# Patient Record
Sex: Male | Born: 1960
Health system: Southern US, Community
[De-identification: ages and names within clinical notes are randomized; demographics above are authoritative.]

## PROBLEM LIST (undated history)

## (undated) DIAGNOSIS — M069 Rheumatoid arthritis, unspecified: Secondary | ICD-10-CM

## (undated) DIAGNOSIS — I519 Heart disease, unspecified: Secondary | ICD-10-CM

## (undated) DIAGNOSIS — K219 Gastro-esophageal reflux disease without esophagitis: Secondary | ICD-10-CM

## (undated) DIAGNOSIS — I739 Peripheral vascular disease, unspecified: Secondary | ICD-10-CM

## (undated) DIAGNOSIS — Z9989 Dependence on other enabling machines and devices: Secondary | ICD-10-CM

## (undated) DIAGNOSIS — I951 Orthostatic hypotension: Secondary | ICD-10-CM

## (undated) DIAGNOSIS — I610 Nontraumatic intracerebral hemorrhage in hemisphere, subcortical: Secondary | ICD-10-CM

## (undated) DIAGNOSIS — R159 Full incontinence of feces: Secondary | ICD-10-CM

## (undated) DIAGNOSIS — N400 Enlarged prostate without lower urinary tract symptoms: Secondary | ICD-10-CM

## (undated) DIAGNOSIS — Z931 Gastrostomy status: Secondary | ICD-10-CM

## (undated) DIAGNOSIS — E46 Unspecified protein-calorie malnutrition: Secondary | ICD-10-CM

## (undated) DIAGNOSIS — J9600 Acute respiratory failure, unspecified whether with hypoxia or hypercapnia: Secondary | ICD-10-CM

## (undated) DIAGNOSIS — E785 Hyperlipidemia, unspecified: Secondary | ICD-10-CM

## (undated) DIAGNOSIS — I10 Essential (primary) hypertension: Secondary | ICD-10-CM

## (undated) DIAGNOSIS — W19XXXA Unspecified fall, initial encounter: Secondary | ICD-10-CM

## (undated) DIAGNOSIS — I82409 Acute embolism and thrombosis of unspecified deep veins of unspecified lower extremity: Secondary | ICD-10-CM

## (undated) DIAGNOSIS — G4733 Obstructive sleep apnea (adult) (pediatric): Secondary | ICD-10-CM

## (undated) DIAGNOSIS — R600 Localized edema: Secondary | ICD-10-CM

## (undated) DIAGNOSIS — Z9119 Patient's noncompliance with other medical treatment and regimen: Secondary | ICD-10-CM

## (undated) DIAGNOSIS — E119 Type 2 diabetes mellitus without complications: Secondary | ICD-10-CM

## (undated) DIAGNOSIS — G459 Transient cerebral ischemic attack, unspecified: Secondary | ICD-10-CM

## (undated) DIAGNOSIS — I779 Disorder of arteries and arterioles, unspecified: Secondary | ICD-10-CM

## (undated) DIAGNOSIS — R296 Repeated falls: Secondary | ICD-10-CM

## (undated) DIAGNOSIS — D529 Folate deficiency anemia, unspecified: Secondary | ICD-10-CM

## (undated) DIAGNOSIS — J156 Pneumonia due to other aerobic Gram-negative bacteria: Secondary | ICD-10-CM

## (undated) DIAGNOSIS — I639 Cerebral infarction, unspecified: Secondary | ICD-10-CM

## (undated) DIAGNOSIS — Z87442 Personal history of urinary calculi: Secondary | ICD-10-CM

## (undated) DIAGNOSIS — Z91199 Patient's noncompliance with other medical treatment and regimen due to unspecified reason: Secondary | ICD-10-CM

## (undated) DIAGNOSIS — G919 Hydrocephalus, unspecified: Secondary | ICD-10-CM

## (undated) DIAGNOSIS — I2510 Atherosclerotic heart disease of native coronary artery without angina pectoris: Secondary | ICD-10-CM

## (undated) DIAGNOSIS — Q211 Atrial septal defect: Secondary | ICD-10-CM

## (undated) DIAGNOSIS — M1990 Unspecified osteoarthritis, unspecified site: Secondary | ICD-10-CM

## (undated) DIAGNOSIS — R32 Unspecified urinary incontinence: Secondary | ICD-10-CM

## (undated) DIAGNOSIS — Q2112 Patent foramen ovale: Secondary | ICD-10-CM

## (undated) HISTORY — DX: Localized edema: R60.0

## (undated) HISTORY — DX: Hydrocephalus, unspecified: G91.9

## (undated) HISTORY — DX: Benign prostatic hyperplasia without lower urinary tract symptoms: N40.0

## (undated) HISTORY — DX: Essential (primary) hypertension: I10

## (undated) HISTORY — DX: Folate deficiency anemia, unspecified: D52.9

## (undated) HISTORY — DX: Unspecified protein-calorie malnutrition: E46

## (undated) HISTORY — DX: Gastro-esophageal reflux disease without esophagitis: K21.9

## (undated) HISTORY — DX: Patient's noncompliance with other medical treatment and regimen due to unspecified reason: Z91.199

## (undated) HISTORY — DX: Atherosclerotic heart disease of native coronary artery without angina pectoris: I25.10

## (undated) HISTORY — DX: Orthostatic hypotension: I95.1

## (undated) HISTORY — DX: Patent foramen ovale: Q21.12

## (undated) HISTORY — DX: Gastrostomy status: Z93.1

## (undated) HISTORY — DX: Cerebral infarction, unspecified: I63.9

## (undated) HISTORY — DX: Peripheral vascular disease, unspecified: I73.9

## (undated) HISTORY — DX: Patient's noncompliance with other medical treatment and regimen: Z91.19

## (undated) HISTORY — DX: Disorder of arteries and arterioles, unspecified: I77.9

## (undated) HISTORY — DX: Pneumonia due to other gram-negative bacteria: J15.6

## (undated) HISTORY — DX: Atrial septal defect: Q21.1

## (undated) HISTORY — DX: Hyperlipidemia, unspecified: E78.5

## (undated) HISTORY — PX: OTHER SURGICAL HISTORY: SHX169

## (undated) HISTORY — DX: Nontraumatic intracerebral hemorrhage in hemisphere, subcortical: I61.0

## (undated) HISTORY — DX: Heart disease, unspecified: I51.9

## (undated) HISTORY — DX: Transient cerebral ischemic attack, unspecified: G45.9

## (undated) HISTORY — DX: Acute respiratory failure, unspecified whether with hypoxia or hypercapnia: J96.00

---

## 1998-08-08 ENCOUNTER — Encounter: Admission: RE | Admit: 1998-08-08 | Discharge: 1998-08-08 | Payer: Self-pay | Admitting: *Deleted

## 1999-05-09 ENCOUNTER — Ambulatory Visit: Admission: RE | Admit: 1999-05-09 | Discharge: 1999-05-09 | Payer: Self-pay | Admitting: Family Medicine

## 1999-05-10 ENCOUNTER — Ambulatory Visit (HOSPITAL_COMMUNITY): Admission: RE | Admit: 1999-05-10 | Discharge: 1999-05-10 | Payer: Self-pay | Admitting: Family Medicine

## 1999-08-11 HISTORY — PX: KNEE ARTHROSCOPY: SUR90

## 1999-09-10 ENCOUNTER — Emergency Department (HOSPITAL_COMMUNITY): Admission: EM | Admit: 1999-09-10 | Discharge: 1999-09-10 | Payer: Self-pay

## 1999-09-10 ENCOUNTER — Encounter: Payer: Self-pay | Admitting: Emergency Medicine

## 1999-09-26 ENCOUNTER — Ambulatory Visit (HOSPITAL_COMMUNITY): Admission: RE | Admit: 1999-09-26 | Discharge: 1999-09-26 | Payer: Self-pay | Admitting: Diagnostic Radiology

## 1999-09-26 ENCOUNTER — Encounter: Payer: Self-pay | Admitting: Family Medicine

## 2000-01-24 ENCOUNTER — Emergency Department (HOSPITAL_COMMUNITY): Admission: EM | Admit: 2000-01-24 | Discharge: 2000-01-25 | Payer: Self-pay | Admitting: Emergency Medicine

## 2000-01-24 ENCOUNTER — Encounter: Payer: Self-pay | Admitting: Emergency Medicine

## 2000-01-25 ENCOUNTER — Encounter: Payer: Self-pay | Admitting: Emergency Medicine

## 2000-02-22 ENCOUNTER — Encounter: Payer: Self-pay | Admitting: Family Medicine

## 2000-02-22 ENCOUNTER — Ambulatory Visit (HOSPITAL_COMMUNITY): Admission: RE | Admit: 2000-02-22 | Discharge: 2000-02-22 | Payer: Self-pay | Admitting: Family Medicine

## 2001-03-17 ENCOUNTER — Encounter: Payer: Self-pay | Admitting: *Deleted

## 2001-03-17 ENCOUNTER — Ambulatory Visit (HOSPITAL_COMMUNITY): Admission: RE | Admit: 2001-03-17 | Discharge: 2001-03-17 | Payer: Self-pay | Admitting: *Deleted

## 2004-01-25 ENCOUNTER — Ambulatory Visit (HOSPITAL_COMMUNITY): Admission: RE | Admit: 2004-01-25 | Discharge: 2004-01-25 | Payer: Self-pay | Admitting: Orthopaedic Surgery

## 2004-01-25 ENCOUNTER — Ambulatory Visit (HOSPITAL_BASED_OUTPATIENT_CLINIC_OR_DEPARTMENT_OTHER): Admission: RE | Admit: 2004-01-25 | Discharge: 2004-01-25 | Payer: Self-pay | Admitting: Orthopaedic Surgery

## 2004-01-26 ENCOUNTER — Emergency Department (HOSPITAL_COMMUNITY): Admission: EM | Admit: 2004-01-26 | Discharge: 2004-01-26 | Payer: Self-pay | Admitting: *Deleted

## 2004-06-13 ENCOUNTER — Observation Stay (HOSPITAL_COMMUNITY): Admission: AD | Admit: 2004-06-13 | Discharge: 2004-06-15 | Payer: Self-pay | Admitting: *Deleted

## 2006-10-01 ENCOUNTER — Emergency Department (HOSPITAL_COMMUNITY): Admission: EM | Admit: 2006-10-01 | Discharge: 2006-10-01 | Payer: Self-pay | Admitting: Emergency Medicine

## 2007-09-02 ENCOUNTER — Emergency Department (HOSPITAL_COMMUNITY): Admission: EM | Admit: 2007-09-02 | Discharge: 2007-09-02 | Payer: Self-pay | Admitting: Emergency Medicine

## 2008-08-06 ENCOUNTER — Emergency Department (HOSPITAL_COMMUNITY): Admission: EM | Admit: 2008-08-06 | Discharge: 2008-08-06 | Payer: Self-pay | Admitting: Emergency Medicine

## 2010-01-11 ENCOUNTER — Encounter: Payer: Self-pay | Admitting: Cardiovascular Disease

## 2010-01-16 ENCOUNTER — Encounter: Payer: Self-pay | Admitting: Cardiovascular Disease

## 2010-01-20 ENCOUNTER — Encounter: Payer: Self-pay | Admitting: Cardiovascular Disease

## 2010-01-23 DIAGNOSIS — R0602 Shortness of breath: Secondary | ICD-10-CM | POA: Insufficient documentation

## 2010-01-23 DIAGNOSIS — E785 Hyperlipidemia, unspecified: Secondary | ICD-10-CM | POA: Insufficient documentation

## 2010-01-23 DIAGNOSIS — R079 Chest pain, unspecified: Secondary | ICD-10-CM | POA: Insufficient documentation

## 2010-01-23 DIAGNOSIS — K219 Gastro-esophageal reflux disease without esophagitis: Secondary | ICD-10-CM | POA: Insufficient documentation

## 2010-01-23 DIAGNOSIS — H538 Other visual disturbances: Secondary | ICD-10-CM | POA: Insufficient documentation

## 2010-01-23 DIAGNOSIS — R6884 Jaw pain: Secondary | ICD-10-CM | POA: Insufficient documentation

## 2010-01-23 DIAGNOSIS — M2550 Pain in unspecified joint: Secondary | ICD-10-CM | POA: Insufficient documentation

## 2010-01-26 ENCOUNTER — Ambulatory Visit: Payer: Self-pay | Admitting: Cardiovascular Disease

## 2010-01-26 DIAGNOSIS — F17200 Nicotine dependence, unspecified, uncomplicated: Secondary | ICD-10-CM | POA: Insufficient documentation

## 2010-01-26 DIAGNOSIS — I2510 Atherosclerotic heart disease of native coronary artery without angina pectoris: Secondary | ICD-10-CM | POA: Insufficient documentation

## 2010-02-07 ENCOUNTER — Telehealth (INDEPENDENT_AMBULATORY_CARE_PROVIDER_SITE_OTHER): Payer: Self-pay | Admitting: *Deleted

## 2010-02-07 DIAGNOSIS — I639 Cerebral infarction, unspecified: Secondary | ICD-10-CM

## 2010-02-07 HISTORY — DX: Cerebral infarction, unspecified: I63.9

## 2010-02-08 ENCOUNTER — Ambulatory Visit: Payer: Self-pay | Admitting: Cardiovascular Disease

## 2010-02-08 ENCOUNTER — Ambulatory Visit: Payer: Self-pay | Admitting: Cardiology

## 2010-02-08 ENCOUNTER — Ambulatory Visit: Payer: Self-pay

## 2010-02-08 ENCOUNTER — Encounter (HOSPITAL_COMMUNITY): Admission: RE | Admit: 2010-02-08 | Discharge: 2010-04-11 | Payer: Self-pay | Admitting: Cardiovascular Disease

## 2010-02-08 ENCOUNTER — Encounter: Payer: Self-pay | Admitting: Cardiovascular Disease

## 2010-02-08 ENCOUNTER — Ambulatory Visit (HOSPITAL_COMMUNITY): Admission: RE | Admit: 2010-02-08 | Discharge: 2010-02-08 | Payer: Self-pay | Admitting: Cardiovascular Disease

## 2010-02-08 DIAGNOSIS — Z8673 Personal history of transient ischemic attack (TIA), and cerebral infarction without residual deficits: Secondary | ICD-10-CM | POA: Insufficient documentation

## 2010-02-09 ENCOUNTER — Encounter: Admission: RE | Admit: 2010-02-09 | Discharge: 2010-02-09 | Payer: Self-pay | Admitting: Family Medicine

## 2010-02-28 ENCOUNTER — Encounter: Admission: RE | Admit: 2010-02-28 | Discharge: 2010-04-19 | Payer: Self-pay | Admitting: Family Medicine

## 2010-03-13 ENCOUNTER — Ambulatory Visit: Payer: Self-pay | Admitting: Cardiovascular Disease

## 2010-03-13 DIAGNOSIS — Q211 Atrial septal defect: Secondary | ICD-10-CM | POA: Insufficient documentation

## 2010-08-25 ENCOUNTER — Encounter: Payer: Self-pay | Admitting: Cardiovascular Disease

## 2010-09-13 ENCOUNTER — Ambulatory Visit: Payer: Self-pay | Admitting: Cardiovascular Disease

## 2010-09-21 ENCOUNTER — Inpatient Hospital Stay (HOSPITAL_BASED_OUTPATIENT_CLINIC_OR_DEPARTMENT_OTHER): Admission: RE | Admit: 2010-09-21 | Discharge: 2010-09-21 | Payer: Self-pay | Admitting: Cardiovascular Disease

## 2010-09-21 ENCOUNTER — Ambulatory Visit: Payer: Self-pay | Admitting: Cardiovascular Disease

## 2010-10-06 ENCOUNTER — Telehealth: Payer: Self-pay | Admitting: Cardiovascular Disease

## 2010-10-09 ENCOUNTER — Ambulatory Visit: Payer: Self-pay | Admitting: Cardiovascular Disease

## 2010-10-10 LAB — CONVERTED CEMR LAB
Basophils Relative: 0.4 % (ref 0.0–3.0)
CO2: 29 meq/L (ref 19–32)
Chloride: 97 meq/L (ref 96–112)
Eosinophils Absolute: 0.3 10*3/uL (ref 0.0–0.7)
Hemoglobin: 14.6 g/dL (ref 13.0–17.0)
Lymphocytes Relative: 20 % (ref 12.0–46.0)
MCHC: 34.4 g/dL (ref 30.0–36.0)
MCV: 89.9 fL (ref 78.0–100.0)
Monocytes Absolute: 0.5 10*3/uL (ref 0.1–1.0)
Neutro Abs: 6.6 10*3/uL (ref 1.4–7.7)
RBC: 4.73 M/uL (ref 4.22–5.81)
Sodium: 142 meq/L (ref 135–145)

## 2010-10-13 ENCOUNTER — Ambulatory Visit (HOSPITAL_COMMUNITY): Admission: RE | Admit: 2010-10-13 | Discharge: 2010-10-14 | Payer: Self-pay | Admitting: Cardiovascular Disease

## 2010-10-13 ENCOUNTER — Ambulatory Visit: Payer: Self-pay | Admitting: Cardiovascular Disease

## 2010-10-17 ENCOUNTER — Ambulatory Visit: Payer: Self-pay | Admitting: Cardiovascular Disease

## 2010-10-17 DIAGNOSIS — E876 Hypokalemia: Secondary | ICD-10-CM | POA: Insufficient documentation

## 2010-10-17 LAB — CONVERTED CEMR LAB
CO2: 30 meq/L (ref 19–32)
Calcium: 9.4 mg/dL (ref 8.4–10.5)
Chloride: 104 meq/L (ref 96–112)
Glucose, Bld: 101 mg/dL — ABNORMAL HIGH (ref 70–99)
Sodium: 142 meq/L (ref 135–145)

## 2010-10-26 ENCOUNTER — Ambulatory Visit: Payer: Self-pay | Admitting: Cardiovascular Disease

## 2010-12-10 HISTORY — PX: CORONARY ANGIOPLASTY WITH STENT PLACEMENT: SHX49

## 2011-01-07 LAB — CONVERTED CEMR LAB
Basophils Relative: 0.4 % (ref 0.0–3.0)
Calcium: 9.6 mg/dL (ref 8.4–10.5)
Chloride: 104 meq/L (ref 96–112)
Creatinine, Ser: 1.1 mg/dL (ref 0.4–1.5)
Eosinophils Relative: 1.6 % (ref 0.0–5.0)
INR: 1.1 — ABNORMAL HIGH (ref 0.8–1.0)
Lymphocytes Relative: 21.8 % (ref 12.0–46.0)
Neutrophils Relative %: 71.5 % (ref 43.0–77.0)
RBC: 4.7 M/uL (ref 4.22–5.81)
WBC: 10.6 10*3/uL — ABNORMAL HIGH (ref 4.5–10.5)

## 2011-01-09 NOTE — Letter (Signed)
Summary: Cardiac Catheterization Instructions- [facility]  [employer], Main Office  [address]   [place], [place] [address]   Phone: [phone]  Fax: [phone]     09/13/2010 MRN: [id number]  Tony Davis [address] [place], [place]  [address]  Dear [name],   You are scheduled for a Cardiac Catheterization on 10/13/11_with Dr. Clifton James.  Please arrive to the 1st floor of the Heart and Vascular Center at [facility] at 6:30am on the day of your procedure. Please do not arrive before 6:30 a.m. Call the Heart and Vascular Center at [phone] if you are unable to make your appointmnet. The Code to get into the parking garage under the building is 0200. Take the elevators to the 1st floor. You must have someone to drive you home. Someone must be with you for the first 24 hours after you arrive home. Please wear clothes that are easy to get on and off and wear slip-on shoes. Do not eat or drink after midnight except water with your medications that morning. Bring all your medications and current insurance cards with you.    __X_ Make sure you take your aspirin.  __X_ You may take ALL of your medications with water that morning. ________________________________________________________________________________________________________________________________  __X_ Eilene Ghazi instructions:  PLEASE TAKE 3 PILLS OF PREDNISONE AT NOON THE DAY BEFORE YOUR PROCEDURE AND TAKE 3 MORE THE EVENING BEFORE YOUR PROCEDURE. ALSO TAKE 3 MORE PILLS THE MORNING OF THE PROCEDURE.  The usual length of stay after your procedure is 2 to 3 hours. This can vary.  If you have any questions, please call the office at the number listed above.   Whitney Maeola Sarah RN

## 2011-01-09 NOTE — Assessment & Plan Note (Signed)
Summary: eph/wpa   Visit Type:  Follow-up Primary Provider:  Noel Journey  CC:  no complaints.  History of Present Illness: 50 yo male with history of CAD, HTN, hyperlipidemia, impaired fasting glucose and obesity who is here today for follow up. I saw him in February 2011 for complaints of chest pain.   I arranged a stress test, an echo and carotid dopplers at the first visit. He missed his follow up appt. The carotid dopplers were normal. He came in for his stress test and complained of neurological changes. He was seen by Dr. Riley Kill and the stress test was cancelled. He was sent over to Dr. Stones office and had MRI/MRA of the head and neck. MRA showed no occlusive disease. MRI showed non-specific white matter changes but no clear infarct, possibly related to hypertension. His echo shows septal and inferobasal hypokinesis with mild concentric hypertrophy, EF 45-50%. There was a possible small PFO.   He was seen here last month and was still having chest pains. I arranged a left heart cath in the outpatient lab. He was found to have a stenosis in the mid Circumflex. He told us about dark stools the day before the cath. The intervention was delayed pending a GI workup. He has had no further issues with dark stools at follow up 2 weeks ago. He was brought back in 10/13/10 for planned PCI of the Circumflex. A Promus drug eluting stent was placed without any complications.  He is doing great. No chest pain-completely resolved. No SOB, dizziness. His right arm feels ok at the cath site.   Current Medications (verified): 1)  Losartan Potassium 100 Mg Tabs (Losartan Potassium) .Marland Kitchen.. 1 Tab Once Daily 2)  Flomax 0.4 Mg Caps (Tamsulosin Hcl) .Marland Kitchen.. 1 Cap Once Daily 3)  Nitrostat 0.4 Mg Subl (Nitroglycerin) .Marland Kitchen.. 1 Tablet Under Tongue At Onset of Chest Pain; You May Repeat Every 5 Minutes For Up To 3 Doses. 4)  Amlodipine Besylate 5 Mg Tabs (Amlodipine Besylate) .... Take One Tablet By Mouth Daily 5)  Aspirin  Ec 325 Mg Tbec (Aspirin) .... Take One Tablet By Mouth Daily 6)  Simvastatin 20 Mg Tabs (Simvastatin) .... Take One Tablet By Mouth Daily At Bedtime 7)  Plavix 75 Mg Tabs (Clopidogrel Bisulfate) .... Take One Tablet By Mouth Daily 8)  Potassium Chloride Crys Cr 20 Meq Cr-Tabs (Potassium Chloride Crys Cr) .... Take One Tablet By Mouth Two Times A Day. 9)  Metoprolol Tartrate 25 Mg Tabs (Metoprolol Tartrate) .... Take One Tablet By Mouth Twice A Day  Allergies: 1)  ! Iodine  Past History:  Past Medical History: CAD with  cath 10/11 with mild LAD, 80% mid Circ, PLA 70%.  No s/p Promus DES Circumflex artery 10/13/10.  GERD (ICD-530.81) HYPERLIPIDEMIA (ICD-272.4) HYPERTENSION (ICD-401.9) Borderline DM Tobacco abuse CVA/TIA 3/11        Social History: Reviewed history from 09/13/2010 and no changes required. Tobacco Use - Yes. 1/2 ppd for 30 years. 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  Review of Systems  The patient denies fatigue, malaise, fever, weight gain/loss, vision loss, decreased hearing, hoarseness, chest pain, palpitations, shortness of breath, prolonged cough, wheezing, sleep apnea, coughing up blood, abdominal pain, blood in stool, nausea, vomiting, diarrhea, heartburn, incontinence, blood in urine, muscle weakness, joint pain, leg swelling, rash, skin lesions, headache, fainting, dizziness, depression, anxiety, enlarged lymph nodes, easy bruising  or bleeding, and environmental allergies.    Vital Signs:  Patient profile:   50 year old male Height:      71 inches Weight:      237 pounds Pulse rate:   84 / minute Pulse rhythm:   regular BP sitting:   132 / 100  (left arm)  Vitals Entered By: Jacquelin Hawking, CMA (October 26, 2010 1:47 PM)  Physical Exam  General:  General: Well developed, well nourished, NAD Musculoskeletal: Muscle strength 5/5 all ext Psychiatric: Mood and  affect normal Neck: No JVD, no carotid bruits, no thyromegaly, no lymphadenopathy. Lungs:Clear bilaterally, no wheezes, rhonci, crackles CV: RRR no murmurs, gallops rubs Abdomen: soft, NT, ND, BS present Extremities: No edema, pulses 2+.    Cardiac Cath  Procedure date:  09/21/2010  Findings:      1. Left main coronary artery had no evidence of disease. 2. The left anterior descending was large vessel that coursed around     the anterior wall and did not wrap around the apex.  There appeared     to be mild plaque disease in this vessel.  There was a moderate-     sized diagonal branch with mild plaque disease. 3. Circumflex artery gave off an early marginal branch that     represented an intermediate branch.  There was no disease in this     vessel.  The next marginal branch was relatively small in caliber     and had no disease.  The second obtuse marginal branch was moderate-     sized and had 30% plaque.  After the takeoff of the second marginal     branch, there was 80% hazy stenosis in the mid circumflex vessel.     Beyond this, there was third obtuse marginal branch with plaque     disease. 4. The right coronary artery is a large dominant vessel that wraps     around the apex of the left ventricle.  There is mild 25% stenosis     in the proximal midportion of the vessel.  The posterior descending     artery is a large-caliber vessel with mild plaque disease.  The     posterolateral branch has a proximal 70% stenosis and the mid 40%     stenosis.  This is unchanged from the catheterization in 2005. 5. Left ventricular angiogram was performed in the RAO projection,     which showed normal left ventricular systolic function with     ejection fraction of 60%.  PCI 10/13/10 with 3.5 x 15 mm Promus DES Circumflex. Post-dilatation with 3.75 mm [place] balloon.   Impression & Recommendations:  Problem # 1:  CAD, NATIVE VESSEL (ICD-414.01) Stable post drug eluting stent in  Circumflex artery. Continue ASA/Plavix for at least one year. Continue beta blocker and statin.   His updated medication list for this problem includes:    Nitrostat 0.4 Mg Subl (Nitroglycerin) .Marland Kitchen... 1 tablet under tongue at onset of chest pain; you may repeat every 5 minutes for up to 3 doses.    Amlodipine Besylate 5 Mg Tabs (Amlodipine besylate) .Marland Kitchen... Take one tablet by mouth daily    Aspirin Ec 325 Mg Tbec (Aspirin) .Marland Kitchen... Take one tablet by mouth daily    Plavix 75 Mg Tabs (Clopidogrel bisulfate) .Marland Kitchen... Take one tablet by mouth daily    Metoprolol Tartrate 25 Mg Tabs (Metoprolol tartrate) .Marland Kitchen... Take one tablet by mouth twice a day  Patient Instructions: 1)  Your physician  recommends that you schedule a follow-up appointment in: 6 months 2)  Your physician recommends that you continue on your current medications as directed. Please refer to the Current Medication list given to you today.

## 2011-01-09 NOTE — Progress Notes (Signed)
Summary: Family Medicine Med List  Family Medicine Med List   Imported By: Roderic Ovens 04/03/2010 13:58:22  _____________________________________________________________________  External Attachment:    Type:   Image     Comment:   External Document

## 2011-01-09 NOTE — Assessment & Plan Note (Signed)
Summary: possible TIA symptom   Visit Type:  DOD ADD-ON Primary Provider:  Noel Journey  CC:  pt had nuc study today and was added on to Dr. Louretta Shorten Schedule for stroke changes...visual changes and dizziness.  History of Present Illness: The patient was scheduled by Dr. Clifton James to have a nuclear and echo today.  On "Sunday, the patient was  on the way to [place], and he noticed as his wife was driving, he lost vision in one field.  He could not see in the field.  He could not read the car tags.  Things were blank.  This lasted about twenty to thirty minutes.  He got to [employer], and felt disoriented.  He could not see them until they came in from the left side.  Then he felt disoriented after a while.  Now he feels better.  BP was elevated and he was placed on medication with Dr. Stone.  His chest pain is improved, and his BP has been down quite a bit.     Current Medications (verified): 1)  Losartan Potassium 100 Mg Tabs (Losartan Potassium) .... 1 Tab Once Daily 2)  Flomax 0.4 Mg Caps (Tamsulosin Hcl) .... 1 Cap Once Daily 3)  Nitrostat 0.4 Mg Subl (Nitroglycerin) .... 1 Tablet Under Tongue At Onset of Chest Pain; You May Repeat Every 5 Minutes For Up To 3 Doses. 4)  Amlodipine Besylate 10 Mg Tabs (Amlodipine Besylate) .... 1 Tab Once Daily 5)  Aspirin 81 Mg Tbec (Aspirin) .... Take One Tablet By Mouth Daily  Allergies: 1)  ! Iodine  Vital Signs:  Patient profile:   50 year old male Height:      71 inches Weight:      236 pounds BMI:     33" .03 Pulse rate:   100 / minute Pulse rhythm:   regular BP sitting:   110 / 90  (left arm) Cuff size:   large  Vitals Entered By: Danielle Rankin, CMA (February 08, 2010 4:18 PM)  Physical Exam  General:  Well developed, well nourished, in no acute distress. Head:  normocephalic and atraumatic Eyes:  PERRLA/EOM intact; conjunctiva and lids normal. Nose:  no deformity, discharge, inflammation, or lesions Lungs:  Clear bilaterally to auscultation  and percussion. Heart:  PMI non displaced.  Normal S1 and S2.  Pos S4.  No def murmur. Abdomen:  Bowel sounds positive; abdomen soft and non-tender without masses, organomegaly, or hernias noted. No hepatosplenomegaly. Msk:  Back normal, normal gait. Muscle strength and tone normal. Pulses:  pulses normal in all 4 extremities Extremities:  No clubbing or cyanosis. Neurologic:  No def defect with field testing.  CN intact. Weakness in hand 3/5.  Mod drift demonstrated to son and wife (L hand).  Slight reduce sensation L arm and leg.  Clumsiness with Finger to nose with left hand.   Skin:  Intact without lesions or rashes.   EKG  Procedure date:  02/08/2010  Findings:      NSR. WNL.    Impression & Recommendations:  Problem # 1:  CVA (ICD-434.91) suspect R brain stroke.  Had left field cut, resolved, and then left weakness, drift on exam;  spoke with Dr. Ace Gins, and they will arrange an OP MRI tomorrow and decide on dosing of meds.  Carotids done today--prelim--with critical disease, and echo pending.  She will arrange MRI, follow up office visit, adjust ASA appropriately based on findings.    Defer cardiac workup with followup with Dr.  Mcalhany next week.  Instructed not to drive until above issues resolved and addressed.  May need neuro eval. His updated medication list for this problem includes:    Aspirin 81 Mg Tbec (Aspirin) .Marland Kitchen... Take one tablet by mouth daily  Problem # 2:  HYPERTENSION (ICD-401.9) BP seems quite low   --?? watershed CVA----therefore dose decreased to amlodipine 5mg  daily until he sees Dr. Larina Bras back.   His updated medication list for this problem includes:    Losartan Potassium 100 Mg Tabs (Losartan potassium) .Marland Kitchen... 1 tab once daily    Amlodipine Besylate 5 Mg Tabs (Amlodipine besylate) .Marland Kitchen... Take one tablet by mouth daily    Aspirin 81 Mg Tbec (Aspirin) .Marland Kitchen... Take one tablet by mouth daily  Problem # 3:  CHEST PAIN (ICD-786.50) Needs workup.  Will defer  until MRI done, and have him follow up with Dr. Clifton James.  Dr. Larina Bras called. His updated medication list for this problem includes:    Nitrostat 0.4 Mg Subl (Nitroglycerin) .Marland Kitchen... 1 tablet under tongue at onset of chest pain; you may repeat every 5 minutes for up to 3 doses.    Amlodipine Besylate 5 Mg Tabs (Amlodipine besylate) .Marland Kitchen... Take one tablet by mouth daily    Aspirin 81 Mg Tbec (Aspirin) .Marland Kitchen... Take one tablet by mouth daily  Problem # 4:  HYPERLIPIDEMIA (ICD-272.4) Prob should be on statin.  See 2005 cath study.  Problem # 5:  TOBACCO ABUSE (ICD-305.1) should stop.  Patient Instructions: 1)  Dr Evon Slack office will call your home tomorrow morning at 8:00 AM to arrange an office visit and further testing.   2)  Do Not Drive. 3)  Your physician recommends that you schedule a follow-up appointment in: 1 WEEK with Dr Clifton James 4)  Your physician has recommended you make the following change in your medication: DECREASE Amlodipine to 5mg  once a day Prescriptions: AMLODIPINE BESYLATE 5 MG TABS (AMLODIPINE BESYLATE) Take one tablet by mouth daily  #30 x 2   Entered by:   Julieta Gutting, RN, BSN   Authorized by:   Ronaldo Miyamoto, MD, [facility]   Signed by:   Julieta Gutting, RN, BSN on 02/08/2010   Method used:   Electronically to        [id number] (ret[facility])       [address]       [place], [place]  [address]       Ph: [phone]       Fax: [phone]   RxID:   [id number]

## 2011-01-09 NOTE — Letter (Signed)
Summary: Family Medicine at [facility] Note  Family Medicine at [facility] Note   Imported By: Roderic Ovens 04/03/2010 14:01:28  _____________________________________________________________________  External Attachment:    Type:   Image     Comment:   External Document

## 2011-01-09 NOTE — Miscellaneous (Signed)
Summary: Orders Update  Clinical Lists Changes  Orders: Added new Test order of Carotid Duplex (Carotid Duplex) - Signed 

## 2011-01-09 NOTE — Assessment & Plan Note (Signed)
Summary: rov/abnormal echo/pla   Visit Type:  Follow-up Primary Provider:  Noel Journey  CC:  sluggish/fatigue..pt c/o back pain between scalpulas says this is everyday...denis any sob or edema.  History of Present Illness: 50 yo male with history of non-obstrucitve CAD by cath 2005, HTN, hyperlipidemia, impaired fasting glucose and obesity who is here today for follow up. I saw him in February 2011 for complaints of chest pain.   It was described as a heavy pressure in the center of his chest with exertion, associated with SOB. Localized mostly to the center of his chest without radiation. Lasted for several minutes. I arranged a stress test, an echo and carotid dopplers at the first visit. He missed his follow up appt. The carotid dopplers were normal. He came in for his stress test and complained of neurological changes. He was seen by Dr. Riley Kill and the stress test was cancelled. He was sent over to Dr. Stones office and had MRI/MRA of the head and neck. MRA showed no occlusive disease. MRI showed non-specific white matter changes but no clear infarct, possibly related to hypertension. His echo shows septal and inferobasal hypokinesis with mild concentric hypertrophy, EF 45-50%. There is a possible small PFO.   He tells me today that he has had continued episodes of upper left back pain.The pain in his back lasts for 4-6 hours and is worsened by movement and is painful to touch.  He has occasional "heaviness" in his chest but this is not exertional. No exertional dyspnea. He is currently receiving PT post TIA/CVA and is recovering his strength. His BP has been well controlled. No new neurological changes.   Current Medications (verified): 1)  Losartan Potassium 100 Mg Tabs (Losartan Potassium) .Marland Kitchen.. 1 Tab Once Daily 2)  Flomax 0.4 Mg Caps (Tamsulosin Hcl) .Marland Kitchen.. 1 Cap Once Daily 3)  Nitrostat 0.4 Mg Subl (Nitroglycerin) .Marland Kitchen.. 1 Tablet Under Tongue At Onset of Chest Pain; You May Repeat Every 5  Minutes For Up To 3 Doses. 4)  Amlodipine Besylate 5 Mg Tabs (Amlodipine Besylate) .... Take One Tablet By Mouth Daily 5)  Aspirin Ec 325 Mg Tbec (Aspirin) .... Take One Tablet By Mouth Daily 6)  Simvastatin 40 Mg Tabs (Simvastatin) .Marland Kitchen.. 1 Tab Once Daily  Allergies: 1)  ! Iodine  Past History:  Past Medical History: CAD with cath 6/05 showing mild non-obstrucitve disease.  GERD (ICD-530.81) HYPERLIPIDEMIA (ICD-272.4) HYPERTENSION (ICD-401.9) Borderline DM Tobacco abuse CVA/TIA 3/11        Social History: Reviewed history from 01/26/2010 and no changes required. Tobacco Use - Yes. 1ppd for 30 years. 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  Review of Systems       The patient complains of fatigue, muscle weakness, joint pain, and chest pain.  The patient denies malaise, fever, weight gain/loss, vision loss, decreased hearing, hoarseness, palpitations, shortness of breath, prolonged cough, wheezing, sleep apnea, coughing up blood, abdominal pain, blood in stool, nausea, vomiting, diarrhea, heartburn, incontinence, blood in urine, leg swelling, rash, skin lesions, headache, fainting, dizziness, depression, anxiety, enlarged lymph nodes, easy bruising or bleeding, and environmental allergies.    Vital Signs:  Patient profile:   50 year old male Height:      71 inches Weight:      236 pounds BMI:     33.03 Pulse rate:   86 / minute Pulse rhythm:   regular  BP sitting:   130 / 100  (left arm) Cuff size:   large  Vitals Entered By: Danielle Rankin, CMA (March 13, 2010 10:08 AM)  Physical Exam  General:  General: Well developed, well nourished, NAD HEENT: OP clear, mucus membranes moist Neuro: No focal deficits Musculoskeletal: Muscle strength 5/5 all ext Psychiatric: Mood and affect normal Neck: No JVD, no carotid bruits, no thyromegaly, no lymphadenopathy. Lungs:Clear bilaterally, no  wheezes, rhonci, crackles CV: RRR no murmurs, gallops rubs Abdomen: soft, NT, ND, BS present Extremities: No edema, pulses 2+.    Echocardiogram  Procedure date:  02/08/2010  Findings:      Left ventricle: Septal and inferobasal hypokinesis The cavity size       was normal. There was mild concentric hypertrophy. Systolic       function was mildly reduced. The estimated ejection fraction was       in the range of 45% to 50%. Wall motion was normal; there were no       regional wall motion abnormalities. Features are consistent with a       pseudonormal left ventricular filling pattern, with concomitant       abnormal relaxation and increased filling pressure (grade 2       diastolic dysfunction).     - Mitral valve: Mild regurgitation.     - Left atrium: The atrium was mildly dilated.     - Atrial septum: There was a patent foramen ovale.  EKG  Procedure date:  03/13/2010  Findings:      NSR, rate 86 bpm. Normal EKG.  MRI Brain  Procedure date:  02/09/2010  Findings:       1.  Remote small ischemic infarct involving the right caudate head   with hemorrhagic products.   2.  Non specific subcortical white matter changes supratentorially.   These could represent areas of ischemic gliosis related to  small   vessel disease due to hypertension or diabetes versus a   demyelinating process or vasculitis. Clinical correlation is   suggested.   3.  Moderate inflammatory mucosal thickening in the paranasal   sinuses.   4.  Incidental note made of physiologic mineralization of the   globus paladi.  MRI EXAM  Procedure date:  02/09/2010  Findings:       1.  No occlusions, dissections or aneurysms noted on the study.   2.  Mild caliber irregularity of the right middle cerebral artery   may represent intracranial arteriosclerosis.  Vasculitis may have a   similar appearance.   3.  Neither of the posterior inferior cerebellar arteries is   identified and  Impression &  Recommendations:  Problem # 1:  CAD, NATIVE VESSEL (ICD-414.01) His chest pain sounds mostly atypical and is not related to exertion. He does have wall motion abnormalities on his echo with slight decrease in overall LV function. I will delay further ischemic evaluation until he has completed his physical therapy. May consider cath to get definitive diagnosis.   His updated medication list for this problem includes:    Nitrostat 0.4 Mg Subl (Nitroglycerin) .Marland Kitchen... 1 tablet under tongue at onset of chest pain; you may repeat every 5 minutes for up to 3 doses.    Amlodipine Besylate 5 Mg Tabs (Amlodipine besylate) .Marland Kitchen... Take one tablet by mouth daily    Aspirin Ec 325 Mg Tbec (Aspirin) .Marland Kitchen... Take one tablet by mouth daily  Problem # 2:  HYPERTENSION (ICD-401.9) Seems to be better controlled. Continue  current therapy.   His updated medication list for this problem includes:    Losartan Potassium 100 Mg Tabs (Losartan potassium) .Marland Kitchen... 1 tab once daily    Amlodipine Besylate 5 Mg Tabs (Amlodipine besylate) .Marland Kitchen... Take one tablet by mouth daily    Aspirin Ec 325 Mg Tbec (Aspirin) .Marland Kitchen... Take one tablet by mouth daily  Problem # 3:  PATENT FORAMEN OVALE (ICD-745.5) It is unclear if this is related in any way to his neurological event. It is suspected that his neurological event was related to his hypertension, however, there is the possibiliity that the PFO could be related. Will discuss with Dr. Excell Seltzer in regards to possible closure. I will discuss further with the patient at the next visit.   Patient Instructions: 1)  Your physician recommends that you schedule a follow-up appointment in: 3 months 2)  Your physician recommends that you continue on your current medications as directed. Please refer to the Current Medication list given to you today.

## 2011-01-09 NOTE — Letter (Signed)
Summary: [facility] - Cardiac Rehab Program  [facility] - Cardiac Rehab [facility]   Imported By: Marylou Mccoy 11/06/2010 11:26:40  _____________________________________________________________________  External Attachment:    Type:   Image     Comment:   External Document

## 2011-01-09 NOTE — Letter (Signed)
Summary: Cardiac Catheterization Instructions- Main Lab  [employer], Main Office  [address]   [place], [place] [address]   Phone: [phone]  Fax: [phone]     10/09/2010 MRN: [id number]  Tony Davis [address] [place], [place]  [address]  Dear Mr. Sahlin,   You are scheduled for Cardiac Catheterization on 10/13/10  with Dr. Clifton James.  Please arrive at the [facility] at 5:30      a.m. on the day of your procedure.  1. DIET     _X__ Nothing to eat or drink after midnight except your medications with a sip of water.   2 MAKE SURE YOU TAKE YOUR ASPIRIN.      ___X YOU MAY TAKE ALL of your remaining medications with a small amount of water.      _X__ START NEW medications:          ____ Pre-med instructions:       Take 60mg  of Prednisone at noon the day before, 60mg  the evening before, and 60mg  the morning of the procedure.  3 Plan for one night stay - bring personal belongings (i.e. toothpaste, toothbrush, etc.)  4 Bring a current list of your medications and current insurance cards.  5 Must have a responsible person to drive you home.   6 Someone must be with yu for the first 24 hours after you arrive home.  7 Please wear clothes that are easy to get on and off and wear slip-on shoes.  *Special note: Every effort is made to have your procedure done on time.  Occasionally there are emergencies that present themselves at the hospital that may cause delays.  Please be patient if a delay does occur.  If you have any questions after you get home, please call the office at the number listed above.  Whitney Maeola Sarah RN

## 2011-01-09 NOTE — Assessment & Plan Note (Signed)
Summary: np6/htn/chest pain   Visit Type:  new pt visit Primary Provider:  Noel Journey  CC:  chest pain...sob.Marland Kitchen  History of Present Illness: 50 yo male with history of non-obstrucitve CAD by cath 2005, HTN, hyperlipidemia, impaired fasting glucose and obesity who is referred today for further evaluation of chest pain. He had been previously followed in our office but has not been seen in the last five years. He was recently seen as a new pt by Dr. Larina Bras and was referred to [place] today. He tells me that he has had no chest pain until one month ago. It is described as a heavy pressure in the center of his chest with exertion, associated with SOB. Localized mostly to the center of his chest without radiation. Lasts for several minutes. Sometimes he has neck pain. He also reports being easily fatigued. He is limited from exercise mostly because of his knee pain. He tells me that several months ago, he became confused and presented to the ED where he was found to have high BP. He has since been started on anti-hypertensive therapy. He has a long history of medical non-compliance and continues to smoke 1ppd.   Current Medications (verified): 1)  Losartan Potassium 100 Mg Tabs (Losartan Potassium) .Marland Kitchen.. 1 Tab Once Daily 2)  Flomax 0.4 Mg Caps (Tamsulosin Hcl) .Marland Kitchen.. 1 Cap Once Daily 3)  Nitrostat 0.4 Mg Subl (Nitroglycerin) .Marland Kitchen.. 1 Tablet Under Tongue At Onset of Chest Pain; You May Repeat Every 5 Minutes For Up To 3 Doses. 4)  Amlodipine Besylate 10 Mg Tabs (Amlodipine Besylate) .Marland Kitchen.. 1 Tab Once Daily  Allergies: 1)  ! Iodine  Past History:  Past Medical History: CAD with cath 6/05 showing mild non-obstrucitve disease.  GERD (ICD-530.81) HYPERLIPIDEMIA (ICD-272.4) HYPERTENSION (ICD-401.9) Borderline DM       Past Surgical History: Knee Arthroscopy, 2 on left and 3 on right knee  Family History: Father alive, CAD, CABG and CVA Mother: alive, healthy Sister: alive, CAD early 36s  Social  History: Tobacco Use - Yes. 1ppd for 30 years. 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  Review of Systems       The patient complains of chest pain, shortness of breath, and joint pain.  The patient denies fatigue, malaise, fever, weight gain/loss, vision loss, decreased hearing, hoarseness, palpitations, prolonged cough, wheezing, sleep apnea, coughing up blood, abdominal pain, blood in stool, nausea, vomiting, diarrhea, heartburn, incontinence, blood in urine, muscle weakness, leg swelling, rash, skin lesions, headache, fainting, dizziness, depression, anxiety, enlarged lymph nodes, easy bruising or bleeding, and environmental allergies.    Vital Signs:  Patient profile:   50 year old male Height:      71 inches Weight:      240 pounds BMI:     33.59 Pulse rate:   87 / minute Pulse rhythm:   regular BP sitting:   114 / 84  (left arm) Cuff size:   large  Vitals Entered By: Danielle Rankin, CMA (January 26, 2010 11:11 AM)  Physical Exam  General:  General: Well developed, well nourished, NAD HEENT: OP clear, mucus membranes moist SKIN: warm, dry Neuro: No focal deficits Musculoskeletal: Muscle strength 5/5 all ext Psychiatric: Mood and affect normal Neck: No JVD, no carotid bruits, no thyromegaly, no lymphadenopathy. Lungs:Clear bilaterally, no wheezes, rhonci, crackles CV: RRR no murmurs, gallops rubs Abdomen: soft, NT, ND, BS present Extremities:  No edema, pulses 2+.    Cardiac Cath  Procedure date:  06/14/2004  Findings:      HEMODYNAMIC DATA:  Left ventricular pressure 136/18.  Aortic pressure  136/94.  There is no aortic valve gradient.   VENTRICULOGRAPHIC DATA:  Left Ventriculogram:  Wall motion appears normal.  Ejection fraction is estimated at greater than or equal to 55%.  It could  not be precisely quantitated due to ventricular ectopy.  There was no  significant mitral  regurgitation.   Abdominal Aortogram:  The abdominal aortogram reveals normal abdominal  aorta, renal arteries and iliac arteries.   ARTERIOGRAPHIC DATA:  Coronary Arteriography (Right Dominant)  Left Main:  The left main is normal.   Left Anterior Descending Artery:  The left anterior descending artery has  mild ectatic changes.  It gives rise to normal size diagonal branch.  The  diagonal branch has a 30% stenosis proximally and a 40% stenosis in the mid  branch.   Left Circumflex:  The left circumflex had mild diffuse irregularities.  The  circumflex gives rise to a normal size ramus intermedius and three normal  size obtuse marginal branches.  There were diffuse luminal irregularities in  the distal circumflex and in the second obtuse marginal branch.   Right Coronary Artery:  The right coronary artery has mild diffuse ectasia.  There is a 20% stenosis in the proximal vessel and diffuse 25% in the  midvessel.  The distal right coronary artery gives rise to a large posterior  descending artery.  There is a tubular 50% stenosis in the distal portion of  the posterior descending artery.  In the AV groove portion of the distal  right coronary artery there is a 70% followed by a 30% stenosis.  Beyond  this the vessel gives rise to a very small posterolateral branch.   IMPRESSION:  1. Normal left ventricular systolic function.  2. Mild-to-moderate coronary artery disease, which does not appear to     obstructive or hemodynamically significant in nature.   EKG  Procedure date:  01/26/2010  Findings:      NSR, rate 87 bpm. Normal EKG  Impression & Recommendations:  Problem # 1:  CAD, NATIVE VESSEL (ICD-414.01) He had mild non-obstructive disease by cath 2005 but has continued to smoke daily and has not been taking his medications. His blood pressure has been uncontrolled until recently when Dr. Larina Bras began to see him and got him back on medications. It is well controlled today.  His chest pain has typical and atypical features but given his history and risk factors, I think it is reasonable to arrange an exercise stress myoview to assess for ischemia. I will also arrange an echo to assess his LV size and function.  He has SL NTG to take if needed. He is to call EMS if he has recurrent chest pain that persists or does not respond to NTG.   His updated medication list for this problem includes:    Nitrostat 0.4 Mg Subl (Nitroglycerin) .Marland Kitchen... 1 tablet under tongue at onset of chest pain; you may repeat every 5 minutes for up to 3 doses.    Amlodipine Besylate 10 Mg Tabs (Amlodipine besylate) .Marland Kitchen... 1 tab once daily  Problem # 2:  CHEST PAIN (ICD-786.50) As above.   His updated medication list for this problem includes:    Nitrostat 0.4 Mg Subl (Nitroglycerin) .Marland Kitchen... 1 tablet under tongue at onset of chest pain; you may repeat every 5 minutes for up to  3 doses.    Amlodipine Besylate 10 Mg Tabs (Amlodipine besylate) .Marland Kitchen... 1 tab once daily  Orders: Nuclear Stress Test (Nuc Stress Test) Echocardiogram (Echo) EKG w/ Interpretation (93000)  Problem # 3:  HYPERTENSION (ICD-401.9) Controlled on current therapy.   His updated medication list for this problem includes:    Losartan Potassium 100 Mg Tabs (Losartan potassium) .Marland Kitchen... 1 tab once daily    Amlodipine Besylate 10 Mg Tabs (Amlodipine besylate) .Marland Kitchen... 1 tab once daily  Problem # 4:  SHORTNESS OF BREATH (ICD-786.05) Obtain echo as above.   His updated medication list for this problem includes:    Losartan Potassium 100 Mg Tabs (Losartan potassium) .Marland Kitchen... 1 tab once daily    Amlodipine Besylate 10 Mg Tabs (Amlodipine besylate) .Marland Kitchen... 1 tab once daily  Patient Instructions: 1)  Your physician recommends that you schedule a follow-up appointment in: 3-4 weeks 2)  Your physician has requested that you have an echocardiogram.  Echocardiography is a painless test that uses sound waves to create images of your heart. It  provides your doctor with information about the size and shape of your heart and how well your heart's chambers and valves are working.  This procedure takes approximately one hour. There are no restrictions for this procedure. 3)  Your physician has requested that you have an exercise stress myoview.  For further information please visit [email or web address]  Please follow instruction sheet, as given.

## 2011-01-09 NOTE — Assessment & Plan Note (Signed)
Summary: Cardiology Nuclear Study  Nuclear Med Background Indications for Stress Test: Evaluation for Ischemia   History: Echo, GXT, Heart Catheterization  History Comments: 06/09/04 GXT (-) Poor Exercise Tolerance 06/09/04 Heart Cath N/O CAD NL LVF 06/09/04 ECHO 45%  Symptoms: Chest Pressure, Chest Pressure with Exertion, DOE, Fatigue with Exertion    Nuclear Pre-Procedure Cardiac Risk Factors: Family History - CAD, Hypertension, Lipids, Smoker Caffeine/Decaff Intake: None NPO After: 7:30 AM IV 0.9% NS with Angio Cath: 22g     IV Site: (R) Hand IV Started by: Irean Hong RN Chest Size (in) 50     Height (in): 71 Weight (lb): 236 BMI: 33.03 Tech Comments: Stress portion cancelled per Dr. Ermalene Postin.  Dr. Riley Kill to evaluate patient today.  Patient came in today with c/o three days ago while riding in car with wife having trouble reading a complete street sign with left eye, also had (B) leg weakness and (L) arm weakness.  He also said he became disoriented.  [employer], CMA-N  Nuclear Med Study Reading MD:  Charlton Haws, MD     Referring MD:  C.McAlhany Resting Radionuclide:  Technetium 51m Tetrofosmin     Resting Radionuclide Dose:  11.0 mCi    Stress Protocol          Nuclear Technologist:  Burna Mortimer Deal RT-N  Rest Procedure  Myocardial perfusion imaging was performed at rest 45 minutes following the intravenous administration of Myoview Technetium 32m Tetrofosmin.  QPS Raw Data Images:  Soft tissue (diaphragm) underlies heart. Rest Images:  Normal homogeneous uptake in all areas of the myocardium. Transient Ischemic Dilatation:  .32  (Normal <1.22)    Overall Impression  Exercise Capacity: No stress performed. Overall Impression Comments: Normal perfusion at rest.  No stress performed.  No gating of images.

## 2011-01-09 NOTE — Progress Notes (Signed)
Summary: question about appt on 10/09/10/**LM/nm  Phone Note Call from Patient Call back at Home Phone [phone]   Caller: Patient Summary of Call: question about appt on 10/09/10 Initial call taken by: Judie Grieve,  October 06, 2010 2:33 PM  Follow-up for Phone Call        [facility]. Ollen Gross, RN, BSN  October 06, 2010 2:56 PM 10/06/10--1630pm--pt calling with ? about keeping appoint on monday 10/09/10--pt states he is scheduled for stent placement on fri--10/13/10 at 7:30 with dr Clifton James and does not want to have to pay co-payment if appoint not necessary--advised--pt does need to keep appoint on 10/09/10 at 2:15pm as PCI was scheduled at hospital D/C(09/21/10) and may need new labs drawn or instuctions on how to get to short stay center--pt agrees--nt Follow-up by: Ledon Snare, RN,  October 06, 2010 4:50 PM     Appended Document: question about appt on 10/09/10/**LM/nm I would like to see him. cdm

## 2011-01-09 NOTE — Assessment & Plan Note (Signed)
Summary: f1y per rem/lg   Visit Type:  1 yr f/u Primary Provider:  Noel Journey  CC:  pt states he has not been taking is medicines regulary like he should. pt also states that he has been @ [facility] w/son who is a pt there w/DVT's and states son went home and was recently shot w/9MM.....  History of Present Illness: 50 yo male with history of non-obstrucitve CAD by cath 2005, HTN, hyperlipidemia, impaired fasting glucose and obesity who is here today for follow up. I saw him in February 2011 for complaints of chest pain.   It was described as a heavy pressure in the center of his chest with exertion, associated with SOB. Localized mostly to the center of his chest without radiation. Lasted for several minutes. I arranged a stress test, an echo and carotid dopplers at the first visit. He missed his follow up appt. The carotid dopplers were normal. He came in for his stress test and complained of neurological changes. He was seen by Dr. Riley Kill and the stress test was cancelled. He was sent over to Dr. Stones office and had MRI/MRA of the head and neck. MRA showed no occlusive disease. MRI showed non-specific white matter changes but no clear infarct, possibly related to hypertension. His echo shows septal and inferobasal hypokinesis with mild concentric hypertrophy, EF 45-50%. There is a possible small PFO.   He finished PT and is still having weakness in his legs. He describes overall weakness since his stroke.  He has not been taking his medications because he has been over at the hospital with his son who was shot with a 9mm. His son now has a DVT and is back in the hospital. He has been having more frequent chest pains. Still described as a squeezing type pain, lasts for several minutes. He gets sweaty when he has this pain. No radiation of the pain. He has a different kind pain after meals which feels like heartburn. The chest pain has been severe and makes him double over in pain. It has become more  frequent and is occurring almost daily.    Current Medications (verified): 1)  Losartan Potassium 100 Mg Tabs (Losartan Potassium) .Marland Kitchen.. 1 Tab Once Daily 2)  Flomax 0.4 Mg Caps (Tamsulosin Hcl) .Marland Kitchen.. 1 Cap Once Daily 3)  Nitrostat 0.4 Mg Subl (Nitroglycerin) .Marland Kitchen.. 1 Tablet Under Tongue At Onset of Chest Pain; You May Repeat Every 5 Minutes For Up To 3 Doses. 4)  Amlodipine Besylate 5 Mg Tabs (Amlodipine Besylate) .... Take One Tablet By Mouth Daily 5)  Aspirin Ec 325 Mg Tbec (Aspirin) .... Take One Tablet By Mouth Daily 6)  Simvastatin 40 Mg Tabs (Simvastatin) .Marland Kitchen.. 1 Tab Once Daily  Allergies: 1)  ! Iodine  Past History:  Past Medical History: Reviewed history from 03/13/2010 and no changes required. CAD with cath 6/05 showing mild non-obstrucitve disease.  GERD (ICD-530.81) HYPERLIPIDEMIA (ICD-272.4) HYPERTENSION (ICD-401.9) Borderline DM Tobacco abuse CVA/TIA 3/11        Past Surgical History: Reviewed history from 01/26/2010 and no changes required. Knee Arthroscopy, 2 on left and 3 on right knee  Family History: Reviewed history from 01/26/2010 and no changes required. Father alive, CAD, CABG and CVA Mother: alive, healthy Sister: alive, CAD early 74s  Social History: Reviewed history from 01/26/2010 and no changes required. Tobacco Use - Yes. 1/2 ppd for 30 years. 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  Review of Systems       The patient complains of fatigue and chest pain.  The patient denies malaise, fever, weight gain/loss, vision loss, decreased hearing, hoarseness, palpitations, shortness of breath, prolonged cough, wheezing, sleep apnea, coughing up blood, abdominal pain, blood in stool, nausea, vomiting, diarrhea, heartburn, incontinence, blood in urine, muscle weakness, joint pain, leg swelling, rash, skin lesions, headache, fainting, dizziness, depression, anxiety,  enlarged lymph nodes, easy bruising or bleeding, and environmental allergies.    Vital Signs:  Patient profile:   50 year old male Height:      71 inches Weight:      236.12 pounds BMI:     33.05 Pulse rate:   96 / minute Pulse rhythm:   regular BP sitting:   142 / 100  (left arm) Cuff size:   large  Vitals Entered By: Danielle Rankin, CMA (September 13, 2010 11:39 AM)  Physical Exam  General:  General: Well developed, well nourished, NAD HEENT: OP clear, mucus membranes moist SKIN: warm, dry Neuro: No focal deficits Musculoskeletal: Muscle strength 5/5 all ext Psychiatric: Mood and affect normal Neck: No JVD, no carotid bruits, no thyromegaly, no lymphadenopathy. Lungs:Clear bilaterally, no wheezes, rhonci, crackles CV: RRR no murmurs, gallops rubs Abdomen: soft, NT, ND, BS present Extremities: No edema, pulses 2+.    EKG  Procedure date:  09/13/2010  Findings:      NSR, rate 96 bpm. Normal EKG  Impression & Recommendations:  Problem # 1:  CAD, NATIVE VESSEL (ICD-414.01)  He has known moderate CAD by cath 2005. His chest pain has worsened over the last few months. We discussed a stress test vs cath. He wishes to proceed with cardiac cath for definitive diagnosis. He has a dye allergy. Will pre medicate with Prednisone day before and of procedure. We will arrange for October 13th, 2011 at [facility] in the outpatient cath lab.  Risks and benefits reviewed.   His updated medication list for this problem includes:    Nitrostat 0.4 Mg Subl (Nitroglycerin) .Marland Kitchen... 1 tablet under tongue at onset of chest pain; you may repeat every 5 minutes for up to 3 doses.    Amlodipine Besylate 5 Mg Tabs (Amlodipine besylate) .Marland Kitchen... Take one tablet by mouth daily    Aspirin Ec 325 Mg Tbec (Aspirin) .Marland Kitchen... Take one tablet by mouth daily  His updated medication list for this problem includes:    Nitrostat 0.4 Mg Subl (Nitroglycerin) .Marland Kitchen... 1 tablet under tongue at onset of chest pain; you may  repeat every 5 minutes for up to 3 doses.    Amlodipine Besylate 5 Mg Tabs (Amlodipine besylate) .Marland Kitchen... Take one tablet by mouth daily    Aspirin Ec 325 Mg Tbec (Aspirin) .Marland Kitchen... Take one tablet by mouth daily  Problem # 2:  CHEST PAIN (ICD-786.50) Assessment: Deteriorated See above.   His updated medication list for this problem includes:    Nitrostat 0.4 Mg Subl (Nitroglycerin) .Marland Kitchen... 1 tablet under tongue at onset of chest pain; you may repeat every 5 minutes for up to 3 doses.    Amlodipine Besylate 5 Mg Tabs (Amlodipine besylate) .Marland Kitchen... Take one tablet by mouth daily    Aspirin Ec 325 Mg Tbec (Aspirin) .Marland Kitchen... Take one tablet by mouth daily  Orders: EKG w/ Interpretation (93000) Cardiac Catheterization (Cardiac Cath)  His updated medication list for this problem includes:    Nitrostat 0.4 Mg Subl (Nitroglycerin) .Marland Kitchen... 1 tablet under tongue  at onset of chest pain; you may repeat every 5 minutes for up to 3 doses.    Amlodipine Besylate 5 Mg Tabs (Amlodipine besylate) .Marland Kitchen... Take one tablet by mouth daily    Aspirin Ec 325 Mg Tbec (Aspirin) .Marland Kitchen... Take one tablet by mouth daily  Problem # 3:  TOBACCO ABUSE (ICD-305.1) Smoking cessation encouraged.   Problem # 4:  HYPERTENSION (ICD-401.9)  BP elevated but pt has not taken his meds. Encouraged to resume meds.   His updated medication list for this problem includes:    Losartan Potassium 100 Mg Tabs (Losartan potassium) .Marland Kitchen... 1 tab once daily    Amlodipine Besylate 5 Mg Tabs (Amlodipine besylate) .Marland Kitchen... Take one tablet by mouth daily    Aspirin Ec 325 Mg Tbec (Aspirin) .Marland Kitchen... Take one tablet by mouth daily  His updated medication list for this problem includes:    Losartan Potassium 100 Mg Tabs (Losartan potassium) .Marland Kitchen... 1 tab once daily    Amlodipine Besylate 5 Mg Tabs (Amlodipine besylate) .Marland Kitchen... Take one tablet by mouth daily    Aspirin Ec 325 Mg Tbec (Aspirin) .Marland Kitchen... Take one tablet by mouth daily  Other Orders: TLB-BMP (Basic  Metabolic Panel-BMET) (80048-METABOL) TLB-CBC Platelet - w/Differential (85025-CBCD) TLB-PT (Protime) (85610-PTP)  Patient Instructions: 1)  Your physician recommends that you schedule a follow-up appointment in: 3 weeks. 2)  Your physician recommends that you have lab work today. 3)  Your physician recommends that you continue on your current medications as directed. Please refer to the Current Medication list given to you today. 4)  Your physician has requested that you have a cardiac catheterization scheduled for 10/13 @ 7:30am.  Cardiac catheterization is used to diagnose and/or treat various heart conditions. Doctors may recommend this procedure for a number of different reasons. The most common reason is to evaluate chest pain. Chest pain can be a symptom of coronary artery disease (CAD), and cardiac catheterization can show whether plaque is narrowing or blocking your heart's arteries. This procedure is also used to evaluate the valves, as well as measure the blood flow and oxygen levels in different parts of your heart.  For further information please visit [email or web address]  Please follow instruction sheet, as given. 5)  Your physician has recommended you make the following change in your medication: PLEASE TAKE PREDNISONE BEFORE YOUR PROCEDURE. INSTRUCTIONS ARE ON YOUR CARDIAC CATH LETTER. Prescriptions: PREDNISONE 20 MG TABS (PREDNISONE) Take 3 pills at noon the day before & 3 pills the evening before your procedure by mouth.Ttake 3 pills the morning of your procedure.  #10 x 0   Entered by:   Whitney Maeola Sarah RN   Authorized by:   Verne Carrow, MD   Signed by:   Ellender Hose RN on 09/13/2010   Method used:   Electronically to        [facility] [address] [id number] (retail)       [address]       [place], [place]  [address]       Ph: [phone]       Fax: [phone]   RxID:   [id number] NITROSTAT 0.4 MG SUBL (NITROGLYCERIN) 1 tablet under  tongue at onset of chest pain; you may repeat every 5 minutes for up to 3 doses.  #25 x 6   Entered by:   Danielle Rankin, CMA   Authorized by:   Verne Carrow, MD   Signed by:   Okey Regal  Fiato, CMA on 09/13/2010   Method used:   Electronically to        [facility]  [address] [facility] [address] [facility] (retail)       [address]       [place], [place]  [address]       Ph: [phone]       Fax: [phone]   RxID:   [id number]

## 2011-01-09 NOTE — Letter (Signed)
Summary: Family Medicine at [facility] Note  Family Medicine at [facility] Note   Imported By: Roderic Ovens 04/03/2010 14:02:01  _____________________________________________________________________  External Attachment:    Type:   Image     Comment:   External Document

## 2011-01-09 NOTE — Progress Notes (Signed)
Summary: Nuclear Pre-Procedure  Phone Note Outgoing Call   Call placed by: Milana Na, EMT-P,  February 07, 2010 1:55 PM Summary of Call: Reviewed information on [employer] Information Sheet (see scanned document for further details).  Spoke with patient.     Nuclear Med Background Indications for Stress Test: Evaluation for Ischemia   History: Echo, GXT, Heart Catheterization  History Comments: 06/09/04 GXT (-) Poor Exercise Tolerance 06/09/04 Heart Cath N/O CAD NL LVF 06/09/04 ECHO 45%  Symptoms: Chest Pressure with Exertion, DOE, Fatigue with Exertion    Nuclear Pre-Procedure Cardiac Risk Factors: Family History - CAD, Hypertension, Lipids, Smoker Height (in): 71  Nuclear Med Study Referring MD:  C.McAlhany

## 2011-01-09 NOTE — Letter (Signed)
Summary: Generic Letter  Architectural technologist, Main Office  [address]   [place], [place] [address]   Phone: [phone]  Fax: [phone]        August 25, 2010 MRN: [id number]    Tony Davis [address] [place], [place]  [address]    Dear [name],  We have been trying to reach you to schedule your follow up appointment with Dr. Clifton James and to make a change to your medications. If you could please call us at [phone], we would greatly appreciate it.  Sincerely,  Ellender Hose RN  This letter has been electronically signed by your physician.

## 2011-01-09 NOTE — Assessment & Plan Note (Signed)
Summary: rov/ post cath/wa   Visit Type:  rov Primary Provider:  Noel Journey  CC:  fatigued today....  History of Present Illness: 50 yo male with history of non-obstrucitve CAD by cath 2005, HTN, hyperlipidemia, impaired fasting glucose and obesity who is here today for follow up. I saw him in February 2011 for complaints of chest pain.   It was described as a heavy pressure in the center of his chest with exertion, associated with SOB. Localized mostly to the center of his chest without radiation. Lasted for several minutes. I arranged a stress test, an echo and carotid dopplers at the first visit. He missed his follow up appt. The carotid dopplers were normal. He came in for his stress test and complained of neurological changes. He was seen by Dr. Riley Kill and the stress test was cancelled. He was sent over to Dr. Stones office and had MRI/MRA of the head and neck. MRA showed no occlusive disease. MRI showed non-specific white matter changes but no clear infarct, possibly related to hypertension. His echo shows septal and inferobasal hypokinesis with mild concentric hypertrophy, EF 45-50%. There is a possible small PFO.   I saw him several weeks ago for assessment. He finished PT and is still having weakness in his legs. He described overall weakness since his stroke as well as chest pain. He has been having more frequent chest pains. Still described as a squeezing type pain, lasts for several minutes. He gets sweaty when he has this pain. No radiation of the pain. I arranged a left heart cath in the outpatient lab. He was found to have a stenosis in the mid Circumflex. He told us about dark stools the day before the cath. The intervention was delayed pending a GI workup. He has had no further issues with dark stools. He has been feeling well but still having overall decreased energy and chest pains. Tentative plans for PCI of Circumflex on 10/13/10 at [facility].   Current Medications  (verified): 1)  Losartan Potassium 100 Mg Tabs (Losartan Potassium) .Marland Kitchen.. 1 Tab Once Daily 2)  Flomax 0.4 Mg Caps (Tamsulosin Hcl) .Marland Kitchen.. 1 Cap Once Daily 3)  Nitrostat 0.4 Mg Subl (Nitroglycerin) .Marland Kitchen.. 1 Tablet Under Tongue At Onset of Chest Pain; You May Repeat Every 5 Minutes For Up To 3 Doses. 4)  Amlodipine Besylate 5 Mg Tabs (Amlodipine Besylate) .... Take One Tablet By Mouth Daily 5)  Aspirin Ec 325 Mg Tbec (Aspirin) .... Take One Tablet By Mouth Daily 6)  Simvastatin 40 Mg Tabs (Simvastatin) .Marland Kitchen.. 1 Tab Once Daily  Allergies: 1)  ! Iodine  Past History:  Past Medical History: CAD with cath 6/05 showing mild non-obstrucitve disease, cath 10/11 with mild LAD, 80% mid Circ, PLA 70%.  GERD (ICD-530.81) HYPERLIPIDEMIA (ICD-272.4) HYPERTENSION (ICD-401.9) Borderline DM Tobacco abuse CVA/TIA 3/11        Past Surgical History: Reviewed history from 01/26/2010 and no changes required. Knee Arthroscopy, 2 on left and 3 on right knee  Family History: Reviewed history from 01/26/2010 and no changes required. Father alive, CAD, CABG and CVA Mother: alive, healthy Sister: alive, CAD early 61s  Social History: Reviewed history from 09/13/2010 and no changes required. Tobacco Use - Yes. 1/2 ppd for 30 years. 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  Review of Systems       The patient complains of  fatigue and chest pain.  The patient denies malaise, fever, weight gain/loss, vision loss, decreased hearing, hoarseness, palpitations, shortness of breath, prolonged cough, wheezing, sleep apnea, coughing up blood, abdominal pain, blood in stool, nausea, vomiting, diarrhea, heartburn, incontinence, blood in urine, muscle weakness, joint pain, leg swelling, rash, skin lesions, headache, fainting, dizziness, depression, anxiety, enlarged lymph nodes, easy bruising or bleeding, and environmental allergies.     Vital Signs:  Patient profile:   50 year old male Height:      71 inches Weight:      238.8 pounds BMI:     33.43 Pulse rate:   100 / minute Pulse rhythm:   regular BP sitting:   136 / 98  (left arm) Cuff size:   large  Vitals Entered By: Danielle Rankin, CMA (October 09, 2010 2:16 PM)  Physical Exam  General:  General: Well developed, well nourished, NAD HEENT: OP clear, mucus membranes moist SKIN: warm, dry Neuro: No focal deficits Musculoskeletal: Muscle strength 5/5 all ext Psychiatric: Mood and affect normal Neck: No JVD, no carotid bruits, no thyromegaly, no lymphadenopathy. Lungs:Clear bilaterally, no wheezes, rhonci, crackles CV: RRR no murmurs, gallops rubs Abdomen: soft, NT, ND, BS present Extremities: No edema, pulses 2+.    Cardiac Cath  Procedure date:  09/21/2010  Findings:      1. Left main coronary artery had no evidence of disease. 2. The left anterior descending was large vessel that coursed around     the anterior wall and did not wrap around the apex.  There appeared     to be mild plaque disease in this vessel.  There was a moderate-     sized diagonal branch with mild plaque disease. 3. Circumflex artery gave off an early marginal branch that     represented an intermediate branch.  There was no disease in this     vessel.  The next marginal branch was relatively small in caliber     and had no disease.  The second obtuse marginal branch was moderate-     sized and had 30% plaque.  After the takeoff of the second marginal     branch, there was 80% hazy stenosis in the mid circumflex vessel.     Beyond this, there was third obtuse marginal branch with plaque     disease. 4. The right coronary artery is a large dominant vessel that wraps     around the apex of the left ventricle.  There is mild 25% stenosis     in the proximal midportion of the vessel.  The posterior descending     artery is a large-caliber vessel with mild plaque disease.  The      posterolateral branch has a proximal 70% stenosis and the mid 40%     stenosis.  This is unchanged from the catheterization in 2005. 5. Left ventricular angiogram was performed in the RAO projection,     which showed normal left ventricular systolic function with     ejection fraction of 60%.  Impression & Recommendations:  Problem # 1:  CAD, NATIVE VESSEL (ICD-414.01)  Will plan PCI of mid Circ on 10/13/10 at [facility]. Plan to use DES. Will start Plavix today. Continue ASA. Will start beta blocker in hospital. Continue statin. He is having no muscle aches on Zocor 40 mg. Consider changing to Crestor. Check labs today including BMET, CBC and coags. We will need to pretreat for dye allergy with prednisone.   His updated medication  list for this problem includes:    Nitrostat 0.4 Mg Subl (Nitroglycerin) .Marland Kitchen... 1 tablet under tongue at onset of chest pain; you may repeat every 5 minutes for up to 3 doses.    Amlodipine Besylate 5 Mg Tabs (Amlodipine besylate) .Marland Kitchen... Take one tablet by mouth daily    Aspirin Ec 325 Mg Tbec (Aspirin) .Marland Kitchen... Take one tablet by mouth daily    Plavix 75 Mg Tabs (Clopidogrel bisulfate) .Marland Kitchen... Take one tablet by mouth daily  Orders: Cardiac Catheterization (Cardiac Cath)  Other Orders: TLB-BMP (Basic Metabolic Panel-BMET) (80048-METABOL) TLB-CBC Platelet - w/Differential (85025-CBCD) TLB-PT (Protime) (85610-PTP)  Patient Instructions: 1)  Your physician recommends that you schedule a follow-up appointment in: 3-4 weeks. 2)  Your physician has recommended you make the following change in your medication: START taking 60mg  of Prednisone at noon the day before, 60mg  the evening before, and then 60mg  the morning of your procedure. Please START 75mg  of Plavix. 3)  Your physician has requested that you have a cardiac catheterization on 11/4 @ 7:30am. Be there at 5:30am. Cardiac catheterization is used to diagnose and/or treat various heart conditions. Doctors may  recommend this procedure for a number of different reasons. The most common reason is to evaluate chest pain. Chest pain can be a symptom of coronary artery disease (CAD), and cardiac catheterization can show whether plaque is narrowing or blocking your heart's arteries. This procedure is also used to evaluate the valves, as well as measure the blood flow and oxygen levels in different parts of your heart.  For further information please visit [email or web address]  Please follow instruction sheet, as given. Prescriptions: PLAVIX 75 MG TABS (CLOPIDOGREL BISULFATE) Take one tablet by mouth daily  #30 x 8   Entered by:   Whitney Maeola Sarah RN   Authorized by:   Verne Carrow, MD   Signed by:   Ellender Hose RN on 10/09/2010   Method used:   [facility] to        [facility] [address] [id number]       [address]       [place], [place]  [address]       Ph: [phone]       Fax: [phone]   RxID:   [id number] PREDNISONE 20 MG TABS (PREDNISONE) take three tabs at noon the day before, three tabs the evening before, and three tabs the morning  of the procedure.  #10 x 0   Entered by:   Whitney Maeola Sarah RN   Authorized by:   Verne Carrow, MD   Signed by:   Ellender Hose RN on 10/09/2010   Method used:   [facility] to        [facility] [address] [id number]       [address]       [place], [place]  [address]       Ph: [phone]       Fax: [phone]   RxID:   [id number]

## 2011-02-08 ENCOUNTER — Telehealth: Payer: Self-pay | Admitting: Cardiovascular Disease

## 2011-02-15 NOTE — Progress Notes (Signed)
Summary: pt needs refill asap he is out  Phone Note Refill Request Message from:  Patient  Refills Requested: Medication #1:  LOSARTAN POTASSIUM 100 MG TABS 1 tab once daily  Medication #2:  FLOMAX 0.4 MG CAPS 1 cap once daily [facility] [address]   Initial call taken by: Omer Jack,  February 08, 2011 1:17 PM  Follow-up for Phone Call        tried calling pt, LMOM, I filled Losartan and was gonna let him know that he needs to get Flomax filled by Dr Larina Bras, his PCP. Follow-up by: Hardin Negus, RMA,  February 08, 2011 4:18 PM    Prescriptions: LOSARTAN POTASSIUM 100 MG TABS (LOSARTAN POTASSIUM) 1 tab once daily  #30 x 10   Entered by:   Caralee Ates CMA   Authorized by:   Verne Carrow, MD   Signed by:   Caralee Ates CMA on 02/09/2011   Method used:   [facility]        [facility]  [address] [id number]       [address]       [place], [place]  [address]       Ph: [phone]       Fax: [phone]   RxID:   [id number] FLOMAX 0.4 MG CAPS (TAMSULOSIN HCL) 1 cap once daily  #30 x 0   Entered by:   Caralee Ates CMA   Authorized by:   Verne Carrow, MD   Signed by:   Caralee Ates CMA on 02/09/2011   Method used:   [facility]        [facility]  [address] [id number]       [address]       [place], [place]  [address]       Ph: [phone]       Fax: [phone]   RxID:   [id number] LOSARTAN POTASSIUM 100 MG TABS (LOSARTAN POTASSIUM) 1 tab once daily  #30 x 10   Entered by:   Hardin Negus, RMA   Authorized by:   Verne Carrow, MD   Signed by:   Hardin Negus, RMA on 02/08/2011   Method used:   [facility]ronically to        [facility]  [address] [address] [id number]       [address]       [place], [place]  [address]       Ph: [phone]       Fax: [phone]   RxID:   [id number]

## 2011-02-20 LAB — BASIC METABOLIC PANEL
CO2: 27 mEq/L (ref 19–32)
Chloride: 108 mEq/L (ref 96–112)
Creatinine, Ser: 1.01 mg/dL (ref 0.4–1.5)
GFR calc Af Amer: >60 mL/min (ref >60)
Potassium: 2.9 mEq/L — ABNORMAL LOW (ref 3.5–5.1)
Sodium: 143 mEq/L (ref 135–145)

## 2011-02-20 LAB — CBC
HCT: 39.8 % (ref 39.0–52.0)
Hemoglobin: 13.2 g/dL (ref 13.0–17.0)
MCH: 29.5 pg (ref 26.0–34.0)
MCV: 89 fL (ref 78.0–100.0)
RBC: 4.47 MIL/uL (ref 4.22–5.81)

## 2011-04-13 ENCOUNTER — Other Ambulatory Visit: Payer: Self-pay | Admitting: Cardiology

## 2011-04-27 NOTE — Discharge Summary (Signed)
NAME:  Tony, Davis                         ACCOUNT NO.:  [id number]   MEDICAL RECORD NO.:  [id number]                   PATIENT TYPE:  INP   LOCATION:  3710                                 FACILITY:  [facility]   PHYSICIAN:  Cecil Cranker, M.D.             DATE OF BIRTH:  02-19-1961   DATE OF ADMISSION:  06/13/2004  DATE OF DISCHARGE:  06/15/2004                                 DISCHARGE SUMMARY   PROCEDURE:  1. Cardiac catheterization.  2. Coronary arteriogram.  3. Left ventriculogram.  4. PA and lateral chest x-ray.  5. VQ scan.   HOSPITAL COURSE:  [name] is a 50 year old male with no known history of  coronary artery disease.  He had some exertional dyspnea and chest pain and  was seen in the office on June 09, 2004.  A 2D echocardiogram was performed  which showed some left ventricular dysfunction and an EF of 40%.  It was  felt that cardiac catheterization was indicated but he needed dye  prophylaxis, so he was admitted on June 13, 2004, for premedication and  catheterization on June 14, 2004.   The cardiac catheterization showed noncritical coronary artery disease  between 20% and 70% with the only 70% stenosis being a small branch of the  RCA.  He had 20-40% lesions in the RCA, OM, and diagonal.  There was a 50%  stenosis in the very distal PLA.  His EF was normal and estimated at greater  than 55%.  His abdominal aortogram was normal.  It was felt that his  shortness of breath was not cardiac in origin and it was felt that he needed  further respiratory evaluation.   As part of his respiratory evaluation, he had a D-dimer and ABG performed.  ABG on room air showed pH 7.413 with a pCO2 37, pO2 109, and bicarb 24.1.  The two view chest x-ray was also performed that showed low lung volumes but  no active disease and no evidence of mass or adenopathy.  The D-dimer was  also performed and was within normal limits at 0.32.  However, after his  cath, it was reported din the  chart that he had O2 saturation of 75% on room  air which was increased to 98% once he was put on 2 liters of O2.   Mr. Mullenbach was evaluated by Dr. Eden Emms on June 15, 2004, and it was felt a VQ  scan was indicated to rule out PE.  This was performed and was normal.  Mr.  Yarbro has stated that he had some shortness of breath with ambulation on  June 15, 2004, but his oxygen saturation decreased to 94% with ambulation and  was 100% on room air after activity.  Dr. Eden Emms was consulted and felt that  no further inpatient evaluation was needed but he could be evaluated as an  outpatient by pulmonary.  An appointment was, therefore, made  with Dr. Sherene Sires  on June 23, 2004.  Mr. Malinowski was considered stable for discharge on June 15, 2004, with outpatient follow up arranged.   CONDITION ON DISCHARGE:  Stable.   DISCHARGE DIAGNOSIS:  1. Shortness of breath and chest pain with exertion, no critical coronary     artery disease by cath and normal VQ scan.  2. Reported left ventricular dysfunction with an ejection fraction of 40% by     echocardiogram , ejection fraction greater than 5% at cath.  3. Hypertension.  4. Hyperlipidemia.  5. History of tobacco use.  6. Arthralgias.  7. Allergy to contrast dye.  8. Strong family history of premature coronary artery disease.  9. Poor exercise tolerance on office treadmill on June 09, 2004.  10.      Gastroesophageal reflux disease.   DISCHARGE INSTRUCTIONS:  His activity level is to include no strenuous  activity and no lifting over 10 pounds per week.  He is to call the office  for problems with the cath site.  He is not to use tobacco.  He is to stick  to a low fat diet.  He is to follow up with Dr. Gabriel Rung on August 3.  He is to  follow up with Dr. Sherene Sires on July 15.   DISCHARGE MEDICATIONS:  1. Zoloft 50 mg daily,  2. Cevitan 600/12.5 mg b.i.d.  3. Metoprolol as at home.  4. Lipitor 20 mg p.o. daily.  5. Aspirin 81 mg p.o. daily.      Theodore Demark, P.A. [facility]                  E. Graceann Congress, M.D.    RB/MEDQ  D:  06/15/2004  T:  06/16/2004  Job:  [id number]   cc:   Saul Fordyce, M.D.   Cecil Cranker, M.D.   Charlaine Dalton. Sherene Sires, M.D. [facility]

## 2011-04-27 NOTE — Cardiovascular Report (Signed)
NAME:  Tony Davis, [facility] NO.:  [id number]   MEDICAL RECORD NO.:  [id number]                   PATIENT TYPE:  INP   LOCATION:  3710                                 FACILITY:  [facility]   PHYSICIAN:  Carole Binning, M.D. [facility]         DATE OF BIRTH:  December 17, 1960   DATE OF PROCEDURE:  06/14/2004  DATE OF DISCHARGE:                              CARDIAC CATHETERIZATION   PROCEDURES PERFORMED:  1. Left heart catheterization with,  2. Coronary angiography,  3. Left ventriculography; and,  4. Abdominal aortography.   CARDIOLOGIST:  Carole Binning, M.D.   INDICATIONS:  Mr. Mairena is a 50 year old male with symptoms of progressive  exertional dyspnea and chest pain.  He was seen in the office and underwent  an exercise treadmill test.  He was only able to exercise for three minutes.  There were no diagnostic EKG changes.  An echocardiogram suggested left  ventricular dysfunction with an ejection fraction of 40%.  He was therefore  referred for cardiac catheterization.  He does have a history of severe DYE  allergy and he was treated with steroids for prophylaxis.   PROCEDURAL NOTE:  A 6 French sheath was placed in the right femoral artery.  Coronary angiography was performed with standard Judkins 6 French catheters.  Left ventriculography and abdominal aortography were performed with an  angled pigtail catheter.   CONTRAST MATERIAL:  Contrast was Omnipaque.   COMPLICATIONS:  There were no complications.   RESULTS:   HEMODYNAMIC DATA:  Left ventricular pressure 136/18.  Aortic pressure  136/94.  There is no aortic valve gradient.   VENTRICULOGRAPHIC DATA:  Left Ventriculogram:  Wall motion appears normal.  Ejection fraction is estimated at greater than or equal to 55%.  It could  not be precisely quantitated due to ventricular ectopy.  There was no  significant mitral regurgitation.   Abdominal Aortogram:  The abdominal aortogram reveals normal  abdominal  aorta, renal arteries and iliac arteries.   ARTERIOGRAPHIC DATA:  Coronary Arteriography (Right Dominant)  Left Main:  The left main is normal.   Left Anterior Descending Artery:  The left anterior descending artery has  mild ectatic changes.  It gives rise to normal size diagonal branch.  The  diagonal branch has a 30% stenosis proximally and a 40% stenosis in the mid  branch.   Left Circumflex:  The left circumflex had mild diffuse irregularities.  The  circumflex gives rise to a normal size ramus intermedius and three normal  size obtuse marginal branches.  There were diffuse luminal irregularities in  the distal circumflex and in the second obtuse marginal branch.   Right Coronary Artery:  The right coronary artery has mild diffuse ectasia.  There is a 20% stenosis in the proximal vessel and diffuse 25% in the  midvessel.  The distal right coronary artery gives rise to a large posterior  descending artery.  There is a tubular 50% stenosis  in the distal portion of  the posterior descending artery.  In the AV groove portion of the distal  right coronary artery there is a 70% followed by a 30% stenosis.  Beyond  this the vessel gives rise to a very small posterolateral branch.   IMPRESSION:  1. Normal left ventricular systolic function.  2. Mild-to-moderate coronary artery disease, which does not appear to     obstructive or hemodynamically significant in nature.   RECOMMENDATIONS:  Medical therapy with aggressive control of the patient's  blood pressure and lipids.                                               Carole Binning, M.D. [facility]    MWP/MEDQ  D:  06/14/2004  T:  06/15/2004  Job:  [id number]   cc:   Saul Fordyce, M.D.   Cecil Cranker, M.D.   [facility]atheterization [facility]

## 2011-04-27 NOTE — Op Note (Signed)
NAME:  Tony Davis, Tony Davis                         ACCOUNT NO.:  [id number]   MEDICAL RECORD NO.:  [id number]                   PATIENT TYPE:  AMB   LOCATION:  DSC                                  FACILITY:  [facility]   PHYSICIAN:  Lubertha Basque. Jerl Santos, M.D.             DATE OF BIRTH:  11-13-1961   DATE OF PROCEDURE:  01/25/2004  DATE OF DISCHARGE:                                 OPERATIVE REPORT   PREOPERATIVE DIAGNOSIS:  Right knee torn medial meniscus.   POSTOPERATIVE DIAGNOSIS:  Right knee torn medial meniscus.   PROCEDURE:  Right knee partial medial meniscectomy.   ANESTHESIA:  General.   SURGEON:  Lubertha Basque. Jerl Santos, M.D.   ASSISTANT:  Lindwood Qua, P.A.   INDICATIONS FOR PROCEDURE:  The patient is a 50 year old man who is about  one year out from a knee injury and arthroscopy.  He underwent a partial  medial meniscectomy at that point, but unfortunately has persisted with pain  and disability.  He has undergone a repeat MRI scan, which shows a far  posterior horn medial meniscus tear, which appears different than his  initial tear.  He is offered a repeat arthroscopy.  An informed operative  consent was obtained, after a discussion of the possible complications of,  reaction to anesthesia and infection.   DESCRIPTION OF PROCEDURE:  The patient is taken to the operating suite where  general anesthetic was applied without difficulty.  He was positioned supine  and prepped and draped in a normal sterile fashion.  After the  administration of preoperative IV antibiotics, an arthroscopy of the right  knee was performed through two old inferior portals.  The suprapatellar  pouch was benign, while the patellofemoral joint exhibited some grade 3  change across the broad area of the intratrochlear groove and a small area  of the superomedial aspect of the patella.  A brief chondroplasty was done,  but no major flaps of cartilage were encountered.  In the medial  compartment, he did  have a degenerative tear of the posterior horn of the  medial meniscus near the attachment point.  This was addressed with a 5%  partial medial  meniscectomy.  The rest of the structure appeared benign.  He had one dime-sized area of grade 3 change on the medial femoral condyle,  which appeared to have smoothed off and keeled over.  The ACL appeared  intact, and the lateral compartment was completely benign.  The knee was  irrigated at the end of the case, followed by the placement of Marcaine with  epinephrine and morphine.  Adaptic was placed over the portals, followed by  a dry gauze and a loose Ace wrap.  The estimated blood loss and the  intraoperative fluids can be obtained from the anesthesia records.   DISPOSITION:  The patient was extubated in the operating room and taken to  the recovery room in stable condition.  The  plan is for him to go home the  same day, and to follow up in the office in less than one week.  I will  contact him by phone tonight.                                               Lubertha Basque Jerl Santos, M.D.   PGD/MEDQ  D:  01/25/2004  T:  01/25/2004  Job:  [id number]

## 2011-06-06 LAB — HM DIABETES EYE EXAM: HM Diabetic Eye Exam: NORMAL

## 2011-06-12 ENCOUNTER — Telehealth: Payer: Self-pay | Admitting: Cardiovascular Disease

## 2011-06-12 MED ORDER — AMLODIPINE BESYLATE 5 MG PO TABS: 5.0000 mg | ORAL_TABLET | Freq: Every day | ORAL | Status: DC

## 2011-06-12 MED ORDER — CLOPIDOGREL BISULFATE 75 MG PO TABS: 75.0000 mg | ORAL_TABLET | Freq: Every day | ORAL | Status: DC

## 2011-06-12 MED ORDER — SIMVASTATIN 40 MG PO TABS: ORAL_TABLET | ORAL | Status: DC

## 2011-06-12 MED ORDER — LOSARTAN POTASSIUM 100 MG PO TABS: 100.0000 mg | ORAL_TABLET | Freq: Every day | ORAL | Status: DC

## 2011-06-12 NOTE — Telephone Encounter (Signed)
Pt has been told to get flomax from Primary MD

## 2011-06-12 NOTE — Telephone Encounter (Signed)
Per pt call pt needs refills: Lorsartan 100mg , Plavix 75 mg, Tamsulosinac (pt spelled this medication out?) 04. Mg, amilodipine 5mg , simvastatin 40 mg.   [facility] [address] [phone]

## 2011-07-06 ENCOUNTER — Encounter: Payer: Self-pay | Admitting: Cardiovascular Disease

## 2011-07-10 ENCOUNTER — Encounter: Payer: Self-pay | Admitting: Cardiovascular Disease

## 2011-07-12 ENCOUNTER — Ambulatory Visit: Payer: Self-pay | Admitting: Cardiovascular Disease

## 2011-07-17 ENCOUNTER — Encounter: Payer: Self-pay | Admitting: Cardiovascular Disease

## 2011-07-17 ENCOUNTER — Ambulatory Visit (INDEPENDENT_AMBULATORY_CARE_PROVIDER_SITE_OTHER): Payer: BC Managed Care – PPO | Admitting: Cardiovascular Disease

## 2011-07-17 VITALS — BP 138/89 | HR 90 | Ht 71.0 in | Wt 236.0 lb

## 2011-07-17 DIAGNOSIS — H538 Other visual disturbances: Secondary | ICD-10-CM

## 2011-07-17 DIAGNOSIS — I2510 Atherosclerotic heart disease of native coronary artery without angina pectoris: Secondary | ICD-10-CM

## 2011-07-17 DIAGNOSIS — Q211 Atrial septal defect: Secondary | ICD-10-CM

## 2011-07-17 NOTE — Assessment & Plan Note (Signed)
Stable. No changes. I do not think his dizziness is related to his heart. Will arrange echo to assess LV function with bubble study to assess for PFO. We will also arrange appt in Neurology and in primary care.

## 2011-07-17 NOTE — Progress Notes (Signed)
History of Present Illness:49 yo AAM with history of CAD, HTN, hyperlipidemia, impaired fasting glucose and obesity who is here today for follow up. I saw him in February 2011 for complaints of chest pain.   I arranged a stress test, an echo and carotid dopplers at the first visit. He missed his follow up appt. The carotid dopplers were normal. He came in for his stress test and complained of neurological changes. He was seen by Dr. Riley Kill and the stress test was cancelled. He was sent over to Dr. Stones office and had MRI/MRA of the head and neck. MRA showed no occlusive disease. MRI showed non-specific white matter changes but no clear infarct, possibly related to hypertension. His echo shows septal and inferobasal hypokinesis with mild concentric hypertrophy, EF 45-50%. There was a possible small PFO but this did not seem to be significant.    He was seen here in October 2011 and was still having chest pains. I arranged a left heart cath in the outpatient lab. He was found to have a stenosis in the mid Circumflex. He told us about dark stools the day before the cath. The intervention was delayed pending a GI workup. He has had no further issues with dark stools at follow up 2 weeks ago. He was brought back in 10/13/10 for planned PCI of the Circumflex. A Promus drug eluting stent was placed without any complications.   He is here today for follow up. He tells me that he has similar symptoms in his left eye like he did back when he had the possible stroke and neurological issues. His left eye becomes blurry every day. This lasts for an hour or several hours. No headaches. No nausea. He has had some dizziness. He has not followed up with a Neurologist in the last six months. He is not sure if he ever saw Neurology. He has had one episode of chest tightness 2 weeks ago. This only lasted for a few minutes. He says his BP was high when checked two weeks ago in his dentist office. He has been taking all meds. He  had been followed by Dr. Noel Journey but she closed her office and he now has no primary care doctor.   Past Medical History  Diagnosis Date  . CAD (coronary artery disease)     cath 10/11 with mild LAD, 80% mid circ, PLA 70%. no s/p promus DES circumflex artery 10/13/10  . GERD (gastroesophageal reflux disease)   . HLD (hyperlipidemia)   . HTN (hypertension)   . DM (diabetes mellitus)     boarderline  . Tobacco abuse   . CVA (cerebral vascular accident) 3/11  . TIA (transient ischemic attack) 3/11    Past Surgical History  Procedure Date  . Knee arthroscopy     2 on left and 3 on rt    Current Outpatient Prescriptions  Medication Sig Dispense Refill  . amLODipine (NORVASC) 5 MG tablet Take 1 tablet (5 mg total) by mouth daily.  30 tablet  5  . aspirin 325 MG EC tablet Take 325 mg by mouth daily.        . clopidogrel (PLAVIX) 75 MG tablet Take 1 tablet (75 mg total) by mouth daily.  30 tablet  12  . losartan (COZAAR) 100 MG tablet Take 100 mg by mouth daily.        . nitroGLYCERIN (NITROSTAT) 0.4 MG SL tablet Place 0.4 mg under the tongue every 5 (five) minutes as needed.        Marland Kitchen  potassium chloride SA (K-DUR,KLOR-CON) 20 MEQ tablet Take 20 mEq by mouth daily.       . simvastatin (ZOCOR) 40 MG tablet 1/2 tab  Po qd  30 tablet  11    Allergies  Allergen Reactions  . Iodine     REACTION: anaphylaxis    History   Social History  . Marital Status: Married    Spouse Name: N/A    Number of Children: N/A  . Years of Education: N/A   Occupational History  . Not on file.   Social History Main Topics  . Smoking status: Current Everyday Smoker  . Smokeless tobacco: Not on file   Comment: 1/2 ppd x 30 years   . Alcohol Use: No  . Drug Use: No  . Sexually Active: Not on file   Other Topics Concern  . Not on file   Social History Narrative   Married, 5 kids; lives in [place]. 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     No family history on  file.  Review of Systems:  As stated in the HPI and otherwise negative.   BP 138/89  Pulse 90  Ht 5\' 11"  (1.803 m)  Wt 236 lb (107.049 kg)  BMI 32.92 kg/m2  Physical Examination: General: Well developed, well nourished, NAD HEENT: OP clear, mucus membranes moist SKIN: warm, dry. No rashes. Neuro: No focal deficits Musculoskeletal: Muscle strength 5/5 all ext Psychiatric: Mood and affect normal Neck: No JVD, no carotid bruits, no thyromegaly, no lymphadenopathy. Lungs:Clear bilaterally, no wheezes, rhonci, crackles Cardiovascular: Regular rate and rhythm. No murmurs, gallops or rubs. Abdomen:Soft. Bowel sounds present. Non-tender.  Extremities: No lower extremity edema. Pulses are 2 + in the bilateral DP/PT.  EKG:NSR, rate 90 bpm.

## 2011-07-17 NOTE — Patient Instructions (Signed)
Your physician recommends that you schedule a follow-up appointment in: 6 months  Your doctor has recommended you have an echocardiogram with a bubble study to check for a patent foramen ovale.  You have referred to Neurology and to Primary Care to see Dr. Yetta Barre.

## 2011-07-18 ENCOUNTER — Encounter: Payer: Self-pay | Admitting: *Deleted

## 2011-07-19 ENCOUNTER — Other Ambulatory Visit (HOSPITAL_COMMUNITY): Payer: Self-pay

## 2011-07-19 ENCOUNTER — Ambulatory Visit (HOSPITAL_COMMUNITY): Payer: BC Managed Care – PPO | Attending: Cardiovascular Disease

## 2011-07-19 ENCOUNTER — Encounter (HOSPITAL_COMMUNITY): Payer: Self-pay

## 2011-07-19 DIAGNOSIS — E119 Type 2 diabetes mellitus without complications: Secondary | ICD-10-CM | POA: Insufficient documentation

## 2011-07-19 DIAGNOSIS — I2510 Atherosclerotic heart disease of native coronary artery without angina pectoris: Secondary | ICD-10-CM | POA: Insufficient documentation

## 2011-07-19 DIAGNOSIS — R079 Chest pain, unspecified: Secondary | ICD-10-CM | POA: Insufficient documentation

## 2011-07-19 DIAGNOSIS — I079 Rheumatic tricuspid valve disease, unspecified: Secondary | ICD-10-CM | POA: Insufficient documentation

## 2011-07-19 DIAGNOSIS — F17200 Nicotine dependence, unspecified, uncomplicated: Secondary | ICD-10-CM | POA: Insufficient documentation

## 2011-07-19 DIAGNOSIS — Q2111 Secundum atrial septal defect: Secondary | ICD-10-CM | POA: Insufficient documentation

## 2011-07-19 DIAGNOSIS — E669 Obesity, unspecified: Secondary | ICD-10-CM | POA: Insufficient documentation

## 2011-07-19 DIAGNOSIS — Z8673 Personal history of transient ischemic attack (TIA), and cerebral infarction without residual deficits: Secondary | ICD-10-CM | POA: Insufficient documentation

## 2011-07-19 DIAGNOSIS — Q211 Atrial septal defect: Secondary | ICD-10-CM

## 2011-07-19 DIAGNOSIS — G459 Transient cerebral ischemic attack, unspecified: Secondary | ICD-10-CM

## 2011-07-19 DIAGNOSIS — I10 Essential (primary) hypertension: Secondary | ICD-10-CM | POA: Insufficient documentation

## 2011-07-19 DIAGNOSIS — E785 Hyperlipidemia, unspecified: Secondary | ICD-10-CM | POA: Insufficient documentation

## 2011-07-19 NOTE — Progress Notes (Signed)
  IV 20G angiocath (R) hand for Echo Bubble Study.Macsen Nuttall,RN.

## 2011-07-25 ENCOUNTER — Telehealth: Payer: Self-pay | Admitting: Cardiovascular Disease

## 2011-07-25 NOTE — Telephone Encounter (Signed)
Whitney, I called and spoke to him about echo results. He has a PFO (atrial septal defect). I have spoken to pt and Calton Dach and would like to refer to [facility] for possible closure of PFO. Pt is agreeable. Can we arrange this and let pt know? Thanks, chris (pt wants to be called on cell phone)

## 2011-08-03 ENCOUNTER — Emergency Department (HOSPITAL_COMMUNITY): Payer: BC Managed Care – PPO

## 2011-08-03 ENCOUNTER — Emergency Department (HOSPITAL_COMMUNITY)
Admission: EM | Admit: 2011-08-03 | Discharge: 2011-08-04 | Disposition: A | Discharge status: Home / Self Care | Payer: BC Managed Care – PPO | Attending: Emergency Medicine | Admitting: Emergency Medicine

## 2011-08-03 DIAGNOSIS — R109 Unspecified abdominal pain: Secondary | ICD-10-CM | POA: Insufficient documentation

## 2011-08-03 DIAGNOSIS — R319 Hematuria, unspecified: Secondary | ICD-10-CM | POA: Insufficient documentation

## 2011-08-03 DIAGNOSIS — Z8673 Personal history of transient ischemic attack (TIA), and cerebral infarction without residual deficits: Secondary | ICD-10-CM | POA: Insufficient documentation

## 2011-08-03 DIAGNOSIS — I10 Essential (primary) hypertension: Secondary | ICD-10-CM | POA: Insufficient documentation

## 2011-08-03 LAB — URINALYSIS, ROUTINE W REFLEX MICROSCOPIC
Ketones, ur: NEGATIVE mg/dL
Nitrite: NEGATIVE
Protein, ur: 30 mg/dL — AB

## 2011-08-03 LAB — URINE MICROSCOPIC-ADD ON

## 2011-08-03 LAB — CBC
MCH: 30 pg (ref 26.0–34.0)
MCV: 86.3 fL (ref 78.0–100.0)
Platelets: 331 10*3/uL (ref 150–400)
RBC: 4.46 MIL/uL (ref 4.22–5.81)
RDW: 12.7 % (ref 11.5–15.5)
WBC: 11.2 10*3/uL — ABNORMAL HIGH (ref 4.0–10.5)

## 2011-08-04 LAB — COMPREHENSIVE METABOLIC PANEL WITH GFR
ALT: 18 U/L (ref 0–53)
AST: 18 U/L (ref 0–37)
Albumin: 3.9 g/dL (ref 3.5–5.2)
Alkaline Phosphatase: 112 U/L (ref 39–117)
BUN: 21 mg/dL (ref 6–23)
CO2: 29 meq/L (ref 19–32)
Calcium: 10.2 mg/dL (ref 8.4–10.5)
Chloride: 103 meq/L (ref 96–112)
Creatinine, Ser: 1.14 mg/dL (ref 0.50–1.35)
GFR calc Af Amer: >60 mL/min
GFR calc non Af Amer: >60 mL/min
Glucose, Bld: 130 mg/dL — ABNORMAL HIGH (ref 70–99)
Potassium: 3.2 meq/L — ABNORMAL LOW (ref 3.5–5.1)
Sodium: 140 meq/L (ref 135–145)
Total Bilirubin: 0.2 mg/dL — ABNORMAL LOW (ref 0.3–1.2)
Total Protein: 6.9 g/dL (ref 6.0–8.3)

## 2011-08-06 ENCOUNTER — Encounter: Payer: Self-pay | Admitting: Internal Medicine

## 2011-08-06 ENCOUNTER — Ambulatory Visit (INDEPENDENT_AMBULATORY_CARE_PROVIDER_SITE_OTHER): Payer: BC Managed Care – PPO | Admitting: Neurology

## 2011-08-06 ENCOUNTER — Other Ambulatory Visit (INDEPENDENT_AMBULATORY_CARE_PROVIDER_SITE_OTHER): Payer: BC Managed Care – PPO

## 2011-08-06 ENCOUNTER — Encounter: Payer: Self-pay | Admitting: Neurology

## 2011-08-06 ENCOUNTER — Telehealth: Payer: Self-pay

## 2011-08-06 ENCOUNTER — Ambulatory Visit (INDEPENDENT_AMBULATORY_CARE_PROVIDER_SITE_OTHER): Payer: BC Managed Care – PPO | Admitting: Internal Medicine

## 2011-08-06 VITALS — BP 140/100 | HR 92 | Ht 70.0 in | Wt 231.0 lb

## 2011-08-06 VITALS — BP 120/80 | HR 87 | Temp 98.2°F | Resp 16 | Ht 71.0 in | Wt 230.8 lb

## 2011-08-06 DIAGNOSIS — I639 Cerebral infarction, unspecified: Secondary | ICD-10-CM

## 2011-08-06 DIAGNOSIS — I6350 Cerebral infarction due to unspecified occlusion or stenosis of unspecified cerebral artery: Secondary | ICD-10-CM

## 2011-08-06 DIAGNOSIS — E876 Hypokalemia: Secondary | ICD-10-CM

## 2011-08-06 DIAGNOSIS — N410 Acute prostatitis: Secondary | ICD-10-CM

## 2011-08-06 DIAGNOSIS — R7303 Prediabetes: Secondary | ICD-10-CM | POA: Insufficient documentation

## 2011-08-06 DIAGNOSIS — IMO0001 Reserved for inherently not codable concepts without codable children: Secondary | ICD-10-CM

## 2011-08-06 LAB — BASIC METABOLIC PANEL
BUN: 14 mg/dL (ref 6–23)
CO2: 30 mEq/L (ref 19–32)
Chloride: 105 mEq/L (ref 96–112)
Glucose, Bld: 104 mg/dL — ABNORMAL HIGH (ref 70–99)
Potassium: 3.7 mEq/L (ref 3.5–5.1)
Sodium: 143 mEq/L (ref 135–145)

## 2011-08-06 LAB — MAGNESIUM: Magnesium: 1.9 mg/dL (ref 1.5–2.5)

## 2011-08-06 MED ORDER — CIPROFLOXACIN HCL 500 MG PO TABS: 500.0000 mg | ORAL_TABLET | Freq: Two times a day (BID) | ORAL | Status: AC

## 2011-08-06 MED ORDER — CIPROFLOXACIN HCL 500 MG PO TABS: 500.0000 mg | ORAL_TABLET | Freq: Two times a day (BID) | ORAL | Status: DC

## 2011-08-06 NOTE — Patient Instructions (Addendum)
We are working on your referral to Dr. Leonides Cave.  We will call you as soon as we hear from his office.  Their address [address]  [place].  Phone number is [phone]. We will call you once the doctor has reviewed your most recent MRI.

## 2011-08-06 NOTE — Telephone Encounter (Signed)
No meds needed yet, he has early diabetes and needs to do lifestyle modifications (diet, exercise, weight loss)

## 2011-08-06 NOTE — Telephone Encounter (Signed)
Patient called [facility] requesting lab results. Also requesting to know is any rx are needed

## 2011-08-06 NOTE — Patient Instructions (Signed)
Prostatitis Prostatitis is an inflammation (the body's way of reacting to injury and/or infection) of the prostate gland. The prostate gland is a male organ. The gland is about the size and shape of a walnut. The prostate is located just below the bladder. It produces semen, which is a fluid that helps nourish and transport sperm. Prostatitis is the most common urinary tract problem in men younger than age 48. There are 4 categories of prostatitis:  I - Acute bacterial prostatitis.   II - Chronic bacterial prostatitis.   III - Chronic prostatitis and chronic pelvic pain syndrome (CPPS).   Inflammatory.   Non inflammatory.   IV - Asymptomatic inflammatory prostatitis.  Acute and chronic bacterial prostatitis are problems with bacterial infections of the prostate. "Acute" infection is usually a one-time problem. "Chronic" bacterial prostatitis is a condition with recurrent infection. It is usually caused by the same germ (bacteria). CPPS has symptoms similar to prostate infection. However, no infection is actually found. This condition can cause problems of ongoing pain. Currently, it cannot be cured. Treatments are available and aimed at symptom control.  Asymptomatic inflammatory prostatitis has no symptoms. It is a condition where infection-fighting cells are found by chance in the urine. The diagnosis is made most often during an exam for other conditions. Other conditions could be infertility or a high level of PSA (prostate-specific antigen) in the blood. SYMPTOMS Symptoms can vary depending upon the type of prostatitis that exists. There can also be overlap in symptoms. This can make diagnosis difficult. Symptoms: For Acute bacterial prostatitis  Painful urination.  Fever and/or chills.   Muscle and/or joint pains.   Low back pain.   Low abdominal pain.   Inability to empty bladder completely.   Sudden urges to urinate.  Frequent urination during the day.   Difficulty  starting urine stream.   Need to urinate several times at night (nocturia).   Weak urine stream.   Urethral (tube that carries urine from the bladder out of the body) discharge and dribbling after urination.   For Chronic bacterial prostatitis  Rectal pain.   Pain in the testicles, penis, or tip of the penis.   Pain in the space between the anus and scrotum (perineum).   Low back pain.   Low abdominal pain.   Problems with sexual function.   Painful ejaculation.   Bloody semen.   Inability to empty bladder completely.   Painful urination.   Sudden urges to urinate.   Frequent urination during the day.   Difficulty starting urine stream.   Need to urinate several times at night (nocturia).   Weak urine stream.   Dribbling after urination.   Urethral discharge.   For Chronic prostatitis and chronic pelvic pain syndrome (CPPS) Symptoms are the same as those for chronic bacterial prostatitis. Problems with sexual function are often the reason for seeking care. This important problem should be discussed with your caregiver. For Asymptomatic inflammatory prostatitis As noted above, there are no symptoms with this condition. DIAGNOSIS  Your caregiver may perform a rectal exam. This exam is to determine if the prostate is swollen and tender.   Sometimes blood work is performed. This is done to see if your white blood cell count is elevated. The Prostate Specific Antigen (PSA) is also measured. PSA is a blood test that can help detect early prostate cancer.   A urinalysis is done to find out what type of infection is present if this is a suspected cause. An additional  urinalysis may be done after a digital rectal exam. This is to see if white blood cells are pushed out of the prostate and into the urine. A low-grade infection of the prostate may not be found on the first urinalysis.  In more difficult cases, your caregiver may advise other tests. Tests could  include:  Urodynamics -- Tests the function of the bladder and the organs involved in triggering and controlling normal urination.   Urine flow rate.   Cystoscopy -- In this procedure, a thin, telescope-like tube with a light and tiny camera attached (cystoscope) is inserted into the bladder through the urethra. This allows the caregiver to see the inside of the urethra and bladder.   Electromyography -- This procedure tests how the muscles and nerves of the bladder work. It is focused on the muscles that control the anus and pelvic floor. These are the muscles between the anus and scrotum.  In people who show no signs of infection, certain uncommon infections might be causing constant or recurrent symptoms. These uncommon infections are difficult to detect. More work in medicine may help find solutions to these problems. TREATMENT Antibiotics are used to treat infections caused by germs. If the infection is not treated and becomes long lasting (chronic), it may become a lower grade infection with minor, continual problems. Without treatment, the prostate may develop a boil or furuncle (abscess). This may require surgical treatment. For those with chronic prostatitis and CPPS, it is important to work closely with your primary caregiver and urologist. For some, the medicines that are used to treat a non-cancerous, enlarged prostate (benign prostatic hypertrophy) may be helpful. Referrals to specialists other than urologists may be necessary. In rare cases when all treatments have been inadequate for pain control, an operation to remove the prostate may be recommended. This is very rare and before this is considered thorough discussion with your urologist is highly recommended.  In cases of secondary to chronic non-bacterial prostatitis, a good relationship with your urologist or primary caregiver is essential because it is often a recurrent prolonged condition that requires a good understanding of the  causes and a commitment to therapy aimed at controlling your symptoms. HOME CARE INSTRUCTIONS  Hot sitz baths for 20 minutes, 4 times per day, may help relieve pain.   Non-prescription pain killers may be used as your caregiver recommends if you have no allergies to them. Some illnesses or conditions prevent use of non-prescription drugs. If unsure, check with your caregiver. Take all medications as directed. Take the antibiotics for the prescribed length of time, even if you are feeling better.  SEEK MEDICAL CARE IF:  You have any worsening of the symptoms that originally brought you to your caregiver.   You have an oral temperature above 100.5.   You experience any side effects from medications prescribed.  SEEK IMMEDIATE MEDICAL CARE IF: You have an oral temperature above 100.5Diabetes, Type 2 Diabetes is a lasting (chronic) disease. In type 2 diabetes, the pancreas does not make enough insulin (a hormone), and the body does not respond normally to the insulin that is made. This type of diabetes was also previously called adult onset diabetes. About 90% of all those who have diabetes have type 2. It usually occurs after the age of 77 but can occur at any age. CAUSES Unlike type 1 diabetes, which happens because insulin is no longer being made, type 2 diabetes happens because the body is making less insulin and has trouble using the insulin  properly. SYMPTOMS Drinking more than usual.  Urinating more than usual.  Blurred vision.  Dry, itchy skin.  Frequent infection like yeast infections in women.  More tired than usual (fatigue).  TREATMENT Healthy eating.  Exercise.  Medication, if needed.  Monitoring blood glucose (sugar).  Seeing your caregiver regularly.  HOME CARE INSTRUCTIONS Check your blood glucose (sugar) at least once daily. More frequent monitoring may be necessary, depending on your medications and on how well your diabetes is controlled. Your caregiver will advise you.   Take your medicine as directed by your caregiver.  Do not smoke.  Make wise food choices. Ask your caregiver for information. Weight loss can improve your diabetes.  Learn about low blood glucose (hypoglycemia) and how to treat it.  Get your eyes checked regularly.  Have a yearly physical exam. Have your blood pressure checked. Get your blood and urine tested.  Wear a pendant or bracelet saying that you have diabetes.  Check your feet every night for sores. Let your caregiver know if you have sores that are not healing.  SEEK MEDICAL CARE IF: You are having problems keeping your blood glucose at target range.  You feel you might be having problems with your medicines.  You have symptoms of an illness that is not improving after 24 hours.  You have a sore or wound that is not healing.  You notice a change in vision or a new problem with your vision.  You develop a fever of more than 100.5.  Document Released: 11/26/2005 Document Re-Released: 12/18/2009  [facility] Patient Information 2011 [place], [place]., not controlled by medicine.   You have pain not relieved with medications.   You develop nausea, vomiting, lightheadedness, or have a fainting episode.   You are unable to urinate.   You pass bloody urine or clots.  Document Released: 11/23/2000 Document Re-Released: 02/20/2010 [facility] Patient Information 2011 [place], [place].

## 2011-08-06 NOTE — Assessment & Plan Note (Signed)
I will check his A1C today and will consider starting a new med for him

## 2011-08-06 NOTE — Progress Notes (Signed)
Dear Dr. Clifton James,  Thank you for having me see [name] in consultation for his problems with transient loss. Mr. Draheim is a 50 year old right hand dominant man with a history of  ischemic stroke, coronary artery disease, hypertension and hyperlipidemia. Over the last several months he has had periods where he loses the vision in his left eye. He is certain that it is the left eye as he has covered both his left and right eye during a spell.  This can happen at any time during the day and can last hours. He describes it as a blurriness. He denies any precipitants such as bright light, changes in position, changes in directions of gaze or reading. There is no associated headache or eye pain. There is difficulty with language in both its understanding and its production during an event.  He describes that these events begin and end very suddenly.  Apparently he has had a normal eye exam since these problems began.  However he is unsure as to where this eye exam was done or whether it was done by an ophthalmologist or optometrist. He also says that he has had a recent MRI of his brain. However the only MRI I am able to find in our system is one done in March 2011. It was an MRI of his brain with an MRA of his head.  The results of the MRI are reviewed below  Medical history: 1. Hypertension 2. Coronary artery disease status post stenting 3. Hyperlipidemia 4. Ischemic stroke -The patient apparently suffered an ischemic stroke in 2011. He apparently had visual field loss as well as weakness on his left side. He's had problems with memory since that point in time. He's also had problems with significant fatigue and lack of motivation.   Family history:  Noncontributory.   Social history:  He uses tobacco but does not use alcohol. He is currently not working. Has not been able to work since his stroke.  He used to work for the school system.  Medications: Amlodipine, aspirin 325 mg, clopidogrel  75 mg, losartan, nitroglycerin, potassium chloride, simvastatin, ciprofloxacin  Exam: Filed Vitals:   08/06/11 0836  BP: 140/100  Pulse: 92   Gen.: Well-nourished well-appearing African American male in no acute distress. Head and neck: No carotid bruits no thyromegaly no scleral icterus or pale conjunctiva. No lymph nodes. Chest: Clear to auscultation bilaterally.   cardiovascular regular rate and rhythm peripheral radial pulses equal. Extremities: Mild pedal edema. Skin: No rashes. Neurological: Alert and oriented x3, moderate psychomotor slowing, seems abulic, pupils equally round and reactive to light, and no afferent pupillary defect. Funduscopy exam was limited by pupillary constriction. Visual acuity 20/30 OD and 20/70 OS uncorrected. Visual fields seem to be normal to confrontation in his right eye, but seem to be impaired in all 4 quadrants in the left.  Smooth pursuits normal. Mild ptosis bilaterally. Muscles of facial expression symmetric. Facial sensation to temperature decreased on the left. Shoulder shrug decreased on the left.  Significant leftward tongue deviation, ? Decreased palatal elevation on the left.  Motor: Normal bulk and tone. Rapid fine finger movements slower on the right than the left. Segmental motor exam reveals diffuse left sided weakness with some give away. The right side appears to be normal strength.  Reflexes: 1+ throughout both toes downgoing. Sensory testing reveals graphesthesia on the left as well as inconsistent neglect of the left to simultaneous sensory stimulation.there also seems to be decreased in sensation particularly to  temperature on the left hemibody.  Coordination: Reveals psychomotor slowing on finger to nose testing. There is no dysmetria.  Gait: Appears better than his motor examination would suggest. I do not find the same signs of hemiparesis.    I reviewed the MRI of his brain from March of 2011. There is an obvious infarction of the  right caudate head. The MRI revealed mild atherosclerotic disease except in the right M1 segment.  Impression: Mr. Mah is a 50 year old gentleman with a history of ischemic stroke who is now having paroxysmal episodes of ltransient visual changes in his left eye lasting hours accompanied by problems with language. They've been going on for several months. His examination reveals left hemibody weakness with a possible functional component.  This is apparently a residual from his old stroke and likely referable to the corticospinal tracks subserving that side. He does have a caudate infarction which can cause some slight weakness. However, what is confusing is that his history of a left sided visual field cut(during is original stroke) is not consistent with a caudate infarction.  I am also not convinced that what he is reporting as his visual loss during his current spells is not monocular visual loss, but rather a left field cut like what seemed to occur during his original stroke.  His visual examination reveals decreased acuity in his left eye uncorrected by pinhole.  We need to rule out a primary ophthalmologic problem, so I am going to get him to see an ophthalmologist for a dilated eye exam if he has not already seen one.  We need to also get recent vascular imaging, particularly of his neck in order to rule out a fixed stenotic lesion causing his TVL.  He alludes to having "lots of scans" so we will try to see if he has had a more recent vascular study -- we may need to ask his wife to get that information.  It would also be helpful to see a repeat MRI of his brain in particularly to assess his left sided weakness, which appears to be worse than his old caudate infarction would suggest. If recent scans are not available then I will request an MRI of his brain and MRA of his head and neck.  I think it is much less likely that this event is a cardioembolic phenomenon, particularly given its stereotyped  nature.  Seizure is also in the differential as well as migraine, but I would not get an EEG at this time.  Finally, it is possible that these events are "reactivation" events of his old deficit perhaps brought on by metabolic changes perhaps from something like low glucose levels.  It thus makes sense to have the patient check his glucose levels during one of these events.  Finally, with respect to his abulia and memory loss.  This can definitely be from his caudate infarction.  I am going to have him see Dr. Eula Flax, neuropsychology, to get a baseline on his abulia and determine if depression is also playing a role here.  Thank you for the consultation and feel free to call with any questions.  Lupita Raider Modesto Charon, MD [facility] Neurology, [place]

## 2011-08-06 NOTE — Assessment & Plan Note (Signed)
I will recheck his K+ today and will also see if low Mg+ is implicated

## 2011-08-06 NOTE — Assessment & Plan Note (Signed)
He was seen in the ER 3 days ago and he was told that the blood in his urine was due to a kidney stone, however today it appears to me that the stone is insignificant but that he has several s/s of acute bacterial prostatitis so I will start cipro and check a PSA and urine culture

## 2011-08-06 NOTE — Progress Notes (Signed)
Subjective:    Patient ID: Tony Levin., male    DOB: 1961-06-23, 50 y.o.   MRN: [id number]  Hematuria This is a new problem. The current episode started in the past 7 days. The problem is unchanged. He describes the hematuria as gross hematuria. The hematuria occurs throughout his entire urinary stream. He reports no clotting in his urine stream. His pain is at a severity of 0/10. He is experiencing no pain. He describes his urine color as light pink. Irritative symptoms include frequency, nocturia and urgency. Obstructive symptoms do not include dribbling, incomplete emptying, an intermittent stream, a slower stream, straining or a weak stream. Pertinent negatives include no abdominal pain, chills, dysuria, facial swelling, fever, flank pain, genital pain, hesitancy, inability to urinate, nausea or vomiting. He is not sexually active. There is no history of BPH, GU trauma, hypertension, kidney stones, prostatitis, recent infection, sickle cell disease, STDs or tobacco use.      Review of Systems  Constitutional: Negative for fever, chills, diaphoresis, activity change, appetite change, fatigue and unexpected weight change.  HENT: Negative for sore throat, facial swelling, trouble swallowing, neck pain and voice change.   Eyes: Negative for photophobia, pain, discharge, redness, itching and visual disturbance.  Respiratory: Negative for apnea, cough, choking, chest tightness, shortness of breath, wheezing and stridor.   Cardiovascular: Negative for chest pain, palpitations and leg swelling.  Gastrointestinal: Negative for nausea, vomiting, abdominal pain, diarrhea, constipation, blood in stool, abdominal distention, anal bleeding and rectal pain.  Genitourinary: Positive for urgency, frequency, hematuria and nocturia. Negative for dysuria, hesitancy, flank pain, decreased urine volume, discharge, penile swelling, scrotal swelling, difficulty urinating, genital sores, penile pain, testicular  pain and incomplete emptying.  Musculoskeletal: Negative for myalgias, back pain, joint swelling, arthralgias and gait problem.  Neurological: Negative for dizziness, tremors, seizures, syncope, facial asymmetry, speech difficulty, weakness, light-headedness, numbness and headaches.  Hematological: Negative for adenopathy. Does not bruise/bleed easily.  Psychiatric/Behavioral: Negative.        Objective:   Physical Exam  Vitals reviewed. Constitutional: He is oriented to person, place, and time. He appears well-developed and well-nourished. No distress.  HENT:  Head: Normocephalic and atraumatic.  Mouth/Throat: No oropharyngeal exudate.  Eyes: Conjunctivae are normal. Right eye exhibits no discharge. Left eye exhibits no discharge. No scleral icterus.  Neck: Normal range of motion. Neck supple. No JVD present. No tracheal deviation present. No thyromegaly present.  Cardiovascular: Normal rate, regular rhythm, normal heart sounds and intact distal pulses.  Exam reveals no gallop and no friction rub.   No murmur heard. Pulmonary/Chest: Effort normal and breath sounds normal. No stridor. No respiratory distress. He has no wheezes. He has no rales. He exhibits no tenderness.  Abdominal: Soft. Bowel sounds are normal. He exhibits no distension and no mass. There is no tenderness. There is no rebound and no guarding. Hernia confirmed negative in the right inguinal area and confirmed negative in the left inguinal area.  Genitourinary: Rectum normal and penis normal. Rectal exam shows no external hemorrhoid, no internal hemorrhoid, no fissure, no mass, no tenderness and anal tone normal. Guaiac negative stool. Prostate is enlarged (2+ hypertrophy with bogginess and ttp) and tender. Right testis shows no mass, no swelling and no tenderness. Right testis is descended. Cremasteric reflex is not absent on the right side. Left testis shows no mass, no swelling and no tenderness. Left testis is descended.  Cremasteric reflex is not absent on the left side. Uncircumcised. No phimosis, paraphimosis, hypospadias, penile erythema  or penile tenderness. No discharge found.  Musculoskeletal: Normal range of motion. He exhibits no edema and no tenderness.  Lymphadenopathy:    He has no cervical adenopathy.       Right: No inguinal adenopathy present.       Left: No inguinal adenopathy present.  Neurological: He is alert and oriented to person, place, and time. He has normal reflexes. He displays normal reflexes. No cranial nerve deficit. He exhibits normal muscle tone. Coordination normal.  Skin: Skin is warm and dry. No rash noted. He is not diaphoretic. No erythema. No pallor.  Psychiatric: He has a normal mood and affect. His behavior is normal. Judgment and thought content normal.      Lab Results  Component Value Date   WBC 11.2* 08/03/2011   HGB 13.4 08/03/2011   HCT 38.5* 08/03/2011   PLT 331 08/03/2011   ALT 18 08/03/2011   AST 18 08/03/2011   NA 140 08/03/2011   K 3.2* 08/03/2011   CL 103 08/03/2011   CREATININE 1.14 08/03/2011   BUN 21 08/03/2011   CO2 29 08/03/2011   INR 1.1 ratio* 10/09/2010      Assessment & Plan:

## 2011-08-07 ENCOUNTER — Telehealth: Payer: Self-pay

## 2011-08-07 DIAGNOSIS — G453 Amaurosis fugax: Secondary | ICD-10-CM

## 2011-08-07 NOTE — Telephone Encounter (Signed)
Thanks Matt!

## 2011-08-08 ENCOUNTER — Telehealth: Payer: Self-pay | Admitting: *Deleted

## 2011-08-08 LAB — CULTURE, URINE COMPREHENSIVE

## 2011-08-08 NOTE — Telephone Encounter (Signed)
Patient notified and appt set up

## 2011-08-08 NOTE — Telephone Encounter (Signed)
Pt is requesting lab work results from Monday.

## 2011-08-08 NOTE — Telephone Encounter (Signed)
Low potassium, high WBC count, urine looks infected

## 2011-08-08 NOTE — Telephone Encounter (Signed)
Patient notified and has appt set for follow up in two weeks

## 2011-08-08 NOTE — Telephone Encounter (Signed)
Patient notified of lab results per MD. Patient would like to know when he should follow up again?

## 2011-08-08 NOTE — Telephone Encounter (Signed)
In 2 weeks.

## 2011-08-10 ENCOUNTER — Other Ambulatory Visit: Payer: Self-pay

## 2011-08-10 DIAGNOSIS — I639 Cerebral infarction, unspecified: Secondary | ICD-10-CM

## 2011-08-10 DIAGNOSIS — G453 Amaurosis fugax: Secondary | ICD-10-CM

## 2011-08-10 NOTE — Telephone Encounter (Signed)
Addended by: Lelon Huh on: 08/10/2011 12:41 PM   Modules accepted: Orders

## 2011-08-10 NOTE — Telephone Encounter (Addendum)
Hi [name],  Can you book MRI brain and MRA head and neck for Tony Davis since he has not had one.  Thanks,  matt

## 2011-08-10 NOTE — Telephone Encounter (Signed)
Addended by: Milas Gain on: 08/10/2011 11:28 AM   Modules accepted: Orders

## 2011-08-10 NOTE — Telephone Encounter (Signed)
Pt made aware of appt.  See other documentation

## 2011-08-10 NOTE — Telephone Encounter (Signed)
Per Sunny Schlein at Radiology his MRA neck needs to be with and without so cancelled the without and scheduled w/ and w/o.  Scheduled for Friday, Sept 7th at 9:00am [facility].  Arrive by 8:45am  Pt notified of appt.

## 2011-08-10 NOTE — Progress Notes (Signed)
Addended by: Lelon Huh on: 08/10/2011 11:42 AM   Modules accepted: Orders

## 2011-08-15 DIAGNOSIS — R413 Other amnesia: Secondary | ICD-10-CM

## 2011-08-15 DIAGNOSIS — I6350 Cerebral infarction due to unspecified occlusion or stenosis of unspecified cerebral artery: Secondary | ICD-10-CM

## 2011-08-15 DIAGNOSIS — F329 Major depressive disorder, single episode, unspecified: Secondary | ICD-10-CM

## 2011-08-16 ENCOUNTER — Encounter: Payer: Self-pay | Admitting: Internal Medicine

## 2011-08-16 ENCOUNTER — Ambulatory Visit (INDEPENDENT_AMBULATORY_CARE_PROVIDER_SITE_OTHER): Payer: BC Managed Care – PPO | Admitting: Internal Medicine

## 2011-08-16 VITALS — BP 132/82 | HR 80 | Temp 98.4°F | Resp 16 | Wt 236.0 lb

## 2011-08-16 DIAGNOSIS — I10 Essential (primary) hypertension: Secondary | ICD-10-CM

## 2011-08-16 DIAGNOSIS — G4733 Obstructive sleep apnea (adult) (pediatric): Secondary | ICD-10-CM | POA: Insufficient documentation

## 2011-08-16 DIAGNOSIS — IMO0001 Reserved for inherently not codable concepts without codable children: Secondary | ICD-10-CM

## 2011-08-16 DIAGNOSIS — I6350 Cerebral infarction due to unspecified occlusion or stenosis of unspecified cerebral artery: Secondary | ICD-10-CM

## 2011-08-16 DIAGNOSIS — Z9989 Dependence on other enabling machines and devices: Secondary | ICD-10-CM

## 2011-08-16 DIAGNOSIS — N410 Acute prostatitis: Secondary | ICD-10-CM

## 2011-08-16 MED ORDER — SILODOSIN 8 MG PO CAPS: 8.0000 mg | ORAL_CAPSULE | Freq: Every day | ORAL | Status: DC

## 2011-08-16 NOTE — Patient Instructions (Signed)
Prostatitis Prostatitis is an inflammation (the body's way of reacting to injury and/or infection) of the prostate gland. The prostate gland is a male organ. The gland is about the size and shape of a walnut. The prostate is located just below the bladder. It produces semen, which is a fluid that helps nourish and transport sperm. Prostatitis is the most common urinary tract problem in men younger than age 48. There are 4 categories of prostatitis:  I - Acute bacterial prostatitis.   II - Chronic bacterial prostatitis.   III - Chronic prostatitis and chronic pelvic pain syndrome (CPPS).   Inflammatory.   Non inflammatory.   IV - Asymptomatic inflammatory prostatitis.  Acute and chronic bacterial prostatitis are problems with bacterial infections of the prostate. "Acute" infection is usually a one-time problem. "Chronic" bacterial prostatitis is a condition with recurrent infection. It is usually caused by the same germ (bacteria). CPPS has symptoms similar to prostate infection. However, no infection is actually found. This condition can cause problems of ongoing pain. Currently, it cannot be cured. Treatments are available and aimed at symptom control.  Asymptomatic inflammatory prostatitis has no symptoms. It is a condition where infection-fighting cells are found by chance in the urine. The diagnosis is made most often during an exam for other conditions. Other conditions could be infertility or a high level of PSA (prostate-specific antigen) in the blood. SYMPTOMS Symptoms can vary depending upon the type of prostatitis that exists. There can also be overlap in symptoms. This can make diagnosis difficult. Symptoms: For Acute bacterial prostatitis  Painful urination.  Fever and/or chills.   Muscle and/or joint pains.   Low back pain.   Low abdominal pain.   Inability to empty bladder completely.   Sudden urges to urinate.  Frequent urination during the day.   Difficulty  starting urine stream.   Need to urinate several times at night (nocturia).   Weak urine stream.   Urethral (tube that carries urine from the bladder out of the body) discharge and dribbling after urination.   For Chronic bacterial prostatitis  Rectal pain.   Pain in the testicles, penis, or tip of the penis.   Pain in the space between the anus and scrotum (perineum).   Low back pain.   Low abdominal pain.   Problems with sexual function.   Painful ejaculation.   Bloody semen.   Inability to empty bladder completely.   Painful urination.   Sudden urges to urinate.   Frequent urination during the day.   Difficulty starting urine stream.   Need to urinate several times at night (nocturia).   Weak urine stream.   Dribbling after urination.   Urethral discharge.   For Chronic prostatitis and chronic pelvic pain syndrome (CPPS) Symptoms are the same as those for chronic bacterial prostatitis. Problems with sexual function are often the reason for seeking care. This important problem should be discussed with your caregiver. For Asymptomatic inflammatory prostatitis As noted above, there are no symptoms with this condition. DIAGNOSIS  Your caregiver may perform a rectal exam. This exam is to determine if the prostate is swollen and tender.   Sometimes blood work is performed. This is done to see if your white blood cell count is elevated. The Prostate Specific Antigen (PSA) is also measured. PSA is a blood test that can help detect early prostate cancer.   A urinalysis is done to find out what type of infection is present if this is a suspected cause. An additional   urinalysis may be done after a digital rectal exam. This is to see if white blood cells are pushed out of the prostate and into the urine. A low-grade infection of the prostate may not be found on the first urinalysis.  In more difficult cases, your caregiver may advise other tests. Tests could  include:  Urodynamics -- Tests the function of the bladder and the organs involved in triggering and controlling normal urination.   Urine flow rate.   Cystoscopy -- In this procedure, a thin, telescope-like tube with a light and tiny camera attached (cystoscope) is inserted into the bladder through the urethra. This allows the caregiver to see the inside of the urethra and bladder.   Electromyography -- This procedure tests how the muscles and nerves of the bladder work. It is focused on the muscles that control the anus and pelvic floor. These are the muscles between the anus and scrotum.  In people who show no signs of infection, certain uncommon infections might be causing constant or recurrent symptoms. These uncommon infections are difficult to detect. More work in medicine may help find solutions to these problems. TREATMENT Antibiotics are used to treat infections caused by germs. If the infection is not treated and becomes long lasting (chronic), it may become a lower grade infection with minor, continual problems. Without treatment, the prostate may develop a boil or furuncle (abscess). This may require surgical treatment. For those with chronic prostatitis and CPPS, it is important to work closely with your primary caregiver and urologist. For some, the medicines that are used to treat a non-cancerous, enlarged prostate (benign prostatic hypertrophy) may be helpful. Referrals to specialists other than urologists may be necessary. In rare cases when all treatments have been inadequate for pain control, an operation to remove the prostate may be recommended. This is very rare and before this is considered thorough discussion with your urologist is highly recommended.  In cases of secondary to chronic non-bacterial prostatitis, a good relationship with your urologist or primary caregiver is essential because it is often a recurrent prolonged condition that requires a good understanding of the  causes and a commitment to therapy aimed at controlling your symptoms. HOME CARE INSTRUCTIONS  Hot sitz baths for 20 minutes, 4 times per day, may help relieve pain.   Non-prescription pain killers may be used as your caregiver recommends if you have no allergies to them. Some illnesses or conditions prevent use of non-prescription drugs. If unsure, check with your caregiver. Take all medications as directed. Take the antibiotics for the prescribed length of time, even if you are feeling better.  SEEK MEDICAL CARE IF:  You have any worsening of the symptoms that originally brought you to your caregiver.   You have an oral temperature above 100.5.   You experience any side effects from medications prescribed.  SEEK IMMEDIATE MEDICAL CARE IF:  You have an oral temperature above 100.5, not controlled by medicine.   You have pain not relieved with medications.   You develop nausea, vomiting, lightheadedness, or have a fainting episode.   You are unable to urinate.   You pass bloody urine or clots.  Document Released: 11/23/2000 Document Re-Released: 02/20/2010 [employer] Patient Information 2011 [employer]. 

## 2011-08-16 NOTE — Progress Notes (Signed)
  Subjective:    Patient ID: Tony Davis., male    DOB: [birth year], 50 y.o.   MRN: [id number]  HPI He returns complaining of persistent urinary frequency and nocturia (every 2 hours). He has trouble sleeping and he admits that he is not using his CPAP machine - he has DFA and FA's with snoring and apnea. He also has severe daytime fatigue.   Review of Systems  Constitutional: Positive for fatigue. Negative for fever, chills, diaphoresis, activity change, appetite change and unexpected weight change.  HENT: Negative.   Eyes: Negative.   Respiratory: Positive for apnea. Negative for cough, choking, chest tightness, shortness of breath, wheezing and stridor.   Cardiovascular: Negative for chest pain, palpitations and leg swelling.  Gastrointestinal: Negative for nausea, vomiting, abdominal pain, diarrhea, constipation, blood in stool and anal bleeding.  Genitourinary: Positive for frequency and difficulty urinating. Negative for dysuria, urgency, hematuria, flank pain, decreased urine volume, discharge, penile swelling, scrotal swelling, enuresis, penile pain and testicular pain.  Musculoskeletal: Negative for myalgias, back pain, joint swelling, arthralgias and gait problem.  Skin: Negative for color change, pallor, rash and wound.  Neurological: Negative.   Hematological: Negative.   Psychiatric/Behavioral: Negative.        Objective:   Physical Exam  Vitals reviewed. Constitutional: He is oriented to person, place, and time. He appears well-developed and well-nourished. No distress.  HENT:  Mouth/Throat: Oropharynx is clear and moist. No oropharyngeal exudate.  Eyes: Conjunctivae are normal. Right eye exhibits no discharge. Left eye exhibits no discharge. No scleral icterus.  Neck: Normal range of motion. Neck supple. No JVD present. No tracheal deviation present. No thyromegaly present.  Cardiovascular: Normal rate, regular rhythm, normal heart sounds and intact distal pulses.   Exam reveals no gallop and no friction rub.   No murmur heard. Pulmonary/Chest: Effort normal and breath sounds normal. No stridor. No respiratory distress. He has no wheezes. He has no rales. He exhibits no tenderness.  Abdominal: Soft. Bowel sounds are normal. He exhibits no distension and no mass. There is no tenderness. There is no rebound and no guarding.  Musculoskeletal: Normal range of motion. He exhibits no edema.  Lymphadenopathy:    He has no cervical adenopathy.  Neurological: He is oriented to person, place, and time. He displays normal reflexes. He exhibits normal muscle tone. Coordination normal.  Skin: Skin is warm and dry. No rash noted. He is not diaphoretic. No erythema. No pallor.  Psychiatric: He has a normal mood and affect. His behavior is normal. Judgment and thought content normal.      Lab Results  Component Value Date   WBC 11.2* 08/03/2011   HGB 13.4 08/03/2011   HCT 38.5* 08/03/2011   PLT 331 08/03/2011   ALT 18 08/03/2011   AST 18 08/03/2011   NA 143 08/06/2011   K 3.7 08/06/2011   CL 105 08/06/2011   CREATININE 1.0 08/06/2011   BUN 14 08/06/2011   CO2 30 08/06/2011   PSA 0.38 08/06/2011   INR 1.1 ratio* 10/09/2010   HGBA1C 6.1 08/06/2011      Assessment & Plan:

## 2011-08-17 ENCOUNTER — Ambulatory Visit (HOSPITAL_COMMUNITY): Admission: RE | Admit: 2011-08-17 | Payer: BC Managed Care – PPO | Source: Ambulatory Visit

## 2011-08-17 ENCOUNTER — Other Ambulatory Visit: Payer: Self-pay | Admitting: Neurology

## 2011-08-17 ENCOUNTER — Ambulatory Visit (HOSPITAL_COMMUNITY)
Admission: RE | Admit: 2011-08-17 | Discharge: 2011-08-17 | Disposition: A | Discharge status: Home / Self Care | Payer: BC Managed Care – PPO | Source: Ambulatory Visit | Attending: Neurology | Admitting: Neurology

## 2011-08-17 DIAGNOSIS — J3489 Other specified disorders of nose and nasal sinuses: Secondary | ICD-10-CM | POA: Insufficient documentation

## 2011-08-17 DIAGNOSIS — H53129 Transient visual loss, unspecified eye: Secondary | ICD-10-CM | POA: Insufficient documentation

## 2011-08-17 DIAGNOSIS — R4789 Other speech disturbances: Secondary | ICD-10-CM | POA: Insufficient documentation

## 2011-08-17 DIAGNOSIS — G453 Amaurosis fugax: Secondary | ICD-10-CM

## 2011-08-17 MED ORDER — GADOBENATE DIMEGLUMINE 529 MG/ML IV SOLN
20.0000 mL | Freq: Once | INTRAVENOUS | Status: AC
  Administered 2011-08-17: 20 mL via INTRAVENOUS

## 2011-08-18 ENCOUNTER — Encounter: Payer: Self-pay | Admitting: Neurology

## 2011-08-19 ENCOUNTER — Encounter: Payer: Self-pay | Admitting: Internal Medicine

## 2011-08-19 NOTE — Assessment & Plan Note (Signed)
Dr. Modesto Charon is evaluating this

## 2011-08-19 NOTE — Assessment & Plan Note (Signed)
The infection is being treated but he still has s/s of prostatism so I will offer him rapaflo for symptom relief

## 2011-08-19 NOTE — Assessment & Plan Note (Signed)
He will manage this will lifestyle modifications

## 2011-08-19 NOTE — Assessment & Plan Note (Signed)
He will see sleep med for an updated OSA/CPAP evaluation

## 2011-08-19 NOTE — Assessment & Plan Note (Signed)
His BP is well controlled 

## 2011-08-31 ENCOUNTER — Institutional Professional Consult (permissible substitution): Payer: BC Managed Care – PPO | Admitting: Cardiovascular Disease

## 2011-09-03 ENCOUNTER — Institutional Professional Consult (permissible substitution): Payer: BC Managed Care – PPO | Admitting: Pulmonary Disease

## 2011-09-19 ENCOUNTER — Institutional Professional Consult (permissible substitution): Payer: BC Managed Care – PPO | Admitting: Pulmonary Disease

## 2011-10-16 ENCOUNTER — Institutional Professional Consult (permissible substitution): Payer: BC Managed Care – PPO | Admitting: Pulmonary Disease

## 2011-12-11 DIAGNOSIS — I82409 Acute embolism and thrombosis of unspecified deep veins of unspecified lower extremity: Secondary | ICD-10-CM

## 2011-12-11 HISTORY — DX: Acute embolism and thrombosis of unspecified deep veins of unspecified lower extremity: I82.409

## 2012-03-13 ENCOUNTER — Encounter: Payer: Self-pay | Admitting: Cardiovascular Disease

## 2012-03-16 ENCOUNTER — Telehealth: Payer: Self-pay | Admitting: Cardiology

## 2012-03-16 NOTE — Telephone Encounter (Signed)
Returned a call from Tony Davis. He reports being on vacation in [place], [place] last week and went to the hospital Tuesday (03/11/12) for left sided weakness. They diagnosed him with a stroke as well as LE DVT. He states he requested they discharge him from the hospital so he could return to [place]. According to him they did not want to release him but he told them he would notify his doctors in [place] if they released him. They released him yesterday (03/15/12). He was calling for advice on what he should do now. I informed him that as it is a Sunday we can not see him in the office and he will need to go to the hospital for evaluation. He wanted to know who the neurologist was who saw him last I and gave him Dr. Nash Dimmer name. He stated he would like to call his office and see if they could just see him in the office tomorrow. If he can not get in touch with someone at that office he agreed to go to the hospital.  Berton Mount PA-C 03/16/2012 5:56 PM

## 2012-03-17 ENCOUNTER — Emergency Department (HOSPITAL_COMMUNITY)
Admission: EM | Admit: 2012-03-17 | Discharge: 2012-03-17 | Disposition: A | Discharge status: Home / Self Care | Payer: BC Managed Care – PPO | Attending: Emergency Medicine | Admitting: Emergency Medicine

## 2012-03-17 ENCOUNTER — Other Ambulatory Visit: Payer: Self-pay

## 2012-03-17 ENCOUNTER — Encounter (HOSPITAL_COMMUNITY): Payer: Self-pay | Admitting: Emergency Medicine

## 2012-03-17 ENCOUNTER — Telehealth: Payer: Self-pay | Admitting: Neurology

## 2012-03-17 ENCOUNTER — Emergency Department (HOSPITAL_COMMUNITY): Payer: BC Managed Care – PPO

## 2012-03-17 DIAGNOSIS — Z86718 Personal history of other venous thrombosis and embolism: Secondary | ICD-10-CM | POA: Insufficient documentation

## 2012-03-17 DIAGNOSIS — R51 Headache: Secondary | ICD-10-CM | POA: Insufficient documentation

## 2012-03-17 DIAGNOSIS — Z7982 Long term (current) use of aspirin: Secondary | ICD-10-CM | POA: Insufficient documentation

## 2012-03-17 DIAGNOSIS — R209 Unspecified disturbances of skin sensation: Secondary | ICD-10-CM | POA: Insufficient documentation

## 2012-03-17 DIAGNOSIS — R531 Weakness: Secondary | ICD-10-CM

## 2012-03-17 DIAGNOSIS — Z79899 Other long term (current) drug therapy: Secondary | ICD-10-CM | POA: Insufficient documentation

## 2012-03-17 DIAGNOSIS — I82409 Acute embolism and thrombosis of unspecified deep veins of unspecified lower extremity: Secondary | ICD-10-CM | POA: Insufficient documentation

## 2012-03-17 DIAGNOSIS — H538 Other visual disturbances: Secondary | ICD-10-CM | POA: Insufficient documentation

## 2012-03-17 DIAGNOSIS — R29898 Other symptoms and signs involving the musculoskeletal system: Secondary | ICD-10-CM | POA: Insufficient documentation

## 2012-03-17 DIAGNOSIS — R269 Unspecified abnormalities of gait and mobility: Secondary | ICD-10-CM | POA: Insufficient documentation

## 2012-03-17 DIAGNOSIS — Z7901 Long term (current) use of anticoagulants: Secondary | ICD-10-CM | POA: Insufficient documentation

## 2012-03-17 DIAGNOSIS — I10 Essential (primary) hypertension: Secondary | ICD-10-CM | POA: Insufficient documentation

## 2012-03-17 DIAGNOSIS — E785 Hyperlipidemia, unspecified: Secondary | ICD-10-CM | POA: Insufficient documentation

## 2012-03-17 DIAGNOSIS — M542 Cervicalgia: Secondary | ICD-10-CM | POA: Insufficient documentation

## 2012-03-17 DIAGNOSIS — K219 Gastro-esophageal reflux disease without esophagitis: Secondary | ICD-10-CM | POA: Insufficient documentation

## 2012-03-17 DIAGNOSIS — E119 Type 2 diabetes mellitus without complications: Secondary | ICD-10-CM | POA: Insufficient documentation

## 2012-03-17 DIAGNOSIS — Z8673 Personal history of transient ischemic attack (TIA), and cerebral infarction without residual deficits: Secondary | ICD-10-CM | POA: Insufficient documentation

## 2012-03-17 DIAGNOSIS — I2510 Atherosclerotic heart disease of native coronary artery without angina pectoris: Secondary | ICD-10-CM | POA: Insufficient documentation

## 2012-03-17 HISTORY — DX: Unspecified osteoarthritis, unspecified site: M19.90

## 2012-03-17 LAB — URINE MICROSCOPIC-ADD ON

## 2012-03-17 LAB — CBC
MCH: 30.2 pg (ref 26.0–34.0)
Platelets: 310 10*3/uL (ref 150–400)
RBC: 4.6 MIL/uL (ref 4.22–5.81)
WBC: 6.4 10*3/uL (ref 4.0–10.5)

## 2012-03-17 LAB — BASIC METABOLIC PANEL
Chloride: 101 mEq/L (ref 96–112)
GFR calc non Af Amer: >90 mL/min (ref >90)
Glucose, Bld: 131 mg/dL — ABNORMAL HIGH (ref 70–99)
Potassium: 3.8 mEq/L (ref 3.5–5.1)
Sodium: 138 mEq/L (ref 135–145)

## 2012-03-17 LAB — DIFFERENTIAL
Eosinophils Absolute: 0.2 10*3/uL (ref 0.0–0.7)
Lymphocytes Relative: 36 % (ref 12–46)
Lymphs Abs: 2.3 10*3/uL (ref 0.7–4.0)
Neutro Abs: 3.8 10*3/uL (ref 1.7–7.7)
Neutrophils Relative %: 58 % (ref 43–77)

## 2012-03-17 LAB — URINALYSIS, ROUTINE W REFLEX MICROSCOPIC
Nitrite: NEGATIVE
Specific Gravity, Urine: 1.018 (ref 1.005–1.030)
Urobilinogen, UA: 1 mg/dL (ref 0.0–1.0)

## 2012-03-17 LAB — PROTIME-INR
INR: 1.01 (ref 0.00–1.49)
Prothrombin Time: 13.5 seconds (ref 11.6–15.2)

## 2012-03-17 LAB — POCT I-STAT TROPONIN I: Troponin i, poc: 0.01 ng/mL (ref 0.00–0.08)

## 2012-03-17 NOTE — Telephone Encounter (Signed)
Received call yesterday, Sunday, from patient regarding status in previous note by J. Hope, PA-C.  Patient stated that he had discharge paperwork from [facility].  I advised him to take the paperwork with him and to go directly to the hospital.

## 2012-03-17 NOTE — Telephone Encounter (Signed)
Pt suffered stroke this week in [place], [place] while on vacation. He was admitted to hospital, but left to come back to [facility] so his wife could return to work. [facility] hospital advised him to get in touch with his neurologist and that he should probably go back to hospital, although patient has not been admitted in town. Pt states that it was a "mild stroke" and has been giving himself injections in the stomach. He has all paperwork from hospital admission as well as a disc of his imaging.   Should patient come into office or go directly to hospital? Should we set up a fu appt for pt right away? Please advise.

## 2012-03-17 NOTE — Telephone Encounter (Signed)
If he was advised by another hospital to go to hospital then he should do that.  Jan - could you call and let him know.

## 2012-03-17 NOTE — ED Notes (Signed)
Pt st's he could not complete the MRI due to feeling like he could not breath.  Pt st's tech said they were able to get enough images.  At this time pt c/o weakness in left arm and left leg.

## 2012-03-17 NOTE — ED Notes (Signed)
Pt alert and oriented x's 3, skin warm and dry color appropriate.

## 2012-03-17 NOTE — ED Provider Notes (Signed)
History     CSN: [id number]  Arrival date & time 03/17/12  1206   First MD Initiated Contact with Patient 03/17/12 1241      Chief Complaint  Patient presents with  . Numbness    (Consider location/radiation/quality/duration/timing/severity/associated sxs/prior treatment) Patient is a 51 y.o. male presenting with extremity weakness. The history is provided by the patient. No language interpreter was used.  Extremity Weakness This is a new problem. The current episode started in the past 7 days. The problem occurs constantly. The problem has been gradually improving. Associated symptoms include headaches, numbness and weakness. Pertinent negatives include no abdominal pain, chest pain, chills, coughing, fever, nausea, neck pain, visual change or vomiting. The symptoms are aggravated by walking.   52 year old male coming in today with left-sided weakness. Patient was on vacation in [place] last week and suffered a TIA versus a small right MCA stroke. States that he was treated in the [facility] hospital in [place] until Saturday. They also found a LLE dvt. He then was taken off plavix from a prior stroke and went home on levonox and then called his neurologist here Dr. Modesto Charon.  Has come in today to be admitted after speaking with his neurologist Dr. Modesto Charon.   He also has a left lower extremity DVT. Patient suffered a headache for 2 days and left-sided neck pain prior to the stroke last Tuesday with blurred vision. He also had bilateral lower extremity numbness prior to the stroke. Patient had a stroke one year ago but all symptoms had resolved prior to last Tuesday. Records from [place] showed that he has a has had a TIA versus small right MCA stroke. Patient also has a stent in a "hole" in his heart and sees Dr. Clifton James [facility].  A&O x 3 MAE with LLE weakness and a LUE drift. Remains hypertensive 173/99.   No droop.  Blurred vision, h/a and numbness has resolved as well.  States that the  symptoms are much resolved from last Tuesday.  Pt is a smoker.    Past Medical History  Diagnosis Date  . CAD (coronary artery disease)     cath 10/11 with mild LAD, 80% mid circ, PLA 70%. no s/p promus DES circumflex artery 10/13/10  . GERD (gastroesophageal reflux disease)   . HLD (hyperlipidemia)   . HTN (hypertension)   . DM (diabetes mellitus)     boarderline  . Tobacco abuse   . CVA (cerebral vascular accident) 3/11  . TIA (transient ischemic attack) 3/11  . Hematuria 8/12    Past Surgical History  Procedure Date  . Knee arthroscopy     2 on left and 3 on rt    Family History  Problem Relation Age of Onset  . Cancer Neg Hx   . Kidney disease Neg Hx   . Diabetes Neg Hx     History  Substance Use Topics  . Smoking status: Current Everyday Smoker -- 1.0 packs/day for 26 years    Types: Cigarettes  . Smokeless tobacco: Never Used   Comment: 1/2 ppd x 30 years   . Alcohol Use: No      Review of Systems  Constitutional: Negative for fever and chills.  HENT: Negative for ear pain, rhinorrhea, neck pain and neck stiffness.   Eyes: Negative.   Respiratory: Negative.  Negative for cough.   Cardiovascular: Negative.  Negative for chest pain.  Gastrointestinal: Negative.  Negative for nausea, vomiting and abdominal pain.  Genitourinary: Negative for difficulty urinating.  Musculoskeletal: Positive for gait problem and extremity weakness.  Skin: Negative.   Neurological: Positive for weakness, numbness and headaches. Negative for dizziness, seizures and speech difficulty.  Psychiatric/Behavioral: Negative.   All other systems reviewed and are negative.    Allergies  Iodine  Home Medications   Current Outpatient Rx  Name Route Sig Dispense Refill  . AMLODIPINE BESYLATE 5 MG PO TABS Oral Take 5 mg by mouth daily.    . ASPIRIN 325 MG PO TBEC Oral Take 325 mg by mouth daily.      . ATORVASTATIN CALCIUM 80 MG PO TABS Oral Take 80 mg by mouth at bedtime.    Marland Kitchen  ENOXAPARIN SODIUM 100 MG/ML  SOLN Subcutaneous Inject 100 mg into the skin every 12 (twelve) hours. For 7 days. Starting 03/15/12 Ending 03/22/12    . LOSARTAN POTASSIUM 100 MG PO TABS Oral Take 100 mg by mouth daily.      Marland Kitchen NITROGLYCERIN 0.4 MG SL SUBL Sublingual Place 0.4 mg under the tongue every 5 (five) minutes as needed.      Marland Kitchen POTASSIUM CHLORIDE CRYS ER 20 MEQ PO TBCR Oral Take 20 mEq by mouth daily.     . WARFARIN SODIUM 5 MG PO TABS Oral Take 5 mg by mouth daily.      BP 173/99  Pulse 84  Temp(Src) 98.3 F (36.8 C) (Oral)  Resp 20  SpO2 97%  Physical Exam  Nursing note and vitals reviewed. Constitutional: He is oriented to person, place, and time. He appears well-developed and well-nourished.  HENT:  Head: Normocephalic.  Eyes: Conjunctivae and EOM are normal. Pupils are equal, round, and reactive to light.  Neck: Normal range of motion. Neck supple.  Cardiovascular: Normal rate.   Pulmonary/Chest: Effort normal.  Abdominal: Soft.  Musculoskeletal: Normal range of motion.  Neurological: He is alert and oriented to person, place, and time. A cranial nerve deficit is present. No sensory deficit. He exhibits abnormal muscle tone. Gait abnormal. GCS eye subscore is 4. GCS verbal subscore is 5. GCS motor subscore is 6.       RUE < RLE weakness.  RLE calf pain.  RUE drift.  Skin: Skin is warm and dry.  Psychiatric: He has a normal mood and affect.    ED Course  Procedures (including critical care time)   Labs Reviewed  CBC  DIFFERENTIAL  BASIC METABOLIC PANEL  URINALYSIS, ROUTINE W REFLEX MICROSCOPIC  PROTIME-INR   No results found.   No diagnosis found.    MDM   Date: 03/17/2012  Rate: 82  Rhythm: normal sinus rhythm  QRS Axis: normal  Intervals: normal  ST/T Wave abnormalities: normal  Conduction Disutrbances:none  Narrative Interpretation:   Old EKG Reviewed: unchanged    1600  spoke with neurology and they came and looked at the CT from [facility]. They state that there is no acute stroke on the CD. They also did an examination and the patient was inconsistent with assessment findings. They suggest to repeat MRI since he states his symptoms are worse and if no acute infarct then dc home with follow up.   1700 Report given to [facility] in CDU. We will move patient to CDU awaiting MRI of the brain. Patient is to start taking his Coumadin today. Patient will followup with Dr. Yetta Barre who will follow him for his DVT and INR levels. He will also followup with Dr. Modesto Charon his neurologist tomorrow if MRI is negative.  Patient is using his LUE  for the phone while in the room talking to him explaining the plan.     Labs Reviewed  BASIC METABOLIC PANEL - Abnormal; Notable for the following:    Glucose, Bld 131 (*)    All other components within normal limits  URINALYSIS, ROUTINE W REFLEX MICROSCOPIC - Abnormal; Notable for the following:    Hgb urine dipstick SMALL (*)    Leukocytes, UA SMALL (*)    All other components within normal limits  URINE MICROSCOPIC-ADD ON - Abnormal; Notable for the following:    Casts GRANULAR CAST (*)    All other components within normal limits  CBC  DIFFERENTIAL  PROTIME-INR  POCT I-STAT TROPONIN I       Jethro Bastos, NP 03/17/12 1812

## 2012-03-17 NOTE — Discharge Instructions (Signed)
Mr Sada the MRI today did not show an acute stroke. Start taking her Coumadin as instructed today. Followup with Dr. Yetta Barre in 2 days to have her INR rechecked. Followup with Dr. Modesto Charon her neurologist tomorrow. We repeated the MRI today in the ER and did check your lab values. Your INR today was 1.01.

## 2012-03-17 NOTE — ED Notes (Signed)
Pt had a stroke last week and was hospitalized in [facility], was released Saturday,  Numbness is still going on and his neurologist (neurologist - Modesto Charon with [facility], cardiologist McClahaney (sp?) with [facility]) told him to come back to seen again. Sts he also has a clot in his leg and a hole in his heart. Sts they didn't want him to leave from the hospital as early as he did but the docs allowed him to because he agreed to followup with his docs.

## 2012-03-17 NOTE — ED Provider Notes (Signed)
History     CSN: [id number]  Arrival date & time 03/17/12  1206   First MD Initiated Contact with Patient 03/17/12 1241      Chief Complaint  Patient presents with  . Numbness    (Consider location/radiation/quality/duration/timing/severity/associated sxs/prior treatment) HPI  Past Medical History  Diagnosis Date  . CAD (coronary artery disease)     cath 10/11 with mild LAD, 80% mid circ, PLA 70%. no s/p promus DES circumflex artery 10/13/10  . GERD (gastroesophageal reflux disease)   . HLD (hyperlipidemia)   . HTN (hypertension)   . Tobacco abuse   . CVA (cerebral vascular accident) 3/11  . TIA (transient ischemic attack) 3/11  . Hematuria 8/12  . Sleep apnea     states he does not use CPAP  . Arthritis     Past Surgical History  Procedure Date  . Knee arthroscopy     2 on left and 3 on rt  . Coronary angioplasty with stent placement     Family History  Problem Relation Age of Onset  . Cancer Neg Hx   . Kidney disease Neg Hx   . Diabetes Neg Hx     History  Substance Use Topics  . Smoking status: Current Everyday Smoker -- 1.0 packs/day for 26 years    Types: Cigarettes  . Smokeless tobacco: Never Used   Comment: 1/2 ppd x 30 years  Has already quit smoking  . Alcohol Use: No      Review of Systems  Allergies  Iodine  Home Medications   Current Outpatient Rx  Name Route Sig Dispense Refill  . AMLODIPINE BESYLATE 5 MG PO TABS Oral Take 5 mg by mouth daily.    . ASPIRIN 325 MG PO TBEC Oral Take 325 mg by mouth daily.      . ATORVASTATIN CALCIUM 80 MG PO TABS Oral Take 80 mg by mouth at bedtime.    Marland Kitchen ENOXAPARIN SODIUM 100 MG/ML [place] SOLN Subcutaneous Inject 100 mg into the skin every 12 (twelve) hours. For 7 days. Starting 03/15/12 Ending 03/22/12    . LOSARTAN POTASSIUM 100 MG PO TABS Oral Take 100 mg by mouth daily.      Marland Kitchen NITROGLYCERIN 0.4 MG SL SUBL Sublingual Place 0.4 mg under the tongue every 5 (five) minutes as needed.      Marland Kitchen POTASSIUM CHLORIDE  CRYS ER 20 MEQ PO TBCR Oral Take 20 mEq by mouth daily.     . WARFARIN SODIUM 5 MG PO TABS Oral Take 5 mg by mouth daily.      BP 147/95  Pulse 81  Temp(Src) 98.2 F (36.8 C) (Oral)  Resp 20  SpO2 98%  Physical Exam  ED Course  Procedures (including critical care time)  Labs Reviewed  BASIC METABOLIC PANEL - Abnormal; Notable for the following:    Glucose, Bld 131 (*)    All other components within normal limits  URINALYSIS, ROUTINE W REFLEX MICROSCOPIC - Abnormal; Notable for the following:    Hgb urine dipstick SMALL (*)    Leukocytes, UA SMALL (*)    All other components within normal limits  URINE MICROSCOPIC-ADD ON - Abnormal; Notable for the following:    Casts GRANULAR CAST (*)    All other components within normal limits  CBC  DIFFERENTIAL  PROTIME-INR  POCT I-STAT TROPONIN I   Mr [employer] W/o Cm  03/17/2012  *RADIOLOGY REPORT*  Clinical Data: Numbness.  MRI HEAD WITHOUT CONTRAST  Technique:  Multiplanar, multiecho  pulse sequences of the brain and surrounding structures were obtained according to standard protocol without intravenous contrast.  Comparison: MRI brain 08/17/2011 at [facility].  Findings: The diffusion weighted images demonstrate no evidence for acute or subacute infarction.  A remote infarct of the right internal capsule is stable.  The study is mildly degraded by patient motion.  Remote lacunar infarcts of the caudate head bilaterally are stable.  Moderate periventricular and scattered subcortical T2 and FLAIR hyperintensity are stable bilaterally as well.  Flow is present in the major intracranial arteries.  The globes and orbits are intact.  Mild mucosal thickening is present in the anterior ethmoid air cells and frontal sinuses.  The remaining paranasal sinuses are clear.  The mastoid air cells are clear.  IMPRESSION:  1.  No acute intracranial abnormality or significant interval change. 2.  Remote infarcts of the basal ganglia bilaterally. 3.  Age  advanced atrophy and white matter disease.  This likely reflects the sequelae of chronic microvascular ischemia.  Original Report Authenticated By: Jamesetta Orleans. MATTERN, M.D.   Results for orders placed during the hospital encounter of 03/17/12  CBC      Component Value Range   WBC 6.4  4.0 - 10.5 (K/uL)   RBC 4.60  4.22 - 5.81 (MIL/uL)   Hemoglobin 13.9  13.0 - 17.0 (g/dL)   HCT 62.9  52.8 - 41.3 (%)   MCV 86.5  78.0 - 100.0 (fL)   MCH 30.2  26.0 - 34.0 (pg)   MCHC 34.9  30.0 - 36.0 (g/dL)   RDW 24.4  01.0 - 27.2 (%)   Platelets 310  150 - 400 (K/uL)  DIFFERENTIAL      Component Value Range   Neutrophils Relative 58  43 - 77 (%)   Neutro Abs 3.8  1.7 - 7.7 (K/uL)   Lymphocytes Relative 36  12 - 46 (%)   Lymphs Abs 2.3  0.7 - 4.0 (K/uL)   Monocytes Relative 3  3 - 12 (%)   Monocytes Absolute 0.2  0.1 - 1.0 (K/uL)   Eosinophils Relative 3  0 - 5 (%)   Eosinophils Absolute 0.2  0.0 - 0.7 (K/uL)   Basophils Relative 0  0 - 1 (%)   Basophils Absolute 0.0  0.0 - 0.1 (K/uL)  BASIC METABOLIC PANEL      Component Value Range   Sodium 138  135 - 145 (mEq/L)   Potassium 3.8  3.5 - 5.1 (mEq/L)   Chloride 101  96 - 112 (mEq/L)   CO2 24  19 - 32 (mEq/L)   Glucose, Bld 131 (*) 70 - 99 (mg/dL)   BUN 18  6 - 23 (mg/dL)   Creatinine, Ser 5.36  0.50 - 1.35 (mg/dL)   Calcium 9.4  8.4 - 64.4 (mg/dL)   GFR calc non Af Amer >90  >90 (mL/min)   GFR calc Af Amer >90  >90 (mL/min)  URINALYSIS, ROUTINE W REFLEX MICROSCOPIC      Component Value Range   Color, Urine YELLOW  YELLOW    APPearance CLEAR  CLEAR    Specific Gravity, Urine 1.018  1.005 - 1.030    pH 6.0  5.0 - 8.0    Glucose, UA NEGATIVE  NEGATIVE (mg/dL)   Hgb urine dipstick SMALL (*) NEGATIVE    Bilirubin Urine NEGATIVE  NEGATIVE    Ketones, ur NEGATIVE  NEGATIVE (mg/dL)   Protein, ur NEGATIVE  NEGATIVE (mg/dL)   Urobilinogen, UA 1.0  0.0 -  1.0 (mg/dL)   Nitrite NEGATIVE  NEGATIVE    Leukocytes, UA SMALL (*) NEGATIVE     PROTIME-INR      Component Value Range   Prothrombin Time 13.5  11.6 - 15.2 (seconds)   INR 1.01  0.00 - 1.49   POCT I-STAT TROPONIN I      Component Value Range   Troponin i, poc 0.01  0.00 - 0.08 (ng/mL)   Comment 3           URINE MICROSCOPIC-ADD ON      Component Value Range   Squamous Epithelial / LPF RARE  RARE    WBC, UA 0-2  <3 (WBC/hpf)   RBC / HPF 0-2  <3 (RBC/hpf)   Casts GRANULAR CAST (*) NEGATIVE    Mr [employer] W/o Cm  03/17/2012  *RADIOLOGY REPORT*  Clinical Data: Numbness.  MRI HEAD WITHOUT CONTRAST  Technique:  Multiplanar, multiecho pulse sequences of the brain and surrounding structures were obtained according to standard protocol without intravenous contrast.  Comparison: MRI brain 08/17/2011 at [facility]  Findings: The diffusion weighted images demonstrate no evidence for acute or subacute infarction.  A remote infarct of the right internal capsule is stable.  The study is mildly degraded by patient motion.  Remote lacunar infarcts of the caudate head bilaterally are stable.  Moderate periventricular and scattered subcortical T2 and FLAIR hyperintensity are stable bilaterally as well.  Flow is present in the major intracranial arteries.  The globes and orbits are intact.  Mild mucosal thickening is present in the anterior ethmoid air cells and frontal sinuses.  The remaining paranasal sinuses are clear.  The mastoid air cells are clear.  IMPRESSION:  1.  No acute intracranial abnormality or significant interval change. 2.  Remote infarcts of the basal ganglia bilaterally. 3.  Age advanced atrophy and white matter disease.  This likely reflects the sequelae of chronic microvascular ischemia.  Original Report Authenticated By: Jamesetta Orleans. MATTERN, M.D.    DVT Left sided weakness      MDM  Taken over care of the patient from A. Okey Dupre, NP, please see her note for HPI, ROS and PE.  Patient continues to have multiple complaints including left sided  weakness, right LE DVT and "a hole in my heart"  MRI does not show an acute event but notes the old basal ganglia CVA.  The patient is currently on lovenox and was supposed to start coumadin for the DVT but he reports that he does not know when to start it.  I have instructed the patient to start this tonight and to follow up with either his PCP or cardiologist for INR checks.  The patient is concerned that the clot may break off and cause damage.  I have educated the patient as best I can.  He denies chest pain or shortness of breath so I do not suspect a PE, patient informed on when to return to the ER.  He has a neurologist and will follow up with him for the left sided weakness.        Izola Price [place], [place] 03/17/12 [phone]

## 2012-03-17 NOTE — ED Notes (Signed)
Pt notified that we needed to obtain a urine sample

## 2012-03-17 NOTE — Telephone Encounter (Signed)
Called and spoke with the patient. Information given as directed by Dr. Modesto Charon. Advised patient should go to the hospital as advised by the treating facility in [place]. I told him he should take all paper work and imaging discs and proceed to [facility] ER and to let them know what happened. The patient states he can't drive as the hospital advised but he can get a ride. I told him that I would let Dr. Modesto Charon know that he was going. The patient agreed with this plan. **Dr. Modesto Charon.....FYI.

## 2012-03-18 ENCOUNTER — Telehealth: Payer: Self-pay | Admitting: Cardiovascular Disease

## 2012-03-18 NOTE — Telephone Encounter (Signed)
New Problem:    Patient called in wanting to be scheduled to see Dr. Clifton James within the next two to three days per his discharge instructions.  Please call back.

## 2012-03-18 NOTE — Telephone Encounter (Signed)
Spoke with pt. He was seen in ED on March 17, 2012. Appt made for him to see Dr. Clifton James on March 19, 2012 at 2:00

## 2012-03-18 NOTE — Telephone Encounter (Signed)
Tony Davis, can we check on him and make sure he is ok? Thanks, Brantley Persons

## 2012-03-18 NOTE — ED Provider Notes (Signed)
Medical screening examination/treatment/procedure(s) were performed by non-physician practitioner and as supervising physician I was immediately available for consultation/collaboration.   Leigh-Ann Josemanuel Eakins, MD 03/18/12 0722 

## 2012-03-18 NOTE — Telephone Encounter (Signed)
Spoke with pt. He will come in to see Dr. Clifton James tomorrow at 2:00

## 2012-03-18 NOTE — ED Provider Notes (Signed)
Medical screening examination/treatment/procedure(s) were conducted as a shared visit with non-physician practitioner(s) and myself.  I personally evaluated the patient during the encounter  LLE weakness x several days. Recent w/u for same OSH. Pt called his neurologist who referred him to ED for further w/u and evaluation. +LLE and LUE weakness 4+/5, ?effort.  Neurology has evaluated the pt in the ED and they do not feel that his symptoms are c/w acute stroke. MRI head pending. Patient signed out to CDU/Dr. Rubin Payor supervising  Forbes Cellar, MD 03/18/12 [phone]

## 2012-03-19 ENCOUNTER — Inpatient Hospital Stay (HOSPITAL_COMMUNITY)
Admission: AD | Admit: 2012-03-19 | Discharge: 2012-03-24 | DRG: 130 | Disposition: A | Discharge status: Home / Self Care | Payer: BC Managed Care – PPO | Source: Ambulatory Visit | Attending: Cardiovascular Disease | Admitting: Cardiovascular Disease

## 2012-03-19 ENCOUNTER — Encounter: Payer: Self-pay | Admitting: Cardiovascular Disease

## 2012-03-19 ENCOUNTER — Ambulatory Visit: Payer: BC Managed Care – PPO | Admitting: Internal Medicine

## 2012-03-19 ENCOUNTER — Ambulatory Visit (INDEPENDENT_AMBULATORY_CARE_PROVIDER_SITE_OTHER): Payer: BC Managed Care – PPO | Admitting: Cardiovascular Disease

## 2012-03-19 ENCOUNTER — Encounter (HOSPITAL_COMMUNITY): Payer: Self-pay | Admitting: General Practice

## 2012-03-19 VITALS — BP 112/65 | HR 92 | Ht 71.0 in | Wt 233.0 lb

## 2012-03-19 DIAGNOSIS — Z9119 Patient's noncompliance with other medical treatment and regimen: Secondary | ICD-10-CM

## 2012-03-19 DIAGNOSIS — I82409 Acute embolism and thrombosis of unspecified deep veins of unspecified lower extremity: Secondary | ICD-10-CM

## 2012-03-19 DIAGNOSIS — F17200 Nicotine dependence, unspecified, uncomplicated: Secondary | ICD-10-CM

## 2012-03-19 DIAGNOSIS — Z7982 Long term (current) use of aspirin: Secondary | ICD-10-CM

## 2012-03-19 DIAGNOSIS — M129 Arthropathy, unspecified: Secondary | ICD-10-CM | POA: Diagnosis present

## 2012-03-19 DIAGNOSIS — Z888 Allergy status to other drugs, medicaments and biological substances status: Secondary | ICD-10-CM

## 2012-03-19 DIAGNOSIS — E669 Obesity, unspecified: Secondary | ICD-10-CM | POA: Diagnosis present

## 2012-03-19 DIAGNOSIS — E876 Hypokalemia: Secondary | ICD-10-CM

## 2012-03-19 DIAGNOSIS — Z7901 Long term (current) use of anticoagulants: Secondary | ICD-10-CM

## 2012-03-19 DIAGNOSIS — R29898 Other symptoms and signs involving the musculoskeletal system: Secondary | ICD-10-CM | POA: Diagnosis present

## 2012-03-19 DIAGNOSIS — Z91199 Patient's noncompliance with other medical treatment and regimen due to unspecified reason: Secondary | ICD-10-CM

## 2012-03-19 DIAGNOSIS — Z720 Tobacco use: Secondary | ICD-10-CM

## 2012-03-19 DIAGNOSIS — R531 Weakness: Secondary | ICD-10-CM

## 2012-03-19 DIAGNOSIS — I2510 Atherosclerotic heart disease of native coronary artery without angina pectoris: Secondary | ICD-10-CM

## 2012-03-19 DIAGNOSIS — I10 Essential (primary) hypertension: Secondary | ICD-10-CM | POA: Diagnosis present

## 2012-03-19 DIAGNOSIS — Z6832 Body mass index (BMI) 32.0-32.9, adult: Secondary | ICD-10-CM

## 2012-03-19 DIAGNOSIS — Z8673 Personal history of transient ischemic attack (TIA), and cerebral infarction without residual deficits: Secondary | ICD-10-CM

## 2012-03-19 DIAGNOSIS — Q211 Atrial septal defect: Secondary | ICD-10-CM

## 2012-03-19 DIAGNOSIS — Z79899 Other long term (current) drug therapy: Secondary | ICD-10-CM

## 2012-03-19 DIAGNOSIS — Z0289 Encounter for other administrative examinations: Secondary | ICD-10-CM

## 2012-03-19 DIAGNOSIS — E785 Hyperlipidemia, unspecified: Secondary | ICD-10-CM | POA: Insufficient documentation

## 2012-03-19 DIAGNOSIS — Q2111 Secundum atrial septal defect: Secondary | ICD-10-CM

## 2012-03-19 DIAGNOSIS — Z9861 Coronary angioplasty status: Secondary | ICD-10-CM

## 2012-03-19 LAB — COMPREHENSIVE METABOLIC PANEL
ALT: 96 U/L — ABNORMAL HIGH (ref 0–53)
Alkaline Phosphatase: 103 U/L (ref 39–117)
CO2: 25 mEq/L (ref 19–32)
GFR calc Af Amer: 84 mL/min — ABNORMAL LOW (ref >90)
GFR calc non Af Amer: 73 mL/min — ABNORMAL LOW (ref >90)
Glucose, Bld: 104 mg/dL — ABNORMAL HIGH (ref 70–99)
Potassium: 3.5 mEq/L (ref 3.5–5.1)
Sodium: 145 mEq/L (ref 135–145)

## 2012-03-19 LAB — CBC
Platelets: 336 10*3/uL (ref 150–400)
RBC: 4.75 MIL/uL (ref 4.22–5.81)
WBC: 8.4 10*3/uL (ref 4.0–10.5)

## 2012-03-19 LAB — PROTIME-INR
INR: 1.04 (ref 0.00–1.49)
Prothrombin Time: 13.8 seconds (ref 11.6–15.2)

## 2012-03-19 LAB — DIFFERENTIAL
Lymphocytes Relative: 30 % (ref 12–46)
Lymphs Abs: 2.5 10*3/uL (ref 0.7–4.0)
Neutrophils Relative %: 62 % (ref 43–77)

## 2012-03-19 MED ORDER — SODIUM CHLORIDE 0.9 % IV SOLN
250.0000 mL | INTRAVENOUS | Status: DC | PRN
  Administered 2012-03-20: 250 mL via INTRAVENOUS

## 2012-03-19 MED ORDER — WARFARIN SODIUM 10 MG PO TABS
10.0000 mg | ORAL_TABLET | Freq: Once | ORAL | Status: AC
  Administered 2012-03-19: 10 mg via ORAL
  Filled 2012-03-19 (×2): qty 1

## 2012-03-19 MED ORDER — AMLODIPINE BESYLATE 5 MG PO TABS
5.0000 mg | ORAL_TABLET | Freq: Every day | ORAL | Status: DC
  Administered 2012-03-20: 5 mg via ORAL
  Filled 2012-03-19 (×3): qty 1

## 2012-03-19 MED ORDER — ACETAMINOPHEN 325 MG PO TABS
650.0000 mg | ORAL_TABLET | ORAL | Status: DC | PRN
  Administered 2012-03-20 – 2012-03-22 (×3): 650 mg via ORAL
  Filled 2012-03-19 (×3): qty 2

## 2012-03-19 MED ORDER — SODIUM CHLORIDE 0.9 % IJ SOLN: 3.0000 mL | Freq: Two times a day (BID) | INTRAMUSCULAR | Status: DC

## 2012-03-19 MED ORDER — WARFARIN - PHARMACIST DOSING INPATIENT
Freq: Every day | Status: DC
  Administered 2012-03-19: 18:00:00

## 2012-03-19 MED ORDER — SODIUM CHLORIDE 0.9 % IJ SOLN: 3.0000 mL | INTRAMUSCULAR | Status: DC | PRN

## 2012-03-19 MED ORDER — COUMADIN BOOK
Freq: Once | Status: AC
  Administered 2012-03-19: 17:00:00
  Filled 2012-03-19: qty 1

## 2012-03-19 MED ORDER — ONDANSETRON HCL 4 MG/2ML IJ SOLN: 4.0000 mg | Freq: Four times a day (QID) | INTRAMUSCULAR | Status: DC | PRN

## 2012-03-19 MED ORDER — WARFARIN VIDEO
Freq: Once | Status: AC
  Administered 2012-03-19: 17:00:00

## 2012-03-19 MED ORDER — ZOLPIDEM TARTRATE 5 MG PO TABS: 10.0000 mg | ORAL_TABLET | Freq: Every evening | ORAL | Status: DC | PRN

## 2012-03-19 MED ORDER — LOSARTAN POTASSIUM 50 MG PO TABS
100.0000 mg | ORAL_TABLET | Freq: Every day | ORAL | Status: DC
  Administered 2012-03-20 – 2012-03-24 (×5): 100 mg via ORAL
  Filled 2012-03-19 (×6): qty 2

## 2012-03-19 MED ORDER — ATORVASTATIN CALCIUM 80 MG PO TABS
80.0000 mg | ORAL_TABLET | Freq: Every day | ORAL | Status: DC
  Administered 2012-03-19 – 2012-03-23 (×5): 80 mg via ORAL
  Filled 2012-03-19 (×7): qty 1

## 2012-03-19 MED ORDER — HEPARIN (PORCINE) IN NACL 100-0.45 UNIT/ML-% IJ SOLN
1600.0000 [IU]/h | INTRAMUSCULAR | Status: DC
  Administered 2012-03-19: 1600 [IU]/h via INTRAVENOUS
  Filled 2012-03-19 (×2): qty 250

## 2012-03-19 MED ORDER — NITROGLYCERIN 0.4 MG SL SUBL: 0.4000 mg | SUBLINGUAL_TABLET | SUBLINGUAL | Status: DC | PRN

## 2012-03-19 MED ORDER — HEPARIN BOLUS VIA INFUSION
4000.0000 [IU] | Freq: Once | INTRAVENOUS | Status: AC
  Administered 2012-03-19: 4000 [IU] via INTRAVENOUS
  Filled 2012-03-19: qty 4000

## 2012-03-19 MED ORDER — ASPIRIN EC 81 MG PO TBEC
81.0000 mg | DELAYED_RELEASE_TABLET | Freq: Every day | ORAL | Status: DC
  Administered 2012-03-20 – 2012-03-24 (×5): 81 mg via ORAL
  Filled 2012-03-19 (×5): qty 1

## 2012-03-19 NOTE — Assessment & Plan Note (Signed)
Will admit to [facility] and start Heparin gtt. Continue coumadin. He may need IVC filter as he has DVT and a PFO so his CVA risk is high. He refuses to take Lovenox injections at home so admission is required.

## 2012-03-19 NOTE — Assessment & Plan Note (Signed)
Will ask Dr. Excell Seltzer to evaluate for possible closure given CVA/TIA and DVT.

## 2012-03-19 NOTE — H&P (Signed)
History of Present Illness: 51 yo AAM with history of CAD, HTN, hyperlipidemia, impaired fasting glucose and obesity who is here today for cardiac follow up. I saw him in February 2011 for complaints of chest pain. I arranged a stress test, an echo and carotid dopplers at the first visit. He missed his follow up appt. The carotid dopplers were normal. He came in for his stress test and complained of neurological changes. He was seen by Dr. Riley Kill and the stress test was cancelled. He was sent over to Dr. Stones office and had MRI/MRA of the head and neck. MRA showed no occlusive disease. MRI showed non-specific white matter changes but no clear infarct, possibly related to hypertension. His echo shows septal and inferobasal hypokinesis with mild concentric hypertrophy, EF 45-50%. He was seen here in October 2011 and was still having chest pains. I arranged a left heart cath in the outpatient lab. He was found to have a stenosis in the mid Circumflex. He told us about dark stools the day before the cath. The intervention was delayed pending a GI workup. He has had no further issues with dark stools at follow up 2 weeks ago. He was brought back in 10/13/10 for planned PCI of the Circumflex. A Promus drug eluting stent was placed without any complications. Repeat echo August 2012 with PFO with positive bubble study. I arranged for him to see Dr. Excell Seltzer for PFO closure but he skipped his appointment.  He is added on today for evaluation of left arm and leg weakness. He was in [place] last week and had acute onset of left arm and leg weakness. He was admitted. CT head without acute stroke. Venous dopplers with chronic DVT right femoral vein. Carotids with <50% RICA stenosis, LICA ok. Echo with PFO. Echo 03/14/12 in [place] with normal LV size and function with PFO with positive bubble study. He was started on coumadin and Lovenox and left AMA from the hospital there. He was seen in [facility] ED on 03/17/12 and  discharged home. He continues to have left sided weakness. He has not been using his Lovenox as prescribed and just started coumadin yesterday.  No SOB, chest pain.    Primary Care Physician: [name]    Past Medical History   Diagnosis  Date   .  CAD (coronary artery disease)      cath 10/11 with mild LAD, 80% mid circ, PLA 70%. no s/p promus DES circumflex artery 10/13/10   .  GERD (gastroesophageal reflux disease)    .  HLD (hyperlipidemia)    .  HTN (hypertension)    .  Tobacco abuse    .  CVA (cerebral vascular accident)  3/11   .  TIA (transient ischemic attack)  3/11   .  Hematuria  8/12   .  Sleep apnea      states he does not use CPAP   .  Arthritis    .  PFO (patent foramen ovale)     Past Surgical History   Procedure  Date   .  Knee arthroscopy      2 on left and 3 on rt   .  Coronary angioplasty with stent placement     Current Outpatient Prescriptions   Medication  Sig  Dispense  Refill   .  amLODipine (NORVASC) 5 MG tablet  Take 5 mg by mouth daily.     Marland Kitchen  atorvastatin (LIPITOR) 80 MG tablet  Take 80 mg by  mouth at bedtime.     .  enoxaparin (LOVENOX) 100 MG/ML injection  Inject 100 mg into the skin every 12 (twelve) hours. For 7 days. Starting 03/15/12 Ending 03/22/12     .  losartan (COZAAR) 100 MG tablet  Take 100 mg by mouth daily.     .  nitroGLYCERIN (NITROSTAT) 0.4 MG SL tablet  Place 0.4 mg under the tongue every 5 (five) minutes as needed.     .  potassium chloride SA (K-DUR,KLOR-CON) 20 MEQ tablet  Take 20 mEq by mouth daily.     Marland Kitchen  warfarin (COUMADIN) 5 MG tablet  Take 5 mg by mouth daily.      Allergies   Allergen  Reactions   .  Iodine      REACTION: anaphylaxis    History    Social History   .  Marital Status:  Married     Spouse Name:  N/A     Number of Children:  N/A   .  Years of Education:  N/A    Occupational History   .  Not on file.    Social History Main Topics   .  Smoking status:  Current Everyday Smoker -- 1.0 packs/day  for 26 years     Types:  Cigarettes   .  Smokeless tobacco:  Never Used     Comment: 1/2 ppd x 30 years Has already quit smoking    .  Alcohol Use:  No   .  Drug Use:  No   .  Sexually Active:  Not Currently    Other Topics  Concern   .  Not on file    Social History Narrative    Married, 5 kids; lives in [place]. 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    Family History   Problem  Relation  Age of Onset   .  Cancer  Neg Hx    .  Kidney disease  Neg Hx    .  Diabetes  Neg Hx     Review of Systems: As stated in the HPI and otherwise negative.  BP 112/65  Pulse 92  Ht 5\' 11"  (1.803 m)  Wt 233 lb (105.688 kg)  BMI 32.50 kg/m2  Physical Examination:  General: Well developed, well nourished, NAD  HEENT: OP clear, mucus membranes moist  SKIN: warm, dry. No rashes.  Neuro: Left sided weakness  Musculoskeletal: Muscle strength 5/5 all ext  Psychiatric: Mood and affect normal  Neck: No JVD, no carotid bruits, no thyromegaly, no lymphadenopathy.  Lungs:Clear bilaterally, no wheezes, rhonci, crackles  Cardiovascular: Regular rate and rhythm. No murmurs, gallops or rubs.  Abdomen:Soft. Bowel sounds present. Non-tender.  Extremities: No lower extremity edema. Pulses are 2 + in the bilateral DP/PT.   Assessment and Plan:  PATENT FORAMEN OVALE Verne Carrow, MD 03/19/2012 2:53 PM Signed  Will ask Dr. Excell Seltzer to evaluate for possible closure given CVA/TIA and DVT. He will need long term anti-coagulation  CAD, NATIVE VESSEL - ,, MD 03/19/2012 2:53 PM Signed  Stable.   TOBACCO ABUSE Verne Carrow, MD 03/19/2012 2:54 PM Signed  Pt quit smoking.   DVT (deep venous thrombosis) Verne Carrow, MD 03/19/2012 2:55 PM Signed  Will admit to [facility] and start Heparin gtt. Continue coumadin. He may need IVC filter as he has DVT and a PFO so his CVA risk is high. He refuses to take Lovenox injections at home so admission is required.

## 2012-03-19 NOTE — Assessment & Plan Note (Signed)
Pt quit smoking  

## 2012-03-19 NOTE — Assessment & Plan Note (Signed)
Stable

## 2012-03-19 NOTE — Progress Notes (Signed)
ANTICOAGULATION CONSULT NOTE - Initial Consult  Pharmacy Consult for heparin,coumadin Indication: DVT  Allergies  Allergen Reactions  . Iodine     REACTION: anaphylaxis    Patient Measurements:   Total Body Weight 109 kg  Vital Signs: BP: 112/65 mmHg (04/10 1423) Pulse Rate: 92  (04/10 1423)  Labs:  Basename 03/17/12 1247  HGB 13.9  HCT 39.8  PLT 310  APTT --  LABPROT 13.5  INR 1.01  HEPARINUNFRC --  CREATININE 0.92  CKTOTAL --  CKMB --  TROPONINI --   The CrCl is unknown because both a height and weight (above a minimum accepted value) are required for this calculation.  Medical History: Past Medical History  Diagnosis Date  . CAD (coronary artery disease)     cath 10/11 with mild LAD, 80% mid circ, PLA 70%. no s/p promus DES circumflex artery 10/13/10  . GERD (gastroesophageal reflux disease)   . HLD (hyperlipidemia)   . HTN (hypertension)   . Tobacco abuse   . CVA (cerebral vascular accident) 3/11  . TIA (transient ischemic attack) 3/11  . Hematuria 8/12  . Sleep apnea     states he does not use CPAP  . Arthritis   . PFO (patent foramen ovale)     Medications:  Prescriptions prior to admission  Medication Sig Dispense Refill  . amLODipine (NORVASC) 5 MG tablet Take 5 mg by mouth daily.      Marland Kitchen atorvastatin (LIPITOR) 80 MG tablet Take 80 mg by mouth at bedtime.      . enoxaparin (LOVENOX) 100 MG/ML injection Inject 100 mg into the skin every 12 (twelve) hours. For 7 days. Starting 03/15/12 Ending 03/22/12      . losartan (COZAAR) 100 MG tablet Take 100 mg by mouth daily.        . nitroGLYCERIN (NITROSTAT) 0.4 MG SL tablet Place 0.4 mg under the tongue every 5 (five) minutes as needed.        . potassium chloride SA (K-DUR,KLOR-CON) 20 MEQ tablet Take 20 mEq by mouth daily.       Marland Kitchen warfarin (COUMADIN) 5 MG tablet Take 5 mg by mouth daily.        Assessment: 51 yo man found to have DVT r femoral vein.  He has not been using his lovenox as prescribed and  just started coumadin yesterday. Goal of Therapy:  INR 2-3, heparin level 0.3-0.7 units/ml   Plan:  Heparin bolus 4000 units and drip at 1600 units/hr. Check heparin level and CBC 6 hours after bolus and daily while on heparin. Coumadin 10 mg po today. Check daily protime/INR.  Tony Davis 03/19/2012,4:08 PM

## 2012-03-19 NOTE — Progress Notes (Signed)
History of Present Illness: 51 yo AAM with history of CAD, HTN, hyperlipidemia, impaired fasting glucose and obesity who is here today for cardiac follow up. I saw him in February 2011 for complaints of chest pain. I arranged a stress test, an echo and carotid dopplers at the first visit. He missed his follow up appt. The carotid dopplers were normal. He came in for his stress test and complained of neurological changes. He was seen by Dr. Riley Kill and the stress test was cancelled. He was sent over to Dr. Stones office and had MRI/MRA of the head and neck. MRA showed no occlusive disease. MRI showed non-specific white matter changes but no clear infarct, possibly related to hypertension. His echo shows septal and inferobasal hypokinesis with mild concentric hypertrophy, EF 45-50%.  He was seen here in October 2011 and was still having chest pains. I arranged a left heart cath in the outpatient lab. He was found to have a stenosis in the mid Circumflex. He told us about dark stools the day before the cath. The intervention was delayed pending a GI workup. He has had no further issues with dark stools at follow up 2 weeks ago. He was brought back in 10/13/10 for planned PCI of the Circumflex. A Promus drug eluting stent was placed without any complications. Repeat echo August 2012 with PFO with positive bubble study. I arranged for him to see Dr. Excell Seltzer for PFO closure but he skipped his appointment.  He is added on today for evaluation of left arm and leg weakness. He was in [place] last week and had acute onset of left arm and leg weakness. He was admitted. CT head without acute stroke. Venous dopplers with chronic DVT right femoral vein. Carotids with <50% RICA stenosis, LICA ok. Echo with PFO. Echo 03/14/12 in [place] with normal LV size and function with PFO with positive bubble study. He was started on coumadin and Lovenox and left AMA from the hospital there. He was seen in [facility] ED on 03/17/12 and  discharged home. He continues to have left sided weakness. He has not been using his Lovenox as prescribed and just started coumadin yesterday.   No SOB, chest pain.    Primary Care Physician: [name]  Past Medical History  Diagnosis Date  . CAD (coronary artery disease)     cath 10/11 with mild LAD, 80% mid circ, PLA 70%. no s/p promus DES circumflex artery 10/13/10  . GERD (gastroesophageal reflux disease)   . HLD (hyperlipidemia)   . HTN (hypertension)   . Tobacco abuse   . CVA (cerebral vascular accident) 3/11  . TIA (transient ischemic attack) 3/11  . Hematuria 8/12  . Sleep apnea     states he does not use CPAP  . Arthritis   . PFO (patent foramen ovale)     Past Surgical History  Procedure Date  . Knee arthroscopy     2 on left and 3 on rt  . Coronary angioplasty with stent placement     Current Outpatient Prescriptions  Medication Sig Dispense Refill  . amLODipine (NORVASC) 5 MG tablet Take 5 mg by mouth daily.      Marland Kitchen atorvastatin (LIPITOR) 80 MG tablet Take 80 mg by mouth at bedtime.      . enoxaparin (LOVENOX) 100 MG/ML injection Inject 100 mg into the skin every 12 (twelve) hours. For 7 days. Starting 03/15/12 Ending 03/22/12      . losartan (COZAAR) 100 MG tablet Take 100 mg  by mouth daily.        . nitroGLYCERIN (NITROSTAT) 0.4 MG SL tablet Place 0.4 mg under the tongue every 5 (five) minutes as needed.        . potassium chloride SA (K-DUR,KLOR-CON) 20 MEQ tablet Take 20 mEq by mouth daily.       Marland Kitchen warfarin (COUMADIN) 5 MG tablet Take 5 mg by mouth daily.        Allergies  Allergen Reactions  . Iodine     REACTION: anaphylaxis    History   Social History  . Marital Status: Married    Spouse Name: N/A    Number of Children: N/A  . Years of Education: N/A   Occupational History  . Not on file.   Social History Main Topics  . Smoking status: Current Everyday Smoker -- 1.0 packs/day for 26 years    Types: Cigarettes  . Smokeless tobacco: Never  Used   Comment: 1/2 ppd x 30 years  Has already quit smoking  . Alcohol Use: No  . Drug Use: No  . Sexually Active: Not Currently   Other Topics Concern  . Not on file   Social History Narrative   Married, 5 kids; lives in [place]. 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     Family History  Problem Relation Age of Onset  . Cancer Neg Hx   . Kidney disease Neg Hx   . Diabetes Neg Hx     Review of Systems:  As stated in the HPI and otherwise negative.   BP 112/65  Pulse 92  Ht 5\' 11"  (1.803 m)  Wt 233 lb (105.688 kg)  BMI 32.50 kg/m2  Physical Examination: General: Well developed, well nourished, NAD HEENT: OP clear, mucus membranes moist SKIN: warm, dry. No rashes. Neuro: Left sided weakness Musculoskeletal: Muscle strength 5/5 all ext Psychiatric: Mood and affect normal Neck: No JVD, no carotid bruits, no thyromegaly, no lymphadenopathy. Lungs:Clear bilaterally, no wheezes, rhonci, crackles Cardiovascular: Regular rate and rhythm. No murmurs, gallops or rubs. Abdomen:Soft. Bowel sounds present. Non-tender.  Extremities: No lower extremity edema. Pulses are 2 + in the bilateral DP/PT.

## 2012-03-20 ENCOUNTER — Ambulatory Visit: Payer: BC Managed Care – PPO | Admitting: Internal Medicine

## 2012-03-20 DIAGNOSIS — I2510 Atherosclerotic heart disease of native coronary artery without angina pectoris: Secondary | ICD-10-CM

## 2012-03-20 LAB — BASIC METABOLIC PANEL
BUN: 13 mg/dL (ref 6–23)
Calcium: 9.2 mg/dL (ref 8.4–10.5)
GFR calc Af Amer: >90 mL/min (ref >90)
GFR calc non Af Amer: >90 mL/min (ref >90)
Potassium: 3.5 mEq/L (ref 3.5–5.1)
Sodium: 140 mEq/L (ref 135–145)

## 2012-03-20 LAB — CBC
Hemoglobin: 14.2 g/dL (ref 13.0–17.0)
MCHC: 34.4 g/dL (ref 30.0–36.0)
Platelets: 339 10*3/uL (ref 150–400)

## 2012-03-20 LAB — PROTIME-INR
INR: 1.11 (ref 0.00–1.49)
Prothrombin Time: 14.5 seconds (ref 11.6–15.2)

## 2012-03-20 MED ORDER — WARFARIN SODIUM 10 MG PO TABS
10.0000 mg | ORAL_TABLET | Freq: Once | ORAL | Status: AC
  Administered 2012-03-20: 10 mg via ORAL
  Filled 2012-03-20: qty 1

## 2012-03-20 MED ORDER — HEPARIN (PORCINE) IN NACL 100-0.45 UNIT/ML-% IJ SOLN
1250.0000 [IU]/h | INTRAMUSCULAR | Status: DC
  Administered 2012-03-20: 1350 [IU]/h via INTRAVENOUS
  Administered 2012-03-20: 1250 [IU]/h via INTRAVENOUS
  Administered 2012-03-20: 1350 [IU]/h via INTRAVENOUS
  Administered 2012-03-21 – 2012-03-23 (×4): 1250 [IU]/h via INTRAVENOUS
  Filled 2012-03-20 (×7): qty 250

## 2012-03-20 NOTE — Plan of Care (Deleted)
Problem: Phase II Progression Outcomes Goal: Therapeutic drug levels for anticoagulation Outcome: Progressing INR 1.11 today

## 2012-03-20 NOTE — Plan of Care (Signed)
Problem: Phase II Progression Outcomes Goal: Therapeutic drug levels for anticoagulation Outcome: Progressing INR 1.11 today; on heparin gtts and coumadin

## 2012-03-20 NOTE — Progress Notes (Signed)
ANTICOAGULATION CONSULT NOTE - Follow Up Consult  Pharmacy Consult for Heparin and Coumadin Indication: RLE DVT, hx TIA/CVA, PFO  Allergies  Allergen Reactions  . Iodine     REACTION: anaphylaxis    Patient Measurements: Height: 5\' 11"  (180.3 cm) Weight: 236 lb (107.049 kg) IBW/kg (Calculated) : 75.3  Heparin Dosing Weight: 98 kg  Vital Signs: Temp: 98.6 F (37 C) (04/11 0500) Temp src: Oral (04/11 0500) BP: 141/81 mmHg (04/11 1002) Pulse Rate: 73  (04/11 0500)  Labs:  Basename 03/20/12 0858 03/19/12 2233 03/19/12 1605 03/17/12 1247  HGB 14.2 -- 14.3 --  HCT 41.3 -- 41.7 39.8  PLT 339 -- 336 310  APTT -- -- 35 --  LABPROT 14.5 -- 13.8 13.5  INR 1.11 -- 1.04 1.01  HEPARINUNFRC 0.74* 1.12* -- --  CREATININE 0.88 -- 1.16 0.92  CKTOTAL -- -- -- --  CKMB -- -- -- --  TROPONINI -- -- -- --   Estimated Creatinine Clearance: 127.8 ml/min (by C-G formula based on Cr of 0.88).  Assessment:   Heparin/Coumadin overlap day #2.   Heparin level is just above goal on 1350 units/hr.   INR 1.11 after Coumadin 5 mg on 4/9 prior to admission, then 10 mg 4/10 pm in the hospital. Discussed Coumadin therapy, monitoring, precautions and potential drug-drug and drug-food interactions. Pt verbalized understanding.  He did relate occasional significant nosebleeds, which he feels were related to high BP, if he hadn't taken his BP meds. No recent nosebleeds. Potential PFO closure in the future.  Goal of Therapy:  INR 2-3 Heparin level 0.3-0.7   Plan:    Will decrease heparin drip to 1250 units/hr.   Coumadin 10 mg PO again today.   Next heparin level, CBC and PT/INR in am.  Dennie Fetters, RPh Pager: [phone] 03/20/2012,11:07 AM

## 2012-03-20 NOTE — Progress Notes (Signed)
    SUBJECTIVE: No complaints this am. No chest pain or SOB. Still with weakness left arm and leg.   BP 120/79  Pulse 73  Temp(Src) 98.6 F (37 C) (Oral)  Resp 18  Wt 236 lb (107.049 kg)  SpO2 100%  Intake/Output Summary (Last 24 hours) at 03/20/12 1610 Last data filed at 03/20/12 0201  Gross per 24 hour  Intake    360 ml  Output    700 ml  Net   -340 ml    PHYSICAL EXAM General: Well developed, well nourished, in no acute distress. Alert and oriented x 3.  Psych:  Good affect, responds appropriately Neck: No JVD. No masses noted.  Lungs: Clear bilaterally with no wheezes or rhonci noted.  Heart: RRR with no murmurs noted. Abdomen: Bowel sounds are present. Soft, non-tender.  Extremities: No lower extremity edema.   LABS: Basic Metabolic Panel:  Basename 03/19/12 1605 03/17/12 1247  NA 145 138  K 3.5 3.8  CL 107 101  CO2 25 24  GLUCOSE 104* 131*  BUN 16 18  CREATININE 1.16 0.92  CALCIUM 10.2 9.4  MG 1.8 --  PHOS -- --   CBC:  Basename 03/19/12 1605 03/17/12 1247  WBC 8.4 6.4  NEUTROABS 5.2 3.8  HGB 14.3 13.9  HCT 41.7 39.8  MCV 87.8 86.5  PLT 336 310    Current Meds:    . amLODipine  5 mg Oral Daily  . aspirin EC  81 mg Oral Daily  . atorvastatin  80 mg Oral q1800  . coumadin book   Does not apply Once  . heparin  4,000 Units Intravenous Once  . losartan  100 mg Oral Daily  . sodium chloride  3 mL Intravenous Q12H  . warfarin  10 mg Oral ONCE-1800  . warfarin   Does not apply Once  . Warfarin - Pharmacist Dosing Inpatient   Does not apply q1800     ASSESSMENT AND PLAN: 51 yo AAM with history of CAD, TIA/CVA, PFO with recent admission to a hospital in [place] with neurological changes c/w CVA and found to have a right lower extremity DVT. He left that hospital aganst medical advice and came to my clinic yesterday. He was not compliant with his Lovenox injections nor his coumadin therapy. He refused to continue to use Lovenox because of abdominal  wall pain. He was admitted for IV heparin as a bridge while coumadin is becoming therapeutic. Of note, pt had been referred in August of 2012 for possible PFO closure but he cancelled that appointment and did not reschedule.   1. DVT:  On heparin drip as a bridge while coumadin becoming therapeutic. He will need to be an inpatient until his INR is greater than 2.0.  2. PFO: He may need closure but for now will anti-coagulate and will review case with Dr. Excell Seltzer to plan potential closure at a later time.   3. TIA/CVA: I have reviewed the CT head and MRI head from the outside hospital and there is evidence of old stroke but no acute CVA. No evidence of hemorrhage. He is followed as an outpatient by Dr. Modesto Charon with [facility]. He continues to have weakness on the left side but no changes. He will need lifelong coumadin.   Tony Davis  4/11/20136:42 AM

## 2012-03-20 NOTE — Progress Notes (Signed)
ANTICOAGULATION CONSULT NOTE - Initial Consult  Pharmacy Consult for Heparin Indication: DVT  Allergies  Allergen Reactions  . Iodine     REACTION: anaphylaxis    Patient Measurements:   Total Body Weight 109 kg  Vital Signs: Temp: 98.5 F (36.9 C) (04/10 2100) Temp src: Oral (04/10 1618) BP: 126/80 mmHg (04/10 2100) Pulse Rate: 80  (04/10 2100)  Labs:  Basename 03/19/12 2233 03/19/12 1605 03/17/12 1247  HGB -- 14.3 13.9  HCT -- 41.7 39.8  PLT -- 336 310  APTT -- 35 --  LABPROT -- 13.8 13.5  INR -- 1.04 1.01  HEPARINUNFRC 1.12* -- --  CREATININE -- 1.16 0.92  CKTOTAL -- -- --  CKMB -- -- --  TROPONINI -- -- --   The CrCl is unknown because both a height and weight (above a minimum accepted value) are required for this calculation.  Assessment: 51 yo male with DVT/PFO for anticoagulation  Goal of Therapy:  INR 2-3,  Heparin level 0.3-0.7 units/ml   Plan:  Hold heparin x 1 hour, decrease 1350 units/hr Follow-up am labs.  Eddie Candle 03/20/2012,12:03 AM

## 2012-03-21 ENCOUNTER — Encounter (HOSPITAL_COMMUNITY): Payer: Self-pay | Admitting: *Deleted

## 2012-03-21 ENCOUNTER — Encounter (HOSPITAL_COMMUNITY)
Admission: AD | Disposition: A | Discharge status: Home / Self Care | Payer: Self-pay | Source: Ambulatory Visit | Attending: Cardiovascular Disease

## 2012-03-21 DIAGNOSIS — Q2111 Secundum atrial septal defect: Secondary | ICD-10-CM

## 2012-03-21 DIAGNOSIS — Q211 Atrial septal defect: Secondary | ICD-10-CM

## 2012-03-21 HISTORY — PX: TEE WITHOUT CARDIOVERSION: SHX5443

## 2012-03-21 LAB — BASIC METABOLIC PANEL
BUN: 19 mg/dL (ref 6–23)
CO2: 24 mEq/L (ref 19–32)
Calcium: 9.4 mg/dL (ref 8.4–10.5)
Creatinine, Ser: 0.98 mg/dL (ref 0.50–1.35)
GFR calc non Af Amer: >90 mL/min (ref >90)
Glucose, Bld: 108 mg/dL — ABNORMAL HIGH (ref 70–99)

## 2012-03-21 LAB — CBC
MCH: 29.5 pg (ref 26.0–34.0)
MCHC: 33.8 g/dL (ref 30.0–36.0)
MCV: 87.3 fL (ref 78.0–100.0)
Platelets: 357 10*3/uL (ref 150–400)
RBC: 4.58 MIL/uL (ref 4.22–5.81)
RDW: 12.5 % (ref 11.5–15.5)

## 2012-03-21 LAB — PROTIME-INR: Prothrombin Time: 16.2 seconds — ABNORMAL HIGH (ref 11.6–15.2)

## 2012-03-21 SURGERY — ECHOCARDIOGRAM, TRANSESOPHAGEAL
Anesthesia: Moderate Sedation

## 2012-03-21 MED ORDER — SODIUM CHLORIDE 0.45 % IV SOLN: INTRAVENOUS | Status: DC

## 2012-03-21 MED ORDER — LIDOCAINE VISCOUS 2 % MT SOLN
OROMUCOSAL | Status: AC
  Filled 2012-03-21: qty 15

## 2012-03-21 MED ORDER — AMLODIPINE BESYLATE 10 MG PO TABS
10.0000 mg | ORAL_TABLET | Freq: Every day | ORAL | Status: DC
  Administered 2012-03-21 – 2012-03-24 (×4): 10 mg via ORAL
  Filled 2012-03-21 (×4): qty 1

## 2012-03-21 MED ORDER — FENTANYL CITRATE 0.05 MG/ML IJ SOLN
INTRAMUSCULAR | Status: DC | PRN
  Administered 2012-03-21 (×2): 25 ug via INTRAVENOUS
  Administered 2012-03-21: 12.5 ug via INTRAVENOUS
  Administered 2012-03-21: 25 ug via INTRAVENOUS

## 2012-03-21 MED ORDER — FENTANYL CITRATE 0.05 MG/ML IJ SOLN
INTRAMUSCULAR | Status: AC
  Filled 2012-03-21: qty 2

## 2012-03-21 MED ORDER — SODIUM CHLORIDE 0.9 % IJ SOLN: 3.0000 mL | INTRAMUSCULAR | Status: DC | PRN

## 2012-03-21 MED ORDER — BENZOCAINE 20 % MT SOLN: 1.0000 "application " | OROMUCOSAL | Status: DC | PRN

## 2012-03-21 MED ORDER — SODIUM CHLORIDE 0.9 % IJ SOLN: 3.0000 mL | Freq: Two times a day (BID) | INTRAMUSCULAR | Status: DC

## 2012-03-21 MED ORDER — MIDAZOLAM HCL 10 MG/2ML IJ SOLN
INTRAMUSCULAR | Status: DC | PRN
  Administered 2012-03-21 (×2): 2 mg via INTRAVENOUS
  Administered 2012-03-21: 1 mg via INTRAVENOUS
  Administered 2012-03-21: 2 mg via INTRAVENOUS

## 2012-03-21 MED ORDER — MIDAZOLAM HCL 10 MG/2ML IJ SOLN: 10.0000 mg | Freq: Once | INTRAMUSCULAR | Status: DC

## 2012-03-21 MED ORDER — POTASSIUM CHLORIDE CRYS ER 20 MEQ PO TBCR
40.0000 meq | EXTENDED_RELEASE_TABLET | Freq: Once | ORAL | Status: AC
  Administered 2012-03-21: 40 meq via ORAL
  Filled 2012-03-21: qty 2

## 2012-03-21 MED ORDER — LIDOCAINE VISCOUS 2 % MT SOLN
OROMUCOSAL | Status: DC | PRN
  Administered 2012-03-21: 20 mL via OROMUCOSAL

## 2012-03-21 MED ORDER — FENTANYL CITRATE 0.05 MG/ML IJ SOLN: 250.0000 ug | Freq: Once | INTRAMUSCULAR | Status: DC

## 2012-03-21 MED ORDER — SODIUM CHLORIDE 0.9 % IV SOLN: 250.0000 mL | INTRAVENOUS | Status: DC | PRN

## 2012-03-21 MED ORDER — BENZOCAINE 20 % MT SOLN
1.0000 "application " | OROMUCOSAL | Status: DC | PRN
  Filled 2012-03-21: qty 57

## 2012-03-21 MED ORDER — MIDAZOLAM HCL 10 MG/2ML IJ SOLN
INTRAMUSCULAR | Status: AC
  Filled 2012-03-21: qty 2

## 2012-03-21 MED ORDER — WARFARIN SODIUM 10 MG PO TABS
10.0000 mg | ORAL_TABLET | Freq: Once | ORAL | Status: AC
  Administered 2012-03-21: 10 mg via ORAL
  Filled 2012-03-21: qty 1

## 2012-03-21 NOTE — Progress Notes (Addendum)
ANTICOAGULATION CONSULT NOTE - Follow Up Consult  Pharmacy Consult for coumadin + heparin Indication: RLE DVT, hx TIA/CVA, PFO  Vital Signs: Temp: 98.1 F (36.7 C) (04/12 0500) BP: 142/91 mmHg (04/12 0500) Pulse Rate: 77  (04/12 0500)  Labs:  Basename 03/21/12 0600 03/20/12 0858 03/19/12 2233 03/19/12 1605  HGB 13.5 14.2 -- --  HCT 40.0 41.3 -- 41.7  PLT 357 339 -- 336  APTT -- -- -- 35  LABPROT 16.2* 14.5 -- 13.8  INR 1.27 1.11 -- 1.04  HEPARINUNFRC 0.50 0.74* 1.12* --  CREATININE 0.98 0.88 -- 1.16  CKTOTAL -- -- -- --  CKMB -- -- -- --  TROPONINI -- -- -- --   Estimated Creatinine Clearance: 114.9 ml/min (by C-G formula based on Cr of 0.98).  Medications:  Infusions:    . sodium chloride    . heparin 1,250 Units/hr (03/21/12 0205)   Assessment: 50 yom with history of DVT and new DVT on heparin and coumadin for anticoagulation. Today is day #3 of overlap therapy. Heparin level is therapeutic at 0.5, INR is subtherapeutic at 1.28. INR is starting to trend up a bit but may need to increase dose tomorrow. No bleeding noted, CBC is very stable.   Goal of Therapy:  INR 2-3 Heparin level 0.3-0.7 units/ml   Plan:  1. Continue heparin gtt at 1250units/hr 2. Coumadin 10mg  PO x 1 tonight 3. F/u AM INR and Heparin level  Breasia Karges, Drake Leach 03/21/2012,9:47 AM

## 2012-03-21 NOTE — Brief Op Note (Signed)
03/19/2012 - 03/21/2012  12:57 PM  PATIENT:  Tony Davis.  50 y.o. male  Full report of TEE in [facility]

## 2012-03-21 NOTE — Progress Notes (Signed)
    SUBJECTIVE: No chest pain. No SOB. Mild right upper leg pain.   BP 142/91  Pulse 77  Temp(Src) 98.1 F (36.7 C) (Oral)  Resp 20  Ht 5\' 11"  (1.803 m)  Wt 236 lb 8 oz (107.276 kg)  BMI 32.99 kg/m2  SpO2 99%  Intake/Output Summary (Last 24 hours) at 03/21/12 0454 Last data filed at 03/20/12 1805  Gross per 24 hour  Intake   1528 ml  Output   1075 ml  Net    453 ml    PHYSICAL EXAM General: Well developed, well nourished, in no acute distress. Alert and oriented x 3.  Psych:  Good affect, responds appropriately Neck: No JVD. No masses noted.  Lungs: Clear bilaterally with no wheezes or rhonci noted.  Heart: RRR with no murmurs noted. Abdomen: Bowel sounds are present. Soft, non-tender.  Extremities: No lower extremity edema.   LABS: Basic Metabolic Panel:  Basename 03/20/12 0858 03/19/12 1605  NA 140 145  K 3.5 3.5  CL 103 107  CO2 26 25  GLUCOSE 107* 104*  BUN 13 16  CREATININE 0.88 1.16  CALCIUM 9.2 10.2  MG -- 1.8  PHOS -- --   CBC:  Basename 03/21/12 0600 03/20/12 0858 03/19/12 1605  WBC 8.5 9.5 --  NEUTROABS -- -- 5.2  HGB 13.5 14.2 --  HCT 40.0 41.3 --  MCV 87.3 86.6 --  PLT 357 339 --    Current Meds:    . amLODipine  5 mg Oral Daily  . aspirin EC  81 mg Oral Daily  . atorvastatin  80 mg Oral q1800  . losartan  100 mg Oral Daily  . sodium chloride  3 mL Intravenous Q12H  . warfarin  10 mg Oral ONCE-1800  . Warfarin - Pharmacist Dosing Inpatient   Does not apply q1800    ASSESSMENT AND PLAN: 51 yo AAM with history of CAD, TIA/CVA, PFO with recent admission to a hospital in [place] with neurological changes c/w CVA and found to have a right lower extremity DVT. He left that hospital against medical advice and came to my clinic on 03/19/12. He was not compliant with his Lovenox injections nor his coumadin therapy. He refused to continue to use Lovenox because of abdominal wall pain. He was admitted for IV heparin as a bridge while coumadin is  becoming therapeutic. Of note, pt had been referred in August of 2012 for possible PFO closure but he cancelled that appointment and did not reschedule.   1. DVT: On heparin drip as a bridge while coumadin becoming therapeutic. He will need to be an inpatient until his INR is greater than 2.0.   2. PFO: He may need closure but for now will anti-coagulate and will review case with Dr. Excell Seltzer to plan potential closure at a later time. NPO this am. Plan TEE to further define anatomy of atrial septum.   3. TIA/CVA: I have reviewed the CT head and MRI head from the outside hospital and there is evidence of old stroke but no acute CVA. No evidence of hemorrhage. He is followed as an outpatient by Dr. Modesto Charon with [facility]. He continues to have weakness on the left side but no changes. He will need lifelong coumadin.   4. Hypokalemia: Replace this am  5. HTN: BP elevated. Will increase Norvasc to 10 mg po Qdaily.      Tony Davis  4/12/20137:14 AM

## 2012-03-21 NOTE — Progress Notes (Signed)
  Echocardiogram Echocardiogram Transesophageal has been performed.  Tony Davis [facility] 03/21/2012, 12:53 PM

## 2012-03-22 DIAGNOSIS — Q211 Atrial septal defect: Secondary | ICD-10-CM

## 2012-03-22 DIAGNOSIS — I82409 Acute embolism and thrombosis of unspecified deep veins of unspecified lower extremity: Principal | ICD-10-CM

## 2012-03-22 LAB — CBC
HCT: 40.3 % (ref 39.0–52.0)
Hemoglobin: 14 g/dL (ref 13.0–17.0)
RBC: 4.64 MIL/uL (ref 4.22–5.81)
RDW: 12.5 % (ref 11.5–15.5)
WBC: 10.4 10*3/uL (ref 4.0–10.5)

## 2012-03-22 LAB — PROTIME-INR: INR: 1.42 (ref 0.00–1.49)

## 2012-03-22 LAB — BASIC METABOLIC PANEL
Chloride: 107 mEq/L (ref 96–112)
Creatinine, Ser: 0.99 mg/dL (ref 0.50–1.35)
GFR calc Af Amer: >90 mL/min (ref >90)

## 2012-03-22 LAB — HEPARIN LEVEL (UNFRACTIONATED): Heparin Unfractionated: 0.6 IU/mL (ref 0.30–0.70)

## 2012-03-22 MED ORDER — WARFARIN SODIUM 7.5 MG PO TABS
15.0000 mg | ORAL_TABLET | Freq: Once | ORAL | Status: AC
  Administered 2012-03-22: 15 mg via ORAL
  Filled 2012-03-22 (×2): qty 2

## 2012-03-22 NOTE — Progress Notes (Signed)
Patient ID: Tony Levin., male   DOB: May 04, 1961, 51 y.o.   MRN: [id number]    SUBJECTIVE: No chest pain. No SOB. Mild right upper leg pain.   BP 140/84  Pulse 80  Temp(Src) 97.1 F (36.2 C) (Oral)  Resp 18  Ht 5\' 11"  (1.803 m)  Wt 232 lb 11.2 oz (105.552 kg)  BMI 32.46 kg/m2  SpO2 97%  Intake/Output Summary (Last 24 hours) at 03/22/12 1120 Last data filed at 03/22/12 0900  Gross per 24 hour  Intake    720 ml  Output   2500 ml  Net  -1780 ml    PHYSICAL EXAM General: Well developed, well nourished, in no acute distress. Alert and oriented x 3.  Psych:  Good affect, responds appropriately Neck: No JVD. No masses noted.  Lungs: Clear bilaterally with no wheezes or rhonci noted.  Heart: RRR with no murmurs noted. Abdomen: Bowel sounds are present. Soft, non-tender.  Extremities: No lower extremity edema.   LABS: Basic Metabolic Panel:  Basename 03/22/12 0600 03/21/12 0600 03/19/12 1605  NA 142 142 --  K 3.9 3.5 --  CL 107 105 --  CO2 24 24 --  GLUCOSE 104* 108* --  BUN 18 19 --  CREATININE 0.99 0.98 --  CALCIUM 9.5 9.4 --  MG -- -- 1.8  PHOS -- -- --   CBC:  Basename 03/22/12 0600 03/21/12 0600 03/19/12 1605  WBC 10.4 8.5 --  NEUTROABS -- -- 5.2  HGB 14.0 13.5 --  HCT 40.3 40.0 --  MCV 86.9 87.3 --  PLT 363 357 --    Current Meds:    . amLODipine  10 mg Oral Daily  . aspirin EC  81 mg Oral Daily  . atorvastatin  80 mg Oral q1800  . losartan  100 mg Oral Daily  . warfarin  10 mg Oral ONCE-1800  . Warfarin - Pharmacist Dosing Inpatient   Does not apply q1800  . DISCONTD: fentaNYL  250 mcg Intravenous Once  . DISCONTD: fentaNYL  250 mcg Intravenous Once  . DISCONTD: midazolam  10 mg Intravenous Once  . DISCONTD: midazolam  10 mg Intravenous Once  . DISCONTD: sodium chloride  3 mL Intravenous Q12H  . DISCONTD: sodium chloride  3 mL Intravenous Q12H  . DISCONTD: sodium chloride  3 mL Intravenous Q12H    ASSESSMENT AND PLAN: 51 yo AAM with  history of CAD, TIA/CVA, PFO with recent admission to a hospital in [place] with neurological changes c/w CVA and found to have a right lower extremity DVT. He left that hospital against medical advice and came to my clinic on 03/19/12. He was not compliant with his Lovenox injections nor his coumadin therapy. He refused to continue to use Lovenox because of abdominal wall pain. He was admitted for IV heparin as a bridge while coumadin is becoming therapeutic. Of note, pt had been referred in August of 2012 for possible PFO closure but he cancelled that appointment and did not reschedule.   1. DVT: On heparin drip as a bridge while coumadin becoming therapeutic. He will need to be an inpatient until his INR is greater than 2.0. Only 1.4 today  2. PFO: From Dr Tenny Craw' note small fenestration does not appear to be anything that needs closure  3. TIA/CVA: I have reviewed the CT head and MRI head from the outside hospital and there is evidence of old stroke but no acute CVA. No evidence of hemorrhage. He is followed as an outpatient  by Dr. Modesto Charon with [facility]. He continues to have weakness on the left side but no changes. He will need lifelong coumadin.   4. Hypokalemia: Replace this am  5. HTN: BP elevated. Will increase Norvasc to 10 mg po Qdaily.    15 mg of coumadin today  Charlton Haws  4/13/201311:20 AM

## 2012-03-22 NOTE — Progress Notes (Signed)
ANTICOAGULATION CONSULT NOTE - Follow Up Consult  Pharmacy Consult for Heparin and Coumadin Indication: RLE DVT, hx TIA/CVA  Allergies  Allergen Reactions  . Iodine     REACTION: anaphylaxis    Patient Measurements: Height: 5\' 11"  (180.3 cm) Weight: 232 lb 11.2 oz (105.552 kg) IBW/kg (Calculated) : 75.3  Heparin Dosing Weight: 98 kg  Vital Signs: Temp: 97.1 F (36.2 C) (04/13 0545) BP: 140/84 mmHg (04/13 0545) Pulse Rate: 80  (04/13 0545)  Labs:  Basename 03/22/12 0600 03/21/12 0600 03/20/12 0858 03/19/12 1605  HGB 14.0 13.5 -- --  HCT 40.3 40.0 41.3 --  PLT 363 357 339 --  APTT -- -- -- 35  LABPROT 17.6* 16.2* 14.5 --  INR 1.42 1.27 1.11 --  HEPARINUNFRC 0.60 0.50 0.74* --  CREATININE 0.99 0.98 0.88 --  CKTOTAL -- -- -- --  CKMB -- -- -- --  TROPONINI -- -- -- --   Estimated Creatinine Clearance: 112.8 ml/min (by C-G formula based on Cr of 0.99).  Assessment:   Heparin/Coumadin overlap day #4.   Heparin level is therapeutic on 1250 units/hr.   INR 1.42. Slow to increase.  Has had Coumadin 10 mg daily x 3 since here, 5 mg at home prior to that.  Goal of Therapy:  INR 2-3 Heparin level 0.3-0.7   Plan:    Will continue heparin drip to 1250 units/hr.   Coumadin 15 mg x 1 today,as noted by Dr. Eden Emms.   Next heparin level, CBC and PT/INR in am.  Dennie Fetters, RPh Pager: [phone] 03/22/2012,2:20 PM

## 2012-03-22 NOTE — Progress Notes (Signed)
Upset that his breakfast meal did not have any meat on it.  States "I am 240 lbs and 6 foot tall, I need food. i can't eat this and  I won't eat it. "  Spoke with arguello and he said give him a regular diet.

## 2012-03-23 LAB — CBC
HCT: 40.2 % (ref 39.0–52.0)
Platelets: 371 10*3/uL (ref 150–400)
RDW: 12.6 % (ref 11.5–15.5)
WBC: 9.5 10*3/uL (ref 4.0–10.5)

## 2012-03-23 LAB — PROTIME-INR: INR: 2 — ABNORMAL HIGH (ref 0.00–1.49)

## 2012-03-23 LAB — HEPARIN LEVEL (UNFRACTIONATED): Heparin Unfractionated: 0.56 IU/mL (ref 0.30–0.70)

## 2012-03-23 MED ORDER — WARFARIN SODIUM 7.5 MG PO TABS
7.5000 mg | ORAL_TABLET | Freq: Once | ORAL | Status: AC
  Administered 2012-03-23: 7.5 mg via ORAL
  Filled 2012-03-23: qty 1

## 2012-03-23 NOTE — Progress Notes (Signed)
ANTICOAGULATION CONSULT NOTE - Follow Up Consult  Pharmacy Consult for Heparin and Coumadin Indication: RLE DVT, hx TIA/CVA  Allergies  Allergen Reactions  . Iodine     REACTION: anaphylaxis    Patient Measurements: Height: 5\' 11"  (180.3 cm) Weight: 232 lb 11.2 oz (105.552 kg) IBW/kg (Calculated) : 75.3  Heparin Dosing Weight: 98 kg  Vital Signs: Temp: 98.1 F (36.7 C) (04/14 0615) BP: 119/77 mmHg (04/14 0615) Pulse Rate: 88  (04/14 0615)  Labs:  Basename 03/23/12 0700 03/22/12 0600 03/21/12 0600  HGB 13.7 14.0 --  HCT 40.2 40.3 40.0  PLT 371 363 357  APTT -- -- --  LABPROT 23.0* 17.6* 16.2*  INR 2.00* 1.42 1.27  HEPARINUNFRC 0.56 0.60 0.50  CREATININE -- 0.99 0.98  CKTOTAL -- -- --  CKMB -- -- --  TROPONINI -- -- --   Estimated Creatinine Clearance: 112.8 ml/min (by C-G formula based on Cr of 0.99).  Assessment:   Heparin/Coumadin overlap day #5.   Heparin level remains therapeutic on 1250 units/hr.   INR up to 2.0 after Coumadin 15 mg dose 4/13.   Agree with Coumadin 7.5 mg dose today, already ordered by Dr. Eden Emms.  Goal of Therapy:  INR 2-3 Heparin level 0.3-0.7   Plan:    Will continue heparin drip to 1250 units/hr.   Coumadin 7.5 mg x 1 today as per Dr. Eden Emms.   Next heparin level, CBC and PT/INR in am.   Should be able to stop heparin in am.  Dennie Fetters, RPh Pager: [phone] 03/23/2012,12:45 PM

## 2012-03-23 NOTE — Progress Notes (Signed)
Placed pt on nasal cpap for rest, autotitrate settings 5-20cm h2o, pt tolerating well at this time.

## 2012-03-23 NOTE — Progress Notes (Signed)
   Patient ID: Tony Levin., male   DOB: 1961-09-10, 51 y.o.   MRN: [id number]    SUBJECTIVE: No chest pain. No SOB. Mild right upper leg pain.   BP 119/77  Pulse 88  Temp(Src) 98.1 F (36.7 C) (Oral)  Resp 18  Ht 5\' 11"  (1.803 m)  Wt 232 lb 11.2 oz (105.552 kg)  BMI 32.46 kg/m2  SpO2 95%  Intake/Output Summary (Last 24 hours) at 03/23/12 1111 Last data filed at 03/23/12 0900  Gross per 24 hour  Intake 1122.5 ml  Output   1025 ml  Net   97.5 ml    PHYSICAL EXAM General: Well developed, well nourished, in no acute distress. Alert and oriented x 3.  Psych:  Good affect, responds appropriately Neck: No JVD. No masses noted.  Lungs: Clear bilaterally with no wheezes or rhonci noted.  Heart: RRR with no murmurs noted. Abdomen: Bowel sounds are present. Soft, non-tender.  Extremities: No lower extremity edema.   LABS: Basic Metabolic Panel:  Basename 03/22/12 0600 03/21/12 0600  NA 142 142  K 3.9 3.5  CL 107 105  CO2 24 24  GLUCOSE 104* 108*  BUN 18 19  CREATININE 0.99 0.98  CALCIUM 9.5 9.4  MG -- --  PHOS -- --   CBC:  Basename 03/23/12 0700 03/22/12 0600  WBC 9.5 10.4  NEUTROABS -- --  HGB 13.7 14.0  HCT 40.2 40.3  MCV 86.3 86.9  PLT 371 363    Current Meds:    . amLODipine  10 mg Oral Daily  . aspirin EC  81 mg Oral Daily  . atorvastatin  80 mg Oral q1800  . losartan  100 mg Oral Daily  . warfarin  15 mg Oral ONCE-1800  . Warfarin - Pharmacist Dosing Inpatient   Does not apply q1800    ASSESSMENT AND PLAN: 51 yo AAM with history of CAD, TIA/CVA, PFO with recent admission to a hospital in [place] with neurological changes c/w CVA and found to have a right lower extremity DVT. He left that hospital against medical advice and came to my clinic on 03/19/12. He was not compliant with his Lovenox injections nor his coumadin therapy. He refused to continue to use Lovenox because of abdominal wall pain. He was admitted for IV heparin as a bridge while  coumadin is becoming therapeutic. Of note, pt had been referred in August of 2012 for possible PFO closure but he cancelled that appointment and did not reschedule.   1. DVT: INR 2.0 today.  Continue overlap  7.5 coumadin today.  D/C in am  2. PFO: From Dr Tenny Craw' note small fenestration does not appear to be anything that needs closure  3. TIA/CVA: I have reviewed the CT head and MRI head from the outside hospital and there is evidence of old stroke but no acute CVA. No evidence of hemorrhage. He is followed as an outpatient by Dr. Modesto Charon with [facility]. He continues to have weakness on the left side but no changes. He will need lifelong coumadin.   4. Hypokalemia: Replace this am  5. HTN: BP elevated. Will increase Norvasc to 10 mg po Qdaily.   7.5 mg of coumadin today  Charlton Haws  4/14/201311:11 AM

## 2012-03-24 ENCOUNTER — Encounter (HOSPITAL_COMMUNITY): Payer: Self-pay | Admitting: Internal Medicine

## 2012-03-24 DIAGNOSIS — R531 Weakness: Secondary | ICD-10-CM

## 2012-03-24 DIAGNOSIS — E876 Hypokalemia: Secondary | ICD-10-CM

## 2012-03-24 DIAGNOSIS — Z720 Tobacco use: Secondary | ICD-10-CM

## 2012-03-24 LAB — CBC
Hemoglobin: 14.1 g/dL (ref 13.0–17.0)
MCHC: 34 g/dL (ref 30.0–36.0)
Platelets: 373 10*3/uL (ref 150–400)
RDW: 12.5 % (ref 11.5–15.5)

## 2012-03-24 LAB — PROTIME-INR
INR: 2.26 — ABNORMAL HIGH (ref 0.00–1.49)
Prothrombin Time: 25.3 seconds — ABNORMAL HIGH (ref 11.6–15.2)

## 2012-03-24 MED ORDER — WARFARIN SODIUM 5 MG PO TABS: 7.5000 mg | ORAL_TABLET | Freq: Every day | ORAL | Status: DC

## 2012-03-24 MED ORDER — ASPIRIN 81 MG PO TBEC: 81.0000 mg | DELAYED_RELEASE_TABLET | Freq: Every day | ORAL | Status: AC

## 2012-03-24 MED ORDER — NITROGLYCERIN 0.4 MG SL SUBL: 0.4000 mg | SUBLINGUAL_TABLET | SUBLINGUAL | Status: DC | PRN

## 2012-03-24 MED ORDER — AMLODIPINE BESYLATE 10 MG PO TABS: 10.0000 mg | ORAL_TABLET | Freq: Every day | ORAL | Status: DC

## 2012-03-24 MED ORDER — AMLODIPINE BESYLATE 5 MG PO TABS: 10.0000 mg | ORAL_TABLET | Freq: Every day | ORAL | Status: DC

## 2012-03-24 MED ORDER — WARFARIN SODIUM 5 MG PO TABS: 7.5000 mg | ORAL_TABLET | Freq: Once | ORAL | Status: DC

## 2012-03-24 MED ORDER — WARFARIN SODIUM 7.5 MG PO TABS
7.5000 mg | ORAL_TABLET | Freq: Once | ORAL | Status: DC
  Filled 2012-03-24: qty 1

## 2012-03-24 MED ORDER — WARFARIN SODIUM 7.5 MG PO TABS: 7.5000 mg | ORAL_TABLET | Freq: Every day | ORAL | Status: DC

## 2012-03-24 NOTE — Discharge Summary (Signed)
See full note this am. cdm 

## 2012-03-24 NOTE — Progress Notes (Signed)
SUBJECTIVE: No complaints this am. No chest pain or SOB. Left sided weakness is better.   BP 135/81  Pulse 92  Temp(Src) 98 F (36.7 C) (Oral)  Resp 16  Ht 5\' 11"  (1.803 m)  Wt 232 lb 11.2 oz (105.552 kg)  BMI 32.46 kg/m2  SpO2 100%  Intake/Output Summary (Last 24 hours) at 03/24/12 0865 Last data filed at 03/24/12 0645  Gross per 24 hour  Intake 776.87 ml  Output    425 ml  Net 351.87 ml    PHYSICAL EXAM General: Well developed, well nourished, in no acute distress. Alert and oriented x 3.  Psych:  Good affect, responds appropriately Neck: No JVD. No masses noted.  Lungs: Clear bilaterally with no wheezes or rhonci noted.  Heart: RRR with no murmurs noted. Abdomen: Bowel sounds are present. Soft, non-tender.  Extremities: No lower extremity edema.   LABS: Basic Metabolic Panel:  Basename 03/22/12 0600  NA 142  K 3.9  CL 107  CO2 24  GLUCOSE 104*  BUN 18  CREATININE 0.99  CALCIUM 9.5  MG --  PHOS --   CBC:  Basename 03/23/12 0700 03/22/12 0600  WBC 9.5 10.4  NEUTROABS -- --  HGB 13.7 14.0  HCT 40.2 40.3  MCV 86.3 86.9  PLT 371 363    Current Meds:    . amLODipine  10 mg Oral Daily  . aspirin EC  81 mg Oral Daily  . atorvastatin  80 mg Oral q1800  . losartan  100 mg Oral Daily  . warfarin  7.5 mg Oral ONCE-1800  . Warfarin - Pharmacist Dosing Inpatient   Does not apply q1800    ASSESSMENT AND PLAN: 51 yo AAM with history of CAD, TIA/CVA, PFO with recent admission to a hospital in [place] with neurological changes c/w CVA and found to have a right lower extremity DVT. He left that hospital against medical advice and came to my clinic on 03/19/12. He was not compliant with his Lovenox injections nor his coumadin therapy. He refused to continue to use Lovenox because of abdominal wall pain. He was admitted for IV heparin as a bridge while coumadin is becoming therapeutic. Of note, pt had been referred in August of 2012 for possible PFO closure but  he cancelled that appointment and did not reschedule. TEE Friday with small fenestration but no large PFO.   1. DVT: INR 2.26 today. Will d/c home today on coumadin with follow up planned in the coumadin clinic.   2. PFO: From Dr Tenny Craw' note small fenestration does not appear to be anything that needs closure. I have discussed with Dr. Excell Seltzer and we will not plan PFO closure. He will need long term anticoagulation.   3. TIA/CVA: I have reviewed the CT head and MRI head from the outside hospital and there is evidence of old stroke but no acute CVA. No evidence of hemorrhage. He is followed as an outpatient by Dr. Modesto Charon with [facility]. He continues to have weakness on the left side but no changes. He will need lifelong coumadin. Follow up as an outpatient with Dr. Modesto Charon.   4. Hypokalemia: Resolved.   5. HTN: BP controlled.    6. CAD: Stable.   7. Dispo: d/c home today. Follow up coumadin clinic on Friday of this week. Follow up with Neurology, Dr. Modesto Charon 1-2 weeks. Follow up with me or Tereso Newcomer, PA-C in 2-3 weeks.  Med changes: Coumadin 7.5 mg po Qdaily and Norvasc increased from 5 mg to 10 mg daily.       Tony Davis  4/15/20137:12 AM

## 2012-03-24 NOTE — Progress Notes (Signed)
D/c orders received; IV removed with gauze on; pt remains in stable condition; pt meds, instructions, and f/u appointments reviewed and given to pt; pt instructed to keep an eye on his R leg swelling and pain; pt also instructed on precautions of using NSAIDs and bleeding risk factors while on coumadin; pt d/c to home

## 2012-03-24 NOTE — Discharge Summary (Signed)
Discharge Summary   Patient ID: Tony Lucena.,  MRN: [id number], DOB/AGE: 1961/04/18 51 y.o.  Admit date: 03/19/2012 Discharge date: 03/24/2012  Discharge Diagnoses Principal Problem:  *DVT (deep venous thrombosis) Active Problems:  HYPERLIPIDEMIA  HYPERTENSION  CAD, NATIVE VESSEL  PATENT FORAMEN OVALE  Hypokalemia  Left-sided weakness  Tobacco abuse   Allergies Allergies  Allergen Reactions  . Iodine     REACTION: anaphylaxis    Diagnostic Studies/Procedures  TRANSESOPHAGEAL ECHOCARDIOGRAM- 03/21/12  Left ventricle: LVEF is normal. ------------------------------------------------------------ Aortic valve: AV is normal NO AI. ------------------------------------------------------------ Aorta: Normal thoracic aorta. ------------------------------------------------------------ Mitral valve: Mitral valve is normal . No signif MR. ------------------------------------------------------------ Left atrium: No evidence of thrombus in the atrial cavity or appendage. ------------------------------------------------------------ Atrial septum: WIth color doppler there is suspicion that the interatrial septum may have small fenestrations. LA pressure is higher than RA so flow across is not seen by color doppler. With injection of agitated saline there are a few large bubbles that appear in LA again supporting above. ------------------------------------------------------------ Pulmonic valve: Pulmonic valve is normal. No PI. ------------------------------------------------------------ Tricuspid valve: Tricuspid valve is normal Trace TR. ------------------------------------------------------------ Post procedure conclusions Ascending Aorta:  - Normal thoracic aorta.  History of Present Illness  Tony Davis is a 51 yo AAM with history of CAD, TIA/CVA, PFO with recent admission to a hospital in [place] with neurological changes c/w CVA and found to have a right lower  extremity DVT. He left that hospital against medical advice and followed up with Dr. Clifton James on 03/19/12. He was not compliant with his Lovenox injections nor his coumadin therapy. He refused to continue to use Lovenox because of abdominal wall pain. He was subsequently directly-admitted on 03/19/12 from the office for IV heparin as a bridge while coumadin became therapeutic. The pt had been referred in August of 2012 for possible PFO closure but he cancelled that appointment and did not reschedule.   Hospital Course   He was started on Coumadin and Heparin bridge. The length of his admission largely depended on reaching a therapeutic INR. The patient did have a history of GIB in the past, however there was no evidence of active bleeding this admission. He tolerated this well overall.  The patient's PFO was discussed with Dr. Excell Seltzer to plan for potential closure. To further evaluate a TEE was ordered. This revealed evidence for fenestration over PFO. The full details are described above. This was felt to not require interventional closure, however the patient will require long-term anticoagulation.   Additionally, Dr. Clifton James reviewed the CT head and MRI head from the outside hospital on [place]. There was evidence of an old stroke, but no acute CVA or hemorrhage. This was confirmed prior to initiation of anticoagulation. He continued to have left-sided weakness on admission, however this improved throughout his stay. The recommendation was made to pursue lifelong Coumadin anticoagulation.   CAD remained stable, low-dose daily ASA was started and will be continued. There were no acute events noted on EKG. The patient did not complain of ischemic symptoms including chest pain, shortness of breath or diaphoresis.   He was noted to be mildly hypokalemic midway through the admission which was successfully repleted. Additionally, his BP was found to be elevated and Norvasc was up-titrated from 5 to 10mg  daily.  The patient tolerated this well with appropriate BP reduction.   Today, he is stable and ready for discharge. INR has steadily and appropriately risen to a therapeutic range. He will be discharged on the Coumadin dose listed  below. He will continue ASA and the up-titrated Norvasc dose. He will follow-up at the [facility] later this week. He will follow-up with Tereso Newcomer in 2-3 weeks. He will follow-up with Dr. Modesto Charon at [facility] for L-sided weakness. This information has been clearly outlined in the discharge AVS.    Discharge Vitals:  Blood pressure 130/87, pulse 84, temperature 98 F (36.7 C), temperature source Oral, resp. rate 16, height 5\' 11"  (1.803 m), weight 105.552 kg (232 lb 11.2 oz), SpO2 100.00%.   Labs: Recent Labs  Basename 03/24/12 0630 03/23/12 0700   WBC 11.1* 9.5   HGB 14.1 13.7   HCT 41.5 40.2   MCV 87.2 86.3   PLT 373 371    Lab 03/22/12 0600 03/21/12 0600 03/20/12 0858 03/19/12 1605  NA 142 142 140 --  K 3.9 3.5 3.5 --  CL 107 105 103 --  CO2 24 24 26  --  BUN 18 19 13  --  CREATININE 0.99 0.98 0.88 --  CALCIUM 9.5 9.4 9.2 --  PROT -- -- -- 7.2  BILITOT -- -- -- 0.3  ALKPHOS -- -- -- 103  ALT -- -- -- 96*  AST -- -- -- 76*  AMYLASE -- -- -- --  LIPASE -- -- -- --  GLUCOSE 104* 108* 107* --   Disposition:  Discharge Orders    Future Appointments: Provider: Department: Dept Phone: Center:   03/28/2012 9:30 AM [facility] [phone] None   03/28/2012 3:00 PM Milas Gain, MD [facility] [place] [phone] None   04/08/2012 11:30 AM Beatrice Lecher, PA [facility] [phone] [facility]     Follow-up Information    Follow up with [facility] on 03/28/2012. (At 9:30 AM. )    Contact information:   [address] [place] [address]       Follow up with Tereso Newcomer, PA on 04/08/2012. (At 11:30 AM. )    Contact information:   [address] [place] [address] [phone]       Follow up with Denton Meek, MD on 03/28/2012. (At 3:00 PM.)    Contact information:   [address] [facility] [place] [address] [phone]          Discharge Medications:  Medication List  As of 03/24/2012  9:41 AM   START taking these medications         * amLODipine 10 MG tablet   Commonly known as: NORVASC   Take 1 tablet (10 mg total) by mouth daily.      aspirin 81 MG EC tablet   Take 1 tablet (81 mg total) by mouth daily.     * Notice: This list has 1 medication(s) that are the same as other medications prescribed for you. Read the directions carefully, and ask your doctor or other care provider to review them with you.       CHANGE how you take these medications         * amLODipine 5 MG tablet   Commonly known as: NORVASC   Take 2 tablets (10 mg total) by mouth daily.   What changed: dose      warfarin 7.5 MG tablet   Commonly known as: COUMADIN   Take 1 tablet (7.5 mg total) by mouth daily.   What changed: - medication strength - dose     * Notice: This list has 1 medication(s) that are  the same as other medications prescribed for you. Read the directions carefully, and ask your doctor or other care provider to review them with you.       CONTINUE taking these medications         atorvastatin 80 MG tablet   Commonly known as: LIPITOR      losartan 100 MG tablet   Commonly known as: COZAAR      nitroGLYCERIN 0.4 MG SL tablet   Commonly known as: NITROSTAT         STOP taking these medications         enoxaparin 100 MG/ML injection      potassium chloride SA 20 MEQ tablet          Where to get your medications    These are the prescriptions that you need to pick up. We sent them to a specific pharmacy, so you will need to go there to get them.   [id number] [place], [place] - [address]    [address] [place] [address]    Phone: [phone]        amLODipine 10 MG tablet   warfarin 7.5 MG tablet         Information on where to get these meds is not yet available. Ask your nurse or doctor.         amLODipine 5 MG tablet   aspirin 81 MG EC tablet           Outstanding Labs/Studies: PT/INR on 04/19 at Coumadin clinic  Duration of Discharge Encounter: Greater than 30 minutes including physician time.  Signed, R. Hurman Horn, PA-C 03/24/2012, 9:41 AM

## 2012-03-24 NOTE — Plan of Care (Signed)
Problem: Consults Goal: Diagnosis - Venous Thromboembolism (VTE) Choose a selection  Outcome: Completed/Met Date Met:  03/24/12 DVT (Deep Vein Thrombosis)

## 2012-03-24 NOTE — Discharge Instructions (Signed)
PLEASE REMEMBER TO BRING ALL OF YOUR MEDICATIONS TO EACH OF YOUR FOLLOW-UP OFFICE VISITS.  PLEASE ATTEND ALL FOLLOW-UP APPOINTMENTS.   PLEASE TAKE ALL NEW MEDICATIONS AS PRESCRIBED.   PLEASE USE PRIOR NORVASC PRESCRIPTION AT NEW DOSE UNTIL EMPTY, THEN FILL NEW PRESCRIPTION.

## 2012-03-24 NOTE — Progress Notes (Signed)
ANTICOAGULATION CONSULT NOTE - Follow Up Consult  Pharmacy Consult for Heparin/Coumadin Indication: RLE DVT, hx TIA/CVA  Allergies  Allergen Reactions  . Iodine     REACTION: anaphylaxis    Patient Measurements: Height: 5\' 11"  (180.3 cm) Weight: 232 lb 11.2 oz (105.552 kg) IBW/kg (Calculated) : 75.3  Heparin Dosing Weight: 98 kg  Vital Signs: Temp: 98 F (36.7 C) (04/15 0600) BP: 130/87 mmHg (04/15 0600) Pulse Rate: 84  (04/15 0600)  Labs:  Basename 03/24/12 0630 03/23/12 0700 03/22/12 0600  HGB 14.1 13.7 --  HCT 41.5 40.2 40.3  PLT 373 371 363  APTT -- -- --  LABPROT 25.3* 23.0* 17.6*  INR 2.26* 2.00* 1.42  HEPARINUNFRC 0.67 0.56 0.60  CREATININE -- -- 0.99  CKTOTAL -- -- --  CKMB -- -- --  TROPONINI -- -- --   Estimated Creatinine Clearance: 112.8 ml/min (by C-G formula based on Cr of 0.99).  Assessment: 51 y.o. M on heparin/warfarin for new RLE DVT and hx TIA/CVA. The patient received 5 days of overlap with heparin/warfarin and has a therapeutic INR this a.m. (INR 2.26 << 2). Noted plans per Dr. Clifton James for the patient to be discharged today with a dose of 7.5 mg daily and to be followed up at the Coumadin clinic on Friday, 4/19. The patient has received warfarin education this admission.   Goal of Therapy:  INR 2-3   Plan:  1. D/c heparin drip 2. Warfarin 7.5 mg x 1 dose at 1800 today (if still here) 3. Agree with plans to continue with 7.5 mg daily with INR check on Friday, 4/19.  4. Will continue to monitor for any signs/symptoms of bleeding and will follow up with PT/INR in the a.m (if still here)  Georgina Pillion, PharmD, BCPS Clinical Pharmacist Pager: [phone] 03/24/2012 9:44 AM

## 2012-03-25 NOTE — Op Note (Signed)
Full report in [facility] 

## 2012-03-28 ENCOUNTER — Encounter: Payer: Self-pay | Admitting: Neurology

## 2012-03-28 ENCOUNTER — Ambulatory Visit (INDEPENDENT_AMBULATORY_CARE_PROVIDER_SITE_OTHER): Payer: BC Managed Care – PPO | Admitting: Neurology

## 2012-03-28 ENCOUNTER — Ambulatory Visit (INDEPENDENT_AMBULATORY_CARE_PROVIDER_SITE_OTHER): Payer: BC Managed Care – PPO

## 2012-03-28 VITALS — BP 110/68 | HR 88 | Wt 236.0 lb

## 2012-03-28 DIAGNOSIS — I6350 Cerebral infarction due to unspecified occlusion or stenosis of unspecified cerebral artery: Secondary | ICD-10-CM

## 2012-03-28 DIAGNOSIS — Z7901 Long term (current) use of anticoagulants: Secondary | ICD-10-CM | POA: Insufficient documentation

## 2012-03-28 DIAGNOSIS — I6930 Unspecified sequelae of cerebral infarction: Secondary | ICD-10-CM

## 2012-03-28 DIAGNOSIS — I82409 Acute embolism and thrombosis of unspecified deep veins of unspecified lower extremity: Secondary | ICD-10-CM

## 2012-03-28 DIAGNOSIS — Z8673 Personal history of transient ischemic attack (TIA), and cerebral infarction without residual deficits: Secondary | ICD-10-CM

## 2012-03-28 NOTE — Patient Instructions (Signed)

## 2012-03-28 NOTE — Progress Notes (Signed)
Dear Dr. Yetta Barre,  I saw  Tony Davis. back in [place] for his problem with new onset left sided weakness.  As you may recall, he is a 51 y.o. year old male with a history of a right caudate infarction who has a history of left sided weakness. He also has a known PFO that was apparently supposed to be closed in the past but he was lost to followup. When I saw him first in August 2012 he was having spells of TVL of his left eye.  An MRA at that time did not reveal any large vessel or small vessel stenosis.  I wanted the patient to have an eye exam from an ophthalmologist, and he says he had an eye exam, but cannot recall whether it was an ophthalmologist or optometrist.  He said all they wanted to do "was give him glasses".  He has complained of difficulty seeing out of his left eye since his original stroke in 2011.  His TVL stopped just after I saw him.  He does not necessarily endorse worsen vision in his left eye.  In any case, he was doing well until 2 weeks ago. He then had the sudden onset of left sided weakness along with facial weakness, similar to his previous stroke.  He was seen in [place], where they did an MRI brain(along with other unknown investigations) and said he had a "small stroke".  He apparently was told he had a DVT at the same time and was started on warfarin and bridged with Lovenox. An echo was done there which revealed his PFO.   It is unclear whether he had changes on his MRI.  He was released from hospital because his wife had to work but was told to go to the hospital in [place].  He called my office and our staff directed him to [facility].  They saw him in the ED but did not admit him.  He then saw Dr. Clifton James in cardiology who  admitted him urgently as he had stopped his lovenox because he was having problems at the injection site.  He then was started on heparin in hospital until his coumadin became therapeutic. TEE confirmed his PFO.  I could  not find documentation of an U/S confirming his DVT.  His last INR was 2.9.  He continues to have left sided weakness.  He says it is getting better.  He also complains of sensory change on that side.  He is walking with a cane.    Medical history, social history, and family history were reviewed and have not changed since the last clinic visit.  Current Outpatient Prescriptions on File Prior to Visit  Medication Sig Dispense Refill  . amLODipine (NORVASC) 10 MG tablet Take 1 tablet (10 mg total) by mouth daily.  30 tablet  3  . aspirin EC 81 MG EC tablet Take 1 tablet (81 mg total) by mouth daily.      Marland Kitchen atorvastatin (LIPITOR) 80 MG tablet Take 80 mg by mouth at bedtime.      Marland Kitchen losartan (COZAAR) 100 MG tablet Take 100 mg by mouth daily.        . nitroGLYCERIN (NITROSTAT) 0.4 MG SL tablet Place 1 tablet (0.4 mg total) under the tongue every 5 (five) minutes x 3 doses as needed for chest pain.  25 tablet  3  . warfarin (COUMADIN) 5 MG tablet Take 1.5 tablets (7.5 mg total) by mouth daily.  45 tablet  1  . DISCONTD: nitroGLYCERIN (NITROSTAT) 0.4 MG SL tablet Place 0.4 mg under the tongue every 5 (five) minutes as needed.        Marland Kitchen DISCONTD: amLODipine (NORVASC) 5 MG tablet Take 2 tablets (10 mg total) by mouth daily.        Allergies  Allergen Reactions  . Iodine     REACTION: anaphylaxis    ROS:  13 systems were reviewed and are notable for decreased vision in his left eye.  All other review of systems are unremarkable.  Exam: . Filed Vitals:   03/28/12 1502  BP: 110/68  Pulse: 88  Weight: 236 lb (107.049 kg)    In general, fatigued appearing man.  Mental status:   The patient is oriented to person, place and time. Recent and remote memory are intact. Attention span and concentration are normal. Language including repetition, naming, following commands are intact. Fund of knowledge of current and historical events, as well as vocabulary are normal.   Fundoscopy with dilated  OS, I cannot appreciate a vascular abnormality. Cranial Nerves: Pupils are equally round and reactive to light(no APD). Visual fields are normal OD, in left eye they are decreased in his nasal field.  VA 20/25 OD, 20/30 OS(corrected). Extraocular movements are intact without nystagmus. Facial sensation and muscles of mastication are intact. Muscles of facial expression are symmetric. Hearing intact to bilateral finger rub. Tongue protrusion protrudes to left, uvula, palate midline.  Shoulder shrug delayed on left  Motor:  No increased tone in UE.  + left pronator drift, 4/5 globally on the left.  Reflexes:  1+ thoughout, toes down.  Coordination:  Normal finger to nose  Sensory testing:  decreased vibration, light touch on his left hemibody.  Gait:  Hemiparetic gait.  Romberg negative.  MRI brain was reviewed from recent admission.  Unfortunately it was not a complete study.  However, no new infarction could be seen on the DWI sequences.  Impression/Recommendations:  1.  Ischemic stroke - It is unusual that we would not see a new area of infarction on DWI.  However, it was in excess of 7 days after his new left sided weakness began.  Unfortunately, we don't have a FLAIR given his claustrophobia.  I would like to see the imaging done in [place] to see if an MRI was done that documented a new ischemic stroke.  Also, I would like to see confirmation of the DVT by U/S.  I am going to see what records dr. Clifton James has as the patient reports he brought him records from [place].  The patient is going to call me in a week to confirm that I got these records.  Given his DVT and the PFO is it important to continue him on coumadin of course. 2.  Visual problems - Going back to my previous notes it looks like the patient's vision is unchanged.  I had wanted to refer him to ophthalmology but his is reluctant to go because of cost.    Lupita Raider. Modesto Charon, MD [facility], [place]

## 2012-04-04 ENCOUNTER — Ambulatory Visit (INDEPENDENT_AMBULATORY_CARE_PROVIDER_SITE_OTHER): Payer: BC Managed Care – PPO | Admitting: *Deleted

## 2012-04-04 DIAGNOSIS — Z7901 Long term (current) use of anticoagulants: Secondary | ICD-10-CM

## 2012-04-04 DIAGNOSIS — I82409 Acute embolism and thrombosis of unspecified deep veins of unspecified lower extremity: Secondary | ICD-10-CM

## 2012-04-04 DIAGNOSIS — I6350 Cerebral infarction due to unspecified occlusion or stenosis of unspecified cerebral artery: Secondary | ICD-10-CM

## 2012-04-08 ENCOUNTER — Ambulatory Visit (INDEPENDENT_AMBULATORY_CARE_PROVIDER_SITE_OTHER): Payer: BC Managed Care – PPO | Admitting: Physician Assistant

## 2012-04-08 ENCOUNTER — Encounter: Payer: Self-pay | Admitting: Physician Assistant

## 2012-04-08 VITALS — BP 126/90 | HR 83 | Ht 71.0 in | Wt 235.0 lb

## 2012-04-08 DIAGNOSIS — I10 Essential (primary) hypertension: Secondary | ICD-10-CM

## 2012-04-08 DIAGNOSIS — I6350 Cerebral infarction due to unspecified occlusion or stenosis of unspecified cerebral artery: Secondary | ICD-10-CM

## 2012-04-08 DIAGNOSIS — I2510 Atherosclerotic heart disease of native coronary artery without angina pectoris: Secondary | ICD-10-CM

## 2012-04-08 DIAGNOSIS — Q211 Atrial septal defect: Secondary | ICD-10-CM

## 2012-04-08 DIAGNOSIS — I82409 Acute embolism and thrombosis of unspecified deep veins of unspecified lower extremity: Secondary | ICD-10-CM

## 2012-04-08 DIAGNOSIS — Q2111 Secundum atrial septal defect: Secondary | ICD-10-CM

## 2012-04-08 NOTE — Patient Instructions (Signed)
PLEASE MAKE NURSE VISIT APPT FOR BLOOD PRESSURE CHECK 04/11/12 SAME DAY PT COMING IN FOR CVRR APPT, PT TO BRING BLOOD PRESSURE CUFF FROM HOME  BMET, LFT TODAY 401.1  DR. MCALHANY 6-8 WEEKS

## 2012-04-08 NOTE — Progress Notes (Signed)
[address] [place], [place]  [address] Phone: [phone] Fax:  [phone]  Date:  04/08/2012   Name:  Tony Davis.       DOB:  [birth year] MRN:  [id number]  PCP:  [name], MD, MD  Primary Cardiologist:  Dr. Verne Carrow  Primary Electrophysiologist:  None    History of Present Illness: Tony Davis. is a 51 y.o. male who returns for post hospital follow up.  He has a history of CAD, HTN, hyperlipidemia, impaired fasting glucose and obesity.  Seen by Dr. Clifton James in February 2011 for complaints of chest pain.  He arranged a stress test, an echo and carotid dopplers at the first visit. He missed his follow up appt. The carotid dopplers were normal.  He came in for his stress test and complained of neurological changes. He was seen by Dr. Riley Kill and the stress test was cancelled.  He was sent over to Dr. Stones office and had MRI/MRA of the head and neck. MRA showed no occlusive disease. MRI showed non-specific white matter changes but no clear infarct, possibly related to hypertension. His echo showed septal and inferobasal hypokinesis with mild concentric hypertrophy, EF 45-50%. He was seen here in October 2011 and was still having chest pains.  Dr. Clifton James arranged a left heart cath in the outpatient lab. He was found to have a stenosis in the mid Circumflex.  Had a recent h/o dark stools the day before the cath. The intervention was delayed pending a GI workup. He has had no further issues with dark stools at follow up and was brought back in 10/13/10 for planned PCI of the Circumflex.  A Promus drug eluting stent was placed without any complications. Repeat echo August 2012 with PFO with positive bubble study. I arranged for him to see Dr. Excell Seltzer for PFO closure but he skipped his appointment.   Added on to Dr. Gibson Ramp schedule for evaluation of left arm and leg weakness 4/10.  He was in [place] the week before and had acute onset  of left arm and leg weakness.  He was admitted.  CT head without acute stroke.  Venous dopplers with chronic DVT right femoral vein.  Carotids with <50% RICA stenosis, LICA ok. Echo with PFO. Echo 03/14/12 in [place] with normal LV size and function with PFO with positive bubble study. He was started on coumadin and Lovenox and left AMA from the hospital there. He was seen in [facility] ED on 03/17/12 and discharged home. He continued to have left sided weakness. He had not been using his Lovenox as prescribed and just started coumadin day prior to his visit.  He was admitted for Heparin and coumadin.  TEE was done 03/21/12:  EF normal, atrial septum with suspicion for interatrial septum fenestrations without flow across and few large bubbles noted in LA.  This was not felt to require closure.   Doing well since discharge.  Notes fatigue and continued left-sided weakness.  Otherwise, denies chest pain, shortness of breath, syncope, orthopnea, PND or edema.  He has followed up with Dr. Modesto Charon of neurology who wants to review his MRI and LE u/s from the outside hospital.     Past Medical History  Diagnosis Date  . CAD (coronary artery disease)     cath 10/11 with mild LAD, 80% mid circ, PLA 70%. no s/p promus DES circumflex artery 10/13/10  . GERD (gastroesophageal reflux disease)   . HLD (hyperlipidemia)   .  HTN (hypertension)   . Tobacco abuse   . CVA (cerebral vascular accident) 3/11  . TIA (transient ischemic attack) 3/11  . Hematuria 8/12  . Sleep apnea     states he does not use CPAP  . Arthritis   . PFO (patent foramen ovale)     echo 8/12: EF 55-65%, grade 2 diast dysfnx; +PFO on bubble study;  TEE 4/13: EF normal, atrial septum with suspicion for interatrial septum fenestrations without flow across and few large bubbles noted in LA.  This was not felt to require closure     Current Outpatient Prescriptions  Medication Sig Dispense Refill  . amLODipine (NORVASC) 10 MG tablet Take 1 tablet (10 mg  total) by mouth daily.  30 tablet  3  . aspirin EC 81 MG EC tablet Take 1 tablet (81 mg total) by mouth daily.      Marland Kitchen atorvastatin (LIPITOR) 80 MG tablet Take 80 mg by mouth at bedtime.      Marland Kitchen losartan (COZAAR) 100 MG tablet Take 100 mg by mouth daily.        . nitroGLYCERIN (NITROSTAT) 0.4 MG SL tablet Place 1 tablet (0.4 mg total) under the tongue every 5 (five) minutes x 3 doses as needed for chest pain.  25 tablet  3  . warfarin (COUMADIN) 5 MG tablet Take 1.5 tablets (7.5 mg total) by mouth daily.  45 tablet  1    Allergies: Allergies  Allergen Reactions  . Iodine     REACTION: anaphylaxis    History  Substance Use Topics  . Smoking status: Former Smoker -- 1.0 packs/day for 26 years    Types: Cigarettes    Quit date: 03/10/2012  . Smokeless tobacco: Never Used   Comment: 1/2 ppd x 35 years  Has already quit smoking  . Alcohol Use: No     ROS:  Please see the history of present illness.   He has had some gum bleeding.  Otherwise denies melena, hematochezia, hematuria, hemoptysis.  All other systems reviewed and negative.   PHYSICAL EXAM: VS:  BP 126/90  Pulse 83  Ht 5\' 11"  (1.803 m)  Wt 235 lb (106.595 kg)  BMI 32.78 kg/m2 Well nourished, well developed, in no acute distress HEENT: normal Neck: no JVD Cardiac:  normal S1, S2; RRR; no murmur Lungs:  clear to auscultation bilaterally, no wheezing, rhonchi or rales Abd: soft, nontender, no hepatomegaly Ext: no edema Skin: warm and dry Neuro:  CNs 2-12 intact, no focal abnormalities noted  EKG:  Sinus rhythm, heart rate 83, normal axis, no ischemic changes  ASSESSMENT AND PLAN:  1.  DVT He will remain on Coumadin for the foreseeable future.  This is managed in the Coumadin clinic.  He seems to be tolerating well.  2.  PFO As noted, transesophageal echocardiogram in the hospital did not demonstrate any findings that would necessitate PFO closure.  3.  CAD No angina.  Continue aspirin, statin.  Follow up with Dr.  Verne Carrow 6-8 weeks.  4.  Hypertension He has had some fluctuating blood pressures at home.  I have advised him to take his Norvasc in the morning and Cozaar in the afternoon.  I have advised him to bring his blood pressure cuff in to his next Coumadin clinic visit to have this checked with our Mercury cuff.  He will keep his blood pressure and apprise of those results.  Check a basic metabolic panel today.  5.  Elevated LFTs Lab Results  Component Value Date   ALT 96* 03/19/2012   AST 76* 03/19/2012   ALKPHOS 103 03/19/2012   BILITOT 0.3 03/19/2012    Repeat those today.  6.  CVA Continue follow up with neurology.  Luna Glasgow, PA-C  11:53 AM 04/08/2012

## 2012-04-09 LAB — BASIC METABOLIC PANEL
BUN: 15 mg/dL (ref 6–23)
CO2: 28 mEq/L (ref 19–32)
Chloride: 105 mEq/L (ref 96–112)
Creatinine, Ser: 1 mg/dL (ref 0.4–1.5)
Glucose, Bld: 86 mg/dL (ref 70–99)

## 2012-04-09 LAB — HEPATIC FUNCTION PANEL
ALT: 32 U/L (ref 0–53)
Total Protein: 7.4 g/dL (ref 6.0–8.3)

## 2012-04-10 ENCOUNTER — Telehealth: Payer: Self-pay | Admitting: *Deleted

## 2012-04-10 DIAGNOSIS — I10 Essential (primary) hypertension: Secondary | ICD-10-CM

## 2012-04-10 NOTE — Telephone Encounter (Signed)
pt notified of lab results and will have repeat bmet 5/17 and to increase dietary K+

## 2012-04-10 NOTE — Telephone Encounter (Signed)
Message copied by Tarri Fuller on Thu Apr 10, 2012 11:31 AM ------      Message from: [place], [place] T      Created: Wed Apr 09, 2012  5:17 PM       LFTs ok      K+ low      Increase dietary K+      Repeat BMET in 10-14 days      Tereso Newcomer, PA-C  5:16 PM 04/09/2012

## 2012-04-11 ENCOUNTER — Encounter: Payer: Self-pay | Admitting: Cardiovascular Disease

## 2012-04-11 ENCOUNTER — Ambulatory Visit (INDEPENDENT_AMBULATORY_CARE_PROVIDER_SITE_OTHER): Payer: BC Managed Care – PPO | Admitting: *Deleted

## 2012-04-11 DIAGNOSIS — Z7901 Long term (current) use of anticoagulants: Secondary | ICD-10-CM

## 2012-04-11 DIAGNOSIS — I82409 Acute embolism and thrombosis of unspecified deep veins of unspecified lower extremity: Secondary | ICD-10-CM

## 2012-04-11 DIAGNOSIS — I6350 Cerebral infarction due to unspecified occlusion or stenosis of unspecified cerebral artery: Secondary | ICD-10-CM

## 2012-04-14 ENCOUNTER — Encounter: Payer: Self-pay | Admitting: Cardiovascular Disease

## 2012-04-18 ENCOUNTER — Ambulatory Visit (INDEPENDENT_AMBULATORY_CARE_PROVIDER_SITE_OTHER): Payer: BC Managed Care – PPO | Admitting: *Deleted

## 2012-04-18 DIAGNOSIS — I6350 Cerebral infarction due to unspecified occlusion or stenosis of unspecified cerebral artery: Secondary | ICD-10-CM

## 2012-04-18 DIAGNOSIS — Z7901 Long term (current) use of anticoagulants: Secondary | ICD-10-CM

## 2012-04-18 DIAGNOSIS — I82409 Acute embolism and thrombosis of unspecified deep veins of unspecified lower extremity: Secondary | ICD-10-CM

## 2012-04-25 ENCOUNTER — Other Ambulatory Visit: Payer: BC Managed Care – PPO

## 2012-05-02 ENCOUNTER — Ambulatory Visit (INDEPENDENT_AMBULATORY_CARE_PROVIDER_SITE_OTHER): Payer: BC Managed Care – PPO | Admitting: Pharmacist

## 2012-05-02 DIAGNOSIS — I82409 Acute embolism and thrombosis of unspecified deep veins of unspecified lower extremity: Secondary | ICD-10-CM

## 2012-05-02 DIAGNOSIS — I6350 Cerebral infarction due to unspecified occlusion or stenosis of unspecified cerebral artery: Secondary | ICD-10-CM

## 2012-05-02 DIAGNOSIS — Z7901 Long term (current) use of anticoagulants: Secondary | ICD-10-CM

## 2012-05-06 ENCOUNTER — Other Ambulatory Visit: Payer: Self-pay | Admitting: *Deleted

## 2012-05-06 MED ORDER — ATORVASTATIN CALCIUM 80 MG PO TABS: 80.0000 mg | ORAL_TABLET | Freq: Every day | ORAL | Status: DC

## 2012-05-20 ENCOUNTER — Ambulatory Visit (INDEPENDENT_AMBULATORY_CARE_PROVIDER_SITE_OTHER): Payer: BC Managed Care – PPO | Admitting: Cardiovascular Disease

## 2012-05-20 ENCOUNTER — Ambulatory Visit (INDEPENDENT_AMBULATORY_CARE_PROVIDER_SITE_OTHER): Payer: BC Managed Care – PPO | Admitting: *Deleted

## 2012-05-20 ENCOUNTER — Telehealth: Payer: Self-pay | Admitting: Cardiovascular Disease

## 2012-05-20 ENCOUNTER — Encounter: Payer: Self-pay | Admitting: Cardiovascular Disease

## 2012-05-20 VITALS — BP 180/90 | HR 76 | Ht 71.0 in | Wt 244.8 lb

## 2012-05-20 DIAGNOSIS — I6350 Cerebral infarction due to unspecified occlusion or stenosis of unspecified cerebral artery: Secondary | ICD-10-CM

## 2012-05-20 DIAGNOSIS — I2510 Atherosclerotic heart disease of native coronary artery without angina pectoris: Secondary | ICD-10-CM

## 2012-05-20 DIAGNOSIS — Q211 Atrial septal defect: Secondary | ICD-10-CM

## 2012-05-20 DIAGNOSIS — Z7901 Long term (current) use of anticoagulants: Secondary | ICD-10-CM

## 2012-05-20 DIAGNOSIS — I82409 Acute embolism and thrombosis of unspecified deep veins of unspecified lower extremity: Secondary | ICD-10-CM

## 2012-05-20 LAB — BASIC METABOLIC PANEL
CO2: 26 mEq/L (ref 19–32)
Chloride: 109 mEq/L (ref 96–112)
Creatinine, Ser: 1.1 mg/dL (ref 0.4–1.5)

## 2012-05-20 LAB — LIPID PANEL
LDL Cholesterol: 118 mg/dL — ABNORMAL HIGH (ref 0–99)
Total CHOL/HDL Ratio: 5
Triglycerides: 138 mg/dL (ref 0.0–149.0)

## 2012-05-20 LAB — HEPATIC FUNCTION PANEL
Albumin: 4.3 g/dL (ref 3.5–5.2)
Alkaline Phosphatase: 102 U/L (ref 39–117)

## 2012-05-20 LAB — POCT INR: INR: 1.7

## 2012-05-20 MED ORDER — CARVEDILOL 6.25 MG PO TABS: 6.2500 mg | ORAL_TABLET | Freq: Two times a day (BID) | ORAL | Status: DC

## 2012-05-20 MED ORDER — PANTOPRAZOLE SODIUM 40 MG PO TBEC: 40.0000 mg | DELAYED_RELEASE_TABLET | Freq: Every day | ORAL | Status: DC

## 2012-05-20 NOTE — Assessment & Plan Note (Signed)
Will start Protonix 40 mg po QDaily.

## 2012-05-20 NOTE — Progress Notes (Signed)
History of Present Illness: 51 yo AAM with history of CAD, HTN, hyperlipidemia, impaired fasting glucose and obesity who is here today for cardiac follow up. I saw him in February 2011 for complaints of chest pain. I arranged a stress test, an echo and carotid dopplers at the first visit. He missed his follow up appt. The carotid dopplers were normal. He came in for his stress test and complained of neurological changes. He was seen by Dr. Riley Kill and the stress test was cancelled. He was sent over to Dr. Stones office and had MRI/MRA of the head and neck. MRA showed no occlusive disease. MRI showed non-specific white matter changes but no clear infarct, possibly related to hypertension. His echo shows septal and inferobasal hypokinesis with mild concentric hypertrophy, EF 45-50%. He was seen here in October 2011 and was still having chest pains. I arranged a left heart cath in the outpatient lab. He was found to have a stenosis in the mid Circumflex. He was brought back in 10/13/10 for planned PCI of the Circumflex. A Promus drug eluting stent was placed without any complications. Repeat echo August 2012 with PFO with positive bubble study. I arranged for him to see Dr. Excell Seltzer for PFO closure but he skipped his appointment. He was in [place] April 2013 and had acute onset of left arm and leg weakness. He was admitted there. CT head without acute stroke. Venous dopplers with chronic DVT right femoral vein. Carotids with <50% RICA stenosis, LICA ok. Echo with PFO. Echo 03/14/12 in [place] with normal LV size and function with PFO with positive bubble study. He was started on coumadin and Lovenox and left AMA from the hospital there. He was seen in [facility] ED on 03/17/12 and discharged home. I saw him in our office 03/19/12. At that time, he continued to have left sided weakness. He had not been compliant with plans for Lovenox and coumadin. He was admitted for Heparin and coumadin. TEE was done 03/21/12: EF normal,  atrial septum with suspicion for interatrial septum fenestrations without flow across and few large bubbles noted in LA. This was not felt to require closure. He has been maintained on coumadin and has been followed in our coumadin clinic. Last visit in our office was with Tereso Newcomer, PA-C on 04/08/12. He has also been seen in f/u by Dr. Modesto Charon with Neurology.   He is here today for follow up. He tells me that he has been feeling weak. He has occasional dizziness. His left sided weakness has improved. He has had no chest pains. His breathing has been ok.    Primary Care Physician: [name]   Past Medical History  Diagnosis Date  . CAD (coronary artery disease)     cath 10/11 with mild LAD, 80% mid circ, PLA 70%. no s/p promus DES circumflex artery 10/13/10  . GERD (gastroesophageal reflux disease)   . HLD (hyperlipidemia)   . HTN (hypertension)   . Tobacco abuse   . CVA (cerebral vascular accident) 3/11  . TIA (transient ischemic attack) 3/11  . Hematuria 8/12  . Sleep apnea     states he does not use CPAP  . Arthritis   . PFO (patent foramen ovale)     echo 8/12: EF 55-65%, grade 2 diast dysfnx; +PFO on bubble study;  TEE 4/13: EF normal, atrial septum with suspicion for interatrial septum fenestrations without flow across and few large bubbles noted in LA.  This was not felt to require closure  Past Surgical History  Procedure Date  . Knee arthroscopy     2 on left and 3 on rt  . Coronary angioplasty with stent placement   . Tee without cardioversion 03/21/2012    Procedure: TRANSESOPHAGEAL ECHOCARDIOGRAM (TEE);  Surgeon: Pricilla Riffle, MD;  [facility] ENDOSCOPY;  Service: Cardiovascular;  Laterality: N/A;    Current Outpatient Prescriptions  Medication Sig Dispense Refill  . amLODipine (NORVASC) 10 MG tablet Take 1 tablet (10 mg total) by mouth daily.  30 tablet  3  . aspirin EC 81 MG EC tablet Take 1 tablet (81 mg total) by mouth daily.      Marland Kitchen atorvastatin (LIPITOR)  80 MG tablet Take 1 tablet (80 mg total) by mouth at bedtime.  90 tablet  2  . losartan (COZAAR) 100 MG tablet Take 100 mg by mouth daily.        . nitroGLYCERIN (NITROSTAT) 0.4 MG SL tablet Place 1 tablet (0.4 mg total) under the tongue every 5 (five) minutes x 3 doses as needed for chest pain.  25 tablet  3  . warfarin (COUMADIN) 5 MG tablet Take 1.5 tablets (7.5 mg total) by mouth daily.  45 tablet  1    Allergies  Allergen Reactions  . Iodine     REACTION: anaphylaxis    History   Social History  . Marital Status: Married    Spouse Name: N/A    Number of Children: N/A  . Years of Education: N/A   Occupational History  . Not on file.   Social History Main Topics  . Smoking status: Former Smoker -- 1.0 packs/day for 26 years    Types: Cigarettes    Quit date: 03/10/2012  . Smokeless tobacco: Never Used   Comment: 1/2 ppd x 35 years  Has already quit smoking  . Alcohol Use: No  . Drug Use: No  . Sexually Active: Not Currently   Other Topics Concern  . Not on file   Social History Narrative   Married, 5 kids; lives in [place]. 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     Family History  Problem Relation Age of Onset  . Cancer Neg Hx   . Kidney disease Neg Hx   . Diabetes Neg Hx     Review of Systems:  As stated in the HPI and otherwise negative.   BP 180/90  Pulse 76  Ht 5\' 11"  (1.803 m)  Wt 244 lb 12.8 oz (111.041 kg)  BMI 34.14 kg/m2  Physical Examination: General: Well developed, well nourished, NAD HEENT: OP clear, mucus membranes moist SKIN: warm, dry. No rashes. Neuro: No focal deficits Musculoskeletal: Muscle strength 5/5 all ext Psychiatric: Mood and affect normal Neck: No JVD, no carotid bruits, no thyromegaly, no lymphadenopathy. Lungs:Clear bilaterally, no wheezes, rhonci, crackles Cardiovascular: Regular rate and rhythm. No murmurs, gallops or rubs. Abdomen:Soft. Bowel sounds present. Non-tender.  Extremities: No lower  extremity edema. Pulses are 2 + in the bilateral DP/PT.

## 2012-05-20 NOTE — Telephone Encounter (Signed)
RECEIVED A CALL FROM PAT.  DR. Clifton James ADMITTED PT TO [facility] 03/19/12.   PT WAS SCHEDULED TO SEE DR. Yetta Barre PCP @ [facility] ON 03/19/12. PT RECEIVED NO SHOW CHARGE DUE TO BEING IN THE HOSPITAL. PAT WAS REQUESTING [place] TO CALL SOMEONE IN BILLING AND TAKE CARE OF NO SHOW CHARGE FOR HIM.  I SPOKE WITH STACEY [phone] AT [facility] PROVIDER BILLING AND SHE IS PUTTING IN A REQUEST TO HAVE THIS FEE WAVED FOR HIM.   I LET PAT KNOW HER CONTACT #,NAME AND EXTENSION IF THE PATIENT NEEDED IT FOR A REFERENCE.

## 2012-05-20 NOTE — Assessment & Plan Note (Signed)
No evidence of significant PFO on TEE. He is on coumadin for small fenestrations in atrial septum and neurological changes. Recommend long term anti-coagulation.

## 2012-05-20 NOTE — Assessment & Plan Note (Signed)
Will continue Cozaar, Norvasc and add Coreg.

## 2012-05-20 NOTE — Patient Instructions (Addendum)
Your physician wants you to follow-up in: 6 months.  You will receive a reminder letter in the mail two months in advance. If you don't receive a letter, please call our office to schedule the follow-up appointment.  Your physician has recommended you make the following change in your medication: Start Coreg 6.25 mg by mouth twice daily.  Start Protonix 40 mg by mouth daily

## 2012-05-20 NOTE — Assessment & Plan Note (Signed)
Will continue coumadin. This is also used for his neurological changes with possible very small PFO.

## 2012-05-20 NOTE — Assessment & Plan Note (Signed)
Stable. Will continue ASA, statin. Will add beta blocker. Check lipids and LFTs today.

## 2012-05-21 ENCOUNTER — Other Ambulatory Visit (HOSPITAL_COMMUNITY): Payer: Self-pay | Admitting: Physician Assistant

## 2012-05-28 ENCOUNTER — Other Ambulatory Visit: Payer: Self-pay | Admitting: *Deleted

## 2012-05-28 DIAGNOSIS — E876 Hypokalemia: Secondary | ICD-10-CM

## 2012-05-28 MED ORDER — POTASSIUM CHLORIDE CRYS ER 20 MEQ PO TBCR: 20.0000 meq | EXTENDED_RELEASE_TABLET | Freq: Every day | ORAL | Status: DC

## 2012-06-10 ENCOUNTER — Ambulatory Visit (INDEPENDENT_AMBULATORY_CARE_PROVIDER_SITE_OTHER): Payer: BC Managed Care – PPO | Admitting: *Deleted

## 2012-06-10 ENCOUNTER — Other Ambulatory Visit: Payer: Self-pay | Admitting: *Deleted

## 2012-06-10 ENCOUNTER — Other Ambulatory Visit (INDEPENDENT_AMBULATORY_CARE_PROVIDER_SITE_OTHER): Payer: BC Managed Care – PPO

## 2012-06-10 DIAGNOSIS — E876 Hypokalemia: Secondary | ICD-10-CM

## 2012-06-10 DIAGNOSIS — Z7901 Long term (current) use of anticoagulants: Secondary | ICD-10-CM

## 2012-06-10 DIAGNOSIS — I6350 Cerebral infarction due to unspecified occlusion or stenosis of unspecified cerebral artery: Secondary | ICD-10-CM

## 2012-06-10 DIAGNOSIS — I82409 Acute embolism and thrombosis of unspecified deep veins of unspecified lower extremity: Secondary | ICD-10-CM

## 2012-06-10 LAB — BASIC METABOLIC PANEL
BUN: 22 mg/dL (ref 6–23)
Chloride: 106 mEq/L (ref 96–112)
GFR: 103.53 mL/min (ref >60.00)
Potassium: 2.9 mEq/L — ABNORMAL LOW (ref 3.5–5.1)
Sodium: 143 mEq/L (ref 135–145)

## 2012-06-13 ENCOUNTER — Other Ambulatory Visit (INDEPENDENT_AMBULATORY_CARE_PROVIDER_SITE_OTHER): Payer: BC Managed Care – PPO

## 2012-06-13 ENCOUNTER — Other Ambulatory Visit: Payer: Self-pay | Admitting: *Deleted

## 2012-06-13 DIAGNOSIS — E876 Hypokalemia: Secondary | ICD-10-CM

## 2012-06-13 LAB — BASIC METABOLIC PANEL
CO2: 29 mEq/L (ref 19–32)
Calcium: 9.2 mg/dL (ref 8.4–10.5)
Chloride: 107 mEq/L (ref 96–112)
Glucose, Bld: 118 mg/dL — ABNORMAL HIGH (ref 70–99)
Potassium: 3.4 mEq/L — ABNORMAL LOW (ref 3.5–5.1)
Sodium: 143 mEq/L (ref 135–145)

## 2012-06-13 MED ORDER — POTASSIUM CHLORIDE CRYS ER 20 MEQ PO TBCR: 40.0000 meq | EXTENDED_RELEASE_TABLET | Freq: Two times a day (BID) | ORAL | Status: DC

## 2012-06-20 ENCOUNTER — Ambulatory Visit (INDEPENDENT_AMBULATORY_CARE_PROVIDER_SITE_OTHER): Payer: BC Managed Care – PPO | Admitting: *Deleted

## 2012-06-20 ENCOUNTER — Other Ambulatory Visit (INDEPENDENT_AMBULATORY_CARE_PROVIDER_SITE_OTHER): Payer: BC Managed Care – PPO

## 2012-06-20 DIAGNOSIS — E876 Hypokalemia: Secondary | ICD-10-CM

## 2012-06-20 DIAGNOSIS — I6350 Cerebral infarction due to unspecified occlusion or stenosis of unspecified cerebral artery: Secondary | ICD-10-CM

## 2012-06-20 DIAGNOSIS — I82409 Acute embolism and thrombosis of unspecified deep veins of unspecified lower extremity: Secondary | ICD-10-CM

## 2012-06-20 DIAGNOSIS — Z7901 Long term (current) use of anticoagulants: Secondary | ICD-10-CM

## 2012-06-20 LAB — BASIC METABOLIC PANEL
BUN: 18 mg/dL (ref 6–23)
CO2: 27 mEq/L (ref 19–32)
Chloride: 107 mEq/L (ref 96–112)
GFR: 111.26 mL/min (ref >60.00)
Glucose, Bld: 124 mg/dL — ABNORMAL HIGH (ref 70–99)
Potassium: 3.7 mEq/L (ref 3.5–5.1)
Sodium: 141 mEq/L (ref 135–145)

## 2012-06-21 ENCOUNTER — Telehealth: Payer: Self-pay | Admitting: Physician Assistant

## 2012-06-21 NOTE — Telephone Encounter (Signed)
[name]. has a hx of   At 3 am today, legs started cramping (R>L) in calves.  Pain radiating down to ankle. INR yesterday therapeutic. No injuries.  No exertional leg pain. Notes LE edema x 1-2 weeks.  Both legs swollen. No fever, chills. No rubor, tumor.  No open lesions. No back problems. No chest pain, dyspnea.   K+ on labs yesterday 3.7.  I have recommended he go to urgent care today for further evaluation. He agrees. Tereso Newcomer, PA-C  9:28 AM 06/21/2012

## 2012-07-04 ENCOUNTER — Ambulatory Visit (INDEPENDENT_AMBULATORY_CARE_PROVIDER_SITE_OTHER): Payer: BC Managed Care – PPO | Admitting: Pharmacist

## 2012-07-04 ENCOUNTER — Other Ambulatory Visit: Payer: Self-pay | Admitting: Cardiovascular Disease

## 2012-07-04 DIAGNOSIS — I82409 Acute embolism and thrombosis of unspecified deep veins of unspecified lower extremity: Secondary | ICD-10-CM

## 2012-07-04 DIAGNOSIS — I6350 Cerebral infarction due to unspecified occlusion or stenosis of unspecified cerebral artery: Secondary | ICD-10-CM

## 2012-07-04 DIAGNOSIS — Z7901 Long term (current) use of anticoagulants: Secondary | ICD-10-CM

## 2012-07-04 NOTE — Progress Notes (Signed)
Note regarding pt having "bad" leg cramps sent to [facility] and Dr Clifton James for pt to be advised.

## 2012-07-09 ENCOUNTER — Telehealth: Payer: Self-pay | Admitting: *Deleted

## 2012-07-09 DIAGNOSIS — R600 Localized edema: Secondary | ICD-10-CM

## 2012-07-09 NOTE — Telephone Encounter (Signed)
During coumadin clinic visit today 7/26 the pt reports "bad" leg cramps that started 2 weeks ago. He would like to be advised. He is aware that Dennie Bible and Dr Clifton James are out of the office today   Above copied from staff message. Per phone note dated June 21, 2012 pt was advised by Tereso Newcomer ,PA to go to urgent care to have leg cramps evaluated. I called pt to check on him. He reports he continues to have leg cramps at times and swelling that started 3-4 weeks ago in legs. He did not go to urgent care to be evaluated.  I instructed him he should go to urgent care as previously instructed and that I would send message to Dr. Clifton James for any further recommendations.

## 2012-07-13 NOTE — Telephone Encounter (Signed)
Tony Davis, I sent you a note in response to the coumadin clinic message to me on [name]. Can we f/u with him by phone and see if he had evaluation by Urgent care or primary care. If not, can we work him in to see Scott/chris/Lori/Michelle?   We should also set up ABI to make sure it is not PAD.

## 2012-07-15 NOTE — Telephone Encounter (Signed)
Due to swelling in legs Dr. Clifton James would like to change study to venous dopplers. It can be determined at office visit on July 16, 2012 with Jacolyn Reedy if he needs ABI's.  Appt for venous dopplers scheduled for July 16, 2012 at 10:00.  I called pt to review this information and left message to call back.

## 2012-07-15 NOTE — Telephone Encounter (Signed)
Spoke with pt who reports legs continue to cramp at times. He reports swelling in both lower legs that is worse at the end of the day.  He has not seen Urgent Care. Appt made for him to see Jacolyn Reedy, PA on July 16, 2012 at 11:30. ABI's to be arranged at this appt.

## 2012-07-15 NOTE — Telephone Encounter (Signed)
Left message to call back  

## 2012-07-15 NOTE — Telephone Encounter (Signed)
Pt notified and he will be here for doppler study and office visit tomorrow.

## 2012-07-16 ENCOUNTER — Ambulatory Visit (INDEPENDENT_AMBULATORY_CARE_PROVIDER_SITE_OTHER): Payer: BC Managed Care – PPO | Admitting: Physician Assistant

## 2012-07-16 ENCOUNTER — Encounter (INDEPENDENT_AMBULATORY_CARE_PROVIDER_SITE_OTHER): Payer: BC Managed Care – PPO

## 2012-07-16 ENCOUNTER — Encounter: Payer: Self-pay | Admitting: Physician Assistant

## 2012-07-16 VITALS — BP 138/90 | HR 83 | Ht 71.0 in | Wt 248.4 lb

## 2012-07-16 DIAGNOSIS — I10 Essential (primary) hypertension: Secondary | ICD-10-CM

## 2012-07-16 DIAGNOSIS — R609 Edema, unspecified: Secondary | ICD-10-CM

## 2012-07-16 DIAGNOSIS — I2510 Atherosclerotic heart disease of native coronary artery without angina pectoris: Secondary | ICD-10-CM

## 2012-07-16 DIAGNOSIS — I87009 Postthrombotic syndrome without complications of unspecified extremity: Secondary | ICD-10-CM

## 2012-07-16 DIAGNOSIS — R600 Localized edema: Secondary | ICD-10-CM

## 2012-07-16 DIAGNOSIS — Q211 Atrial septal defect: Secondary | ICD-10-CM

## 2012-07-16 LAB — BASIC METABOLIC PANEL
CO2: 28 mEq/L (ref 19–32)
Chloride: 104 mEq/L (ref 96–112)
Glucose, Bld: 86 mg/dL (ref 70–99)
Potassium: 3.4 mEq/L — ABNORMAL LOW (ref 3.5–5.1)
Sodium: 142 mEq/L (ref 135–145)

## 2012-07-16 MED ORDER — AMLODIPINE BESYLATE 5 MG PO TABS: 5.0000 mg | ORAL_TABLET | Freq: Every day | ORAL | Status: DC

## 2012-07-16 MED ORDER — CARVEDILOL 12.5 MG PO TABS: 12.5000 mg | ORAL_TABLET | Freq: Two times a day (BID) | ORAL | Status: DC

## 2012-07-16 NOTE — Progress Notes (Signed)
HPI:  This is a 51 year old African American male patient of Dr. Sanjuana Kava,  Who has been complaining about significant leg cramps and leg swelling and has had hypokalemia. His potassium was replaced and last potassium on 7/12 was 3.7. He had venous Dopplers performed today and preliminary shows no evidence of lower extremity deeper superficial venous thrombus or incompetence bilaterally there is no residual thrombus noted in the right proximal SFV. There was an avascular anechoic structure noted in the left popliteal area measuring 2.0 cm x 1.1 cm. This was felt to possibly be a Baker cyst.  The patient has a history of coronary artery disease status post PCI of the circumflex with a prominence drug-eluting stent in 10/13/10. Echo on 03/14/12 in [place] showed normal LV size and function with PFO with positive bubble study. He was started on Coumadin and Lovenox and left AMA from the hospital there. He had a TEE done in 03/21/12 ejection fraction was normal atrium septum was suspicious for anterior atrial septum administration is without flow across and few large bubbles noted in the left atrium. This was not felt to require closure. He has been maintained on Coumadin.  The patient complains today of significant leg cramping and leg swelling. He denies excessive sodium intake but frequents [employer] and [employer]. He says his leg cramps waken him at 3 AM and he can get back to sleep. He says the swelling does not improve with keeping his legs elevated.  Allergies:  -- Iodine    --  REACTION: anaphylaxis  Current Outpatient Prescriptions on File Prior to Visit: aspirin EC 81 MG EC tablet, Take 1 tablet (81 mg total) by mouth daily., Disp: , Rfl:  atorvastatin (LIPITOR) 80 MG tablet, Take 1 tablet (80 mg total) by mouth at bedtime., Disp: 90 tablet, Rfl: 2 losartan (COZAAR) 100 MG tablet, Take 100 mg by mouth daily.  , Disp: , Rfl:  nitroGLYCERIN (NITROSTAT) 0.4 MG SL tablet, Place 1 tablet (0.4 mg  total) under the tongue every 5 (five) minutes x 3 doses as needed for chest pain., Disp: 25 tablet, Rfl: 3 pantoprazole (PROTONIX) 40 MG tablet, Take 1 tablet (40 mg total) by mouth daily., Disp: 30 tablet, Rfl: 11 potassium chloride SA (K-DUR,KLOR-CON) 20 MEQ tablet, Take 2 tablets (40 mEq total) by mouth 2 (two) times daily., Disp: 120 tablet, Rfl: 6 warfarin (COUMADIN) 7.5 MG tablet, TAKE 1 TABLET BY MOUTH DAILY, Disp: 30 tablet, Rfl: 1 DISCONTD: amLODipine (NORVASC) 10 MG tablet, Take 1 tablet (10 mg total) by mouth daily., Disp: 30 tablet, Rfl: 3 DISCONTD: carvedilol (COREG) 6.25 MG tablet, Take 1 tablet (6.25 mg total) by mouth 2 (two) times daily., Disp: 60 tablet, Rfl: 11 DISCONTD: losartan (COZAAR) 100 MG tablet, TAKE 1 TABLET BY MOUTH DAILY, Disp: 30 tablet, Rfl: 4    Past Medical History:   CAD (coronary artery disease)                                  Comment:cath 10/11 with mild LAD, 80% mid circ, PLA               70%. no s/p promus DES circumflex artery               10/13/10   GERD (gastroesophageal reflux disease)                       HLD (hyperlipidemia)  HTN (hypertension)                                           Tobacco abuse                                                CVA (cerebral vascular accident)                3/11         TIA (transient ischemic attack)                 3/11         Hematuria                                       8/12         Sleep apnea                                                    Comment:states he does not use CPAP   Arthritis                                                    PFO (patent foramen ovale)                                     Comment:echo 8/12: EF 55-65%, grade 2 diast dysfnx;               +PFO on bubble study;  TEE 4/13: EF normal,               atrial septum with suspicion for interatrial               septum fenestrations without flow across and               few large  bubbles noted in LA.  This was not               felt to require closure   Past Surgical History:   KNEE ARTHROSCOPY                                               Comment:2 on left and 3 on rt   CORONARY ANGIOPLASTY WITH STENT PLACEMENT                    TEE WITHOUT CARDIOVERSION                       03/21/2012      Comment:Procedure: TRANSESOPHAGEAL ECHOCARDIOGRAM               (  TEE);  Surgeon: Pricilla Riffle, MD;  Location:               [facility] ENDOSCOPY;  Service: Cardiovascular;                Laterality: N/A;  Review of patient's family history indicates:   Cancer                         Neg Hx                   Kidney disease                 Neg Hx                   Diabetes                       Neg Hx                   Social History   Marital Status: Married             Spouse Name:                      Years of Education:                 Number of children:             Occupational History   None on file  Social History Main Topics   Smoking Status: Former Smoker                   Packs/Day: 1     Years: 26        Types: Cigarettes     Quit date: 03/10/2012   Smokeless Status: Never Used                       Comment: 1/2 ppd x 35 years  Has already quit smoking   Alcohol Use: No             Drug Use: No             Sexual Activity: Not Currently      Other Topics            Concern   None on file  Social History Narrative   Married, 5 kids; lives in [place]. 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     ROS:see history of present illness   PHYSICAL EXAM: Well-nournished, in no acute distress. Neck: No JVD, HJR, Bruit, or thyroid enlargement  Lungs: No tachypnea, clear without wheezing, rales, or rhonchi  Cardiovascular: RRR, PMI not displaced, positive S4 , no bruit, thrill, or heave.  Abdomen: BS normal. Soft without organomegaly, masses, lesions or tenderness.  Extremities: +1 edema bilaterally,without cyanosis, clubbing. excellent distal  pulses bilateral  SKin: Warm, no lesions or rashes   Musculoskeletal: No deformities  Neuro: no focal signs  BP 138/90  Pulse 83  Ht 5\' 11"  (1.803 m)  Wt 248 lb 6.4 oz (112.674 kg)  BMI 34.64 kg/m2  SpO2 90%

## 2012-07-16 NOTE — Assessment & Plan Note (Signed)
Patient presents today with about a month history of bilateral lower extremity edema and significant leg cramps. Venous Dopplers do not show evidence of clot. He has excellent arterial pulses. There was an avascular anechoic  structure in the left popliteal area that he will follow-up with orthopedist.I will decrease his amlodipine to see if this is causing some of the edema I will increase his Coreg to 12.5 mg b.i.d. For better blood pressure control. He has been instructed on a 2 g sodium diet. Will also check blood work today to make sure his potassium is stable.

## 2012-07-16 NOTE — Assessment & Plan Note (Signed)
Is no evidence of significant PFO on TEE. He is on Coumadin for small fenestrations in the atrial septum and neurological changes. Long-term anticoagulation is recommended.

## 2012-07-16 NOTE — Patient Instructions (Addendum)
Your physician recommends that you schedule a follow-up appointment in: 1-2 weeks with Dr Clifton James Your physician recommends that you have lab work drawn today (BMP) Your physician has recommended you make the following change in your medication: INCREASE Carvedilol to 12.5 mg twice daily  And DECREASE Amlodipine to 5 mg daily        2 Gram Low Sodium Diet A 2 gram sodium diet restricts the amount of sodium in the diet to no more than 2 g or 2000 mg daily. Limiting the amount of sodium is often used to help lower blood pressure. It is important if you have heart, liver, or kidney problems. Many foods contain sodium for flavor and sometimes as a preservative. When the amount of sodium in a diet needs to be low, it is important to know what to look for when choosing foods and drinks. The following includes some information and guidelines to help make it easier for you to adapt to a low sodium diet. QUICK TIPS  Do not add salt to food.   Avoid convenience items and fast food.   Choose unsalted snack foods.   Buy lower sodium products, often labeled as "lower sodium" or "no salt added."   Check food labels to learn how much sodium is in 1 serving.   When eating at a restaurant, ask that your food be prepared with less salt or none, if possible.  READING FOOD LABELS FOR SODIUM INFORMATION The nutrition facts label is a good place to find how much sodium is in foods. Look for products with no more than 500 to 600 mg of sodium per meal and no more than 150 mg per serving. Remember that 2 g = 2000 mg. The food label may also list foods as:  Sodium-free: Less than 5 mg in a serving.   Very low sodium: 35 mg or less in a serving.   Low-sodium: 140 mg or less in a serving.   Light in sodium: 50% less sodium in a serving. For example, if a food that usually has 300 mg of sodium is changed to become light in sodium, it will have 150 mg of sodium.   Reduced sodium: 25% less sodium in a  serving. For example, if a food that usually has 400 mg of sodium is changed to reduced sodium, it will have 300 mg of sodium.  CHOOSING FOODS Grains  Avoid: Salted crackers and snack items. Some cereals, including instant hot cereals. Bread stuffing and biscuit mixes. Seasoned rice or pasta mixes.   Choose: Unsalted snack items. Low-sodium cereals, oats, puffed wheat and rice, shredded wheat. English muffins and bread. Pasta.  Meats  Avoid: Salted, canned, smoked, spiced, pickled meats, including fish and poultry. Bacon, ham, sausage, cold cuts, hot dogs, anchovies.   Choose: Low-sodium canned tuna and salmon. Fresh or frozen meat, poultry, and fish.  Dairy  Avoid: Processed cheese and spreads. Cottage cheese. Buttermilk and condensed milk. Regular cheese.   Choose: Milk. Low-sodium cottage cheese. Yogurt. Sour cream. Low-sodium cheese.  Fruits and Vegetables  Avoid: Regular canned vegetables. Regular canned tomato sauce and paste. Frozen vegetables in sauces. Olives. Rosita Fire. Relishes. Sauerkraut.   Choose: Low-sodium canned vegetables. Low-sodium tomato sauce and paste. Frozen or fresh vegetables. Fresh and frozen fruit.  Condiments  Avoid: Canned and packaged gravies. Worcestershire sauce. Tartar sauce. Barbecue sauce. Soy sauce. Steak sauce. Ketchup. Onion, garlic, and table salt. Meat flavorings and tenderizers.   Choose: Fresh and dried herbs and spices. Low-sodium varieties of  mustard and ketchup. Lemon juice. Tabasco sauce. Horseradish.  SAMPLE 2 GRAM SODIUM MEAL PLAN Breakfast / Sodium (mg)  1 cup low-fat milk / 143 mg   2 slices whole-wheat toast / 270 mg   1 tbs heart-healthy margarine / 153 mg   1 hard-boiled egg / 139 mg   1 small orange / 0 mg  Lunch / Sodium (mg)  1 cup raw carrots / 76 mg    cup hummus / 298 mg   1 cup low-fat milk / 143 mg    cup red grapes / 2 mg   1 whole-wheat pita bread / 356 mg  Dinner / Sodium (mg)  1 cup whole-wheat pasta  / 2 mg   1 cup low-sodium tomato sauce / 73 mg   3 oz lean ground beef / 57 mg   1 small side salad (1 cup raw spinach leaves,  cup cucumber,  cup yellow bell pepper) with 1 tsp olive oil and 1 tsp red wine vinegar / 25 mg  Snack / Sodium (mg)  1 container low-fat vanilla yogurt / 107 mg   3 graham cracker squares / 127 mg  Nutrient Analysis  Calories: 2033   Protein: 77 g   Carbohydrate: 282 g   Fat: 72 g   Sodium: 1971 mg  Document Released: 11/26/2005 Document Revised: 11/15/2011 Document Reviewed: 02/27/2010 [facility] Patient Information 2012 [place], [place].

## 2012-07-16 NOTE — Assessment & Plan Note (Signed)
Blood pressure on the high side. Will increase Coreg.

## 2012-07-17 ENCOUNTER — Telehealth: Payer: Self-pay | Admitting: Cardiovascular Disease

## 2012-07-17 NOTE — Telephone Encounter (Signed)
Patient returning nurse call, he can be reached at [phone]

## 2012-07-17 NOTE — Telephone Encounter (Signed)
Follow-up:    Patient returned your call.  Please call back. 

## 2012-07-17 NOTE — Telephone Encounter (Signed)
Spoke with pt and gave him instructions from Dr. Clifton James to take additional 40 meq KCl today and tomorrow and then return to regular dose.  He verbalizes understanding.

## 2012-07-22 ENCOUNTER — Other Ambulatory Visit (HOSPITAL_COMMUNITY): Payer: Self-pay | Admitting: Physician Assistant

## 2012-07-25 ENCOUNTER — Ambulatory Visit (INDEPENDENT_AMBULATORY_CARE_PROVIDER_SITE_OTHER): Payer: BC Managed Care – PPO | Admitting: *Deleted

## 2012-07-25 DIAGNOSIS — I82409 Acute embolism and thrombosis of unspecified deep veins of unspecified lower extremity: Secondary | ICD-10-CM

## 2012-07-25 DIAGNOSIS — Z7901 Long term (current) use of anticoagulants: Secondary | ICD-10-CM

## 2012-07-25 DIAGNOSIS — I6350 Cerebral infarction due to unspecified occlusion or stenosis of unspecified cerebral artery: Secondary | ICD-10-CM

## 2012-07-25 MED ORDER — WARFARIN SODIUM 7.5 MG PO TABS: ORAL_TABLET | ORAL | Status: DC

## 2012-08-06 ENCOUNTER — Ambulatory Visit (INDEPENDENT_AMBULATORY_CARE_PROVIDER_SITE_OTHER): Payer: BC Managed Care – PPO | Admitting: Cardiovascular Disease

## 2012-08-06 ENCOUNTER — Encounter: Payer: Self-pay | Admitting: Cardiovascular Disease

## 2012-08-06 VITALS — BP 110/69 | HR 87 | Ht 71.0 in | Wt 252.0 lb

## 2012-08-06 DIAGNOSIS — I2510 Atherosclerotic heart disease of native coronary artery without angina pectoris: Secondary | ICD-10-CM

## 2012-08-06 NOTE — Progress Notes (Signed)
History of Present Illness: 51 yo AAM with history of CAD, HTN, hyperlipidemia, impaired fasting glucose and obesity who is here today for cardiac follow up. I saw him in February 2011 for complaints of chest pain. I arranged a stress test, an echo and carotid dopplers at the first visit. He missed his follow up appt. The carotid dopplers were normal. He came in for his stress test and complained of neurological changes. He was seen by Dr. Riley Kill and the stress test was cancelled. He was sent over to Dr. Stones office and had MRI/MRA of the head and neck. MRA showed no occlusive disease. MRI showed non-specific white matter changes but no clear infarct, possibly related to hypertension. His echo shows septal and inferobasal hypokinesis with mild concentric hypertrophy, EF 45-50%. He was seen here in October 2011 and was still having chest pains. I arranged a left heart cath in the outpatient lab. He was found to have a stenosis in the mid Circumflex. He was brought back in 10/13/10 for planned PCI of the Circumflex. A Promus drug eluting stent was placed without any complications. Repeat echo August 2012 with PFO with positive bubble study. I arranged for him to see Dr. Excell Seltzer for PFO closure but he skipped his appointment. He was in [place] April 2013 and had acute onset of left arm and leg weakness. He was admitted there. CT head without acute stroke. Venous dopplers with chronic DVT right femoral vein. Carotids with <50% RICA stenosis, LICA ok. Echo with PFO. Echo 03/14/12 in [place] with normal LV size and function with PFO with positive bubble study. He was started on coumadin and Lovenox and left AMA from the hospital there. He was seen in [facility] ED on 03/17/12 and discharged home. I saw him in our office 03/19/12. At that time, he continued to have left sided weakness. He had not been compliant with plans for Lovenox and coumadin. He was admitted for Heparin and coumadin. TEE was done 03/21/12: EF normal,  atrial septum with suspicion for interatrial septum fenestrations without flow across and few large bubbles noted in LA. This was not felt to require closure. He has been maintained on coumadin and has been followed in our coumadin clinic. He was seen by Herma Carson, PA-C on 07/16/12. His Coreg was increased but he has not started the higher dose yet. Recent venous dopplers LE with no DVT. Norvasc dose cut in half but he has not done this. This was because of mild lower ext edema.   He is here today for follow up. He tells me that he has been feeling well. No chest pain or SOB.  He has occasional dizziness. His left sided weakness has improved. Tolerating coumadin with no bleeding. Still c/o mild swelling in ankles. He also has sleep apnea but refuses to wear CPAP. He has constant daytime drowsiness.   Primary Care Physician: [name]  Last lipid profile:  Lipid Panel     Component Value Date/Time   CHOL 181 05/20/2012 0922   TRIG 138.0 05/20/2012 0922   HDL 35.90* 05/20/2012 0922   CHOLHDL 5 05/20/2012 0922   VLDL 27.6 05/20/2012 0922   LDLCALC 118* 05/20/2012 0922       Past Medical History  Diagnosis Date  . CAD (coronary artery disease)     cath 10/11 with mild LAD, 80% mid circ, PLA 70%. no s/p promus DES circumflex artery 10/13/10  . GERD (gastroesophageal reflux disease)   . HLD (hyperlipidemia)   .  HTN (hypertension)   . Tobacco abuse   . CVA (cerebral vascular accident) 3/11  . TIA (transient ischemic attack) 3/11  . Hematuria 8/12  . Sleep apnea     states he does not use CPAP  . Arthritis   . PFO (patent foramen ovale)     echo 8/12: EF 55-65%, grade 2 diast dysfnx; +PFO on bubble study;  TEE 4/13: EF normal, atrial septum with suspicion for interatrial septum fenestrations without flow across and few large bubbles noted in LA.  This was not felt to require closure     Past Surgical History  Procedure Date  . Knee arthroscopy     2 on left and 3 on rt  . Coronary  angioplasty with stent placement   . Tee without cardioversion 03/21/2012    Procedure: TRANSESOPHAGEAL ECHOCARDIOGRAM (TEE);  Surgeon: Pricilla Riffle, MD;  Location: [facility] ENDOSCOPY;  Service: Cardiovascular;  Laterality: N/A;    Current Outpatient Prescriptions  Medication Sig Dispense Refill  . amLODipine (NORVASC) 10 MG tablet TAKE 1 TABLET BY MOUTH DAILY  30 tablet  11  . aspirin EC 81 MG EC tablet Take 1 tablet (81 mg total) by mouth daily.      Marland Kitchen atorvastatin (LIPITOR) 80 MG tablet Take 1 tablet (80 mg total) by mouth at bedtime.  90 tablet  2  . carvedilol (COREG) 12.5 MG tablet Take 1 tablet (12.5 mg total) by mouth 2 (two) times daily.  60 tablet  6  . losartan (COZAAR) 100 MG tablet Take 100 mg by mouth daily.        . nitroGLYCERIN (NITROSTAT) 0.4 MG SL tablet Place 1 tablet (0.4 mg total) under the tongue every 5 (five) minutes x 3 doses as needed for chest pain.  25 tablet  3  . pantoprazole (PROTONIX) 40 MG tablet Take 1 tablet (40 mg total) by mouth daily.  30 tablet  11  . potassium chloride SA (K-DUR,KLOR-CON) 20 MEQ tablet Take 2 tablets (40 mEq total) by mouth 2 (two) times daily.  120 tablet  6  . warfarin (COUMADIN) 7.5 MG tablet Take as directed by coumadin clinic  40 tablet  3  . DISCONTD: amLODipine (NORVASC) 5 MG tablet Take 1 tablet (5 mg total) by mouth daily.  30 tablet  3    Allergies  Allergen Reactions  . Iodine     REACTION: anaphylaxis    History   Social History  . Marital Status: Married    Spouse Name: N/A    Number of Children: N/A  . Years of Education: N/A   Occupational History  . Not on file.   Social History Main Topics  . Smoking status: Former Smoker -- 1.0 packs/day for 26 years    Types: Cigarettes    Quit date: 03/10/2012  . Smokeless tobacco: Never Used   Comment: 1/2 ppd x 35 years  Has already quit smoking  . Alcohol Use: No  . Drug Use: No  . Sexually Active: Not Currently   Other Topics Concern  . Not on file   Social  History Narrative   Married, 5 kids; lives in [place]. 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     Family History  Problem Relation Age of Onset  . Cancer Neg Hx   . Kidney disease Neg Hx   . Diabetes Neg Hx     Review of Systems:  As stated in the HPI and otherwise negative.  BP 110/69  Pulse 87  Ht 5\' 11"  (1.803 m)  Wt 252 lb (114.306 kg)  BMI 35.15 kg/m2  Physical Examination: General: Well developed, well nourished, NAD HEENT: OP clear, mucus membranes moist SKIN: warm, dry. No rashes. Neuro: No focal deficits Musculoskeletal: Muscle strength 5/5 all ext Psychiatric: Mood and affect normal Neck: No JVD, no carotid bruits, no thyromegaly, no lymphadenopathy. Lungs:Clear bilaterally, no wheezes, rhonci, crackles Cardiovascular: Regular rate and rhythm. No murmurs, gallops or rubs. Abdomen:Soft. Bowel sounds present. Non-tender.  Extremities: No lower extremity edema. Pulses are 2 + in the bilateral DP/PT  Assessment and Plan:   1. CAD, NATIVE VESSEL: Stable. Will continue ASA, statin, beta blocker, ARB.   2. DVT: Will continue coumadin. This is also used for his neurological changes with possible very small PFO.   3. GERD: Improved. cont Protonix 40 mg po QDaily.   4. HYPERTENSION : BP controlled. Will continue Cozaar, Norvasc and Coreg. Norvasc is being reduced because of LE edema but he has not done this yet as asked. Can d/c if his LE continues.   5. PATENT FORAMEN OVALE: No evidence of significant PFO on TEE. He is on coumadin for small fenestrations in atrial septum and neurological changes. Recommend long term anti-coagulation.   6. OSA with daytime somnolence: He has no desire to wear CPAP. He has not been compliant yet cannot understand why he is tired during the day. I have asked him to f/u with Dr. Yetta Barre for further discussion about CPAP masks and possibly referral to Pulmonary for him to have fitting for the right mask.

## 2012-08-06 NOTE — Patient Instructions (Addendum)
Your physician wants you to follow-up in:  6 months. You will receive a reminder letter in the mail two months in advance. If you don't receive a letter, please call our office to schedule the follow-up appointment.   

## 2012-08-22 ENCOUNTER — Ambulatory Visit (INDEPENDENT_AMBULATORY_CARE_PROVIDER_SITE_OTHER): Payer: BC Managed Care – PPO | Admitting: *Deleted

## 2012-08-22 DIAGNOSIS — I82409 Acute embolism and thrombosis of unspecified deep veins of unspecified lower extremity: Secondary | ICD-10-CM

## 2012-08-22 DIAGNOSIS — Z7901 Long term (current) use of anticoagulants: Secondary | ICD-10-CM

## 2012-08-22 DIAGNOSIS — I6350 Cerebral infarction due to unspecified occlusion or stenosis of unspecified cerebral artery: Secondary | ICD-10-CM

## 2012-08-27 ENCOUNTER — Encounter (HOSPITAL_COMMUNITY): Payer: Self-pay | Admitting: Emergency Medicine

## 2012-08-27 DIAGNOSIS — Z8673 Personal history of transient ischemic attack (TIA), and cerebral infarction without residual deficits: Secondary | ICD-10-CM | POA: Insufficient documentation

## 2012-08-27 DIAGNOSIS — I10 Essential (primary) hypertension: Secondary | ICD-10-CM | POA: Insufficient documentation

## 2012-08-27 DIAGNOSIS — Z87891 Personal history of nicotine dependence: Secondary | ICD-10-CM | POA: Insufficient documentation

## 2012-08-27 DIAGNOSIS — I2510 Atherosclerotic heart disease of native coronary artery without angina pectoris: Secondary | ICD-10-CM | POA: Insufficient documentation

## 2012-08-27 DIAGNOSIS — M7989 Other specified soft tissue disorders: Secondary | ICD-10-CM | POA: Insufficient documentation

## 2012-08-27 DIAGNOSIS — Z7982 Long term (current) use of aspirin: Secondary | ICD-10-CM | POA: Insufficient documentation

## 2012-08-27 DIAGNOSIS — Z7901 Long term (current) use of anticoagulants: Secondary | ICD-10-CM | POA: Insufficient documentation

## 2012-08-27 DIAGNOSIS — Z79899 Other long term (current) drug therapy: Secondary | ICD-10-CM | POA: Insufficient documentation

## 2012-08-27 DIAGNOSIS — Z9109 Other allergy status, other than to drugs and biological substances: Secondary | ICD-10-CM | POA: Insufficient documentation

## 2012-08-27 DIAGNOSIS — Z86718 Personal history of other venous thrombosis and embolism: Secondary | ICD-10-CM | POA: Insufficient documentation

## 2012-08-27 DIAGNOSIS — E876 Hypokalemia: Secondary | ICD-10-CM | POA: Insufficient documentation

## 2012-08-27 DIAGNOSIS — E785 Hyperlipidemia, unspecified: Secondary | ICD-10-CM | POA: Insufficient documentation

## 2012-08-27 NOTE — ED Notes (Signed)
Pt reports having bil ankle/leg swelling since yesterday--reports feeling "tight"; denies CP, SOB; denies injury + DP

## 2012-08-28 ENCOUNTER — Emergency Department (HOSPITAL_COMMUNITY)
Admission: EM | Admit: 2012-08-28 | Discharge: 2012-08-28 | Disposition: A | Discharge status: Home / Self Care | Payer: BC Managed Care – PPO | Attending: Emergency Medicine | Admitting: Emergency Medicine

## 2012-08-28 ENCOUNTER — Emergency Department (HOSPITAL_COMMUNITY): Payer: BC Managed Care – PPO

## 2012-08-28 DIAGNOSIS — R609 Edema, unspecified: Secondary | ICD-10-CM

## 2012-08-28 HISTORY — DX: Acute embolism and thrombosis of unspecified deep veins of unspecified lower extremity: I82.409

## 2012-08-28 LAB — CBC
HCT: 37.2 % — ABNORMAL LOW (ref 39.0–52.0)
MCV: 87.9 fL (ref 78.0–100.0)
RBC: 4.23 MIL/uL (ref 4.22–5.81)
WBC: 7.7 10*3/uL (ref 4.0–10.5)

## 2012-08-28 LAB — POCT I-STAT, CHEM 8
BUN: 16 mg/dL (ref 6–23)
Calcium, Ion: 1.22 mmol/L (ref 1.12–1.23)
Chloride: 106 mEq/L (ref 96–112)
Glucose, Bld: 138 mg/dL — ABNORMAL HIGH (ref 70–99)

## 2012-08-28 MED ORDER — POTASSIUM CHLORIDE CRYS ER 20 MEQ PO TBCR
20.0000 meq | EXTENDED_RELEASE_TABLET | Freq: Once | ORAL | Status: AC
  Administered 2012-08-28: 20 meq via ORAL
  Filled 2012-08-28: qty 1

## 2012-08-28 MED ORDER — OXYCODONE-ACETAMINOPHEN 5-325 MG PO TABS: 1.0000 | ORAL_TABLET | Freq: Four times a day (QID) | ORAL | Status: DC | PRN

## 2012-08-28 NOTE — ED Provider Notes (Signed)
History     CSN: [id number]  Arrival date & time 08/27/12  2326   First MD Initiated Contact with Patient 08/28/12 0031      Chief Complaint  Patient presents with  . Leg Swelling    (Consider location/radiation/quality/duration/timing/severity/associated sxs/prior treatment) HPI Hx per PT, ankle swelling x 24 hours, getting worse today. Feels tight and uncomfortable.  No rash or increased warmth. Swelling is same in both legs. No trauma. Has h/o DVT on coumadin. No associated CP, SOB or DOE.  HAs h/o similar swelling in the past that resolved after a few days - he did not seek treatment at that time. Mod in severity. No known agrevating or alleviating factors.    Past Medical History  Diagnosis Date  . GERD (gastroesophageal reflux disease)   . HLD (hyperlipidemia)   . HTN (hypertension)   . Tobacco abuse   . CVA (cerebral vascular accident) 3/11  . TIA (transient ischemic attack) 3/11  . Hematuria 8/12  . Sleep apnea     states he does not use CPAP  . Arthritis   . PFO (patent foramen ovale)     echo 8/12: EF 55-65%, grade 2 diast dysfnx; +PFO on bubble study;  TEE 4/13: EF normal, atrial septum with suspicion for interatrial septum fenestrations without flow across and few large bubbles noted in LA.  This was not felt to require closure   . CAD (coronary artery disease)     cath 10/11 with mild LAD, 80% mid circ, PLA 70%. no s/p promus DES circumflex artery 10/13/10  . DVT (deep venous thrombosis)     Past Surgical History  Procedure Date  . Knee arthroscopy     2 on left and 3 on rt  . Coronary angioplasty with stent placement   . Tee without cardioversion 03/21/2012    Procedure: TRANSESOPHAGEAL ECHOCARDIOGRAM (TEE);  Surgeon: Pricilla Riffle, MD;  Location: [facility] ENDOSCOPY;  Service: Cardiovascular;  Laterality: N/A;    Family History  Problem Relation Age of Onset  . Cancer Neg Hx   . Kidney disease Neg Hx   . Diabetes Neg Hx     History  Substance Use Topics    . Smoking status: Former Smoker -- 1.0 packs/day for 26 years    Types: Cigarettes    Quit date: 03/10/2012  . Smokeless tobacco: Never Used   Comment: 1/2 ppd x 35 years  Has already quit smoking  . Alcohol Use: No      Review of Systems  Constitutional: Negative for fever and chills.  HENT: Negative for neck pain and neck stiffness.   Eyes: Negative for pain.  Respiratory: Negative for shortness of breath.   Cardiovascular: Negative for chest pain.  Gastrointestinal: Negative for vomiting and abdominal pain.  Genitourinary: Negative for dysuria.  Musculoskeletal: Negative for back pain.  Skin: Negative for rash and wound.  Neurological: Negative for headaches.  All other systems reviewed and are negative.    Allergies  Iodine  Home Medications   Current Outpatient Rx  Name Route Sig Dispense Refill  . AMLODIPINE BESYLATE 10 MG PO TABS Oral Take 10 mg by mouth daily.    . ASPIRIN 81 MG PO TBEC Oral Take 1 tablet (81 mg total) by mouth daily.    . ATORVASTATIN CALCIUM 80 MG PO TABS Oral Take 1 tablet (80 mg total) by mouth at bedtime. 90 tablet 2  . CARVEDILOL 12.5 MG PO TABS Oral Take 1 tablet (12.5 mg total) by mouth  2 (two) times daily. 60 tablet 6  . LOSARTAN POTASSIUM 100 MG PO TABS Oral Take 100 mg by mouth daily.      Marland Kitchen PANTOPRAZOLE SODIUM 40 MG PO TBEC Oral Take 1 tablet (40 mg total) by mouth daily. 30 tablet 11  . POTASSIUM CHLORIDE CRYS ER 20 MEQ PO TBCR Oral Take 2 tablets (40 mEq total) by mouth 2 (two) times daily. 120 tablet 6  . WARFARIN SODIUM 7.5 MG PO TABS Oral Take 7.5-11.25 mg by mouth daily. 7.5 mg on all days except Tuesday. Patient takes 11.25 mg on Tuesdays.    Marland Kitchen NITROGLYCERIN 0.4 MG SL SUBL Sublingual Place 1 tablet (0.4 mg total) under the tongue every 5 (five) minutes x 3 doses as needed for chest pain. 25 tablet 3    BP 116/63  Pulse 81  Temp 97.7 F (36.5 C)  Resp 20  SpO2 99%  Physical Exam  Constitutional: He is oriented to person,  place, and time. He appears well-developed and well-nourished.  HENT:  Head: Normocephalic and atraumatic.  Eyes: Conjunctivae normal and EOM are normal. Pupils are equal, round, and reactive to light.  Neck: Trachea normal. Neck supple. No JVD present.  Cardiovascular: Normal rate, regular rhythm, S1 normal, S2 normal and normal pulses.     No systolic murmur is present   No diastolic murmur is present  Pulses:      Radial pulses are 2+ on the right side, and 2+ on the left side.  Pulmonary/Chest: Effort normal and breath sounds normal. He has no wheezes. He has no rhonchi. He has no rales. He exhibits no tenderness.  Abdominal: Soft. Normal appearance and bowel sounds are normal. There is no tenderness. There is no CVA tenderness and negative Murphy's sign.  Musculoskeletal:       BLE's Symetric 2 plus pitting edema to mid calves. Calves nontender, no cords or erythema, negative Homans sign  Neurological: He is alert and oriented to person, place, and time. He has normal strength. No cranial nerve deficit or sensory deficit. GCS eye subscore is 4. GCS verbal subscore is 5. GCS motor subscore is 6.  Skin: Skin is warm and dry. No rash noted. He is not diaphoretic.  Psychiatric: His speech is normal.       Cooperative and appropriate    ED Course  Procedures (including critical care time)  Pulse ox 99% RA is adequate  Results for orders placed during the hospital encounter of 08/28/12  CBC      Component Value Range   WBC 7.7  4.0 - 10.5 K/uL   RBC 4.23  4.22 - 5.81 MIL/uL   Hemoglobin 12.5 (*) 13.0 - 17.0 g/dL   HCT 16.1 (*) 09.6 - 04.5 %   MCV 87.9  78.0 - 100.0 fL   MCH 29.6  26.0 - 34.0 pg   MCHC 33.6  30.0 - 36.0 g/dL   RDW 40.9  81.1 - 91.4 %   Platelets 284  150 - 400 K/uL  POCT I-STAT, CHEM 8      Component Value Range   Sodium 144  135 - 145 mEq/L   Potassium 3.4 (*) 3.5 - 5.1 mEq/L   Chloride 106  96 - 112 mEq/L   BUN 16  6 - 23 mg/dL   Creatinine, Ser 7.82  0.50  - 1.35 mg/dL   Glucose, Bld 956 (*) 70 - 99 mg/dL   Calcium, Ion 2.13  0.86 - 1.23 mmol/L   TCO2 24  0 - 100 mmol/L   Hemoglobin 12.2 (*) 13.0 - 17.0 g/dL   HCT 56.2 (*) 13.0 - 86.5 %   Dg Chest 2 View  08/28/2012  *RADIOLOGY REPORT*  Clinical Data: Lower extremity swelling.  CHEST - 2 VIEW  Comparison: Single view of the chest 07/29/2008.  Findings: Lungs are clear.  Heart size normal.  No pneumothorax or pleural effusion.  IMPRESSION: No acute disease.   Original Report Authenticated By: Bernadene Bell. Maricela Curet, M.D.     Peripheral Edema without ANY SOB, DOE, CP or cardiac symptoms otherwise. CXR reviewed as above - no pulm edema on imaging or by exam. Labs reviewed as above crt WNL.  Potassium provided for mild hypokalemia.    Old records reviewed had ECHO 8/12 with EF 55-65%.  PT also treated for hypokalemia.  PT requesting percocet for discomfort. RX provided with plan close follow up in clinic for recheck and further evaluation.  Reliable historian agrees to strict return precautions.  MDM   VS, nursing notes and old records reviewed.   CXR and labs reviewed as above.        Sunnie Nielsen, MD 08/28/12 [phone]

## 2012-08-29 ENCOUNTER — Telehealth: Payer: Self-pay | Admitting: Cardiovascular Disease

## 2012-08-29 NOTE — Telephone Encounter (Signed)
Spoke with Dr.Jordan and instructions given for pt to stop Norvasc. I spoke with pt and gave him this information. I told him to go to Urgent Care or ED if he had continued problems over weekend.

## 2012-08-29 NOTE — Telephone Encounter (Signed)
New problem:  C/O both feet are swollen.  Recently discharge from hospital.

## 2012-08-29 NOTE — Telephone Encounter (Signed)
Spoke with pt who reports increase in swelling in legs/feet for last 2 weeks. States was seen in ED for this on Sept 18 and sent home on Sept 19. No medication changes made at that time per pt report. He states no change in swelling and that feet hurt so bad he can barely walk. No chest pain or shortness of breath. I read all cardiac medications on his list to him and he states he is taking them all.  States he was told in ED to contact cardiologist for problems with swelling.  Will review with Dr. [place]

## 2012-09-01 NOTE — Telephone Encounter (Signed)
Pat, Can we follow up with him this week to see how he is doing? Thanks, chris

## 2012-09-02 NOTE — Telephone Encounter (Signed)
Spoke with pt who reports swelling has greatly improved after stopping norvasc. He will monitor blood pressure and call us if it goes up. He is aware to keep feet elevated when sitting and to limit salt intake.  He will also call Dr. Yetta Barre to re-establish primary care follow up.

## 2012-09-02 NOTE — Telephone Encounter (Signed)
Left message to call back  

## 2012-09-19 ENCOUNTER — Ambulatory Visit (INDEPENDENT_AMBULATORY_CARE_PROVIDER_SITE_OTHER): Payer: BC Managed Care – PPO | Admitting: *Deleted

## 2012-09-19 DIAGNOSIS — I6350 Cerebral infarction due to unspecified occlusion or stenosis of unspecified cerebral artery: Secondary | ICD-10-CM

## 2012-09-19 DIAGNOSIS — I82409 Acute embolism and thrombosis of unspecified deep veins of unspecified lower extremity: Secondary | ICD-10-CM

## 2012-09-19 DIAGNOSIS — Z7901 Long term (current) use of anticoagulants: Secondary | ICD-10-CM

## 2012-09-19 LAB — POCT INR: INR: 3.2

## 2012-09-19 MED ORDER — WARFARIN SODIUM 7.5 MG PO TABS: ORAL_TABLET | ORAL | Status: DC

## 2012-10-01 ENCOUNTER — Ambulatory Visit (INDEPENDENT_AMBULATORY_CARE_PROVIDER_SITE_OTHER): Payer: BC Managed Care – PPO | Admitting: Pharmacist

## 2012-10-01 DIAGNOSIS — Z7901 Long term (current) use of anticoagulants: Secondary | ICD-10-CM

## 2012-10-01 DIAGNOSIS — I82409 Acute embolism and thrombosis of unspecified deep veins of unspecified lower extremity: Secondary | ICD-10-CM

## 2012-10-01 DIAGNOSIS — I6350 Cerebral infarction due to unspecified occlusion or stenosis of unspecified cerebral artery: Secondary | ICD-10-CM

## 2012-10-13 ENCOUNTER — Ambulatory Visit (INDEPENDENT_AMBULATORY_CARE_PROVIDER_SITE_OTHER): Payer: BC Managed Care – PPO | Admitting: Internal Medicine

## 2012-10-13 ENCOUNTER — Other Ambulatory Visit (INDEPENDENT_AMBULATORY_CARE_PROVIDER_SITE_OTHER): Payer: BC Managed Care – PPO

## 2012-10-13 ENCOUNTER — Ambulatory Visit (INDEPENDENT_AMBULATORY_CARE_PROVIDER_SITE_OTHER)
Admission: RE | Admit: 2012-10-13 | Discharge: 2012-10-13 | Disposition: A | Discharge status: Home / Self Care | Payer: BC Managed Care – PPO | Source: Ambulatory Visit | Attending: Internal Medicine | Admitting: Internal Medicine

## 2012-10-13 ENCOUNTER — Encounter: Payer: Self-pay | Admitting: Internal Medicine

## 2012-10-13 VITALS — BP 138/82 | HR 97 | Temp 97.6°F | Resp 14 | Ht 71.0 in | Wt 256.0 lb

## 2012-10-13 DIAGNOSIS — IMO0001 Reserved for inherently not codable concepts without codable children: Secondary | ICD-10-CM

## 2012-10-13 DIAGNOSIS — D649 Anemia, unspecified: Secondary | ICD-10-CM | POA: Insufficient documentation

## 2012-10-13 DIAGNOSIS — E876 Hypokalemia: Secondary | ICD-10-CM

## 2012-10-13 DIAGNOSIS — Q211 Atrial septal defect: Secondary | ICD-10-CM

## 2012-10-13 DIAGNOSIS — M25569 Pain in unspecified knee: Secondary | ICD-10-CM

## 2012-10-13 DIAGNOSIS — I10 Essential (primary) hypertension: Secondary | ICD-10-CM

## 2012-10-13 DIAGNOSIS — D529 Folate deficiency anemia, unspecified: Secondary | ICD-10-CM | POA: Insufficient documentation

## 2012-10-13 DIAGNOSIS — M1710 Unilateral primary osteoarthritis, unspecified knee: Secondary | ICD-10-CM | POA: Insufficient documentation

## 2012-10-13 DIAGNOSIS — Z23 Encounter for immunization: Secondary | ICD-10-CM

## 2012-10-13 LAB — CBC WITH DIFFERENTIAL/PLATELET
Basophils Relative: 0.5 % (ref 0.0–3.0)
Eosinophils Absolute: 0.3 10*3/uL (ref 0.0–0.7)
Eosinophils Relative: 2.9 % (ref 0.0–5.0)
Hemoglobin: 13.8 g/dL (ref 13.0–17.0)
Lymphocytes Relative: 22.2 % (ref 12.0–46.0)
MCHC: 32.5 g/dL (ref 30.0–36.0)
Neutro Abs: 6.8 10*3/uL (ref 1.4–7.7)
RBC: 4.8 Mil/uL (ref 4.22–5.81)

## 2012-10-13 LAB — IBC PANEL: Saturation Ratios: 18.6 % — ABNORMAL LOW (ref 20.0–50.0)

## 2012-10-13 LAB — HEMOGLOBIN A1C: Hgb A1c MFr Bld: 6.3 % (ref 4.6–6.5)

## 2012-10-13 LAB — VITAMIN B12: Vitamin B-12: 293 pg/mL (ref 211–911)

## 2012-10-13 LAB — FERRITIN: Ferritin: 61.2 ng/mL (ref 22.0–322.0)

## 2012-10-13 LAB — FOLATE: Folate: 5.7 ng/mL — ABNORMAL LOW (ref >5.9)

## 2012-10-13 MED ORDER — OXYCODONE-ACETAMINOPHEN 10-325 MG PO TABS: 1.0000 | ORAL_TABLET | Freq: Three times a day (TID) | ORAL | Status: DC | PRN

## 2012-10-13 MED ORDER — FOLIC ACID 1 MG PO TABS: 1.0000 mg | ORAL_TABLET | Freq: Every day | ORAL | Status: DC

## 2012-10-13 NOTE — Assessment & Plan Note (Signed)
I will check his a1c and will treat if needed 

## 2012-10-13 NOTE — Patient Instructions (Signed)

## 2012-10-13 NOTE — Assessment & Plan Note (Signed)
He will continue percocet for pain, I will check a plain film, he thinks he needs surgery so I have referred him to ortho

## 2012-10-13 NOTE — Assessment & Plan Note (Signed)
Recheck the K+ and Mg++ levels today

## 2012-10-13 NOTE — Assessment & Plan Note (Signed)
I will recheck his CBC and will look at his vitamin levels as well 

## 2012-10-13 NOTE — Assessment & Plan Note (Signed)
Start folate replacement 

## 2012-10-13 NOTE — Progress Notes (Signed)
Subjective:    Patient ID: Tony Davis., male    DOB: 1961/04/03, 51 y.o.   MRN: [id number]  Arthritis Presents for follow-up visit. The disease course has been worsening. The condition has lasted for 2 weeks. He complains of pain. He reports no stiffness, joint swelling or joint warmth. The symptoms have been worsening. Affected locations include the left knee. His pain is at a severity of 6/10. Associated symptoms include pain at night and pain while resting. Pertinent negatives include no diarrhea, dry eyes, dry mouth, dysuria, fatigue, fever, rash, uveitis or weight loss. His past medical history is significant for osteoarthritis. His pertinent risk factors include overuse. Past treatments include an opioid. The treatment provided significant relief. Factors aggravating his arthritis include climbing stairs, sitting and activity. Compliance with prior treatments has been good.      Review of Systems  Constitutional: Negative.  Negative for fever, weight loss and fatigue.  HENT: Negative.   Eyes: Negative.   Respiratory: Positive for apnea. Negative for cough, shortness of breath, wheezing and stridor.   Cardiovascular: Negative for chest pain, palpitations and leg swelling.  Gastrointestinal: Negative for nausea, vomiting, abdominal pain, diarrhea, constipation and blood in stool.  Genitourinary: Negative.  Negative for dysuria.  Musculoskeletal: Positive for arthralgias (both knees) and arthritis. Negative for myalgias, back pain, joint swelling, gait problem and stiffness.  Skin: Negative for color change, pallor, rash and wound.  Neurological: Negative.   Hematological: Negative for adenopathy. Does not bruise/bleed easily.  Psychiatric/Behavioral: Negative.        Objective:   Physical Exam  Vitals reviewed. Constitutional: He is oriented to person, place, and time. He appears well-developed and well-nourished. No distress.  HENT:  Head: Normocephalic and atraumatic.    Mouth/Throat: Oropharynx is clear and moist. No oropharyngeal exudate.  Eyes: Conjunctivae normal are normal. Right eye exhibits no discharge. Left eye exhibits no discharge. No scleral icterus.  Neck: Normal range of motion. Neck supple. No JVD present. No tracheal deviation present. No thyromegaly present.  Cardiovascular: Normal rate, regular rhythm, normal heart sounds and intact distal pulses.  Exam reveals no gallop and no friction rub.   No murmur heard. Pulmonary/Chest: Effort normal and breath sounds normal. No stridor. No respiratory distress. He has no wheezes. He has no rales. He exhibits no tenderness.  Abdominal: Soft. Bowel sounds are normal. He exhibits no distension. There is no tenderness. There is no rebound and no guarding.  Musculoskeletal: Normal range of motion. He exhibits no edema and no tenderness.       Right knee: Normal.       Left knee: Normal. He exhibits normal range of motion, no swelling, no effusion, no ecchymosis, no deformity, no laceration, no erythema, normal alignment, no LCL laxity, normal patellar mobility, no bony tenderness, normal meniscus and no MCL laxity. no tenderness found. No medial joint line and no lateral joint line tenderness noted.  Lymphadenopathy:    He has no cervical adenopathy.  Neurological: He is oriented to person, place, and time.  Skin: Skin is warm and dry. No rash noted. He is not diaphoretic. No erythema. No pallor.  Psychiatric: He has a normal mood and affect. His behavior is normal. Judgment and thought content normal.     Lab Results  Component Value Date   WBC 7.7 08/28/2012   HGB 12.2* 08/28/2012   HCT 36.0* 08/28/2012   PLT 284 08/28/2012   GLUCOSE 138* 08/28/2012   CHOL 181 05/20/2012   TRIG 138.0  05/20/2012   HDL 35.90* 05/20/2012   LDLCALC 118* 05/20/2012   ALT 21 05/20/2012   AST 20 05/20/2012   NA 144 08/28/2012   K 3.4* 08/28/2012   CL 106 08/28/2012   CREATININE 1.10 08/28/2012   BUN 16 08/28/2012   CO2 28  07/16/2012   PSA 0.38 08/06/2011   INR 2.6 10/01/2012   HGBA1C 6.1 08/06/2011       Assessment & Plan:

## 2012-10-13 NOTE — Assessment & Plan Note (Signed)
His BP is well controlled, I will check his lytes and renal function 

## 2012-10-22 ENCOUNTER — Telehealth: Payer: Self-pay

## 2012-10-22 ENCOUNTER — Ambulatory Visit (INDEPENDENT_AMBULATORY_CARE_PROVIDER_SITE_OTHER): Payer: BC Managed Care – PPO | Admitting: *Deleted

## 2012-10-22 DIAGNOSIS — I82409 Acute embolism and thrombosis of unspecified deep veins of unspecified lower extremity: Secondary | ICD-10-CM

## 2012-10-22 DIAGNOSIS — Z7901 Long term (current) use of anticoagulants: Secondary | ICD-10-CM

## 2012-10-22 DIAGNOSIS — I6350 Cerebral infarction due to unspecified occlusion or stenosis of unspecified cerebral artery: Secondary | ICD-10-CM

## 2012-10-22 NOTE — Telephone Encounter (Signed)
Received call from [facility] cardiology stating pt having continued knee pain and would like to check the status of ortho referral. Per Epic notes, referral info sent 10/13/12 and again 10/14/12, [facility] verified receipt and advised that they would contact patient. Patient still has not heard anything from their office so therefore I called to inquire. Spoke with scheduler who stated that MD has to review records.  She also stated that due to pt having multiple surgeries on same area, that they would need office notes from all providers. I called pt back to inform he of conversation with [facility] ortho.  Patient states that due to lack of communication and waiting ten days to get this information that he would like referral sent to a different ortho group. I advised pt that I would speak with our referral coordinators to see what's needed.

## 2012-10-22 NOTE — Telephone Encounter (Signed)
[name] notified and pt has been scheduled at another location.

## 2012-11-27 ENCOUNTER — Other Ambulatory Visit: Payer: Self-pay | Admitting: Cardiovascular Disease

## 2012-12-05 ENCOUNTER — Telehealth: Payer: Self-pay | Admitting: Internal Medicine

## 2012-12-05 NOTE — Telephone Encounter (Signed)
Dr. Yetta Barre ordered this for acute knee pain. If he is still having knee pain he needs to be reevaluated before we give him more percocet. Rene Kocher

## 2012-12-05 NOTE — Telephone Encounter (Signed)
The patient called hoping to get a hard copy of an rx for oxycodone.  His call back number is [phone]

## 2012-12-08 NOTE — Telephone Encounter (Signed)
He needs to be seen

## 2012-12-08 NOTE — Telephone Encounter (Signed)
Pt made an appt on Friday at 3:15

## 2012-12-08 NOTE — Telephone Encounter (Signed)
Please advise 

## 2012-12-12 ENCOUNTER — Other Ambulatory Visit (INDEPENDENT_AMBULATORY_CARE_PROVIDER_SITE_OTHER): Payer: BC Managed Care – PPO

## 2012-12-12 ENCOUNTER — Ambulatory Visit (INDEPENDENT_AMBULATORY_CARE_PROVIDER_SITE_OTHER): Payer: BC Managed Care – PPO | Admitting: Internal Medicine

## 2012-12-12 ENCOUNTER — Encounter: Payer: Self-pay | Admitting: Internal Medicine

## 2012-12-12 VITALS — BP 130/88 | HR 90 | Temp 98.4°F | Resp 16 | Wt 261.0 lb

## 2012-12-12 DIAGNOSIS — E876 Hypokalemia: Secondary | ICD-10-CM

## 2012-12-12 DIAGNOSIS — IMO0002 Reserved for concepts with insufficient information to code with codable children: Secondary | ICD-10-CM

## 2012-12-12 DIAGNOSIS — M25569 Pain in unspecified knee: Secondary | ICD-10-CM

## 2012-12-12 DIAGNOSIS — M1712 Unilateral primary osteoarthritis, left knee: Secondary | ICD-10-CM

## 2012-12-12 DIAGNOSIS — M1710 Unilateral primary osteoarthritis, unspecified knee: Secondary | ICD-10-CM

## 2012-12-12 DIAGNOSIS — I10 Essential (primary) hypertension: Secondary | ICD-10-CM

## 2012-12-12 DIAGNOSIS — IMO0001 Reserved for inherently not codable concepts without codable children: Secondary | ICD-10-CM

## 2012-12-12 LAB — BASIC METABOLIC PANEL
BUN: 18 mg/dL (ref 6–23)
CO2: 33 mEq/L — ABNORMAL HIGH (ref 19–32)
Chloride: 102 mEq/L (ref 96–112)
Creatinine, Ser: 1.1 mg/dL (ref 0.4–1.5)
Glucose, Bld: 112 mg/dL — ABNORMAL HIGH (ref 70–99)

## 2012-12-12 LAB — HM DIABETES FOOT EXAM: HM Diabetic Foot Exam: NORMAL

## 2012-12-12 MED ORDER — OXYCODONE-ACETAMINOPHEN 10-325 MG PO TABS: 1.0000 | ORAL_TABLET | Freq: Three times a day (TID) | ORAL | Status: DC | PRN

## 2012-12-12 NOTE — Progress Notes (Signed)
Subjective:    Patient ID: [name]., male    DOB: 06-Jun-1961, 52 y.o.   MRN: [id number]  Hypertension This is a chronic problem. The current episode started more than 1 year ago. The problem has been gradually improving since onset. The problem is controlled. Pertinent negatives include no anxiety, blurred vision, chest pain, headaches, malaise/fatigue, neck pain, orthopnea, palpitations, peripheral edema, PND, shortness of breath or sweats. Past treatments include beta blockers and angiotensin blockers. Compliance problems include exercise and diet.  Hypertensive end-organ damage includes CVA. Identifiable causes of hypertension include sleep apnea.      Review of Systems  Constitutional: Negative for fever, chills, malaise/fatigue, diaphoresis, activity change, appetite change, fatigue and unexpected weight change.  HENT: Negative.  Negative for neck pain.   Eyes: Negative.  Negative for blurred vision.  Respiratory: Negative for cough, choking, chest tightness, shortness of breath, wheezing and stridor.   Cardiovascular: Negative for chest pain, palpitations, orthopnea, leg swelling and PND.  Gastrointestinal: Negative for nausea, vomiting, abdominal pain, diarrhea, constipation and blood in stool.  Genitourinary: Negative.   Musculoskeletal: Positive for arthralgias (knees). Negative for myalgias, back pain, joint swelling and gait problem.  Skin: Negative.   Neurological: Negative.  Negative for headaches.  Hematological: Negative for adenopathy. Does not bruise/bleed easily.  Psychiatric/Behavioral: Negative.        Objective:   Physical Exam  Vitals reviewed. Constitutional: He is oriented to person, place, and time. He appears well-developed and well-nourished. No distress.  HENT:  Head: Normocephalic and atraumatic.  Mouth/Throat: Oropharynx is clear and moist. No oropharyngeal exudate.  Eyes: Conjunctivae normal are normal. Right eye exhibits no discharge. Left  eye exhibits no discharge. No scleral icterus.  Neck: Normal range of motion. Neck supple. No JVD present. No tracheal deviation present. No thyromegaly present.  Cardiovascular: Normal rate, regular rhythm, normal heart sounds and intact distal pulses.  Exam reveals no gallop and no friction rub.   No murmur heard. Pulmonary/Chest: Effort normal and breath sounds normal. No stridor. No respiratory distress. He has no wheezes. He has no rales. He exhibits no tenderness.  Abdominal: Soft. Bowel sounds are normal. He exhibits no distension and no mass. There is no tenderness. There is no rebound and no guarding.  Musculoskeletal: Normal range of motion. He exhibits no edema and no tenderness.       Right knee: Normal. He exhibits normal range of motion, no swelling, no effusion, no ecchymosis, no deformity, no laceration, no erythema, normal alignment, no LCL laxity, normal patellar mobility and no bony tenderness. no tenderness found.       Left knee: He exhibits deformity (mild crepitance). He exhibits normal range of motion, no swelling, no effusion, no ecchymosis, no laceration, normal alignment, no LCL laxity, normal patellar mobility, no bony tenderness, normal meniscus and no MCL laxity. no tenderness found.  Lymphadenopathy:    He has no cervical adenopathy.  Neurological: He is oriented to person, place, and time.  Skin: Skin is warm and dry. No rash noted. He is not diaphoretic. No erythema. No pallor.  Psychiatric: He has a normal mood and affect. His behavior is normal. Judgment and thought content normal.      Lab Results  Component Value Date   WBC 10.0 10/13/2012   HGB 13.8 10/13/2012   HCT 42.6 10/13/2012   PLT 359.0 10/13/2012   GLUCOSE 138* 08/28/2012   CHOL 181 05/20/2012   TRIG 138.0 05/20/2012   HDL 35.90* 05/20/2012   LDLCALC  118* 05/20/2012   ALT 21 05/20/2012   AST 20 05/20/2012   NA 144 08/28/2012   K 3.4* 08/28/2012   CL 106 08/28/2012   CREATININE 1.10 08/28/2012   BUN 16  08/28/2012   CO2 28 07/16/2012   PSA 0.38 08/06/2011   INR 1.8 10/22/2012   HGBA1C 6.3 10/13/2012   Dg Knee Complete 4 Views Left  10/13/2012  *RADIOLOGY REPORT*  Clinical Data:  left knee pain  LEFT KNEE - COMPLETE 4+ VIEW  Comparison: None.  Findings: Four views of the left knee submitted.  No acute fracture or subluxation.  No radiopaque foreign body.  IMPRESSION: No acute fracture or subluxation.   Original Report Authenticated By: Natasha Mead, M.D.      Assessment & Plan:

## 2012-12-12 NOTE — Patient Instructions (Signed)

## 2012-12-15 NOTE — Assessment & Plan Note (Signed)
I will recheck his K+ level and will also check his Mg++ level He will continue taking the K+ supplement

## 2012-12-15 NOTE — Assessment & Plan Note (Signed)
He will continue percocet as needed for pain 

## 2012-12-15 NOTE — Assessment & Plan Note (Signed)
His BP is well controlled 

## 2012-12-26 ENCOUNTER — Ambulatory Visit (INDEPENDENT_AMBULATORY_CARE_PROVIDER_SITE_OTHER): Payer: BC Managed Care – PPO | Admitting: *Deleted

## 2012-12-26 DIAGNOSIS — I82409 Acute embolism and thrombosis of unspecified deep veins of unspecified lower extremity: Secondary | ICD-10-CM

## 2012-12-26 DIAGNOSIS — Z7901 Long term (current) use of anticoagulants: Secondary | ICD-10-CM

## 2012-12-26 DIAGNOSIS — I6350 Cerebral infarction due to unspecified occlusion or stenosis of unspecified cerebral artery: Secondary | ICD-10-CM

## 2012-12-26 MED ORDER — WARFARIN SODIUM 7.5 MG PO TABS: ORAL_TABLET | ORAL | Status: DC

## 2013-01-07 ENCOUNTER — Telehealth: Payer: Self-pay

## 2013-01-07 DIAGNOSIS — M1712 Unilateral primary osteoarthritis, left knee: Secondary | ICD-10-CM

## 2013-01-07 DIAGNOSIS — M25569 Pain in unspecified knee: Secondary | ICD-10-CM

## 2013-01-07 NOTE — Telephone Encounter (Signed)
Patient called [facility] requesting medication refill on oxycodone 10-325

## 2013-01-07 NOTE — Telephone Encounter (Signed)
ok 

## 2013-01-08 MED ORDER — OXYCODONE-ACETAMINOPHEN 10-325 MG PO TABS: 1.0000 | ORAL_TABLET | Freq: Three times a day (TID) | ORAL | Status: DC | PRN

## 2013-01-08 NOTE — Telephone Encounter (Signed)
RX printed and pt notified to pick up. 

## 2013-01-09 ENCOUNTER — Telehealth: Payer: Self-pay | Admitting: *Deleted

## 2013-01-09 NOTE — Telephone Encounter (Signed)
Pt dropped off paperwork in office for Dr. Clifton James to complete. Paperwork is Health visitor Depression". I spoke with pt and told him we could not complete this paperwork as Dr. Clifton James did not treat him for this.  He will pick up uncompleted paperwork when here for appt with Dr. Clifton James on Feb. 14, 2014.

## 2013-01-23 ENCOUNTER — Ambulatory Visit: Payer: BC Managed Care – PPO | Admitting: Cardiovascular Disease

## 2013-01-30 ENCOUNTER — Ambulatory Visit (INDEPENDENT_AMBULATORY_CARE_PROVIDER_SITE_OTHER): Payer: BC Managed Care – PPO | Admitting: *Deleted

## 2013-01-30 DIAGNOSIS — I6350 Cerebral infarction due to unspecified occlusion or stenosis of unspecified cerebral artery: Secondary | ICD-10-CM

## 2013-01-30 DIAGNOSIS — I82409 Acute embolism and thrombosis of unspecified deep veins of unspecified lower extremity: Secondary | ICD-10-CM

## 2013-01-30 DIAGNOSIS — Z7901 Long term (current) use of anticoagulants: Secondary | ICD-10-CM

## 2013-02-07 ENCOUNTER — Encounter (HOSPITAL_COMMUNITY): Payer: Self-pay | Admitting: *Deleted

## 2013-02-07 ENCOUNTER — Emergency Department (HOSPITAL_COMMUNITY)
Admission: EM | Admit: 2013-02-07 | Discharge: 2013-02-08 | Disposition: A | Discharge status: Home / Self Care | Payer: BC Managed Care – PPO | Attending: Emergency Medicine | Admitting: Emergency Medicine

## 2013-02-07 DIAGNOSIS — Z79899 Other long term (current) drug therapy: Secondary | ICD-10-CM | POA: Insufficient documentation

## 2013-02-07 DIAGNOSIS — R112 Nausea with vomiting, unspecified: Secondary | ICD-10-CM | POA: Insufficient documentation

## 2013-02-07 DIAGNOSIS — Z7901 Long term (current) use of anticoagulants: Secondary | ICD-10-CM | POA: Insufficient documentation

## 2013-02-07 DIAGNOSIS — Z7982 Long term (current) use of aspirin: Secondary | ICD-10-CM | POA: Insufficient documentation

## 2013-02-07 DIAGNOSIS — R197 Diarrhea, unspecified: Secondary | ICD-10-CM | POA: Insufficient documentation

## 2013-02-07 DIAGNOSIS — R5381 Other malaise: Secondary | ICD-10-CM | POA: Insufficient documentation

## 2013-02-07 DIAGNOSIS — K2970 Gastritis, unspecified, without bleeding: Secondary | ICD-10-CM | POA: Insufficient documentation

## 2013-02-07 DIAGNOSIS — R5383 Other fatigue: Secondary | ICD-10-CM | POA: Insufficient documentation

## 2013-02-07 DIAGNOSIS — IMO0001 Reserved for inherently not codable concepts without codable children: Secondary | ICD-10-CM | POA: Insufficient documentation

## 2013-02-07 DIAGNOSIS — I10 Essential (primary) hypertension: Secondary | ICD-10-CM | POA: Insufficient documentation

## 2013-02-07 DIAGNOSIS — E785 Hyperlipidemia, unspecified: Secondary | ICD-10-CM | POA: Insufficient documentation

## 2013-02-07 DIAGNOSIS — R109 Unspecified abdominal pain: Secondary | ICD-10-CM | POA: Insufficient documentation

## 2013-02-07 DIAGNOSIS — K219 Gastro-esophageal reflux disease without esophagitis: Secondary | ICD-10-CM | POA: Insufficient documentation

## 2013-02-07 DIAGNOSIS — R509 Fever, unspecified: Secondary | ICD-10-CM | POA: Insufficient documentation

## 2013-02-07 DIAGNOSIS — Z87798 Personal history of other (corrected) congenital malformations: Secondary | ICD-10-CM | POA: Insufficient documentation

## 2013-02-07 DIAGNOSIS — Z87891 Personal history of nicotine dependence: Secondary | ICD-10-CM | POA: Insufficient documentation

## 2013-02-07 DIAGNOSIS — Z86718 Personal history of other venous thrombosis and embolism: Secondary | ICD-10-CM | POA: Insufficient documentation

## 2013-02-07 DIAGNOSIS — Z87448 Personal history of other diseases of urinary system: Secondary | ICD-10-CM | POA: Insufficient documentation

## 2013-02-07 DIAGNOSIS — Z8669 Personal history of other diseases of the nervous system and sense organs: Secondary | ICD-10-CM | POA: Insufficient documentation

## 2013-02-07 DIAGNOSIS — I2510 Atherosclerotic heart disease of native coronary artery without angina pectoris: Secondary | ICD-10-CM | POA: Insufficient documentation

## 2013-02-07 DIAGNOSIS — Z8673 Personal history of transient ischemic attack (TIA), and cerebral infarction without residual deficits: Secondary | ICD-10-CM | POA: Insufficient documentation

## 2013-02-07 DIAGNOSIS — M129 Arthropathy, unspecified: Secondary | ICD-10-CM | POA: Insufficient documentation

## 2013-02-07 LAB — CBC WITH DIFFERENTIAL/PLATELET
Basophils Absolute: 0 10*3/uL (ref 0.0–0.1)
Basophils Relative: 0 % (ref 0–1)
Eosinophils Absolute: 0.2 10*3/uL (ref 0.0–0.7)
HCT: 43.7 % (ref 39.0–52.0)
MCHC: 34.6 g/dL (ref 30.0–36.0)
Monocytes Absolute: 0.2 10*3/uL (ref 0.1–1.0)
Neutro Abs: 12.3 10*3/uL — ABNORMAL HIGH (ref 1.7–7.7)
Neutrophils Relative %: 91 % — ABNORMAL HIGH (ref 43–77)
RDW: 12.8 % (ref 11.5–15.5)

## 2013-02-07 LAB — COMPREHENSIVE METABOLIC PANEL
ALT: 25 U/L (ref 0–53)
Alkaline Phosphatase: 122 U/L — ABNORMAL HIGH (ref 39–117)
CO2: 29 mEq/L (ref 19–32)
GFR calc Af Amer: >90 mL/min (ref >90)
GFR calc non Af Amer: 85 mL/min — ABNORMAL LOW (ref >90)
Glucose, Bld: 128 mg/dL — ABNORMAL HIGH (ref 70–99)
Potassium: 3.7 mEq/L (ref 3.5–5.1)
Sodium: 140 mEq/L (ref 135–145)

## 2013-02-07 MED ORDER — SODIUM CHLORIDE 0.9 % IV BOLUS (SEPSIS)
1000.0000 mL | Freq: Once | INTRAVENOUS | Status: AC
  Administered 2013-02-07: 1000 mL via INTRAVENOUS

## 2013-02-07 MED ORDER — MORPHINE SULFATE 4 MG/ML IJ SOLN
4.0000 mg | Freq: Once | INTRAMUSCULAR | Status: AC
  Administered 2013-02-07: 4 mg via INTRAVENOUS
  Filled 2013-02-07: qty 1

## 2013-02-07 MED ORDER — ONDANSETRON 4 MG PO TBDP
8.0000 mg | ORAL_TABLET | Freq: Once | ORAL | Status: AC
  Administered 2013-02-07: 8 mg via ORAL
  Filled 2013-02-07: qty 2

## 2013-02-07 MED ORDER — ONDANSETRON 8 MG PO TBDP: 8.0000 mg | ORAL_TABLET | Freq: Three times a day (TID) | ORAL | Status: DC | PRN

## 2013-02-07 MED ORDER — ACETAMINOPHEN 325 MG PO TABS
650.0000 mg | ORAL_TABLET | Freq: Once | ORAL | Status: DC
  Filled 2013-02-07: qty 2

## 2013-02-07 NOTE — ED Notes (Signed)
Pt to ED c/o vomiting and diarrhea since 10 this morning.  Pt c/o of L peri-umbilical pain as well.

## 2013-02-07 NOTE — ED Notes (Signed)
Gave PO ginger ale/will check back with patient

## 2013-02-07 NOTE — ED Notes (Signed)
Pt stated that he took all of his daily meds today except for his warfarin because he was afraid that would come up too like the rest of them.

## 2013-02-07 NOTE — ED Provider Notes (Signed)
History     CSN: [id number]  Arrival date & time 02/07/13  4098   First MD Initiated Contact with Patient 02/07/13 2111      Chief Complaint  Patient presents with  . Diarrhea  . Emesis    (Consider location/radiation/quality/duration/timing/severity/associated sxs/prior treatment) Patient is a 52 y.o. male presenting with diarrhea and vomiting. The history is provided by the patient.  Diarrhea Associated symptoms: abdominal pain, fever, myalgias and vomiting   Associated symptoms: no headaches   Emesis Associated symptoms: abdominal pain, diarrhea and myalgias   Associated symptoms: no headaches    patient presents with nausea vomiting and diarrhea that began today. He's also had some crampy abdominal pain with it. The vomiting diarrhea began first and then was followed by the abdominal pain. He's had decreased appetite. He's had a fever and chills. He feels weak all over. No dysuria. He has had a decreased appetite. He states he has not been able to take his ideations. No recent alcohol intake.  Past Medical History  Diagnosis Date  . GERD (gastroesophageal reflux disease)   . HLD (hyperlipidemia)   . HTN (hypertension)   . Tobacco abuse   . CVA (cerebral vascular accident) 3/11  . TIA (transient ischemic attack) 3/11  . Hematuria 8/12  . Sleep apnea     states he does not use CPAP  . Arthritis   . PFO (patent foramen ovale)     echo 8/12: EF 55-65%, grade 2 diast dysfnx; +PFO on bubble study;  TEE 4/13: EF normal, atrial septum with suspicion for interatrial septum fenestrations without flow across and few large bubbles noted in LA.  This was not felt to require closure   . CAD (coronary artery disease)     cath 10/11 with mild LAD, 80% mid circ, PLA 70%. no s/p promus DES circumflex artery 10/13/10  . DVT (deep venous thrombosis)     Past Surgical History  Procedure Laterality Date  . Knee arthroscopy      2 on left and 3 on rt  . Coronary angioplasty with stent  placement    . Tee without cardioversion  03/21/2012    Procedure: TRANSESOPHAGEAL ECHOCARDIOGRAM (TEE);  Surgeon: Pricilla Riffle, MD;  Location: [facility] ENDOSCOPY;  Service: Cardiovascular;  Laterality: N/A;    Family History  Problem Relation Age of Onset  . Cancer Neg Hx   . Kidney disease Neg Hx   . Diabetes Neg Hx     History  Substance Use Topics  . Smoking status: Former Smoker -- 1.00 packs/day for 26 years    Types: Cigarettes    Quit date: 03/10/2012  . Smokeless tobacco: Never Used     Comment: 1/2 ppd x 35 years  Has already quit smoking  . Alcohol Use: No      Review of Systems  Constitutional: Positive for fever and fatigue. Negative for activity change and appetite change.  HENT: Negative for neck stiffness.   Eyes: Negative for pain.  Respiratory: Negative for chest tightness and shortness of breath.   Cardiovascular: Negative for chest pain and leg swelling.  Gastrointestinal: Positive for vomiting, abdominal pain and diarrhea. Negative for nausea.  Genitourinary: Negative for flank pain.  Musculoskeletal: Positive for myalgias. Negative for back pain.  Skin: Negative for rash.  Neurological: Negative for weakness, numbness and headaches.  Psychiatric/Behavioral: Negative for behavioral problems.    Allergies  Iodine  Home Medications   Current Outpatient Rx  Name  Route  Sig  Dispense  Refill  . aspirin EC 81 MG EC tablet   Oral   Take 1 tablet (81 mg total) by mouth daily.         Marland Kitchen atorvastatin (LIPITOR) 80 MG tablet   Oral   Take 1 tablet (80 mg total) by mouth at bedtime.   90 tablet   2   . carvedilol (COREG) 12.5 MG tablet   Oral   Take 1 tablet (12.5 mg total) by mouth 2 (two) times daily.   60 tablet   6   . folic acid (FOLVITE) 1 MG tablet   Oral   Take 1 tablet (1 mg total) by mouth daily.   90 tablet   3   . losartan (COZAAR) 100 MG tablet      TAKE 1 TABLET BY MOUTH DAILY   30 tablet   2   . nitroGLYCERIN (NITROSTAT) 0.4  MG SL tablet   Sublingual   Place 1 tablet (0.4 mg total) under the tongue every 5 (five) minutes x 3 doses as needed for chest pain.   25 tablet   3   . pantoprazole (PROTONIX) 40 MG tablet   Oral   Take 1 tablet (40 mg total) by mouth daily.   30 tablet   11   . potassium chloride SA (K-DUR,KLOR-CON) 20 MEQ tablet   Oral   Take 2 tablets (40 mEq total) by mouth 2 (two) times daily.   120 tablet   6   . warfarin (COUMADIN) 7.5 MG tablet   Oral   Take 7.5 mg by mouth daily.         . ondansetron (ZOFRAN-ODT) 8 MG disintegrating tablet   Oral   Take 1 tablet (8 mg total) by mouth every 8 (eight) hours as needed for nausea.   20 tablet   0   . warfarin (COUMADIN) 7.5 MG tablet      Take as directed by coumadin clinic .   40 tablet   3     Take as directed by coumadin clinic     BP 133/81  Pulse 100  Temp(Src) 98.4 F (36.9 C) (Oral)  Resp 24  SpO2 94%  Physical Exam  Nursing note and vitals reviewed. Constitutional: He is oriented to person, place, and time. He appears well-developed and well-nourished.  HENT:  Head: Normocephalic and atraumatic.  Eyes: EOM are normal. Pupils are equal, round, and reactive to light.  Neck: Normal range of motion. Neck supple.  Cardiovascular: Normal rate, regular rhythm and normal heart sounds.   No murmur heard. Pulmonary/Chest: Effort normal and breath sounds normal.  Abdominal: Soft. Bowel sounds are normal. He exhibits no distension and no mass. There is tenderness. There is no rebound and no guarding.  Minimal diffuse abdominal pain. No rebound or guarding.  Musculoskeletal: Normal range of motion. He exhibits no edema.  Neurological: He is alert and oriented to person, place, and time. No cranial nerve deficit.  Skin: Skin is warm and dry.  Psychiatric: He has a normal mood and affect.    ED Course  Procedures (including critical care time)  Labs Reviewed  COMPREHENSIVE METABOLIC PANEL - Abnormal; Notable for  the following:    Glucose, Bld 128 (*)    Alkaline Phosphatase 122 (*)    GFR calc non Af Amer 85 (*)    All other components within normal limits  CBC WITH DIFFERENTIAL - Abnormal; Notable for the following:    WBC 13.5 (*)    Neutrophils  Relative 91 (*)    Neutro Abs 12.3 (*)    Lymphocytes Relative 6 (*)    Monocytes Relative 2 (*)    All other components within normal limits  GLUCOSE, CAPILLARY - Abnormal; Notable for the following:    Glucose-Capillary 117 (*)    All other components within normal limits   No results found.   1. Nausea vomiting and diarrhea       MDM  Patient presents with nausea diarrhea abdominal pain. Initial lab work is reassuring with a mildly elevated white count of 13.5. Patient was initially doing better, however that he began to have increased upper abdominal pain. CT scan will be done at this time.        Juliet Rude. Rubin Payor, MD 02/07/13 2348

## 2013-02-07 NOTE — ED Notes (Signed)
Pt unable to tolerate PO, thus cannot take tylenol at this time until zofran begins to work.

## 2013-02-08 ENCOUNTER — Emergency Department (HOSPITAL_COMMUNITY): Payer: BC Managed Care – PPO

## 2013-02-08 ENCOUNTER — Encounter (HOSPITAL_COMMUNITY): Payer: Self-pay | Admitting: Radiology

## 2013-02-08 LAB — PROTIME-INR: INR: 1.5 — ABNORMAL HIGH (ref 0.00–1.49)

## 2013-02-08 MED ORDER — SODIUM CHLORIDE 0.9 % IV BOLUS (SEPSIS)
1000.0000 mL | Freq: Once | INTRAVENOUS | Status: AC
  Administered 2013-02-08: 1000 mL via INTRAVENOUS

## 2013-02-08 MED ORDER — MORPHINE SULFATE 4 MG/ML IJ SOLN
4.0000 mg | Freq: Once | INTRAMUSCULAR | Status: DC
Start: 2013-02-08 — End: 2013-02-08
  Filled 2013-02-08: qty 1

## 2013-02-08 MED ORDER — TRAMADOL HCL 50 MG PO TABS: 50.0000 mg | ORAL_TABLET | Freq: Four times a day (QID) | ORAL | Status: DC | PRN

## 2013-02-08 MED ORDER — MORPHINE SULFATE 4 MG/ML IJ SOLN
4.0000 mg | Freq: Once | INTRAMUSCULAR | Status: AC
  Administered 2013-02-08: 4 mg via INTRAVENOUS

## 2013-02-08 MED ORDER — GI COCKTAIL ~~LOC~~
30.0000 mL | Freq: Once | ORAL | Status: AC
  Administered 2013-02-08: 30 mL via ORAL
  Filled 2013-02-08: qty 30

## 2013-02-08 MED ORDER — IOHEXOL 300 MG/ML  SOLN
50.0000 mL | INTRAMUSCULAR | Status: AC
  Administered 2013-02-08: 50 mL via ORAL

## 2013-02-08 MED ORDER — PANTOPRAZOLE SODIUM 40 MG IV SOLR
40.0000 mg | Freq: Once | INTRAVENOUS | Status: AC
  Administered 2013-02-08: 40 mg via INTRAVENOUS
  Filled 2013-02-08: qty 40

## 2013-02-09 ENCOUNTER — Telehealth: Payer: Self-pay | Admitting: Internal Medicine

## 2013-02-09 NOTE — Telephone Encounter (Signed)
[facility] Triage Call Report Triage Record Num: [id number] Operator: Ether Griffins Patient Name: Tony Davis Call Date & Time: 02/07/2013 5:11:16PM Patient Phone: [phone] PCP: [name] Patient Gender: Male PCP Fax : Patient DOB: [birth year] Practice Name: [facility] Reason for Call: Caller: [name]/Patient; PCP: [name] (Adults only); CB#: [phone]; Calling about vomiting and diarrhea,legs cramping,mouth very dry,very weak. Not able to keep anything down, and unable to take daily meds. Onset 02/07/13 @ 1200. Guideline: Nausea or Vomiting. Disposition: See ED Immediately. Reason for Disposition: Signs of dehydration. Advised to go to ED. Protocol(s) Used: Nausea or Vomiting Recommended Outcome per Protocol: See ED Immediately Reason for Outcome: Signs of dehydration Care Advice: ~ Another adult should drive.

## 2013-02-13 ENCOUNTER — Ambulatory Visit: Payer: BC Managed Care – PPO | Admitting: Cardiovascular Disease

## 2013-02-17 ENCOUNTER — Encounter: Payer: Self-pay | Admitting: Cardiovascular Disease

## 2013-02-23 ENCOUNTER — Telehealth: Payer: Self-pay | Admitting: Internal Medicine

## 2013-02-23 NOTE — Telephone Encounter (Signed)
Pt req refill for Oxycodone 10-325mg . Please call pt if this is ok, pt is out of this med.

## 2013-02-24 NOTE — Telephone Encounter (Signed)
He needs to be seen

## 2013-02-26 NOTE — Telephone Encounter (Signed)
Appt on March 28.

## 2013-03-06 ENCOUNTER — Ambulatory Visit (INDEPENDENT_AMBULATORY_CARE_PROVIDER_SITE_OTHER): Payer: BC Managed Care – PPO

## 2013-03-06 ENCOUNTER — Encounter: Payer: Self-pay | Admitting: Internal Medicine

## 2013-03-06 ENCOUNTER — Ambulatory Visit (INDEPENDENT_AMBULATORY_CARE_PROVIDER_SITE_OTHER): Payer: BC Managed Care – PPO | Admitting: Internal Medicine

## 2013-03-06 ENCOUNTER — Other Ambulatory Visit (INDEPENDENT_AMBULATORY_CARE_PROVIDER_SITE_OTHER): Payer: BC Managed Care – PPO

## 2013-03-06 VITALS — BP 130/84 | HR 86 | Temp 97.8°F | Resp 16 | Wt 266.5 lb

## 2013-03-06 DIAGNOSIS — D529 Folate deficiency anemia, unspecified: Secondary | ICD-10-CM

## 2013-03-06 DIAGNOSIS — M1712 Unilateral primary osteoarthritis, left knee: Secondary | ICD-10-CM

## 2013-03-06 DIAGNOSIS — E876 Hypokalemia: Secondary | ICD-10-CM

## 2013-03-06 DIAGNOSIS — I10 Essential (primary) hypertension: Secondary | ICD-10-CM

## 2013-03-06 DIAGNOSIS — Z9989 Dependence on other enabling machines and devices: Secondary | ICD-10-CM

## 2013-03-06 DIAGNOSIS — IMO0002 Reserved for concepts with insufficient information to code with codable children: Secondary | ICD-10-CM

## 2013-03-06 DIAGNOSIS — I6350 Cerebral infarction due to unspecified occlusion or stenosis of unspecified cerebral artery: Secondary | ICD-10-CM

## 2013-03-06 DIAGNOSIS — IMO0001 Reserved for inherently not codable concepts without codable children: Secondary | ICD-10-CM

## 2013-03-06 DIAGNOSIS — I82409 Acute embolism and thrombosis of unspecified deep veins of unspecified lower extremity: Secondary | ICD-10-CM

## 2013-03-06 DIAGNOSIS — E785 Hyperlipidemia, unspecified: Secondary | ICD-10-CM

## 2013-03-06 DIAGNOSIS — M1710 Unilateral primary osteoarthritis, unspecified knee: Secondary | ICD-10-CM

## 2013-03-06 DIAGNOSIS — Z7901 Long term (current) use of anticoagulants: Secondary | ICD-10-CM

## 2013-03-06 DIAGNOSIS — G4733 Obstructive sleep apnea (adult) (pediatric): Secondary | ICD-10-CM

## 2013-03-06 LAB — CBC WITH DIFFERENTIAL/PLATELET
Basophils Relative: 0.7 % (ref 0.0–3.0)
Eosinophils Relative: 3.4 % (ref 0.0–5.0)
HCT: 41.6 % (ref 39.0–52.0)
Lymphs Abs: 2.4 10*3/uL (ref 0.7–4.0)
MCV: 86.3 fl (ref 78.0–100.0)
Monocytes Absolute: 0.5 10*3/uL (ref 0.1–1.0)
RBC: 4.82 Mil/uL (ref 4.22–5.81)
WBC: 8.4 10*3/uL (ref 4.5–10.5)

## 2013-03-06 LAB — COMPREHENSIVE METABOLIC PANEL
Albumin: 4.3 g/dL (ref 3.5–5.2)
Alkaline Phosphatase: 104 U/L (ref 39–117)
BUN: 16 mg/dL (ref 6–23)
CO2: 30 mEq/L (ref 19–32)
GFR: 97.51 mL/min (ref >60.00)
Glucose, Bld: 105 mg/dL — ABNORMAL HIGH (ref 70–99)
Potassium: 3.7 mEq/L (ref 3.5–5.1)
Total Bilirubin: 0.5 mg/dL (ref 0.3–1.2)

## 2013-03-06 LAB — FOLATE: Folate: 5.7 ng/mL — ABNORMAL LOW (ref >5.9)

## 2013-03-06 LAB — LDL CHOLESTEROL, DIRECT: Direct LDL: 154.3 mg/dL

## 2013-03-06 LAB — VITAMIN B12: Vitamin B-12: 267 pg/mL (ref 211–911)

## 2013-03-06 MED ORDER — HYDROCODONE-ACETAMINOPHEN 5-325 MG PO TABS: 1.0000 | ORAL_TABLET | Freq: Four times a day (QID) | ORAL | Status: DC | PRN

## 2013-03-06 NOTE — Assessment & Plan Note (Signed)
Will recheck his K+ level today

## 2013-03-06 NOTE — Progress Notes (Signed)
Subjective:    Patient ID: Tony Davis., male    DOB: 06/23/1961, 52 y.o.   MRN: [id number]  Hypertension This is a chronic problem. The current episode started more than 1 year ago. The problem has been gradually improving since onset. The problem is controlled. Pertinent negatives include no anxiety, blurred vision, chest pain, headaches, malaise/fatigue, neck pain, orthopnea, palpitations, peripheral edema, PND, shortness of breath or sweats. There are no associated agents to hypertension. Risk factors for coronary artery disease include obesity and male gender. Compliance problems include exercise and diet.  Hypertensive end-organ damage includes CVA. Identifiable causes of hypertension include sleep apnea.      Review of Systems  Constitutional: Positive for fatigue and unexpected weight change (weight gain). Negative for fever, chills, malaise/fatigue, diaphoresis, activity change and appetite change.  HENT: Negative.  Negative for neck pain.   Eyes: Negative.  Negative for blurred vision.  Respiratory: Positive for apnea. Negative for cough, choking, chest tightness, shortness of breath, wheezing and stridor.   Cardiovascular: Negative.  Negative for chest pain, palpitations, orthopnea, leg swelling and PND.  Gastrointestinal: Negative.  Negative for nausea, vomiting, abdominal pain, diarrhea, constipation and blood in stool.  Endocrine: Negative.  Negative for polydipsia, polyphagia and polyuria.  Genitourinary: Negative.   Musculoskeletal: Positive for arthralgias (knees) and gait problem (he feels off balance since his CVA). Negative for myalgias, back pain and joint swelling.  Skin: Negative for color change, pallor, rash and wound.  Allergic/Immunologic: Negative.   Neurological: Positive for weakness (he feels weak all over). Negative for dizziness, tremors, seizures, syncope, facial asymmetry, speech difficulty, light-headedness, numbness and headaches.  Hematological:  Negative.  Negative for adenopathy. Does not bruise/bleed easily.  Psychiatric/Behavioral: Negative.        Objective:   Physical Exam  Vitals reviewed. Constitutional: He is oriented to person, place, and time. He appears well-developed and well-nourished. No distress.  HENT:  Head: Normocephalic and atraumatic.  Mouth/Throat: Oropharynx is clear and moist.  Eyes: Conjunctivae are normal. Right eye exhibits no discharge. Left eye exhibits no discharge. No scleral icterus.  Neck: Normal range of motion. Neck supple. No JVD present. No tracheal deviation present. No thyromegaly present.  Cardiovascular: Normal rate, regular rhythm, normal heart sounds and intact distal pulses.  Exam reveals no gallop and no friction rub.   No murmur heard. Pulmonary/Chest: Effort normal and breath sounds normal. No stridor. No respiratory distress. He has no wheezes. He has no rales. He exhibits no tenderness.  Abdominal: Soft. Bowel sounds are normal. He exhibits no distension and no mass. There is no tenderness. There is no rebound and no guarding.  Musculoskeletal: Normal range of motion. He exhibits no edema and no tenderness.       Right knee: He exhibits normal range of motion, no swelling, no effusion, no ecchymosis and no deformity. No tenderness found.       Left knee: Normal. He exhibits normal range of motion, no swelling, no effusion, no ecchymosis and no deformity. No tenderness found.  Lymphadenopathy:    He has no cervical adenopathy.  Neurological: He is alert and oriented to person, place, and time. He displays no atrophy, no tremor and normal reflexes. No cranial nerve deficit or sensory deficit. He exhibits normal muscle tone. He displays no seizure activity. Coordination and gait abnormal. He displays no Babinski's sign on the right side. He displays no Babinski's sign on the left side.  Skin: Skin is warm and dry. No rash noted. He  is not diaphoretic. No erythema. No pallor.  Psychiatric:  He has a normal mood and affect. His behavior is normal. Judgment and thought content normal.      Lab Results  Component Value Date   WBC 13.5* 02/07/2013   HGB 15.1 02/07/2013   HCT 43.7 02/07/2013   PLT 342 02/07/2013   GLUCOSE 128* 02/07/2013   CHOL 181 05/20/2012   TRIG 138.0 05/20/2012   HDL 35.90* 05/20/2012   LDLCALC 118* 05/20/2012   ALT 25 02/07/2013   AST 28 02/07/2013   NA 140 02/07/2013   K 3.7 02/07/2013   CL 101 02/07/2013   CREATININE 1.02 02/07/2013   BUN 18 02/07/2013   CO2 29 02/07/2013   PSA 0.38 08/06/2011   INR 2.9 03/06/2013   HGBA1C 6.3 10/13/2012      Assessment & Plan:

## 2013-03-06 NOTE — Assessment & Plan Note (Signed)
Signed CSC Will check his UDS (says he has not taken percocet or vicodin for several weeks)

## 2013-03-06 NOTE — Assessment & Plan Note (Signed)
He is not wearing CPAP ----> sleep med follow up

## 2013-03-06 NOTE — Assessment & Plan Note (Signed)
I will recheck his A1C today and will advise further 

## 2013-03-06 NOTE — Assessment & Plan Note (Signed)
I will recheck his FLP today and will advise further if needed 

## 2013-03-06 NOTE — Assessment & Plan Note (Signed)
His BP is well controlled Today I will check his lytes and renal function 

## 2013-03-06 NOTE — Assessment & Plan Note (Signed)
He needs a neurology follow up

## 2013-03-06 NOTE — Assessment & Plan Note (Signed)
Will recheck his CBC and folate level today

## 2013-03-06 NOTE — Patient Instructions (Signed)

## 2013-03-08 ENCOUNTER — Encounter: Payer: Self-pay | Admitting: Internal Medicine

## 2013-03-09 ENCOUNTER — Other Ambulatory Visit (HOSPITAL_COMMUNITY): Payer: Self-pay | Admitting: Physician Assistant

## 2013-03-09 ENCOUNTER — Other Ambulatory Visit: Payer: Self-pay | Admitting: Cardiovascular Disease

## 2013-03-10 ENCOUNTER — Other Ambulatory Visit: Payer: Self-pay | Admitting: *Deleted

## 2013-03-17 ENCOUNTER — Encounter: Payer: Self-pay | Admitting: Cardiovascular Disease

## 2013-03-17 ENCOUNTER — Ambulatory Visit (INDEPENDENT_AMBULATORY_CARE_PROVIDER_SITE_OTHER): Payer: BC Managed Care – PPO | Admitting: Cardiovascular Disease

## 2013-03-17 VITALS — BP 134/82 | HR 86 | Ht 70.0 in | Wt 263.0 lb

## 2013-03-17 DIAGNOSIS — I82409 Acute embolism and thrombosis of unspecified deep veins of unspecified lower extremity: Secondary | ICD-10-CM

## 2013-03-17 DIAGNOSIS — Q211 Atrial septal defect: Secondary | ICD-10-CM

## 2013-03-17 DIAGNOSIS — I2510 Atherosclerotic heart disease of native coronary artery without angina pectoris: Secondary | ICD-10-CM

## 2013-03-17 DIAGNOSIS — Q2111 Secundum atrial septal defect: Secondary | ICD-10-CM

## 2013-03-17 DIAGNOSIS — G4730 Sleep apnea, unspecified: Secondary | ICD-10-CM

## 2013-03-17 NOTE — Progress Notes (Signed)
History of Present Illness: 52 yo AAM with history of CAD, HTN, hyperlipidemia, impaired fasting glucose and obesity who is here today for cardiac follow up. I saw him in February 2011 for complaints of chest pain. I arranged a stress test, an echo and carotid dopplers at the first visit. He missed his follow up appt. The carotid dopplers were normal. He came in for his stress test and complained of neurological changes. He was seen by Dr. Riley Kill and the stress test was cancelled. He was sent over to Dr. Stones office and had MRI/MRA of the head and neck. MRA showed no occlusive disease. MRI showed non-specific white matter changes but no clear infarct, possibly related to hypertension. His echo shows septal and inferobasal hypokinesis with mild concentric hypertrophy, EF 45-50%. He was seen here in October 2011 and was still having chest pains. I arranged a left heart cath in the outpatient lab. He was found to have a stenosis in the mid Circumflex. He was brought back in 10/13/10 for planned PCI of the Circumflex. A Promus drug eluting stent was placed without any complications. Repeat echo August 2012 with PFO with positive bubble study. I arranged for him to see Dr. Excell Seltzer for PFO closure but he skipped his appointment. He was in [place] April 2013 and had acute onset of left arm and leg weakness. He was admitted there. CT head without acute stroke. Venous dopplers with chronic DVT right femoral vein. Carotids with <50% RICA stenosis, LICA ok. Echo with PFO. Echo 03/14/12 in [place] with normal LV size and function with PFO with positive bubble study. He was started on coumadin and Lovenox and left AMA from the hospital there. He was seen in [facility] ED on 03/17/12 and discharged home. I saw him in our office 03/19/12. At that time, he continued to have left sided weakness. He had not been compliant with plans for Lovenox and coumadin. He was admitted for Heparin and coumadin. TEE was done 03/21/12: EF normal,  atrial septum with suspicion for interatrial septum fenestrations without flow across and few large bubbles noted in LA. This was not felt to require closure. He has been maintained on coumadin and has been followed in our coumadin clinic. He was seen by Herma Carson, PA-C on 07/16/12. His Coreg was increased but he has not started the higher dose yet. Venous dopplers 2013 with no DVT. Norvasc reduced 8/13 because of lower extremity edema. He was seen in the ED at [facility] 02/07/13 with nausea and vomiting but discharged next am. He has been been diagnosed with OSA and has been started on CPAP. He has f/u with Dr. Maple Hudson tomorrow. He has not been wearing his CPAP.   He is here today for follow up. He tells me that he has been feeling well. No chest pain or SOB. He has stopped smoking. He has some lower extremity swelling but this resolves at night. His left sided weakness has improved. Tolerating coumadin with no bleeding.    Primary Care Physician: [name]  Last Lipid Profile:Lipid Panel     Component Value Date/Time   CHOL 213* 03/06/2013 0943   TRIG 176.0* 03/06/2013 0943   HDL 30.10* 03/06/2013 0943   CHOLHDL 7 03/06/2013 0943   VLDL 35.2 03/06/2013 0943   LDLCALC 118* 05/20/2012 1610     Past Medical History  Diagnosis Date  . GERD (gastroesophageal reflux disease)   . HLD (hyperlipidemia)   . HTN (hypertension)   . Tobacco abuse   .  CVA (cerebral vascular accident) 3/11  . TIA (transient ischemic attack) 3/11  . Hematuria 8/12  . Sleep apnea     states he does not use CPAP  . Arthritis   . PFO (patent foramen ovale)     echo 8/12: EF 55-65%, grade 2 diast dysfnx; +PFO on bubble study;  TEE 4/13: EF normal, atrial septum with suspicion for interatrial septum fenestrations without flow across and few large bubbles noted in LA.  This was not felt to require closure   . CAD (coronary artery disease)     cath 10/11 with mild LAD, 80% mid circ, PLA 70%. no s/p promus DES circumflex artery  10/13/10  . DVT (deep venous thrombosis)     Past Surgical History  Procedure Laterality Date  . Knee arthroscopy      2 on left and 3 on rt  . Coronary angioplasty with stent placement    . Tee without cardioversion  03/21/2012    Procedure: TRANSESOPHAGEAL ECHOCARDIOGRAM (TEE);  Surgeon: Pricilla Riffle, MD;  Location: [facility] ENDOSCOPY;  Service: Cardiovascular;  Laterality: N/A;    Current Outpatient Prescriptions  Medication Sig Dispense Refill  . amLODipine (NORVASC) 10 MG tablet TAKE 1 TABLET BY MOUTH DAILY  30 tablet  3  . aspirin EC 81 MG EC tablet Take 1 tablet (81 mg total) by mouth daily.      Marland Kitchen atorvastatin (LIPITOR) 80 MG tablet Take 1 tablet (80 mg total) by mouth at bedtime.  90 tablet  2  . carvedilol (COREG) 12.5 MG tablet Take 1 tablet (12.5 mg total) by mouth 2 (two) times daily.  60 tablet  6  . folic acid (FOLVITE) 1 MG tablet Take 1 tablet (1 mg total) by mouth daily.  90 tablet  3  . HYDROcodone-acetaminophen (NORCO/VICODIN) 5-325 MG per tablet Take 1 tablet by mouth every 6 (six) hours as needed for pain.  65 tablet  4  . losartan (COZAAR) 100 MG tablet TAKE 1 TABLET BY MOUTH DAILY  30 tablet  2  . nitroGLYCERIN (NITROSTAT) 0.4 MG SL tablet Place 1 tablet (0.4 mg total) under the tongue every 5 (five) minutes x 3 doses as needed for chest pain.  25 tablet  3  . ondansetron (ZOFRAN-ODT) 8 MG disintegrating tablet Take 1 tablet (8 mg total) by mouth every 8 (eight) hours as needed for nausea.  20 tablet  0  . pantoprazole (PROTONIX) 40 MG tablet Take 1 tablet (40 mg total) by mouth daily.  30 tablet  11  . potassium chloride SA (K-DUR,KLOR-CON) 20 MEQ tablet Take 2 tablets (40 mEq total) by mouth 2 (two) times daily.  120 tablet  6  . traMADol (ULTRAM) 50 MG tablet Take 1 tablet (50 mg total) by mouth every 6 (six) hours as needed for pain.  15 tablet  0  . warfarin (COUMADIN) 7.5 MG tablet Take as directed by coumadin clinic  40 tablet  3   No current facility-administered  medications for this visit.    Allergies  Allergen Reactions  . Iodine     REACTION: anaphylaxis    History   Social History  . Marital Status: Married    Spouse Name: N/A    Number of Children: N/A  . Years of Education: N/A   Occupational History  . Not on file.   Social History Main Topics  . Smoking status: Former Smoker -- 1.00 packs/day for 26 years    Types: Cigarettes    Quit  date: 03/10/2012  . Smokeless tobacco: Never Used     Comment: 1/2 ppd x 35 years  Has already quit smoking  . Alcohol Use: No  . Drug Use: No  . Sexually Active: Not Currently   Other Topics Concern  . Not on file   Social History Narrative   Married, 5 kids; lives in [place]. 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     Family History  Problem Relation Age of Onset  . Cancer Neg Hx   . Kidney disease Neg Hx   . Diabetes Neg Hx     Review of Systems:  As stated in the HPI and otherwise negative.   BP 134/82  Pulse 86  Ht 5\' 10"  (1.778 m)  Wt 263 lb (119.296 kg)  BMI 37.74 kg/m2  Physical Examination: General: Well developed, well nourished, NAD HEENT: OP clear, mucus membranes moist SKIN: warm, dry. No rashes. Neuro: No focal deficits Musculoskeletal: Muscle strength 5/5 all ext Psychiatric: Mood and affect normal Neck: No JVD, no carotid bruits, no thyromegaly, no lymphadenopathy. Lungs:Clear bilaterally, no wheezes, rhonci, crackles Cardiovascular: Regular rate and rhythm. No murmurs, gallops or rubs. Abdomen:Soft. Bowel sounds present. Non-tender.  Extremities: No lower extremity edema. Pulses are 2 + in the bilateral DP/PT.  Assessment and Plan:   1. CAD, NATIVE VESSEL: Stable. Will continue ASA, statin, beta blocker, ARB.   2. DVT: Will continue coumadin. This is also used for his neurological changes with possible very small PFO and prior CVA/TIA.   3. GERD: Improved. cont Protonix 40 mg po QDaily.   4. HYPERTENSION : BP controlled. Will  continue Cozaar, Norvasc and Coreg.   5. PATENT FORAMEN OVALE: No evidence of significant PFO on TEE. He is on coumadin for small fenestrations in atrial septum and neurological changes. Recommend long term anti-coagulation.   6. OSA with daytime somnolence: He has no desire to wear CPAP. He has not been compliant yet cannot understand why he is tired during the day. He is seeing Dr. Maple Hudson tomorrow. Hopefully he can find a mask that fits well.

## 2013-03-17 NOTE — Patient Instructions (Addendum)
Your physician wants you to follow-up in:  12 months.  You will receive a reminder letter in the mail two months in advance. If you don't receive a letter, please call our office to schedule the follow-up appointment.   

## 2013-03-18 ENCOUNTER — Ambulatory Visit (INDEPENDENT_AMBULATORY_CARE_PROVIDER_SITE_OTHER): Payer: BC Managed Care – PPO | Admitting: Internal Medicine

## 2013-03-18 ENCOUNTER — Encounter: Payer: Self-pay | Admitting: Internal Medicine

## 2013-03-18 VITALS — BP 124/78 | HR 88 | Ht 71.0 in | Wt 264.8 lb

## 2013-03-18 DIAGNOSIS — G4733 Obstructive sleep apnea (adult) (pediatric): Secondary | ICD-10-CM

## 2013-03-18 DIAGNOSIS — J310 Chronic rhinitis: Secondary | ICD-10-CM

## 2013-03-18 DIAGNOSIS — R0989 Other specified symptoms and signs involving the circulatory and respiratory systems: Secondary | ICD-10-CM

## 2013-03-18 DIAGNOSIS — Z87891 Personal history of nicotine dependence: Secondary | ICD-10-CM

## 2013-03-18 DIAGNOSIS — F17200 Nicotine dependence, unspecified, uncomplicated: Secondary | ICD-10-CM

## 2013-03-18 DIAGNOSIS — G4700 Insomnia, unspecified: Secondary | ICD-10-CM

## 2013-03-18 DIAGNOSIS — R0600 Dyspnea, unspecified: Secondary | ICD-10-CM

## 2013-03-18 DIAGNOSIS — Z720 Tobacco use: Secondary | ICD-10-CM

## 2013-03-18 NOTE — Patient Instructions (Addendum)
Order- schedule limited CT max fac    Dx chronic rhinitis  Order- schedule PFT     Dx hx tobacco user, dyspnea  Order-   Order CPAP titration sleep study  Dx OSA

## 2013-03-18 NOTE — Progress Notes (Signed)
03/18/13- 51 yoM former smsoker-Dr Shellia Carwin sleep study 2012 [facility]; has CPAP through [facility] but not using at this time; was told by hospital that he needed O2 at night as well and to let us know today. He falls asleep to easily when sitting quietly. Loud snoring and witnessed apneas. Chronically stuffy nose. No ENT surgery. NPSG 11/24/10- AHI 109.1/ hr, severe obstructive sleep apnea. CPAP was titrated to 14/AHI 0. Body weight was 237 pounds. He did not tolerate CPAP, feeling smothered, but continues to try to use it. He thinks he has gained 35 pounds since the study was done. Bedtime 8 to 9 PM with sleep latency 3-4 hours. Wakes 4 times for bathroom before finally up between 7 and 8 AM. Complicating medical problems include hypertension, coronary disease with cardiac stents, history of stroke. Tobacco abuse averaging one pack per day until quitting 03/10/2012. He was a school custodian, no longer working. Lives with his wife.  Prior to Admission medications   Medication Sig Start Date End Date Taking? Authorizing Provider  amLODipine (NORVASC) 10 MG tablet TAKE 1 TABLET BY MOUTH DAILY 03/09/13  Yes Kathleene Hazel, MD  atorvastatin (LIPITOR) 80 MG tablet Take 1 tablet (80 mg total) by mouth at bedtime. 05/06/12  Yes Kathleene Hazel, MD  carvedilol (COREG) 12.5 MG tablet Take 1 tablet (12.5 mg total) by mouth 2 (two) times daily. 07/16/12 07/16/13 Yes Kathleene Hazel, MD  folic acid (FOLVITE) 1 MG tablet Take 1 tablet (1 mg total) by mouth daily. 10/13/12  Yes Etta Grandchild, MD  HYDROcodone-acetaminophen (NORCO/VICODIN) 5-325 MG per tablet Take 1 tablet by mouth every 6 (six) hours as needed for pain. 03/06/13  Yes Etta Grandchild, MD  losartan (COZAAR) 100 MG tablet TAKE 1 TABLET BY MOUTH DAILY 03/09/13  Yes Kathleene Hazel, MD  nitroGLYCERIN (NITROSTAT) 0.4 MG SL tablet Place 1 tablet (0.4 mg total) under the tongue every 5 (five) minutes x 3 doses as  needed for chest pain. 03/24/12 03/24/13 Yes Roger A Arguello, PA-C  ondansetron (ZOFRAN-ODT) 8 MG disintegrating tablet Take 1 tablet (8 mg total) by mouth every 8 (eight) hours as needed for nausea. 02/07/13  Yes Juliet Rude. Pickering, MD  pantoprazole (PROTONIX) 40 MG tablet Take 1 tablet (40 mg total) by mouth daily. 05/20/12 05/20/13 Yes Kathleene Hazel, MD  potassium chloride SA (K-DUR,KLOR-CON) 20 MEQ tablet Take 2 tablets (40 mEq total) by mouth 2 (two) times daily. 06/13/12 06/13/13 Yes Kathleene Hazel, MD  warfarin (COUMADIN) 7.5 MG tablet Take as directed by coumadin clinic 03/10/13  Yes Kathleene Hazel, MD   Past Medical History  Diagnosis Date  . GERD (gastroesophageal reflux disease)   . HLD (hyperlipidemia)   . HTN (hypertension)   . Tobacco abuse   . CVA (cerebral vascular accident) 3/11  . TIA (transient ischemic attack) 3/11  . Hematuria 8/12  . Sleep apnea     states he does not use CPAP  . Arthritis   . PFO (patent foramen ovale)     echo 8/12: EF 55-65%, grade 2 diast dysfnx; +PFO on bubble study;  TEE 4/13: EF normal, atrial septum with suspicion for interatrial septum fenestrations without flow across and few large bubbles noted in LA.  This was not felt to require closure   . CAD (coronary artery disease)     cath 10/11 with mild LAD, 80% mid circ, PLA 70%. no s/p promus DES circumflex artery 10/13/10  . DVT (  deep venous thrombosis)   . Hemiparesis due to old cerebral infarction 03/20/2013   Past Surgical History  Procedure Laterality Date  . Knee arthroscopy      2 on left and 3 on rt  . Coronary angioplasty with stent placement    . Tee without cardioversion  03/21/2012    Procedure: TRANSESOPHAGEAL ECHOCARDIOGRAM (TEE);  Surgeon: Pricilla Riffle, MD;  Location: [facility] ENDOSCOPY;  Service: Cardiovascular;  Laterality: N/A;   Family History  Problem Relation Age of Onset  . Cancer Neg Hx   . Kidney disease Neg Hx   . Diabetes Neg Hx    History   Social  History  . Marital Status: Married    Spouse Name: N/A    Number of Children: N/A  . Years of Education: N/A   Occupational History  . Not on file.   Social History Main Topics  . Smoking status: Former Smoker -- 1.00 packs/day for 26 years    Types: Cigarettes    Quit date: 03/10/2012  . Smokeless tobacco: Never Used     Comment: 1/2 ppd x 35 years  Has already quit smoking  . Alcohol Use: No  . Drug Use: No  . Sexually Active: Not Currently   Other Topics Concern  . Not on file   Social History Narrative   Married, 5 kids; lives in [place]. 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    ROS-see HPI Constitutional:   No-   weight loss, night sweats, fevers, chills, fatigue, lassitude. HEENT:   No-  headaches, difficulty swallowing, tooth/dental problems, sore throat,       No-  sneezing, itching, ear ache, nasal congestion, post nasal drip,  CV:  No-   chest pain, orthopnea, PND, swelling in lower extremities, anasarca,                                  dizziness, palpitations Resp: No-   shortness of breath with exertion or at rest.              No-   productive cough,  No non-productive cough,  No- coughing up of blood.              No-   change in color of mucus.  No- wheezing.   Skin: No-   rash or lesions. GI:  No-   heartburn, indigestion, abdominal pain, nausea, vomiting, diarrhea,                 change in bowel habits, loss of appetite GU: No-   dysuria, change in color of urine, no urgency or frequency.  No- flank pain. MS:  No-   joint pain or swelling.  No- decreased range of motion.  No- back pain. Neuro-     nothing unusual Psych:  No- change in mood or affect. No depression or anxiety.  No memory loss.  OBJ- Physical Exam General- Alert, Oriented, Affect-appropriate, Distress- none acute. Overweight Skin- rash-none, lesions- none, excoriation- none Lymphadenopathy- none Head- atraumatic            Eyes- Gross vision intact, PERRLA, conjunctivae  and secretions clear            Ears- Hearing, canals-normal            Nose- Clear, no-Septal dev, mucus, polyps, erosion, perforation  Throat- Mallampati III-IV , mucosa clear , drainage- none, tonsils- atrophic. Own teeth. Neck- flexible , trachea midline, no stridor , thyroid nl, carotid no bruit Chest - symmetrical excursion , unlabored           Heart/CV- RRR , no murmur , no gallop  , no rub, nl s1 s2                           - JVD- none , edema+ trace, stasis changes- none, varices- none           Lung- clear to P&A, wheeze- none, cough- none , dullness-none, rub- none           Chest wall-  Abd- tender-no, distended-no, bowel sounds-present, HSM- no Br/ Gen/ Rectal- Not done, not indicated Extrem- cyanosis- none, clubbing, none, atrophy- none, strength- nl Neuro- grossly intact to observation

## 2013-03-19 ENCOUNTER — Ambulatory Visit (INDEPENDENT_AMBULATORY_CARE_PROVIDER_SITE_OTHER)
Admission: RE | Admit: 2013-03-19 | Discharge: 2013-03-19 | Disposition: A | Discharge status: Home / Self Care | Payer: BC Managed Care – PPO | Source: Ambulatory Visit | Attending: Internal Medicine | Admitting: Internal Medicine

## 2013-03-19 DIAGNOSIS — J310 Chronic rhinitis: Secondary | ICD-10-CM

## 2013-03-20 ENCOUNTER — Encounter: Payer: Self-pay | Admitting: Neurology

## 2013-03-20 ENCOUNTER — Ambulatory Visit (INDEPENDENT_AMBULATORY_CARE_PROVIDER_SITE_OTHER): Payer: BC Managed Care – PPO | Admitting: Neurology

## 2013-03-20 VITALS — BP 133/89 | HR 89 | Ht 71.25 in | Wt 260.0 lb

## 2013-03-20 DIAGNOSIS — I6350 Cerebral infarction due to unspecified occlusion or stenosis of unspecified cerebral artery: Secondary | ICD-10-CM

## 2013-03-20 DIAGNOSIS — I69959 Hemiplegia and hemiparesis following unspecified cerebrovascular disease affecting unspecified side: Secondary | ICD-10-CM

## 2013-03-20 DIAGNOSIS — I69359 Hemiplegia and hemiparesis following cerebral infarction affecting unspecified side: Secondary | ICD-10-CM | POA: Insufficient documentation

## 2013-03-20 NOTE — Progress Notes (Signed)
Reason for visit: Left hemiparesis  Tony Davis. is a 52 y.o. male  History of present illness:  [name] is a 52 year old right-handed black male with a history of coronary artery disease and hypertension. The patient sustained a stroke that occurred in 2011 associated with a left hemiparesis. The patient indicates that he has had problems with being able to read since that time. The patient has undergone some speech therapy, but he has indicated that he is still not effectively able to read. The patient indicates that since 2011, he has had episodes of transient left-sided weakness that affects the left arm, leg, and face. The patient has numbness on that side as well. The patient indicates that over time, these episodes have become more frequent. The patient may fall with the episodes of weakness that may last an entire day. The patient is having several events a week at this point. The patient denies any stiffening or jerking of the left side of the body. The patient is able to perform handwriting to some degree, but he is not able to effectively read. The patient has no other problems with his speech. The patient denies headache, and he denies syncope. The patient is sent to this office for an evaluation.  Past Medical History  Diagnosis Date  . GERD (gastroesophageal reflux disease)   . HLD (hyperlipidemia)   . HTN (hypertension)   . Tobacco abuse   . CVA (cerebral vascular accident) 3/11  . TIA (transient ischemic attack) 3/11  . Hematuria 8/12  . Sleep apnea     states he does not use CPAP  . Arthritis   . PFO (patent foramen ovale)     echo 8/12: EF 55-65%, grade 2 diast dysfnx; +PFO on bubble study;  TEE 4/13: EF normal, atrial septum with suspicion for interatrial septum fenestrations without flow across and few large bubbles noted in LA.  This was not felt to require closure   . CAD (coronary artery disease)     cath 10/11 with mild LAD, 80% mid circ, PLA 70%. no s/p  promus DES circumflex artery 10/13/10  . DVT (deep venous thrombosis)   . Hemiparesis due to old cerebral infarction 03/20/2013    Past Surgical History  Procedure Laterality Date  . Knee arthroscopy      2 on left and 3 on rt  . Coronary angioplasty with stent placement    . Tee without cardioversion  03/21/2012    Procedure: TRANSESOPHAGEAL ECHOCARDIOGRAM (TEE);  Surgeon: Pricilla Riffle, MD;  Location: [facility] ENDOSCOPY;  Service: Cardiovascular;  Laterality: N/A;    Family History  Problem Relation Age of Onset  . Cancer Neg Hx   . Kidney disease Neg Hx   . Diabetes Neg Hx     Social history:  reports that he quit smoking about a year ago. His smoking use included Cigarettes. He has a 26 pack-year smoking history. He has never used smokeless tobacco. He reports that he does not drink alcohol or use illicit drugs.  Medications:  Current Outpatient Prescriptions on File Prior to Visit  Medication Sig Dispense Refill  . amLODipine (NORVASC) 10 MG tablet TAKE 1 TABLET BY MOUTH DAILY  30 tablet  3  . aspirin EC 81 MG EC tablet Take 1 tablet (81 mg total) by mouth daily.      Marland Kitchen atorvastatin (LIPITOR) 80 MG tablet Take 1 tablet (80 mg total) by mouth at bedtime.  90 tablet  2  . carvedilol (  COREG) 12.5 MG tablet Take 1 tablet (12.5 mg total) by mouth 2 (two) times daily.  60 tablet  6  . folic acid (FOLVITE) 1 MG tablet Take 1 tablet (1 mg total) by mouth daily.  90 tablet  3  . HYDROcodone-acetaminophen (NORCO/VICODIN) 5-325 MG per tablet Take 1 tablet by mouth every 6 (six) hours as needed for pain.  65 tablet  4  . losartan (COZAAR) 100 MG tablet TAKE 1 TABLET BY MOUTH DAILY  30 tablet  2  . nitroGLYCERIN (NITROSTAT) 0.4 MG SL tablet Place 1 tablet (0.4 mg total) under the tongue every 5 (five) minutes x 3 doses as needed for chest pain.  25 tablet  3  . ondansetron (ZOFRAN-ODT) 8 MG disintegrating tablet Take 1 tablet (8 mg total) by mouth every 8 (eight) hours as needed for nausea.  20  tablet  0  . pantoprazole (PROTONIX) 40 MG tablet Take 1 tablet (40 mg total) by mouth daily.  30 tablet  11  . potassium chloride SA (K-DUR,KLOR-CON) 20 MEQ tablet Take 2 tablets (40 mEq total) by mouth 2 (two) times daily.  120 tablet  6  . warfarin (COUMADIN) 7.5 MG tablet Take as directed by coumadin clinic  40 tablet  3   No current facility-administered medications on file prior to visit.    Allergies:  Allergies  Allergen Reactions  . Iodine     REACTION: anaphylaxis    ROS:  Out of a complete 14 system review of symptoms, the patient complains only of the following symptoms, and all other reviewed systems are negative.  Weight gain  Fatigue Swelling in the legs Dizziness Difficulty swallowing Blurring of vision Dyspnea on exertion Incontinence of bladder Muscle cramps Memory loss, confusion Numbness, weakness Insomnia, decreased energy  Blood pressure 133/89, pulse 89, height 5' 11.25" (1.81 m), weight 260 lb (117.935 kg).  Physical Exam  General: The patient is alert and cooperative at the time of the examination.  Head: Pupils are equal, round, and reactive to light. Discs are flat bilaterally.  Neck: The neck is supple, no carotid bruits are noted.  Respiratory: The respiratory examination is clear.  Cardiovascular: The cardiovascular examination reveals a regular rate and rhythm, no obvious murmurs or rubs are noted.  Skin: Extremities are without significant edema.  Neurologic Exam  Mental status:  Cranial nerves: Facial symmetry is present. There is good sensation of the face to pinprick and soft touch bilaterally. The strength of the facial muscles and the muscles to head turning and shoulder shrug are normal bilaterally. Speech is well enunciated, no aphasia or dysarthria is noted. Extraocular movements are full. Visual fields are full. The patient demonstrates ineffective ability to read, the patient is able to write however.   Motor: The motor  testing reveals 5 over 5 strength of all 4 extremities, but there is some giveaway weakness of the left leg, no true weakness seen . Good symmetric motor tone is noted throughout.  Sensory: Sensory testing is intact to pinprick, soft touch, vibration sensation, and position sense  on the right extremities. On the left, the patient has decreased pinprick sensation, vibration sensation, and position sensation. With position sensation testing, the patient gives "opposites".No evidence of extinction is noted.  Coordination: Cerebellar testing reveals good finger-nose-finger and heel-to-shin bilaterally.  Gait and station: Gait is notable for a limping type gait on the left leg. Tandem gait is normal. Romberg is negative. No drift is seen.  Reflexes: Deep tendon reflexes are symmetric,  but are depressed bilaterally. Toes are downgoing bilaterally.   Assessment/Plan:   One. Episodic left hemiparesis, likely psychogenic  2. History of right brain stroke in 2011  3. Hypertension  4. Coronary artery disease  The patient has had a recent evaluation with MRI of the brain, and a carotid doppler studies that were unremarkable. In the past, MRA of the head and neck have been relatively unremarkable. The patient has less than 50% stenosis of the right internal carotid artery, the left internal carotid artery is unremarkable. The patient is on Coumadin, and he indicates that he is falling frequently, but he has not sustained any significant injury. The neurologic examination and clinical history is unusual. The patient has had a right brain stroke, and he is right-handed, and he indicates that he has developed a problem with alexia. The patient however, is able to speak normally, and he is able to perform handwriting. The patient has giveaway weakness on clinical examination, and he has "opposites" with position testing on the left arm and leg.  My clinical impression is that the reading deficits, and a  left-sided hemiparesis and sensory changes are likely to be psychogenic. The patient will be sent for EEG evaluation to exclude the possibility of epilepsy. Otherwise if this study is unremarkable, no further neurologic evaluation is needed. A right brain stroke would not result in his current clinical picture. Review of MRI studies done reveals a minimal small vessel disease, and a very chronic right caudate infarct. The patient will followup through this office if needed.  Marlan Palau MD 03/22/2013 5:30 PM  [facility] [address] [place], [place] [address]  Phone [phone] Fax [phone]

## 2013-03-23 ENCOUNTER — Ambulatory Visit (HOSPITAL_COMMUNITY)
Admission: RE | Admit: 2013-03-23 | Discharge: 2013-03-23 | Disposition: A | Discharge status: Home / Self Care | Payer: BC Managed Care – PPO | Source: Ambulatory Visit | Attending: Internal Medicine | Admitting: Internal Medicine

## 2013-03-23 DIAGNOSIS — R059 Cough, unspecified: Secondary | ICD-10-CM | POA: Insufficient documentation

## 2013-03-23 DIAGNOSIS — J988 Other specified respiratory disorders: Secondary | ICD-10-CM | POA: Insufficient documentation

## 2013-03-23 DIAGNOSIS — R0989 Other specified symptoms and signs involving the circulatory and respiratory systems: Secondary | ICD-10-CM | POA: Insufficient documentation

## 2013-03-23 DIAGNOSIS — R0600 Dyspnea, unspecified: Secondary | ICD-10-CM

## 2013-03-23 DIAGNOSIS — R062 Wheezing: Secondary | ICD-10-CM | POA: Insufficient documentation

## 2013-03-23 DIAGNOSIS — R0602 Shortness of breath: Secondary | ICD-10-CM | POA: Insufficient documentation

## 2013-03-23 DIAGNOSIS — R0609 Other forms of dyspnea: Secondary | ICD-10-CM | POA: Insufficient documentation

## 2013-03-23 DIAGNOSIS — R05 Cough: Secondary | ICD-10-CM | POA: Insufficient documentation

## 2013-03-23 LAB — PULMONARY FUNCTION TEST

## 2013-03-23 MED ORDER — ALBUTEROL SULFATE (5 MG/ML) 0.5% IN NEBU
2.5000 mg | INHALATION_SOLUTION | Freq: Once | RESPIRATORY_TRACT | Status: AC
  Administered 2013-03-23: 2.5 mg via RESPIRATORY_TRACT

## 2013-03-24 ENCOUNTER — Other Ambulatory Visit: Payer: BC Managed Care – PPO

## 2013-03-24 ENCOUNTER — Other Ambulatory Visit: Payer: Self-pay | Admitting: Internal Medicine

## 2013-03-24 DIAGNOSIS — J3489 Other specified disorders of nose and nasal sinuses: Secondary | ICD-10-CM

## 2013-03-24 NOTE — Progress Notes (Signed)
Result Notes    Notes Recorded by Sherre Lain, MA on 03/24/2013 at 11:16 AM Called spoke with patient, advised of CT results / recs as stated by CY. Pt okay with ENT referral. Orders only encounter created for referral. ------  Notes Recorded by Abigail Miyamoto, RN on 03/20/2013 at 11:13 AM lmtcb x 1 ------  Notes Recorded by Waymon Budge, MD on 03/19/2013 at 5:08 PM Limited CT scan of sinuses suggests narrowing in the nose like what he complains of. I suggest referral to [facility] ENT for nasal obstruction on CT.

## 2013-03-24 NOTE — Progress Notes (Signed)
Quick Note:  Called spoke with patient, advised of CT results / recs as stated by CY. Pt okay with ENT referral. Orders only encounter created for referral. ______

## 2013-03-25 DIAGNOSIS — J310 Chronic rhinitis: Secondary | ICD-10-CM | POA: Insufficient documentation

## 2013-03-25 DIAGNOSIS — Z87891 Personal history of nicotine dependence: Secondary | ICD-10-CM | POA: Insufficient documentation

## 2013-03-25 DIAGNOSIS — G4700 Insomnia, unspecified: Secondary | ICD-10-CM | POA: Insufficient documentation

## 2013-03-25 NOTE — Assessment & Plan Note (Signed)
At times this can interfere with his ability to wear his CPAP. Plan Limited CT of sinuses.

## 2013-03-25 NOTE — Assessment & Plan Note (Signed)
Unclear if chronic lung disease might contribute to his sleep fragmentation. Plan-overnight oximetry, schedule PFT

## 2013-03-25 NOTE — Assessment & Plan Note (Signed)
Plan-overnight oximetry. Limited CT of sinuses. We'll try to improve his nasal airway see if we can improve CPAP tolerance.we will probably want to try CPAP again with more explanation but he might be a candidate for an oral appliance.

## 2013-03-25 NOTE — Assessment & Plan Note (Signed)
Difficulty initiating and maintaining sleep. Not yet clear if this is secondary to his sleep apnea or an independent problem. Plan-emphasis first on sleep habits and environment as we get better control of his sleep apnea.

## 2013-04-14 ENCOUNTER — Ambulatory Visit (HOSPITAL_BASED_OUTPATIENT_CLINIC_OR_DEPARTMENT_OTHER): Payer: BC Managed Care – PPO | Attending: Internal Medicine | Admitting: Radiology

## 2013-04-14 VITALS — Ht 71.0 in | Wt 262.0 lb

## 2013-04-14 DIAGNOSIS — Z9989 Dependence on other enabling machines and devices: Secondary | ICD-10-CM

## 2013-04-14 DIAGNOSIS — G4733 Obstructive sleep apnea (adult) (pediatric): Secondary | ICD-10-CM

## 2013-04-14 DIAGNOSIS — R0600 Dyspnea, unspecified: Secondary | ICD-10-CM

## 2013-04-14 DIAGNOSIS — G4710 Hypersomnia, unspecified: Secondary | ICD-10-CM | POA: Insufficient documentation

## 2013-04-14 DIAGNOSIS — G4730 Sleep apnea, unspecified: Secondary | ICD-10-CM | POA: Insufficient documentation

## 2013-04-17 ENCOUNTER — Ambulatory Visit (INDEPENDENT_AMBULATORY_CARE_PROVIDER_SITE_OTHER): Payer: BC Managed Care – PPO

## 2013-04-17 DIAGNOSIS — Z7901 Long term (current) use of anticoagulants: Secondary | ICD-10-CM

## 2013-04-17 DIAGNOSIS — I6350 Cerebral infarction due to unspecified occlusion or stenosis of unspecified cerebral artery: Secondary | ICD-10-CM

## 2013-04-17 DIAGNOSIS — I82409 Acute embolism and thrombosis of unspecified deep veins of unspecified lower extremity: Secondary | ICD-10-CM

## 2013-04-18 DIAGNOSIS — G4710 Hypersomnia, unspecified: Secondary | ICD-10-CM

## 2013-04-18 DIAGNOSIS — G4730 Sleep apnea, unspecified: Secondary | ICD-10-CM

## 2013-04-18 NOTE — Procedures (Signed)
NAMEJAICOB, DIA               ACCOUNT NO.:  [id number]  MEDICAL RECORD NO.:  [id number]          PATIENT TYPE:  OUT  LOCATION:  SLEEP CENTER                 FACILITY:  [facility]  PHYSICIAN:  Clinton D. Maple Hudson, MD, FCCP, FACPDATE OF BIRTH:  1963/08/12  DATE OF STUDY:  04/14/2013                           NOCTURNAL POLYSOMNOGRAM  REFERRING PHYSICIAN:  Clinton D. Young, MD, FCCP, FACP  INDICATION FOR STUDY:  Hypersomnia with sleep apnea.  EPWORTH SLEEPINESS SCORE:  22/24.  BMI 36.5.  Weight 262 pounds, height 71 inches.  Neck 18 inches.  MEDICATIONS:  Home medications charted and reviewed.  A diagnostic sleep study had been done elsewhere.  CPAP titration is requested.  SLEEP ARCHITECTURE:  Total sleep time 370 minutes with sleep efficiency 92.5%.  Stage I was 3.4%, stage II 66.1%, stage III absent, REM 30.5% of total sleep time.  Sleep latency 1 minute.  REM latency 73 minutes. Awake after sleep onset 29 minutes.  Arousal index 7.9.  Bedtime medication:  None.  RESPIRATORY DATA:  CPAP titration protocol.  CPAP was titrated to 23 CWP, AHI 0 per hour.  AHI was 0 for all pressures at 20 and higher.  He wore a medium ResMed AirFit F10 full face mask with heated humidifier.  OXYGEN DATA:  Moderately loud snoring was suppressed at final CPAP pressures, with mean oxygen saturation 94.2% on room air.  CARDIAC DATA:  Sinus rhythm with frequent PACs.  MOVEMENT/PARASOMNIA:  Rare limb jerk with no effect on sleep. Bathroom x1.  IMPRESSION/RECOMMENDATION: 1. CPAP was titrated to 23 CWP, AHI 0 per hour.  Obstructive events     were completely prevented at pressure of 20 and higher but snoring     persisted until final pressure setting.  He wore a medium ResMed     AirFit F10 full     face mask with heated humidifier. 2. Prior diagnostic NPSG was performed elsewhere.     Clinton D. Maple Hudson, MD, [facility], FACP Diplomate, American Board of Sleep Medicine    CDY/MEDQ  D:  04/18/2013  13:43:56  T:  04/18/2013 19:29:00  Job:  [id number]

## 2013-04-22 ENCOUNTER — Emergency Department (HOSPITAL_COMMUNITY): Payer: BC Managed Care – PPO

## 2013-04-22 ENCOUNTER — Other Ambulatory Visit: Payer: Self-pay

## 2013-04-22 ENCOUNTER — Inpatient Hospital Stay (HOSPITAL_COMMUNITY)
Admission: EM | Admit: 2013-04-22 | Discharge: 2013-04-25 | DRG: 125 | Disposition: A | Discharge status: Home / Self Care | Payer: BC Managed Care – PPO | Attending: Cardiovascular Disease | Admitting: Cardiovascular Disease

## 2013-04-22 ENCOUNTER — Encounter (HOSPITAL_COMMUNITY): Payer: Self-pay | Admitting: General Practice

## 2013-04-22 DIAGNOSIS — Z8673 Personal history of transient ischemic attack (TIA), and cerebral infarction without residual deficits: Secondary | ICD-10-CM | POA: Diagnosis not present

## 2013-04-22 DIAGNOSIS — Q2111 Secundum atrial septal defect: Secondary | ICD-10-CM

## 2013-04-22 DIAGNOSIS — I10 Essential (primary) hypertension: Secondary | ICD-10-CM | POA: Diagnosis present

## 2013-04-22 DIAGNOSIS — E785 Hyperlipidemia, unspecified: Secondary | ICD-10-CM | POA: Diagnosis present

## 2013-04-22 DIAGNOSIS — R079 Chest pain, unspecified: Secondary | ICD-10-CM

## 2013-04-22 DIAGNOSIS — Q211 Atrial septal defect: Secondary | ICD-10-CM

## 2013-04-22 DIAGNOSIS — Z9861 Coronary angioplasty status: Secondary | ICD-10-CM | POA: Diagnosis not present

## 2013-04-22 DIAGNOSIS — G4733 Obstructive sleep apnea (adult) (pediatric): Secondary | ICD-10-CM | POA: Diagnosis present

## 2013-04-22 DIAGNOSIS — Z79899 Other long term (current) drug therapy: Secondary | ICD-10-CM | POA: Diagnosis not present

## 2013-04-22 DIAGNOSIS — Z7901 Long term (current) use of anticoagulants: Secondary | ICD-10-CM | POA: Diagnosis not present

## 2013-04-22 DIAGNOSIS — E669 Obesity, unspecified: Secondary | ICD-10-CM | POA: Diagnosis present

## 2013-04-22 DIAGNOSIS — I82409 Acute embolism and thrombosis of unspecified deep veins of unspecified lower extremity: Secondary | ICD-10-CM | POA: Diagnosis present

## 2013-04-22 DIAGNOSIS — Z86718 Personal history of other venous thrombosis and embolism: Secondary | ICD-10-CM | POA: Diagnosis not present

## 2013-04-22 DIAGNOSIS — I200 Unstable angina: Secondary | ICD-10-CM

## 2013-04-22 DIAGNOSIS — I2510 Atherosclerotic heart disease of native coronary artery without angina pectoris: Secondary | ICD-10-CM | POA: Diagnosis present

## 2013-04-22 DIAGNOSIS — K219 Gastro-esophageal reflux disease without esophagitis: Secondary | ICD-10-CM | POA: Diagnosis present

## 2013-04-22 DIAGNOSIS — Z87891 Personal history of nicotine dependence: Secondary | ICD-10-CM

## 2013-04-22 DIAGNOSIS — Z6837 Body mass index (BMI) 37.0-37.9, adult: Secondary | ICD-10-CM

## 2013-04-22 DIAGNOSIS — I209 Angina pectoris, unspecified: Secondary | ICD-10-CM | POA: Diagnosis present

## 2013-04-22 HISTORY — DX: Rheumatoid arthritis, unspecified: M06.9

## 2013-04-22 LAB — COMPREHENSIVE METABOLIC PANEL
ALT: 16 U/L (ref 0–53)
Albumin: 3.5 g/dL (ref 3.5–5.2)
Alkaline Phosphatase: 97 U/L (ref 39–117)
BUN: 21 mg/dL (ref 6–23)
BUN: 21 mg/dL (ref 6–23)
Calcium: 9.4 mg/dL (ref 8.4–10.5)
Chloride: 103 mEq/L (ref 96–112)
Creatinine, Ser: 1.03 mg/dL (ref 0.50–1.35)
GFR calc Af Amer: >90 mL/min (ref >90)
Glucose, Bld: 125 mg/dL — ABNORMAL HIGH (ref 70–99)
Glucose, Bld: 119 mg/dL — ABNORMAL HIGH (ref 70–99)
Potassium: 3.6 mEq/L (ref 3.5–5.1)
Sodium: 142 mEq/L (ref 135–145)
Total Bilirubin: 0.4 mg/dL (ref 0.3–1.2)
Total Protein: 6.8 g/dL (ref 6.0–8.3)

## 2013-04-22 LAB — CBC WITH DIFFERENTIAL/PLATELET
Basophils Absolute: 0 10*3/uL (ref 0.0–0.1)
Eosinophils Absolute: 0.3 10*3/uL (ref 0.0–0.7)
Eosinophils Relative: 4 % (ref 0–5)
HCT: 39.7 % (ref 39.0–52.0)
Hemoglobin: 13.3 g/dL (ref 13.0–17.0)
Hemoglobin: 13.1 g/dL (ref 13.0–17.0)
Lymphocytes Relative: 37 % (ref 12–46)
Lymphs Abs: 2.5 10*3/uL (ref 0.7–4.0)
MCH: 28.8 pg (ref 26.0–34.0)
MCHC: 33 g/dL (ref 30.0–36.0)
MCV: 87.2 fL (ref 78.0–100.0)
Monocytes Absolute: 0.5 10*3/uL (ref 0.1–1.0)
Monocytes Relative: 5 % (ref 3–12)
Monocytes Relative: 8 % (ref 3–12)
Neutro Abs: 3.4 10*3/uL (ref 1.7–7.7)
Neutrophils Relative %: 50 % (ref 43–77)
RBC: 4.62 MIL/uL (ref 4.22–5.81)
RBC: 4.53 MIL/uL (ref 4.22–5.81)
WBC: 6.9 10*3/uL (ref 4.0–10.5)

## 2013-04-22 LAB — PROTIME-INR
INR: 2.21 — ABNORMAL HIGH (ref 0.00–1.49)
INR: 2.37 — ABNORMAL HIGH (ref 0.00–1.49)
Prothrombin Time: 23.6 seconds — ABNORMAL HIGH (ref 11.6–15.2)

## 2013-04-22 LAB — APTT
aPTT: 52 seconds — ABNORMAL HIGH (ref 24–37)
aPTT: 57 seconds — ABNORMAL HIGH (ref 24–37)

## 2013-04-22 LAB — MAGNESIUM: Magnesium: 1.9 mg/dL (ref 1.5–2.5)

## 2013-04-22 LAB — TROPONIN I: Troponin I: <0.3 ng/mL (ref <0.30)

## 2013-04-22 LAB — HEMOGLOBIN A1C: Mean Plasma Glucose: 131 mg/dL — ABNORMAL HIGH (ref <117)

## 2013-04-22 MED ORDER — SODIUM CHLORIDE 0.9 % IV SOLN: INTRAVENOUS | Status: DC

## 2013-04-22 MED ORDER — CARVEDILOL 12.5 MG PO TABS
12.5000 mg | ORAL_TABLET | Freq: Two times a day (BID) | ORAL | Status: DC
  Administered 2013-04-22 – 2013-04-25 (×6): 12.5 mg via ORAL
  Filled 2013-04-22 (×10): qty 1

## 2013-04-22 MED ORDER — NITROGLYCERIN 0.4 MG SL SUBL: 0.4000 mg | SUBLINGUAL_TABLET | SUBLINGUAL | Status: DC | PRN

## 2013-04-22 MED ORDER — PANTOPRAZOLE SODIUM 40 MG PO TBEC
40.0000 mg | DELAYED_RELEASE_TABLET | Freq: Every day | ORAL | Status: DC
  Administered 2013-04-22 – 2013-04-25 (×4): 40 mg via ORAL
  Filled 2013-04-22 (×4): qty 1

## 2013-04-22 MED ORDER — ACETAMINOPHEN 325 MG PO TABS
650.0000 mg | ORAL_TABLET | ORAL | Status: DC | PRN
  Administered 2013-04-23 – 2013-04-25 (×4): 650 mg via ORAL
  Filled 2013-04-22 (×4): qty 2

## 2013-04-22 MED ORDER — ASPIRIN EC 81 MG PO TBEC
81.0000 mg | DELAYED_RELEASE_TABLET | Freq: Every day | ORAL | Status: DC
  Administered 2013-04-23 – 2013-04-25 (×2): 81 mg via ORAL
  Filled 2013-04-22 (×3): qty 1

## 2013-04-22 MED ORDER — ONDANSETRON HCL 4 MG/2ML IJ SOLN: 4.0000 mg | Freq: Four times a day (QID) | INTRAMUSCULAR | Status: DC | PRN

## 2013-04-22 MED ORDER — LOSARTAN POTASSIUM 50 MG PO TABS
100.0000 mg | ORAL_TABLET | Freq: Every day | ORAL | Status: DC
  Administered 2013-04-22 – 2013-04-25 (×4): 100 mg via ORAL
  Filled 2013-04-22 (×4): qty 2

## 2013-04-22 MED ORDER — NITROGLYCERIN IN D5W 200-5 MCG/ML-% IV SOLN
5.0000 ug/min | INTRAVENOUS | Status: DC
  Administered 2013-04-22 (×2): 5 ug/min via INTRAVENOUS
  Filled 2013-04-22: qty 250

## 2013-04-22 MED ORDER — POTASSIUM CHLORIDE CRYS ER 20 MEQ PO TBCR
40.0000 meq | EXTENDED_RELEASE_TABLET | Freq: Once | ORAL | Status: AC
  Administered 2013-04-22: 40 meq via ORAL

## 2013-04-22 MED ORDER — FOLIC ACID 1 MG PO TABS
1.0000 mg | ORAL_TABLET | Freq: Every day | ORAL | Status: DC
  Administered 2013-04-22 – 2013-04-25 (×4): 1 mg via ORAL
  Filled 2013-04-22 (×4): qty 1

## 2013-04-22 MED ORDER — POTASSIUM CHLORIDE CRYS ER 20 MEQ PO TBCR
40.0000 meq | EXTENDED_RELEASE_TABLET | Freq: Two times a day (BID) | ORAL | Status: DC
  Administered 2013-04-22 – 2013-04-25 (×7): 40 meq via ORAL
  Filled 2013-04-22 (×9): qty 2

## 2013-04-22 MED ORDER — ONDANSETRON HCL 4 MG/2ML IJ SOLN
4.0000 mg | Freq: Once | INTRAMUSCULAR | Status: AC
  Administered 2013-04-22: 4 mg via INTRAVENOUS
  Filled 2013-04-22: qty 2

## 2013-04-22 MED ORDER — MORPHINE SULFATE 4 MG/ML IJ SOLN
4.0000 mg | Freq: Once | INTRAMUSCULAR | Status: AC
  Administered 2013-04-22: 4 mg via INTRAVENOUS
  Filled 2013-04-22: qty 1

## 2013-04-22 MED ORDER — POTASSIUM CHLORIDE CRYS ER 20 MEQ PO TBCR
40.0000 meq | EXTENDED_RELEASE_TABLET | Freq: Once | ORAL | Status: AC
  Administered 2013-04-22: 40 meq via ORAL
  Filled 2013-04-22: qty 2

## 2013-04-22 MED ORDER — ASPIRIN 300 MG RE SUPP
300.0000 mg | RECTAL | Status: AC
  Filled 2013-04-22: qty 1

## 2013-04-22 MED ORDER — ATORVASTATIN CALCIUM 80 MG PO TABS
80.0000 mg | ORAL_TABLET | Freq: Every day | ORAL | Status: DC
  Administered 2013-04-22 – 2013-04-24 (×3): 80 mg via ORAL
  Filled 2013-04-22 (×4): qty 1

## 2013-04-22 MED ORDER — NITROGLYCERIN IN D5W 200-5 MCG/ML-% IV SOLN
2.0000 ug/min | INTRAVENOUS | Status: DC
  Administered 2013-04-22: 5 ug/min via INTRAVENOUS

## 2013-04-22 MED ORDER — ASPIRIN 81 MG PO CHEW
324.0000 mg | CHEWABLE_TABLET | ORAL | Status: AC
  Administered 2013-04-22: 324 mg via ORAL
  Filled 2013-04-22: qty 4

## 2013-04-22 NOTE — ED Notes (Signed)
Dr. Kelly with Cardiology at bedside. 

## 2013-04-22 NOTE — Progress Notes (Signed)
Patient asking for CPAP. He states he does use one at home, but he does not tolerate the mask well. He wishes to use the same type of apparatus that was used during his overnight sleep study, which he states was one week ago. He refuses to use one of the hospital supplied masks. RT has informed him that he may supply his own mask if he chooses to do so. Despite that he requests the use of CPAP tonight, he will remain off for now due to the refusal to use hospital supplies. RN aware.

## 2013-04-22 NOTE — ED Notes (Signed)
**   see DOWNTIME paper documentation ** for previous charting on pt arrival, initial tx and orders.

## 2013-04-22 NOTE — H&P (Addendum)
Tony Davis is an 52 y.o. male.   Chief Complaint: chest pain HPI: 52 yo man with PMH of dyslipidemia, PFO, prior CVA, DVT and CAD with prior stent 2 years ago seen in the last several days by Dr. Clifton James who comes in with CP starting at 23:00. He describes the pain as substernal, no radiation, pressure sensation. His last known EF 4/13 TTE 55%, moderate DD, TEE 4/13 with + bubble (and +bubble TTE) c/w small PFO. He has quit tobacco and he has OSA and has been resistant to usign his CPAP. He is tolerating his warfarin without signficant bleeding and INR 2.2 today. Chest pain nearly resolved with low dose IV nitroglycerin.       Past Medical History  Diagnosis Date  . GERD (gastroesophageal reflux disease)   . HLD (hyperlipidemia)   . HTN (hypertension)   . Tobacco abuse   . CVA (cerebral vascular accident) 3/11  . TIA (transient ischemic attack) 3/11  . Hematuria 8/12  . Sleep apnea     states he does not use CPAP  . Arthritis   . PFO (patent foramen ovale)     echo 8/12: EF 55-65%, grade 2 diast dysfnx; +PFO on bubble study;  TEE 4/13: EF normal, atrial septum with suspicion for interatrial septum fenestrations without flow across and few large bubbles noted in LA.  This was not felt to require closure   . CAD (coronary artery disease)     cath 10/11 with mild LAD, 80% mid circ, PLA 70%. no s/p promus DES circumflex artery 10/13/10  . DVT (deep venous thrombosis)   . Hemiparesis due to old cerebral infarction 03/20/2013    Past Surgical History  Procedure Laterality Date  . Knee arthroscopy      2 on left and 3 on rt  . Coronary angioplasty with stent placement    . Tee without cardioversion  03/21/2012    Procedure: TRANSESOPHAGEAL ECHOCARDIOGRAM (TEE);  Surgeon: Pricilla Riffle, MD;  Location: [facility] ENDOSCOPY;  Service: Cardiovascular;  Laterality: N/A;    Family History  Problem Relation Age of Onset  . Cancer Neg Hx   . Kidney disease Neg Hx   . Diabetes Neg Hx     Social History:  reports that he quit smoking about 13 months ago. His smoking use included Cigarettes. He has a 26 pack-year smoking history. He has never used smokeless tobacco. He reports that he does not drink alcohol or use illicit drugs.  Allergies:  Allergies  Allergen Reactions  . Iodine     REACTION: anaphylaxis     (Not in a hospital admission) Current Facility-Administered Medications  Medication Dose Route Frequency Provider Last Rate Last Dose  . nitroGLYCERIN 0.2 mg/mL in dextrose 5 % infusion  5 mcg/min Intravenous Titrated Olivia Mackie, MD 1.5 mL/hr at 04/22/13 0427 5 mcg/min at 04/22/13 4098   Current Outpatient Prescriptions  Medication Sig Dispense Refill  . amLODipine (NORVASC) 10 MG tablet TAKE 1 TABLET BY MOUTH DAILY  30 tablet  3  . atorvastatin (LIPITOR) 80 MG tablet Take 1 tablet (80 mg total) by mouth at bedtime.  90 tablet  2  . carvedilol (COREG) 12.5 MG tablet Take 1 tablet (12.5 mg total) by mouth 2 (two) times daily.  60 tablet  6  . folic acid (FOLVITE) 1 MG tablet Take 1 tablet (1 mg total) by mouth daily.  90 tablet  3  . losartan (COZAAR) 100 MG tablet TAKE 1 TABLET BY  MOUTH DAILY  30 tablet  2  . nitroGLYCERIN (NITROSTAT) 0.4 MG SL tablet Place 1 tablet (0.4 mg total) under the tongue every 5 (five) minutes x 3 doses as needed for chest pain.  25 tablet  3  . pantoprazole (PROTONIX) 40 MG tablet Take 1 tablet (40 mg total) by mouth daily.  30 tablet  11  . potassium chloride SA (K-DUR,KLOR-CON) 20 MEQ tablet Take 2 tablets (40 mEq total) by mouth 2 (two) times daily.  120 tablet  6  . warfarin (COUMADIN) 7.5 MG tablet Take 7.5 mg by mouth daily.        Results for orders placed during the hospital encounter of 04/22/13 (from the past 48 hour(s))  CBC WITH DIFFERENTIAL     Status: None   Collection Time    04/22/13  2:00 AM      Result Value Range   WBC 9.4  4.0 - 10.5 K/uL   RBC 4.62  4.22 - 5.81 MIL/uL   Hemoglobin 13.3  13.0 - 17.0 g/dL    HCT 16.1  09.6 - 04.5 %   MCV 87.2  78.0 - 100.0 fL   MCH 28.8  26.0 - 34.0 pg   MCHC 33.0  30.0 - 36.0 g/dL   RDW 40.9  81.1 - 91.4 %   Platelets 312  150 - 400 K/uL   Neutrophils Relative % 64  43 - 77 %   Neutro Abs 6.1  1.7 - 7.7 K/uL   Lymphocytes Relative 26  12 - 46 %   Lymphs Abs 2.5  0.7 - 4.0 K/uL   Monocytes Relative 5  3 - 12 %   Monocytes Absolute 0.5  0.1 - 1.0 K/uL   Eosinophils Relative 4  0 - 5 %   Eosinophils Absolute 0.3  0.0 - 0.7 K/uL   Basophils Relative 0  0 - 1 %   Basophils Absolute 0.0  0.0 - 0.1 K/uL  COMPREHENSIVE METABOLIC PANEL     Status: Abnormal   Collection Time    04/22/13  2:00 AM      Result Value Range   Sodium 140  135 - 145 mEq/L   Potassium 3.0 (*) 3.5 - 5.1 mEq/L   Chloride 101  96 - 112 mEq/L   CO2 27  19 - 32 mEq/L   Glucose, Bld 125 (*) 70 - 99 mg/dL   BUN 21  6 - 23 mg/dL   Creatinine, Ser 7.82  0.50 - 1.35 mg/dL   Calcium 9.4  8.4 - 95.6 mg/dL   Total Protein 6.8  6.0 - 8.3 g/dL   Albumin 3.2 (*) 3.5 - 5.2 g/dL   AST 16  0 - 37 U/L   ALT 17  0 - 53 U/L   Alkaline Phosphatase 96  39 - 117 U/L   Total Bilirubin 0.3  0.3 - 1.2 mg/dL   GFR calc non Af Amer 84 (*) >90 mL/min   GFR calc Af Amer >90  >90 mL/min   Comment:            The eGFR has been calculated     using the CKD EPI equation.     This calculation has not been     validated in all clinical     situations.     eGFR's persistently     <90 mL/min signify     possible Chronic Kidney Disease.  PROTIME-INR     Status: Abnormal   Collection Time  04/22/13  2:00 AM      Result Value Range   Prothrombin Time 23.6 (*) 11.6 - 15.2 seconds   INR 2.21 (*) 0.00 - 1.49  APTT     Status: Abnormal   Collection Time    04/22/13  2:00 AM      Result Value Range   aPTT 52 (*) 24 - 37 seconds   Comment:            IF BASELINE aPTT IS ELEVATED,     SUGGEST PATIENT RISK ASSESSMENT     BE USED TO DETERMINE APPROPRIATE     ANTICOAGULANT THERAPY.  TROPONIN I     Status: None    Collection Time    04/22/13  2:00 AM      Result Value Range   Troponin I <0.30  <0.30 ng/mL   Comment:            Due to the release kinetics of cTnI,     a negative result within the first hours     of the onset of symptoms does not rule out     myocardial infarction with certainty.     If myocardial infarction is still suspected,     repeat the test at appropriate intervals.   No results found.  Review of Systems  Constitutional: Positive for weight loss. Negative for fever, chills and diaphoresis.  HENT: Negative for hearing loss, neck pain and tinnitus.   Eyes: Negative for blurred vision, double vision and photophobia.  Respiratory: Negative for cough, hemoptysis and sputum production.   Cardiovascular: Positive for chest pain and leg swelling. Negative for palpitations and orthopnea.  Gastrointestinal: Negative for heartburn, nausea and vomiting.  Genitourinary: Negative for dysuria, urgency and frequency.  Musculoskeletal: Negative for myalgias and back pain.  Skin: Negative for itching and rash.  Neurological: Negative for tingling, tremors, sensory change, weakness and headaches.  Endo/Heme/Allergies: Negative for environmental allergies and polydipsia.  Psychiatric/Behavioral: Negative for depression, suicidal ideas and substance abuse.    Blood pressure 116/67, pulse 72, resp. rate 14, SpO2 91.00%. Physical Exam  Nursing note and vitals reviewed. Constitutional: He is oriented to person, place, and time. He appears well-developed and well-nourished. No distress.  HENT:  Head: Normocephalic and atraumatic.  Mouth/Throat: No oropharyngeal exudate.  Eyes: Conjunctivae and EOM are normal. Pupils are equal, round, and reactive to light. No scleral icterus.  Neck: Normal range of motion. Neck supple. No JVD present. No tracheal deviation present. No thyromegaly present.  Cardiovascular: Normal rate, regular rhythm, normal heart sounds and intact distal pulses.  Exam  reveals no gallop.   No murmur heard. GI: Soft. Bowel sounds are normal. He exhibits no distension. There is no tenderness. There is no rebound.  Musculoskeletal: Normal range of motion. He exhibits no edema and no tenderness.  Neurological: He is alert and oriented to person, place, and time. No cranial nerve deficit. Coordination normal.  Skin: Skin is warm and dry. No rash noted. He is not diaphoretic. No erythema.  Psychiatric: He has a normal mood and affect. His behavior is normal.   K 3.0, bun/cr 21/1, inr 2, troponin <0.3, plt 312, h/h 13/40 ECG HR 82, NSR  Problem List Chest Pain/Unstable angina Coronary artery disease with prior stent DVT/CVA on warfarin inr 2.21 PFO Prior tobacco use Dyslipidemia GERD Hypertension OSA  Assessment/Plan 52 yo man with CAD, prior tobacco use, hypertension, DVT/CVA on warfarin, INR 2.21 here with unstable angina. He is NPO. We discussed likely  clinical course of LHC, possibly today. Will not use heparin given therapeutic on warfarin with INR 2.21. Aspirin 324 mg.  - NPO - IV NTG gtt for pain - lipid panel, update other labs - beta-blocker, high dose statin (coreg, atorvastatin) - warfarin will not be written for so if continued will need to be written for tonight - no echo ordered given possible LV-gram - likely LHC today or tomorrow - on telemetry, trend troponins - losartan ordered; amlodipine held given IV NTG - 40 meq ordered in ER, 40 meq PO bid also ordered - PPI ordered  Jomaira Darr 04/22/2013, 7:31 AM

## 2013-04-22 NOTE — Progress Notes (Addendum)
52 yo male with hx CAD on coumadin for DVT in 2013 and hx TIA as well. He took coumadin last pm before coming to ER.  Currently pain-free on IV NTG, no heparin because of therapeutic INR.   Reviewed data. Pt currently pain-free and initial enzymes negative. Admission orders written by Dr Tresa Endo (fellow) and he was made NPO for possible cath. Coumadin on hold.   Will cancel NPO for now, allow INR to drift down and add heparin once INR < 2.0. Consider cath at that time.   Discussed plan with pt/family and they are in agreement.   Dr Clifton James aware of pt, agrees with plan.

## 2013-04-22 NOTE — ED Notes (Signed)
ntg gtt started, ntg paste REMOVED.

## 2013-04-23 ENCOUNTER — Telehealth: Payer: Self-pay | Admitting: Internal Medicine

## 2013-04-23 DIAGNOSIS — I200 Unstable angina: Secondary | ICD-10-CM

## 2013-04-23 LAB — CBC
HCT: 40.4 % (ref 39.0–52.0)
Hemoglobin: 13.2 g/dL (ref 13.0–17.0)
MCV: 88.8 fL (ref 78.0–100.0)
RBC: 4.55 MIL/uL (ref 4.22–5.81)
RDW: 13 % (ref 11.5–15.5)
WBC: 8.1 10*3/uL (ref 4.0–10.5)

## 2013-04-23 LAB — LIPID PANEL
HDL: 27 mg/dL — ABNORMAL LOW (ref >39)
LDL Cholesterol: 179 mg/dL — ABNORMAL HIGH (ref 0–99)
Total CHOL/HDL Ratio: 8.9 RATIO
Triglycerides: 170 mg/dL — ABNORMAL HIGH (ref <150)
VLDL: 34 mg/dL (ref 0–40)

## 2013-04-23 LAB — BASIC METABOLIC PANEL
BUN: 18 mg/dL (ref 6–23)
CO2: 29 mEq/L (ref 19–32)
Chloride: 104 mEq/L (ref 96–112)
Creatinine, Ser: 1.07 mg/dL (ref 0.50–1.35)
Glucose, Bld: 135 mg/dL — ABNORMAL HIGH (ref 70–99)

## 2013-04-23 MED ORDER — DIAZEPAM 5 MG PO TABS
5.0000 mg | ORAL_TABLET | ORAL | Status: AC
  Administered 2013-04-24: 5 mg via ORAL
  Filled 2013-04-23: qty 1

## 2013-04-23 MED ORDER — SODIUM CHLORIDE 0.9 % IJ SOLN: 3.0000 mL | Freq: Two times a day (BID) | INTRAMUSCULAR | Status: DC

## 2013-04-23 MED ORDER — PHYTONADIONE 5 MG PO TABS
2.5000 mg | ORAL_TABLET | Freq: Once | ORAL | Status: AC
  Administered 2013-04-23: 2.5 mg via ORAL
  Filled 2013-04-23: qty 1

## 2013-04-23 MED ORDER — SODIUM CHLORIDE 0.9 % IJ SOLN: 3.0000 mL | INTRAMUSCULAR | Status: DC | PRN

## 2013-04-23 MED ORDER — SODIUM CHLORIDE 0.9 % IV SOLN: 250.0000 mL | INTRAVENOUS | Status: DC | PRN

## 2013-04-23 MED ORDER — SODIUM CHLORIDE 0.9 % IV SOLN: INTRAVENOUS | Status: DC

## 2013-04-23 NOTE — Progress Notes (Signed)
Patient: Tony Davis / Admit Date: 04/22/2013 / Date of Encounter: 04/23/2013, 12:22 PM   Subjective  Currently pain free, but when he got up to wash up, he felt SOB which has resolved.  Coming in, his chest pain began at 11pm the evening of admission while watching TV, substernally with radiation to his back and some SOB. It was much worse when he got up to walk around. CP lasted about 3-4 hours in total, constant, "like a truck was driving over me." It was eventually relieved with a dose of morphine given at the same time as NTG gtt initiation. He did not try any SL NTG due to h/o headache. He remains on 54mcg/min. Symptoms are similar to prior angina. He also reports recent development of intermittent LEE, which prevents him from putting on his shoes/socks or walking. This is not present today.   Objective   Telemetry: NSR Physical Exam: Filed Vitals:   04/23/13 0800  BP: 146/88  Pulse: 87  Temp: 98.2  Resp: 18   General: Well developed, well nourished overweight AAM in no acute distress. Head: Normocephalic, atraumatic, sclera non-icteric, no xanthomas, nares are without discharge. Neck: Negative for carotid bruits. JVD not elevated. Lungs: Clear bilaterally to auscultation without wheezes, rales, or rhonchi. Breathing is unlabored. Heart: RRR S1 S2 without murmurs, rubs, or gallops.  Abdomen: Soft, non-tender, non-distended with normoactive bowel sounds. No hepatomegaly. No rebound/guarding. No obvious abdominal masses. Msk:  Strength and tone appear normal for age. Extremities: No clubbing or cyanosis. No edema.  Distal pedal pulses are 2+ and equal bilaterally. Neuro: Alert and oriented X 3. Moves all extremities spontaneously. Psych:  Responds to questions appropriately with a normal affect.    Intake/Output Summary (Last 24 hours) at 04/23/13 1222 Last data filed at 04/23/13 0645  Gross per 24 hour  Intake      0 ml  Output    800 ml  Net   -800 ml    Inpatient  Medications:  . aspirin EC  81 mg Oral Daily  . atorvastatin  80 mg Oral QHS  . carvedilol  12.5 mg Oral BID WC  . folic acid  1 mg Oral Daily  . losartan  100 mg Oral Daily  . pantoprazole  40 mg Oral Daily  . potassium chloride SA  40 mEq Oral BID    Labs:  Recent Labs  04/22/13 1005 04/23/13 0525  NA 142 140  K 3.6 4.0  CL 103 104  CO2 31 29  GLUCOSE 119* 135*  BUN 21 18  CREATININE 1.05 1.07  CALCIUM 9.1 9.4  MG 1.9  --     Recent Labs  04/22/13 0200 04/22/13 1005  AST 16 16  ALT 17 16  ALKPHOS 96 97  BILITOT 0.3 0.4  PROT 6.8 6.8  ALBUMIN 3.2* 3.5    Recent Labs  04/22/13 0200 04/22/13 1005 04/23/13 0525  WBC 9.4 6.9 8.1  NEUTROABS 6.1 3.4  --   HGB 13.3 13.1 13.2  HCT 40.3 39.7 40.4  MCV 87.2 87.6 88.8  PLT 312 309 307    Recent Labs  04/22/13 0200 04/22/13 1005 04/22/13 1748  TROPONINI <0.30 <0.30 <0.30   No components found with this basename: POCBNP,   Recent Labs  04/22/13 1005  HGBA1C 6.2*     Radiology/Studies:  Dg Chest 2 View  04/22/2013   *RADIOLOGY REPORT*  Clinical Data: Chest pain, shortness of breath, hypertension.  CHEST - 2 VIEW  Comparison:  None.  Findings: Heart size upper normal.  Mild aortic tortuosity without overt dilatation.  Mild central vascular prominence.  No confluent airspace opacity.  No pleural effusion or pneumothorax.  No acute osseous finding.  IMPRESSION: No focal consolidation or definite radiographic evidence of acute cardiopulmonary process.   Original Report Authenticated By: Jearld Lesch, M.D.     Assessment and Plan  1. Chest pain concerning for [place] with history of known CAD 2. Prior h/o DVT 2013, on Coumadin with therapeutic INR (pt does not know reason for DVT) 3. PFO 4. Prior CVA 5. Dyslipidemia 6. OSA, resistant to CPAP  Enzymes negative despite several hours of chest pain and pBNP WNL, but symptoms remind patient of prior angina. LFTs, CBC normal. INR remains elevated - last dose of  Coumadin was evening of 5/13, suspect we will see downtrend of INR by tomorrow with complete holding of Coumadin. Decision regarding cath following MD assessment. Unclear what to make of intermittent LEE - ? obtain LE dopplers to make sure he doesn't have recurrent DVT on coumadin vs 2D echo.   Signed, Ronie Spies PA-C  Patient seen, examined. Available data reviewed. Agree with findings, assessment, and plan as outlined by Ronie Spies, PA-C. Exam reveals obese AAM in NAD, pleasant. Lungs CTA and heart RRR without murmur, no edema. EKG reviewed and shows no acute changes. Sx's concerning for unstable angina and I agree cardiac cath is appropriate. Will give Vitamin K 2.5 mg PO x 1 and follow INR in the am. Risks, indication, and alternatives to cath and possible PCI reviewed with the patient who understands and agrees to proceed.  Tonny Bollman, M.D. 04/23/2013 3:23 PM

## 2013-04-23 NOTE — Progress Notes (Signed)
ANTICOAGULATION CONSULT NOTE - Initial Consult  Pharmacy Consult for heparin Indication: atrial fibrillation  Allergies  Allergen Reactions  . Iodine     REACTION: anaphylaxis    Patient Measurements: Height: 5\' 11"  (180.3 cm) Weight: 278 lb 12.8 oz (126.463 kg) IBW/kg (Calculated) : 75.3 Heparin Dosing Weight: 103.6kg  Vital Signs: Temp: 98.2 F (36.8 C) (05/15 1439) Temp src: Oral (05/15 1439) BP: 158/93 mmHg (05/15 1730) Pulse Rate: 82 (05/15 1730)  Labs:  Recent Labs  04/22/13 0200 04/22/13 1005 04/22/13 1748 04/23/13 0525  HGB 13.3 13.1  --  13.2  HCT 40.3 39.7  --  40.4  PLT 312 309  --  307  APTT 52* 57*  --   --   LABPROT 23.6* 24.8*  --  24.3*  INR 2.21* 2.37*  --  2.30*  CREATININE 1.03 1.05  --  1.07  TROPONINI <0.30 <0.30 <0.30  --     Estimated Creatinine Clearance: 113.2 ml/min (by C-G formula based on Cr of 1.07).   Medical History: Past Medical History  Diagnosis Date  . GERD (gastroesophageal reflux disease)   . HLD (hyperlipidemia)   . HTN (hypertension)   . Hematuria 8/12  . Sleep apnea     states he does not use CPAP  . PFO (patent foramen ovale)     echo 8/12: EF 55-65%, grade 2 diast dysfnx; +PFO on bubble study;  TEE 4/13: EF normal, atrial septum with suspicion for interatrial septum fenestrations without flow across and few large bubbles noted in LA.  This was not felt to require closure   . CAD (coronary artery disease)     cath 10/11 with mild LAD, 80% mid circ, PLA 70%. no s/p promus DES circumflex artery 10/13/10  . DVT (deep venous thrombosis) ~ 2012    "right" (04/22/2013)  . Hemiparesis due to old cerebral infarction 03/20/2013  . Family history of anesthesia complication     "sister; had hard time waking her up or somethins" (04/22/2013)  . Anginal pain     "that's why I'm here" (04/22/2013)  . Exertional shortness of breath     "sometimes" (04/22/2013)  . CVA (cerebral vascular accident) 02/2010    denies residual after  rehab "for about a year" (04/22/2013)  . TIA (transient ischemic attack)   . Arthritis     "knees" (04/22/2013)  . Rheumatoid arthritis     Medications:  Prescriptions prior to admission  Medication Sig Dispense Refill  . amLODipine (NORVASC) 10 MG tablet TAKE 1 TABLET BY MOUTH DAILY  30 tablet  3  . atorvastatin (LIPITOR) 80 MG tablet Take 1 tablet (80 mg total) by mouth at bedtime.  90 tablet  2  . carvedilol (COREG) 12.5 MG tablet Take 1 tablet (12.5 mg total) by mouth 2 (two) times daily.  60 tablet  6  . folic acid (FOLVITE) 1 MG tablet Take 1 tablet (1 mg total) by mouth daily.  90 tablet  3  . losartan (COZAAR) 100 MG tablet TAKE 1 TABLET BY MOUTH DAILY  30 tablet  2  . nitroGLYCERIN (NITROSTAT) 0.4 MG SL tablet Place 1 tablet (0.4 mg total) under the tongue every 5 (five) minutes x 3 doses as needed for chest pain.  25 tablet  3  . pantoprazole (PROTONIX) 40 MG tablet Take 1 tablet (40 mg total) by mouth daily.  30 tablet  11  . potassium chloride SA (K-DUR,KLOR-CON) 20 MEQ tablet Take 2 tablets (40 mEq total) by mouth 2 (  two) times daily.  120 tablet  6  . warfarin (COUMADIN) 7.5 MG tablet Take 7.5 mg by mouth daily.       Scheduled:  . aspirin EC  81 mg Oral Daily  . atorvastatin  80 mg Oral QHS  . carvedilol  12.5 mg Oral BID WC  . folic acid  1 mg Oral Daily  . losartan  100 mg Oral Daily  . pantoprazole  40 mg Oral Daily  . potassium chloride SA  40 mEq Oral BID    Assessment: 52 yo male here with CP on coumadin PTA for afib and on hold for cath. Patient to begin heparin when INR < 2.0. Patient also received vitamin K 2.5mg  today. Last INR was 2.3 this am.   Goal of Therapy:  INR 2-3 Heparin level 0.3-0.7 units/ml Monitor platelets by anticoagulation protocol: Yes   Plan:  -Start heparin when INR < 2.0 -Will follow INR in am  Harland German, Pharm D 04/23/2013 7:37 PM

## 2013-04-23 NOTE — ED Provider Notes (Addendum)
History     CSN: [id number]  Arrival date & time 04/22/13  0030   First MD Initiated Contact with Patient 04/22/13 [phone]      No chief complaint on file.   (Consider location/radiation/quality/duration/timing/severity/associated sxs/prior treatment) HPI 52 year old male presents to emergency room with complaint of chest pain.  He reports onset of pain this evening associated with shortness of breath and diaphoresis.  He has also had nausea.  Patient has past history of hypertension, hyperlipidemia, PFO, coronary disease, status post cath with stent placement, DVT, and CVA.  Patient is on chronic Coumadin.  He is followed by [facility] our cardiology.  Patient reports his nitroglycerin had expired, so he did not take any prior to arrival.  He did take a full-strength aspirin.  Pain started in his left chest and radiates down his left arm.  He has had no improvement in his pain after nitroglycerin paste.  Patient noted be hypertensive upon arrival, but this has improved.  Pain feels like his prior MI.  It does radiate into his back.  Past Medical History  Diagnosis Date  . GERD (gastroesophageal reflux disease)   . HLD (hyperlipidemia)   . HTN (hypertension)   . Hematuria 8/12  . Sleep apnea     states he does not use CPAP  . PFO (patent foramen ovale)     echo 8/12: EF 55-65%, grade 2 diast dysfnx; +PFO on bubble study;  TEE 4/13: EF normal, atrial septum with suspicion for interatrial septum fenestrations without flow across and few large bubbles noted in LA.  This was not felt to require closure   . CAD (coronary artery disease)     cath 10/11 with mild LAD, 80% mid circ, PLA 70%. no s/p promus DES circumflex artery 10/13/10  . DVT (deep venous thrombosis) ~ 2012    "right" (04/22/2013)  . Hemiparesis due to old cerebral infarction 03/20/2013  . Family history of anesthesia complication     "sister; had hard time waking her up or somethins" (04/22/2013)  . Anginal pain     "that's why I'm  here" (04/22/2013)  . Exertional shortness of breath     "sometimes" (04/22/2013)  . CVA (cerebral vascular accident) 02/2010    denies residual after rehab "for about a year" (04/22/2013)  . TIA (transient ischemic attack)   . Arthritis     "knees" (04/22/2013)  . Rheumatoid arthritis     Past Surgical History  Procedure Laterality Date  . Knee arthroscopy Bilateral 2000's    2 on left and 3 on rt  . Tee without cardioversion  03/21/2012    Procedure: TRANSESOPHAGEAL ECHOCARDIOGRAM (TEE);  Surgeon: Pricilla Riffle, MD;  Location: [facility] ENDOSCOPY;  Service: Cardiovascular;  Laterality: N/A;  . Coronary angioplasty with stent placement  2012    "1" (04/22/2013)    Family History  Problem Relation Age of Onset  . Cancer Neg Hx   . Kidney disease Neg Hx   . Diabetes Neg Hx     History  Substance Use Topics  . Smoking status: Former Smoker -- 1.00 packs/day for 35 years    Types: Cigarettes    Quit date: 03/10/2012  . Smokeless tobacco: Never Used  . Alcohol Use: No      Review of Systems  All other systems reviewed and are negative.    Allergies  Iodine  Home Medications  No current outpatient prescriptions on file.  BP 146/88  Pulse 87  Temp(Src) 98.2 F (36.8 C) (Oral)  Resp 18  Wt 278 lb 12.8 oz (126.463 kg)  BMI 38.9 kg/m2  SpO2 96%  Physical Exam  Nursing note and vitals reviewed. Constitutional: He is oriented to person, place, and time. He appears well-developed and well-nourished. He appears distressed (uncomfortable appearing).  HENT:  Head: Normocephalic and atraumatic.  Nose: Nose normal.  Mouth/Throat: Oropharynx is clear and moist.  Eyes: Conjunctivae and EOM are normal. Pupils are equal, round, and reactive to light.  Neck: Normal range of motion. Neck supple. No JVD present. No tracheal deviation present. No thyromegaly present.  Cardiovascular: Normal rate, regular rhythm, normal heart sounds and intact distal pulses.  Exam reveals no gallop and no  friction rub.   No murmur heard. Pulmonary/Chest: Effort normal and breath sounds normal. No stridor. No respiratory distress. He has no wheezes. He has no rales. He exhibits no tenderness.  Abdominal: Soft. Bowel sounds are normal. He exhibits no distension and no mass. There is no tenderness. There is no rebound and no guarding.  Musculoskeletal: Normal range of motion. He exhibits no edema and no tenderness.  Lymphadenopathy:    He has no cervical adenopathy.  Neurological: He is alert and oriented to person, place, and time. He exhibits normal muscle tone. Coordination normal.  Skin: Skin is warm and dry. No rash noted. No erythema. No pallor.  Psychiatric: He has a normal mood and affect. His behavior is normal. Judgment and thought content normal.    ED Course  Procedures (including critical care time)  Labs Reviewed  COMPREHENSIVE METABOLIC PANEL - Abnormal; Notable for the following:    Potassium 3.0 (*)    Glucose, Bld 125 (*)    Albumin 3.2 (*)    GFR calc non Af Amer 84 (*)    All other components within normal limits  PROTIME-INR - Abnormal; Notable for the following:    Prothrombin Time 23.6 (*)    INR 2.21 (*)    All other components within normal limits  APTT - Abnormal; Notable for the following:    aPTT 52 (*)    All other components within normal limits  PROTIME-INR - Abnormal; Notable for the following:    Prothrombin Time 24.8 (*)    INR 2.37 (*)    All other components within normal limits  APTT - Abnormal; Notable for the following:    aPTT 57 (*)    All other components within normal limits  COMPREHENSIVE METABOLIC PANEL - Abnormal; Notable for the following:    Glucose, Bld 119 (*)    GFR calc non Af Amer 82 (*)    All other components within normal limits  HEMOGLOBIN A1C - Abnormal; Notable for the following:    Hemoglobin A1C 6.2 (*)    Mean Plasma Glucose 131 (*)    All other components within normal limits  BASIC METABOLIC PANEL - Abnormal;  Notable for the following:    Glucose, Bld 135 (*)    GFR calc non Af Amer 80 (*)    All other components within normal limits  PROTIME-INR - Abnormal; Notable for the following:    Prothrombin Time 24.3 (*)    INR 2.30 (*)    All other components within normal limits  LIPID PANEL - Abnormal; Notable for the following:    Cholesterol 240 (*)    Triglycerides 170 (*)    HDL 27 (*)    LDL Cholesterol 179 (*)    All other components within normal limits  CBC WITH DIFFERENTIAL  TROPONIN  I  TROPONIN I  TROPONIN I  CBC WITH DIFFERENTIAL  TSH  MAGNESIUM  PRO B NATRIURETIC PEPTIDE  CBC   Dg Chest 2 View  04/22/2013   *RADIOLOGY REPORT*  Clinical Data: Chest pain, shortness of breath, hypertension.  CHEST - 2 VIEW  Comparison: None.  Findings: Heart size upper normal.  Mild aortic tortuosity without overt dilatation.  Mild central vascular prominence.  No confluent airspace opacity.  No pleural effusion or pneumothorax.  No acute osseous finding.  IMPRESSION: No focal consolidation or definite radiographic evidence of acute cardiopulmonary process.   Original Report Authenticated By: Jearld Lesch, M.D.    Date: 04/23/2013  Rate: 82  Rhythm: normal sinus rhythm  QRS Axis: normal  Intervals: normal  ST/T Wave abnormalities: ST depressions inferiorly  Conduction Disutrbances:none  Narrative Interpretation:   Old EKG Reviewed: unchanged    1. Chest pain       MDM  52 year old male with chest pain.  Concern for unstable angina.  Given persistence of pain.  Will start on nitroglycerin drip.  Discussed case with on-call cardiology fellow, Dr. Tresa Endo he'll be down to see the patient and plan for admission.       Olivia Mackie, MD 04/23/13 1610  Olivia Mackie, MD 04/23/13 [phone]

## 2013-04-23 NOTE — Progress Notes (Signed)
04/23/13 2200  BiPAP/CPAP/SIPAP  BiPAP/CPAP/SIPAP Pt Type (Pt refused CPAP, he stated he could not tolerate the mask)

## 2013-04-23 NOTE — Progress Notes (Signed)
Patient complained of 7 out of 10 chest pain.  4 L O2 [place] applied, Nitro Drip increased to 10 mcg/min and EKG obtained (showed NSR).  Spoke to Dr. Excell Seltzer, no new orders received.  Will continue to monitor

## 2013-04-23 NOTE — Telephone Encounter (Signed)
ATC the pt- He picked up and asked me to hold and I did for about 3 min and then hung up Will await for the pt to call us back

## 2013-04-24 ENCOUNTER — Encounter (HOSPITAL_COMMUNITY)
Admission: EM | Disposition: A | Discharge status: Home / Self Care | Payer: Self-pay | Source: Home / Self Care | Attending: Cardiovascular Disease

## 2013-04-24 DIAGNOSIS — I2510 Atherosclerotic heart disease of native coronary artery without angina pectoris: Secondary | ICD-10-CM

## 2013-04-24 HISTORY — PX: LEFT HEART CATHETERIZATION WITH CORONARY ANGIOGRAM: SHX5451

## 2013-04-24 LAB — BASIC METABOLIC PANEL
BUN: 14 mg/dL (ref 6–23)
CO2: 29 mEq/L (ref 19–32)
Calcium: 9 mg/dL (ref 8.4–10.5)
GFR calc non Af Amer: >90 mL/min (ref >90)
Glucose, Bld: 123 mg/dL — ABNORMAL HIGH (ref 70–99)

## 2013-04-24 SURGERY — LEFT HEART CATHETERIZATION WITH CORONARY ANGIOGRAM
Anesthesia: LOCAL

## 2013-04-24 MED ORDER — METHYLPREDNISOLONE SODIUM SUCC 125 MG IJ SOLR
INTRAMUSCULAR | Status: AC
  Filled 2013-04-24: qty 2

## 2013-04-24 MED ORDER — HEPARIN SODIUM (PORCINE) 1000 UNIT/ML IJ SOLN
INTRAMUSCULAR | Status: AC
  Filled 2013-04-24: qty 1

## 2013-04-24 MED ORDER — HEPARIN (PORCINE) IN NACL 2-0.9 UNIT/ML-% IJ SOLN
INTRAMUSCULAR | Status: AC
  Filled 2013-04-24: qty 1500

## 2013-04-24 MED ORDER — SODIUM CHLORIDE 0.9 % IV SOLN: INTRAVENOUS | Status: AC

## 2013-04-24 MED ORDER — FENTANYL CITRATE 0.05 MG/ML IJ SOLN
INTRAMUSCULAR | Status: AC
  Filled 2013-04-24: qty 2

## 2013-04-24 MED ORDER — WARFARIN SODIUM 10 MG PO TABS
10.0000 mg | ORAL_TABLET | Freq: Once | ORAL | Status: AC
  Administered 2013-04-24: 10 mg via ORAL
  Filled 2013-04-24: qty 1

## 2013-04-24 MED ORDER — DIPHENHYDRAMINE HCL 50 MG/ML IJ SOLN
INTRAMUSCULAR | Status: AC
  Filled 2013-04-24: qty 1

## 2013-04-24 MED ORDER — MIDAZOLAM HCL 2 MG/2ML IJ SOLN
INTRAMUSCULAR | Status: AC
  Filled 2013-04-24: qty 2

## 2013-04-24 MED ORDER — LIDOCAINE HCL (PF) 1 % IJ SOLN
INTRAMUSCULAR | Status: AC
  Filled 2013-04-24: qty 30

## 2013-04-24 MED ORDER — WARFARIN - PHARMACIST DOSING INPATIENT: Freq: Every day | Status: DC

## 2013-04-24 MED ORDER — ISOSORBIDE MONONITRATE ER 30 MG PO TB24
30.0000 mg | ORAL_TABLET | Freq: Every day | ORAL | Status: DC
  Administered 2013-04-24 – 2013-04-25 (×2): 30 mg via ORAL
  Filled 2013-04-24 (×2): qty 1

## 2013-04-24 MED ORDER — VERAPAMIL HCL 2.5 MG/ML IV SOLN
INTRAVENOUS | Status: AC
  Filled 2013-04-24: qty 2

## 2013-04-24 MED ORDER — METHYLPREDNISOLONE SODIUM SUCC 125 MG IJ SOLR
125.0000 mg | INTRAMUSCULAR | Status: AC
Start: 2013-04-24 — End: 2013-04-24
  Administered 2013-04-24: 125 mg via INTRAVENOUS

## 2013-04-24 MED ORDER — FAMOTIDINE IN NACL 20-0.9 MG/50ML-% IV SOLN
INTRAVENOUS | Status: AC
  Filled 2013-04-24: qty 50

## 2013-04-24 MED ORDER — FAMOTIDINE IN NACL 20-0.9 MG/50ML-% IV SOLN
20.0000 mg | INTRAVENOUS | Status: AC
  Administered 2013-04-24: 20 mg via INTRAVENOUS

## 2013-04-24 MED ORDER — DIPHENHYDRAMINE HCL 50 MG/ML IJ SOLN
25.0000 mg | INTRAMUSCULAR | Status: AC
  Administered 2013-04-24: 25 mg via INTRAVENOUS

## 2013-04-24 NOTE — Telephone Encounter (Signed)
Returning call can be reached at [phone].Tony Davis

## 2013-04-24 NOTE — Progress Notes (Signed)
Pt has refused CPAP machine for the night. Pt says our hospital masks feels like its choking him and he will be going home in the morning anyway so he will be OK without the machine for tonight. Rt advised Pt to call if he needs anything or changes his mind. RT will assist as needed.

## 2013-04-24 NOTE — Progress Notes (Signed)
Spoke with Alinda Money, [place]. He stated it was ok that CPAP be started

## 2013-04-24 NOTE — Progress Notes (Signed)
ANTICOAGULATION CONSULT NOTE - Initial Consult  Pharmacy Consult for coumadin Indication: atrial fibrillation  Allergies  Allergen Reactions  . Iodine     REACTION: anaphylaxis    Patient Measurements: Height: 5\' 11"  (180.3 cm) Weight: 272 lb 3.2 oz (123.469 kg) IBW/kg (Calculated) : 75.3 Heparin Dosing Weight: 103.6kg  Vital Signs: Temp: 98.5 F (36.9 C) (05/16 0500) Temp src: Oral (05/16 0500) BP: 160/93 mmHg (05/16 0500) Pulse Rate: 81 (05/16 0733)  Labs:  Recent Labs  04/22/13 0200 04/22/13 1005 04/22/13 1748 04/23/13 0525 04/24/13 0540  HGB 13.3 13.1  --  13.2  --   HCT 40.3 39.7  --  40.4  --   PLT 312 309  --  307  --   APTT 52* 57*  --   --   --   LABPROT 23.6* 24.8*  --  24.3* 18.5*  INR 2.21* 2.37*  --  2.30* 1.59*  CREATININE 1.03 1.05  --  1.07 0.98  TROPONINI <0.30 <0.30 <0.30  --   --     Estimated Creatinine Clearance: 122 ml/min (by C-G formula based on Cr of 0.98).   Medical History: Past Medical History  Diagnosis Date  . GERD (gastroesophageal reflux disease)   . HLD (hyperlipidemia)   . HTN (hypertension)   . Hematuria 8/12  . Sleep apnea     states he does not use CPAP  . PFO (patent foramen ovale)     echo 8/12: EF 55-65%, grade 2 diast dysfnx; +PFO on bubble study;  TEE 4/13: EF normal, atrial septum with suspicion for interatrial septum fenestrations without flow across and few large bubbles noted in LA.  This was not felt to require closure   . CAD (coronary artery disease)     cath 10/11 with mild LAD, 80% mid circ, PLA 70%. no s/p promus DES circumflex artery 10/13/10  . DVT (deep venous thrombosis) ~ 2012    "right" (04/22/2013)  . Hemiparesis due to old cerebral infarction 03/20/2013  . Family history of anesthesia complication     "sister; had hard time waking her up or somethins" (04/22/2013)  . Anginal pain     "that's why I'm here" (04/22/2013)  . Exertional shortness of breath     "sometimes" (04/22/2013)  . CVA  (cerebral vascular accident) 02/2010    denies residual after rehab "for about a year" (04/22/2013)  . TIA (transient ischemic attack)   . Arthritis     "knees" (04/22/2013)  . Rheumatoid arthritis     Medications:  Prescriptions prior to admission  Medication Sig Dispense Refill  . amLODipine (NORVASC) 10 MG tablet TAKE 1 TABLET BY MOUTH DAILY  30 tablet  3  . atorvastatin (LIPITOR) 80 MG tablet Take 1 tablet (80 mg total) by mouth at bedtime.  90 tablet  2  . carvedilol (COREG) 12.5 MG tablet Take 1 tablet (12.5 mg total) by mouth 2 (two) times daily.  60 tablet  6  . folic acid (FOLVITE) 1 MG tablet Take 1 tablet (1 mg total) by mouth daily.  90 tablet  3  . losartan (COZAAR) 100 MG tablet TAKE 1 TABLET BY MOUTH DAILY  30 tablet  2  . nitroGLYCERIN (NITROSTAT) 0.4 MG SL tablet Place 1 tablet (0.4 mg total) under the tongue every 5 (five) minutes x 3 doses as needed for chest pain.  25 tablet  3  . pantoprazole (PROTONIX) 40 MG tablet Take 1 tablet (40 mg total) by mouth daily.  30 tablet  11  . potassium chloride SA (K-DUR,KLOR-CON) 20 MEQ tablet Take 2 tablets (40 mEq total) by mouth 2 (two) times daily.  120 tablet  6  . warfarin (COUMADIN) 7.5 MG tablet Take 7.5 mg by mouth daily.       Scheduled:  . aspirin EC  81 mg Oral Daily  . atorvastatin  80 mg Oral QHS  . carvedilol  12.5 mg Oral BID WC  . folic acid  1 mg Oral Daily  . isosorbide mononitrate  30 mg Oral Daily  . losartan  100 mg Oral Daily  . pantoprazole  40 mg Oral Daily  . potassium chloride SA  40 mEq Oral BID    Assessment: 52 yo male here with CP on coumadin PTA for afib. S/p cath with single vessel dz. Plan for medrx. Resuming coumadin today. She did received a dose of vit k so the INR may be a little resistant.   Goal of Therapy:  INR 2-3 Monitor platelets by anticoagulation protocol: Yes   Plan:   Coumadin 10mg  PO x1 Daily INR

## 2013-04-24 NOTE — Progress Notes (Signed)
Spoke with Albin Felling RT and informed her of cpap orders

## 2013-04-24 NOTE — H&P (View-Only) (Signed)
Patient: Tony Davis / Admit Date: 04/22/2013 / Date of Encounter: 04/23/2013, 12:22 PM   Subjective  Currently pain free, but when he got up to wash up, he felt SOB which has resolved.  Coming in, his chest pain began at 11pm the evening of admission while watching TV, substernally with radiation to his back and some SOB. It was much worse when he got up to walk around. CP lasted about 3-4 hours in total, constant, "like a truck was driving over me." It was eventually relieved with a dose of morphine given at the same time as NTG gtt initiation. He did not try any SL NTG due to h/o headache. He remains on 5mcg/min. Symptoms are similar to prior angina. He also reports recent development of intermittent LEE, which prevents him from putting on his shoes/socks or walking. This is not present today.   Objective   Telemetry: NSR Physical Exam: Filed Vitals:   04/23/13 0800  BP: 146/88  Pulse: 87  Temp: 98.2  Resp: 18   General: Well developed, well nourished overweight AAM in no acute distress. Head: Normocephalic, atraumatic, sclera non-icteric, no xanthomas, nares are without discharge. Neck: Negative for carotid bruits. JVD not elevated. Lungs: Clear bilaterally to auscultation without wheezes, rales, or rhonchi. Breathing is unlabored. Heart: RRR S1 S2 without murmurs, rubs, or gallops.  Abdomen: Soft, non-tender, non-distended with normoactive bowel sounds. No hepatomegaly. No rebound/guarding. No obvious abdominal masses. Msk:  Strength and tone appear normal for age. Extremities: No clubbing or cyanosis. No edema.  Distal pedal pulses are 2+ and equal bilaterally. Neuro: Alert and oriented X 3. Moves all extremities spontaneously. Psych:  Responds to questions appropriately with a normal affect.    Intake/Output Summary (Last 24 hours) at 04/23/13 1222 Last data filed at 04/23/13 0645  Gross per 24 hour  Intake      0 ml  Output    800 ml  Net   -800 ml    Inpatient  Medications:  . aspirin EC  81 mg Oral Daily  . atorvastatin  80 mg Oral QHS  . carvedilol  12.5 mg Oral BID WC  . folic acid  1 mg Oral Daily  . losartan  100 mg Oral Daily  . pantoprazole  40 mg Oral Daily  . potassium chloride SA  40 mEq Oral BID    Labs:  Recent Labs  04/22/13 1005 04/23/13 0525  NA 142 140  K 3.6 4.0  CL 103 104  CO2 31 29  GLUCOSE 119* 135*  BUN 21 18  CREATININE 1.05 1.07  CALCIUM 9.1 9.4  MG 1.9  --     Recent Labs  04/22/13 0200 04/22/13 1005  AST 16 16  ALT 17 16  ALKPHOS 96 97  BILITOT 0.3 0.4  PROT 6.8 6.8  ALBUMIN 3.2* 3.5    Recent Labs  04/22/13 0200 04/22/13 1005 04/23/13 0525  WBC 9.4 6.9 8.1  NEUTROABS 6.1 3.4  --   HGB 13.3 13.1 13.2  HCT 40.3 39.7 40.4  MCV 87.2 87.6 88.8  PLT 312 309 307    Recent Labs  04/22/13 0200 04/22/13 1005 04/22/13 1748  TROPONINI <0.30 <0.30 <0.30   No components found with this basename: POCBNP,   Recent Labs  04/22/13 1005  HGBA1C 6.2*     Radiology/Studies:  Dg Chest 2 View  04/22/2013   *RADIOLOGY REPORT*  Clinical Data: Chest pain, shortness of breath, hypertension.  CHEST - 2 VIEW  Comparison:   None.  Findings: Heart size upper normal.  Mild aortic tortuosity without overt dilatation.  Mild central vascular prominence.  No confluent airspace opacity.  No pleural effusion or pneumothorax.  No acute osseous finding.  IMPRESSION: No focal consolidation or definite radiographic evidence of acute cardiopulmonary process.   Original Report Authenticated By: Andrew  DelGaizo, M.D.     Assessment and Plan  1. Chest pain concerning for [place] with history of known CAD 2. Prior h/o DVT 2013, on Coumadin with therapeutic INR (pt does not know reason for DVT) 3. PFO 4. Prior CVA 5. Dyslipidemia 6. OSA, resistant to CPAP  Enzymes negative despite several hours of chest pain and pBNP WNL, but symptoms remind patient of prior angina. LFTs, CBC normal. INR remains elevated - last dose of  Coumadin was evening of 5/13, suspect we will see downtrend of INR by tomorrow with complete holding of Coumadin. Decision regarding cath following MD assessment. Unclear what to make of intermittent LEE - ? obtain LE dopplers to make sure he doesn't have recurrent DVT on coumadin vs 2D echo.   Signed, Dayna Dunn PA-C  Patient seen, examined. Available data reviewed. Agree with findings, assessment, and plan as outlined by Dayna Dunn, PA-C. Exam reveals obese AAM in NAD, pleasant. Lungs CTA and heart RRR without murmur, no edema. EKG reviewed and shows no acute changes. Sx's concerning for unstable angina and I agree cardiac cath is appropriate. Will give Vitamin K 2.5 mg PO x 1 and follow INR in the am. Risks, indication, and alternatives to cath and possible PCI reviewed with the patient who understands and agrees to proceed.  Ballard Budney, M.D. 04/23/2013 3:23 PM   

## 2013-04-24 NOTE — Telephone Encounter (Signed)
Per CY-lets call the hospital and have his nurse help getting CPAP/BiPAP set up while he is there. Crissy his nurse states that RT told her they use BiPAP and need settings ans liter flow for O2. Spoke with CY about this and was told to give range of 10-25 with 2L/M O2 bleed through. Crissy took information and will send order to RT.   I then spoke with patient and informed him of the above. He is aware that RT should come by and set him up while in hospital. Nothing more needed at this time.

## 2013-04-24 NOTE — Telephone Encounter (Signed)
I spoke with the pt and he is currently in [facility]. He states they are trying to get him to use CPAP there but he cannot tolerate the mask that they have. He states that CY orderd a cpap titration study at last OV but he has never received the results of this. He states the mask that he used for that study worked much better for him and he wants this mask. Pt wants to know what the titration study results are and how can he get the mask that they used for that test? Please advise. Carron Curie, CMA

## 2013-04-24 NOTE — Telephone Encounter (Signed)
[email or web address].Royal Beirne, CMA  

## 2013-04-24 NOTE — Interval H&P Note (Signed)
History and Physical Interval Note:  04/24/2013 7:31 AM  Tony Davis  has presented today for cardiac cath  with the diagnosis of CAD/Chest pain  The various methods of treatment have been discussed with the patient and family. After consideration of risks, benefits and other options for treatment, the patient has consented to  Procedure(s): LEFT HEART CATHETERIZATION WITH CORONARY ANGIOGRAM (N/A) as a surgical intervention .  The patient's history has been reviewed, patient examined, no change in status, stable for surgery.  I have reviewed the patient's chart and labs.  Questions were answered to the patient's satisfaction.     MCALHANY,CHRISTOPHER

## 2013-04-24 NOTE — Progress Notes (Signed)
Dr. Sinclair Ship office called back with order of range of 10-25 and may bleed in 2l [place] on CPAP. He wanted me to clarify with cardiologist for orders. Call made to MD

## 2013-04-24 NOTE — CV Procedure (Signed)
Cardiac Catheterization Operative Report  Tony Davis [id number] 5/16/20147:35 AM [name], MD  Procedure Performed:  1. Left Heart Catheterization 2. Selective Coronary Angiography 3. Left ventricular angiogram  Operator: Verne Carrow, MD  Arterial access site:  Right radial artery.   Indication: 52 yo AAM with history of CAD s/p DES distal Circumflex November 2011 with moderate disease PLA at that time, HTN, hyperlipidemia, impaired fasting glucose, DVT, TIA admitted with chest pain concerning for unstable angina. He has been maintained on IV NTG with recurrent episodes of CP during the admission awaiting INR to fall below 1.7. INR is 1.5 this am. Cardiac markers are negative.                                       Procedure Details: The risks, benefits, complications, treatment options, and expected outcomes were discussed with the patient. The patient and/or family concurred with the proposed plan, giving informed consent. The patient was brought to the cath lab after IV hydration was begun and oral premedication was given. The patient was further sedated with Versed. The right wrist was assessed with an Allens test which was positive. The right wrist was prepped and draped in a sterile fashion. 1% lidocaine was used for local anesthesia. Using the modified Seldinger access technique, a 5 French sheath was placed in the right radial artery. 3 mg Verapamil was given through the sheath. 5500 units IV heparin was given. Standard diagnostic catheters were used to perform selective coronary angiography. A pigtail catheter was used to perform a left ventricular angiogram.   The sheath was removed from the right radial artery and a Terumo hemostasis band was applied at the arteriotomy site on the right wrist. There were no immediate complications. The patient was taken to the recovery area in stable condition.   Hemodynamic Findings: Central aortic pressure: 147/93 Left  ventricular pressure: 156/11/22  Angiographic Findings:  Left main: No obstructive disease noted.   Left Anterior Descending Artery: Large caliber vessel that does not reach the apex. There are minor luminal irregularities in the mid vessel. The diagonal branch is moderate in caliber and has no obstructive disease.    Circumflex Artery: Large caliber vessel with moderate caliber intermediate branch followed by two obtuse marginal branches. The intermediate branch has minor plaque disease. The two obtuse marginal branches have minor plaque disease. The stent in the distal AV groove Circumflex is patent with no restenosis.   Right Coronary Artery: Large caliber, dominant vessel that wraps around the apex. The proximal vessel has minor luminal irregularities. The mid vessel has diffuse 30% stenosis. The PDA is very large and reaches the apex. The PDA has minor plaque disease. The Posterolateral branch is a moderate caliber bifurcating vessel. The ostium of the posterolateral branch has 40% stenosis. The distal portion of this vessel become small in caliber then bifurcates and there is a 70% stenosis at the bifurcation involving both the superior and inferior sub-branches. This is unchanged in appearance since his last cath in 2011. This vessel is too small for PCI.   Left Ventricular Angiogram: LVEF=55-60%.   Impression: 1. Stable single vessel CAD 2. Patent stent distal Circumflex 3. Normal LV systolic function.   Recommendations: Will continue medical management. Will add Imdur 30 mg po QHS for chest pain that may be from small vessel disease. Resume coumadin tonight. He does not need to be  therapeutic before discharge. He is followed in our coumadin clinic. Anticipate discharge in the am. Follow up in coumadin clinic next week on Tuesday or Wednesday.        Complications:  None. The patient tolerated the procedure well.

## 2013-04-24 NOTE — Progress Notes (Signed)
I received a call from Dr. Sinclair Ship office in regards to pt needing CPAP to be started in the hospital from recent outpt sleep study. Orders in the process of being clarified with what we have available. Katie, RN from his office is to call me back with specifics

## 2013-04-25 ENCOUNTER — Encounter (HOSPITAL_COMMUNITY): Payer: Self-pay | Admitting: Physician Assistant

## 2013-04-25 DIAGNOSIS — R079 Chest pain, unspecified: Secondary | ICD-10-CM

## 2013-04-25 DIAGNOSIS — I2510 Atherosclerotic heart disease of native coronary artery without angina pectoris: Principal | ICD-10-CM

## 2013-04-25 MED ORDER — WARFARIN SODIUM 10 MG PO TABS: 10.0000 mg | ORAL_TABLET | Freq: Once | ORAL | Status: DC

## 2013-04-25 MED ORDER — NITROGLYCERIN 0.4 MG SL SUBL: 0.4000 mg | SUBLINGUAL_TABLET | SUBLINGUAL | Status: DC | PRN

## 2013-04-25 MED ORDER — WARFARIN SODIUM 10 MG PO TABS
10.0000 mg | ORAL_TABLET | Freq: Once | ORAL | Status: DC
  Filled 2013-04-25: qty 1

## 2013-04-25 MED ORDER — ISOSORBIDE MONONITRATE ER 30 MG PO TB24: 30.0000 mg | ORAL_TABLET | Freq: Every day | ORAL | Status: DC

## 2013-04-25 MED ORDER — WARFARIN SODIUM 10 MG PO TABS
10.0000 mg | ORAL_TABLET | Freq: Once | ORAL | Status: AC
  Administered 2013-04-25: 10 mg via ORAL
  Filled 2013-04-25: qty 1

## 2013-04-25 MED ORDER — ASPIRIN 81 MG PO TBEC: 81.0000 mg | DELAYED_RELEASE_TABLET | Freq: Every day | ORAL | Status: DC

## 2013-04-25 NOTE — Discharge Summary (Signed)
Discharge Summary   Patient ID: Tony Davis MRN: [id number], DOB/AGE: [birth year] 50 y.o. Admit date: 04/22/2013 D/C date:     04/25/2013  Primary Cardiologist: Clifton James  Primary Discharge Diagnoses:  1. Chest pain/angina - cardiac cath showing stable single vessel diesase, patent stent distal LCx, EF 55-60%, Imdur added for possible angina from small vessel disease 2. Prior h/o DVT 2013, on Coumadin with therapeutic INR 3. PFO  4. Prior CVA  5. Dyslipidemia  6. OSA, resistant to CPAP 7. HTN  Secondary Discharge Diagnoses:  1. GERD 2. Hematuria 07/2011 3. TIA 4. Arthritis  Hospital Course:Tony Davis is a 52 y/o M with history of dyslipidemia, PFO, prior CVA, DVT and CAD with prior stent 2 years ago who presented to [facility] with chest pain 04/22/13.  comes in with CP. His chest pain began at 11pm the evening of admission while watching TV, substernally with radiation to his back and some SOB. It was much worse when he got up to walk around. The CP lasted about 3-4 hours in total, constant, "like a truck was driving over me." It was eventually relieved with a dose of morphine given at the same time as NTG gtt initiation. Symptoms were similar to prior angina. Cardiac cath was recommended, thus Coumadin was held. His INR was slow to come down so he was given vitamin K. Yesterday cath demonstrated: Impression:  1. Stable single vessel CAD  2. Patent stent distal Circumflex  3. Normal LV systolic function.  Dr. Clifton James recommended medical therapy and also recommended to add Imdur 30mg  po QHS for CP that may be from small vessel disease (?PLA branch). Coumadin was resumed yesterday. He is doing well this morning. He will get today's dose of Coumadin before he leaves. Dr. Elease Hashimoto has seen and examined the patient today and feels he is stable for discharge. He feels the pt can return to work 5/19 with the usual radial cath restrictions. He recommended to continue ASA at low dose and observe for  bleeding. I have left a message on our office's scheduling voicemail requesting a follow-up appointment as well as Coumadin clinic appt for Tues/Wed of this upcoming week as requested by Dr. Clifton James, and our office will call the patient with these appointments.   Discharge Vitals: Blood pressure 147/84, pulse 100, temperature 98.1 F (36.7 C), temperature source Oral, resp. rate 20, height 5\' 11"  (1.803 m), weight 272 lb 3.2 oz (123.469 kg), SpO2 96.00%.  Labs: Lab Results  Component Value Date   WBC 8.1 04/23/2013   HGB 13.2 04/23/2013   HCT 40.4 04/23/2013   MCV 88.8 04/23/2013   PLT 307 04/23/2013    Recent Labs Lab 04/22/13 1005  04/24/13 0540  NA 142  < > 138  K 3.6  < > 3.6  CL 103  < > 101  CO2 31  < > 29  BUN 21  < > 14  CREATININE 1.05  < > 0.98  CALCIUM 9.1  < > 9.0  PROT 6.8  --   --   BILITOT 0.4  --   --   ALKPHOS 97  --   --   ALT 16  --   --   AST 16  --   --   GLUCOSE 119*  < > 123*  < > = values in this interval not displayed.  Recent Labs  04/22/13 1748  TROPONINI <0.30   Lab Results  Component Value Date   CHOL 240* 04/23/2013  HDL 27* 04/23/2013   LDLCALC 179* 04/23/2013   TRIG 170* 04/23/2013    Diagnostic Studies/Procedures   Cardiac catheterization this admission, please see full report and above for summary.  Dg Chest 2 View 04/22/2013   *RADIOLOGY REPORT*  Clinical Data: Chest pain, shortness of breath, hypertension.  CHEST - 2 VIEW  Comparison: None.  Findings: Heart size upper normal.  Mild aortic tortuosity without overt dilatation.  Mild central vascular prominence.  No confluent airspace opacity.  No pleural effusion or pneumothorax.  No acute osseous finding.  IMPRESSION: No focal consolidation or definite radiographic evidence of acute cardiopulmonary process.   Original Report Authenticated By: Jearld Lesch, M.D.    Discharge Medications     Medication List    TAKE these medications       amLODipine 10 MG tablet  Commonly known  as:  NORVASC  TAKE 1 TABLET BY MOUTH DAILY     aspirin 81 MG EC tablet  Take 1 tablet (81 mg total) by mouth daily.     atorvastatin 80 MG tablet  Commonly known as:  LIPITOR  Take 1 tablet (80 mg total) by mouth at bedtime.     carvedilol 12.5 MG tablet  Commonly known as:  COREG  Take 1 tablet (12.5 mg total) by mouth 2 (two) times daily.     folic acid 1 MG tablet  Commonly known as:  FOLVITE  Take 1 tablet (1 mg total) by mouth daily.     isosorbide mononitrate 30 MG 24 hr tablet  Commonly known as:  IMDUR  Take 1 tablet (30 mg total) by mouth at bedtime.     losartan 100 MG tablet  Commonly known as:  COZAAR  TAKE 1 TABLET BY MOUTH DAILY     nitroGLYCERIN 0.4 MG SL tablet  Commonly known as:  NITROSTAT  Place 1 tablet (0.4 mg total) under the tongue every 5 (five) minutes as needed for chest pain (up to 3 doses).     pantoprazole 40 MG tablet  Commonly known as:  PROTONIX  Take 1 tablet (40 mg total) by mouth daily.     potassium chloride SA 20 MEQ tablet  Commonly known as:  K-DUR,KLOR-CON  Take 2 tablets (40 mEq total) by mouth 2 (two) times daily.     warfarin 7.5 MG tablet  Commonly known as:  COUMADIN  Take 7.5 mg by mouth daily.        Disposition   The patient will be discharged in stable condition to home. Discharge Orders   Future Appointments Provider Department Dept Phone   05/07/2013 3:45 PM Waymon Budge, MD [facility] [phone]   05/28/2013 8:30 AM [facility] [phone]   Future Orders Complete By Expires     Diet - low sodium heart healthy  As directed     Discharge instructions  As directed     Comments:      If you see any signs of unusual bleeding including blood in your stool, black tarry stools, nosebleeds, blood in your urine, or any other concerning symptoms, call your doctor.    Increase activity slowly  As directed     Comments:      No driving for 2 days. No  lifting over 5 lbs for 1 week. No sexual activity for 1 week. You may return to work Monday 04/27/13 with the above restrictions. Keep procedure site clean & dry. If you notice increased pain, swelling,  bleeding or pus, call/return!  You may shower, but no soaking baths/hot tubs/pools for 1 week.      Follow-up Information   Follow up with [facility]. (Our office will call you for a follow-up appointment. They will also call you for Coumadin Clinic appointment for Tues or Wed of this upcoming week. Please call the office if you have not heard from [place] within 3 days.)    Contact information:   [address] [place] [address] [phone]        Duration of Discharge Encounter: Greater than 30 minutes including physician and PA time.  Signed, Ronie Spies PA-C 04/25/2013, 10:26 AM  Attending Note:   The patient was seen and examined.  Agree with assessment and plan as noted above.  Changes made to the above note as needed.  See note from 04/25/13. Pt ok for DC  Alvia Grove., MD, [facility] 04/26/2013, 9:00 AM

## 2013-04-25 NOTE — Progress Notes (Signed)
Spoke to [employer] PA she stated to give patient's dose of 10 mg coumadin prior to discharge and to also instruct patient to take his dose of Norvasc when he arrives home.

## 2013-04-25 NOTE — Progress Notes (Signed)
ANTICOAGULATION CONSULT NOTE - Initial Consult  Pharmacy Consult for coumadin Indication: atrial fibrillation  Allergies  Allergen Reactions  . Iodine     REACTION: anaphylaxis    Patient Measurements: Height: 5\' 11"  (180.3 cm) Weight: 272 lb 3.2 oz (123.469 kg) IBW/kg (Calculated) : 75.3 Heparin Dosing Weight: 103.6kg  Vital Signs: Temp: 98.1 F (36.7 C) (05/17 0500) Temp src: Oral (05/17 0000) BP: 147/84 mmHg (05/17 0500) Pulse Rate: 100 (05/17 0500)  Labs:  Recent Labs  04/22/13 1005 04/22/13 1748 04/23/13 0525 04/24/13 0540  HGB 13.1  --  13.2  --   HCT 39.7  --  40.4  --   PLT 309  --  307  --   APTT 57*  --   --   --   LABPROT 24.8*  --  24.3* 18.5*  INR 2.37*  --  2.30* 1.59*  CREATININE 1.05  --  1.07 0.98  TROPONINI <0.30 <0.30  --   --     Estimated Creatinine Clearance: 122 ml/min (by C-G formula based on Cr of 0.98).   Medical History: Past Medical History  Diagnosis Date  . GERD (gastroesophageal reflux disease)   . HLD (hyperlipidemia)   . HTN (hypertension)   . Hematuria 8/12  . Sleep apnea     states he does not use CPAP  . PFO (patent foramen ovale)     echo 8/12: EF 55-65%, grade 2 diast dysfnx; +PFO on bubble study;  TEE 4/13: EF normal, atrial septum with suspicion for interatrial septum fenestrations without flow across and few large bubbles noted in LA.  This was not felt to require closure   . CAD (coronary artery disease)     cath 10/11 with mild LAD, 80% mid circ, PLA 70%. no s/p promus DES circumflex artery 10/13/10  . DVT (deep venous thrombosis) ~ 2012    "right" (04/22/2013)  . Hemiparesis due to old cerebral infarction 03/20/2013  . Family history of anesthesia complication     "sister; had hard time waking her up or somethins" (04/22/2013)  . Anginal pain     "that's why I'm here" (04/22/2013)  . Exertional shortness of breath     "sometimes" (04/22/2013)  . CVA (cerebral vascular accident) 02/2010    denies residual after  rehab "for about a year" (04/22/2013)  . TIA (transient ischemic attack)   . Arthritis     "knees" (04/22/2013)  . Rheumatoid arthritis     Medications:  Prescriptions prior to admission  Medication Sig Dispense Refill  . amLODipine (NORVASC) 10 MG tablet TAKE 1 TABLET BY MOUTH DAILY  30 tablet  3  . atorvastatin (LIPITOR) 80 MG tablet Take 1 tablet (80 mg total) by mouth at bedtime.  90 tablet  2  . carvedilol (COREG) 12.5 MG tablet Take 1 tablet (12.5 mg total) by mouth 2 (two) times daily.  60 tablet  6  . folic acid (FOLVITE) 1 MG tablet Take 1 tablet (1 mg total) by mouth daily.  90 tablet  3  . losartan (COZAAR) 100 MG tablet TAKE 1 TABLET BY MOUTH DAILY  30 tablet  2  . nitroGLYCERIN (NITROSTAT) 0.4 MG SL tablet Place 1 tablet (0.4 mg total) under the tongue every 5 (five) minutes x 3 doses as needed for chest pain.  25 tablet  3  . pantoprazole (PROTONIX) 40 MG tablet Take 1 tablet (40 mg total) by mouth daily.  30 tablet  11  . potassium chloride SA (K-DUR,KLOR-CON) 20 MEQ  tablet Take 2 tablets (40 mEq total) by mouth 2 (two) times daily.  120 tablet  6  . warfarin (COUMADIN) 7.5 MG tablet Take 7.5 mg by mouth daily.       Scheduled:  . aspirin EC  81 mg Oral Daily  . atorvastatin  80 mg Oral QHS  . carvedilol  12.5 mg Oral BID WC  . folic acid  1 mg Oral Daily  . isosorbide mononitrate  30 mg Oral Daily  . losartan  100 mg Oral Daily  . pantoprazole  40 mg Oral Daily  . potassium chloride SA  40 mEq Oral BID  . Warfarin - Pharmacist Dosing Inpatient   Does not apply q1800    Assessment: 52 yo male here with CP on coumadin PTA for afib. S/p cath with single vessel dz. Plan for medrx.  She did received a dose of vit k so the INR may be a little resistant. INR subtherapeutic today.   Goal of Therapy:  INR 2-3 Monitor platelets by anticoagulation protocol: Yes   Plan:   Coumadin 10mg  PO x1 Daily INR

## 2013-04-25 NOTE — Progress Notes (Signed)
Patient: Tony Davis / Admit Date: 04/22/2013 / Date of Encounter: 04/25/2013, 9:30 AM   Subjective  Currently pain free, but when he got up to wash up, he felt SOB which has resolved.  Coming in, his chest pain began at 11pm the evening of admission while watching TV, substernally with radiation to his back and some SOB. It was much worse when he got up to walk around. CP lasted about 3-4 hours in total, constant, "like a truck was driving over me." It was eventually relieved with a dose of morphine given at the same time as NTG gtt initiation. He did not try any SL NTG due to h/o headache. He remains on 75mcg/min. Symptoms are similar to prior angina. He also reports recent development of intermittent LEE, which prevents him from putting on his shoes/socks or walking.   Cath 5/16 reveals mild irreg in LAD and LCx.  The distal RCA has a 70% stenosis in the distal PLB.  He is feeling better.   Coumadin was restarted last night.    Objective   Telemetry: NSR Physical Exam: Filed Vitals:   04/23/13 0800  BP: 146/88  Pulse: 87  Temp: 98.2  Resp: 18   General: Well developed, well nourished overweight AAM in no acute distress. Head: Normocephalic, atraumatic, sclera non-icteric, no xanthomas, nares are without discharge. Neck: Negative for carotid bruits. JVD not elevated. Lungs: Clear bilaterally to auscultation without wheezes, rales, or rhonchi. Breathing is unlabored. Heart: RRR S1 S2 without murmurs, rubs, or gallops.  Abdomen: Soft, non-tender, non-distended with normoactive bowel sounds. No hepatomegaly. No rebound/guarding. No obvious abdominal masses. Msk:  Strength and tone appear normal for age. Extremities: No clubbing or cyanosis. No edema.  Distal pedal pulses are 2+ and equal bilaterally. Neuro: Alert and oriented X 3. Moves all extremities spontaneously. Psych:  Responds to questions appropriately with a normal affect.    Intake/Output Summary (Last 24 hours) at  04/25/13 0930 Last data filed at 04/25/13 0500  Gross per 24 hour  Intake    720 ml  Output   1325 ml  Net   -605 ml    Inpatient Medications:  . aspirin EC  81 mg Oral Daily  . atorvastatin  80 mg Oral QHS  . carvedilol  12.5 mg Oral BID WC  . folic acid  1 mg Oral Daily  . isosorbide mononitrate  30 mg Oral Daily  . losartan  100 mg Oral Daily  . pantoprazole  40 mg Oral Daily  . potassium chloride SA  40 mEq Oral BID  . warfarin  10 mg Oral ONCE-1800  . Warfarin - Pharmacist Dosing Inpatient   Does not apply q1800    Labs:  Recent Labs  04/22/13 1005 04/23/13 0525 04/24/13 0540  NA 142 140 138  K 3.6 4.0 3.6  CL 103 104 101  CO2 31 29 29   GLUCOSE 119* 135* 123*  BUN 21 18 14   CREATININE 1.05 1.07 0.98  CALCIUM 9.1 9.4 9.0  MG 1.9  --   --     Recent Labs  04/22/13 1005  AST 16  ALT 16  ALKPHOS 97  BILITOT 0.4  PROT 6.8  ALBUMIN 3.5    Recent Labs  04/22/13 1005 04/23/13 0525  WBC 6.9 8.1  NEUTROABS 3.4  --   HGB 13.1 13.2  HCT 39.7 40.4  MCV 87.6 88.8  PLT 309 307    Recent Labs  04/22/13 1005 04/22/13 1748  TROPONINI <0.30 <  0.30   No components found with this basename: POCBNP,   Recent Labs  04/22/13 1005  HGBA1C 6.2*     Radiology/Studies:  Dg Chest 2 View  04/22/2013   *RADIOLOGY REPORT*  Clinical Data: Chest pain, shortness of breath, hypertension.  CHEST - 2 VIEW  Comparison: None.  Findings: Heart size upper normal.  Mild aortic tortuosity without overt dilatation.  Mild central vascular prominence.  No confluent airspace opacity.  No pleural effusion or pneumothorax.  No acute osseous finding.  IMPRESSION: No focal consolidation or definite radiographic evidence of acute cardiopulmonary process.   Original Report Authenticated By: Jearld Lesch, M.D.     Assessment and Plan  1. Chest pain concerning for [place] with history of known CAD-  2. Prior h/o DVT 2013, on Coumadin with therapeutic INR (pt does not know reason for  DVT) 3. PFO 4. Prior CVA 5. Dyslipidemia 6. OSA, resistant to CPAP  Cath revealed single vessel distal PLSA stenosis that we will treat medically - Imdur was added.  Coumadin was restarted last night.  He will go to the coumadin clinic Tuesday or Wednesday.   DC to home today.    Vesta Mixer, Montez Hageman., MD, [facility] 04/25/2013, 9:33 AM Office - [phone] Pager [phone]

## 2013-04-29 ENCOUNTER — Ambulatory Visit (INDEPENDENT_AMBULATORY_CARE_PROVIDER_SITE_OTHER): Payer: BC Managed Care – PPO | Admitting: *Deleted

## 2013-04-29 DIAGNOSIS — Z7901 Long term (current) use of anticoagulants: Secondary | ICD-10-CM

## 2013-04-29 DIAGNOSIS — I6350 Cerebral infarction due to unspecified occlusion or stenosis of unspecified cerebral artery: Secondary | ICD-10-CM

## 2013-04-29 DIAGNOSIS — I82409 Acute embolism and thrombosis of unspecified deep veins of unspecified lower extremity: Secondary | ICD-10-CM

## 2013-05-07 ENCOUNTER — Ambulatory Visit (INDEPENDENT_AMBULATORY_CARE_PROVIDER_SITE_OTHER): Payer: BC Managed Care – PPO | Admitting: Internal Medicine

## 2013-05-07 ENCOUNTER — Encounter: Payer: Self-pay | Admitting: Internal Medicine

## 2013-05-07 VITALS — BP 136/80 | HR 94 | Ht 71.0 in | Wt 264.4 lb

## 2013-05-07 DIAGNOSIS — Z87891 Personal history of nicotine dependence: Secondary | ICD-10-CM

## 2013-05-07 DIAGNOSIS — J310 Chronic rhinitis: Secondary | ICD-10-CM

## 2013-05-07 DIAGNOSIS — G4733 Obstructive sleep apnea (adult) (pediatric): Secondary | ICD-10-CM

## 2013-05-07 NOTE — Progress Notes (Signed)
03/18/13- 51 yoM former smsoker-Dr Shellia Carwin sleep study 2012 [facility] Neurology; has CPAP through [facility] but not using at this time; was told by hospital that he needed O2 at night as well and to let us know today. He falls asleep to easily when sitting quietly. Loud snoring and witnessed apneas. Chronically stuffy nose. No ENT surgery. NPSG 11/24/10- AHI 109.1/ hr, severe obstructive sleep apnea. CPAP was titrated to 14/AHI 0. Body weight was 237 pounds. He did not tolerate CPAP, feeling smothered, but continues to try to use it. He thinks he has gained 35 pounds since the study was done. Bedtime 8 to 9 PM with sleep latency 3-4 hours. Wakes 4 times for bathroom before finally up between 7 and 8 AM. Complicating medical problems include hypertension, coronary disease with cardiac stents, history of stroke. Tobacco abuse averaging one pack per day until quitting 03/10/2012. He was a school custodian, no longer working. Lives with his wife.  05/07/13-  51 yoM former smoker-Dr Shellia Carwin sleep study 2012 [facility] Neurology; has CPAP through [facility] but not using at this time; was told by hospital that he needed O2 at night as well. Hx CAD/ stents, CVA FOLLOWS FOR: would like to change DME from [facility]-will need to discuss wit PCC's. Hopsital stay for heart since last visit. Not wearing CPAP-needs mask. Applying disability. CPAP titration 04/14/13 to 23/ AHI 0. Wore Resmed AirFit F10 medium. 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 See comments  above. The patient may benefit from a formal CT paranasal sinuses  and/or ENT consultation.  ENT consult 04/01/13-Wolicki- large turbinates.  Original Report Authenticated By: Davonna Belling, M.D. CXR 04/22/13 IMPRESSION: No focal consolidation or definite radiographic  evidence of acute cardiopulmonary process.  Original Report  Authenticated By: Jearld Lesch, M.D. PFT --03/23/2013-normal spirometry flows with insignificant response to bronchodilator, slight restriction of total lung capacity, normal diffusion capacity.FVC 3.95/90%, FEV1 3.2 weight/93%, FEV1/FVC 83/103%, FEF 25-75% 3.42/98%. RV 87%, TLC 79%, DLCO 89%.  ROS-see HPI Constitutional:   No-   weight loss, night sweats, fevers, chills, fatigue, lassitude. HEENT:   No-  headaches, difficulty swallowing, tooth/dental problems, sore throat,       No-  sneezing, itching, ear ache, nasal congestion, post nasal drip,  CV:  No-   chest pain, orthopnea, PND, swelling in lower extremities, anasarca,                                  dizziness, palpitations Resp: +  shortness of breath with exertion or at rest.              No-   productive cough,  No non-productive cough,  No- coughing up of blood.              No-   change in color of mucus.  No- wheezing.   Skin: No-   rash or lesions. GI:  No-   heartburn, indigestion, abdominal pain, nausea, vomiting,  GU:  MS:  No-   joint pain or swelling.   Neuro-     nothing unusual Psych:  No- change in mood or affect. No depression or anxiety.  No memory loss.  OBJ- Physical Exam General- Alert, Oriented, Affect-appropriate, Distress- none acute. Overweight Skin- rash-none, lesions- none, excoriation- none Lymphadenopathy- none Head- atraumatic  Eyes- Gross vision intact, PERRLA, conjunctivae and secretions clear            Ears- Hearing, canals-normal            Nose- Clear, no-Septal dev, mucus, polyps, erosion, perforation             Throat- Mallampati III-IV , mucosa clear , drainage- none, tonsils- atrophic. Own teeth. Neck- flexible , trachea midline, no stridor , thyroid nl, carotid no bruit Chest - symmetrical excursion , unlabored           Heart/CV- RRR , no murmur , no gallop  , no rub, nl s1 s2                           - JVD- none , edema+1, stasis changes- none, varices- none            Lung- clear to P&A, wheeze- none, cough- none , dullness-none, rub- none           Chest wall-  Abd-  Br/ Gen/ Rectal- Not done, not indicated Extrem- cyanosis- none, clubbing, none, atrophy- none, strength- nl Neuro- grossly intact to observation

## 2013-05-07 NOTE — Patient Instructions (Addendum)
Order- [facility] patient requests DME change from [facility]     New CPAP 20, mask of choice ( he wants Resmed AirFit F-10, full face, medium), humidifier             Dx OSA

## 2013-05-08 ENCOUNTER — Ambulatory Visit (INDEPENDENT_AMBULATORY_CARE_PROVIDER_SITE_OTHER): Payer: BC Managed Care – PPO | Admitting: Cardiovascular Disease

## 2013-05-08 ENCOUNTER — Encounter: Payer: Self-pay | Admitting: Cardiovascular Disease

## 2013-05-08 ENCOUNTER — Ambulatory Visit (INDEPENDENT_AMBULATORY_CARE_PROVIDER_SITE_OTHER): Payer: BC Managed Care – PPO

## 2013-05-08 VITALS — BP 122/82 | HR 96 | Ht 71.0 in | Wt 267.2 lb

## 2013-05-08 DIAGNOSIS — Q211 Atrial septal defect: Secondary | ICD-10-CM

## 2013-05-08 DIAGNOSIS — Z7901 Long term (current) use of anticoagulants: Secondary | ICD-10-CM

## 2013-05-08 DIAGNOSIS — I2510 Atherosclerotic heart disease of native coronary artery without angina pectoris: Secondary | ICD-10-CM

## 2013-05-08 DIAGNOSIS — I82409 Acute embolism and thrombosis of unspecified deep veins of unspecified lower extremity: Secondary | ICD-10-CM

## 2013-05-08 DIAGNOSIS — I6350 Cerebral infarction due to unspecified occlusion or stenosis of unspecified cerebral artery: Secondary | ICD-10-CM

## 2013-05-08 DIAGNOSIS — I10 Essential (primary) hypertension: Secondary | ICD-10-CM

## 2013-05-08 NOTE — Progress Notes (Signed)
History of Present Illness: 52 yo AAM with history of CAD, HTN, hyperlipidemia, impaired fasting glucose and obesity who is here today for cardiac follow up. I saw him in February 2011 for complaints of chest pain and dizziness. I arranged a stress test, an echo and carotid dopplers at the first visit. The carotid dopplers were normal. He came in for his stress test and complained of neurological changes. He was seen by Dr. Riley Kill and the stress test was cancelled. He was sent over to Dr. Stones office and had MRI/MRA of the head and neck. MRA showed no occlusive disease. MRI showed non-specific white matter changes but no clear infarct, possibly related to hypertension. His echo showed septal and inferobasal hypokinesis with mild concentric hypertrophy, EF 45-50%. Left heart cath October 2011 with severe stenosis in the mid Circumflex. PCI Circumflex with placement of Promus DES 10/13/10. Repeat echo August 2012 with PFO with positive bubble study. I arranged for him to see Dr. Excell Seltzer for PFO closure but he skipped his appointment. He was in [place] April 2013 and had acute onset of left arm and leg weakness. He was admitted there. CT head without acute stroke. Venous dopplers with chronic DVT right femoral vein. Carotids with <50% RICA stenosis, LICA ok. Echo with PFO. Echo 03/14/12 in [place] with normal LV size and function with PFO with positive bubble study. He was started on coumadin and Lovenox and left AMA from the hospital there. [facility]17[facility]12 and discharged home. I saw him in our office 03/19/12. At that time, he continued to have left sided weakness. He had not been compliant with plans for Lovenox and coumadin. He was admitted for Heparin and coumadin. TEE was done 03/21/12: EF normal, atrial septum with suspicion for interatrial septum fenestrations without flow across and few large bubbles noted in LA. This was not felt to require closure. He has been maintained on coumadin and  has been followed in our coumadin clinic.  Venous dopplers 2013 with no DVT.  He has been been diagnosed with OSA and has been started on CPAP. He was admitted to [facility] 04/22/13 with chest pain. Cardiac markers were negative. Cardiac cath 04/24/13 with stable CAD. Patent stent distal Circumflex, mild disease LAD, moderate disease RCA.   He is here today for follow up. He tells me that he has been feeling well. No chest pain or SOB. He has stopped smoking. Tolerating coumadin with no bleeding. Excited to get a new CPAP mask.   Primary Care Physician: [name]  Past Medical History  Diagnosis Date  . GERD (gastroesophageal reflux disease)   . HLD (hyperlipidemia)   . HTN (hypertension)   . Hematuria 8/12  . Sleep apnea     states he does not use CPAP  . PFO (patent foramen ovale)     echo 8/12: EF 55-65%, grade 2 diast dysfnx; +PFO on bubble study;  TEE 4/13: EF normal, atrial septum with suspicion for interatrial septum fenestrations without flow across and few large bubbles noted in LA.  This was not felt to require closure   . CAD (coronary artery disease)     a. s/p promus DES circumflex artery 10/13/10. b. Cath 04/2013: stable disease but possible small vessel disease -Imdur added.  Marland Kitchen DVT (deep venous thrombosis)     "right" 2013  . CVA (cerebral vascular accident) 02/2010  . TIA (transient ischemic attack)   . Arthritis     "knees" (04/22/2013)  .  Rheumatoid arthritis(714.0)     Past Surgical History  Procedure Laterality Date  . Knee arthroscopy Bilateral 2000's    2 on left and 3 on rt  . Tee without cardioversion  03/21/2012    Procedure: TRANSESOPHAGEAL ECHOCARDIOGRAM (TEE);  Surgeon: Pricilla Riffle, MD;  Location: [facility] ENDOSCOPY;  Service: Cardiovascular;  Laterality: N/A;  . Coronary angioplasty with stent placement  2012    "1" (04/22/2013)    Current Outpatient Prescriptions  Medication Sig Dispense Refill  . amLODipine (NORVASC) 10 MG tablet TAKE 1 TABLET BY MOUTH DAILY   30 tablet  3  . aspirin EC 81 MG EC tablet Take 1 tablet (81 mg total) by mouth daily.      Marland Kitchen atorvastatin (LIPITOR) 80 MG tablet Take 1 tablet (80 mg total) by mouth at bedtime.  90 tablet  2  . carvedilol (COREG) 12.5 MG tablet Take 1 tablet (12.5 mg total) by mouth 2 (two) times daily.  60 tablet  6  . folic acid (FOLVITE) 1 MG tablet Take 1 tablet (1 mg total) by mouth daily.  90 tablet  3  . isosorbide mononitrate (IMDUR) 30 MG 24 hr tablet Take 1 tablet (30 mg total) by mouth at bedtime.  30 tablet  6  . losartan (COZAAR) 100 MG tablet TAKE 1 TABLET BY MOUTH DAILY  30 tablet  2  . nitroGLYCERIN (NITROSTAT) 0.4 MG SL tablet Place 1 tablet (0.4 mg total) under the tongue every 5 (five) minutes as needed for chest pain (up to 3 doses).  25 tablet  3  . pantoprazole (PROTONIX) 40 MG tablet Take 1 tablet (40 mg total) by mouth daily.  30 tablet  11  . potassium chloride SA (K-DUR,KLOR-CON) 20 MEQ tablet Take 2 tablets (40 mEq total) by mouth 2 (two) times daily.  120 tablet  6  . warfarin (COUMADIN) 7.5 MG tablet Take 7.5 mg by mouth daily.       No current facility-administered medications for this visit.    Allergies  Allergen Reactions  . Iodine     REACTION: anaphylaxis    History   Social History  . Marital Status: Married    Spouse Name: N/A    Number of Children: N/A  . Years of Education: N/A   Occupational History  . Not on file.   Social History Main Topics  . Smoking status: Former Smoker -- 1.00 packs/day for 35 years    Types: Cigarettes    Quit date: 03/10/2012  . Smokeless tobacco: Never Used  . Alcohol Use: No  . Drug Use: No  . Sexually Active: Not Currently   Other Topics Concern  . Not on file   Social History Narrative   Married, 5 kids; lives in [place]. 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     Family History  Problem Relation Age of Onset  . Cancer Neg Hx   . Kidney disease Neg Hx   . Diabetes Neg Hx     Review of  Systems:  As stated in the HPI and otherwise negative.   BP 122/82  Pulse 96  Ht 5\' 11"  (1.803 m)  Wt 267 lb 3.2 oz (121.201 kg)  BMI 37.28 kg/m2  SpO2 97%  Physical Examination: General: Well developed, well nourished, NAD HEENT: OP clear, mucus membranes moist SKIN: warm, dry. No rashes. Neuro: No focal deficits Musculoskeletal: Muscle strength 5/5 all ext Psychiatric: Mood and affect normal Neck: No JVD, no  carotid bruits, no thyromegaly, no lymphadenopathy. Lungs:Clear bilaterally, no wheezes, rhonci, crackles Cardiovascular: Regular rate and rhythm. No murmurs, gallops or rubs. Abdomen:Soft. Bowel sounds present. Non-tender.  Extremities: No lower extremity edema. Pulses are 2 + in the bilateral DP/PT.  EKG:  Cardiac cath 04/24/13: Left main: No obstructive disease noted.  Left Anterior Descending Artery: Large caliber vessel that does not reach the apex. There are minor luminal irregularities in the mid vessel. The diagonal branch is moderate in caliber and has no obstructive disease.  Circumflex Artery: Large caliber vessel with moderate caliber intermediate branch followed by two obtuse marginal branches. The intermediate branch has minor plaque disease. The two obtuse marginal branches have minor plaque disease. The stent in the distal AV groove Circumflex is patent with no restenosis.  Right Coronary Artery: Large caliber, dominant vessel that wraps around the apex. The proximal vessel has minor luminal irregularities. The mid vessel has diffuse 30% stenosis. The PDA is very large and reaches the apex. The PDA has minor plaque disease. The Posterolateral branch is a moderate caliber bifurcating vessel. The ostium of the posterolateral branch has 40% stenosis. The distal portion of this vessel become small in caliber then bifurcates and there is a 70% stenosis at the bifurcation involving both the superior and inferior sub-branches. This is unchanged in appearance since his last  cath in 2011. This vessel is too small for PCI.  Left Ventricular Angiogram: LVEF=55-60%.    Assessment and Plan:   1. CAD, NATIVE VESSEL: Stable. Will continue ASA, statin, beta blocker, ARB, Imdur.   2. DVT: Will continue coumadin. This is also used for his neurological changes with possible very small PFO and prior CVA/TIA.   3. GERD: Improved. cont Protonix 40 mg po QDaily.   4. HYPERTENSION : BP controlled. Will continue Cozaar, Norvasc and Coreg.   5. PATENT FORAMEN OVALE: No evidence of significant PFO on TEE. He is on coumadin for small fenestrations in atrial septum and neurological changes. Recommend long term anti-coagulation.   6. OSA with daytime somnolence: He has seen Dr. Maple Hudson and is being fitted for a CPAP mask.

## 2013-05-08 NOTE — Patient Instructions (Addendum)
Your physician wants you to follow-up in:  6 months. You will receive a reminder letter in the mail two months in advance. If you don't receive a letter, please call our office to schedule the follow-up appointment.   

## 2013-05-17 NOTE — Assessment & Plan Note (Signed)
Essentially normal PFT despite history of smoking

## 2013-05-17 NOTE — Assessment & Plan Note (Signed)
-   No acute intervention ?

## 2013-05-17 NOTE — Assessment & Plan Note (Signed)
Plan-he requests change DME company. His titrated pressure is too high for CPAP. We will try bilevel 23/23. May need to reduce the expiratory pressure.

## 2013-05-22 ENCOUNTER — Ambulatory Visit (INDEPENDENT_AMBULATORY_CARE_PROVIDER_SITE_OTHER): Payer: BC Managed Care – PPO

## 2013-05-22 DIAGNOSIS — I82409 Acute embolism and thrombosis of unspecified deep veins of unspecified lower extremity: Secondary | ICD-10-CM

## 2013-05-22 DIAGNOSIS — Z7901 Long term (current) use of anticoagulants: Secondary | ICD-10-CM

## 2013-05-22 DIAGNOSIS — I6350 Cerebral infarction due to unspecified occlusion or stenosis of unspecified cerebral artery: Secondary | ICD-10-CM

## 2013-05-22 LAB — POCT INR: INR: 1.4

## 2013-06-01 ENCOUNTER — Ambulatory Visit (INDEPENDENT_AMBULATORY_CARE_PROVIDER_SITE_OTHER): Payer: BC Managed Care – PPO | Admitting: *Deleted

## 2013-06-01 DIAGNOSIS — Z7901 Long term (current) use of anticoagulants: Secondary | ICD-10-CM

## 2013-06-01 DIAGNOSIS — I82409 Acute embolism and thrombosis of unspecified deep veins of unspecified lower extremity: Secondary | ICD-10-CM

## 2013-06-01 DIAGNOSIS — I6350 Cerebral infarction due to unspecified occlusion or stenosis of unspecified cerebral artery: Secondary | ICD-10-CM

## 2013-06-01 LAB — POCT INR: INR: 3.3

## 2013-06-06 ENCOUNTER — Other Ambulatory Visit: Payer: Self-pay | Admitting: Cardiovascular Disease

## 2013-06-11 ENCOUNTER — Ambulatory Visit (INDEPENDENT_AMBULATORY_CARE_PROVIDER_SITE_OTHER): Payer: BC Managed Care – PPO | Admitting: *Deleted

## 2013-06-11 DIAGNOSIS — I6350 Cerebral infarction due to unspecified occlusion or stenosis of unspecified cerebral artery: Secondary | ICD-10-CM

## 2013-06-11 DIAGNOSIS — Z7901 Long term (current) use of anticoagulants: Secondary | ICD-10-CM

## 2013-06-11 DIAGNOSIS — I82409 Acute embolism and thrombosis of unspecified deep veins of unspecified lower extremity: Secondary | ICD-10-CM

## 2013-06-11 LAB — POCT INR: INR: 2.2

## 2013-06-16 ENCOUNTER — Ambulatory Visit (INDEPENDENT_AMBULATORY_CARE_PROVIDER_SITE_OTHER): Payer: BC Managed Care – PPO | Admitting: Internal Medicine

## 2013-06-16 ENCOUNTER — Encounter: Payer: Self-pay | Admitting: Internal Medicine

## 2013-06-16 VITALS — BP 120/74 | HR 80 | Temp 98.5°F | Resp 16 | Ht 71.0 in | Wt 277.0 lb

## 2013-06-16 DIAGNOSIS — I10 Essential (primary) hypertension: Secondary | ICD-10-CM

## 2013-06-16 DIAGNOSIS — I69359 Hemiplegia and hemiparesis following cerebral infarction affecting unspecified side: Secondary | ICD-10-CM

## 2013-06-16 DIAGNOSIS — I69959 Hemiplegia and hemiparesis following unspecified cerebrovascular disease affecting unspecified side: Secondary | ICD-10-CM

## 2013-06-16 NOTE — Assessment & Plan Note (Signed)
I think the trace edema is caused by amlodipine so I have asked him to stop it Will recheck his BP in 2 months

## 2013-06-16 NOTE — Progress Notes (Signed)
Subjective:    Patient ID: Tony Davis, male    DOB: 03-Feb-1961, 52 y.o.   MRN: [id number]  Hypertension This is a chronic problem. The current episode started more than 1 year ago. The problem is unchanged. The problem is controlled. Associated symptoms include peripheral edema and shortness of breath. Pertinent negatives include no anxiety, blurred vision, chest pain, headaches, malaise/fatigue, neck pain, orthopnea, palpitations, PND or sweats. There are no associated agents to hypertension. Past treatments include beta blockers, calcium channel blockers and angiotensin blockers. The current treatment provides moderate improvement. Compliance problems include medication side effects, exercise and diet (foot edema).  Hypertensive end-organ damage includes CAD/MI and CVA. Identifiable causes of hypertension include sleep apnea.      Review of Systems  Constitutional: Positive for fatigue and unexpected weight change. Negative for fever, chills, malaise/fatigue, diaphoresis, activity change and appetite change.  HENT: Negative.  Negative for neck pain.   Eyes: Negative.  Negative for blurred vision.  Respiratory: Positive for apnea and shortness of breath. Negative for cough, choking, chest tightness, wheezing and stridor.   Cardiovascular: Positive for leg swelling (feet and ankles). Negative for chest pain, palpitations, orthopnea and PND.  Gastrointestinal: Negative.  Negative for abdominal pain.  Endocrine: Negative.   Genitourinary: Negative.   Musculoskeletal: Negative.   Skin: Negative.   Allergic/Immunologic: Negative.   Neurological: Positive for weakness and numbness. Negative for dizziness, tremors, seizures, syncope, facial asymmetry, speech difficulty, light-headedness and headaches.  Hematological: Negative.  Negative for adenopathy. Does not bruise/bleed easily.  Psychiatric/Behavioral: Negative.        Objective:   Physical Exam  Vitals reviewed. Constitutional:  He is oriented to person, place, and time. He appears well-developed and well-nourished. No distress.  HENT:  Head: Normocephalic and atraumatic.  Mouth/Throat: Oropharynx is clear and moist. No oropharyngeal exudate.  Eyes: Conjunctivae are normal. Right eye exhibits no discharge. Left eye exhibits no discharge. No scleral icterus.  Neck: Normal range of motion. Neck supple. No JVD present. No tracheal deviation present. No thyromegaly present.  Cardiovascular: Normal rate, regular rhythm, normal heart sounds and intact distal pulses.  Exam reveals no gallop and no friction rub.   No murmur heard. Pulmonary/Chest: Effort normal and breath sounds normal. No stridor. No respiratory distress. He has no wheezes. He has no rales. He exhibits no tenderness.  Abdominal: Soft. Bowel sounds are normal. He exhibits no distension and no mass. There is no tenderness. There is no rebound and no guarding.  Musculoskeletal: Normal range of motion. He exhibits edema (trace edema in both ankles and feet). He exhibits no tenderness.  Lymphadenopathy:    He has no cervical adenopathy.  Neurological: He is oriented to person, place, and time.  Skin: Skin is warm and dry. No rash noted. He is not diaphoretic. No erythema. No pallor.  Psychiatric: He has a normal mood and affect. His behavior is normal. Judgment and thought content normal.     Lab Results  Component Value Date   WBC 8.1 04/23/2013   HGB 13.2 04/23/2013   HCT 40.4 04/23/2013   PLT 307 04/23/2013   GLUCOSE 123* 04/24/2013   CHOL 240* 04/23/2013   TRIG 170* 04/23/2013   HDL 27* 04/23/2013   LDLDIRECT 154.3 03/06/2013   LDLCALC 179* 04/23/2013   ALT 16 04/22/2013   AST 16 04/22/2013   NA 138 04/24/2013   K 3.6 04/24/2013   CL 101 04/24/2013   CREATININE 0.98 04/24/2013   BUN 14 04/24/2013  CO2 29 04/24/2013   TSH 1.693 04/22/2013   PSA 0.38 08/06/2011   INR 2.2 06/11/2013   HGBA1C 6.2* 04/22/2013       Assessment & Plan:

## 2013-06-16 NOTE — Assessment & Plan Note (Signed)
He has no new neuro s/s Disability paperwork was completed today

## 2013-06-16 NOTE — Patient Instructions (Signed)

## 2013-06-25 ENCOUNTER — Ambulatory Visit (INDEPENDENT_AMBULATORY_CARE_PROVIDER_SITE_OTHER): Payer: BC Managed Care – PPO | Admitting: *Deleted

## 2013-06-25 DIAGNOSIS — I82409 Acute embolism and thrombosis of unspecified deep veins of unspecified lower extremity: Secondary | ICD-10-CM

## 2013-06-25 DIAGNOSIS — I6350 Cerebral infarction due to unspecified occlusion or stenosis of unspecified cerebral artery: Secondary | ICD-10-CM

## 2013-06-25 DIAGNOSIS — Z7901 Long term (current) use of anticoagulants: Secondary | ICD-10-CM

## 2013-06-25 LAB — POCT INR: INR: 2.4

## 2013-06-26 ENCOUNTER — Ambulatory Visit (INDEPENDENT_AMBULATORY_CARE_PROVIDER_SITE_OTHER): Payer: BC Managed Care – PPO | Admitting: Internal Medicine

## 2013-06-26 ENCOUNTER — Encounter: Payer: Self-pay | Admitting: Internal Medicine

## 2013-06-26 VITALS — BP 134/94 | HR 77 | Ht 71.0 in | Wt 277.6 lb

## 2013-06-26 DIAGNOSIS — G4733 Obstructive sleep apnea (adult) (pediatric): Secondary | ICD-10-CM

## 2013-06-26 DIAGNOSIS — J310 Chronic rhinitis: Secondary | ICD-10-CM

## 2013-06-26 MED ORDER — MOMETASONE FUROATE 50 MCG/ACT NA SUSP: NASAL | Status: DC

## 2013-06-26 NOTE — Progress Notes (Signed)
03/18/13- 51 yoM former smsoker-Dr Shellia Carwin sleep study 2012 [facility] Neurology; has CPAP through [facility] but not using at this time; was told by hospital that he needed O2 at night as well and to let us know today. He falls asleep to easily when sitting quietly. Loud snoring and witnessed apneas. Chronically stuffy nose. No ENT surgery. NPSG 11/24/10- AHI 109.1/ hr, severe obstructive sleep apnea. CPAP was titrated to 14/AHI 0. Body weight was 237 pounds. He did not tolerate CPAP, feeling smothered, but continues to try to use it. He thinks he has gained 35 pounds since the study was done. Bedtime 8 to 9 PM with sleep latency 3-4 hours. Wakes 4 times for bathroom before finally up between 7 and 8 AM. Complicating medical problems include hypertension, coronary disease with cardiac stents, history of stroke. Tobacco abuse averaging one pack per day until quitting 03/10/2012. He was a school custodian, no longer working. Lives with his wife.  05/07/13-  51 yoM former smoker-Dr Shellia Carwin sleep study 2012 [facility]rology; has CPAP through [facility] but not using at this time; was told by hospital that he needed O2 at night as well. Hx CAD/ stents, CVA FOLLOWS FOR: would like to change DME from [facility]-will need to discuss wit PCC's. Hopsital stay for heart since last visit. Not wearing CPAP-needs mask. Applying disability. CPAP titration 04/14/13 to 23/ AHI 0. Wore Resmed AirFit F10 medium. 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 See comments  above. The patient may benefit from a formal CT paranasal sinuses  and/or ENT consultation.  ENT consult 04/01/13-Wolicki- large turbinates.  Original Report Authenticated By: Davonna Belling, M.D. CXR 04/22/13 IMPRESSION: No focal consolidation or definite radiographic  evidence of acute cardiopulmonary process.  Original Report  Authenticated By: Jearld Lesch, M.D. PFT --03/23/2013-normal spirometry flows with insignificant response to bronchodilator, slight restriction of total lung capacity, normal diffusion capacity.FVC 3.95/90%, FEV1 3.2 weight/93%, FEV1/FVC 83/103%, FEF 25-75% 3.42/98%. RV 87%, TLC 79%, DLCO 89%.  06/26/13- 51 yoM former smoker-Dr Shellia Carwin sleep study 2012 [facility]rology; has CPAP through [facility] but not using at this time; was told by hospital that he needed O2 at night as well. Hx CAD/ stents, CVA 6 wk follow up.  Wearing cpap 20/ [facility] DME every night.  Breathing through mouth with mask on.  Wakes up with very dry mouth.using a fullface mask with humidifier. Download confirms good compliance and control. Complains of nasal congestion. History of known prominent turbinates.  ROS-see HPI Constitutional:   No-   weight loss, night sweats, fevers, chills, fatigue, lassitude. HEENT:   No-  headaches, difficulty swallowing, tooth/dental problems, sore throat,       No-  sneezing, itching, ear ache, +nasal congestion, post nasal drip,  CV:  No-   chest pain, orthopnea, PND, swelling in lower extremities, anasarca, dizziness, palpitations Resp: +  shortness of breath with exertion or at rest.              No-   productive cough,  No non-productive cough,  No- coughing up of blood.              No-   change in color of mucus.  No- wheezing.   Skin: No-   rash or lesions. GI:  No-   heartburn, indigestion, abdominal pain, nausea, vomiting,  GU:  MS:  No-   joint pain or swelling.  Neuro-     nothing unusual Psych:  No- change in mood or affect. No depression or anxiety.  No memory loss.  OBJ- Physical Exam General- Alert, Oriented, Affect-appropriate, Distress- none acute. Overweight Skin- rash-none, lesions- none, excoriation- none Lymphadenopathy- none Head- atraumatic            Eyes- Gross vision intact, PERRLA, conjunctivae and secretions clear            Ears- Hearing,  canals-normal            Nose- turbinate hypertrophy, no-Septal dev, mucus, polyps, erosion, perforation             Throat- Mallampati III-IV , mucosa clear , drainage- none, tonsils- atrophic. Own teeth. Neck- flexible , trachea midline, no stridor , thyroid nl, carotid no bruit Chest - symmetrical excursion , unlabored           Heart/CV- RRR , no murmur , no gallop  , no rub, nl s1 s2                           - JVD- none , edema-none, stasis changes- none, varices- none           Lung- clear to P&A, wheeze- none, cough- none , dullness-none, rub- none           Chest wall-  Abd-  Br/ Gen/ Rectal- Not done, not indicated Extrem- cyanosis- none, clubbing, none, atrophy- none, strength- nl Neuro- grossly intact to observation

## 2013-06-26 NOTE — Patient Instructions (Addendum)
Sample Nasonex nasal steroid spray   1-2 puffs each nostril once daily at bedtime. If it seems to help, you could try otc Nasacort, which is similar. Ok if you have to, to put 1 puff of Afrin in each nostril, but only once daily at bedtime.  Try otc Biotene otc for dry mouth  Order- DME [facility]  Reduce CPAP pressure to 18,             dx OSA

## 2013-06-29 ENCOUNTER — Other Ambulatory Visit: Payer: Self-pay | Admitting: Cardiovascular Disease

## 2013-06-29 ENCOUNTER — Other Ambulatory Visit (HOSPITAL_COMMUNITY): Payer: Self-pay | Admitting: Physician Assistant

## 2013-07-01 ENCOUNTER — Other Ambulatory Visit: Payer: Self-pay

## 2013-07-01 ENCOUNTER — Other Ambulatory Visit: Payer: Self-pay | Admitting: Cardiovascular Disease

## 2013-07-01 NOTE — Telephone Encounter (Signed)
ERROR - Refill already completed

## 2013-07-11 ENCOUNTER — Other Ambulatory Visit: Payer: Self-pay | Admitting: Cardiovascular Disease

## 2013-07-12 NOTE — Assessment & Plan Note (Signed)
Large turbinates. We will see how he does with lower airflow through his CPAP, a nasal steroid. He may need to talk with Dr.Wolicki about reduction of turbinates.

## 2013-07-12 NOTE — Assessment & Plan Note (Signed)
He is getting good enough control. Unfortunately poor nasal airway is interfering with his tolerance. He might be a candidate for reduction of turbinates by ENT. Plan-first we are going to try reducing pressure flow to 18, with report from his wife on whether he starts snoring. Nasonex. Biotene for dry mouth.

## 2013-07-16 ENCOUNTER — Ambulatory Visit (INDEPENDENT_AMBULATORY_CARE_PROVIDER_SITE_OTHER): Payer: BC Managed Care – PPO | Admitting: Internal Medicine

## 2013-07-16 ENCOUNTER — Other Ambulatory Visit: Payer: Self-pay | Admitting: Cardiovascular Disease

## 2013-07-16 ENCOUNTER — Encounter: Payer: Self-pay | Admitting: Internal Medicine

## 2013-07-16 VITALS — BP 146/78 | HR 77 | Temp 98.2°F | Resp 16 | Wt 283.0 lb

## 2013-07-16 DIAGNOSIS — M1710 Unilateral primary osteoarthritis, unspecified knee: Secondary | ICD-10-CM

## 2013-07-16 DIAGNOSIS — I10 Essential (primary) hypertension: Secondary | ICD-10-CM

## 2013-07-16 DIAGNOSIS — IMO0002 Reserved for concepts with insufficient information to code with codable children: Secondary | ICD-10-CM

## 2013-07-16 MED ORDER — HYDROCODONE-ACETAMINOPHEN 10-325 MG PO TABS: 1.0000 | ORAL_TABLET | Freq: Three times a day (TID) | ORAL | Status: DC | PRN

## 2013-07-16 MED ORDER — HYDROCHLOROTHIAZIDE 12.5 MG PO CAPS: 12.5000 mg | ORAL_CAPSULE | Freq: Every day | ORAL | Status: DC

## 2013-07-16 NOTE — Progress Notes (Signed)
Subjective:    Patient ID: Tony Davis, male    DOB: [birth year], 52 y.o.   MRN: [id number]  Hypertension This is a chronic problem. The current episode started more than 1 year ago. The problem has been gradually worsening since onset. The problem is uncontrolled. Associated symptoms include peripheral edema. Pertinent negatives include no anxiety, blurred vision, chest pain, headaches, malaise/fatigue, neck pain, orthopnea, palpitations, PND, shortness of breath or sweats. Past treatments include beta blockers and angiotensin blockers. The current treatment provides moderate improvement. Compliance problems include exercise and diet.  Hypertensive end-organ damage includes CVA. Identifiable causes of hypertension include sleep apnea.      Review of Systems  Constitutional: Negative.  Negative for fever, chills, malaise/fatigue, diaphoresis, activity change, appetite change, fatigue and unexpected weight change.  HENT: Negative.  Negative for neck pain.   Eyes: Negative.  Negative for blurred vision.  Respiratory: Negative.  Negative for shortness of breath.   Cardiovascular: Positive for leg swelling (swelling around both ankles). Negative for chest pain, palpitations, orthopnea and PND.  Gastrointestinal: Negative.  Negative for nausea, vomiting, abdominal pain, diarrhea, constipation and blood in stool.  Endocrine: Negative.   Genitourinary: Negative.   Musculoskeletal: Positive for arthralgias (both knees). Negative for myalgias, back pain, joint swelling and gait problem.  Skin: Negative.   Allergic/Immunologic: Negative.   Neurological: Negative.  Negative for dizziness, tremors, weakness, light-headedness and headaches.  Hematological: Negative.  Negative for adenopathy. Does not bruise/bleed easily.  Psychiatric/Behavioral: Negative.        Objective:   Physical Exam  Vitals reviewed. Constitutional: He is oriented to person, place, and time. He appears well-developed  and well-nourished. No distress.  HENT:  Head: Normocephalic and atraumatic.  Mouth/Throat: Oropharynx is clear and moist. No oropharyngeal exudate.  Eyes: Conjunctivae are normal. Right eye exhibits no discharge. Left eye exhibits no discharge. No scleral icterus.  Neck: Normal range of motion. Neck supple. No JVD present. No tracheal deviation present. No thyromegaly present.  Cardiovascular: Normal rate, regular rhythm, normal heart sounds and intact distal pulses.  Exam reveals no gallop and no friction rub.   No murmur heard. Pulmonary/Chest: Effort normal and breath sounds normal. No stridor. No respiratory distress. He has no wheezes. He has no rales. He exhibits no tenderness.  Abdominal: Soft. Bowel sounds are normal. He exhibits no distension and no mass. There is no tenderness. There is no rebound and no guarding.  Musculoskeletal: Normal range of motion. He exhibits edema (1+ pitting edema in BLE). He exhibits no tenderness.       Right knee: Normal. He exhibits no swelling and no deformity.       Left knee: Normal. He exhibits no swelling and no deformity.  Lymphadenopathy:    He has no cervical adenopathy.  Neurological: He is oriented to person, place, and time.  Skin: Skin is warm and dry. No rash noted. He is not diaphoretic. No erythema. No pallor.  Psychiatric: He has a normal mood and affect. His behavior is normal. Judgment and thought content normal.    Lab Results  Component Value Date   WBC 8.1 04/23/2013   HGB 13.2 04/23/2013   HCT 40.4 04/23/2013   PLT 307 04/23/2013   GLUCOSE 123* 04/24/2013   CHOL 240* 04/23/2013   TRIG 170* 04/23/2013   HDL 27* 04/23/2013   LDLDIRECT 154.3 03/06/2013   LDLCALC 179* 04/23/2013   ALT 16 04/22/2013   AST 16 04/22/2013   NA 138 04/24/2013   K  3.6 04/24/2013   CL 101 04/24/2013   CREATININE 0.98 04/24/2013   BUN 14 04/24/2013   CO2 29 04/24/2013   TSH 1.693 04/22/2013   PSA 0.38 08/06/2011   INR 2.4 06/25/2013   HGBA1C 6.2* 04/22/2013         Assessment & Plan:

## 2013-07-16 NOTE — Assessment & Plan Note (Signed)
His BP is not well controlled and he has edema so I have asked him to start HCTZ in addition to the other meds

## 2013-07-16 NOTE — Assessment & Plan Note (Signed)
Will control his pain with norco

## 2013-07-16 NOTE — Patient Instructions (Addendum)

## 2013-07-17 ENCOUNTER — Other Ambulatory Visit: Payer: Self-pay | Admitting: Cardiology

## 2013-07-17 MED ORDER — ATORVASTATIN CALCIUM 80 MG PO TABS: 80.0000 mg | ORAL_TABLET | Freq: Every day | ORAL | Status: DC

## 2013-07-23 ENCOUNTER — Ambulatory Visit (INDEPENDENT_AMBULATORY_CARE_PROVIDER_SITE_OTHER): Payer: BC Managed Care – PPO | Admitting: *Deleted

## 2013-07-23 DIAGNOSIS — I82409 Acute embolism and thrombosis of unspecified deep veins of unspecified lower extremity: Secondary | ICD-10-CM

## 2013-07-23 DIAGNOSIS — I6350 Cerebral infarction due to unspecified occlusion or stenosis of unspecified cerebral artery: Secondary | ICD-10-CM

## 2013-07-23 DIAGNOSIS — Z7901 Long term (current) use of anticoagulants: Secondary | ICD-10-CM

## 2013-07-23 LAB — POCT INR: INR: 1.9

## 2013-07-24 ENCOUNTER — Telehealth: Payer: Self-pay | Admitting: *Deleted

## 2013-07-24 NOTE — Telephone Encounter (Signed)
Spoke with pt and appointment made for him to see Tereso Newcomer, PA on July 27, 2013 at 9:10

## 2013-07-24 NOTE — Telephone Encounter (Signed)
Tony Bible, can we work him onto my schedule or Scott's schedule for next week? cdm

## 2013-07-24 NOTE — Telephone Encounter (Signed)
Received message from Tony Davis, [place] in coumadin clinic that pt would like me to call him to discuss weight gain, increased edema and possible knee surgery. Phone number is [phone]

## 2013-07-24 NOTE — Telephone Encounter (Signed)
Spoke with pt. He reports he saw Dr. Thurston Hole recently and it was determined he needs both knees replaced. Pt reports Dr. Thurston Hole does not feel he is a good surgical candidate and was going to contact Dr. Clifton James to discuss.  Pt also reports he is continuing to have swelling in both lower legs. Legs are painful and it is difficult to get his shoes on at times. Saw Dr. Meredith Pel recently and was started on HCTZ but pt reports he does not think this is helping. He states he has a recliner and tries to keep legs elevated.  He reports occasional shortness of breath with ambulation. Reports this is new in the last 2 weeks.  Weight on 8/11 was 276 lbs and on 8/14 was 280 lbs. Did not weigh today and does not know any other weights.

## 2013-07-27 ENCOUNTER — Encounter: Payer: Self-pay | Admitting: Physician Assistant

## 2013-07-27 ENCOUNTER — Ambulatory Visit (INDEPENDENT_AMBULATORY_CARE_PROVIDER_SITE_OTHER): Payer: BC Managed Care – PPO | Admitting: Physician Assistant

## 2013-07-27 ENCOUNTER — Other Ambulatory Visit: Payer: Self-pay | Admitting: Cardiovascular Disease

## 2013-07-27 VITALS — BP 138/92 | HR 68 | Ht 71.0 in | Wt 279.0 lb

## 2013-07-27 DIAGNOSIS — Q2112 Patent foramen ovale: Secondary | ICD-10-CM

## 2013-07-27 DIAGNOSIS — Q211 Atrial septal defect: Secondary | ICD-10-CM

## 2013-07-27 DIAGNOSIS — IMO0002 Reserved for concepts with insufficient information to code with codable children: Secondary | ICD-10-CM

## 2013-07-27 DIAGNOSIS — R609 Edema, unspecified: Secondary | ICD-10-CM

## 2013-07-27 DIAGNOSIS — I10 Essential (primary) hypertension: Secondary | ICD-10-CM

## 2013-07-27 DIAGNOSIS — M1710 Unilateral primary osteoarthritis, unspecified knee: Secondary | ICD-10-CM

## 2013-07-27 DIAGNOSIS — M179 Osteoarthritis of knee, unspecified: Secondary | ICD-10-CM

## 2013-07-27 DIAGNOSIS — E785 Hyperlipidemia, unspecified: Secondary | ICD-10-CM

## 2013-07-27 DIAGNOSIS — R0602 Shortness of breath: Secondary | ICD-10-CM

## 2013-07-27 DIAGNOSIS — I2510 Atherosclerotic heart disease of native coronary artery without angina pectoris: Secondary | ICD-10-CM

## 2013-07-27 LAB — BASIC METABOLIC PANEL
Calcium: 9.3 mg/dL (ref 8.4–10.5)
Chloride: 104 mEq/L (ref 96–112)
Creatinine, Ser: 1.2 mg/dL (ref 0.4–1.5)
Sodium: 140 mEq/L (ref 135–145)

## 2013-07-27 LAB — BRAIN NATRIURETIC PEPTIDE: Pro B Natriuretic peptide (BNP): 16 pg/mL (ref 0.0–100.0)

## 2013-07-27 MED ORDER — FUROSEMIDE 20 MG PO TABS: 20.0000 mg | ORAL_TABLET | Freq: Every day | ORAL | Status: DC

## 2013-07-27 NOTE — Progress Notes (Addendum)
[address] [name], [place]  [address] Phone: [phone] Fax:  [phone]  Date:  07/27/2013   ID:  Tony Davis, DOB December 25, 1960, MRN [id number]  PCP:  [name], MD  Cardiologist:  Dr. Verne Carrow     History of Present Illness: Tony Davis is a 52 y.o. male who returns for evaluation of LE edema.  He has a history of CAD, HTN, HL, PFO, LE DVT, glucose intolerance and obesity. Stress test and carotid Dopplers were arranged for chest pain and dizziness in 01/2010. Carotid Dopplers were normal. Patient complained of neurologic changes at the time of his stress test, and this was canceled. MRI/MRA of the head and neck showed no occlusive disease and nonspecific white matter changes but no clear infarct, possibly related to hypertension. Echo demonstrated septal and infero-basal HK, mild LVH, EF 45-50%. LHC  09/2010: Severe stenosis in the mid CFX which was treated with a Promus DES 10/13/2010. Echo 07/2011 with PFO with positive bubble study. He was to see Dr. Excell Seltzer but missed this appointment. He was seen in [place] 03/2012 with acute onset of left arm and leg weakness. Head CT was negative for acute CVA. Venous Dopplers demonstrated chronic DVT right femoral vein. Carotids with less than 50% RICA stenosis, LICA okay.  Echo demonstrated PFO. He had normal LV size and function. He was started on Coumadin and Lovenox and left the hospital there AMA. He was again seen in [facility] ED 03/2012 and discharged home. He had left-sided weakness during follow up 03/2012 and was admitted to the hospital for heparin and Coumadin. TEE 03/2012: EF normal, atrial septal with suspicion for intra-atrial septum fenestrations without flow across and few large bubbles noted in LA. This was not felt to require closure. He has been kept on Coumadin. Venous Dopplers in 2013 were negative for DVT. Admitted 04/2013 with chest pain.  LHC 04/24/13: Distal AV groove circumflex  stent patent, mid RCA 30%, ostial PL branch 40%, 70% at the bifurcation of the distal PL branch involving both superior and inferior sub-branches-unchanged since 2011 (too small for PCI), EF 55-60%. Medical therapy continued.  He has significant, bilateral knee DJD. He has been told that he will need bilateral knee replacements.  Over the last several weeks, he has noted increased LE edema. His PCP placed him on HCTZ. He has not noticed significant improvement with this. His legs are typically not swollen in the morning. It gets worse throughout the day. He walks with a cane due to his arthritis. He is unable to go up steps or go up hills. He does note some shortness of breath. He probably describes NYHA class IIb symptoms. He denies orthopnea or PND. He denies chest pain or syncope.  Labs (5/14):   K 3.6, creatinine 0.98, ALT 16, LDL 179, Hgb 13.2, TSH 1.69  Wt Readings from Last 3 Encounters:  07/27/13 279 lb (126.554 kg)  07/16/13 283 lb (128.368 kg)  06/26/13 277 lb 9.6 oz (125.919 kg)     Past Medical History  Diagnosis Date  . GERD (gastroesophageal reflux disease)   . HLD (hyperlipidemia)   . HTN (hypertension)   . Hematuria 8/12  . Sleep apnea     states he does not use CPAP  . PFO (patent foramen ovale)     echo 8/12: EF 55-65%, grade 2 diast dysfnx; +PFO on bubble study;  TEE 4/13: EF normal, atrial septum with suspicion for interatrial septum fenestrations  without flow across and few large bubbles noted in LA.  This was not felt to require closure   . CAD (coronary artery disease)     a. s/p promus DES circumflex artery 10/13/10. b. Cath 04/2013: stable disease but possible small vessel disease -Imdur added.  Marland Kitchen DVT (deep venous thrombosis)     "right" 2013  . CVA (cerebral vascular accident) 02/2010  . TIA (transient ischemic attack)   . Arthritis     "knees" (04/22/2013)  . Rheumatoid arthritis(714.0)     Current Outpatient Prescriptions  Medication Sig Dispense Refill  .  aspirin EC 81 MG EC tablet Take 1 tablet (81 mg total) by mouth daily.      Marland Kitchen atorvastatin (LIPITOR) 80 MG tablet Take 1 tablet (80 mg total) by mouth at bedtime.  90 tablet  1  . carvedilol (COREG) 12.5 MG tablet TAKE 1 TABLET TWICE A DAY  60 tablet  3  . folic acid (FOLVITE) 1 MG tablet Take 1 tablet (1 mg total) by mouth daily.  90 tablet  3  . hydrochlorothiazide (MICROZIDE) 12.5 MG capsule Take 1 capsule (12.5 mg total) by mouth daily.  90 capsule  1  . HYDROcodone-acetaminophen (NORCO) 10-325 MG per tablet Take 1 tablet by mouth every 8 (eight) hours as needed for pain.  90 tablet  3  . isosorbide mononitrate (IMDUR) 30 MG 24 hr tablet Take 1 tablet (30 mg total) by mouth at bedtime.  30 tablet  6  . KLOR-CON M20 20 MEQ tablet TAKE 2 TABLETS (40 MEQ TOTAL) BY MOUTH 2 (TWO) TIMES DAILY.  120 tablet  3  . losartan (COZAAR) 100 MG tablet TAKE 1 TABLET BY MOUTH DAILY  30 tablet  5  . mometasone (NASONEX) 50 MCG/ACT nasal spray 1-2 puffs in each nostril once daily at bedtime.  7.5 g  0  . nitroGLYCERIN (NITROSTAT) 0.4 MG SL tablet Place 1 tablet (0.4 mg total) under the tongue every 5 (five) minutes as needed for chest pain (up to 3 doses).  25 tablet  3  . pantoprazole (PROTONIX) 40 MG tablet TAKE 1 TABLET EVERY DAY  30 tablet  5  . warfarin (COUMADIN) 7.5 MG tablet TAKE AS DIRECTED BY COUMADIN CLINIC .  40 tablet  3   No current facility-administered medications for this visit.    Allergies:    Allergies  Allergen Reactions  . Iodine     REACTION: anaphylaxis  . Amlodipine     Foot edema    Social History:  The patient  reports that he quit smoking about 16 months ago. His smoking use included Cigarettes. He has a 35 pack-year smoking history. He has never used smokeless tobacco. He reports that he does not drink alcohol or use illicit drugs.   ROS:  Please see the history of present illness.      All other systems reviewed and negative.   PHYSICAL EXAM: VS:  BP 138/92  Pulse 68   Ht 5\' 11"  (1.803 m)  Wt 279 lb (126.554 kg)  BMI 38.93 kg/m2 Well nourished, well developed, in no acute distress HEENT: normal Neck: no JVD Cardiac:  normal S1, S2; RRR; no murmur Lungs:  clear to auscultation bilaterally, no wheezing, rhonchi or rales Abd: soft, nontender, no hepatomegaly Ext: trace bilateral LE edema Skin: warm and dry Neuro:  CNs 2-12 intact, no focal abnormalities noted  EKG:  NSR, HR 68, normal axis, no acute changes     ASSESSMENT AND PLAN:  1. Edema: This is dependent edema and likely exacerbated by his bilateral knee arthritis. He does not really have any significant signs of volume overload on exam.  He did have moderate diastolic dysfunction on echocardiogram 03/2012. Ejection fraction has been normal in the past and was normal on recent heart catheterization. TSH was recently normal as well as his hemoglobin. I have recommended discontinuing his HCTZ and trying Lasix 20 mg a day. He can increase this to 40 mg as needed for increased swelling. I have also recommended bilateral compression stockings (thigh-high). Check a basic metabolic panel and BNP today. Repeat a basic metabolic panel in one week. 2. Dyspnea: I suspect that this is related to increased work to overcome significant bilateral knee pain. Recent LHC with patent stent and stable anatomy.  Check a BNP today as noted. If this is significantly elevated, I will repeat an echocardiogram. 3. CAD: No angina. Continue aspirin and statin. 4. Hypertension: Fair control. Continue current therapy. 5. Hyperlipidemia: Continue statin. 6. DVT: He remains on Coumadin due to history of PFO and potential TIA in the past. 7. Knee DJD: He does not have any unstable cardiac conditions. He had a recent LHC with patent stent in stable anatomy. He does not require any further cardiac workup prior to noncardiac surgery. However, with his history of PFO, I am not certain what the recommendation would be for his anticoagulation  at the time of surgery. I will review this with Dr. Verne Carrow. 8. Disposition:  F/u with Dr. Verne Carrow as planned.   Luna Glasgow, PA-C  07/27/2013 10:11 AM    Addendum 07/29/2013 4:19 PM: Reviewed with Dr. Verne Carrow.   Given his significant neurologic events off of anticoagulation in the past, recommendation is to bridge with Lovenox should he need to come off of coumadin for any surgery. Our office can certainly help arrange Lovenox bridging should he plan to proceed with surgery.  Signed,  Tereso Newcomer, PA-C   07/29/2013 4:20 PM

## 2013-07-27 NOTE — Patient Instructions (Addendum)
STOP HCTZ  START LASIX 20 MG DAILY ; CAN TAKE EXTRA 20 MG IF NEEDED MAKE A TOTAL OF 40 MG FOR INCREASED SWELLING ; LET us KNOW IF YOU ARE TAKING THE EXTRA AMOUNT MORE THAN 2 TIMES WEEKLY [phone]  LAB TODAY BMET, BNP  REPEAT BMET IN 1 WEEK  YOU HAVE BEEN GIVEN AN ORDER FOR COMPRESSION STOCKINGS WEAR DAILY FROM THE TIME YOU WAKE UP UNTIL BEDTIME  PLEASE FOLLOW UP WITH DR. Clifton James 10/2013

## 2013-07-28 ENCOUNTER — Telehealth: Payer: Self-pay | Admitting: *Deleted

## 2013-07-28 NOTE — Telephone Encounter (Signed)
Message copied by Tarri Fuller on Tue Jul 28, 2013 10:28 AM ------      Message from: [place], [place] T      Created: Mon Jul 27, 2013  4:45 PM       Potassium and kidney function ok, BNP normal.      Continue with current treatment plan.      Tereso Newcomer, PA-C        07/27/2013 4:45 PM ------

## 2013-07-28 NOTE — Telephone Encounter (Signed)
lmom labs ok, no changes to be made 

## 2013-07-29 ENCOUNTER — Telehealth: Payer: Self-pay | Admitting: *Deleted

## 2013-07-29 NOTE — Telephone Encounter (Signed)
no answer. I was calling pt to advise per Bing Neighbors. PA and Dr. Clifton James that pt will need lovenox bridging before any surgery. We will attempt tcb tomorrow and advise pt and also fax ov note surgeon

## 2013-07-30 NOTE — Telephone Encounter (Signed)
called Tony Davis  about bridging coumadin .Tony Davis sts that he cannot tolerate lovenox.Bing Neighbors. Pa-c advised Tony Davis that he cannot just stop his coumadin he would need to be hospitalized and bridged with heparin before any procedure/surgery.Tony Davis verbalizes understanding.

## 2013-08-03 ENCOUNTER — Other Ambulatory Visit (INDEPENDENT_AMBULATORY_CARE_PROVIDER_SITE_OTHER): Payer: BC Managed Care – PPO

## 2013-08-03 ENCOUNTER — Encounter: Payer: Self-pay | Admitting: Cardiovascular Disease

## 2013-08-03 DIAGNOSIS — I10 Essential (primary) hypertension: Secondary | ICD-10-CM

## 2013-08-03 LAB — BASIC METABOLIC PANEL
Calcium: 9 mg/dL (ref 8.4–10.5)
GFR: 108.06 mL/min (ref >60.00)
Sodium: 138 mEq/L (ref 135–145)

## 2013-08-04 ENCOUNTER — Encounter: Payer: Self-pay | Admitting: *Deleted

## 2013-08-07 ENCOUNTER — Telehealth: Payer: Self-pay | Admitting: *Deleted

## 2013-08-07 NOTE — Telephone Encounter (Signed)
Message copied by Tarri Fuller on Fri Aug 07, 2013  2:24 PM ------      Message from: Tarri Fuller      Created: Tue Aug 04, 2013 10:21 AM       Called wife's cell # and voice mail not set up so could not lmom. I will try again later and also send out a results note today since we need to make med changes as well as repeat bmet ------

## 2013-08-07 NOTE — Telephone Encounter (Signed)
We have attempted to reach pt about his lab results and recommendations about K+

## 2013-08-11 NOTE — Telephone Encounter (Signed)
Result letter sent out since unable to reach the pt by phone

## 2013-08-12 ENCOUNTER — Other Ambulatory Visit: Payer: Self-pay | Admitting: Nurse Practitioner

## 2013-08-12 DIAGNOSIS — E876 Hypokalemia: Secondary | ICD-10-CM

## 2013-08-13 ENCOUNTER — Other Ambulatory Visit (INDEPENDENT_AMBULATORY_CARE_PROVIDER_SITE_OTHER): Payer: BC Managed Care – PPO

## 2013-08-13 ENCOUNTER — Ambulatory Visit (INDEPENDENT_AMBULATORY_CARE_PROVIDER_SITE_OTHER): Payer: BC Managed Care – PPO | Admitting: *Deleted

## 2013-08-13 ENCOUNTER — Encounter: Payer: Self-pay | Admitting: Internal Medicine

## 2013-08-13 ENCOUNTER — Ambulatory Visit (INDEPENDENT_AMBULATORY_CARE_PROVIDER_SITE_OTHER): Payer: BC Managed Care – PPO | Admitting: Internal Medicine

## 2013-08-13 ENCOUNTER — Telehealth: Payer: Self-pay | Admitting: Internal Medicine

## 2013-08-13 VITALS — BP 116/70 | HR 78 | Ht 71.0 in | Wt 280.6 lb

## 2013-08-13 DIAGNOSIS — I82409 Acute embolism and thrombosis of unspecified deep veins of unspecified lower extremity: Secondary | ICD-10-CM

## 2013-08-13 DIAGNOSIS — Z7901 Long term (current) use of anticoagulants: Secondary | ICD-10-CM

## 2013-08-13 DIAGNOSIS — I6350 Cerebral infarction due to unspecified occlusion or stenosis of unspecified cerebral artery: Secondary | ICD-10-CM

## 2013-08-13 DIAGNOSIS — E876 Hypokalemia: Secondary | ICD-10-CM

## 2013-08-13 DIAGNOSIS — M1710 Unilateral primary osteoarthritis, unspecified knee: Secondary | ICD-10-CM

## 2013-08-13 DIAGNOSIS — G4733 Obstructive sleep apnea (adult) (pediatric): Secondary | ICD-10-CM

## 2013-08-13 DIAGNOSIS — K219 Gastro-esophageal reflux disease without esophagitis: Secondary | ICD-10-CM

## 2013-08-13 LAB — BASIC METABOLIC PANEL
Calcium: 9.5 mg/dL (ref 8.4–10.5)
GFR: 109.38 mL/min (ref >60.00)
Sodium: 139 mEq/L (ref 135–145)

## 2013-08-13 LAB — POCT INR: INR: 1.9

## 2013-08-13 MED ORDER — PANTOPRAZOLE SODIUM 40 MG PO TBEC: 40.0000 mg | DELAYED_RELEASE_TABLET | Freq: Two times a day (BID) | ORAL | Status: DC

## 2013-08-13 NOTE — Patient Instructions (Addendum)
Try raising the head of your bed frame with a brick under each of the head legs. That will shift the weight of your abdomen off your lungs, and keep the stomach acid down and out of your throat.  Script sent to try taking your acid blocker twice daily, before meals  We can continue CPAP 20/ APS

## 2013-08-13 NOTE — Telephone Encounter (Signed)
Request referral to [facility], he needs both knees replaced and is wanting to hold off as long as he can

## 2013-08-13 NOTE — Progress Notes (Signed)
03/18/13- 51 yoM former smsoker-Dr Shellia Carwin sleep study 2012 [facility]; has CPAP through [facility] but not using at this time; was told by hospital that he needed O2 at night as well and to let us know today. He falls asleep to easily when sitting quietly. Loud snoring and witnessed apneas. Chronically stuffy nose. No ENT surgery. NPSG 11/24/10- AHI 109.1/ hr, severe obstructive sleep apnea. CPAP was titrated to 14/AHI 0. Body weight was 237 pounds. He did not tolerate CPAP, feeling smothered, but continues to try to use it. He thinks he has gained 35 pounds since the study was done. Bedtime 8 to 9 PM with sleep latency 3-4 hours. Wakes 4 times for bathroom before finally up between 7 and 8 AM. Complicating medical problems include hypertension, coronary disease with cardiac stents, history of stroke. Tobacco abuse averaging one pack per day until quitting 03/10/2012. He was a school custodian, no longer working. Lives with his wife.  05/07/13-  51 yoM former smoker-Dr Shellia Carwin sleep study 2012 [facility]rology; has CPAP through [facility] but not using at this time; was told by hospital that he needed O2 at night as well. Hx CAD/ stents, CVA FOLLOWS FOR: would like to change DME from [facility]-will need to discuss wit PCC's. Hopsital stay for heart since last visit. Not wearing CPAP-needs mask. Applying disability. CPAP titration 04/14/13 to 23/ AHI 0. Wore Resmed AirFit F10 medium. 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 See comments  above. The patient may benefit from a formal CT paranasal sinuses  and/or ENT consultation.  ENT consult 04/01/13-Wolicki- large turbinates.  Original Report Authenticated By: Davonna Belling, M.D. CXR 04/22/13 IMPRESSION: No focal consolidation or definite radiographic  evidence of acute cardiopulmonary process.  Original Report  Authenticated By: Jearld Lesch, M.D. PFT --03/23/2013-normal spirometry flows with insignificant response to bronchodilator, slight restriction of total lung capacity, normal diffusion capacity.FVC 3.95/90%, FEV1 3.2 weight/93%, FEV1/FVC 83/103%, FEF 25-75% 3.42/98%. RV 87%, TLC 79%, DLCO 89%.  06/26/13- 51 yoM former smoker-Dr Shellia Carwin sleep study 2012 [facility]rology; has CPAP through [facility] but not using at this time; was told by hospital that he needed O2 at night as well. Hx CAD/ stents, CVA 6 wk follow up.  Wearing cpap 20/ APS DME every night.  Breathing through mouth with mask on.  Wakes up with very dry mouth.using a fullface mask with humidifier. Download confirms good compliance and control. Complains of nasal congestion. History of known prominent turbinates.  08/13/13-  51 yoM former smoker--followed for OSA; complicated by  Hx CAD/ stents, CVA      PCP Dr Yetta Barre FOLLOWS FOR: Wears CPAP 20/ APS every night for about 6-8 hours; feels like his throat is burning at times. Has gained 43 lbs since sleep study. Declines flu shot Still wakes in the morning with bad sore throat and feeling that he has to clear his throat. No bleeding on warfarin.  ROS-see HPI Constitutional:   No-   weight loss, night sweats, fevers, chills, fatigue, lassitude. HEENT:   No-  headaches, difficulty swallowing, tooth/dental problems, +sore throat,       No-  sneezing, itching, ear ache, +nasal congestion, post nasal drip,  CV:  No-   chest pain, orthopnea, PND, swelling in lower extremities, anasarca, dizziness, palpitations Resp: +  shortness of breath with exertion or at rest.  No-   productive cough,  No non-productive cough,  No- coughing up of blood.              No-   change in color of mucus.  No- wheezing.   Skin: No-   rash or lesions. GI:  No-   heartburn, indigestion, abdominal pain, nausea, vomiting,  GU:  MS:  No-   joint pain or swelling.   Neuro-     nothing  unusual Psych:  No- change in mood or affect. No depression or anxiety.  No memory loss.  OBJ- Physical Exam General- Alert, Oriented, Affect-appropriate, Distress- none acute. Overweight Skin- rash-none, lesions- none, excoriation- none Lymphadenopathy- none Head- atraumatic            Eyes- Gross vision intact, PERRLA, conjunctivae and secretions clear            Ears- Hearing, canals-normal            Nose- turbinate hypertrophy, no-Septal dev, mucus, polyps, erosion, perforation             Throat- Mallampati III-IV , mucosa clear , drainage- none, tonsils- atrophic. Own teeth. Neck- flexible , trachea midline, no stridor , thyroid nl, carotid no bruit Chest - symmetrical excursion , unlabored           Heart/CV- RRR , no murmur , no gallop  , no rub, nl s1 s2                           - JVD- none , edema-none, stasis changes- none, varices- none           Lung- clear to P&A, wheeze- none, cough- none , dullness-none, rub- none           Chest wall-  Abd-  Br/ Gen/ Rectal- Not done, not indicated Extrem- cyanosis- none, clubbing, none, atrophy- none, strength- nl Neuro- grossly intact to observation

## 2013-08-14 ENCOUNTER — Telehealth: Payer: Self-pay | Admitting: *Deleted

## 2013-08-14 ENCOUNTER — Other Ambulatory Visit: Payer: Self-pay

## 2013-08-14 DIAGNOSIS — I10 Essential (primary) hypertension: Secondary | ICD-10-CM

## 2013-08-14 MED ORDER — CARVEDILOL 12.5 MG PO TABS: ORAL_TABLET | ORAL | Status: DC

## 2013-08-14 MED ORDER — POTASSIUM CHLORIDE CRYS ER 20 MEQ PO TBCR: 80.0000 meq | EXTENDED_RELEASE_TABLET | Freq: Two times a day (BID) | ORAL | Status: DC

## 2013-08-14 MED ORDER — PANTOPRAZOLE SODIUM 40 MG PO TBEC: 40.0000 mg | DELAYED_RELEASE_TABLET | Freq: Two times a day (BID) | ORAL | Status: DC

## 2013-08-14 NOTE — Telephone Encounter (Signed)
pt notified about lab results and to increase K+ to 80 meq bid; rx sent in with new sig. Bmet 08/21/13. Pt verbalized understanding to to Plan of Care

## 2013-08-18 ENCOUNTER — Other Ambulatory Visit (INDEPENDENT_AMBULATORY_CARE_PROVIDER_SITE_OTHER): Payer: BC Managed Care – PPO

## 2013-08-18 ENCOUNTER — Encounter: Payer: Self-pay | Admitting: Physician Assistant

## 2013-08-18 ENCOUNTER — Ambulatory Visit (INDEPENDENT_AMBULATORY_CARE_PROVIDER_SITE_OTHER): Payer: BC Managed Care – PPO

## 2013-08-18 ENCOUNTER — Telehealth: Payer: Self-pay | Admitting: *Deleted

## 2013-08-18 ENCOUNTER — Encounter (INDEPENDENT_AMBULATORY_CARE_PROVIDER_SITE_OTHER): Payer: BC Managed Care – PPO

## 2013-08-18 ENCOUNTER — Telehealth: Payer: Self-pay | Admitting: Cardiovascular Disease

## 2013-08-18 ENCOUNTER — Encounter: Payer: Self-pay | Admitting: Nurse Practitioner

## 2013-08-18 ENCOUNTER — Encounter: Payer: Self-pay | Admitting: Cardiovascular Disease

## 2013-08-18 VITALS — BP 120/98 | HR 70 | Ht 71.0 in | Wt 279.0 lb

## 2013-08-18 DIAGNOSIS — M79609 Pain in unspecified limb: Secondary | ICD-10-CM

## 2013-08-18 DIAGNOSIS — M79604 Pain in right leg: Secondary | ICD-10-CM

## 2013-08-18 DIAGNOSIS — I10 Essential (primary) hypertension: Secondary | ICD-10-CM

## 2013-08-18 DIAGNOSIS — M7989 Other specified soft tissue disorders: Secondary | ICD-10-CM

## 2013-08-18 DIAGNOSIS — E876 Hypokalemia: Secondary | ICD-10-CM

## 2013-08-18 LAB — BASIC METABOLIC PANEL
BUN: 15 mg/dL (ref 6–23)
CO2: 30 mEq/L (ref 19–32)
Calcium: 9.3 mg/dL (ref 8.4–10.5)
Creatinine, Ser: 1.1 mg/dL (ref 0.4–1.5)
GFR: 96.26 mL/min (ref >60.00)
Glucose, Bld: 102 mg/dL — ABNORMAL HIGH (ref 70–99)

## 2013-08-18 NOTE — Progress Notes (Signed)
Patient came to office complaining of severe throbbing pain rt lower leg for 2 days.Stated he was woke up yesterday 08/17/13 with throbbing pain rt calf radiating up back of rt thigh. Rt calf warm to touch.Elyse Jarvis RN spoke to [employer] he advised to have venous doppler of rt leg.Tereso Newcomer PA had previously ordered bmet to be done today.

## 2013-08-18 NOTE — Telephone Encounter (Signed)
s/w pt about right leg cramp during the night that he states woke him during the night. Pt states the left leg is just sore, but it's the right leg that keep cramping. Pt states he has appt today already for lab and RN visit.  I said good and that I was just replying to the [facility] CHART message that came to [place], [place].

## 2013-08-18 NOTE — Telephone Encounter (Signed)
New problem     C/o cramping  back of legs.  Please advise on potassium cholride.

## 2013-08-18 NOTE — Telephone Encounter (Signed)
Spoke with patient following ultrasound of right lower extremity.  Negative for DVT result reported by Susy Frizzle, [facility] technician and reported to Dr. Ladona Ridgel, DOD.  Dr. Ladona Ridgel advised that problem is probably phlebitis and for patient to elevate leg and use warm compresses periodically throughout the day and to follow up with PCP if not better in a day.  I verified that patient is afebrile (98.2 F) and that patient has not had recent s/s of infection and has no open wounds on the right lower extremity. Patient verbalized understanding of discharge instructions and I advised that we would call him with lab results.  Patient ambulatory to lobby for discharge in no acute distress.

## 2013-08-18 NOTE — Telephone Encounter (Signed)
Spoke with patient who c/o severe cramps in right lower extremity onset yesterday.  Patient states the cramps have woken him up the past 2 mornings and that the cramping does get better when he gets up and moves around but that the back of his leg is very sore.  Patient increased his K-LOR dosage to 80 mEq BID on 9/5 due to low blood potassium.  Patient states his Coumadin dosage was increased last week due to an INR of 1.9.  Patient states he has hx of DVT.  Patient denies recent travel. I advised patient that I will review with the patient's provider and call him back. I reviewed patient's complaint with Tereso Newcomer, PA-C as he was the last provider to see the patient.  He advised that we can recheck BMET and schedule patient for nurse visit to evaluate patient's leg or patient can see his PCP.  I advised patient of advice and he is en route to our office for nurse/lab visit.

## 2013-08-19 ENCOUNTER — Ambulatory Visit: Payer: BC Managed Care – PPO | Admitting: Internal Medicine

## 2013-08-19 ENCOUNTER — Telehealth: Payer: Self-pay | Admitting: *Deleted

## 2013-08-19 NOTE — Assessment & Plan Note (Signed)
Plan-explore contribution of GERD the morning sore throats by trying protonix twice daily

## 2013-08-19 NOTE — Telephone Encounter (Signed)
pt notified about lab results with verbal understanding. He asked if venous doppler results in yet, I said no but as soon as they were read I will le thim know of the results, pt said thank you

## 2013-08-19 NOTE — Assessment & Plan Note (Signed)
He couldn't continue CPAP 20. Despite humidifier, this high pressure flow may contribute to his morning sore throats.

## 2013-08-20 ENCOUNTER — Encounter: Payer: Self-pay | Admitting: Physical Medicine & Rehabilitation

## 2013-08-21 ENCOUNTER — Encounter
Payer: BC Managed Care – PPO | Attending: Physical Medicine & Rehabilitation | Admitting: Physical Medicine & Rehabilitation

## 2013-08-21 ENCOUNTER — Encounter: Payer: Self-pay | Admitting: Physical Medicine & Rehabilitation

## 2013-08-21 ENCOUNTER — Other Ambulatory Visit: Payer: BC Managed Care – PPO

## 2013-08-21 VITALS — BP 146/82 | HR 73 | Resp 14 | Ht 71.0 in | Wt 275.2 lb

## 2013-08-21 DIAGNOSIS — I6350 Cerebral infarction due to unspecified occlusion or stenosis of unspecified cerebral artery: Secondary | ICD-10-CM

## 2013-08-21 DIAGNOSIS — M1712 Unilateral primary osteoarthritis, left knee: Secondary | ICD-10-CM | POA: Insufficient documentation

## 2013-08-21 DIAGNOSIS — IMO0002 Reserved for concepts with insufficient information to code with codable children: Secondary | ICD-10-CM

## 2013-08-21 DIAGNOSIS — Z79899 Other long term (current) drug therapy: Secondary | ICD-10-CM | POA: Insufficient documentation

## 2013-08-21 DIAGNOSIS — I2510 Atherosclerotic heart disease of native coronary artery without angina pectoris: Secondary | ICD-10-CM

## 2013-08-21 DIAGNOSIS — Z8673 Personal history of transient ischemic attack (TIA), and cerebral infarction without residual deficits: Secondary | ICD-10-CM | POA: Insufficient documentation

## 2013-08-21 DIAGNOSIS — M1711 Unilateral primary osteoarthritis, right knee: Secondary | ICD-10-CM | POA: Insufficient documentation

## 2013-08-21 DIAGNOSIS — M1710 Unilateral primary osteoarthritis, unspecified knee: Secondary | ICD-10-CM | POA: Insufficient documentation

## 2013-08-21 MED ORDER — DICLOFENAC SODIUM 1 % TD GEL: 1.0000 "application " | Freq: Four times a day (QID) | TRANSDERMAL | Status: DC

## 2013-08-21 NOTE — Addendum Note (Signed)
Addended by: Sherre Poot on: 08/21/2013 10:46 AM   Modules accepted: Orders

## 2013-08-21 NOTE — Patient Instructions (Addendum)
CALL ME WITH ANY PROBLEMS OR QUESTIONS (#[phone]).  HAVE A GOOD DAY  CALL BACK ON Tuesday ABOUT YOUR URINE TEST
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
## 2013-08-24 ENCOUNTER — Encounter: Payer: Self-pay | Admitting: Physical Medicine & Rehabilitation

## 2013-08-25 ENCOUNTER — Telehealth: Payer: Self-pay

## 2013-08-25 NOTE — Telephone Encounter (Signed)
Patient is wanting to know results from UDS and requesting pain medication he said he was told that they would be back on Tuesday 08/25/13. Patient's UDS results are not back yet explained to patient that we were still waiting, and it may be this afternoon or tomorrow that we would let him know when we received them.

## 2013-08-26 ENCOUNTER — Telehealth: Payer: Self-pay

## 2013-08-26 NOTE — Telephone Encounter (Signed)
Patient called again about his urine drug screen.  Advised him that the results can take up to 1-2 weeks.  He is also concerned about voltaren gel prior authorization.  This was denied.  Will contact insurance to see what can be done.  Patient informed of all of this.

## 2013-08-27 ENCOUNTER — Ambulatory Visit (INDEPENDENT_AMBULATORY_CARE_PROVIDER_SITE_OTHER): Payer: BC Managed Care – PPO | Admitting: *Deleted

## 2013-08-27 DIAGNOSIS — I82409 Acute embolism and thrombosis of unspecified deep veins of unspecified lower extremity: Secondary | ICD-10-CM

## 2013-08-27 DIAGNOSIS — I6350 Cerebral infarction due to unspecified occlusion or stenosis of unspecified cerebral artery: Secondary | ICD-10-CM

## 2013-08-27 DIAGNOSIS — Z7901 Long term (current) use of anticoagulants: Secondary | ICD-10-CM

## 2013-09-01 ENCOUNTER — Telehealth: Payer: Self-pay

## 2013-09-01 NOTE — Telephone Encounter (Signed)
Can begin him on oxycodone 10mg  q8 prn.  I will rx in the morning. Please remind me. thanks

## 2013-09-01 NOTE — Telephone Encounter (Signed)
Patient is requesting his UDS results.  Results are consistent. Patient would like a prescription for his pain.

## 2013-09-01 NOTE — Telephone Encounter (Signed)
Patient called again regarding urine drug screen and pain medication.

## 2013-09-02 MED ORDER — OXYCODONE HCL 10 MG PO TABS: 10.0000 mg | ORAL_TABLET | Freq: Three times a day (TID) | ORAL | Status: DC

## 2013-09-02 NOTE — Telephone Encounter (Signed)
Just a reminder:: Patient has called this morning wanting to know if RX was ready. If you need me to print RX just let me know quantity thanks!

## 2013-09-02 NOTE — Telephone Encounter (Signed)
rx is completed

## 2013-09-03 ENCOUNTER — Telehealth: Payer: Self-pay

## 2013-09-03 NOTE — Telephone Encounter (Signed)
[facility] pharmacy called regarding patients oxycodone script.

## 2013-09-03 NOTE — Telephone Encounter (Signed)
Patient called regarding his oxycodone script.  He says he has had trouble getting it filled.  He was also upset that he wanted something stronger than oxycodone 10.  Advised patient he would need to discuss medication change at his next visit.  Pharmacy aware they can fill oxycodone.  [facility] told to discontinue hydrocodone.

## 2013-09-10 ENCOUNTER — Ambulatory Visit (INDEPENDENT_AMBULATORY_CARE_PROVIDER_SITE_OTHER): Payer: BC Managed Care – PPO | Admitting: *Deleted

## 2013-09-10 DIAGNOSIS — I82409 Acute embolism and thrombosis of unspecified deep veins of unspecified lower extremity: Secondary | ICD-10-CM

## 2013-09-10 DIAGNOSIS — I6350 Cerebral infarction due to unspecified occlusion or stenosis of unspecified cerebral artery: Secondary | ICD-10-CM

## 2013-09-10 DIAGNOSIS — Z7901 Long term (current) use of anticoagulants: Secondary | ICD-10-CM

## 2013-09-10 LAB — POCT INR: INR: 3.5

## 2013-09-16 ENCOUNTER — Encounter: Payer: Self-pay | Admitting: Physical Medicine & Rehabilitation

## 2013-09-16 ENCOUNTER — Encounter
Payer: BC Managed Care – PPO | Attending: Physical Medicine & Rehabilitation | Admitting: Physical Medicine & Rehabilitation

## 2013-09-16 VITALS — BP 159/85 | HR 80 | Resp 16 | Ht 71.0 in | Wt 278.0 lb

## 2013-09-16 DIAGNOSIS — IMO0002 Reserved for concepts with insufficient information to code with codable children: Secondary | ICD-10-CM

## 2013-09-16 DIAGNOSIS — M1710 Unilateral primary osteoarthritis, unspecified knee: Secondary | ICD-10-CM

## 2013-09-16 DIAGNOSIS — M1711 Unilateral primary osteoarthritis, right knee: Secondary | ICD-10-CM

## 2013-09-16 DIAGNOSIS — M1712 Unilateral primary osteoarthritis, left knee: Secondary | ICD-10-CM

## 2013-09-16 DIAGNOSIS — I200 Unstable angina: Secondary | ICD-10-CM

## 2013-09-16 DIAGNOSIS — IMO0001 Reserved for inherently not codable concepts without codable children: Secondary | ICD-10-CM

## 2013-09-16 MED ORDER — OXYCODONE HCL 10 MG PO TABS: 10.0000 mg | ORAL_TABLET | Freq: Three times a day (TID) | ORAL | Status: DC

## 2013-09-16 MED ORDER — OXYMORPHONE HCL ER 10 MG PO TB12: 10.0000 mg | ORAL_TABLET | Freq: Two times a day (BID) | ORAL | Status: DC

## 2013-09-16 NOTE — Progress Notes (Signed)
Subjective:    Patient ID: Tony Davis, male    DOB: 1961-08-07, 52 y.o.   MRN: [id number]  HPI  Tony Davis is back regarding his chronic knee pain. There was some delay in getting his oxycodone started. He was rx'ed 10mg  q8prn although he felt that 20mg  at a time worked better. He thinks that the voltaren gel is helpful when it's used with his oxy.  He is considering having knee replacements potentially in the new year and wants a second surgical opinion in this regard.   Pain Inventory Average Pain 9 Pain Right Now 7 My pain is constant  In the last 24 hours, has pain interfered with the following? General activity 10 Relation with others 8 Enjoyment of life 8 What TIME of day is your pain at its worst? constant Sleep (in general) Fair  Pain is worse with: walking, bending, sitting, inactivity, standing, unsure and some activites Pain improves with: medication Relief from Meds: 6  Mobility use a cane how many minutes can you walk? 5 ability to climb steps?  no do you drive?  no  Function not employed: date last employed .  Neuro/Psych trouble walking  Prior Studies Any changes since last visit?  no  Physicians involved in your care Any changes since last visit?  no   Family History  Problem Relation Age of Onset  . Cancer Neg Hx   . Kidney disease Neg Hx   . Diabetes Neg Hx    History   Social History  . Marital Status: Married    Spouse Name: N/A    Number of Children: N/A  . Years of Education: N/A   Social History Main Topics  . Smoking status: Former Smoker -- 1.00 packs/day for 35 years    Types: Cigarettes    Quit date: 03/10/2012  . Smokeless tobacco: Never Used  . Alcohol Use: No  . Drug Use: No  . Sexual Activity: Not Currently   Other Topics Concern  . None   Social History Narrative   Married, 5 kids; lives in [place]. 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    Past Surgical History  Procedure Laterality Date   . Knee arthroscopy Bilateral 2000's    2 on left and 3 on rt  . Tee without cardioversion  03/21/2012    Procedure: TRANSESOPHAGEAL ECHOCARDIOGRAM (TEE);  Surgeon: Pricilla Riffle, MD;  Location: [facility] ENDOSCOPY;  Service: Cardiovascular;  Laterality: N/A;  . Coronary angioplasty with stent placement  2012    "1" (04/22/2013)   Past Medical History  Diagnosis Date  . GERD (gastroesophageal reflux disease)   . HLD (hyperlipidemia)   . HTN (hypertension)   . Hematuria 8/12  . Sleep apnea     states he does not use CPAP  . PFO (patent foramen ovale)     echo 8/12: EF 55-65%, grade 2 diast dysfnx; +PFO on bubble study;  TEE 4/13: EF normal, atrial septum with suspicion for interatrial septum fenestrations without flow across and few large bubbles noted in LA.  This was not felt to require closure   . CAD (coronary artery disease)     a. s/p promus DES circumflex artery 10/13/10. b. Cath 04/2013: stable disease but possible small vessel disease -Imdur added.  Marland Kitchen DVT (deep venous thrombosis)     "right" 2013  . CVA (cerebral vascular accident) 02/2010  . TIA (transient ischemic attack)   . Arthritis     "knees" (  04/22/2013)  . Rheumatoid arthritis(714.0)    BP 159/85  Pulse 80  Resp 16  Ht 5\' 11"  (1.803 m)  Wt 278 lb (126.1 kg)  BMI 38.79 kg/m2  SpO2 94%     Review of Systems  Musculoskeletal: Positive for gait problem.  All other systems reviewed and are negative.       Objective:   Physical Exam  General: Alert and oriented x 3, No apparent distress  HEENT: Head is normocephalic, atraumatic, PERRLA, EOMI, sclera anicteric, oral mucosa pink and moist, dentition intact, ext ear canals clear,  Neck: Supple without JVD or lymphadenopathy  Heart: Reg rate and rhythm. No murmurs rubs or gallops  Chest: CTA bilaterally without wheezes, rales, or rhonchi; no distress  Abdomen: Soft, non-tender, non-distended, bowel sounds positive.  Extremities: No clubbing, cyanosis, or edema. Pulses  are 2+  Skin: Clean and intact without signs of breakdown  Neuro: Pt is cognitively appropriate with normal insight, memory, and awareness. Cranial nerves 2-12 are intact. Sensory exam is normal. Reflexes are 2+ in all 4's. Fine motor coordination is intact. No tremors. Motor function is grossly 5/5.  Musculoskeletal: Both legs remain very tender to touch. Effusions present around both knees.  He has difficulty extending either knee agst gravity. He's antalgic on either leg with gait although he is fairly stable on his feet with his cane.   Marland Kitchen  Psych: Pt's affect is appropriate. Pt is cooperative. He is quite pleasant    1. Bilateral knee pain d/t endstage OA. He is in needs of knee replacements ultimately -continue voltaren gel -added opana er 10mg  q12 for baseline pain control -may continue oxy ir 10mg  q8 prn for breakthrough pain -will adjust regimen going forward as needed. -discussed the reasoning behind the current medication changes.  2. CAD  3. Hx of CVA    I'll see him back in about a month. 15 minutes of face to face patient care time were spent during this visit. All questions were encouraged and answered.

## 2013-09-16 NOTE — Patient Instructions (Signed)
CALL ME WITH ANY PROBLEMS OR QUESTIONS (#[phone]).  HAVE A GOOD DAY  

## 2013-09-17 ENCOUNTER — Telehealth: Payer: Self-pay

## 2013-09-17 DIAGNOSIS — M1711 Unilateral primary osteoarthritis, right knee: Secondary | ICD-10-CM

## 2013-09-17 DIAGNOSIS — M1712 Unilateral primary osteoarthritis, left knee: Secondary | ICD-10-CM

## 2013-09-17 NOTE — Telephone Encounter (Signed)
I HAVE TOLD HIM I WOULD NOT INCREASE THE SHORT ACTING GIVEN THIS LONG TERM SITUATION.  HAVE HIM CHECK THE COST OF OXYCONTIN 20MG  12

## 2013-09-17 NOTE — Telephone Encounter (Signed)
I called to follow up Tony Davis about his medication issues.  In looking at his visit 09/16/13 he was 34 pills short of his oxycodone 10 ir that was filled 09/03/13.  His 08/21/13  UDS was negative for hydrocodone because he said that he had been our for 2-3 weeks, which would have made that consistent except that he filled a prescription from Dr Yetta Barre 08/14/13 # 90.  I questioned him about him being out or 2-3 weeks prior to the 08/21/13 UDS if he filed a new prescription 08/14/14.  He then said that he takes a lot of medicine and was getting rid of some of it and saying that he took some to a place in [place] that disposes of medication .  I questioned why he would get a prescription filled and then take it to be destroyed , then coming to the office saying he had been with out it for weeks and wanting something for pain.  I asked if they had given him anything documenting the destruction of the hydrocodone, which he did not receive anything.  I cautioned him about taking more pills per day than was prescribed because he was 34 pills short of his oxycodone ir  and he says he told Dr Riley Kill he had to take 2 pills at a time.  (yet he went without for a time because of the disposal issue of the hydrocodone). He was getting upset but I reminded him that these were controlled substances and they are regulated and if he is going to take them he must understand there will be questions about his consumption of these meds, and that he is putting himself at risk when he disposes of them without getting some sort of documentation that they were destroyed. I told him to ask his insurance company about the cost of the oxycontin.

## 2013-09-17 NOTE — Telephone Encounter (Signed)
Patient says the opana cost $50 and he cannot afford it.  He is taking 2 of his oxycodone and that helps he would just like to have this increased instead of taking the opana.  Please advised.

## 2013-09-18 ENCOUNTER — Other Ambulatory Visit (INDEPENDENT_AMBULATORY_CARE_PROVIDER_SITE_OTHER): Payer: BC Managed Care – PPO

## 2013-09-18 ENCOUNTER — Encounter: Payer: Self-pay | Admitting: Internal Medicine

## 2013-09-18 ENCOUNTER — Telehealth: Payer: Self-pay | Admitting: *Deleted

## 2013-09-18 ENCOUNTER — Ambulatory Visit (INDEPENDENT_AMBULATORY_CARE_PROVIDER_SITE_OTHER): Payer: BC Managed Care – PPO | Admitting: Internal Medicine

## 2013-09-18 VITALS — BP 134/92 | HR 75 | Temp 97.6°F | Resp 16 | Wt 277.0 lb

## 2013-09-18 DIAGNOSIS — R109 Unspecified abdominal pain: Secondary | ICD-10-CM

## 2013-09-18 DIAGNOSIS — R197 Diarrhea, unspecified: Secondary | ICD-10-CM

## 2013-09-18 LAB — CBC
Hemoglobin: 12.6 g/dL — ABNORMAL LOW (ref 13.0–17.0)
Platelets: 289 10*3/uL (ref 150.0–400.0)
RDW: 13.2 % (ref 11.5–14.6)
WBC: 7.3 10*3/uL (ref 4.5–10.5)

## 2013-09-18 LAB — LIPASE: Lipase: 28 U/L (ref 11.0–59.0)

## 2013-09-18 LAB — COMPREHENSIVE METABOLIC PANEL
ALT: 21 U/L (ref 0–53)
CO2: 28 mEq/L (ref 19–32)
Calcium: 9.3 mg/dL (ref 8.4–10.5)
Chloride: 107 mEq/L (ref 96–112)
Creatinine, Ser: 1.1 mg/dL (ref 0.4–1.5)
GFR: 92.16 mL/min (ref >60.00)
Sodium: 143 mEq/L (ref 135–145)
Total Protein: 7.5 g/dL (ref 6.0–8.3)

## 2013-09-18 LAB — AMYLASE: Amylase: 70 U/L (ref 27–131)

## 2013-09-18 MED ORDER — DIPHENOXYLATE-ATROPINE 2.5-0.025 MG PO TABS: 1.0000 | ORAL_TABLET | Freq: Four times a day (QID) | ORAL | Status: DC | PRN

## 2013-09-18 NOTE — Patient Instructions (Signed)
Diet for Diarrhea, Adult  Frequent, runny stools (diarrhea) may be caused or worsened by food or drink. Diarrhea may be relieved by changing your diet. Since diarrhea can last up to 7 days, it is easy for you to lose too much fluid from the body and become dehydrated. Fluids that are lost need to be replaced. Along with a modified diet, make sure you drink enough fluids to keep your urine clear or pale yellow.  DIET INSTRUCTIONS  · Ensure adequate fluid intake (hydration): have 1 cup (8 oz) of fluid for each diarrhea episode. Avoid fluids that contain simple sugars or sports drinks, fruit juices, whole milk products, and sodas. Your urine should be clear or pale yellow if you are drinking enough fluids. Hydrate with an oral rehydration solution that you can purchase at pharmacies, retail stores, and online. You can prepare an oral rehydration solution at home by mixing the following ingredients together:  ·   tsp table salt.  · ¾ tsp baking soda.  ·  tsp salt substitute containing potassium chloride.  · 1  tablespoons sugar.  · 1 L (34 oz) of water.  · Certain foods and beverages may increase the speed at which food moves through the gastrointestinal (GI) tract. These foods and beverages should be avoided and include:  · Caffeinated and alcoholic beverages.  · High-fiber foods, such as raw fruits and vegetables, nuts, seeds, and whole grain breads and cereals.  · Foods and beverages sweetened with sugar alcohols, such as xylitol, sorbitol, and mannitol.  · Some foods may be well tolerated and may help thicken stool including:  · Starchy foods, such as rice, toast, pasta, low-sugar cereal, oatmeal, grits, baked potatoes, crackers, and bagels.    · Bananas.    · Applesauce.  · Add probiotic-rich foods to help increase healthy bacteria in the GI tract, such as yogurt and fermented milk products.  RECOMMENDED FOODS AND BEVERAGES  Starches  Choose foods with less than 2 g of fiber per serving.  · Recommended:  White,  French, and pita breads, plain rolls, buns, bagels. Plain muffins, matzo. Soda, saltine, or graham crackers. Pretzels, melba toast, zwieback. Cooked cereals made with water: cornmeal, farina, cream cereals. Dry cereals: refined corn, wheat, rice. Potatoes prepared any way without skins, refined macaroni, spaghetti, noodles, refined rice.  · Avoid:  Bread, rolls, or crackers made with whole wheat, multi-grains, rye, bran seeds, nuts, or coconut. Corn tortillas or taco shells. Cereals containing whole grains, multi-grains, bran, coconut, nuts, raisins. Cooked or dry oatmeal. Coarse wheat cereals, granola. Cereals advertised as "high-fiber." Potato skins. Whole grain pasta, wild or brown rice. Popcorn. Sweet potatoes, yams. Sweet rolls, doughnuts, waffles, pancakes, sweet breads.  Vegetables  · Recommended: Strained tomato and vegetable juices. Most well-cooked and canned vegetables without seeds. Fresh: Tender lettuce, cucumber without the skin, cabbage, spinach, bean sprouts.  · Avoid: Fresh, cooked, or canned: Artichokes, baked beans, beet greens, broccoli, Brussels sprouts, corn, kale, legumes, peas, sweet potatoes. Cooked: Green or red cabbage, spinach. Avoid large servings of any vegetables because vegetables shrink when cooked, and they contain more fiber per serving than fresh vegetables.  Fruit  · Recommended: Cooked or canned: Apricots, applesauce, cantaloupe, cherries, fruit cocktail, grapefruit, grapes, kiwi, mandarin oranges, peaches, pears, plums, watermelon. Fresh: Apples without skin, ripe banana, grapes, cantaloupe, cherries, grapefruit, peaches, oranges, plums. Keep servings limited to ½ cup or 1 piece.  · Avoid: Fresh: Apples with skin, apricots, mangoes, pears, raspberries, strawberries. Prune juice, stewed or dried prunes. Dried   fruits, raisins, dates. Large servings of all fresh fruits.  Protein  · Recommended: Ground or well-cooked tender beef, ham, veal, lamb, pork, or poultry. Eggs. Fish,  oysters, shrimp, lobster, other seafoods. Liver, organ meats.  · Avoid: Tough, fibrous meats with gristle. Peanut butter, smooth or chunky. Cheese, nuts, seeds, legumes, dried peas, beans, lentils.  Dairy  · Recommended: Yogurt, lactose-free milk, kefir, drinkable yogurt, buttermilk, soy milk, or plain hard cheese.  · Avoid: Milk, chocolate milk, beverages made with milk, such as milkshakes.  Soups  · Recommended: Bouillon, broth, or soups made from allowed foods. Any strained soup.  · Avoid: Soups made from vegetables that are not allowed, cream or milk-based soups.  Desserts and Sweets  · Recommended: Sugar-free gelatin, sugar-free frozen ice pops made without sugar alcohol.  · Avoid: Plain cakes and cookies, pie made with fruit, pudding, custard, cream pie. Gelatin, fruit, ice, sherbet, frozen ice pops. Ice cream, ice milk without nuts. Plain hard candy, honey, jelly, molasses, syrup, sugar, chocolate syrup, gumdrops, marshmallows.  Fats and Oils  · Recommended: Limit fats to less than 8 tsp per day.  · Avoid: Seeds, nuts, olives, avocados. Margarine, butter, cream, mayonnaise, salad oils, plain salad dressings. Plain gravy, crisp bacon without rind.  Beverages  · Recommended: Water, decaffeinated teas, oral rehydration solutions, sugar-free beverages not sweetened with sugar alcohols.  · Avoid: Fruit juices, caffeinated beverages (coffee, tea, soda), alcohol, sports drinks, or lemon-lime soda.  Condiments  · Recommended: Ketchup, mustard, horseradish, vinegar, cocoa powder. Spices in moderation: allspice, basil, bay leaves, celery powder or leaves, cinnamon, cumin powder, curry powder, ginger, mace, marjoram, onion or garlic powder, oregano, paprika, parsley flakes, ground pepper, rosemary, sage, savory, tarragon, thyme, turmeric.  · Avoid: Coconut, honey.  Document Released: 02/16/2004 Document Revised: 08/20/2012 Document Reviewed: 04/11/2012  Exit[employer]® Patient Information ©2014 [employer].

## 2013-09-18 NOTE — Telephone Encounter (Signed)
[facility] Triage Call Report Triage Record Num: [id number] Operator: Claudie Leach Patient Name: Tony Davis Call Date & Time: 09/17/2013 9:56:49PM Patient Phone: [phone] PCP: [name] Patient Gender: Male PCP Fax : Patient DOB: Jan 17, 1961 Practice Name: [facility] Reason for Call: Caller: Jourdain/Patient; PCP: [name] (Adults only); CB#: [phone]; Call regarding diarrhea -. 2nd call for nurse; Started on 09/13/13. No vomiting or fever. States he has cramping before he has the bm. Ranks the cramping as a 10 on the pain scale. States it wakes him up at night. Triaged per diarrhea or other change in bowel habits guideline. To see in ED immediately due to severe pain/cramping in abdomen or rectum that interferes with ability to carry out normal activities or sleep. Care advice given. States he will go to [facility]. Protocol(s) Used: Diarrhea or Other Change in Bowel Habits Recommended Outcome per Protocol: See ED Immediately Reason for Outcome: Severe pain/cramping in abdomen or rectum that interferes with ability to carry out normal activities or sleep Care Advice: ~ Allow the patient to be in a position of comfort. ~ Another adult should drive. Call EMS 911 if signs and symptoms of shock develop (such as unable to stand due to faintness, dizziness, or lightheadedness; new onset of confusion; slow to respond or difficult to awaken; skin is pale, gray, cool, or moist to touch; severe weakness; loss of consciousness). ~ ~ IMMEDIATE ACTION Write down provider's name. List or place the following in a bag for transport with the patient: current prescription and/or nonprescription medications; alternative treatments, therapies and medications; and street drugs. ~ Take sips of clear liquids (such as water, clear fruit juices without pulp, soda, tea or coffee without dairy or non-dairy creamer, clear broth or bouillon, oral hydration solution, or plain gelatin, fruit  ices/popsicles, hard candy) as tolerated. Sucking on ice chips is another option. ~ 09/17/2013 10:18:06PM Page 1 of 1 [email or web address]

## 2013-09-18 NOTE — Progress Notes (Signed)
Subjective:    Patient ID: Tony Davis, male    DOB: 1961-01-20, 52 y.o.   MRN: [id number]  HPI  Pt presents to the clinic today with c/o abdominal pain and diarrhea. This started 5 days ago. He denies fever, chills, nausea or vomiting. He has not taken anything OTC. He reports 3-4 loose stools per day but denies blood in the stool. He has not started or stopped any medications. He has not taken antibiotics or travel in the last month. He has not had a change in his diet.  Review of Systems      Past Medical History  Diagnosis Date  . GERD (gastroesophageal reflux disease)   . HLD (hyperlipidemia)   . HTN (hypertension)   . Hematuria 8/12  . Sleep apnea     states he does not use CPAP  . PFO (patent foramen ovale)     echo 8/12: EF 55-65%, grade 2 diast dysfnx; +PFO on bubble study;  TEE 4/13: EF normal, atrial septum with suspicion for interatrial septum fenestrations without flow across and few large bubbles noted in LA.  This was not felt to require closure   . CAD (coronary artery disease)     a. s/p promus DES circumflex artery 10/13/10. b. Cath 04/2013: stable disease but possible small vessel disease -Imdur added.  Marland Kitchen DVT (deep venous thrombosis)     "right" 2013  . CVA (cerebral vascular accident) 02/2010  . TIA (transient ischemic attack)   . Arthritis     "knees" (04/22/2013)  . Rheumatoid arthritis(714.0)     Current Outpatient Prescriptions  Medication Sig Dispense Refill  . aspirin EC 81 MG EC tablet Take 1 tablet (81 mg total) by mouth daily.      Marland Kitchen atorvastatin (LIPITOR) 80 MG tablet Take 1 tablet (80 mg total) by mouth at bedtime.  90 tablet  1  . carvedilol (COREG) 12.5 MG tablet TAKE 1 TABLET TWICE A DAY  60 tablet  3  . diclofenac sodium (VOLTAREN) 1 % GEL Apply 1 application topically 4 (four) times daily.  3 Tube  4  . folic acid (FOLVITE) 1 MG tablet Take 1 tablet (1 mg total) by mouth daily.  90 tablet  3  . furosemide (LASIX) 20 MG tablet Take 1 tablet  (20 mg total) by mouth daily. can take extra 20 mg for extra swelling only as needed  45 tablet  11  . isosorbide mononitrate (IMDUR) 30 MG 24 hr tablet Take 1 tablet (30 mg total) by mouth at bedtime.  30 tablet  6  . losartan (COZAAR) 100 MG tablet TAKE 1 TABLET BY MOUTH DAILY  30 tablet  5  . mometasone (NASONEX) 50 MCG/ACT nasal spray 1-2 puffs in each nostril once daily at bedtime.  7.5 g  0  . nitroGLYCERIN (NITROSTAT) 0.4 MG SL tablet Place 1 tablet (0.4 mg total) under the tongue every 5 (five) minutes as needed for chest pain (up to 3 doses).  25 tablet  3  . Oxycodone HCl 10 MG TABS Take 1 tablet (10 mg total) by mouth 3 (three) times daily.  90 tablet  0  . oxymorphone (OPANA ER) 10 MG 12 hr tablet Take 1 tablet (10 mg total) by mouth every 12 (twelve) hours.  60 tablet  0  . pantoprazole (PROTONIX) 40 MG tablet Take 1 tablet (40 mg total) by mouth 2 (two) times daily before a meal.  60 tablet  5  . potassium chloride SA (KLOR-CON  M20) 20 MEQ tablet Take 4 tablets (80 mEq total) by mouth 2 (two) times daily.  224 tablet  11  . warfarin (COUMADIN) 7.5 MG tablet TAKE AS DIRECTED BY COUMADIN CLINIC .  40 tablet  3   No current facility-administered medications for this visit.    Allergies  Allergen Reactions  . Iodine     REACTION: anaphylaxis  . Amlodipine     Foot edema    Family History  Problem Relation Age of Onset  . Cancer Neg Hx   . Kidney disease Neg Hx   . Diabetes Neg Hx     History   Social History  . Marital Status: Married    Spouse Name: N/A    Number of Children: N/A  . Years of Education: N/A   Occupational History  . Not on file.   Social History Main Topics  . Smoking status: Former Smoker -- 1.00 packs/day for 35 years    Types: Cigarettes    Quit date: 03/10/2012  . Smokeless tobacco: Never Used  . Alcohol Use: No  . Drug Use: No  . Sexual Activity: Not Currently   Other Topics Concern  . Not on file   Social History Narrative    Married, 5 kids; lives in [place]. 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      Constitutional: Denies fever, malaise, fatigue, headache or abrupt weight changes.  Respiratory: Denies difficulty breathing, shortness of breath, cough or sputum production.   Cardiovascular: Denies chest pain, chest tightness, palpitations or swelling in the hands or feet.  Gastrointestinal: Pt reports diarrhea. Denies abdominal pain, bloating, constipation, or blood in the stool.   No other specific complaints in a complete review of systems (except as listed in HPI above).   Objective:   Physical Exam   BP 134/92  Pulse 75  Temp(Src) 97.6 F (36.4 C) (Oral)  Resp 16  Wt 277 lb (125.646 kg)  BMI 38.65 kg/m2  SpO2 97% Wt Readings from Last 3 Encounters:  09/18/13 277 lb (125.646 kg)  09/16/13 278 lb (126.1 kg)  08/21/13 275 lb 3.2 oz (124.83 kg)    General: Appears his stated age, obese but well developed, well nourished in NAD. Cardiovascular: Normal rate and rhythm. S1,S2 noted.  No murmur, rubs or gallops noted. No JVD or BLE edema. No carotid bruits noted. Pulmonary/Chest: Normal effort and positive vesicular breath sounds. No respiratory distress. No wheezes, rales or ronchi noted.  Abdomen: Soft and generally tender. Hypoactive bowel sounds, no bruits noted. No distention or masses noted. Liver, spleen and kidneys non palpable.   BMET    Component Value Date/Time   NA 140 08/18/2013 1248   K 4.1 08/18/2013 1248   CL 105 08/18/2013 1248   CO2 30 08/18/2013 1248   GLUCOSE 102* 08/18/2013 1248   BUN 15 08/18/2013 1248   CREATININE 1.1 08/18/2013 1248   CALCIUM 9.3 08/18/2013 1248   GFRNONAA >90 04/24/2013 0540   GFRAA >90 04/24/2013 0540    Lipid Panel     Component Value Date/Time   CHOL 240* 04/23/2013 0525   TRIG 170* 04/23/2013 0525   HDL 27* 04/23/2013 0525   CHOLHDL 8.9 04/23/2013 0525   VLDL 34 04/23/2013 0525   LDLCALC 179* 04/23/2013 0525    CBC    Component Value  Date/Time   WBC 8.1 04/23/2013 0525   RBC 4.55 04/23/2013 0525   HGB 13.2 04/23/2013 0525   HCT 40.4  04/23/2013 0525   PLT 307 04/23/2013 0525   MCV 88.8 04/23/2013 0525   MCH 29.0 04/23/2013 0525   MCHC 32.7 04/23/2013 0525   RDW 13.0 04/23/2013 0525   LYMPHSABS 2.5 04/22/2013 1005   MONOABS 0.5 04/22/2013 1005   EOSABS 0.4 04/22/2013 1005   BASOSABS 0.0 04/22/2013 1005    Hgb A1C Lab Results  Component Value Date   HGBA1C 6.2* 04/22/2013        Assessment & Plan:   Diarrhea of unknown origin:  Low likely hood for cdiff or parasite Will give RX for lomotil CBC, CMET, amylase and lipase  If worse, RTC for Saturday clinic or go to nearest ER- may need CT scan

## 2013-09-21 ENCOUNTER — Telehealth: Payer: Medicare Other | Admitting: *Deleted

## 2013-09-21 DIAGNOSIS — G894 Chronic pain syndrome: Secondary | ICD-10-CM

## 2013-09-21 DIAGNOSIS — Z79899 Other long term (current) drug therapy: Secondary | ICD-10-CM

## 2013-09-21 DIAGNOSIS — Z5181 Encounter for therapeutic drug level monitoring: Secondary | ICD-10-CM

## 2013-09-21 MED ORDER — OXYCODONE HCL ER 20 MG PO T12A: 20.0000 mg | EXTENDED_RELEASE_TABLET | Freq: Two times a day (BID) | ORAL | Status: DC

## 2013-09-21 NOTE — Telephone Encounter (Signed)
Done    See rx

## 2013-09-21 NOTE — Telephone Encounter (Signed)
Patient called and said he would be ok with oxycontin.

## 2013-09-22 ENCOUNTER — Telehealth: Payer: Self-pay | Admitting: Physical Medicine & Rehabilitation

## 2013-09-22 NOTE — Telephone Encounter (Signed)
Called to pick up rx for oxycontin.  Pharmacy notified to destroy old rx for opana 09/21/13.  UDS collected

## 2013-09-22 NOTE — Telephone Encounter (Signed)
Patient was in the office this morning to pick up oxy script and give urine drug screen.  He was unhappy we requested the urine. He wanted to talk to Dr Riley Kill regarding this.  Offered patient multiple appointments.  Advised him he could leave and come back to the office to give urine sample by 12 noon today.  Patient agreed.  He also requested a list of other clinics in the area.  This was given to him.  I asked him if that ment he was not coming back and he said "no" he would be back.

## 2013-09-22 NOTE — Telephone Encounter (Signed)
Patient was in the office this morning to get a script and we had to get a urine drug screen.  Patient refused to give sample right now, patient got aggravated, staff was taking him into hallway and he called Lowella Bandy a "crazy bitch" as they were taking him back.  There were 2 other patient's in the lobby as he was doing this, and I explained to him not to use that language in this office.

## 2013-09-24 ENCOUNTER — Inpatient Hospital Stay (HOSPITAL_COMMUNITY)
Admission: AD | Admit: 2013-09-24 | Discharge: 2013-09-27 | DRG: 603 | Disposition: A | Discharge status: Home / Self Care | Payer: Medicare Other | Source: Ambulatory Visit | Attending: Internal Medicine | Admitting: Internal Medicine

## 2013-09-24 ENCOUNTER — Encounter: Payer: Self-pay | Admitting: Internal Medicine

## 2013-09-24 ENCOUNTER — Ambulatory Visit (INDEPENDENT_AMBULATORY_CARE_PROVIDER_SITE_OTHER): Payer: BC Managed Care – PPO | Admitting: General Practice

## 2013-09-24 ENCOUNTER — Ambulatory Visit (INDEPENDENT_AMBULATORY_CARE_PROVIDER_SITE_OTHER): Payer: BC Managed Care – PPO | Admitting: Internal Medicine

## 2013-09-24 ENCOUNTER — Emergency Department (HOSPITAL_COMMUNITY)
Admission: EM | Admit: 2013-09-24 | Discharge: 2013-09-24 | Discharge status: Against Medical Advice | Payer: BC Managed Care – PPO

## 2013-09-24 VITALS — BP 156/100 | HR 81 | Temp 98.4°F | Wt 275.0 lb

## 2013-09-24 DIAGNOSIS — L738 Other specified follicular disorders: Secondary | ICD-10-CM | POA: Diagnosis present

## 2013-09-24 DIAGNOSIS — IMO0001 Reserved for inherently not codable concepts without codable children: Secondary | ICD-10-CM

## 2013-09-24 DIAGNOSIS — Z86718 Personal history of other venous thrombosis and embolism: Secondary | ICD-10-CM

## 2013-09-24 DIAGNOSIS — Q2111 Secundum atrial septal defect: Secondary | ICD-10-CM

## 2013-09-24 DIAGNOSIS — L0291 Cutaneous abscess, unspecified: Secondary | ICD-10-CM

## 2013-09-24 DIAGNOSIS — H519 Unspecified disorder of binocular movement: Secondary | ICD-10-CM | POA: Diagnosis present

## 2013-09-24 DIAGNOSIS — L0390 Cellulitis, unspecified: Secondary | ICD-10-CM | POA: Insufficient documentation

## 2013-09-24 DIAGNOSIS — I82409 Acute embolism and thrombosis of unspecified deep veins of unspecified lower extremity: Secondary | ICD-10-CM | POA: Diagnosis not present

## 2013-09-24 DIAGNOSIS — Z9861 Coronary angioplasty status: Secondary | ICD-10-CM

## 2013-09-24 DIAGNOSIS — E785 Hyperlipidemia, unspecified: Secondary | ICD-10-CM | POA: Diagnosis present

## 2013-09-24 DIAGNOSIS — L0201 Cutaneous abscess of face: Principal | ICD-10-CM | POA: Diagnosis present

## 2013-09-24 DIAGNOSIS — Z7901 Long term (current) use of anticoagulants: Secondary | ICD-10-CM

## 2013-09-24 DIAGNOSIS — M1710 Unilateral primary osteoarthritis, unspecified knee: Secondary | ICD-10-CM

## 2013-09-24 DIAGNOSIS — L03211 Cellulitis of face: Principal | ICD-10-CM | POA: Diagnosis present

## 2013-09-24 DIAGNOSIS — G8929 Other chronic pain: Secondary | ICD-10-CM | POA: Diagnosis present

## 2013-09-24 DIAGNOSIS — I6350 Cerebral infarction due to unspecified occlusion or stenosis of unspecified cerebral artery: Secondary | ICD-10-CM | POA: Diagnosis not present

## 2013-09-24 DIAGNOSIS — G4730 Sleep apnea, unspecified: Secondary | ICD-10-CM | POA: Diagnosis present

## 2013-09-24 DIAGNOSIS — Z8673 Personal history of transient ischemic attack (TIA), and cerebral infarction without residual deficits: Secondary | ICD-10-CM

## 2013-09-24 DIAGNOSIS — K219 Gastro-esophageal reflux disease without esophagitis: Secondary | ICD-10-CM | POA: Diagnosis present

## 2013-09-24 DIAGNOSIS — E669 Obesity, unspecified: Secondary | ICD-10-CM | POA: Diagnosis present

## 2013-09-24 DIAGNOSIS — E119 Type 2 diabetes mellitus without complications: Secondary | ICD-10-CM | POA: Diagnosis present

## 2013-09-24 DIAGNOSIS — Z87891 Personal history of nicotine dependence: Secondary | ICD-10-CM

## 2013-09-24 DIAGNOSIS — Z79899 Other long term (current) drug therapy: Secondary | ICD-10-CM

## 2013-09-24 DIAGNOSIS — Z6838 Body mass index (BMI) 38.0-38.9, adult: Secondary | ICD-10-CM

## 2013-09-24 DIAGNOSIS — Z7982 Long term (current) use of aspirin: Secondary | ICD-10-CM

## 2013-09-24 DIAGNOSIS — L678 Other hair color and hair shaft abnormalities: Secondary | ICD-10-CM | POA: Diagnosis present

## 2013-09-24 DIAGNOSIS — I2510 Atherosclerotic heart disease of native coronary artery without angina pectoris: Secondary | ICD-10-CM | POA: Diagnosis present

## 2013-09-24 DIAGNOSIS — IMO0002 Reserved for concepts with insufficient information to code with codable children: Secondary | ICD-10-CM | POA: Diagnosis not present

## 2013-09-24 DIAGNOSIS — R51 Headache: Secondary | ICD-10-CM | POA: Diagnosis present

## 2013-09-24 DIAGNOSIS — M069 Rheumatoid arthritis, unspecified: Secondary | ICD-10-CM | POA: Diagnosis present

## 2013-09-24 DIAGNOSIS — I10 Essential (primary) hypertension: Secondary | ICD-10-CM | POA: Diagnosis present

## 2013-09-24 DIAGNOSIS — Q211 Atrial septal defect: Secondary | ICD-10-CM | POA: Diagnosis not present

## 2013-09-24 HISTORY — DX: Type 2 diabetes mellitus without complications: E11.9

## 2013-09-24 HISTORY — DX: Obstructive sleep apnea (adult) (pediatric): Z99.89

## 2013-09-24 HISTORY — DX: Obstructive sleep apnea (adult) (pediatric): G47.33

## 2013-09-24 LAB — BASIC METABOLIC PANEL
CO2: 28 mEq/L (ref 19–32)
Calcium: 9.2 mg/dL (ref 8.4–10.5)
Chloride: 104 mEq/L (ref 96–112)
GFR calc non Af Amer: 69 mL/min — ABNORMAL LOW (ref >90)
Glucose, Bld: 133 mg/dL — ABNORMAL HIGH (ref 70–99)
Potassium: 3.9 mEq/L (ref 3.5–5.1)
Sodium: 141 mEq/L (ref 135–145)

## 2013-09-24 LAB — POCT INR: INR: 2.3

## 2013-09-24 LAB — GLUCOSE, CAPILLARY: Glucose-Capillary: 134 mg/dL — ABNORMAL HIGH (ref 70–99)

## 2013-09-24 MED ORDER — PANTOPRAZOLE SODIUM 40 MG PO TBEC
40.0000 mg | DELAYED_RELEASE_TABLET | Freq: Two times a day (BID) | ORAL | Status: DC
  Administered 2013-09-25 – 2013-09-26 (×4): 40 mg via ORAL
  Filled 2013-09-24 (×4): qty 1

## 2013-09-24 MED ORDER — WARFARIN SODIUM 7.5 MG PO TABS
7.5000 mg | ORAL_TABLET | ORAL | Status: AC
  Administered 2013-09-25: 7.5 mg via ORAL
  Filled 2013-09-24 (×2): qty 1

## 2013-09-24 MED ORDER — SODIUM CHLORIDE 0.9 % IJ SOLN
3.0000 mL | Freq: Two times a day (BID) | INTRAMUSCULAR | Status: DC
  Administered 2013-09-24 – 2013-09-26 (×4): 3 mL via INTRAVENOUS

## 2013-09-24 MED ORDER — ASPIRIN EC 81 MG PO TBEC: 81.0000 mg | DELAYED_RELEASE_TABLET | Freq: Every day | ORAL | Status: DC

## 2013-09-24 MED ORDER — COUMADIN BOOK
1.0000 | Freq: Once | Status: AC
  Administered 2013-09-24: 1
  Filled 2013-09-24: qty 1

## 2013-09-24 MED ORDER — WARFARIN SODIUM 7.5 MG PO TABS: 7.5000 mg | ORAL_TABLET | Freq: Every day | ORAL | Status: DC

## 2013-09-24 MED ORDER — CARVEDILOL 12.5 MG PO TABS
12.5000 mg | ORAL_TABLET | Freq: Two times a day (BID) | ORAL | Status: DC
  Administered 2013-09-25 – 2013-09-26 (×4): 12.5 mg via ORAL
  Filled 2013-09-24 (×9): qty 1

## 2013-09-24 MED ORDER — SODIUM CHLORIDE 0.9 % IJ SOLN
3.0000 mL | INTRAMUSCULAR | Status: DC | PRN
  Administered 2013-09-24: 3 mL via INTRAVENOUS

## 2013-09-24 MED ORDER — OXYCODONE HCL 5 MG PO TABS: 10.0000 mg | ORAL_TABLET | Freq: Three times a day (TID) | ORAL | Status: DC | PRN

## 2013-09-24 MED ORDER — OXYCODONE HCL ER 10 MG PO T12A
20.0000 mg | EXTENDED_RELEASE_TABLET | Freq: Two times a day (BID) | ORAL | Status: DC
  Administered 2013-09-24 – 2013-09-26 (×2): 20 mg via ORAL
  Filled 2013-09-24 (×5): qty 2

## 2013-09-24 MED ORDER — OXYCODONE HCL 10 MG PO TABS: 10.0000 mg | ORAL_TABLET | Freq: Three times a day (TID) | ORAL | Status: DC

## 2013-09-24 MED ORDER — POTASSIUM CHLORIDE CRYS ER 20 MEQ PO TBCR
80.0000 meq | EXTENDED_RELEASE_TABLET | Freq: Two times a day (BID) | ORAL | Status: DC
  Administered 2013-09-25 – 2013-09-26 (×4): 80 meq via ORAL
  Filled 2013-09-24 (×6): qty 4

## 2013-09-24 MED ORDER — LOSARTAN POTASSIUM 50 MG PO TABS
100.0000 mg | ORAL_TABLET | Freq: Every day | ORAL | Status: DC
  Administered 2013-09-25 – 2013-09-26 (×2): 100 mg via ORAL
  Filled 2013-09-24 (×3): qty 2

## 2013-09-24 MED ORDER — ATORVASTATIN CALCIUM 80 MG PO TABS
80.0000 mg | ORAL_TABLET | Freq: Every day | ORAL | Status: DC
  Administered 2013-09-24 – 2013-09-26 (×3): 80 mg via ORAL
  Filled 2013-09-24 (×4): qty 1

## 2013-09-24 MED ORDER — DEXTROSE 5 % IV SOLN
1.0000 g | Freq: Two times a day (BID) | INTRAVENOUS | Status: DC
  Administered 2013-09-24: 1 g via INTRAVENOUS
  Filled 2013-09-24 (×2): qty 1

## 2013-09-24 MED ORDER — NITROGLYCERIN 0.4 MG SL SUBL: 0.4000 mg | SUBLINGUAL_TABLET | SUBLINGUAL | Status: DC | PRN

## 2013-09-24 MED ORDER — FUROSEMIDE 20 MG PO TABS
20.0000 mg | ORAL_TABLET | Freq: Every day | ORAL | Status: DC
  Administered 2013-09-25 – 2013-09-26 (×2): 20 mg via ORAL
  Filled 2013-09-24 (×3): qty 1

## 2013-09-24 MED ORDER — WARFARIN VIDEO: 1.0000 | Freq: Once | Status: DC

## 2013-09-24 MED ORDER — WARFARIN - PHYSICIAN DOSING INPATIENT
Freq: Every day | Status: DC
  Administered 2013-09-26: 18:00:00

## 2013-09-24 MED ORDER — ASPIRIN EC 81 MG PO TBEC
81.0000 mg | DELAYED_RELEASE_TABLET | Freq: Every day | ORAL | Status: DC
  Administered 2013-09-25 – 2013-09-26 (×2): 81 mg via ORAL
  Filled 2013-09-24 (×3): qty 1

## 2013-09-24 MED ORDER — FUROSEMIDE 20 MG PO TABS: 20.0000 mg | ORAL_TABLET | Freq: Every day | ORAL | Status: DC

## 2013-09-24 MED ORDER — ISOSORBIDE MONONITRATE ER 30 MG PO TB24
30.0000 mg | ORAL_TABLET | Freq: Every day | ORAL | Status: DC
  Administered 2013-09-24 – 2013-09-26 (×3): 30 mg via ORAL
  Filled 2013-09-24 (×4): qty 1

## 2013-09-24 MED ORDER — SODIUM CHLORIDE 0.9 % IV SOLN: 250.0000 mL | INTRAVENOUS | Status: DC | PRN

## 2013-09-24 MED ORDER — WARFARIN 1.25 MG HALF TABLET: 11.2500 mg | ORAL_TABLET | ORAL | Status: DC

## 2013-09-24 MED ORDER — INSULIN ASPART 100 UNIT/ML ~~LOC~~ SOLN: 0.0000 [IU] | Freq: Three times a day (TID) | SUBCUTANEOUS | Status: DC

## 2013-09-24 NOTE — Assessment & Plan Note (Signed)
Facial cellulitis with pinpoint pustular rash on erythematous base--swollen and tender to the touch, which has developed over the past 2 days. No fever or chills. He does admit to pain "behind his eyes" and there is some pain on EOM--looking to the left. Given possibility of orbital cellulitis in a patient with a many multiple comorbidities including diabetes, will admit to [facility] per patient preference to receive IV antibiotics. Given his use of warfarin, and the need for coverage against pseudomonas, a 3rd generation cephalosporin is likely the most appropriate treatment.

## 2013-09-24 NOTE — Progress Notes (Signed)
Subjective:     Patient ID: Tony Davis, male   DOB: 04/23/1961, 52 y.o.   MRN: [id number]  HPI Tony Davis is a 52 year old man who presents with an acute rash developing over his cheeks--they are small pustules over an erythematous base. There is also swelling in his cheeks, and a "knot" lateral to his left eye. No rashes anywhere else. No change in detergents or soaps. No use of lotions or soaps. Some pain "behind his eyes," a minor HA.   No recent changes in medicines or foods. He wears a CPAP at night, but didn't wear it last night because the rash is tender to the touch. No recent time in hot tubs or swimming in lakes.   With regards to his blood pressure, he takes it at home and it is typically about 120s/80s. The highest it has been in the past month is what it is today.   Review of Systems General: No fever, chills, or night sweats.  Pulm: No SOB, no chest tightness or wheezing.  CV: No chest pain.  Neuro: No HA. No changes in vision. No dizziness.     Objective:   Physical Exam Filed Vitals:   09/24/13 1553  BP: 156/100  Pulse: 81  Temp: 98.4 F (36.9 C)   General: Man sitting in NAD.  CV: RRR HEENT: TMs clear bilaterally. Nasal mucosa and oropharnyx is appropriately red--no blackness of membranes. No frontal sinus tenderness to palpation.  Derm: Scattered pinpoint pustules on erythematous bases distributed over bilateral cheeks. Some swelling, tender to touch. Lateral to his left eye is a fluctuant mass that is tender to palpation.  Neuro: EOM limited bilaterally (patient reports this is not a change from baseline), but pain on looking to the left.    Current Outpatient Prescriptions on File Prior to Visit  Medication Sig Dispense Refill  . aspirin EC 81 MG EC tablet Take 1 tablet (81 mg total) by mouth daily.      Marland Kitchen atorvastatin (LIPITOR) 80 MG tablet Take 1 tablet (80 mg total) by mouth at bedtime.  90 tablet  1  . carvedilol (COREG) 12.5 MG tablet TAKE 1 TABLET  TWICE A DAY  60 tablet  3  . diclofenac sodium (VOLTAREN) 1 % GEL Apply 1 application topically 4 (four) times daily.  3 Tube  4  . diphenoxylate-atropine (LOMOTIL) 2.5-0.025 MG per tablet Take 1 tablet by mouth 4 (four) times daily as needed for diarrhea or loose stools.  30 tablet  0  . folic acid (FOLVITE) 1 MG tablet Take 1 tablet (1 mg total) by mouth daily.  90 tablet  3  . furosemide (LASIX) 20 MG tablet Take 1 tablet (20 mg total) by mouth daily. can take extra 20 mg for extra swelling only as needed  45 tablet  11  . isosorbide mononitrate (IMDUR) 30 MG 24 hr tablet Take 1 tablet (30 mg total) by mouth at bedtime.  30 tablet  6  . losartan (COZAAR) 100 MG tablet TAKE 1 TABLET BY MOUTH DAILY  30 tablet  5  . mometasone (NASONEX) 50 MCG/ACT nasal spray 1-2 puffs in each nostril once daily at bedtime.  7.5 g  0  . nitroGLYCERIN (NITROSTAT) 0.4 MG SL tablet Place 1 tablet (0.4 mg total) under the tongue every 5 (five) minutes as needed for chest pain (up to 3 doses).  25 tablet  3  . OxyCODONE (OXYCONTIN) 20 mg T12A 12 hr tablet Take 1 tablet (20  mg total) by mouth every 12 (twelve) hours.  60 tablet  0  . Oxycodone HCl 10 MG TABS Take 1 tablet (10 mg total) by mouth 3 (three) times daily.  90 tablet  0  . pantoprazole (PROTONIX) 40 MG tablet Take 1 tablet (40 mg total) by mouth 2 (two) times daily before a meal.  60 tablet  5  . potassium chloride SA (KLOR-CON M20) 20 MEQ tablet Take 4 tablets (80 mEq total) by mouth 2 (two) times daily.  224 tablet  11  . warfarin (COUMADIN) 7.5 MG tablet TAKE AS DIRECTED BY COUMADIN CLINIC .  40 tablet  3   No current facility-administered medications on file prior to visit.       Assessment:     Tony Davis is a 52 year old man with a complicated medical history including a DM, HTN, past CVA with hemiparesis, OA, hyperlipidemia, OSA, CAD, DVT,and a history of tobacco abuse who presents for evaluation of a painful rash on his face.     Plan:     See  plan by problem list.

## 2013-09-24 NOTE — H&P (Signed)
Tony Davis is an 52 y.o. male.   Chief Complaint: facial pain HPI: Tony Davis is a 52 year old man who presents with an acute rash developing over his cheeks--they are small pustules over an erythematous base. There is also swelling in his cheeks, and a "knot" lateral to his left eye. No rashes anywhere else. No change in detergents or soaps. No use of lotions or soaps. Some pain "behind his eyes," a minor HA.  No recent changes in medicines or foods. He wears a CPAP at night, but didn't wear it last night because the rash is tender to the touch. No recent time in hot tubs or swimming in lakes.  With regards to his blood pressure, he takes it at home and it is typically about 120s/80s. The highest it has been in the past month is what it is today.    Past Medical History  Diagnosis Date  . GERD (gastroesophageal reflux disease)   . HLD (hyperlipidemia)   . HTN (hypertension)   . Hematuria 8/12  . Sleep apnea     states he does not use CPAP  . PFO (patent foramen ovale)     echo 8/12: EF 55-65%, grade 2 diast dysfnx; +PFO on bubble study;  TEE 4/13: EF normal, atrial septum with suspicion for interatrial septum fenestrations without flow across and few large bubbles noted in LA.  This was not felt to require closure   . CAD (coronary artery disease)     a. s/p promus DES circumflex artery 10/13/10. b. Cath 04/2013: stable disease but possible small vessel disease -Imdur added.  Marland Kitchen DVT (deep venous thrombosis)     "right" 2013  . CVA (cerebral vascular accident) 02/2010  . TIA (transient ischemic attack)   . Arthritis     "knees" (04/22/2013)  . Rheumatoid arthritis(714.0)     Past Surgical History  Procedure Laterality Date  . Knee arthroscopy Bilateral 2000's    2 on left and 3 on rt  . Tee without cardioversion  03/21/2012    Procedure: TRANSESOPHAGEAL ECHOCARDIOGRAM (TEE);  Surgeon: Pricilla Riffle, MD;  Location: [facility] ENDOSCOPY;  Service: Cardiovascular;  Laterality: N/A;  . Coronary  angioplasty with stent placement  2012    "1" (04/22/2013)    Family History  Problem Relation Age of Onset  . Cancer Neg Hx   . Kidney disease Neg Hx   . Diabetes Neg Hx    Social History:  reports that he quit smoking about 18 months ago. His smoking use included Cigarettes. He has a 35 pack-year smoking history. He has never used smokeless tobacco. He reports that he does not drink alcohol or use illicit drugs.  Allergies:  Allergies  Allergen Reactions  . Iodine     REACTION: anaphylaxis  . Amlodipine     Foot edema   No current facility-administered medications on file prior to encounter.   Current Outpatient Prescriptions on File Prior to Encounter  Medication Sig Dispense Refill  . aspirin EC 81 MG EC tablet Take 1 tablet (81 mg total) by mouth daily.      Marland Kitchen atorvastatin (LIPITOR) 80 MG tablet Take 1 tablet (80 mg total) by mouth at bedtime.  90 tablet  1  . carvedilol (COREG) 12.5 MG tablet TAKE 1 TABLET TWICE A DAY  60 tablet  3  . diclofenac sodium (VOLTAREN) 1 % GEL Apply 1 application topically 4 (four) times daily.  3 Tube  4  . diphenoxylate-atropine (LOMOTIL) 2.5-0.025 MG per  tablet Take 1 tablet by mouth 4 (four) times daily as needed for diarrhea or loose stools.  30 tablet  0  . folic acid (FOLVITE) 1 MG tablet Take 1 tablet (1 mg total) by mouth daily.  90 tablet  3  . furosemide (LASIX) 20 MG tablet Take 1 tablet (20 mg total) by mouth daily. can take extra 20 mg for extra swelling only as needed  45 tablet  11  . isosorbide mononitrate (IMDUR) 30 MG 24 hr tablet Take 1 tablet (30 mg total) by mouth at bedtime.  30 tablet  6  . losartan (COZAAR) 100 MG tablet TAKE 1 TABLET BY MOUTH DAILY  30 tablet  5  . mometasone (NASONEX) 50 MCG/ACT nasal spray 1-2 puffs in each nostril once daily at bedtime.  7.5 g  0  . nitroGLYCERIN (NITROSTAT) 0.4 MG SL tablet Place 1 tablet (0.4 mg total) under the tongue every 5 (five) minutes as needed for chest pain (up to 3 doses).  25  tablet  3  . OxyCODONE (OXYCONTIN) 20 mg T12A 12 hr tablet Take 1 tablet (20 mg total) by mouth every 12 (twelve) hours.  60 tablet  0  . Oxycodone HCl 10 MG TABS Take 1 tablet (10 mg total) by mouth 3 (three) times daily.  90 tablet  0  . pantoprazole (PROTONIX) 40 MG tablet Take 1 tablet (40 mg total) by mouth 2 (two) times daily before a meal.  60 tablet  5  . potassium chloride SA (KLOR-CON M20) 20 MEQ tablet Take 4 tablets (80 mEq total) by mouth 2 (two) times daily.  224 tablet  11  . warfarin (COUMADIN) 7.5 MG tablet TAKE AS DIRECTED BY COUMADIN CLINIC .  40 tablet  3     Results for orders placed in visit on 09/24/13 (from the past 48 hour(s))  POCT INR     Status: None   Collection Time    09/24/13  1:45 PM      Result Value Range   INR 2.3     No results found.  Review of Systems  Constitutional: Negative for fever, chills, weight loss and diaphoresis.  HENT: Negative for congestion, ear pain, nosebleeds and sore throat.   Eyes: Positive for pain. Negative for blurred vision, double vision and discharge.  Respiratory: Negative.   Cardiovascular: Negative for chest pain, palpitations and leg swelling.  Gastrointestinal: Negative.   Genitourinary: Negative.   Musculoskeletal: Negative.   Skin: Negative for itching and rash.  Neurological: Positive for headaches.       Ophthalmoplegia- chronic  Endo/Heme/Allergies: Negative.   Psychiatric/Behavioral: Negative.     There were no vitals taken for this visit. Physical Exam  General: Man sitting in NAD.  CV: RRR  HEENT: TMs clear bilaterally. Nasal mucosa and oropharnyx is appropriately red--no blackness of membranes. No frontal sinus tenderness to palpation.  Derm: Scattered pinpoint pustules on erythematous bases distributed over bilateral cheeks. Some swelling, tender to touch. Lateral to his left eye is a fluctuant mass that is tender to palpation.  Neuro: EOM limited bilaterally (patient reports this is not a change  from baseline), but pain on looking to the left.   Assessment/Plan 1. ID - patient presents with facial cellulitis/folliculitis with retro-orbital pain, painful nodule left zygoma in a diabetic. Concern for facial with high risk for extension.  Plan IV antibiotic - cefepime 1 g q 12 First dose now.   :Lab: CBC  Imaging: CT sinus with contrast to r/o  extension.  2. DM - on no meds at home. Last A1C June '14 6.2%  Plan Sliding scale  Low carb diet  3. Chronic pain - secondary to multiple joint disease  Plan Continue home meds  4. HTN  - mildly elevated in the office  Plan Continue home meds  Lab: Bmet  Monitor BP  5. CVD - h/o PFO and CVA on full anticoagulation  Plan Continue Warfarin  Pharmacy to consult for management  Illene Regulus 09/24/2013, 6:05 PM

## 2013-09-25 ENCOUNTER — Encounter (HOSPITAL_COMMUNITY): Payer: Self-pay | Admitting: General Practice

## 2013-09-25 DIAGNOSIS — L0291 Cutaneous abscess, unspecified: Secondary | ICD-10-CM | POA: Diagnosis not present

## 2013-09-25 LAB — CBC WITH DIFFERENTIAL/PLATELET
Basophils Absolute: 0 K/uL (ref 0.0–0.1)
Basophils Relative: 0 % (ref 0–1)
Eosinophils Absolute: 0.2 K/uL (ref 0.0–0.7)
Eosinophils Relative: 3 % (ref 0–5)
HCT: 38.2 % — ABNORMAL LOW (ref 39.0–52.0)
Hemoglobin: 13.4 g/dL (ref 13.0–17.0)
Lymphocytes Relative: 20 % (ref 12–46)
Lymphs Abs: 1.6 K/uL (ref 0.7–4.0)
MCH: 30.2 pg (ref 26.0–34.0)
MCHC: 35.1 g/dL (ref 30.0–36.0)
MCV: 86.2 fL (ref 78.0–100.0)
Monocytes Absolute: 0.4 K/uL (ref 0.1–1.0)
Monocytes Relative: 5 % (ref 3–12)
Neutro Abs: 5.4 K/uL (ref 1.7–7.7)
Neutrophils Relative %: 71 % (ref 43–77)
Platelets: 355 K/uL (ref 150–400)
RBC: 4.43 MIL/uL (ref 4.22–5.81)
RDW: 12.7 % (ref 11.5–15.5)
WBC: 7.6 K/uL (ref 4.0–10.5)

## 2013-09-25 LAB — HEMOGLOBIN A1C: Mean Plasma Glucose: 151 mg/dL — ABNORMAL HIGH (ref <117)

## 2013-09-25 LAB — GLUCOSE, CAPILLARY
Glucose-Capillary: 130 mg/dL — ABNORMAL HIGH (ref 70–99)
Glucose-Capillary: 139 mg/dL — ABNORMAL HIGH (ref 70–99)
Glucose-Capillary: 114 mg/dL — ABNORMAL HIGH (ref 70–99)
Glucose-Capillary: 105 mg/dL — ABNORMAL HIGH (ref 70–99)

## 2013-09-25 LAB — PROTIME-INR: Prothrombin Time: 21.6 seconds — ABNORMAL HIGH (ref 11.6–15.2)

## 2013-09-25 MED ORDER — VANCOMYCIN HCL IN DEXTROSE 1-5 GM/200ML-% IV SOLN
1000.0000 mg | Freq: Three times a day (TID) | INTRAVENOUS | Status: DC
  Administered 2013-09-25 – 2013-09-26 (×5): 1000 mg via INTRAVENOUS
  Filled 2013-09-25 (×7): qty 200

## 2013-09-25 MED ORDER — VANCOMYCIN HCL IN DEXTROSE 1-5 GM/200ML-% IV SOLN
1000.0000 mg | Freq: Once | INTRAVENOUS | Status: AC
  Administered 2013-09-25: 1000 mg via INTRAVENOUS
  Filled 2013-09-25: qty 200

## 2013-09-25 NOTE — Progress Notes (Signed)
ANTIBIOTIC CONSULT NOTE - INITIAL  Pharmacy Consult for Vancomycin Indication: cellulitis   Allergies  Allergen Reactions  . Iodine     REACTION: anaphylaxis  . Amlodipine     Foot edema    Patient Measurements: Height: 5\' 11"  (180.3 cm) Weight: 276 lb 1.6 oz (125.238 kg) IBW/kg (Calculated) : 75.3 Adjusted Body Weight: 90 kg   Vital Signs: Temp: 97.8 F (36.6 C) (10/17 0500) Temp src: Oral (10/16 2048) BP: 118/77 mmHg (10/17 0500) Pulse Rate: 66 (10/17 0500) Intake/Output from previous day: 10/16 0701 - 10/17 0700 In: -  Out: 400 [Urine:400] Intake/Output from this shift:    Labs:  Recent Labs  09/24/13 2123  CREATININE 1.20   Estimated Creatinine Clearance: 100.4 ml/min (by C-G formula based on Cr of 1.2). No results found for this basename: VANCOTROUGH, VANCOPEAK, VANCORANDOM, GENTTROUGH, GENTPEAK, GENTRANDOM, TOBRATROUGH, TOBRAPEAK, TOBRARND, AMIKACINPEAK, AMIKACINTROU, AMIKACIN,  in the last 72 hours   Microbiology: No results found for this or any previous visit (from the past 720 hour(s)).  Medical History: Past Medical History  Diagnosis Date  . GERD (gastroesophageal reflux disease)   . HLD (hyperlipidemia)   . HTN (hypertension)   . Hematuria 8/12  . PFO (patent foramen ovale)     echo 8/12: EF 55-65%, grade 2 diast dysfnx; +PFO on bubble study;  TEE 4/13: EF normal, atrial septum with suspicion for interatrial septum fenestrations without flow across and few large bubbles noted in LA.  This was not felt to require closure   . CAD (coronary artery disease)     a. s/p promus DES circumflex artery 10/13/10. b. Cath 04/2013: stable disease but possible small vessel disease -Imdur added.  Marland Kitchen DVT (deep venous thrombosis) 2013    "right"   . TIA (transient ischemic attack)     "1-2; both after the one in 02/2010" (09/25/2013)  . OSA on CPAP   . Type II diabetes mellitus     June 2014 AIC 6.2%/notes 09/24/2013; pt denies this hx on 09/25/2013  .  Arthritis     "knees" (09/25/2013)  . Rheumatoid arthritis(714.0)   . CVA (cerebral vascular accident) 02/2010    "lost peripheral vision when I had the stroke; some memory issues since" (09/25/2013)    Medications:  Prescriptions prior to admission  Medication Sig Dispense Refill  . aspirin EC 81 MG EC tablet Take 1 tablet (81 mg total) by mouth daily.      Marland Kitchen atorvastatin (LIPITOR) 80 MG tablet Take 1 tablet (80 mg total) by mouth at bedtime.  90 tablet  1  . carvedilol (COREG) 12.5 MG tablet TAKE 1 TABLET TWICE A DAY  60 tablet  3  . diclofenac sodium (VOLTAREN) 1 % GEL Apply 1 application topically 3 (three) times daily. To knees      . folic acid (FOLVITE) 1 MG tablet Take 1 tablet (1 mg total) by mouth daily.  90 tablet  3  . furosemide (LASIX) 20 MG tablet Take 20 mg by mouth daily.      . isosorbide mononitrate (IMDUR) 30 MG 24 hr tablet Take 1 tablet (30 mg total) by mouth at bedtime.  30 tablet  6  . losartan (COZAAR) 100 MG tablet TAKE 1 TABLET BY MOUTH DAILY  30 tablet  5  . mometasone (NASONEX) 50 MCG/ACT nasal spray Place 2 sprays into the nose daily. To each nostril      . nitroGLYCERIN (NITROSTAT) 0.4 MG SL tablet Place 1 tablet (0.4 mg total) under  the tongue every 5 (five) minutes as needed for chest pain (up to 3 doses).  25 tablet  3  . oxyCODONE (OXY IR/ROXICODONE) 5 MG immediate release tablet Take 10 mg by mouth 3 (three) times daily as needed for pain (knee pain).      . OxyCODONE (OXYCONTIN) 20 mg T12A 12 hr tablet Take 1 tablet (20 mg total) by mouth every 12 (twelve) hours.  60 tablet  0  . pantoprazole (PROTONIX) 40 MG tablet Take 1 tablet (40 mg total) by mouth 2 (two) times daily before a meal.  60 tablet  5  . potassium chloride SA (KLOR-CON M20) 20 MEQ tablet Take 4 tablets (80 mEq total) by mouth 2 (two) times daily.  224 tablet  11  . warfarin (COUMADIN) 7.5 MG tablet Take 7.5-11.25 mg by mouth daily. Takes 1 tablet daily, except 1.5 tab on Thursday        Assessment: 52 yo male with facial cellulitis for empiric antibiotics  Goal of Therapy:  Vancomycin trough level 10-15 mcg/ml  Plan:  Vancomycin 2 g IV now, then 1000 mg IV q8h  Tony Davis, Tony Davis 09/25/2013,7:47 AM

## 2013-09-25 NOTE — Progress Notes (Signed)
Mr. Biller is allergic to contrast.  MD notified and CT scan notified.  MRI was ordered instead by on call MD.  Ronnell Freshwater, RN.

## 2013-09-25 NOTE — Care Management Note (Signed)
  Page 1 of 1   09/25/2013     9:43:25 AM   CARE MANAGEMENT NOTE 09/25/2013  Patient:  Tony Davis, Tony Davis   Account Number:  [id number]  Date Initiated:  09/25/2013  Documentation initiated by:  Ronny Flurry  Subjective/Objective Assessment:     Action/Plan:   Anticipated DC Date:     Anticipated DC Plan:           Choice offered to / List presented to:             Status of service:   Medicare Important Message given?   (If response is "NO", the following Medicare IM given date fields will be blank) Date Medicare IM given:   Date Additional Medicare IM given:    Discharge Disposition:    Per UR Regulation:    If discussed at Long Length of Stay Meetings, dates discussed:    Comments:  09-25-13 Consult for : haven't worked in over 2 yrs; can't afford the copays. Printed patient's medication list and reviewed with patient . Patient confirmed he does have BCBS and he gets all his medications at [facility] for $4 except for Voltaren 1 % gel which his co pay is $50 and Oxycontin which his co pay is $30.   Provided patient with medication assistance form for Voltaren cream ( patient currently using tablet in room , therefore encouraged him to go to the website listed on form , he said he will ask his wife to look at it ) . Website for Oxycontin assistance is only for unsured .  Also , gave patient precription discount card , he will check with his pharmacy to see if it will assist with any of his precriptions.  Patient states he has applied for social assistance however, his wife's income is to high . Patient states he has a different address than his wife but they are still together and not separated.  Ronny Flurry RN BSN [phone]

## 2013-09-25 NOTE — Progress Notes (Signed)
Subjective: Mr. Hattabaugh is admitted for parenteral treatment of facial cellulitis due to extent of infection and being immunocompromised as a diabetic. Overnight - CT sinus/facial cancelled due to allergy to contrast material.  He has received 1st dose of cefepime.  Mr. Gallo reports he had a rough night. He also reports that oxycontin causes nausea and vomiting on an empty stomach; at home he takes this with meals.   Objective: Lab: No results found for this basename: WBC, NEUTROABS, HGB, HCT, MCV, PLT,  in the last 72 hours  Recent Labs  09/24/13 2123  NA 141  K 3.9  CL 104  GLUCOSE 133*  BUN 17  CREATININE 1.20  CALCIUM 9.2      Imaging:  Scheduled Meds: . aspirin EC  81 mg Oral Daily  . atorvastatin  80 mg Oral QHS  . carvedilol  12.5 mg Oral BID WC  . furosemide  20 mg Oral Daily  . insulin aspart  0-20 Units Subcutaneous TID WC  . isosorbide mononitrate  30 mg Oral QHS  . losartan  100 mg Oral Daily  . OxyCODONE  20 mg Oral Q12H  . pantoprazole  40 mg Oral BID AC  . potassium chloride SA  80 mEq Oral BID  . sodium chloride  3 mL Intravenous Q12H  . [START ON 10/01/2013] warfarin  11.25 mg Oral Q Thu-1800  . warfarin  7.5 mg Oral Custom  . warfarin  1 each Does not apply Once  . Warfarin - Physician Dosing Inpatient   Does not apply q1800   Continuous Infusions:  PRN Meds:.sodium chloride, nitroGLYCERIN, oxyCODONE, sodium chloride   Physical Exam: Filed Vitals:   09/25/13 0500  BP: 118/77  Pulse: 66  Temp: 97.8 F (36.6 C)  Resp: 19  Gen'l - heavy set AA man in no distress HEENT - [place], C&S a bit muddy, PERRLA, mild proptosis Cor- RRR Pulm - normal respirations Derm - face with erythema, mild calore, small pustules across both cheeks Neuro - alert and oriented      Assessment/Plan: 1. ID/facial cellulits - CT sinus cancelled due to contrast allergy. At this point will defer imaging unless he becomes more symptomatic. Reviewed recommendations UpToDate  and will change coverage to vancomycin for better MRSA coverage - consult in to pharmacy.  2. DM - continue SS  3. Chronic pain - followed by Dr. Hermelinda Medicus. Continue oral meds, will not convert to IV meds.  4. HTN - good control  5. CVD - fully anticoagulated.     Illene Regulus [place] IM (o) [phone]; (c) [phone] Call-grp - Patsi Sears IM  Tele: [phone]  09/25/2013, 7:40 AM

## 2013-09-26 LAB — PROTIME-INR
INR: 2.15 — ABNORMAL HIGH (ref 0.00–1.49)
Prothrombin Time: 23.3 seconds — ABNORMAL HIGH (ref 11.6–15.2)

## 2013-09-26 MED ORDER — WARFARIN SODIUM 7.5 MG PO TABS
7.5000 mg | ORAL_TABLET | ORAL | Status: AC
  Administered 2013-09-26: 7.5 mg via ORAL
  Filled 2013-09-26: qty 1

## 2013-09-26 NOTE — Progress Notes (Signed)
Subjective: [name] states he is not diabetic and would like to have the ss and CBG monitoring stopped. His face is less painful and the lesion on the left face are improved. Pain is controlled.   Objective: Lab:  Recent Labs  09/25/13 0830  WBC 7.6  NEUTROABS 5.4  HGB 13.4  HCT 38.2*  MCV 86.2  PLT 355    Recent Labs  09/24/13 2123  NA 141  K 3.9  CL 104  GLUCOSE 133*  BUN 17  CREATININE 1.20  CALCIUM 9.2   A1C 6.9% Imaging:  Scheduled Meds: . aspirin EC  81 mg Oral Daily  . atorvastatin  80 mg Oral QHS  . carvedilol  12.5 mg Oral BID WC  . furosemide  20 mg Oral Daily  . insulin aspart  0-20 Units Subcutaneous TID WC  . isosorbide mononitrate  30 mg Oral QHS  . losartan  100 mg Oral Daily  . OxyCODONE  20 mg Oral Q12H  . pantoprazole  40 mg Oral BID AC  . potassium chloride SA  80 mEq Oral BID  . sodium chloride  3 mL Intravenous Q12H  . vancomycin  1,000 mg Intravenous Q8H  . [START ON 10/01/2013] warfarin  11.25 mg Oral Q Thu-1800  . warfarin  1 each Does not apply Once  . Warfarin - Physician Dosing Inpatient   Does not apply q1800   Continuous Infusions:  PRN Meds:.sodium chloride, nitroGLYCERIN, oxyCODONE, sodium chloride   Physical Exam: Filed Vitals:   09/26/13 0623  BP: 117/67  Pulse: 58  Temp: 97.4 F (36.3 C)  Resp: 18  overweight man HEENT- [place]/AT Cor- RRR Pulm - normal respirations Derm- folliculitis is improved left face, left zygoma nodule is smaller, right face with persistent folliculitis warmth and tenderness    Assessment/Plan: 1. ID/derm- 1/1 - cefepime, day #2 Vancomycin. Face with decreased redness, swelling and tenderness Plan Continue Vanc  Warm compresses to the face  2. DM-  Patient denies being diabetic but A1C is elevated. Plan D/c ss and cbg monitoring  Continue carb modified diet  3. Chronic pain Stable  4. HTN  Good control  5. CVD - fully anticoagulated.   Illene Regulus [place] IM (o) [phone]; (c)  [phone] Call-grp - Patsi Sears IM  Tele: [phone]  09/26/2013, 8:29 AM

## 2013-09-27 MED ORDER — CLINDAMYCIN HCL 300 MG PO CAPS: 600.0000 mg | ORAL_CAPSULE | Freq: Three times a day (TID) | ORAL | Status: DC

## 2013-09-27 NOTE — Progress Notes (Signed)
Pt talking with nurse about the low potassium and the frequency of urination. Gean Maidens Rn discussed with pt the symptoms of diabetes and the consequences of going untreated. I explained that it is his health and that no one can do it for him. He needs to take the time to learn about it and take care of himself.  Pt agreed to watch videos on diabetes and obtain as much information about it as possible. First segment of videos started.F Grethel Zenk RN

## 2013-09-27 NOTE — Discharge Summary (Signed)
NAMESHUBHAM, THACKSTON NO.:  [id number]  MEDICAL RECORD NO.:  [id number]  LOCATION:  6N13C                        FACILITY:  [facility]  PHYSICIAN:  Tony Gess. Norins, MD  DATE OF BIRTH:  29-Aug-1961  DATE OF ADMISSION:  09/24/2013 DATE OF DISCHARGE:  09/27/2013                              DISCHARGE SUMMARY   ADMITTING DIAGNOSES: 1. Facial cellulitis. 2. Diabetes. 3. Obesity. 4. Hypertension.  DISCHARGE DIAGNOSES: 1. Facial cellulitis. 2. Diabetes. 3. Obesity. 4. Hypertension.  CONSULTANTS:  None.  PROCEDURES:  None.  HISTORY OF PRESENT ILLNESS:  [name] is a 52 year old gentleman who has diet-controlled diabetes who presented to the office complaining of a rash over his cheeks with small pustules over an erythematous base. There was swelling in his cheek and a small nodule lateral to his left thigh in the infraorbital region.  He had no rash elsewhere.  He had no change in contact allergens, no use of lotions or soaps.  He did complain of some pain behind his eyes and headache.  He had no recent changes in medicines or foods.  He does wear CPAP at night, so has been unable to wear CPAP because of rash and inflammation making his face tender to touch.  He has not had any exposure to hot tub, swimming in lakes, or other environmental exposure.  Because of his compromised immunologic status of diabetic and with significant facial rash, he was admitted for IV antibiotics.  Please see the H and P for past medical history, family history, and social history.  HOSPITAL COURSE: 1. ID.  The patient was started initially on cefepime for 1 dose and     then was switched to vancomycin for better coverage.  On this     regimen, he did well with rapid resolution of erythema and     tenderness to his face.  He had no continued retro-orbital pain and     no headache.  On the day of discharge with the patient feeling     better with the rash looking markedly improved,  it was felt he     could be sent home and to complete a course of oral antibiotics     using clindamycin. 2. Diabetes.  Patient did have a hemoglobin A1c drawn this admission,     which came back at 6.9%.  The patient was given this information     and told this did make a definitive diagnosis of diabetes.  While     in hospital, he did take advantage of the opportunity to watch     several videos about diabetic education, diet, and control, this     was instructed for him. 3. Hypertension.  The patient's blood pressure remained stable.  DISCHARGE PHYSICAL EXAMINATION:  VITAL SIGNS:  Temperature was 98.2, blood pressure 153/90, pulse 87, respirations 20, oxygen saturations 92% on room air. GENERAL APPEARANCE:  This is overweight African-American gentleman lying in bed, in no acute distress. HEENT:  Conjunctiva and sclerae was clear.  Pupils equal, round, and reactive. CARDIOVASCULAR:  Patient's precordium was quiet.  He had a regular rate and rhythm.  PULMONARY:  Patient has no increased work of  breathing.  He has got no rales, wheezes, or rhonchi. ABDOMEN:  Obese, soft.  No guarding or rebound. DERM:  Patient has complete resolution of micro-pustules on the left face, he has almost complete resolution of micro-pustules on the right face.  There is no significant erythema.  There is no heat to touch. There is minimal to no tenderness to palpation.  FINAL AVAILABLE LABORATORY:  From September 25, 2013, with a white count of 7600, hemoglobin 13.4 g, platelet count 355,000, normal differential was noted.  Final chemistries were from September 24, 2013, and were normal except for a mean glucose of 151.  GFR was 80.  Hemoglobin A1c 6.9%.  INR was subtherapeutic at 2.15.  Final lab is pending and will be reviewed before discharge.  If in normal limits, he will be released to home.  DISCHARGE MEDICATIONS:  The patient will resume his home medications: 1. aspirin 81 mg daily,  2.  atorvastatin 80 mg daily,  3. carvedilol 12.5 mg daily,  4. Voltaren 1% gel applied to knees as needed 3 times a day, 5. folic acid 1 mg daily,  6. Lasix 20 mg daily,  7. Imdur 30 mg 1 tablet q.24 hours,  8. losartan 100 mg daily,  9. Nasonex nasal spray 2 sprays to each nostril daily,  10.sublingual nitroglycerin 0.4 mg to take q 5 minutes p.r.n., 11.oxycodone IR 5 mg, to take 2 tablets 10 mg 3 times daily prn 12.OxyContin 20 mg taking q.12,  13.Protonix 40 mg to be taken a.m. and p.m.,  14.potassium 20 mEq to take 4 tablets, 80 mEq, bid 15.Coumadin regimen of 7.5 mg daily except Thursday takes11.25mg .  He will follow up in the [facility] [place]. 16.clindamycin 600 mg tid x 7 days.  DISPOSITION:  The patient is discharged to home.  He will be seen in the office by Dr. Yetta Barre within 7 days.  The patient will be called with the appointment time.  The patient's condition at time of discharge dictation is stable and improved.     Tony Gess Norins, MD     MEN/MEDQ  D:  09/27/2013  T:  09/27/2013  Job:  [id number]  cc:   [name], MD

## 2013-09-27 NOTE — Progress Notes (Signed)
Subjective: Feeling much better. No significant facial pain  Objective: Lab:  Recent Labs  09/25/13 0830  WBC 7.6  NEUTROABS 5.4  HGB 13.4  HCT 38.2*  MCV 86.2  PLT 355    Recent Labs  09/24/13 2123  NA 141  K 3.9  CL 104  GLUCOSE 133*  BUN 17  CREATININE 1.20  CALCIUM 9.2    Imaging:  Scheduled Meds: . aspirin EC  81 mg Oral Daily  . atorvastatin  80 mg Oral QHS  . carvedilol  12.5 mg Oral BID WC  . furosemide  20 mg Oral Daily  . isosorbide mononitrate  30 mg Oral QHS  . losartan  100 mg Oral Daily  . OxyCODONE  20 mg Oral Q12H  . pantoprazole  40 mg Oral BID AC  . potassium chloride SA  80 mEq Oral BID  . sodium chloride  3 mL Intravenous Q12H  . vancomycin  1,000 mg Intravenous Q8H  . [START ON 10/01/2013] warfarin  11.25 mg Oral Q Thu-1800  . warfarin  1 each Does not apply Once  . Warfarin - Physician Dosing Inpatient   Does not apply q1800   Continuous Infusions:  PRN Meds:.sodium chloride, nitroGLYCERIN, oxyCODONE, sodium chloride   Physical Exam: Filed Vitals:   09/26/13 2210  BP: 153/90  Pulse: 87  Temp: 98.2 F (36.8 C)  Resp: 20   See d/c/ summary      Assessment/Plan: For discharge to home. Follow up in 5 days.  Dictated # [id number]   Illene Regulus [place] [facility] (o) [phone]; (c) [phone] Call-grp - Patsi Sears IM  Tele: [phone]  09/27/2013, 6:36 AM

## 2013-09-27 NOTE — Discharge Summary (Signed)
Tony Davis, SANDERS NO.:  [id number]  MEDICAL RECORD NO.:  [id number]  LOCATION:  [id number]                        FACILITY:  [facility]  PHYSICIAN:  Rosalyn Gess. Kharee Lesesne, MD  DATE OF BIRTH:  09-Dec-1961  DATE OF ADMISSION:  09/24/2013 DATE OF DISCHARGE:                              DISCHARGE SUMMARY   ADDENDUM:  DISCHARGE MEDICATIONS:  Add clindamycin 600 mg t.i.d. for 7 additional days.  Thank you for the assistance.     Rosalyn Gess Shanecia Hoganson, MD     MEN/MEDQ  D:  09/27/2013  T:  09/27/2013  Job:  [id number]

## 2013-09-27 NOTE — Progress Notes (Signed)
DC instructions gone over with patient, questions answered, verbalized understanding.  Verbalizes the importance of taking complete course of antibiotics as prescribed by MD.    Patient transported to front of hospital via wheelchair accompanied by RN to be taken home by wife.

## 2013-10-01 ENCOUNTER — Ambulatory Visit (INDEPENDENT_AMBULATORY_CARE_PROVIDER_SITE_OTHER): Payer: BC Managed Care – PPO | Admitting: Internal Medicine

## 2013-10-01 ENCOUNTER — Encounter: Payer: Self-pay | Admitting: Internal Medicine

## 2013-10-01 VITALS — BP 130/86 | HR 85 | Temp 97.9°F | Resp 16 | Ht 71.0 in | Wt 276.5 lb

## 2013-10-01 DIAGNOSIS — L0291 Cutaneous abscess, unspecified: Secondary | ICD-10-CM | POA: Diagnosis not present

## 2013-10-01 DIAGNOSIS — IMO0001 Reserved for inherently not codable concepts without codable children: Secondary | ICD-10-CM

## 2013-10-01 DIAGNOSIS — I10 Essential (primary) hypertension: Secondary | ICD-10-CM

## 2013-10-01 DIAGNOSIS — L0390 Cellulitis, unspecified: Secondary | ICD-10-CM

## 2013-10-01 NOTE — Assessment & Plan Note (Signed)
This has resolved.

## 2013-10-01 NOTE — Assessment & Plan Note (Signed)
His BP is well controlled 

## 2013-10-01 NOTE — Progress Notes (Signed)
Subjective:    Patient ID: Tony Davis, male    DOB: 17-May-1961, 52 y.o.   MRN: [id number]  Diabetes He presents for his follow-up diabetic visit. He has type 2 diabetes mellitus. His disease course has been stable. There are no hypoglycemic associated symptoms. Pertinent negatives for hypoglycemia include no pallor. Pertinent negatives for diabetes include no blurred vision, no chest pain, no fatigue, no foot paresthesias, no foot ulcerations, no polydipsia, no polyphagia, no polyuria, no visual change, no weakness and no weight loss. Symptoms are stable. Diabetic complications include a CVA. There are no known risk factors for coronary artery disease. Current diabetic treatment includes diet. He is compliant with treatment none of the time. He is following a generally healthy diet. When asked about meal planning, he reported none. He never participates in exercise. His home blood glucose trend is increasing steadily. An ACE inhibitor/angiotensin II receptor blocker is being taken. He does not see a podiatrist.Eye exam is current.      Review of Systems  Constitutional: Negative.  Negative for fever, chills, weight loss, diaphoresis, appetite change and fatigue.  HENT: Negative.  Negative for facial swelling, sinus pressure, trouble swallowing and voice change.   Eyes: Negative.  Negative for blurred vision, photophobia, redness and visual disturbance.  Respiratory: Negative.  Negative for cough, chest tightness, shortness of breath, wheezing and stridor.   Cardiovascular: Negative.  Negative for chest pain, palpitations and leg swelling.  Gastrointestinal: Negative.  Negative for nausea, vomiting, abdominal pain, diarrhea and constipation.  Endocrine: Negative.  Negative for polydipsia, polyphagia and polyuria.  Genitourinary: Negative.   Musculoskeletal: Negative.   Skin: Negative.  Negative for color change, pallor, rash and wound.  Allergic/Immunologic: Negative.   Neurological:  Negative.  Negative for weakness.  Hematological: Negative.  Negative for adenopathy. Does not bruise/bleed easily.  Psychiatric/Behavioral: Negative.        Objective:   Physical Exam  Vitals reviewed. Constitutional: He is oriented to person, place, and time. He appears well-developed and well-nourished.  Non-toxic appearance. He does not have a sickly appearance. He does not appear ill. No distress.  HENT:  Head: Normocephalic and atraumatic.  Mouth/Throat: Oropharynx is clear and moist. No oropharyngeal exudate.  His face is normal today, there is no swelling/exudate/warmth/tenderness/erythema/pustules.  Eyes: Conjunctivae are normal. Right eye exhibits no discharge. Left eye exhibits no discharge. No scleral icterus.  Neck: Normal range of motion. Neck supple. No JVD present. No tracheal deviation present. No thyromegaly present.  Cardiovascular: Normal rate, regular rhythm, normal heart sounds and intact distal pulses.  Exam reveals no gallop and no friction rub.   No murmur heard. Pulmonary/Chest: Effort normal and breath sounds normal. No stridor. No respiratory distress. He has no wheezes. He has no rales. He exhibits no tenderness.  Abdominal: Soft. Bowel sounds are normal. He exhibits no distension and no mass. There is no tenderness. There is no rebound and no guarding.  Musculoskeletal: Normal range of motion. He exhibits no edema and no tenderness.  Lymphadenopathy:    He has no cervical adenopathy.  Neurological: He is oriented to person, place, and time.  Skin: Skin is warm and dry. No rash noted. He is not diaphoretic. No erythema. No pallor.     Lab Results  Component Value Date   WBC 7.6 09/25/2013   HGB 13.4 09/25/2013   HCT 38.2* 09/25/2013   PLT 355 09/25/2013   GLUCOSE 133* 09/24/2013   CHOL 240* 04/23/2013   TRIG 170* 04/23/2013   HDL  27* 04/23/2013   LDLDIRECT 154.3 03/06/2013   LDLCALC 179* 04/23/2013   ALT 21 09/18/2013   AST 22 09/18/2013   NA 141  09/24/2013   K 3.9 09/24/2013   CL 104 09/24/2013   CREATININE 1.20 09/24/2013   BUN 17 09/24/2013   CO2 28 09/24/2013   TSH 1.693 04/22/2013   PSA 0.38 08/06/2011   INR 1.85* 09/27/2013   HGBA1C 6.9* 09/24/2013       Assessment & Plan:

## 2013-10-01 NOTE — Patient Instructions (Signed)
Type 2 Diabetes Mellitus, Adult Type 2 diabetes mellitus, often simply referred to as type 2 diabetes, is a long-lasting (chronic) disease. In type 2 diabetes, the pancreas does not make enough insulin (a hormone), the cells are less responsive to the insulin that is made (insulin resistance), or both. Normally, insulin moves sugars from food into the tissue cells. The tissue cells use the sugars for energy. The lack of insulin or the lack of normal response to insulin causes excess sugars to build up in the blood instead of going into the tissue cells. As a result, high blood sugar (hyperglycemia) develops. The effect of high sugar (glucose) levels can cause many complications. Type 2 diabetes was also previously called adult-onset diabetes but it can occur at any age.  RISK FACTORS  A person is predisposed to developing type 2 diabetes if someone in the family has the disease and also has one or more of the following primary risk factors:  Overweight.  An inactive lifestyle.  A history of consistently eating high-calorie foods. Maintaining a normal weight and regular physical activity can reduce the chance of developing type 2 diabetes. SYMPTOMS  A person with type 2 diabetes may not show symptoms initially. The symptoms of type 2 diabetes appear slowly. The symptoms include:  Increased thirst (polydipsia).  Increased urination (polyuria).  Increased urination during the night (nocturia).  Weight loss. This weight loss may be rapid.  Frequent, recurring infections.  Tiredness (fatigue).  Weakness.  Vision changes, such as blurred vision.  Fruity smell to your breath.  Abdominal pain.  Nausea or vomiting.  Cuts or bruises which are slow to heal.  Tingling or numbness in the hands or feet. DIAGNOSIS Type 2 diabetes is frequently not diagnosed until complications of diabetes are present. Type 2 diabetes is diagnosed when symptoms or complications are present and when blood  glucose levels are increased. Your blood glucose level may be checked by one or more of the following blood tests:  A fasting blood glucose test. You will not be allowed to eat for at least 8 hours before a blood sample is taken.  A random blood glucose test. Your blood glucose is checked at any time of the day regardless of when you ate.  A hemoglobin A1c blood glucose test. A hemoglobin A1c test provides information about blood glucose control over the previous 3 months.  An oral glucose tolerance test (OGTT). Your blood glucose is measured after you have not eaten (fasted) for 2 hours and then after you drink a glucose-containing beverage. TREATMENT   You may need to take insulin or diabetes medicine daily to keep blood glucose levels in the desired range.  You will need to match insulin dosing with exercise and healthy food choices. The treatment goal is to maintain the before meal blood sugar (preprandial glucose) level at 70 130 mg/dL. HOME CARE INSTRUCTIONS   Have your hemoglobin A1c level checked twice a year.  Perform daily blood glucose monitoring as directed by your caregiver.  Monitor urine ketones when you are ill and as directed by your caregiver.  Take your diabetes medicine or insulin as directed by your caregiver to maintain your blood glucose levels in the desired range.  Never run out of diabetes medicine or insulin. It is needed every day.  Adjust insulin based on your intake of carbohydrates. Carbohydrates can raise blood glucose levels but need to be included in your diet. Carbohydrates provide vitamins, minerals, and fiber which are an essential part of   a healthy diet. Carbohydrates are found in fruits, vegetables, whole grains, dairy products, legumes, and foods containing added sugars.    Eat healthy foods. Alternate 3 meals with 3 snacks.  Lose weight if overweight.  Carry a medical alert card or wear your medical alert jewelry.  Carry a 15 gram  carbohydrate snack with you at all times to treat low blood glucose (hypoglycemia). Some examples of 15 gram carbohydrate snacks include:  Glucose tablets, 3 or 4   Glucose gel, 15 gram tube  Raisins, 2 tablespoons (24 grams)  Jelly beans, 6  Animal crackers, 8  Regular pop, 4 ounces (120 mL)  Gummy treats, 9  Recognize hypoglycemia. Hypoglycemia occurs with blood glucose levels of 70 mg/dL and below. The risk for hypoglycemia increases when fasting or skipping meals, during or after intense exercise, and during sleep. Hypoglycemia symptoms can include:  Tremors or shakes.  Decreased ability to concentrate.  Sweating.  Increased heart rate.  Headache.  Dry mouth.  Hunger.  Irritability.  Anxiety.  Restless sleep.  Altered speech or coordination.  Confusion.  Treat hypoglycemia promptly. If you are alert and able to safely swallow, follow the 15:15 rule:  Take 15 20 grams of rapid-acting glucose or carbohydrate. Rapid-acting options include glucose gel, glucose tablets, or 4 ounces (120 mL) of fruit juice, regular soda, or low fat milk.  Check your blood glucose level 15 minutes after taking the glucose.  Take 15 20 grams more of glucose if the repeat blood glucose level is still 70 mg/dL or below.  Eat a meal or snack within 1 hour once blood glucose levels return to normal.    Be alert to polyuria and polydipsia which are early signs of hyperglycemia. An early awareness of hyperglycemia allows for prompt treatment. Treat hyperglycemia as directed by your caregiver.  Engage in at least 150 minutes of moderate-intensity physical activity a week, spread over at least 3 days of the week or as directed by your caregiver. In addition, you should engage in resistance exercise at least 2 times a week or as directed by your caregiver.  Adjust your medicine and food intake as needed if you start a new exercise or sport.  Follow your sick day plan at any time you  are unable to eat or drink as usual.  Avoid tobacco use.  Limit alcohol intake to no more than 1 drink per day for nonpregnant women and 2 drinks per day for men. You should drink alcohol only when you are also eating food. Talk with your caregiver whether alcohol is safe for you. Tell your caregiver if you drink alcohol several times a week.  Follow up with your caregiver regularly.  Schedule an eye exam soon after the diagnosis of type 2 diabetes and then annually.  Perform daily skin and foot care. Examine your skin and feet daily for cuts, bruises, redness, nail problems, bleeding, blisters, or sores. A foot exam by a caregiver should be done annually.  Brush your teeth and gums at least twice a day and floss at least once a day. Follow up with your dentist regularly.  Share your diabetes management plan with your workplace or school.  Stay up-to-date with immunizations.  Learn to manage stress.  Obtain ongoing diabetes education and support as needed.  Participate in, or seek rehabilitation as needed to maintain or improve independence and quality of life. Request a physical or occupational therapy referral if you are having foot or hand numbness or difficulties with grooming,   dressing, eating, or physical activity. SEEK MEDICAL CARE IF:   You are unable to eat food or drink fluids for more than 6 hours.  You have nausea and vomiting for more than 6 hours.  Your blood glucose level is over 240 mg/dL.  There is a change in mental status.  You develop an additional serious illness.  You have diarrhea for more than 6 hours.  You have been sick or have had a fever for a couple of days and are not getting better.  You have pain during any physical activity.  SEEK IMMEDIATE MEDICAL CARE IF:  You have difficulty breathing.  You have moderate to large ketone levels. MAKE SURE YOU:  Understand these instructions.  Will watch your condition.  Will get help right away if  you are not doing well or get worse. Document Released: 11/26/2005 Document Revised: 08/20/2012 Document Reviewed: 06/24/2012 [employer] Patient Information 2014 [employer].  

## 2013-10-01 NOTE — Assessment & Plan Note (Signed)
He agrees to improve his lifestyle modifications

## 2013-10-09 ENCOUNTER — Other Ambulatory Visit: Payer: Self-pay | Admitting: Cardiology

## 2013-10-14 ENCOUNTER — Other Ambulatory Visit: Payer: Self-pay | Admitting: Internal Medicine

## 2013-10-20 ENCOUNTER — Encounter: Payer: Self-pay | Admitting: Cardiovascular Disease

## 2013-10-20 ENCOUNTER — Ambulatory Visit (INDEPENDENT_AMBULATORY_CARE_PROVIDER_SITE_OTHER): Payer: BC Managed Care – PPO | Admitting: Cardiovascular Disease

## 2013-10-20 ENCOUNTER — Ambulatory Visit (INDEPENDENT_AMBULATORY_CARE_PROVIDER_SITE_OTHER): Payer: BC Managed Care – PPO | Admitting: Pharmacist

## 2013-10-20 VITALS — BP 150/86 | HR 80 | Ht 72.0 in | Wt 268.0 lb

## 2013-10-20 DIAGNOSIS — I10 Essential (primary) hypertension: Secondary | ICD-10-CM | POA: Diagnosis not present

## 2013-10-20 DIAGNOSIS — Z87891 Personal history of nicotine dependence: Secondary | ICD-10-CM

## 2013-10-20 DIAGNOSIS — Z7901 Long term (current) use of anticoagulants: Secondary | ICD-10-CM | POA: Diagnosis not present

## 2013-10-20 DIAGNOSIS — I2510 Atherosclerotic heart disease of native coronary artery without angina pectoris: Secondary | ICD-10-CM | POA: Diagnosis not present

## 2013-10-20 DIAGNOSIS — Q211 Atrial septal defect: Secondary | ICD-10-CM

## 2013-10-20 DIAGNOSIS — I82409 Acute embolism and thrombosis of unspecified deep veins of unspecified lower extremity: Secondary | ICD-10-CM

## 2013-10-20 DIAGNOSIS — F17201 Nicotine dependence, unspecified, in remission: Secondary | ICD-10-CM

## 2013-10-20 DIAGNOSIS — G4730 Sleep apnea, unspecified: Secondary | ICD-10-CM

## 2013-10-20 DIAGNOSIS — I6350 Cerebral infarction due to unspecified occlusion or stenosis of unspecified cerebral artery: Secondary | ICD-10-CM

## 2013-10-20 LAB — POCT INR: INR: 2.9

## 2013-10-20 NOTE — Patient Instructions (Signed)
Your physician wants you to follow-up in:  12 months.  You will receive a reminder letter in the mail two months in advance. If you don't receive a letter, please call our office to schedule the follow-up appointment.   

## 2013-10-20 NOTE — Progress Notes (Signed)
History of Present Illness: 52 yo AAM with history of CAD, HTN, hyperlipidemia, impaired fasting glucose and obesity who is here today for cardiac follow up. I saw him in February 2011 for complaints of chest pain and dizziness. I arranged a stress test, an echo and carotid dopplers at the first visit. The carotid dopplers were normal. He came in for his stress test and complained of neurological changes. He was seen by Dr. Riley Kill and the stress test was cancelled. He was sent over to Dr. Stones office and had MRI/MRA of the head and neck. MRA showed no occlusive disease. MRI showed non-specific white matter changes but no clear infarct, possibly related to hypertension. His echo showed septal and inferobasal hypokinesis with mild concentric hypertrophy, EF 45-50%. Left heart cath October 2011 with severe stenosis in the mid Circumflex. PCI Circumflex with placement of Promus DES 10/13/10. Repeat echo August 2012 with PFO with positive bubble study. I arranged for him to see Dr. Excell Seltzer for PFO closure but he skipped his appointment. He was in [place] April 2013 and had acute onset of left arm and leg weakness. He was admitted there. CT head without acute stroke. Venous dopplers with chronic DVT right femoral vein. Carotids with <50% RICA stenosis, LICA ok. Echo with PFO. Echo 03/14/12 in [place] with normal LV size and function with PFO with positive bubble study. He was started on coumadin and Lovenox and left AMA from the hospital there. He was seen in [facility] ED on 03/17/12 and discharged home. I saw him in our office 03/19/12. At that time, he continued to have left sided weakness. He had not been compliant with plans for Lovenox and coumadin. He was admitted for Heparin and coumadin. TEE was done 03/21/12: EF normal, atrial septum with suspicion for interatrial septum fenestrations without flow across and few large bubbles noted in LA. This was not felt to require closure. He has been maintained on coumadin and  has been followed in our coumadin clinic.  Venous dopplers 2013 with no DVT.  He has been been diagnosed with OSA and has been started on CPAP. He was admitted to [facility] 04/22/13 with chest pain. Cardiac markers were negative. Cardiac cath 04/24/13 with stable CAD. Patent stent distal Circumflex, mild disease LAD, moderate disease RCA.   He is here today for follow up. He tells me that he has been feeling well. No chest pain or SOB. He has stopped smoking in April 2013. Tolerating coumadin with no bleeding. He has bilateral knee pain and has talked about having knee replacement but does not wish to pursue at this time.   Primary Care Physician: [name]  Past Medical History  Diagnosis Date  . GERD (gastroesophageal reflux disease)   . HLD (hyperlipidemia)   . HTN (hypertension)   . Hematuria 8/12  . PFO (patent foramen ovale)     echo 8/12: EF 55-65%, grade 2 diast dysfnx; +PFO on bubble study;  TEE 4/13: EF normal, atrial septum with suspicion for interatrial septum fenestrations without flow across and few large bubbles noted in LA.  This was not felt to require closure   . CAD (coronary artery disease)     a. s/p promus DES circumflex artery 10/13/10. b. Cath 04/2013: stable disease but possible small vessel disease -Imdur added.  Marland Kitchen DVT (deep venous thrombosis) 2013    "right"   . TIA (transient ischemic attack)     "1-2; both after the one in 02/2010" (09/25/2013)  . OSA on  CPAP   . Type II diabetes mellitus     June 2014 AIC 6.2%/notes 09/24/2013; pt denies this hx on 09/25/2013  . Arthritis     "knees" (09/25/2013)  . Rheumatoid arthritis(714.0)   . CVA (cerebral vascular accident) 02/2010    "lost peripheral vision when I had the stroke; some memory issues since" (09/25/2013)    Past Surgical History  Procedure Laterality Date  . Knee arthroscopy Bilateral 2000's    2 on left and 3 on rt  . Tee without cardioversion  03/21/2012    Procedure: TRANSESOPHAGEAL ECHOCARDIOGRAM  (TEE);  Surgeon: Pricilla Riffle, MD;  Location: [facility] ENDOSCOPY;  Service: Cardiovascular;  Laterality: N/A;  . Coronary angioplasty with stent placement  2012    "1" (09/24/2013)    Current Outpatient Prescriptions  Medication Sig Dispense Refill  . aspirin EC 81 MG EC tablet Take 1 tablet (81 mg total) by mouth daily.      Marland Kitchen atorvastatin (LIPITOR) 80 MG tablet Take 1 tablet (80 mg total) by mouth at bedtime.  90 tablet  1  . carvedilol (COREG) 12.5 MG tablet TAKE 1 TABLET BY MOUTH TWICE DAILY  60 tablet  1  . clindamycin (CLEOCIN) 300 MG capsule Take 2 capsules (600 mg total) by mouth 3 (three) times daily.  42 capsule  0  . diclofenac sodium (VOLTAREN) 1 % GEL Apply 1 application topically 3 (three) times daily. To knees      . diphenoxylate-atropine (LOMOTIL) 2.5-0.025 MG per tablet       . folic acid (FOLVITE) 1 MG tablet TAKE 1 TABLET BY MOUTH DAILY  90 tablet  3  . furosemide (LASIX) 20 MG tablet Take 20 mg by mouth daily.      . isosorbide mononitrate (IMDUR) 30 MG 24 hr tablet Take 1 tablet (30 mg total) by mouth at bedtime.  30 tablet  6  . losartan (COZAAR) 100 MG tablet TAKE 1 TABLET BY MOUTH DAILY  30 tablet  5  . mometasone (NASONEX) 50 MCG/ACT nasal spray Place 2 sprays into the nose daily. To each nostril      . nitroGLYCERIN (NITROSTAT) 0.4 MG SL tablet Place 1 tablet (0.4 mg total) under the tongue every 5 (five) minutes as needed for chest pain (up to 3 doses).  25 tablet  3  . OxyCODONE (OXYCONTIN) 20 mg T12A 12 hr tablet Take 1 tablet (20 mg total) by mouth every 12 (twelve) hours.  60 tablet  0  . oxyCODONE-acetaminophen (PERCOCET) 10-325 MG per tablet Take 1 tablet by mouth every 8 (eight) hours as needed for pain.      . pantoprazole (PROTONIX) 40 MG tablet Take 1 tablet (40 mg total) by mouth 2 (two) times daily before a meal.  60 tablet  5  . potassium chloride SA (KLOR-CON M20) 20 MEQ tablet Take 4 tablets (80 mEq total) by mouth 2 (two) times daily.  224 tablet  11  .  warfarin (COUMADIN) 7.5 MG tablet Take 7.5-11.25 mg by mouth daily. Takes 1 tablet daily, except 1.5 tab on Thursday       No current facility-administered medications for this visit.    Allergies  Allergen Reactions  . Iodine     REACTION: anaphylaxis  . Amlodipine     Foot edema    History   Social History  . Marital Status: Married    Spouse Name: N/A    Number of Children: N/A  . Years of Education: N/A  Occupational History  . Not on file.   Social History Main Topics  . Smoking status: Former Smoker -- 1.00 packs/day for 35 years    Types: Cigarettes    Quit date: 03/10/2012  . Smokeless tobacco: Never Used  . Alcohol Use: No  . Drug Use: No  . Sexual Activity: Yes   Other Topics Concern  . Not on file   Social History Narrative   Married, 5 kids; lives in [place]. 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     Family History  Problem Relation Age of Onset  . Cancer Neg Hx   . Kidney disease Neg Hx   . Diabetes Neg Hx     Review of Systems:  As stated in the HPI and otherwise negative.   BP 150/86  Pulse 80  Ht 6' (1.829 m)  Wt 268 lb (121.564 kg)  BMI 36.34 kg/m2  SpO2 97%  Physical Examination: General: Well developed, well nourished, NAD HEENT: OP clear, mucus membranes moist SKIN: warm, dry. No rashes. Neuro: No focal deficits Musculoskeletal: Muscle strength 5/5 all ext Psychiatric: Mood and affect normal Neck: No JVD, no carotid bruits, no thyromegaly, no lymphadenopathy. Lungs:Clear bilaterally, no wheezes, rhonci, crackles Cardiovascular: Regular rate and rhythm. No murmurs, gallops or rubs. Abdomen:Soft. Bowel sounds present. Non-tender.  Extremities: No lower extremity edema. Pulses are 2 + in the bilateral DP/PT.  Cardiac cath 04/24/13: Left main: No obstructive disease noted.  Left Anterior Descending Artery: Large caliber vessel that does not reach the apex. There are minor luminal irregularities in the mid vessel.  The diagonal branch is moderate in caliber and has no obstructive disease.  Circumflex Artery: Large caliber vessel with moderate caliber intermediate branch followed by two obtuse marginal branches. The intermediate branch has minor plaque disease. The two obtuse marginal branches have minor plaque disease. The stent in the distal AV groove Circumflex is patent with no restenosis.  Right Coronary Artery: Large caliber, dominant vessel that wraps around the apex. The proximal vessel has minor luminal irregularities. The mid vessel has diffuse 30% stenosis. The PDA is very large and reaches the apex. The PDA has minor plaque disease. The Posterolateral branch is a moderate caliber bifurcating vessel. The ostium of the posterolateral branch has 40% stenosis. The distal portion of this vessel become small in caliber then bifurcates and there is a 70% stenosis at the bifurcation involving both the superior and inferior sub-branches. This is unchanged in appearance since his last cath in 2011. This vessel is too small for PCI.  Left Ventricular Angiogram: LVEF=55-60%.   Assessment and Plan:   1. CAD, NATIVE VESSEL: Stable. Will continue ASA, statin, beta blocker, ARB, Imdur.   2. DVT: Will continue coumadin. This is also used for his neurological changes with possible very small PFO and prior CVA/TIA.   3. GERD: Continue Protonix 40 mg po QDaily.   4. HYPERTENSION : BP elevated. Will continue Cozaar, Norvasc and Coreg.   5. PATENT FORAMEN OVALE: No evidence of significant PFO on TEE. He is on coumadin for small fenestrations in atrial septum and neurological changes. Recommend long term anti-coagulation.   6. OSA with daytime somnolence: He has seen Dr. Maple Hudson and is being fitted for a CPAP mask.   7. Tobacco abuse, in remission: He stopped smoking April 2013.

## 2013-10-22 ENCOUNTER — Ambulatory Visit: Payer: BC Managed Care – PPO | Admitting: Internal Medicine

## 2013-10-23 ENCOUNTER — Other Ambulatory Visit: Payer: Self-pay | Admitting: Physician Assistant

## 2013-10-25 ENCOUNTER — Emergency Department (HOSPITAL_COMMUNITY): Payer: BC Managed Care – PPO

## 2013-10-25 ENCOUNTER — Emergency Department (HOSPITAL_COMMUNITY)
Admission: EM | Admit: 2013-10-25 | Discharge: 2013-10-25 | Disposition: A | Discharge status: Home / Self Care | Payer: BC Managed Care – PPO | Attending: Emergency Medicine | Admitting: Emergency Medicine

## 2013-10-25 ENCOUNTER — Encounter (HOSPITAL_COMMUNITY): Payer: Self-pay | Admitting: Emergency Medicine

## 2013-10-25 DIAGNOSIS — Z7901 Long term (current) use of anticoagulants: Secondary | ICD-10-CM | POA: Insufficient documentation

## 2013-10-25 DIAGNOSIS — M069 Rheumatoid arthritis, unspecified: Secondary | ICD-10-CM | POA: Insufficient documentation

## 2013-10-25 DIAGNOSIS — I2510 Atherosclerotic heart disease of native coronary artery without angina pectoris: Secondary | ICD-10-CM | POA: Insufficient documentation

## 2013-10-25 DIAGNOSIS — Z792 Long term (current) use of antibiotics: Secondary | ICD-10-CM | POA: Insufficient documentation

## 2013-10-25 DIAGNOSIS — Z9981 Dependence on supplemental oxygen: Secondary | ICD-10-CM | POA: Insufficient documentation

## 2013-10-25 DIAGNOSIS — G4733 Obstructive sleep apnea (adult) (pediatric): Secondary | ICD-10-CM | POA: Insufficient documentation

## 2013-10-25 DIAGNOSIS — R109 Unspecified abdominal pain: Secondary | ICD-10-CM | POA: Insufficient documentation

## 2013-10-25 DIAGNOSIS — K219 Gastro-esophageal reflux disease without esophagitis: Secondary | ICD-10-CM | POA: Insufficient documentation

## 2013-10-25 DIAGNOSIS — Z8673 Personal history of transient ischemic attack (TIA), and cerebral infarction without residual deficits: Secondary | ICD-10-CM | POA: Insufficient documentation

## 2013-10-25 DIAGNOSIS — E119 Type 2 diabetes mellitus without complications: Secondary | ICD-10-CM | POA: Insufficient documentation

## 2013-10-25 DIAGNOSIS — Z87891 Personal history of nicotine dependence: Secondary | ICD-10-CM | POA: Insufficient documentation

## 2013-10-25 DIAGNOSIS — Z7982 Long term (current) use of aspirin: Secondary | ICD-10-CM | POA: Insufficient documentation

## 2013-10-25 DIAGNOSIS — Z79899 Other long term (current) drug therapy: Secondary | ICD-10-CM | POA: Insufficient documentation

## 2013-10-25 DIAGNOSIS — Z86718 Personal history of other venous thrombosis and embolism: Secondary | ICD-10-CM | POA: Insufficient documentation

## 2013-10-25 DIAGNOSIS — R197 Diarrhea, unspecified: Secondary | ICD-10-CM | POA: Insufficient documentation

## 2013-10-25 DIAGNOSIS — M1710 Unilateral primary osteoarthritis, unspecified knee: Secondary | ICD-10-CM | POA: Insufficient documentation

## 2013-10-25 DIAGNOSIS — I10 Essential (primary) hypertension: Secondary | ICD-10-CM | POA: Insufficient documentation

## 2013-10-25 DIAGNOSIS — IMO0002 Reserved for concepts with insufficient information to code with codable children: Secondary | ICD-10-CM | POA: Insufficient documentation

## 2013-10-25 DIAGNOSIS — E785 Hyperlipidemia, unspecified: Secondary | ICD-10-CM | POA: Insufficient documentation

## 2013-10-25 DIAGNOSIS — Z9861 Coronary angioplasty status: Secondary | ICD-10-CM | POA: Insufficient documentation

## 2013-10-25 DIAGNOSIS — Z8774 Personal history of (corrected) congenital malformations of heart and circulatory system: Secondary | ICD-10-CM | POA: Insufficient documentation

## 2013-10-25 DIAGNOSIS — R062 Wheezing: Secondary | ICD-10-CM | POA: Insufficient documentation

## 2013-10-25 DIAGNOSIS — Z791 Long term (current) use of non-steroidal anti-inflammatories (NSAID): Secondary | ICD-10-CM | POA: Insufficient documentation

## 2013-10-25 LAB — CBC WITH DIFFERENTIAL/PLATELET
Basophils Relative: 0 % (ref 0–1)
Eosinophils Absolute: 0.6 10*3/uL (ref 0.0–0.7)
Eosinophils Relative: 7 % — ABNORMAL HIGH (ref 0–5)
HCT: 43.9 % (ref 39.0–52.0)
Hemoglobin: 15.2 g/dL (ref 13.0–17.0)
MCH: 30.2 pg (ref 26.0–34.0)
MCHC: 34.6 g/dL (ref 30.0–36.0)
MCV: 87.1 fL (ref 78.0–100.0)
Monocytes Absolute: 0.9 10*3/uL (ref 0.1–1.0)
Monocytes Relative: 11 % (ref 3–12)
Neutro Abs: 4.3 10*3/uL (ref 1.7–7.7)
RBC: 5.04 MIL/uL (ref 4.22–5.81)
RDW: 13.2 % (ref 11.5–15.5)

## 2013-10-25 LAB — COMPREHENSIVE METABOLIC PANEL
Albumin: 4.2 g/dL (ref 3.5–5.2)
BUN: 14 mg/dL (ref 6–23)
Calcium: 9.4 mg/dL (ref 8.4–10.5)
Chloride: 103 mEq/L (ref 96–112)
Creatinine, Ser: 1.14 mg/dL (ref 0.50–1.35)
Total Bilirubin: 0.5 mg/dL (ref 0.3–1.2)
Total Protein: 8.3 g/dL (ref 6.0–8.3)

## 2013-10-25 LAB — PROTIME-INR
INR: 2.32 — ABNORMAL HIGH (ref 0.00–1.49)
Prothrombin Time: 24.7 seconds — ABNORMAL HIGH (ref 11.6–15.2)

## 2013-10-25 MED ORDER — METRONIDAZOLE 500 MG PO TABS: 500.0000 mg | ORAL_TABLET | Freq: Two times a day (BID) | ORAL | Status: DC

## 2013-10-25 MED ORDER — MORPHINE SULFATE 4 MG/ML IJ SOLN
4.0000 mg | Freq: Once | INTRAMUSCULAR | Status: AC
  Administered 2013-10-25: 4 mg via INTRAVENOUS
  Filled 2013-10-25: qty 1

## 2013-10-25 MED ORDER — SODIUM CHLORIDE 0.9 % IV BOLUS (SEPSIS)
1000.0000 mL | Freq: Once | INTRAVENOUS | Status: AC
  Administered 2013-10-25: 1000 mL via INTRAVENOUS

## 2013-10-25 MED ORDER — ONDANSETRON HCL 4 MG/2ML IJ SOLN
4.0000 mg | Freq: Once | INTRAMUSCULAR | Status: AC
  Administered 2013-10-25: 4 mg via INTRAVENOUS
  Filled 2013-10-25: qty 2

## 2013-10-25 MED ORDER — METRONIDAZOLE 500 MG PO TABS
500.0000 mg | ORAL_TABLET | Freq: Once | ORAL | Status: AC
  Administered 2013-10-25: 500 mg via ORAL
  Filled 2013-10-25: qty 1

## 2013-10-25 NOTE — ED Notes (Signed)
Pt c/o abdominal pain with nausea and diarrhea onset Wednesday.

## 2013-10-25 NOTE — ED Notes (Signed)
Pt comfortable with d/c and f/u instructions. Prescriptions x1 

## 2013-10-25 NOTE — ED Notes (Signed)
Patient asked for urine sample. States that he is unable at this time.

## 2013-10-25 NOTE — ED Provider Notes (Signed)
CSN: [id number]     Arrival date & time 10/25/13  1527 History   First MD Initiated Contact with Patient 10/25/13 1559     Chief Complaint  Patient presents with  . Abdominal Pain  . Diarrhea   (Consider location/radiation/quality/duration/timing/severity/associated sxs/prior Treatment) Patient is a 52 y.o. male presenting with diarrhea. The history is provided by the patient.  Diarrhea Quality:  Oily and mucous Severity:  Severe Onset quality:  Sudden Number of episodes:  5 days with >8episodes per day Duration:  5 days Timing:  Constant Progression:  Unchanged Relieved by:  Nothing Exacerbated by: eating, drinking or taking medication. Ineffective treatments:  None tried Associated symptoms: no chills, no fever, no URI and no vomiting   Associated symptoms comment:  Abdominal cramping before diarrhea comes Risk factors: recent antibiotic use   Risk factors: no sick contacts   Risk factors comment:  Clindamycin last month   Past Medical History  Diagnosis Date  . GERD (gastroesophageal reflux disease)   . HLD (hyperlipidemia)   . HTN (hypertension)   . Hematuria 8/12  . PFO (patent foramen ovale)     echo 8/12: EF 55-65%, grade 2 diast dysfnx; +PFO on bubble study;  TEE 4/13: EF normal, atrial septum with suspicion for interatrial septum fenestrations without flow across and few large bubbles noted in LA.  This was not felt to require closure   . CAD (coronary artery disease)     a. s/p promus DES circumflex artery 10/13/10. b. Cath 04/2013: stable disease but possible small vessel disease -Imdur added.  Marland Kitchen DVT (deep venous thrombosis) 2013    "right"   . TIA (transient ischemic attack)     "1-2; both after the one in 02/2010" (09/25/2013)  . OSA on CPAP   . Type II diabetes mellitus     June 2014 AIC 6.2%/notes 09/24/2013; pt denies this hx on 09/25/2013  . Arthritis     "knees" (09/25/2013)  . Rheumatoid arthritis(714.0)   . CVA (cerebral vascular accident) 02/2010   "lost peripheral vision when I had the stroke; some memory issues since" (09/25/2013)   Past Surgical History  Procedure Laterality Date  . Knee arthroscopy Bilateral 2000's    2 on left and 3 on rt  . Tee without cardioversion  03/21/2012    Procedure: TRANSESOPHAGEAL ECHOCARDIOGRAM (TEE);  Surgeon: Pricilla Riffle, MD;  Location: [facility] ENDOSCOPY;  Service: Cardiovascular;  Laterality: N/A;  . Coronary angioplasty with stent placement  2012    "1" (09/24/2013)   Family History  Problem Relation Age of Onset  . Cancer Neg Hx   . Kidney disease Neg Hx   . Diabetes Neg Hx    History  Substance Use Topics  . Smoking status: Former Smoker -- 1.00 packs/day for 35 years    Types: Cigarettes    Quit date: 03/10/2012  . Smokeless tobacco: Never Used  . Alcohol Use: No    Review of Systems  Constitutional: Negative for fever and chills.  Respiratory: Negative for cough and shortness of breath.   Gastrointestinal: Positive for diarrhea. Negative for vomiting.  Genitourinary: Negative for dysuria.  All other systems reviewed and are negative.    Allergies  Iodine and Amlodipine  Home Medications   Current Outpatient Rx  Name  Route  Sig  Dispense  Refill  . aspirin EC 81 MG EC tablet   Oral   Take 1 tablet (81 mg total) by mouth daily.         Marland Kitchen  atorvastatin (LIPITOR) 80 MG tablet   Oral   Take 1 tablet (80 mg total) by mouth at bedtime.   90 tablet   1   . carvedilol (COREG) 12.5 MG tablet      TAKE 1 TABLET BY MOUTH TWICE DAILY   60 tablet   1     CYCLE FILL MEDICATION. Authorization is required f ...   . clindamycin (CLEOCIN) 300 MG capsule   Oral   Take 2 capsules (600 mg total) by mouth 3 (three) times daily.   42 capsule   0   . diclofenac sodium (VOLTAREN) 1 % GEL   Topical   Apply 1 application topically 3 (three) times daily. To knees         . diphenoxylate-atropine (LOMOTIL) 2.5-0.025 MG per tablet               . folic acid (FOLVITE) 1 MG  tablet      TAKE 1 TABLET BY MOUTH DAILY   90 tablet   3   . furosemide (LASIX) 20 MG tablet   Oral   Take 20 mg by mouth daily.         . isosorbide mononitrate (IMDUR) 30 MG 24 hr tablet   Oral   Take 1 tablet (30 mg total) by mouth at bedtime.   30 tablet   6   . losartan (COZAAR) 100 MG tablet      TAKE 1 TABLET BY MOUTH DAILY   30 tablet   5   . mometasone (NASONEX) 50 MCG/ACT nasal spray   Nasal   Place 2 sprays into the nose daily. To each nostril         . nitroGLYCERIN (NITROSTAT) 0.4 MG SL tablet   Sublingual   Place 1 tablet (0.4 mg total) under the tongue every 5 (five) minutes as needed for chest pain (up to 3 doses).   25 tablet   3   . OxyCODONE (OXYCONTIN) 20 mg T12A 12 hr tablet   Oral   Take 1 tablet (20 mg total) by mouth every 12 (twelve) hours.   60 tablet   0   . oxyCODONE-acetaminophen (PERCOCET) 10-325 MG per tablet   Oral   Take 1 tablet by mouth every 8 (eight) hours as needed for pain.         . pantoprazole (PROTONIX) 40 MG tablet   Oral   Take 1 tablet (40 mg total) by mouth 2 (two) times daily before a meal.   60 tablet   5   . potassium chloride SA (KLOR-CON M20) 20 MEQ tablet   Oral   Take 4 tablets (80 mEq total) by mouth 2 (two) times daily.   224 tablet   11   . warfarin (COUMADIN) 7.5 MG tablet   Oral   Take 7.5-11.25 mg by mouth daily. Takes 1 tablet daily, except 1.5 tab on Thursday          BP 157/92  Pulse 90  Temp(Src) 98.7 F (37.1 C) (Oral)  Resp 20  Ht 6' (1.829 m)  Wt 264 lb 5 oz (119.891 kg)  BMI 35.84 kg/m2  SpO2 97% Physical Exam  Nursing note and vitals reviewed. Constitutional: He is oriented to person, place, and time. He appears well-developed and well-nourished. No distress.  HENT:  Head: Normocephalic and atraumatic.  Mouth/Throat: Oropharynx is clear and moist.  Eyes: Conjunctivae and EOM are normal. Pupils are equal, round, and reactive to light.  Neck: Normal  range of motion.  Neck supple.  Cardiovascular: Normal rate, regular rhythm and intact distal pulses.   No murmur heard. Pulmonary/Chest: Effort normal. No respiratory distress. He has wheezes. He has no rales.  Abdominal: Soft. He exhibits no distension. There is no tenderness. There is no rebound and no guarding.  Musculoskeletal: Normal range of motion. He exhibits no edema and no tenderness.  Neurological: He is alert and oriented to person, place, and time.  Skin: Skin is warm and dry. No rash noted. No erythema.  Psychiatric: He has a normal mood and affect. His behavior is normal.    ED Course  Procedures (including critical care time) Labs Review Labs Reviewed  CBC WITH DIFFERENTIAL - Abnormal; Notable for the following:    Eosinophils Relative 7 (*)    All other components within normal limits  COMPREHENSIVE METABOLIC PANEL - Abnormal; Notable for the following:    Alkaline Phosphatase 131 (*)    GFR calc non Af Amer 74 (*)    GFR calc Af Amer 86 (*)    All other components within normal limits  PROTIME-INR - Abnormal; Notable for the following:    Prothrombin Time 24.7 (*)    INR 2.32 (*)    All other components within normal limits  CLOSTRIDIUM DIFFICILE BY PCR  URINALYSIS, ROUTINE W REFLEX MICROSCOPIC  GI PATHOGEN PANEL BY PCR, STOOL   Imaging Review No results found.  EKG Interpretation   None       MDM   1. Diarrhea     Pt presents with persistent diarrhea for 5 days >8 episodes daily and worse when trying to eat or drink so has stopped eating and drinking.  abd cramps but no localized pain.  Has been on antibiotics (clindamycin) in the last month but no travel or bad food exposure.  Concern for possible c.diff vs other infectious diarrhea vs non-infectious cause.  No N/V or fever.  Pt takes narcotics chronically but takes them approriately and has not run out concerning for withdrawal.  Pt is wheezing on exam so will get CXR but no prior hx of pulm issues and no signs of  fluid overload on exam.  Pt denies CP or SOB  Stool samples pending.  CBC wnl.  CMP pending.  Pt given IVF.    7:15 PM Labs are wnl.  Pt was able to produce a stool sample and afterwards was having abd cramping.  Pt treated with flagyl for posssible c-diff.  Also given gi follow up.  Gwyneth Sprout, MD 10/25/13 [phone]

## 2013-10-26 ENCOUNTER — Telehealth (HOSPITAL_COMMUNITY): Payer: Self-pay

## 2013-10-26 ENCOUNTER — Telehealth: Payer: Self-pay | Admitting: Internal Medicine

## 2013-10-26 ENCOUNTER — Telehealth: Payer: Self-pay | Admitting: *Deleted

## 2013-10-26 LAB — GI PATHOGEN PANEL BY PCR, STOOL
C difficile toxin A/B: POSITIVE
E coli (ETEC) LT/ST: NEGATIVE
E coli (STEC): NEGATIVE
E coli 0157 by PCR: NEGATIVE
G lamblia by PCR: NEGATIVE
Norovirus GI/GII: NEGATIVE
Salmonella by PCR: NEGATIVE
Shigella by PCR: NEGATIVE

## 2013-10-26 NOTE — Telephone Encounter (Signed)
10/26/2013  Pt was at hospital 10/25/2013 and had a stool sample done.  Physicians at hospital told pt to contact PCP about results and getting a referral to GI as soon as possible.  Please advise.

## 2013-10-26 NOTE — Telephone Encounter (Signed)
Spoke with pt advised of MDs result note.  He states he was prescribed Flagyl.

## 2013-10-26 NOTE — Telephone Encounter (Signed)
His stool test was positive for C diff infection and he is being treated, right? Why does he need to see GI now?

## 2013-10-26 NOTE — Telephone Encounter (Signed)
[facility] Triage Call Report Triage Record Num: [id number] Operator: Frederico Hamman Patient Name: Voris Tigert Call Date & Time: 10/24/2013 9:30:33PM Patient Phone: [phone] PCP: [name] Patient Gender: Male PCP Fax : Patient DOB: [birth year] Practice Name: [facility] Reason for Call: Tatem states he had onset of diarrhea on 10/21/13. C/o abdominal cramping - rates pain an 8 on 1/10 pt scale. States it feels like" his stomach is boiling like a pot on the stove." Incontinent of stool at times. Dizzy at times on 10/23/13. Taking Lomotil without releif. Has had 3 to 4 stools today- brown to red colored stools. States sometines stool "looks like a gel." Takes Coumadin daily. Unable to sleep more than 1.5 hours per night since 11/12. States he has diarrhea after he takes his meds. Unable to eat. Per gastrointestinal bleeding has ED disposition due to bloody diarrhea which has not been evaluated. States he was seen in office 4 weeks ago and was given Lomotil. Advised to be seen in ED- [facility]. Care advice given. Protocol(s) Used: Diarrhea or Other Change in Bowel Habits Protocol(s) Used: Gastrointestinal Bleeding Recommended Outcome per Protocol: See ED Immediately Reason for Outcome: Rectal bleeding (blood in or on the stool, more than scant; black tarry stools) Bloody diarrhea AND has not been evaluated Care Advice: ~ Another adult should drive. Call EMS 911 if signs and symptoms of shock develop (such as unable to stand due to faintness, dizziness, or lightheadedness; new onset of confusion; slow to respond or difficult to awaken; skin is pale, gray, cool, or moist to touch; severe weakness; loss of consciousness). ~ ~ Take sips of water or suck on ice chips as tolerated. Do not eat solid food until seen by provider. ~ IMMEDIATE ACTION 10/24/2013 10:05:40PM Page 1 of 1 [id number]

## 2013-10-26 NOTE — ED Notes (Signed)
Lab called with positive c-diff results.

## 2013-10-27 NOTE — ED Notes (Signed)
Patient informed of positive results after id'd x 2 and informed to follow up with PCP if needed.

## 2013-10-28 ENCOUNTER — Encounter
Payer: BC Managed Care – PPO | Attending: Physical Medicine & Rehabilitation | Admitting: Physical Medicine & Rehabilitation

## 2013-10-28 ENCOUNTER — Encounter: Payer: Self-pay | Admitting: Physical Medicine & Rehabilitation

## 2013-10-28 VITALS — BP 167/106 | HR 81 | Resp 16 | Ht 71.0 in | Wt 266.0 lb

## 2013-10-28 DIAGNOSIS — M1711 Unilateral primary osteoarthritis, right knee: Secondary | ICD-10-CM

## 2013-10-28 DIAGNOSIS — M1712 Unilateral primary osteoarthritis, left knee: Secondary | ICD-10-CM

## 2013-10-28 DIAGNOSIS — IMO0002 Reserved for concepts with insufficient information to code with codable children: Secondary | ICD-10-CM | POA: Insufficient documentation

## 2013-10-28 DIAGNOSIS — M1710 Unilateral primary osteoarthritis, unspecified knee: Secondary | ICD-10-CM

## 2013-10-28 MED ORDER — OXYCODONE HCL 10 MG PO TABS: 10.0000 mg | ORAL_TABLET | Freq: Three times a day (TID) | ORAL | Status: DC | PRN

## 2013-10-28 MED ORDER — OXYCODONE HCL ER 40 MG PO T12A: 40.0000 mg | EXTENDED_RELEASE_TABLET | Freq: Two times a day (BID) | ORAL | Status: DC

## 2013-10-28 NOTE — Progress Notes (Signed)
Subjective:    Patient ID: Tony Davis, male    DOB: August 14, 1961, 52 y.o.   MRN: [id number]  HPI  Tony Davis is back regarding his chronic knee pain. He had some issue in regard to a cellulitis for which he was hospitalized. He also contracted C diff from all of the abx he received. He was in the ED Sunday where he was dxed. He was started on flagyl but is still having symptoms of diarrhea and abdominal cramping.  He feels that he's tolerated the oxycontin better but still prefers the higher dose of oxy IR as he can "feel when it works."      Pain Inventory Average Pain 7 Pain Right Now 8 My pain is n/a  In the last 24 hours, has pain interfered with the following? General activity n/a Relation with others n/a Enjoyment of life n/a What TIME of day is your pain at its worst? constant Sleep (in general) Fair  Pain is worse with: walking, bending, sitting, inactivity and standing Pain improves with: rest, heat/ice and medication Relief from Meds: 6  Mobility use a cane  Function not employed: date last employed .  Neuro/Psych trouble walking  Prior Studies Any changes since last visit?  no  Physicians involved in your care Any changes since last visit?  no   Family History  Problem Relation Age of Onset  . Cancer Neg Hx   . Kidney disease Neg Hx   . Diabetes Neg Hx    History   Social History  . Marital Status: Married    Spouse Name: N/A    Number of Children: N/A  . Years of Education: N/A   Social History Main Topics  . Smoking status: Former Smoker -- 1.00 packs/day for 35 years    Types: Cigarettes    Quit date: 03/10/2012  . Smokeless tobacco: Never Used  . Alcohol Use: No  . Drug Use: No  . Sexual Activity: Yes   Other Topics Concern  . None   Social History Narrative   Married, 5 kids; lives in [place]. 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    Past Surgical History  Procedure Laterality Date  . Knee arthroscopy  Bilateral 2000's    2 on left and 3 on rt  . Tee without cardioversion  03/21/2012    Procedure: TRANSESOPHAGEAL ECHOCARDIOGRAM (TEE);  Surgeon: Pricilla Riffle, MD;  Location: [name] ENDOSCOPY;  Service: Cardiovascular;  Laterality: N/A;  . Coronary angioplasty with stent placement  2012    "1" (09/24/2013)   Past Medical History  Diagnosis Date  . GERD (gastroesophageal reflux disease)   . HLD (hyperlipidemia)   . HTN (hypertension)   . Hematuria 8/12  . PFO (patent foramen ovale)     echo 8/12: EF 55-65%, grade 2 diast dysfnx; +PFO on bubble study;  TEE 4/13: EF normal, atrial septum with suspicion for interatrial septum fenestrations without flow across and few large bubbles noted in LA.  This was not felt to require closure   . CAD (coronary artery disease)     a. s/p promus DES circumflex artery 10/13/10. b. Cath 04/2013: stable disease but possible small vessel disease -Imdur added.  Marland Kitchen DVT (deep venous thrombosis) 2013    "right"   . TIA (transient ischemic attack)     "1-2; both after the one in 02/2010" (09/25/2013)  . OSA on CPAP   . Type II diabetes mellitus     June  2014 AIC 6.2%/notes 09/24/2013; pt denies this hx on 09/25/2013  . Arthritis     "knees" (09/25/2013)  . Rheumatoid arthritis(714.0)   . CVA (cerebral vascular accident) 02/2010    "lost peripheral vision when I had the stroke; some memory issues since" (09/25/2013)   BP 167/106  Pulse 81  Resp 16  Ht 5\' 11"  (1.803 m)  Wt 266 lb (120.657 kg)  BMI 37.12 kg/m2  SpO2 98%     Review of Systems  Musculoskeletal: Positive for gait problem.  All other systems reviewed and are negative.       Objective:   Physical Exam  General: Alert and oriented x 3, No apparent distress  HEENT: Head is normocephalic, atraumatic, PERRLA, EOMI, sclera anicteric, oral mucosa pink and moist, dentition intact, ext ear canals clear,  Neck: Supple without JVD or lymphadenopathy  Heart: Reg rate and rhythm. No murmurs rubs or  gallops  Chest: CTA bilaterally without wheezes, rales, or rhonchi; no distress  Abdomen: Soft, non-tender, non-distended, bowel sounds positive.  Extremities: No clubbing, cyanosis, or edema. Pulses are 2+  Skin: Clean and intact without signs of breakdown  Neuro: Pt is cognitively appropriate with normal insight, memory, and awareness. Cranial nerves 2-12 are intact. Sensory exam is normal. Reflexes are 2+ in all 4's. Fine motor coordination is intact. No tremors. Motor function is grossly 5/5.  Musculoskeletal: Both legs remain very tender to touch. Effusions present around both knees. He has difficulty extending either knee agst gravity. He's antalgic on either leg with gait although he is fairly stable on his feet with his cane. Marland Kitchen  Psych: Pt's affect is appropriate. Pt is cooperative. He is quite pleasant     1. Bilateral knee pain d/t endstage OA. He is in needs of knee replacements ultimately  -continue voltaren gel  -oxycontin CR--increase to 40mg  for better relief   -may continue oxy ir 10mg  q8 prn for breakthrough pain  -again reviewed the drug mechanisms, loa, etc 2. CAD  3. C diff---follow up with PCP/GI I'll see him back in about a month. 15 minutes of face to face patient care time were spent during this visit. All questions were encouraged and answered.

## 2013-10-28 NOTE — Patient Instructions (Signed)
CALL ME WITH ANY PROBLEMS OR QUESTIONS (#[phone]).  HAVE A GOOD DAY  

## 2013-11-02 ENCOUNTER — Other Ambulatory Visit: Payer: Self-pay | Admitting: Cardiovascular Disease

## 2013-11-17 ENCOUNTER — Ambulatory Visit (INDEPENDENT_AMBULATORY_CARE_PROVIDER_SITE_OTHER): Payer: BC Managed Care – PPO | Admitting: Pharmacist

## 2013-11-17 DIAGNOSIS — I82409 Acute embolism and thrombosis of unspecified deep veins of unspecified lower extremity: Secondary | ICD-10-CM

## 2013-11-17 DIAGNOSIS — Z7901 Long term (current) use of anticoagulants: Secondary | ICD-10-CM

## 2013-11-17 DIAGNOSIS — I6350 Cerebral infarction due to unspecified occlusion or stenosis of unspecified cerebral artery: Secondary | ICD-10-CM

## 2013-11-17 LAB — POCT INR: INR: 2.9

## 2013-11-24 ENCOUNTER — Other Ambulatory Visit: Payer: Self-pay | Admitting: *Deleted

## 2013-11-24 DIAGNOSIS — M1711 Unilateral primary osteoarthritis, right knee: Secondary | ICD-10-CM

## 2013-11-24 DIAGNOSIS — M1712 Unilateral primary osteoarthritis, left knee: Secondary | ICD-10-CM

## 2013-11-24 MED ORDER — OXYCODONE HCL 10 MG PO TABS: 10.0000 mg | ORAL_TABLET | Freq: Three times a day (TID) | ORAL | Status: DC | PRN

## 2013-11-24 MED ORDER — OXYCODONE HCL ER 40 MG PO T12A: 40.0000 mg | EXTENDED_RELEASE_TABLET | Freq: Two times a day (BID) | ORAL | Status: DC

## 2013-11-24 NOTE — Telephone Encounter (Signed)
RX printed early for controlled medication for the visit with RN on 11/27/13 (to be signed by MD) 

## 2013-11-27 ENCOUNTER — Encounter: Payer: BC Managed Care – PPO | Attending: Physical Medicine & Rehabilitation | Admitting: *Deleted

## 2013-11-27 ENCOUNTER — Encounter: Payer: Self-pay | Admitting: *Deleted

## 2013-11-27 VITALS — BP 142/92 | HR 87 | Resp 14

## 2013-11-27 DIAGNOSIS — M1712 Unilateral primary osteoarthritis, left knee: Secondary | ICD-10-CM

## 2013-11-27 DIAGNOSIS — M1710 Unilateral primary osteoarthritis, unspecified knee: Secondary | ICD-10-CM | POA: Diagnosis present

## 2013-11-27 DIAGNOSIS — IMO0002 Reserved for concepts with insufficient information to code with codable children: Secondary | ICD-10-CM | POA: Diagnosis not present

## 2013-11-27 DIAGNOSIS — M1711 Unilateral primary osteoarthritis, right knee: Secondary | ICD-10-CM

## 2013-11-27 NOTE — Progress Notes (Addendum)
Here for pill count and medication refills. Oxycontin 20 mg Fill date 10/28/13 # 60  Today NV# 0 appropriate 10/28/13 # 75  Today NV# 0 appropriate  Oxycodone 10 mg.  Follow up in one month with RN for med refill and pill count.

## 2013-11-27 NOTE — Patient Instructions (Signed)
Follow up one month with RN for pill count and med refill 

## 2013-12-04 ENCOUNTER — Other Ambulatory Visit: Payer: Self-pay | Admitting: Cardiovascular Disease

## 2013-12-10 DIAGNOSIS — J9600 Acute respiratory failure, unspecified whether with hypoxia or hypercapnia: Secondary | ICD-10-CM

## 2013-12-10 DIAGNOSIS — I610 Nontraumatic intracerebral hemorrhage in hemisphere, subcortical: Secondary | ICD-10-CM

## 2013-12-10 DIAGNOSIS — Z931 Gastrostomy status: Secondary | ICD-10-CM

## 2013-12-10 DIAGNOSIS — J1569 Pneumonia due to other gram-negative bacteria: Secondary | ICD-10-CM

## 2013-12-10 DIAGNOSIS — J156 Pneumonia due to other aerobic Gram-negative bacteria: Secondary | ICD-10-CM

## 2013-12-10 DIAGNOSIS — G919 Hydrocephalus, unspecified: Secondary | ICD-10-CM

## 2013-12-10 HISTORY — DX: Pneumonia due to other gram-negative bacteria: J15.6

## 2013-12-10 HISTORY — DX: Pneumonia due to other gram-negative bacteria: J15.69

## 2013-12-10 HISTORY — DX: Acute respiratory failure, unspecified whether with hypoxia or hypercapnia: J96.00

## 2013-12-10 HISTORY — DX: Gastrostomy status: Z93.1

## 2013-12-10 HISTORY — DX: Nontraumatic intracerebral hemorrhage in hemisphere, subcortical: I61.0

## 2013-12-10 HISTORY — DX: Hydrocephalus, unspecified: G91.9

## 2013-12-11 ENCOUNTER — Other Ambulatory Visit: Payer: Self-pay | Admitting: Cardiology

## 2013-12-18 ENCOUNTER — Encounter: Payer: Self-pay | Admitting: Internal Medicine

## 2013-12-18 ENCOUNTER — Ambulatory Visit (INDEPENDENT_AMBULATORY_CARE_PROVIDER_SITE_OTHER): Payer: Medicare Other | Admitting: Internal Medicine

## 2013-12-18 ENCOUNTER — Ambulatory Visit (INDEPENDENT_AMBULATORY_CARE_PROVIDER_SITE_OTHER)
Admission: RE | Admit: 2013-12-18 | Discharge: 2013-12-18 | Disposition: A | Discharge status: Home / Self Care | Payer: Medicare Other | Source: Ambulatory Visit | Attending: Internal Medicine | Admitting: Internal Medicine

## 2013-12-18 VITALS — BP 132/96 | HR 99 | Temp 99.7°F | Resp 16 | Ht 71.0 in | Wt 267.0 lb

## 2013-12-18 DIAGNOSIS — R059 Cough, unspecified: Secondary | ICD-10-CM

## 2013-12-18 DIAGNOSIS — R079 Chest pain, unspecified: Secondary | ICD-10-CM | POA: Diagnosis not present

## 2013-12-18 DIAGNOSIS — J45909 Unspecified asthma, uncomplicated: Secondary | ICD-10-CM | POA: Diagnosis not present

## 2013-12-18 DIAGNOSIS — R05 Cough: Secondary | ICD-10-CM

## 2013-12-18 MED ORDER — IPRATROPIUM-ALBUTEROL 0.5-2.5 (3) MG/3ML IN SOLN
3.0000 mL | Freq: Once | RESPIRATORY_TRACT | Status: AC
  Administered 2013-12-18: 3 mL via RESPIRATORY_TRACT

## 2013-12-18 MED ORDER — IPRATROPIUM-ALBUTEROL 20-100 MCG/ACT IN AERS: 1.0000 | INHALATION_SPRAY | Freq: Four times a day (QID) | RESPIRATORY_TRACT | Status: DC

## 2013-12-18 MED ORDER — HYDROCODONE-HOMATROPINE 5-1.5 MG/5ML PO SYRP: 5.0000 mL | ORAL_SOLUTION | Freq: Three times a day (TID) | ORAL | Status: DC | PRN

## 2013-12-18 MED ORDER — METHYLPREDNISOLONE ACETATE 80 MG/ML IJ SUSP
120.0000 mg | Freq: Once | INTRAMUSCULAR | Status: AC
  Administered 2013-12-18: 120 mg via INTRAMUSCULAR

## 2013-12-18 MED ORDER — MOXIFLOXACIN HCL 400 MG PO TABS: 400.0000 mg | ORAL_TABLET | Freq: Every day | ORAL | Status: DC

## 2013-12-18 NOTE — Assessment & Plan Note (Signed)
I will treat the allergic component with an injection of depo-medrol IM and combivent inh He will take avelox for the infection and a cough suppressant for symptom relief

## 2013-12-18 NOTE — Progress Notes (Signed)
Subjective:    Patient ID: Tony Davis, male    DOB: 01/07/1961, 53 y.o.   MRN: [id number]  Cough This is a new problem. Episode onset: 5 days ago. The problem has been gradually worsening. The problem occurs every few hours. The cough is productive of purulent sputum. Associated symptoms include chills, a fever, myalgias, a sore throat, shortness of breath, sweats and wheezing. Pertinent negatives include no chest pain, ear congestion, ear pain, headaches, heartburn, hemoptysis, nasal congestion, postnasal drip, rash, rhinorrhea or weight loss. The symptoms are aggravated by cold air. He has tried OTC cough suppressant for the symptoms. The treatment provided no relief. There is no history of asthma, bronchiectasis, bronchitis, COPD, emphysema, environmental allergies or pneumonia.      Review of Systems  Constitutional: Positive for fever and chills. Negative for weight loss, diaphoresis, activity change, appetite change, fatigue and unexpected weight change.  HENT: Positive for sore throat. Negative for congestion, ear pain, postnasal drip, rhinorrhea, sinus pressure, tinnitus, trouble swallowing and voice change.   Eyes: Negative.   Respiratory: Positive for cough, shortness of breath and wheezing. Negative for apnea, hemoptysis, choking, chest tightness and stridor.   Cardiovascular: Negative.  Negative for chest pain.  Gastrointestinal: Negative.  Negative for heartburn.  Endocrine: Negative.   Genitourinary: Negative.   Musculoskeletal: Positive for myalgias.  Skin: Negative for rash.  Allergic/Immunologic: Negative.  Negative for environmental allergies.  Neurological: Negative.  Negative for dizziness, tremors, seizures, speech difficulty, weakness, light-headedness and headaches.  Hematological: Negative.  Negative for adenopathy. Does not bruise/bleed easily.  Psychiatric/Behavioral: Negative.        Objective:   Physical Exam  Vitals reviewed. Constitutional: He is  oriented to person, place, and time. He appears well-developed and well-nourished.  Non-toxic appearance. He does not have a sickly appearance. He appears ill. No distress.  HENT:  Head: Normocephalic and atraumatic.  Mouth/Throat: Oropharynx is clear and moist. No oropharyngeal exudate.  Eyes: Conjunctivae are normal. Right eye exhibits no discharge. Left eye exhibits no discharge. No scleral icterus.  Neck: Normal range of motion. Neck supple. No JVD present. No tracheal deviation present. No thyromegaly present.  Cardiovascular: Normal rate, regular rhythm, normal heart sounds and intact distal pulses.  Exam reveals no gallop and no friction rub.   No murmur heard. Pulmonary/Chest: Effort normal. No accessory muscle usage or stridor. Not tachypneic. No respiratory distress. He has no decreased breath sounds. He has wheezes in the right middle field, the right lower field, the left middle field and the left lower field. He has rhonchi in the right middle field, the right lower field and the left lower field. He has no rales. He exhibits no tenderness.  He received a jet neb of albuterol/atrovent and after that I recheck his lungs and there are very few rhonchi but no wheezes and very good air movement.  Abdominal: Soft. Bowel sounds are normal. He exhibits no distension and no mass. There is no tenderness. There is no rebound and no guarding.  Musculoskeletal: Normal range of motion. He exhibits no edema and no tenderness.  Lymphadenopathy:    He has no cervical adenopathy.  Neurological: He is oriented to person, place, and time.  Skin: Skin is warm and dry. No rash noted. He is not diaphoretic. No erythema. No pallor.  Psychiatric: He has a normal mood and affect. His behavior is normal. Judgment and thought content normal.     Lab Results  Component Value Date   WBC 8.1  10/25/2013   HGB 15.2 10/25/2013   HCT 43.9 10/25/2013   PLT 288 10/25/2013   GLUCOSE 98 10/25/2013   CHOL 240*  04/23/2013   TRIG 170* 04/23/2013   HDL 27* 04/23/2013   LDLDIRECT 154.3 03/06/2013   LDLCALC 179* 04/23/2013   ALT 27 10/25/2013   AST 24 10/25/2013   NA 140 10/25/2013   K 3.5 10/25/2013   CL 103 10/25/2013   CREATININE 1.14 10/25/2013   BUN 14 10/25/2013   CO2 23 10/25/2013   TSH 1.693 04/22/2013   PSA 0.38 08/06/2011   INR 2.9 11/17/2013   HGBA1C 6.9* 09/24/2013       Assessment & Plan:

## 2013-12-18 NOTE — Assessment & Plan Note (Signed)
I will check his CXR to see if there is PNA

## 2013-12-18 NOTE — Patient Instructions (Signed)
Acute Bronchitis Bronchitis is inflammation of the airways that extend from the windpipe into the lungs (bronchi). The inflammation often causes mucus to develop. This leads to a cough, which is the most common symptom of bronchitis.  In acute bronchitis, the condition usually develops suddenly and goes away over time, usually in a couple weeks. Smoking, allergies, and asthma can make bronchitis worse. Repeated episodes of bronchitis may cause further lung problems.  CAUSES Acute bronchitis is most often caused by the same virus that causes a cold. The virus can spread from person to person (contagious).  SIGNS AND SYMPTOMS   Cough.   Fever.   Coughing up mucus.   Body aches.   Chest congestion.   Chills.   Shortness of breath.   Sore throat.  DIAGNOSIS  Acute bronchitis is usually diagnosed through a physical exam. Tests, such as chest X-rays, are sometimes done to rule out other conditions.  TREATMENT  Acute bronchitis usually goes away in a couple weeks. Often times, no medical treatment is necessary. Medicines are sometimes given for relief of fever or cough. Antibiotics are usually not needed but may be prescribed in certain situations. In some cases, an inhaler may be recommended to help reduce shortness of breath and control the cough. A cool mist vaporizer may also be used to help thin bronchial secretions and make it easier to clear the chest.  HOME CARE INSTRUCTIONS  Get plenty of rest.   Drink enough fluids to keep your urine clear or pale yellow (unless you have a medical condition that requires fluid restriction). Increasing fluids may help thin your secretions and will prevent dehydration.   Only take over-the-counter or prescription medicines as directed by your health care provider.   Avoid smoking and secondhand smoke. Exposure to cigarette smoke or irritating chemicals will make bronchitis worse. If you are a smoker, consider using nicotine gum or skin  patches to help control withdrawal symptoms. Quitting smoking will help your lungs heal faster.   Reduce the chances of another bout of acute bronchitis by washing your hands frequently, avoiding people with cold symptoms, and trying not to touch your hands to your mouth, nose, or eyes.   Follow up with your health care provider as directed.  SEEK MEDICAL CARE IF: Your symptoms do not improve after 1 week of treatment.  SEEK IMMEDIATE MEDICAL CARE IF:  You develop an increased fever or chills.   You have chest pain.   You have severe shortness of breath.  You have bloody sputum.   You develop dehydration.  You develop fainting.  You develop repeated vomiting.  You develop a severe headache. MAKE SURE YOU:   Understand these instructions.  Will watch your condition.  Will get help right away if you are not doing well or get worse. Document Released: 01/03/2005 Document Revised: 07/29/2013 Document Reviewed: 05/19/2013 [employer] Patient Information 2014 [employer].  

## 2013-12-21 ENCOUNTER — Telehealth: Payer: Self-pay | Admitting: *Deleted

## 2013-12-21 NOTE — Telephone Encounter (Signed)
[facility] Triage Call Report Triage Record Num: [id number] Operator: April Gaither Patient Name: Tony Davis Call Date & Time: 12/18/2013 5:55:12PM Patient Phone: [phone] PCP: [name] Patient Gender: Male PCP Fax : Patient DOB: 09-16-1961 Practice Name: [facility] Reason for Call: Caller: Colletta [place]; PCP: [name] (Adults only); CB#: [phone]; Call regarding Medication Issue; Medication(s): Avelox (medication interaction); Pharmacy calling to report increased bleeding risk for patient due to taking Avelox with Coumadin; Contacted Dr. Lorelei Pont who approves patient to continue to take but does recommend having his INR checked on Tuesday or Wednesday of next week; Pharmacist informed and will relay orders to patient. Protocol(s) Used: Office Note Recommended Outcome per Protocol: Information Noted and Sent to Office Reason for Outcome: Caller information to office Care Advice: ~ 01/

## 2013-12-21 NOTE — Telephone Encounter (Signed)
Spoke with pt.  He started Avelox on 1/9.  Appt made to check INR on 1/13.

## 2013-12-22 ENCOUNTER — Ambulatory Visit (INDEPENDENT_AMBULATORY_CARE_PROVIDER_SITE_OTHER): Payer: BC Managed Care – PPO

## 2013-12-22 DIAGNOSIS — Z5181 Encounter for therapeutic drug level monitoring: Secondary | ICD-10-CM | POA: Diagnosis not present

## 2013-12-22 DIAGNOSIS — I6350 Cerebral infarction due to unspecified occlusion or stenosis of unspecified cerebral artery: Secondary | ICD-10-CM

## 2013-12-22 DIAGNOSIS — I82409 Acute embolism and thrombosis of unspecified deep veins of unspecified lower extremity: Secondary | ICD-10-CM

## 2013-12-22 DIAGNOSIS — Z7901 Long term (current) use of anticoagulants: Secondary | ICD-10-CM | POA: Diagnosis not present

## 2013-12-22 LAB — POCT INR: INR: 3.5

## 2013-12-24 ENCOUNTER — Other Ambulatory Visit: Payer: Self-pay | Admitting: *Deleted

## 2013-12-24 DIAGNOSIS — M1711 Unilateral primary osteoarthritis, right knee: Secondary | ICD-10-CM

## 2013-12-24 DIAGNOSIS — M1712 Unilateral primary osteoarthritis, left knee: Secondary | ICD-10-CM

## 2013-12-24 MED ORDER — OXYCODONE HCL 10 MG PO TABS: 10.0000 mg | ORAL_TABLET | Freq: Three times a day (TID) | ORAL | Status: DC | PRN

## 2013-12-24 MED ORDER — OXYCODONE HCL ER 40 MG PO T12A: 40.0000 mg | EXTENDED_RELEASE_TABLET | Freq: Two times a day (BID) | ORAL | Status: DC

## 2013-12-24 NOTE — Telephone Encounter (Signed)
RX printed early for controlled medication for the visit with RN on 12/28/13 (to be signed by MD) 

## 2013-12-28 ENCOUNTER — Encounter: Payer: BC Managed Care – PPO | Attending: Physical Medicine & Rehabilitation | Admitting: *Deleted

## 2013-12-28 ENCOUNTER — Encounter: Payer: Self-pay | Admitting: *Deleted

## 2013-12-28 VITALS — BP 152/91 | HR 85 | Resp 14 | Wt 266.0 lb

## 2013-12-28 DIAGNOSIS — I2510 Atherosclerotic heart disease of native coronary artery without angina pectoris: Secondary | ICD-10-CM | POA: Diagnosis not present

## 2013-12-28 DIAGNOSIS — A0472 Enterocolitis due to Clostridium difficile, not specified as recurrent: Secondary | ICD-10-CM | POA: Insufficient documentation

## 2013-12-28 DIAGNOSIS — M179 Osteoarthritis of knee, unspecified: Secondary | ICD-10-CM

## 2013-12-28 DIAGNOSIS — M1711 Unilateral primary osteoarthritis, right knee: Secondary | ICD-10-CM

## 2013-12-28 DIAGNOSIS — Z79899 Other long term (current) drug therapy: Secondary | ICD-10-CM | POA: Diagnosis not present

## 2013-12-28 DIAGNOSIS — M1712 Unilateral primary osteoarthritis, left knee: Secondary | ICD-10-CM

## 2013-12-28 DIAGNOSIS — M1710 Unilateral primary osteoarthritis, unspecified knee: Secondary | ICD-10-CM | POA: Insufficient documentation

## 2013-12-28 NOTE — Progress Notes (Signed)
Here for pill count and medication refills  Oxycontin 40 mg # 60.  Fill date 11/27/13   Today NV#0  Oxycodone 10 mg #75 Fill date 11/27/13  Today nv# 0  Pill counts are appropriate though Mr Klemz is complaining about needing higher doses of the IR oxycodone because he can't "feel it".  He was talking about the cost and how the pharmacist told him he can get higher doses for the same cost.  I explained to him that the cost is not the issue. It doesn't matter that he can afford higher dose for same cost. He cannot just get higher doses because he wants to be able to feel it.  Dr Naaman Plummer has increased his oxycontin from 20 mg bid to 40 mg bid but he is not satisfied. Naaman Plummer has said he will not incresee the IR dose.   He also is wanting to get a "permanent handicap license plate" from the dmv because he has lost several of his handicap placards. I asked how if they stay in his car on the mirror like they are supposed to be displayed. He says that is how they are getting lost. That is not making much sense.  I discussed the hydrocodone cough syrup he got from Dr [name].  He is under contract with our clinic and is not supposed to be getting narcotics from another physician and even though it was for cough, we have signs posted around the office and in exam rooms that this is a narcotic and could result in discharge if ok is not given from his doctor in this office. .  I offerered to given him a copy of the posted signs but he declined.   I did alert Dr Naaman Plummer that he had received the hydrocodone from Dr [name] and he okd it but in future he must have permission if he is to continue getting his pain meds through this office. He said he understood.  Follow up with Dr Naaman Plummer next month.

## 2013-12-28 NOTE — Patient Instructions (Signed)
Follow up with Dr Swartz 

## 2014-01-13 ENCOUNTER — Other Ambulatory Visit: Payer: Self-pay | Admitting: Internal Medicine

## 2014-01-18 ENCOUNTER — Other Ambulatory Visit: Payer: Self-pay

## 2014-01-18 MED ORDER — ATORVASTATIN CALCIUM 80 MG PO TABS: 80.0000 mg | ORAL_TABLET | Freq: Every day | ORAL | Status: DC

## 2014-01-19 ENCOUNTER — Ambulatory Visit (INDEPENDENT_AMBULATORY_CARE_PROVIDER_SITE_OTHER): Payer: Medicare Other | Admitting: Pharmacist

## 2014-01-19 DIAGNOSIS — I82409 Acute embolism and thrombosis of unspecified deep veins of unspecified lower extremity: Secondary | ICD-10-CM | POA: Diagnosis not present

## 2014-01-19 DIAGNOSIS — Z7901 Long term (current) use of anticoagulants: Secondary | ICD-10-CM | POA: Diagnosis not present

## 2014-01-19 DIAGNOSIS — I6350 Cerebral infarction due to unspecified occlusion or stenosis of unspecified cerebral artery: Secondary | ICD-10-CM | POA: Diagnosis not present

## 2014-01-19 LAB — POCT INR: INR: 3.8

## 2014-01-28 ENCOUNTER — Telehealth: Payer: Self-pay | Admitting: Physical Medicine & Rehabilitation

## 2014-01-28 ENCOUNTER — Encounter
Payer: Medicare Other | Attending: Physical Medicine and Rehabilitation | Admitting: Physical Medicine and Rehabilitation

## 2014-01-28 ENCOUNTER — Encounter: Payer: Self-pay | Admitting: Internal Medicine

## 2014-01-28 ENCOUNTER — Telehealth: Payer: Self-pay

## 2014-01-28 ENCOUNTER — Ambulatory Visit (INDEPENDENT_AMBULATORY_CARE_PROVIDER_SITE_OTHER): Payer: Medicare Other | Admitting: Internal Medicine

## 2014-01-28 ENCOUNTER — Encounter: Payer: Self-pay | Admitting: Physical Medicine and Rehabilitation

## 2014-01-28 VITALS — BP 180/117 | HR 78 | Resp 14 | Ht 72.0 in | Wt 267.0 lb

## 2014-01-28 VITALS — BP 142/100 | HR 86 | Temp 98.1°F | Wt 268.0 lb

## 2014-01-28 DIAGNOSIS — I2510 Atherosclerotic heart disease of native coronary artery without angina pectoris: Secondary | ICD-10-CM | POA: Insufficient documentation

## 2014-01-28 DIAGNOSIS — Q211 Atrial septal defect: Secondary | ICD-10-CM | POA: Diagnosis not present

## 2014-01-28 DIAGNOSIS — M069 Rheumatoid arthritis, unspecified: Secondary | ICD-10-CM | POA: Diagnosis not present

## 2014-01-28 DIAGNOSIS — IMO0001 Reserved for inherently not codable concepts without codable children: Secondary | ICD-10-CM | POA: Diagnosis not present

## 2014-01-28 DIAGNOSIS — E1165 Type 2 diabetes mellitus with hyperglycemia: Secondary | ICD-10-CM

## 2014-01-28 DIAGNOSIS — I69959 Hemiplegia and hemiparesis following unspecified cerebrovascular disease affecting unspecified side: Secondary | ICD-10-CM

## 2014-01-28 DIAGNOSIS — Z86718 Personal history of other venous thrombosis and embolism: Secondary | ICD-10-CM | POA: Insufficient documentation

## 2014-01-28 DIAGNOSIS — Z87891 Personal history of nicotine dependence: Secondary | ICD-10-CM | POA: Insufficient documentation

## 2014-01-28 DIAGNOSIS — G4733 Obstructive sleep apnea (adult) (pediatric): Secondary | ICD-10-CM

## 2014-01-28 DIAGNOSIS — Z8673 Personal history of transient ischemic attack (TIA), and cerebral infarction without residual deficits: Secondary | ICD-10-CM | POA: Insufficient documentation

## 2014-01-28 DIAGNOSIS — Q2111 Secundum atrial septal defect: Secondary | ICD-10-CM | POA: Diagnosis not present

## 2014-01-28 DIAGNOSIS — M1710 Unilateral primary osteoarthritis, unspecified knee: Secondary | ICD-10-CM | POA: Insufficient documentation

## 2014-01-28 DIAGNOSIS — G8929 Other chronic pain: Secondary | ICD-10-CM | POA: Insufficient documentation

## 2014-01-28 DIAGNOSIS — I10 Essential (primary) hypertension: Secondary | ICD-10-CM

## 2014-01-28 DIAGNOSIS — E119 Type 2 diabetes mellitus without complications: Secondary | ICD-10-CM | POA: Insufficient documentation

## 2014-01-28 DIAGNOSIS — M25569 Pain in unspecified knee: Secondary | ICD-10-CM | POA: Insufficient documentation

## 2014-01-28 DIAGNOSIS — E785 Hyperlipidemia, unspecified: Secondary | ICD-10-CM | POA: Insufficient documentation

## 2014-01-28 DIAGNOSIS — M1712 Unilateral primary osteoarthritis, left knee: Secondary | ICD-10-CM

## 2014-01-28 DIAGNOSIS — K219 Gastro-esophageal reflux disease without esophagitis: Secondary | ICD-10-CM | POA: Diagnosis not present

## 2014-01-28 DIAGNOSIS — I6350 Cerebral infarction due to unspecified occlusion or stenosis of unspecified cerebral artery: Secondary | ICD-10-CM | POA: Diagnosis not present

## 2014-01-28 DIAGNOSIS — I69359 Hemiplegia and hemiparesis following cerebral infarction affecting unspecified side: Secondary | ICD-10-CM

## 2014-01-28 DIAGNOSIS — M1711 Unilateral primary osteoarthritis, right knee: Secondary | ICD-10-CM

## 2014-01-28 DIAGNOSIS — IMO0002 Reserved for concepts with insufficient information to code with codable children: Secondary | ICD-10-CM

## 2014-01-28 MED ORDER — OXYCODONE HCL 10 MG PO TABS: 10.0000 mg | ORAL_TABLET | Freq: Three times a day (TID) | ORAL | Status: DC | PRN

## 2014-01-28 MED ORDER — OXYCODONE HCL ER 40 MG PO T12A: 40.0000 mg | EXTENDED_RELEASE_TABLET | Freq: Two times a day (BID) | ORAL | Status: DC

## 2014-01-28 MED ORDER — CARVEDILOL 25 MG PO TABS: 25.0000 mg | ORAL_TABLET | Freq: Two times a day (BID) | ORAL | Status: DC

## 2014-01-28 MED ORDER — IRBESARTAN 300 MG PO TABS: 300.0000 mg | ORAL_TABLET | Freq: Every day | ORAL | Status: DC

## 2014-01-28 NOTE — Telephone Encounter (Signed)
Patient called back about medication prior authorization.  Spoke with pharmacy, or insurance, said that if you fax authorization request to number below, they will expedite it for him.    [phone]  Please call with any questions.

## 2014-01-28 NOTE — Assessment & Plan Note (Signed)
stable overall by history and exam, recent data reviewed with pt, and pt to continue medical treatment as before,  to f/u any worsening symptoms or concerns Lab Results  Component Value Date   HGBA1C 6.9* 09/24/2013

## 2014-01-28 NOTE — Telephone Encounter (Signed)
Yes, that would be true, but as the medications are in the same family, I am sure Dr Angelena Form would be ok with it, as this is done for other pt's as well

## 2014-01-28 NOTE — Progress Notes (Signed)
Subjective:    Patient ID: Tony Davis, male    DOB: 1961/08/01, 53 y.o.   MRN: [id number]  HPI Mr. Rodwell is back regarding his chronic knee pain. He see's [facility] for OA and was deemed non surgical due to multiple medical issues and chronic coumadin with stroke risk. He reports that his cardiologist feels that he may be stable for surgery in the near future and is thinking of have both knees done. His exercise regimen consists of walking 30 minutes 3-4 times a week and has HEP for quad strengthening.   We discussed his feelings that he is not getting the pain regimen he deserves. He would prefer Oxy IR 20 mg qid as opposed to oxycontin. IR will be cheaper and recommended by Insurance co. We discussed benefits of long acting v/s short acting, tolerance as well as appropriate patient care. Also discussed changes made in the last few months with titration upward. He perseverated on dosing and questioned protocols at other pain clinics.  He rates average pain as level 7/10 and gets 6/10 relief from pain medications.    Pain Inventory Average Pain 7 Pain Right Now 9 My pain is sharp and aching  In the last 24 hours, has pain interfered with the following? General activity 8 Relation with others 8 Enjoyment of life 8 What TIME of day is your pain at its worst? varies Sleep (in general) Fair  Pain is worse with: walking, bending, sitting, standing and some activites Pain improves with: rest, heat/ice and medication Relief from Meds: 6  Mobility walk with assistance use a cane  Function disabled: date disabled na  Neuro/Psych No problems in this area  Prior Studies Any changes since last visit?  no  Physicians involved in your care Any changes since last visit?  no   Family History  Problem Relation Age of Onset  . Cancer Neg Hx   . Kidney disease Neg Hx   . Diabetes Neg Hx    History   Social History  . Marital Status: Married    Spouse Name: N/A    Number  of Children: N/A  . Years of Education: N/A   Social History Main Topics  . Smoking status: Former Smoker -- 1.00 packs/day for 35 years    Types: Cigarettes    Quit date: 03/10/2012  . Smokeless tobacco: Never Used  . Alcohol Use: No  . Drug Use: No  . Sexual Activity: Yes   Other Topics Concern  . None   Social History Narrative   Married, 5 kids; lives in [place]. 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    Past Surgical History  Procedure Laterality Date  . Knee arthroscopy Bilateral 2000's    2 on left and 3 on rt  . Tee without cardioversion  03/21/2012    Procedure: TRANSESOPHAGEAL ECHOCARDIOGRAM (TEE);  Surgeon: Fay Records, MD;  Location: [facility] ENDOSCOPY;  Service: Cardiovascular;  Laterality: N/A;  . Coronary angioplasty with stent placement  2012    "1" (09/24/2013)   Past Medical History  Diagnosis Date  . GERD (gastroesophageal reflux disease)   . HLD (hyperlipidemia)   . HTN (hypertension)   . Hematuria 8/12  . PFO (patent foramen ovale)     echo 8/12: EF 55-65%, grade 2 diast dysfnx; +PFO on bubble study;  TEE 4/13: EF normal, atrial septum with suspicion for interatrial septum fenestrations without flow across and few large bubbles noted in LA.  This was not felt to require closure   . CAD (coronary artery disease)     a. s/p promus DES circumflex artery 10/13/10. b. Cath 04/2013: stable disease but possible small vessel disease -Imdur added.  Marland Kitchen DVT (deep venous thrombosis) 2013    "right"   . TIA (transient ischemic attack)     "1-2; both after the one in 02/2010" (09/25/2013)  . OSA on CPAP   . Type II diabetes mellitus     June 2014 AIC 6.2%/notes 09/24/2013; pt denies this hx on 09/25/2013  . Arthritis     "knees" (09/25/2013)  . Rheumatoid arthritis(714.0)   . CVA (cerebral vascular accident) 02/2010    "lost peripheral vision when I had the stroke; some memory issues since" (09/25/2013)   BP 180/117  Pulse 78  Resp 14  Ht 6' (1.829 m)   Wt 267 lb (121.11 kg)  BMI 36.20 kg/m2  SpO2 98%  Opioid Risk Score:   Fall Risk Score: Low Fall Risk (0-5 points) (pt educated on fall risk, declined brochure)    Review of Systems  All other systems reviewed and are negative.       Objective:   Physical Exam  Nursing note and vitals reviewed. Constitutional: He is oriented to person, place, and time. He appears well-developed and well-nourished.  HENT:  Head: Normocephalic and atraumatic.  Eyes: Conjunctivae are normal. Pupils are equal, round, and reactive to light.  Neck: Normal range of motion. Neck supple.  Cardiovascular: Normal rate and regular rhythm.   Pulmonary/Chest: Effort normal. No respiratory distress. He has rhonchi in the right upper field and the left upper field.  Abdominal: Soft. Bowel sounds are normal. He exhibits no distension.  Musculoskeletal:  Knee around both knees.  He is able to extend right knee better than left against gravity.  Left knee with crepitus with limited ROM. He gets out of chair with moderate difficulty and walks with antalgic gait bilaterally. Gait stable with use of cane.   Neurological: He is alert and oriented to person, place, and time.  Skin: Skin is warm and dry.          Assessment & Plan:  1. Endstage OA bilateral Knees:  He is not using voltaren gel anymore as he does not feel that it's effective. Uses ice and heat prn in addition to ROM exercises and mustard as home remedy for occasional cramps. Discussed trying opana again as he reported that morphine has helped with pain management during prior hospitalization but patient not interested in this regimen. Signed form for permanent handicap licence.  Refilled:  Oxycontin CR 40 mg # 60: Use one bid                Oxycodone IR 10 mg #  75: use one po every 8 hours as needed prn.  2. Malignant HTN:  Recheck blood pressure at end of visit 196/111. Patient reports mild HA but no other symptoms. He has not taken his BP meds  today and has been compliant with home regimen. Offered option of going to ED or MD office next door. Called and set patient up to see Dr. Jenny Reichmann this afternoon. He has a BP cuff at home and will recheck once home. Also advised to take meds, rest, plenty of fluids and follow up with MD this pm.   3. Pain management: Records were reviewed and patient informed that we have slowly been titrating meds as appropriate. He is welcome to get 2nd opinion  if he so desires. He will discuss this again with Dr. Naaman Plummer on next visit.

## 2014-01-28 NOTE — Progress Notes (Signed)
Subjective:    Patient ID: Tony Davis, male    DOB: [birth year], 53 y.o.   MRN: [id number]  HPI  Here to f/u, c/o worsening recent BP, specifically this am at home 193/111, better now here, has intermittent HA, sleeping in room as I walk in, not using his CPAP since episode of facial cellulitis oct 2014 as fears repeating infection.  Unable to lose wt due to knee pain.  Pt denies chest pain, increased sob or doe, wheezing, orthopnea, PND, increased LE swelling, palpitations, dizziness or syncope.  Pt denies new neurological symptoms such as new headache, or facial or extremity weakness or numbness   Pt denies polydipsia, polyuria,  + hx of prior CVA Past Medical History  Diagnosis Date  . GERD (gastroesophageal reflux disease)   . HLD (hyperlipidemia)   . HTN (hypertension)   . Hematuria 8/12  . PFO (patent foramen ovale)     echo 8/12: EF 55-65%, grade 2 diast dysfnx; +PFO on bubble study;  TEE 4/13: EF normal, atrial septum with suspicion for interatrial septum fenestrations without flow across and few large bubbles noted in LA.  This was not felt to require closure   . CAD (coronary artery disease)     a. s/p promus DES circumflex artery 10/13/10. b. Cath 04/2013: stable disease but possible small vessel disease -Imdur added.  Marland Kitchen DVT (deep venous thrombosis) 2013    "right"   . TIA (transient ischemic attack)     "1-2; both after the one in 02/2010" (09/25/2013)  . OSA on CPAP   . Type II diabetes mellitus     June 2014 AIC 6.2%/notes 09/24/2013; pt denies this hx on 09/25/2013  . Arthritis     "knees" (09/25/2013)  . Rheumatoid arthritis(714.0)   . CVA (cerebral vascular accident) 02/2010    "lost peripheral vision when I had the stroke; some memory issues since" (09/25/2013)   Past Surgical History  Procedure Laterality Date  . Knee arthroscopy Bilateral 2000's    2 on left and 3 on rt  . Tee without cardioversion  03/21/2012    Procedure: TRANSESOPHAGEAL ECHOCARDIOGRAM (TEE);   Surgeon: Tony Records, MD;  Location: [facility] ENDOSCOPY;  Service: Cardiovascular;  Laterality: N/A;  . Coronary angioplasty with stent placement  2012    "1" (09/24/2013)    reports that he quit smoking about 22 months ago. His smoking use included Cigarettes. He has a 35 pack-year smoking history. He has never used smokeless tobacco. He reports that he does not drink alcohol or use illicit drugs. family history is negative for Cancer, Kidney disease, and Diabetes. Allergies  Allergen Reactions  . Iodine     REACTION: anaphylaxis  . Amlodipine     Foot edema   Current Outpatient Prescriptions on File Prior to Visit  Medication Sig Dispense Refill  . aspirin EC 81 MG EC tablet Take 1 tablet (81 mg total) by mouth daily.      Marland Kitchen atorvastatin (LIPITOR) 80 MG tablet Take 1 tablet (80 mg total) by mouth at bedtime.  90 tablet  1  . folic acid (FOLVITE) 1 MG tablet Take 1 mg by mouth daily.      . furosemide (LASIX) 20 MG tablet Take 20 mg by mouth daily.      . isosorbide mononitrate (IMDUR) 30 MG 24 hr tablet TAKE 1 TABLET BY MOUTH AT BEDTIME  30 tablet  11  . mometasone (NASONEX) 50 MCG/ACT nasal spray Place 2 sprays into both nostrils  daily as needed (allergies). To each nostril      . nitroGLYCERIN (NITROSTAT) 0.4 MG SL tablet Place 1 tablet (0.4 mg total) under the tongue every 5 (five) minutes as needed for chest pain (up to 3 doses).  25 tablet  3  . OxyCODONE (OXYCONTIN) 40 mg T12A 12 hr tablet Take 1 tablet (40 mg total) by mouth every 12 (twelve) hours.  60 tablet  0  . Oxycodone HCl 10 MG TABS Take 1 tablet (10 mg total) by mouth every 8 (eight) hours as needed.  75 tablet  0  . pantoprazole (PROTONIX) 40 MG tablet Take 1 tablet (40 mg total) by mouth 2 (two) times daily before a meal.  60 tablet  5  . potassium chloride SA (KLOR-CON M20) 20 MEQ tablet Take 4 tablets (80 mEq total) by mouth 2 (two) times daily.  224 tablet  11  . warfarin (COUMADIN) 7.5 MG tablet TAKE AS DIRECTED BY  COUMADIN CLINIC  40 tablet  2   No current facility-administered medications on file prior to visit.    Review of Systems  Constitutional: Negative for unexpected weight change, or unusual diaphoresis  HENT: Negative for tinnitus.   Eyes: Negative for photophobia and visual disturbance.  Respiratory: Negative for choking and stridor.   Gastrointestinal: Negative for vomiting and blood in stool.  Genitourinary: Negative for hematuria and decreased urine volume.  Musculoskeletal: Negative for acute joint swelling Skin: Negative for color change and wound.  Neurological: Negative for tremors and numbness other than noted  Psychiatric/Behavioral: Negative for decreased concentration or  hyperactivity.       Objective:   Physical Exam BP 142/100  Pulse 86  Temp(Src) 98.1 F (36.7 C) (Oral)  Wt 268 lb (121.564 kg)  SpO2 95% VS noted, not ill appearing but fatigued Constitutional: Pt appears well-developed and well-nourished. [name] HENT: Head: NCAT.  Right Ear: External ear normal.  Left Ear: External ear normal.  Eyes: Conjunctivae and EOM are normal. Pupils are equal, round, and reactive to light.  Neck: Normal range of motion. Neck supple.  Cardiovascular: Normal rate and regular rhythm.   Pulmonary/Chest: Effort normal and breath sounds normal.  Abd:  Soft, NT, non-distended, + BS Neurological: Pt is alert. Not confused , motor grossly intact Skin: Skin is warm. No erythema.  Psychiatric: Pt behavior is normal. Thought content normal.     Assessment & Plan:

## 2014-01-28 NOTE — Telephone Encounter (Signed)
Pharmacist informed of MD instructions.

## 2014-01-28 NOTE — Patient Instructions (Signed)
Please re-start the CPAP as this can help with the blood pressure control OK to increase the coreg to 25 mg twice per day OK to change the losartan 100 mg TO the generic Avapro 300 mg per day Please continue all other medications as before Please have the pharmacy call with any other refills you may need.  Please return in 2 weeks to Dr [name], or sooner if needed

## 2014-01-28 NOTE — Telephone Encounter (Signed)
Pharmacy would like to know if Avapro 300 mg replacing losartan 100 mg by Dr. Angelena Form?

## 2014-01-28 NOTE — Assessment & Plan Note (Signed)
Overall stable, but at high risk for recurrence with uncontrolled HTN

## 2014-01-28 NOTE — Assessment & Plan Note (Signed)
Encouraged to re-start tx, as this will make BP control more difficult if not tx, with incr risk of CVA

## 2014-01-28 NOTE — Telephone Encounter (Signed)
Prior Authorization was faxed to [phone] which is [employer]'s determination fax number. Attempted to fax PA to [phone] which was provided by patient, but fax would not send.

## 2014-01-28 NOTE — Telephone Encounter (Signed)
Patient called and wanted to know if he could take more of his Oxycodone while he was waiting on his OxyContin PA to be approved. I informed patient that he needed to take the Oxycodone how it is prescribed, and if he does take more he will be out before the month is over and will not be prescribed more. Both medications are a 30 days supply.

## 2014-01-28 NOTE — Patient Instructions (Addendum)
Go by Dr. [name] office for blood pressure recheck--appointment with Dr. Jenny Reichmann at 2:15 pm.  196/111 today on recheck at end of visit.

## 2014-01-28 NOTE — Progress Notes (Signed)
Pre-visit discussion using our clinic review tool. No additional management support is needed unless otherwise documented below in the visit note.  

## 2014-01-28 NOTE — Assessment & Plan Note (Signed)
Uncontrolled, possibly exac by untreated OSA, to incr the coreg to 25 bid, and change losartan to avapro 300 which should be more effective; consider add hydralazine if not improved

## 2014-02-01 ENCOUNTER — Telehealth: Payer: Self-pay

## 2014-02-01 NOTE — Telephone Encounter (Signed)
Patient called regarding his medication cost.

## 2014-02-03 NOTE — Telephone Encounter (Signed)
Spoke with patient.  His oxycontin was approved but cost him $180.  He would like a tier exception.  The oxycodone was only $8.  Patient has [employer].  Please call for exception.

## 2014-02-05 NOTE — Telephone Encounter (Signed)
Medicaid call center is closed due to the weather.  Will try again Monday to get a tier exception for patient.  [phone] or [phone]

## 2014-02-09 ENCOUNTER — Ambulatory Visit (INDEPENDENT_AMBULATORY_CARE_PROVIDER_SITE_OTHER): Payer: Medicare Other | Admitting: Pharmacist

## 2014-02-09 DIAGNOSIS — Z7901 Long term (current) use of anticoagulants: Secondary | ICD-10-CM | POA: Diagnosis not present

## 2014-02-09 DIAGNOSIS — I6350 Cerebral infarction due to unspecified occlusion or stenosis of unspecified cerebral artery: Secondary | ICD-10-CM | POA: Diagnosis not present

## 2014-02-09 DIAGNOSIS — I82409 Acute embolism and thrombosis of unspecified deep veins of unspecified lower extremity: Secondary | ICD-10-CM | POA: Diagnosis not present

## 2014-02-09 LAB — POCT INR: INR: 3.5

## 2014-02-10 NOTE — Telephone Encounter (Signed)
Tried to contact [employer] to initiate tier exception.  Did not have correct number.  Spoke with patient and have the correct [employer] [phone].  His ID is [id number]. Form requested for tier exception.

## 2014-02-11 ENCOUNTER — Ambulatory Visit (INDEPENDENT_AMBULATORY_CARE_PROVIDER_SITE_OTHER): Payer: Medicare Other | Admitting: Internal Medicine

## 2014-02-11 ENCOUNTER — Other Ambulatory Visit (INDEPENDENT_AMBULATORY_CARE_PROVIDER_SITE_OTHER): Payer: Medicare Other

## 2014-02-11 ENCOUNTER — Other Ambulatory Visit: Payer: Self-pay | Admitting: Internal Medicine

## 2014-02-11 ENCOUNTER — Ambulatory Visit: Payer: BC Managed Care – PPO | Admitting: Internal Medicine

## 2014-02-11 ENCOUNTER — Encounter: Payer: Self-pay | Admitting: Internal Medicine

## 2014-02-11 VITALS — BP 140/84 | HR 80 | Temp 98.1°F | Resp 16 | Ht 71.0 in | Wt 266.2 lb

## 2014-02-11 DIAGNOSIS — I10 Essential (primary) hypertension: Secondary | ICD-10-CM

## 2014-02-11 DIAGNOSIS — I6350 Cerebral infarction due to unspecified occlusion or stenosis of unspecified cerebral artery: Secondary | ICD-10-CM | POA: Diagnosis not present

## 2014-02-11 DIAGNOSIS — N401 Enlarged prostate with lower urinary tract symptoms: Secondary | ICD-10-CM

## 2014-02-11 DIAGNOSIS — IMO0001 Reserved for inherently not codable concepts without codable children: Secondary | ICD-10-CM

## 2014-02-11 DIAGNOSIS — R351 Nocturia: Secondary | ICD-10-CM

## 2014-02-11 DIAGNOSIS — N138 Other obstructive and reflux uropathy: Secondary | ICD-10-CM | POA: Diagnosis not present

## 2014-02-11 DIAGNOSIS — E785 Hyperlipidemia, unspecified: Secondary | ICD-10-CM | POA: Diagnosis not present

## 2014-02-11 DIAGNOSIS — Z0000 Encounter for general adult medical examination without abnormal findings: Secondary | ICD-10-CM | POA: Diagnosis not present

## 2014-02-11 DIAGNOSIS — E1165 Type 2 diabetes mellitus with hyperglycemia: Principal | ICD-10-CM

## 2014-02-11 DIAGNOSIS — K219 Gastro-esophageal reflux disease without esophagitis: Secondary | ICD-10-CM

## 2014-02-11 DIAGNOSIS — N62 Hypertrophy of breast: Secondary | ICD-10-CM

## 2014-02-11 DIAGNOSIS — D529 Folate deficiency anemia, unspecified: Secondary | ICD-10-CM

## 2014-02-11 LAB — CBC WITH DIFFERENTIAL/PLATELET
BASOS ABS: 0 10*3/uL (ref 0.0–0.1)
Basophils Relative: 0.5 % (ref 0.0–3.0)
Eosinophils Absolute: 0.4 10*3/uL (ref 0.0–0.7)
Eosinophils Relative: 3.7 % (ref 0.0–5.0)
HEMATOCRIT: 42.3 % (ref 39.0–52.0)
HEMOGLOBIN: 14.1 g/dL (ref 13.0–17.0)
LYMPHS ABS: 2.2 10*3/uL (ref 0.7–4.0)
Lymphocytes Relative: 21.3 % (ref 12.0–46.0)
MCHC: 33.3 g/dL (ref 30.0–36.0)
MCV: 88.8 fl (ref 78.0–100.0)
MONO ABS: 0.7 10*3/uL (ref 0.1–1.0)
Monocytes Relative: 6.5 % (ref 3.0–12.0)
Neutro Abs: 6.9 10*3/uL (ref 1.4–7.7)
Neutrophils Relative %: 68 % (ref 43.0–77.0)
Platelets: 306 10*3/uL (ref 150.0–400.0)
RBC: 4.76 Mil/uL (ref 4.22–5.81)
RDW: 13.7 % (ref 11.5–14.6)
WBC: 10.2 10*3/uL (ref 4.5–10.5)

## 2014-02-11 LAB — URINALYSIS, ROUTINE W REFLEX MICROSCOPIC
Bilirubin Urine: NEGATIVE
Ketones, ur: NEGATIVE
Leukocytes, UA: NEGATIVE
Nitrite: NEGATIVE
PH: 6 (ref 5.0–8.0)
TOTAL PROTEIN, URINE-UPE24: 30 — AB
Urine Glucose: NEGATIVE
Urobilinogen, UA: 0.2 (ref 0.0–1.0)

## 2014-02-11 LAB — LIPID PANEL
Cholesterol: 121 mg/dL (ref 0–200)
HDL: 35.3 mg/dL — ABNORMAL LOW (ref >39.00)
LDL Cholesterol: 64 mg/dL (ref 0–99)
Total CHOL/HDL Ratio: 3
Triglycerides: 107 mg/dL (ref 0.0–149.0)
VLDL: 21.4 mg/dL (ref 0.0–40.0)

## 2014-02-11 LAB — COMPREHENSIVE METABOLIC PANEL
ALK PHOS: 127 U/L — AB (ref 39–117)
ALT: 24 U/L (ref 0–53)
AST: 17 U/L (ref 0–37)
Albumin: 4.1 g/dL (ref 3.5–5.2)
BUN: 20 mg/dL (ref 6–23)
CO2: 30 mEq/L (ref 19–32)
CREATININE: 1 mg/dL (ref 0.4–1.5)
Calcium: 9.6 mg/dL (ref 8.4–10.5)
Chloride: 102 mEq/L (ref 96–112)
GFR: 107.83 mL/min (ref >60.00)
Glucose, Bld: 88 mg/dL (ref 70–99)
Potassium: 3.9 mEq/L (ref 3.5–5.1)
SODIUM: 138 meq/L (ref 135–145)
TOTAL PROTEIN: 7.3 g/dL (ref 6.0–8.3)
Total Bilirubin: 0.9 mg/dL (ref 0.3–1.2)

## 2014-02-11 LAB — HEMOGLOBIN A1C: Hgb A1c MFr Bld: 6.8 % — ABNORMAL HIGH (ref 4.6–6.5)

## 2014-02-11 LAB — TSH: TSH: 0.99 u[IU]/mL (ref 0.35–5.50)

## 2014-02-11 LAB — PSA: PSA: 0.32 ng/mL (ref 0.10–4.00)

## 2014-02-11 NOTE — Progress Notes (Signed)
Pre visit review using our clinic review tool, if applicable. No additional management support is needed unless otherwise documented below in the visit note. 

## 2014-02-11 NOTE — Assessment & Plan Note (Signed)
I will recheck his FLP today 

## 2014-02-11 NOTE — Assessment & Plan Note (Signed)
His BP is well controlled 

## 2014-02-11 NOTE — Assessment & Plan Note (Signed)
Exam done  Vaccines were reviewed He was referred for a colonoscopy Labs ordered Pt ed material was given 

## 2014-02-11 NOTE — Assessment & Plan Note (Signed)
I will recheck his A1C and will treat if needed

## 2014-02-11 NOTE — Assessment & Plan Note (Signed)
The software blocked my order for a folate level I will recheck his CBC today

## 2014-02-11 NOTE — Assessment & Plan Note (Signed)
He was advised that this is a consequence of the opiate use He will consider a d/c of the opiates to resolve this

## 2014-02-11 NOTE — Assessment & Plan Note (Signed)
GI referral to see if he needs to have an EGD done

## 2014-02-11 NOTE — Progress Notes (Signed)
Subjective:    Patient ID: Tony Davis, male    DOB: 12-05-1961, 53 y.o.   MRN: [id number]  Gastrophageal Reflux He complains of choking (he chokes at night and regurgs food particles), heartburn and water brash. He reports no abdominal pain, no belching, no chest pain, no coughing, no dysphagia, no early satiety, no globus sensation, no hoarse voice, no nausea, no sore throat, no stridor, no tooth decay or no wheezing. This is a chronic problem. The current episode started more than 1 year ago. The problem occurs constantly. The problem has been gradually worsening. The heartburn duration is several minutes. The heartburn is located in the substernum. The heartburn is of moderate intensity. The heartburn wakes him from sleep. The heartburn limits his activity. The heartburn changes with position. Nothing aggravates the symptoms. Pertinent negatives include no anemia, fatigue, melena, muscle weakness, orthopnea or weight loss. Risk factors include obesity. He has tried a PPI for the symptoms. The treatment provided moderate relief.      Review of Systems  Constitutional: Negative.  Negative for fever, chills, weight loss, diaphoresis, activity change, appetite change, fatigue and unexpected weight change.  HENT: Negative.  Negative for hoarse voice and sore throat.   Eyes: Negative.   Respiratory: Positive for apnea and choking (he chokes at night and regurgs food particles). Negative for cough, chest tightness, shortness of breath, wheezing and stridor.   Cardiovascular: Negative.  Negative for chest pain, palpitations and leg swelling.       His breasts fell sore and enlarged  Gastrointestinal: Positive for heartburn. Negative for dysphagia, nausea, abdominal pain and melena.  Endocrine: Negative.   Genitourinary: Negative.  Negative for urgency, frequency, hematuria, difficulty urinating, penile pain and testicular pain.  Musculoskeletal: Negative.  Negative for muscle weakness.    Allergic/Immunologic: Negative.   Neurological: Negative.   Hematological: Negative.  Negative for adenopathy. Does not bruise/bleed easily.  Psychiatric/Behavioral: Negative.        Objective:   Physical Exam  Vitals reviewed. Constitutional: He is oriented to person, place, and time. He appears well-developed and well-nourished. No distress.  HENT:  Head: Normocephalic and atraumatic.  Mouth/Throat: Oropharynx is clear and moist. No oropharyngeal exudate.  Eyes: Conjunctivae are normal. Right eye exhibits no discharge. Left eye exhibits no discharge. No scleral icterus.  Neck: Normal range of motion. Neck supple. No JVD present. No tracheal deviation present. No thyromegaly present.  Cardiovascular: Normal rate, regular rhythm, normal heart sounds and intact distal pulses.  Exam reveals no gallop and no friction rub.   No murmur heard. Pulmonary/Chest: Effort normal. No accessory muscle usage or stridor. Not tachypneic. No respiratory distress. He has no decreased breath sounds. He has no wheezes. He has rhonchi in the right middle field, the right lower field, the left middle field and the left lower field. He has no rales. Chest wall is not dull to percussion. He exhibits deformity. He exhibits no mass, no tenderness, no bony tenderness, no laceration, no crepitus, no edema, no swelling and no retraction. Right breast exhibits no inverted nipple, no mass, no nipple discharge, no skin change and no tenderness. Left breast exhibits no inverted nipple, no mass, no nipple discharge, no skin change and no tenderness. Breasts are symmetrical.  He has very mild, symmetrical gynecomastia  Abdominal: Soft. Bowel sounds are normal. He exhibits no distension and no mass. There is no tenderness. There is no rebound and no guarding. Hernia confirmed negative in the right inguinal area and confirmed negative  in the left inguinal area.  Genitourinary: Rectum normal, testes normal and penis normal. Rectal  exam shows no external hemorrhoid, no internal hemorrhoid, no fissure, no mass, no tenderness and anal tone normal. Guaiac negative stool. Prostate is enlarged (1+ smooth symm BPH). Prostate is not tender. Right testis shows no mass, no swelling and no tenderness. Right testis is descended. Left testis shows no mass, no swelling and no tenderness. Left testis is descended. Uncircumcised. No phimosis, paraphimosis, hypospadias, penile erythema or penile tenderness. No discharge found.  Musculoskeletal: Normal range of motion. He exhibits no tenderness.  Lymphadenopathy:    He has no cervical adenopathy.       Right: No inguinal adenopathy present.       Left: No inguinal adenopathy present.  Neurological: He is alert and oriented to person, place, and time. He has normal reflexes. He displays normal reflexes. No cranial nerve deficit. He exhibits normal muscle tone. Coordination normal.  Skin: Skin is warm and dry. No rash noted. He is not diaphoretic. No erythema. No pallor.  Psychiatric: He has a normal mood and affect. His behavior is normal. Judgment and thought content normal.     Lab Results  Component Value Date   WBC 8.1 10/25/2013   HGB 15.2 10/25/2013   HCT 43.9 10/25/2013   PLT 288 10/25/2013   GLUCOSE 98 10/25/2013   CHOL 240* 04/23/2013   TRIG 170* 04/23/2013   HDL 27* 04/23/2013   LDLDIRECT 154.3 03/06/2013   LDLCALC 179* 04/23/2013   ALT 27 10/25/2013   AST 24 10/25/2013   NA 140 10/25/2013   K 3.5 10/25/2013   CL 103 10/25/2013   CREATININE 1.14 10/25/2013   BUN 14 10/25/2013   CO2 23 10/25/2013   TSH 1.693 04/22/2013   PSA 0.38 08/06/2011   INR 3.5 02/09/2014   HGBA1C 6.9* 09/24/2013       Assessment & Plan:

## 2014-02-11 NOTE — Assessment & Plan Note (Signed)
He has no s/s that need to be treated I will check his PSA to screen for cancer

## 2014-02-11 NOTE — Patient Instructions (Addendum)
Health Maintenance, Males A healthy lifestyle and preventative care can promote health and wellness.  Maintain regular health, dental, and eye exams.  Eat a healthy diet. Foods like vegetables, fruits, whole grains, low-fat dairy products, and lean protein foods contain the nutrients you need and are low in calories. Decrease your intake of foods high in solid fats, added sugars, and salt. Get information about a proper diet from your health care provider, if necessary.  Regular physical exercise is one of the most important things you can do for your health. Most adults should get at least 150 minutes of moderate-intensity exercise (any activity that increases your heart rate and causes you to sweat) each week. In addition, most adults need muscle-strengthening exercises on 2 or more days a week.   Maintain a healthy weight. The body mass index (BMI) is a screening tool to identify possible weight problems. It provides an estimate of body fat based on height and weight. Your health care provider can find your BMI and can help you achieve or maintain a healthy weight. For males 20 years and older:  A BMI below 18.5 is considered underweight.  A BMI of 18.5 to 24.9 is normal.  A BMI of 25 to 29.9 is considered overweight.  A BMI of 30 and above is considered obese.  Maintain normal blood lipids and cholesterol by exercising and minimizing your intake of saturated fat. Eat a balanced diet with plenty of fruits and vegetables. Blood tests for lipids and cholesterol should begin at age 20 and be repeated every 5 years. If your lipid or cholesterol levels are high, you are over 50, or you are at high risk for heart disease, you may need your cholesterol levels checked more frequently.Ongoing high lipid and cholesterol levels should be treated with medicines, if diet and exercise are not working.  If you smoke, find out from your health care provider how to quit. If you do not use tobacco, do not  start.  Lung cancer screening is recommended for adults aged 55 80 years who are at high risk for developing lung cancer because of a history of smoking. A yearly low-dose CT scan of the lungs is recommended for people who have at least a 30-pack-year history of smoking and are a current smoker or have quit within the past 15 years. A pack year of smoking is smoking an average of 1 pack of cigarettes a day for 1 year (for example, a 30-pack-year history of smoking could mean smoking 1 pack a day for 30 years or 2 packs a day for 15 years). Yearly screening should continue until the smoker has stopped smoking for at least 15 years. Yearly screening should be stopped for people who develop a health problem that would prevent them from having lung cancer treatment.  If you choose to drink alcohol, do not have more than 2 drinks per day. One drink is considered to be 12 oz (360 mL) of beer, 5 oz (150 mL) of wine, or 1.5 oz (45 mL) of liquor.  Avoid use of street drugs. Do not share needles with anyone. Ask for help if you need support or instructions about stopping the use of drugs.  High blood pressure causes heart disease and increases the risk of stroke. Blood pressure should be checked at least every 1 2 years. Ongoing high blood pressure should be treated with medicines if weight loss and exercise are not effective.  If you are 45 51 years old, ask your health   care provider if you should take aspirin to prevent heart disease.  Diabetes screening involves taking a blood sample to check your fasting blood sugar level. This should be done once every 3 years after age 45, if you are at a normal weight and without risk factors for diabetes. Testing should be considered at a younger age or be carried out more frequently if you are overweight and have at least 1 risk factor for diabetes.  Colorectal cancer can be detected and often prevented. Most routine colorectal cancer screening begins at the age of 50  and continues through age 75. However, your health care provider may recommend screening at an earlier age if you have risk factors for colon cancer. On a yearly basis, your health care provider may provide home test kits to check for hidden blood in the stool. A small camera at the end of a tube may be used to directly examine the colon (sigmoidoscopy or colonoscopy) to detect the earliest forms of colorectal cancer. Talk to your health care provider about this at age 50, when routine screening begins. A direct exam of the colon should be repeated every 5 10 years through age 75, unless early forms of pre-cancerous polyps or small growths are found.  People who are at an increased risk for hepatitis B should be screened for this virus. You are considered at high risk for hepatitis B if:  You were born in a country where hepatitis B occurs often. Talk with your health care provider about which countries are considered high-risk.  Your parents were born in a high-risk country and you have not received a shot to protect against hepatitis B (hepatitis B vaccine).  You have HIV or AIDS.  You use needles to inject street drugs.  You live with, or have sex with, someone who has hepatitis B.  You are a man who has sex with other men (MSM).  You get hemodialysis treatment.  You take certain medicines for conditions like cancer, organ transplantation, and autoimmune conditions.  Hepatitis C blood testing is recommended for all people born from 1945 through 1965 and any individual with known risk factors for hepatitis C.  Healthy men should no longer receive prostate-specific antigen (PSA) blood tests as part of routine cancer screening. Talk to your health care provider about prostate cancer screening.  Testicular cancer screening is not recommended for adolescents or adult males who have no symptoms. Screening includes self-exam, a health care provider exam, and other screening tests. Consult with  your health care provider about any symptoms you have or any concerns you have about testicular cancer.  Practice safe sex. Use condoms and avoid high-risk sexual practices to reduce the spread of sexually transmitted infections (STIs).  Use sunscreen. Apply sunscreen liberally and repeatedly throughout the day. You should seek shade when your shadow is shorter than you. Protect yourself by wearing long sleeves, pants, a wide-brimmed hat, and sunglasses year round, whenever you are outdoors.  Tell your health care provider of new moles or changes in moles, especially if there is a change in shape or color. Also tell your provider if a mole is larger than the size of a pencil eraser.  A one-time screening for abdominal aortic aneurysm (AAA) and surgical repair of large AAAs by ultrasound is recommended for men aged 65 75 years who are current or former smokers.  Stay current with your vaccines (immunizations). Document Released: 05/24/2008 Document Revised: 09/16/2013 Document Reviewed: 04/23/2011 [employer] Patient Information 2014 [employer]   Type 2 Diabetes Mellitus, Adult Type 2 diabetes mellitus, often simply referred to as type 2 diabetes, is a long-lasting (chronic) disease. In type 2 diabetes, the pancreas does not make enough insulin (a hormone), the cells are less responsive to the insulin that is made (insulin resistance), or both. Normally, insulin moves sugars from food into the tissue cells. The tissue cells use the sugars for energy. The lack of insulin or the lack of normal response to insulin causes excess sugars to build up in the blood instead of going into the tissue cells. As a result, high blood sugar (hyperglycemia) develops. The effect of high sugar (glucose) levels can cause many complications. Type 2 diabetes was also previously called adult-onset diabetes but it can occur at any age.  RISK FACTORS  A person is predisposed to developing type 2 diabetes if someone in  the family has the disease and also has one or more of the following primary risk factors:  Overweight.  An inactive lifestyle.  A history of consistently eating high-calorie foods. Maintaining a normal weight and regular physical activity can reduce the chance of developing type 2 diabetes. SYMPTOMS  A person with type 2 diabetes may not show symptoms initially. The symptoms of type 2 diabetes appear slowly. The symptoms include:  Increased thirst (polydipsia).  Increased urination (polyuria).  Increased urination during the night (nocturia).  Weight loss. This weight loss may be rapid.  Frequent, recurring infections.  Tiredness (fatigue).  Weakness.  Vision changes, such as blurred vision.  Fruity smell to your breath.  Abdominal pain.  Nausea or vomiting.  Cuts or bruises which are slow to heal.  Tingling or numbness in the hands or feet. DIAGNOSIS Type 2 diabetes is frequently not diagnosed until complications of diabetes are present. Type 2 diabetes is diagnosed when symptoms or complications are present and when blood glucose levels are increased. Your blood glucose level may be checked by one or more of the following blood tests:  A fasting blood glucose test. You will not be allowed to eat for at least 8 hours before a blood sample is taken.  A random blood glucose test. Your blood glucose is checked at any time of the day regardless of when you ate.  A hemoglobin A1c blood glucose test. A hemoglobin A1c test provides information about blood glucose control over the previous 3 months.  An oral glucose tolerance test (OGTT). Your blood glucose is measured after you have not eaten (fasted) for 2 hours and then after you drink a glucose-containing beverage. TREATMENT   You may need to take insulin or diabetes medicine daily to keep blood glucose levels in the desired range.  You will need to match insulin dosing with exercise and healthy food choices. The  treatment goal is to maintain the before meal blood sugar (preprandial glucose) level at 70 130 mg/dL. HOME CARE INSTRUCTIONS   Have your hemoglobin A1c level checked twice a year.  Perform daily blood glucose monitoring as directed by your caregiver.  Monitor urine ketones when you are ill and as directed by your caregiver.  Take your diabetes medicine or insulin as directed by your caregiver to maintain your blood glucose levels in the desired range.  Never run out of diabetes medicine or insulin. It is needed every day.  Adjust insulin based on your intake of carbohydrates. Carbohydrates can raise blood glucose levels but need to be included in your diet. Carbohydrates provide vitamins, minerals, and fiber which are an essential part   of a healthy diet. Carbohydrates are found in fruits, vegetables, whole grains, dairy products, legumes, and foods containing added sugars.    Eat healthy foods. Alternate 3 meals with 3 snacks.  Lose weight if overweight.  Carry a medical alert card or wear your medical alert jewelry.  Carry a 15 gram carbohydrate snack with you at all times to treat low blood glucose (hypoglycemia). Some examples of 15 gram carbohydrate snacks include:  Glucose tablets, 3 or 4   Glucose gel, 15 gram tube  Raisins, 2 tablespoons (24 grams)  Jelly beans, 6  Animal crackers, 8  Regular pop, 4 ounces (120 mL)  Gummy treats, 9  Recognize hypoglycemia. Hypoglycemia occurs with blood glucose levels of 70 mg/dL and below. The risk for hypoglycemia increases when fasting or skipping meals, during or after intense exercise, and during sleep. Hypoglycemia symptoms can include:  Tremors or shakes.  Decreased ability to concentrate.  Sweating.  Increased heart rate.  Headache.  Dry mouth.  Hunger.  Irritability.  Anxiety.  Restless sleep.  Altered speech or coordination.  Confusion.  Treat hypoglycemia promptly. If you are alert and able to  safely swallow, follow the 15:15 rule:  Take 15 20 grams of rapid-acting glucose or carbohydrate. Rapid-acting options include glucose gel, glucose tablets, or 4 ounces (120 mL) of fruit juice, regular soda, or low fat milk.  Check your blood glucose level 15 minutes after taking the glucose.  Take 15 20 grams more of glucose if the repeat blood glucose level is still 70 mg/dL or below.  Eat a meal or snack within 1 hour once blood glucose levels return to normal.    Be alert to polyuria and polydipsia which are early signs of hyperglycemia. An early awareness of hyperglycemia allows for prompt treatment. Treat hyperglycemia as directed by your caregiver.  Engage in at least 150 minutes of moderate-intensity physical activity a week, spread over at least 3 days of the week or as directed by your caregiver. In addition, you should engage in resistance exercise at least 2 times a week or as directed by your caregiver.  Adjust your medicine and food intake as needed if you start a new exercise or sport.  Follow your sick day plan at any time you are unable to eat or drink as usual.  Avoid tobacco use.  Limit alcohol intake to no more than 1 drink per day for nonpregnant women and 2 drinks per day for men. You should drink alcohol only when you are also eating food. Talk with your caregiver whether alcohol is safe for you. Tell your caregiver if you drink alcohol several times a week.  Follow up with your caregiver regularly.  Schedule an eye exam soon after the diagnosis of type 2 diabetes and then annually.  Perform daily skin and foot care. Examine your skin and feet daily for cuts, bruises, redness, nail problems, bleeding, blisters, or sores. A foot exam by a caregiver should be done annually.  Brush your teeth and gums at least twice a day and floss at least once a day. Follow up with your dentist regularly.  Share your diabetes management plan with your workplace or  school.  Stay up-to-date with immunizations.  Learn to manage stress.  Obtain ongoing diabetes education and support as needed.  Participate in, or seek rehabilitation as needed to maintain or improve independence and quality of life. Request a physical or occupational therapy referral if you are having foot or hand numbness or difficulties with   grooming, dressing, eating, or physical activity. SEEK MEDICAL CARE IF:   You are unable to eat food or drink fluids for more than 6 hours.  You have nausea and vomiting for more than 6 hours.  Your blood glucose level is over 240 mg/dL.  There is a change in mental status.  You develop an additional serious illness.  You have diarrhea for more than 6 hours.  You have been sick or have had a fever for a couple of days and are not getting better.  You have pain during any physical activity.  SEEK IMMEDIATE MEDICAL CARE IF:  You have difficulty breathing.  You have moderate to large ketone levels. MAKE SURE YOU:  Understand these instructions.  Will watch your condition.  Will get help right away if you are not doing well or get worse. Document Released: 11/26/2005 Document Revised: 08/20/2012 Document Reviewed: 06/24/2012 [facility] Patient Information 2014 [email or web address]

## 2014-02-22 ENCOUNTER — Emergency Department (HOSPITAL_COMMUNITY)
Admission: EM | Admit: 2014-02-22 | Discharge: 2014-02-22 | Disposition: A | Discharge status: Home / Self Care | Payer: Medicare Other | Attending: Emergency Medicine | Admitting: Emergency Medicine

## 2014-02-22 ENCOUNTER — Other Ambulatory Visit: Payer: Self-pay | Admitting: *Deleted

## 2014-02-22 ENCOUNTER — Emergency Department (HOSPITAL_COMMUNITY): Payer: Medicare Other

## 2014-02-22 ENCOUNTER — Encounter (HOSPITAL_COMMUNITY): Payer: Self-pay | Admitting: Emergency Medicine

## 2014-02-22 DIAGNOSIS — Z7982 Long term (current) use of aspirin: Secondary | ICD-10-CM | POA: Insufficient documentation

## 2014-02-22 DIAGNOSIS — Z87891 Personal history of nicotine dependence: Secondary | ICD-10-CM | POA: Insufficient documentation

## 2014-02-22 DIAGNOSIS — M1712 Unilateral primary osteoarthritis, left knee: Secondary | ICD-10-CM

## 2014-02-22 DIAGNOSIS — M1711 Unilateral primary osteoarthritis, right knee: Secondary | ICD-10-CM

## 2014-02-22 DIAGNOSIS — R5383 Other fatigue: Secondary | ICD-10-CM | POA: Diagnosis not present

## 2014-02-22 DIAGNOSIS — R509 Fever, unspecified: Secondary | ICD-10-CM | POA: Diagnosis not present

## 2014-02-22 DIAGNOSIS — Z9861 Coronary angioplasty status: Secondary | ICD-10-CM | POA: Insufficient documentation

## 2014-02-22 DIAGNOSIS — B9789 Other viral agents as the cause of diseases classified elsewhere: Secondary | ICD-10-CM | POA: Diagnosis not present

## 2014-02-22 DIAGNOSIS — H538 Other visual disturbances: Secondary | ICD-10-CM | POA: Insufficient documentation

## 2014-02-22 DIAGNOSIS — I2510 Atherosclerotic heart disease of native coronary artery without angina pectoris: Secondary | ICD-10-CM | POA: Insufficient documentation

## 2014-02-22 DIAGNOSIS — Z8774 Personal history of (corrected) congenital malformations of heart and circulatory system: Secondary | ICD-10-CM | POA: Insufficient documentation

## 2014-02-22 DIAGNOSIS — K219 Gastro-esophageal reflux disease without esophagitis: Secondary | ICD-10-CM | POA: Insufficient documentation

## 2014-02-22 DIAGNOSIS — Z79899 Other long term (current) drug therapy: Secondary | ICD-10-CM | POA: Insufficient documentation

## 2014-02-22 DIAGNOSIS — Z8673 Personal history of transient ischemic attack (TIA), and cerebral infarction without residual deficits: Secondary | ICD-10-CM | POA: Insufficient documentation

## 2014-02-22 DIAGNOSIS — R5381 Other malaise: Secondary | ICD-10-CM | POA: Diagnosis not present

## 2014-02-22 DIAGNOSIS — E7800 Pure hypercholesterolemia, unspecified: Secondary | ICD-10-CM | POA: Insufficient documentation

## 2014-02-22 DIAGNOSIS — I10 Essential (primary) hypertension: Secondary | ICD-10-CM | POA: Diagnosis not present

## 2014-02-22 DIAGNOSIS — B349 Viral infection, unspecified: Secondary | ICD-10-CM

## 2014-02-22 DIAGNOSIS — M069 Rheumatoid arthritis, unspecified: Secondary | ICD-10-CM | POA: Insufficient documentation

## 2014-02-22 DIAGNOSIS — Z9981 Dependence on supplemental oxygen: Secondary | ICD-10-CM | POA: Insufficient documentation

## 2014-02-22 DIAGNOSIS — Z7901 Long term (current) use of anticoagulants: Secondary | ICD-10-CM | POA: Insufficient documentation

## 2014-02-22 DIAGNOSIS — R51 Headache: Secondary | ICD-10-CM | POA: Insufficient documentation

## 2014-02-22 DIAGNOSIS — E119 Type 2 diabetes mellitus without complications: Secondary | ICD-10-CM | POA: Insufficient documentation

## 2014-02-22 DIAGNOSIS — G4733 Obstructive sleep apnea (adult) (pediatric): Secondary | ICD-10-CM | POA: Insufficient documentation

## 2014-02-22 LAB — CBC WITH DIFFERENTIAL/PLATELET
BASOS PCT: 0 % (ref 0–1)
Basophils Absolute: 0 10*3/uL (ref 0.0–0.1)
EOS PCT: 1 % (ref 0–5)
Eosinophils Absolute: 0.1 10*3/uL (ref 0.0–0.7)
HEMATOCRIT: 44 % (ref 39.0–52.0)
Hemoglobin: 14.9 g/dL (ref 13.0–17.0)
Lymphocytes Relative: 16 % (ref 12–46)
Lymphs Abs: 2.1 10*3/uL (ref 0.7–4.0)
MCH: 30.1 pg (ref 26.0–34.0)
MCHC: 33.9 g/dL (ref 30.0–36.0)
MCV: 88.9 fL (ref 78.0–100.0)
MONO ABS: 1 10*3/uL (ref 0.1–1.0)
Monocytes Relative: 7 % (ref 3–12)
Neutro Abs: 10 10*3/uL — ABNORMAL HIGH (ref 1.7–7.7)
Neutrophils Relative %: 76 % (ref 43–77)
Platelets: 280 10*3/uL (ref 150–400)
RBC: 4.95 MIL/uL (ref 4.22–5.81)
RDW: 13 % (ref 11.5–15.5)
WBC: 13.2 10*3/uL — ABNORMAL HIGH (ref 4.0–10.5)

## 2014-02-22 LAB — COMPREHENSIVE METABOLIC PANEL
ALBUMIN: 4.1 g/dL (ref 3.5–5.2)
ALT: 21 U/L (ref 0–53)
AST: 19 U/L (ref 0–37)
Alkaline Phosphatase: 132 U/L — ABNORMAL HIGH (ref 39–117)
BUN: 18 mg/dL (ref 6–23)
CALCIUM: 9.2 mg/dL (ref 8.4–10.5)
CO2: 25 meq/L (ref 19–32)
Chloride: 100 mEq/L (ref 96–112)
Creatinine, Ser: 1.12 mg/dL (ref 0.50–1.35)
GFR calc Af Amer: 87 mL/min — ABNORMAL LOW (ref >90)
GFR, EST NON AFRICAN AMERICAN: 75 mL/min — AB (ref >90)
Glucose, Bld: 124 mg/dL — ABNORMAL HIGH (ref 70–99)
Potassium: 3.6 mEq/L — ABNORMAL LOW (ref 3.7–5.3)
Sodium: 140 mEq/L (ref 137–147)
Total Bilirubin: 0.9 mg/dL (ref 0.3–1.2)
Total Protein: 7.6 g/dL (ref 6.0–8.3)

## 2014-02-22 LAB — URINALYSIS, ROUTINE W REFLEX MICROSCOPIC
Bilirubin Urine: NEGATIVE
Glucose, UA: NEGATIVE mg/dL
Ketones, ur: NEGATIVE mg/dL
Nitrite: NEGATIVE
Protein, ur: 30 mg/dL — AB
SPECIFIC GRAVITY, URINE: 1.021 (ref 1.005–1.030)
Urobilinogen, UA: 1 mg/dL (ref 0.0–1.0)
pH: 6.5 (ref 5.0–8.0)

## 2014-02-22 LAB — URINE MICROSCOPIC-ADD ON

## 2014-02-22 MED ORDER — DEXAMETHASONE SODIUM PHOSPHATE 10 MG/ML IJ SOLN
10.0000 mg | Freq: Once | INTRAMUSCULAR | Status: AC
  Administered 2014-02-22: 10 mg via INTRAVENOUS
  Filled 2014-02-22: qty 1

## 2014-02-22 MED ORDER — DIPHENHYDRAMINE HCL 50 MG/ML IJ SOLN
25.0000 mg | Freq: Once | INTRAMUSCULAR | Status: AC
  Administered 2014-02-22: 25 mg via INTRAVENOUS
  Filled 2014-02-22: qty 1

## 2014-02-22 MED ORDER — PROCHLORPERAZINE EDISYLATE 5 MG/ML IJ SOLN
10.0000 mg | Freq: Once | INTRAMUSCULAR | Status: AC
  Administered 2014-02-22: 10 mg via INTRAVENOUS
  Filled 2014-02-22: qty 2

## 2014-02-22 MED ORDER — SODIUM CHLORIDE 0.9 % IV BOLUS (SEPSIS)
1000.0000 mL | Freq: Once | INTRAVENOUS | Status: AC
  Administered 2014-02-22: 1000 mL via INTRAVENOUS

## 2014-02-22 MED ORDER — OXYCODONE HCL ER 40 MG PO T12A: 40.0000 mg | EXTENDED_RELEASE_TABLET | Freq: Two times a day (BID) | ORAL | Status: DC

## 2014-02-22 MED ORDER — OXYCODONE HCL 10 MG PO TABS: 10.0000 mg | ORAL_TABLET | Freq: Three times a day (TID) | ORAL | Status: DC | PRN

## 2014-02-22 NOTE — ED Notes (Signed)
Dr.Brtalik at bedside  

## 2014-02-22 NOTE — ED Provider Notes (Signed)
CSN: [id number]     Arrival date & time 02/22/14  1549 History   First MD Initiated Contact with Patient 02/22/14 1550     Chief Complaint  Patient presents with  . Fever     (Consider location/radiation/quality/duration/timing/severity/associated sxs/prior Treatment) Patient is a 53 y.o. male presenting with fever. The history is provided by the patient.  Fever Max temp prior to arrival:  102.2 Temp source:  Oral Severity:  Mild Onset quality:  Unable to specify Duration:  1 day Timing:  Constant Progression:  Unchanged Chronicity:  New Relieved by:  Acetaminophen Worsened by:  Nothing tried Ineffective treatments:  None tried Associated symptoms: headaches (states has hx of migraine and feels like migraine) and myalgias   Associated symptoms: no chest pain, no confusion, no congestion, no cough, no diarrhea, no nausea, no rash, no rhinorrhea, no sore throat and no vomiting   Risk factors: no hx of cancer, no immunosuppression and no recent surgery     Past Medical History  Diagnosis Date  . GERD (gastroesophageal reflux disease)   . HLD (hyperlipidemia)   . HTN (hypertension)   . Hematuria 8/12  . PFO (patent foramen ovale)     echo 8/12: EF 55-65%, grade 2 diast dysfnx; +PFO on bubble study;  TEE 4/13: EF normal, atrial septum with suspicion for interatrial septum fenestrations without flow across and few large bubbles noted in LA.  This was not felt to require closure   . CAD (coronary artery disease)     a. s/p promus DES circumflex artery 10/13/10. b. Cath 04/2013: stable disease but possible small vessel disease -Imdur added.  Marland Kitchen DVT (deep venous thrombosis) 2013    "right"   . TIA (transient ischemic attack)     "1-2; both after the one in 02/2010" (09/25/2013)  . OSA on CPAP   . Type II diabetes mellitus     June 2014 AIC 6.2%/notes 09/24/2013; pt denies this hx on 09/25/2013  . Arthritis     "knees" (09/25/2013)  . Rheumatoid arthritis(714.0)   . CVA (cerebral  vascular accident) 02/2010    "lost peripheral vision when I had the stroke; some memory issues since" (09/25/2013)   Past Surgical History  Procedure Laterality Date  . Knee arthroscopy Bilateral 2000's    2 on left and 3 on rt  . Tee without cardioversion  03/21/2012    Procedure: TRANSESOPHAGEAL ECHOCARDIOGRAM (TEE);  Surgeon: Fay Records, MD;  Location: [facility] ENDOSCOPY;  Service: Cardiovascular;  Laterality: N/A;  . Coronary angioplasty with stent placement  2012    "1" (09/24/2013)   Family History  Problem Relation Age of Onset  . Cancer Neg Hx   . Kidney disease Neg Hx   . Diabetes Neg Hx    History  Substance Use Topics  . Smoking status: Former Smoker -- 1.00 packs/day for 35 years    Types: Cigarettes    Quit date: 03/10/2012  . Smokeless tobacco: Never Used  . Alcohol Use: No    Review of Systems  Constitutional: Positive for fever. Negative for activity change and appetite change.  HENT: Negative for congestion, rhinorrhea and sore throat.   Eyes: Positive for photophobia (with HA). Negative for discharge and itching.  Respiratory: Negative for cough, shortness of breath and wheezing.   Cardiovascular: Negative for chest pain.  Gastrointestinal: Negative for nausea, vomiting, abdominal pain, diarrhea and constipation.  Genitourinary: Negative for hematuria, decreased urine volume and difficulty urinating.  Musculoskeletal: Positive for myalgias.  Skin: Negative  for rash and wound.  Neurological: Positive for headaches (states has hx of migraine and feels like migraine). Negative for syncope, weakness and numbness.  Psychiatric/Behavioral: Negative for confusion.  All other systems reviewed and are negative.      Allergies  Iodine and Amlodipine  Home Medications   Current Outpatient Rx  Name  Route  Sig  Dispense  Refill  . acetaminophen (TYLENOL) 160 MG/5ML liquid   Oral   Take 160 mg by mouth every 4 (four) hours as needed for fever.         Marland Kitchen  aspirin EC 81 MG EC tablet   Oral   Take 1 tablet (81 mg total) by mouth daily.         Marland Kitchen atorvastatin (LIPITOR) 80 MG tablet   Oral   Take 1 tablet (80 mg total) by mouth at bedtime.   90 tablet   1   . carvedilol (COREG) 25 MG tablet   Oral   Take 1 tablet (25 mg total) by mouth 2 (two) times daily with a meal.   180 tablet   3   . folic acid (FOLVITE) 1 MG tablet   Oral   Take 1 mg by mouth daily.         . furosemide (LASIX) 20 MG tablet   Oral   Take 20 mg by mouth daily.         . irbesartan (AVAPRO) 300 MG tablet   Oral   Take 1 tablet (300 mg total) by mouth daily.   90 tablet   3   . isosorbide mononitrate (IMDUR) 30 MG 24 hr tablet      TAKE 1 TABLET BY MOUTH AT BEDTIME   30 tablet   11     CYCLE FILL MEDICATION. Authorization is required f ...   . mometasone (NASONEX) 50 MCG/ACT nasal spray   Each Nare   Place 2 sprays into both nostrils daily as needed (allergies). To each nostril         . OxyCODONE (OXYCONTIN) 40 mg T12A 12 hr tablet   Oral   Take 1 tablet (40 mg total) by mouth every 12 (twelve) hours.   60 tablet   0     One month supply   . Oxycodone HCl 10 MG TABS   Oral   Take 1 tablet (10 mg total) by mouth every 8 (eight) hours as needed.   75 tablet   0     One month supply   . pantoprazole (PROTONIX) 40 MG tablet   Oral   Take 1 tablet (40 mg total) by mouth 2 (two) times daily before a meal.   60 tablet   5   . potassium chloride SA (KLOR-CON M20) 20 MEQ tablet   Oral   Take 4 tablets (80 mEq total) by mouth 2 (two) times daily.   224 tablet   11   . warfarin (COUMADIN) 7.5 MG tablet   Oral   Take 7.5 mg by mouth daily.         . nitroGLYCERIN (NITROSTAT) 0.4 MG SL tablet   Sublingual   Place 1 tablet (0.4 mg total) under the tongue every 5 (five) minutes as needed for chest pain (up to 3 doses).   25 tablet   3    BP 144/83  Pulse 87  Temp(Src) 99.3 F (37.4 C) (Oral)  Resp 19  Ht 5\' 11"  (1.803 m)   Wt 260 lb (117.935 kg)  BMI 36.28 kg/m2  SpO2 99% Physical Exam  Vitals reviewed. Constitutional: He is oriented to person, place, and time. He appears well-developed and well-nourished. No distress.  NAD  HENT:  Head: Normocephalic and atraumatic.  Mouth/Throat: Oropharynx is clear and moist. No oropharyngeal exudate.  Eyes: Conjunctivae and EOM are normal. Pupils are equal, round, and reactive to light. Right eye exhibits no discharge. Left eye exhibits no discharge. No scleral icterus.  Neck: Normal range of motion. Neck supple.  No meningismus  Cardiovascular: Normal rate, regular rhythm, normal heart sounds and intact distal pulses.  Exam reveals no gallop and no friction rub.   No murmur heard. Pulmonary/Chest: Effort normal and breath sounds normal. No respiratory distress. He has no wheezes. He has no rales.  Abdominal: Soft. He exhibits no distension and no mass. There is no tenderness.  Musculoskeletal: Normal range of motion.  Neurological: He is alert and oriented to person, place, and time. No cranial nerve deficit. He exhibits normal muscle tone. Coordination normal.  5/5 strength in all exts, normal sensation in all exts, 2+ DTRs in patella and brachioradilias b/l, F2N negative b/l, no ataxia  Skin: Skin is warm. No rash noted. He is not diaphoretic.    ED Course  Procedures (including critical care time) Labs Review Labs Reviewed  CBC WITH DIFFERENTIAL - Abnormal; Notable for the following:    WBC 13.2 (*)    Neutro Abs 10.0 (*)    All other components within normal limits  COMPREHENSIVE METABOLIC PANEL - Abnormal; Notable for the following:    Potassium 3.6 (*)    Glucose, Bld 124 (*)    Alkaline Phosphatase 132 (*)    GFR calc non Af Amer 75 (*)    GFR calc Af Amer 87 (*)    All other components within normal limits  URINALYSIS, ROUTINE W REFLEX MICROSCOPIC - Abnormal; Notable for the following:    Hgb urine dipstick MODERATE (*)    Protein, ur 30 (*)     Leukocytes, UA TRACE (*)    All other components within normal limits  URINE MICROSCOPIC-ADD ON - Abnormal; Notable for the following:    Casts HYALINE CASTS (*)    All other components within normal limits  URINE CULTURE   Imaging Review Dg Chest 2 View  02/22/2014   CLINICAL DATA:  Fever 102 degrees for 1 day, lethargy, weakness, hypertension, COPD, smoker, diabetes, coronary artery disease  EXAM: CHEST  2 VIEW  COMPARISON:  12/18/2013  FINDINGS: Enlargement of cardiac silhouette.  Mild elongation of thoracic aorta.  Mediastinal contours and pulmonary vascularity otherwise normal.  Lungs clear.  No pleural effusion or pneumothorax.  Bones unremarkable.  IMPRESSION: Enlargement of cardiac silhouette.  No acute abnormalities.   Electronically Signed   By: Lavonia Dana M.D.   On: 02/22/2014 17:27     EKG Interpretation None      MDM   MDM: Pt with cc: of fever. Pt went to PCP today and sent for fever. Has had 3 days of malaiase, also complaining of HA. States HA similar to migraines he has had in past. Not sudden onset, no neck involvement. States he had the feve rbefore the HA. Here Afebrile, but had Tylenol prior. Exam unremarkable, no meningismus, no neuro deficits. Well appearing. Given fluids, migraine cocktail, feels better. Mildly somnolent after Benadryl but returned to baseline. Wife at bedside states he looks his baseline. Labs normal. CXR normal. Likely viral syndrome. Recommend see PCP later this week for recheck  and return to ER if any change in sxs. Unlikely meningitis with hx of similar HAs, no meningismus, well appearance. Discharged. Care of case d/w my attending.  Final diagnoses:  Fever  Viral syndrome    Discharged  Sol Passer, MD 02/24/14 1447

## 2014-02-22 NOTE — Discharge Instructions (Signed)
If you develop any new or worsening symptoms, return to Emergency Department. See your regular doctor in 3 days.  Fever, Adult A fever is a higher than normal body temperature. In an adult, an oral temperature around 98.6 F (37 C) is considered normal. A temperature of 100.4 F (38 C) or higher is generally considered a fever. Mild or moderate fevers generally have no long-term effects and often do not require treatment. Extreme fever (greater than or equal to 106 F or 41.1 C) can cause seizures. The sweating that may occur with repeated or prolonged fever may cause dehydration. Elderly people can develop confusion during a fever. A measured temperature can vary with:  Age.  Time of day.  Method of measurement (mouth, underarm, rectal, or ear). The fever is confirmed by taking a temperature with a thermometer. Temperatures can be taken different ways. Some methods are accurate and some are not.  An oral temperature is used most commonly. Electronic thermometers are fast and accurate.  An ear temperature will only be accurate if the thermometer is positioned as recommended by the manufacturer.  A rectal temperature is accurate and done for those adults who have a condition where an oral temperature cannot be taken.  An underarm (axillary) temperature is not accurate and not recommended. Fever is a symptom, not a disease.  CAUSES   Infections commonly cause fever.  Some noninfectious causes for fever include:  Some arthritis conditions.  Some thyroid or adrenal gland conditions.  Some immune system conditions.  Some types of cancer.  A medicine reaction.  High doses of certain street drugs such as methamphetamine.  Dehydration.  Exposure to high outside or room temperatures.  Occasionally, the source of a fever cannot be determined. This is sometimes called a "fever of unknown origin" (FUO).  Some situations may lead to a temporary rise in body temperature that may go  away on its own. Examples are:  Childbirth.  Surgery.  Intense exercise. HOME CARE INSTRUCTIONS   Take appropriate medicines for fever. Follow dosing instructions carefully. If you use acetaminophen to reduce the fever, be careful to avoid taking other medicines that also contain acetaminophen. Do not take aspirin for a fever if you are younger than age 55. There is an association with Reye's syndrome. Reye's syndrome is a rare but potentially deadly disease.  If an infection is present and antibiotics have been prescribed, take them as directed. Finish them even if you start to feel better.  Rest as needed.  Maintain an adequate fluid intake. To prevent dehydration during an illness with prolonged or recurrent fever, you may need to drink extra fluid.Drink enough fluids to keep your urine clear or pale yellow.  Sponging or bathing with room temperature water may help reduce body temperature. Do not use ice water or alcohol sponge baths.  Dress comfortably, but do not over-bundle. SEEK MEDICAL CARE IF:   You are unable to keep fluids down.  You develop vomiting or diarrhea.  You are not feeling at least partly better after 3 days.  You develop new symptoms or problems. SEEK IMMEDIATE MEDICAL CARE IF:   You have shortness of breath or trouble breathing.  You develop excessive weakness.  You are dizzy or you faint.  You are extremely thirsty or you are making little or no urine.  You develop new pain that was not there before (such as in the head, neck, chest, back, or abdomen).  You have persistant vomiting and diarrhea for more than 1  to 2 days.  You develop a stiff neck or your eyes become sensitive to light.  You develop a skin rash.  You have a fever or persistent symptoms for more than 2 to 3 days.  You have a fever and your symptoms suddenly get worse. MAKE SURE YOU:   Understand these instructions.  Will watch your condition.  Will get help right away if  you are not doing well or get worse. Document Released: 05/22/2001 Document Revised: 02/18/2012 Document Reviewed: 09/27/2011 [facility] Patient Information 2014 [place], [place].

## 2014-02-22 NOTE — ED Notes (Signed)
Given urinal, prompted to void

## 2014-02-22 NOTE — ED Notes (Signed)
Notified Dr. Sunny Schlein that pt's urinalysis has resulted back. Pt and pt's wife updated

## 2014-02-22 NOTE — ED Notes (Signed)
Pt was at PCP for reflux, found pt febrile 102.2, body aches, h/a, chills, reports symptoms for 2 days.  Was given 1000mg  APAP PTA,   BP: 174/109, hx of HTN, has not taken BP med yet. BGP: 99 Spo2:99 HR: 110

## 2014-02-22 NOTE — ED Notes (Signed)
Offered pt urinal

## 2014-02-22 NOTE — Telephone Encounter (Signed)
RX printed for MD to sign for RN visit 02/26/14 

## 2014-02-22 NOTE — ED Notes (Signed)
Pt comfortable with discharge and follow up instructions. No prescriptions given.

## 2014-02-23 LAB — URINE CULTURE
COLONY COUNT: NO GROWTH
Culture: NO GROWTH
SPECIAL REQUESTS: NORMAL

## 2014-02-24 NOTE — ED Provider Notes (Signed)
I saw and evaluated the patient, reviewed the resident's note and I agree with the findings and plan.   EKG Interpretation None      Pt with fever of unknown origin who does not display meningeal sx and no hx of immunosuppression. Pt's labs without acute findings.  Pt HD stable and o/w well appearing.  Family present and findings discussed with them and pt.  Feel pt stable to d/c home and given strict return precautions.  Blanchie Dessert, MD 02/24/14 1500

## 2014-02-26 ENCOUNTER — Encounter: Payer: Medicare Other | Attending: Physical Medicine & Rehabilitation | Admitting: Physical Medicine & Rehabilitation

## 2014-02-26 ENCOUNTER — Encounter: Payer: Self-pay | Admitting: Physical Medicine & Rehabilitation

## 2014-02-26 ENCOUNTER — Ambulatory Visit (INDEPENDENT_AMBULATORY_CARE_PROVIDER_SITE_OTHER): Payer: Medicare Other | Admitting: *Deleted

## 2014-02-26 VITALS — BP 143/101 | HR 88 | Resp 14 | Ht 72.0 in | Wt 263.0 lb

## 2014-02-26 DIAGNOSIS — M1711 Unilateral primary osteoarthritis, right knee: Secondary | ICD-10-CM

## 2014-02-26 DIAGNOSIS — Z7901 Long term (current) use of anticoagulants: Secondary | ICD-10-CM | POA: Diagnosis not present

## 2014-02-26 DIAGNOSIS — IMO0002 Reserved for concepts with insufficient information to code with codable children: Secondary | ICD-10-CM | POA: Insufficient documentation

## 2014-02-26 DIAGNOSIS — M1710 Unilateral primary osteoarthritis, unspecified knee: Secondary | ICD-10-CM | POA: Diagnosis not present

## 2014-02-26 DIAGNOSIS — I6350 Cerebral infarction due to unspecified occlusion or stenosis of unspecified cerebral artery: Secondary | ICD-10-CM

## 2014-02-26 DIAGNOSIS — M1712 Unilateral primary osteoarthritis, left knee: Secondary | ICD-10-CM

## 2014-02-26 DIAGNOSIS — I82409 Acute embolism and thrombosis of unspecified deep veins of unspecified lower extremity: Secondary | ICD-10-CM

## 2014-02-26 LAB — POCT INR: INR: 2.4

## 2014-02-26 MED ORDER — OXYCODONE HCL ER 40 MG PO T12A: 40.0000 mg | EXTENDED_RELEASE_TABLET | Freq: Two times a day (BID) | ORAL | Status: DC

## 2014-02-26 MED ORDER — OXYCODONE HCL 15 MG PO TABS: 15.0000 mg | ORAL_TABLET | Freq: Three times a day (TID) | ORAL | Status: DC | PRN

## 2014-02-26 NOTE — Patient Instructions (Signed)
PLEASE CALL ME WITH ANY PROBLEMS OR QUESTIONS (#[phone]).      

## 2014-02-26 NOTE — Progress Notes (Signed)
Subjective:    Patient ID: Tony Davis, male    DOB: 1961-12-06, 53 y.o.   MRN: [id number]  HPI  Mr. Agyeman is back regarding his chronic knee pain. While he feels his pain is better he has issues, particularly with the cool, damp weather. He hopes that with the warming weather, that his knees will feel better.   He is on oxycontin 40 q12 and oxy ir10mg  a8 prn.   He has knee sleeves that he sometimes wears but they dont' really help.  Pain Inventory Average Pain 8 Pain Right Now 8 My pain is aching  In the last 24 hours, has pain interfered with the following? General activity 8 Relation with others 8 Enjoyment of life 8 What TIME of day is your pain at its worst? daytime,evening Sleep (in general) Fair  Pain is worse with: walking, sitting, inactivity and standing Pain improves with: medication Relief from Meds: 7  Mobility walk with assistance use a cane transfers alone  Function Do you have any goals in this area?  no  Neuro/Psych No problems in this area  Prior Studies Any changes since last visit?  no  Physicians involved in your care Any changes since last visit?  no   Family History  Problem Relation Age of Onset  . Cancer Neg Hx   . Kidney disease Neg Hx   . Diabetes Neg Hx    History   Social History  . Marital Status: Married    Spouse Name: N/A    Number of Children: N/A  . Years of Education: N/A   Social History Main Topics  . Smoking status: Former Smoker -- 1.00 packs/day for 35 years    Types: Cigarettes    Quit date: 03/10/2012  . Smokeless tobacco: Never Used  . Alcohol Use: No  . Drug Use: No  . Sexual Activity: Yes   Other Topics Concern  . None   Social History Narrative   Married, 5 kids; lives in [place]. 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    Past Surgical History  Procedure Laterality Date  . Knee arthroscopy Bilateral 2000's    2 on left and 3 on rt  . Tee without cardioversion  03/21/2012     Procedure: TRANSESOPHAGEAL ECHOCARDIOGRAM (TEE);  Surgeon: Fay Records, MD;  Location: [facility] ENDOSCOPY;  Service: Cardiovascular;  Laterality: N/A;  . Coronary angioplasty with stent placement  2012    "1" (09/24/2013)   Past Medical History  Diagnosis Date  . GERD (gastroesophageal reflux disease)   . HLD (hyperlipidemia)   . HTN (hypertension)   . Hematuria 8/12  . PFO (patent foramen ovale)     echo 8/12: EF 55-65%, grade 2 diast dysfnx; +PFO on bubble study;  TEE 4/13: EF normal, atrial septum with suspicion for interatrial septum fenestrations without flow across and few large bubbles noted in LA.  This was not felt to require closure   . CAD (coronary artery disease)     a. s/p promus DES circumflex artery 10/13/10. b. Cath 04/2013: stable disease but possible small vessel disease -Imdur added.  Marland Kitchen DVT (deep venous thrombosis) 2013    "right"   . TIA (transient ischemic attack)     "1-2; both after the one in 02/2010" (09/25/2013)  . OSA on CPAP   . Type II diabetes mellitus     June 2014 AIC 6.2%/notes 09/24/2013; pt denies this hx on 09/25/2013  . Arthritis     "  knees" (09/25/2013)  . Rheumatoid arthritis(714.0)   . CVA (cerebral vascular accident) 02/2010    "lost peripheral vision when I had the stroke; some memory issues since" (09/25/2013)   BP 143/101  Pulse 88  Resp 14  Ht 6' (1.829 m)  Wt 263 lb (119.296 kg)  BMI 35.66 kg/m2  SpO2 97%  Opioid Risk Score:   Fall Risk Score: Moderate Fall Risk (6-13 points) (pt educated and given brochure on fall risk previously)    Review of Systems  All other systems reviewed and are negative.       Objective:   Physical Exam  General: Alert and oriented x 3, No apparent distress  HEENT: Head is normocephalic, atraumatic, PERRLA, EOMI, sclera anicteric, oral mucosa pink and moist, dentition intact, ext ear canals clear,  Neck: Supple without JVD or lymphadenopathy  Heart: Reg rate and rhythm. No murmurs rubs or gallops   Chest: CTA bilaterally without wheezes, rales, or rhonchi; no distress  Abdomen: Soft, non-tender, non-distended, bowel sounds positive.  Extremities: No clubbing, cyanosis, or edema. Pulses are 2+  Skin: Clean and intact without signs of breakdown  Neuro: Pt is cognitively appropriate with normal insight, memory, and awareness. Cranial nerves 2-12 are intact. Sensory exam is normal. Reflexes are 2+ in all 4's. Fine motor coordination is intact. No tremors. Motor function is grossly 5/5.  Musculoskeletal: Both legs remain very tender to touch. Effusions present around both knees. He has difficulty extending either knee agst gravity. He's antalgic on either leg. He walks with a wide based, straight legged gait to avoid knee bending and pressure.   Psych: Pt's affect is appropriate. Pt is cooperative. He is quite pleasant      1. Bilateral knee pain d/t endstage OA. He is in needs of knee replacements ultimately  -continue voltaren gel  -oxycontin CR--continue 40mg  bid #60 -increase to oxy ir 15 mg q8 prn for breakthrough pain #90  -have ordered bilateral OA knee braces to help with unloading of his knees 2. CAD    I'll have my NP/RN see him back in about a month. 15 minutes of face to face patient care time were spent during this visit. All questions were encouraged and answered.

## 2014-03-08 ENCOUNTER — Ambulatory Visit (INDEPENDENT_AMBULATORY_CARE_PROVIDER_SITE_OTHER): Payer: Medicare Other | Admitting: Internal Medicine

## 2014-03-08 ENCOUNTER — Encounter: Payer: Self-pay | Admitting: Internal Medicine

## 2014-03-08 VITALS — BP 166/92 | HR 91 | Ht 71.0 in | Wt 265.8 lb

## 2014-03-08 DIAGNOSIS — I6350 Cerebral infarction due to unspecified occlusion or stenosis of unspecified cerebral artery: Secondary | ICD-10-CM

## 2014-03-08 DIAGNOSIS — G4733 Obstructive sleep apnea (adult) (pediatric): Secondary | ICD-10-CM

## 2014-03-08 NOTE — Patient Instructions (Signed)
Your CPAP helps keep your blood pressure down and reduces your chance of having a heart attack or stroke. We will have our [facility] see what help you need working with [facility] to get started back on your CPAP.

## 2014-03-08 NOTE — Progress Notes (Signed)
03/18/13- 8 yoM former smsoker-Tony Dondra Spry sleep study 2012 [facility] ([place]) Neurology; has CPAP through [facility] but not using at this time; was told by hospital that he needed O2 at night as well and to let us know today. He falls asleep to easily when sitting quietly. Loud snoring and witnessed apneas. Chronically stuffy nose. No ENT surgery. NPSG 11/24/10- AHI 109.1/ hr, severe obstructive sleep apnea. CPAP was titrated to 14/AHI 0. Body weight was 237 pounds. He did not tolerate CPAP, feeling smothered, but continues to try to use it. He thinks he has gained 35 pounds since the study was done. Bedtime 8 to 9 PM with sleep latency 3-4 hours. Wakes 4 times for bathroom before finally up between 7 and 8 AM. Complicating medical problems include hypertension, coronary disease with cardiac stents, history of stroke. Tobacco abuse averaging one pack per day until quitting 03/10/2012. He was a school custodian, no longer working. Lives with his wife.  05/07/13-  89 yoM former smoker-Tony Dondra Spry sleep study 2012 [facility] Neurology; has CPAP through [facility] but not using at this time; was told by hospital that he needed O2 at night as well. Hx CAD/ stents, CVA FOLLOWS FOR: would like to change DME from [facility]-will need to discuss wit PCC's. Hopsital stay for heart since last visit. Not wearing CPAP-needs mask. Applying disability. CPAP titration 04/14/13 to 23/ AHI 0. Wore Resmed AirFit F10 medium. 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 See comments  above. The patient may benefit from a formal CT paranasal sinuses  and/or ENT consultation.  ENT consult 04/01/13-Wolicki- large turbinates.  Original Report Authenticated By: Tony Davis, M.D. CXR 04/22/13 IMPRESSION: No focal consolidation or definite radiographic  evidence of acute cardiopulmonary process.  Original Report  Authenticated By: Tony Davis, M.D. PFT --03/23/2013-normal spirometry flows with insignificant response to bronchodilator, slight restriction of total lung capacity, normal diffusion capacity.FVC 3.95/90%, FEV1 3.2 weight/93%, FEV1/FVC 83/103%, FEF 25-75% 3.42/98%. RV 87%, TLC 79%, DLCO 89%.  06/26/13- 78 yoM former smoker-Tony Dondra Spry sleep study 2012 [facility] Neurology; has CPAP through [facility] but not using at this time; was told by hospital that he needed O2 at night as well. Hx CAD/ stents, CVA 6 wk follow up.  Wearing cpap 20/ [facility] DME every night.  Breathing through mouth with mask on.  Wakes up with very dry mouth.using a fullface mask with humidifier. Download confirms good compliance and control. Complains of nasal congestion. History of known prominent turbinates.  08/13/13-  32 yoM former smoker--followed for OSA; complicated by  Hx CAD/ stents, CVA, DM, GERD     PCP Tony Davis FOLLOWS FOR: Wears CPAP 20/ APS every night for about 6-8 hours; feels like his throat is burning at times. Has gained 43 lbs since sleep study. Declines flu shot Still wakes in the morning with bad sore throat and feeling that he has to clear his throat. No bleeding on warfarin.  03/08/14- 77 yoM former smoker--followed for OSA; complicated by  Hx CAD/ stents, CVA, DM, GERD     PCP Tony Davis FOLLOWS FOR: Pt not using CPAP 20/ APS as he should every night;  He says he "got tired of taking medicine" and "quit bothering with CPAP". He complains of headache today and we measure BP 166/92. Discussed.  ROS-see HPI Constitutional:   No-   weight loss, night sweats, fevers, chills, +fatigue, lassitude. HEENT:   +  headaches, difficulty swallowing, tooth/dental problems, +sore throat,       No-  sneezing, itching, ear ache, +nasal congestion, post nasal drip,  CV:  No-   chest pain, orthopnea, PND, swelling in lower extremities, anasarca, dizziness, palpitations Resp: +  shortness of breath with exertion or  at rest.              No-   productive cough,  No non-productive cough,  No- coughing up of blood.              No-   change in color of mucus.  No- wheezing.   Skin: No-   rash or lesions. GI:  No-   heartburn, indigestion, abdominal pain, nausea, vomiting,  GU:  MS:  No-   joint pain or swelling.   Neuro-     nothing unusual Psych:  No- change in mood or affect. No depression or anxiety.  No memory loss.  OBJ- Physical Exam General- Alert, Oriented, Affect-appropriate, Distress- none acute. Overweight, +yawning Skin- rash-none, lesions- none, excoriation- none Lymphadenopathy- none Head- atraumatic            Eyes- Gross vision intact, PERRLA, conjunctivae and secretions clear            Ears- Hearing, canals-normal            Nose- turbinate hypertrophy, no-Septal dev, mucus, polyps, erosion, perforation             Throat- Mallampati III-IV , mucosa clear , drainage- none, tonsils- atrophic. Own teeth. Neck- flexible , trachea midline, no stridor , thyroid nl, carotid no bruit Chest - symmetrical excursion , unlabored           Heart/CV- RRR , no murmur , no gallop  , no rub, nl s1 s2                           - JVD- none , edema-none, stasis changes- none, varices- none           Lung- clear to P&A, wheeze- none, cough- none , dullness-none, rub- none           Chest wall-  Abd-  Br/ Gen/ Rectal- Not done, not indicated Extrem- cyanosis- none, clubbing, none, atrophy- none, strength- nl Neuro- grossly intact to observation

## 2014-03-10 ENCOUNTER — Ambulatory Visit (INDEPENDENT_AMBULATORY_CARE_PROVIDER_SITE_OTHER): Payer: Medicare Other | Admitting: *Deleted

## 2014-03-10 DIAGNOSIS — I6350 Cerebral infarction due to unspecified occlusion or stenosis of unspecified cerebral artery: Secondary | ICD-10-CM

## 2014-03-10 DIAGNOSIS — I82409 Acute embolism and thrombosis of unspecified deep veins of unspecified lower extremity: Secondary | ICD-10-CM

## 2014-03-10 DIAGNOSIS — Z7901 Long term (current) use of anticoagulants: Secondary | ICD-10-CM

## 2014-03-10 LAB — POCT INR: INR: 2.9

## 2014-03-26 ENCOUNTER — Ambulatory Visit: Payer: Medicare Other | Admitting: Physical Medicine & Rehabilitation

## 2014-03-29 ENCOUNTER — Encounter: Payer: Medicare Other | Attending: Physical Medicine & Rehabilitation | Admitting: Registered Nurse

## 2014-03-29 ENCOUNTER — Encounter: Payer: Self-pay | Admitting: Registered Nurse

## 2014-03-29 VITALS — BP 163/110 | HR 97 | Resp 14 | Ht 72.0 in | Wt 272.0 lb

## 2014-03-29 DIAGNOSIS — Z79899 Other long term (current) drug therapy: Secondary | ICD-10-CM

## 2014-03-29 DIAGNOSIS — Z5181 Encounter for therapeutic drug level monitoring: Secondary | ICD-10-CM

## 2014-03-29 DIAGNOSIS — I6350 Cerebral infarction due to unspecified occlusion or stenosis of unspecified cerebral artery: Secondary | ICD-10-CM

## 2014-03-29 DIAGNOSIS — M1710 Unilateral primary osteoarthritis, unspecified knee: Secondary | ICD-10-CM

## 2014-03-29 DIAGNOSIS — IMO0002 Reserved for concepts with insufficient information to code with codable children: Secondary | ICD-10-CM | POA: Insufficient documentation

## 2014-03-29 DIAGNOSIS — M1712 Unilateral primary osteoarthritis, left knee: Secondary | ICD-10-CM

## 2014-03-29 DIAGNOSIS — M1711 Unilateral primary osteoarthritis, right knee: Secondary | ICD-10-CM

## 2014-03-29 MED ORDER — OXYCODONE HCL 15 MG PO TABS: 15.0000 mg | ORAL_TABLET | Freq: Three times a day (TID) | ORAL | Status: DC | PRN

## 2014-03-29 MED ORDER — OXYCODONE HCL ER 40 MG PO T12A: 40.0000 mg | EXTENDED_RELEASE_TABLET | Freq: Two times a day (BID) | ORAL | Status: DC

## 2014-03-29 NOTE — Progress Notes (Signed)
Subjective:    Patient ID: Tony Davis, male    DOB: March 29, 1961, 53 y.o.   MRN: [id number]  HPI: [name] is a 53 year old male who returns for follow up for chronic pain and medication refill. He says his pain is bilateral knees. He rates his pain 8. He says pain medications helps.  He was nauseated and vomited this morning he says he had apple juice on an empty stomach. He was instructed to eat a meal to help alleviate this problem. He verbalized understanding. He is hypertensive this morning, he hasn't taken his anti-hypertensive's medications educated on the importance of being compliant with his medications. He verbalized understanding.   Pain Inventory Average Pain 8 Pain Right Now 8 My pain is intermittent and aching  In the last 24 hours, has pain interfered with the following? General activity 8 Relation with others 8 Enjoyment of life 8 What TIME of day is your pain at its worst? daytime, evening Sleep (in general) Poor  Pain is worse with: walking, bending, inactivity and standing Pain improves with: medication Relief from Meds: 8  Mobility walk with assistance use a cane transfers alone  Function disabled: date disabled na  Neuro/Psych No problems in this area  Prior Studies Any changes since last visit?  no  Physicians involved in your care Any changes since last visit?  no   Family History  Problem Relation Age of Onset  . Cancer Neg Hx   . Kidney disease Neg Hx   . Diabetes Neg Hx    History   Social History  . Marital Status: Married    Spouse Name: N/A    Number of Children: N/A  . Years of Education: N/A   Social History Main Topics  . Smoking status: Former Smoker -- 1.00 packs/day for 35 years    Types: Cigarettes    Quit date: 03/10/2012  . Smokeless tobacco: Never Used  . Alcohol Use: No  . Drug Use: No  . Sexual Activity: Yes   Other Topics Concern  . None   Social History Narrative   Married, 5 kids; lives  in [place]. 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    Past Surgical History  Procedure Laterality Date  . Knee arthroscopy Bilateral 2000's    2 on left and 3 on rt  . Tee without cardioversion  03/21/2012    Procedure: TRANSESOPHAGEAL ECHOCARDIOGRAM (TEE);  Surgeon: Fay Records, MD;  Location: [facility] ENDOSCOPY;  Service: Cardiovascular;  Laterality: N/A;  . Coronary angioplasty with stent placement  2012    "1" (09/24/2013)   Past Medical History  Diagnosis Date  . GERD (gastroesophageal reflux disease)   . HLD (hyperlipidemia)   . HTN (hypertension)   . Hematuria 8/12  . PFO (patent foramen ovale)     echo 8/12: EF 55-65%, grade 2 diast dysfnx; +PFO on bubble study;  TEE 4/13: EF normal, atrial septum with suspicion for interatrial septum fenestrations without flow across and few large bubbles noted in LA.  This was not felt to require closure   . CAD (coronary artery disease)     a. s/p promus DES circumflex artery 10/13/10. b. Cath 04/2013: stable disease but possible small vessel disease -Imdur added.  Marland Kitchen DVT (deep venous thrombosis) 2013    "right"   . TIA (transient ischemic attack)     "1-2; both after the one in 02/2010" (09/25/2013)  . OSA on CPAP   .  Type II diabetes mellitus     June 2014 AIC 6.2%/notes 09/24/2013; pt denies this hx on 09/25/2013  . Arthritis     "knees" (09/25/2013)  . Rheumatoid arthritis(714.0)   . CVA (cerebral vascular accident) 02/2010    "lost peripheral vision when I had the stroke; some memory issues since" (09/25/2013)   BP 163/110  Pulse 97  Resp 14  Ht 6' (1.829 m)  Wt 272 lb (123.378 kg)  BMI 36.88 kg/m2  SpO2 97%  Opioid Risk Score:   Fall Risk Score: Moderate Fall Risk (6-13 points) (pt educated and given brochure on fall risk previously)   Review of Systems  Musculoskeletal: Positive for back pain.  All other systems reviewed and are negative.      Objective:   Physical Exam  Nursing note and vitals  reviewed. Constitutional: He is oriented to person, place, and time. He appears well-developed and well-nourished.  HENT:  Head: Normocephalic.  Neck: Normal range of motion. Neck supple.  Cardiovascular: Normal rate and regular rhythm.   Pulmonary/Chest: Effort normal and breath sounds normal.  Musculoskeletal:  Normal Muscle Bulk: Muscle Testing Reveals: Upper extremities: Right arm with Full ROM. Left arm decreased ROM: 45 degrees Bilateral Knees with tenderness noted: Pattela, Medial and Lateral Joint Line Narrow Gait/ Walks with Cane  Neurological: He is alert and oriented to person, place, and time.  Skin: Skin is warm and dry.  Psychiatric: He has a normal mood and affect.          Assessment & Plan:  1. Bilateral knee pain d/t endstage OA. Awaiting TKR. Dr. Earleen Newport. He says he's not ready to have the surgery at this moment. Refilled: OxyCODONE 40mg  one tablet every 12 hrs #60 and oxyCodone 15 mg every 8 hours as needed #90, continue voltaren gel. 2. HTN: Encourage Medication Compliance: PMD Following  20 minutes of face to face patient care time was spent during this visit. All questions were encouraged and answered.

## 2014-03-31 ENCOUNTER — Ambulatory Visit (INDEPENDENT_AMBULATORY_CARE_PROVIDER_SITE_OTHER): Payer: Medicare Other | Admitting: Pharmacist

## 2014-03-31 ENCOUNTER — Ambulatory Visit (INDEPENDENT_AMBULATORY_CARE_PROVIDER_SITE_OTHER): Payer: Medicare Other | Admitting: Cardiovascular Disease

## 2014-03-31 VITALS — BP 160/118

## 2014-03-31 DIAGNOSIS — I10 Essential (primary) hypertension: Secondary | ICD-10-CM

## 2014-03-31 DIAGNOSIS — I6350 Cerebral infarction due to unspecified occlusion or stenosis of unspecified cerebral artery: Secondary | ICD-10-CM | POA: Diagnosis not present

## 2014-03-31 DIAGNOSIS — I82409 Acute embolism and thrombosis of unspecified deep veins of unspecified lower extremity: Secondary | ICD-10-CM

## 2014-03-31 DIAGNOSIS — Z7901 Long term (current) use of anticoagulants: Secondary | ICD-10-CM

## 2014-03-31 LAB — POCT INR: INR: 5.2

## 2014-03-31 MED ORDER — CLONIDINE HCL 0.1 MG PO TABS: 0.1000 mg | ORAL_TABLET | Freq: Two times a day (BID) | ORAL | Status: DC

## 2014-03-31 MED ORDER — CLONIDINE HCL 0.1 MG PO TABS
0.1000 mg | ORAL_TABLET | Freq: Once | ORAL | Status: AC
  Administered 2014-03-31: 0.1 mg via ORAL

## 2014-03-31 NOTE — Patient Instructions (Addendum)
Your physician recommends that you schedule a follow-up appointment in:  7-10 days with NP or PA.  Your physician has recommended you make the following change in your medication:   Start Clonidine 0.1 mg by mouth twice daily-- AM and bedtime. Start taking Coreg every morning and at bedtime.   Take dose of this when you get home today.

## 2014-03-31 NOTE — Progress Notes (Signed)
1.) Reason for visit:  Pt here for Coumadin clinic appt.  2.) Name of MD requesting visit:  Dr. Angelena Form  3.) H&P:  History of CAD, hypertension, hyperlipidemia, PFO, TIA, DVT  4.) ROS related to problem:  Pt here for coumadin clinic appt and reported he had headache and felt blood pressure was elevated. Blood pressure checked by coumadin clinic and was 162/120.  Rechecked by me and was 160/118.  Pt reports he has had headache for a few days.  Has not taken anything for it.  He is taking all medications as listed on med list. He reports he has not taken medications this AM.  Usually takes Coreg at noon and 6 PM. Takes Avapro and Imdur at bedtime.    5.) Assessment and plan per MD: Reviewed with Dr. Burt Knack. Recommendations-                   Give clonidine 0.1 mg by mouth now.                   Repeat BP in 30 minutes.                    Start taking Coreg in AM and at bedtime.                   Start Clonidine 0.1 mg by mouth twice daily                   Schedule office visit with NP or PA for 7-10 days.   6.) Provider sign-of(MD statement): agree with documentation as outlined. Treatment was under my direction in this patient with longstanding poorly controlled HTN.      Blood pressure rechecked at 10:50--158/118. Clonidine 0.1 mg by mouth given.    Blood pressure rechecked at 11:25--152/100 in right arm. 162/94 in left.  Pt reports headache is gone.   Pt given copy of discharge instructions.

## 2014-04-02 ENCOUNTER — Encounter: Payer: Self-pay | Admitting: Cardiovascular Disease

## 2014-04-03 NOTE — Assessment & Plan Note (Addendum)
Fatalistic and he may be depressed, not wanting to take medicines or CPAP. Noncompliant. I reminded him of relation between sleep apnea and stroke, hypertension and his headache. Plan-contact DME to help him get started back with his CPAP.

## 2014-04-08 ENCOUNTER — Ambulatory Visit (INDEPENDENT_AMBULATORY_CARE_PROVIDER_SITE_OTHER): Payer: Medicare Other

## 2014-04-08 DIAGNOSIS — Z7901 Long term (current) use of anticoagulants: Secondary | ICD-10-CM

## 2014-04-08 DIAGNOSIS — I6350 Cerebral infarction due to unspecified occlusion or stenosis of unspecified cerebral artery: Secondary | ICD-10-CM | POA: Diagnosis not present

## 2014-04-08 DIAGNOSIS — I82409 Acute embolism and thrombosis of unspecified deep veins of unspecified lower extremity: Secondary | ICD-10-CM

## 2014-04-08 LAB — POCT INR: INR: 2.8

## 2014-04-12 ENCOUNTER — Ambulatory Visit (INDEPENDENT_AMBULATORY_CARE_PROVIDER_SITE_OTHER): Payer: Medicare Other | Admitting: Physician Assistant

## 2014-04-12 ENCOUNTER — Encounter: Payer: Self-pay | Admitting: Physician Assistant

## 2014-04-12 VITALS — BP 157/112 | HR 90 | Ht 72.0 in | Wt 271.8 lb

## 2014-04-12 DIAGNOSIS — I2510 Atherosclerotic heart disease of native coronary artery without angina pectoris: Secondary | ICD-10-CM | POA: Diagnosis not present

## 2014-04-12 DIAGNOSIS — I10 Essential (primary) hypertension: Secondary | ICD-10-CM | POA: Diagnosis not present

## 2014-04-12 MED ORDER — CLONIDINE HCL 0.2 MG PO TABS: 0.2000 mg | ORAL_TABLET | Freq: Two times a day (BID) | ORAL | Status: DC

## 2014-04-12 NOTE — Patient Instructions (Addendum)
Your physician has recommended you make the following change in your medication:  1. INCREASE  YOUR CLONIDINE 0.2 MG TWICE DAILY  Your physician recommends that you schedule a follow-up appointment in: [place]  .

## 2014-04-12 NOTE — Assessment & Plan Note (Addendum)
Patient's blood pressure remains significantly elevated. He has not taken his medications today. H brought his medications from the car and we will give him all of his medications including an extra clonidine. I will increase his clonidine to 0.2 mg twice a day. After we gave him his medications his blood pressure was 128/86. He will need to come back for blood pressure check in 7-10 days after he takes his medications. Followup with Dr. Angelena Form in 2 weeks.

## 2014-04-12 NOTE — Assessment & Plan Note (Signed)
Stable

## 2014-04-12 NOTE — Progress Notes (Signed)
HPI: This is a 53 year old male patient of Dr. Angelena Form who is here for blood pressure followup. He was in the Coumadin clinic last week and reported a headache and felt his blood pressure was high it was 162/120. He hasn't taken his medications that day. Dr. Burt Knack saw him and gave him clonidine 0.1 mg in the office and then start him on 0.1 mg twice a day and in addition to his other medicine.  Patient comes in today for repeat blood pressure check. He did not take any of his medications this morning before he came in. It is now 1:00 in the afternoon and he's had no medication and his blood pressure is 157/112. He claims he usually sleeps all morning because he can't sleep at night. He usually only takes his first medicines about 1:30 in the afternoon. He says his blood pressures have been running high at home as well. He denies excessive sodium and says he watches it closely.  Patient has history of CAD status post PCI and drug-eluting stent to the circumflex in 2011. Last cardiac catheterization on 04/24/13 with chills stable CAD. Had a patent stent in the distal circumflex, mild disease in the LAD and moderate disease in the RCA. He's also had a PFO on echo. TEE performed in 2013 EF was normal atrial septum was suspicious for intra-atrial septum fenestrations without flow across and few large bubbles noted in the LAD. This was not felt to require closure. He's been maintained on Coumadin and followed in our Coumadin clinic. He also has obstructive sleep apnea, hyperlipidemia, and obesity.  Allergies -- Iodine    --  REACTION: anaphylaxis  -- Amlodipine    --  Foot edema  Current Outpatient Prescriptions on File Prior to Visit: acetaminophen (TYLENOL) 160 MG/5ML liquid, Take 160 mg by mouth every 4 (four) hours as needed for fever., Disp: , Rfl:  aspirin EC 81 MG EC tablet, Take 1 tablet (81 mg total) by mouth daily., Disp: , Rfl:  atorvastatin (LIPITOR) 80 MG tablet, Take 1 tablet (80 mg total) by  mouth at bedtime., Disp: 90 tablet, Rfl: 1 carvedilol (COREG) 25 MG tablet, Take 1 tablet (25 mg total) by mouth 2 (two) times daily with a meal., Disp: 180 tablet, Rfl: 3 cloNIDine (CATAPRES) 0.1 MG tablet, Take 1 tablet (0.1 mg total) by mouth 2 (two) times daily., Disp: 60 tablet, Rfl: 6 folic acid (FOLVITE) 1 MG tablet, Take 1 mg by mouth daily., Disp: , Rfl:  furosemide (LASIX) 20 MG tablet, Take 20 mg by mouth daily., Disp: , Rfl:  irbesartan (AVAPRO) 300 MG tablet, Take 1 tablet (300 mg total) by mouth daily., Disp: 90 tablet, Rfl: 3 isosorbide mononitrate (IMDUR) 30 MG 24 hr tablet, TAKE 1 TABLET BY MOUTH AT BEDTIME, Disp: 30 tablet, Rfl: 11 mometasone (NASONEX) 50 MCG/ACT nasal spray, Place 2 sprays into both nostrils daily as needed (allergies). To each nostril, Disp: , Rfl:  nitroGLYCERIN (NITROSTAT) 0.4 MG SL tablet, Place 1 tablet (0.4 mg total) under the tongue every 5 (five) minutes as needed for chest pain (up to 3 doses)., Disp: 25 tablet, Rfl: 3 OxyCODONE (OXYCONTIN) 40 mg T12A 12 hr tablet, Take 1 tablet (40 mg total) by mouth every 12 (twelve) hours., Disp: 60 tablet, Rfl: 0 oxyCODONE (ROXICODONE) 15 MG immediate release tablet, Take 1 tablet (15 mg total) by mouth every 8 (eight) hours as needed for pain., Disp: 90 tablet, Rfl: 0 pantoprazole (PROTONIX) 40 MG tablet, Take 1 tablet (40 mg total)  by mouth 2 (two) times daily before a meal., Disp: 60 tablet, Rfl: 5 potassium chloride SA (KLOR-CON M20) 20 MEQ tablet, Take 4 tablets (80 mEq total) by mouth 2 (two) times daily., Disp: 224 tablet, Rfl: 11 warfarin (COUMADIN) 7.5 MG tablet, Take 7.5 mg by mouth daily., Disp: , Rfl:   No current facility-administered medications on file prior to visit.   Past Medical History:   GERD (gastroesophageal reflux disease)                       HLD (hyperlipidemia)                                         HTN (hypertension)                                           Hematuria                                        8/12         PFO (patent foramen ovale)                                     Comment:echo 8/12: EF 55-65%, grade 2 diast dysfnx;               +PFO on bubble study;  TEE 4/13: EF normal,               atrial septum with suspicion for interatrial               septum fenestrations without flow across and               few large bubbles noted in LA.  This was not               felt to require closure    CAD (coronary artery disease)                                  Comment:a. s/p promus DES circumflex artery 10/13/10.               b. Cath 04/2013: stable disease but possible               small vessel disease -Imdur added.   DVT (deep venous thrombosis)                    2013           Comment:"right"    TIA (transient ischemic attack)                                Comment:"1-2; both after the one in 02/2010"               (09/25/2013)   OSA on CPAP  Type II diabetes mellitus                                      Comment:June 2014 AIC 6.2%/notes 09/24/2013; pt denies               this hx on 09/25/2013   Arthritis                                                      Comment:"knees" (09/25/2013)   Rheumatoid arthritis(714.0)                                  CVA (cerebral vascular accident)                02/2010         Comment:"lost peripheral vision when I had the stroke;               some memory issues since" (09/25/2013)  Past Surgical History:   KNEE ARTHROSCOPY                                Bilateral 2000's         Comment:2 on left and 3 on rt   TEE WITHOUT CARDIOVERSION                        03/21/2012      Comment:Procedure: TRANSESOPHAGEAL ECHOCARDIOGRAM               (TEE);  Surgeon: Fay Records, MD;  Location:               [place];  Service: Cardiovascular;                Laterality: N/A;   CORONARY ANGIOPLASTY WITH STENT PLACEMENT        2012           Comment:"1" (09/24/2013)  Review of  patient's family history indicates:   Cancer                         Neg Hx                   Kidney disease                 Neg Hx                   Diabetes                       Neg Hx                   Social History   Marital Status: Married             Spouse Name:                      Years of Education:                 Number of children:  Occupational History   None on file  Social History Main Topics   Smoking Status: Former Smoker                   Packs/Day: 1.00  Years: 35        Types: Cigarettes     Quit date: 03/10/2012   Smokeless Status: Never Used                       Alcohol Use: No             Drug Use: No             Sexual Activity: Yes                Other Topics            Concern   None on file  Social History Narrative   Married, 5 kids; lives in [place]. 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     ROS:   PHYSICAL EXAM: Well-nournished, in no acute distress. Neck: No JVD, HJR, Bruit, or thyroid enlargement  Lungs: No tachypnea, clear without wheezing, rales, or rhonchi  Cardiovascular: RRR, PMI not displaced, heart sounds normal, no murmurs, gallops, bruit, thrill, or heave.  Abdomen: BS normal. Soft without organomegaly, masses, lesions or tenderness.  Extremities: without cyanosis, clubbing or edema. Good distal pulses bilateral  SKin: Warm, no lesions or rashes   Musculoskeletal: No deformities  Neuro: no focal signs  There were no vitals taken for this visit.   EKG:  Cardiac cath 04/24/13: Left main: No obstructive disease noted.   Left Anterior Descending Artery: Large caliber vessel that does not reach the apex. There are minor luminal irregularities in the mid vessel. The diagonal branch is moderate in caliber and has no obstructive disease.   Circumflex Artery: Large caliber vessel with moderate caliber intermediate branch followed by two obtuse marginal branches. The intermediate branch has minor  plaque disease. The two obtuse marginal branches have minor plaque disease. The stent in the distal AV groove Circumflex is patent with no restenosis.   Right Coronary Artery: Large caliber, dominant vessel that wraps around the apex. The proximal vessel has minor luminal irregularities. The mid vessel has diffuse 30% stenosis. The PDA is very large and reaches the apex. The PDA has minor plaque disease. The Posterolateral branch is a moderate caliber bifurcating vessel. The ostium of the posterolateral branch has 40% stenosis. The distal portion of this vessel become small in caliber then bifurcates and there is a 70% stenosis at the bifurcation involving both the superior and inferior sub-branches. This is unchanged in appearance since his last cath in 2011. This vessel is too small for PCI.   Left Ventricular Angiogram: LVEF=55-60%.

## 2014-04-14 ENCOUNTER — Encounter (HOSPITAL_COMMUNITY): Payer: Self-pay | Admitting: Emergency Medicine

## 2014-04-14 ENCOUNTER — Observation Stay (HOSPITAL_COMMUNITY)
Admission: EM | Admit: 2014-04-14 | Discharge: 2014-04-16 | Disposition: A | Discharge status: Home / Self Care | Payer: Medicare Other | Attending: Internal Medicine | Admitting: Internal Medicine

## 2014-04-14 DIAGNOSIS — E785 Hyperlipidemia, unspecified: Secondary | ICD-10-CM | POA: Diagnosis not present

## 2014-04-14 DIAGNOSIS — I82409 Acute embolism and thrombosis of unspecified deep veins of unspecified lower extremity: Secondary | ICD-10-CM | POA: Diagnosis present

## 2014-04-14 DIAGNOSIS — I6350 Cerebral infarction due to unspecified occlusion or stenosis of unspecified cerebral artery: Secondary | ICD-10-CM

## 2014-04-14 DIAGNOSIS — I2510 Atherosclerotic heart disease of native coronary artery without angina pectoris: Secondary | ICD-10-CM | POA: Diagnosis not present

## 2014-04-14 DIAGNOSIS — G4733 Obstructive sleep apnea (adult) (pediatric): Secondary | ICD-10-CM | POA: Diagnosis not present

## 2014-04-14 DIAGNOSIS — Z8673 Personal history of transient ischemic attack (TIA), and cerebral infarction without residual deficits: Secondary | ICD-10-CM | POA: Diagnosis not present

## 2014-04-14 DIAGNOSIS — I951 Orthostatic hypotension: Principal | ICD-10-CM | POA: Diagnosis present

## 2014-04-14 DIAGNOSIS — Z9861 Coronary angioplasty status: Secondary | ICD-10-CM | POA: Diagnosis not present

## 2014-04-14 DIAGNOSIS — Z86718 Personal history of other venous thrombosis and embolism: Secondary | ICD-10-CM | POA: Diagnosis not present

## 2014-04-14 DIAGNOSIS — Z7982 Long term (current) use of aspirin: Secondary | ICD-10-CM | POA: Insufficient documentation

## 2014-04-14 DIAGNOSIS — Z87891 Personal history of nicotine dependence: Secondary | ICD-10-CM | POA: Insufficient documentation

## 2014-04-14 DIAGNOSIS — R404 Transient alteration of awareness: Secondary | ICD-10-CM | POA: Diagnosis not present

## 2014-04-14 DIAGNOSIS — D689 Coagulation defect, unspecified: Secondary | ICD-10-CM | POA: Diagnosis not present

## 2014-04-14 DIAGNOSIS — I10 Essential (primary) hypertension: Secondary | ICD-10-CM | POA: Diagnosis not present

## 2014-04-14 DIAGNOSIS — R55 Syncope and collapse: Secondary | ICD-10-CM | POA: Diagnosis present

## 2014-04-14 DIAGNOSIS — M069 Rheumatoid arthritis, unspecified: Secondary | ICD-10-CM | POA: Insufficient documentation

## 2014-04-14 DIAGNOSIS — K219 Gastro-esophageal reflux disease without esophagitis: Secondary | ICD-10-CM | POA: Diagnosis present

## 2014-04-14 DIAGNOSIS — Z7901 Long term (current) use of anticoagulants: Secondary | ICD-10-CM

## 2014-04-14 DIAGNOSIS — T50905A Adverse effect of unspecified drugs, medicaments and biological substances, initial encounter: Secondary | ICD-10-CM | POA: Diagnosis present

## 2014-04-14 NOTE — ED Notes (Addendum)
Per EMS: pt had a syncopal episode this afternoon. Pt went to fire department and had BP checked and it was 118 palpated which is low for him. Pt states that he feels fine and wants to go home. Pt given 278ml bolus with EMS.

## 2014-04-15 ENCOUNTER — Emergency Department (HOSPITAL_COMMUNITY): Payer: Medicare Other

## 2014-04-15 DIAGNOSIS — T50905A Adverse effect of unspecified drugs, medicaments and biological substances, initial encounter: Secondary | ICD-10-CM | POA: Diagnosis present

## 2014-04-15 DIAGNOSIS — E785 Hyperlipidemia, unspecified: Secondary | ICD-10-CM | POA: Diagnosis not present

## 2014-04-15 DIAGNOSIS — I10 Essential (primary) hypertension: Secondary | ICD-10-CM | POA: Diagnosis not present

## 2014-04-15 DIAGNOSIS — I951 Orthostatic hypotension: Secondary | ICD-10-CM | POA: Diagnosis not present

## 2014-04-15 DIAGNOSIS — R55 Syncope and collapse: Secondary | ICD-10-CM

## 2014-04-15 DIAGNOSIS — G4733 Obstructive sleep apnea (adult) (pediatric): Secondary | ICD-10-CM

## 2014-04-15 DIAGNOSIS — T887XXA Unspecified adverse effect of drug or medicament, initial encounter: Secondary | ICD-10-CM

## 2014-04-15 DIAGNOSIS — Z7901 Long term (current) use of anticoagulants: Secondary | ICD-10-CM | POA: Diagnosis not present

## 2014-04-15 LAB — URINALYSIS, ROUTINE W REFLEX MICROSCOPIC
GLUCOSE, UA: NEGATIVE mg/dL
Ketones, ur: 15 mg/dL — AB
Nitrite: NEGATIVE
Protein, ur: 30 mg/dL — AB
Specific Gravity, Urine: 1.022 (ref 1.005–1.030)
Urobilinogen, UA: 1 mg/dL (ref 0.0–1.0)
pH: 5.5 (ref 5.0–8.0)

## 2014-04-15 LAB — CBC
HEMATOCRIT: 40.2 % (ref 39.0–52.0)
HEMOGLOBIN: 13.5 g/dL (ref 13.0–17.0)
MCH: 29.9 pg (ref 26.0–34.0)
MCHC: 33.6 g/dL (ref 30.0–36.0)
MCV: 89.1 fL (ref 78.0–100.0)
Platelets: 294 10*3/uL (ref 150–400)
RBC: 4.51 MIL/uL (ref 4.22–5.81)
RDW: 12.6 % (ref 11.5–15.5)
WBC: 11.2 10*3/uL — ABNORMAL HIGH (ref 4.0–10.5)

## 2014-04-15 LAB — URINE MICROSCOPIC-ADD ON

## 2014-04-15 LAB — COMPREHENSIVE METABOLIC PANEL
ALT: 23 U/L (ref 0–53)
AST: 24 U/L (ref 0–37)
Albumin: 3.7 g/dL (ref 3.5–5.2)
Alkaline Phosphatase: 139 U/L — ABNORMAL HIGH (ref 39–117)
BILIRUBIN TOTAL: 0.4 mg/dL (ref 0.3–1.2)
BUN: 15 mg/dL (ref 6–23)
CHLORIDE: 104 meq/L (ref 96–112)
CO2: 27 meq/L (ref 19–32)
CREATININE: 1.2 mg/dL (ref 0.50–1.35)
Calcium: 8.8 mg/dL (ref 8.4–10.5)
GFR calc non Af Amer: 69 mL/min — ABNORMAL LOW (ref >90)
GFR, EST AFRICAN AMERICAN: 80 mL/min — AB (ref >90)
GLUCOSE: 154 mg/dL — AB (ref 70–99)
Potassium: 3.6 mEq/L — ABNORMAL LOW (ref 3.7–5.3)
Sodium: 142 mEq/L (ref 137–147)
Total Protein: 7.3 g/dL (ref 6.0–8.3)

## 2014-04-15 LAB — PROTIME-INR
INR: 2.57 — AB (ref 0.00–1.49)
PROTHROMBIN TIME: 26.7 s — AB (ref 11.6–15.2)

## 2014-04-15 LAB — GLUCOSE, CAPILLARY: Glucose-Capillary: 162 mg/dL — ABNORMAL HIGH (ref 70–99)

## 2014-04-15 LAB — I-STAT CG4 LACTIC ACID, ED: Lactic Acid, Venous: 1.42 mmol/L (ref 0.5–2.2)

## 2014-04-15 LAB — TROPONIN I: Troponin I: <0.3 ng/mL (ref <0.30)

## 2014-04-15 MED ORDER — SODIUM CHLORIDE 0.9 % IV SOLN
INTRAVENOUS | Status: DC
  Administered 2014-04-15: 01:00:00 via INTRAVENOUS

## 2014-04-15 MED ORDER — ASPIRIN EC 81 MG PO TBEC
81.0000 mg | DELAYED_RELEASE_TABLET | Freq: Every day | ORAL | Status: DC
  Administered 2014-04-15 – 2014-04-16 (×2): 81 mg via ORAL
  Filled 2014-04-15 (×2): qty 1

## 2014-04-15 MED ORDER — NITROGLYCERIN 0.4 MG SL SUBL: 0.4000 mg | SUBLINGUAL_TABLET | SUBLINGUAL | Status: DC | PRN

## 2014-04-15 MED ORDER — WARFARIN SODIUM 7.5 MG PO TABS
7.5000 mg | ORAL_TABLET | Freq: Every day | ORAL | Status: DC
  Administered 2014-04-15: 7.5 mg via ORAL
  Filled 2014-04-15 (×2): qty 1

## 2014-04-15 MED ORDER — POTASSIUM CHLORIDE CRYS ER 20 MEQ PO TBCR
40.0000 meq | EXTENDED_RELEASE_TABLET | Freq: Once | ORAL | Status: AC
  Administered 2014-04-15: 40 meq via ORAL
  Filled 2014-04-15: qty 2

## 2014-04-15 MED ORDER — ONDANSETRON HCL 4 MG PO TABS: 4.0000 mg | ORAL_TABLET | Freq: Four times a day (QID) | ORAL | Status: DC | PRN

## 2014-04-15 MED ORDER — CARVEDILOL 25 MG PO TABS
25.0000 mg | ORAL_TABLET | Freq: Two times a day (BID) | ORAL | Status: DC
  Administered 2014-04-15 – 2014-04-16 (×2): 25 mg via ORAL
  Filled 2014-04-15 (×4): qty 1

## 2014-04-15 MED ORDER — SODIUM CHLORIDE 0.9 % IV BOLUS (SEPSIS): 1000.0000 mL | Freq: Once | INTRAVENOUS | Status: DC

## 2014-04-15 MED ORDER — CARVEDILOL 25 MG PO TABS
25.0000 mg | ORAL_TABLET | Freq: Two times a day (BID) | ORAL | Status: DC
  Filled 2014-04-15 (×2): qty 1

## 2014-04-15 MED ORDER — IRBESARTAN 300 MG PO TABS
300.0000 mg | ORAL_TABLET | Freq: Every day | ORAL | Status: DC
  Administered 2014-04-15 – 2014-04-16 (×2): 300 mg via ORAL
  Filled 2014-04-15 (×2): qty 1

## 2014-04-15 MED ORDER — SODIUM CHLORIDE 0.9 % IV SOLN
INTRAVENOUS | Status: AC
  Administered 2014-04-15: 03:00:00 via INTRAVENOUS

## 2014-04-15 MED ORDER — ONDANSETRON HCL 4 MG/2ML IJ SOLN: 4.0000 mg | Freq: Four times a day (QID) | INTRAMUSCULAR | Status: DC | PRN

## 2014-04-15 MED ORDER — FOLIC ACID 1 MG PO TABS
1.0000 mg | ORAL_TABLET | Freq: Every day | ORAL | Status: DC
  Administered 2014-04-15 – 2014-04-16 (×2): 1 mg via ORAL
  Filled 2014-04-15 (×2): qty 1

## 2014-04-15 MED ORDER — ATORVASTATIN CALCIUM 80 MG PO TABS
80.0000 mg | ORAL_TABLET | Freq: Every day | ORAL | Status: DC
  Administered 2014-04-15: 80 mg via ORAL
  Filled 2014-04-15 (×2): qty 1

## 2014-04-15 MED ORDER — WARFARIN - PHYSICIAN DOSING INPATIENT: Freq: Every day | Status: DC

## 2014-04-15 MED ORDER — PANTOPRAZOLE SODIUM 40 MG PO TBEC
40.0000 mg | DELAYED_RELEASE_TABLET | Freq: Two times a day (BID) | ORAL | Status: DC
  Administered 2014-04-15 – 2014-04-16 (×3): 40 mg via ORAL
  Filled 2014-04-15 (×3): qty 1

## 2014-04-15 MED ORDER — OXYCODONE HCL ER 20 MG PO T12A
40.0000 mg | EXTENDED_RELEASE_TABLET | Freq: Two times a day (BID) | ORAL | Status: DC
  Filled 2014-04-15: qty 4

## 2014-04-15 NOTE — Progress Notes (Signed)
Occupational Therapy Evaluation and Discharge Patient Details Name: Tony Davis MRN: [id number] DOB: Jan 19, 1961 Today's Date: 04/15/2014    History of Present Illness 53 yo male h/o cva, htn uncontrolled, has recently had his clonodine doubled due to uncontrolled bp has been having dizziness upon moving. Pt with syncopal episode on 04/14/14 while sitting in passenger side seat of car and taken to hospital by EMT.   Clinical Impression   PTA pt lived at home with family and needed assistance with LB ADLs, specifically donning/doffing shoes and socks. Pt declined OOB activities, however per PT note pt ambulating with Supervision and RW. Education and training completed with pt for LB dressing with use of AE, fall prevention strategies, and safety during ADLs. No further acute OT needs.     Follow Up Recommendations  No OT follow up;Supervision - Intermittent    Equipment Recommendations  None recommended by OT       Precautions / Restrictions Precautions Precautions: Fall Precaution Comments: syncope possibly from orthostatic hypotension Restrictions Weight Bearing Restrictions: No      Mobility Bed Mobility               General bed mobility comments: Not assessed- pt in recliner and declined OOB activity. Per PT note, pt at mod I level.  Transfers                 General transfer comment: Not assessed- pt in recliner and declined OOB activity. Per PT note, pt at mod I level.         ADL Overall ADL's : Modified independent                                       General ADL Comments: Pt demonstrated with return demonstration of reacher, sock aid, and long handled shoe horn for LB ADLs.         Perception Perception Perception Tested?: No   Praxis Praxis Praxis tested?: Within functional limits    Pertinent Vitals/Pain No c/o pain.     Hand Dominance Right   Extremity/Trunk Assessment Upper Extremity Assessment Upper Extremity  Assessment: LUE deficits/detail;Generalized weakness LUE Deficits / Details: impaired shoulder ROM  to 90 degrees (from past CVA- left sided weakness).   Lower Extremity Assessment Lower Extremity Assessment: Generalized weakness   Cervical / Trunk Assessment Cervical / Trunk Assessment: Normal   Communication Communication Communication: No difficulties   Cognition Arousal/Alertness: Awake/alert Behavior During Therapy: WFL for tasks assessed/performed Overall Cognitive Status: Within Functional Limits for tasks assessed                     General Comments    Pt reports no dizziness in sitting. Educated pt on safety during functional mobility and during ADLs and identifying signs and symptoms of syncope. Educated pt on fall prevention strategies and energy conservation techniques.            Home Living Family/patient expects to be discharged to:: Private residence Living Arrangements: Spouse/significant other;Children Available Help at Discharge: Family Type of Home: House Home Access: Stairs to enter Technical brewer of Steps: 2 Entrance Stairs-Rails: Right;Left Home Layout: One level     Bathroom Shower/Tub: Tub/shower unit Shower/tub characteristics: Curtain Biochemist, clinical: [place]: Environmental consultant - 2 wheels;Cane - single point;Bedside commode          Prior Functioning/Environment Level of Independence:  Needs assistance  Gait / Transfers Assistance Needed: RW and cane with ambulation ADL's / Homemaking Assistance Needed: Pt reports difficulty with LB ADLs due to knee OA                                  End of Session Equipment Utilized During Treatment: Other (comment) (reacher, sock aid, long handled shoe horn)  Activity Tolerance: Patient tolerated treatment well Patient left: in chair;with call bell/phone within reach   Time: 1527-1550 OT Time Calculation (min): 23 min Charges:  OT General Charges $OT Visit: 1  Procedure OT Evaluation $Initial OT Evaluation Tier I: 1 Procedure OT Treatments $Self Care/Home Management : 8-22 mins G-Codes: OT G-codes **NOT FOR INPATIENT CLASS** Functional Assessment Tool Used: clinical judgement Functional Limitation: Self care Self Care Current Status (T5573): At least 1 percent but less than 20 percent impaired, limited or restricted Self Care Goal Status (U2025): At least 1 percent but less than 20 percent impaired, limited or restricted Self Care Discharge Status [phone]): At least 1 percent but less than 20 percent impaired, limited or restricted   Juluis Rainier [phone] 04/15/2014, 4:23 PM

## 2014-04-15 NOTE — Progress Notes (Signed)
UR completed 

## 2014-04-15 NOTE — ED Notes (Signed)
Admitting MD at bedside.

## 2014-04-15 NOTE — H&P (Signed)
PCP:   [name], MD   Chief Complaint:  Passed out  HPI: 53 yo male h/o cva, htn uncontrolled, has recently 2 days ago had his clonodine doubled due to uncontrolled bp has been having dizziness upon moving all day today when it got so bad when trying to get into his house from the car with his wife that he passed out.  EMS was called and his bp was low on their arrival.  In ED his sbp initially was less than 100.  Pt says his bp is never this low.  No focal neuro deficits.  No recent fevers.  No n/v/d.  He is still feeling very weak.  No cp.  No sob.  No le edema or swelling.  He has received at least one liter of ivf, unknown amt of fluids given by EMS.  Pt feels better since arrival to ED.  Review of Systems:  Positive and negative as per HPI otherwise all other systems are negative  Past Medical History: Past Medical History  Diagnosis Date  . GERD (gastroesophageal reflux disease)   . HLD (hyperlipidemia)   . HTN (hypertension)   . Hematuria 8/12  . PFO (patent foramen ovale)     echo 8/12: EF 55-65%, grade 2 diast dysfnx; +PFO on bubble study;  TEE 4/13: EF normal, atrial septum with suspicion for interatrial septum fenestrations without flow across and few large bubbles noted in LA.  This was not felt to require closure   . CAD (coronary artery disease)     a. s/p promus DES circumflex artery 10/13/10. b. Cath 04/2013: stable disease but possible small vessel disease -Imdur added.  Marland Kitchen DVT (deep venous thrombosis) 2013    "right"   . TIA (transient ischemic attack)     "1-2; both after the one in 02/2010" (09/25/2013)  . OSA on CPAP   . Type II diabetes mellitus     June 2014 AIC 6.2%/notes 09/24/2013; pt denies this hx on 09/25/2013  . Arthritis     "knees" (09/25/2013)  . Rheumatoid arthritis(714.0)   . CVA (cerebral vascular accident) 02/2010    "lost peripheral vision when I had the stroke; some memory issues since" (09/25/2013)   Past Surgical History  Procedure  Laterality Date  . Knee arthroscopy Bilateral 2000's    2 on left and 3 on rt  . Tee without cardioversion  03/21/2012    Procedure: TRANSESOPHAGEAL ECHOCARDIOGRAM (TEE);  Surgeon: Fay Records, MD;  Location: [facility] END[facility]COPY;  Service: Cardiovascular;  Laterality: N/A;  . Coronary angioplasty with stent placement  2012    "1" (09/24/2013)    Medications: Prior to Admission medications   Medication Sig Start Date End Date Taking? Authorizing Provider  acetaminophen (TYLENOL) 160 MG/5ML liquid Take 160 mg by mouth every 4 (four) hours as needed for fever.   Yes Historical Provider, MD  aspirin EC 81 MG EC tablet Take 1 tablet (81 mg total) by mouth daily. 04/25/13  Yes Dayna N Dunn, PA-C  atorvastatin (LIPITOR) 80 MG tablet Take 1 tablet (80 mg total) by mouth at bedtime. 01/18/14  Yes Burnell Blanks, MD  carvedilol (COREG) 25 MG tablet Take 1 tablet (25 mg total) by mouth 2 (two) times daily with a meal. 01/28/14  Yes Biagio Borg, MD  cloNIDine (CATAPRES) 0.2 MG tablet Take 1 tablet (0.2 mg total) by mouth 2 (two) times daily. 04/12/14  Yes Imogene Burn, PA-C  folic acid (FOLVITE) 1 MG tablet Take 1  mg by mouth daily.   Yes Historical Provider, MD  furosemide (LASIX) 20 MG tablet Take 20 mg by mouth daily.   Yes Historical Provider, MD  irbesartan (AVAPRO) 300 MG tablet Take 1 tablet (300 mg total) by mouth daily. 01/28/14  Yes Biagio Borg, MD  isosorbide mononitrate (IMDUR) 30 MG 24 hr tablet Take 30 mg by mouth at bedtime.   Yes Historical Provider, MD  mometasone (NASONEX) 50 MCG/ACT nasal spray Place 2 sprays into both nostrils daily as needed (allergies). To each nostril   Yes Historical Provider, MD  OxyCODONE (OXYCONTIN) 40 mg T12A 12 hr tablet Take 1 tablet (40 mg total) by mouth every 12 (twelve) hours. 03/29/14  Yes Danella Sensing, NP  pantoprazole (PROTONIX) 40 MG tablet Take 1 tablet (40 mg total) by mouth 2 (two) times daily before a meal. 08/14/13  Yes Burnell Blanks, MD   potassium chloride SA (KLOR-CON M20) 20 MEQ tablet Take 4 tablets (80 mEq total) by mouth 2 (two) times daily. 08/14/13  Yes Burnell Blanks, MD  warfarin (COUMADIN) 7.5 MG tablet Take 7.5 mg by mouth daily.   Yes Historical Provider, MD  nitroGLYCERIN (NITROSTAT) 0.4 MG SL tablet Place 1 tablet (0.4 mg total) under the tongue every 5 (five) minutes as needed for chest pain (up to 3 doses). 04/25/13 05/24/14  Dayna N Dunn, PA-C    Allergies:   Allergies  Allergen Reactions  . Iodine     REACTION: anaphylaxis  . Amlodipine     Foot edema    Social History:  reports that he quit smoking about 2 years ago. His smoking use included Cigarettes. He has a 35 pack-year smoking history. He has never used smokeless tobacco. He reports that he does not drink alcohol or use illicit drugs.  Family History: Family History  Problem Relation Age of Onset  . Cancer Neg Hx   . Kidney disease Neg Hx   . Diabetes Neg Hx     Physical Exam: Filed Vitals:   04/15/14 0030 04/15/14 0045 04/15/14 0100 04/15/14 0201  BP: 103/54 111/59 113/59 127/75  Pulse: 84 84 87 88  Temp:      TempSrc:      Resp: 18 21 15 16   Height:      Weight:      SpO2: 95% 95% 93% 99%   General appearance: alert, cooperative and no distress Head: Normocephalic, without obvious abnormality, atraumatic Eyes: negative Nose: Nares normal. Septum midline. Mucosa normal. No drainage or sinus tenderness. Neck: no JVD and supple, symmetrical, trachea midline Lungs: clear to auscultation bilaterally Heart: regular rate and rhythm, S1, S2 normal, no murmur, click, rub or gallop Abdomen: soft, non-tender; bowel sounds normal; no masses,  no organomegaly Extremities: extremities normal, atraumatic, no cyanosis or edema Pulses: 2+ and symmetric Skin: Skin color, texture, turgor normal. No rashes or lesions Neurologic: Grossly normal    Labs on Admission:   Recent Labs  04/15/14 0020  NA 142  K 3.6*  CL 104  CO2 27   GLUCOSE 154*  BUN 15  CREATININE 1.20  CALCIUM 8.8    Recent Labs  04/15/14 0020  AST 24  ALT 23  ALKPHOS 139*  BILITOT 0.4  PROT 7.3  ALBUMIN 3.7    Recent Labs  04/15/14 0020  WBC 11.2*  HGB 13.5  HCT 40.2  MCV 89.1  PLT 294    Recent Labs  04/15/14 0020  TROPONINI <0.30    Radiological Exams on  Admission: Ct Head Wo Contrast  04/15/2014   CLINICAL DATA:  Syncope  EXAM: CT HEAD WITHOUT CONTRAST  TECHNIQUE: Contiguous axial images were obtained from the base of the skull through the vertex without intravenous contrast.  COMPARISON:  Prior MRI from 03/17/2012  FINDINGS: Age-related atrophy with mild chronic microvascular ischemic disease noted. Small remote lacunar infarcts present within the basal ganglia bilaterally.  There is no acute intracranial hemorrhage or infarct. No mass lesion or midline shift. Gray-white matter differentiation is well maintained. Ventricles are normal in size without evidence of hydrocephalus. CSF containing spaces are within normal limits. No extra-axial fluid collection.  The calvarium is intact.  Orbital soft tissues are within normal limits.  Few scattered polypoid opacity is noted within the maxillary sinuses bilaterally. Paranasal sinuses are otherwise clear. No mastoid effusion.  Scalp soft tissues are unremarkable.  IMPRESSION: 1. No acute intracranial abnormality. 2. Age-related atrophy with moderate chronic microvascular ischemic disease and small remote lacunar infarcts within the bilateral basal ganglia.   Electronically Signed   By: Jeannine Boga M.D.   On: 04/15/2014 01:32   Dg Chest Portable 1 View  04/15/2014   CLINICAL DATA:  Syncopal episode tonight  EXAM: PORTABLE CHEST - 1 VIEW  COMPARISON:  DG CHEST 2 VIEW dated 02/22/2014  FINDINGS: The heart size and mediastinal contours are within normal limits. Both lungs are clear. The visualized skeletal structures are unremarkable.  IMPRESSION: No active disease.   Electronically  Signed   By: Kathreen Devoid   On: 04/15/2014 00:59    Assessment/Plan  53 yo male with syncopal episode likely due to orthostatic hypotension from recent increase in bp medication  Principal Problem:   Syncope due to orthostatic hypotension-  Hold all bp meds for now.  Give ivf overnight.  No focal deficits, but still having dizziness but much improved.  If this persists, would consider mri, as pt does also have significant history for tia/cva.  ekg nsr no acute issues.  Active Problems:   Essential hypertension, benign   Long term (current) use of anticoagulants   Medication adverse effect  obs on med.  Full code.  Rachal A Shanon Brow 04/15/2014, 2:07 AM

## 2014-04-15 NOTE — Evaluation (Signed)
Physical Therapy Evaluation and Discharge Patient Details Name: Tony Davis MRN: [id number] DOB: August 08, 1961 Today's Date: 04/15/2014   History of Present Illness  53 yo male h/o cva, htn uncontrolled, has recently had his clonodine doubled due to uncontrolled bp has been having dizziness upon moving. Pt with syncopal episode on 04/14/14 while sitting in passenger side seat of car and taken to hospital by EMT.  Clinical Impression  Patient evaluated by Physical Therapy with no further acute PT needs identified. Orthostatic vitals taken and recorded below in note. Dizziness upon standing resolving in less than one minute, and no symptoms of dizziness while ambulating. All education has been completed and the patient has no further questions. See below for any follow-up Physial Therapy or equipment needs. PT is signing off. Thank you for this referral.     Follow Up Recommendations No PT follow up    Equipment Recommendations  None recommended by PT    Recommendations for Other Services       Precautions / Restrictions Precautions Precautions: Fall Precaution Comments: syncope possibly from orthostatic hypotension Restrictions Weight Bearing Restrictions: No      Mobility  Bed Mobility Overal bed mobility: Modified Independent             General bed mobility comments: Mod I with bed mobility, no cues or assist needed  Transfers Overall transfer level: Needs assistance Equipment used: Rolling walker (2 wheeled) Transfers: Sit to/from Stand Sit to Stand: Supervision         General transfer comment: Verbal cues for sit<>stand from lowest bed setting for hand placement. Used RW for support  Ambulation/Gait Ambulation/Gait assistance: Supervision Ambulation Distance (Feet): 100 Feet Assistive device: Rolling walker (2 wheeled) Gait Pattern/deviations: Step-through pattern;Decreased stride length Gait velocity: slowed   General Gait Details: Pt ambulates well, uses  a cane and rollator at home due to knee OA. Decreased stride length. Cues for posture and forward gaze. No symptoms reported of dizziness during ambulation.  Stairs            Wheelchair Mobility    Modified Rankin (Stroke Patients Only)       Balance Overall balance assessment: Modified Independent                                           Pertinent Vitals/Pain Denies pain - some bilateral knee "soreness" of chronic nature  Vitals:  Supine - BP 161/81 (HR 79) Seated- BP 163/95 (HR 80) Standing - BP 158/94 (HR 84)  Voiced symptoms of dizziness with each position change that resolved within 1 minute No nystagmus identified.    Home Living Family/patient expects to be discharged to:: Private residence Living Arrangements: Spouse/significant other;Children Available Help at Discharge: Family Type of Home: House Home Access: Stairs to enter Entrance Stairs-Rails: Psychiatric nurse of Steps: 2 Home Layout: One level Home Equipment: Environmental consultant - 2 wheels;Cane - single point      Prior Function Level of Independence: Needs assistance   Gait / Transfers Assistance Needed: RW and cane with ambulation  ADL's / Homemaking Assistance Needed: Occasionally needs assist to dress due to knee OA  Comments: uses RW or cane for ambulation     Hand Dominance   Dominant Hand: Right    Extremity/Trunk Assessment   Upper Extremity Assessment: Overall WFL for tasks assessed  Lower Extremity Assessment: Generalized weakness (Reports bilateral OA - plans to have TKA in future)         Communication   Communication: No difficulties  Cognition Arousal/Alertness: Awake/alert Behavior During Therapy: WFL for tasks assessed/performed Overall Cognitive Status: Within Functional Limits for tasks assessed                      General Comments General comments (skin integrity, edema, etc.): Vitals taken in  supine/sitting/standing. Mild symptoms of dizziness when changing position (see vitals below). Pt reports symptoms resolved within 1 minute of changing position. Pt educated on safety and identifying signs and symptoms of syncope before ambulating.    Exercises        Assessment/Plan    PT Assessment Patent does not need any further PT services  PT Diagnosis     PT Problem List    PT Treatment Interventions     PT Goals (Current goals can be found in the Care Plan section) Acute Rehab PT Goals Patient Stated Goal: Go home PT Goal Formulation: No goals set, d/c therapy    Frequency     Barriers to discharge        Co-evaluation               End of Session   Activity Tolerance: Patient tolerated treatment well Patient left: in chair;with call bell/phone within reach Nurse Communication: Mobility status         Time: 6222-9798 PT Time Calculation (min): 33 min   Charges:   PT Evaluation $Initial PT Evaluation Tier I: 1 Procedure PT Treatments $Therapeutic Activity: 8-22 mins $Self Care/Home Management: 8-22   PT G Codes:         Elayne Snare, [place] 04/15/2014, 2:02 PM

## 2014-04-15 NOTE — ED Notes (Signed)
Pt asked if he could obtain a urine sample. Pt stated that he couldn't at this time. Will follow up shortly.

## 2014-04-15 NOTE — Progress Notes (Signed)
  Echocardiogram 2D Echocardiogram has been performed.  [place] 04/15/2014, 5:48 PM

## 2014-04-15 NOTE — ED Notes (Signed)
Pt wife at bedside. Pt wife states that patient was complaining of being weak and tired all day. Pt states that he started a medication for his BP that his doctor gave home 4 days ago. (pt's home BP prior to medication was regularly 160's/120's Pt states that today was the first day he has felt weak. Pt wife states they had the air on in the house and pt broke out in a sweat an passed out. Pt's wife called EMS and fire took his BP which was low according to wife. Pt states that his BP here 428-76 systolic is very low for him. Pt continues to feel weak and will fall asleep easily in bed.

## 2014-04-15 NOTE — ED Provider Notes (Signed)
CSN: [id number]     Arrival date & time 04/14/14  2316 History   None    Chief Complaint  Patient presents with  . Loss of Consciousness     (Consider location/radiation/quality/duration/timing/severity/associated sxs/prior Treatment) HPI Hx per PT  - saw CAR 2 days ago, had his clonidine doubled from 0.1mg  BID to 0.2mg  BID.  He has h/o CVA, DVT, CAD.  Started feeling weak and poor yesterday, worse today and stayed in bed.  He got up this evening and had syncopal event, was told by EMS his BP was low and remains low in the ER in the 90s on arrival.    Past Medical History  Diagnosis Date  . GERD (gastroesophageal reflux disease)   . HLD (hyperlipidemia)   . HTN (hypertension)   . Hematuria 8/12  . PFO (patent foramen ovale)     echo 8/12: EF 55-65%, grade 2 diast dysfnx; +PFO on bubble study;  TEE 4/13: EF normal, atrial septum with suspicion for interatrial septum fenestrations without flow across and few large bubbles noted in LA.  This was not felt to require closure   . CAD (coronary artery disease)     a. s/p promus DES circumflex artery 10/13/10. b. Cath 04/2013: stable disease but possible small vessel disease -Imdur added.  Marland Kitchen DVT (deep venous thrombosis) 2013    "right"   . TIA (transient ischemic attack)     "1-2; both after the one in 02/2010" (09/25/2013)  . OSA on CPAP   . Type II diabetes mellitus     June 2014 AIC 6.2%/notes 09/24/2013; pt denies this hx on 09/25/2013  . Arthritis     "knees" (09/25/2013)  . Rheumatoid arthritis(714.0)   . CVA (cerebral vascular accident) 02/2010    "lost peripheral vision when I had the stroke; some memory issues since" (09/25/2013)   Past Surgical History  Procedure Laterality Date  . Knee arthroscopy Bilateral 2000's    2 on left and 3 on rt  . Tee without cardioversion  03/21/2012    Procedure: TRANSESOPHAGEAL ECHOCARDIOGRAM (TEE);  Surgeon: Fay Records, MD;  Location: [facility] ENDOSCOPY;  Service: Cardiovascular;  Laterality: N/A;   . Coronary angioplasty with stent placement  2012    "1" (09/24/2013)   Family History  Problem Relation Age of Onset  . Cancer Neg Hx   . Kidney disease Neg Hx   . Diabetes Neg Hx    History  Substance Use Topics  . Smoking status: Former Smoker -- 1.00 packs/day for 35 years    Types: Cigarettes    Quit date: 03/10/2012  . Smokeless tobacco: Never Used  . Alcohol Use: No    Review of Systems  Constitutional: Negative for fever and chills.  Eyes: Negative for visual disturbance.  Respiratory: Negative for shortness of breath.   Cardiovascular: Negative for chest pain.  Gastrointestinal: Negative for abdominal pain.  Genitourinary: Negative for dysuria.  Musculoskeletal: Negative for back pain, neck pain and neck stiffness.  Skin: Negative for rash.  Neurological: Positive for syncope and weakness.  All other systems reviewed and are negative.     Allergies  Iodine and Amlodipine  Home Medications   Prior to Admission medications   Medication Sig Start Date End Date Taking? Authorizing Provider  acetaminophen (TYLENOL) 160 MG/5ML liquid Take 160 mg by mouth every 4 (four) hours as needed for fever.    Historical Provider, MD  aspirin EC 81 MG EC tablet Take 1 tablet (81 mg total) by mouth  daily. 04/25/13   Dayna N Dunn, PA-C  atorvastatin (LIPITOR) 80 MG tablet Take 1 tablet (80 mg total) by mouth at bedtime. 01/18/14   Burnell Blanks, MD  carvedilol (COREG) 25 MG tablet Take 1 tablet (25 mg total) by mouth 2 (two) times daily with a meal. 01/28/14   Biagio Borg, MD  cloNIDine (CATAPRES) 0.2 MG tablet Take 1 tablet (0.2 mg total) by mouth 2 (two) times daily. 04/12/14   Imogene Burn, PA-C  folic acid (FOLVITE) 1 MG tablet Take 1 mg by mouth daily.    Historical Provider, MD  furosemide (LASIX) 20 MG tablet Take 20 mg by mouth daily.    Historical Provider, MD  irbesartan (AVAPRO) 300 MG tablet Take 1 tablet (300 mg total) by mouth daily. 01/28/14   Biagio Borg,  MD  isosorbide mononitrate (IMDUR) 30 MG 24 hr tablet TAKE 1 TABLET BY MOUTH AT BEDTIME 10/23/13   Burnell Blanks, MD  mometasone (NASONEX) 50 MCG/ACT nasal spray Place 2 sprays into both nostrils daily as needed (allergies). To each nostril    Historical Provider, MD  nitroGLYCERIN (NITROSTAT) 0.4 MG SL tablet Place 1 tablet (0.4 mg total) under the tongue every 5 (five) minutes as needed for chest pain (up to 3 doses). 04/25/13 05/24/14  Dayna N Dunn, PA-C  OxyCODONE (OXYCONTIN) 40 mg T12A 12 hr tablet Take 1 tablet (40 mg total) by mouth every 12 (twelve) hours. 03/29/14   Danella Sensing, NP  pantoprazole (PROTONIX) 40 MG tablet Take 1 tablet (40 mg total) by mouth 2 (two) times daily before a meal. 08/14/13   Burnell Blanks, MD  potassium chloride SA (KLOR-CON M20) 20 MEQ tablet Take 4 tablets (80 mEq total) by mouth 2 (two) times daily. 08/14/13   Burnell Blanks, MD  warfarin (COUMADIN) 7.5 MG tablet Take 7.5 mg by mouth daily.    Historical Provider, MD   BP 115/69  Pulse 93  Temp(Src) 97.1 F (36.2 C) (Oral)  Resp 16  Ht 6' (1.829 m)  Wt 270 lb (122.471 kg)  BMI 36.61 kg/m2  SpO2 94% Physical Exam  Constitutional: He is oriented to person, place, and time. He appears well-developed and well-nourished.  HENT:  Head: Normocephalic and atraumatic.  Eyes: EOM are normal. Pupils are equal, round, and reactive to light.  Neck: Neck supple.  Cardiovascular: Normal rate, regular rhythm and intact distal pulses.   Pulmonary/Chest: Effort normal and breath sounds normal. No respiratory distress.  Abdominal: Soft. He exhibits no distension and no mass. There is no tenderness.  Musculoskeletal: Normal range of motion. He exhibits no edema.  Neurological: He is alert and oriented to person, place, and time. He displays normal reflexes. No cranial nerve deficit. Coordination normal.  Skin: Skin is warm and dry.    ED Course  Procedures (including critical care time) Labs  Review Labs Reviewed  CBC - Abnormal; Notable for the following:    WBC 11.2 (*)    All other components within normal limits  URINALYSIS, ROUTINE W REFLEX MICROSCOPIC - Abnormal; Notable for the following:    Color, Urine AMBER (*)    APPearance CLOUDY (*)    Hgb urine dipstick LARGE (*)    Bilirubin Urine SMALL (*)    Ketones, ur 15 (*)    Protein, ur 30 (*)    Leukocytes, UA TRACE (*)    All other components within normal limits  COMPREHENSIVE METABOLIC PANEL - Abnormal; Notable for the following:  Potassium 3.6 (*)    Glucose, Bld 154 (*)    Alkaline Phosphatase 139 (*)    GFR calc non Af Amer 69 (*)    GFR calc Af Amer 80 (*)    All other components within normal limits  PROTIME-INR - Abnormal; Notable for the following:    Prothrombin Time 26.7 (*)    INR 2.57 (*)    All other components within normal limits  URINE MICROSCOPIC-ADD ON - Abnormal; Notable for the following:    Casts HYALINE CASTS (*)    Crystals CA OXALATE CRYSTALS (*)    All other components within normal limits  GLUCOSE, CAPILLARY - Abnormal; Notable for the following:    Glucose-Capillary 162 (*)    All other components within normal limits  TROPONIN I  PROTIME-INR  I-STAT CG4 LACTIC ACID, ED    Imaging Review Ct Head Wo Contrast  04/15/2014   CLINICAL DATA:  Syncope  EXAM: CT HEAD WITHOUT CONTRAST  TECHNIQUE: Contiguous axial images were obtained from the base of the skull through the vertex without intravenous contrast.  COMPARISON:  Prior MRI from 03/17/2012  FINDINGS: Age-related atrophy with mild chronic microvascular ischemic disease noted. Small remote lacunar infarcts present within the basal ganglia bilaterally.  There is no acute intracranial hemorrhage or infarct. No mass lesion or midline shift. Gray-white matter differentiation is well maintained. Ventricles are normal in size without evidence of hydrocephalus. CSF containing spaces are within normal limits. No extra-axial fluid collection.   The calvarium is intact.  Orbital soft tissues are within normal limits.  Few scattered polypoid opacity is noted within the maxillary sinuses bilaterally. Paranasal sinuses are otherwise clear. No mastoid effusion.  Scalp soft tissues are unremarkable.  IMPRESSION: 1. No acute intracranial abnormality. 2. Age-related atrophy with moderate chronic microvascular ischemic disease and small remote lacunar infarcts within the bilateral basal ganglia.   Electronically Signed   By: Jeannine Boga M.D.   On: 04/15/2014 01:32   Dg Chest Portable 1 View  04/15/2014   CLINICAL DATA:  Syncopal episode tonight  EXAM: PORTABLE CHEST - 1 VIEW  COMPARISON:  DG CHEST 2 VIEW dated 02/22/2014  FINDINGS: The heart size and mediastinal contours are within normal limits. Both lungs are clear. The visualized skeletal structures are unremarkable.  IMPRESSION: No active disease.   Electronically Signed   By: Kathreen Devoid   On: 04/15/2014 00:59     Date: 04/15/2014  Rate: 85  Rhythm: normal sinus rhythm  QRS Axis: normal  Intervals: normal  ST/T Wave abnormalities: nonspecific ST/T changes  Conduction Disutrbances:none  Narrative Interpretation:   Old EKG Reviewed: unchanged  IVFs  MED c/s and admit  MDM  Dx: Syncope  ECG, labs, imaging IVFs MED admit  Teressa Lower, MD 04/15/14 2316

## 2014-04-15 NOTE — Progress Notes (Signed)
*  PRELIMINARY RESULTS* Vascular Ultrasound Carotid Duplex (Doppler) has been completed.  Preliminary findings: Bilateral:  1-39% ICA stenosis.  Vertebral artery flow is antegrade.      [place] 04/15/2014, 7:30 PM

## 2014-04-15 NOTE — Progress Notes (Signed)
I have seen and assessed the patient and agree with Dr. Shanon Brow assessment and plan. Orthostatics which were checked were negative. Patient with clinical improvement. Will check MRI of the head, carotid Dopplers, 2-D echo. Antihypertensive medications have been on hold. Patient on IV fluids. Will saline lock IV fluids. Resume Coreg and Avapro for now. Follow vitals.

## 2014-04-16 DIAGNOSIS — Z7901 Long term (current) use of anticoagulants: Secondary | ICD-10-CM | POA: Diagnosis not present

## 2014-04-16 DIAGNOSIS — I2510 Atherosclerotic heart disease of native coronary artery without angina pectoris: Secondary | ICD-10-CM

## 2014-04-16 DIAGNOSIS — I951 Orthostatic hypotension: Secondary | ICD-10-CM | POA: Diagnosis not present

## 2014-04-16 DIAGNOSIS — I10 Essential (primary) hypertension: Secondary | ICD-10-CM | POA: Diagnosis not present

## 2014-04-16 LAB — CBC
HCT: 41 % (ref 39.0–52.0)
Hemoglobin: 13.6 g/dL (ref 13.0–17.0)
MCH: 29.6 pg (ref 26.0–34.0)
MCHC: 33.2 g/dL (ref 30.0–36.0)
MCV: 89.3 fL (ref 78.0–100.0)
PLATELETS: 302 10*3/uL (ref 150–400)
RBC: 4.59 MIL/uL (ref 4.22–5.81)
RDW: 12.7 % (ref 11.5–15.5)
WBC: 8.7 10*3/uL (ref 4.0–10.5)

## 2014-04-16 LAB — BASIC METABOLIC PANEL
BUN: 11 mg/dL (ref 6–23)
CO2: 25 mEq/L (ref 19–32)
CREATININE: 0.81 mg/dL (ref 0.50–1.35)
Calcium: 8.8 mg/dL (ref 8.4–10.5)
Chloride: 103 mEq/L (ref 96–112)
GFR calc non Af Amer: >90 mL/min (ref >90)
Glucose, Bld: 202 mg/dL — ABNORMAL HIGH (ref 70–99)
Potassium: 3.7 mEq/L (ref 3.7–5.3)
Sodium: 142 mEq/L (ref 137–147)

## 2014-04-16 LAB — PROTIME-INR
INR: 3.17 — ABNORMAL HIGH (ref 0.00–1.49)
Prothrombin Time: 31.4 seconds — ABNORMAL HIGH (ref 11.6–15.2)

## 2014-04-16 MED ORDER — WARFARIN SODIUM 7.5 MG PO TABS: 7.5000 mg | ORAL_TABLET | Freq: Every day | ORAL | Status: DC

## 2014-04-16 MED ORDER — FUROSEMIDE 20 MG PO TABS
20.0000 mg | ORAL_TABLET | Freq: Every day | ORAL | Status: DC
  Administered 2014-04-16: 20 mg via ORAL
  Filled 2014-04-16 (×2): qty 1

## 2014-04-16 MED ORDER — POTASSIUM CHLORIDE CRYS ER 20 MEQ PO TBCR
80.0000 meq | EXTENDED_RELEASE_TABLET | Freq: Two times a day (BID) | ORAL | Status: DC
  Administered 2014-04-16: 80 meq via ORAL
  Filled 2014-04-16: qty 4

## 2014-04-16 NOTE — Discharge Summary (Signed)
Physician Discharge Summary  Tony Davis OAC:[id number] DOB: Jul 09, 1961 DOA: 04/14/2014  PCP: [name], MD  Admit date: 04/14/2014 Discharge date: 04/16/2014  Time spent: 65 minutes  Recommendations for Outpatient Follow-up:  1. Followup with [name], MD one week post discharge. On followup patient needs a basic metabolic profile to followup on his electrolytes and renal function. 2. Patient is to followup with his cardiologist, Dr. Clifton James as scheduled for BP check and a Coumadin check next week. 04/22/2014.  Discharge Diagnoses:  Principal Problem:   Syncope due to orthostatic hypotension Active Problems:   Hyperlipidemia LDL goal < 100   Essential hypertension, benign   GERD   DVT (deep venous thrombosis)   Long term (current) use of anticoagulants   Medication adverse effect   Syncope   Discharge Condition: Stable and improved  Diet recommendation: Heart healthy  Filed Weights   04/14/14 2324  Weight: 122.471 kg (270 lb)    History of present illness:  53 yo male h/o cva, htn uncontrolled, has recently 2 days ago had his clonodine doubled due to uncontrolled bp has been having dizziness upon moving all day today when it got so bad when trying to get into his house from the car with his wife that he passed out. EMS was called and his bp was low on their arrival. In ED his sbp initially was less than 100. Pt says his bp is never this low. No focal neuro deficits. No recent fevers. No n/v/d. He is still feeling very weak. No cp. No sob. No le edema or swelling. He has received at least one liter of ivf, unknown amt of fluids given by EMS. Pt feels better since arrival to ED.   Hospital Course:  #1 syncope secondary to orthostatic hypotension Patient had presented with a syncopal episode which was preceded by dizziness. 1-2 days prior to admission patient had had his clonidine-secondary to uncontrolled blood pressure. Per patient and per admission note when EMS  arrived patient's systolic blood pressure was in the 90s. It was felt patient's syncopal episode was likely secondary to orthostatic hypotension would recently increased clonidine dose. Patient was placed on IV fluids his antihypertensive medications were initially held and patient was followed. Patient improved clinically. 2-D echo which was obtained at 8 EF of 55-60% with no wall motion abnormalities. Carotid Dopplers also obtained had no significant ICA stenosis. Patient was resumed back on his Coreg, Lasix, Avapro. Patient's clonidine was held. Patient's Imdur will be resumed on discharge. Patient be discharged home in stable and improved condition off his clonidine and patient has been instructed to check his blood pressure daily and record it and digital followup visit. Patient did not have any noted arrhythmias. Patient will be discharged in stable and improved condition.  #2 history of DVT Patient was maintained on Coumadin throughout the hospitalization. On day of discharge patient's INR was 3.17. Patient has been instructed to hold his Coumadin dose tonight and resume tomorrow. Patient will followup in Coumadin clinic as scheduled next week.  #3 hypertension Patient was noted to have a history of uncontrolled hypertension. Patient had presented with a syncopal episode after a recent increase in his clonidine. Patient stated that he takes his blood pressure medications and afternoons. Patient was hydrated with IV fluids patient's orthostatic hypotension had resolved and patient was resumed back on his home regimen of Coreg, Avapro, Lasix. Patient's Imdur to will be resumed on discharge. Patient's clonidine will not be resumed. Patient is to followup as  previously scheduled next week.  The rest of patient's chronic medical issues were stable throughout the hospitalization the patient was discharged in stable and improved condition.  Procedures:  Carotid Dopplers 04/15/2014  2-D echo  04/15/2014  Consultations:  None  Discharge Exam: Filed Vitals:   04/16/14 0503  BP: 155/90  Pulse: 87  Temp: 97.6 F (36.4 C)  Resp: 20    General: NAD Cardiovascular: RRR Respiratory: CTAB  Discharge Instructions You were cared for by a hospitalist during your hospital stay. If you have any questions about your discharge medications or the care you received while you were in the hospital after you are discharged, you can call the unit and asked to speak with the hospitalist on call if the hospitalist that took care of you is not available. Once you are discharged, your primary care physician will handle any further medical issues. Please note that NO REFILLS for any discharge medications will be authorized once you are discharged, as it is imperative that you return to your primary care physician (or establish a relationship with a primary care physician if you do not have one) for your aftercare needs so that they can reassess your need for medications and monitor your lab values.      Discharge Orders   Future Appointments Provider Department Dept Phone   04/22/2014 8:30 AM [facility] [place] [facility] [phone]   04/22/2014 9:00 AM [facility] Nurse [place] [facility] [phone]   Patient should bring all current BP medications.   04/28/2014 8:00 AM Danella Sensing, NP [facility] [phone]   05/13/2014 11:30 AM Burnell Blanks, MD [place] Office [phone]   07/08/2014 9:15 AM Deneise Lever, MD [facility] [phone]   Future Orders Complete By Expires   Diet - low sodium heart healthy  As directed    Discharge instructions  As directed    Increase activity slowly  As directed        Medication List    STOP taking these medications       cloNIDine 0.2 MG tablet  Commonly known as:  CATAPRES      TAKE these medications       acetaminophen 160  MG/5ML liquid  Commonly known as:  TYLENOL  Take 160 mg by mouth every 4 (four) hours as needed for fever.     aspirin 81 MG EC tablet  Take 1 tablet (81 mg total) by mouth daily.     atorvastatin 80 MG tablet  Commonly known as:  LIPITOR  Take 1 tablet (80 mg total) by mouth at bedtime.     carvedilol 25 MG tablet  Commonly known as:  COREG  Take 1 tablet (25 mg total) by mouth 2 (two) times daily with a meal.     folic acid 1 MG tablet  Commonly known as:  FOLVITE  Take 1 mg by mouth daily.     furosemide 20 MG tablet  Commonly known as:  LASIX  Take 20 mg by mouth daily.     irbesartan 300 MG tablet  Commonly known as:  AVAPRO  Take 1 tablet (300 mg total) by mouth daily.     isosorbide mononitrate 30 MG 24 hr tablet  Commonly known as:  IMDUR  Take 30 mg by mouth at bedtime.     mometasone 50 MCG/ACT nasal spray  Commonly known as:  NASONEX  Place 2 sprays into both nostrils  daily as needed (allergies). To each nostril     nitroGLYCERIN 0.4 MG SL tablet  Commonly known as:  NITROSTAT  Place 1 tablet (0.4 mg total) under the tongue every 5 (five) minutes as needed for chest pain (up to 3 doses).     OxyCODONE 40 mg T12a 12 hr tablet  Commonly known as:  OXYCONTIN  Take 1 tablet (40 mg total) by mouth every 12 (twelve) hours.     pantoprazole 40 MG tablet  Commonly known as:  PROTONIX  Take 1 tablet (40 mg total) by mouth 2 (two) times daily before a meal.     potassium chloride SA 20 MEQ tablet  Commonly known as:  KLOR-CON M20  Take 4 tablets (80 mEq total) by mouth 2 (two) times daily.     warfarin 7.5 MG tablet  Commonly known as:  COUMADIN  Take 1 tablet (7.5 mg total) by mouth daily.  Start taking on:  04/17/2014       Allergies  Allergen Reactions  . Iodine     REACTION: anaphylaxis  . Amlodipine     Foot edema   Follow-up Information   Follow up with [name], MD. Schedule an appointment as soon as possible for a visit in 1 week.    Specialty:  Internal Medicine   Contact information:   [address] [place] [address] [phone]       Follow up with Lauree Chandler, MD On 04/22/2014. (f/u as scheduled.)    Specialty:  Cardiology   Contact information:   [place] [place] [address] [phone]        The results of significant diagnostics from this hospitalization (including imaging, microbiology, ancillary and laboratory) are listed below for reference.    Significant Diagnostic Studies: Ct Head Wo Contrast  04/15/2014   CLINICAL DATA:  Syncope  EXAM: CT HEAD WITHOUT CONTRAST  TECHNIQUE: Contiguous axial images were obtained from the base of the skull through the vertex without intravenous contrast.  COMPARISON:  Prior MRI from 03/17/2012  FINDINGS: Age-related atrophy with mild chronic microvascular ischemic disease noted. Small remote lacunar infarcts present within the basal ganglia bilaterally.  There is no acute intracranial hemorrhage or infarct. No mass lesion or midline shift. Gray-white matter differentiation is well maintained. Ventricles are normal in size without evidence of hydrocephalus. CSF containing spaces are within normal limits. No extra-axial fluid collection.  The calvarium is intact.  Orbital soft tissues are within normal limits.  Few scattered polypoid opacity is noted within the maxillary sinuses bilaterally. Paranasal sinuses are otherwise clear. No mastoid effusion.  Scalp soft tissues are unremarkable.  IMPRESSION: 1. No acute intracranial abnormality. 2. Age-related atrophy with moderate chronic microvascular ischemic disease and small remote lacunar infarcts within the bilateral basal ganglia.   Electronically Signed   By: Jeannine Boga M.D.   On: 04/15/2014 01:32   Dg Chest Portable 1 View  04/15/2014   CLINICAL DATA:  Syncopal episode tonight  EXAM: PORTABLE CHEST - 1 VIEW  COMPARISON:  DG CHEST 2 VIEW dated 02/22/2014  FINDINGS: The  heart size and mediastinal contours are within normal limits. Both lungs are clear. The visualized skeletal structures are unremarkable.  IMPRESSION: No active disease.   Electronically Signed   By: Kathreen Devoid   On: 04/15/2014 00:59    Microbiology: No results found for this or any previous visit (from the past 240 hour(s)).   Labs: Basic Metabolic Panel:  Recent Labs Lab 04/15/14 0020 04/16/14 0904  NA 142 142  K 3.6* 3.7  CL 104 103  CO2 27 25  GLUCOSE 154* 202*  BUN 15 11  CREATININE 1.20 0.81  CALCIUM 8.8 8.8   Liver Function Tests:  Recent Labs Lab 04/15/14 0020  AST 24  ALT 23  ALKPHOS 139*  BILITOT 0.4  PROT 7.3  ALBUMIN 3.7   No results found for this basename: LIPASE, AMYLASE,  in the last 168 hours No results found for this basename: AMMONIA,  in the last 168 hours CBC:  Recent Labs Lab 04/15/14 0020 04/16/14 0904  WBC 11.2* 8.7  HGB 13.5 13.6  HCT 40.2 41.0  MCV 89.1 89.3  PLT 294 302   Cardiac Enzymes:  Recent Labs Lab 04/15/14 0020  TROPONINI <0.30   BNP: BNP (last 3 results)  Recent Labs  04/22/13 1005 07/27/13 1032  PROBNP 27.0 16.0   CBG:  Recent Labs Lab 04/15/14 0629  GLUCAP 162*       Signed:  Eugenie Filler MD  [facility] 04/16/2014, 1:51 PM

## 2014-04-16 NOTE — Progress Notes (Signed)
Late entry for missed G-code.   04-28-14 1101  PT G-Codes **NOT FOR INPATIENT CLASS**  Functional Assessment Tool Used Clinical judgement/assessment  Functional Limitation Mobility: Walking and moving around  Mobility: Walking and Moving Around Current Status [phone]) CI  Mobility: Walking and Moving Around Goal Status [phone]) CI  Mobility: Walking and Moving Around Discharge Status [phone]) CI    Camille Bal [place], [place]

## 2014-04-19 ENCOUNTER — Other Ambulatory Visit: Payer: Self-pay | Admitting: Cardiovascular Disease

## 2014-04-28 ENCOUNTER — Encounter: Payer: Medicare Other | Attending: Physical Medicine & Rehabilitation | Admitting: Registered Nurse

## 2014-04-28 ENCOUNTER — Telehealth: Payer: Self-pay

## 2014-04-28 ENCOUNTER — Encounter: Payer: Self-pay | Admitting: Registered Nurse

## 2014-04-28 VITALS — BP 156/96 | HR 88 | Resp 14 | Ht 72.0 in | Wt 277.0 lb

## 2014-04-28 DIAGNOSIS — Z79899 Other long term (current) drug therapy: Secondary | ICD-10-CM

## 2014-04-28 DIAGNOSIS — Z5181 Encounter for therapeutic drug level monitoring: Secondary | ICD-10-CM | POA: Diagnosis not present

## 2014-04-28 DIAGNOSIS — M1712 Unilateral primary osteoarthritis, left knee: Secondary | ICD-10-CM

## 2014-04-28 DIAGNOSIS — M1711 Unilateral primary osteoarthritis, right knee: Secondary | ICD-10-CM

## 2014-04-28 DIAGNOSIS — I2510 Atherosclerotic heart disease of native coronary artery without angina pectoris: Secondary | ICD-10-CM | POA: Diagnosis not present

## 2014-04-28 DIAGNOSIS — IMO0002 Reserved for concepts with insufficient information to code with codable children: Secondary | ICD-10-CM | POA: Diagnosis not present

## 2014-04-28 DIAGNOSIS — M1710 Unilateral primary osteoarthritis, unspecified knee: Secondary | ICD-10-CM | POA: Insufficient documentation

## 2014-04-28 MED ORDER — OXYCODONE HCL 15 MG PO TABS: 15.0000 mg | ORAL_TABLET | ORAL | Status: DC | PRN

## 2014-04-28 MED ORDER — OXYCODONE HCL ER 40 MG PO T12A: 40.0000 mg | EXTENDED_RELEASE_TABLET | Freq: Two times a day (BID) | ORAL | Status: DC

## 2014-04-28 NOTE — Telephone Encounter (Signed)
Patient called about his oxy 40 costing to much.  He would like a call back.

## 2014-04-28 NOTE — Progress Notes (Signed)
Subjective:    Patient ID: Tony Davis, male    DOB: 07-07-1961, 53 y.o.   MRN: [id number]  HPI: Tony Davis is a 53 year old male who returns for follow up for chronic pain and medication refill. He says his pain is in his knees left greater than right. He rates his pain 7. His current exercise regime is walking and going to [employer] twice a week he uses the treadmill and pool. He was asking for increase in pain medications, educated patient on alternative methods such as heat and ice and exercise in conjunction with pain medications. He verbalizes understanding and pain medications remained the same.  He was admitted at [facility] on 04/14/2014 for Syncope due to orthostatic hypotension.His clonidine was doubled due to uncontrolled bp, he has been having dizziness upon moving all day today (04/14/14) when it got so bad when trying to get into his house from the car with his wife that he passed out. EMS was called and his bp was low on their arrival. He was admitted 04/14/14 and discharged on 04/16/14. His Clonidine has been discontinued.  Pain Inventory Average Pain 8 Pain Right Now 7 My pain is sharp and aching  In the last 24 hours, has pain interfered with the following? General activity 8 Relation with others 8 Enjoyment of life 8 What TIME of day is your pain at its worst? daytime Sleep (in general) Poor  Pain is worse with: walking, bending and sitting Pain improves with: rest, heat/ice and medication Relief from Meds: 7  Mobility use a cane how many minutes can you walk? 5-10  Function not employed: date last employed .  Neuro/Psych trouble walking  Prior Studies x-rays CT/MRI  Physicians involved in your care Any changes since last visit?  no   Family History  Problem Relation Age of Onset  . Cancer Neg Hx   . Kidney disease Neg Hx   . Diabetes Neg Hx    History   Social History  . Marital Status: Married    Spouse Name: N/A    Number of  Children: N/A  . Years of Education: N/A   Social History Main Topics  . Smoking status: Former Smoker -- 1.00 packs/day for 35 years    Types: Cigarettes    Quit date: 03/10/2012  . Smokeless tobacco: Never Used  . Alcohol Use: No  . Drug Use: No  . Sexual Activity: Yes   Other Topics Concern  . None   Social History Narrative   Married, 5 kids; lives in [place]. 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    Past Surgical History  Procedure Laterality Date  . Knee arthroscopy Bilateral 2000's    2 on left and 3 on rt  . Tee without cardioversion  03/21/2012    Procedure: TRANSESOPHAGEAL ECHOCARDIOGRAM (TEE);  Surgeon: Fay Records, MD;  Location: [facility] ENDOSCOPY;  Service: Cardiovascular;  Laterality: N/A;  . Coronary angioplasty with stent placement  2012    "1" (09/24/2013)   Past Medical History  Diagnosis Date  . GERD (gastroesophageal reflux disease)   . HLD (hyperlipidemia)   . HTN (hypertension)   . Hematuria 8/12  . PFO (patent foramen ovale)     echo 8/12: EF 55-65%, grade 2 diast dysfnx; +PFO on bubble study;  TEE 4/13: EF normal, atrial septum with suspicion for interatrial septum fenestrations without flow across and few large bubbles noted in LA.  This was not felt to require closure   . CAD (coronary artery disease)     a. s/p promus DES circumflex artery 10/13/10. b. Cath 04/2013: stable disease but possible small vessel disease -Imdur added.  Marland Kitchen DVT (deep venous thrombosis) 2013    "right"   . TIA (transient ischemic attack)     "1-2; both after the one in 02/2010" (09/25/2013)  . OSA on CPAP   . Type II diabetes mellitus     June 2014 AIC 6.2%/notes 09/24/2013; pt denies this hx on 09/25/2013  . Arthritis     "knees" (09/25/2013)  . Rheumatoid arthritis(714.0)   . CVA (cerebral vascular accident) 02/2010    "lost peripheral vision when I had the stroke; some memory issues since" (09/25/2013)   BP 156/96  Pulse 88  Resp 14  Ht 6' (1.829 m)   Wt 277 lb (125.646 kg)  BMI 37.56 kg/m2  SpO2 97%  Opioid Risk Score:   Fall Risk Score: Moderate Fall Risk (6-13 points) (patient educated handout declined)   Review of Systems  Musculoskeletal: Positive for gait problem.  All other systems reviewed and are negative.      Objective:   Physical Exam  Nursing note and vitals reviewed. Constitutional: He is oriented to person, place, and time. He appears well-developed and well-nourished.  HENT:  Head: Normocephalic and atraumatic.  Neck: Normal range of motion. Neck supple.  Cardiovascular: Normal rate and regular rhythm.   Pulmonary/Chest: Effort normal and breath sounds normal.  Musculoskeletal:  Normal Muscle Bulk: Muscle Testing Reveals: Upper Extremities: Right Arm Full ROM 180 degree and Muscle Strength 5/5.  Left arm Decreased ROM 45 degress and Muscle Strength 4/5. Back without spinal or paraspinal tenderness Lower Extremities: Bilateral Patella's tenderness with palpation, no swelling noted. Flexion Produces pain at medial and lateral joint line and patellar tendon. Arises from chair with ease Walks with a straight cane Circumduction Gait.  Neurological: He is alert and oriented to person, place, and time.  Skin: Skin is warm and dry.  Psychiatric: He has a normal mood and affect.          Assessment & Plan:  1. Bilateral knee pain d/t endstage OA. Awaiting TKR. Dr. Earleen Newport. He says he's not ready to have the surgery at this moment. Refilled: OxyCODONE 40mg  one tablet every 12 hrs #60 and oxyCodone 15 mg every 8 hours as needed #90. 2. HTN: Better Control Today: PMD Following   20 minutes of face to face patient care time was spent during this visit all questions were encouraged and answered.  F/U in 1 month

## 2014-04-29 ENCOUNTER — Telehealth: Payer: Self-pay | Admitting: Cardiovascular Disease

## 2014-04-29 DIAGNOSIS — R6889 Other general symptoms and signs: Secondary | ICD-10-CM | POA: Diagnosis not present

## 2014-04-29 NOTE — Telephone Encounter (Signed)
Contacted patient he said that his OxyContin cost him $230this month. Informed patient that we tried for a tier exception last month and it did not work. Patient states "okay its fine I bought it anyhow"

## 2014-04-29 NOTE — Telephone Encounter (Signed)
Reviewed with Dr. Martinique and pt can be scheduled for office visit next week and should go to ED if problems prior to appt. I spoke with pt and gave him this information. I offered pt appointment with Richardson Dopp, PA on May 04, 2014 but pt cannot be here on Tuesday. He would like to keep scheduled appt with Dr. Angelena Form on May 13, 2014. He is aware to go to ED if problems.

## 2014-04-29 NOTE — Telephone Encounter (Signed)
New problem   Pt is not feel well because of low blood pressure per paramedic. Pt want to come in to be seen tomorrow. Please call pt.

## 2014-04-29 NOTE — Telephone Encounter (Signed)
Spoke with pt and his wife. 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  He reports he then passed out. EMS was called and pt reports he was told his blood pressure was low. Pt's wife was given paperwork from EMS with blood pressure readings of 118/78 and heart rate 92. Next reading was 102/60 and last reading was 126/65.  EMS wanted to transport pt to hospital but he refused. Pt reports he was told to contact our office to be seen today or tomorrow.  Pt's wife is requesting office visit tomorrow. Pt is feeling fine at this time.

## 2014-05-13 ENCOUNTER — Ambulatory Visit (INDEPENDENT_AMBULATORY_CARE_PROVIDER_SITE_OTHER): Payer: Medicare Other | Admitting: Cardiovascular Disease

## 2014-05-13 ENCOUNTER — Encounter: Payer: Self-pay | Admitting: Cardiovascular Disease

## 2014-05-13 ENCOUNTER — Ambulatory Visit (INDEPENDENT_AMBULATORY_CARE_PROVIDER_SITE_OTHER): Payer: Medicare Other | Admitting: Pharmacist Clinician (PhC)/ Clinical Pharmacy Specialist

## 2014-05-13 VITALS — BP 140/92 | HR 90 | Ht 72.0 in | Wt 278.0 lb

## 2014-05-13 DIAGNOSIS — I6350 Cerebral infarction due to unspecified occlusion or stenosis of unspecified cerebral artery: Secondary | ICD-10-CM

## 2014-05-13 DIAGNOSIS — F17201 Nicotine dependence, unspecified, in remission: Secondary | ICD-10-CM

## 2014-05-13 DIAGNOSIS — G4730 Sleep apnea, unspecified: Secondary | ICD-10-CM

## 2014-05-13 DIAGNOSIS — Q211 Atrial septal defect: Secondary | ICD-10-CM

## 2014-05-13 DIAGNOSIS — Z7901 Long term (current) use of anticoagulants: Secondary | ICD-10-CM

## 2014-05-13 DIAGNOSIS — I10 Essential (primary) hypertension: Secondary | ICD-10-CM

## 2014-05-13 DIAGNOSIS — Q2111 Secundum atrial septal defect: Secondary | ICD-10-CM | POA: Diagnosis not present

## 2014-05-13 DIAGNOSIS — I82409 Acute embolism and thrombosis of unspecified deep veins of unspecified lower extremity: Secondary | ICD-10-CM | POA: Diagnosis not present

## 2014-05-13 DIAGNOSIS — I2510 Atherosclerotic heart disease of native coronary artery without angina pectoris: Secondary | ICD-10-CM | POA: Diagnosis not present

## 2014-05-13 DIAGNOSIS — Q2112 Patent foramen ovale: Secondary | ICD-10-CM

## 2014-05-13 DIAGNOSIS — Z87891 Personal history of nicotine dependence: Secondary | ICD-10-CM

## 2014-05-13 LAB — POCT INR: INR: 4.3

## 2014-05-13 MED ORDER — LOSARTAN POTASSIUM 100 MG PO TABS: 100.0000 mg | ORAL_TABLET | Freq: Every day | ORAL | Status: DC

## 2014-05-13 MED ORDER — ISOSORBIDE MONONITRATE ER 30 MG PO TB24: 30.0000 mg | ORAL_TABLET | Freq: Two times a day (BID) | ORAL | Status: DC

## 2014-05-13 NOTE — Progress Notes (Signed)
History of Present Illness: 53 yo AAM with history of CAD, HTN, hyperlipidemia, impaired fasting glucose and obesity who is here today for cardiac follow up. I saw him in February 2011 for complaints of chest pain and dizziness. I arranged a stress test, an echo and carotid dopplers at the first visit. The carotid dopplers were normal. He came in for his stress test and complained of neurological changes. He was seen by Dr. Lia Foyer and the stress test was cancelled. He was sent over to Dr. Stones office and had MRI/MRA of the head and neck. MRA showed no occlusive disease. MRI showed non-specific white matter changes but no clear infarct, possibly related to hypertension. His echo showed septal and inferobasal hypokinesis with mild concentric hypertrophy, EF 45-50%. Left heart cath October 2011 with severe stenosis in the mid Circumflex. PCI Circumflex with placement of Promus DES 10/13/10. Repeat echo August 2012 with PFO with positive bubble study. I arranged for him to see Dr. Burt Knack for PFO closure but he skipped his appointment. He was in [place] April 2013 and had acute onset of left arm and leg weakness. He was admitted there. CT head without acute stroke. Venous dopplers with chronic DVT right femoral vein. Carotids with <78% RICA stenosis, LICA ok. Echo with PFO. Echo 03/14/12 in [place] with normal LV size and function with PFO with positive bubble study. He was started on coumadin and Lovenox and left AMA from the hospital there. I saw him in our office 03/19/12. At that time, he continued to have left sided weakness. He had not been compliant with plans for Lovenox and coumadin. He was admitted for Heparin and coumadin. TEE was done 03/21/12: EF normal, atrial septum with suspicion for interatrial septum fenestrations without flow across and few large bubbles noted in LA. This was not felt to require closure. He has been maintained on coumadin and has been followed in our coumadin clinic.  Venous  dopplers 2013 with no DVT.  He has been been diagnosed with OSA and has been started on CPAP. He was admitted to [facility] 04/22/13 with chest pain. Cardiac markers were negative. Cardiac cath 04/24/13 with stable CAD. Patent stent distal Circumflex, mild disease LAD, moderate disease RCA. He was admitted to [facility] 04/14/14 after syncopal event felt to be due to increased Clonidine dose/orthostatic hypotension. Clonidine was stopped. Norvasc stopped in past due to edema. Another syncopal event two weeks ago in [employer]. BP low via EMS-105/70.   He is here today for follow up. He tells me that he has been feeling well last two weeks but tired and fatigued. Not using CPAP due to cost. No chest pain or SOB. He has stopped smoking in April 2013. Tolerating coumadin with no bleeding. Has missed coumadin clinic appts.   Primary Care Physician: [name]  Past Medical History  Diagnosis Date  . GERD (gastroesophageal reflux disease)   . HLD (hyperlipidemia)   . HTN (hypertension)   . Hematuria 8/12  . PFO (patent foramen ovale)     echo 8/12: EF 55-65%, grade 2 diast dysfnx; +PFO on bubble study;  TEE 4/13: EF normal, atrial septum with suspicion for interatrial septum fenestrations without flow across and few large bubbles noted in LA.  This was not felt to require closure   . CAD (coronary artery disease)     a. s/p promus DES circumflex artery 10/13/10. b. Cath 04/2013: stable disease but possible small vessel disease -Imdur added.  Marland Kitchen DVT (deep venous thrombosis)  2013    "right"   . TIA (transient ischemic attack)     "1-2; both after the one in 02/2010" (09/25/2013)  . OSA on CPAP   . Type II diabetes mellitus     June 2014 AIC 6.2%/notes 09/24/2013; pt denies this hx on 09/25/2013  . Arthritis     "knees" (09/25/2013)  . Rheumatoid arthritis(714.0)   . CVA (cerebral vascular accident) 02/2010    "lost peripheral vision when I had the stroke; some memory issues since" (09/25/2013)    Past  Surgical History  Procedure Laterality Date  . Knee arthroscopy Bilateral 2000's    2 on left and 3 on rt  . Tee without cardioversion  03/21/2012    Procedure: TRANSESOPHAGEAL ECHOCARDIOGRAM (TEE);  Surgeon: Fay Records, MD;  Location: [facility] ENDOSCOPY;  Service: Cardiovascular;  Laterality: N/A;  . Coronary angioplasty with stent placement  2012    "1" (09/24/2013)    Current Outpatient Prescriptions  Medication Sig Dispense Refill  . acetaminophen (TYLENOL) 160 MG/5ML liquid Take 160 mg by mouth every 4 (four) hours as needed for fever.      Marland Kitchen aspirin EC 81 MG EC tablet Take 1 tablet (81 mg total) by mouth daily.      Marland Kitchen atorvastatin (LIPITOR) 80 MG tablet Take 1 tablet (80 mg total) by mouth at bedtime.  90 tablet  1  . carvedilol (COREG) 25 MG tablet Take 1 tablet (25 mg total) by mouth 2 (two) times daily with a meal.  086 tablet  3  . folic acid (FOLVITE) 1 MG tablet Take 1 mg by mouth daily.      . furosemide (LASIX) 20 MG tablet Take 20 mg by mouth daily.      . irbesartan (AVAPRO) 300 MG tablet Take 1 tablet (300 mg total) by mouth daily.  90 tablet  3  . isosorbide mononitrate (IMDUR) 30 MG 24 hr tablet Take 30 mg by mouth at bedtime.      . mometasone (NASONEX) 50 MCG/ACT nasal spray Place 2 sprays into both nostrils daily as needed (allergies). To each nostril      . nitroGLYCERIN (NITROSTAT) 0.4 MG SL tablet Place 1 tablet (0.4 mg total) under the tongue every 5 (five) minutes as needed for chest pain (up to 3 doses).  25 tablet  3  . OxyCODONE (OXYCONTIN) 40 mg T12A 12 hr tablet Take 1 tablet (40 mg total) by mouth every 12 (twelve) hours.  60 tablet  0  . oxyCODONE (ROXICODONE) 15 MG immediate release tablet Take 1 tablet (15 mg total) by mouth every 4 (four) hours as needed for pain.  90 tablet  0  . pantoprazole (PROTONIX) 40 MG tablet Take 1 tablet (40 mg total) by mouth 2 (two) times daily before a meal.  60 tablet  5  . potassium chloride SA (KLOR-CON M20) 20 MEQ tablet Take  4 tablets (80 mEq total) by mouth 2 (two) times daily.  224 tablet  11  . warfarin (COUMADIN) 7.5 MG tablet Take 1 tablet (7.5 mg total) by mouth daily.       No current facility-administered medications for this visit.    Allergies  Allergen Reactions  . Iodine     REACTION: anaphylaxis  . Amlodipine     Foot edema    History   Social History  . Marital Status: Married    Spouse Name: N/A    Number of Children: N/A  . Years of Education: N/A  Occupational History  . Not on file.   Social History Main Topics  . Smoking status: Former Smoker -- 1.00 packs/day for 35 years    Types: Cigarettes    Quit date: 03/10/2012  . Smokeless tobacco: Never Used  . Alcohol Use: No  . Drug Use: No  . Sexual Activity: Yes   Other Topics Concern  . Not on file   Social History Narrative   Married, 5 kids; lives in [place]. 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     Family History  Problem Relation Age of Onset  . Cancer Neg Hx   . Kidney disease Neg Hx   . Diabetes Neg Hx     Review of Systems:  As stated in the HPI and otherwise negative.   BP 140/92  Pulse 90  Ht 6' (1.829 m)  Wt 278 lb (126.1 kg)  BMI 37.70 kg/m2  Physical Examination: General: Well developed, well nourished, NAD HEENT: OP clear, mucus membranes moist SKIN: warm, dry. No rashes. Neuro: No focal deficits Musculoskeletal: Muscle strength 5/5 all ext Psychiatric: Mood and affect normal Neck: No JVD, no carotid bruits, no thyromegaly, no lymphadenopathy. Lungs:Clear bilaterally, no wheezes, rhonci, crackles Cardiovascular: Regular rate and rhythm. No murmurs, gallops or rubs. Abdomen:Soft. Bowel sounds present. Non-tender.  Extremities: No lower extremity edema. Pulses are 2 + in the bilateral DP/PT.  Cardiac cath 04/24/13: Left main: No obstructive disease noted.  Left Anterior Descending Artery: Large caliber vessel that does not reach the apex. There are minor luminal  irregularities in the mid vessel. The diagonal branch is moderate in caliber and has no obstructive disease.  Circumflex Artery: Large caliber vessel with moderate caliber intermediate branch followed by two obtuse marginal branches. The intermediate branch has minor plaque disease. The two obtuse marginal branches have minor plaque disease. The stent in the distal AV groove Circumflex is patent with no restenosis.  Right Coronary Artery: Large caliber, dominant vessel that wraps around the apex. The proximal vessel has minor luminal irregularities. The mid vessel has diffuse 30% stenosis. The PDA is very large and reaches the apex. The PDA has minor plaque disease. The Posterolateral branch is a moderate caliber bifurcating vessel. The ostium of the posterolateral branch has 40% stenosis. The distal portion of this vessel become small in caliber then bifurcates and there is a 70% stenosis at the bifurcation involving both the superior and inferior sub-branches. This is unchanged in appearance since his last cath in 2011. This vessel is too small for PCI.  Left Ventricular Angiogram: LVEF=55-60%.   Assessment and Plan:   1. CAD, NATIVE VESSEL: Stable. Will continue ASA, statin, beta blocker, ARB, Imdur.   2. DVT: Will continue coumadin. This is also used for his neurological changes with possible very small PFO and prior CVA/TIA.   3. GERD: Continue Protonix 40 mg po QDaily.   4. HYPERTENSION : BP elevated. Will continue ARB but change Avapro to Cozaar 100 mg dialy. Continue Coreg. Increase Imdur to 30 mg BID.   5. PATENT FORAMEN OVALE: No evidence of significant PFO on TEE. He is on coumadin for small fenestrations in atrial septum and neurological changes. Recommend long term anti-coagulation.   6. OSA with daytime somnolence: Not wearing CPAP. He cannot afford. He needs to try to work this out as he really benefits from use.   7. Tobacco abuse, in remission: He stopped smoking April 2013.

## 2014-05-13 NOTE — Patient Instructions (Signed)
Your physician wants you to follow-up in:  6 months. You will receive a reminder letter in the mail two months in advance. If you don't receive a letter, please call our office to schedule the follow-up appointment.  Your physician has recommended you make the following change in your medication:   Stop Avapro. Start Cozaar 100 mg by mouth daily. Increase Isosorbide mononitrate to 30 mg by mouth twice daily.

## 2014-05-27 ENCOUNTER — Encounter: Payer: Medicare Other | Attending: Physical Medicine & Rehabilitation | Admitting: Registered Nurse

## 2014-05-27 ENCOUNTER — Encounter: Payer: Self-pay | Admitting: Registered Nurse

## 2014-05-27 ENCOUNTER — Ambulatory Visit (INDEPENDENT_AMBULATORY_CARE_PROVIDER_SITE_OTHER): Payer: Medicare Other | Admitting: *Deleted

## 2014-05-27 VITALS — BP 168/101 | HR 85 | Resp 16 | Ht 72.0 in | Wt 277.0 lb

## 2014-05-27 DIAGNOSIS — I82409 Acute embolism and thrombosis of unspecified deep veins of unspecified lower extremity: Secondary | ICD-10-CM | POA: Diagnosis not present

## 2014-05-27 DIAGNOSIS — Z79899 Other long term (current) drug therapy: Secondary | ICD-10-CM | POA: Diagnosis not present

## 2014-05-27 DIAGNOSIS — Z5181 Encounter for therapeutic drug level monitoring: Secondary | ICD-10-CM

## 2014-05-27 DIAGNOSIS — M1710 Unilateral primary osteoarthritis, unspecified knee: Secondary | ICD-10-CM | POA: Insufficient documentation

## 2014-05-27 DIAGNOSIS — I2510 Atherosclerotic heart disease of native coronary artery without angina pectoris: Secondary | ICD-10-CM

## 2014-05-27 DIAGNOSIS — IMO0002 Reserved for concepts with insufficient information to code with codable children: Secondary | ICD-10-CM | POA: Diagnosis not present

## 2014-05-27 DIAGNOSIS — Z7901 Long term (current) use of anticoagulants: Secondary | ICD-10-CM | POA: Diagnosis not present

## 2014-05-27 DIAGNOSIS — I6350 Cerebral infarction due to unspecified occlusion or stenosis of unspecified cerebral artery: Secondary | ICD-10-CM

## 2014-05-27 DIAGNOSIS — M1711 Unilateral primary osteoarthritis, right knee: Secondary | ICD-10-CM

## 2014-05-27 DIAGNOSIS — M1712 Unilateral primary osteoarthritis, left knee: Secondary | ICD-10-CM

## 2014-05-27 LAB — POCT INR: INR: 4.8

## 2014-05-27 MED ORDER — OXYCODONE HCL ER 40 MG PO T12A: 40.0000 mg | EXTENDED_RELEASE_TABLET | Freq: Two times a day (BID) | ORAL | Status: DC

## 2014-05-27 MED ORDER — OXYCODONE HCL 15 MG PO TABS: 15.0000 mg | ORAL_TABLET | ORAL | Status: DC | PRN

## 2014-05-27 NOTE — Progress Notes (Signed)
Subjective:    Patient ID: Tony Davis, male    DOB: 1961-06-07, 53 y.o.   MRN: [id number]  HPI: Mr Tony Davis is a 53 year old male who returns for follow up for chronic pain and medication refill. He says his pain is located bilateral knees. He rates his pain 8. His current exercise regime is walking and pool therapy. He arrive to clinic hypertensive. Blood pressure was rechecked blood pressure was  150/88.  Tony Davis was asking for his medications to be  Changed to immediate release only he states"  Do to the cost of Oxycontin 40 mg. Then proceeded to say his church was going to help him pay for his medications. I was in contact with Dr. Naaman Davis this changed will not be done. Patient verbalized understanding. Also began to tell this writer what milligrams are available in oxycodone, I explained to him the importance of following his medication regime and following his contract.  Pain Inventory Average Pain 9 Pain Right Now 8 My pain is sharp and aching  In the last 24 hours, has pain interfered with the following? General activity 8 Relation with others 8 Enjoyment of life 8 What TIME of day is your pain at its worst? evening Sleep (in general) Fair  Pain is worse with: walking, bending, sitting and standing Pain improves with: rest, heat/ice and medication Relief from Meds: 7  Mobility use a cane ability to climb steps?  no do you drive?  no  Function not employed: date last employed .  Neuro/Psych trouble walking  Prior Studies Any changes since last visit?  no  Physicians involved in your care Any changes since last visit?  no   Family History  Problem Relation Age of Onset  . Cancer Neg Hx   . Kidney disease Neg Hx   . Diabetes Neg Hx    History   Social History  . Marital Status: Married    Spouse Name: N/A    Number of Children: N/A  . Years of Education: N/A   Social History Main Topics  . Smoking status: Former Smoker -- 1.00 packs/day  for 35 years    Types: Cigarettes    Quit date: 03/10/2012  . Smokeless tobacco: Never Used  . Alcohol Use: No  . Drug Use: No  . Sexual Activity: Yes   Other Topics Concern  . None   Social History Narrative   Married, 5 kids; lives in [place]. 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    Past Surgical History  Procedure Laterality Date  . Knee arthroscopy Bilateral 2000's    2 on left and 3 on rt  . Tee without cardioversion  03/21/2012    Procedure: TRANSESOPHAGEAL ECHOCARDIOGRAM (TEE);  Surgeon: Tony Records, MD;  Location: [facility] ENDOSCOPY;  Service: Cardiovascular;  Laterality: N/A;  . Coronary angioplasty with stent placement  2012    "1" (09/24/2013)   Past Medical History  Diagnosis Date  . GERD (gastroesophageal reflux disease)   . HLD (hyperlipidemia)   . HTN (hypertension)   . Hematuria 8/12  . PFO (patent foramen ovale)     echo 8/12: EF 55-65%, grade 2 diast dysfnx; +PFO on bubble study;  TEE 4/13: EF normal, atrial septum with suspicion for interatrial septum fenestrations without flow across and few large bubbles noted in LA.  This was not felt to require closure   . CAD (coronary artery disease)     a.  s/p promus DES circumflex artery 10/13/10. b. Cath 04/2013: stable disease but possible small vessel disease -Imdur added.  Marland Kitchen DVT (deep venous thrombosis) 2013    "right"   . TIA (transient ischemic attack)     "1-2; both after the one in 02/2010" (09/25/2013)  . OSA on CPAP   . Type II diabetes mellitus     June 2014 AIC 6.2%/notes 09/24/2013; pt denies this hx on 09/25/2013  . Arthritis     "knees" (09/25/2013)  . Rheumatoid arthritis(714.0)   . CVA (cerebral vascular accident) 02/2010    "lost peripheral vision when I had the stroke; some memory issues since" (09/25/2013)   BP 168/101  Pulse 85  Resp 16  Ht 6' (1.829 m)  Wt 277 lb (125.646 kg)  BMI 37.56 kg/m2  SpO2 98%  Opioid Risk Score:   Fall Risk Score: Moderate Fall Risk (6-13  points) (patient educated handout declined)   Review of Systems  Respiratory: Positive for apnea.   Musculoskeletal: Positive for gait problem.  All other systems reviewed and are negative.      Objective:   Physical Exam  Nursing note and vitals reviewed. Constitutional: He is oriented to person, place, and time. He appears well-developed and well-nourished.  HENT:  Head: Normocephalic and atraumatic.  Neck: Normal range of motion. Neck supple.  Cardiovascular: Normal rate and regular rhythm.   Pulmonary/Chest: Effort normal and breath sounds normal.  Musculoskeletal:  Normal Muscle Bulk and Muscle testing Reveals: Upper Extremities  Full ROM and Muscle Strength right 5/5 and left 4/5 Back without spinal or paraspinal tenderness  Lower Extremities: Bilateral Flexion Produces Pain into the Patella Left greater than right. Tenderness with palpation Uses cane for support Arises from chair with ease  Neurological: He is alert and oriented to person, place, and time.  Skin: Skin is warm and dry.  Psychiatric: He has a normal mood and affect.          Assessment & Plan:  1. Bilateral knee pain d/t endstage OA. Awaiting TKR. Dr. Earleen Davis. He says he's not ready to have the surgery at this moment.  Refilled: OxyCODONE 40mg  one tablet every 12 hrs #60 and oxyCodone 15 mg every 8 hours as needed #90.  2. HTN: Blood Pressure rechecked 150/88. PMD and Cardiologist Following.  20 minutes of face to face patient care time was spent during this visit all questions were encouraged and answered.   F/U in 1 month

## 2014-06-13 DIAGNOSIS — J328 Other chronic sinusitis: Secondary | ICD-10-CM | POA: Diagnosis not present

## 2014-06-13 DIAGNOSIS — K219 Gastro-esophageal reflux disease without esophagitis: Secondary | ICD-10-CM | POA: Diagnosis present

## 2014-06-13 DIAGNOSIS — R0989 Other specified symptoms and signs involving the circulatory and respiratory systems: Secondary | ICD-10-CM | POA: Diagnosis not present

## 2014-06-13 DIAGNOSIS — Z452 Encounter for adjustment and management of vascular access device: Secondary | ICD-10-CM | POA: Diagnosis not present

## 2014-06-13 DIAGNOSIS — G9349 Other encephalopathy: Secondary | ICD-10-CM | POA: Diagnosis not present

## 2014-06-13 DIAGNOSIS — E119 Type 2 diabetes mellitus without complications: Secondary | ICD-10-CM | POA: Diagnosis not present

## 2014-06-13 DIAGNOSIS — R220 Localized swelling, mass and lump, head: Secondary | ICD-10-CM | POA: Diagnosis not present

## 2014-06-13 DIAGNOSIS — J189 Pneumonia, unspecified organism: Secondary | ICD-10-CM | POA: Diagnosis not present

## 2014-06-13 DIAGNOSIS — I999 Unspecified disorder of circulatory system: Secondary | ICD-10-CM | POA: Diagnosis not present

## 2014-06-13 DIAGNOSIS — I82629 Acute embolism and thrombosis of deep veins of unspecified upper extremity: Secondary | ICD-10-CM | POA: Diagnosis not present

## 2014-06-13 DIAGNOSIS — J690 Pneumonitis due to inhalation of food and vomit: Secondary | ICD-10-CM | POA: Diagnosis not present

## 2014-06-13 DIAGNOSIS — I824Z9 Acute embolism and thrombosis of unspecified deep veins of unspecified distal lower extremity: Secondary | ICD-10-CM | POA: Diagnosis not present

## 2014-06-13 DIAGNOSIS — Z86718 Personal history of other venous thrombosis and embolism: Secondary | ICD-10-CM | POA: Diagnosis not present

## 2014-06-13 DIAGNOSIS — A419 Sepsis, unspecified organism: Secondary | ICD-10-CM | POA: Diagnosis not present

## 2014-06-13 DIAGNOSIS — I629 Nontraumatic intracranial hemorrhage, unspecified: Secondary | ICD-10-CM | POA: Diagnosis not present

## 2014-06-13 DIAGNOSIS — I824Y9 Acute embolism and thrombosis of unspecified deep veins of unspecified proximal lower extremity: Secondary | ICD-10-CM | POA: Diagnosis not present

## 2014-06-13 DIAGNOSIS — I10 Essential (primary) hypertension: Secondary | ICD-10-CM | POA: Diagnosis not present

## 2014-06-13 DIAGNOSIS — I6789 Other cerebrovascular disease: Secondary | ICD-10-CM | POA: Diagnosis not present

## 2014-06-13 DIAGNOSIS — Z8673 Personal history of transient ischemic attack (TIA), and cerebral infarction without residual deficits: Secondary | ICD-10-CM | POA: Diagnosis not present

## 2014-06-13 DIAGNOSIS — Z87891 Personal history of nicotine dependence: Secondary | ICD-10-CM | POA: Diagnosis not present

## 2014-06-13 DIAGNOSIS — R404 Transient alteration of awareness: Secondary | ICD-10-CM | POA: Diagnosis not present

## 2014-06-13 DIAGNOSIS — I2510 Atherosclerotic heart disease of native coronary artery without angina pectoris: Secondary | ICD-10-CM | POA: Diagnosis not present

## 2014-06-13 DIAGNOSIS — G319 Degenerative disease of nervous system, unspecified: Secondary | ICD-10-CM | POA: Diagnosis not present

## 2014-06-13 DIAGNOSIS — G450 Vertebro-basilar artery syndrome: Secondary | ICD-10-CM | POA: Diagnosis not present

## 2014-06-13 DIAGNOSIS — Z430 Encounter for attention to tracheostomy: Secondary | ICD-10-CM | POA: Diagnosis not present

## 2014-06-13 DIAGNOSIS — R000 Tachycardia, unspecified: Secondary | ICD-10-CM | POA: Diagnosis not present

## 2014-06-13 DIAGNOSIS — I619 Nontraumatic intracerebral hemorrhage, unspecified: Secondary | ICD-10-CM | POA: Diagnosis not present

## 2014-06-13 DIAGNOSIS — I517 Cardiomegaly: Secondary | ICD-10-CM | POA: Diagnosis not present

## 2014-06-13 DIAGNOSIS — T148XXA Other injury of unspecified body region, initial encounter: Secondary | ICD-10-CM | POA: Diagnosis not present

## 2014-06-13 DIAGNOSIS — I82409 Acute embolism and thrombosis of unspecified deep veins of unspecified lower extremity: Secondary | ICD-10-CM | POA: Diagnosis not present

## 2014-06-13 DIAGNOSIS — D72829 Elevated white blood cell count, unspecified: Secondary | ICD-10-CM | POA: Diagnosis not present

## 2014-06-13 DIAGNOSIS — J0100 Acute maxillary sinusitis, unspecified: Secondary | ICD-10-CM | POA: Diagnosis not present

## 2014-06-13 DIAGNOSIS — J90 Pleural effusion, not elsewhere classified: Secondary | ICD-10-CM | POA: Diagnosis not present

## 2014-06-13 DIAGNOSIS — R6511 Systemic inflammatory response syndrome (SIRS) of non-infectious origin with acute organ dysfunction: Secondary | ICD-10-CM | POA: Diagnosis present

## 2014-06-13 DIAGNOSIS — R509 Fever, unspecified: Secondary | ICD-10-CM | POA: Diagnosis not present

## 2014-06-13 DIAGNOSIS — IMO0001 Reserved for inherently not codable concepts without codable children: Secondary | ICD-10-CM | POA: Diagnosis present

## 2014-06-13 DIAGNOSIS — N179 Acute kidney failure, unspecified: Secondary | ICD-10-CM | POA: Diagnosis not present

## 2014-06-13 DIAGNOSIS — E1065 Type 1 diabetes mellitus with hyperglycemia: Secondary | ICD-10-CM | POA: Diagnosis not present

## 2014-06-13 DIAGNOSIS — G910 Communicating hydrocephalus: Secondary | ICD-10-CM | POA: Diagnosis not present

## 2014-06-13 DIAGNOSIS — J9600 Acute respiratory failure, unspecified whether with hypoxia or hypercapnia: Secondary | ICD-10-CM | POA: Diagnosis not present

## 2014-06-13 DIAGNOSIS — E669 Obesity, unspecified: Secondary | ICD-10-CM | POA: Diagnosis present

## 2014-06-13 DIAGNOSIS — D649 Anemia, unspecified: Secondary | ICD-10-CM | POA: Diagnosis present

## 2014-06-13 DIAGNOSIS — R58 Hemorrhage, not elsewhere classified: Secondary | ICD-10-CM | POA: Diagnosis not present

## 2014-06-13 DIAGNOSIS — G911 Obstructive hydrocephalus: Secondary | ICD-10-CM | POA: Diagnosis not present

## 2014-06-13 DIAGNOSIS — J156 Pneumonia due to other aerobic Gram-negative bacteria: Secondary | ICD-10-CM | POA: Diagnosis not present

## 2014-06-13 DIAGNOSIS — R918 Other nonspecific abnormal finding of lung field: Secondary | ICD-10-CM | POA: Diagnosis not present

## 2014-06-13 DIAGNOSIS — J9819 Other pulmonary collapse: Secondary | ICD-10-CM | POA: Diagnosis not present

## 2014-06-13 DIAGNOSIS — G9389 Other specified disorders of brain: Secondary | ICD-10-CM | POA: Diagnosis not present

## 2014-06-13 DIAGNOSIS — I82619 Acute embolism and thrombosis of superficial veins of unspecified upper extremity: Secondary | ICD-10-CM | POA: Diagnosis not present

## 2014-06-13 DIAGNOSIS — R221 Localized swelling, mass and lump, neck: Secondary | ICD-10-CM | POA: Diagnosis not present

## 2014-06-13 DIAGNOSIS — R0789 Other chest pain: Secondary | ICD-10-CM | POA: Diagnosis not present

## 2014-06-13 DIAGNOSIS — I119 Hypertensive heart disease without heart failure: Secondary | ICD-10-CM | POA: Diagnosis not present

## 2014-06-13 DIAGNOSIS — R4182 Altered mental status, unspecified: Secondary | ICD-10-CM | POA: Diagnosis not present

## 2014-06-13 DIAGNOSIS — Z4682 Encounter for fitting and adjustment of non-vascular catheter: Secondary | ICD-10-CM | POA: Diagnosis not present

## 2014-06-13 DIAGNOSIS — R0602 Shortness of breath: Secondary | ICD-10-CM | POA: Diagnosis not present

## 2014-06-13 DIAGNOSIS — J811 Chronic pulmonary edema: Secondary | ICD-10-CM | POA: Diagnosis not present

## 2014-06-13 DIAGNOSIS — E870 Hyperosmolality and hypernatremia: Secondary | ICD-10-CM | POA: Diagnosis not present

## 2014-06-13 DIAGNOSIS — R609 Edema, unspecified: Secondary | ICD-10-CM | POA: Diagnosis not present

## 2014-06-13 DIAGNOSIS — IMO0002 Reserved for concepts with insufficient information to code with codable children: Secondary | ICD-10-CM | POA: Diagnosis not present

## 2014-06-13 DIAGNOSIS — R0609 Other forms of dyspnea: Secondary | ICD-10-CM | POA: Diagnosis not present

## 2014-06-13 DIAGNOSIS — Z9861 Coronary angioplasty status: Secondary | ICD-10-CM | POA: Diagnosis not present

## 2014-06-13 DIAGNOSIS — M7989 Other specified soft tissue disorders: Secondary | ICD-10-CM | POA: Diagnosis not present

## 2014-06-13 DIAGNOSIS — Z4541 Encounter for adjustment and management of cerebrospinal fluid drainage device: Secondary | ICD-10-CM | POA: Diagnosis not present

## 2014-06-13 DIAGNOSIS — Z7901 Long term (current) use of anticoagulants: Secondary | ICD-10-CM | POA: Diagnosis not present

## 2014-06-13 DIAGNOSIS — R079 Chest pain, unspecified: Secondary | ICD-10-CM | POA: Diagnosis not present

## 2014-06-13 DIAGNOSIS — R1310 Dysphagia, unspecified: Secondary | ICD-10-CM | POA: Diagnosis not present

## 2014-06-23 ENCOUNTER — Other Ambulatory Visit: Payer: Self-pay | Admitting: *Deleted

## 2014-06-23 ENCOUNTER — Telehealth: Payer: Self-pay

## 2014-06-23 MED ORDER — WARFARIN SODIUM 7.5 MG PO TABS: ORAL_TABLET | ORAL | Status: DC

## 2014-06-23 NOTE — Telephone Encounter (Signed)
Refill done as requested 

## 2014-06-23 NOTE — Telephone Encounter (Signed)
Left message for pt to call and schedule CPE with PCP

## 2014-06-24 ENCOUNTER — Inpatient Hospital Stay (HOSPITAL_COMMUNITY)
Admission: AD | Admit: 2014-06-24 | Payer: Self-pay | Source: Other Acute Inpatient Hospital | Admitting: Pulmonary Disease

## 2014-06-24 ENCOUNTER — Telehealth: Payer: Self-pay

## 2014-06-24 NOTE — Telephone Encounter (Signed)
Let message for pt to call in regards to scheduling an OV with PCP to check a1c

## 2014-06-25 ENCOUNTER — Encounter: Payer: Medicare Other | Admitting: Registered Nurse

## 2014-07-08 ENCOUNTER — Inpatient Hospital Stay
Admission: AD | Admit: 2014-07-08 | Discharge: 2014-07-30 | Disposition: A | Discharge status: Rehabilitation Facility | Payer: Self-pay | Source: Ambulatory Visit | Attending: Internal Medicine | Admitting: Internal Medicine

## 2014-07-08 ENCOUNTER — Ambulatory Visit: Payer: Medicare Other | Admitting: Internal Medicine

## 2014-07-08 ENCOUNTER — Other Ambulatory Visit (HOSPITAL_COMMUNITY): Payer: Self-pay

## 2014-07-08 DIAGNOSIS — Z430 Encounter for attention to tracheostomy: Secondary | ICD-10-CM | POA: Diagnosis not present

## 2014-07-08 DIAGNOSIS — E785 Hyperlipidemia, unspecified: Secondary | ICD-10-CM | POA: Diagnosis present

## 2014-07-08 DIAGNOSIS — R0989 Other specified symptoms and signs involving the circulatory and respiratory systems: Secondary | ICD-10-CM | POA: Diagnosis not present

## 2014-07-08 DIAGNOSIS — I10 Essential (primary) hypertension: Secondary | ICD-10-CM | POA: Diagnosis present

## 2014-07-08 DIAGNOSIS — S066XAA Traumatic subarachnoid hemorrhage with loss of consciousness status unknown, initial encounter: Secondary | ICD-10-CM | POA: Diagnosis not present

## 2014-07-08 DIAGNOSIS — I279 Pulmonary heart disease, unspecified: Secondary | ICD-10-CM | POA: Diagnosis present

## 2014-07-08 DIAGNOSIS — G9340 Encephalopathy, unspecified: Secondary | ICD-10-CM | POA: Diagnosis not present

## 2014-07-08 DIAGNOSIS — Z431 Encounter for attention to gastrostomy: Secondary | ICD-10-CM | POA: Diagnosis not present

## 2014-07-08 DIAGNOSIS — G911 Obstructive hydrocephalus: Secondary | ICD-10-CM | POA: Diagnosis not present

## 2014-07-08 DIAGNOSIS — Z4682 Encounter for fitting and adjustment of non-vascular catheter: Secondary | ICD-10-CM | POA: Diagnosis not present

## 2014-07-08 DIAGNOSIS — J9600 Acute respiratory failure, unspecified whether with hypoxia or hypercapnia: Secondary | ICD-10-CM | POA: Diagnosis not present

## 2014-07-08 DIAGNOSIS — R5381 Other malaise: Secondary | ICD-10-CM | POA: Diagnosis present

## 2014-07-08 DIAGNOSIS — B9689 Other specified bacterial agents as the cause of diseases classified elsewhere: Secondary | ICD-10-CM | POA: Diagnosis not present

## 2014-07-08 DIAGNOSIS — J158 Pneumonia due to other specified bacteria: Secondary | ICD-10-CM | POA: Diagnosis not present

## 2014-07-08 DIAGNOSIS — J40 Bronchitis, not specified as acute or chronic: Secondary | ICD-10-CM | POA: Diagnosis not present

## 2014-07-08 DIAGNOSIS — D649 Anemia, unspecified: Secondary | ICD-10-CM | POA: Diagnosis present

## 2014-07-08 DIAGNOSIS — M1710 Unilateral primary osteoarthritis, unspecified knee: Secondary | ICD-10-CM | POA: Diagnosis not present

## 2014-07-08 DIAGNOSIS — S066X9A Traumatic subarachnoid hemorrhage with loss of consciousness of unspecified duration, initial encounter: Secondary | ICD-10-CM | POA: Diagnosis not present

## 2014-07-08 DIAGNOSIS — Z9911 Dependence on respirator [ventilator] status: Secondary | ICD-10-CM | POA: Diagnosis not present

## 2014-07-08 DIAGNOSIS — G8190 Hemiplegia, unspecified affecting unspecified side: Secondary | ICD-10-CM | POA: Diagnosis not present

## 2014-07-08 DIAGNOSIS — Z6841 Body Mass Index (BMI) 40.0 and over, adult: Secondary | ICD-10-CM | POA: Diagnosis not present

## 2014-07-08 DIAGNOSIS — Z930 Tracheostomy status: Secondary | ICD-10-CM | POA: Diagnosis not present

## 2014-07-08 DIAGNOSIS — R930 Abnormal findings on diagnostic imaging of skull and head, not elsewhere classified: Secondary | ICD-10-CM | POA: Diagnosis not present

## 2014-07-08 DIAGNOSIS — E119 Type 2 diabetes mellitus without complications: Secondary | ICD-10-CM | POA: Diagnosis present

## 2014-07-08 DIAGNOSIS — R6520 Severe sepsis without septic shock: Secondary | ICD-10-CM | POA: Diagnosis not present

## 2014-07-08 DIAGNOSIS — IMO0002 Reserved for concepts with insufficient information to code with codable children: Secondary | ICD-10-CM | POA: Diagnosis not present

## 2014-07-08 DIAGNOSIS — I619 Nontraumatic intracerebral hemorrhage, unspecified: Secondary | ICD-10-CM | POA: Diagnosis not present

## 2014-07-08 DIAGNOSIS — J42 Unspecified chronic bronchitis: Secondary | ICD-10-CM | POA: Diagnosis not present

## 2014-07-08 DIAGNOSIS — E46 Unspecified protein-calorie malnutrition: Secondary | ICD-10-CM | POA: Diagnosis not present

## 2014-07-08 DIAGNOSIS — Z5189 Encounter for other specified aftercare: Secondary | ICD-10-CM | POA: Diagnosis not present

## 2014-07-08 DIAGNOSIS — R4701 Aphasia: Secondary | ICD-10-CM | POA: Diagnosis not present

## 2014-07-08 DIAGNOSIS — A419 Sepsis, unspecified organism: Secondary | ICD-10-CM | POA: Diagnosis not present

## 2014-07-09 ENCOUNTER — Other Ambulatory Visit (HOSPITAL_COMMUNITY): Payer: Self-pay

## 2014-07-09 DIAGNOSIS — J9600 Acute respiratory failure, unspecified whether with hypoxia or hypercapnia: Secondary | ICD-10-CM | POA: Diagnosis not present

## 2014-07-09 LAB — BLOOD GAS, ARTERIAL
ACID-BASE EXCESS: 0.5 mmol/L (ref 0.0–2.0)
Bicarbonate: 23.4 mEq/L (ref 20.0–24.0)
FIO2: 0.4 %
O2 SAT: 99.4 %
PATIENT TEMPERATURE: 98.6
PH ART: 7.502 — AB (ref 7.350–7.450)
TCO2: 24.3 mmol/L (ref 0–100)
pCO2 arterial: 30.1 mmHg — ABNORMAL LOW (ref 35.0–45.0)
pO2, Arterial: 149 mmHg — ABNORMAL HIGH (ref 80.0–100.0)

## 2014-07-09 LAB — COMPREHENSIVE METABOLIC PANEL
ALK PHOS: 150 U/L — AB (ref 39–117)
ALT: 71 U/L — ABNORMAL HIGH (ref 0–53)
ANION GAP: 14 (ref 5–15)
AST: 52 U/L — ABNORMAL HIGH (ref 0–37)
Albumin: 2.6 g/dL — ABNORMAL LOW (ref 3.5–5.2)
BILIRUBIN TOTAL: 0.3 mg/dL (ref 0.3–1.2)
BUN: 27 mg/dL — ABNORMAL HIGH (ref 6–23)
CO2: 26 mEq/L (ref 19–32)
CREATININE: 1.04 mg/dL (ref 0.50–1.35)
Calcium: 9.2 mg/dL (ref 8.4–10.5)
Chloride: 105 mEq/L (ref 96–112)
GFR calc Af Amer: >90 mL/min (ref >90)
GFR calc non Af Amer: 82 mL/min — ABNORMAL LOW (ref >90)
GLUCOSE: 111 mg/dL — AB (ref 70–99)
POTASSIUM: 4.1 meq/L (ref 3.7–5.3)
Sodium: 145 mEq/L (ref 137–147)
Total Protein: 6.4 g/dL (ref 6.0–8.3)

## 2014-07-09 LAB — CBC
HCT: 27 % — ABNORMAL LOW (ref 39.0–52.0)
HEMOGLOBIN: 8.4 g/dL — AB (ref 13.0–17.0)
MCH: 29 pg (ref 26.0–34.0)
MCHC: 31.1 g/dL (ref 30.0–36.0)
MCV: 93.1 fL (ref 78.0–100.0)
Platelets: 450 10*3/uL — ABNORMAL HIGH (ref 150–400)
RBC: 2.9 MIL/uL — ABNORMAL LOW (ref 4.22–5.81)
RDW: 14 % (ref 11.5–15.5)
WBC: 11.7 10*3/uL — ABNORMAL HIGH (ref 4.0–10.5)

## 2014-07-09 LAB — PROCALCITONIN: Procalcitonin: <0.1 ng/mL

## 2014-07-09 LAB — TSH: TSH: 1.67 u[IU]/mL (ref 0.350–4.500)

## 2014-07-11 ENCOUNTER — Other Ambulatory Visit (HOSPITAL_COMMUNITY): Payer: Self-pay

## 2014-07-11 DIAGNOSIS — J42 Unspecified chronic bronchitis: Secondary | ICD-10-CM | POA: Diagnosis not present

## 2014-07-12 ENCOUNTER — Other Ambulatory Visit (HOSPITAL_COMMUNITY): Payer: Self-pay

## 2014-07-12 DIAGNOSIS — R930 Abnormal findings on diagnostic imaging of skull and head, not elsewhere classified: Secondary | ICD-10-CM | POA: Diagnosis not present

## 2014-07-12 NOTE — Progress Notes (Signed)
[facility]                                                                                              Progress note     Patient Demographics  Tony Davis, is a 53 y.o. male  [id number]  [id number]  DOB - 13-Jun-1961  Admit date - 07/08/2014  Admitting Physician Merton Border, MD  Outpatient Primary MD for the patient is [name], MD  LOS - 4   Chief complaint   Respiratory failure   Subarachnoid hemorrhage         Subjective:   [name] cannot give any history due to encephalopathy  Objective:   Vital signs  Temperature 99.1 Heart rate 78 Respiratory rate 18 Blood pressure 143/90 Pulse ox 94%    Exam Encephalopathic Eyes bulging bilaterally Supple Neck,No JVD, No cervical lymphadenopathy appriciated. Tracheostomy midline Symmetrical Chest wall movement, Good air movement bilaterally, mild scattered rhonchi RRR,No Gallops,Rubs or new Murmurs, No Parasternal Heave +ve B.Sounds, Abd Soft, Non tender, No organomegaly appriciated, No rebound - guarding or rigidity. No Cyanosis, Clubbing or edema, No new Rash or bruise     I&Os 2660/??   Data Review   CBC  Recent Labs Lab 07/09/14 0620  WBC 11.7*  HGB 8.4*  HCT 27.0*  PLT 450*  MCV 93.1  MCH 29.0  MCHC 31.1  RDW 14.0    Chemistries   Recent Labs Lab 07/09/14 0620  NA 145  K 4.1  CL 105  CO2 26  GLUCOSE 111*  BUN 27*  CREATININE 1.04  CALCIUM 9.2  AST 52*  ALT 71*  ALKPHOS 150*  BILITOT 0.3   ------------------------------------------------------------------------------------------------------------------ CrCl is unknown because both a height and weight (above a minimum accepted value) are required for this calculation. ------------------------------------------------------------------------------------------------------------------ No results found for this basename: HGBA1C,  in  the last 72 hours ------------------------------------------------------------------------------------------------------------------ No results found for this basename: CHOL, HDL, LDLCALC, TRIG, CHOLHDL, LDLDIRECT,  in the last 72 hours ------------------------------------------------------------------------------------------------------------------ No results found for this basename: TSH, T4TOTAL, FREET3, T3FREE, THYROIDAB,  in the last 72 hours ------------------------------------------------------------------------------------------------------------------ No results found for this basename: VITAMINB12, FOLATE, FERRITIN, TIBC, IRON, RETICCTPCT,  in the last 72 hours  Coagulation profile No results found for this basename: INR, PROTIME,  in the last 168 hours  No results found for this basename: DDIMER,  in the last 72 hours  Cardiac Enzymes No results found for this basename: CK, CKMB, TROPONINI, MYOGLOBIN,  in the last 168 hours ------------------------------------------------------------------------------------------------------------------ No components found with this basename: POCBNP,   Micro Results No results found for this or any previous visit (from the past 240 hour(s)).     Assessment & Plan   Acute respiratory failure; continue with ATC Intracerebral and subarachnoid hemorrhage status post VP shunt Encephalopathy no much improvement Procedure malnutrition on tube feeding per PEG Malignant hypertension controlled on metoprolol and amlodipine Diabetes mellitus type 2 on levemir & Insulin insulin sliding scale Left-sided pneumonia on meropenem Deep venous thrombosis status post inferior vena cava placement Generalized weakness complicated by encephalopathy; PT OT for treatment Hyperlipidemia continue treatment with statin  Code  Status: Full  DVT Prophylaxis  heparin   Merton Border M.D on 07/12/2014 at 11:24 AM

## 2014-07-13 NOTE — Progress Notes (Signed)
[facility]                                                                                              Progress note     Patient Demographics  Tony Davis, is a 53 y.o. male  [id number]  [id number]  DOB - 03-27-1961  Admit date - 07/08/2014  Admitting Physician Merton Border, MD  Outpatient Primary MD for the patient is [name], MD  LOS - 5   Chief complaint   Respiratory failure   Subarachnoid hemorrhage         Subjective:   Tony Davis cannot give any history due to encephalopathy  Objective:   Vital signs  Temperature is a 99.3 Heart rate 95 Respiratory rate  20  anB there is an alood pressure  127/84  Pulse ox 94%    Exam Encephalopathic Eyes bulging bilaterally Supple Neck,No JVD, No cervical lymphadenopathy appriciated. Tracheostomy midline Symmetrical Chest wall movement, Good air movement bilaterally, mild scattered rhonchi RRR,No Gallops,Rubs or new Murmurs, No Parasternal Heave +ve B.Sounds, Abd Soft, Non tender, No organomegaly appriciated, No rebound - guarding or rigidity. No Cyanosis, Clubbing or edema, No new Rash or bruise     I&Os  3820/??   Data Review   CBC  Recent Labs Lab 07/09/14 0620  WBC 11.7*  HGB 8.4*  HCT 27.0*  PLT 450*  MCV 93.1  MCH 29.0  MCHC 31.1  RDW 14.0    Chemistries   Recent Labs Lab 07/09/14 0620  NA 145  K 4.1  CL 105  CO2 26  GLUCOSE 111*  BUN 27*  CREATININE 1.04  CALCIUM 9.2  AST 52*  ALT 71*  ALKPHOS 150*  BILITOT 0.3   ------------------------------------------------------------------------------------------------------------------ CrCl is unknown because both a height and weight (above a minimum accepted value) are required for this calculation. ------------------------------------------------------------------------------------------------------------------ No results found for  this basename: HGBA1C,  in the last 72 hours ------------------------------------------------------------------------------------------------------------------ No results found for this basename: CHOL, HDL, LDLCALC, TRIG, CHOLHDL, LDLDIRECT,  in the last 72 hours ------------------------------------------------------------------------------------------------------------------ No results found for this basename: TSH, T4TOTAL, FREET3, T3FREE, THYROIDAB,  in the last 72 hours ------------------------------------------------------------------------------------------------------------------ No results found for this basename: VITAMINB12, FOLATE, FERRITIN, TIBC, IRON, RETICCTPCT,  in the last 72 hours  Coagulation profile No results found for this basename: INR, PROTIME,  in the last 168 hours  No results found for this basename: DDIMER,  in the last 72 hours  Cardiac Enzymes No results found for this basename: CK, CKMB, TROPONINI, MYOGLOBIN,  in the last 168 hours ------------------------------------------------------------------------------------------------------------------ No components found with this basename: POCBNP,   Micro Results No results found for this or any previous visit (from the past 240 hour(s)).     Assessment & Plan   Acute respiratory failure; continue with ATC Intracerebral and subarachnoid hemorrhage status post VP shunt Encephalopathy no much improvement Procedure malnutrition on tube feeding per PEG Malignant hypertension controlled on metoprolol and amlodipine Diabetes mellitus type 2 on levemir & Insulin insulin sliding scale Left-sided pneumonia on meropenem Deep venous thrombosis status post inferior vena cava placement Generalized weakness complicated by encephalopathy;  PT OT for treatment Hyperlipidemia continue treatment with statin  Plan  Fish oil/provigil  Code Status: Full  DVT Prophylaxis  heparin   Merton Border M.D on 07/13/2014 at 1:28  PM

## 2014-07-14 ENCOUNTER — Other Ambulatory Visit (HOSPITAL_COMMUNITY): Payer: Self-pay

## 2014-07-14 NOTE — Progress Notes (Signed)
[facility]                                                                                              Progress note     Patient Demographics  Tony Davis, is a 53 y.o. male  [id number]  [id number]  DOB - 03/14/1961  Admit date - 07/08/2014  Admitting Physician Merton Border, MD  Outpatient Primary MD for the patient is [name], MD  LOS - 6   Chief complaint   Respiratory failure   Subarachnoid hemorrhage         Subjective:   Tony Davis cannot give any history due to encephalopathy  Objective:   Vital signs  Temperature is a 99.2 Heart rate 93 Respiratory rate  20  anB there is an alood pressure  124/84  Pulse ox 94%    Exam Encephalopathic Eyes bulging bilaterally right pupil bigger than left and they're both sluggish to light Supple Neck,No JVD, No cervical lymphadenopathy appriciated. Tracheostomy midline Symmetrical Chest wall movement, Good air movement bilaterally, mild scattered rhonchi RRR,No Gallops,Rubs or new Murmurs, No Parasternal Heave +ve B.Sounds, Abd Soft, Non tender, No organomegaly appriciated, No rebound - guarding or rigidity. No Cyanosis, Clubbing or edema, No new Rash or bruise     I&Os  2740/??   Data Review   CBC  Recent Labs Lab 07/09/14 0620  WBC 11.7*  HGB 8.4*  HCT 27.0*  PLT 450*  MCV 93.1  MCH 29.0  MCHC 31.1  RDW 14.0    Chemistries   Recent Labs Lab 07/09/14 0620  NA 145  K 4.1  CL 105  CO2 26  GLUCOSE 111*  BUN 27*  CREATININE 1.04  CALCIUM 9.2  AST 52*  ALT 71*  ALKPHOS 150*  BILITOT 0.3   ------------------------------------------------------------------------------------------------------------------ CrCl is unknown because both a height and weight (above a minimum accepted value) are required for this  calculation. ------------------------------------------------------------------------------------------------------------------ No results found for this basename: HGBA1C,  in the last 72 hours ------------------------------------------------------------------------------------------------------------------ No results found for this basename: CHOL, HDL, LDLCALC, TRIG, CHOLHDL, LDLDIRECT,  in the last 72 hours ------------------------------------------------------------------------------------------------------------------ No results found for this basename: TSH, T4TOTAL, FREET3, T3FREE, THYROIDAB,  in the last 72 hours ------------------------------------------------------------------------------------------------------------------ No results found for this basename: VITAMINB12, FOLATE, FERRITIN, TIBC, IRON, RETICCTPCT,  in the last 72 hours  Coagulation profile No results found for this basename: INR, PROTIME,  in the last 168 hours  No results found for this basename: DDIMER,  in the last 72 hours  Cardiac Enzymes No results found for this basename: CK, CKMB, TROPONINI, MYOGLOBIN,  in the last 168 hours ------------------------------------------------------------------------------------------------------------------ No components found with this basename: POCBNP,   Micro Results No results found for this or any previous visit (from the past 240 hour(s)).     Assessment & Plan   Acute respiratory failure; continue with ATC Intracerebral and subarachnoid hemorrhage status post VP shunt Encephalopathy no much improvement Procedure malnutrition on tube feeding per PEG Malignant hypertension controlled on metoprolol and amlodipine Diabetes mellitus type 2 on levemir & Insulin insulin sliding scale Left-sided pneumonia on meropenem Deep venous thrombosis  status post inferior vena cava placement Generalized weakness complicated by encephalopathy; PT OT for treatment Hyperlipidemia  continue treatment with statin  Plan  Check labs in AM  Code Status: Full  DVT Prophylaxis  heparin   Merton Border M.D on 07/14/2014 at 2:05 PM

## 2014-07-15 ENCOUNTER — Other Ambulatory Visit (HOSPITAL_COMMUNITY): Payer: Self-pay

## 2014-07-15 ENCOUNTER — Other Ambulatory Visit: Payer: Self-pay | Admitting: Cardiovascular Disease

## 2014-07-15 DIAGNOSIS — J9600 Acute respiratory failure, unspecified whether with hypoxia or hypercapnia: Secondary | ICD-10-CM | POA: Diagnosis not present

## 2014-07-15 LAB — CBC
HCT: 31.8 % — ABNORMAL LOW (ref 39.0–52.0)
Hemoglobin: 9.5 g/dL — ABNORMAL LOW (ref 13.0–17.0)
MCH: 28.4 pg (ref 26.0–34.0)
MCHC: 29.9 g/dL — ABNORMAL LOW (ref 30.0–36.0)
MCV: 94.9 fL (ref 78.0–100.0)
PLATELETS: 379 10*3/uL (ref 150–400)
RBC: 3.35 MIL/uL — ABNORMAL LOW (ref 4.22–5.81)
RDW: 14.8 % (ref 11.5–15.5)
WBC: 9.8 10*3/uL (ref 4.0–10.5)

## 2014-07-15 LAB — BASIC METABOLIC PANEL
Anion gap: 13 (ref 5–15)
BUN: 20 mg/dL (ref 6–23)
CALCIUM: 9.5 mg/dL (ref 8.4–10.5)
CO2: 28 mEq/L (ref 19–32)
Chloride: 105 mEq/L (ref 96–112)
Creatinine, Ser: 0.87 mg/dL (ref 0.50–1.35)
GFR calc Af Amer: >90 mL/min (ref >90)
GLUCOSE: 112 mg/dL — AB (ref 70–99)
Potassium: 3.7 mEq/L (ref 3.7–5.3)
Sodium: 146 mEq/L (ref 137–147)

## 2014-07-15 NOTE — Progress Notes (Signed)
[facility]                                                                                              Progress note     Patient Demographics  Tony Davis, is a 53 y.o. male  [id number]  [id number]  DOB - 05-25-1961  Admit date - 07/08/2014  Admitting Physician Merton Border, MD  Outpatient Primary MD for the patient is [name], MD  LOS - 7   Chief complaint   Respiratory failure   Subarachnoid hemorrhage         Subjective:   [name] cannot give any history due to encephalopathy  Objective:   Vital signs  Temperature is a 99.2 Heart rate 99 Respiratory rate  18  anB there is an alood pressure  144/95  Pulse ox 98%    Exam Clinical bit more responsive Eyes bulging bilaterally right pupil bigger than left and they're both sluggish to light Supple Neck,No JVD, No cervical lymphadenopathy appriciated. Tracheostomy midline Symmetrical Chest wall movement, Good air movement bilaterally, mild scattered rhonchi RRR,No Gallops,Rubs or new Murmurs, No Parasternal Heave +ve B.Sounds, Abd Soft, Non tender, No organomegaly appriciated, No rebound - guarding or rigidity. No Cyanosis, Clubbing or edema, No new Rash or bruise     I&Os unknown   Data Review   CBC  Recent Labs Lab 07/09/14 0620 07/15/14 0630  WBC 11.7* 9.8  HGB 8.4* 9.5*  HCT 27.0* 31.8*  PLT 450* 379  MCV 93.1 94.9  MCH 29.0 28.4  MCHC 31.1 29.9*  RDW 14.0 14.8    Chemistries   Recent Labs Lab 07/09/14 0620 07/15/14 0630  NA 145 146  K 4.1 3.7  CL 105 105  CO2 26 28  GLUCOSE 111* 112*  BUN 27* 20  CREATININE 1.04 0.87  CALCIUM 9.2 9.5  AST 52*  --   ALT 71*  --   ALKPHOS 150*  --   BILITOT 0.3  --    ------------------------------------------------------------------------------------------------------------------ CrCl is unknown because both a height and weight (above a  minimum accepted value) are required for this calculation. ------------------------------------------------------------------------------------------------------------------ No results found for this basename: HGBA1C,  in the last 72 hours ------------------------------------------------------------------------------------------------------------------ No results found for this basename: CHOL, HDL, LDLCALC, TRIG, CHOLHDL, LDLDIRECT,  in the last 72 hours ------------------------------------------------------------------------------------------------------------------ No results found for this basename: TSH, T4TOTAL, FREET3, T3FREE, THYROIDAB,  in the last 72 hours ------------------------------------------------------------------------------------------------------------------ No results found for this basename: VITAMINB12, FOLATE, FERRITIN, TIBC, IRON, RETICCTPCT,  in the last 72 hours  Coagulation profile No results found for this basename: INR, PROTIME,  in the last 168 hours  No results found for this basename: DDIMER,  in the last 72 hours  Cardiac Enzymes No results found for this basename: CK, CKMB, TROPONINI, MYOGLOBIN,  in the last 168 hours ------------------------------------------------------------------------------------------------------------------ No components found with this basename: POCBNP,   Micro Results No results found for this or any previous visit (from the past 240 hour(s)).     Assessment & Plan   Acute respiratory failure; continue with ATC Intracerebral and subarachnoid hemorrhage status post VP shunt Encephalopathy slight  improvement Protein calorie malnutrition on tube feeding per PEG Malignant hypertension controlled on metoprolol and amlodipine Diabetes mellitus type 2 on levemir & Insulin insulin sliding scale Left-sided pneumonia on meropenem Deep venous thrombosis status post inferior vena cava placement Generalized weakness complicated by  encephalopathy; PT OT for treatment Hyperlipidemia continue treatment with statin  Plan  Continue same treatment Monitor  Code Status: Full  DVT Prophylaxis  heparin   Merton Border M.D on 07/15/2014 at 2:47 PM

## 2014-07-16 NOTE — Progress Notes (Signed)
[facility]                                                                                              Progress note     Patient Demographics  Tony Davis, is a 53 y.o. male  [id number]  [id number]  DOB - August 26, 1961  Admit date - 07/08/2014  Admitting Physician Merton Border, MD  Outpatient Primary MD for the patient is [name], MD  LOS - 8   Chief complaint   Respiratory failure   Subarachnoid hemorrhage         Subjective:   [name] cannot give any history due to encephalopathy  Objective:   Vital signs  Temperature is a 98.6 Heart rate 80 Respiratory rate  18  anB there is an alood pressure  138/66 Pulse ox 94%    Exam Clinical bit more responsive Eyes bulging bilaterally right pupil bigger than left and they're both sluggish to light Supple Neck,No JVD, No cervical lymphadenopathy appriciated. Tracheostomy midline Symmetrical Chest wall movement, Good air movement bilaterally, mild scattered rhonchi RRR,No Gallops,Rubs or new Murmurs, No Parasternal Heave +ve B.Sounds, Abd Soft, Non tender, No organomegaly appriciated, No rebound - guarding or rigidity. No Cyanosis, Clubbing or edema, No new Rash or bruise     I&Os unknown   Data Review   CBC  Recent Labs Lab 07/15/14 0630  WBC 9.8  HGB 9.5*  HCT 31.8*  PLT 379  MCV 94.9  MCH 28.4  MCHC 29.9*  RDW 14.8    Chemistries   Recent Labs Lab 07/15/14 0630  NA 146  K 3.7  CL 105  CO2 28  GLUCOSE 112*  BUN 20  CREATININE 0.87  CALCIUM 9.5   ------------------------------------------------------------------------------------------------------------------ CrCl is unknown because both a height and weight (above a minimum accepted value) are required for this  calculation. ------------------------------------------------------------------------------------------------------------------ No results found for this basename: HGBA1C,  in the last 72 hours ------------------------------------------------------------------------------------------------------------------ No results found for this basename: CHOL, HDL, LDLCALC, TRIG, CHOLHDL, LDLDIRECT,  in the last 72 hours ------------------------------------------------------------------------------------------------------------------ No results found for this basename: TSH, T4TOTAL, FREET3, T3FREE, THYROIDAB,  in the last 72 hours ------------------------------------------------------------------------------------------------------------------ No results found for this basename: VITAMINB12, FOLATE, FERRITIN, TIBC, IRON, RETICCTPCT,  in the last 72 hours  Coagulation profile No results found for this basename: INR, PROTIME,  in the last 168 hours  No results found for this basename: DDIMER,  in the last 72 hours  Cardiac Enzymes No results found for this basename: CK, CKMB, TROPONINI, MYOGLOBIN,  in the last 168 hours ------------------------------------------------------------------------------------------------------------------ No components found with this basename: POCBNP,   Micro Results No results found for this or any previous visit (from the past 240 hour(s)).     Assessment & Plan   Acute respiratory failure; continue with ATC Intracerebral and subarachnoid hemorrhage status post VP shunt Encephalopathy slight improvement Protein calorie malnutrition on tube feeding per PEG Malignant hypertension controlled on metoprolol and amlodipine Diabetes mellitus type 2 on levemir & Insulin insulin sliding scale Left-sided pneumonia on meropenem Deep venous thrombosis status post inferior vena cava placement Generalized weakness complicated by encephalopathy;  PT OT for  treatment Hyperlipidemia continue treatment with statin  Plan  Continue same treatment Check labs on Monday  Code Status: Full  DVT Prophylaxis  heparin   Merton Border M.D on 07/16/2014 at 12:10 PM

## 2014-07-17 NOTE — Progress Notes (Signed)
[facility]                                                                                              Progress note     Patient Demographics  Tony Davis, is a 53 y.o. male  [id number]  [id number]  DOB - [birth year]  Admit date - 07/08/2014  Admitting Physician Merton Border, MD  Outpatient Primary MD for the patient is [name], MD  LOS - 9   Chief complaint   Respiratory failure   Subarachnoid hemorrhage         Subjective:   Tony Davis cannot give any history due to encephalopathy  Objective:   Vital signs  Temperature is a 98.6 Heart rate 89 Respiratory rate  18  anB there is an alood pressure  146/74 Pulse ox 98%    Exam  more responsive Eyes bulging bilaterally right pupil bigger than left and they're both sluggish to light Supple Neck,No JVD, No cervical lymphadenopathy appriciated. Tracheostomy midline Symmetrical Chest wall movement, Good air movement bilaterally, mild scattered rhonchi RRR,No Gallops,Rubs or new Murmurs, No Parasternal Heave +ve B.Sounds, Abd Soft, Non tender, No organomegaly appriciated, No rebound - guarding or rigidity. No Cyanosis, Clubbing or edema, No new Rash or bruise     I&Os unknown   Data Review   CBC  Recent Labs Lab 07/15/14 0630  WBC 9.8  HGB 9.5*  HCT 31.8*  PLT 379  MCV 94.9  MCH 28.4  MCHC 29.9*  RDW 14.8    Chemistries   Recent Labs Lab 07/15/14 0630  NA 146  K 3.7  CL 105  CO2 28  GLUCOSE 112*  BUN 20  CREATININE 0.87  CALCIUM 9.5   ------------------------------------------------------------------------------------------------------------------ CrCl is unknown because both a height and weight (above a minimum accepted value) are required for this calculation. ------------------------------------------------------------------------------------------------------------------ No results  found for this basename: HGBA1C,  in the last 72 hours ------------------------------------------------------------------------------------------------------------------ No results found for this basename: CHOL, HDL, LDLCALC, TRIG, CHOLHDL, LDLDIRECT,  in the last 72 hours ------------------------------------------------------------------------------------------------------------------ No results found for this basename: TSH, T4TOTAL, FREET3, T3FREE, THYROIDAB,  in the last 72 hours ------------------------------------------------------------------------------------------------------------------ No results found for this basename: VITAMINB12, FOLATE, FERRITIN, TIBC, IRON, RETICCTPCT,  in the last 72 hours  Coagulation profile No results found for this basename: INR, PROTIME,  in the last 168 hours  No results found for this basename: DDIMER,  in the last 72 hours  Cardiac Enzymes No results found for this basename: CK, CKMB, TROPONINI, MYOGLOBIN,  in the last 168 hours ------------------------------------------------------------------------------------------------------------------ No components found with this basename: POCBNP,   Micro Results No results found for this or any previous visit (from the past 240 hour(s)).     Assessment & Plan   Acute respiratory failure; continue with ATC Intracerebral and subarachnoid hemorrhage status post VP shunt Encephalopathy slight improvement Protein calorie malnutrition on tube feeding per PEG Malignant hypertension controlled on metoprolol and amlodipine Diabetes mellitus type 2 on levemir & Insulin insulin sliding scale Left-sided pneumonia on meropenem Deep venous thrombosis status post inferior vena cava placement Generalized weakness complicated by encephalopathy; PT  OT for treatment Hyperlipidemia continue treatment with statin  Plan  Continue same treatment Check labs on Monday  Code Status: Full  DVT Prophylaxis   heparin   Merton Border M.D on 07/17/2014 at 4:07 PM

## 2014-07-18 NOTE — Progress Notes (Signed)
[facility]                                                                                              Progress note     Patient Demographics  Tony Davis, is a 53 y.o. male  [id number]  [id number]  DOB - [birth year]  Admit date - 07/08/2014  Admitting Physician Merton Border, MD  Outpatient Primary MD for the patient is [name], MD  LOS - 10   Chief complaint   Respiratory failure   Subarachnoid hemorrhage         Subjective:   Tony Davis cannot give any history due to encephalopathy  Objective:   Vital signs  Temperature is a 98.6 Heart rate 84 Respiratory rate  18  anB there is an alood pressure  142/60 Pulse ox 95%    Exam  more responsive Eyes bulging bilaterally right pupil bigger than left and they're both sluggish to light Supple Neck,No JVD, No cervical lymphadenopathy appriciated. Tracheostomy midline Symmetrical Chest wall movement, Good air movement bilaterally, mild scattered rhonchi RRR,No Gallops,Rubs or new Murmurs, No Parasternal Heave +ve B.Sounds, Abd Soft, Non tender, No organomegaly appriciated, No rebound - guarding or rigidity. No Cyanosis, Clubbing or edema, No new Rash or bruise     I&Os unknown   Data Review   CBC  Recent Labs Lab 07/15/14 0630  WBC 9.8  HGB 9.5*  HCT 31.8*  PLT 379  MCV 94.9  MCH 28.4  MCHC 29.9*  RDW 14.8    Chemistries   Recent Labs Lab 07/15/14 0630  NA 146  K 3.7  CL 105  CO2 28  GLUCOSE 112*  BUN 20  CREATININE 0.87  CALCIUM 9.5   ------------------------------------------------------------------------------------------------------------------ CrCl is unknown because both a height and weight (above a minimum accepted value) are required for this calculation. ------------------------------------------------------------------------------------------------------------------ No results  found for this basename: HGBA1C,  in the last 72 hours ------------------------------------------------------------------------------------------------------------------ No results found for this basename: CHOL, HDL, LDLCALC, TRIG, CHOLHDL, LDLDIRECT,  in the last 72 hours ------------------------------------------------------------------------------------------------------------------ No results found for this basename: TSH, T4TOTAL, FREET3, T3FREE, THYROIDAB,  in the last 72 hours ------------------------------------------------------------------------------------------------------------------ No results found for this basename: VITAMINB12, FOLATE, FERRITIN, TIBC, IRON, RETICCTPCT,  in the last 72 hours  Coagulation profile No results found for this basename: INR, PROTIME,  in the last 168 hours  No results found for this basename: DDIMER,  in the last 72 hours  Cardiac Enzymes No results found for this basename: CK, CKMB, TROPONINI, MYOGLOBIN,  in the last 168 hours ------------------------------------------------------------------------------------------------------------------ No components found with this basename: POCBNP,   Micro Results No results found for this or any previous visit (from the past 240 hour(s)).     Assessment & Plan   Acute respiratory failure; continue with ATC Intracerebral and subarachnoid hemorrhage status post VP shunt Encephalopathy slight improvement Protein calorie malnutrition on tube feeding per PEG Malignant hypertension controlled on metoprolol and amlodipine Diabetes mellitus type 2 on levemir & Insulin insulin sliding scale Left-sided pneumonia on meropenem Deep venous thrombosis status post inferior vena cava placement Generalized weakness complicated by encephalopathy; PT  OT for treatment Hyperlipidemia continue treatment with statin  Plan  Continue same treatment Check labs in a.m.  Code Status: Full  DVT Prophylaxis   heparin   Merton Border M.D on 07/18/2014 at 1:15 PM

## 2014-07-19 ENCOUNTER — Other Ambulatory Visit (HOSPITAL_COMMUNITY): Payer: Self-pay

## 2014-07-19 DIAGNOSIS — J9600 Acute respiratory failure, unspecified whether with hypoxia or hypercapnia: Secondary | ICD-10-CM | POA: Diagnosis not present

## 2014-07-19 LAB — CBC
HCT: 30.2 % — ABNORMAL LOW (ref 39.0–52.0)
Hemoglobin: 9.4 g/dL — ABNORMAL LOW (ref 13.0–17.0)
MCH: 29.7 pg (ref 26.0–34.0)
MCHC: 31.1 g/dL (ref 30.0–36.0)
MCV: 95.6 fL (ref 78.0–100.0)
PLATELETS: 256 10*3/uL (ref 150–400)
RBC: 3.16 MIL/uL — AB (ref 4.22–5.81)
RDW: 15 % (ref 11.5–15.5)
WBC: 7 10*3/uL (ref 4.0–10.5)

## 2014-07-19 LAB — BASIC METABOLIC PANEL
ANION GAP: 13 (ref 5–15)
BUN: 17 mg/dL (ref 6–23)
CHLORIDE: 104 meq/L (ref 96–112)
CO2: 28 mEq/L (ref 19–32)
Calcium: 9.5 mg/dL (ref 8.4–10.5)
Creatinine, Ser: 0.75 mg/dL (ref 0.50–1.35)
GFR calc non Af Amer: >90 mL/min (ref >90)
Glucose, Bld: 130 mg/dL — ABNORMAL HIGH (ref 70–99)
POTASSIUM: 3.4 meq/L — AB (ref 3.7–5.3)
Sodium: 145 mEq/L (ref 137–147)

## 2014-07-19 NOTE — Progress Notes (Signed)
[facility]                                                                                              Progress note     Patient Demographics  Tony Davis, is a 53 y.o. male  [id number]  [id number]  DOB - [birth year]  Admit date - 07/08/2014  Admitting Physician Merton Border, MD  Outpatient Primary MD for the patient is [name], MD  LOS - 31   Chief complaint   Respiratory failure   Subarachnoid hemorrhage         Subjective:   Tony Davis cannot give any history due to encephalopathy  Objective:   Vital signs  Temperature is a 99.4 Heart rate 88 Respiratory rate  20  Blood pressure 142/90 Pulse ox 100%    Exam  more responsive Eyes bulging bilaterally right pupil bigger than left and they're both sluggish to light Supple Neck,No JVD, No cervical lymphadenopathy appriciated. Tracheostomy midline Symmetrical Chest wall movement, Good air movement bilaterally, mild scattered rhonchi RRR,No Gallops,Rubs or new Murmurs, No Parasternal Heave +ve B.Sounds, Abd Soft, Non tender, No organomegaly appriciated, No rebound - guarding or rigidity. No Cyanosis, Clubbing or edema, No new Rash or bruise     I&Os unknown   Data Review   CBC  Recent Labs Lab 07/15/14 0630 07/19/14 0525  WBC 9.8 7.0  HGB 9.5* 9.4*  HCT 31.8* 30.2*  PLT 379 256  MCV 94.9 95.6  MCH 28.4 29.7  MCHC 29.9* 31.1  RDW 14.8 15.0    Chemistries   Recent Labs Lab 07/15/14 0630 07/19/14 0525  NA 146 145  K 3.7 3.4*  CL 105 104  CO2 28 28  GLUCOSE 112* 130*  BUN 20 17  CREATININE 0.87 0.75  CALCIUM 9.5 9.5   ------------------------------------------------------------------------------------------------------------------ CrCl is unknown because both a height and weight (above a minimum accepted value) are required for this  calculation. ------------------------------------------------------------------------------------------------------------------ No results found for this basename: HGBA1C,  in the last 72 hours ------------------------------------------------------------------------------------------------------------------ No results found for this basename: CHOL, HDL, LDLCALC, TRIG, CHOLHDL, LDLDIRECT,  in the last 72 hours ------------------------------------------------------------------------------------------------------------------ No results found for this basename: TSH, T4TOTAL, FREET3, T3FREE, THYROIDAB,  in the last 72 hours ------------------------------------------------------------------------------------------------------------------ No results found for this basename: VITAMINB12, FOLATE, FERRITIN, TIBC, IRON, RETICCTPCT,  in the last 72 hours  Coagulation profile No results found for this basename: INR, PROTIME,  in the last 168 hours  No results found for this basename: DDIMER,  in the last 72 hours  Cardiac Enzymes No results found for this basename: CK, CKMB, TROPONINI, MYOGLOBIN,  in the last 168 hours ------------------------------------------------------------------------------------------------------------------ No components found with this basename: POCBNP,   Micro Results No results found for this or any previous visit (from the past 240 hour(s)).     Assessment & Plan   Acute respiratory failure; continue with ATC Intracerebral and subarachnoid hemorrhage status post VP shunt Encephalopathy slight improvement Protein calorie malnutrition on tube feeding per PEG Malignant hypertension controlled on metoprolol and amlodipine Diabetes mellitus type 2 on levemir & Insulin insulin sliding scale Left-sided pneumonia on meropenem  till 8/10 Deep venous thrombosis status post inferior vena cava placement Generalized weakness complicated by encephalopathy; PT OT for  treatment Hyperlipidemia continue treatment with statin  Plan  Continue same treatment Continue with PT/OT   Code Status: Full  DVT Prophylaxis  heparin   Merton Border M.D on 07/19/2014 at 9:45 AM

## 2014-07-20 NOTE — Progress Notes (Signed)
[facility]                                                                                              Progress note     Patient Demographics  Tony Davis, is a 53 y.o. male  [id number]  [id number]  DOB - 03-26-1961  Admit date - 07/08/2014  Admitting Physician Merton Border, MD  Outpatient Primary MD for the patient is [name], MD  LOS - 12   Chief complaint   Respiratory failure   Subarachnoid hemorrhage         Subjective:   [name] cannot give any history due to encephalopathy  Objective:   Vital signs  Temperature is a 99.4 Heart rate 88 Respiratory rate  20  Blood pressure 142/90 Pulse ox 100%    Exam  more responsive Eyes bulging bilaterally right pupil bigger than left and they're both sluggish to light Supple Neck,No JVD, No cervical lymphadenopathy appriciated. Tracheostomy midline Symmetrical Chest wall movement, Good air movement bilaterally, mild scattered rhonchi RRR,No Gallops,Rubs or new Murmurs, No Parasternal Heave +ve B.Sounds, Abd Soft, Non tender, No organomegaly appriciated, No rebound - guarding or rigidity. No Cyanosis, Clubbing or edema, No new Rash or bruise     I&Os unknown   Data Review   CBC  Recent Labs Lab 07/15/14 0630 07/19/14 0525  WBC 9.8 7.0  HGB 9.5* 9.4*  HCT 31.8* 30.2*  PLT 379 256  MCV 94.9 95.6  MCH 28.4 29.7  MCHC 29.9* 31.1  RDW 14.8 15.0    Chemistries   Recent Labs Lab 07/15/14 0630 07/19/14 0525  NA 146 145  K 3.7 3.4*  CL 105 104  CO2 28 28  GLUCOSE 112* 130*  BUN 20 17  CREATININE 0.87 0.75  CALCIUM 9.5 9.5   ------------------------------------------------------------------------------------------------------------------ CrCl is unknown because both a height and weight (above a minimum accepted value) are required for this  calculation. ------------------------------------------------------------------------------------------------------------------ No results found for this basename: HGBA1C,  in the last 72 hours ------------------------------------------------------------------------------------------------------------------ No results found for this basename: CHOL, HDL, LDLCALC, TRIG, CHOLHDL, LDLDIRECT,  in the last 72 hours ------------------------------------------------------------------------------------------------------------------ No results found for this basename: TSH, T4TOTAL, FREET3, T3FREE, THYROIDAB,  in the last 72 hours ------------------------------------------------------------------------------------------------------------------ No results found for this basename: VITAMINB12, FOLATE, FERRITIN, TIBC, IRON, RETICCTPCT,  in the last 72 hours  Coagulation profile No results found for this basename: INR, PROTIME,  in the last 168 hours  No results found for this basename: DDIMER,  in the last 72 hours  Cardiac Enzymes No results found for this basename: CK, CKMB, TROPONINI, MYOGLOBIN,  in the last 168 hours ------------------------------------------------------------------------------------------------------------------ No components found with this basename: POCBNP,   Micro Results No results found for this or any previous visit (from the past 240 hour(s)).     Assessment & Plan   Acute respiratory failure; continue with ATC. Last chest x-ray was negative except for cardiomegaly Intracerebral and subarachnoid hemorrhage status post VP shunt. Decreased cranial pressure by CT and LP in the referring hospital Encephalopathy slight improvement continue with fish oil and Provigil Protein calorie malnutrition on tube  feeding per PEG Malignant hypertension controlled on metoprolol and amlodipine Diabetes mellitus type 2 on levemir & Insulin insulin sliding scale Left-sided pneumonia off IV  antibiotics Deep venous thrombosis status post inferior vena cava placement Generalized weakness complicated by encephalopathy; PT OT for treatment Hyperlipidemia continue treatment with statin  Plan  Continue same treatment Continue with PT/OT   Code Status: Full  DVT Prophylaxis  heparin   Merton Border M.D on 07/20/2014 at 12:25 PM

## 2014-07-21 LAB — PREALBUMIN: PREALBUMIN: 24.4 mg/dL (ref 17.0–34.0)

## 2014-07-21 NOTE — Progress Notes (Signed)
[facility]                                                                                              Progress note     Patient Demographics  Tony Davis, is a 52 y.o. male  [id number]  [id number]  DOB - 1961-02-10  Admit date - 07/08/2014  Admitting Physician Merton Border, MD  Outpatient Primary MD for the patient is [name], MD  LOS - 14   Chief complaint   Respiratory failure   Subarachnoid hemorrhage         Subjective:   Tony Davis cannot give any history due to encephalopathy  Objective:   Vital signs  Temperature is a 98.2 Heart rate 88 Respiratory rate  18  Blood pressure 162/88 Pulse ox 99%    Exam  more responsive Eyes bulging bilaterally right pupil bigger than left and they're both sluggish to light Supple Neck,No JVD, No cervical lymphadenopathy appriciated. Tracheostomy midline Symmetrical Chest wall movement, Good air movement bilaterally, mild scattered rhonchi RRR,No Gallops,Rubs or new Murmurs, No Parasternal Heave +ve B.Sounds, Abd Soft, Non tender, No organomegaly appriciated, No rebound - guarding or rigidity. No Cyanosis, Clubbing or edema, No new Rash or bruise     I&Os unknown   Data Review   CBC  Recent Labs Lab 07/15/14 0630 07/19/14 0525  WBC 9.8 7.0  HGB 9.5* 9.4*  HCT 31.8* 30.2*  PLT 379 256  MCV 94.9 95.6  MCH 28.4 29.7  MCHC 29.9* 31.1  RDW 14.8 15.0    Chemistries   Recent Labs Lab 07/15/14 0630 07/19/14 0525  NA 146 145  K 3.7 3.4*  CL 105 104  CO2 28 28  GLUCOSE 112* 130*  BUN 20 17  CREATININE 0.87 0.75  CALCIUM 9.5 9.5   ------------------------------------------------------------------------------------------------------------------ CrCl is unknown because both a height and weight (above a minimum accepted value) are required for this  calculation. ------------------------------------------------------------------------------------------------------------------ No results found for this basename: HGBA1C,  in the last 72 hours ------------------------------------------------------------------------------------------------------------------ No results found for this basename: CHOL, HDL, LDLCALC, TRIG, CHOLHDL, LDLDIRECT,  in the last 72 hours ------------------------------------------------------------------------------------------------------------------ No results found for this basename: TSH, T4TOTAL, FREET3, T3FREE, THYROIDAB,  in the last 72 hours ------------------------------------------------------------------------------------------------------------------ No results found for this basename: VITAMINB12, FOLATE, FERRITIN, TIBC, IRON, RETICCTPCT,  in the last 72 hours  Coagulation profile No results found for this basename: INR, PROTIME,  in the last 168 hours  No results found for this basename: DDIMER,  in the last 72 hours  Cardiac Enzymes No results found for this basename: CK, CKMB, TROPONINI, MYOGLOBIN,  in the last 168 hours ------------------------------------------------------------------------------------------------------------------ No components found with this basename: POCBNP,   Micro Results No results found for this or any previous visit (from the past 240 hour(s)).     Assessment & Plan   Acute respiratory failure; continue with ATC 28%. Last chest x-ray was negative except for cardiomegaly Intracerebral and subarachnoid hemorrhage status post VP shunt. Decreased cranial pressure by CT and LP in the referring hospital Encephalopathy slight improvement continue with fish oil and Provigil Protein calorie malnutrition on  tube feeding per PEG Malignant hypertension controlled on metoprolol and amlodipine Diabetes mellitus type 2 on levemir & Insulin insulin sliding scale Left-sided pneumonia off  IV antibiotics Deep venous thrombosis status post inferior vena cava placement Generalized weakness complicated by encephalopathy; PT OT for treatment Hyperlipidemia continue treatment with statin  Plan  Continue same treatment Continue with PT/OT   Code Status: Full  DVT Prophylaxis  heparin   Merton Border M.D on 07/21/2014 at 11:06 AM

## 2014-07-24 ENCOUNTER — Other Ambulatory Visit (HOSPITAL_COMMUNITY): Payer: Self-pay

## 2014-07-24 DIAGNOSIS — R0989 Other specified symptoms and signs involving the circulatory and respiratory systems: Secondary | ICD-10-CM | POA: Diagnosis not present

## 2014-07-24 LAB — URINE MICROSCOPIC-ADD ON

## 2014-07-24 LAB — BASIC METABOLIC PANEL
ANION GAP: 14 (ref 5–15)
BUN: 11 mg/dL (ref 6–23)
CHLORIDE: 101 meq/L (ref 96–112)
CO2: 27 mEq/L (ref 19–32)
Calcium: 9.9 mg/dL (ref 8.4–10.5)
Creatinine, Ser: 0.75 mg/dL (ref 0.50–1.35)
Glucose, Bld: 109 mg/dL — ABNORMAL HIGH (ref 70–99)
POTASSIUM: 3.6 meq/L — AB (ref 3.7–5.3)
SODIUM: 142 meq/L (ref 137–147)

## 2014-07-24 LAB — URINALYSIS, ROUTINE W REFLEX MICROSCOPIC
Bilirubin Urine: NEGATIVE
Glucose, UA: NEGATIVE mg/dL
Ketones, ur: NEGATIVE mg/dL
NITRITE: NEGATIVE
Protein, ur: NEGATIVE mg/dL
SPECIFIC GRAVITY, URINE: 1.016 (ref 1.005–1.030)
Urobilinogen, UA: 0.2 mg/dL (ref 0.0–1.0)
pH: 7 (ref 5.0–8.0)

## 2014-07-25 LAB — CBC WITH DIFFERENTIAL/PLATELET
BASOS PCT: 0 % (ref 0–1)
Basophils Absolute: 0 10*3/uL (ref 0.0–0.1)
EOS PCT: 5 % (ref 0–5)
Eosinophils Absolute: 0.4 10*3/uL (ref 0.0–0.7)
HEMATOCRIT: 32.6 % — AB (ref 39.0–52.0)
HEMOGLOBIN: 10.2 g/dL — AB (ref 13.0–17.0)
Lymphocytes Relative: 17 % (ref 12–46)
Lymphs Abs: 1.4 10*3/uL (ref 0.7–4.0)
MCH: 29.5 pg (ref 26.0–34.0)
MCHC: 31.3 g/dL (ref 30.0–36.0)
MCV: 94.2 fL (ref 78.0–100.0)
MONO ABS: 0.5 10*3/uL (ref 0.1–1.0)
Monocytes Relative: 6 % (ref 3–12)
Neutro Abs: 6.1 10*3/uL (ref 1.7–7.7)
Neutrophils Relative %: 73 % (ref 43–77)
Platelets: ADEQUATE 10*3/uL (ref 150–400)
RBC: 3.46 MIL/uL — ABNORMAL LOW (ref 4.22–5.81)
RDW: 15.3 % (ref 11.5–15.5)
WBC: 8.5 10*3/uL (ref 4.0–10.5)

## 2014-07-25 LAB — CLOSTRIDIUM DIFFICILE BY PCR: CDIFFPCR: NEGATIVE

## 2014-07-26 LAB — URINE CULTURE
Colony Count: NO GROWTH
Culture: NO GROWTH
Special Requests: NORMAL

## 2014-07-26 NOTE — Progress Notes (Signed)
[facility]                                                                                              Progress note     Patient Demographics  Tony Davis, is a 53 y.o. male  [id number]  [id number]  DOB - 12-19-1960  Admit date - 07/08/2014  Admitting Physician Merton Border, MD  Outpatient Primary MD for the patient is Tony Calico, MD  LOS - 65   Chief complaint   Respiratory failure   Subarachnoid hemorrhage         Subjective:   [name] cannot give any history due to encephalopathy  Objective:   Vital signs  Temperature is a 98.7 Heart rate 98 Respiratory rate  20  Blood pressure 135/83 Davis ox 96%    Exam  more responsive Eyes bulging bilaterally right pupil bigger than left and they're both sluggish to light Supple Neck,No JVD, No cervical lymphadenopathy appriciated. Tracheostomy midline Symmetrical Chest wall movement, Good air movement bilaterally, mild scattered rhonchi RRR,No Gallops,Rubs or new Murmurs, No Parasternal Heave +ve B.Sounds, Abd Soft, Non tender, No organomegaly appriciated, No rebound - guarding or rigidity. No Cyanosis, Clubbing or edema, No new Rash or bruise     I&Os unknown   Data Review   CBC  Recent Labs Lab 07/25/14 0921  WBC 8.5  HGB 10.2*  HCT 32.6*  PLT PLATELET CLUMPS NOTED ON SMEAR, COUNT APPEARS ADEQUATE  MCV 94.2  MCH 29.5  MCHC 31.3  RDW 15.3  LYMPHSABS 1.4  MONOABS 0.5  EOSABS 0.4  BASOSABS 0.0    Chemistries   Recent Labs Lab 07/24/14 0550  NA 142  K 3.6*  CL 101  CO2 27  GLUCOSE 109*  BUN 11  CREATININE 0.75  CALCIUM 9.9   ------------------------------------------------------------------------------------------------------------------ CrCl is unknown because both a height and weight (above a minimum accepted value) are required for this  calculation. ------------------------------------------------------------------------------------------------------------------ No results found for this basename: HGBA1C,  in the last 72 hours ------------------------------------------------------------------------------------------------------------------ No results found for this basename: CHOL, HDL, LDLCALC, TRIG, CHOLHDL, LDLDIRECT,  in the last 72 hours ------------------------------------------------------------------------------------------------------------------ No results found for this basename: TSH, T4TOTAL, FREET3, T3FREE, THYROIDAB,  in the last 72 hours ------------------------------------------------------------------------------------------------------------------ No results found for this basename: VITAMINB12, FOLATE, FERRITIN, TIBC, IRON, RETICCTPCT,  in the last 72 hours  Coagulation profile No results found for this basename: INR, PROTIME,  in the last 168 hours  No results found for this basename: DDIMER,  in the last 72 hours  Cardiac Enzymes No results found for this basename: CK, CKMB, TROPONINI, MYOGLOBIN,  in the last 168 hours ------------------------------------------------------------------------------------------------------------------ No components found with this basename: POCBNP,   Micro Results Recent Results (from the past 240 hour(s))  URINE CULTURE     Status: None   Collection Time    07/24/14  6:05 PM      Result Value Ref Range Status   Specimen Description URINE, RANDOM   Final   Special Requests Normal   Final   Culture  Setup Time     Final   Value: 07/25/2014 03:28  Performed at [facility] Count     Final   Value: NO GROWTH     Performed at [facility]   Culture     Final   Value: NO GROWTH     Performed at [facility]   Report Status 07/26/2014 FINAL   Final  CLOSTRIDIUM DIFFICILE BY PCR     Status: None   Collection Time    07/25/14  4:05  PM      Result Value Ref Range Status   C difficile by pcr NEGATIVE  NEGATIVE Final  CULTURE, RESPIRATORY (NON-EXPECTORATED)     Status: None   Collection Time    07/25/14  5:05 PM      Result Value Ref Range Status   Specimen Description TRACHEAL ASPIRATE   Final   Special Requests NONE   Final   Gram Stain PENDING   Incomplete   Culture     Final   Value: Culture reincubated for better growth     Performed at [facility]   Report Status PENDING   Incomplete       Assessment & Plan   Acute respiratory failure; continue with ATC 28%. Last chest x-ray was negative except for cardiomegaly Intracerebral and subarachnoid hemorrhage status post VP shunt. Decreased cranial pressure by CT and LP in the referring hospital Encephalopathy slight improvement continue with fish oil and Provigil Protein calorie malnutrition on tube feeding per PEG Malignant hypertension controlled on metoprolol and amlodipine Diabetes mellitus type 2 on levemir & Insulin insulin sliding scale Left-sided pneumonia off IV antibiotics Deep venous thrombosis status post inferior vena cava placement Generalized weakness complicated by encephalopathy; PT OT for treatment Hyperlipidemia continue treatment with statin  Plan  Check labs in a.m.  Code Status: Full  DVT Prophylaxis  heparin   Merton Border M.D on 07/26/2014 at 12:46 PM

## 2014-07-27 LAB — CBC
HEMATOCRIT: 31.1 % — AB (ref 39.0–52.0)
HEMOGLOBIN: 9.8 g/dL — AB (ref 13.0–17.0)
MCH: 29.1 pg (ref 26.0–34.0)
MCHC: 31.5 g/dL (ref 30.0–36.0)
MCV: 92.3 fL (ref 78.0–100.0)
Platelets: 242 10*3/uL (ref 150–400)
RBC: 3.37 MIL/uL — AB (ref 4.22–5.81)
RDW: 15.1 % (ref 11.5–15.5)
WBC: 6.9 10*3/uL (ref 4.0–10.5)

## 2014-07-27 LAB — COMPREHENSIVE METABOLIC PANEL
ALT: 22 U/L (ref 0–53)
ANION GAP: 16 — AB (ref 5–15)
AST: 29 U/L (ref 0–37)
Albumin: 3.2 g/dL — ABNORMAL LOW (ref 3.5–5.2)
Alkaline Phosphatase: 119 U/L — ABNORMAL HIGH (ref 39–117)
BUN: 14 mg/dL (ref 6–23)
CALCIUM: 9.5 mg/dL (ref 8.4–10.5)
CO2: 26 meq/L (ref 19–32)
CREATININE: 0.86 mg/dL (ref 0.50–1.35)
Chloride: 101 mEq/L (ref 96–112)
GLUCOSE: 111 mg/dL — AB (ref 70–99)
Potassium: 4.8 mEq/L (ref 3.7–5.3)
Sodium: 143 mEq/L (ref 137–147)
Total Bilirubin: 0.3 mg/dL (ref 0.3–1.2)
Total Protein: 7.1 g/dL (ref 6.0–8.3)

## 2014-07-27 LAB — IRON AND TIBC
IRON: 37 ug/dL — AB (ref 42–135)
Saturation Ratios: 13 % — ABNORMAL LOW (ref 20–55)
TIBC: 282 ug/dL (ref 215–435)
UIBC: 245 ug/dL (ref 125–400)

## 2014-07-27 LAB — FERRITIN: FERRITIN: 314 ng/mL (ref 22–322)

## 2014-07-27 NOTE — Progress Notes (Signed)
[facility]                                                                                              Progress note     Patient Demographics  Tony Davis, is a 53 y.o. male  [id number]  [id number]  DOB - August 22, 1961  Admit date - 07/08/2014  Admitting Physician Merton Border, MD  Outpatient Primary MD for the patient is [name], MD  LOS - 55   Chief complaint   Respiratory failure   Subarachnoid hemorrhage         Subjective:   Tony Davis cannot give any history due to encephalopathy  Objective:   Vital signs  Temperature is a 98.7 Heart rate 98 Respiratory rate  20  Blood pressure 135/83 Pulse ox 96%    Exam  more responsive Eyes bulging bilaterally right pupil bigger than left and they're both sluggish to light Supple Neck,No JVD, No cervical lymphadenopathy appriciated. Tracheostomy midline Symmetrical Chest wall movement, Good air movement bilaterally, mild scattered rhonchi RRR,No Gallops,Rubs or new Murmurs, No Parasternal Heave +ve B.Sounds, Abd Soft, Non tender, No organomegaly appriciated, No rebound - guarding or rigidity. No Cyanosis, Clubbing or edema, No new Rash or bruise     I&Os unknown   Data Review   CBC  Recent Labs Lab 07/25/14 0921 07/27/14 0709  WBC 8.5 6.9  HGB 10.2* 9.8*  HCT 32.6* 31.1*  PLT PLATELET CLUMPS NOTED ON SMEAR, COUNT APPEARS ADEQUATE 242  MCV 94.2 92.3  MCH 29.5 29.1  MCHC 31.3 31.5  RDW 15.3 15.1  LYMPHSABS 1.4  --   MONOABS 0.5  --   EOSABS 0.4  --   BASOSABS 0.0  --     Chemistries   Recent Labs Lab 07/24/14 0550 07/27/14 0709  NA 142 143  K 3.6* 4.8  CL 101 101  CO2 27 26  GLUCOSE 109* 111*  BUN 11 14  CREATININE 0.75 0.86  CALCIUM 9.9 9.5  AST  --  29  ALT  --  22  ALKPHOS  --  119*  BILITOT  --  0.3    ------------------------------------------------------------------------------------------------------------------ CrCl is unknown because both a height and weight (above a minimum accepted value) are required for this calculation. ------------------------------------------------------------------------------------------------------------------ No results found for this basename: HGBA1C,  in the last 72 hours ------------------------------------------------------------------------------------------------------------------ No results found for this basename: CHOL, HDL, LDLCALC, TRIG, CHOLHDL, LDLDIRECT,  in the last 72 hours ------------------------------------------------------------------------------------------------------------------ No results found for this basename: TSH, T4TOTAL, FREET3, T3FREE, THYROIDAB,  in the last 72 hours ------------------------------------------------------------------------------------------------------------------ No results found for this basename: VITAMINB12, FOLATE, FERRITIN, TIBC, IRON, RETICCTPCT,  in the last 72 hours  Coagulation profile No results found for this basename: INR, PROTIME,  in the last 168 hours  No results found for this basename: DDIMER,  in the last 72 hours  Cardiac Enzymes No results found for this basename: CK, CKMB, TROPONINI, MYOGLOBIN,  in the last 168 hours ------------------------------------------------------------------------------------------------------------------ No components found with this basename: POCBNP,   Micro Results Recent Results (from the past 240 hour(s))  URINE CULTURE     Status:  None   Collection Time    07/24/14  6:05 PM      Result Value Ref Range Status   Specimen Description URINE, RANDOM   Final   Special Requests Normal   Final   Culture  Setup Time     Final   Value: 07/25/2014 03:28     Performed at [facility]     Final   Value: NO GROWTH     Performed at  [facility]   Culture     Final   Value: NO GROWTH     Performed at [facility]   Report Status 07/26/2014 FINAL   Final  CLOSTRIDIUM DIFFICILE BY PCR     Status: None   Collection Time    07/25/14  4:05 PM      Result Value Ref Range Status   C difficile by pcr NEGATIVE  NEGATIVE Final  CULTURE, BLOOD (ROUTINE X 2)     Status: None   Collection Time    07/25/14  4:25 PM      Result Value Ref Range Status   Specimen Description BLOOD LEFT ARM   Final   Special Requests BOTTLES DRAWN AEROBIC AND ANAEROBIC 10CC   Final   Culture  Setup Time     Final   Value: 07/26/2014 00:56     Performed at [facility]   Culture     Final   Value:        BLOOD CULTURE RECEIVED NO GROWTH TO DATE CULTURE WILL BE HELD FOR 5 DAYS BEFORE ISSUING A FINAL NEGATIVE REPORT     Performed at [facility]   Report Status PENDING   Incomplete  CULTURE, BLOOD (ROUTINE X 2)     Status: None   Collection Time    07/25/14  4:30 PM      Result Value Ref Range Status   Specimen Description BLOOD LEFT ARM   Final   Special Requests BOTTLES DRAWN AEROBIC AND ANAEROBIC 10CC   Final   Culture  Setup Time     Final   Value: 07/26/2014 00:56     Performed at [facility]   Culture     Final   Value:        BLOOD CULTURE RECEIVED NO GROWTH TO DATE CULTURE WILL BE HELD FOR 5 DAYS BEFORE ISSUING A FINAL NEGATIVE REPORT     Performed at [facility]   Report Status PENDING   Incomplete  CULTURE, RESPIRATORY (NON-EXPECTORATED)     Status: None   Collection Time    07/25/14  5:05 PM      Result Value Ref Range Status   Specimen Description TRACHEAL ASPIRATE   Final   Special Requests NONE   Final   Gram Stain     Final   Value: NO WBC SEEN     NO SQUAMOUS EPITHELIAL CELLS SEEN     RARE GRAM POSITIVE COCCI     IN PAIRS     Performed at [facility]   Culture     Final   Value: MODERATE GRAM NEGATIVE RODS     Performed at [facility]   Report  Status PENDING   Incomplete       Assessment & Plan   Acute respiratory failure; continue with ATC 28%. Last chest x-ray was negative except for cardiomegaly Intracerebral and subarachnoid hemorrhage status post VP shunt. Decreased cranial pressure by CT  and LP in the referring hospital Encephalopathy slight improvement continue with fish oil and Provigil Protein calorie malnutrition on tube feeding per PEG Malignant hypertension controlled on metoprolol and amlodipine Diabetes mellitus type 2 on levemir & Insulin insulin sliding scale Left-sided pneumonia off IV antibiotics Deep venous thrombosis status post inferior vena cava placement Generalized weakness complicated by encephalopathy; PT OT for treatment Hyperlipidemia continue treatment with statin Anemia  Plan  Start iron Check iron studies Critical care time 33 minutes Code Status: Full  DVT Prophylaxis  heparin   Merton Border M.D on 07/27/2014 at 12:23 PM

## 2014-07-28 ENCOUNTER — Other Ambulatory Visit (HOSPITAL_COMMUNITY): Payer: Medicare Other

## 2014-07-28 LAB — CULTURE, RESPIRATORY W GRAM STAIN

## 2014-07-28 LAB — CULTURE, RESPIRATORY: GRAM STAIN: NONE SEEN

## 2014-07-28 NOTE — Progress Notes (Signed)
[name]                                                                                              Progress note     Patient Demographics  Tony Davis, is a 53 y.o. male  [id number]  [id number]  DOB - Apr 16, 1961  Admit date - 07/08/2014  Admitting Physician Merton Border, MD  Outpatient Primary MD for the patient is [name], MD  LOS - 74   Chief complaint   Respiratory failure   Subarachnoid hemorrhage         Subjective:   Tony Davis cannot give any history due to encephalopathy  Objective:   Vital signs  Temperature is a 98.8 Heart rate 98 Respiratory rate  20  Blood pressure 1 03/10/1989 Pulse ox 98%    Exam  more responsive Eyes bulging bilaterally right pupil bigger than left and they're both sluggish to light Supple Neck,No JVD, No cervical lymphadenopathy appriciated. Tracheostomy midline Symmetrical Chest wall movement, Good air movement bilaterally, mild scattered rhonchi RRR,No Gallops,Rubs or new Murmurs, No Parasternal Heave +ve B.Sounds, Abd Soft, Non tender, No organomegaly appriciated, No rebound - guarding or rigidity. No Cyanosis, Clubbing or edema, No new Rash or bruise     I&Os unknown   Data Review   CBC  Recent Labs Lab 07/25/14 0921 07/27/14 0709  WBC 8.5 6.9  HGB 10.2* 9.8*  HCT 32.6* 31.1*  PLT PLATELET CLUMPS NOTED ON SMEAR, COUNT APPEARS ADEQUATE 242  MCV 94.2 92.3  MCH 29.5 29.1  MCHC 31.3 31.5  RDW 15.3 15.1  LYMPHSABS 1.4  --   MONOABS 0.5  --   EOSABS 0.4  --   BASOSABS 0.0  --     Chemistries   Recent Labs Lab 07/24/14 0550 07/27/14 0709  NA 142 143  K 3.6* 4.8  CL 101 101  CO2 27 26  GLUCOSE 109* 111*  BUN 11 14  CREATININE 0.75 0.86  CALCIUM 9.9 9.5  AST  --  29  ALT  --  22  ALKPHOS  --  119*  BILITOT  --  0.3    ------------------------------------------------------------------------------------------------------------------ CrCl is unknown because both a height and weight (above a minimum accepted value) are required for this calculation. ------------------------------------------------------------------------------------------------------------------ No results found for this basename: HGBA1C,  in the last 72 hours ------------------------------------------------------------------------------------------------------------------ No results found for this basename: CHOL, HDL, LDLCALC, TRIG, CHOLHDL, LDLDIRECT,  in the last 72 hours ------------------------------------------------------------------------------------------------------------------ No results found for this basename: TSH, T4TOTAL, FREET3, T3FREE, THYROIDAB,  in the last 72 hours ------------------------------------------------------------------------------------------------------------------  Recent Labs  07/27/14 1350  FERRITIN 314  TIBC 282  IRON 37*    Coagulation profile No results found for this basename: INR, PROTIME,  in the last 168 hours  No results found for this basename: DDIMER,  in the last 72 hours  Cardiac Enzymes No results found for this basename: CK, CKMB, TROPONINI, MYOGLOBIN,  in the last 168 hours ------------------------------------------------------------------------------------------------------------------ No components found with this basename: POCBNP,   Micro Results Recent Results (from the past 240 hour(s))  URINE CULTURE     Status:  None   Collection Time    07/24/14  6:05 PM      Result Value Ref Range Status   Specimen Description URINE, RANDOM   Final   Special Requests Normal   Final   Culture  Setup Time     Final   Value: 07/25/2014 03:28     Performed at [facility]     Final   Value: NO GROWTH     Performed at [facility]   Culture     Final    Value: NO GROWTH     Performed at [facility]   Report Status 07/26/2014 FINAL   Final  CLOSTRIDIUM DIFFICILE BY PCR     Status: None   Collection Time    07/25/14  4:05 PM      Result Value Ref Range Status   C difficile by pcr NEGATIVE  NEGATIVE Final  CULTURE, BLOOD (ROUTINE X 2)     Status: None   Collection Time    07/25/14  4:25 PM      Result Value Ref Range Status   Specimen Description BLOOD LEFT ARM   Final   Special Requests BOTTLES DRAWN AEROBIC AND ANAEROBIC 10CC   Final   Culture  Setup Time     Final   Value: 07/26/2014 00:56     Performed at [facility]   Culture     Final   Value:        BLOOD CULTURE RECEIVED NO GROWTH TO DATE CULTURE WILL BE HELD FOR 5 DAYS BEFORE ISSUING A FINAL NEGATIVE REPORT     Performed at [facility]   Report Status PENDING   Incomplete  CULTURE, BLOOD (ROUTINE X 2)     Status: None   Collection Time    07/25/14  4:30 PM      Result Value Ref Range Status   Specimen Description BLOOD LEFT ARM   Final   Special Requests BOTTLES DRAWN AEROBIC AND ANAEROBIC 10CC   Final   Culture  Setup Time     Final   Value: 07/26/2014 00:56     Performed at [facility]   Culture     Final   Value:        BLOOD CULTURE RECEIVED NO GROWTH TO DATE CULTURE WILL BE HELD FOR 5 DAYS BEFORE ISSUING A FINAL NEGATIVE REPORT     Performed at [facility]   Report Status PENDING   Incomplete  CULTURE, RESPIRATORY (NON-EXPECTORATED)     Status: None   Collection Time    07/25/14  5:05 PM      Result Value Ref Range Status   Specimen Description TRACHEAL ASPIRATE   Final   Special Requests NONE   Final   Gram Stain     Final   Value: NO WBC SEEN     NO SQUAMOUS EPITHELIAL CELLS SEEN     RARE GRAM POSITIVE COCCI     IN PAIRS     Performed at [facility]   Culture     Final   Value: MODERATE ENTEROBACTER CLOACAE     Performed at [facility]   Report Status 07/28/2014 FINAL   Final   Organism ID,  Bacteria ENTEROBACTER CLOACAE   Final       Assessment & Plan   Acute respiratory failure; continue with ATC 28%. Last chest x-ray was negative except for cardiomegaly Intracerebral and subarachnoid  hemorrhage status post VP shunt. Decreased cranial pressure by CT and LP in the referring hospital Encephalopathy slight improvement continue with fish oil and Provigil Protein calorie malnutrition on tube feeding per PEG Malignant hypertension controlled on metoprolol and amlodipine Diabetes mellitus type 2 on levemir & Insulin insulin sliding scale Left-sided pneumonia off IV antibiotics Deep venous thrombosis status post inferior vena cava placement Generalized weakness complicated by encephalopathy; PT OT for treatment Hyperlipidemia continue treatment with statin Anemia continue with iron Enterobacter tracheobronchitis sensitive to Cipro  Plan  Start Cipro per pharmacy twice a day  Code Status: Full  DVT Prophylaxis  heparin   Merton Border M.D on 07/28/2014 at 5:26 PM

## 2014-07-29 ENCOUNTER — Encounter: Payer: Self-pay | Admitting: *Deleted

## 2014-07-29 NOTE — Progress Notes (Signed)
[facility]                                                                                              Progress note     Patient Demographics  Tony Davis, is a 53 y.o. male  [id number]  [id number]  DOB - 28-Sep-1961  Admit date - 07/08/2014  Admitting Physician Merton Border, MD  Outpatient Primary MD for the patient is [name], MD  LOS - 21   Chief complaint   Respiratory failure   Subarachnoid hemorrhage         Subjective:   [name] cannot give any history due to encephalopathy  Objective:   Vital signs  Temperature is a 98 Heart rate 90 Respiratory rate  20  Blood pressure 147/97 Pulse ox 97%    Exam  more responsive Eyes bulging bilaterally right pupil bigger than left and they're both sluggish to light Supple Neck,No JVD, No cervical lymphadenopathy appriciated. Tracheostomy midline Symmetrical Chest wall movement, Good air movement bilaterally, mild scattered rhonchi RRR,No Gallops,Rubs or new Murmurs, No Parasternal Heave +ve B.Sounds, Abd Soft, Non tender, No organomegaly appriciated, No rebound - guarding or rigidity. No Cyanosis, Clubbing or edema, No new Rash or bruise     I&Os unknown   Data Review   CBC  Recent Labs Lab 07/25/14 0921 07/27/14 0709  WBC 8.5 6.9  HGB 10.2* 9.8*  HCT 32.6* 31.1*  PLT PLATELET CLUMPS NOTED ON SMEAR, COUNT APPEARS ADEQUATE 242  MCV 94.2 92.3  MCH 29.5 29.1  MCHC 31.3 31.5  RDW 15.3 15.1  LYMPHSABS 1.4  --   MONOABS 0.5  --   EOSABS 0.4  --   BASOSABS 0.0  --     Chemistries   Recent Labs Lab 07/24/14 0550 07/27/14 0709  NA 142 143  K 3.6* 4.8  CL 101 101  CO2 27 26  GLUCOSE 109* 111*  BUN 11 14  CREATININE 0.75 0.86  CALCIUM 9.9 9.5  AST  --  29  ALT  --  22  ALKPHOS  --  119*  BILITOT  --  0.3    ------------------------------------------------------------------------------------------------------------------ CrCl is unknown because both a height and weight (above a minimum accepted value) are required for this calculation. ------------------------------------------------------------------------------------------------------------------ No results found for this basename: HGBA1C,  in the last 72 hours ------------------------------------------------------------------------------------------------------------------ No results found for this basename: CHOL, HDL, LDLCALC, TRIG, CHOLHDL, LDLDIRECT,  in the last 72 hours ------------------------------------------------------------------------------------------------------------------ No results found for this basename: TSH, T4TOTAL, FREET3, T3FREE, THYROIDAB,  in the last 72 hours ------------------------------------------------------------------------------------------------------------------  Recent Labs  07/27/14 1350  FERRITIN 314  TIBC 282  IRON 37*    Coagulation profile No results found for this basename: INR, PROTIME,  in the last 168 hours  No results found for this basename: DDIMER,  in the last 72 hours  Cardiac Enzymes No results found for this basename: CK, CKMB, TROPONINI, MYOGLOBIN,  in the last 168 hours ------------------------------------------------------------------------------------------------------------------ No components found with this basename: POCBNP,   Micro Results Recent Results (from the past 240 hour(s))  URINE CULTURE     Status: None  Collection Time    07/24/14  6:05 PM      Result Value Ref Range Status   Specimen Description URINE, RANDOM   Final   Special Requests Normal   Final   Culture  Setup Time     Final   Value: 07/25/2014 03:28     Performed at [facility]     Final   Value: NO GROWTH     Performed at [facility]   Culture     Final    Value: NO GROWTH     Performed at [facility]   Report Status 07/26/2014 FINAL   Final  CLOSTRIDIUM DIFFICILE BY PCR     Status: None   Collection Time    07/25/14  4:05 PM      Result Value Ref Range Status   C difficile by pcr NEGATIVE  NEGATIVE Final  CULTURE, BLOOD (ROUTINE X 2)     Status: None   Collection Time    07/25/14  4:25 PM      Result Value Ref Range Status   Specimen Description BLOOD LEFT ARM   Final   Special Requests BOTTLES DRAWN AEROBIC AND ANAEROBIC 10CC   Final   Culture  Setup Time     Final   Value: 07/26/2014 00:56     Performed at [facility]   Culture     Final   Value:        BLOOD CULTURE RECEIVED NO GROWTH TO DATE CULTURE WILL BE HELD FOR 5 DAYS BEFORE ISSUING A FINAL NEGATIVE REPORT     Performed at [facility]   Report Status PENDING   Incomplete  CULTURE, BLOOD (ROUTINE X 2)     Status: None   Collection Time    07/25/14  4:30 PM      Result Value Ref Range Status   Specimen Description BLOOD LEFT ARM   Final   Special Requests BOTTLES DRAWN AEROBIC AND ANAEROBIC 10CC   Final   Culture  Setup Time     Final   Value: 07/26/2014 00:56     Performed at [facility]   Culture     Final   Value:        BLOOD CULTURE RECEIVED NO GROWTH TO DATE CULTURE WILL BE HELD FOR 5 DAYS BEFORE ISSUING A FINAL NEGATIVE REPORT     Performed at [facility]   Report Status PENDING   Incomplete  CULTURE, RESPIRATORY (NON-EXPECTORATED)     Status: None   Collection Time    07/25/14  5:05 PM      Result Value Ref Range Status   Specimen Description TRACHEAL ASPIRATE   Final   Special Requests NONE   Final   Gram Stain     Final   Value: NO WBC SEEN     NO SQUAMOUS EPITHELIAL CELLS SEEN     RARE GRAM POSITIVE COCCI     IN PAIRS     Performed at [facility]   Culture     Final   Value: MODERATE ENTEROBACTER CLOACAE     Performed at [facility]   Report Status 07/28/2014 FINAL   Final   Organism ID,  Bacteria ENTEROBACTER CLOACAE   Final       Assessment & Plan   Acute respiratory failure; continue with ATC 28%. Last chest x-ray was negative except for cardiomegaly Intracerebral and subarachnoid hemorrhage status post  VP shunt. Decreased cranial pressure by CT and LP in the referring hospital Encephalopathy slight improvement continue with fish oil and Provigil Protein calorie malnutrition on tube feeding per PEG Malignant hypertension controlled on metoprolol and amlodipine Diabetes mellitus type 2 on levemir & Insulin insulin sliding scale Left-sided pneumonia off IV antibiotics Deep venous thrombosis status post inferior vena cava placement Generalized weakness complicated by encephalopathy; PT OT for treatment Hyperlipidemia continue treatment with statin Anemia continue with iron Enterobacter tracheobronchitis sensitive to Cipro  Plan  Continue same treatment Awaiting placement  Code Status: Full  DVT Prophylaxis  heparin   Merton Border M.D on 07/29/2014 at 12:21 PM

## 2014-07-30 ENCOUNTER — Inpatient Hospital Stay (HOSPITAL_COMMUNITY)
Admission: RE | Admit: 2014-07-30 | Discharge: 2014-09-07 | DRG: 945 | Disposition: A | Discharge status: Skilled Nursing Facility | Payer: Medicare Other | Source: Other Acute Inpatient Hospital | Attending: Physical Medicine & Rehabilitation | Admitting: Physical Medicine & Rehabilitation

## 2014-07-30 DIAGNOSIS — R471 Dysarthria and anarthria: Secondary | ICD-10-CM | POA: Diagnosis present

## 2014-07-30 DIAGNOSIS — Z86718 Personal history of other venous thrombosis and embolism: Secondary | ICD-10-CM | POA: Diagnosis not present

## 2014-07-30 DIAGNOSIS — Z982 Presence of cerebrospinal fluid drainage device: Secondary | ICD-10-CM

## 2014-07-30 DIAGNOSIS — M069 Rheumatoid arthritis, unspecified: Secondary | ICD-10-CM | POA: Diagnosis present

## 2014-07-30 DIAGNOSIS — M79609 Pain in unspecified limb: Secondary | ICD-10-CM | POA: Diagnosis not present

## 2014-07-30 DIAGNOSIS — Z930 Tracheostomy status: Secondary | ICD-10-CM | POA: Diagnosis not present

## 2014-07-30 DIAGNOSIS — Z5189 Encounter for other specified aftercare: Secondary | ICD-10-CM | POA: Diagnosis not present

## 2014-07-30 DIAGNOSIS — Z888 Allergy status to other drugs, medicaments and biological substances status: Secondary | ICD-10-CM

## 2014-07-30 DIAGNOSIS — D649 Anemia, unspecified: Secondary | ICD-10-CM | POA: Diagnosis not present

## 2014-07-30 DIAGNOSIS — M1712 Unilateral primary osteoarthritis, left knee: Secondary | ICD-10-CM | POA: Diagnosis present

## 2014-07-30 DIAGNOSIS — D62 Acute posthemorrhagic anemia: Secondary | ICD-10-CM | POA: Diagnosis present

## 2014-07-30 DIAGNOSIS — K219 Gastro-esophageal reflux disease without esophagitis: Secondary | ICD-10-CM | POA: Diagnosis present

## 2014-07-30 DIAGNOSIS — M1710 Unilateral primary osteoarthritis, unspecified knee: Secondary | ICD-10-CM | POA: Diagnosis present

## 2014-07-30 DIAGNOSIS — I82409 Acute embolism and thrombosis of unspecified deep veins of unspecified lower extremity: Secondary | ICD-10-CM | POA: Diagnosis present

## 2014-07-30 DIAGNOSIS — Z794 Long term (current) use of insulin: Secondary | ICD-10-CM

## 2014-07-30 DIAGNOSIS — I2510 Atherosclerotic heart disease of native coronary artery without angina pectoris: Secondary | ICD-10-CM

## 2014-07-30 DIAGNOSIS — G8190 Hemiplegia, unspecified affecting unspecified side: Secondary | ICD-10-CM | POA: Diagnosis present

## 2014-07-30 DIAGNOSIS — Z8673 Personal history of transient ischemic attack (TIA), and cerebral infarction without residual deficits: Secondary | ICD-10-CM

## 2014-07-30 DIAGNOSIS — R7303 Prediabetes: Secondary | ICD-10-CM | POA: Diagnosis present

## 2014-07-30 DIAGNOSIS — E785 Hyperlipidemia, unspecified: Secondary | ICD-10-CM | POA: Diagnosis present

## 2014-07-30 DIAGNOSIS — G8929 Other chronic pain: Secondary | ICD-10-CM | POA: Diagnosis present

## 2014-07-30 DIAGNOSIS — I610 Nontraumatic intracerebral hemorrhage in hemisphere, subcortical: Secondary | ICD-10-CM | POA: Diagnosis present

## 2014-07-30 DIAGNOSIS — Z87891 Personal history of nicotine dependence: Secondary | ICD-10-CM

## 2014-07-30 DIAGNOSIS — R4701 Aphasia: Secondary | ICD-10-CM | POA: Diagnosis present

## 2014-07-30 DIAGNOSIS — B9689 Other specified bacterial agents as the cause of diseases classified elsewhere: Secondary | ICD-10-CM | POA: Diagnosis present

## 2014-07-30 DIAGNOSIS — Z9861 Coronary angioplasty status: Secondary | ICD-10-CM | POA: Diagnosis not present

## 2014-07-30 DIAGNOSIS — N401 Enlarged prostate with lower urinary tract symptoms: Secondary | ICD-10-CM | POA: Diagnosis not present

## 2014-07-30 DIAGNOSIS — I6350 Cerebral infarction due to unspecified occlusion or stenosis of unspecified cerebral artery: Secondary | ICD-10-CM

## 2014-07-30 DIAGNOSIS — IMO0002 Reserved for concepts with insufficient information to code with codable children: Secondary | ICD-10-CM

## 2014-07-30 DIAGNOSIS — IMO0001 Reserved for inherently not codable concepts without codable children: Secondary | ICD-10-CM

## 2014-07-30 DIAGNOSIS — R1312 Dysphagia, oropharyngeal phase: Secondary | ICD-10-CM | POA: Diagnosis present

## 2014-07-30 DIAGNOSIS — I10 Essential (primary) hypertension: Secondary | ICD-10-CM | POA: Diagnosis present

## 2014-07-30 DIAGNOSIS — R32 Unspecified urinary incontinence: Secondary | ICD-10-CM | POA: Diagnosis present

## 2014-07-30 DIAGNOSIS — Z7901 Long term (current) use of anticoagulants: Secondary | ICD-10-CM | POA: Diagnosis not present

## 2014-07-30 DIAGNOSIS — G911 Obstructive hydrocephalus: Secondary | ICD-10-CM | POA: Diagnosis present

## 2014-07-30 DIAGNOSIS — N400 Enlarged prostate without lower urinary tract symptoms: Secondary | ICD-10-CM | POA: Diagnosis not present

## 2014-07-30 DIAGNOSIS — R159 Full incontinence of feces: Secondary | ICD-10-CM | POA: Diagnosis present

## 2014-07-30 DIAGNOSIS — E876 Hypokalemia: Secondary | ICD-10-CM | POA: Diagnosis present

## 2014-07-30 DIAGNOSIS — N138 Other obstructive and reflux uropathy: Secondary | ICD-10-CM | POA: Diagnosis present

## 2014-07-30 DIAGNOSIS — R351 Nocturia: Secondary | ICD-10-CM | POA: Diagnosis present

## 2014-07-30 DIAGNOSIS — E1165 Type 2 diabetes mellitus with hyperglycemia: Secondary | ICD-10-CM

## 2014-07-30 DIAGNOSIS — I619 Nontraumatic intracerebral hemorrhage, unspecified: Secondary | ICD-10-CM | POA: Diagnosis present

## 2014-07-30 DIAGNOSIS — J9600 Acute respiratory failure, unspecified whether with hypoxia or hypercapnia: Secondary | ICD-10-CM

## 2014-07-30 DIAGNOSIS — Q211 Atrial septal defect: Secondary | ICD-10-CM | POA: Diagnosis not present

## 2014-07-30 DIAGNOSIS — G4733 Obstructive sleep apnea (adult) (pediatric): Secondary | ICD-10-CM | POA: Diagnosis present

## 2014-07-30 DIAGNOSIS — I82403 Acute embolism and thrombosis of unspecified deep veins of lower extremity, bilateral: Secondary | ICD-10-CM

## 2014-07-30 DIAGNOSIS — J40 Bronchitis, not specified as acute or chronic: Secondary | ICD-10-CM | POA: Diagnosis present

## 2014-07-30 DIAGNOSIS — R5381 Other malaise: Secondary | ICD-10-CM | POA: Diagnosis not present

## 2014-07-30 DIAGNOSIS — Q2111 Secundum atrial septal defect: Secondary | ICD-10-CM | POA: Diagnosis not present

## 2014-07-30 DIAGNOSIS — M7989 Other specified soft tissue disorders: Secondary | ICD-10-CM | POA: Diagnosis not present

## 2014-07-30 MED ORDER — SENNOSIDES-DOCUSATE SODIUM 8.6-50 MG PO TABS
2.0000 | ORAL_TABLET | Freq: Every evening | ORAL | Status: DC | PRN
  Administered 2014-09-01: 2 via ORAL
  Filled 2014-07-30: qty 2

## 2014-07-30 MED ORDER — ALUM & MAG HYDROXIDE-SIMETH 200-200-20 MG/5ML PO SUSP: 30.0000 mL | ORAL | Status: DC | PRN

## 2014-07-30 MED ORDER — ATORVASTATIN CALCIUM 20 MG PO TABS
20.0000 mg | ORAL_TABLET | Freq: Every day | ORAL | Status: DC
  Administered 2014-07-31 – 2014-09-06 (×38): 20 mg via ORAL
  Filled 2014-07-30 (×39): qty 1

## 2014-07-30 MED ORDER — ONDANSETRON HCL 4 MG PO TABS: 4.0000 mg | ORAL_TABLET | Freq: Four times a day (QID) | ORAL | Status: DC | PRN

## 2014-07-30 MED ORDER — AMLODIPINE BESYLATE 5 MG PO TABS
5.0000 mg | ORAL_TABLET | Freq: Every day | ORAL | Status: DC
  Administered 2014-07-31 – 2014-09-07 (×39): 5 mg via ORAL
  Filled 2014-07-30 (×41): qty 1

## 2014-07-30 MED ORDER — ENOXAPARIN SODIUM 40 MG/0.4ML ~~LOC~~ SOLN
40.0000 mg | SUBCUTANEOUS | Status: DC
  Administered 2014-07-30 – 2014-09-06 (×38): 40 mg via SUBCUTANEOUS
  Filled 2014-07-30 (×43): qty 0.4

## 2014-07-30 MED ORDER — INSULIN ASPART 100 UNIT/ML ~~LOC~~ SOLN: 0.0000 [IU] | Freq: Every day | SUBCUTANEOUS | Status: DC

## 2014-07-30 MED ORDER — METOPROLOL TARTRATE 50 MG PO TABS
75.0000 mg | ORAL_TABLET | Freq: Two times a day (BID) | ORAL | Status: DC
  Administered 2014-07-30 – 2014-09-07 (×78): 75 mg via ORAL
  Filled 2014-07-30 (×82): qty 1

## 2014-07-30 MED ORDER — INSULIN ASPART 100 UNIT/ML ~~LOC~~ SOLN
0.0000 [IU] | Freq: Three times a day (TID) | SUBCUTANEOUS | Status: DC
  Administered 2014-08-01 – 2014-08-02 (×2): 2 [IU] via SUBCUTANEOUS

## 2014-07-30 MED ORDER — FERROUS GLUCONATE 324 (38 FE) MG PO TABS
324.0000 mg | ORAL_TABLET | Freq: Two times a day (BID) | ORAL | Status: DC
  Administered 2014-07-31 – 2014-08-02 (×5): 324 mg via ORAL
  Filled 2014-07-30 (×6): qty 1

## 2014-07-30 MED ORDER — GUAIFENESIN-DM 100-10 MG/5ML PO SYRP
5.0000 mL | ORAL_SOLUTION | Freq: Four times a day (QID) | ORAL | Status: DC | PRN
  Administered 2014-08-04: 10 mL via ORAL
  Filled 2014-07-30: qty 10

## 2014-07-30 MED ORDER — INSULIN DETEMIR 100 UNIT/ML ~~LOC~~ SOLN
55.0000 [IU] | Freq: Two times a day (BID) | SUBCUTANEOUS | Status: DC
  Administered 2014-07-30 – 2014-08-08 (×18): 55 [IU] via SUBCUTANEOUS
  Filled 2014-07-30 (×20): qty 0.55

## 2014-07-30 MED ORDER — PRO-STAT SUGAR FREE PO LIQD
30.0000 mL | Freq: Three times a day (TID) | ORAL | Status: DC
  Administered 2014-07-31 (×2): 30 mL via ORAL
  Administered 2014-07-31: 10:00:00 via ORAL
  Administered 2014-08-01 – 2014-08-02 (×5): 30 mL via ORAL
  Filled 2014-07-30 (×10): qty 30

## 2014-07-30 MED ORDER — BISACODYL 10 MG RE SUPP
10.0000 mg | Freq: Every day | RECTAL | Status: DC | PRN
  Administered 2014-08-29: 10 mg via RECTAL
  Filled 2014-07-30: qty 1

## 2014-07-30 MED ORDER — LEVOFLOXACIN 750 MG PO TABS
750.0000 mg | ORAL_TABLET | Freq: Every day | ORAL | Status: AC
  Administered 2014-07-31 – 2014-08-04 (×5): 750 mg via ORAL
  Filled 2014-07-30 (×5): qty 1

## 2014-07-30 MED ORDER — SACCHAROMYCES BOULARDII 250 MG PO CAPS
250.0000 mg | ORAL_CAPSULE | Freq: Two times a day (BID) | ORAL | Status: DC
  Administered 2014-07-30 – 2014-09-07 (×78): 250 mg via ORAL
  Filled 2014-07-30 (×82): qty 1

## 2014-07-30 MED ORDER — TRAZODONE HCL 50 MG PO TABS: 25.0000 mg | ORAL_TABLET | Freq: Every evening | ORAL | Status: DC | PRN

## 2014-07-30 MED ORDER — ACETAMINOPHEN 325 MG PO TABS
325.0000 mg | ORAL_TABLET | ORAL | Status: DC | PRN
  Administered 2014-08-02 – 2014-09-01 (×3): 650 mg via ORAL
  Filled 2014-07-30 (×4): qty 2

## 2014-07-30 MED ORDER — ONDANSETRON HCL 4 MG/2ML IJ SOLN: 4.0000 mg | Freq: Four times a day (QID) | INTRAMUSCULAR | Status: DC | PRN

## 2014-07-30 MED ORDER — MODAFINIL 100 MG PO TABS
200.0000 mg | ORAL_TABLET | Freq: Every day | ORAL | Status: DC
  Administered 2014-07-31 – 2014-08-02 (×3): 200 mg via ORAL
  Filled 2014-07-30 (×3): qty 2

## 2014-07-30 MED ORDER — NITROGLYCERIN 0.4 MG SL SUBL: 0.4000 mg | SUBLINGUAL_TABLET | SUBLINGUAL | Status: DC | PRN

## 2014-07-30 MED ORDER — OXYCODONE HCL 5 MG PO TABS: 5.0000 mg | ORAL_TABLET | Freq: Four times a day (QID) | ORAL | Status: DC | PRN

## 2014-07-30 MED ORDER — VITAL 1.5 CAL PO LIQD
1000.0000 mL | ORAL | Status: DC
  Administered 2014-07-31: 1000 mL
  Filled 2014-07-30: qty 1000

## 2014-07-30 MED ORDER — FAMOTIDINE 20 MG PO TABS
20.0000 mg | ORAL_TABLET | Freq: Every day | ORAL | Status: DC
  Administered 2014-07-31 – 2014-09-07 (×40): 20 mg via ORAL
  Filled 2014-07-30 (×43): qty 1

## 2014-07-30 MED ORDER — DICLOFENAC SODIUM 1 % TD GEL
2.0000 g | Freq: Four times a day (QID) | TRANSDERMAL | Status: DC
  Administered 2014-07-30 – 2014-09-07 (×110): 2 g via TOPICAL
  Filled 2014-07-30 (×5): qty 100

## 2014-07-30 MED ORDER — FLEET ENEMA 7-19 GM/118ML RE ENEM: 1.0000 | ENEMA | Freq: Once | RECTAL | Status: AC | PRN

## 2014-07-30 NOTE — H&P (Addendum)
Physical Medicine and Rehabilitation Admission H&P    CC: Right sided weakness, aphasia,   HPI:  [name] is a 53 year old male with h/o DM type 2, CAD, OSA, chronic pain bilateral knees, CVA '11, PFO-on chronic coumadin who was admitted to OSH on 06/13/14 with left basal ganglia hemorrhage with intraventricular extension and hydrocephalus requiring right crani for evacuation as well as VP shunt placement  Hospital course complicated by acute respiratory failure requiring tracheostomy as well as PEG placement, BLE DVT requiring IVC placement as well as fevers due to enterobacter PNA. Patient with subsequent right hemiparesis, lethargy, nonverbal, requiring noxious stimuli for arousal, difficulty tracking, dysconjugate gaze as well as difficulty following commands.  He was transferred to  [facility] on 07/08/14 for vent wean and rehab. He was maintained on meropenum for HCAP and malignant HTN managed with medication changes. Antibiotics completed on 08/10 but patient developed recurrent fever 101 F on 08/16 with purulent drainage from trach. He was pan cultured started on Levaquin for treatment. Stool negative for C diff. UCS without growth. BC X 2 negative so far and pending. Respiratory culture grew moderate enterobacter cloacae with recommendations for 10 day treatment. BLE edema treated with compression wraps.  Trach downsized to CFS # 6 on 08/15 and PMSV is able to tolerate PMSV. On dysphagia 2 diet with thin liquids but poor po intake reported. He continues on supplemental TF at nights and diarrhea resolved.  Therapy ongoing with has had improvement in mentation as well as activity level. CIR recommended by Rehab and MD. He is admitted today for comprehensive inpatient rehab program.     Review of Systems  Unable to perform ROS: mental acuity   Past Medical History  Diagnosis Date  . GERD (gastroesophageal reflux disease)   . HLD (hyperlipidemia)   . HTN (hypertension)   . Hematuria  8/12  . PFO (patent foramen ovale)     echo 8/12: EF 55-65%, grade 2 diast dysfnx; +PFO on bubble study;  TEE 4/13: EF normal, atrial septum with suspicion for interatrial septum fenestrations without flow across and few large bubbles noted in LA.  This was not felt to require closure   . CAD (coronary artery disease)     a. s/p promus DES circumflex artery 10/13/10. b. Cath 04/2013: stable disease but possible small vessel disease -Imdur added.  Marland Kitchen DVT (deep venous thrombosis) 2013    "right"   . TIA (transient ischemic attack)     "1-2; both after the one in 02/2010" (09/25/2013)  . OSA on CPAP   . Type II diabetes mellitus     June 2014 AIC 6.2%/notes 09/24/2013; pt denies this hx on 09/25/2013  . Arthritis     "knees" (09/25/2013)  . Rheumatoid arthritis(714.0)   . CVA (cerebral vascular accident) 02/2010    "lost peripheral vision when I had the stroke; some memory issues since" (09/25/2013)    Past Surgical History  Procedure Laterality Date  . Knee arthroscopy Bilateral 2000's    2 on left and 3 on rt  . Tee without cardioversion  03/21/2012    Procedure: TRANSESOPHAGEAL ECHOCARDIOGRAM (TEE);  Surgeon: Fay Records, MD;  Location: [facility] ENDOSCOPY;  Service: Cardiovascular;  Laterality: N/A;  . Coronary angioplasty with stent placement  2012    "1" (09/24/2013)    Family History  Problem Relation Age of Onset  . Cancer Neg Hx   . Kidney disease Neg Hx   . Diabetes Neg Hx  Social History:  reports that he quit smoking about 2 years ago. His smoking use included Cigarettes. He has a 35 pack-year smoking history. He has never used smokeless tobacco. He reports that he does not drink alcohol or use illicit drugs.   Allergies  Allergen Reactions  . Iodine     REACTION: anaphylaxis  . Amlodipine     Foot edema   Medications Prior to Admission  Medication Sig Dispense Refill  . acetaminophen (TYLENOL) 160 MG/5ML liquid Take 160 mg by mouth every 4 (four) hours as needed for  fever.      Marland Kitchen aspirin EC 81 MG EC tablet Take 1 tablet (81 mg total) by mouth daily.      . carvedilol (COREG) 25 MG tablet Take 1 tablet (25 mg total) by mouth 2 (two) times daily with a meal.  572 tablet  3  . folic acid (FOLVITE) 1 MG tablet Take 1 mg by mouth daily.      . furosemide (LASIX) 20 MG tablet Take 20 mg by mouth daily.      . isosorbide mononitrate (IMDUR) 30 MG 24 hr tablet Take 1 tablet (30 mg total) by mouth 2 (two) times daily.  180 tablet  3  . losartan (COZAAR) 100 MG tablet Take 1 tablet (100 mg total) by mouth daily.  90 tablet  3  . mometasone (NASONEX) 50 MCG/ACT nasal spray Place 2 sprays into both nostrils daily as needed (allergies). To each nostril      . nitroGLYCERIN (NITROSTAT) 0.4 MG SL tablet Place 1 tablet (0.4 mg total) under the tongue every 5 (five) minutes as needed for chest pain (up to 3 doses).  25 tablet  3  . OxyCODONE (OXYCONTIN) 40 mg T12A 12 hr tablet Take 1 tablet (40 mg total) by mouth every 12 (twelve) hours.  60 tablet  0  . oxyCODONE (ROXICODONE) 15 MG immediate release tablet Take 1 tablet (15 mg total) by mouth every 4 (four) hours as needed for pain.  90 tablet  0  . pantoprazole (PROTONIX) 40 MG tablet Take 1 tablet (40 mg total) by mouth 2 (two) times daily before a meal.  60 tablet  5  . potassium chloride SA (KLOR-CON M20) 20 MEQ tablet Take 4 tablets (80 mEq total) by mouth 2 (two) times daily.  224 tablet  11  . warfarin (COUMADIN) 7.5 MG tablet Take as directed by coumadin clinic  30 tablet  3    Home:     Functional History: Independent prior to bleed.   Functional Status:  Mobility: Able to roll in bed with Max assist of 1-2 Able to sit EOB X 20 mins with min assist and tendency to lean to left.  Requires total A +2 with draw sheet to transfer to chair.     ADL: Working on ROM and BLE wraps. Working on self feeding with built up utensils.   Cognition: Able to verbalize 1-2 words.     Physical Exam: There were no  vitals taken for this visit. Physical Exam  Nursing note and vitals reviewed. Constitutional: He appears well-developed and well-nourished.  HENT:  Head: Normocephalic.  Right Ear: External ear normal.  Left Ear: External ear normal.  Right crani incision healing well.   Eyes: Conjunctivae and EOM are normal. Pupils are equal, round, and reactive to light.  Occasionally closes left eye.   Neck: No thyromegaly present.  #6 Trach in place with PMSV and tach collar.   Cardiovascular: Normal  rate and regular rhythm.   No murmur heard. Respiratory: Effort normal. No respiratory distress. He has decreased breath sounds in the right lower field and the left lower field. He has wheezes in the right upper field and the left upper field. He has no rales. He exhibits no tenderness.  GI: Soft. Bowel sounds are normal. He exhibits no distension. There is no tenderness. There is no rebound and no guarding.  PEG site clean and dry without drainage.   Musculoskeletal:  BLE with compression wraps. Edema RLE >LLE with discomfort with ROM. Edema also present in both UE's.  Neurological:  Needed sternal rubs as well as max cues to arouse and participate with exam and was able to maintain arousal for latter part of exam. Pupils dilated.  Able to state name but unable to answer basic Y/N questions--question apraxic component. Delayed processing and was able to follow occasional simple one step command. likely visual deficits with left gaze preference noted. Did attempt to track to all fields on exam.. Right sided weakness noted more than left but decreased ability to consistently. Decreased sensation of PP and LT in all 4's.  participate in exam.  Lacks insight/awareness of deficits.   Skin: Skin is warm and dry.  Psychiatric:  Lethargic, limited     BMET    Component Value Date/Time   NA 143 07/27/2014 0709   K 4.8 07/27/2014 0709   CL 101 07/27/2014 0709   CO2 26 07/27/2014 0709   GLUCOSE 111* 07/27/2014  0709   BUN 14 07/27/2014 0709   CREATININE 0.86 07/27/2014 0709   CALCIUM 9.5 07/27/2014 0709   GFRNONAA >90 07/27/2014 0709   GFRAA >90 07/27/2014 0709   CBC Latest Ref Rng 07/27/2014 07/25/2014 07/19/2014  WBC 4.0 - 10.5 K/uL 6.9 8.5 7.0  Hemoglobin 13.0 - 17.0 g/dL 9.8(L) 10.2(L) 9.4(L)  Hematocrit 39.0 - 52.0 % 31.1(L) 32.6(L) 30.2(L)  Platelets 150 - 400 K/uL 242 PLATELET CLUMPS NOTED ON SMEAR, COUNT APPEARS ADEQUATE 256    07/12/14 Brain CT:  IMPRESSION:  1. Negative for hydrocephalus. Ventricular volume is decreased  compared to preoperative head CT 04/15/2014.  2. Trace subdural effusion or chronic hematoma in the right frontal  parietal region.  3. Intraventricular hemorrhage and probable subacute hematoma in the  left thalamus. No comparison imaging to document stability.   Medical Problem List and Plan: 1. Functional deficits secondary to Left thalamic bleed with hydrocephalus.  2.  DVT Prophylaxis/Anticoagulation: Pharmaceutical: Lovenox--is now 6 weeks out and should be able to initiate anticoagulation 3. Chronic B-knee/Pain Management: Prn oxycodone. Will add Voltaren gel to knees.   -this patient is WELL known to me from outpt setting. Had substantial knee pain prior to this event. He was a little too dependent upon his meds for my liking however.  4. Mood: Monitor as mentation improve.  5. Neuropsych: This patient is not capable of making decisions on his own behalf. 6. Skin/Wound Care: Will need to monitor BLE for signs of breakdown. Continue air mattress overlay. Will get dietician to help with appropriate calorie needs.  7. DM type 2: Will monitor BS with ac/hs checks. Continue Levemir bid and adjust as indicated. 8. Enterobacter tracheobronchitis: Continue Levaquin D # 6/10 7. HTN/CAD: continue Norvasc daily and  metoprolol bid.  8. ABLA: on iron supplement. Recheck labs on Monday.    Post Admission Physician Evaluation: 1. Functional deficits secondary  to left  thalamic bleed with hydrocephalus requiring VP shunt. 2. Patient is admitted to receive  collaborative, interdisciplinary care between the physiatrist, rehab nursing staff, and therapy team. 3. Patient's level of medical complexity and substantial therapy needs in context of that medical necessity cannot be provided at a lesser intensity of care such as a SNF. 4. Patient has experienced substantial functional loss from his/her baseline which was documented above under the "Functional History" and "Functional Status" headings.  Judging by the patient's diagnosis, physical exam, and functional history, the patient has potential for functional progress which will result in measurable gains while on inpatient rehab.  These gains will be of substantial and practical use upon discharge  in facilitating mobility and self-care at the household level. 5. Physiatrist will provide 24 hour management of medical needs as well as oversight of the therapy plan/treatment and provide guidance as appropriate regarding the interaction of the two. 6. 24 hour rehab nursing will assist with bladder management, bowel management, safety, skin/wound care, disease management, medication administration, pain management and patient education  and help integrate therapy concepts, techniques,education, etc. 7. PT will assess and treat for/with: Lower extremity strength, range of motion, stamina, balance, functional mobility, safety, adaptive techniques and equipment, NMR, cognitive perceptual and visual perceptual rx.   Goals are: supervision to min assist. 8. OT will assess and treat for/with: ADL's, functional mobility, safety, upper extremity strength, adaptive techniques and equipment, NMR, cognitive perceptual and visual perceptual rx, leisure awareness, family education.   Goals are: supervision to min assist. 9. SLP will assess and treat for/with: swallowing, communication, cognition, language.  Goals are: min to mod  assist. 10. Case Management and Social Worker will assess and treat for psychological issues and discharge planning. 11. Team conference will be held weekly to assess progress toward goals and to determine barriers to discharge. 12. Patient will receive at least 3 hours of therapy per day at least 5 days per week. 13. ELOS: 20-25 days       14. Prognosis:  good     Meredith Staggers, MD, [place] [facility]    07/30/2014

## 2014-07-30 NOTE — H&P (View-Only) (Signed)
Physical Medicine and Rehabilitation Admission H&P    CC: Right sided weakness, aphasia,   HPI:  [name] is a 53 year old male with h/o DM type 2, CAD, OSA, chronic pain bilateral knees, CVA '11, PFO-on chronic coumadin who was admitted to OSH on 06/13/14 with left basal ganglia hemorrhage with intraventricular extension and hydrocephalus requiring right crani for evacuation as well as VP shunt placement  Hospital course complicated by acute respiratory failure requiring tracheostomy as well as PEG placement, BLE DVT requiring IVC placement as well as fevers due to enterobacter PNA. Patient with subsequent right hemiparesis, lethargy, nonverbal, requiring noxious stimuli for arousal, difficulty tracking, dysconjugate gaze as well as difficulty following commands.  He was transferred to  [facility] on 07/08/14 for vent wean and rehab. He was maintained on meropenum for HCAP and malignant HTN managed with medication changes. Antibiotics completed on 08/10 but patient developed recurrent fever 101 F on 08/16 with purulent drainage from trach. He was pan cultured started on Levaquin for treatment. Stool negative for C diff. UCS without growth. BC X 2 negative so far and pending. Respiratory culture grew moderate enterobacter cloacae with recommendations for 10 day treatment. BLE edema treated with compression wraps.  Trach downsized to CFS # 6 on 08/15 and PMSV is able to tolerate PMSV. On dysphagia 2 diet with thin liquids but poor po intake reported. He continues on supplemental TF at nights and diarrhea resolved.  Therapy ongoing with has had improvement in mentation as well as activity level. CIR recommended by Rehab and MD. He is admitted today for comprehensive inpatient rehab program.     Review of Systems  Unable to perform ROS: mental acuity   Past Medical History  Diagnosis Date  . GERD (gastroesophageal reflux disease)   . HLD (hyperlipidemia)   . HTN (hypertension)   . Hematuria  8/12  . PFO (patent foramen ovale)     echo 8/12: EF 55-65%, grade 2 diast dysfnx; +PFO on bubble study;  TEE 4/13: EF normal, atrial septum with suspicion for interatrial septum fenestrations without flow across and few large bubbles noted in LA.  This was not felt to require closure   . CAD (coronary artery disease)     a. s/p promus DES circumflex artery 10/13/10. b. Cath 04/2013: stable disease but possible small vessel disease -Imdur added.  Marland Kitchen DVT (deep venous thrombosis) 2013    "right"   . TIA (transient ischemic attack)     "1-2; both after the one in 02/2010" (09/25/2013)  . OSA on CPAP   . Type II diabetes mellitus     June 2014 AIC 6.2%/notes 09/24/2013; pt denies this hx on 09/25/2013  . Arthritis     "knees" (09/25/2013)  . Rheumatoid arthritis(714.0)   . CVA (cerebral vascular accident) 02/2010    "lost peripheral vision when I had the stroke; some memory issues since" (09/25/2013)    Past Surgical History  Procedure Laterality Date  . Knee arthroscopy Bilateral 2000's    2 on left and 3 on rt  . Tee without cardioversion  03/21/2012    Procedure: TRANSESOPHAGEAL ECHOCARDIOGRAM (TEE);  Surgeon: Fay Records, MD;  Location: [facility] ENDOSCOPY;  Service: Cardiovascular;  Laterality: N/A;  . Coronary angioplasty with stent placement  2012    "1" (09/24/2013)    Family History  Problem Relation Age of Onset  . Cancer Neg Hx   . Kidney disease Neg Hx   . Diabetes Neg Hx  Social History:  reports that he quit smoking about 2 years ago. His smoking use included Cigarettes. He has a 35 pack-year smoking history. He has never used smokeless tobacco. He reports that he does not drink alcohol or use illicit drugs.   Allergies  Allergen Reactions  . Iodine     REACTION: anaphylaxis  . Amlodipine     Foot edema   Medications Prior to Admission  Medication Sig Dispense Refill  . acetaminophen (TYLENOL) 160 MG/5ML liquid Take 160 mg by mouth every 4 (four) hours as needed for  fever.      Marland Kitchen aspirin EC 81 MG EC tablet Take 1 tablet (81 mg total) by mouth daily.      . carvedilol (COREG) 25 MG tablet Take 1 tablet (25 mg total) by mouth 2 (two) times daily with a meal.  300 tablet  3  . folic acid (FOLVITE) 1 MG tablet Take 1 mg by mouth daily.      . furosemide (LASIX) 20 MG tablet Take 20 mg by mouth daily.      . isosorbide mononitrate (IMDUR) 30 MG 24 hr tablet Take 1 tablet (30 mg total) by mouth 2 (two) times daily.  180 tablet  3  . losartan (COZAAR) 100 MG tablet Take 1 tablet (100 mg total) by mouth daily.  90 tablet  3  . mometasone (NASONEX) 50 MCG/ACT nasal spray Place 2 sprays into both nostrils daily as needed (allergies). To each nostril      . nitroGLYCERIN (NITROSTAT) 0.4 MG SL tablet Place 1 tablet (0.4 mg total) under the tongue every 5 (five) minutes as needed for chest pain (up to 3 doses).  25 tablet  3  . OxyCODONE (OXYCONTIN) 40 mg T12A 12 hr tablet Take 1 tablet (40 mg total) by mouth every 12 (twelve) hours.  60 tablet  0  . oxyCODONE (ROXICODONE) 15 MG immediate release tablet Take 1 tablet (15 mg total) by mouth every 4 (four) hours as needed for pain.  90 tablet  0  . pantoprazole (PROTONIX) 40 MG tablet Take 1 tablet (40 mg total) by mouth 2 (two) times daily before a meal.  60 tablet  5  . potassium chloride SA (KLOR-CON M20) 20 MEQ tablet Take 4 tablets (80 mEq total) by mouth 2 (two) times daily.  224 tablet  11  . warfarin (COUMADIN) 7.5 MG tablet Take as directed by coumadin clinic  30 tablet  3    Home:     Functional History: Independent prior to bleed.   Functional Status:  Mobility: Able to roll in bed with Max assist of 1-2 Able to sit EOB X 20 mins with min assist and tendency to lean to left.  Requires total A +2 with draw sheet to transfer to chair.     ADL: Working on ROM and BLE wraps. Working on self feeding with built up utensils.   Cognition: Able to verbalize 1-2 words.     Physical Exam: There were no  vitals taken for this visit. Physical Exam  Nursing note and vitals reviewed. Constitutional: He appears well-developed and well-nourished.  HENT:  Head: Normocephalic.  Right Ear: External ear normal.  Left Ear: External ear normal.  Right crani incision healing well.   Eyes: Conjunctivae and EOM are normal. Pupils are equal, round, and reactive to light.  Occasionally closes left eye.   Neck: No thyromegaly present.  #6 Trach in place with PMSV and tach collar.   Cardiovascular: Normal  rate and regular rhythm.   No murmur heard. Respiratory: Effort normal. No respiratory distress. He has decreased breath sounds in the right lower field and the left lower field. He has wheezes in the right upper field and the left upper field. He has no rales. He exhibits no tenderness.  GI: Soft. Bowel sounds are normal. He exhibits no distension. There is no tenderness. There is no rebound and no guarding.  PEG site clean and dry without drainage.   Musculoskeletal:  BLE with compression wraps. Edema RLE >LLE with discomfort with ROM. Edema also present in both UE's.  Neurological:  Needed sternal rubs as well as max cues to arouse and participate with exam and was able to maintain arousal for latter part of exam. Pupils dilated.  Able to state name but unable to answer basic Y/N questions--question apraxic component. Delayed processing and was able to follow occasional simple one step command. likely visual deficits with left gaze preference noted. Did attempt to track to all fields on exam.. Right sided weakness noted more than left but decreased ability to consistently. Decreased sensation of PP and LT in all 4's.  participate in exam.  Lacks insight/awareness of deficits.   Skin: Skin is warm and dry.  Psychiatric:  Lethargic, limited     BMET    Component Value Date/Time   NA 143 07/27/2014 0709   K 4.8 07/27/2014 0709   CL 101 07/27/2014 0709   CO2 26 07/27/2014 0709   GLUCOSE 111* 07/27/2014  0709   BUN 14 07/27/2014 0709   CREATININE 0.86 07/27/2014 0709   CALCIUM 9.5 07/27/2014 0709   GFRNONAA >90 07/27/2014 0709   GFRAA >90 07/27/2014 0709   CBC Latest Ref Rng 07/27/2014 07/25/2014 07/19/2014  WBC 4.0 - 10.5 K/uL 6.9 8.5 7.0  Hemoglobin 13.0 - 17.0 g/dL 9.8(L) 10.2(L) 9.4(L)  Hematocrit 39.0 - 52.0 % 31.1(L) 32.6(L) 30.2(L)  Platelets 150 - 400 K/uL 242 PLATELET CLUMPS NOTED ON SMEAR, COUNT APPEARS ADEQUATE 256    07/12/14 Brain CT:  IMPRESSION:  1. Negative for hydrocephalus. Ventricular volume is decreased  compared to preoperative head CT 04/15/2014.  2. Trace subdural effusion or chronic hematoma in the right frontal  parietal region.  3. Intraventricular hemorrhage and probable subacute hematoma in the  left thalamus. No comparison imaging to document stability.   Medical Problem List and Plan: 1. Functional deficits secondary to Left thalamic bleed with hydrocephalus.  2.  DVT Prophylaxis/Anticoagulation: Pharmaceutical: Lovenox--is now 6 weeks out and should be able to initiate anticoagulation 3. Chronic B-knee/Pain Management: Prn oxycodone. Will add Voltaren gel to knees.   -this patient is WELL known to me from outpt setting. Had substantial knee pain prior to this event. He was a little too dependent upon his meds for my liking however.  4. Mood: Monitor as mentation improve.  5. Neuropsych: This patient is not capable of making decisions on his own behalf. 6. Skin/Wound Care: Will need to monitor BLE for signs of breakdown. Continue air mattress overlay. Will get dietician to help with appropriate calorie needs.  7. DM type 2: Will monitor BS with ac/hs checks. Continue Levemir bid and adjust as indicated. 8. Enterobacter tracheobronchitis: Continue Levaquin D # 6/10 7. HTN/CAD: continue Norvasc daily and  metoprolol bid.  8. ABLA: on iron supplement. Recheck labs on Monday.    Post Admission Physician Evaluation: 1. Functional deficits secondary  to left  thalamic bleed with hydrocephalus requiring VP shunt. 2. Patient is admitted to receive  collaborative, interdisciplinary care between the physiatrist, rehab nursing staff, and therapy team. 3. Patient's level of medical complexity and substantial therapy needs in context of that medical necessity cannot be provided at a lesser intensity of care such as a SNF. 4. Patient has experienced substantial functional loss from his/her baseline which was documented above under the "Functional History" and "Functional Status" headings.  Judging by the patient's diagnosis, physical exam, and functional history, the patient has potential for functional progress which will result in measurable gains while on inpatient rehab.  These gains will be of substantial and practical use upon discharge  in facilitating mobility and self-care at the household level. 5. Physiatrist will provide 24 hour management of medical needs as well as oversight of the therapy plan/treatment and provide guidance as appropriate regarding the interaction of the two. 6. 24 hour rehab nursing will assist with bladder management, bowel management, safety, skin/wound care, disease management, medication administration, pain management and patient education  and help integrate therapy concepts, techniques,education, etc. 7. PT will assess and treat for/with: Lower extremity strength, range of motion, stamina, balance, functional mobility, safety, adaptive techniques and equipment, NMR, cognitive perceptual and visual perceptual rx.   Goals are: supervision to min assist. 8. OT will assess and treat for/with: ADL's, functional mobility, safety, upper extremity strength, adaptive techniques and equipment, NMR, cognitive perceptual and visual perceptual rx, leisure awareness, family education.   Goals are: supervision to min assist. 9. SLP will assess and treat for/with: swallowing, communication, cognition, language.  Goals are: min to mod  assist. 10. Case Management and Social Worker will assess and treat for psychological issues and discharge planning. 11. Team conference will be held weekly to assess progress toward goals and to determine barriers to discharge. 12. Patient will receive at least 3 hours of therapy per day at least 5 days per week. 13. ELOS: 20-25 days       14. Prognosis:  good     Meredith Staggers, MD, [facility]    07/30/2014

## 2014-07-30 NOTE — PMR Pre-admission (Signed)
Secondary [employer]  PMR Admission Coordinator Pre-Admission Assessment  Patient: Tony Davis is an 53 y.o., male  MRN: [id number]  DOB: 1961-04-01  Height: _0  (180.3 cm)  Weight: 124.739 kg (275 lb)  Insurance Information  PRIMARY: Medicare A & B Policy#: [id number] a Subscriber: self  Pre-Cert#: verified in [employer] Employer: not working  [employer] Date: A: 08-10-12; B: 11-09-13 Deduct: $1260 Out of Pocket Max: none Life Max: unlimited  CIR: 100% SNF: 100% days 1-20; 80% days 21-100 (100 day visit max)  Outpatient: 80% Co-Pay: 20%  Home Health: 100% Co-Pay: none, no visit limits  DME: 80% Co-Pay: 20%  Providers: pt's preference.  Note: pt came to [facility] on 07-08-14 with 35 full inpatient days left. Pt has used 22 full inpatient days while at [facility] and has 13 full inpatient days left. Pt has 30 full inpatient copay days ($315 copay/day). Wife is aware.  Note: Pt's wife is considering placing pt on her [facility] from her work and has started that initial process.  Emergency Contact Information  Contact Information    Name  Relation  Home  Work  Mobile    Hail,Valencia  Spouse  [phone]   [phone]    CHRISTIPHER, RIEGER    [phone]      Current Medical History  Patient Admitting Diagnosis: deconditioning following basal ganglia hemorrhage/VP shunt and respiratory failure  History of Present Illness: Tony Davis is a 53 year old male with h/o DM type 2, CAD, OSA, chronic pain bilateral knees, CVA '11, PFO-on chronic coumadin who was admitted to OSH on 06/13/14 with left basal ganglia hemorrhage with intraventricular extension and hydrocephalus requiring VP shunt placement Hospital course complicated by acute respiratory failure requiring tracheostomy as well as PEG placement, BLE DVT requiring IVC placement as well as fevers due to enterobacter PNA. Patient with subsequent right hemiparesis, lethargy, nonverbal, requiring noxious stimuli for arousal,  difficulty tracking, dysconjugate gaze as well as difficulty following commands. He was transferred to [facility] on 07/08/14 for vent wean and rehab. He was maintained on meropenum for HCAP and malignant HTN managed with medication changes. Antibiotics completed on 08/10 but patient developed recurrent fever 101 F on 08/16 with purulent drainage from trach. He was pan cultured started on Levaquin for treatment. Stool negative for C diff. UCS without growth. BC X 2 negative so far and pending. Respiratory culture grew moderate enterobacter cloacae with recommendations for 10 day treatment. BLE edema treated with compression wraps. Trach downsized to CFS # 6 on 08/15 and PMSV is able to tolerate PMSV. On dysphagia 2 diet with thin liquids but poor po intake reported. He continues on supplemental TF at nights and diarrhea resolved. Therapy ongoing with has had improvement in mentation as well as activity level. CIR recommended by Rehab and MD. He is admitted today for comprehensive inpatient rehab program.  Patient's medical record from [facility] has been reviewed by the rehabilitation admission coordinator and physician.  Past Medical History  Past Medical History   Diagnosis  Date   .  GERD (gastroesophageal reflux disease)    .  HLD (hyperlipidemia)    .  HTN (hypertension)    .  Hematuria  8/12   .  PFO (patent foramen ovale)      echo 8/12: EF 55-65%, grade 2 diast dysfnx; +PFO on bubble study; TEE 4/13: EF normal, atrial septum with suspicion for interatrial septum fenestrations without flow across and few large bubbles noted in LA.  This was not felt to require closure   .  CAD (coronary artery disease)      a. s/p promus DES circumflex artery 10/13/10. b. Cath 04/2013: stable disease but possible small vessel disease -Imdur added.   Marland Kitchen  DVT (deep venous thrombosis)  2013     "right"   .  TIA (transient ischemic attack)      "1-2; both after the one in 02/2010" (09/25/2013)   .  OSA on  CPAP    .  Type II diabetes mellitus      June 2014 AIC 6.2%/notes 09/24/2013; pt denies this hx on 09/25/2013   .  Arthritis      "knees" (09/25/2013)   .  Rheumatoid arthritis(714.0)    .  CVA (cerebral vascular accident)  02/2010     "lost peripheral vision when I had the stroke; some memory issues since" (09/25/2013)    Family History  family history is negative for Cancer, Kidney disease, and Diabetes.  Prior Rehab/Hospitalizations: Pt has had two prior CVA's (in 2011 and 2013) and pt has had outpatient rehab.  Current Medications  See MAR  Patients Current Diet: Dys 2, with PMV, thin liquids with full assist (Note: PEG feedings done nightly from 7 pm to 7 am)  Precautions / Restrictions  Precautions  Precautions: Fall  Prior Activity Level  Community (5-7x/wk): (Pt used a cane or rollator at times due to knee OA per previous Acupuncturist)  Home Assistive Devices / [place] Devices/Equipment: Cane (specify quad or straight);Walker (specify type)   Prior Functional Level  Current Functional Level   Bed Mobility  Independent  Max assist (sit EOB at minimal to CGA with L trunk lean at times)   Transfers  Independent  Total assist (total assist x 2 stand pivot transfer to chair)   Mobility - Walk/Wheelchair  Independent (sometimes used cane or rolling walker for ambulation)  Other (not assessed at this time)   Upper Body Dressing  Independent  Other (not assessed at this time)   Lower Body Dressing  Independent  Other (not assessed at this time)   Grooming  Independent  Other (set up assist with mouthcare and to wash his mouth)   Eating/Drinking  Independent  Other (self feeding with mod assist, motor planning issues)   Toilet Transfer  Independent  Other (not assessed at this time)   Bladder Continence  [facility]  incontinent   Bowel Management  WFL  incontinent   Stair Climbing  Independent  Other (not assessed at this time)   Communication  WFL  speaking limited  words in a soft voice   Memory  WFL  unable to assess at this time   Cooking/Meal Prep  Independent    Housework  Independent    Money Management  Independent    Driving  Independent    Special needs/care consideration  BiPAP/CPAP no  CPM no  Continuous Drip IV no  Dialysis no  Life Vest no  Oxygen - currently on trach collar with humidified air  Special Bed no  Trach Size - Shiley size 8, using PMV  Wound Vac (area) no  Skin - healed incisions from staples along R head, RN states pt's bottom is red  Bowel mgmt: incontinent  Bladder mgmt: incontinent  Diabetic mgmt - per wife, pt was not diabetic prior to getting sick, but "he is now"  PEG: night feedings from 7 pm to 7 am and pt has a dys 2  diet during the day (see above)  Pt with R UE peripheral IV  Previous Home Environment  Living Arrangements: Spouse/significant other;Children  Lives With: Spouse  Available Help at Discharge: (Wife will be working (typical day schedule) but pt's mother from [place] and father-in-law plan to alternate days to care for pt while wife is at work. Pt's cousin is also available to help. Neighbors have also volunteered to help.)  Type of Home: House  Home Layout: One level  Home Access: Stairs to enter  Entrance Stairs-Rails: Nurse, mental health of Steps: 2  Bathroom Shower/Tub: Administrator, Civil Service: Standard  Discharge Living Setting  Plans for Discharge Living Setting: Patient's home  Type of Home at Discharge: House  Discharge Home Layout: One level  Discharge Home Access: Stairs to enter  Entrance Stairs-Rails: Nurse, mental health of Steps: 2  Discharge Bathroom Shower/Tub: Tub/shower unit  Discharge Bathroom Toilet: Standard  [place]  Patient Roles: Spouse  Contact Information: wife Richardson Chiquito is primary contact  Anticipated Caregiver: pt's mother and father-in-law will be alternating days while wife is at work   Anticipated Ambulance person Information: see above  Ability/Limitations of Caregiver: wife will be working during day. Pt's mother and father-in-law will be alternating caring for pt during day. Wife will be primary caregiver in evenings. Pt's cousin also available to help and neighbors have volunteered.  Caregiver Availability: 24/7  Discharge Plan Discussed with Primary Caregiver: Yes (discussed with pt's wife by phone on 8-20 and 8-21)  Is Caregiver In Agreement with Plan?: Yes  Does Caregiver/Family have Issues with Lodging/Transportation while Pt is in Rehab?: No  Goals/Additional Needs  Patient/Family Goal for Rehab: minimal assistance with PT, OT and SLP  Expected length of stay: 14-18 days  Cultural Considerations: Christian  Dietary Needs: Dys 2 with PMV, thin liquids with full assist. PEG feedings nightly. See above comments.  Equipment Needs: to be determined  Pt/Family Agrees to Admission and willing to participate: Yes  Program Orientation Provided & Reviewed with Pt/Caregiver Including Roles & Responsibilities: Yes  Patient Condition: I met with pt at [facility] on 07-29-14 and spoke with pt's wife by phone to explain the possibility of our acute inpatient rehab program. This 53 year old patient was visiting family in [place] when he started to have a headache and mental status changes. He was admitted to OSH in [place] on 06/13/14 with left basal ganglia hemorrhage with intraventricular extension and hydrocephalus requiring ventricular drain. Hospital course complicated by acute respiratory failure requiring tracheostomy as well as PEG placement, BLE DVT requiring IVC placement as well as fevers due to PNA. Pt is currently requiring max assist with bed mobility and can sit on EOB with minimal to contact guard assist. He is needed total assist of 2 staff for stand pivot transfers to a chair. Pt's self care skills are also limited and he needs moderate assist for  mouth care skills/washing his face. Pt has a speaking valve with his trach and is on a dysphagia 2 diet with thin liquids (full assist) in addition to PEG tube feedings. Pt will benefit from skilled speech services to progress his diet needs and help identify cognitive issues now that he is becoming more verbal with speaking valve. He will benefit greatly from the multi-disciplinary team of skilled PT, OT and rehab nursing to maximize his functional return following this basal ganglia hemorrhage and respiratory failure. PT, OT and rehab nursing will focus on increasing strength for greater independence  in bed mobility, transfers, mobility and self-care skills. Rehab nursing will also focus on pt/family education in regards to medication management and bladder/bowel issues. In addition, pt will benefit from rehab physician intervention to monitor his HTN, diabetes and respiratory status in light of hemorrhage. Discussed case with Dr. Naaman Plummer and rehab PA who stated that pt is a good inpatient rehab candidate. We received medical clearance from Dr. Laren Everts at [facility]. Pt's wife is in support of this next transition to acute rehab and motivated to have her husband return home with family assist. He will benefit from the intensive services of skilled therapy under rehab physician guidance and will be admitted today on 07-30-14.  Preadmission Screen Completed By: Nanetta Batty, PT, 07/30/2014 10:14 AM  ______________________________________________________________________  Discussed status with Dr. Naaman Plummer on 07-30-14 at 1014 and received telephone approval for admission today.  Admission Coordinator: Nanetta Batty, PT, time 1014/Date 07-30-14  Assessment/Plan:  Diagnosis: left BG ICH and multiple medical sequelae 1. Does the need for close, 24 hr/day Medical supervision in concert with the patient's rehab needs make it unreasonable for this patient to be served in a less intensive setting? Yes   2. Co-Morbidities requiring supervision/potential complications: respiratory failure, dysphagia 3. Due to bladder management, bowel management, skin/wound care, disease management and medication administration, does the patient require 24 hr/day rehab nursing? Yes 4. Does the patient require coordinated care of a physician, rehab nurse, PT (1-2 hrs/day, 5 days/week), OT (1-2 hrs/day, 5 days/week) and SLP (1-2 hrs/day, 5 days/week) to address physical and functional deficits in the context of the above medical diagnosis(es)? Yes Addressing deficits in the following areas: balance, endurance, locomotion, strength, transferring, bowel/bladder control, bathing, dressing, feeding, grooming, toileting, cognition, speech, language, swallowing and psychosocial support 5. Can the patient actively participate in an intensive therapy program of at least 3 hrs of therapy 5 days a week? Yes 6. The potential for patient to make measurable gains while on inpatient rehab is excellent 7. Anticipated functional outcomes upon discharge from inpatients are: min assist PT, min assist OT, min assist SLP 8. Estimated rehab length of stay to reach the above functional goals is: 14-18 days 9. Does the patient have adequate social supports to accommodate these discharge functional goals? Yes 10. Anticipated D/C setting: Home 11. Anticipated post D/C treatments: [place] therapy 12. Overall Rehab/Functional Prognosis: excellent RECOMMENDATIONS:  This patient's condition is appropriate for continued rehabilitative care in the following setting: CIR  Patient has agreed to participate in recommended program. Yes  Note that insurance prior authorization may be required for reimbursement for recommended care.   Comment: Admit to inpatient rehab today  Meredith Staggers, MD, [place], [place]  07/30/2014

## 2014-07-30 NOTE — Interval H&P Note (Signed)
Tony Davis was admitted today to Inpatient Rehabilitation with the diagnosis of Left Basal ganglia ICH.  The patient's history has been reviewed, patient examined, and there is no change in status.  Patient continues to be appropriate for intensive inpatient rehabilitation.  I have reviewed the patient's chart and labs.  Questions were answered to the patient's satisfaction.  [name] 07/30/2014, 10:07 PM

## 2014-07-31 ENCOUNTER — Inpatient Hospital Stay (HOSPITAL_COMMUNITY): Payer: Medicare Other | Admitting: Speech Pathology

## 2014-07-31 ENCOUNTER — Inpatient Hospital Stay (HOSPITAL_COMMUNITY): Payer: Medicare Other | Admitting: Occupational Therapy

## 2014-07-31 ENCOUNTER — Inpatient Hospital Stay (HOSPITAL_COMMUNITY): Payer: Medicare Other | Admitting: Physical Therapy

## 2014-07-31 ENCOUNTER — Encounter (HOSPITAL_COMMUNITY): Payer: Self-pay | Admitting: *Deleted

## 2014-07-31 DIAGNOSIS — R5381 Other malaise: Secondary | ICD-10-CM

## 2014-07-31 DIAGNOSIS — Z5189 Encounter for other specified aftercare: Secondary | ICD-10-CM

## 2014-07-31 LAB — GLUCOSE, CAPILLARY
GLUCOSE-CAPILLARY: 91 mg/dL (ref 70–99)
GLUCOSE-CAPILLARY: 100 mg/dL — AB (ref 70–99)
GLUCOSE-CAPILLARY: 92 mg/dL (ref 70–99)
GLUCOSE-CAPILLARY: 115 mg/dL — AB (ref 70–99)
GLUCOSE-CAPILLARY: 99 mg/dL (ref 70–99)

## 2014-07-31 MED ORDER — CETYLPYRIDINIUM CHLORIDE 0.05 % MT LIQD
7.0000 mL | Freq: Two times a day (BID) | OROMUCOSAL | Status: DC
Start: 2014-07-31 — End: 2014-09-07
  Administered 2014-07-31 – 2014-09-07 (×60): 7 mL via OROMUCOSAL

## 2014-07-31 MED ORDER — VITAL 1.5 CAL PO LIQD
250.0000 mL | Freq: Three times a day (TID) | ORAL | Status: DC
  Administered 2014-07-31 – 2014-08-02 (×7): 250 mL
  Filled 2014-07-31 (×10): qty 474

## 2014-07-31 MED ORDER — CHLORHEXIDINE GLUCONATE 0.12 % MT SOLN
15.0000 mL | Freq: Two times a day (BID) | OROMUCOSAL | Status: DC
  Administered 2014-07-31 – 2014-09-07 (×71): 15 mL via OROMUCOSAL
  Filled 2014-07-31 (×82): qty 15

## 2014-07-31 NOTE — Progress Notes (Addendum)
INITIAL NUTRITION ASSESSMENT  DOCUMENTATION CODES Per approved criteria  -Obesity Unspecified   INTERVENTION: -Initiate Vital 1.5 @ 250 ml TID bolus feeds between meals -30 ml Prostat TID.   -Tube feeding regimen provides 1425 kcal (70% of needs), 96 grams of protein (96% est protein needs), and 573 ml of H2O.  -Calorie count- 72 hour; RD to follow up on 8/24 -Diet textures per SLP -Will continue to monitor  NUTRITION DIAGNOSIS: Inadequate oral intake related to decreased appetite as evidenced by PO intake < 75%.   Goal: TF + PO to meet >/= 90% of their estimated nutrition needs    Monitor:  TF tolerance, total protein/energy intake, labs, weights, swallow profile  Reason for Assessment: Consult  53 y.o. male  Admitting Dx: <principal problem not specified>  ASSESSMENT: Tony Davis is a 53 year old male with h/o DM type 2, CAD, OSA, chronic pain bilateral knees, CVA '11, PFO-on chronic coumadin who was admitted to OSH on 06/13/14 with left basal ganglia hemorrhage with intraventricular extension and hydrocephalus requiring right crani for evacuation as well as VP shunt placement Hospital course complicated by acute respiratory failure requiring tracheostomy as well as PEG placement, BLE DVT requiring IVC placement as well as fevers due to enterobacter PNA. Patient with subsequent right hemiparesis, lethargy, nonverbal, requiring noxious stimuli for arousal, difficulty tracking, dysconjugate gaze as well as difficulty following commands  -Pt endorsed decreased appetite, however was unable to determine when decreased PO intake began -Is currently receiving feeding assistance, RD viewed breakfast tray with <25% completion. Pt had only consumed <50% of [employer] protein supplement -Weight appears to have maintained stable per previous medical records, fluctuates between 270-278 lbs in past 3 months (2.8% body weight, non-severe for time frame) -Receiving Vital 1.5 at nocturnal  feeds of 6pm-6am at 65 ml/hr w/Pro-stat TID. MD requesting advancing to bolus feeds until PO intake improves -Modified regimen to Vital 1.5 at 250 ml TID between meals   Height: Ht Readings from Last 1 Encounters:  07/30/14 5\' 11"  (1.803 m)    Weight: Wt Readings from Last 1 Encounters:  07/30/14 278 lb (126.1 kg)    Ideal Body Weight: 172 lbs  % Ideal Body Weight: 162  Wt Readings from Last 10 Encounters:  07/30/14 278 lb (126.1 kg)  07/30/14 275 lb (124.739 kg)  05/27/14 277 lb (125.646 kg)  05/13/14 278 lb (126.1 kg)  04/28/14 277 lb (125.646 kg)  04/14/14 270 lb (122.471 kg)  04/12/14 271 lb 12.8 oz (123.288 kg)  03/29/14 272 lb (123.378 kg)  03/08/14 265 lb 12.8 oz (120.566 kg)  02/26/14 263 lb (119.296 kg)    Usual Body Weight: 275 lbs per previous med records  % Usual Body Weight: 101  BMI:  Body mass index is 38.79 kg/(m^2).  Estimated Nutritional Needs: Kcal: 2000-2200 Protein: 100-110 gram Fluid: >/=2000 ml/daily  Skin: WDL, + 1 gen edema  Diet Order: Dysphagia2/thin  EDUCATION NEEDS: -No education needs identified at this time  No intake or output data in the 24 hours ending 07/31/14 1044  Last BM: 8/19   Labs:   Recent Labs Lab 07/27/14 0709  NA 143  K 4.8  CL 101  CO2 26  BUN 14  CREATININE 0.86  CALCIUM 9.5  GLUCOSE 111*    CBG (last 3)   Recent Labs  07/30/14 2254 07/31/14 0751  GLUCAP 91 100*    Scheduled Meds: . amLODipine  5 mg Oral Daily  . antiseptic oral rinse  7  mL Mouth Rinse q12n4p  . atorvastatin  20 mg Oral q1800  . chlorhexidine  15 mL Mouth Rinse BID  . diclofenac sodium  2 g Topical QID  . enoxaparin (LOVENOX) injection  40 mg Subcutaneous Q24H  . famotidine  20 mg Oral Daily  . feeding supplement (PRO-STAT SUGAR FREE 64)  30 mL Oral TID WC  . feeding supplement (VITAL 1.5 CAL)  250 mL Per Tube TID BM  . ferrous gluconate  324 mg Oral BID WC  . insulin aspart  0-15 Units Subcutaneous TID WC  .  insulin aspart  0-5 Units Subcutaneous QHS  . insulin detemir  55 Units Subcutaneous BID  . levofloxacin  750 mg Oral Daily  . metoprolol tartrate  75 mg Oral BID  . modafinil  200 mg Oral Daily  . saccharomyces boulardii  250 mg Oral BID    Continuous Infusions:   Past Medical History  Diagnosis Date  . GERD (gastroesophageal reflux disease)   . HLD (hyperlipidemia)   . HTN (hypertension)   . Hematuria 8/12  . PFO (patent foramen ovale)     echo 8/12: EF 55-65%, grade 2 diast dysfnx; +PFO on bubble study;  TEE 4/13: EF normal, atrial septum with suspicion for interatrial septum fenestrations without flow across and few large bubbles noted in LA.  This was not felt to require closure   . CAD (coronary artery disease)     a. s/p promus DES circumflex artery 10/13/10. b. Cath 04/2013: stable disease but possible small vessel disease -Imdur added.  Marland Kitchen DVT (deep venous thrombosis) 2013    "right"   . TIA (transient ischemic attack)     "1-2; both after the one in 02/2010" (09/25/2013)  . OSA on CPAP   . Type II diabetes mellitus     June 2014 AIC 6.2%/notes 09/24/2013; pt denies this hx on 09/25/2013  . Arthritis     "knees" (09/25/2013)  . Rheumatoid arthritis(714.0)   . CVA (cerebral vascular accident) 02/2010    "lost peripheral vision when I had the stroke; some memory issues since" (09/25/2013)    Past Surgical History  Procedure Laterality Date  . Knee arthroscopy Bilateral 2000's    2 on left and 3 on rt  . Tee without cardioversion  03/21/2012    Procedure: TRANSESOPHAGEAL ECHOCARDIOGRAM (TEE);  Surgeon: Fay Records, MD;  Location: [facility] ENDOSCOPY;  Service: Cardiovascular;  Laterality: N/A;  . Coronary angioplasty with stent placement  2012    "1" (09/24/2013)    Atlee Abide MS RD LDN Clinical Dietitian [phone]

## 2014-07-31 NOTE — Evaluation (Signed)
Physical Therapy Assessment and Plan  Patient Details  Name: Tony Davis MRN: [id number] Date of Birth: 15-Dec-1960  PT Diagnosis: Abnormal posture, Cognitive deficits, Coordination disorder, Difficulty walking, Edema, Impaired sensation, Muscle weakness and Pain in B knees (premorbid) Rehab Potential: Fair ELOS: 28-31 days   Today's Date: 07/31/2014 PT Individual Time: 1100-1200 60 minutes   Problem List:  Patient Active Problem List   Diagnosis Date Noted  . Basal ganglia hemorrhage 07/30/2014  . Syncope due to orthostatic hypotension 04/15/2014  . Medication adverse effect 04/15/2014  . Syncope 04/15/2014  . Routine general medical examination at a health care facility 02/11/2014  . BPH associated with nocturia 02/11/2014  . Gynecomastia 02/11/2014  . Osteoarthritis of left knee 08/21/2013  . Osteoarthritis of right knee 08/21/2013  . Insomnia, persistent 03/25/2013  . History of tobacco use 03/25/2013  . Chronic rhinitis 03/25/2013  . Hemiparesis due to old cerebral infarction 03/20/2013  . DJD (degenerative joint disease) of knee 10/13/2012  . Folate deficiency anemia 10/13/2012  . Long term (current) use of anticoagulants 03/28/2012  . DVT (deep venous thrombosis) 03/19/2012  . Obstructive sleep apnea 08/16/2011  . Type II or unspecified type diabetes mellitus without mention of complication, uncontrolled 08/06/2011  . PATENT FORAMEN OVALE 03/13/2010  . CVA 02/08/2010  . CAD, NATIVE VESSEL 01/26/2010  . Hyperlipidemia LDL goal < 100 01/23/2010  . Essential hypertension, benign 01/23/2010  . GERD 01/23/2010    Past Medical History:  Past Medical History  Diagnosis Date  . GERD (gastroesophageal reflux disease)   . HLD (hyperlipidemia)   . HTN (hypertension)   . Hematuria 8/12  . PFO (patent foramen ovale)     echo 8/12: EF 55-65%, grade 2 diast dysfnx; +PFO on bubble study;  TEE 4/13: EF normal, atrial septum with suspicion for interatrial septum  fenestrations without flow across and few large bubbles noted in LA.  This was not felt to require closure   . CAD (coronary artery disease)     a. s/p promus DES circumflex artery 10/13/10. b. Cath 04/2013: stable disease but possible small vessel disease -Imdur added.  Marland Kitchen DVT (deep venous thrombosis) 2013    "right"   . TIA (transient ischemic attack)     "1-2; both after the one in 02/2010" (09/25/2013)  . OSA on CPAP   . Type II diabetes mellitus     June 2014 AIC 6.2%/notes 09/24/2013; pt denies this hx on 09/25/2013  . Arthritis     "knees" (09/25/2013)  . Rheumatoid arthritis(714.0)   . CVA (cerebral vascular accident) 02/2010    "lost peripheral vision when I had the stroke; some memory issues since" (09/25/2013)   Past Surgical History:  Past Surgical History  Procedure Laterality Date  . Knee arthroscopy Bilateral 2000's    2 on left and 3 on rt  . Tee without cardioversion  03/21/2012    Procedure: TRANSESOPHAGEAL ECHOCARDIOGRAM (TEE);  Surgeon: Fay Records, MD;  Location: [facility] ENDOSCOPY;  Service: Cardiovascular;  Laterality: N/A;  . Coronary angioplasty with stent placement  2012    "1" (09/24/2013)    Assessment & Plan Clinical Impression:  [name] is a 53 year old male with h/o DM type 2, CAD, OSA, chronic pain bilateral knees, CVA '11, PFO-on chronic coumadin who was admitted to OSH on 06/13/14 with left basal ganglia hemorrhage with intraventricular extension and hydrocephalus requiring right crani for evacuation as well as VP shunt placement Hospital course complicated by acute respiratory failure requiring  tracheostomy as well as PEG placement, BLE DVT requiring IVC placement as well as fevers due to enterobacter PNA. Patient with subsequent right hemiparesis, lethargy, nonverbal, requiring noxious stimuli for arousal, difficulty tracking, dysconjugate gaze as well as difficulty following commands. He was transferred to [facility] on 07/08/14 for vent wean and rehab. He  was maintained on meropenum for HCAP and malignant HTN managed with medication changes. Antibiotics completed on 08/10 but patient developed recurrent fever 101 F on 08/16 with purulent drainage from trach. He was pan cultured started on Levaquin for treatment. Stool negative for C diff. UCS without growth. BC X 2 negative so far and pending. Respiratory culture grew moderate enterobacter cloacae with recommendations for 10 day treatment. BLE edema treated with compression wraps. Trach downsized to CFS # 6 on 08/15 and PMSV is able to tolerate PMSV. On dysphagia 2 diet with thin liquids but poor po intake reported. He continues on supplemental TF at nights and diarrhea resolved. Therapy ongoing with has had improvement in mentation as well as activity level.   Patient transferred to CIR on 07/30/2014 .   Patient currently requires total with mobility secondary to muscle weakness, decreased cardiorespiratoy endurance and decreased oxygen support, impaired timing and sequencing, motor apraxia, decreased coordination and decreased motor planning, decreased visual perceptual skills, decreased visual motor skills and impaired peripheral vision (premorbid), decreased attention to left, decreased attention to right, decreased motor planning and ideational apraxia, decreased initiation, decreased attention, decreased awareness, decreased problem solving, decreased safety awareness and decreased memory and decreased sitting balance, decreased standing balance, decreased postural control and decreased balance strategies.  Prior to hospitalization, patient was modified independent  with mobility and lived with Spouse in a House home.  Home access is 2Stairs to enter.  Patient will benefit from skilled PT intervention to maximize safe functional mobility, minimize fall risk and decrease caregiver burden for planned discharge home with 24 hour assist.  Anticipate patient will benefit from follow up [facility] at discharge.      Skilled Therapeutic Intervention PT Evaluation: PT initiated functional mobility assessment, limited by no abdominal binder available for PEG tube and RN instructed PT to not sit pt up (EOB or in w/c) until abdominal binder arrived (she ordered one then). PT also instructed RN to order pt B PRAFO boots, as he has low initiation. Pt has very low level cognition and very lethargic, but arouses more with PROM to all four extremities. Pt able to state his name, otherwise questionable accuracy of yes/no head nods/shakes. Pt likely extremely apraxic due to area of infarct, follows PT commands < 10% of the time.   Therapeutic Activity: Pt is currently +2 assist for all bed mobility: rolling R, rolling L, and scooting up in bed. PT instructed pt to reach for the bedrail with UE to assist will rolling and he eventually did comply with this command in each direction.   Therapeutic Exercise: PT completes PROM to all four extremities: shoulders, elbows, grip, hip, knee, ankle - all major planes (wrist planes not completed).    PT Evaluation Precautions/Restrictions Precautions Precautions: Fall;Other (comment) Precaution Comments: PEG/abdominal precautions, trach, Bilateral thigh high TED hose  Restrictions Weight Bearing Restrictions: No General Chart Reviewed: Yes Family/Caregiver Present: No Vital SignsTherapy Vitals Pulse Rate: 90 BP: 134/81 mmHg Patient Position (if appropriate): Lying Oxygen Therapy SpO2: 97 % O2 Device: Trach collar O2 Flow Rate (L/min): 5 L/min FiO2 (%): 28 % Pulse Oximetry Type: Continuous Pain Pain Assessment Pain Assessment: No/denies pain Home Living/Prior Functioning  Home Living Type of Home: House Home Access: Stairs to enter Entrance Stairs-Number of Steps: 2 Entrance Stairs-Rails: Right;Left Home Layout: One level  Lives With: Spouse Prior Function Level of Independence: Independent with basic ADLs;Independent with transfers;Independent with  gait;Independent with homemaking with ambulation  Able to Take Stairs?: Yes Comments: uses RW or cane for ambulation Vision/Perception  Vision - Assessment Additional Comments: unable to assess due to pt lethargy; chart notes peripheral vision impairment  Cognition Overall Cognitive Status: Impaired/Different from baseline Arousal/Alertness: Lethargic Orientation Level: Oriented to person Attention: Focused;Sustained Focused Attention: Impaired Focused Attention Impairment: Verbal basic Sustained Attention: Impaired Sustained Attention Impairment: Verbal basic Memory: Impaired Awareness: Impaired Awareness Impairment: Emergent impairment Problem Solving: Impaired Problem Solving Impairment: Verbal basic Safety/Judgment: Impaired Sensation Sensation Light Touch: Impaired by gross assessment (difficult to discern, but does not withdraw to pinches at feet) Coordination Gross Motor Movements are Fluid and Coordinated: No Fine Motor Movements are Fluid and Coordinated: Not tested Coordination and Movement Description: tremor in L hand noted while PT perfroms PROM to L UE Motor  Motor Motor - Skilled Clinical Observations: no clonus in the ankles; initial stiffness in B UEs that reduces with repeated PROM so possible tone vs. muscle guarding  Mobility Bed Mobility Bed Mobility: Rolling Right;Rolling Left;Scooting to HOB Rolling Right: 1: +2 Total assist Rolling Right: Patient Percentage: 10% Rolling Right Details: Manual facilitation for placement Rolling Right Details (indicate cue type and reason): pt reaches arm arcross body with verbal cues Rolling Left: 1: +2 Total assist Rolling Left: Patient Percentage: 10% Rolling Left Details: Manual facilitation for placement Rolling Left Details (indicate cue type and reason): pt reaches arm arcross body with verbal cues Scooting to HOB: 1: +2 Total assist;Other (comment) (bed in [place]) Scooting to [facility] Details: Manual  facilitation for placement Scooting to [facility] Details (indicate cue type and reason): Pt does not assist with scoot Transfers Transfers: No (RN reports pt not allowed out of bed until he has an abdominal binder, so she ordered one) Locomotion  Ambulation Ambulation: No Gait Gait: No Stairs / Additional Locomotion Stairs: No Wheelchair Mobility Wheelchair Mobility: No  Trunk/Postural Assessment  Cervical Assessment Cervical Assessment:  (Pt able to lift head slightly so PT could adjust pillow) Thoracic Assessment Thoracic Assessment:  (NT) Lumbar Assessment Lumbar Assessment:  (NT) Postural Control Postural Control:  (NT)  Balance Balance Balance Assessed: No (Pt not to sit up until he has abdominal binder for PEG per RN, so RN ordered one) Extremity Assessment  RUE Assessment RUE Assessment: Exceptions to [facility] RUE PROM (degrees) Overall PROM Right Upper Extremity: Within functional limits for tasks performed RUE Overall PROM Comments: PROM to shoulder, elbow, grip, and wrist wfl RUE Strength RUE Overall Strength: Deficits;Due to impaired cognition RUE Overall Strength Comments: pt attempts to lift R arm upon first command: grossly 2+/5 in shoulder, elbow, and grip.  LUE Assessment LUE Assessment: Exceptions to WFL LUE PROM (degrees) Overall PROM Left Upper Extremity: Within functional limits for tasks assessed LUE Overall PROM Comments: PROM to shoulder, elbow, and grip wfl LUE Strength LUE Overall Strength: Deficits;Due to impaired cognition LUE Overall Strength Comments: Pt attempts to lift L UE on first command: shoulder grossly 2+, elbow grossly 2+, grip 2- RLE Assessment RLE Assessment: Exceptions to [facility] RLE PROM (degrees) Overall PROM Right Lower Extremity: Deficits;Due to premorbid status RLE Overall PROM Comments: B hip flexion limited by abdominal mass, but grossly wfl otherwise RLE Strength RLE Overall Strength: Deficits;Due to impaired cognition RLE Overall  Strength Comments: Pt attempts to move leg on command, but only trace movement noted in thigh and feet LLE Assessment LLE Assessment: Exceptions to WFL LLE PROM (degrees) Overall PROM Left Lower Extremity: Deficits;Due to premorbid status LLE Overall PROM Comments: hip flexion limited by abdominal mass, but otherwise WFL throughout LLE Strength LLE Overall Strength: Deficits;Due to impaired cognition LLE Overall Strength Comments: Pt attempts to move leg on command, but only trace movement noted in thigh and feet  FIM:  FIM - Bed/Chair Transfer Bed/Chair Transfer: 1: Two helpers (2 person to roll R/L and 2 person to scoot up in bed) FIM - Locomotion: Wheelchair Locomotion: Wheelchair: 0: Activity did not occur FIM - Locomotion: Ambulation Locomotion: Ambulation: 0: Activity did not occur FIM - Locomotion: Stairs Locomotion: Stairs: 0: Activity did not occur   Refer to Care Plan for Long Term Goals  Recommendations for other services: None  Discharge Criteria: Patient will be discharged from PT if patient refuses treatment 3 consecutive times without medical reason, if treatment goals not met, if there is a change in medical status, if patient makes no progress towards goals or if patient is discharged from hospital.  The above assessment, treatment plan, treatment alternatives and goals were discussed and mutually agreed upon: No family available/patient unable  [facility] M 07/31/2014, 11:44 AM

## 2014-07-31 NOTE — Progress Notes (Signed)
RT note: RT tracheal suction patient. Moderate thick white secretions. Patient tolerated well vital signs stable.

## 2014-07-31 NOTE — Progress Notes (Signed)
Patient wife at the bedside, explained about our safety plan/protocol and wife verbalized understanding. Also a brief orientation to the unit was given. Continue plan of care.

## 2014-07-31 NOTE — Progress Notes (Signed)
Subjective/Complaints: 53 year old male with h/o DM type 2, CAD, OSA, chronic pain bilateral knees, CVA '11, PFO-on chronic coumadin who was admitted to OSH on 06/13/14 with left basal ganglia hemorrhage with intraventricular extension and hydrocephalus requiring right crani for evacuation as well as VP shunt placement Hospital course complicated by acute respiratory failure requiring tracheostomy as well as PEG placement, BLE DVT requiring IVC placement as well as fevers due to enterobacter PNA. Patient with subsequent right hemiparesis, lethargy, nonverbal, requiring noxious stimuli for arousal, difficulty tracking, dysconjugate gaze as well as difficulty following commands. He was transferred to [facility] on 07/08/14 for vent wean and rehab. He was maintained on meropenum for HCAP and malignant HTN managed with medication changes. Antibiotics completed on 08/10 but patient developed recurrent fever 101 F on 08/16 with purulent drainage from trach. He was pan cultured started on Levaquin for treatment. Stool negative for C diff. UCS without growth. BC X 2 negative so far and pending. Respiratory culture grew moderate enterobacter cloacae with recommendations for 10 day treatment. BLE edema treated with compression wraps. Lurline Idol downsized to CFS # 6 on 08/15  Review of Systems - unable to obtain secondary to mental status Objective: Vital Signs: Blood pressure 126/80, pulse 96, temperature 98.3 F (36.8 C), temperature source Oral, resp. rate 18, weight 126.1 kg (278 lb), SpO2 97.00%. No results found. Results for orders placed during the hospital encounter of 07/30/14 (from the past 72 hour(s))  GLUCOSE, CAPILLARY     Status: None   Collection Time    07/30/14 10:54 PM      Result Value Ref Range   Glucose-Capillary 91  70 - 99 mg/dL  GLUCOSE, CAPILLARY     Status: Abnormal   Collection Time    07/31/14  7:51 AM      Result Value Ref Range   Glucose-Capillary 100 (*) 70 - 99 mg/dL   Comment 1 Notify  RN       HEENT: #6 shiley trach, site looks good Cardio: RRR and no murmur Resp: CTA B/L and unlabored GI: BS positive and PEG site clean and NT Extremity:  No Edema Skin:   Intact and Other Trach and PEG sites clean Neuro: Flat, Abnormal Sensory reduced on left difficult to assess secondary to MS, Abnormal Motor 4- LUE, 3- RLE, 2- LLE, 5/5 RUE, poor cooperation secondary to MS, Dysarthric and Aphasic Musc/Skel:  Normal GEN NAD   Assessment/Plan: 1. Functional deficits secondary to Right Thalamic ICH, Hydrocephalus with shunt which require 3+ hours per day of interdisciplinary therapy in a comprehensive inpatient rehab setting. Physiatrist is providing close team supervision and 24 hour management of active medical problems listed below. Physiatrist and rehab team continue to assess barriers to discharge/monitor patient progress toward functional and medical goals. FIM:                   Comprehension Comprehension Mode: Auditory  Expression Expression: 1-Expresses basis less than 25% of the time/requires cueing greater than 75% of the time.  Social Interaction Social Interaction: 1-Interacts appropriately less than 25% of the time. May be withdrawn or combative.  Problem Solving Problem Solving Mode: Not assessed  Memory Memory Mode: Not assessed Medical Problem List and Plan:  1. Functional deficits secondary to Left thalamic bleed with hydrocephalus.  2. DVT Prophylaxis/Anticoagulation: Pharmaceutical: Lovenox--is now 6 weeks out and should be able to initiate anticoagulation  3. Chronic B-knee/Pain Management: Prn oxycodone. Will add Voltaren gel to knees.  -this patient is WELL  known to me from outpt setting. Had substantial knee pain prior to this event. He was a little too dependent upon his meds for my liking however.  4. Mood: Monitor as mentation improve.  5. Neuropsych: This patient is not capable of making decisions on his own behalf.  6. Skin/Wound  Care: Will need to monitor BLE for signs of breakdown. Continue air mattress overlay. Will get dietician to help with appropriate calorie needs.  7. DM type 2: Will monitor BS with ac/hs checks. Continue Levemir bid and adjust as indicated.  8. Enterobacter tracheobronchitis: Continue Levaquin D # 6/10  7. HTN/CAD: continue Norvasc daily and metoprolol bid.  8. ABLA: on iron supplement. Recheck labs on Monday.    LOS (Days) 1 A FACE TO FACE EVALUATION WAS PERFORMED  KIRSTEINS,ANDREW E 07/31/2014, 9:22 AM

## 2014-07-31 NOTE — Progress Notes (Signed)
CALORIE COUNT  Please hang calorie count envelope on the patient's door. Document percent consumed for each item on the patient's meal tray ticket and keep in envelope. Also document percent of any supplement or snack pt consumes and keep documentation in envelope for RD to review.    Atlee Abide MS RD LDN Clinical Dietitian MBBUY:[phone] Weekend Pager: [phone]

## 2014-07-31 NOTE — Progress Notes (Signed)
Patient arrived to Rehab unit aprox 2025 form [facility] to Room [phone]. Patient was non-interactive and difficult to arouse. RN confirms that this non interaction is the patients norm. No family present at the bedside at this time. Vital signs stable and no indications patient has any pain. Will continue to monitor ans assess patient as needed. Willl follow up with family upon arrival. Continue plan of care. [name], [name]

## 2014-07-31 NOTE — Progress Notes (Signed)
Occupational Therapy Assessment and Plan  Patient Details  Name: Tony Davis MRN: [id number] Date of Birth: 09-26-1961  OT Diagnosis: abnormal posture, cognitive deficits, disturbance of vision and muscle weakness (generalized) Rehab Potential: Rehab Potential: Fair (for stated goals) ELOS: 28-31 days   Today's Date: 07/31/2014 OT Individual Time: 1001-1041 OT Individual Time Calculation (min): 40 min    Problem List:  Patient Active Problem List   Diagnosis Date Noted  . Basal ganglia hemorrhage 07/30/2014  . Syncope due to orthostatic hypotension 04/15/2014  . Medication adverse effect 04/15/2014  . Syncope 04/15/2014  . Routine general medical examination at a health care facility 02/11/2014  . BPH associated with nocturia 02/11/2014  . Gynecomastia 02/11/2014  . Osteoarthritis of left knee 08/21/2013  . Osteoarthritis of right knee 08/21/2013  . Insomnia, persistent 03/25/2013  . History of tobacco use 03/25/2013  . Chronic rhinitis 03/25/2013  . Hemiparesis due to old cerebral infarction 03/20/2013  . DJD (degenerative joint disease) of knee 10/13/2012  . Folate deficiency anemia 10/13/2012  . Long term (current) use of anticoagulants 03/28/2012  . DVT (deep venous thrombosis) 03/19/2012  . Obstructive sleep apnea 08/16/2011  . Type II or unspecified type diabetes mellitus without mention of complication, uncontrolled 08/06/2011  . PATENT FORAMEN OVALE 03/13/2010  . CVA 02/08/2010  . CAD, NATIVE VESSEL 01/26/2010  . Hyperlipidemia LDL goal < 100 01/23/2010  . Essential hypertension, benign 01/23/2010  . GERD 01/23/2010    Past Medical History:  Past Medical History  Diagnosis Date  . GERD (gastroesophageal reflux disease)   . HLD (hyperlipidemia)   . HTN (hypertension)   . Hematuria 8/12  . PFO (patent foramen ovale)     echo 8/12: EF 55-65%, grade 2 diast dysfnx; +PFO on bubble study;  TEE 4/13: EF normal, atrial septum with suspicion for interatrial  septum fenestrations without flow across and few large bubbles noted in LA.  This was not felt to require closure   . CAD (coronary artery disease)     a. s/p promus DES circumflex artery 10/13/10. b. Cath 04/2013: stable disease but possible small vessel disease -Imdur added.  Marland Kitchen DVT (deep venous thrombosis) 2013    "right"   . TIA (transient ischemic attack)     "1-2; both after the one in 02/2010" (09/25/2013)  . OSA on CPAP   . Type II diabetes mellitus     June 2014 AIC 6.2%/notes 09/24/2013; pt denies this hx on 09/25/2013  . Arthritis     "knees" (09/25/2013)  . Rheumatoid arthritis(714.0)   . CVA (cerebral vascular accident) 02/2010    "lost peripheral vision when I had the stroke; some memory issues since" (09/25/2013)   Past Surgical History:  Past Surgical History  Procedure Laterality Date  . Knee arthroscopy Bilateral 2000's    2 on left and 3 on rt  . Tee without cardioversion  03/21/2012    Procedure: TRANSESOPHAGEAL ECHOCARDIOGRAM (TEE);  Surgeon: Fay Records, MD;  Location: [facility] ENDOSCOPY;  Service: Cardiovascular;  Laterality: N/A;  . Coronary angioplasty with stent placement  2012    "1" (09/24/2013)    Assessment & Plan Clinical Impression: Patient is a 53 y.o. year old male with h/o DM type 2, CAD, OSA, chronic pain bilateral knees, CVA '11, PFO-on chronic coumadin who was admitted to OSH on 06/13/14 with left basal ganglia hemorrhage with intraventricular extension and hydrocephalus requiring right crani for evacuation as well as VP shunt placement Hospital course complicated by acute respiratory failure  requiring tracheostomy as well as PEG placement, BLE DVT requiring IVC placement as well as fevers due to enterobacter PNA. Patient with subsequent right hemiparesis, lethargy, nonverbal, requiring noxious stimuli for arousal, difficulty tracking, dysconjugate gaze as well as difficulty following commands. He was transferred to [facility] on 07/08/14 for vent wean and rehab. He  was maintained on meropenum for HCAP and malignant HTN managed with medication changes. Antibiotics completed on 08/10 but patient developed recurrent fever 101 F on 08/16 with purulent drainage from trach. He was pan cultured started on Levaquin for treatment. Stool negative for C diff. UCS without growth. BC X 2 negative so far and pending. Respiratory culture grew moderate enterobacter cloacae with recommendations for 10 day treatment. BLE edema treated with compression wraps. Trach downsized to CFS # 6 on 08/15 and PMSV is able to tolerate PMSV. On dysphagia 2 diet. with thin liquids but poor po intake reported. He   Patient transferred to CIR on 07/30/2014 .    Patient currently requires total with basic self-care skills secondary to muscle weakness, decreased cardiorespiratoy endurance and decreased oxygen support, decreased coordination, decreased vision, decreased problem solving and decreased safety awarene.  Prior to hospitalization, patient could complete ADLs with modified independent .  Patient will benefit from skilled intervention to decrease level of assist/caregiver burden with basic self-care skills prior to discharge home with care partner.  Anticipate patient will require 24/7 supervision and assist and follow up OT services.  OT - End of Session Activity Tolerance: Decreased this session Endurance Deficit: Yes Endurance Deficit Description: Pt very lethargic, unable to keep eyes open during evaluation, follows 1 step verbal commands <10 % of time OT Assessment Rehab Potential: Fair (for stated goals) Barriers to Discharge:  (difficult to assess secondary to caregivers not present during evaluation.) OT Patient demonstrates impairments in the following area(s): Balance;Cognition;Edema;Endurance;Motor;Perception;Safety;Sensory;Vision OT Basic ADL's Functional Problem(s): Eating;Grooming;Bathing;Dressing;Toileting OT Transfers Functional Problem(s): Toilet;Tub/Shower OT Plan OT  Intensity: Minimum of 1-2 x/day, 45 to 90 minutes OT Frequency: 5 out of 7 days OT Duration/Estimated Length of Stay: 28-31 days OT Treatment/Interventions: Balance/vestibular training;Cognitive remediation/compensation;Discharge planning;DME/adaptive equipment instruction;Functional mobility training;Psychosocial support;Therapeutic Activities;UE/LE Strength taining/ROM;Visual/perceptual remediation/compensation;Wheelchair propulsion/positioning;UE/LE Coordination activities;Therapeutic Exercise;Self Care/advanced ADL retraining;Patient/family education;Neuromuscular re-education OT Self Feeding Anticipated Outcome(s): PEG currently - feeding goal when appropriate OT Basic Self-Care Anticipated Outcome(s): Mod A OT Toileting Anticipated Outcome(s): Mod A  OT Bathroom Transfers Anticipated Outcome(s): Mod A OT Recommendation Recommendations for Other Services: Neuropsych consult Patient destination: Home Follow Up Recommendations: 24 hour supervision/assistance Equipment Recommended: To be determined Equipment Details: possibly has a cane or RW   Skilled Therapeutic Intervention OT evaluation initiated and completed this session. OT educated pt on OT purpose, POC, and goals with pt verbalizing understanding. Pt very lethargic this morning and OT having difficult time keeping pt alert for evaluation. Pt supine in bed for bathing and dressing. Ted hose applied. Pt required Total A for ADLs. Total A of 2 for rolling to L and R for LB dressing. PROM to B UEs x 5 reps shoulder, elbow, wrist, and digits in all planes and WFLs. Pt unable to stay awake with difficulty to arouse.   OT Evaluation Precautions/Restrictions  Precautions Precautions: Fall;Other (comment) Precaution Comments: PEG/abdominal precautions, trach, Bilateral thigh high TED hose  Restrictions Weight Bearing Restrictions: No General OT Amount of Missed Time: 20 Minutes Vital Signs Therapy Vitals Temp: 98.9 F (37.2 C) Temp  src: Oral Pulse Rate: 102 Resp: 20 BP: 124/78 mmHg Patient Position (if appropriate): Lying Oxygen Therapy  SpO2: 96 % O2 Device: Trach collar O2 Flow Rate (L/min): 5 L/min FiO2 (%): 28 % Pain Pain Assessment Pain Assessment: No/denies pain Home Living/Prior Functioning Home Living Available Help at Discharge:  (unable to determine secondary to family not present at evaluation) Type of Home: House Home Access: Stairs to enter [employer] of Steps: 2 Entrance Stairs-Rails: Right;Left Home Layout: One level  Lives With: Spouse Prior Function Level of Independence: Independent with basic ADLs;Independent with transfers;Independent with gait;Independent with homemaking with ambulation  Able to Take Stairs?: Yes Driving: Yes Comments: uses RW or cane for ambulation Vision/Perception  Vision- History Patient Visual Report: Peripheral vision impairment Vision- Assessment Vision Assessment?: Vision impaired- to be further tested in functional context Additional Comments: unable to formally assess secondary to pt lethargic. PMH - per chart of peripheral vision impairment.  Cognition Overall Cognitive Status: Impaired/Different from baseline Arousal/Alertness: Lethargic Orientation Level: Oriented to person;Oriented to place Attention: Focused;Sustained Focused Attention: Impaired Focused Attention Impairment: Verbal basic;Functional basic Sustained Attention: Impaired Sustained Attention Impairment: Verbal basic;Functional basic Memory: Impaired Memory Impairment: Other (comment) (difficult to assess secondary to attention deficits and limited responses ) Awareness: Impaired Awareness Impairment: Intellectual impairment Problem Solving: Impaired Problem Solving Impairment: Verbal basic;Functional basic Executive Function: Initiating Initiating: Impaired Initiating Impairment: Verbal basic;Functional basic Safety/Judgment: Impaired Sensation Sensation Light  Touch: Impaired by gross assessment (unable to formally assess. Pt not alert to noxious stimuli) Coordination Gross Motor Movements are Fluid and Coordinated: No Fine Motor Movements are Fluid and Coordinated: No Coordination and Movement Description: edema present in B UEs , pt able to squeeze lightly with B hands Motor  Motor Motor - Skilled Clinical Observations: PROM is WFLs but may have slight increased tone  in B UEs Mobility  Bed Mobility Bed Mobility: Rolling Right;Rolling Left Rolling Right: 1: +2 Total assist Rolling Right: Patient Percentage: 10% Rolling Right Details: Manual facilitation for placement Rolling Left: 1: +2 Total assist Rolling Left: Patient Percentage: 10% Rolling Left Details: Manual facilitation for placement  Trunk/Postural Assessment  Cervical Assessment Cervical Assessment:  (Not tested) Thoracic Assessment Thoracic Assessment:  (Not tested) Lumbar Assessment Lumbar Assessment:  (Not tested) Postural Control Postural Control:  (Not tested)  Balance Balance Balance Assessed: No Extremity/Trunk Assessment RUE Assessment RUE Assessment: Exceptions to [facility] RUE PROM (degrees) Overall PROM Right Upper Extremity: Within functional limits for tasks performed RUE Overall PROM Comments: shoulder, elbow, wrist, and digits. Slightly limited in wrist and digits secondary to edema.  RUE Strength RUE Overall Strength: Deficits;Due to impaired cognition;Other (Comment) (weakness) RUE Overall Strength Comments: Pt attempted to hold wash cloth to wash peri area but unable to remaing contact and forward arm movements for intended area. 2+/5 gross strength  in shoulder, elbow, and grip. Pt able to lightly squeeze hand. LUE Assessment LUE Assessment: Exceptions to WFL LUE PROM (degrees) Overall PROM Left Upper Extremity: Within functional limits for tasks assessed LUE Overall PROM Comments: PROM to shoulder, elbow,wrist, and digits - WFLs LUE Strength LUE Overall  Strength: Deficits;Due to impaired cognition LUE Overall Strength Comments: 2+/5 grossly throughout  FIM:  FIM - Eating Eating Activity: 1: Helper performs IV, parenteral, or tube feeding FIM - Grooming Grooming: 1: Patient completes 0 of 4 or 1 of 5 steps, or requires 2 helpers FIM - Bathing Bathing: 1: Total-Patient completes 0-2 of 10 parts or less than 25% FIM - Upper Body Dressing/Undressing Upper body dressing/undressing: 0: Wears gown/pajamas-no public clothing FIM - Lower Body Dressing/Undressing Lower body dressing/undressing: 1: Total-Patient completed less  than 25% of tasks FIM - Toileting Toileting: 0: Activity did not occur FIM - Air cabin crew Transfers: 0-Activity did not occur FIM - Camera operator Transfers: 0-Activity did not occur or was simulated   Refer to Care Plan for Long Term Goals  Recommendations for other services: Neuropsych  Discharge Criteria: Patient will be discharged from OT if patient refuses treatment 3 consecutive times without medical reason, if treatment goals not met, if there is a change in medical status, if patient makes no progress towards goals or if patient is discharged from hospital.  The above assessment, treatment plan, treatment alternatives and goals were discussed and mutually agreed upon: by patient  [name] 07/31/2014, 5:19 PM

## 2014-07-31 NOTE — Evaluation (Signed)
Speech Language Pathology Assessment and Plan  Patient Details  Name: Tony Davis MRN: [id number] Date of Birth: 12/10/1961  SLP Diagnosis: Cognitive Impairments;Dysphagia;Speech and Language deficits  Rehab Potential: Good ELOS: 2-3 weeks    Today's Date: 07/31/2014 SLP Individual Time: 8119-1478 SLP Individual Time Calculation (min): 60 min   Problem List:  Patient Active Problem List   Diagnosis Date Noted  . Basal ganglia hemorrhage 07/30/2014  . Syncope due to orthostatic hypotension 04/15/2014  . Medication adverse effect 04/15/2014  . Syncope 04/15/2014  . Routine general medical examination at a health care facility 02/11/2014  . BPH associated with nocturia 02/11/2014  . Gynecomastia 02/11/2014  . Osteoarthritis of left knee 08/21/2013  . Osteoarthritis of right knee 08/21/2013  . Insomnia, persistent 03/25/2013  . History of tobacco use 03/25/2013  . Chronic rhinitis 03/25/2013  . Hemiparesis due to old cerebral infarction 03/20/2013  . DJD (degenerative joint disease) of knee 10/13/2012  . Folate deficiency anemia 10/13/2012  . Long term (current) use of anticoagulants 03/28/2012  . DVT (deep venous thrombosis) 03/19/2012  . Obstructive sleep apnea 08/16/2011  . Type II or unspecified type diabetes mellitus without mention of complication, uncontrolled 08/06/2011  . PATENT FORAMEN OVALE 03/13/2010  . CVA 02/08/2010  . CAD, NATIVE VESSEL 01/26/2010  . Hyperlipidemia LDL goal < 100 01/23/2010  . Essential hypertension, benign 01/23/2010  . GERD 01/23/2010   Past Medical History:  Past Medical History  Diagnosis Date  . GERD (gastroesophageal reflux disease)   . HLD (hyperlipidemia)   . HTN (hypertension)   . Hematuria 8/12  . PFO (patent foramen ovale)     echo 8/12: EF 55-65%, grade 2 diast dysfnx; +PFO on bubble study;  TEE 4/13: EF normal, atrial septum with suspicion for interatrial septum fenestrations without flow across and few large bubbles  noted in LA.  This was not felt to require closure   . CAD (coronary artery disease)     a. s/p promus DES circumflex artery 10/13/10. b. Cath 04/2013: stable disease but possible small vessel disease -Imdur added.  Marland Kitchen DVT (deep venous thrombosis) 2013    "right"   . TIA (transient ischemic attack)     "1-2; both after the one in 02/2010" (09/25/2013)  . OSA on CPAP   . Type II diabetes mellitus     June 2014 AIC 6.2%/notes 09/24/2013; pt denies this hx on 09/25/2013  . Arthritis     "knees" (09/25/2013)  . Rheumatoid arthritis(714.0)   . CVA (cerebral vascular accident) 02/2010    "lost peripheral vision when I had the stroke; some memory issues since" (09/25/2013)   Past Surgical History:  Past Surgical History  Procedure Laterality Date  . Knee arthroscopy Bilateral 2000's    2 on left and 3 on rt  . Tee without cardioversion  03/21/2012    Procedure: TRANSESOPHAGEAL ECHOCARDIOGRAM (TEE);  Surgeon: Fay Records, MD;  Location: [facility] ENDOSCOPY;  Service: Cardiovascular;  Laterality: N/A;  . Coronary angioplasty with stent placement  2012    "1" (09/24/2013)    Assessment / Plan / Recommendation Clinical Impression Patient presents with a severe cognitive impairment, moderate-severe expressive and receptive language impairment (receptive better than expressive) and moderate oropharyngeal dysphagia. Cognitive impairment is characterized by fluctuating attention and delays in initiating response or action when requested, poor focused attention, poor orientation, significantly decreased intellectual awareness. Patient is able to follow some basic commands and respond to biographical questions, however he was highly variable in initiation of  responses. At times he would respond immediately, and other times he would not respond at all, and would look at SLP with a confused facial expression. Dysphagia is characterized by decreased awareness of bolus, swallow initiation delay. Patient also reportedlly  has not consumed many PO's and is still getting supplemental TF's at night. Patient is on a #6 cuffless Shiley trach and tolerated PMV for 45 minutes during evaluation, with no significant change in HR or SpO2 saturation. Patient 's deficits severely impact his safety, ability to function at basic level, and ability to safely intake PO's.. He wil require intervention from a skilled speech langauage pathologist to improve.  Skilled Therapeutic Interventions            SLP Assessment  Patient will need skilled Speech Lanaguage Pathology Services during CIR admission    Recommendations  Patient may use Passy-Muir Speech Valve: During all waking hours (remove during sleep) PMSV Supervision: Intermittent Diet Recommendations: Dysphagia 2 (Fine chop);Thin liquid Liquid Administration via: Cup Medication Administration: Crushed with puree (crushed with puree or via PEG) Supervision: Full supervision/cueing for compensatory strategies Compensations: Slow rate;Small sips/bites;Check for pocketing Postural Changes and/or Swallow Maneuvers: Seated upright 90 degrees Oral Care Recommendations: Oral care BID Patient destination: [place] (SNF) (to be determined) Follow up Recommendations: Skilled Nursing facility;Home Health SLP;24 hour supervision/assistance Equipment Recommended: Other (comment) (currently with PMV)    SLP Frequency 5 out of 7 days   SLP Treatment/Interventions Cognitive remediation/compensation;Speech/Language facilitation;Internal/external aids;Patient/family education;Dysphagia/aspiration precaution training;Functional tasks;Cueing hierarchy    Pain Pain Assessment Pain Assessment: No/denies pain Prior Functioning Cognitive/Linguistic Baseline: Information not available (no family present ) Type of Home: House  Lives With: Spouse  Short Term Goals: Week 1: SLP Short Term Goal 1 (Week 1): Patient will initiate verbal response to yes/no and open-ended questions  within 5-10 seconds with moderate verbal/tactile cues. SLP Short Term Goal 2 (Week 1): Patient will tolerate current diet with no overt s/s aspiration. SLP Short Term Goal 3 (Week 1): Patient will tolerate PMV placement for all waking hours with no significant changes in SpO2 saturation. SLP Short Term Goal 4 (Week 1): Patient will follow basic level, 1-step directions with 75% accuracy.  See FIM for current functional status Refer to Care Plan for Long Term Goals  Recommendations for other services: None  Discharge Criteria: Patient will be discharged from SLP if patient refuses treatment 3 consecutive times without medical reason, if treatment goals not met, if there is a change in medical status, if patient makes no progress towards goals or if patient is discharged from hospital.  The above assessment, treatment plan, treatment alternatives and goals were discussed and mutually agreed upon: No family available/patient unable  Dannial Monarch 07/31/2014, 4:43 PM  Sonia Baller, MA, CCC-SLP [facility] Speech-Language Pathologist

## 2014-07-31 NOTE — Interval H&P Note (Signed)
Tony Davis was admitted today to Inpatient Rehabilitation with the diagnosis of left basal ganglia hemorrhage.  The patient's history has been reviewed, patient examined, and there is no change in status.  Patient continues to be appropriate for intensive inpatient rehabilitation.  I have reviewed the patient's chart and labs.  Questions were answered to the patient's satisfaction.  This encounter and document were completed on 07/30/14. The H&P is now being placed in the rehab encounter for the purpose of charting.  Merrell Borsuk T 07/31/2014, 4:27 AM

## 2014-08-01 ENCOUNTER — Inpatient Hospital Stay (HOSPITAL_COMMUNITY): Payer: Medicare Other

## 2014-08-01 LAB — CULTURE, BLOOD (ROUTINE X 2)
CULTURE: NO GROWTH
Culture: NO GROWTH

## 2014-08-01 LAB — GLUCOSE, CAPILLARY
GLUCOSE-CAPILLARY: 90 mg/dL (ref 70–99)
GLUCOSE-CAPILLARY: 133 mg/dL — AB (ref 70–99)
Glucose-Capillary: 83 mg/dL (ref 70–99)
Glucose-Capillary: 132 mg/dL — ABNORMAL HIGH (ref 70–99)

## 2014-08-01 NOTE — Progress Notes (Signed)
Physical Therapy Session Note  Patient Details  Name: Tony Davis MRN: [id number] Date of Birth: October 10, 1961  Today's Date: 08/01/2014 PT Individual Time: 0945-1030 PT Individual Time Calculation (min): 45 min   Short Term Goals: Week 1:  PT Short Term Goal 1 (Week 1): Pt will transfer out of bed to w/c using maxi sky lift PT Short Term Goal 2 (Week 1): Pt will tolerate sitting up in tilt in space w/c for 2 hours. PT Short Term Goal 3 (Week 1): Pt will roll in bed req 1 person assist PT Short Term Goal 4 (Week 1): Pt will tolerate supine/side lie to sit transfer req 2 person assist.  PT Short Term Goal 5 (Week 1): Pt will demonstrate sustained focus of an activity for 30 seconds.   Skilled Therapeutic Interventions/Progress Updates:    Pt received supine in bed, easily aroused w/ min multimodal cues. Session focused on bed mobility, orientation, sustained attention. NT present for 30 minutes of session in order to bathe pt during treatment. Pt rolled L/R w/ +2A for changing sheets, changing brief, and bathing pt. Pt followed commands ~15% of time, able to maintain eyes opened for 50% of session, seemed to be frequently distracted. W/ PMSV pt able to vocalize first name and "hospital", but not oriented beyond that. Performed AROM/AAROM/PROM for all limbs including PNF techniques for increased initiation. Observed increased initiation ~25% of time w/ LE, ~10% w/ UE. Pt left supine in bed w/ PMSV doffed w/ all needs within reach.  Therapy Documentation Precautions:  Precautions Precautions: Fall;Other (comment) Precaution Comments: PEG/abdominal precautions, trach, Bilateral thigh high TED hose  Restrictions Weight Bearing Restrictions: No Vital Signs: Therapy Vitals Temp: 98.2 F (36.8 C) Temp src: Oral Pulse Rate: 98 Resp: 20 BP: 134/82 mmHg Patient Position (if appropriate): Lying Oxygen Therapy SpO2: 96 % O2 Device: Trach collar O2 Flow Rate (L/min): 5 L/min  See FIM for  current functional status  Therapy/Group: Individual Therapy  Rada Hay Rada Hay, PT, DPT 08/01/2014, 8:00 AM

## 2014-08-01 NOTE — Progress Notes (Signed)
Subjective/Complaints: 53 year old male with h/o DM type 2, CAD, OSA, chronic pain bilateral knees, CVA '11, PFO-on chronic coumadin who was admitted to OSH on 06/13/14 with left basal ganglia hemorrhage with intraventricular extension and hydrocephalus requiring right crani for evacuation as well as VP shunt placement Hospital course complicated by acute respiratory failure requiring tracheostomy as well as PEG placement, BLE DVT requiring IVC placement as well as fevers due to enterobacter PNA. Patient with subsequent right hemiparesis, lethargy, nonverbal, requiring noxious stimuli for arousal, difficulty tracking, dysconjugate gaze as well as difficulty following commands. He was transferred to [facility] on 07/08/14 for vent wean and rehab. He was maintained on meropenum for HCAP and malignant HTN managed with medication changes.  Respiratory culture grew moderate enterobacter cloacae with recommendations for 10 day treatment. BLE edema treated with compression wraps. Lurline Idol downsized to CFS # 6 on 08/15  Sleeping this am but arouses easily to voice, says good morning  Review of Systems - unable to obtain secondary to mental status Objective: Vital Signs: Blood pressure 134/82, pulse 95, temperature 98.2 F (36.8 C), temperature source Oral, resp. rate 18, weight 126.1 kg (278 lb), SpO2 95.00%. No results found. Results for orders placed during the hospital encounter of 07/30/14 (from the past 72 hour(s))  GLUCOSE, CAPILLARY     Status: None   Collection Time    07/30/14 10:54 PM      Result Value Ref Range   Glucose-Capillary 91  70 - 99 mg/dL  GLUCOSE, CAPILLARY     Status: Abnormal   Collection Time    07/31/14  7:51 AM      Result Value Ref Range   Glucose-Capillary 100 (*) 70 - 99 mg/dL   Comment 1 Notify RN    GLUCOSE, CAPILLARY     Status: None   Collection Time    07/31/14 11:32 AM      Result Value Ref Range   Glucose-Capillary 92  70 - 99 mg/dL   Comment 1 Notify RN    GLUCOSE,  CAPILLARY     Status: Abnormal   Collection Time    07/31/14  4:58 PM      Result Value Ref Range   Glucose-Capillary 115 (*) 70 - 99 mg/dL   Comment 1 Notify RN    GLUCOSE, CAPILLARY     Status: None   Collection Time    07/31/14  8:35 PM      Result Value Ref Range   Glucose-Capillary 99  70 - 99 mg/dL  GLUCOSE, CAPILLARY     Status: None   Collection Time    08/01/14  7:11 AM      Result Value Ref Range   Glucose-Capillary 83  70 - 99 mg/dL   Comment 1 Notify RN       HEENT: #6 shiley trach, site looks good Cardio: RRR and no murmur Resp: CTA B/L and unlabored GI: BS positive and PEG site clean and NT Extremity:  No Edema Skin:   Intact and Other Trach and PEG site crusted aroung stoma Neuro: Flat, Abnormal Sensory reduced on left difficult to assess secondary to MS, Abnormal Motor 4- LUE, 3- RLE, 2- LLE, 5/5 RUE, poor cooperation secondary to MS, Dysarthric and Aphasic Musc/Skel:  Normal GEN NAD   Assessment/Plan: 1. Functional deficits secondary to left Thalamic ICH, Hydrocephalus with Right  shunt which require 3+ hours per day of interdisciplinary therapy in a comprehensive inpatient rehab setting. Physiatrist is providing close team supervision and 24  hour management of active medical problems listed below. Physiatrist and rehab team continue to assess barriers to discharge/monitor patient progress toward functional and medical goals. Add neurostimulant to improve arousal FIM: FIM - Bathing Bathing: 1: Total-Patient completes 0-2 of 10 parts or less than 25%  FIM - Upper Body Dressing/Undressing Upper body dressing/undressing: 0: Wears gown/pajamas-no public clothing FIM - Lower Body Dressing/Undressing Lower body dressing/undressing: 1: Total-Patient completed less than 25% of tasks  FIM - Toileting Toileting: 0: Activity did not occur  FIM - Air cabin crew Transfers: 0-Activity did not occur  FIM - IT sales professional Transfer: 1: Two  helpers (2 person to roll R/L and 2 person to scoot up in bed)  FIM - Locomotion: Wheelchair Locomotion: Wheelchair: 0: Activity did not occur FIM - Locomotion: Ambulation Locomotion: Ambulation: 0: Activity did not occur  Comprehension Comprehension Mode: Auditory Comprehension: 1-Understands basic less than 25% of the time/requires cueing 75% of the time  Expression Expression Mode: Verbal Expression Assistive Devices: 6-Talk trach valve Expression: 1-Expresses basis less than 25% of the time/requires cueing greater than 75% of the time.  Social Interaction Social Interaction: 1-Interacts appropriately less than 25% of the time. May be withdrawn or combative.  Problem Solving Problem Solving Mode: Not assessed Problem Solving: 1-Solves basic less than 25% of the time - needs direction nearly all the time or does not effectively solve problems and may need a restraint for safety  Memory Memory Mode: Not assessed Memory: 1-Recognizes or recalls less than 25% of the time/requires cueing greater than 75% of the time Medical Problem List and Plan:  1. Functional deficits secondary to Left thalamic bleed with hydrocephalus.  2. DVT Prophylaxis/Anticoagulation: Pharmaceutical: Lovenox--is now 6 weeks out and should be able to initiate anticoagulation  3. Chronic B-knee/Pain Management: Prn oxycodone. Will add Voltaren gel to knees.  -this patient is WELL known to me from outpt setting. Had substantial knee pain prior to this event. He was a little too dependent upon his meds for my liking however.  4. Mood: Monitor as mentation improve.  5. Neuropsych: This patient is not capable of making decisions on his own behalf.  6. Skin/Wound Care: Will need to monitor BLE for signs of breakdown. Continue air mattress overlay. Will get dietician to help with appropriate calorie needs.  7. DM type 2: Will monitor BS with ac/hs checks. Continue Levemir bid and adjust as indicated.  8. Enterobacter  tracheobronchitis: Continue Levaquin D # 6/10  7. HTN/CAD: continue Norvasc daily and metoprolol bid.  8. ABLA: on iron supplement. Recheck labs on Monday.    LOS (Days) 2 A FACE TO FACE EVALUATION WAS PERFORMED  KIRSTEINS,ANDREW E 08/01/2014, 8:57 AM

## 2014-08-02 ENCOUNTER — Inpatient Hospital Stay (HOSPITAL_COMMUNITY): Payer: Medicare Other

## 2014-08-02 ENCOUNTER — Encounter (HOSPITAL_COMMUNITY): Payer: Medicare Other

## 2014-08-02 ENCOUNTER — Inpatient Hospital Stay (HOSPITAL_COMMUNITY): Payer: Medicare Other | Admitting: Speech Pathology

## 2014-08-02 DIAGNOSIS — M1710 Unilateral primary osteoarthritis, unspecified knee: Secondary | ICD-10-CM

## 2014-08-02 DIAGNOSIS — J9600 Acute respiratory failure, unspecified whether with hypoxia or hypercapnia: Secondary | ICD-10-CM

## 2014-08-02 DIAGNOSIS — I10 Essential (primary) hypertension: Secondary | ICD-10-CM

## 2014-08-02 DIAGNOSIS — Z5189 Encounter for other specified aftercare: Secondary | ICD-10-CM

## 2014-08-02 DIAGNOSIS — I619 Nontraumatic intracerebral hemorrhage, unspecified: Secondary | ICD-10-CM

## 2014-08-02 DIAGNOSIS — IMO0002 Reserved for concepts with insufficient information to code with codable children: Secondary | ICD-10-CM

## 2014-08-02 LAB — GLUCOSE, CAPILLARY
Glucose-Capillary: 105 mg/dL — ABNORMAL HIGH (ref 70–99)
Glucose-Capillary: 128 mg/dL — ABNORMAL HIGH (ref 70–99)
Glucose-Capillary: 146 mg/dL — ABNORMAL HIGH (ref 70–99)
Glucose-Capillary: 89 mg/dL (ref 70–99)

## 2014-08-02 LAB — CBC WITH DIFFERENTIAL/PLATELET
BASOS PCT: 0 % (ref 0–1)
Basophils Absolute: 0 10*3/uL (ref 0.0–0.1)
EOS ABS: 0.3 10*3/uL (ref 0.0–0.7)
Eosinophils Relative: 4 % (ref 0–5)
HEMATOCRIT: 30.3 % — AB (ref 39.0–52.0)
Hemoglobin: 9.5 g/dL — ABNORMAL LOW (ref 13.0–17.0)
LYMPHS ABS: 1.5 10*3/uL (ref 0.7–4.0)
Lymphocytes Relative: 24 % (ref 12–46)
MCH: 29.5 pg (ref 26.0–34.0)
MCHC: 31.4 g/dL (ref 30.0–36.0)
MCV: 94.1 fL (ref 78.0–100.0)
MONO ABS: 0.4 10*3/uL (ref 0.1–1.0)
MONOS PCT: 6 % (ref 3–12)
NEUTROS PCT: 66 % (ref 43–77)
Neutro Abs: 4.3 10*3/uL (ref 1.7–7.7)
Platelets: 317 10*3/uL (ref 150–400)
RBC: 3.22 MIL/uL — AB (ref 4.22–5.81)
RDW: 14.3 % (ref 11.5–15.5)
WBC: 6.5 10*3/uL (ref 4.0–10.5)

## 2014-08-02 LAB — COMPREHENSIVE METABOLIC PANEL WITH GFR
ALT: 15 U/L (ref 0–53)
AST: 15 U/L (ref 0–37)
Albumin: 3 g/dL — ABNORMAL LOW (ref 3.5–5.2)
Alkaline Phosphatase: 109 U/L (ref 39–117)
Anion gap: 12 (ref 5–15)
BUN: 12 mg/dL (ref 6–23)
CO2: 30 meq/L (ref 19–32)
Calcium: 9.5 mg/dL (ref 8.4–10.5)
Chloride: 105 meq/L (ref 96–112)
Creatinine, Ser: 0.97 mg/dL (ref 0.50–1.35)
GFR calc Af Amer: >90 mL/min
GFR calc non Af Amer: >90 mL/min
Glucose, Bld: 79 mg/dL (ref 70–99)
Potassium: 3.6 meq/L — ABNORMAL LOW (ref 3.7–5.3)
Sodium: 147 meq/L (ref 137–147)
Total Bilirubin: <0.2 mg/dL — ABNORMAL LOW (ref 0.3–1.2)
Total Protein: 6.7 g/dL (ref 6.0–8.3)

## 2014-08-02 MED ORDER — VITAL 1.5 CAL PO LIQD
450.0000 mL | Freq: Three times a day (TID) | ORAL | Status: DC
  Administered 2014-08-02 – 2014-08-17 (×44): 450 mL
  Filled 2014-08-02 (×54): qty 474

## 2014-08-02 MED ORDER — FERROUS SULFATE 300 (60 FE) MG/5ML PO SYRP
300.0000 mg | ORAL_SOLUTION | Freq: Two times a day (BID) | ORAL | Status: DC
  Administered 2014-08-02 – 2014-08-23 (×41): 300 mg
  Filled 2014-08-02 (×48): qty 5

## 2014-08-02 MED ORDER — PRO-STAT SUGAR FREE PO LIQD
30.0000 mL | Freq: Every day | ORAL | Status: DC
  Administered 2014-08-03 – 2014-08-05 (×3): 30 mL
  Administered 2014-08-06: 10:00:00
  Administered 2014-08-07: 30 mL
  Administered 2014-08-08: 13:00:00
  Administered 2014-08-09 – 2014-08-17 (×9): 30 mL
  Filled 2014-08-02 (×17): qty 30

## 2014-08-02 MED ORDER — FREE WATER
200.0000 mL | Freq: Every day | Status: DC
  Administered 2014-08-02 – 2014-08-19 (×77): 200 mL

## 2014-08-02 NOTE — Progress Notes (Signed)
Physical Therapy Session Note  Patient Details  Name: Tony Davis MRN: [id number] Date of Birth: July 05, 1961  Today's Date: 08/02/2014 PT Individual Time: Treatment Session 1: 1030-1100; Treatment Session 2: 3419-3790 PT Individual Time Calculation (min): Treatment Session 1: 30 min ; Treatment Session 2: 61min  Short Term Goals: Week 1:  PT Short Term Goal 1 (Week 1): Pt will transfer out of bed to w/c using maxi sky lift PT Short Term Goal 2 (Week 1): Pt will tolerate sitting up in tilt in space w/c for 2 hours. PT Short Term Goal 3 (Week 1): Pt will roll in bed req 1 person assist PT Short Term Goal 4 (Week 1): Pt will tolerate supine/side lie to sit transfer req 2 person assist.  PT Short Term Goal 5 (Week 1): Pt will demonstrate sustained focus of an activity for 30 seconds.   Skilled Therapeutic Interventions/Progress Updates:  Treatment Session 1:  1:1. Pt received semi-reclined in bed, acknowledged therapist with reciprocated hand shake and verbalized "yes" to being ready for therapy. Focus this session on focused>sustained attention, initiation, following one step commands and bed mobility. Pt attempting to initiate moving EOB w/ use of hospital bed functions, req Ax2 for completion. At 50% completion of task, pt stating "I don't think I can sit at the edge of the bed." However, unable to clarify. Pt req close(S)-mod A to maintain sitting balance EOB due to progressive posterior lean. Upon sitting up, noted that pt was incontinent of bladder. Pt req Ax2 persons for t/f sit>sup. Pt req max multimodal cues to maintain alertness once supine w/ HOH assist to initiate B rollingx2, however, req total Ax2 persons for completion. Pt unable to sustain EO in supine as well as when returned to semi-reclined position at end of session. Pt did not acknowledge therapists exit despite max multimodal cues, left w/ all needs in reach, bed alarm on. SaO2 99-100% on 5L trach w/ PMSV in place throughout  session. RN aware.   Treatment Session 2:  1:1. Pt received semi-reclined in bed, verbalized being ready for therapy. Focus this session on orientation, focused>sustained attention, initiation, following simple step commands and functional transfers. Pt req total A for orientation throughout session. Pt initiating t/f sup>sit EOB w/ HOB flat and use of bed rails, req mod A for completion and initial ability to achieve sitting balance but able to maintain w/ close(S). Pt attempting to initiate SBT w/ max multimodal cues to sustain attention to task, but ultimately req Ax2persons for completion bed>w/c<>tx mat. Pt performed t/f sit<>stand x2 w/ Ax2 persons, demonstrating overall flexed posture despite multimodal cues and facilitation. Pt able to maintain standing 20seconds 1x and 10seconds 2x. Pt left sitting in tilt in space w/c at end of session w/ all needs in reach. Nurse tech in room and stated that she would place quick release belt on pt. SaO2 99-100% on 5L trach w/ PMSV in place throughout session.   Therapy Documentation Precautions:  Precautions Precautions: Fall;Other (comment) Precaution Comments: PEG/abdominal precautions, trach, Bilateral thigh high TED hose  Restrictions Weight Bearing Restrictions: No Vital Signs: Therapy Vitals Pulse Rate: 93 Resp: 16 Patient Position (if appropriate): Lying Oxygen Therapy SpO2: 100 % O2 Device: Trach collar O2 Flow Rate (L/min): 5 L/min FiO2 (%): 28 % Pain: Pain Assessment Pain Assessment: No/denies pain  See FIM for current functional status  Therapy/Group: Individual Therapy  Gilmore Laroche 08/02/2014, 11:57 AM

## 2014-08-02 NOTE — Progress Notes (Signed)
Meredith Staggers, MD Physician Signed Physical Medicine and Rehabilitation PMR Pre-admission Service date: 07/30/2014 12:27 PM  Related encounter: Admission (Discharged) from 07/08/2014 in [place]  PMR Admission Coordinator Pre-Admission Assessment  Patient: Tony Davis is an 53 y.o., male  MRN: [id number]  DOB: 07-Nov-1961  Height: '5\' 11"'  (180.3 cm)  Weight: 124.739 kg (275 lb)  Insurance Information  PRIMARY: Medicare A & B Policy#: [id number] a Subscriber: self  Pre-Cert#: verified in [place] Employer: not working  [employer] Date: A: 08-10-12; B: 11-09-13 Deduct: $1260 Out of Pocket Max: none Life Max: unlimited  CIR: 100% SNF: 100% days 1-20; 80% days 21-100 (100 day visit max)  Outpatient: 80% Co-Pay: 20%  Home Health: 100% Co-Pay: none, no visit limits  DME: 80% Co-Pay: 20%  Providers: pt's preference.  Note: pt came to [facility] on 07-08-14 with 35 full inpatient days left. Pt has used 22 full inpatient days while at [facility] and has 13 full inpatient days left. Pt has 30 full inpatient copay days ($315 copay/day). Wife is aware.  Note: Pt's wife is considering placing pt on her [place] from her work and has started that initial process.  Emergency Contact Information    Contact Information     Name  Relation  Home  Work  Mobile     Tiznado,Valencia  Spouse  [phone]   [phone]     DARONTE, SHOSTAK    [phone]       Current Medical History  Patient Admitting Diagnosis: deconditioning following basal ganglia hemorrhage/VP shunt and respiratory failure  History of Present Illness: [name] is a 53 year old male with h/o DM type 2, CAD, OSA, chronic pain bilateral knees, CVA '11, PFO-on chronic coumadin who was admitted to OSH on 06/13/14 with left basal ganglia hemorrhage with intraventricular extension and hydrocephalus requiring VP shunt placement Hospital course complicated by acute respiratory failure  requiring tracheostomy as well as PEG placement, BLE DVT requiring IVC placement as well as fevers due to enterobacter PNA. Patient with subsequent right hemiparesis, lethargy, nonverbal, requiring noxious stimuli for arousal, difficulty tracking, dysconjugate gaze as well as difficulty following commands. He was transferred to [facility] on 07/08/14 for vent wean and rehab. He was maintained on meropenum for HCAP and malignant HTN managed with medication changes. Antibiotics completed on 08/10 but patient developed recurrent fever 101 F on 08/16 with purulent drainage from trach. He was pan cultured started on Levaquin for treatment. Stool negative for C diff. UCS without growth. BC X 2 negative so far and pending. Respiratory culture grew moderate enterobacter cloacae with recommendations for 10 day treatment. BLE edema treated with compression wraps. Trach downsized to CFS # 6 on 08/15 and PMSV is able to tolerate PMSV. On dysphagia 2 diet with thin liquids but poor po intake reported. He continues on supplemental TF at nights and diarrhea resolved. Therapy ongoing with has had improvement in mentation as well as activity level. CIR recommended by Rehab and MD. He is admitted today for comprehensive inpatient rehab program.  Patient's medical record from [facility] has been reviewed by the rehabilitation admission coordinator and physician.  Past Medical History    Past Medical History    Diagnosis  Date    .  GERD (gastroesophageal reflux disease)     .  HLD (hyperlipidemia)     .  HTN (hypertension)     .  Hematuria  8/12    .  PFO (patent foramen ovale)       echo 8/12: EF 55-65%, grade 2 diast dysfnx; +PFO on bubble study; TEE 4/13: EF normal, atrial septum with suspicion for interatrial septum fenestrations without flow across and few large bubbles noted in LA. This was not felt to require closure    .  CAD (coronary artery disease)       a. s/p promus DES circumflex artery 10/13/10. b.  Cath 04/2013: stable disease but possible small vessel disease -Imdur added.    Marland Kitchen  DVT (deep venous thrombosis)  2013      "right"    .  TIA (transient ischemic attack)       "1-2; both after the one in 02/2010" (09/25/2013)    .  OSA on CPAP     .  Type II diabetes mellitus       June 2014 AIC 6.2%/notes 09/24/2013; pt denies this hx on 09/25/2013    .  Arthritis       "knees" (09/25/2013)    .  Rheumatoid arthritis(714.0)     .  CVA (cerebral vascular accident)  02/2010      "lost peripheral vision when I had the stroke; some memory issues since" (09/25/2013)    Family History  family history is negative for Cancer, Kidney disease, and Diabetes.  Prior Rehab/Hospitalizations: Pt has had two prior CVA's (in 2011 and 2013) and pt has had outpatient rehab.  Current Medications  See MAR  Patients Current Diet: Dys 2, with PMV, thin liquids with full assist (Note: PEG feedings done nightly from 7 pm to 7 am)  Precautions / Restrictions  Precautions  Precautions: Fall  Prior Activity Level  Community (5-7x/wk): (Pt used a cane or rollator at times due to knee OA per previous Acupuncturist)  Home Assistive Devices / [place] Devices/Equipment: Cane (specify quad or straight);Walker (specify type)      Prior Functional Level  Current Functional Level     Bed Mobility  Independent  Max assist (sit EOB at minimal to CGA with L trunk lean at times)     Transfers  Independent  Total assist (total assist x 2 stand pivot transfer to chair)     Mobility - Walk/Wheelchair  Independent (sometimes used cane or rolling walker for ambulation)  Other (not assessed at this time)     Upper Body Dressing  Independent  Other (not assessed at this time)     Lower Body Dressing  Independent  Other (not assessed at this time)     Grooming  Independent  Other (set up assist with mouthcare and to wash his mouth)     Eating/Drinking  Independent  Other (self feeding with mod assist, motor  planning issues)     Toilet Transfer  Independent  Other (not assessed at this time)     Bladder Continence  [facility]  incontinent     Bowel Management  WFL  incontinent     Stair Climbing  Independent  Other (not assessed at this time)     Communication  WFL  speaking limited words in a soft voice     Memory  WFL  unable to assess at this time     Cooking/Meal Prep  Independent      Housework  Independent      Money Management  Independent      Driving  Independent      Special needs/care consideration  BiPAP/CPAP no  CPM no  Continuous Drip IV no  Dialysis no  Life Vest no  Oxygen - currently on trach collar with humidified air  Special Bed no  Trach Size - Shiley size 8, using PMV  Wound Vac (area) no  Skin - healed incisions from staples along R head, RN states pt's bottom is red  Bowel mgmt: incontinent  Bladder mgmt: incontinent  Diabetic mgmt - per wife, pt was not diabetic prior to getting sick, but "he is now"  PEG: night feedings from 7 pm to 7 am and pt has a dys 2 diet during the day (see above)  Pt with R UE peripheral IV  Previous Home Environment  Living Arrangements: Spouse/significant other;Children  Lives With: Spouse  Available Help at Discharge: (Wife will be working (typical day schedule) but pt's mother from [place] and father-in-law plan to alternate days to care for pt while wife is at work. Pt's cousin is also available to help. Neighbors have also volunteered to help.)  Type of Home: House  Home Layout: One level  Home Access: Stairs to enter  Entrance Stairs-Rails: Nurse, mental health of Steps: 2  Bathroom Shower/Tub: Administrator, Civil Service: Standard  Discharge Living Setting  Plans for Discharge Living Setting: Patient's home  Type of Home at Discharge: House  Discharge Home Layout: One level  Discharge Home Access: Stairs to enter  Entrance Stairs-Rails: Nurse, mental health of Steps: 2  Discharge Bathroom  Shower/Tub: Tub/shower unit  Discharge Bathroom Toilet: Standard  [employer]  Patient Roles: Spouse  Contact Information: wife Richardson Chiquito is primary contact  Anticipated Caregiver: pt's mother and father-in-law will be alternating days while wife is at work  Anticipated Ambulance person Information: see above  Ability/Limitations of Caregiver: wife will be working during day. Pt's mother and father-in-law will be alternating caring for pt during day. Wife will be primary caregiver in evenings. Pt's cousin also available to help and neighbors have volunteered.  Caregiver Availability: 24/7  Discharge Plan Discussed with Primary Caregiver: Yes (discussed with pt's wife by phone on 8-20 and 8-21)  Is Caregiver In Agreement with Plan?: Yes  Does Caregiver/Family have Issues with Lodging/Transportation while Pt is in Rehab?: No  Goals/Additional Needs  Patient/Family Goal for Rehab: minimal assistance with PT, OT and SLP  Expected length of stay: 14-18 days  Cultural Considerations: Christian  Dietary Needs: Dys 2 with PMV, thin liquids with full assist. PEG feedings nightly. See above comments.  Equipment Needs: to be determined  Pt/Family Agrees to Admission and willing to participate: Yes  Program Orientation Provided & Reviewed with Pt/Caregiver Including Roles & Responsibilities: Yes  Patient Condition: I met with pt at [facility] on 07-29-14 and spoke with pt's wife by phone to explain the possibility of our acute inpatient rehab program. This 53 year old patient was visiting family in [place] when he started to have a headache and mental status changes. He was admitted to OSH in [place] on 06/13/14 with left basal ganglia hemorrhage with intraventricular extension and hydrocephalus requiring ventricular drain. Hospital course complicated by acute respiratory failure requiring tracheostomy as well as PEG placement, BLE DVT requiring IVC placement as well as  fevers due to PNA. Pt is currently requiring max assist with bed mobility and can sit on EOB with minimal to contact guard assist. He is needed total assist of 2 staff for stand pivot transfers to a chair. Pt's self care skills are also limited and he needs moderate assist for mouth care  skills/washing his face. Pt has a speaking valve with his trach and is on a dysphagia 2 diet with thin liquids (full assist) in addition to PEG tube feedings. Pt will benefit from skilled speech services to progress his diet needs and help identify cognitive issues now that he is becoming more verbal with speaking valve. He will benefit greatly from the multi-disciplinary team of skilled PT, OT and rehab nursing to maximize his functional return following this basal ganglia hemorrhage and respiratory failure. PT, OT and rehab nursing will focus on increasing strength for greater independence in bed mobility, transfers, mobility and self-care skills. Rehab nursing will also focus on pt/family education in regards to medication management and bladder/bowel issues. In addition, pt will benefit from rehab physician intervention to monitor his HTN, diabetes and respiratory status in light of hemorrhage. Discussed case with Dr. Naaman Plummer and rehab PA who stated that pt is a good inpatient rehab candidate. We received medical clearance from Dr. Laren Everts at [facility]. Pt's wife is in support of this next transition to acute rehab and motivated to have her husband return home with family assist. He will benefit from the intensive services of skilled therapy under rehab physician guidance and will be admitted today on 07-30-14.  Preadmission Screen Completed By: Nanetta Batty, PT, 07/30/2014 10:14 AM  ______________________________________________________________________  Discussed status with Dr. Naaman Plummer on 07-30-14 at 1014 and received telephone approval for admission today.  Admission Coordinator: Nanetta Batty, PT, time 1014/Date  07-30-14  Assessment/Plan:  Diagnosis: left BG ICH and multiple medical sequelae  1. Does the need for close, 24 hr/day Medical supervision in concert with the patient's rehab needs make it unreasonable for this patient to be served in a less intensive setting? Yes  2. Co-Morbidities requiring supervision/potential complications: respiratory failure, dysphagia 3. Due to bladder management, bowel management, skin/wound care, disease management and medication administration, does the patient require 24 hr/day rehab nursing? Yes 4. Does the patient require coordinated care of a physician, rehab nurse, PT (1-2 hrs/day, 5 days/week), OT (1-2 hrs/day, 5 days/week) and SLP (1-2 hrs/day, 5 days/week) to address physical and functional deficits in the context of the above medical diagnosis(es)? Yes Addressing deficits in the following areas: balance, endurance, locomotion, strength, transferring, bowel/bladder control, bathing, dressing, feeding, grooming, toileting, cognition, speech, language, swallowing and psychosocial support 5. Can the patient actively participate in an intensive therapy program of at least 3 hrs of therapy 5 days a week? Yes 6. The potential for patient to make measurable gains while on inpatient rehab is excellent 7. Anticipated functional outcomes upon discharge from inpatients are: min assist PT, min assist OT, min assist SLP 8. Estimated rehab length of stay to reach the above functional goals is: 14-18 days 9. Does the patient have adequate social supports to accommodate these discharge functional goals? Yes 10. Anticipated D/C setting: Home 11. Anticipated post D/C treatments: [place] therapy 12. Overall Rehab/Functional Prognosis: excellent RECOMMENDATIONS:  This patient's condition is appropriate for continued rehabilitative care in the following setting: CIR  Patient has agreed to participate in recommended program. Yes  Note that insurance prior authorization may be required for  reimbursement for recommended care.  Comment: Admit to inpatient rehab today  Meredith Staggers, MD, [place], [place]  07/30/2014

## 2014-08-02 NOTE — Progress Notes (Signed)
Subjective/Complaints: 53 year old male with h/o DM type 2, CAD, OSA, chronic pain bilateral knees, CVA '11, PFO-on chronic coumadin who was admitted to OSH on 06/13/14 with left basal ganglia hemorrhage with intraventricular extension and hydrocephalus requiring right crani for evacuation as well as VP shunt placement Hospital course complicated by acute respiratory failure requiring tracheostomy as well as PEG placement, BLE DVT requiring IVC placement as well as fevers due to enterobacter PNA. Patient with subsequent right hemiparesis, lethargy, nonverbal, requiring noxious stimuli for arousal, difficulty tracking, dysconjugate gaze as well as difficulty following commands. He was transferred to [facility] on 07/08/14 for vent wean and rehab. He was maintained on meropenum for HCAP and malignant HTN managed with medication changes.  Respiratory culture grew moderate enterobacter cloacae with recommendations for 10 day treatment. BLE edema treated with compression wraps. Lurline Idol downsized to CFS # 6 on 08/15  Getting cleaned up from incont bowel, no new issues noted per staff OT states pt was oriented to place this am but currently unaable to give info other than name  Review of Systems - unable to obtain secondary to mental status Objective: Vital Signs: Blood pressure 132/87, pulse 103, temperature 100 F (37.8 C), temperature source Oral, resp. rate 22, weight 126.1 kg (278 lb), SpO2 97.00%. No results found. Results for orders placed during the hospital encounter of 07/30/14 (from the past 72 hour(s))  GLUCOSE, CAPILLARY     Status: None   Collection Time    07/30/14 10:54 PM      Result Value Ref Range   Glucose-Capillary 91  70 - 99 mg/dL  GLUCOSE, CAPILLARY     Status: Abnormal   Collection Time    07/31/14  7:51 AM      Result Value Ref Range   Glucose-Capillary 100 (*) 70 - 99 mg/dL   Comment 1 Notify RN    GLUCOSE, CAPILLARY     Status: None   Collection Time    07/31/14 11:32 AM   Result Value Ref Range   Glucose-Capillary 92  70 - 99 mg/dL   Comment 1 Notify RN    GLUCOSE, CAPILLARY     Status: Abnormal   Collection Time    07/31/14  4:58 PM      Result Value Ref Range   Glucose-Capillary 115 (*) 70 - 99 mg/dL   Comment 1 Notify RN    GLUCOSE, CAPILLARY     Status: None   Collection Time    07/31/14  8:35 PM      Result Value Ref Range   Glucose-Capillary 99  70 - 99 mg/dL  GLUCOSE, CAPILLARY     Status: None   Collection Time    08/01/14  7:11 AM      Result Value Ref Range   Glucose-Capillary 83  70 - 99 mg/dL   Comment 1 Notify RN    GLUCOSE, CAPILLARY     Status: None   Collection Time    08/01/14 12:07 PM      Result Value Ref Range   Glucose-Capillary 90  70 - 99 mg/dL   Comment 1 Notify RN    GLUCOSE, CAPILLARY     Status: Abnormal   Collection Time    08/01/14  5:06 PM      Result Value Ref Range   Glucose-Capillary 133 (*) 70 - 99 mg/dL   Comment 1 Notify RN    GLUCOSE, CAPILLARY     Status: Abnormal   Collection Time  08/01/14  9:02 PM      Result Value Ref Range   Glucose-Capillary 132 (*) 70 - 99 mg/dL  CBC WITH DIFFERENTIAL     Status: Abnormal   Collection Time    08/02/14  6:35 AM      Result Value Ref Range   WBC 6.5  4.0 - 10.5 K/uL   RBC 3.22 (*) 4.22 - 5.81 MIL/uL   Hemoglobin 9.5 (*) 13.0 - 17.0 g/dL   HCT 30.3 (*) 39.0 - 52.0 %   MCV 94.1  78.0 - 100.0 fL   MCH 29.5  26.0 - 34.0 pg   MCHC 31.4  30.0 - 36.0 g/dL   RDW 14.3  11.5 - 15.5 %   Platelets 317  150 - 400 K/uL   Neutrophils Relative % 66  43 - 77 %   Neutro Abs 4.3  1.7 - 7.7 K/uL   Lymphocytes Relative 24  12 - 46 %   Lymphs Abs 1.5  0.7 - 4.0 K/uL   Monocytes Relative 6  3 - 12 %   Monocytes Absolute 0.4  0.1 - 1.0 K/uL   Eosinophils Relative 4  0 - 5 %   Eosinophils Absolute 0.3  0.0 - 0.7 K/uL   Basophils Relative 0  0 - 1 %   Basophils Absolute 0.0  0.0 - 0.1 K/uL  GLUCOSE, CAPILLARY     Status: None   Collection Time    08/02/14  6:56 AM       Result Value Ref Range   Glucose-Capillary 89  70 - 99 mg/dL     HEENT: #6 shiley trach, site looks good Cardio: RRR and no murmur Resp: CTA B/L and unlabored GI: BS positive and PEG site clean and NT Extremity:  No Edema Skin:   Intact and Other Trach and PEG site crusted aroung stoma Neuro: Flat, Abnormal Sensory reduced on left difficult to assess secondary to MS, Abnormal Motor 4- LUE, 3- RLE, 2- LLE, 5/5 RUE, poor cooperation secondary to MS, Dysarthric and Aphasic Musc/Skel:  Normal GEN NAD   Assessment/Plan: 1. Functional deficits secondary to left Thalamic ICH, Hydrocephalus with Right  shunt which require 3+ hours per day of interdisciplinary therapy in a comprehensive inpatient rehab setting. Physiatrist is providing close team supervision and 24 hour management of active medical problems listed below. Physiatrist and rehab team continue to assess barriers to discharge/monitor patient progress toward functional and medical goals.  FIM: FIM - Bathing Bathing: 1: Total-Patient completes 0-2 of 10 parts or less than 25%  FIM - Upper Body Dressing/Undressing Upper body dressing/undressing: 1: Total-Patient completed less than 25% of tasks FIM - Lower Body Dressing/Undressing Lower body dressing/undressing: 1: Two helpers  FIM - Toileting Toileting: 0: No continent bowel/bladder events this shift  FIM - Air cabin crew Transfers: 0-Activity did not occur  FIM - IT sales professional Transfer: 0: Activity did not occur  FIM - Locomotion: Wheelchair Locomotion: Wheelchair: 0: Activity did not occur FIM - Locomotion: Ambulation Locomotion: Ambulation: 0: Activity did not occur  Comprehension Comprehension Mode: Auditory Comprehension: 1-Understands basic less than 25% of the time/requires cueing 75% of the time  Expression Expression Mode: Verbal Expression Assistive Devices: 6-Talk trach valve Expression: 6-Expresses complex ideas: With extra  time/assistive device  Social Interaction Social Interaction: 1-Interacts appropriately less than 25% of the time. May be withdrawn or combative.  Problem Solving Problem Solving Mode: Not assessed Problem Solving: 1-Solves basic less than 25% of the time - needs  direction nearly all the time or does not effectively solve problems and may need a restraint for safety  Memory Memory Mode: Not assessed Memory: 1-Recognizes or recalls less than 25% of the time/requires cueing greater than 75% of the time Medical Problem List and Plan:  1. Functional deficits secondary to Left thalamic bleed with hydrocephalus.  2. DVT Prophylaxis/Anticoagulation: Pharmaceutical: Lovenox--is now 6 weeks out and should be able to initiate anticoagulation  3. Chronic B-knee/Pain Management: Prn oxycodone. Will add Voltaren gel to knees.  -this patient is WELL known to me from outpt setting. Had substantial knee pain prior to this event. He was a little too dependent upon his meds for my liking however.  4. Mood: Monitor as mentation improve.  5. Neuropsych: This patient is not capable of making decisions on his own behalf.  6. Skin/Wound Care: Will need to monitor BLE for signs of breakdown. Continue air mattress overlay. Will get dietician to help with appropriate calorie needs.  7. DM type 2: Will monitor BS with ac/hs checks. Continue Levemir bid and adjust as indicated.  8. Enterobacter tracheobronchitis: Continue Levaquin D # 6/10  7. HTN/CAD: continue Norvasc daily and metoprolol bid.  8. ABLA: on iron supplement. Recheck labs on Monday.  9.  Low grade fever check UA, may be atelectasis  LOS (Days) 3 A FACE TO FACE EVALUATION WAS PERFORMED  Tony Davis E 08/02/2014, 8:33 AM

## 2014-08-02 NOTE — Progress Notes (Signed)
NUTRITION FOLLOW-UP  DOCUMENTATION CODES Per approved criteria  -Obesity Unspecified   INTERVENTION: -Discontinue calorie count.  -Increase bolus tube feeding of Vital 1.5 to 450 ml TID and provide 30 ml Prostat once daily per PEG to provide 2125 kcal, 106 grams of protein, and 1026 ml of H2O.   -Provide free water flushes of 200 ml 5 times daily.  -Tube feeding regimen provides 100% of estimated nutrition needs.  -Will continue to monitor  NUTRITION DIAGNOSIS: Inadequate oral intake related to decreased appetite as evidenced by PO intake < 75%; not resolved.  NEW NUTRITION Dx: Inadequate oral intake related to inability to eat as evidenced by NPO status.  Goal: Pt to meet >/= 90% of their estimated nutrition needs, not met  Monitor:  TF tolerance, labs, weights, I/O's  53 y.o. male  Admitting Dx: Basal Ganglia Hemorrhage  ASSESSMENT: Tony Davis is a 53 year old male with h/o DM type 2, CAD, OSA, chronic pain bilateral knees, CVA '11, PFO-on chronic coumadin who was admitted to OSH on 06/13/14 with left basal ganglia hemorrhage with intraventricular extension and hydrocephalus requiring right crani for evacuation as well as VP shunt placement Hospital course complicated by acute respiratory failure requiring tracheostomy as well as PEG placement, BLE DVT requiring IVC placement as well as fevers due to enterobacter PNA. Patient with subsequent right hemiparesis, lethargy, nonverbal, requiring noxious stimuli for arousal, difficulty tracking, dysconjugate gaze as well as difficulty following commands  8/22-Pt endorsed decreased appetite, however was unable to determine when decreased PO intake began -Is currently receiving feeding assistance, RD viewed breakfast tray with <25% completion. Pt had only consumed <50% of MagicCup protein supplement -Weight appears to have maintained stable per previous medical records, fluctuates between 270-278 lbs in past 3 months (2.8% body  weight, non-severe for time frame) -Receiving Vital 1.5 at nocturnal feeds of 6pm-6am at 65 ml/hr w/Pro-stat TID. MD requesting advancing to bolus feeds until PO intake improves -Modified regimen to Vital 1.5 at 250 ml TID between meals  8/24- Calorie Count: Dysphagia 2 with thin -72 hour calorie count ordered, however pt NPO starting today for full bolus tube feeding as per SLP note, pt demonstrated severely impaired attention and initiation which resulted in intermittent throat clears following sips of thin liquids and coughs following puree boluses. -8/22: Breakfast: no information provided Lunch: pt refused food  Dinner: no information provided Supplements: 50% magic cup: 145 kcals, 4.5 grams of protein -8/23: No information provided at meals. -Total intake: 145 kcal (7% of minimum estimated needs)  4.5 grams of protein (4.5% of minimum estimated needs)  Will discontinue calorie count.  Dietitian was notified for initiation of full bolus tube feedings. Spoke with RN, pt has been tolerating the bolus tube feeding. Spoke with pt, pt reports having stomach pains, which had started yesterday.   Current ordered bolus tube feeding:Vital 1.5 @ 250 ml TID with 30 ml Prostat via PEG TID.  -Tube feeding regimen provides 1425 kcal (70% of needs), 96 grams of protein (96% est protein needs), and 573 ml of H2O.   Will increase orders to Vital 1.5 to 450 ml TID and provide 30 ml Prostat per PEG once daily to provide 2125 kcal, 106 grams of protein, and 1026 ml of H2O.   Tube feeding regimen provides 100% of estimated nutrition needs.  Height: Ht Readings from Last 1 Encounters:  07/30/14 5' 11" (1.803 m)    Weight: Wt Readings from Last 1 Encounters:  07/30/14 278 lb (126.1  kg)   BMI:  Body mass index is 38.79 kg/(m^2). Class II obesity  Re-Estimated Nutritional Needs: Kcal: 2000-2200 Protein: 100-110 gram Fluid: >/=2000 ml/daily  Skin: WDL, + 1 gen edema  Diet Order:  NPO   Intake/Output Summary (Last 24 hours) at 08/02/14 1504 Last data filed at 08/01/14 1800  Gross per 24 hour  Intake    240 ml  Output    145 ml  Net     95 ml    Last BM: 8/24  Labs:   Recent Labs Lab 07/27/14 0709 08/02/14 0635  NA 143 147  K 4.8 3.6*  CL 101 105  CO2 26 30  BUN 14 12  CREATININE 0.86 0.97  CALCIUM 9.5 9.5  GLUCOSE 111* 79    CBG (last 3)   Recent Labs  08/01/14 2102 08/02/14 0656 08/02/14 1119  GLUCAP 132* 89 105*    Scheduled Meds: . amLODipine  5 mg Oral Daily  . antiseptic oral rinse  7 mL Mouth Rinse q12n4p  . atorvastatin  20 mg Oral q1800  . chlorhexidine  15 mL Mouth Rinse BID  . diclofenac sodium  2 g Topical QID  . enoxaparin (LOVENOX) injection  40 mg Subcutaneous Q24H  . famotidine  20 mg Oral Daily  . feeding supplement (PRO-STAT SUGAR FREE 64)  30 mL Oral TID WC  . feeding supplement (VITAL 1.5 CAL)  250 mL Per Tube TID BM  . ferrous sulfate  300 mg Per Tube BID WC  . insulin aspart  0-15 Units Subcutaneous TID WC  . insulin aspart  0-5 Units Subcutaneous QHS  . insulin detemir  55 Units Subcutaneous BID  . levofloxacin  750 mg Oral Daily  . metoprolol tartrate  75 mg Oral BID  . modafinil  200 mg Oral Daily  . saccharomyces boulardii  250 mg Oral BID    Continuous Infusions:   Past Medical History  Diagnosis Date  . GERD (gastroesophageal reflux disease)   . HLD (hyperlipidemia)   . HTN (hypertension)   . Hematuria 8/12  . PFO (patent foramen ovale)     echo 8/12: EF 55-65%, grade 2 diast dysfnx; +PFO on bubble study;  TEE 4/13: EF normal, atrial septum with suspicion for interatrial septum fenestrations without flow across and few large bubbles noted in LA.  This was not felt to require closure   . CAD (coronary artery disease)     a. s/p promus DES circumflex artery 10/13/10. b. Cath 04/2013: stable disease but possible small vessel disease -Imdur added.  Marland Kitchen DVT (deep venous thrombosis) 2013    "right"   .  TIA (transient ischemic attack)     "1-2; both after the one in 02/2010" (09/25/2013)  . OSA on CPAP   . Type II diabetes mellitus     June 2014 AIC 6.2%/notes 09/24/2013; pt denies this hx on 09/25/2013  . Arthritis     "knees" (09/25/2013)  . Rheumatoid arthritis(714.0)   . CVA (cerebral vascular accident) 02/2010    "lost peripheral vision when I had the stroke; some memory issues since" (09/25/2013)    Past Surgical History  Procedure Laterality Date  . Knee arthroscopy Bilateral 2000's    2 on left and 3 on rt  . Tee without cardioversion  03/21/2012    Procedure: TRANSESOPHAGEAL ECHOCARDIOGRAM (TEE);  Surgeon: Fay Records, MD;  Location: [facility] ENDOSCOPY;  Service: Cardiovascular;  Laterality: N/A;  . Coronary angioplasty with stent placement  2012    "  1" (09/24/2013)   Kallie Locks, MS, Provisional LDN Pager # [phone] After hours/ weekend pager # [phone]

## 2014-08-02 NOTE — Progress Notes (Signed)
Agree with intervention. Pryor Ochoa RD, LDN Pager: [phone] After Hours Pager: [phone]

## 2014-08-02 NOTE — Progress Notes (Signed)
Patient had two large, loose bowel movements.  Algis Liming, PA notified and aware.  PA states that patient is on tube feeds only and loose stools are to be expected.  Will continue to monitor.

## 2014-08-02 NOTE — Progress Notes (Signed)
Patient information reviewed and entered into eRehab system by Kaliegh Willadsen, RN, CRRN, PPS Coordinator.  Information including medical coding and functional independence measure will be reviewed and updated through discharge.     Per nursing patient was given "Data Collection Information Summary for Patients in Inpatient Rehabilitation Facilities with attached "Privacy Act Statement-Health Care Records" upon admission.  

## 2014-08-02 NOTE — Progress Notes (Signed)
Recreational Therapy Session Note  Patient Details  Name: Tony Davis MRN: [id number] Date of Birth: [birth year] Today's Date: 08/02/2014  Order received.  Pt not appropriate for TR services at this time. Will continue to monitor through team for future participation. [place] 08/02/2014, 9:25 AM

## 2014-08-02 NOTE — Progress Notes (Signed)
Speech Language Pathology Daily Session Note  Patient Details  Name: Tony Davis MRN: [id number] Date of Birth: 1961/12/08  Today's Date: 08/02/2014 SLP Individual Time: 0930-1030 SLP Individual Time Calculation (min): 60 min  Short Term Goals: Week 1: SLP Short Term Goal 1 (Week 1): Patient will initiate verbal response to yes/no and open-ended questions within 5-10 seconds with moderate verbal/tactile cues. SLP Short Term Goal 2 (Week 1): Patient will tolerate current diet with no overt s/s aspiration. SLP Short Term Goal 3 (Week 1): Patient will tolerate PMV placement for all waking hours with no significant changes in SpO2 saturation. SLP Short Term Goal 4 (Week 1): Patient will follow basic level, 1-step directions with 75% accuracy.  Skilled Therapeutic Interventions: Skilled treatment session focused on addressing dysphagia and cognitive goals. Upon SLP entering room patient was sitting up in bed with PMSV donned with eyes open but not responsive to SLP greeting.  Following Max question cues patient was able to verbally respond to yes/no questions which revealed that he had been incontinent.  Patient required Total assist to use call bell and follow commands during bed mobility to assist with clean up.  SLP also facilitated PO intake with Total assist; patient demonstrated severely impaired attention and initiation which resulted in intermittent throat clears following sips of thin liquids and coughs following puree boluses.  As a result, Dys 2 was not attempted and patient was made NPO with PO trials with SLP only at this time.  Of note, patient appeared to tolerate PMSV well throughout duration of session today recommend to continue use during all waking hours.     FIM:  Comprehension Comprehension Mode: Auditory Comprehension: 1-Understands basic less than 25% of the time/requires cueing 75% of the time Expression Expression Mode: Verbal Expression Assistive Devices: 6-Talk  trach valve Expression: 1-Expresses basis less than 25% of the time/requires cueing greater than 75% of the time. Social Interaction Social Interaction: 1-Interacts appropriately less than 25% of the time. May be withdrawn or combative. Problem Solving Problem Solving: 1-Solves basic less than 25% of the time - needs direction nearly all the time or does not effectively solve problems and may need a restraint for safety Memory Memory: 1-Recognizes or recalls less than 25% of the time/requires cueing greater than 75% of the time FIM - Eating Eating Activity: 1: Helper feeds patient  Pain Pain Assessment Pain Assessment: No/denies pain  Therapy/Group: Individual Therapy  Carmelia Roller., CCC-SLP [phone]  [place] 08/02/2014, 11:59 AM

## 2014-08-02 NOTE — IPOC Note (Signed)
Overall Plan of Care [facility]) Patient Details Name: Tony Davis MRN: [id number] DOB: [birth year]  Admitting Diagnosis: DECOND  S P L BG   Hospital Problems: Active Problems:   Basal ganglia hemorrhage     Functional Problem List: Nursing Behavior;Bladder;Bowel;Edema;Endurance;Medication Management;Motor;Nutrition;Pain;Safety;Sensory;Skin Integrity  PT [employer];Safety;Edema;Endurance;Sensory;Motor;Perception  OT Balance;Cognition;Edema;Endurance;Motor;Perception;Safety;Sensory;Vision  SLP Cognition;Linguistic;Safety  TR         Basic ADL's: OT Eating;Grooming;Bathing;Dressing;Toileting     Advanced  ADL's: OT       Transfers: PT Bed Mobility;Bed to Chair;Car  OT Toilet;Tub/Shower     Locomotion: PT Ambulation;Wheelchair Mobility     Additional Impairments: OT    SLP        TR      Anticipated Outcomes Item Anticipated Outcome  Self Feeding PEG currently - feeding goal when appropriate  Swallowing  minimal cues/assist with least restrictive oral PO diet. (anticipate Dysphagia 3, thin liquids)   Basic self-care  Mod A  Toileting  Mod A    Bathroom Transfers Mod A  Bowel/Bladder  Min asst w/ bowel and bladder  Transfers  mod A transfers, mod A bed mobility  Locomotion  min A w/c propulsion; max A ambulation with therapy only  Communication  minimal assist to express basic wants/needs/thoughts  Cognition  min assist for basic problem solving, mod assist for attention  Pain  Pain level less than 3  Safety/Judgment  Remain safe while admitted to the rehab unit   Therapy Plan: PT Intensity: Minimum of 1-2 x/day ,45 to 90 minutes PT Frequency: 5 out of 7 days PT Duration Estimated Length of Stay: 28-31 days OT Intensity: Minimum of 1-2 x/day, 45 to 90 minutes OT Frequency: 5 out of 7 days OT Duration/Estimated Length of Stay: 28-31 days SLP Frequency: 5 out of 7 days SLP Duration/Estimated Length of Stay: 2-3 weeks       Team  Interventions: Nursing Interventions Patient/Family Education;Bladder Management;Bowel Management;Disease Management/Prevention;Pain Management;Medication Management;Cognitive Remediation/Compensation;Dysphagia/Aspiration Precaution Training;Discharge Planning;Psychosocial Support  PT interventions Ambulation/gait training;Cognitive remediation/compensation;Discharge planning;DME/adaptive equipment instruction;Functional mobility training;Pain management;Psychosocial support;Therapeutic Activities;UE/LE Strength taining/ROM;Balance/vestibular training;Disease management/prevention;Functional electrical stimulation;Neuromuscular re-education;Patient/family education;Therapeutic Exercise;UE/LE Coordination activities;Wheelchair propulsion/positioning;Visual/perceptual remediation/compensation  OT Interventions Balance/vestibular training;Cognitive remediation/compensation;Discharge planning;DME/adaptive equipment instruction;Functional mobility training;Psychosocial support;Therapeutic Activities;UE/LE Strength taining/ROM;Visual/perceptual remediation/compensation;Wheelchair propulsion/positioning;UE/LE Coordination activities;Therapeutic Exercise;Self Care/advanced ADL retraining;Patient/family education;Neuromuscular re-education  SLP Interventions Cognitive remediation/compensation;Speech/Language facilitation;Internal/external aids;Patient/family education;Dysphagia/aspiration precaution training;Functional tasks;Cueing hierarchy  TR Interventions    SW/CM Interventions      Team Discharge Planning: Destination: PT-Home (home vs. snf depending on progress) ,OT- Home , SLP-Skilled Nursing Facility (SNF) (to be determined) Projected Follow-up: PT-Skilled nursing facility;24 hour supervision/assistance;Home health PT (tbd), OT-  24 hour supervision/assistance, SLP-Skilled Nursing facility;Home Health SLP;24 hour supervision/assistance Projected Equipment Needs: PT-To be determined;Wheelchair cushion  (measurements);Wheelchair (measurements), OT- To be determined, SLP-Other (comment) (currently with PMV) Equipment Details: PT-tbd, OT-possibly has a cane or RW Patient/family involved in discharge planning: PT- Patient unable/family or caregiver not available,  OT-Patient, SLP-Patient unable/family or caregive not available  MD ELOS: 20-25 days  Medical Rehab Prognosis:  Good Assessment: 53 year old male with h/o DM type 2, CAD, OSA, chronic pain bilateral knees, CVA '11, PFO-on chronic coumadin who was admitted to OSH on 06/13/14 with left basal ganglia hemorrhage with intraventricular extension and hydrocephalus requiring right crani for evacuation as well as VP shunt placement  Hospital course complicated by acute respiratory failure requiring tracheostomy as well as PEG placement, BLE DVT requiring IVC placement as well as fevers due to enterobacter PNA. Patient with subsequent right hemiparesis, lethargy, nonverbal,  requiring noxious stimuli for arousal, difficulty tracking, dysconjugate gaze as well as difficulty following commands.  He was transferred to  [facility] on 07/08/14 for vent wean and rehab. He was maintained on meropenum for HCAP and malignant HTN managed with medication changes. Antibiotics completed on 08/10 but patient developed recurrent fever 101 F on 08/16 with purulent drainage from trach. He was pan cultured started on Levaquin for treatment. Stool negative for C diff. UCS without growth   Now requiring 24/7 Rehab RN,MD, as well as CIR level PT, OT and SLP.  Treatment team will focus on ADLs and mobility, swallowing and communication with goals set at Mod A   See Team Conference Notes for weekly updates to the plan of care

## 2014-08-02 NOTE — Progress Notes (Signed)
Occupational Therapy Session Note  Patient Details  Name: Tony Davis MRN: [id number] Date of Birth: 1961-05-04  Today's Date: 08/02/2014 OT Individual Time: 3244-0102 OT Individual Time Calculation (min): 45 min   Short Term Goals: Week 1:  OT Short Term Goal 1 (Week 1): Pt will perform dynamic sitting balance seated on EOB with Max A  in order to increase balance for ADLs. OT Short Term Goal 2 (Week 1): Pt will perform grooming with Max A in order to increase ADL performance. OT Short Term Goal 3 (Week 1): Pt will perform UB dressing with Max A in order to increase ADL performance. OT Short Term Goal 4 (Week 1): Pt will  engage in 5 minutes of functional activity before taking a rest break to increase endurance.   Skilled Therapeutic Interventions/Progress Updates:    Pt engaged in BADL retraining including bathing and dressing tasks supine with HOB elevated.  Pt oriented to place but continued repeating [facility] when asked what city.  Pt unable to state place when asked by physician later in session.  Pt incontinent of bowel and required tot A for hygiene.  Pt required min verbal cues to initiate UB bathing tasks when presented with supplies.  Pt required tot A for LB bathing and dressing secondary to time constraints.  Pt was able to sustain attention for approx 15 seconds during functional tasks. Pt required max A for rolling right and left in bed but did initiate tasks with verbal cues.  Focus on activity tolerance, bed mobility, task initiation, sequencing, and sustained attention.   Therapy Documentation Precautions:  Precautions Precautions: Fall;Other (comment) Precaution Comments: PEG/abdominal precautions, trach, Bilateral thigh high TED hose  Restrictions Weight Bearing Restrictions: No Pain: Pain Assessment Pain Assessment: No/denies pain  See FIM for current functional status  Therapy/Group: Individual Therapy  Leroy Libman 08/02/2014, 8:33 AM

## 2014-08-03 ENCOUNTER — Inpatient Hospital Stay (HOSPITAL_COMMUNITY): Payer: Medicare Other

## 2014-08-03 ENCOUNTER — Ambulatory Visit (HOSPITAL_COMMUNITY): Payer: Medicare Other

## 2014-08-03 ENCOUNTER — Encounter (HOSPITAL_COMMUNITY): Payer: Medicare Other

## 2014-08-03 ENCOUNTER — Inpatient Hospital Stay (HOSPITAL_COMMUNITY): Payer: Medicare Other | Admitting: Speech Pathology

## 2014-08-03 ENCOUNTER — Ambulatory Visit (HOSPITAL_COMMUNITY): Payer: Medicare Other | Admitting: Occupational Therapy

## 2014-08-03 DIAGNOSIS — IMO0002 Reserved for concepts with insufficient information to code with codable children: Secondary | ICD-10-CM

## 2014-08-03 DIAGNOSIS — Z5189 Encounter for other specified aftercare: Secondary | ICD-10-CM

## 2014-08-03 DIAGNOSIS — M1710 Unilateral primary osteoarthritis, unspecified knee: Secondary | ICD-10-CM

## 2014-08-03 DIAGNOSIS — I619 Nontraumatic intracerebral hemorrhage, unspecified: Secondary | ICD-10-CM

## 2014-08-03 DIAGNOSIS — J9600 Acute respiratory failure, unspecified whether with hypoxia or hypercapnia: Secondary | ICD-10-CM

## 2014-08-03 DIAGNOSIS — I10 Essential (primary) hypertension: Secondary | ICD-10-CM

## 2014-08-03 LAB — URINALYSIS, ROUTINE W REFLEX MICROSCOPIC
BILIRUBIN URINE: NEGATIVE
Glucose, UA: NEGATIVE mg/dL
Hgb urine dipstick: NEGATIVE
Ketones, ur: NEGATIVE mg/dL
Leukocytes, UA: NEGATIVE
Nitrite: NEGATIVE
PH: 6 (ref 5.0–8.0)
Protein, ur: NEGATIVE mg/dL
SPECIFIC GRAVITY, URINE: 1.02 (ref 1.005–1.030)
Urobilinogen, UA: 0.2 mg/dL (ref 0.0–1.0)

## 2014-08-03 LAB — GLUCOSE, CAPILLARY
GLUCOSE-CAPILLARY: 89 mg/dL (ref 70–99)
Glucose-Capillary: 88 mg/dL (ref 70–99)
Glucose-Capillary: 137 mg/dL — ABNORMAL HIGH (ref 70–99)

## 2014-08-03 MED ORDER — METHYLPHENIDATE HCL 5 MG PO TABS
5.0000 mg | ORAL_TABLET | Freq: Two times a day (BID) | ORAL | Status: DC
  Administered 2014-08-03 – 2014-09-07 (×68): 5 mg
  Filled 2014-08-03 (×68): qty 1

## 2014-08-03 MED ORDER — INSULIN ASPART 100 UNIT/ML ~~LOC~~ SOLN
0.0000 [IU] | Freq: Three times a day (TID) | SUBCUTANEOUS | Status: DC
  Administered 2014-08-03 – 2014-08-16 (×7): 2 [IU] via SUBCUTANEOUS
  Administered 2014-08-17: 3 [IU] via SUBCUTANEOUS
  Administered 2014-08-18: 2 [IU] via SUBCUTANEOUS

## 2014-08-03 NOTE — Progress Notes (Signed)
Physical Therapy Session Note  Patient Details  Name: Tony Davis MRN: [id number] Date of Birth: 20-Jan-1961  Today's Date: 08/03/2014 PT Co-Treatment Time: 1300-1330 PT Co-Treatment Time Calculation (min): 30 min (43min co-tx w/ COTA)  Short Term Goals: Week 1:  PT Short Term Goal 1 (Week 1): Pt will transfer out of bed to w/c using maxi sky lift PT Short Term Goal 2 (Week 1): Pt will tolerate sitting up in tilt in space w/c for 2 hours. PT Short Term Goal 3 (Week 1): Pt will roll in bed req 1 person assist PT Short Term Goal 4 (Week 1): Pt will tolerate supine/side lie to sit transfer req 2 person assist.  PT Short Term Goal 5 (Week 1): Pt will demonstrate sustained focus of an activity for 30 seconds.   Skilled Therapeutic Interventions/Progress Updates:  Pt received semi-reclined in bed w/ EC, req mod multimodal cues to open. Co-tx this session with COTA for focus on alertness/arousal, focused>sustained attention, initiation, following simple step commands and orientation. Pt req max A for t/f sup>sit EOB w/ HOB slightly elevated. Pt consistently initiating transfers to w/c from elevated bed or tx mat w/ mod cueing, but req Ax2persons for completion of squat pivot t/fs bed>w/c<>tx mat. Pt req close(S) for sitting balance EOB and EOM, but demonstrating poor focused attention req total for A donning shorts and to basic therapeutic tasks in gym environment with use privacy screens. Ax2 persons for standing to pull up pants. Pt w/ minimal verbalizations throughout session and req max multimodal cues overall. Pt left sitting in tilt in space w/c at end of session w/ all needs in reach and quick release belt in place.     Therapy Documentation Precautions:  Precautions Precautions: Fall;Other (comment) Precaution Comments: PEG/abdominal precautions, trach, Bilateral thigh high TED hose  Restrictions Weight Bearing Restrictions: No  See FIM for current functional status  Therapy/Group:  Co-Treatment  Otis Brace S 08/03/2014, 3:02 PM

## 2014-08-03 NOTE — Progress Notes (Signed)
Social Work Assessment and Plan Social Work Assessment and Plan  Patient Details  Name: Tony Davis MRN: [id number] Date of Birth: 1961-11-07  Today's Date: 08/02/2014  Problem List:  Patient Active Problem List   Diagnosis Date Noted  . Basal ganglia hemorrhage 07/30/2014  . Syncope due to orthostatic hypotension 04/15/2014  . Medication adverse effect 04/15/2014  . Syncope 04/15/2014  . Routine general medical examination at a health care facility 02/11/2014  . BPH associated with nocturia 02/11/2014  . Gynecomastia 02/11/2014  . Osteoarthritis of left knee 08/21/2013  . Osteoarthritis of right knee 08/21/2013  . Insomnia, persistent 03/25/2013  . History of tobacco use 03/25/2013  . Chronic rhinitis 03/25/2013  . Hemiparesis due to old cerebral infarction 03/20/2013  . DJD (degenerative joint disease) of knee 10/13/2012  . Folate deficiency anemia 10/13/2012  . Long term (current) use of anticoagulants 03/28/2012  . DVT (deep venous thrombosis) 03/19/2012  . Obstructive sleep apnea 08/16/2011  . Type II or unspecified type diabetes mellitus without mention of complication, uncontrolled 08/06/2011  . PATENT FORAMEN OVALE 03/13/2010  . CVA 02/08/2010  . CAD, NATIVE VESSEL 01/26/2010  . Hyperlipidemia LDL goal < 100 01/23/2010  . Essential hypertension, benign 01/23/2010  . GERD 01/23/2010   Past Medical History:  Past Medical History  Diagnosis Date  . GERD (gastroesophageal reflux disease)   . HLD (hyperlipidemia)   . HTN (hypertension)   . Hematuria 8/12  . PFO (patent foramen ovale)     echo 8/12: EF 55-65%, grade 2 diast dysfnx; +PFO on bubble study;  TEE 4/13: EF normal, atrial septum with suspicion for interatrial septum fenestrations without flow across and few large bubbles noted in LA.  This was not felt to require closure   . CAD (coronary artery disease)     a. s/p promus DES circumflex artery 10/13/10. b. Cath 04/2013: stable disease but possible small  vessel disease -Imdur added.  Marland Kitchen DVT (deep venous thrombosis) 2013    "right"   . TIA (transient ischemic attack)     "1-2; both after the one in 02/2010" (09/25/2013)  . OSA on CPAP   . Type II diabetes mellitus     June 2014 AIC 6.2%/notes 09/24/2013; pt denies this hx on 09/25/2013  . Arthritis     "knees" (09/25/2013)  . Rheumatoid arthritis(714.0)   . CVA (cerebral vascular accident) 02/2010    "lost peripheral vision when I had the stroke; some memory issues since" (09/25/2013)   Past Surgical History:  Past Surgical History  Procedure Laterality Date  . Knee arthroscopy Bilateral 2000's    2 on left and 3 on rt  . Tee without cardioversion  03/21/2012    Procedure: TRANSESOPHAGEAL ECHOCARDIOGRAM (TEE);  Surgeon: Fay Records, MD;  Location: [facility] ENDOSCOPY;  Service: Cardiovascular;  Laterality: N/A;  . Coronary angioplasty with stent placement  2012    "1" (09/24/2013)   Social History:  reports that he quit smoking about 2 years ago. His smoking use included Cigarettes. He has a 35 pack-year smoking history. He has never used smokeless tobacco. He reports that he does not drink alcohol or use illicit drugs.  Family / Support Systems Marital Status: Married How Long?: 28 yrs Patient Roles: Spouse Spouse/Significant Other: wife, Tony Davis @ (C) [phone] Children: Pt and wife have 5 adult children who are in the area (one son moving back to area to assist);  son, Tony (III) @ (C) [phone] Other Supports: pt's mother ([place]) and wife's  father;  other local extended family might  assist as needed - notes wife's uncle can build ramp if necessary Anticipated Caregiver: pt's mother and father-in-law will be alternating days while wife is at work Ability/Limitations of Caregiver: wife will be working during day. Pt's mother and father-in-law will be alternating caring for pt during day. Caregiver Availability: 24/7 Family Dynamics: Wife notes good relationship among all family  and all trying to coordinate care for pt.  She is concerned that pt's mother might change mind about moving to [place] to assist which could effect d/c plans.  Social History Preferred language: English Religion: Christian Cultural Background: NA Education: HS Read: Yes Write: Yes Employment Status: Disabled Date Retired/Disabled/Unemployed: approx 3 yrs out of work, however, just approved for SSD in Dec 2014 (coverage retroactivated to 08/2012) Legal Hisotry/Current Legal Issues: None Guardian/Conservator: None - per MD, pt not capable of making decision on his own behalf - defer to wife   Abuse/Neglect Physical Abuse: Denies Verbal Abuse: Denies Sexual Abuse: Denies Exploitation of patient/patient's resources: Denies Self-Neglect: Denies  Emotional Status Pt's affect, behavior adn adjustment status: Pt with significant cognitive and language deficits and cannot complete assessment interview himself.  Completed intake with pt's wife.  Basic introductions of myself and CIR info to pt provided.  Does not appear to be in any emotional distress - will monitor and will refer to neuropsych when appropriate. Recent Psychosocial Issues: Wife notes that since pt's stroke in 2011, he has suffered with personality and attentional deficits, however, remained active and independent overall.  No significatn stressors recently. Pyschiatric History: None Substance Abuse History: None  Patient / Family Perceptions, Expectations & Goals Pt/Family understanding of illness & functional limitations: Wife with very good understanding of pt's hemorrhage and significant deficits.  Has been present with pt throughout entire hospital course which began in [place] while they were there visiting family.  Wife seems to have good appreciation of the level of pt's limitations and anticipated care needs at d/c. Premorbid pt/family roles/activities: As noted, aside from some personality and minor speech issues following  stroke in 2011, pt was independent and active. Wife did manage all financial and legal matters of the home. Anticipated changes in roles/activities/participation: Pt now expected to need extensive physical assist at d/c which will greatly impact family as 24/7 physical assist will need to be in place at home.  Wife and family will need to assume f/t caregiver roles. Pt/family expectations/goals: Wife hopeful that she and family an coordinate care as she wishes to avoid SNF.  She notes all improvement in amount of physical assist  pt requires is primary goal.  [employer]: None Premorbid Home Care/DME Agencies:  (Attended OP tx at [facility] including neuropsych visits there folloiwing CVA) Transportation available at discharge: yes Resource referrals recommended: Neuropsychology;Support group (specify)  Discharge Planning Living Arrangements: Spouse/significant other;Children Support Systems: Spouse/significant other;Children;Parent;Other relatives;Friends/neighbors;Church/faith community Type of Residence: Private residence Insurance Resources: [employer] Financial Resources: [employer] Financial Screen Referred: No Living Expenses: Higher education careers adviser Management: Spouse Does the patient have any problems obtaining your medications?: No Home Management: wife Patient/Family Preliminary Plans: wife hopes to have pt return home with all family sharing 24/7 care for pt Barriers to Discharge: Family Support;Steps;Self care (wife concerned that pt's mother may "back out" of her offer to assist in [place]) Social Work Anticipated Follow Up Needs: HH/OP;SNF Expected length of stay: 30 days  Clinical Impression Significantly impaired gentleman who suffered hemorrhage while visiting family in [place].  Long hospitalization course and admitted to CIR with severe physical and cognitive deficits.  Wife and family very supportive and hopeful they can arrange 24/7 care for pt at home.   Wife with alternate plans in mind if caregiver commitments change.  Cannot assess emotional level for pt at this point due to overall deficits.  Will monitor and refer to neuropsych when appropriate.  Will follow for d/c planning needs and support.  Tony Davis 08/02/2014, 6:40 PM

## 2014-08-03 NOTE — Progress Notes (Signed)
Subjective/Complaints: 53 year old male with h/o DM type 2, CAD, OSA, chronic pain bilateral knees, CVA '11, PFO-on chronic coumadin who was admitted to OSH on 06/13/14 with left basal ganglia hemorrhage with intraventricular extension and hydrocephalus requiring right crani for evacuation as well as VP shunt placement Hospital course complicated by acute respiratory failure requiring tracheostomy as well as PEG placement, BLE DVT requiring IVC placement as well as fevers due to enterobacter PNA. Patient with subsequent right hemiparesis, lethargy, nonverbal, requiring noxious stimuli for arousal, difficulty tracking, dysconjugate gaze as well as difficulty following commands. He was transferred to [facility] on 07/08/14 for vent wean and rehab. He was maintained on meropenum for HCAP and malignant HTN managed with medication changes.  Respiratory culture grew moderate enterobacter cloacae with recommendations for 10 day treatment. BLE edema treated with compression wraps. Trach downsized to CFS # 6 on 08/15  No new issues overnight--still with low grade temp. Review of Systems - unable to obtain secondary to mental status Objective: Vital Signs: Blood pressure 122/79, pulse 103, temperature 100 F (37.8 C), temperature source Oral, resp. rate 18, weight 126.1 kg (278 lb), SpO2 99.00%. No results found. Results for orders placed during the hospital encounter of 07/30/14 (from the past 72 hour(s))  GLUCOSE, CAPILLARY     Status: None   Collection Time    07/31/14 11:32 AM      Result Value Ref Range   Glucose-Capillary 92  70 - 99 mg/dL   Comment 1 Notify RN    GLUCOSE, CAPILLARY     Status: Abnormal   Collection Time    07/31/14  4:58 PM      Result Value Ref Range   Glucose-Capillary 115 (*) 70 - 99 mg/dL   Comment 1 Notify RN    GLUCOSE, CAPILLARY     Status: None   Collection Time    07/31/14  8:35 PM      Result Value Ref Range   Glucose-Capillary 99  70 - 99 mg/dL  GLUCOSE, CAPILLARY      Status: None   Collection Time    08/01/14  7:11 AM      Result Value Ref Range   Glucose-Capillary 83  70 - 99 mg/dL   Comment 1 Notify RN    GLUCOSE, CAPILLARY     Status: None   Collection Time    08/01/14 12:07 PM      Result Value Ref Range   Glucose-Capillary 90  70 - 99 mg/dL   Comment 1 Notify RN    GLUCOSE, CAPILLARY     Status: Abnormal   Collection Time    08/01/14  5:06 PM      Result Value Ref Range   Glucose-Capillary 133 (*) 70 - 99 mg/dL   Comment 1 Notify RN    GLUCOSE, CAPILLARY     Status: Abnormal   Collection Time    08/01/14  9:02 PM      Result Value Ref Range   Glucose-Capillary 132 (*) 70 - 99 mg/dL  CBC WITH DIFFERENTIAL     Status: Abnormal   Collection Time    08/02/14  6:35 AM      Result Value Ref Range   WBC 6.5  4.0 - 10.5 K/uL   RBC 3.22 (*) 4.22 - 5.81 MIL/uL   Hemoglobin 9.5 (*) 13.0 - 17.0 g/dL   HCT 30.3 (*) 39.0 - 52.0 %   MCV 94.1  78.0 - 100.0 fL   MCH 29.5  26.0 - 34.0 pg   MCHC 31.4  30.0 - 36.0 g/dL   RDW 14.3  11.5 - 15.5 %   Platelets 317  150 - 400 K/uL   Neutrophils Relative % 66  43 - 77 %   Neutro Abs 4.3  1.7 - 7.7 K/uL   Lymphocytes Relative 24  12 - 46 %   Lymphs Abs 1.5  0.7 - 4.0 K/uL   Monocytes Relative 6  3 - 12 %   Monocytes Absolute 0.4  0.1 - 1.0 K/uL   Eosinophils Relative 4  0 - 5 %   Eosinophils Absolute 0.3  0.0 - 0.7 K/uL   Basophils Relative 0  0 - 1 %   Basophils Absolute 0.0  0.0 - 0.1 K/uL  COMPREHENSIVE METABOLIC PANEL     Status: Abnormal   Collection Time    08/02/14  6:35 AM      Result Value Ref Range   Sodium 147  137 - 147 mEq/L   Potassium 3.6 (*) 3.7 - 5.3 mEq/L   Chloride 105  96 - 112 mEq/L   CO2 30  19 - 32 mEq/L   Glucose, Bld 79  70 - 99 mg/dL   BUN 12  6 - 23 mg/dL   Creatinine, Ser 0.97  0.50 - 1.35 mg/dL   Calcium 9.5  8.4 - 10.5 mg/dL   Total Protein 6.7  6.0 - 8.3 g/dL   Albumin 3.0 (*) 3.5 - 5.2 g/dL   AST 15  0 - 37 U/L   ALT 15  0 - 53 U/L   Alkaline Phosphatase 109   39 - 117 U/L   Total Bilirubin <0.2 (*) 0.3 - 1.2 mg/dL   GFR calc non Af Amer >90  >90 mL/min   GFR calc Af Amer >90  >90 mL/min   Comment: (NOTE)     The eGFR has been calculated using the CKD EPI equation.     This calculation has not been validated in all clinical situations.     eGFR's persistently <90 mL/min signify possible Chronic Kidney     Disease.   Anion gap 12  5 - 15  GLUCOSE, CAPILLARY     Status: None   Collection Time    08/02/14  6:56 AM      Result Value Ref Range   Glucose-Capillary 89  70 - 99 mg/dL  GLUCOSE, CAPILLARY     Status: Abnormal   Collection Time    08/02/14 11:19 AM      Result Value Ref Range   Glucose-Capillary 105 (*) 70 - 99 mg/dL   Comment 1 Notify RN    GLUCOSE, CAPILLARY     Status: Abnormal   Collection Time    08/02/14  4:27 PM      Result Value Ref Range   Glucose-Capillary 128 (*) 70 - 99 mg/dL  GLUCOSE, CAPILLARY     Status: Abnormal   Collection Time    08/02/14 11:31 PM      Result Value Ref Range   Glucose-Capillary 146 (*) 70 - 99 mg/dL  URINALYSIS, ROUTINE W REFLEX MICROSCOPIC     Status: None   Collection Time    08/03/14 12:13 AM      Result Value Ref Range   Color, Urine YELLOW  YELLOW   APPearance CLEAR  CLEAR   Specific Gravity, Urine 1.020  1.005 - 1.030   pH 6.0  5.0 - 8.0   Glucose, UA NEGATIVE  NEGATIVE  mg/dL   Hgb urine dipstick NEGATIVE  NEGATIVE   Bilirubin Urine NEGATIVE  NEGATIVE   Ketones, ur NEGATIVE  NEGATIVE mg/dL   Protein, ur NEGATIVE  NEGATIVE mg/dL   Urobilinogen, UA 0.2  0.0 - 1.0 mg/dL   Nitrite NEGATIVE  NEGATIVE   Leukocytes, UA NEGATIVE  NEGATIVE   Comment: MICROSCOPIC NOT DONE ON URINES WITH NEGATIVE PROTEIN, BLOOD, LEUKOCYTES, NITRITE, OR GLUCOSE <1000 mg/dL.  GLUCOSE, CAPILLARY     Status: None   Collection Time    08/03/14  6:55 AM      Result Value Ref Range   Glucose-Capillary 88  70 - 99 mg/dL     HEENT: #6 shiley trach, site clean Cardio: RRR and no murmur Resp: CTA B/L and  unlabored GI: BS positive and PEG site clean and NT Extremity:  No Edema Skin:   Intact and Other Trach and PEG site crusted aroung stoma Neuro: Flat, Abnormal Sensory reduced on left difficult to assess secondary to MS, Abnormal Motor 4- LUE, 3- RLE, 2- LLE, 5/5 RUE? Moves RLE to pain., poor cooperation secondary to [place], Dysarthric and Aphasic. Makes eye contact.  Musc/Skel:  Normal GEN NAD   Assessment/Plan: 1. Functional deficits secondary to left Thalamic ICH, Hydrocephalus with Right  shunt which require 3+ hours per day of interdisciplinary therapy in a comprehensive inpatient rehab setting. Physiatrist is providing close team supervision and 24 hour management of active medical problems listed below. Physiatrist and rehab team continue to assess barriers to discharge/monitor patient progress toward functional and medical goals.  FIM: FIM - Bathing Bathing: 1: Total-Patient completes 0-2 of 10 parts or less than 25%  FIM - Upper Body Dressing/Undressing Upper body dressing/undressing: 1: Total-Patient completed less than 25% of tasks FIM - Lower Body Dressing/Undressing Lower body dressing/undressing: 1: Two helpers  FIM - Toileting Toileting: 0: No continent bowel/bladder events this shift  FIM - Air cabin crew Transfers: 0-Activity did not occur  FIM - Control and instrumentation engineer Devices: Arm rests Bed/Chair Transfer: 1: Two helpers;3: Supine > Sit: Mod A (lifting assist/Pt. 50-74%/lift 2 legs  FIM - Locomotion: Wheelchair Locomotion: Wheelchair: 1: Total Assistance/staff pushes wheelchair (Pt<25%) FIM - Locomotion: Ambulation Locomotion: Ambulation: 0: Activity did not occur  Comprehension Comprehension Mode: Auditory Comprehension: 1-Understands basic less than 25% of the time/requires cueing 75% of the time  Expression Expression Mode: Verbal Expression Assistive Devices: 6-Talk trach valve Expression: 1-Expresses basis less than 25%  of the time/requires cueing greater than 75% of the time.  Social Interaction Social Interaction: 1-Interacts appropriately less than 25% of the time. May be withdrawn or combative.  Problem Solving Problem Solving Mode: Not assessed Problem Solving: 1-Solves basic less than 25% of the time - needs direction nearly all the time or does not effectively solve problems and may need a restraint for safety  Memory Memory Mode: Not assessed Memory: 1-Recognizes or recalls less than 25% of the time/requires cueing greater than 75% of the time Medical Problem List and Plan:  1. Functional deficits secondary to Left thalamic bleed with hydrocephalus.  2. DVT Prophylaxis/Anticoagulation: Pharmaceutical: Lovenox- 3. Chronic B-knee/Pain Management: Prn oxycodone. Will add Voltaren gel to knees.  -poorly-compliant pain patient in my practice 4. Mood: Monitor as mentation improves.  5. Neuropsych: This patient is not capable of making decisions on his own behalf.  6. Skin/Wound Care: Will need to monitor BLE for signs of breakdown. Continue air mattress overlay. Will get dietician to help with appropriate calorie needs.  7.  DM type 2: Will monitor BS with ac/hs checks. Continue Levemir bid and adjust as indicated.  -reasonable control at present  8. Enterobacter tracheobronchitis: Continue Levaquin D # 7/10  7. HTN/CAD: continue Norvasc daily and metoprolol bid.  8. ABLA: on iron supplement.   9.  Low grade fever temp--likely atelectasis  -ua neg, culture pending  LOS (Days) 4 A FACE TO FACE EVALUATION WAS PERFORMED  Madisin Hasan T 08/03/2014, 7:56 AM

## 2014-08-03 NOTE — Plan of Care (Signed)
Problem: RH Balance Goal: LTG Patient will maintain dynamic sitting balance (PT) LTG: Patient will maintain dynamic sitting balance with assistance during mobility activities (PT)  Upgraded due to improved balance demonstrated

## 2014-08-03 NOTE — Plan of Care (Signed)
Problem: RH BLADDER ELIMINATION Goal: RH STG MANAGE BLADDER WITH MEDICATION WITH ASSISTANCE STG Manage Bladder With Medication With mod Assistance.  Outcome: Progressing Condom cath,

## 2014-08-03 NOTE — Progress Notes (Signed)
Occupational Therapy Session Note  Patient Details  Name: ITHAN TOUHEY MRN: [id number] Date of Birth: 1961-05-04  Today's Date: 08/03/2014 OT Co-Treatment Time: 1330-1400 (Cotx with Pt-total time 1300-1400) OT Co-Treatment Time Calculation (min): 30 min   Short Term Goals: Week 1:  OT Short Term Goal 1 (Week 1): Pt will perform dynamic sitting balance seated on EOB with Max A  in order to increase balance for ADLs. OT Short Term Goal 2 (Week 1): Pt will perform grooming with Max A in order to increase ADL performance. OT Short Term Goal 3 (Week 1): Pt will perform UB dressing with Max A in order to increase ADL performance. OT Short Term Goal 4 (Week 1): Pt will  engage in 5 minutes of functional activity before taking a rest break to increase endurance.   Skilled Therapeutic Interventions/Progress Updates:    Co tx with PT. Focus on bed mobility, sit<>stand, squat/scoot pivot transfers, task initiation, attention to task, and activity tolerance.  Pt requires max verbal cues to initiate transitional movements.  Pt requires max verbal cues to attend to tasks after initiated.  Sit<>stand and squat pivot transfers required tot A + 2.  When handed wash cloth and towel patient began folding without verbal cues but was unable to attend to task and did not complete task.  Pt was not oriented to place or his birth date this afternoon.  At completion of therapy session pt initiated shaking hand with therapist.  Therapy Documentation Precautions:  Precautions Precautions: Fall;Other (comment) Precaution Comments: PEG/abdominal precautions, trach, Bilateral thigh high TED hose  Restrictions Weight Bearing Restrictions: No  See FIM for current functional status  Therapy/Group: Individual Therapy  Leroy Libman 08/03/2014, 2:07 PM

## 2014-08-03 NOTE — Progress Notes (Signed)
NUTRITION FOLLOW-UP  DOCUMENTATION CODES Per approved criteria  -Obesity Unspecified   INTERVENTION: -Continue bolus tube feeding of Vital 1.5 formula at 450 ml TID with 30 ml Prostat once daily per PEG to provide 2125 kcal, 106 grams of protein, and 1026 ml of H2O.   -Continue free water flushes of 200 ml 5 times daily.  -Tube feeding regimen provides 100% of estimated nutrition needs.  -Will continue to monitor  NUTRITION Dx: Inadequate oral intake related to inability to eat as evidenced by NPO status; Ongoing  Goal: Pt to meet >/= 90% of their estimated nutrition needs, met  Monitor:  TF tolerance, labs, weights, I/O's  53 y.o. male  Admitting Dx: Basal Ganglia Hemorrhage  ASSESSMENT: Tony Davis is a 53 year old male with h/o DM type 2, CAD, OSA, chronic pain bilateral knees, CVA '11, PFO-on chronic coumadin who was admitted to OSH on 06/13/14 with left basal ganglia hemorrhage with intraventricular extension and hydrocephalus requiring right crani for evacuation as well as VP shunt placement Hospital course complicated by acute respiratory failure requiring tracheostomy as well as PEG placement, BLE DVT requiring IVC placement as well as fevers due to enterobacter PNA. Patient with subsequent right hemiparesis, lethargy, nonverbal, requiring noxious stimuli for arousal, difficulty tracking, dysconjugate gaze as well as difficulty following commands  8/22-Pt endorsed decreased appetite, however was unable to determine when decreased PO intake began -Is currently receiving feeding assistance, RD viewed breakfast tray with <25% completion. Pt had only consumed <50% of [employer] protein supplement -Weight appears to have maintained stable per previous medical records, fluctuates between 270-278 lbs in past 3 months (2.8% body weight, non-severe for time frame) -Receiving Vital 1.5 at nocturnal feeds of 6pm-6am at 65 ml/hr w/Pro-stat TID. MD requesting advancing to bolus feeds  until PO intake improves -Modified regimen to Vital 1.5 at 250 ml TID between meals  8/24-Dietitian was notified for initiation of full bolus tube feedings. Spoke with RN, pt has been tolerating the bolus tube feeding. Spoke with pt, pt reports having stomach pains, which had started yesterday.  -Pt was receiving bolus tube feeding:Vital 1.5 @ 250 ml TID with 30 ml Prostat via PEG TID. Tube feeding regimen provides 1425 kcal (70% of needs), 96 grams of protein (96% est protein needs), and 573 ml of H2O.  -Increased orders to Vital 1.5 to 450 ml TID and provide 30 ml Prostat per PEG once daily to provide 2125 kcal, 106 grams of protein, and 1026 ml of H2O.    8/25- Spoke with RN, she reports pt has been tolerating the bolus tube feeding well. Pt did have two large, loose bowel movements last night, however had not had one today. Spoke with pt, pt reports he continues to have abdominal pains. Pains however were not serious.   Spoke with PA about current TF regimen, discussed on continuing with current orders.  Pt is receiving bolus tube feedings of Vital 1.5 formula at 450 ml TID with 30 ml Prostat once daily per PEG providing 2125 kcal, 106 grams of protein, and 1026 ml of H2O.   Height: Ht Readings from Last 1 Encounters:  07/30/14 5' 11" (1.803 m)    Weight: Wt Readings from Last 1 Encounters:  07/30/14 278 lb (126.1 kg)   BMI:  Body mass index is 38.79 kg/(m^2). Class II obesity  Re-Estimated Nutritional Needs: Kcal: 2000-2200 Protein: 100-110 gram Fluid: >/=2000 ml/daily  Skin: WDL, + 2 gen edema  Diet Order: NPO   Intake/Output Summary (  Last 24 hours) at 08/03/14 1458 Last data filed at 08/02/14 2300  Gross per 24 hour  Intake    275 ml  Output     75 ml  Net    200 ml    Last BM: 8/24  Labs:   Recent Labs Lab 08/02/14 0635  NA 147  K 3.6*  CL 105  CO2 30  BUN 12  CREATININE 0.97  CALCIUM 9.5  GLUCOSE 79    CBG (last 3)   Recent Labs  08/02/14 2331  08/03/14 0655 08/03/14 1448  GLUCAP 146* 88 89    Scheduled Meds: . amLODipine  5 mg Oral Daily  . antiseptic oral rinse  7 mL Mouth Rinse q12n4p  . atorvastatin  20 mg Oral q1800  . chlorhexidine  15 mL Mouth Rinse BID  . diclofenac sodium  2 g Topical QID  . enoxaparin (LOVENOX) injection  40 mg Subcutaneous Q24H  . famotidine  20 mg Oral Daily  . feeding supplement (PRO-STAT SUGAR FREE 64)  30 mL Per Tube Q1200  . feeding supplement (VITAL 1.5 CAL)  450 mL Per Tube TID  . ferrous sulfate  300 mg Per Tube BID WC  . free water  200 mL Per Tube 5 X Daily  . insulin aspart  0-15 Units Subcutaneous TID WC  . insulin detemir  55 Units Subcutaneous BID  . levofloxacin  750 mg Oral Daily  . methylphenidate  5 mg Per Tube BID  . metoprolol tartrate  75 mg Oral BID  . saccharomyces boulardii  250 mg Oral BID    Continuous Infusions:   Past Medical History  Diagnosis Date  . GERD (gastroesophageal reflux disease)   . HLD (hyperlipidemia)   . HTN (hypertension)   . Hematuria 8/12  . PFO (patent foramen ovale)     echo 8/12: EF 55-65%, grade 2 diast dysfnx; +PFO on bubble study;  TEE 4/13: EF normal, atrial septum with suspicion for interatrial septum fenestrations without flow across and few large bubbles noted in LA.  This was not felt to require closure   . CAD (coronary artery disease)     a. s/p promus DES circumflex artery 10/13/10. b. Cath 04/2013: stable disease but possible small vessel disease -Imdur added.  . DVT (deep venous thrombosis) 2013    "right"   . TIA (transient ischemic attack)     "1-2; both after the one in 02/2010" (09/25/2013)  . OSA on CPAP   . Type II diabetes mellitus     June 2014 AIC 6.2%/notes 09/24/2013; pt denies this hx on 09/25/2013  . Arthritis     "knees" (09/25/2013)  . Rheumatoid arthritis(714.0)   . CVA (cerebral vascular accident) 02/2010    "lost peripheral vision when I had the stroke; some memory issues since" (09/25/2013)    Past  Surgical History  Procedure Laterality Date  . Knee arthroscopy Bilateral 2000's    2 on left and 3 on rt  . Tee without cardioversion  03/21/2012    Procedure: TRANSESOPHAGEAL ECHOCARDIOGRAM (TEE);  Surgeon: Paula V Ross, MD;  Location: [facility];  Service: Cardiovascular;  Laterality: N/A;  . Coronary angioplasty with stent placement  2012    "1" (09/24/2013)    La, MS, Provisional LDN Pager # [phone] After hours/ weekend pager # [phone]    

## 2014-08-03 NOTE — Progress Notes (Signed)
Occupational Therapy Session Note  Patient Details  Name: Tony Davis MRN: [id number] Date of Birth: [birth year]  Today's Date: 08/03/2014 OT Individual Time: 0800-0900 OT Individual Time Calculation (min): 60 min   Short Term Goals: Week 1:  OT Short Term Goal 1 (Week 1): Pt will perform dynamic sitting balance seated on EOB with Max A  in order to increase balance for ADLs. OT Short Term Goal 2 (Week 1): Pt will perform grooming with Max A in order to increase ADL performance. OT Short Term Goal 3 (Week 1): Pt will perform UB dressing with Max A in order to increase ADL performance. OT Short Term Goal 4 (Week 1): Pt will  engage in 5 minutes of functional activity before taking a rest break to increase endurance.   Skilled Therapeutic Interventions/Progress Updates:    Pt resting in bed upon arrival. Pt awake but did not initially acknowledge therapist's greeting.  Pt did respond after PMSV placed.  Focus on bed mobility, task initiation, sequencing, attention to task, transfers, and activity tolerance.  Pt required max multimodal cues to initiate approx 50% of BADLs and min verbal cues to initiate 50% of BADLs.  Pt required extra time to respond to commands.  Pt initiated sitting EOB and was able to maintain static sitting balance with steady A.  Pt performed squat/scoot pivot transfer with tot A + 2 (pt=25%). Pt completed upper body BADLs seated in w/c at sink.  Although patient initiated UB bathing/dressing he was unable to complete tasks secondary to decreased attention to task.    Therapy Documentation Precautions:  Precautions Precautions: Fall;Other (comment) Precaution Comments:  trach, Bilateral thigh high TED hose  Restrictions Weight Bearing Restrictions: No Pain: Pain Assessment Pain Assessment: No/denies pain  See FIM for current functional status  Therapy/Group: Individual Therapy  Leroy Libman 08/03/2014, 9:27 AM

## 2014-08-03 NOTE — Progress Notes (Signed)
Speech Language Pathology Daily Session Note  Patient Details  Name: Tony Davis MRN: [id number] Date of Birth: [birth year]  Today's Date: 08/03/2014 SLP Individual Time: 0900-1000 SLP Individual Time Calculation (min): 60 min  Short Term Goals: Week 1: SLP Short Term Goal 1 (Week 1): Patient will initiate verbal response to yes/no and open-ended questions within 5-10 seconds with moderate verbal/tactile cues. SLP Short Term Goal 2 (Week 1): Patient will tolerate current diet with no overt s/s aspiration. SLP Short Term Goal 3 (Week 1): Patient will tolerate PMV placement for all waking hours with no significant changes in SpO2 saturation. SLP Short Term Goal 4 (Week 1): Patient will follow basic level, 1-step directions with 75% accuracy.  Skilled Therapeutic Interventions: Skilled treatment session focused on addressing cognitive goals. Upon SLP entering room, patient was sitting upright in chair with PMSV donned with eyes open but not responsive to SLP greeting.  Patient required extra time and overall Max A for verbal initiation to answer basic yes/no questions and questions in regards to basic biographical information. Patient answered the questions with 50% accuracy. Pt required total A to initiate functional tasks and to follow 1 step commands. Unable to differentiate at this time if limited verbal expression is due to cognitive impairments, language impairments or a combination of both and patient will need continued diagnostic treatment.  PO trials were not attempted today due to patient's limited attention (3-5 seconds) and lethargy with verbal cues needed to keep his eyes open. Patient tolerated the PMSV throughout the session without difficulty, however, it was removed at the end of the session due to fatigue and likelihood of patient falling asleep. Patient left in tilt-in-space wheelchair with quick release belt in place. Continue with current plan of care.    FIM:   Comprehension Comprehension Mode: Auditory Comprehension: 1-Understands basic less than 25% of the time/requires cueing 75% of the time Expression Expression Mode: Verbal Expression Assistive Devices: 6-Talk trach valve Expression: 1-Expresses basis less than 25% of the time/requires cueing greater than 75% of the time. Social Interaction Social Interaction: 1-Interacts appropriately less than 25% of the time. May be withdrawn or combative. Problem Solving Problem Solving: 1-Solves basic less than 25% of the time - needs direction nearly all the time or does not effectively solve problems and may need a restraint for safety Memory Memory: 1-Recognizes or recalls less than 25% of the time/requires cueing greater than 75% of the time FIM - Eating Eating Activity: 1: Helper performs IV, parenteral, or tube feeding  Pain Pain Assessment Pain Assessment: No/denies pain  Therapy/Group: Individual Therapy  Shivali Quackenbush 08/03/2014, 4:05 PM

## 2014-08-03 NOTE — Progress Notes (Deleted)
Pt's care team in agreement to decreased pt's therapy schedule from 4.5hr to 3hr/day due to decreased activity tolerance overall.  Gilmore Laroche, PT, DPT  08/03/2014, 2:49 PM

## 2014-08-03 NOTE — Progress Notes (Signed)
Physical Therapy Session Note  Patient Details  Name: Tony Davis MRN: [id number] Date of Birth: 1961/01/03  Today's Date: 08/03/2014 PT Individual Time: 1530-1600 PT Individual Time Calculation (min): 30 min   Short Term Goals: Week 1:  PT Short Term Goal 1 (Week 1): Pt will transfer out of bed to w/c using maxi sky lift PT Short Term Goal 2 (Week 1): Pt will tolerate sitting up in tilt in space w/c for 2 hours. PT Short Term Goal 3 (Week 1): Pt will roll in bed req 1 person assist PT Short Term Goal 4 (Week 1): Pt will tolerate supine/side lie to sit transfer req 2 person assist.  PT Short Term Goal 5 (Week 1): Pt will demonstrate sustained focus of an activity for 30 seconds.   Skilled Therapeutic Interventions/Progress Updates:  Make up session. 1:1. Pt received sitting in tilt-in-space w/c, alert and verbalized "yes" that he was ready for therapy. Focus this session on focused>sustained attention, initiation, following simple commands and t/f sit<>stand. Pt req max A for initial t/f sit<>stand in steady then resting on seat. Pt initially req mod A for partial t/f sit<>stand from steady seat level decreasing to min guard A with multiple repetitions. Pt able to maintain standing ~30sec w/ min guard-min A. Pt req overall mod cues to initiate each t/f as well as counting, accurately up to 5 reps 2x. Noted tearing in pt's eyes at end of session, pt nodding head "yes" to pain in eyes and "burning." Shaking head no to all other locations/descriptors. RN made aware. Pt left sitting in tilt-in-space at end of session w/ all needs in reach, quick release belt in place.   Therapy Documentation Precautions:  Precautions Precautions: Fall;Other (comment) Precaution Comments: PEG/abdominal precautions, trach, Bilateral thigh high TED hose  Restrictions Weight Bearing Restrictions: No    See FIM for current functional status  Therapy/Group: Individual Therapy  Gilmore Laroche 08/03/2014,  4:04 PM

## 2014-08-03 NOTE — Care Management Note (Signed)
Inpatient [facility] Individual Statement of Services  Patient Name:  Tony Davis  Date:  08/02/2014  Welcome to the [facility].  Our goal is to provide you with an individualized program based on your diagnosis and situation, designed to meet your specific needs.  With this comprehensive rehabilitation program, you will be expected to participate in at least 3 hours of rehabilitation therapies Monday-Friday, with modified therapy programming on the weekends.  Your rehabilitation program will include the following services:  Physical Therapy (PT), Occupational Therapy (OT), Speech Therapy (ST), 24 hour per day rehabilitation nursing, Therapeutic Recreaction (TR), Neuropsychology, Case Management (Social Worker), Rehabilitation Medicine, Nutrition Services and Pharmacy Services  Weekly team conferences will be held on Tuesdays to discuss your progress.  Your Social Worker will talk with you frequently to get your input and to update you on team discussions.  Team conferences with you and your family in attendance may also be held.  Expected length of stay: 30 days  Overall anticipated outcome: moderate assistance  Depending on your progress and recovery, your program may change. Your Social Worker will coordinate services and will keep you informed of any changes. Your Social Worker's name and contact numbers are listed  below.  The following services may also be recommended but are not provided by the [facility] will be made to provide these services after discharge if needed.  Arrangements include referral to agencies that provide these services.  Your insurance has been verified to be:  Medicare Your primary doctor is:  Dr. [name]  Pertinent information will be shared with your  doctor and your insurance company.  Social Worker:  Dixie Union, [place] or ([phone]   Information discussed with and copy given to patient by: Lennart Pall, 08/02/2014, 6:45 PM

## 2014-08-04 ENCOUNTER — Encounter (HOSPITAL_COMMUNITY): Payer: Medicare Other

## 2014-08-04 ENCOUNTER — Inpatient Hospital Stay (HOSPITAL_COMMUNITY): Payer: Medicare Other | Admitting: Speech Pathology

## 2014-08-04 ENCOUNTER — Inpatient Hospital Stay (HOSPITAL_COMMUNITY): Payer: Medicare Other

## 2014-08-04 ENCOUNTER — Ambulatory Visit (HOSPITAL_COMMUNITY): Payer: Medicare Other | Admitting: Occupational Therapy

## 2014-08-04 DIAGNOSIS — Z5189 Encounter for other specified aftercare: Secondary | ICD-10-CM

## 2014-08-04 DIAGNOSIS — J9600 Acute respiratory failure, unspecified whether with hypoxia or hypercapnia: Secondary | ICD-10-CM

## 2014-08-04 DIAGNOSIS — I10 Essential (primary) hypertension: Secondary | ICD-10-CM

## 2014-08-04 DIAGNOSIS — IMO0002 Reserved for concepts with insufficient information to code with codable children: Secondary | ICD-10-CM

## 2014-08-04 DIAGNOSIS — M1710 Unilateral primary osteoarthritis, unspecified knee: Secondary | ICD-10-CM

## 2014-08-04 DIAGNOSIS — I619 Nontraumatic intracerebral hemorrhage, unspecified: Secondary | ICD-10-CM

## 2014-08-04 LAB — GLUCOSE, CAPILLARY
GLUCOSE-CAPILLARY: 91 mg/dL (ref 70–99)
Glucose-Capillary: 134 mg/dL — ABNORMAL HIGH (ref 70–99)
Glucose-Capillary: 106 mg/dL — ABNORMAL HIGH (ref 70–99)

## 2014-08-04 NOTE — Procedures (Signed)
Tracheostomy Change Note  Patient Details:   Name: CLARION MOONEYHAN DOB: 1961-02-01 MRN: [id number]    Airway Documentation:     Evaluation  O2 sats: stable throughout Complications: No apparent complications Patient did tolerate procedure well. Bilateral Breath Sounds: Clear;Diminished   Pt tolerated trach change well with stable SpO2 and HR throughout the procedure.  6.0 Shiley uncuffed trach removed and 4.0 Shiley uncuffed trach put into place and secured. Ez-Cap color change purple to yellow confirming placement in the lungs.  PMV in place and Pt able to speak. Irven Shelling 08/04/2014, 1:15 PM

## 2014-08-04 NOTE — Progress Notes (Signed)
Note/chart reviewed.  Katie Deeanna Beightol, RD, LDN Pager #: [phone] After-Hours Pager #: [phone]  

## 2014-08-04 NOTE — Progress Notes (Signed)
Physical Therapy Session Note  Patient Details  Name: Tony Davis MRN: [id number] Date of Birth: 10-11-1961  Today's Date: 08/04/2014 PT Individual Time: Treatment Session 1: 1300-1400; Treatment Session 2: 0932-3557 PT Individual Time Calculation (min): Treatment Session 1: 60 min ; Treatment Session 2: 36min  Short Term Goals: Week 1:  PT Short Term Goal 1 (Week 1): Pt will transfer out of bed to w/c using maxi sky lift PT Short Term Goal 2 (Week 1): Pt will tolerate sitting up in tilt in space w/c for 2 hours. PT Short Term Goal 3 (Week 1): Pt will roll in bed req 1 person assist PT Short Term Goal 4 (Week 1): Pt will tolerate supine/side lie to sit transfer req 2 person assist.  PT Short Term Goal 5 (Week 1): Pt will demonstrate sustained focus of an activity for 30 seconds.   Skilled Therapeutic Interventions/Progress Updates:  Treatment Session 1:  1:1. Pt received semi-reclined in bed, respiratory therapists just completed downsizing pt's trach size. Pt with productive cough throughout session, likely due to irritation from trach change. Focus this session on activity tolerance, alertness/arousal, focused>sustained attention, initiation, bed mobility, functional transfers and ambulation. Pt noted to be incontinent, total Ax2 persons for clean up and B rolling due to decreased arousal. Pt req max multimodal cues to sustain alertness and focused attention to therapeutic tasks once sitting EOB. Pt req max multimodal cues for initiation of t/f sup>sit w/ Ax2 persons and LB dressing EOB w/ total Ax1person for completion. Pt demonstrating increased initiation of t/f sit<>stand w/ bed height elevated and use of Sara +. Pt able to progress to ambulation 100'x1 w/ use of Clarise Cruz + and walking sling, Ax2 persons for safety. Pt demonstrating heavy reliance on B UE, but no flexion noted at hip/knees. Therapist initiating increased step length w/ increase speed of guidance from [place] +. Pt left sitting  in tilt-in-space at end of session w/ all needs in reach and quick release belt in place.  Treatment Session 2:  1:1. Pt received sitting in tilt-in-space, ready for therapy. Focus this session on activity tolerance, following simple step commands, initiation and functional transfers. Pt req Ax2 persons for completion of SPT w/c>bed w/ use of Sara + and foot place removed to facilitate stepping. Pt req max multimodal cues to initiate stepping in B LE, progress knee flexion noted this session. Pt req max multimodal cues for overall extension. Once sitting EOB, max A x1person for t/f sit>sup. Pt supine in bed at end of session, left in care of nurse tech.   Therapy Documentation Precautions:  Precautions Precautions: Fall;Other (comment) Precaution Comments: PEG/abdominal precautions, trach, Bilateral thigh high TED hose  Restrictions Weight Bearing Restrictions: No Vital Signs: Therapy Vitals Temp: 98.8 F (37.1 C) Temp src: Oral Pulse Rate: 102 Resp: 18 BP: 152/89 mmHg Patient Position (if appropriate): Sitting Oxygen Therapy SpO2: 97 % O2 Device: Trach collar O2 Flow Rate (L/min): 5 L/min FiO2 (%): 28 % Pain: Pain Assessment Pain Assessment: No/denies pain  See FIM for current functional status  Therapy/Group: Individual Therapy  Gilmore Laroche 08/04/2014, 5:03 PM

## 2014-08-04 NOTE — Progress Notes (Signed)
Pt had PMV on while doing check.

## 2014-08-04 NOTE — Progress Notes (Signed)
Subjective/Complaints: 53 year old male with h/o DM type 2, CAD, OSA, chronic pain bilateral knees, CVA '11, PFO-on chronic coumadin who was admitted to OSH on 06/13/14 with left basal ganglia hemorrhage with intraventricular extension and hydrocephalus requiring right crani for evacuation as well as VP shunt placement Hospital course complicated by acute respiratory failure requiring tracheostomy as well as PEG placement, BLE DVT requiring IVC placement as well as fevers due to enterobacter PNA. Patient with subsequent right hemiparesis, lethargy, nonverbal, requiring noxious stimuli for arousal, difficulty tracking, dysconjugate gaze as well as difficulty following commands. He was transferred to [facility] on 07/08/14 for vent wean and rehab. He was maintained on meropenum for HCAP and malignant HTN managed with medication changes.  Respiratory culture grew moderate enterobacter cloacae with recommendations for 10 day treatment. BLE edema treated with compression wraps. Trach downsized to CFS # 6 on 08/15  No new issues overnight--still with low grade temp. Review of Systems - unable to obtain secondary to mental status Objective: Vital Signs: Blood pressure 128/82, pulse 97, temperature 98.5 F (36.9 C), temperature source Oral, resp. rate 20, weight 112.2 kg (247 lb 5.7 oz), SpO2 100.00%. No results found. Results for orders placed during the hospital encounter of 07/30/14 (from the past 72 hour(s))  GLUCOSE, CAPILLARY     Status: None   Collection Time    08/01/14 12:07 PM      Result Value Ref Range   Glucose-Capillary 90  70 - 99 mg/dL   Comment 1 Notify RN    GLUCOSE, CAPILLARY     Status: Abnormal   Collection Time    08/01/14  5:06 PM      Result Value Ref Range   Glucose-Capillary 133 (*) 70 - 99 mg/dL   Comment 1 Notify RN    GLUCOSE, CAPILLARY     Status: Abnormal   Collection Time    08/01/14  9:02 PM      Result Value Ref Range   Glucose-Capillary 132 (*) 70 - 99 mg/dL  CBC  WITH DIFFERENTIAL     Status: Abnormal   Collection Time    08/02/14  6:35 AM      Result Value Ref Range   WBC 6.5  4.0 - 10.5 K/uL   RBC 3.22 (*) 4.22 - 5.81 MIL/uL   Hemoglobin 9.5 (*) 13.0 - 17.0 g/dL   HCT 30.3 (*) 39.0 - 52.0 %   MCV 94.1  78.0 - 100.0 fL   MCH 29.5  26.0 - 34.0 pg   MCHC 31.4  30.0 - 36.0 g/dL   RDW 14.3  11.5 - 15.5 %   Platelets 317  150 - 400 K/uL   Neutrophils Relative % 66  43 - 77 %   Neutro Abs 4.3  1.7 - 7.7 K/uL   Lymphocytes Relative 24  12 - 46 %   Lymphs Abs 1.5  0.7 - 4.0 K/uL   Monocytes Relative 6  3 - 12 %   Monocytes Absolute 0.4  0.1 - 1.0 K/uL   Eosinophils Relative 4  0 - 5 %   Eosinophils Absolute 0.3  0.0 - 0.7 K/uL   Basophils Relative 0  0 - 1 %   Basophils Absolute 0.0  0.0 - 0.1 K/uL  COMPREHENSIVE METABOLIC PANEL     Status: Abnormal   Collection Time    08/02/14  6:35 AM      Result Value Ref Range   Sodium 147  137 - 147 mEq/L  Potassium 3.6 (*) 3.7 - 5.3 mEq/L   Chloride 105  96 - 112 mEq/L   CO2 30  19 - 32 mEq/L   Glucose, Bld 79  70 - 99 mg/dL   BUN 12  6 - 23 mg/dL   Creatinine, Ser 0.97  0.50 - 1.35 mg/dL   Calcium 9.5  8.4 - 10.5 mg/dL   Total Protein 6.7  6.0 - 8.3 g/dL   Albumin 3.0 (*) 3.5 - 5.2 g/dL   AST 15  0 - 37 U/L   ALT 15  0 - 53 U/L   Alkaline Phosphatase 109  39 - 117 U/L   Total Bilirubin <0.2 (*) 0.3 - 1.2 mg/dL   GFR calc non Af Amer >90  >90 mL/min   GFR calc Af Amer >90  >90 mL/min   Comment: (NOTE)     The eGFR has been calculated using the CKD EPI equation.     This calculation has not been validated in all clinical situations.     eGFR's persistently <90 mL/min signify possible Chronic Kidney     Disease.   Anion gap 12  5 - 15  GLUCOSE, CAPILLARY     Status: None   Collection Time    08/02/14  6:56 AM      Result Value Ref Range   Glucose-Capillary 89  70 - 99 mg/dL  GLUCOSE, CAPILLARY     Status: Abnormal   Collection Time    08/02/14 11:19 AM      Result Value Ref Range    Glucose-Capillary 105 (*) 70 - 99 mg/dL   Comment 1 Notify RN    GLUCOSE, CAPILLARY     Status: Abnormal   Collection Time    08/02/14  4:27 PM      Result Value Ref Range   Glucose-Capillary 128 (*) 70 - 99 mg/dL  GLUCOSE, CAPILLARY     Status: Abnormal   Collection Time    08/02/14 11:31 PM      Result Value Ref Range   Glucose-Capillary 146 (*) 70 - 99 mg/dL  URINALYSIS, ROUTINE W REFLEX MICROSCOPIC     Status: None   Collection Time    08/03/14 12:13 AM      Result Value Ref Range   Color, Urine YELLOW  YELLOW   APPearance CLEAR  CLEAR   Specific Gravity, Urine 1.020  1.005 - 1.030   pH 6.0  5.0 - 8.0   Glucose, UA NEGATIVE  NEGATIVE mg/dL   Hgb urine dipstick NEGATIVE  NEGATIVE   Bilirubin Urine NEGATIVE  NEGATIVE   Ketones, ur NEGATIVE  NEGATIVE mg/dL   Protein, ur NEGATIVE  NEGATIVE mg/dL   Urobilinogen, UA 0.2  0.0 - 1.0 mg/dL   Nitrite NEGATIVE  NEGATIVE   Leukocytes, UA NEGATIVE  NEGATIVE   Comment: MICROSCOPIC NOT DONE ON URINES WITH NEGATIVE PROTEIN, BLOOD, LEUKOCYTES, NITRITE, OR GLUCOSE <1000 mg/dL.  GLUCOSE, CAPILLARY     Status: None   Collection Time    08/03/14  6:55 AM      Result Value Ref Range   Glucose-Capillary 88  70 - 99 mg/dL  GLUCOSE, CAPILLARY     Status: None   Collection Time    08/03/14  2:48 PM      Result Value Ref Range   Glucose-Capillary 89  70 - 99 mg/dL   Comment 1 Notify RN    GLUCOSE, CAPILLARY     Status: Abnormal   Collection Time  08/03/14  9:50 PM      Result Value Ref Range   Glucose-Capillary 137 (*) 70 - 99 mg/dL  GLUCOSE, CAPILLARY     Status: None   Collection Time    08/04/14  6:21 AM      Result Value Ref Range   Glucose-Capillary 91  70 - 99 mg/dL   Comment 1 Notify RN       HEENT: #6 shiley trach, site clean Cardio: RRR and no murmur Resp: CTA B/L and unlabored, clear. sats 100% GI: BS positive and PEG site clean and NT Extremity:  No Edema Skin:   Intact and Other Trach and PEG site crusted aroung  stoma Neuro: more alert. Initiates more Abnormal Sensory reduced on left difficult to assess secondary to MS, Abnormal Motor 4- LUE, 3- RLE, 2- LLE, 5/5 RUE? Moves RLE to pain., Dysarthric and Aphasic. Makes eye contact. Nods y/n Musc/Skel:  Normal GEN NAD   Assessment/Plan: 1. Functional deficits secondary to left Thalamic ICH, Hydrocephalus with Right  shunt which require 3+ hours per day of interdisciplinary therapy in a comprehensive inpatient rehab setting. Physiatrist is providing close team supervision and 24 hour management of active medical problems listed below. Physiatrist and rehab team continue to assess barriers to discharge/monitor patient progress toward functional and medical goals.  FIM: FIM - Bathing Bathing Steps Patient Completed: Abdomen Bathing: 1: Total-Patient completes 0-2 of 10 parts or less than 25%  FIM - Upper Body Dressing/Undressing Upper body dressing/undressing steps patient completed: Thread/unthread left sleeve of pullover shirt/dress Upper body dressing/undressing: 1: Total-Patient completed less than 25% of tasks FIM - Lower Body Dressing/Undressing Lower body dressing/undressing: 1: Two helpers  FIM - Toileting Toileting: 0: No continent bowel/bladder events this shift  FIM - Air cabin crew Transfers: 0-Activity did not occur  FIM - Control and instrumentation engineer Devices: Arm rests Bed/Chair Transfer: 1: Two helpers;2: Supine > Sit: Max A (lifting assist/Pt. 25-49%)  FIM - Locomotion: Wheelchair Locomotion: Wheelchair: 1: Total Assistance/staff pushes wheelchair (Pt<25%) FIM - Locomotion: Ambulation Locomotion: Ambulation: 0: Activity did not occur  Comprehension Comprehension Mode: Auditory Comprehension: 1-Understands basic less than 25% of the time/requires cueing 75% of the time  Expression Expression Mode: Verbal Expression Assistive Devices: 6-Talk trach valve Expression: 1-Expresses basis less than 25%  of the time/requires cueing greater than 75% of the time.  Social Interaction Social Interaction: 1-Interacts appropriately less than 25% of the time. May be withdrawn or combative.  Problem Solving Problem Solving Mode: Not assessed Problem Solving: 1-Solves basic less than 25% of the time - needs direction nearly all the time or does not effectively solve problems and may need a restraint for safety  Memory Memory Mode: Not assessed Memory: 1-Recognizes or recalls less than 25% of the time/requires cueing greater than 75% of the time Medical Problem List and Plan:  1. Functional deficits secondary to Left thalamic bleed with hydrocephalus.  2. DVT Prophylaxis/Anticoagulation: Pharmaceutical: Lovenox- 3. Chronic B-knee/Pain Management: Prn oxycodone. Will add Voltaren gel to knees.  -poorly-compliant pain patient in my practice 4. Mood: Monitor as mentation improves.  5. Neuropsych: This patient is not capable of making decisions on his own behalf.  6. Skin/Wound Care:   Continue air mattress overlay.  .  7. DM type 2: Will monitor BS with ac/hs checks. Continue Levemir bid and adjust as indicated.  -reasonable control at present  8. Enterobacter tracheobronchitis: Continue Levaquin through today  -downsize trach today to #4 7. HTN/CAD: continue Norvasc daily  and metoprolol bid.  8. ABLA: on iron supplement.   9.  Low grade fever temp--likely atelectasis  -ua neg, culture still pending  LOS (Days) 5 A FACE TO FACE EVALUATION WAS PERFORMED  SWARTZ,ZACHARY T 08/04/2014, 7:50 AM

## 2014-08-04 NOTE — Patient Care Conference (Signed)
Inpatient RehabilitationTeam Conference and Plan of Care Update Date: 08/03/2014   Time: 2:55 PM    Patient Name: Tony Davis      Medical Record Number: [id number]  Date of Birth: Jun 22, 1961 Sex: Male         Room/Bed: 4W05C/4W05C-01 Payor Info: Payor: MEDICARE / Plan: MEDICARE PART A AND B / Product Type: *No Product type* /    Admitting Diagnosis: DECOND  S P L BG   Admit Date/Time:  07/30/2014  8:40 PM Admission Comments: No comment available   Primary Diagnosis:  <principal problem not specified> Principal Problem: <principal problem not specified>  Patient Active Problem List   Diagnosis Date Noted  . Basal ganglia hemorrhage 07/30/2014  . Syncope due to orthostatic hypotension 04/15/2014  . Medication adverse effect 04/15/2014  . Syncope 04/15/2014  . Routine general medical examination at a health care facility 02/11/2014  . BPH associated with nocturia 02/11/2014  . Gynecomastia 02/11/2014  . Osteoarthritis of left knee 08/21/2013  . Osteoarthritis of right knee 08/21/2013  . Insomnia, persistent 03/25/2013  . History of tobacco use 03/25/2013  . Chronic rhinitis 03/25/2013  . Hemiparesis due to old cerebral infarction 03/20/2013  . DJD (degenerative joint disease) of knee 10/13/2012  . Folate deficiency anemia 10/13/2012  . Long term (current) use of anticoagulants 03/28/2012  . DVT (deep venous thrombosis) 03/19/2012  . Obstructive sleep apnea 08/16/2011  . Type II or unspecified type diabetes mellitus without mention of complication, uncontrolled 08/06/2011  . PATENT FORAMEN OVALE 03/13/2010  . CVA 02/08/2010  . CAD, NATIVE VESSEL 01/26/2010  . Hyperlipidemia LDL goal < 100 01/23/2010  . Essential hypertension, benign 01/23/2010  . GERD 01/23/2010    Expected Discharge Date: Expected Discharge Date:  (4 weeks)  Team Members Present: Physician leading conference: Dr. Alger Simons Social Worker Present: Lennart Pall, LCSW Nurse Present: Elliot Cousin,  RN PT Present: Raylene Everts, PT;Bridgett Ripa, PT;Caroline King, Dustin Folks, PT OT Present: Roanna Epley, Griffin Basil, OT SLP Present: Weston Anna, SLP PPS Coordinator present : Daiva Nakayama, RN, CRRN     Current Status/Progress Goal Weekly Team Focus  Medical   left ICH--VPS, poor arousal and intitation  improve activity tolerance/arousal  NPO, stimulation   Bowel/Bladder   Incontinent of bowel and bladder, LBM 08/02/14 condom cath in place  Manage bowel and bladder incontinence, brief  and catheter in place, attemtp timed tolieting  Offer toliet q2hr and PRN   Swallow/Nutrition/ Hydration   NPO with PEG  Max A  trials of thin liquids and puree textures, attention to task    ADL's   tot A overall, dcreased sustained attention, 0/4 orientation,   mod A overall, min A UB dressing  activity tolerance, bed mobility, attention, transfers, sitting balance   Mobility   Mod A x1 person to Ax2 persons overall; total A for orientation, max cues for focused>sustained attention, initiation  Supervision for sitting balance; Min A for cognition and w/c propulsion; Mod A for bed mobility, transfers; Max A for standing balance and ambulation  activity tolerance, bed mobility, transfers, balance, standing, attention, initiation, orientation   Communication   total A  Mod A  initiation of expression of wants/needs, following 1 step commands    Safety/Cognition/ Behavioral Observations  Total A  Mod A  attention, initiation, orientation    Pain   Denies/ Does not show nonverbal cues   oxy ir 5-10mg  q6hr, Tylenol 325-650mg  q4hr  Pain Less than 3  Assess for pain q  shift and prn    Skin   Dry , intact air mattress in use  No new skin breakdown, no new s/s of infection  Turn q2hr and q1hr in wheelchair, assess q shift and PRN     Rehab Goals Patient on target to meet rehab goals: Yes *See Care Plan and progress notes for long and short-term goals.  Barriers to Discharge: profound neuro  deficits    Possible Resolutions to Barriers:  improve activity tolerance.     Discharge Planning/Teaching Needs:  wife hopeful she and family can take pt home and meet 24/7 anticipated care needs.    ongoing   Team Discussion:  Pt familiar to MD as he has followed him as an outpatient.  Very impaired overall but good movement in general.  Grossly weak with decreased alertness and attention.  NPO for now with plans to do another MBS.  Team feels pt has potential to make nice gains.  Goals at min/ mod assist currently.  Revisions to Treatment Plan:  None   Continued Need for Acute Rehabilitation Level of Care: The patient requires daily medical management by a physician with specialized training in physical medicine and rehabilitation for the following conditions: Daily direction of a multidisciplinary physical rehabilitation program to ensure safe treatment while eliciting the highest outcome that is of practical value to the patient.: Yes Daily medical management of patient stability for increased activity during participation in an intensive rehabilitation regime.: Yes Daily analysis of laboratory values and/or radiology reports with any subsequent need for medication adjustment of medical intervention for : Post surgical problems;Pulmonary problems;Neurological problems  Aleiyah Halpin 08/04/2014, 9:11 AM

## 2014-08-04 NOTE — Progress Notes (Signed)
Speech Language Pathology Daily Session Note  Patient Details  Name: Tony Davis MRN: [id number] Date of Birth: 05-22-1961  Today's Date: 08/04/2014 SLP Individual Time: 1445-1515 SLP Individual Time Calculation (min): 30 min  Short Term Goals: Week 1: SLP Short Term Goal 1 (Week 1): Patient will initiate verbal response to yes/no and open-ended questions within 5-10 seconds with moderate verbal/tactile cues. SLP Short Term Goal 2 (Week 1): Patient will tolerate current diet with no overt s/s aspiration. SLP Short Term Goal 3 (Week 1): Patient will tolerate PMV placement for all waking hours with no significant changes in SpO2 saturation. SLP Short Term Goal 4 (Week 1): Patient will follow basic level, 1-step directions with 75% accuracy.  Skilled Therapeutic Interventions: Skilled treatment session focused on cognitive-linguistic and dysphagia goals. Upon arrival, pt was asleep while upright in chair with PMSV in place. Vitals were [facility] and remained stable throughout the session. SLP facilitated session by initially providing Max A multimodal cues for arousal and focused attention to task due to lethargy.  Pt required Max A multimodal cues for initiation during oral care with a suction toothbrush. Pt also demonstrated increased verbal initiation and answered ~90% of yes/no questions in regards to wants/needs and demonstrated increased social interaction with increased gestures and a brighter affect.  SLP facilitated trials of ice chips and patient demonstrated a timely swallow initiation without overt s/s of aspiration.  Patient left in tilt-in-space wheelchair with quick release belt on and all needs within reach. Continue with current plan of care.    FIM:  Comprehension Comprehension Mode: Auditory Comprehension: 1-Understands basic less than 25% of the time/requires cueing 75% of the time Expression Expression Mode: Verbal Expression: 1-Expresses basis less than 25% of the  time/requires cueing greater than 75% of the time. Social Interaction Social Interaction: 1-Interacts appropriately less than 25% of the time. May be withdrawn or combative. Problem Solving Problem Solving: 1-Solves basic less than 25% of the time - needs direction nearly all the time or does not effectively solve problems and may need a restraint for safety Memory Memory: 1-Recognizes or recalls less than 25% of the time/requires cueing greater than 75% of the time FIM - Eating Eating Activity: 1: Helper feeds patient  Pain Pain Assessment Pain Assessment: No/denies pain  Therapy/Group: Individual Therapy  Maxine Fredman 08/04/2014, 3:27 PM

## 2014-08-04 NOTE — Progress Notes (Signed)
Occupational Therapy Session Note  Patient Details  Name: SOU NOHR MRN: [id number] Date of Birth: [birth year]  Today's Date: 08/04/2014 OT Individual Time: 0900-1000 OT Individual Time Calculation (min): 60 min   Short Term Goals: Week 1:  OT Short Term Goal 1 (Week 1): Pt will perform dynamic sitting balance seated on EOB with Max A  in order to increase balance for ADLs. OT Short Term Goal 2 (Week 1): Pt will perform grooming with Max A in order to increase ADL performance. OT Short Term Goal 3 (Week 1): Pt will perform UB dressing with Max A in order to increase ADL performance. OT Short Term Goal 4 (Week 1): Pt will  engage in 5 minutes of functional activity before taking a rest break to increase endurance.   Skilled Therapeutic Interventions/Progress Updates:    Pt resting in bed upon arrival.  Initially attempted to engage patient in bathing and dressing tasks at bed level.  Pt incontinent of bowel (no diaper in place).  Tot A + 2 required to clean LB/perineal area.  Pt required tot A + 2 for rolling right and left in bed to facilitate cleansing.  Pt initiated raising Rt and Lt knee upon request but unable to complete task without assistance.  Pt required tot A + 2 to don pants.  HOB elevated and patient attempted washing face on request.  Pt initiated task but requried max verbal cues for redirection to task.  Assistance required for thoroughness.  Pt initiated threading LUE into shirt sleeve but unable to complete task.  Pt did not respond verbally throughout session.  Focus on activity tolerance, bed mobility, following one step commands, task initiation, attention to task, and safety awareness.  Therapy Documentation Precautions:  Precautions Precautions: Fall;Other (comment) Precaution Comments: PEG/abdominal precautions, trach, Bilateral thigh high TED hose  Restrictions Weight Bearing Restrictions: No Pain: Pain Assessment Pain Assessment: Faces Faces Pain Scale: No  hurt  See FIM for current functional status  Therapy/Group: Individual Therapy  Leroy Libman 08/04/2014, 10:00 AM

## 2014-08-05 ENCOUNTER — Inpatient Hospital Stay (HOSPITAL_COMMUNITY): Payer: Medicare Other | Admitting: Rehabilitation

## 2014-08-05 ENCOUNTER — Inpatient Hospital Stay (HOSPITAL_COMMUNITY): Payer: Medicare Other | Admitting: Speech Pathology

## 2014-08-05 ENCOUNTER — Inpatient Hospital Stay (HOSPITAL_COMMUNITY): Payer: Medicare Other | Admitting: Physical Therapy

## 2014-08-05 ENCOUNTER — Inpatient Hospital Stay (HOSPITAL_COMMUNITY): Payer: Medicare Other

## 2014-08-05 LAB — GLUCOSE, CAPILLARY
GLUCOSE-CAPILLARY: 137 mg/dL — AB (ref 70–99)
Glucose-Capillary: 100 mg/dL — ABNORMAL HIGH (ref 70–99)
Glucose-Capillary: 129 mg/dL — ABNORMAL HIGH (ref 70–99)
Glucose-Capillary: 143 mg/dL — ABNORMAL HIGH (ref 70–99)
Glucose-Capillary: 83 mg/dL (ref 70–99)

## 2014-08-05 NOTE — Progress Notes (Signed)
Physical Therapy Session Note  Patient Details  Name: Tony Davis MRN: [id number] Date of Birth: 05-30-1961  Today's Date: 08/05/2014 PT Co-Treatment Time: 1000-1030 (co-treatment with OT 1000-1100) PT Co-Treatment Time Calculation (min): 30 min (total 60 min)  Short Term Goals: Week 1:  PT Short Term Goal 1 (Week 1): Pt will transfer out of bed to w/c using maxi sky lift PT Short Term Goal 2 (Week 1): Pt will tolerate sitting up in tilt in space w/c for 2 hours. PT Short Term Goal 3 (Week 1): Pt will roll in bed req 1 person assist PT Short Term Goal 4 (Week 1): Pt will tolerate supine/side lie to sit transfer req 2 person assist.  PT Short Term Goal 5 (Week 1): Pt will demonstrate sustained focus of an activity for 30 seconds.   Skilled Therapeutic Interventions/Progress Updates:   Skilled co-treatment with OT focused on bed mobility, activity tolerance, following 1 step commands, initiation, and attention. Pt received semi reclined in bed and noted to be incontinent of bowel. Total A x 2 to clean lower body/perineal area. Pt able to initiate reaching toward opposite bed rails with increased time and max cues, max A x 2 for rolling R and L multiple times in order to clean patient. Patient able to initiate heel slide to lift R > L knee with commands but requires assist to complete task. Performed active assisted ROM/PROM to RLE and LLE while OT setting up for hygiene. Patient required +2 A to don SCDs and pants. Attempted supine bridging to assist in donning pants with LEs stabilized, no active movement noted. Pt transferred supine > sit with HOB elevated and bed rail, +2 assist. Patient performed lateral scoot transfer bed > w/c with +2 assist, pt able to initiate/assist with transfer less than 25% of time. Pt with oxygen saturation >92% on room air throughout session. Pt left seated in TIS, quick release belt on, and trach collar on 5L 02. Pt able to demonstrate use of call bell and call bell  placed in lap.   Therapy Documentation Precautions:  Precautions Precautions: Fall;Other (comment) Precaution Comments: PEG/abdominal precautions, trach, Bilateral thigh high TED hose  Restrictions Weight Bearing Restrictions: No Pain: Pain Assessment Pain Assessment: No/denies pain  See FIM for current functional status  Therapy/Group: Co-Treatment  Carney Living A 08/05/2014, 12:28 PM

## 2014-08-05 NOTE — Progress Notes (Addendum)
Physical Therapy Session Note (make up session)  Patient Details  Name: Tony Davis MRN: [id number] Date of Birth: [birth year]  Today's Date: 08/05/2014 PT Individual Time: 4827 (make up session)-1205 PT Individual Time Calculation (min): 35 min   Short Term Goals: Week 1:  PT Short Term Goal 1 (Week 1): Pt will transfer out of bed to w/c using maxi sky lift PT Short Term Goal 2 (Week 1): Pt will tolerate sitting up in tilt in space w/c for 2 hours. PT Short Term Goal 3 (Week 1): Pt will roll in bed req 1 person assist PT Short Term Goal 4 (Week 1): Pt will tolerate supine/side lie to sit transfer req 2 person assist.  PT Short Term Goal 5 (Week 1): Pt will demonstrate sustained focus of an activity for 30 seconds.   Skilled Therapeutic Interventions/Progress Updates:   Pt received sitting in w/c in room, agreeable to therapy, however VERY lethargic.  Assisted pt down to therapy gym with trach collar set on 6L O2 at total A level.  Once in therapy gym, attempted to increase pts arousal with cold washcloth on face, however while donning walking sling, pt continued to be very lethargic, therefore did not attempt standing for safety purposes.  Remainder of session focused on unsupported sitting while participating in activities to sustain attention, problem solve, and work on mental flexibility and use of RUE.  Had pt engage in game of tic tac toe.  Note pts initiation very good and he did grab pen and place "O" on grid.  He did intermittently lose attention with others in gym, but was easily re-directed back to task.  Also had pt write his first name, last name and where he is from.  Pt able to complete all tasks.  Assisted pt back to room and left in chair tilted.  SaO2 at 95% and HR to 102.  Nurse tech notified and all needs in reach.   Therapy Documentation Precautions:  Precautions Precautions: Fall;Other (comment) Precaution Comments: PEG/abdominal precautions, trach, Bilateral thigh  high TED hose  Restrictions Weight Bearing Restrictions: No   Pain: Pt shook head "no" when asked about pain.      See FIM for current functional status  Therapy/Group: Individual Therapy  [name] 08/05/2014, 12:24 PM

## 2014-08-05 NOTE — Progress Notes (Signed)
Suctioned pt's trach. Minimal secretions, and pt tolerated well .

## 2014-08-05 NOTE — Progress Notes (Signed)
Physical Therapy Session Note  Patient Details  Name: Tony Davis MRN: [id number] Date of Birth: 1961/07/11  Today's Date: 08/05/2014 PT Individual Time: 2505-3976 PT Individual Time Calculation (min): 60 min   Short Term Goals: Week 1:  PT Short Term Goal 1 (Week 1): Pt will transfer out of bed to w/c using maxi sky lift PT Short Term Goal 2 (Week 1): Pt will tolerate sitting up in tilt in space w/c for 2 hours. PT Short Term Goal 3 (Week 1): Pt will roll in bed req 1 person assist PT Short Term Goal 4 (Week 1): Pt will tolerate supine/side lie to sit transfer req 2 person assist.  PT Short Term Goal 5 (Week 1): Pt will demonstrate sustained focus of an activity for 30 seconds.   Skilled Therapeutic Interventions/Progress Updates:  Patient tilted back in wheelchair upon entering room. Nursing in to give tube feeding. Patient pushed in wheelchair to gym. Treatment session focused on attention, standing with [name], and cognition. Patient more alert this afternoon following Ritalin dose. Patient sit to stand and stood with [name] x 3 for 5 to 7 minutes each session. Attempted to get patient to take steps without success. Patient did weight shift side to side and occasionally unweight and shift feet. While standing, worked on hip and knee extension. Patient on 5L O2 at trach. O2 sats monitored throughout session and remained 93 - 95%. Patient unable to pick day of week or month out of 2 choices. Patient did vocalize in discussions about basketball and football. Patient left tilted in wheelchair with quick release belt in place and all items in reach.  Therapy Documentation Precautions:  Precautions Precautions: Fall;Other (comment) Precaution Comments: PEG/abdominal precautions, trach, Bilateral thigh high TED hose  Restrictions Weight Bearing Restrictions: No Pain: Pain Assessment Pain Assessment: No/denies pain Patient did report that he "hurt all over" after standing.  See  FIM for current functional status  Therapy/Group: Individual Therapy  Elder Love M 08/05/2014, 3:27 PM

## 2014-08-05 NOTE — Progress Notes (Signed)
The skilled treatment note has been reviewed and SLP is in agreement.  Jadarion Halbig, M.A., CCC-SLP  [phone]   

## 2014-08-05 NOTE — Progress Notes (Signed)
Speech Language Pathology Daily Session Note  Patient Details  Name: Tony Davis MRN: [id number] Date of Birth: [birth year]  Today's Date: 08/05/2014 SLP Individual Time: 1300-1400 SLP Individual Time Calculation (min): 60 min  Short Term Goals: Week 1: SLP Short Term Goal 1 (Week 1): Patient will initiate verbal response to yes/no and open-ended questions within 5-10 seconds with moderate verbal/tactile cues. SLP Short Term Goal 2 (Week 1): Patient will tolerate current diet with no overt s/s aspiration. SLP Short Term Goal 3 (Week 1): Patient will tolerate PMV placement for all waking hours with no significant changes in SpO2 saturation. SLP Short Term Goal 4 (Week 1): Patient will follow basic level, 1-step directions with 75% accuracy.  Skilled Therapeutic Interventions: Skilled treatment session focused on cognitive-linguistic and dysphagia goals. Upon entering the room, patient was asleep while upright in tilt-in-space wheelchair with PMSV in place. Vitals were [facility] and remained stable throughout the session. Student facilitated session by initially providing Mod A multimodal cues for arousal and focused attention due to lethargy. Patient required Min A multimodal cues for functional problem solving during oral care with suction toothbrush. Patient was administered trials of ice chips and thin liquids via teaspoon and demonstrated timely oral manipulation and swallow initiation (~3-6 secs) without overt s/s of aspiration; however, trials were ended due to decrease in patient's focused attention and lethargy with verbal cues needed to keep his eyes open and focus attention to task. Patient required extra time and overall Mod-Max A for verbal initiation to answer basic yes/no questions and questions in regards to patient orientation. Student provided Max-Total A verbal, visual and question cues for initiation and basic problem solving to sort by color from a field of two. Patient was left in  tilt-in-space wheelchair with quick release belt in place. Continue with current plan of care.   FIM:  Comprehension Comprehension Mode: Auditory Comprehension: 1-Understands basic less than 25% of the time/requires cueing 75% of the time Expression Expression Mode: Verbal Expression Assistive Devices: 6-Talk trach valve Expression: 1-Expresses basis less than 25% of the time/requires cueing greater than 75% of the time. Social Interaction Social Interaction: 1-Interacts appropriately less than 25% of the time. May be withdrawn or combative. Problem Solving Problem Solving: 1-Solves basic less than 25% of the time - needs direction nearly all the time or does not effectively solve problems and may need a restraint for safety Memory Memory: 1-Recognizes or recalls less than 25% of the time/requires cueing greater than 75% of the time FIM - Eating Eating Activity: 1: Helper feeds patient  Pain Pain Assessment Pain Assessment: No/denies pain  Therapy/Group: Individual Therapy  Naylin Burkle 08/05/2014, 3:43 PM

## 2014-08-05 NOTE — Progress Notes (Signed)
Occupational Therapy Session Note  Patient Details  Name: Tony Davis MRN: [id number] Date of Birth: Nov 28, 1961  Today's Date: 08/05/2014 OT Co-Treatment Time: 1030-1100 (Cotx with PT - total time 1000-1100) OT Co-Treatment Time Calculation (min): 30 min   Short Term Goals: Week 1:  OT Short Term Goal 1 (Week 1): Pt will perform dynamic sitting balance seated on EOB with Max A  in order to increase balance for ADLs. OT Short Term Goal 2 (Week 1): Pt will perform grooming with Max A in order to increase ADL performance. OT Short Term Goal 3 (Week 1): Pt will perform UB dressing with Max A in order to increase ADL performance. OT Short Term Goal 4 (Week 1): Pt will  engage in 5 minutes of functional activity before taking a rest break to increase endurance.   Skilled Therapeutic Interventions/Progress Updates:    Cotx with PT with focus on bed mobility, transfers, following one step commands, task initiation, sequencing, and arousal.  Pt incontinent of bowel and required tot A + 2 for hygiene and LB dressing.  Pt required tot A + 2 for rolling right and left in bed but did initiate task approx 50% of time.  Pt initiated supine->sit EOB but required tot A + 2 to perform task.  Pt performed scoot transfer to w/c with tot A +2 (pt=10%).  Pt initiated threading BUE into shirt sleeves but was unable to complete tasks and required assistance to complete donning of shirt.  Pt remained in w/c with QRB in place.  Pt return demonstrated use of call bell.  Therapy Documentation Precautions:  Precautions Precautions: Fall;Other (comment) Precaution Comments: PEG/abdominal precautions, trach, Bilateral thigh high TED hose  Restrictions Weight Bearing Restrictions: No General:   Vital Signs: Therapy Vitals Pulse Rate: 96 Resp: 18 Oxygen Therapy SpO2: 96 % O2 Device: Trach collar O2 Flow Rate (L/min): 5 L/min FiO2 (%): 28 % Pain:  Pt with no c/o pain but grimaced occasionally although he  continued to deny pain  See FIM for current functional status  Therapy/Group: Co-Treatment  Leroy Libman 08/05/2014, 11:57 AM

## 2014-08-05 NOTE — Progress Notes (Signed)
Subjective/Complaints:   No problems. Temp down. Tolerated trach change without issues Review of Systems - unable to obtain secondary to mental status Objective: Vital Signs: Blood pressure 131/72, pulse 93, temperature 98 F (36.7 C), temperature source Oral, resp. rate 17, weight 112.2 kg (247 lb 5.7 oz), SpO2 97.00%. No results found. Results for orders placed during the hospital encounter of 07/30/14 (from the past 72 hour(s))  GLUCOSE, CAPILLARY     Status: Abnormal   Collection Time    08/02/14 11:19 AM      Result Value Ref Range   Glucose-Capillary 105 (*) 70 - 99 mg/dL   Comment 1 Notify RN    GLUCOSE, CAPILLARY     Status: Abnormal   Collection Time    08/02/14  4:27 PM      Result Value Ref Range   Glucose-Capillary 128 (*) 70 - 99 mg/dL  GLUCOSE, CAPILLARY     Status: Abnormal   Collection Time    08/02/14 11:31 PM      Result Value Ref Range   Glucose-Capillary 146 (*) 70 - 99 mg/dL  URINALYSIS, ROUTINE W REFLEX MICROSCOPIC     Status: None   Collection Time    08/03/14 12:13 AM      Result Value Ref Range   Color, Urine YELLOW  YELLOW   APPearance CLEAR  CLEAR   Specific Gravity, Urine 1.020  1.005 - 1.030   pH 6.0  5.0 - 8.0   Glucose, UA NEGATIVE  NEGATIVE mg/dL   Hgb urine dipstick NEGATIVE  NEGATIVE   Bilirubin Urine NEGATIVE  NEGATIVE   Ketones, ur NEGATIVE  NEGATIVE mg/dL   Protein, ur NEGATIVE  NEGATIVE mg/dL   Urobilinogen, UA 0.2  0.0 - 1.0 mg/dL   Nitrite NEGATIVE  NEGATIVE   Leukocytes, UA NEGATIVE  NEGATIVE   Comment: MICROSCOPIC NOT DONE ON URINES WITH NEGATIVE PROTEIN, BLOOD, LEUKOCYTES, NITRITE, OR GLUCOSE <1000 mg/dL.  URINE CULTURE     Status: None   Collection Time    08/03/14 12:13 AM      Result Value Ref Range   Specimen Description URINE, CATHETERIZED     Special Requests NONE     Culture  Setup Time       Value: 08/03/2014 08:49     Performed at [facility] PENDING     Culture       Value: Culture  reincubated for better growth     Performed at [facility]   Report Status PENDING    GLUCOSE, CAPILLARY     Status: None   Collection Time    08/03/14  6:55 AM      Result Value Ref Range   Glucose-Capillary 88  70 - 99 mg/dL  GLUCOSE, CAPILLARY     Status: None   Collection Time    08/03/14  2:48 PM      Result Value Ref Range   Glucose-Capillary 89  70 - 99 mg/dL   Comment 1 Notify RN    GLUCOSE, CAPILLARY     Status: Abnormal   Collection Time    08/03/14  9:50 PM      Result Value Ref Range   Glucose-Capillary 137 (*) 70 - 99 mg/dL  GLUCOSE, CAPILLARY     Status: None   Collection Time    08/04/14  6:21 AM      Result Value Ref Range   Glucose-Capillary 91  70 - 99 mg/dL   Comment  1 Notify RN    GLUCOSE, CAPILLARY     Status: Abnormal   Collection Time    08/04/14 11:18 AM      Result Value Ref Range   Glucose-Capillary 134 (*) 70 - 99 mg/dL   Comment 1 Notify RN    GLUCOSE, CAPILLARY     Status: Abnormal   Collection Time    08/04/14  6:53 PM      Result Value Ref Range   Glucose-Capillary 106 (*) 70 - 99 mg/dL  GLUCOSE, CAPILLARY     Status: Abnormal   Collection Time    08/05/14 12:07 AM      Result Value Ref Range   Glucose-Capillary 100 (*) 70 - 99 mg/dL  GLUCOSE, CAPILLARY     Status: Abnormal   Collection Time    08/05/14  5:58 AM      Result Value Ref Range   Glucose-Capillary 129 (*) 70 - 99 mg/dL     HEENT: #4 shiley trach, site clean Cardio: RRR and no murmur Resp: CTA B/L and unlabored, clear. sats 98- 100% GI: BS positive and PEG site clean and NT Extremity:  No Edema Skin:   Intact and Other Trach and PEG site crusted aroung stoma Neuro: more alert. Initiates more Abnormal Sensory reduced on left difficult to assess secondary to MS, Abnormal Motor 4- LUE, 3- RLE, 2- LLE, 5/5 RUE? Moves RLE to pain., Dysarthric and Aphasic. Makes eye contact. Nods y/n Musc/Skel:  Normal GEN NAD   Assessment/Plan: 1. Functional deficits secondary  to left Thalamic ICH, Hydrocephalus with Right  shunt which require 3+ hours per day of interdisciplinary therapy in a comprehensive inpatient rehab setting. Physiatrist is providing close team supervision and 24 hour management of active medical problems listed below. Physiatrist and rehab team continue to assess barriers to discharge/monitor patient progress toward functional and medical goals.  FIM: FIM - Bathing Bathing Steps Patient Completed: Abdomen Bathing: 1: Total-Patient completes 0-2 of 10 parts or less than 25%  FIM - Upper Body Dressing/Undressing Upper body dressing/undressing steps patient completed: Thread/unthread left sleeve of pullover shirt/dress Upper body dressing/undressing: 1: Total-Patient completed less than 25% of tasks FIM - Lower Body Dressing/Undressing Lower body dressing/undressing: 1: Two helpers  FIM - Toileting Toileting: 0: No continent bowel/bladder events this shift  FIM - Air cabin crew Transfers: 0-Activity did not occur  FIM - Control and instrumentation engineer Devices: Arm rests;Orthosis Clarise Cruz +) Bed/Chair Transfer: 1: Two helpers;2: Sit > Supine: Max A (lifting assist/Pt. 25-49%)  FIM - Locomotion: Wheelchair Locomotion: Wheelchair: 1: Total Assistance/staff pushes wheelchair (Pt<25%) FIM - Locomotion: Ambulation Locomotion: Ambulation Assistive Devices: Sara Plus Ambulation/Gait Assistance: 1: +2 Total assist Locomotion: Ambulation: 1: Two helpers  Comprehension Comprehension Mode: Auditory Comprehension: 1-Understands basic less than 25% of the time/requires cueing 75% of the time  Expression Expression Mode: Nonverbal;Asleep Expression Assistive Devices: 6-Talk trach valve Expression: 1-Expresses basis less than 25% of the time/requires cueing greater than 75% of the time.  Social Interaction Social Interaction Mode: Asleep Social Interaction: 1-Interacts appropriately less than 25% of the time. May be  withdrawn or combative.  Problem Solving Problem Solving Mode: Asleep Problem Solving: 1-Solves basic less than 25% of the time - needs direction nearly all the time or does not effectively solve problems and may need a restraint for safety  Memory Memory Mode: Asleep Memory: 1-Recognizes or recalls less than 25% of the time/requires cueing greater than 75% of the time Medical Problem List and Plan:  1. Functional deficits secondary to Left thalamic bleed with hydrocephalus.  2. DVT Prophylaxis/Anticoagulation: Pharmaceutical: Lovenox- 3. Chronic B-knee/Pain Management: Prn oxycodone.   Voltaren gel for knees.  -poorly-compliant pain patient in my practice 4. Mood: Monitor as mentation improves.  5. Neuropsych: This patient is not capable of making decisions on his own behalf.  6. Skin/Wound Care:   Continue air mattress overlay.  .  7. DM type 2: Will monitor BS with ac/hs checks. Continue Levemir bid and adjust as indicated.  -reasonable control still  8. Enterobacter tracheobronchitis: Continue Levaquin through today  -downsized trach to #4 without any issues yesterday 7. HTN/CAD: continue Norvasc daily and metoprolol bid.  8. ABLA: on iron supplement.   9.  Low grade fever temp--likely atelectasis  -ua neg, culture still pending  LOS (Days) 6 A FACE TO FACE EVALUATION WAS PERFORMED  Brytni Dray T 08/05/2014, 7:49 AM

## 2014-08-05 NOTE — Progress Notes (Signed)
Social Work Patient ID: Tony Davis, male   DOB: 08/25/1961, 53 y.o.   MRN: [id number]  Have left conference information for pt's wife on her VM (per her request as she is at work.)  She will follow up with me on any concerns/ questions.  Have informed of targeted LOS of 4 weeks (approx 3 remaining) so she can coordinate plans with family [place] of [place].  Will continue to follow for d/c planning and support needs.  Etheleen Valtierra, LCSW

## 2014-08-06 ENCOUNTER — Inpatient Hospital Stay (HOSPITAL_COMMUNITY): Payer: Medicare Other

## 2014-08-06 ENCOUNTER — Inpatient Hospital Stay (HOSPITAL_COMMUNITY): Payer: Medicare Other | Admitting: Speech Pathology

## 2014-08-06 ENCOUNTER — Inpatient Hospital Stay (HOSPITAL_COMMUNITY): Payer: Medicare Other | Admitting: Occupational Therapy

## 2014-08-06 LAB — URINE CULTURE

## 2014-08-06 LAB — GLUCOSE, CAPILLARY
GLUCOSE-CAPILLARY: 142 mg/dL — AB (ref 70–99)
GLUCOSE-CAPILLARY: 125 mg/dL — AB (ref 70–99)
GLUCOSE-CAPILLARY: 90 mg/dL (ref 70–99)

## 2014-08-06 NOTE — Progress Notes (Signed)
Physical Therapy Weekly Progress Note  Patient Details  Name: Tony Davis MRN: [id number] Date of Birth: 1961-08-31  Beginning of progress report period: July 31, 2014 End of progress report period: August 06, 2014  Pt continues to make very slow progress and has recently achieved 3/5 STGs, see details below. Pt currently req overall Ax2 persons for bed mobility, transfers and ambulation up to 100' w/ use of lift equipment, total Ax1 person for w/c propulsion and close(S)-total Ax1 person for sitting balance. Pt req max multimodal cues for alertness/arousal, initiation and focused attention up to 10sec and total A for orientation.   Patient continues to demonstrate the following deficits: decreased activity tolerance, decreased strength, decreased balance, decreased postural control decreased orientation, decreased initiation, decreased memory, decreased problem solving, decreased awareness, decreased attention and therefore will continue to benefit from skilled PT intervention to enhance overall performance with activity tolerance, balance, postural control, functional use of  right upper extremity, right lower extremity, left upper extremity and left lower extremity, attention, awareness and coordination.  Patient progressing toward long term goals. Continue plan of care.  PT Short Term Goals Week 1:  PT Short Term Goal 1 (Week 1): Pt will transfer out of bed to w/c using maxi sky lift PT Short Term Goal 1 - Progress (Week 1): Met PT Short Term Goal 2 (Week 1): Pt will tolerate sitting up in tilt in space w/c for 2 hours. PT Short Term Goal 2 - Progress (Week 1): Met PT Short Term Goal 3 (Week 1): Pt will roll in bed req 1 person assist PT Short Term Goal 3 - Progress (Week 1): Progressing toward goal PT Short Term Goal 4 (Week 1): Pt will tolerate supine/side lie to sit transfer req 2 person assist.  PT Short Term Goal 4 - Progress (Week 1): Met PT Short Term Goal 5 (Week 1): Pt will  demonstrate sustained focus of an activity for 30 seconds.  PT Short Term Goal 5 - Progress (Week 1): Not met Week 2:  PT Short Term Goal 1 (Week 2): Pt will demonstrate focused attention to therapeutic task for 30 seconds w/ max multimodal cues PT Short Term Goal 2 (Week 2): Pt to perform bed mobility w/ max Ax1person PT Short Term Goal 3 (Week 2): Pt to transfer bed<>w/c with Ax2 persons without use of lift equipment PT Short Term Goal 4 (Week 2): Pt to demonstrate sitting balance with min guard A, 50% of time PT Short Term Goal 5 (Week 2): Pt will ambulate 100' w/ Ax2 persons  Therapy Documentation Precautions:  Precautions Precautions: Fall;Other (comment) Precaution Comments: PEG/abdominal precautions, trach, Bilateral thigh high TED hose  Restrictions Weight Bearing Restrictions: No  See FIM for current functional status  Gilmore Laroche 08/06/2014, 7:40 AM

## 2014-08-06 NOTE — Progress Notes (Signed)
Subjective/Complaints:   Uneventful evening. No pain. More alert at times. Review of Systems - unable to obtain secondary to mental status Objective: Vital Signs: Blood pressure 137/82, pulse 88, temperature 98.8 F (37.1 C), temperature source Oral, resp. rate 18, weight 112.2 kg (247 lb 5.7 oz), SpO2 96.00%. No results found. Results for orders placed during the hospital encounter of 07/30/14 (from the past 72 hour(s))  GLUCOSE, CAPILLARY     Status: None   Collection Time    08/03/14  2:48 PM      Result Value Ref Range   Glucose-Capillary 89  70 - 99 mg/dL   Comment 1 Notify RN    GLUCOSE, CAPILLARY     Status: Abnormal   Collection Time    08/03/14  9:50 PM      Result Value Ref Range   Glucose-Capillary 137 (*) 70 - 99 mg/dL  GLUCOSE, CAPILLARY     Status: None   Collection Time    08/04/14  6:21 AM      Result Value Ref Range   Glucose-Capillary 91  70 - 99 mg/dL   Comment 1 Notify RN    GLUCOSE, CAPILLARY     Status: Abnormal   Collection Time    08/04/14 11:18 AM      Result Value Ref Range   Glucose-Capillary 134 (*) 70 - 99 mg/dL   Comment 1 Notify RN    GLUCOSE, CAPILLARY     Status: Abnormal   Collection Time    08/04/14  6:53 PM      Result Value Ref Range   Glucose-Capillary 106 (*) 70 - 99 mg/dL  GLUCOSE, CAPILLARY     Status: Abnormal   Collection Time    08/05/14 12:07 AM      Result Value Ref Range   Glucose-Capillary 100 (*) 70 - 99 mg/dL  GLUCOSE, CAPILLARY     Status: Abnormal   Collection Time    08/05/14  5:58 AM      Result Value Ref Range   Glucose-Capillary 129 (*) 70 - 99 mg/dL  GLUCOSE, CAPILLARY     Status: None   Collection Time    08/05/14  1:56 PM      Result Value Ref Range   Glucose-Capillary 83  70 - 99 mg/dL   Comment 1 Notify RN    GLUCOSE, CAPILLARY     Status: Abnormal   Collection Time    08/05/14  4:36 PM      Result Value Ref Range   Glucose-Capillary 137 (*) 70 - 99 mg/dL  GLUCOSE, CAPILLARY     Status: Abnormal    Collection Time    08/05/14  8:58 PM      Result Value Ref Range   Glucose-Capillary 143 (*) 70 - 99 mg/dL  GLUCOSE, CAPILLARY     Status: Abnormal   Collection Time    08/06/14  6:43 AM      Result Value Ref Range   Glucose-Capillary 142 (*) 70 - 99 mg/dL     HEENT: #4 shiley trach, site clean Cardio: RRR and no murmur Resp: CTA B/L and unlabored, clear.  GI: BS positive and PEG site clean. Extremity:  No Edema Skin:   Intact other than above Neuro: generally more alert. Initiates more Abnormal Sensory reduced on left difficult to assess secondary to MS, Abnormal Motor 4- LUE, 3- RLE, 2- LLE, 5/5 RUE? Moves RLE to pain., Dysarthric and Aphasic. Makes eye contact. Nods y/n Musc/Skel:  Normal GEN  NAD   Assessment/Plan: 1. Functional deficits secondary to left Thalamic ICH, Hydrocephalus with Right  shunt which require 3+ hours per day of interdisciplinary therapy in a comprehensive inpatient rehab setting. Physiatrist is providing close team supervision and 24 hour management of active medical problems listed below. Physiatrist and rehab team continue to assess barriers to discharge/monitor patient progress toward functional and medical goals.  FIM: FIM - Bathing Bathing Steps Patient Completed: Abdomen Bathing: 0: Activity did not occur  FIM - Upper Body Dressing/Undressing Upper body dressing/undressing steps patient completed: Thread/unthread left sleeve of pullover shirt/dress Upper body dressing/undressing: 1: Total-Patient completed less than 25% of tasks FIM - Lower Body Dressing/Undressing Lower body dressing/undressing: 1: Two helpers  FIM - Toileting Toileting: 0: No continent bowel/bladder events this shift  FIM - Air cabin crew Transfers: 0-Activity did not occur  FIM - Control and instrumentation engineer Devices: HOB elevated;Bed rails;Arm rests Bed/Chair Transfer: 1: Two helpers  FIM - Locomotion: Wheelchair Locomotion: Wheelchair:  1: Total Assistance/staff pushes wheelchair (Pt<25%) FIM - Locomotion: Ambulation Locomotion: Ambulation Assistive Devices: Sara Plus Ambulation/Gait Assistance: 1: +2 Total assist Locomotion: Ambulation: 0: Activity did not occur  Comprehension Comprehension Mode: Auditory Comprehension: 1-Understands basic less than 25% of the time/requires cueing 75% of the time  Expression Expression Mode: Verbal Expression Assistive Devices: 6-Talk trach valve Expression: 1-Expresses basis less than 25% of the time/requires cueing greater than 75% of the time.  Social Interaction Social Interaction Mode: Asleep Social Interaction: 1-Interacts appropriately less than 25% of the time. May be withdrawn or combative.  Problem Solving Problem Solving Mode: Asleep Problem Solving: 1-Solves basic less than 25% of the time - needs direction nearly all the time or does not effectively solve problems and may need a restraint for safety  Memory Memory Mode: Asleep Memory: 1-Recognizes or recalls less than 25% of the time/requires cueing greater than 75% of the time Medical Problem List and Plan:  1. Functional deficits secondary to Left thalamic bleed with hydrocephalus.  2. DVT Prophylaxis/Anticoagulation: Pharmaceutical: Lovenox- 3. Chronic B-knee/Pain Management: Prn oxycodone.   Voltaren gel for knees.  -poorly-compliant pain patient in my practice 4. Mood: Monitor as mentation improves.  5. Neuropsych: This patient is not capable of making decisions on his own behalf.   -continue low dose ritalin---really don't want to push higher given CV history 6. Skin/Wound Care:   Continue air mattress overlay.  .  7. DM type 2: Will monitor BS with ac/hs checks. Continue Levemir bid and adjust as indicated.  -reasonable control still  8. Enterobacter tracheobronchitis: Continue Levaquin through today  -downsized trach to #4 without any issues--consider decannulation next week 7. HTN/CAD: continue Norvasc  daily and metoprolol bid.  8. ABLA: on iron supplement.   9.  Low grade fever temp--likely atelectasis  -ua neg, culture only 15k staph  LOS (Days) 7 A FACE TO FACE EVALUATION WAS PERFORMED  Sevon Rotert T 08/06/2014, 8:06 AM

## 2014-08-06 NOTE — Progress Notes (Signed)
Occupational Therapy Session Note  Patient Details  Name: Tony Davis MRN: [id number] Date of Birth: 02-08-1961  Today's Date: 08/06/2014 OT Individual Time: 1400-1430 OT Individual Time Calculation (min): 30 min    Short Term Goals: Week 1:  OT Short Term Goal 1 (Week 1): Pt will perform dynamic sitting balance seated on EOB with Max A  in order to increase balance for ADLs. OT Short Term Goal 2 (Week 1): Pt will perform grooming with Max A in order to increase ADL performance. OT Short Term Goal 3 (Week 1): Pt will perform UB dressing with Max A in order to increase ADL performance. OT Short Term Goal 4 (Week 1): Pt will  engage in 5 minutes of functional activity before taking a rest break to increase endurance.   Skilled Therapeutic Interventions/Progress Updates:  Co treatment with PT secondary to pt being + 2 for assistance and safety. Pt with c/o, signs, or symptoms of pain during session. Upon entering the room, pt supine in bed with nurse tech checking blood sugar. Session with focus on 1 step commands, initiation of tasks, functional transfers, and sitting balance. Pt required total A of 2 for supine to sit with pt able to follow verbal cue to move LEs towards therapist. Pt requiring assistance with trunk , BLEs, and to scoot to EOB. Pt assisted into [facility] for ambulation ~80 feet. Pt assisted by therapist to gym in wheelchair. Total A of 2 for scoot transfer to matt towards L side. Sitting balance seated edge of mat was close S - Min A during balance activity of throwing/bouncing ball back and forth to therapist with B UEs. Pt able to count reps of 1-10 easily and requiring verbal guidance cues to verbalize 13 reps. Pt became very distracted by environment. On second attempt, pt counting to 5 with each rep requiring increased time of ~ 30 seconds to respond with max verbal cues required. Pt requiring increased time to respond during session to questions and commands with time  increase as pt fatigued and became more easily distracted. Pt having incontinent of bowel episode at end of session and returned to room and transferred to bed with total A of 2 with nurse tech present to provide assistance at therapist exit.  Therapy Documentation Precautions:  Precautions Precautions: Fall;Other (comment) Precaution Comments: PEG/abdominal precautions, trach, Bilateral thigh high TED hose  Restrictions Weight Bearing Restrictions: No Vital Signs: Pt engaged in session without O2 on and stats remaining within 94-98%.  Pain: Pain Assessment Pain Assessment: No/denies pain Pt with no signs or symptoms of pain this session.   See FIM for current functional status  Therapy/Group: Individual Therapy  [name] 08/06/2014, 5:01 PM

## 2014-08-06 NOTE — Progress Notes (Signed)
Chart and note reviewed. Agree with note.  Mari Battaglia, RD, LDN, CNSC Pager [phone] After Hours Pager [phone]   

## 2014-08-06 NOTE — Progress Notes (Signed)
Occupational Therapy Session Note  Patient Details  Name: Tony Davis MRN: [id number] Date of Birth: [birth year]  Today's Date: 08/06/2014 OT Individual Time: 0800-0900 OT Individual Time Calculation (min): 60 min    Short Term Goals: Week 1:  OT Short Term Goal 1 (Week 1): Pt will perform dynamic sitting balance seated on EOB with Max A  in order to increase balance for ADLs. OT Short Term Goal 2 (Week 1): Pt will perform grooming with Max A in order to increase ADL performance. OT Short Term Goal 3 (Week 1): Pt will perform UB dressing with Max A in order to increase ADL performance. OT Short Term Goal 4 (Week 1): Pt will  engage in 5 minutes of functional activity before taking a rest break to increase endurance.   Skilled Therapeutic Interventions/Progress Updates:    Pt alert and resting in bed.  Focus on following one step commands, task initiation, attention to task, bed mobility, transfers, and activity tolerance.  Pt required extra time to respond to questions and commands.  Pt oriented to hospital but not name of hospital.  Pt not oriented to city or date.  Pt required max verbal cues throughout session to use voice when responding to questions.  Pt initiated tasks with extra time to process but required [facility] assist to complete tasks.  Pt appeared to be internally distracted throughout session.  Pt did thread BUE into shirt sleeves this morning but required assistance to pull sleeves up past elbows.  Pt required tot A + 2 for scoot transfer to w/c.  Pt repeated the phrase "I feel good" in a normal and clear voice when requested at end of session.  When asked to start using his voice patient responded "I will."  Therapy Documentation Precautions:  Precautions Precautions: Fall;Other (comment) Precaution Comments: PEG/abdominal precautions, trach, Bilateral thigh high TED hose  Restrictions Weight Bearing Restrictions: No Pain: Pain Assessment Pain Assessment: Faces Pain Score:  0-No pain  See FIM for current functional status  Therapy/Group: Individual Therapy  Leroy Libman 08/06/2014, 10:35 AM

## 2014-08-06 NOTE — Progress Notes (Signed)
NUTRITION FOLLOW-UP  DOCUMENTATION CODES Per approved criteria  -Obesity Unspecified   INTERVENTION: -Continue bolus tube feeding of Vital 1.5 formula at 450 ml TID with 30 ml Prostat once daily per PEG to provide 2125 kcal, 106 grams of protein, and 1026 ml of H2O.   -Continue free water flushes of 200 ml 5 times daily.  -Tube feeding regimen provides 100% of estimated nutrition needs.  -Will continue to monitor  NUTRITION Dx: Inadequate oral intake related to inability to eat as evidenced by NPO status; Ongoing  Goal: Pt to meet >/= 90% of their estimated nutrition needs, met  Monitor:  TF tolerance, labs, weights, I/O's  53 y.o. male  Admitting Dx: Basal Ganglia Hemorrhage  ASSESSMENT: [name] is a 53 year old male with h/o DM type 2, CAD, OSA, chronic pain bilateral knees, CVA '11, PFO-on chronic coumadin who was admitted to OSH on 06/13/14 with left basal ganglia hemorrhage with intraventricular extension and hydrocephalus requiring right crani for evacuation as well as VP shunt placement Hospital course complicated by acute respiratory failure requiring tracheostomy as well as PEG placement, BLE DVT requiring IVC placement as well as fevers due to enterobacter PNA. Patient with subsequent right hemiparesis, lethargy, nonverbal, requiring noxious stimuli for arousal, difficulty tracking, dysconjugate gaze as well as difficulty following commands  8/22-Pt endorsed decreased appetite, however was unable to determine when decreased PO intake began -Is currently receiving feeding assistance, RD viewed breakfast tray with <25% completion. Pt had only consumed <50% of [employer] protein supplement -Weight appears to have maintained stable per previous medical records, fluctuates between 270-278 lbs in past 3 months (2.8% body weight, non-severe for time frame) -Receiving Vital 1.5 at nocturnal feeds of 6pm-6am at 65 ml/hr w/Pro-stat TID. MD requesting advancing to bolus feeds  until PO intake improves -Modified regimen to Vital 1.5 at 250 ml TID between meals  8/24-Dietitian was notified for initiation of full bolus tube feedings. Spoke with RN, pt has been tolerating the bolus tube feeding. Spoke with pt, pt reports having stomach pains, which had started yesterday.  -Pt was receiving bolus tube feeding:Vital 1.5 @ 250 ml TID with 30 ml Prostat via PEG TID. Tube feeding regimen provides 1425 kcal (70% of needs), 96 grams of protein (96% est protein needs), and 573 ml of H2O.  -Increased orders to Vital 1.5 to 450 ml TID and provide 30 ml Prostat per PEG once daily to provide 2125 kcal, 106 grams of protein, and 1026 ml of H2O.    8/25- Spoke with RN, she reports pt has been tolerating the bolus tube feeding well. Pt did have two large, loose bowel movements last night, however had not had one today. Spoke with pt, pt reports he continues to have abdominal pains. Pains however were not serious. Spoke with PA about current TF regimen, discussed on continuing with current orders.  8/28- Pt reports he no longer has abdominal pains. Per RN, pt has been tolerating his tube feeding well.  Pt is receiving bolus tube feedings of Vital 1.5 formula at 450 ml TID with 30 ml Prostat once daily per PEG providing 2125 kcal, 106 grams of protein, and 1026 ml of H2O.   Height: Ht Readings from Last 1 Encounters:  07/30/14 '5\' 11"'  (1.803 m)   Weight: Wt Readings from Last 1 Encounters:  08/04/14 247 lb 5.7 oz (112.2 kg)   BMI:  Body mass index is 34.51 kg/(m^2). Class I obesity  Re-Estimated Nutritional Needs: Kcal: 2000-2200 Protein: 100-110  gram Fluid: >/=2000 ml/daily  Skin:+ 2 gen edema  Diet Order: NPO   Intake/Output Summary (Last 24 hours) at 08/06/14 1441 Last data filed at 08/06/14 0611  Gross per 24 hour  Intake    650 ml  Output    300 ml  Net    350 ml    Last BM: 8/26  Labs:   Recent Labs Lab 08/02/14 0635  NA 147  K 3.6*  CL 105  CO2 30   BUN 12  CREATININE 0.97  CALCIUM 9.5  GLUCOSE 79    CBG (last 3)   Recent Labs  08/05/14 2058 08/06/14 0643 08/06/14 1404  GLUCAP 143* 142* 125*    Scheduled Meds: . amLODipine  5 mg Oral Daily  . antiseptic oral rinse  7 mL Mouth Rinse q12n4p  . atorvastatin  20 mg Oral q1800  . chlorhexidine  15 mL Mouth Rinse BID  . diclofenac sodium  2 g Topical QID  . enoxaparin (LOVENOX) injection  40 mg Subcutaneous Q24H  . famotidine  20 mg Oral Daily  . feeding supplement (PRO-STAT SUGAR FREE 64)  30 mL Per Tube Q1200  . feeding supplement (VITAL 1.5 CAL)  450 mL Per Tube TID  . ferrous sulfate  300 mg Per Tube BID WC  . free water  200 mL Per Tube 5 X Daily  . insulin aspart  0-15 Units Subcutaneous TID WC  . insulin detemir  55 Units Subcutaneous BID  . methylphenidate  5 mg Per Tube BID  . metoprolol tartrate  75 mg Oral BID  . saccharomyces boulardii  250 mg Oral BID    Continuous Infusions:   Past Medical History  Diagnosis Date  . GERD (gastroesophageal reflux disease)   . HLD (hyperlipidemia)   . HTN (hypertension)   . Hematuria 8/12  . PFO (patent foramen ovale)     echo 8/12: EF 55-65%, grade 2 diast dysfnx; +PFO on bubble study;  TEE 4/13: EF normal, atrial septum with suspicion for interatrial septum fenestrations without flow across and few large bubbles noted in LA.  This was not felt to require closure   . CAD (coronary artery disease)     a. s/p promus DES circumflex artery 10/13/10. b. Cath 04/2013: stable disease but possible small vessel disease -Imdur added.  Marland Kitchen DVT (deep venous thrombosis) 2013    "right"   . TIA (transient ischemic attack)     "1-2; both after the one in 02/2010" (09/25/2013)  . OSA on CPAP   . Type II diabetes mellitus     June 2014 AIC 6.2%/notes 09/24/2013; pt denies this hx on 09/25/2013  . Arthritis     "knees" (09/25/2013)  . Rheumatoid arthritis(714.0)   . CVA (cerebral vascular accident) 02/2010    "lost peripheral vision  when I had the stroke; some memory issues since" (09/25/2013)    Past Surgical History  Procedure Laterality Date  . Knee arthroscopy Bilateral 2000's    2 on left and 3 on rt  . Tee without cardioversion  03/21/2012    Procedure: TRANSESOPHAGEAL ECHOCARDIOGRAM (TEE);  Surgeon: Fay Records, MD;  Location[facility] ENDOSCOPY;  Service: Cardiovascular;  Laterality: N/A;  . Coronary angioplasty with stent placement  2012    "1" (09/24/2013)   Kallie Locks, MS, Provisional LDN Pager # [phone] After hours/ weekend pager # [phone]

## 2014-08-06 NOTE — Progress Notes (Signed)
Speech Language Pathology Daily Session Note  Patient Details  Name: Tony Davis MRN: [id number] Date of Birth: 1960/12/12  Today's Date: 08/06/2014 SLP Individual Time: 0900-1000 SLP Individual Time Calculation (min): 60 min  Short Term Goals: Week 1: SLP Short Term Goal 1 (Week 1): Patient will initiate verbal response to yes/no and open-ended questions within 5-10 seconds with moderate verbal/tactile cues. SLP Short Term Goal 2 (Week 1): Patient will tolerate current diet with no overt s/s aspiration. SLP Short Term Goal 3 (Week 1): Patient will tolerate PMV placement for all waking hours with no significant changes in SpO2 saturation. SLP Short Term Goal 4 (Week 1): Patient will follow basic level, 1-step directions with 75% accuracy.  Skilled Therapeutic Interventions: Skilled treatment session focused on cognitive-linguistic and dysphagia goals. Upon entering the room, patient was awake while upright in tilt-in-space wheelchair with PMSV in place. Vitals were [facility] and remained stable throughout the session. SLP facilitated session by providing oral care with suction toothbrush prior to trials. Patient self-fed trials of ice chips and puree textures without overt s/s of aspiration but demonstrated a suspected delayed swallow initiation which is further impacted by decreased focused/sustained attention. Patient also consumed trial of thin liquid via cup but would not initiate a swallow and eventually orally expectorated the water, unsure of reason why.  Trials were ended due to fatigue and lethargy. Pt verbally responded to ~20% of clinician questions throughout the session and initiated 1 step commands with extra time and Max A multimodal cues. Pt also identified functional items (by grabbing) in a mildly distracting environment with 90% accuracy and maintained sustained attention to self-care task of brushing his hair for 60 seconds. Patient left in room with quick release belt in place and  all needs within reach. Continue with current plan of care.    FIM:  Comprehension Comprehension Mode: Auditory Comprehension: 1-Understands basic less than 25% of the time/requires cueing 75% of the time Expression Expression Mode: Verbal Expression Assistive Devices: 6-Talk trach valve Expression: 1-Expresses basis less than 25% of the time/requires cueing greater than 75% of the time. Social Interaction Social Interaction: 1-Interacts appropriately less than 25% of the time. May be withdrawn or combative. Problem Solving Problem Solving: 1-Solves basic less than 25% of the time - needs direction nearly all the time or does not effectively solve problems and may need a restraint for safety Memory Memory: 1-Recognizes or recalls less than 25% of the time/requires cueing greater than 75% of the time FIM - Eating Eating Activity: 1: Helper performs IV, parenteral, or tube feeding  Pain Pain Assessment Pain Assessment: No/denies pain  Therapy/Group: Individual Therapy  Sanders Manninen 08/06/2014, 3:36 PM

## 2014-08-06 NOTE — Progress Notes (Signed)
Physical Therapy Session Note  Patient Details  Name: Tony Davis MRN: [id number] Date of Birth: [birth year]  Today's Date: 08/06/2014 PT Co-Treatment Time: 1430-1500 PT Co-Treatment Time Calculation (min): 30 min  Short Term Goals: Week 2:  PT Short Term Goal 1 (Week 2): Pt will demonstrate focused attention to therapeutic task for 30 seconds w/ max multimodal cues PT Short Term Goal 2 (Week 2): Pt to perform bed mobility w/ max Ax1person PT Short Term Goal 3 (Week 2): Pt to transfer bed<>w/c with Ax2 persons without use of lift equipment PT Short Term Goal 4 (Week 2): Pt to demonstrate sitting balance with min guard A, 50% of time PT Short Term Goal 5 (Week 2): Pt will ambulate 100' w/ Ax2 persons  Skilled Therapeutic Interventions/Progress Updates:  Co-tx with OT this session for focus on alertness/arousal, activity tolerance, focused attention, initiation, following simple step commands, B UE coordination, functional transfers and gait training. Pt req max multimodal cues throughout tx session for focused attention and initiation. Pt following simple commands approx. 20% of time w/ repeated cues. Pt req Ax2 persons for t/f sup>sit and squat pivot t/f w/c<>tx mat. Use of Clarise Cruz + for initial t/f sit>stand progressing to ambulation w/ use of walking sling. OT person facilitating pace by guiding Clarise Cruz and PT facilitating overall postural control due to trunk flexion as well as advancement of R LE due to decreased clearance, narrow BOS and lateral foot strike. Overall, pt amb 75'x1 w/ Ax2 persons. Pt in therapy gym for increased external stimulation to promote overall alertness/arousal. Pt engaged in seated ball toss to engage B UE with max cues for focused attention on counting up to 15 tosses 1x and total cues for counting up to 5 tosses 2x. Therapy gym eventually becoming too distracting for continuation of task. Pt appeared to be incontinent, transported back to room and returned to supine  position for nurse tech to take over.   Therapy Documentation Precautions:  Precautions Precautions: Fall;Other (comment) Precaution Comments: PEG/abdominal precautions, trach, Bilateral thigh high TED hose  Restrictions Weight Bearing Restrictions: No  See FIM for current functional status  Therapy/Group: Co-Treatment  Otis Brace S 08/06/2014, 5:17 PM

## 2014-08-07 ENCOUNTER — Inpatient Hospital Stay (HOSPITAL_COMMUNITY): Payer: Medicare Other | Admitting: Speech Pathology

## 2014-08-07 ENCOUNTER — Inpatient Hospital Stay (HOSPITAL_COMMUNITY): Payer: Medicare Other | Admitting: Occupational Therapy

## 2014-08-07 DIAGNOSIS — I619 Nontraumatic intracerebral hemorrhage, unspecified: Secondary | ICD-10-CM

## 2014-08-07 DIAGNOSIS — Z5189 Encounter for other specified aftercare: Principal | ICD-10-CM

## 2014-08-07 DIAGNOSIS — I10 Essential (primary) hypertension: Secondary | ICD-10-CM

## 2014-08-07 DIAGNOSIS — I2510 Atherosclerotic heart disease of native coronary artery without angina pectoris: Secondary | ICD-10-CM

## 2014-08-07 LAB — GLUCOSE, CAPILLARY
GLUCOSE-CAPILLARY: 90 mg/dL (ref 70–99)
GLUCOSE-CAPILLARY: 138 mg/dL — AB (ref 70–99)
Glucose-Capillary: 90 mg/dL (ref 70–99)

## 2014-08-07 NOTE — Progress Notes (Signed)
Patient ID: Tony Davis, male   DOB: [birth year], 53 y.o.   MRN: [id number]  08/07/14.  Subjective/Complaints:  53 y/o admit with functional deficits secondary to left Thalamic ICH, Hydrocephalus with Right  Shunt  Past Medical History  Diagnosis Date  . GERD (gastroesophageal reflux disease)   . HLD (hyperlipidemia)   . HTN (hypertension)   . Hematuria 8/12  . PFO (patent foramen ovale)     echo 8/12: EF 55-65%, grade 2 diast dysfnx; +PFO on bubble study;  TEE 4/13: EF normal, atrial septum with suspicion for interatrial septum fenestrations without flow across and few large bubbles noted in LA.  This was not felt to require closure   . CAD (coronary artery disease)     a. s/p promus DES circumflex artery 10/13/10. b. Cath 04/2013: stable disease but possible small vessel disease -Imdur added.  Marland Kitchen DVT (deep venous thrombosis) 2013    "right"   . TIA (transient ischemic attack)     "1-2; both after the one in 02/2010" (09/25/2013)  . OSA on CPAP   . Type II diabetes mellitus     June 2014 AIC 6.2%/notes 09/24/2013; pt denies this hx on 09/25/2013  . Arthritis     "knees" (09/25/2013)  . Rheumatoid arthritis(714.0)   . CVA (cerebral vascular accident) 02/2010    "lost peripheral vision when I had the stroke; some memory issues since" (09/25/2013)      Uneventful evening. No pain. More alert at times. Review of Systems - unable to obtain secondary to mental status  Objective: Vital Signs: Blood pressure 134/89, pulse 95, temperature 98.7 F (37.1 C), temperature source Axillary, resp. rate 18, weight 112.2 kg (247 lb 5.7 oz), SpO2 95.00%. No results found. Results for orders placed during the hospital encounter of 07/30/14 (from the past 72 hour(s))  GLUCOSE, CAPILLARY     Status: Abnormal   Collection Time    08/04/14 11:18 AM      Result Value Ref Range   Glucose-Capillary 134 (*) 70 - 99 mg/dL   Comment 1 Notify RN    GLUCOSE, CAPILLARY     Status: Abnormal   Collection  Time    08/04/14  6:53 PM      Result Value Ref Range   Glucose-Capillary 106 (*) 70 - 99 mg/dL  GLUCOSE, CAPILLARY     Status: Abnormal   Collection Time    08/05/14 12:07 AM      Result Value Ref Range   Glucose-Capillary 100 (*) 70 - 99 mg/dL  GLUCOSE, CAPILLARY     Status: Abnormal   Collection Time    08/05/14  5:58 AM      Result Value Ref Range   Glucose-Capillary 129 (*) 70 - 99 mg/dL  GLUCOSE, CAPILLARY     Status: None   Collection Time    08/05/14  1:56 PM      Result Value Ref Range   Glucose-Capillary 83  70 - 99 mg/dL   Comment 1 Notify RN    GLUCOSE, CAPILLARY     Status: Abnormal   Collection Time    08/05/14  4:36 PM      Result Value Ref Range   Glucose-Capillary 137 (*) 70 - 99 mg/dL  GLUCOSE, CAPILLARY     Status: Abnormal   Collection Time    08/05/14  8:58 PM      Result Value Ref Range   Glucose-Capillary 143 (*) 70 - 99 mg/dL  GLUCOSE, CAPILLARY  Status: Abnormal   Collection Time    08/06/14  6:43 AM      Result Value Ref Range   Glucose-Capillary 142 (*) 70 - 99 mg/dL  GLUCOSE, CAPILLARY     Status: Abnormal   Collection Time    08/06/14  2:04 PM      Result Value Ref Range   Glucose-Capillary 125 (*) 70 - 99 mg/dL   Comment 1 Notify RN    GLUCOSE, CAPILLARY     Status: None   Collection Time    08/06/14  8:12 PM      Result Value Ref Range   Glucose-Capillary 90  70 - 99 mg/dL    Lab Results  Component Value Date   HGBA1C 6.8* 02/11/2014     Intake/Output Summary (Last 24 hours) at 08/07/14 0837 Last data filed at 08/07/14 7341  Gross per 24 hour  Intake   1950 ml  Output    301 ml  Net   1649 ml    Patient Vitals for the past 24 hrs:  BP Temp Temp src Pulse Resp SpO2  08/07/14 0819 - - - 95 18 95 %  08/07/14 0600 134/89 mmHg 98.7 F (37.1 C) Axillary 99 17 97 %  08/07/14 0308 - - - 95 18 96 %  08/06/14 2358 - - - 99 18 93 %  08/06/14 2211 - - - 98 18 95 %  08/06/14 2010 152/79 mmHg 98.3 F (36.8 C) Oral 102 18 -   08/06/14 1615 - - - 88 18 97 %  08/06/14 1349 119/76 mmHg 98.9 F (37.2 C) Oral 85 17 97 %  08/06/14 1227 - - - 89 18 94 %     HEENT: #4 shiley trach, site clean Cardio: RRR and no murmur  pulse 96 Resp: CTA B/L and unlabored, clear.  o2 sat 96 GI: BS positive and PEG site clean. Extremity:  No Edema Skin:   Intact other than above  SCDs in place  Neuro: generally more alert. Initiates more Abnormal Sensory reduced on left difficult to assess secondary to MS, Abnormal Motor 4- LUE, 3- RLE, 2- LLE, 5/5 RUE? Moves RLE to pain., Dysarthric and Aphasic. Makes eye contact. Nods y/n Musc/Skel:  Normal GEN NAD obese   Assessment/Plan: 1. Functional deficits secondary to left Thalamic ICH, Hydrocephalus with Right  shunt which require 3+ hours per day of interdisciplinary therapy in a comprehensive inpatient rehab setting. 2. DVT Prophylaxis/Anticoagulation: Pharmaceutical: Lovenox- 3. DM type 2: Will monitor BS with ac/hs checks. Continue Levemir bid and adjust as indicated.  -reasonable control still  4.  HTN/CAD: continue Norvasc daily and metoprolol bid.   LOS (Days) 8 A FACE TO FACE EVALUATION WAS PERFORMED  Tony Davis 08/07/2014, 8:35 AM

## 2014-08-07 NOTE — Progress Notes (Signed)
Occupational Therapy Session Note  Patient Details  Name: TREJON DUFORD MRN: [id number] Date of Birth: May 03, 1961  Today's Date: 08/07/2014 OT Individual Time: 6195-0932 OT Individual Time Calculation (min): 35 min    Short Term Goals: Week 1:  OT Short Term Goal 1 (Week 1): Pt will perform dynamic sitting balance seated on EOB with Max A  in order to increase balance for ADLs. OT Short Term Goal 2 (Week 1): Pt will perform grooming with Max A in order to increase ADL performance. OT Short Term Goal 3 (Week 1): Pt will perform UB dressing with Max A in order to increase ADL performance. OT Short Term Goal 4 (Week 1): Pt will  engage in 5 minutes of functional activity before taking a rest break to increase endurance.   Skilled Therapeutic Interventions/Progress Updates:    Pt began session by working on supine to sit with total assist +2 (pt 10%).   Pt only verbalized 2-3 word during session such as "huh" and "yea".  He would not respond verbally when therapist asked him what football team he liked.  But he did nod his head "yes" or "no" to questions therapist would ask.  He was able to nod his head correctly 50% of the time when asked if he was at [facility] and if we were in [place].  Pt needed total assist for static sitting EOB with frequent LOB posteriorly.  He was able to maintain balance for one interval of 1 min with min guard assist on one occasion.  Pt was able to take paper towel from therapist and wipe his mouth X2 with slight delay and instructional command.  Incorporated forward weightshift by having pt reach forward to place the paper towel in the trash can once he finished.  Performed sliding board transfer to the tilt in space wheelchair with total assist +2 (pt 20%) to conclude session.   Therapy Documentation Precautions:  Precautions Precautions: Fall;Other (comment) Precaution Comments: PEG/abdominal precautions, trach, Bilateral thigh high TED hose   Restrictions Weight Bearing Restrictions: No  Vital Signs: Therapy Vitals Temp: 99.1 F (37.3 C) Temp src: Oral Pulse Rate: 93 Resp: 19 BP: 125/85 mmHg Patient Position (if appropriate): Sitting Oxygen Therapy SpO2: 90 % O2 Device: Trach collar O2 Flow Rate (L/min): 5 L/min FiO2 (%): 28 % Pain: Pain Assessment Pain Assessment: No/denies pain Faces Pain Scale: No hurt ADL: See FIM for current functional status  Therapy/Group: Individual Therapy  Jandiel Magallanes OTR/L 08/07/2014, 3:33 PM

## 2014-08-07 NOTE — Progress Notes (Signed)
Speech Language Pathology Daily Session Note  Patient Details  Name: Tony Davis MRN: [id number] Date of Birth: 11/23/1961  Today's Date: 08/07/2014 SLP Individual Time: 1430-1500 SLP Individual Time Calculation (min): 30 min  Short Term Goals: Week 1: SLP Short Term Goal 1 (Week 1): Patient will initiate verbal response to yes/no and open-ended questions within 5-10 seconds with moderate verbal/tactile cues. SLP Short Term Goal 2 (Week 1): Patient will tolerate current diet with no overt s/s aspiration. SLP Short Term Goal 3 (Week 1): Patient will tolerate PMV placement for all waking hours with no significant changes in SpO2 saturation. SLP Short Term Goal 4 (Week 1): Patient will follow basic level, 1-step directions with 75% accuracy.  Skilled Therapeutic Interventions: Skilled treatment session focused on cognitive-linguistic goals. Upon entering the room, patient was awake while upright in tilt-in-space wheelchair with PMSV in place. Vitals were [facility] and remained stable throughout the session. SLP facilitated session by providing extra time and Max-Total A multimodal cues to initiate verbal responses in regards to basic yes/no questions and wants/needs. Pt verbally responded to ~20% of clinician questions throughout the session and initiated functional, self-care tasks with extra time and Max A multimodal cues with focused attention for ~10-30 seconds. Patient left in room with quick release belt in place and all needs within reach. Continue with current plan of care.   FIM:  Comprehension Comprehension Mode: Auditory Comprehension: 1-Understands basic less than 25% of the time/requires cueing 75% of the time Expression Expression Mode: Verbal Expression Assistive Devices: 6-Talk trach valve Expression: 1-Expresses basis less than 25% of the time/requires cueing greater than 75% of the time. Social Interaction Social Interaction: 1-Interacts appropriately less than 25% of the  time. May be withdrawn or combative. Problem Solving Problem Solving: 1-Solves basic less than 25% of the time - needs direction nearly all the time or does not effectively solve problems and may need a restraint for safety Memory Memory: 1-Recognizes or recalls less than 25% of the time/requires cueing greater than 75% of the time  Pain Pain Assessment Pain Assessment: No/denies pain  Therapy/Group: Individual Therapy  Tony Davis, Tony Davis 08/07/2014, 3:13 PM

## 2014-08-08 ENCOUNTER — Inpatient Hospital Stay (HOSPITAL_COMMUNITY): Payer: Medicare Other

## 2014-08-08 ENCOUNTER — Inpatient Hospital Stay (HOSPITAL_COMMUNITY): Payer: Medicare Other | Admitting: Occupational Therapy

## 2014-08-08 DIAGNOSIS — IMO0001 Reserved for inherently not codable concepts without codable children: Secondary | ICD-10-CM

## 2014-08-08 DIAGNOSIS — E1165 Type 2 diabetes mellitus with hyperglycemia: Secondary | ICD-10-CM

## 2014-08-08 LAB — GLUCOSE, CAPILLARY
GLUCOSE-CAPILLARY: 138 mg/dL — AB (ref 70–99)
Glucose-Capillary: 91 mg/dL (ref 70–99)
Glucose-Capillary: 83 mg/dL (ref 70–99)
Glucose-Capillary: 119 mg/dL — ABNORMAL HIGH (ref 70–99)
Glucose-Capillary: 95 mg/dL (ref 70–99)

## 2014-08-08 MED ORDER — INSULIN DETEMIR 100 UNIT/ML ~~LOC~~ SOLN
40.0000 [IU] | Freq: Once | SUBCUTANEOUS | Status: AC
Start: 2014-08-08 — End: 2014-08-08
  Administered 2014-08-08: 40 [IU] via SUBCUTANEOUS
  Filled 2014-08-08: qty 0.4

## 2014-08-08 NOTE — Progress Notes (Signed)
Patient ID: Tony Davis, male   DOB: 1961/02/04, 53 y.o.   MRN: [id number]  Patient ID: Tony Davis, male   DOB: 1961/11/01, 53 y.o.   MRN: [id number]  08/08/14.  Subjective/Complaints:  53 y/o admit with functional deficits secondary to left Thalamic ICH, Hydrocephalus with Right  Shunt  Past Medical History  Diagnosis Date  . GERD (gastroesophageal reflux disease)   . HLD (hyperlipidemia)   . HTN (hypertension)   . Hematuria 8/12  . PFO (patent foramen ovale)     echo 8/12: EF 55-65%, grade 2 diast dysfnx; +PFO on bubble study;  TEE 4/13: EF normal, atrial septum with suspicion for interatrial septum fenestrations without flow across and few large bubbles noted in LA.  This was not felt to require closure   . CAD (coronary artery disease)     a. s/p promus DES circumflex artery 10/13/10. b. Cath 04/2013: stable disease but possible small vessel disease -Imdur added.  Marland Kitchen DVT (deep venous thrombosis) 2013    "right"   . TIA (transient ischemic attack)     "1-2; both after the one in 02/2010" (09/25/2013)  . OSA on CPAP   . Type II diabetes mellitus     June 2014 AIC 6.2%/notes 09/24/2013; pt denies this hx on 09/25/2013  . Arthritis     "knees" (09/25/2013)  . Rheumatoid arthritis(714.0)   . CVA (cerebral vascular accident) 02/2010    "lost peripheral vision when I had the stroke; some memory issues since" (09/25/2013)      Uneventful evening. No pain. More alert at times. Review of Systems - unable to obtain secondary to mental status  Objective: Vital Signs: Blood pressure 122/97, pulse 93, temperature 99.7 F (37.6 C), temperature source Axillary, resp. rate 20, weight 112.2 kg (247 lb 5.7 oz), SpO2 94.00%. No results found. Results for orders placed during the hospital encounter of 07/30/14 (from the past 72 hour(s))  GLUCOSE, CAPILLARY     Status: None   Collection Time    08/05/14  1:56 PM      Result Value Ref Range   Glucose-Capillary 83  70 - 99 mg/dL   Comment 1 Notify RN    GLUCOSE, CAPILLARY     Status: Abnormal   Collection Time    08/05/14  4:36 PM      Result Value Ref Range   Glucose-Capillary 137 (*) 70 - 99 mg/dL  GLUCOSE, CAPILLARY     Status: Abnormal   Collection Time    08/05/14  8:58 PM      Result Value Ref Range   Glucose-Capillary 143 (*) 70 - 99 mg/dL  GLUCOSE, CAPILLARY     Status: Abnormal   Collection Time    08/06/14  6:43 AM      Result Value Ref Range   Glucose-Capillary 142 (*) 70 - 99 mg/dL  GLUCOSE, CAPILLARY     Status: Abnormal   Collection Time    08/06/14  2:04 PM      Result Value Ref Range   Glucose-Capillary 125 (*) 70 - 99 mg/dL   Comment 1 Notify RN    GLUCOSE, CAPILLARY     Status: None   Collection Time    08/06/14  8:12 PM      Result Value Ref Range   Glucose-Capillary 90  70 - 99 mg/dL  GLUCOSE, CAPILLARY     Status: None   Collection Time    08/07/14  9:14 AM      Result  Value Ref Range   Glucose-Capillary 90  70 - 99 mg/dL  GLUCOSE, CAPILLARY     Status: None   Collection Time    08/07/14  3:56 PM      Result Value Ref Range   Glucose-Capillary 90  70 - 99 mg/dL  GLUCOSE, CAPILLARY     Status: Abnormal   Collection Time    08/07/14  8:34 PM      Result Value Ref Range   Glucose-Capillary 138 (*) 70 - 99 mg/dL   Comment 1 Notify RN    GLUCOSE, CAPILLARY     Status: None   Collection Time    08/08/14  2:29 AM      Result Value Ref Range   Glucose-Capillary 91  70 - 99 mg/dL  GLUCOSE, CAPILLARY     Status: None   Collection Time    08/08/14  8:13 AM      Result Value Ref Range   Glucose-Capillary 83  70 - 99 mg/dL    Lab Results  Component Value Date   HGBA1C 6.8* 02/11/2014     Intake/Output Summary (Last 24 hours) at 08/08/14 0831 Last data filed at 08/08/14 0656  Gross per 24 hour  Intake   1500 ml  Output    450 ml  Net   1050 ml    Patient Vitals for the past 24 hrs:  BP Temp Temp src Pulse Resp SpO2  08/08/14 0822 - - - 93 20 94 %  08/08/14 0655 122/97  mmHg 99.7 F (37.6 C) Axillary 98 19 97 %  08/08/14 0418 - - - 89 18 95 %  08/08/14 0013 - - - 90 18 94 %  08/07/14 2032 - - - 95 18 98 %  08/07/14 2005 152/94 mmHg 98.5 F (36.9 C) Oral 99 20 97 %  08/07/14 1606 - - - - - 95 %  08/07/14 1531 - - - - - 90 %  08/07/14 1500 125/85 mmHg 99.1 F (37.3 C) Oral 93 19 95 %  08/07/14 1214 - - - 94 18 94 %     HEENT: #4 shiley trach, site clean Cardio: RRR and no murmur  pulse 90 Resp: CTA B/L and unlabored, clear.  o2 sat 97 GI: BS positive and PEG site clean. Extremity:  No Edema Skin:   Intact other than above  SCDs in place  Neuro: generally more alert. Initiates more Abnormal Sensory reduced on left difficult to assess secondary to MS, Abnormal Motor 4- LUE, 3- RLE, 2- LLE, 5/5 RUE? Moves RLE to pain., Dysarthric and Aphasic. Makes eye contact. Nods y/n Musc/Skel:  Normal GEN NAD obese   Assessment/Plan: 1. Functional deficits secondary to left Thalamic ICH, Hydrocephalus with Right  shunt  2. DVT Prophylaxis/Anticoagulation: Pharmaceutical: Lovenox- 3. DM type 2: Will monitor BS with ac/hs checks. Continue Levemir bid and adjust as indicated.  -reasonable control still  4.  HTN/CAD: continue Norvasc daily and metoprolol bid.   LOS (Days) 9 A FACE TO FACE EVALUATION WAS PERFORMED  [name] 08/08/2014, 8:31 AM

## 2014-08-08 NOTE — Progress Notes (Signed)
Physical Therapy Session Note  Patient Details  Name: Tony Davis MRN: [id number] Date of Birth: 07-20-1961  Today's Date: 08/08/2014 PT Individual Time: 1530-1630 PT Individual Time Calculation (min): 60 min   Short Term Goals: Week 2:  PT Short Term Goal 1 (Week 2): Pt will demonstrate focused attention to therapeutic task for 30 seconds w/ max multimodal cues PT Short Term Goal 2 (Week 2): Pt to perform bed mobility w/ max Ax1person PT Short Term Goal 3 (Week 2): Pt to transfer bed<>w/c with Ax2 persons without use of lift equipment PT Short Term Goal 4 (Week 2): Pt to demonstrate sitting balance with min guard A, 50% of time PT Short Term Goal 5 (Week 2): Pt will ambulate 100' w/ Ax2 persons  Skilled Therapeutic Interventions/Progress Updates:    Pt received supine in bed, easily aroused and agreeable to participate in therapy. Session focused on functional mobility, orientation, attention. Pt oriented to person, not place. When asked what city he was in, pt answered "[place], [place]" several times despite therapist correcting him to correct city. Pt identified wife's name correctly but identified her as his daughter. Therapy tech arrived to assist therapist with functional mobility. While preparing pt for transfer, he was found to have had a large, loose BM in brief. x2 Nurse Techs arrived to assist w/ cleanup. Pt rolled several times w/ +2 A for clean up, TotalA for repositioning in bed after cleanup. Noted pt indicating fatigue after clean up. Pt moved supine>sit w/ MaxA, able to sit EOB for ~10 minutes w/ assist ranging from close S to MaxA. Transferred pt bed>w/c w/ MaxiMove lift. Pt left tilted back in w/c w/ all needs within reach.  Therapy Documentation Precautions:  Precautions Precautions: Fall;Other (comment) Precaution Comments: PEG/abdominal precautions, trach, Bilateral thigh high TED hose  Restrictions Weight Bearing Restrictions: No General:   Vital  Signs: Therapy Vitals Pulse Rate: 97 Resp: 22 Patient Position (if appropriate): Lying Oxygen Therapy SpO2: 96 % O2 Device: Trach collar O2 Flow Rate (L/min): 5 L/min FiO2 (%): 28 % Pain: Pain Assessment Pain Assessment: Faces Faces Pain Scale: Hurts a little bit Pain Location: Generalized Mobility:   Locomotion :    Trunk/Postural Assessment :    Balance:   Exercises:   Other Treatments:    See FIM for current functional status  Therapy/Group: Individual Therapy  Rada Hay Rada Hay, PT, DPT 08/08/2014, 5:14 PM

## 2014-08-08 NOTE — Progress Notes (Signed)
Occupational Therapy Session Note  Patient Details  Name: Tony Davis MRN: [id number] Date of Birth: [birth year]  Today's Date: 08/08/2014 OT Individual Time: 1440-1524 OT Individual Time Calculation (min): 44 min     Skilled Therapeutic Interventions/Progress Updates:    Patient seen this pm for OT intervention to address alertness, attention, postural control, and functional mobility as related to familiar functional activities.  Patient initially sleepy, difficult to arouse.  Obtained second helper and assisted patient to edge of bed (total assist.)  Once at edge of bed, patient began to make slight postural adjustments to maintain sitting balance.  If assistance removed, patient automatically began to pull forward and sit without support.  Patient continued to gain arousal, and was able to attend to a bathing task for greater than 30 seconds.  Hand over hand guidance was needed to initiate the task, but patient could then take over.  Patient became even brighter when his wife was in his visual field, and his voice was loud and clear with PMV in place to tell us her name.  Patient able to stand from elevated bed surface for 15 seconds and mod assist initially.  Patient able to assist in scooting toward head of bed at end of session to return to supine position.  Patient reported mild dizziness when upright initially, although was able to remain upright for 20 minutes.  Occasionally patient closed left eye - but unable to further test vision.    Therapy Documentation Precautions:  Precautions Precautions: Fall;Other (comment) Precaution Comments: PEG/abdominal precautions, trach, Bilateral thigh high TED hose  Restrictions Weight Bearing Restrictions: No   Vital Signs: Therapy Vitals Pulse Rate: 91 Resp: 18 Patient Position (if appropriate): Lying Oxygen Therapy SpO2: 97 % O2 Device: Trach collar O2 Flow Rate (L/min): 5 L/min FiO2 (%): 28 % Pain:  No pain indicated  See FIM for  current functional status  Therapy/Group: Individual Therapy  Mariah Milling 08/08/2014, 3:24 PM

## 2014-08-08 NOTE — Progress Notes (Signed)
Dr. Inda Merlin notified of patient's  CBG 95,due Vital bolus now, only get three per day, next due at 0800, and due Levemir insulin 55 units now.  Orders received:  Just give Levemir insulin 40 units tonight.

## 2014-08-09 ENCOUNTER — Encounter (HOSPITAL_COMMUNITY): Payer: Medicare Other

## 2014-08-09 ENCOUNTER — Inpatient Hospital Stay (HOSPITAL_COMMUNITY): Payer: Medicare Other

## 2014-08-09 ENCOUNTER — Inpatient Hospital Stay (HOSPITAL_COMMUNITY): Payer: Medicare Other | Admitting: Speech Pathology

## 2014-08-09 DIAGNOSIS — M1710 Unilateral primary osteoarthritis, unspecified knee: Secondary | ICD-10-CM

## 2014-08-09 DIAGNOSIS — I619 Nontraumatic intracerebral hemorrhage, unspecified: Secondary | ICD-10-CM

## 2014-08-09 DIAGNOSIS — I10 Essential (primary) hypertension: Secondary | ICD-10-CM

## 2014-08-09 DIAGNOSIS — IMO0002 Reserved for concepts with insufficient information to code with codable children: Secondary | ICD-10-CM

## 2014-08-09 DIAGNOSIS — J9600 Acute respiratory failure, unspecified whether with hypoxia or hypercapnia: Secondary | ICD-10-CM

## 2014-08-09 DIAGNOSIS — Z5189 Encounter for other specified aftercare: Secondary | ICD-10-CM

## 2014-08-09 LAB — GLUCOSE, CAPILLARY
GLUCOSE-CAPILLARY: 104 mg/dL — AB (ref 70–99)
Glucose-Capillary: 141 mg/dL — ABNORMAL HIGH (ref 70–99)
Glucose-Capillary: 91 mg/dL (ref 70–99)
Glucose-Capillary: 83 mg/dL (ref 70–99)

## 2014-08-09 MED ORDER — INSULIN DETEMIR 100 UNIT/ML ~~LOC~~ SOLN
40.0000 [IU] | Freq: Two times a day (BID) | SUBCUTANEOUS | Status: DC
  Administered 2014-08-09 – 2014-08-18 (×20): 40 [IU] via SUBCUTANEOUS
  Filled 2014-08-09 (×24): qty 0.4

## 2014-08-09 NOTE — Progress Notes (Signed)
Pt in gym with PT, no distress noted, tolerating decannulation well.

## 2014-08-09 NOTE — Progress Notes (Signed)
Occupational Therapy Session Note  Patient Details  Name: Tony Davis MRN: [id number] Date of Birth: December 06, 1961  Today's Date: 08/09/2014 OT Individual Time: 0800-0900 OT Individual Time Calculation (min): 60 min    Short Term Goals: Week 2:  OT Short Term Goal 1 (Week 2): Pt will maintain sitting balance EOB for 5 mins with min A while performing functional tasks OT Short Term Goal 2 (Week 2): Pt will perform UB dressing with mod A while seated OT Short Term Goal 3 (Week 2): Pt will perform LB dressing with max A OT Short Term Goal 4 (Week 2): Pt will engage in 5 mins of functional activity before taking a rest break to increase endurance OT Short Term Goal 5 (Week 2): Pt will sustain attention for 60 seconds with min verbal cues during funcitonal tasks  Skilled Therapeutic Interventions/Progress Updates:    Pt engaged in dressing tasks seated EOB.  Pt required mod verbal cues to follow one step commands and extra time to respond to commands.  Pt initiated moving BLE to EOB in preparation for sitting EOB.  Pt required tot A + 2 (pt=50%) to complete tasks.  Pt required tot A to scoot to EOB to enable BLE to touch floor.  Once correctly positioned pt able to maintain static sitting balance with close supervision.  Pt donned shirt seated EOB with occasional min a when patient would lean posteriorly.  Pt pulled up pants past knees after BLE threaded.  Pt required tot A + 2 (pt=50%) to stand and assist with pulling up pants.  Pt performed squat/scoot transfer to w/c (tot A + 2, pt=50%).  Pt incontinent of bowel after transfer to w/c and performed squat pivot transfer back to bed with tot A + 2 (pt=50%).  Pt required tot A + 2 for hygiene and changing pants.  Focus on activity tolerance, bed mobility, sitting balance, transfers, and standing balance.  Therapy Documentation Precautions:  Precautions Precautions: Fall;Other (comment) Precaution Comments: PEG/abdominal precautions, trach,  Bilateral thigh high TED hose  Restrictions Weight Bearing Restrictions: No Pain: Pain Assessment Pain Assessment: Faces Faces Pain Scale: No hurt  See FIM for current functional status  Therapy/Group: Individual Therapy  Leroy Libman 08/09/2014, 9:00 AM

## 2014-08-09 NOTE — Progress Notes (Signed)
Trach removed per MD order. No distress noted, applied 2lpm Pratt sats 95% rr 16, hr 86. Replicare occlusive dressing applied to stoma, no redness noted.  Pt tolerated well. Pt has a strong cough and able to vocalize well.

## 2014-08-09 NOTE — Progress Notes (Signed)
Subjective/Complaints:   Sugars lower yesterday Review of Systems - unable to obtain secondary to mental status Objective: Vital Signs: Blood pressure 139/90, pulse 96, temperature 98.7 F (37.1 C), temperature source Axillary, resp. rate 19, weight 112.2 kg (247 lb 5.7 oz), SpO2 96.00%. No results found. Results for orders placed during the hospital encounter of 07/30/14 (from the past 72 hour(s))  GLUCOSE, CAPILLARY     Status: Abnormal   Collection Time    08/06/14  2:04 PM      Result Value Ref Range   Glucose-Capillary 125 (*) 70 - 99 mg/dL   Comment 1 Notify RN    GLUCOSE, CAPILLARY     Status: None   Collection Time    08/06/14  8:12 PM      Result Value Ref Range   Glucose-Capillary 90  70 - 99 mg/dL  GLUCOSE, CAPILLARY     Status: None   Collection Time    08/07/14  9:14 AM      Result Value Ref Range   Glucose-Capillary 90  70 - 99 mg/dL  GLUCOSE, CAPILLARY     Status: None   Collection Time    08/07/14  3:56 PM      Result Value Ref Range   Glucose-Capillary 90  70 - 99 mg/dL  GLUCOSE, CAPILLARY     Status: Abnormal   Collection Time    08/07/14  8:34 PM      Result Value Ref Range   Glucose-Capillary 138 (*) 70 - 99 mg/dL   Comment 1 Notify RN    GLUCOSE, CAPILLARY     Status: None   Collection Time    08/08/14  2:29 AM      Result Value Ref Range   Glucose-Capillary 91  70 - 99 mg/dL  GLUCOSE, CAPILLARY     Status: None   Collection Time    08/08/14  8:13 AM      Result Value Ref Range   Glucose-Capillary 83  70 - 99 mg/dL  GLUCOSE, CAPILLARY     Status: Abnormal   Collection Time    08/08/14 11:33 AM      Result Value Ref Range   Glucose-Capillary 138 (*) 70 - 99 mg/dL  GLUCOSE, CAPILLARY     Status: Abnormal   Collection Time    08/08/14  2:29 PM      Result Value Ref Range   Glucose-Capillary 119 (*) 70 - 99 mg/dL   Comment 1 Documented in Chart    GLUCOSE, CAPILLARY     Status: None   Collection Time    08/08/14  8:21 PM      Result  Value Ref Range   Glucose-Capillary 95  70 - 99 mg/dL   Comment 1 Notify RN    GLUCOSE, CAPILLARY     Status: Abnormal   Collection Time    08/09/14  1:49 AM      Result Value Ref Range   Glucose-Capillary 141 (*) 70 - 99 mg/dL   Comment 1 Notify RN       HEENT: #4 shiley trach, site clean Cardio: RRR and no murmur Resp: CTA B/L and unlabored, clear.  GI: BS positive and PEG site clean. Extremity:  No Edema Skin:   Intact other than above Neuro:   more alert. Initiates more. Inconsistent attention.  Abnormal Sensory reduced on left difficult to assess secondary to MS, Abnormal Motor 4- LUE, 3- RLE, 2- LLE, 5/5 RUE? Moves RLE to pain., Dysarthric and Aphasic.  Makes eye contact. Nods y/n Musc/Skel:  Normal GEN NAD   Assessment/Plan: 1. Functional deficits secondary to left Thalamic ICH, Hydrocephalus with Right  shunt which require 3+ hours per day of interdisciplinary therapy in a comprehensive inpatient rehab setting. Physiatrist is providing close team supervision and 24 hour management of active medical problems listed below. Physiatrist and rehab team continue to assess barriers to discharge/monitor patient progress toward functional and medical goals.  FIM: FIM - Bathing Bathing Steps Patient Completed: Abdomen Bathing: 1: Total-Patient completes 0-2 of 10 parts or less than 25%  FIM - Upper Body Dressing/Undressing Upper body dressing/undressing steps patient completed: Thread/unthread left sleeve of pullover shirt/dress;Thread/unthread right sleeve of pullover shirt/dresss Upper body dressing/undressing: 3: Mod-Patient completed 50-74% of tasks FIM - Lower Body Dressing/Undressing Lower body dressing/undressing: 1: Two helpers  FIM - Toileting Toileting: 0: No continent bowel/bladder events this shift  FIM - Air cabin crew Transfers: 0-Activity did not occur  FIM - Control and instrumentation engineer Devices: Bed rails;Arm rests Bed/Chair  Transfer: 1: Mechanical lift  FIM - Locomotion: Wheelchair Locomotion: Wheelchair: 0: Activity did not occur FIM - Locomotion: Ambulation Locomotion: Ambulation Assistive Devices: Sara Plus Ambulation/Gait Assistance: 1: +2 Total assist Locomotion: Ambulation: 0: Activity did not occur  Comprehension Comprehension Mode: Auditory Comprehension: 1-Understands basic less than 25% of the time/requires cueing 75% of the time  Expression Expression Mode: Verbal Expression Assistive Devices: 6-Talk trach valve Expression: 1-Expresses basis less than 25% of the time/requires cueing greater than 75% of the time.  Social Interaction Social Interaction Mode: Asleep Social Interaction: 1-Interacts appropriately less than 25% of the time. May be withdrawn or combative.  Problem Solving Problem Solving Mode: Asleep Problem Solving: 1-Solves basic less than 25% of the time - needs direction nearly all the time or does not effectively solve problems and may need a restraint for safety  Memory Memory Mode: Asleep Memory: 1-Recognizes or recalls less than 25% of the time/requires cueing greater than 75% of the time Medical Problem List and Plan:  1. Functional deficits secondary to Left thalamic bleed with hydrocephalus.  2. DVT Prophylaxis/Anticoagulation: Pharmaceutical: Lovenox- 3. Chronic B-knee/Pain Management: Prn oxycodone.   Voltaren gel for knees.  -poorly-compliant pain patient in my practice 4. Mood: Monitor as mentation improves.  5. Neuropsych: This patient is not capable of making decisions on his own behalf.   -continue low dose ritalin---really don't want to push higher given CV history 6. Skin/Wound Care:   Continue air mattress overlay.  .  7. DM type 2: Will monitor BS with ac/hs checks. Continue Levemir bid and adjust as indicated.  -reasonable control still  8. Enterobacter tracheobronchitis: Continue Levaquin through today  -decannullate  7. HTN/CAD: continue Norvasc  daily and metoprolol bid.  8. ABLA: on iron supplement.   9.  Low grade fever temp--likely atelectasis  -ua neg, culture only 15k staph  LOS (Days) 10 A FACE TO FACE EVALUATION WAS PERFORMED  SWARTZ,ZACHARY T 08/09/2014, 7:57 AM

## 2014-08-09 NOTE — Progress Notes (Signed)
Occupational Therapy Weekly Progress Note  Patient Details  Name: Tony Davis MRN: [id number] Date of Birth: May 19, 1961  Beginning of progress report period: July 31, 2014 End of progress report period: August 09, 2014  Patient has met 3 of 4 short term goals.  Pt has made steady progress since admission.  Pt requires mod/max verbal cues and extra time to initiate tasks but is following one step commands approx 75% of time.  Pt continues to require max verbal cues for verbalizing responses when asked questions. Pt is currently receiving night baths from nursing but is engaging in dressing tasks.  Pt requires tot A + 2 for bed mobility and transfers.  Pt is able to sustain attention for approx 15 seconds and requires max verbal cues to redirect to task.   Patient continues to demonstrate the following deficits: decreased attention, decreased cognitive processing, decreased awareness, decreased strength, and therefore will continue to benefit from skilled OT intervention to enhance overall performance with BADL.  Patient progressing toward long term goals..  Continue plan of care.  OT Short Term Goals Week 1:  OT Short Term Goal 1 (Week 1): Pt will perform dynamic sitting balance seated on EOB with Max A  in order to increase balance for ADLs. OT Short Term Goal 1 - Progress (Week 1): Met OT Short Term Goal 2 (Week 1): Pt will perform grooming with Max A in order to increase ADL performance. OT Short Term Goal 2 - Progress (Week 1): Met OT Short Term Goal 3 (Week 1): Pt will perform UB dressing with Max A in order to increase ADL performance. OT Short Term Goal 3 - Progress (Week 1): Met OT Short Term Goal 4 (Week 1): Pt will  engage in 5 minutes of functional activity before taking a rest break to increase endurance.  OT Short Term Goal 4 - Progress (Week 1): Progressing toward goal Week 2:  OT Short Term Goal 1 (Week 2): Pt will maintain sitting balance EOB for 5 mins with min A while  performing functional tasks OT Short Term Goal 2 (Week 2): Pt will perform UB dressing with mod A while seated OT Short Term Goal 3 (Week 2): Pt will perform LB dressing with max A OT Short Term Goal 4 (Week 2): Pt will engage in 5 mins of functional activity before taking a rest break to increase endurance OT Short Term Goal 5 (Week 2): Pt will sustain attention for 60 seconds with min verbal cues during funcitonal tasks   Therapy Documentation Precautions:  Precautions Precautions: Fall;Other (comment) Precaution Comments: PEG/abdominal precautions, trach, Bilateral thigh high TED hose  Restrictions Weight Bearing Restrictions: No  Leroy Libman 08/09/2014, 6:49 AM   Clyda Greener, OTR/L 08/10/2014

## 2014-08-09 NOTE — Progress Notes (Signed)
Speech Language Pathology Weekly Progress and Session Note  Patient Details  Name: Tony Davis MRN: [id number] Date of Birth: [birth year]  Beginning of progress report period: July 31, 2014 End of progress report period: August 09, 2014  Today's Date: 08/09/2014 SLP Individual Time: 1102-1202 SLP Individual Time Calculation (min): 60 min  Short Term Goals: Week 1: SLP Short Term Goal 1 (Week 1): Patient will initiate verbal response to yes/no and open-ended questions within 5-10 seconds with moderate verbal/tactile cues. SLP Short Term Goal 1 - Progress (Week 1): Progressing toward goal SLP Short Term Goal 2 (Week 1): Patient will tolerate current diet with no overt s/s aspiration. SLP Short Term Goal 2 - Progress (Week 1): Revised due to lack of progress SLP Short Term Goal 3 (Week 1): Patient will tolerate PMV placement for all waking hours with no significant changes in SpO2 saturation. SLP Short Term Goal 3 - Progress (Week 1): Met SLP Short Term Goal 4 (Week 1): Patient will follow basic level, 1-step directions with 75% accuracy. SLP Short Term Goal 4 - Progress (Week 1): Progressing toward goal    New Short Term Goals: Week 2: SLP Short Term Goal 1 (Week 2): Patient will initiate verbal response to yes/no questions with Max multimodal cues. SLP Short Term Goal 2 (Week 2): Patient will consume trials of thin liquids via cup with no overt s/s of aspiration.   SLP Short Term Goal 3 (Week 2): Patient will follow basic, 1-step directions during self-care tasks with Max multimodal cues. SLP Short Term Goal 4 (Week 2): Patient will demonstrate use of call bell with Max multimodal cues. SLP Short Term Goal 5 (Week 2): Patient will sustain attention to basic self-care task for ~81mnute with Max multimodal cues.   Weekly Progress Updates: Patient has made pre-functional gains and has met 1 of 4 short term goals this reporting period due to PMSV toleration and decannulation.  Patient  has also increased arousal and improved he non-verbal responses.  Currently, patient continues to require Total assist for initiation of expression of needs and wants as well as to complete any self-care task.  Patient and family education has not yet started due to family not being present.  Patient would benefit from continued skilled SLP intervention to maximize functional participation in order to maximize his functional independence prior to discharge with 24/7 assist.   Intensity: Minumum of 1-2 x/day, 30 to 90 minutes Frequency: 5 out of 7 days Duration/Length of Stay: 2-3 weeks Treatment/Interventions: Cognitive remediation/compensation;Speech/Language facilitation;Internal/external aids;Patient/family education;Dysphagia/aspiration precaution training;Functional tasks;Cueing hierarchy   Daily Session  Skilled Therapeutic Interventions: Skilled treatment session focused on addressing dysphagia and cognitive-linguistic goals. Upon arrival, patient lying in bed and required Total assist 2+ transfer assist with use of lift equipment.  SLP facilitated session with Max multimodal cues for initiation of 1-step directions during bed mobility.  SLP also facilitated session with Max multimodal cues to initiate verbal responses to basic yes/no questions pertaining to needs; with patient responding verbally x3 during session.  SLP facilitated oral care via suctioning with set-up assist and hand-over-hand assist to complete task; patient demonstrated no initiation with increased wait time and cues.  SLP then facilitated self-feeding with hand-over-hand assist increased to Total assist for ice chip trials; however, when trials were progressed to cup sips of thin liquid patient initiated getting cup and taking sip x4 without clinician cues.  Patient demonstrated no overt s/s of aspiration during trials. Continue with current plan of care.  FIM:  Comprehension Comprehension Mode: Auditory Comprehension:  1-Understands basic less than 25% of the time/requires cueing 75% of the time Expression Expression Mode: Verbal Expression: 1-Expresses basis less than 25% of the time/requires cueing greater than 75% of the time. Social Interaction Social Interaction: 1-Interacts appropriately less than 25% of the time. May be withdrawn or combative. Problem Solving Problem Solving: 1-Solves basic less than 25% of the time - needs direction nearly all the time or does not effectively solve problems and may need a restraint for safety Memory Memory: 1-Recognizes or recalls less than 25% of the time/requires cueing greater than 75% of the time FIM - Eating Eating Activity: 4: Help with picking up utensils;4: Help with managing cup/glass (with trials) General    Pain Pain Assessment Pain Assessment: No/denies pain Faces Pain Scale: No hurt  Therapy/Group: Individual Therapy  Carmelia Roller., [facility] [phone]  [place] 08/09/2014, 12:45 PM

## 2014-08-09 NOTE — Progress Notes (Addendum)
Physical Therapy Session Note  Patient Details  Name: Tony Davis MRN: [id number] Date of Birth: Jul 26, 1961  Today's Date: 08/09/2014 PT Individual Time: 1400-1500 PT Individual Time Calculation (min): 60 min   Short Term Goals: Week 1:  PT Short Term Goal 1 (Week 1): Pt will transfer out of bed to w/c using maxi sky lift PT Short Term Goal 1 - Progress (Week 1): Met PT Short Term Goal 2 (Week 1): Pt will tolerate sitting up in tilt in space w/c for 2 hours. PT Short Term Goal 2 - Progress (Week 1): Met PT Short Term Goal 3 (Week 1): Pt will roll in bed req 1 person assist PT Short Term Goal 3 - Progress (Week 1): Progressing toward goal PT Short Term Goal 4 (Week 1): Pt will tolerate supine/side lie to sit transfer req 2 person assist.  PT Short Term Goal 4 - Progress (Week 1): Met PT Short Term Goal 5 (Week 1): Pt will demonstrate sustained focus of an activity for 30 seconds.  PT Short Term Goal 5 - Progress (Week 1): Not met  Skilled Therapeutic Interventions/Progress Updates:  1:1. Pt received sitting in tilt-in-space w/c, ready for therapy. Focus this session on activity tolerance, focused attention, initiation, t/f sit<>stand, balance, following simple step commands and attempted ambulation. Pt req max multimodal cues throughout session for initiation and completion of all tasks. Pt req max Ax1 person to Ax2 persons for completion of t/f sit<>stand 2x. Attempted to progress to ambulation with therapists facilitating B weight shifts, but pt only able to demonstrate 2 shuffled steps forward. Noted grimacing w/ increased WB through L LE and L medial/lateral knee tender to touch, RN made aware. Pt demonstrated increased engagement in placing basketball in hoop w/ B UE at seated and standing level, encouraging t/f sit<>stand 2x during task w/ mod Ax1person to Ax2 persons for balance but decreased focused attention to task with increasing distractions in gym environment. Pt left sitting in  tilt-in-space w/c at end of session w/ all needs in reach and quick release belt in place. Therapist asking pt if he was ok and pt stated, "no I'm not ok" but unable to clarify. RN made aware.   SaO2 99% on 2L nasal canual throughout session.   Therapy Documentation Precautions:  Precautions Precautions: Fall;Other (comment) Precaution Comments: PEG/abdominal precautions, trach, Bilateral thigh high TED hose  Restrictions Weight Bearing Restrictions: No Pain: Pain Assessment Pain Assessment: Faces Faces Pain Scale: Hurts whole lot Pain Type: Acute pain Pain Location: Knee Pain Orientation: Left Pain Descriptors / Indicators: Discomfort;Aching;Tender Pain Intervention(s): RN made aware;Distraction  See FIM for current functional status  Therapy/Group: Individual Therapy  Gilmore Laroche 08/09/2014, 5:26 PM

## 2014-08-10 ENCOUNTER — Inpatient Hospital Stay (HOSPITAL_COMMUNITY): Payer: Medicare Other | Admitting: Speech Pathology

## 2014-08-10 ENCOUNTER — Encounter (HOSPITAL_COMMUNITY): Payer: Medicare Other

## 2014-08-10 ENCOUNTER — Inpatient Hospital Stay (HOSPITAL_COMMUNITY): Payer: Medicare Other | Admitting: Rehabilitation

## 2014-08-10 ENCOUNTER — Inpatient Hospital Stay (HOSPITAL_COMMUNITY): Payer: Medicare Other

## 2014-08-10 DIAGNOSIS — J9600 Acute respiratory failure, unspecified whether with hypoxia or hypercapnia: Secondary | ICD-10-CM

## 2014-08-10 DIAGNOSIS — I10 Essential (primary) hypertension: Secondary | ICD-10-CM

## 2014-08-10 DIAGNOSIS — M1710 Unilateral primary osteoarthritis, unspecified knee: Secondary | ICD-10-CM

## 2014-08-10 DIAGNOSIS — I619 Nontraumatic intracerebral hemorrhage, unspecified: Secondary | ICD-10-CM

## 2014-08-10 DIAGNOSIS — IMO0002 Reserved for concepts with insufficient information to code with codable children: Secondary | ICD-10-CM

## 2014-08-10 DIAGNOSIS — Z5189 Encounter for other specified aftercare: Secondary | ICD-10-CM

## 2014-08-10 LAB — GLUCOSE, CAPILLARY
GLUCOSE-CAPILLARY: 92 mg/dL (ref 70–99)
Glucose-Capillary: 92 mg/dL (ref 70–99)
Glucose-Capillary: 159 mg/dL — ABNORMAL HIGH (ref 70–99)
Glucose-Capillary: 90 mg/dL (ref 70–99)

## 2014-08-10 NOTE — Progress Notes (Signed)
Occupational Therapy Session Note  Patient Details  Name: Tony Davis MRN: [id number] Date of Birth: Oct 10, 1961  Today's Date: 08/10/2014 OT Individual Time: 0900-1000 OT Individual Time Calculation (min): 60 min    Short Term Goals: Week 2:  OT Short Term Goal 1 (Week 2): Pt will maintain sitting balance EOB for 5 mins with min A while performing functional tasks OT Short Term Goal 2 (Week 2): Pt will perform UB dressing with mod A while seated OT Short Term Goal 3 (Week 2): Pt will perform LB dressing with max A OT Short Term Goal 4 (Week 2): Pt will engage in 5 mins of functional activity before taking a rest break to increase endurance OT Short Term Goal 5 (Week 2): Pt will sustain attention for 60 seconds with min verbal cues during funcitonal tasks  Skilled Therapeutic Interventions/Progress Updates:    Pt resting in bed upon arrival.  Pt acknowledged therapist when spoken to but did not initiate any conversation throughout session.  Pt required tot verbal cues to respond to yes/no questions throhgout session.  Pt required max multimodal cues to initiate all functional and transitional movements during this session.  Pt required tot A + 2 for supine->sit EOB.  Pt required mod A for static sitting balance EOB.  Pt required increased assistance with UB dressing this morning. Pt required tot A + 2 (pt=15%) for squat pivot transfer to w/c.  Attempted to engage patient in grooming tasks while seated in w/c.  Pt attempted to wash face when wash cloth placed in hand but was only able to attend to task for approx 15 seconds and could not complete task.  Focus on activity tolerance, task initiation, following one step commands, attention to task, sitting balance, sit<>stand, and standing balance.  Therapy Documentation Precautions:  Precautions Precautions: Fall;Other (comment) Precaution Comments: PEG/abdominal precautions, trach, Bilateral thigh high TED hose  Restrictions Weight Bearing  Restrictions: No Pain: Pain Assessment Pain Assessment: Faces Faces Pain Scale: No hurt  See FIM for current functional status  Therapy/Group: Individual Therapy  Leroy Libman 08/10/2014, 10:01 AM

## 2014-08-10 NOTE — Progress Notes (Signed)
Subjective/Complaints:   Awake, no complaints. Happy tach is out. Review of Systems - very limited due to cognitive status Objective: Vital Signs: Blood pressure 148/82, pulse 100, temperature 98.5 F (36.9 C), temperature source Oral, resp. rate 18, weight 112.2 kg (247 lb 5.7 oz), SpO2 96.00%. No results found. Results for orders placed during the hospital encounter of 07/30/14 (from the past 72 hour(s))  GLUCOSE, CAPILLARY     Status: None   Collection Time    08/07/14  9:14 AM      Result Value Ref Range   Glucose-Capillary 90  70 - 99 mg/dL  GLUCOSE, CAPILLARY     Status: None   Collection Time    08/07/14  3:56 PM      Result Value Ref Range   Glucose-Capillary 90  70 - 99 mg/dL  GLUCOSE, CAPILLARY     Status: Abnormal   Collection Time    08/07/14  8:34 PM      Result Value Ref Range   Glucose-Capillary 138 (*) 70 - 99 mg/dL   Comment 1 Notify RN    GLUCOSE, CAPILLARY     Status: None   Collection Time    08/08/14  2:29 AM      Result Value Ref Range   Glucose-Capillary 91  70 - 99 mg/dL  GLUCOSE, CAPILLARY     Status: None   Collection Time    08/08/14  8:13 AM      Result Value Ref Range   Glucose-Capillary 83  70 - 99 mg/dL  GLUCOSE, CAPILLARY     Status: Abnormal   Collection Time    08/08/14 11:33 AM      Result Value Ref Range   Glucose-Capillary 138 (*) 70 - 99 mg/dL  GLUCOSE, CAPILLARY     Status: Abnormal   Collection Time    08/08/14  2:29 PM      Result Value Ref Range   Glucose-Capillary 119 (*) 70 - 99 mg/dL   Comment 1 Documented in Chart    GLUCOSE, CAPILLARY     Status: None   Collection Time    08/08/14  8:21 PM      Result Value Ref Range   Glucose-Capillary 95  70 - 99 mg/dL   Comment 1 Notify RN    GLUCOSE, CAPILLARY     Status: Abnormal   Collection Time    08/09/14  1:49 AM      Result Value Ref Range   Glucose-Capillary 141 (*) 70 - 99 mg/dL   Comment 1 Notify RN    GLUCOSE, CAPILLARY     Status: Abnormal   Collection Time    08/09/14  8:03 AM      Result Value Ref Range   Glucose-Capillary 104 (*) 70 - 99 mg/dL   Comment 1 Notify RN    GLUCOSE, CAPILLARY     Status: None   Collection Time    08/09/14  1:41 PM      Result Value Ref Range   Glucose-Capillary 91  70 - 99 mg/dL  GLUCOSE, CAPILLARY     Status: None   Collection Time    08/09/14  8:03 PM      Result Value Ref Range   Glucose-Capillary 83  70 - 99 mg/dL   Comment 1 Notify RN       HEENT: neck dressed/ occlusive dressing Cardio: RRR and no murmur Resp: CTA B/L and unlabored, clear.  GI: BS positive and PEG site clean. Extremity:  No Edema Skin:   Intact other than above Neuro:   more alert. Initiates more. Inconsistent attention and eye contact however.  Abnormal Sensory reduced on left difficult to assess secondary to MS, Abnormal Motor 4- LUE, 3- RLE, 2- LLE, 5/5 RUE grossly Moves RLE spontaneously., Dysarthric and Aphasic.   Musc/Skel:  Normal GEN NAD   Assessment/Plan: 1. Functional deficits secondary to left Thalamic ICH, Hydrocephalus with Right  shunt which require 3+ hours per day of interdisciplinary therapy in a comprehensive inpatient rehab setting. Physiatrist is providing close team supervision and 24 hour management of active medical problems listed below. Physiatrist and rehab team continue to assess barriers to discharge/monitor patient progress toward functional and medical goals.  FIM: FIM - Bathing Bathing Steps Patient Completed: Abdomen Bathing: 0: Activity did not occur  FIM - Upper Body Dressing/Undressing Upper body dressing/undressing steps patient completed: Thread/unthread right sleeve of pullover shirt/dresss;Thread/unthread left sleeve of pullover shirt/dress;Put head through opening of pull over shirt/dress Upper body dressing/undressing: 4: Min-Patient completed 75 plus % of tasks FIM - Lower Body Dressing/Undressing Lower body dressing/undressing: 1: Two helpers  FIM - Toileting Toileting: 0: No  continent bowel/bladder events this shift  FIM - Air cabin crew Transfers: 0-Activity did not occur  FIM - Control and instrumentation engineer Devices: Bed rails;Arm rests Bed/Chair Transfer: 0: Activity did not occur  FIM - Locomotion: Wheelchair Locomotion: Wheelchair: 1: Total Assistance/staff pushes wheelchair (Pt<25%) FIM - Locomotion: Ambulation Locomotion: Ambulation Assistive Devices: Other (comment) (B HHA) Ambulation/Gait Assistance: 1: +2 Total assist Locomotion: Ambulation: 1: Two helpers  Comprehension Comprehension Mode: Auditory Comprehension: 1-Understands basic less than 25% of the time/requires cueing 75% of the time  Expression Expression Mode: Verbal Expression Assistive Devices: 6-Talk trach valve Expression: 1-Expresses basis less than 25% of the time/requires cueing greater than 75% of the time.  Social Interaction Social Interaction Mode: Asleep Social Interaction: 1-Interacts appropriately less than 25% of the time. May be withdrawn or combative.  Problem Solving Problem Solving Mode: Asleep Problem Solving: 1-Solves basic less than 25% of the time - needs direction nearly all the time or does not effectively solve problems and may need a restraint for safety  Memory Memory Mode: Asleep Memory: 1-Recognizes or recalls less than 25% of the time/requires cueing greater than 75% of the time Medical Problem List and Plan:  1. Functional deficits secondary to Left thalamic bleed with hydrocephalus.  2. DVT Prophylaxis/Anticoagulation: Pharmaceutical: Lovenox- 3. Chronic B-knee/Pain Management: Prn oxycodone.   Voltaren gel for knees.  -poorly-compliant pain patient in my practice 4. Mood: Monitor as mentation improves.  5. Neuropsych: This patient is not capable of making decisions on his own behalf.   -continue low dose ritalin---will not push higher given CV history 6. Skin/Wound Care:   Continue air mattress overlay.  .  7. DM  type 2: Will monitor BS with ac/hs checks. Continue Levemir bid and adjust as indicated.  -reasonable control still  8. Enterobacter tracheobronchitis: Continue Levaquin through today  -decannulated without any problems  7. HTN/CAD: continue Norvasc daily and metoprolol bid.  8. ABLA: on iron supplement.   9.  Low grade fever temp--likely atelectasis  -afebrile currently  LOS (Days) 11 A FACE TO FACE EVALUATION WAS PERFORMED  Glenroy Crossen T 08/10/2014, 7:34 AM

## 2014-08-10 NOTE — Progress Notes (Signed)
Physical Therapy Session Note  Patient Details  Name: Tony Davis MRN: [id number] Date of Birth: 11-Sep-1961  Today's Date: 08/10/2014 PT Individual Time: 1600-1630 PT Individual Time Calculation (min): 30 min   Short Term Goals: Week 2:  PT Short Term Goal 1 (Week 2): Pt will demonstrate focused attention to therapeutic task for 30 seconds w/ max multimodal cues PT Short Term Goal 2 (Week 2): Pt to perform bed mobility w/ max Ax1person PT Short Term Goal 3 (Week 2): Pt to transfer bed<>w/c with Ax2 persons without use of lift equipment PT Short Term Goal 4 (Week 2): Pt to demonstrate sitting balance with min guard A, 50% of time PT Short Term Goal 5 (Week 2): Pt will ambulate 100' w/ Ax2 persons  Skilled Therapeutic Interventions/Progress Updates:  1:1. Pt received sitting in tilt-in-space, ready for therapy. Focus this session on activity tolerance, focused>sustained attention, initiation and gait training. Pt able to amb 10' w/ use of eva walker and Ax2 persons. Pt demonstrating overall flexed posture w/ narrow BOS, therapist facilitating widened BOS. Pt able to negotiate up 3, 3" steps and down 2, 6" steps w/ B rails and Ax2 persons. Pt req Ax3 persons to sit safely in chair following last step due to decreased attention and poor BOS. Overall, pt req max multimodal cues for focused attention to tasks in mildly busy therapy gym and initiation for all stepping. SaO2 99% on 2L O2 throughout session. Pt left sitting in tilt-in-space at end of session w/ all needs in reach and quick release belt in place.   Therapy Documentation Precautions:  Precautions Precautions: Fall;Other (comment) Precaution Comments: PEG/abdominal precautions, trach, Bilateral thigh high TED hose  Restrictions Weight Bearing Restrictions: No  See FIM for current functional status  Therapy/Group: Individual Therapy  Gilmore Laroche 08/10/2014, 5:16 PM

## 2014-08-10 NOTE — Progress Notes (Signed)
Speech Language Pathology Daily Session Note  Patient Details  Name: Tony Davis MRN: [id number] Date of Birth: 08-Dec-1961  Today's Date: 08/10/2014 SLP Individual Time: 1140-1210 SLP Individual Time Calculation (min): 30 min  Short Term Goals: Week 2: SLP Short Term Goal 1 (Week 2): Patient will initiate verbal response to yes/no questions with Max multimodal cues. SLP Short Term Goal 2 (Week 2): Patient will consume trials of thin liquids via cup with no overt s/s of aspiration.   SLP Short Term Goal 3 (Week 2): Patient will follow basic, 1-step directions during self-care tasks with Max multimodal cues. SLP Short Term Goal 4 (Week 2): Patient will demonstrate use of call bell with Max multimodal cues. SLP Short Term Goal 5 (Week 2): Patient will sustain attention to basic self-care task for ~40minute with Max multimodal cues.   Skilled Therapeutic Interventions: Skilled treatment session focused on addressing dysphagia goals. SLP facilitated session by providing set-up assist, extra time and Max-Total A multimodal cues to initiate oral care via suctioning.  Patient consumed trials of ice chips and thin liquids via cup with no overt s/s of aspiration.  Recommend to initiate Water Protocol.   FIM:  Comprehension Comprehension Mode: Auditory Comprehension: 1-Understands basic less than 25% of the time/requires cueing 75% of the time Expression Expression Mode: Verbal Expression: 1-Expresses basis less than 25% of the time/requires cueing greater than 75% of the time. Social Interaction Social Interaction: 1-Interacts appropriately less than 25% of the time. May be withdrawn or combative. Problem Solving Problem Solving: 1-Solves basic less than 25% of the time - needs direction nearly all the time or does not effectively solve problems and may need a restraint for safety Memory Memory: 1-Recognizes or recalls less than 25% of the time/requires cueing greater than 75% of the time FIM  - Eating Eating Activity: 4: Help with picking up utensils;4: Help with managing cup/glass (with trials)  Pain Pain Assessment Pain Assessment: No/denies pain  Therapy/Group: Individual Therapy  Carmelia Roller., [place] [phone]  [place] 08/10/2014, 12:59 PM

## 2014-08-10 NOTE — Progress Notes (Signed)
Physical Therapy Session Note  Patient Details  Name: Tony Davis MRN: [id number] Date of Birth: 06-27-1961  Today's Date: 08/10/2014 PT Individual Time: 4098-1191 PT Individual Time Calculation (min): 62 min   Short Term Goals: Week 2:  PT Short Term Goal 1 (Week 2): Pt will demonstrate focused attention to therapeutic task for 30 seconds w/ max multimodal cues PT Short Term Goal 2 (Week 2): Pt to perform bed mobility w/ max Ax1person PT Short Term Goal 3 (Week 2): Pt to transfer bed<>w/c with Ax2 persons without use of lift equipment PT Short Term Goal 4 (Week 2): Pt to demonstrate sitting balance with min guard A, 50% of time PT Short Term Goal 5 (Week 2): Pt will ambulate 100' w/ Ax2 persons  Skilled Therapeutic Interventions/Progress Updates:   Pt received sitting in w/c in room, wife present, but left before session started.  Assisted pt to/from therapy gym at total A level.  Skilled session focused on focused/sustained attention, initiation, sit<>stand transfers, standing tolerance, weight shifts, gait, and stair negotiation to increase initiation with more automatic tasks.  Performed 3-4 reps of standing for up to 3 mins each with +2 assist.  Pt requires two assist to get into standing, however once in standing requires only mod A to maintain balance.  Attempted gait with pt able to advance LEs for two steps, then requires max A to advance each LE due to decreased initiation.  Transitioned to reaching task for initiation, grasping for horse shoes from rehab tech.  Pt voiced "I don't want to grab horse shoes."  When asked what he did want to do, he states "I want to play basketball."  Therefore switched to basketball task with pt reaching across body to place ball in hoop.  Allowed rest break and ended session with stair negotiation.  Pt able to ascend/descend 3, 4" steps with use of B handrails with +2 assist (pt assist 50%).  Did much better with initiation with auditory and visual cues  to step up.  He was also able to turn at top of stairs and then turn to sit back into chair.  Performed half of session on 2 L O2 and pt able to maintain 100%, therefore decreased to 1L for remainder of session, and remained sats at 96%.  Pt assisted back to room, left tilted and QRB donned with all needs in reach.    Therapy Documentation Precautions:  Precautions Precautions: Fall;Other (comment) Precaution Comments: PEG/abdominal precautions, trach, Bilateral thigh high TED hose  Restrictions Weight Bearing Restrictions: No   Vital Signs: Therapy Vitals Temp: 98.5 F (36.9 C) Temp src: Oral Pulse Rate: 88 Resp: 17 BP: 134/83 mmHg Patient Position (if appropriate): Sitting Oxygen Therapy SpO2: 100 % O2 Device: Nasal cannula Pain: Pain Assessment Pain Assessment: No/denies pain   Locomotion : Ambulation Ambulation/Gait Assistance: 1: +2 Total assist   See FIM for current functional status  Therapy/Group: Individual Therapy  [name] 08/10/2014, 3:35 PM

## 2014-08-11 ENCOUNTER — Inpatient Hospital Stay (HOSPITAL_COMMUNITY): Payer: Medicare Other

## 2014-08-11 ENCOUNTER — Inpatient Hospital Stay (HOSPITAL_COMMUNITY): Payer: Medicare Other | Admitting: Speech Pathology

## 2014-08-11 ENCOUNTER — Encounter (HOSPITAL_COMMUNITY): Payer: Medicare Other | Admitting: Occupational Therapy

## 2014-08-11 DIAGNOSIS — I619 Nontraumatic intracerebral hemorrhage, unspecified: Secondary | ICD-10-CM

## 2014-08-11 DIAGNOSIS — I10 Essential (primary) hypertension: Secondary | ICD-10-CM

## 2014-08-11 DIAGNOSIS — IMO0002 Reserved for concepts with insufficient information to code with codable children: Secondary | ICD-10-CM

## 2014-08-11 DIAGNOSIS — Z5189 Encounter for other specified aftercare: Secondary | ICD-10-CM

## 2014-08-11 DIAGNOSIS — M1710 Unilateral primary osteoarthritis, unspecified knee: Secondary | ICD-10-CM

## 2014-08-11 DIAGNOSIS — J9600 Acute respiratory failure, unspecified whether with hypoxia or hypercapnia: Secondary | ICD-10-CM

## 2014-08-11 LAB — GLUCOSE, CAPILLARY
GLUCOSE-CAPILLARY: 87 mg/dL (ref 70–99)
Glucose-Capillary: 95 mg/dL (ref 70–99)
Glucose-Capillary: 80 mg/dL (ref 70–99)

## 2014-08-11 MED ORDER — BACITRACIN ZINC 500 UNIT/GM EX OINT
TOPICAL_OINTMENT | CUTANEOUS | Status: DC | PRN
  Filled 2014-08-11: qty 28.35

## 2014-08-11 NOTE — Progress Notes (Signed)
Physical Therapy Session Note  Patient Details  Name: Tony Davis MRN: [id number] Date of Birth: [birth year]  Today's Date: 08/11/2014 PT Individual Time: 1630-1700 PT Individual Time Calculation (min): 30 min   Short Term Goals: Week 2:  PT Short Term Goal 1 (Week 2): Pt will demonstrate focused attention to therapeutic task for 30 seconds w/ max multimodal cues PT Short Term Goal 2 (Week 2): Pt to perform bed mobility w/ max Ax1person PT Short Term Goal 3 (Week 2): Pt to transfer bed<>w/c with Ax2 persons without use of lift equipment PT Short Term Goal 4 (Week 2): Pt to demonstrate sitting balance with min guard A, 50% of time PT Short Term Goal 5 (Week 2): Pt will ambulate 100' w/ Ax2 persons  Skilled Therapeutic Interventions/Progress Updates:  1:1. Pt received semi-reclined in bed, awake and ready for therapy. Focus this session on alertness/arousal, activity tolerance, focused>susatined attention, initiation, following simple commands and functional transfers/mobility. With mod-occasional max multimodal cueing for initiation, pt t/f sup>sit EOB w/ mod A for completion, t/f sit<>stand from elevated bed w/ mod A and B UE support on handles of w/c, donning shorts w/ max A for completion at combination of seated and standing level and max A for squat pivot t/f bed>w/c. Pt req total A for w/c propulsion x100', but reciprocally moving B LE along. When given two options at end of session, pt verbalized seating preference including to be left upright in w/c (not tilted) and for leg rests to be off. Pt left w/ all needs in reach, quick release belt in place and wife in room at end of session, RN aware. Pt able to maintain SaO2 >90% throughout session on RA.   Therapy Documentation Precautions:  Precautions Precautions: Fall;Other (comment) Precaution Comments: PEG/abdominal precautions, trach, Bilateral thigh high TED hose  Restrictions Weight Bearing Restrictions: No Pain: Pain  Assessment Pain Assessment: No/denies pain  See FIM for current functional status  Therapy/Group: Individual Therapy  Gilmore Laroche 08/11/2014, 5:19 PM

## 2014-08-11 NOTE — Progress Notes (Signed)
Physical therapist made me aware of patients right lower extremity being significantly swollen, especially when compared to left lower extremity, which seems to be worsening.  Patient had only been out of bed for about 1 hour. PT also says patient is hesitant to stand or put weight on that right foot.  Notified Pam Love, PA of findings, ordered to elevate lower extremities when in chair.  Will continue to monitor patient closely.  Brita Romp, RN

## 2014-08-11 NOTE — Progress Notes (Signed)
Physical Therapy Session Note  Patient Details  Name: Tony Davis MRN: [id number] Date of Birth: January 19, 1961  Today's Date: 08/11/2014 PT Co-Treatment Time: 0900-0930 charged by Otis Brace, PT, DPT; [phone] charged by Carney Living, PT, DPT (Total Co-tx time 0900-1000) PT Co-Treatment Time Calculation (min): 30 min each (32min total Co-tx time)  Short Term Goals: Week 2:  PT Short Term Goal 1 (Week 2): Pt will demonstrate focused attention to therapeutic task for 30 seconds w/ max multimodal cues PT Short Term Goal 2 (Week 2): Pt to perform bed mobility w/ max Ax1person PT Short Term Goal 3 (Week 2): Pt to transfer bed<>w/c with Ax2 persons without use of lift equipment PT Short Term Goal 4 (Week 2): Pt to demonstrate sitting balance with min guard A, 50% of time PT Short Term Goal 5 (Week 2): Pt will ambulate 100' w/ Ax2 persons  Skilled Therapeutic Interventions/Progress Updates:  Pt received sitting in w/c, ready for therapy. Physical therapy co-tx this session for focus on alertness/arousal activity tolerance, focused>sustained attention, initiation, following simple step commands and ambulation. Pt req mod-max cues for initiating multiple t/f sit<>stand from w/c level during first half of tx session with Ax2 persons. Pt amb 25' w/ eva walker and Ax2 persons, demonstrating non-fluid pace and overall flexed trunk posture. Pt with increased orientation to place this session w/ mod cueing. Pt attending to seated ball toss activity counting up to 13 reps and placing basketball in hoop up to 30seconds from standing level w/ Ax2 persons and max multimodal cues. Pt demonstrating significantly decreased focused attention and initiation second half of session, req total A and initiation for engagement in limited conversation or functional transfers. Overall, pt demonstrating increased alertness and engagement in tasks at standing level vs. Seated. Pt able to maintain SaO2 >90% on RA throughout  session. Pt left sitting in w/c at end of session w/ all needs in reach, quick release belt in place.   -increased edema R LE, RN aware Therapy Documentation Precautions:  Precautions Precautions: Fall;Other (comment) Precaution Comments: PEG/abdominal precautions, trach, Bilateral thigh high TED hose  Restrictions Weight Bearing Restrictions: No Pain: Pain Assessment Pain Assessment: Faces Faces Pain Scale: No hurt  See FIM for current functional status  Therapy/Group: Co-Treatment  Gilmore Laroche, PT, DPT Joyce Copa, PT , DPT 08/11/2014, 12:22 PM

## 2014-08-11 NOTE — Patient Care Conference (Signed)
Inpatient RehabilitationTeam Conference and Plan of Care Update Date: 08/10/2014   Time: 2:50 PM    Patient Name: Tony Davis      Medical Record Number: [id number]  Date of Birth: September 07, 1961 Sex: Male         Room/Bed: 4W05C/4W05C-01 Payor Info: Payor: MEDICARE / Plan: MEDICARE PART A AND B / Product Type: *No Product type* /    Admitting Diagnosis: DECOND  S P L BG   Admit Date/Time:  07/30/2014  8:40 PM Admission Comments: No comment available   Primary Diagnosis:  <principal problem not specified> Principal Problem: <principal problem not specified>  Patient Active Problem List   Diagnosis Date Noted  . Basal ganglia hemorrhage 07/30/2014  . Syncope due to orthostatic hypotension 04/15/2014  . Medication adverse effect 04/15/2014  . Syncope 04/15/2014  . Routine general medical examination at a health care facility 02/11/2014  . BPH associated with nocturia 02/11/2014  . Gynecomastia 02/11/2014  . Osteoarthritis of left knee 08/21/2013  . Osteoarthritis of right knee 08/21/2013  . Insomnia, persistent 03/25/2013  . History of tobacco use 03/25/2013  . Chronic rhinitis 03/25/2013  . Hemiparesis due to old cerebral infarction 03/20/2013  . DJD (degenerative joint disease) of knee 10/13/2012  . Folate deficiency anemia 10/13/2012  . Long term (current) use of anticoagulants 03/28/2012  . DVT (deep venous thrombosis) 03/19/2012  . Obstructive sleep apnea 08/16/2011  . Type II or unspecified type diabetes mellitus without mention of complication, uncontrolled 08/06/2011  . PATENT FORAMEN OVALE 03/13/2010  . CVA 02/08/2010  . CAD, NATIVE VESSEL 01/26/2010  . Hyperlipidemia LDL goal < 100 01/23/2010  . Essential hypertension, benign 01/23/2010  . GERD 01/23/2010    Expected Discharge Date: Expected Discharge Date: 09/02/14  Team Members Present: Physician leading conference: Dr. Alger Simons Social Worker Present: Lennart Pall, LCSW Nurse Present: Elliot Cousin,  RN PT Present: Otis Brace, PT;Bridgett Ripa, PT OT Present: Roanna Epley, [place], OT SLP Present: Gunnar Fusi, SLP PPS Coordinator present : Daiva Nakayama, RN, CRRN     Current Status/Progress Goal Weekly Team Focus  Medical   some improvement in arousal, attention. trach out  improve attention  improve activation, attention   Bowel/Bladder   Incontinent of bowel and bladder; LBM 8/31- liquid BMs  Manage bowel and bladder with mod assist.  offer toileting q2h and prn   Swallow/Nutrition/ Hydration   NPO with peg  Max Assist  Initiated water protocol   ADL's   UB dressing-mod A; bathing and LB dressing-tot A; transfers-tot A + 2; max A for task initiation; decreased activity tolerance; decreased attention  mod A overall, min A UB dressing  activity tolerance, bed mobility, attention, transfers, sitting balance   Mobility   ModAx1person to Ax2oersons overall for bed mobility, squat pivot transfers, t/f sit<>stand, amb w/ use of Sara +; Mod-max cues for focused attention, initiation, following simple step commands  Supervision for sitting balance; Min A for cognition and w/c propulsion; Mod A for bed mobility, transfers; Max A for standing balance and ambulation  activity tolerance, bd mobility, transfers, balance, standing balance, attention, initiation, orientation   Communication   Max-Total A  Mod A  Initiation of expression of wants/needs, following 1 step commands   Safety/Cognition/ Behavioral Observations  Total A  mod assist  Sustained attention, initiation, basic problem solving   Pain   Denies pain; no evidence of pain  < 3  Assess and treat for pain q shift and prn   Skin  air overlay mattress; excoriation to scrotum with few open areas- applying barrier cream prn  no new skin breakdown or infection during rehab stay with mod assist.  Turn q2h; utilize air overlay mattress; assess skin shift and prn    Rehab Goals Patient on target to meet rehab goals:  Yes *See Care Plan and progress notes for long and short-term goals.  Barriers to Discharge: see prior, chronicity of injury    Possible Resolutions to Barriers:  NMR, cognitive perceptual rx    Discharge Planning/Teaching Needs:  wife still plans to have pt d/c home with her and other family members providing 24/7 assistance  ongoing   Team Discussion:  Lurline Idol out.  Some improved initiation but not consistent.  Knee pain significantly inhibiting standing.  MD feels may need to limit amount of time up due to severe OA of both knees.  Overall goals of mod assist at w/c level. H2O protocol underway.    Revisions to Treatment Plan:  May need to revise standing/ ambulation goals   Continued Need for Acute Rehabilitation Level of Care: The patient requires daily medical management by a physician with specialized training in physical medicine and rehabilitation for the following conditions: Daily direction of a multidisciplinary physical rehabilitation program to ensure safe treatment while eliciting the highest outcome that is of practical value to the patient.: Yes Daily medical management of patient stability for increased activity during participation in an intensive rehabilitation regime.: Yes Daily analysis of laboratory values and/or radiology reports with any subsequent need for medication adjustment of medical intervention for : Neurological problems;Post surgical problems  Deberah Adolf 08/11/2014, 6:43 AM

## 2014-08-11 NOTE — Progress Notes (Signed)
Speech Language Pathology Daily Session Note  Patient Details  Name: Tony Davis MRN: [id number] Date of Birth: 1961/09/20  Today's Date: 08/11/2014 SLP Individual Time: 1420-1450 SLP Individual Time Calculation (min): 30 min  Short Term Goals: Week 2: SLP Short Term Goal 1 (Week 2): Patient will initiate verbal response to yes/no questions with Max multimodal cues. SLP Short Term Goal 2 (Week 2): Patient will consume trials of thin liquids via cup with no overt s/s of aspiration.   SLP Short Term Goal 3 (Week 2): Patient will follow basic, 1-step directions during self-care tasks with Max multimodal cues. SLP Short Term Goal 4 (Week 2): Patient will demonstrate use of call bell with Max multimodal cues. SLP Short Term Goal 5 (Week 2): Patient will sustain attention to basic self-care task for ~63minute with Max multimodal cues.   Skilled Therapeutic Interventions: Skilled treatment session focused on addressing dysphagia goals. SLP facilitated session by providing set-up assist, extra time and Max A multimodal cues to initiate self-feeding of 4 oz of puree and 4oz of water.  Patient exhibited no overt s/s of aspiration.  Recommend to continue with current plan of care.     FIM:  Comprehension Comprehension Mode: Auditory Comprehension: 2-Understands basic 25 - 49% of the time/requires cueing 51 - 75% of the time Expression Expression Mode: Verbal Expression: 2-Expresses basic 25 - 49% of the time/requires cueing 50 - 75% of the time. Uses single words/gestures. Social Interaction Social Interaction: 2-Interacts appropriately 25 - 49% of time - Needs frequent redirection. Problem Solving Problem Solving: 1-Solves basic less than 25% of the time - needs direction nearly all the time or does not effectively solve problems and may need a restraint for safety Memory Memory: 1-Recognizes or recalls less than 25% of the time/requires cueing greater than 75% of the time FIM -  Eating Eating Activity: 4: Help with picking up utensils;4: Help with managing cup/glass (with trials )  Pain Pain Assessment Pain Assessment: No/denies pain  Therapy/Group: Individual Therapy  Carmelia Roller., [place] [phone]  [place] 08/11/2014, 4:25 PM

## 2014-08-11 NOTE — Progress Notes (Signed)
Occupational Therapy Session Note  Patient Details  Name: Tony Davis MRN: [id number] Date of Birth: [birth year]  Today's Date: 08/11/2014 OT Individual Time: 0800-0900   Short Term Goals: Week 2:  OT Short Term Goal 1 (Week 2): Pt will maintain sitting balance EOB for 5 mins with min A while performing functional tasks OT Short Term Goal 2 (Week 2): Pt will perform UB dressing with mod A while seated OT Short Term Goal 3 (Week 2): Pt will perform LB dressing with max A OT Short Term Goal 4 (Week 2): Pt will engage in 5 mins of functional activity before taking a rest break to increase endurance OT Short Term Goal 5 (Week 2): Pt will sustain attention for 60 seconds with min verbal cues during funcitonal tasks  Skilled Therapeutic Interventions/Progress Updates:    Pt awake and alert upon arrival.  Pt greeted therapist appropriately and responded to questions appropriately although he required extra time to respond.  Pt followed one step commands with extra time throughout session.  Pt required max A for supine->sit EOB for bathing and dressing activities.  Pt required mod A and max verbal cues for sitting balance EOB.  Pt required tot A + 2 (pt=50%) for sit<>stand from EOB to pull up pants.  Pt required tot A + 2 for squat pivot transfer to w/c. Pt engaged in grooming tasks while seated in w/c at sink.  Pt initiated brushing hair but required [facility] assist to complete task.  Focus on activity tolerance, following one step commands, task initiation, attention to task, bed mobility, sit<>stand, transfers, and active participation.   Therapy Documentation Precautions:  Precautions Precautions: Fall;Other (comment) Precaution Comments: PEG/abdominal precautions, trach, Bilateral thigh high TED hose  Restrictions Weight Bearing Restrictions: No Pain:  Pt denies pain  See FIM for current functional status  Therapy/Group: Individual Therapy  Leroy Libman 08/11/2014, 2:38 PM

## 2014-08-11 NOTE — Progress Notes (Signed)
Social Work Patient ID: Tony Davis, male   DOB: 1960-12-29, 53 y.o.   MRN: [id number]  Lennart Pall, LCSW Social Worker Signed  Patient Care Conference Service date: 08/11/2014 6:43 AM  Inpatient RehabilitationTeam Conference and Plan of Care Update Date: 08/10/2014   Time: 2:50 PM     Patient Name: Tony Davis       Medical Record Number: [id number]   Date of Birth: May 19, 1961 Sex: Male         Room/Bed: 4W05C/4W05C-01 Payor Info: Payor: MEDICARE / Plan: MEDICARE PART A AND B / Product Type: *No Product type* /   Admitting Diagnosis: DECOND  S P L BG   Admit Date/Time:  07/30/2014  8:40 PM Admission Comments: No comment available   Primary Diagnosis:  <principal problem not specified> Principal Problem: <principal problem not specified>    Patient Active Problem List     Diagnosis  Date Noted   .  Basal ganglia hemorrhage  07/30/2014   .  Syncope due to orthostatic hypotension  04/15/2014   .  Medication adverse effect  04/15/2014   .  Syncope  04/15/2014   .  Routine general medical examination at a health care facility  02/11/2014   .  BPH associated with nocturia  02/11/2014   .  Gynecomastia  02/11/2014   .  Osteoarthritis of left knee  08/21/2013   .  Osteoarthritis of right knee  08/21/2013   .  Insomnia, persistent  03/25/2013   .  History of tobacco use  03/25/2013   .  Chronic rhinitis  03/25/2013   .  Hemiparesis due to old cerebral infarction  03/20/2013   .  DJD (degenerative joint disease) of knee  10/13/2012   .  Folate deficiency anemia  10/13/2012   .  Long term (current) use of anticoagulants  03/28/2012   .  DVT (deep venous thrombosis)  03/19/2012   .  Obstructive sleep apnea  08/16/2011   .  Type II or unspecified type diabetes mellitus without mention of complication, uncontrolled  08/06/2011   .  PATENT FORAMEN OVALE  03/13/2010   .  CVA  02/08/2010   .  CAD, NATIVE VESSEL  01/26/2010   .  Hyperlipidemia LDL goal < 100  01/23/2010   .  Essential  hypertension, benign  01/23/2010   .  GERD  01/23/2010     Expected Discharge Date: Expected Discharge Date: 09/02/14  Team Members Present: Physician leading conference: Dr. Alger Simons Social Worker Present: Lennart Pall, LCSW Nurse Present: Elliot Cousin, RN PT Present: Otis Brace, PT;Bridgett Ripa, PT OT Present: Roanna Epley, [place], OT SLP Present: Gunnar Fusi, SLP PPS Coordinator present : Daiva Nakayama, RN, CRRN        Current Status/Progress  Goal  Weekly Team Focus   Medical     some improvement in arousal, attention. trach out  improve attention  improve activation, attention   Bowel/Bladder     Incontinent of bowel and bladder; LBM 8/31- liquid BMs  Manage bowel and bladder with mod assist.  offer toileting q2h and prn   Swallow/Nutrition/ Hydration     NPO with peg  Max Assist  Initiated water protocol   ADL's     UB dressing-mod A; bathing and LB dressing-tot A; transfers-tot A + 2; max A for task initiation; decreased activity tolerance; decreased attention  mod A overall, min A UB dressing  activity tolerance, bed mobility, attention, transfers, sitting balance   Mobility  ModAx1person to Ax2oersons overall for bed mobility, squat pivot transfers, t/f sit<>stand, amb w/ use of Sara +; Mod-max cues for focused attention, initiation, following simple step commands  Supervision for sitting balance; Min A for cognition and w/c propulsion; Mod A for bed mobility, transfers; Max A for standing balance and ambulation  activity tolerance, bd mobility, transfers, balance, standing balance, attention, initiation, orientation   Communication     Max-Total A  Mod A  Initiation of expression of wants/needs, following 1 step commands   Safety/Cognition/ Behavioral Observations    Total A  mod assist  Sustained attention, initiation, basic problem solving   Pain     Denies pain; no evidence of pain  < 3  Assess and treat for pain q shift and prn   Skin      air overlay mattress; excoriation to scrotum with few open areas- applying barrier cream prn  no new skin breakdown or infection during rehab stay with mod assist.  Turn q2h; utilize air overlay mattress; assess skin shift and prn    Rehab Goals Patient on target to meet rehab goals: Yes *See Care Plan and progress notes for long and short-term goals.    Barriers to Discharge:  see prior, chronicity of injury      Possible Resolutions to Barriers:    NMR, cognitive perceptual rx      Discharge Planning/Teaching Needs:    wife still plans to have pt d/c home with her and other family members providing 24/7 assistance   ongoing    Team Discussion:    Lurline Idol out.  Some improved initiation but not consistent.  Knee pain significantly inhibiting standing.  MD feels may need to limit amount of time up due to severe OA of both knees.  Overall goals of mod assist at w/c level. H2O protocol underway.     Revisions to Treatment Plan:    May need to revise standing/ ambulation goals    Continued Need for Acute Rehabilitation Level of Care: The patient requires daily medical management by a physician with specialized training in physical medicine and rehabilitation for the following conditions: Daily direction of a multidisciplinary physical rehabilitation program to ensure safe treatment while eliciting the highest outcome that is of practical value to the patient.: Yes Daily medical management of patient stability for increased activity during participation in an intensive rehabilitation regime.: Yes Daily analysis of laboratory values and/or radiology reports with any subsequent need for medication adjustment of medical intervention for : Neurological problems;Post surgical problems  Umberto Pavek 08/11/2014, 6:43 AM

## 2014-08-11 NOTE — Progress Notes (Signed)
Subjective/Complaints:   No problems overnight. More alert. Still slow to initiate but can at times Review of Systems - very limited due to cognitive status Objective: Vital Signs: Blood pressure 145/86, pulse 87, temperature 98.6 F (37 C), temperature source Oral, resp. rate 20, weight 112.2 kg (247 lb 5.7 oz), SpO2 100.00%. No results found. Results for orders placed during the hospital encounter of 07/30/14 (from the past 72 hour(s))  GLUCOSE, CAPILLARY     Status: None   Collection Time    08/08/14  8:13 AM      Result Value Ref Range   Glucose-Capillary 83  70 - 99 mg/dL  GLUCOSE, CAPILLARY     Status: Abnormal   Collection Time    08/08/14 11:33 AM      Result Value Ref Range   Glucose-Capillary 138 (*) 70 - 99 mg/dL  GLUCOSE, CAPILLARY     Status: Abnormal   Collection Time    08/08/14  2:29 PM      Result Value Ref Range   Glucose-Capillary 119 (*) 70 - 99 mg/dL   Comment 1 Documented in Chart    GLUCOSE, CAPILLARY     Status: None   Collection Time    08/08/14  8:21 PM      Result Value Ref Range   Glucose-Capillary 95  70 - 99 mg/dL   Comment 1 Notify RN    GLUCOSE, CAPILLARY     Status: Abnormal   Collection Time    08/09/14  1:49 AM      Result Value Ref Range   Glucose-Capillary 141 (*) 70 - 99 mg/dL   Comment 1 Notify RN    GLUCOSE, CAPILLARY     Status: Abnormal   Collection Time    08/09/14  8:03 AM      Result Value Ref Range   Glucose-Capillary 104 (*) 70 - 99 mg/dL   Comment 1 Notify RN    GLUCOSE, CAPILLARY     Status: None   Collection Time    08/09/14  1:41 PM      Result Value Ref Range   Glucose-Capillary 91  70 - 99 mg/dL  GLUCOSE, CAPILLARY     Status: None   Collection Time    08/09/14  8:03 PM      Result Value Ref Range   Glucose-Capillary 83  70 - 99 mg/dL   Comment 1 Notify RN    GLUCOSE, CAPILLARY     Status: None   Collection Time    08/10/14  8:28 AM      Result Value Ref Range   Glucose-Capillary 92  70 - 99 mg/dL   Comment 1 Notify RN    GLUCOSE, CAPILLARY     Status: Abnormal   Collection Time    08/10/14  9:59 AM      Result Value Ref Range   Glucose-Capillary 159 (*) 70 - 99 mg/dL  GLUCOSE, CAPILLARY     Status: None   Collection Time    08/10/14  1:35 PM      Result Value Ref Range   Glucose-Capillary 90  70 - 99 mg/dL  GLUCOSE, CAPILLARY     Status: None   Collection Time    08/10/14  7:48 PM      Result Value Ref Range   Glucose-Capillary 92  70 - 99 mg/dL   Comment 1 Notify RN       HEENT: neck dressed/ occlusive dressing Cardio: RRR and no murmur Resp: CTA  B/L and unlabored, clear.  GI: BS positive and PEG site clean. Extremity:  No Edema Skin:   Intact other than above Neuro:   more alert. Initiates more. Inconsistent attention and eye contact however.  Abnormal Sensory reduced on left difficult to assess secondary to MS, Abnormal Motor 4- LUE, 3- RLE, 2- LLE, 5/5 RUE grossly Moves RLE spontaneously., Dysarthric and Aphasic.   Musc/Skel:  Normal GEN NAD   Assessment/Plan: 1. Functional deficits secondary to left Thalamic ICH, Hydrocephalus with Right  shunt which require 3+ hours per day of interdisciplinary therapy in a comprehensive inpatient rehab setting. Physiatrist is providing close team supervision and 24 hour management of active medical problems listed below. Physiatrist and rehab team continue to assess barriers to discharge/monitor patient progress toward functional and medical goals.  FIM: FIM - Bathing Bathing Steps Patient Completed: Abdomen Bathing: 0: Activity did not occur  FIM - Upper Body Dressing/Undressing Upper body dressing/undressing steps patient completed: Put head through opening of pull over shirt/dress;Thread/unthread left sleeve of pullover shirt/dress Upper body dressing/undressing: 3: Mod-Patient completed 50-74% of tasks FIM - Lower Body Dressing/Undressing Lower body dressing/undressing: 1: Two helpers  FIM - Toileting Toileting: 0: No  continent bowel/bladder events this shift  FIM - Air cabin crew Transfers: 0-Activity did not occur  FIM - Control and instrumentation engineer Devices: Bed rails;Arm rests Bed/Chair Transfer: 0: Activity did not occur  FIM - Locomotion: Wheelchair Locomotion: Wheelchair: 1: Total Assistance/staff pushes wheelchair (Pt<25%) FIM - Locomotion: Ambulation Locomotion: Ambulation Assistive Devices: Ethelene Hal Ambulation/Gait Assistance: 1: +2 Total assist Locomotion: Ambulation: 1: Two helpers  Comprehension Comprehension Mode: Auditory Comprehension: 1-Understands basic less than 25% of the time/requires cueing 75% of the time  Expression Expression Mode: Verbal Expression Assistive Devices: 6-Talk trach valve Expression: 1-Expresses basis less than 25% of the time/requires cueing greater than 75% of the time.  Social Interaction Social Interaction Mode: Asleep Social Interaction: 1-Interacts appropriately less than 25% of the time. May be withdrawn or combative.  Problem Solving Problem Solving Mode: Asleep Problem Solving: 1-Solves basic less than 25% of the time - needs direction nearly all the time or does not effectively solve problems and may need a restraint for safety  Memory Memory Mode: Asleep Memory: 1-Recognizes or recalls less than 25% of the time/requires cueing greater than 75% of the time Medical Problem List and Plan:  1. Functional deficits secondary to Left thalamic bleed with hydrocephalus.  2. DVT Prophylaxis/Anticoagulation: Pharmaceutical: Lovenox- 3. Chronic B-knee/Pain Management: Prn oxycodone.   Voltaren gel for knees.  -poorly-compliant pain patient in my practice 4. Mood: Monitor as mentation improves.  5. Neuropsych: This patient is not capable of making decisions on his own behalf.   -continue low dose ritalin---will not push higher given CV history 6. Skin/Wound Care:   Continue air mattress overlay.  .  7. DM type 2: Will  monitor BS with ac/hs checks. Continue Levemir bid and adjust as indicated.  -reasonable control still  8. Enterobacter tracheobronchitis: abx completed  -decannulated without any problems  7. HTN/CAD: continue Norvasc daily and metoprolol bid.  8. ABLA: on iron supplement.   9.  Low grade fever temp--likely atelectasis  -IS, OOB  LOS (Days) 12 A FACE TO FACE EVALUATION WAS PERFORMED  SWARTZ,ZACHARY T 08/11/2014, 8:01 AM

## 2014-08-12 ENCOUNTER — Inpatient Hospital Stay (HOSPITAL_COMMUNITY): Payer: Medicare Other

## 2014-08-12 ENCOUNTER — Encounter (HOSPITAL_COMMUNITY): Payer: Medicare Other | Admitting: *Deleted

## 2014-08-12 ENCOUNTER — Inpatient Hospital Stay (HOSPITAL_COMMUNITY): Payer: Medicare Other | Admitting: Speech Pathology

## 2014-08-12 LAB — URINALYSIS, ROUTINE W REFLEX MICROSCOPIC
Bilirubin Urine: NEGATIVE
Glucose, UA: NEGATIVE mg/dL
Hgb urine dipstick: NEGATIVE
KETONES UR: NEGATIVE mg/dL
Leukocytes, UA: NEGATIVE
NITRITE: NEGATIVE
PH: 8 (ref 5.0–8.0)
PROTEIN: NEGATIVE mg/dL
Specific Gravity, Urine: 1.007 (ref 1.005–1.030)
Urobilinogen, UA: 0.2 mg/dL (ref 0.0–1.0)

## 2014-08-12 LAB — GLUCOSE, CAPILLARY
GLUCOSE-CAPILLARY: 101 mg/dL — AB (ref 70–99)
GLUCOSE-CAPILLARY: 154 mg/dL — AB (ref 70–99)
GLUCOSE-CAPILLARY: 85 mg/dL (ref 70–99)
GLUCOSE-CAPILLARY: 96 mg/dL (ref 70–99)

## 2014-08-12 NOTE — Progress Notes (Addendum)
Subjective/Complaints:   Awake and alert. In no distress. Denies pain this morning Review of Systems -remains limited due to cognitive status Objective: Vital Signs: Blood pressure 146/87, pulse 91, temperature 98.6 F (37 C), temperature source Oral, resp. rate 18, weight 112.2 kg (247 lb 5.7 oz), SpO2 100.00%. No results found. Results for orders placed during the hospital encounter of 07/30/14 (from the past 72 hour(s))  GLUCOSE, CAPILLARY     Status: None   Collection Time    08/09/14  1:41 PM      Result Value Ref Range   Glucose-Capillary 91  70 - 99 mg/dL  GLUCOSE, CAPILLARY     Status: None   Collection Time    08/09/14  8:03 PM      Result Value Ref Range   Glucose-Capillary 83  70 - 99 mg/dL   Comment 1 Notify RN    GLUCOSE, CAPILLARY     Status: None   Collection Time    08/10/14  8:28 AM      Result Value Ref Range   Glucose-Capillary 92  70 - 99 mg/dL   Comment 1 Notify RN    GLUCOSE, CAPILLARY     Status: Abnormal   Collection Time    08/10/14  9:59 AM      Result Value Ref Range   Glucose-Capillary 159 (*) 70 - 99 mg/dL  GLUCOSE, CAPILLARY     Status: None   Collection Time    08/10/14  1:35 PM      Result Value Ref Range   Glucose-Capillary 90  70 - 99 mg/dL  GLUCOSE, CAPILLARY     Status: None   Collection Time    08/10/14  7:48 PM      Result Value Ref Range   Glucose-Capillary 92  70 - 99 mg/dL   Comment 1 Notify RN    GLUCOSE, CAPILLARY     Status: None   Collection Time    08/11/14  7:46 AM      Result Value Ref Range   Glucose-Capillary 95  70 - 99 mg/dL  GLUCOSE, CAPILLARY     Status: None   Collection Time    08/11/14  1:41 PM      Result Value Ref Range   Glucose-Capillary 80  70 - 99 mg/dL  GLUCOSE, CAPILLARY     Status: None   Collection Time    08/11/14  7:42 PM      Result Value Ref Range   Glucose-Capillary 87  70 - 99 mg/dL  GLUCOSE, CAPILLARY     Status: Abnormal   Collection Time    08/11/14 11:53 PM      Result Value Ref  Range   Glucose-Capillary 154 (*) 70 - 99 mg/dL   Comment 1 Notify RN    GLUCOSE, CAPILLARY     Status: Abnormal   Collection Time    08/12/14  7:11 AM      Result Value Ref Range   Glucose-Capillary 101 (*) 70 - 99 mg/dL     HEENT: neck clean Cardio: RRR and no murmur Resp: CTA B/L and unlabored, clear.  GI: BS positive and PEG site clean. Extremity:  Bilateral pedal edema Skin:   Intact other than above Neuro:   more alert. Initiates more. Inconsistent attention and eye contact however.  Abnormal Sensory reduced on left difficult to assess secondary to MS, Abnormal Motor 4- LUE, 3- RLE, 2- LLE, 5/5 RUE grossly Moves RLE spontaneously. Can perseverate on particular movements,  has a difficult time shifting focus Musc/Skel:  Normal GEN NAD   Assessment/Plan: 1. Functional deficits secondary to left Thalamic ICH, Hydrocephalus with Right  shunt which require 3+ hours per day of interdisciplinary therapy in a comprehensive inpatient rehab setting. Physiatrist is providing close team supervision and 24 hour management of active medical problems listed below. Physiatrist and rehab team continue to assess barriers to discharge/monitor patient progress toward functional and medical goals.  FIM: FIM - Bathing Bathing Steps Patient Completed: Right Arm;Left Arm;Chest;Front perineal area Bathing: 2: Max-Patient completes 3-4 64f 10 parts or 25-49%  FIM - Upper Body Dressing/Undressing Upper body dressing/undressing steps patient completed: Put head through opening of pull over shirt/dress;Thread/unthread left sleeve of pullover shirt/dress Upper body dressing/undressing: 3: Mod-Patient completed 50-74% of tasks FIM - Lower Body Dressing/Undressing Lower body dressing/undressing: 1: Two helpers  FIM - Toileting Toileting: 0: No continent bowel/bladder events this shift  FIM - Air cabin crew Transfers: 0-Activity did not occur  FIM - Buyer, retail Devices: Arm rests Bed/Chair Transfer: 1: Two helpers  FIM - Locomotion: Wheelchair Locomotion: Wheelchair: 1: Total Assistance/staff pushes wheelchair (Pt<25%) FIM - Locomotion: Ambulation Locomotion: Ambulation Assistive Devices: Nutritional therapist Ambulation/Gait Assistance: 1: +2 Total assist Locomotion: Ambulation: 1: Two helpers  Comprehension Comprehension Mode: Auditory Comprehension: 2-Understands basic 25 - 49% of the time/requires cueing 51 - 75% of the time  Expression Expression Mode: Verbal Expression Assistive Devices: 6-Talk trach valve Expression: 2-Expresses basic 25 - 49% of the time/requires cueing 50 - 75% of the time. Uses single words/gestures.  Social Interaction Social Interaction Mode: Asleep Social Interaction: 2-Interacts appropriately 25 - 49% of time - Needs frequent redirection.  Problem Solving Problem Solving Mode: Asleep Problem Solving: 1-Solves basic less than 25% of the time - needs direction nearly all the time or does not effectively solve problems and may need a restraint for safety  Memory Memory Mode: Asleep Memory: 1-Recognizes or recalls less than 25% of the time/requires cueing greater than 75% of the time Medical Problem List and Plan:  1. Functional deficits secondary to Left thalamic bleed with hydrocephalus.  2. DVT Prophylaxis/Anticoagulation: Pharmaceutical: Lovenox-  -TEDs for edema, elevation  -check dopplers today 3. Chronic B-knee/Pain Management: Prn oxycodone.   Voltaren gel for knees.  -poorly-compliant pain patient in my practice 4. Mood: Monitor as mentation improves.  5. Neuropsych: This patient is not capable of making decisions on his own behalf.   -continue low dose ritalin---will not push higher given CV history 6. Skin/Wound Care:   Continue air mattress overlay.  .  7. DM type 2: Will monitor BS with ac/hs checks. Continue Levemir bid and adjust as indicated.  -reasonable control still  8. Enterobacter  tracheobronchitis: abx completed  -decannulated   7. HTN/CAD: continue Norvasc daily and metoprolol bid.  8. ABLA: on iron supplement.   9.  Low grade fever temp--likely atelectasis  -continue IS, OOB  LOS (Days) 13 A FACE TO FACE EVALUATION WAS PERFORMED  Tony Davis 08/12/2014, 8:10 AM

## 2014-08-12 NOTE — Progress Notes (Signed)
Social Work Patient ID: [name], male   DOB: [birth year], 53 y.o.   MRN: [id number]  Spoke with pt's wife yesterday afternoon via phone to review team conference.  Wife aware and understands that targeted d/c date set for 9/25 with moderate assist w/c level goals.  Stressed to her that his care is anticipated to be significant and that a confirmed caregiver plan is needed.  Wife continues to plan for pt to return home as she tries to coordinate and confirm assist from family.  Have explained that we will need several days of family education prior to d/c.  Have confirmed that wife has started a Medicaid application as this will be needed if plan changes to SNF. Continue to follow for d/c planning and support needs.    Viviana Trimble, LCSW

## 2014-08-12 NOTE — Progress Notes (Signed)
Occupational Therapy Session Note  Patient Details  Name: Tony Davis MRN: [id number] Date of Birth: 1961/11/19  Today's Date: 08/12/2014 OT Individual Time: 2836-6294 OT Individual Time Calculation (min): 56 min    Short Term Goals: Week 2:  OT Short Term Goal 1 (Week 2): Pt will maintain sitting balance EOB for 5 mins with min A while performing functional tasks OT Short Term Goal 2 (Week 2): Pt will perform UB dressing with mod A while seated OT Short Term Goal 3 (Week 2): Pt will perform LB dressing with max A OT Short Term Goal 4 (Week 2): Pt will engage in 5 mins of functional activity before taking a rest break to increase endurance OT Short Term Goal 5 (Week 2): Pt will sustain attention for 60 seconds with min verbal cues during funcitonal tasks  Skilled Therapeutic Interventions/Progress Updates:    Pt engaged in BADL retraining including dressing with sit<>stand from EOB.  Pt required min A for supine->sit EOB for dressing tasks.  Pt required max verbal cues to maintain sitting balance at EOB and varying closed S to max A to maintain sitting balance.  Pt exhibited increased difficulty maintaining balance when engaged in donning shirt.  Pt able to self correct approx 50% of time.  Pt donned shirt with min A this morning with extra time to complete task.  Pt stood with tot A + 2 (pt =75%) from EOB to pull up pants.  Pt assisted with pulling up pants.  Pt performed squat pivot transfer to w/c with tot A + 2.  Pt engaged in grooming tasks (washing face and brushing hair) while seated in tilt-in-space w/c.  Pt followed one step commands with extra time this morning.  Focus on following one step commands, task initiation, sequencing, attention to task, sitting balance, sit<>stand, standing balance, activity tolerance, and safety awareness.  Therapy Documentation Precautions:  Precautions Precautions: Fall;Other (comment) Precaution Comments: PEG/abdominal precautions, trach, Bilateral  thigh high TED hose  Restrictions Weight Bearing Restrictions: No Pain: Pain Assessment Pain Assessment: No/denies pain  See FIM for current functional status  Therapy/Group: Individual Therapy  Leroy Libman 08/12/2014, 9:03 AM

## 2014-08-12 NOTE — Progress Notes (Signed)
Speech Language Pathology Daily Session Note  Patient Details  Name: Tony Davis MRN: [id number] Date of Birth: 10-14-1961  Today's Date: 08/12/2014 SLP Individual Time: 1400-1500 SLP Individual Time Calculation (min): 60 min  Short Term Goals: Week 2: SLP Short Term Goal 1 (Week 2): Patient will initiate verbal response to yes/no questions with Max multimodal cues. SLP Short Term Goal 2 (Week 2): Patient will consume trials of thin liquids via cup with no overt s/s of aspiration.   SLP Short Term Goal 3 (Week 2): Patient will follow basic, 1-step directions during self-care tasks with Max multimodal cues. SLP Short Term Goal 4 (Week 2): Patient will demonstrate use of call bell with Max multimodal cues. SLP Short Term Goal 5 (Week 2): Patient will sustain attention to basic self-care task for ~53minute with Max multimodal cues.   Skilled Therapeutic Interventions: Skilled treatment session focused on addressing dysphagia and cognition goals. SLP facilitated session by providing set-up assist, extra time and Max assist multimodal cues to initiate self-feeding of puree and water.  Patient exhibited intermittent wet vocal quality as well as times where there were no attempts to vocalize per SLP request.  Given severity of cognitive deficits SLP unable to determine safest PO option at bedside; recommend an objective assessment.       FIM:  Comprehension Comprehension Mode: Auditory Comprehension: 2-Understands basic 25 - 49% of the time/requires cueing 51 - 75% of the time Expression Expression Mode: Verbal Expression: 2-Expresses basic 25 - 49% of the time/requires cueing 50 - 75% of the time. Uses single words/gestures. Social Interaction Social Interaction: 2-Interacts appropriately 25 - 49% of time - Needs frequent redirection. Problem Solving Problem Solving: 1-Solves basic less than 25% of the time - needs direction nearly all the time or does not effectively solve problems and may  need a restraint for safety Memory Memory: 1-Recognizes or recalls less than 25% of the time/requires cueing greater than 75% of the time FIM - Eating Eating Activity: 4: Help with picking up utensils;4: Help with managing cup/glass (with trials )  Pain Pain Assessment Pain Assessment: No/denies pain  Therapy/Group: Individual Therapy  Carmelia Roller., [place] [phone]  [place] 08/12/2014, 4:31 PM

## 2014-08-12 NOTE — Progress Notes (Signed)
Physical Therapy Session Note  Patient Details  Name: Tony Davis MRN: [id number] Date of Birth: August 13, 1961  Today's Date: 08/12/2014 PT Individual Time: 0856-1000 PT Individual Time Calculation (min): 64 min   Short Term Goals: Week 1:  PT Short Term Goal 1 (Week 1): Pt will transfer out of bed to w/c using maxi sky lift PT Short Term Goal 1 - Progress (Week 1): Met PT Short Term Goal 2 (Week 1): Pt will tolerate sitting up in tilt in space w/c for 2 hours. PT Short Term Goal 2 - Progress (Week 1): Met PT Short Term Goal 3 (Week 1): Pt will roll in bed req 1 person assist PT Short Term Goal 3 - Progress (Week 1): Progressing toward goal PT Short Term Goal 4 (Week 1): Pt will tolerate supine/side lie to sit transfer req 2 person assist.  PT Short Term Goal 4 - Progress (Week 1): Met PT Short Term Goal 5 (Week 1): Pt will demonstrate sustained focus of an activity for 30 seconds.  PT Short Term Goal 5 - Progress (Week 1): Not met Week 2:  PT Short Term Goal 1 (Week 2): Pt will demonstrate focused attention to therapeutic task for 30 seconds w/ max multimodal cues PT Short Term Goal 2 (Week 2): Pt to perform bed mobility w/ max Ax1person PT Short Term Goal 3 (Week 2): Pt to transfer bed<>w/c with Ax2 persons without use of lift equipment PT Short Term Goal 4 (Week 2): Pt to demonstrate sitting balance with min guard A, 50% of time PT Short Term Goal 5 (Week 2): Pt will ambulate 100' w/ Ax2 persons  Skilled Therapeutic Interventions/Progress Updates:  Patient in wheelchair upon entering room. Patient pushed in wheelchair to gym. Patient sit to stand with max assist and ambulated with eva walker and +2 assist 65 feet x 1 and 70 feet x 1. Patient with flexed posture and required cueing to stay closer to walker. Patient also with very narrow BOS and requiring physical assist to increase base during ambulation. Patient worked on sit to stand from mat and after 3 attempts was able to stand with  mod assist of 1. Patient stood and performed reaching tasks with horseshoes and basketball goal with min to mod assist. Patient had incontinent episode of bowels and was taken back to room to get cleaned up. Patient max assist (lifting and lowering) stand pivot transfer mat to wheelchair to bed with another +2 for safety. Assisted NT with bed mobility/rolling side to side for hygiene and changing brief. Patient left in bed with NT present.  Therapy Documentation Precautions:  Precautions Precautions: Fall;Other (comment) Precaution Comments: PEG/abdominal precautions, trach, Bilateral thigh high TED hose  Restrictions Weight Bearing Restrictions: No Pain: Pain Assessment Pain Assessment: No/denies pain Locomotion : Ambulation Ambulation/Gait Assistance: 1: +2 Total assist   See FIM for current functional status  Therapy/Group: Individual Therapy  Sanjuana Letters 08/12/2014, 1:47 PM

## 2014-08-13 ENCOUNTER — Encounter (HOSPITAL_COMMUNITY): Payer: Medicare Other | Admitting: Speech Pathology

## 2014-08-13 ENCOUNTER — Inpatient Hospital Stay (HOSPITAL_COMMUNITY): Payer: Medicare Other

## 2014-08-13 ENCOUNTER — Inpatient Hospital Stay (HOSPITAL_COMMUNITY): Payer: Medicare Other | Admitting: Occupational Therapy

## 2014-08-13 DIAGNOSIS — M79609 Pain in unspecified limb: Secondary | ICD-10-CM

## 2014-08-13 DIAGNOSIS — M7989 Other specified soft tissue disorders: Secondary | ICD-10-CM

## 2014-08-13 LAB — URINE CULTURE: Colony Count: >=100000

## 2014-08-13 LAB — GLUCOSE, CAPILLARY
GLUCOSE-CAPILLARY: 88 mg/dL (ref 70–99)
GLUCOSE-CAPILLARY: 93 mg/dL (ref 70–99)
Glucose-Capillary: 86 mg/dL (ref 70–99)

## 2014-08-13 MED ORDER — HYDROCHLOROTHIAZIDE 25 MG PO TABS: 25.0000 mg | ORAL_TABLET | Freq: Every day | ORAL | Status: DC

## 2014-08-13 MED ORDER — HYDROCHLOROTHIAZIDE 10 MG/ML ORAL SUSPENSION
25.0000 mg | Freq: Every day | ORAL | Status: DC
  Administered 2014-08-13 – 2014-08-23 (×11): 25 mg
  Filled 2014-08-13 (×14): qty 2.5

## 2014-08-13 NOTE — Plan of Care (Signed)
Problem: RH Balance Goal: LTG Patient will maintain dynamic standing balance (PT) LTG: Patient will maintain dynamic standing balance with assistance during mobility activities (PT)  Upgraded due to increased alertness and balance  Problem: RH Ambulation Goal: LTG Patient will ambulate in controlled environment (PT) LTG: Patient will ambulate in a controlled environment, # of feet with assistance (PT).  Upgraded due to increased balance and activity tolerance.   Problem: RH Memory Goal: LTG Patient demonstrate ability for day to day recall (PT) LTG: Patient will demonstrate ability for day to day recall/carryover during mobility activities with assist (PT)  Downgraded due to anticipated level of assist at time of d/c.

## 2014-08-13 NOTE — Procedures (Signed)
Objective Swallowing Evaluation: Modified Barium Swallowing Study  Patient Details  Name: Tony Davis MRN: [id number] Date of Birth: January 07, 1961  Today's Date: 08/13/2014 Time: 0905 -53     Past Medical History:  Past Medical History  Diagnosis Date  . GERD (gastroesophageal reflux disease)   . HLD (hyperlipidemia)   . HTN (hypertension)   . Hematuria 8/12  . PFO (patent foramen ovale)     echo 8/12: EF 55-65%, grade 2 diast dysfnx; +PFO on bubble study;  TEE 4/13: EF normal, atrial septum with suspicion for interatrial septum fenestrations without flow across and few large bubbles noted in LA.  This was not felt to require closure   . CAD (coronary artery disease)     a. s/p promus DES circumflex artery 10/13/10. b. Cath 04/2013: stable disease but possible small vessel disease -Imdur added.  Marland Kitchen DVT (deep venous thrombosis) 2013    "right"   . TIA (transient ischemic attack)     "1-2; both after the one in 02/2010" (09/25/2013)  . OSA on CPAP   . Type II diabetes mellitus     June 2014 AIC 6.2%/notes 09/24/2013; pt denies this hx on 09/25/2013  . Arthritis     "knees" (09/25/2013)  . Rheumatoid arthritis(714.0)   . CVA (cerebral vascular accident) 02/2010    "lost peripheral vision when I had the stroke; some memory issues since" (09/25/2013)   Past Surgical History:  Past Surgical History  Procedure Laterality Date  . Knee arthroscopy Bilateral 2000's    2 on left and 3 on rt  . Tee without cardioversion  03/21/2012    Procedure: TRANSESOPHAGEAL ECHOCARDIOGRAM (TEE);  Surgeon: Fay Records, MD;  Location: [facility] ENDOSCOPY;  Service: Cardiovascular;  Laterality: N/A;  . Coronary angioplasty with stent placement  2012    "1" (09/24/2013)   HPI:  [name] is a 53 year old male with h/o, CVA '11, PFO-on chronic coumadin who was admitted to OSH on 06/13/14 with left basal ganglia hemorrhage with intraventricular extension and hydrocephalus requiring right crani for  evacuation as well as VP shunt placement. Hospital course complicated by acute respiratory failure requiring tracheostomy as well as PEG placement. Patient with subsequent right hemiparesis, lethargy, nonverbal, requiring noxious stimuli for arousal, difficulty tracking, as well as difficulty following commands. He was transferred to [facility] on 07/08/14 for vent wean and rehab. Initiated on Dys 2 diet with thin liquids per MBS at [facility]; however downgraded to NPO on CIR due to decreased alertness and inconsistent s/s of aspiration noted at bedside.  Lurline Idol downsized to CFS # 6 on 08/15 and able to tolerate PMSV. Decannulated 8/31.  Repeat MBS ordered today to objectively determine readiness for diet initiation.       Recommendation/Prognosis  Clinical Impression:   Dysphagia Diagnosis: Mild pharyngeal phase dysphagia;Mild oral phase dysphagia Pt presents with a sensorimotor based dysphagia which is exacerbated by pt's significant cognitive deficits.  Pt demonstrates with generalized lingual weakness resulting in prolonged oral transit and poor control for transiting boluses to the oropharynx, which in combination with decreased sensation resulted in premature spillage of all materials into the pharynx with pharyngeal swallow triggered at the pyriform sinuses for thin liquids and at the vallecula for purees and solid cracker consistencies.  Delayed swallow initiation resulted in one instance of silent penetration with small cup sips of thin liquids which cleared with cuing for throat clear; no other aspiration or penetration visualized on study with trials of large, consecutive cup  sips of thin liquids, purees, or solid consistencies.  Recommend re-initiating diet of dys 2 solids and thin liquids with full supervision for safety due to cognitive impairments with primary SLP at next available appointment.     Swallow Evaluation Recommendations:  Diet Recommendations: Dysphagia 2 (Fine chop);Thin liquid (to be  initiated with primary SLP at next available appointment ) Liquid Administration via: Cup Medication Administration: Whole meds with puree Supervision: Full supervision/cueing for compensatory strategies Compensations: Slow rate;Small sips/bites;Check for pocketing Postural Changes and/or Swallow Maneuvers: Seated upright 90 degrees Oral Care Recommendations: Oral care BID    Prognosis:  Prognosis for Safe Diet Advancement: Good   Individuals Consulted: Consulted and Agree with Results and Recommendations: Patient unable/family or caregiver not available      SLP Assessment/Plan  Plan:   See care plan    Short Term Goals: Week 1: SLP Short Term Goal 1 (Week 1): Patient will initiate verbal response to yes/no and open-ended questions within 5-10 seconds with moderate verbal/tactile cues. SLP Short Term Goal 1 - Progress (Week 1): Progressing toward goal SLP Short Term Goal 2 (Week 1): Patient will tolerate current diet with no overt s/s aspiration. SLP Short Term Goal 2 - Progress (Week 1): Revised due to lack of progress SLP Short Term Goal 3 (Week 1): Patient will tolerate PMV placement for all waking hours with no significant changes in SpO2 saturation. SLP Short Term Goal 3 - Progress (Week 1): Met SLP Short Term Goal 4 (Week 1): Patient will follow basic level, 1-step directions with 75% accuracy. SLP Short Term Goal 4 - Progress (Week 1): Progressing toward goal    General: Date of Onset: 06/13/14 Type of Study: Modified Barium Swallowing Study Reason for Referral: Objectively evaluate swallowing function Previous Swallow Assessment: BSE when admitted to CIR Diet Prior to this Study: NPO Temperature Spikes Noted: No Respiratory Status: Room air Behavior/Cognition: Confused;Requires cueing;Decreased sustained attention;Alert Self-Feeding Abilities: Needs assist Patient Positioning: Upright in chair Baseline Vocal Quality: Low vocal intensity Volitional Cough: Weak      Reason for Referral:   Objectively evaluate swallowing function    Oral Phase: Oral Preparation/Oral Phase Oral Phase: Impaired Oral - Thin Oral - Thin Cup: Lingual pumping;Weak lingual manipulation;Reduced posterior propulsion;Delayed oral transit Oral - Solids Oral - Puree: Lingual pumping;Delayed oral transit;Weak lingual manipulation;Reduced posterior propulsion;Lingual/palatal residue Oral - Regular: Impaired mastication;Lingual pumping;Lingual/palatal residue;Weak lingual manipulation;Reduced posterior propulsion Oral - Pill: Reduced posterior propulsion;Delayed oral transit   Pharyngeal Phase:  Pharyngeal Phase Pharyngeal Phase: Impaired Pharyngeal - Thin Pharyngeal - Thin Cup: Delayed swallow initiation;Premature spillage to pyriform sinuses;Penetration/Aspiration during swallow;Reduced tongue base retraction Penetration/Aspiration details (thin cup): Material enters airway, remains ABOVE vocal cords and not ejected out Pharyngeal - Solids Pharyngeal - Puree: Delayed swallow initiation;Pharyngeal residue - valleculae;Reduced tongue base retraction;Premature spillage to valleculae Pharyngeal - Regular: Delayed swallow initiation;Premature spillage to valleculae;Reduced tongue base retraction;Pharyngeal residue - valleculae Pharyngeal - Pill: Delayed swallow initiation;Premature spillage to valleculae;Reduced tongue base retraction      GN        Windell Moulding, M.A. CCC-SLP  Rayyan Orsborn, Elmyra Ricks L 08/13/2014, 10:01 AM

## 2014-08-13 NOTE — Progress Notes (Signed)
Chart and note reviewed. Agree with note.  Anara Cowman, RD, LDN, CNSC Pager [phone] After Hours Pager [phone]   

## 2014-08-13 NOTE — Progress Notes (Addendum)
VASCULAR LAB PRELIMINARY  PRELIMINARY  PRELIMINARY  PRELIMINARY  Bilateral lower extremity venous duplex completed.    Preliminary report:  Bilateral - Positive for a deep vein thrombosis age undetermined coursing from the popliteal vein through the femoral and common femoral veins. There is also a superficial thrombus of the saphenofemoral junction.No evidence of a Baker's cyst bilaterally  Julianny Milstein, RVS 08/13/2014, 9:07 AM

## 2014-08-13 NOTE — Progress Notes (Signed)
NUTRITION FOLLOW-UP  DOCUMENTATION CODES Per approved criteria  -Obesity Unspecified   INTERVENTION: -Continue bolus tube feeding of Vital 1.5 formula at 450 ml TID with 30 ml Prostat once daily per PEG to provide 2125 kcal, 106 grams of protein, and 1026 ml of H2O.   -Continue free water flushes of 200 ml 5 times daily.  -Tube feeding regimen provides 100% of estimated nutrition needs.  -Recommend obtaining new weight to assess weight trends.  -Will continue to monitor  NUTRITION Dx: Inadequate oral intake related to inability to eat as evidenced by NPO status; Ongoing  Goal: Pt to meet >/= 90% of their estimated nutrition needs, met  Monitor:  TF tolerance, labs, weights, I/O's  53 y.o. male  Admitting Dx: Basal Ganglia Hemorrhage  ASSESSMENT: Tony Davis is a 53 year old male with h/o DM type 2, CAD, OSA, chronic pain bilateral knees, CVA '11, PFO-on chronic coumadin who was admitted to OSH on 06/13/14 with left basal ganglia hemorrhage with intraventricular extension and hydrocephalus requiring right crani for evacuation as well as VP shunt placement Hospital course complicated by acute respiratory failure requiring tracheostomy as well as PEG placement, BLE DVT requiring IVC placement as well as fevers due to enterobacter PNA. Patient with subsequent right hemiparesis, lethargy, nonverbal, requiring noxious stimuli for arousal, difficulty tracking, dysconjugate gaze as well as difficulty following commands  8/22-Pt endorsed decreased appetite, however was unable to determine when decreased PO intake began -Is currently receiving feeding assistance, RD viewed breakfast tray with <25% completion. Pt had only consumed <50% of MagicCup protein supplement -Weight appears to have maintained stable per previous medical records, fluctuates between 270-278 lbs in past 3 months (2.8% body weight, non-severe for time frame) -Receiving Vital 1.5 at nocturnal feeds of 6pm-6am at 65  ml/hr w/Pro-stat TID. MD requesting advancing to bolus feeds until PO intake improves -Modified regimen to Vital 1.5 at 250 ml TID between meals  8/24-Dietitian was notified for initiation of full bolus tube feedings. Spoke with RN, pt has been tolerating the bolus tube feeding. Spoke with pt, pt reports having stomach pains, which had started yesterday.  -Pt was receiving bolus tube feeding:Vital 1.5 @ 250 ml TID with 30 ml Prostat via PEG TID. Tube feeding regimen provides 1425 kcal (70% of needs), 96 grams of protein (96% est protein needs), and 573 ml of H2O.  -Increased orders to Vital 1.5 to 450 ml TID and provide 30 ml Prostat per PEG once daily to provide 2125 kcal, 106 grams of protein, and 1026 ml of H2O.    8/25- Spoke with RN, she reports pt has been tolerating the bolus tube feeding well. Pt did have two large, loose bowel movements last night, however had not had one today. Spoke with pt, pt reports he continues to have abdominal pains. Pains however were not serious. Spoke with PA about current TF regimen, discussed on continuing with current orders.  8/28- Pt reports he no longer has abdominal pains. Per RN, pt has been tolerating his tube feeding well.  9/4- Spoke with RN, pt was been tolerating his tube feeding well. She reports however that he has been having loose stools once a day. Pt with no stomach pains.  Pt is receiving bolus tube feedings of Vital 1.5 formula at 450 ml TID with 30 ml Prostat once daily per PEG providing 2125 kcal, 106 grams of protein, and 1026 ml of H2O.   Height: Ht Readings from Last 1 Encounters:  07/30/14 _0  (  1.803 m)   Weight: Wt Readings from Last 1 Encounters:  08/04/14 247 lb 5.7 oz (112.2 kg)   BMI:  Body mass index is 34.51 kg/(m^2). Class I obesity  Re-Estimated Nutritional Needs: Kcal: 2000-2200 Protein: 100-110 gram Fluid: >/=2000 ml/daily  Skin:+ 1 gen edema  Diet Order: NPO   Intake/Output Summary (Last 24 hours) at  08/13/14 1459 Last data filed at 08/13/14 0605  Gross per 24 hour  Intake      0 ml  Output    171 ml  Net   -171 ml    Last BM: 9/3  Labs:  No results found for this basename: NA, K, CL, CO2, BUN, CREATININE, CALCIUM, MG, PHOS, GLUCOSE,  in the last 168 hours  CBG (last 3)   Recent Labs  08/12/14 2050 08/13/14 0731 08/13/14 1410  GLUCAP 96 86 88    Scheduled Meds: . amLODipine  5 mg Oral Daily  . antiseptic oral rinse  7 mL Mouth Rinse q12n4p  . atorvastatin  20 mg Oral q1800  . chlorhexidine  15 mL Mouth Rinse BID  . diclofenac sodium  2 g Topical QID  . enoxaparin (LOVENOX) injection  40 mg Subcutaneous Q24H  . famotidine  20 mg Oral Daily  . feeding supplement (PRO-STAT SUGAR FREE 64)  30 mL Per Tube Q1200  . feeding supplement (VITAL 1.5 CAL)  450 mL Per Tube TID  . ferrous sulfate  300 mg Per Tube BID WC  . free water  200 mL Per Tube 5 X Daily  . hydrochlorothiazide  25 mg Per Tube Daily  . insulin aspart  0-15 Units Subcutaneous TID WC  . insulin detemir  40 Units Subcutaneous BID  . methylphenidate  5 mg Per Tube BID  . metoprolol tartrate  75 mg Oral BID  . saccharomyces boulardii  250 mg Oral BID    Continuous Infusions:   Past Medical History  Diagnosis Date  . GERD (gastroesophageal reflux disease)   . HLD (hyperlipidemia)   . HTN (hypertension)   . Hematuria 8/12  . PFO (patent foramen ovale)     echo 8/12: EF 55-65%, grade 2 diast dysfnx; +PFO on bubble study;  TEE 4/13: EF normal, atrial septum with suspicion for interatrial septum fenestrations without flow across and few large bubbles noted in LA.  This was not felt to require closure   . CAD (coronary artery disease)     a. s/p promus DES circumflex artery 10/13/10. b. Cath 04/2013: stable disease but possible small vessel disease -Imdur added.  Marland Kitchen DVT (deep venous thrombosis) 2013    "right"   . TIA (transient ischemic attack)     "1-2; both after the one in 02/2010" (09/25/2013)  . OSA on  CPAP   . Type II diabetes mellitus     June 2014 AIC 6.2%/notes 09/24/2013; pt denies this hx on 09/25/2013  . Arthritis     "knees" (09/25/2013)  . Rheumatoid arthritis(714.0)   . CVA (cerebral vascular accident) 02/2010    "lost peripheral vision when I had the stroke; some memory issues since" (09/25/2013)    Past Surgical History  Procedure Laterality Date  . Knee arthroscopy Bilateral 2000's    2 on left and 3 on rt  . Tee without cardioversion  03/21/2012    Procedure: TRANSESOPHAGEAL ECHOCARDIOGRAM (TEE);  Surgeon: Fay Records, MD;  Location: [facility] ENDOSCOPY;  Service: Cardiovascular;  Laterality: N/A;  . Coronary angioplasty with stent placement  2012    "  1" (09/24/2013)   Kallie Locks, MS, Provisional LDN Pager # [phone] After hours/ weekend pager # [phone]

## 2014-08-13 NOTE — Progress Notes (Signed)
Subjective/Complaints:   More alert as a whole. No specific new issues. Review of Systems -remains limited due to cognitive status   Objective: Vital Signs: Blood pressure 146/91, pulse 93, temperature 98.6 F (37 C), temperature source Oral, resp. rate 18, weight 112.2 kg (247 lb 5.7 oz), SpO2 93.00%. No results found. Results for orders placed during the hospital encounter of 07/30/14 (from the past 72 hour(s))  GLUCOSE, CAPILLARY     Status: None   Collection Time    08/10/14  8:28 AM      Result Value Ref Range   Glucose-Capillary 92  70 - 99 mg/dL   Comment 1 Notify RN    GLUCOSE, CAPILLARY     Status: Abnormal   Collection Time    08/10/14  9:59 AM      Result Value Ref Range   Glucose-Capillary 159 (*) 70 - 99 mg/dL  GLUCOSE, CAPILLARY     Status: None   Collection Time    08/10/14  1:35 PM      Result Value Ref Range   Glucose-Capillary 90  70 - 99 mg/dL  GLUCOSE, CAPILLARY     Status: None   Collection Time    08/10/14  7:48 PM      Result Value Ref Range   Glucose-Capillary 92  70 - 99 mg/dL   Comment 1 Notify RN    GLUCOSE, CAPILLARY     Status: None   Collection Time    08/11/14  7:46 AM      Result Value Ref Range   Glucose-Capillary 95  70 - 99 mg/dL  GLUCOSE, CAPILLARY     Status: None   Collection Time    08/11/14  1:41 PM      Result Value Ref Range   Glucose-Capillary 80  70 - 99 mg/dL  GLUCOSE, CAPILLARY     Status: None   Collection Time    08/11/14  7:42 PM      Result Value Ref Range   Glucose-Capillary 87  70 - 99 mg/dL  GLUCOSE, CAPILLARY     Status: Abnormal   Collection Time    08/11/14 11:53 PM      Result Value Ref Range   Glucose-Capillary 154 (*) 70 - 99 mg/dL   Comment 1 Notify RN    GLUCOSE, CAPILLARY     Status: Abnormal   Collection Time    08/12/14  7:11 AM      Result Value Ref Range   Glucose-Capillary 101 (*) 70 - 99 mg/dL  URINALYSIS, ROUTINE W REFLEX MICROSCOPIC     Status: Abnormal   Collection Time    08/12/14   9:33 AM      Result Value Ref Range   Color, Urine YELLOW  YELLOW   APPearance HAZY (*) CLEAR   Specific Gravity, Urine 1.007  1.005 - 1.030   pH 8.0  5.0 - 8.0   Glucose, UA NEGATIVE  NEGATIVE mg/dL   Hgb urine dipstick NEGATIVE  NEGATIVE   Bilirubin Urine NEGATIVE  NEGATIVE   Ketones, ur NEGATIVE  NEGATIVE mg/dL   Protein, ur NEGATIVE  NEGATIVE mg/dL   Urobilinogen, UA 0.2  0.0 - 1.0 mg/dL   Nitrite NEGATIVE  NEGATIVE   Leukocytes, UA NEGATIVE  NEGATIVE   Comment: MICROSCOPIC NOT DONE ON URINES WITH NEGATIVE PROTEIN, BLOOD, LEUKOCYTES, NITRITE, OR GLUCOSE <1000 mg/dL.  GLUCOSE, CAPILLARY     Status: None   Collection Time    08/12/14  1:22 PM  Result Value Ref Range   Glucose-Capillary 85  70 - 99 mg/dL  GLUCOSE, CAPILLARY     Status: None   Collection Time    08/12/14  8:50 PM      Result Value Ref Range   Glucose-Capillary 96  70 - 99 mg/dL     HEENT: neck clean Cardio: RRR and no murmur Resp: CTA B/L and unlabored, clear.  GI: BS positive and PEG site clean. Extremity:  Bilateral pedal edema Skin:   Intact other than above Neuro:   more alert. Initiates more. Inconsistent attention and eye contact however.  Abnormal Sensory reduced on left difficult to assess secondary to MS, Abnormal Motor 4- LUE, 3- RLE, 2- LLE, 5/5 RUE grossly Moves RLE spontaneously. Can perseverate on particular movements, has a difficult time shifting focus Musc/Skel:  Normal GEN NAD   Assessment/Plan: 1. Functional deficits secondary to left Thalamic ICH, Hydrocephalus with Right  shunt which require 3+ hours per day of interdisciplinary therapy in a comprehensive inpatient rehab setting. Physiatrist is providing close team supervision and 24 hour management of active medical problems listed below. Physiatrist and rehab team continue to assess barriers to discharge/monitor patient progress toward functional and medical goals.  FIM: FIM - Bathing Bathing Steps Patient Completed: Right  Arm;Left Arm;Chest;Front perineal area Bathing: 0: Activity did not occur  FIM - Upper Body Dressing/Undressing Upper body dressing/undressing steps patient completed: Thread/unthread right sleeve of pullover shirt/dresss;Thread/unthread left sleeve of pullover shirt/dress;Put head through opening of pull over shirt/dress Upper body dressing/undressing: 4: Min-Patient completed 75 plus % of tasks FIM - Lower Body Dressing/Undressing Lower body dressing/undressing: 1: Two helpers  FIM - Toileting Toileting: 0: No continent bowel/bladder events this shift  FIM - Air cabin crew Transfers: 0-Activity did not occur  FIM - Control and instrumentation engineer Devices: Bed rails;Arm rests Bed/Chair Transfer: 1: Two helpers  FIM - Locomotion: Wheelchair Locomotion: Wheelchair: 1: Total Assistance/staff pushes wheelchair (Pt<25%) FIM - Locomotion: Ambulation Locomotion: Ambulation Assistive Devices: Ethelene Hal Ambulation/Gait Assistance: 1: +2 Total assist Locomotion: Ambulation: 1: Two helpers  Comprehension Comprehension Mode: Auditory Comprehension: 1-Understands basic less than 25% of the time/requires cueing 75% of the time  Expression Expression Mode: Verbal Expression Assistive Devices: 6-Talk trach valve Expression: 1-Expresses basis less than 25% of the time/requires cueing greater than 75% of the time.  Social Interaction Social Interaction Mode: Asleep Social Interaction: 1-Interacts appropriately less than 25% of the time. May be withdrawn or combative.  Problem Solving Problem Solving Mode: Asleep Problem Solving: 1-Solves basic less than 25% of the time - needs direction nearly all the time or does not effectively solve problems and may need a restraint for safety  Memory Memory Mode: Asleep Memory: 1-Recognizes or recalls less than 25% of the time/requires cueing greater than 75% of the time Medical Problem List and Plan:  1. Functional  deficits secondary to Left thalamic bleed with hydrocephalus.  2. DVT Prophylaxis/Anticoagulation: Pharmaceutical: Lovenox-  -TEDs for edema, elevation  -check dopplers today 3. Chronic B-knee/Pain Management: Prn oxycodone.   Voltaren gel for knees.  -poorly-compliant pain patient in my practice 4. Mood: Monitor as mentation improves.  5. Neuropsych: This patient is not capable of making decisions on his own behalf.   -continue low dose ritalin---will not push higher given CV history 6. Skin/Wound Care:   Continue air mattress overlay.  .  7. DM type 2: Will monitor BS with ac/hs checks. Continue Levemir bid and adjust as indicated.  -reasonable control still  8. Enterobacter tracheobronchitis: abx completed  -decannulated   7. HTN/CAD: continue Norvasc daily and metoprolol bid.   -will add HCTZ to assist with edema as well as bp 8. ABLA: on iron supplement.   9.  Low grade fever temp--likely atelectasis----improving  -continue IS, OOB  LOS (Days) 14 A FACE TO FACE EVALUATION WAS PERFORMED  Malyn Aytes T 08/13/2014, 7:29 AM

## 2014-08-13 NOTE — Progress Notes (Signed)
Occupational Therapy Session Note  Patient Details  Name: Tony Davis MRN: [id number] Date of Birth: [birth year]  Today's Date: 08/13/2014 OT Individual Time: 1000-1100 OT Individual Time Calculation (min): 60 min    Short Term Goals: Week 1:  OT Short Term Goal 1 (Week 1): Pt will perform dynamic sitting balance seated on EOB with Max A  in order to increase balance for ADLs. OT Short Term Goal 1 - Progress (Week 1): Met OT Short Term Goal 2 (Week 1): Pt will perform grooming with Max A in order to increase ADL performance. OT Short Term Goal 2 - Progress (Week 1): Met OT Short Term Goal 3 (Week 1): Pt will perform UB dressing with Max A in order to increase ADL performance. OT Short Term Goal 3 - Progress (Week 1): Met OT Short Term Goal 4 (Week 1): Pt will  engage in 5 minutes of functional activity before taking a rest break to increase endurance.  OT Short Term Goal 4 - Progress (Week 1): Progressing toward goal Week 2:  OT Short Term Goal 1 (Week 2): Pt will maintain sitting balance EOB for 5 mins with min A while performing functional tasks OT Short Term Goal 2 (Week 2): Pt will perform UB dressing with mod A while seated OT Short Term Goal 3 (Week 2): Pt will perform LB dressing with max A OT Short Term Goal 4 (Week 2): Pt will engage in 5 mins of functional activity before taking a rest break to increase endurance OT Short Term Goal 5 (Week 2): Pt will sustain attention for 60 seconds with min verbal cues during funcitonal tasks  Skilled Therapeutic Interventions/Progress Updates:    Pt lying in bed asleep upon OT arrival.  Pt opened eyes upon greeting, but kept closing eyes through out session.  Performed rolling to right and left with total assist for bathing and dressing.  Unable to assist pt to EOB with total +1.  Pt verbalized phrases like " They shouldn't treat Mortimer Fries like that and I plan to go home throughout last part of session.  He would not elaborate any further.  Pt.  Unable to give birthdate at beginning of session, but did at end of session.  Pt. initaiting 25 % of dressing and bathing tasks.  Left in bed per RN request.    Therapy Documentation Precautions:  Precautions Precautions: Fall;Other (comment) Precaution Comments: PEG/abdominal precautions, trach, Bilateral thigh high TED hose  Restrictions Weight Bearing Restrictions: No   Pain:  none   See FIM for current functional status  Therapy/Group: Individual Therapy  Lisa Roca 08/13/2014, 12:45 PM

## 2014-08-13 NOTE — Progress Notes (Signed)
Physical Therapy Weekly Progress Note  Patient Details  Name: Tony Davis MRN: [id number] Date of Birth: 1961/02/16  Beginning of progress report period: August 06, 2014 End of progress report period: August 13, 2014  Today's Date: 08/13/2014 PT Individual Time: 1500-1600 PT Individual Time Calculation (min): 60 min   Pt continues to make slow progress in therapy and has recently achieved 3/5 STGs, see details below. Pt currently req min-Ax2 persons for bed mobility, max Ax1person-Ax2persons for t/f sit<>standand t/f bed<>w/c, supervision-mod A for sitting balance, total A for w/c propulsion, Ax2 persons for ambulation up to 45' w/ use of Ethelene Hal and Ax2 persons for stair negotiation. Level of assist variable dependent upon alertness/arousal, activity tolerance, focused attention and initiation. Overall, pt req max multimodal cues for initiation and attention to therapeutic tasks. Pt demonstrating excellent tolerance to therapies on RA.   Patient continues to demonstrate the following deficits: decreased activity tolerance, decreased strength, decreased balance, decreased postural control decreased orientation, decreased initiation, decreased memory, decreased problem solving, decreased awareness, decreased attention, decreased functional transfers and mobility and therefore will continue to benefit from skilled PT intervention to enhance overall performance with activity tolerance, balance, postural control, functional use of right upper extremity, right lower extremity, left upper extremity and left lower extremity, attention, awareness, coordination and overall functional transfers and mobility.  Patient progressing toward long term goals.  Plan of care revisions: Standing balance and ambulatory goal upgraded, memory goal downgraded. .  PT Short Term Goals Week 1:  PT Short Term Goal 1 (Week 1): Pt will transfer out of bed to w/c using maxi sky lift PT Short Term Goal 1 - Progress  (Week 1): Met PT Short Term Goal 2 (Week 1): Pt will tolerate sitting up in tilt in space w/c for 2 hours. PT Short Term Goal 2 - Progress (Week 1): Met PT Short Term Goal 3 (Week 1): Pt will roll in bed req 1 person assist PT Short Term Goal 3 - Progress (Week 1): Progressing toward goal PT Short Term Goal 4 (Week 1): Pt will tolerate supine/side lie to sit transfer req 2 person assist.  PT Short Term Goal 4 - Progress (Week 1): Met PT Short Term Goal 5 (Week 1): Pt will demonstrate sustained focus of an activity for 30 seconds.  PT Short Term Goal 5 - Progress (Week 1): Not met Week 2:  PT Short Term Goal 1 (Week 2): Pt will demonstrate focused attention to therapeutic task for 30 seconds w/ max multimodal cues PT Short Term Goal 1 - Progress (Week 2): Progressing toward goal PT Short Term Goal 2 (Week 2): Pt to perform bed mobility w/ max Ax1person PT Short Term Goal 2 - Progress (Week 2): Progressing toward goal PT Short Term Goal 3 (Week 2): Pt to transfer bed<>w/c with Ax2 persons without use of lift equipment PT Short Term Goal 3 - Progress (Week 2): Met PT Short Term Goal 4 (Week 2): Pt to demonstrate sitting balance with min guard A, 50% of time PT Short Term Goal 4 - Progress (Week 2): Met PT Short Term Goal 5 (Week 2): Pt will ambulate 100' w/ Ax2 persons PT Short Term Goal 5 - Progress (Week 2): Met Week 3:  PT Short Term Goal 1 (Week 3): Pt will demonstrated focused attention to therapeutic task for 30 seconds with max multimodal cues PT Short Term Goal 2 (Week 3): Pt to perform bed mobility with mod Ax1person, 50% of time PT Short Term Goal  3 (Week 3): Pt to perform bed<>w/c transfers with max Ax1 person PT Short Term Goal 4 (Week 3): Pt to demonstrate standing balance with max Ax1person, 50% of time PT Short Term Goal 5 (Week 3): Pt to ambulate 100' with mod Ax1 person  Skilled Therapeutic Interventions/Progress Updates:  Pt noted to have B DVTs, RN stating per PA-C that pt  ok to participate in therapies at this time. Pt received semi-reclined in bed, stating "I have a headache." RN made aware and present to provide pain medicine. Focus this session on activity tolerance, focused>sustained attention, following simple commands, initiation, bed mobility, functional transfers and ambulation. Pt noted to be incontinent, req max Ax2 persons for rolling and total Ax1person for clean up. Pt req max multimodal cues for all initiation this session. Pt req max Ax1person for t/f sup>sit EOB and total A to don shorts in combination of sitting EOB and standing. Mod Ax2 persons for t/f sit<>stand from EOB or w/c this session as well as squat pivot t/f bed<>w/c. Pt with good tolerance to ambulation 100'x2 with Ethelene Hal and Ax2 persons (mod Ax1 for guiding eva walker and min guard Ax1). Therapist facilitating more fluid reciprocal pattern w/ increased pace set by eva walker. Pt reported feeling fatigued at end of session, assisted back to bed w/ all needs in reach and bed alarm on.   Therapy Documentation Precautions:  Precautions Precautions: Fall;Other (comment) Precaution Comments: PEG/abdominal precautions, trach, Bilateral thigh high TED hose  Restrictions Weight Bearing Restrictions: No Vital Signs: Therapy Vitals Temp: 99 F (37.2 C) Temp src: Oral Pulse Rate: 106 Resp: 17 BP: 132/88 mmHg Patient Position (if appropriate): Sitting Oxygen Therapy SpO2: 95 % O2 Device: None (Room air)  See FIM for current functional status  Therapy/Group: Individual Therapy  Gilmore Laroche 08/13/2014, 4:09 PM

## 2014-08-13 NOTE — Progress Notes (Signed)
Occupational Therapy Session Note  Patient Details  Name: Tony Davis MRN: [id number] Date of Birth: 04/14/1961  Today's Date: 08/13/2014 OT Individual Time: 1335-1405 OT Individual Time Calculation (min): 30 min    Short Term Goals: Week 2:  OT Short Term Goal 1 (Week 2): Pt will maintain sitting balance EOB for 5 mins with min A while performing functional tasks OT Short Term Goal 2 (Week 2): Pt will perform UB dressing with mod A while seated OT Short Term Goal 3 (Week 2): Pt will perform LB dressing with max A OT Short Term Goal 4 (Week 2): Pt will engage in 5 mins of functional activity before taking a rest break to increase endurance OT Short Term Goal 5 (Week 2): Pt will sustain attention for 60 seconds with min verbal cues during funcitonal tasks  Skilled Therapeutic Interventions/Progress Updates:  Patient sleeping in bed upon arrival.  Engaged in grooming tasks, orientation, following 1 step commands, initiation, sustained attention.  Approximately 50% of the time, patient able to follow 1 step commands without need to repeat instruction.  Patient is very slow to respond most of the time and slow processing information.  When presented with washcloth to wash face, patient used RUE to complete task and just did a dab here and there.  Patient was told by this OT that his ears needed to be cleaned and without delay used RUE to clean out his right ear with no attempt to wash left ear.  Hand over hand with LUE attempted without success to wash face and wash ear.  When presented with hair brush, patient used RUE to brush a spot about 2" X 2" on right side of head near temple.  Patient continued to perseverate and would not attempt other parts of his head after several requests.  Patient indicated that he did not know why he was in the hospital therefore patient was oriented to situation, time and place.  Asked patient on 2 occasions if he wanted me to call him [name], [name] or  something else and he never would answer despite significant amount of time allowed.  At the end of the session, this clinician reminded patient that he had not answered the question and without delay he said, "call me whichever you like".  Therapy Documentation Precautions:  Precautions Precautions: Fall;Other (comment) Precaution Comments: PEG/abdominal precautions, Bilateral thigh high TED hose  Restrictions Weight Bearing Restrictions: No Pain: No indication or report of pain  Therapy/Group: Individual Therapy  [name] 08/13/2014, 3:50 PM

## 2014-08-14 ENCOUNTER — Inpatient Hospital Stay (HOSPITAL_COMMUNITY): Payer: Medicare Other | Admitting: Physical Therapy

## 2014-08-14 ENCOUNTER — Inpatient Hospital Stay (HOSPITAL_COMMUNITY): Payer: Medicare Other | Admitting: Occupational Therapy

## 2014-08-14 LAB — GLUCOSE, CAPILLARY
GLUCOSE-CAPILLARY: 107 mg/dL — AB (ref 70–99)
Glucose-Capillary: 103 mg/dL — ABNORMAL HIGH (ref 70–99)
Glucose-Capillary: 98 mg/dL (ref 70–99)
Glucose-Capillary: 164 mg/dL — ABNORMAL HIGH (ref 70–99)

## 2014-08-14 NOTE — Progress Notes (Signed)
Occupational Therapy Session Note  Patient Details  Name: Tony Davis MRN: [id number] Date of Birth: 1961/11/19  Today's Date: 08/14/2014 OT Individual Time: 7793-9030 OT Individual Time Calculation (min): 32 min    Short Term Goals: Week 1:  OT Short Term Goal 1 (Week 1): Pt will perform dynamic sitting balance seated on EOB with Max A  in order to increase balance for ADLs. OT Short Term Goal 1 - Progress (Week 1): Met OT Short Term Goal 2 (Week 1): Pt will perform grooming with Max A in order to increase ADL performance. OT Short Term Goal 2 - Progress (Week 1): Met OT Short Term Goal 3 (Week 1): Pt will perform UB dressing with Max A in order to increase ADL performance. OT Short Term Goal 3 - Progress (Week 1): Met OT Short Term Goal 4 (Week 1): Pt will  engage in 5 minutes of functional activity before taking a rest break to increase endurance.  OT Short Term Goal 4 - Progress (Week 1): Progressing toward goal Week 2:  OT Short Term Goal 1 (Week 2): Pt will maintain sitting balance EOB for 5 mins with min A while performing functional tasks OT Short Term Goal 2 (Week 2): Pt will perform UB dressing with mod A while seated OT Short Term Goal 3 (Week 2): Pt will perform LB dressing with max A OT Short Term Goal 4 (Week 2): Pt will engage in 5 mins of functional activity before taking a rest break to increase endurance OT Short Term Goal 5 (Week 2): Pt will sustain attention for 60 seconds with min verbal cues during funcitonal tasks  Skilled Therapeutic Interventions/Progress Updates:    Pt seen this session to address attention, initiation, visual focus, and functional mobility. Pt was in bed having just completed his PT session.  Two NT present to A him with bathing. Therapy worked on A/AROM of arms and legs to prepare for rolling in bed and active trunk movement to assist with rolling as NT changed his brief and bathed him.  NT assisted this clinician with donning pants. Attempted  to have pt bridge, he was able to lift hips slightly but not enough to slide pants under hips.  Overall mod -max A with rolling but pt did assist with initiating using his arms to roll. Needed max cues and mod A to to lift head and initiate rolling with head. Pt donned shirt with max a.  Good attention and initiation with brushing teeth to prepare for water protocol. Pt only took 4 sips of water.  Pt able to state what hospital he was in and why. Cues to make eye contact. Response time with basic questions only minimally delayed.  Pt left in  Bed with bed alarm and bell call light in reach.    Therapy Documentation Precautions:  Precautions Precautions: Fall;Other (comment) Precaution Comments: PEG/abdominal precautions, trach, Bilateral thigh high TED hose  Restrictions Weight Bearing Restrictions: No    Vital Signs: Therapy Vitals Temp: 98.3 F (36.8 C) Temp src: Oral Pulse Rate: 92 Resp: 18 BP: 138/91 mmHg Patient Position (if appropriate): Lying Oxygen Therapy SpO2: 95 % O2 Device: None (Room air) Pain: No c/o pain during session. ADL:  See FIM for current functional status  Therapy/Group: Individual Therapy  [place] 08/14/2014, 1:25 PM

## 2014-08-14 NOTE — Progress Notes (Signed)
Physical Therapy Session Note  Patient Details  Name: Tony Davis MRN: [id number] Date of Birth: 09-May-1961  Today's Date: 08/14/2014 PT Individual Time: 1030-1100 PT Individual Time Calculation (min): 30 min   Short Term Goals: Week 3:  PT Short Term Goal 1 (Week 3): Pt will demonstrated focused attention to therapeutic task for 30 seconds with max multimodal cues PT Short Term Goal 2 (Week 3): Pt to perform bed mobility with mod Ax1person, 50% of time PT Short Term Goal 3 (Week 3): Pt to perform bed<>w/c transfers with max Ax1 person PT Short Term Goal 4 (Week 3): Pt to demonstrate standing balance with max Ax1person, 50% of time PT Short Term Goal 5 (Week 3): Pt to ambulate 100' with mod Ax1 person  Skilled Therapeutic Interventions/Progress Updates:    Therapeutic Activity: PT initiated rolling in bed to R and to L req mod-max A with bedrail in order to don shorts req tot A. Upon donning shorts, PT notes pt is wet, and so shorts doffed with tot A. Pt has difficult time following simple one step commands and demonstrates motor apraxia.  CNA came into room and PT assisted rolling pt to change his brief. CNA requested pt stay in bed b/c he needed a bath. PT instructs pt in rolling and additional 6 times req mod A to change brief and replace the draw sheet with a dry one.   Pt is more alert during today's sesssion than when this therapist last saw him upon evaluation about 2 weeks ago. Pt continues to be very impaired, but with repeated rolling, showed improved volitional movement of bending his knees up and reaching for the bedrail to assist. Pt will benefit from continued PT to decrease burden of care and maximize functional independence.    Therapy Documentation Precautions:  Precautions Precautions: Fall;Other (comment) Precaution Comments: PEG/abdominal precautions, trach, Bilateral thigh high TED hose  Restrictions Weight Bearing Restrictions: No Pain: Pain Assessment Pain  Assessment: Faces Faces Pain Scale: Hurts a little bit Pain Type: Chronic pain Pain Location: Leg Pain Orientation: Left;Right Pain Onset: On-going Pain Intervention(s): Rest Multiple Pain Sites: No  See FIM for current functional status  Therapy/Group: Individual Therapy  Porschea Borys M 08/14/2014, 10:43 AM

## 2014-08-14 NOTE — Progress Notes (Signed)
Subjective/Complaints:   No problems reported by RN. Had a reasonable night. Resting soundly upon my entering. Review of Systems -remains limited due to cognitive status   Objective: Vital Signs: Blood pressure 145/93, pulse 92, temperature 97.5 F (36.4 C), temperature source Axillary, resp. rate 18, weight 112 kg (246 lb 14.6 oz), SpO2 98.00%. Dg Swallowing Func-speech Pathology  08/13/2014   Selinda Orion Page, CCC-SLP     08/13/2014  4:40 PM   Objective Swallowing Evaluation: Modified Barium Swallowing Study   Patient Details  Name: GERGORY BIELLO MRN: [id number] Date of Birth: 16-Mar-1961  Today's Date: 08/13/2014 Time: 0905 -54     Past Medical History:  Past Medical History  Diagnosis Date  . GERD (gastroesophageal reflux disease)   . HLD (hyperlipidemia)   . HTN (hypertension)   . Hematuria 8/12  . PFO (patent foramen ovale)     echo 8/12: EF 55-65%, grade 2 diast dysfnx; +PFO on bubble  study;  TEE 4/13: EF normal, atrial septum with suspicion for  interatrial septum fenestrations without flow across and few  large bubbles noted in LA.  This was not felt to require closure    . CAD (coronary artery disease)     a. s/p promus DES circumflex artery 10/13/10. b. Cath 04/2013:  stable disease but possible small vessel disease -Imdur added.  Marland Kitchen DVT (deep venous thrombosis) 2013    "right"   . TIA (transient ischemic attack)     "1-2; both after the one in 02/2010" (09/25/2013)  . OSA on CPAP   . Type II diabetes mellitus     June 2014 AIC 6.2%/notes 09/24/2013; pt denies this hx on  09/25/2013  . Arthritis     "knees" (09/25/2013)  . Rheumatoid arthritis(714.0)   . CVA (cerebral vascular accident) 02/2010    "lost peripheral vision when I had the stroke; some memory  issues since" (09/25/2013)   Past Surgical History:  Past Surgical History  Procedure Laterality Date  . Knee arthroscopy Bilateral 2000's    2 on left and 3 on rt  . Tee without cardioversion  03/21/2012    Procedure: TRANSESOPHAGEAL ECHOCARDIOGRAM  (TEE);  Surgeon:  Fay Records, MD;  Location: [facility] ENDOSCOPY;  Service:  Cardiovascular;  Laterality: N/A;  . Coronary angioplasty with stent placement  2012    "1" (09/24/2013)   HPI:  Mr. Brayam Boeke is a 53 year old male with h/o, CVA '11, PFO-on  chronic coumadin who was admitted to OSH on 06/13/14 with left  basal ganglia hemorrhage with intraventricular extension and  hydrocephalus requiring right crani for evacuation as well as VP  shunt placement. Hospital course complicated by acute respiratory  failure requiring tracheostomy as well as PEG placement. Patient  with subsequent right hemiparesis, lethargy, nonverbal, requiring  noxious stimuli for arousal, difficulty tracking, as well as  difficulty following commands. He was transferred to [facility] on  07/08/14 for vent wean and rehab. Initiated on Dys 2 diet with  thin liquids per MBS at [facility]; however downgraded to NPO on CIR due  to decreased alertness and inconsistent s/s of aspiration noted  at bedside.  Lurline Idol downsized to CFS # 6 on 08/15 and able to  tolerate PMSV. Decannulated 8/31.  Repeat MBS ordered today to  objectively determine readiness for diet initiation.       Recommendation/Prognosis  Clinical Impression:   Dysphagia Diagnosis: Mild pharyngeal phase dysphagia;Mild oral  phase dysphagia Pt presents with a sensorimotor based dysphagia which is  exacerbated by pt's significant cognitive deficits.  Pt  demonstrates with generalized lingual weakness resulting in  prolonged oral transit and poor control for transiting boluses to  the oropharynx, which in combination with decreased sensation  resulted in premature spillage of all materials into the pharynx  with pharyngeal swallow triggered at the pyriform sinuses for  thin liquids and at the vallecula for purees and solid cracker  consistencies.  Delayed swallow initiation resulted in one  instance of silent penetration with small cup sips of thin  liquids which cleared with cuing for throat clear; no  other  aspiration or penetration visualized on study with trials of  large, consecutive cup sips of thin liquids, purees, or solid  consistencies.  Recommend re-initiating diet of dys 2 solids and  thin liquids with full supervision for safety due to cognitive  impairments with primary SLP at next available appointment.     Swallow Evaluation Recommendations:  Diet Recommendations: Dysphagia 2 (Fine chop);Thin liquid (to be  initiated with primary SLP at next available appointment ) Liquid Administration via: Cup Medication Administration: Whole meds with puree Supervision: Full supervision/cueing for compensatory strategies Compensations: Slow rate;Small sips/bites;Check for pocketing Postural Changes and/or Swallow Maneuvers: Seated upright 90  degrees Oral Care Recommendations: Oral care BID    Prognosis:  Prognosis for Safe Diet Advancement: Good   Individuals Consulted: Consulted and Agree with Results and  Recommendations: Patient unable/family or caregiver not available      SLP Assessment/Plan  Plan:   See care plan    Short Term Goals: Week 1: SLP Short Term Goal 1 (Week 1): Patient  will initiate verbal response to yes/no and open-ended questions  within 5-10 seconds with moderate verbal/tactile cues. SLP Short Term Goal 1 - Progress (Week 1): Progressing toward  goal SLP Short Term Goal 2 (Week 1): Patient will tolerate current  diet with no overt s/s aspiration. SLP Short Term Goal 2 - Progress (Week 1): Revised due to lack of  progress SLP Short Term Goal 3 (Week 1): Patient will tolerate PMV  placement for all waking hours with no significant changes in  SpO2 saturation. SLP Short Term Goal 3 - Progress (Week 1): Met SLP Short Term Goal 4 (Week 1): Patient will follow basic level,  1-step directions with 75% accuracy. SLP Short Term Goal 4 - Progress (Week 1): Progressing toward  goal    General: Date of Onset: 06/13/14 Type of Study: Modified Barium Swallowing Study Reason for Referral: Objectively  evaluate swallowing function Previous Swallow Assessment: BSE when admitted to CIR Diet Prior to this Study: NPO Temperature Spikes Noted: No Respiratory Status: Room air Behavior/Cognition: Confused;Requires cueing;Decreased sustained  attention;Alert Self-Feeding Abilities: Needs assist Patient Positioning: Upright in chair Baseline Vocal Quality: Low vocal intensity Volitional Cough: Weak     Reason for Referral:   Objectively evaluate swallowing function    Oral Phase: Oral Preparation/Oral Phase Oral Phase: Impaired Oral - Thin Oral - Thin Cup: Lingual pumping;Weak lingual  manipulation;Reduced posterior propulsion;Delayed oral transit Oral - Solids Oral - Puree: Lingual pumping;Delayed oral transit;Weak lingual  manipulation;Reduced posterior propulsion;Lingual/palatal residue Oral - Regular: Impaired mastication;Lingual  pumping;Lingual/palatal residue;Weak lingual manipulation;Reduced  posterior propulsion Oral - Pill: Reduced posterior propulsion;Delayed oral transit   Pharyngeal Phase:  Pharyngeal Phase Pharyngeal Phase: Impaired Pharyngeal - Thin Pharyngeal - Thin Cup: Delayed swallow initiation;Premature  spillage to pyriform sinuses;Penetration/Aspiration during  swallow;Reduced tongue base retraction Penetration/Aspiration details (thin cup): Material enters  airway, remains ABOVE vocal cords and not ejected  out Pharyngeal - Solids Pharyngeal - Puree: Delayed swallow initiation;Pharyngeal residue  - valleculae;Reduced tongue base retraction;Premature spillage to  valleculae Pharyngeal - Regular: Delayed swallow initiation;Premature  spillage to valleculae;Reduced tongue base retraction;Pharyngeal  residue - valleculae Pharyngeal - Pill: Delayed swallow initiation;Premature spillage  to valleculae;Reduced tongue base retraction      GN        Windell Moulding, M.A. CCC-SLP  Page, Selinda Orion 08/13/2014, 10:01 AM                    Results for orders placed during the hospital encounter of 07/30/14 (from the  past 72 hour(s))  GLUCOSE, CAPILLARY     Status: None   Collection Time    08/11/14  1:41 PM      Result Value Ref Range   Glucose-Capillary 80  70 - 99 mg/dL  GLUCOSE, CAPILLARY     Status: None   Collection Time    08/11/14  7:42 PM      Result Value Ref Range   Glucose-Capillary 87  70 - 99 mg/dL  GLUCOSE, CAPILLARY     Status: Abnormal   Collection Time    08/11/14 11:53 PM      Result Value Ref Range   Glucose-Capillary 154 (*) 70 - 99 mg/dL   Comment 1 Notify RN    GLUCOSE, CAPILLARY     Status: Abnormal   Collection Time    08/12/14  7:11 AM      Result Value Ref Range   Glucose-Capillary 101 (*) 70 - 99 mg/dL  URINALYSIS, ROUTINE W REFLEX MICROSCOPIC     Status: Abnormal   Collection Time    08/12/14  9:33 AM      Result Value Ref Range   Color, Urine YELLOW  YELLOW   APPearance HAZY (*) CLEAR   Specific Gravity, Urine 1.007  1.005 - 1.030   pH 8.0  5.0 - 8.0   Glucose, UA NEGATIVE  NEGATIVE mg/dL   Hgb urine dipstick NEGATIVE  NEGATIVE   Bilirubin Urine NEGATIVE  NEGATIVE   Ketones, ur NEGATIVE  NEGATIVE mg/dL   Protein, ur NEGATIVE  NEGATIVE mg/dL   Urobilinogen, UA 0.2  0.0 - 1.0 mg/dL   Nitrite NEGATIVE  NEGATIVE   Leukocytes, UA NEGATIVE  NEGATIVE   Comment: MICROSCOPIC NOT DONE ON URINES WITH NEGATIVE PROTEIN, BLOOD, LEUKOCYTES, NITRITE, OR GLUCOSE <1000 mg/dL.  URINE CULTURE     Status: None   Collection Time    08/12/14  9:33 AM      Result Value Ref Range   Specimen Description URINE, CLEAN CATCH     Special Requests NONE     Culture  Setup Time       Value: 08/12/2014 14:53     Performed at [facility]       Value: >=100,000 COLONIES/ML     Performed at [facility]   Culture       Value: Multiple bacterial morphotypes present, none predominant. Suggest appropriate recollection if clinically indicated.     Performed at [facility]   Report Status 08/13/2014 FINAL    GLUCOSE, CAPILLARY     Status: None    Collection Time    08/12/14  1:22 PM      Result Value Ref Range   Glucose-Capillary 85  70 - 99 mg/dL  GLUCOSE, CAPILLARY     Status: None   Collection Time    08/12/14  8:50  PM      Result Value Ref Range   Glucose-Capillary 96  70 - 99 mg/dL  GLUCOSE, CAPILLARY     Status: None   Collection Time    08/13/14  7:31 AM      Result Value Ref Range   Glucose-Capillary 86  70 - 99 mg/dL   Comment 1 Notify RN    GLUCOSE, CAPILLARY     Status: None   Collection Time    08/13/14  2:10 PM      Result Value Ref Range   Glucose-Capillary 88  70 - 99 mg/dL  GLUCOSE, CAPILLARY     Status: None   Collection Time    08/13/14  8:22 PM      Result Value Ref Range   Glucose-Capillary 93  70 - 99 mg/dL  GLUCOSE, CAPILLARY     Status: Abnormal   Collection Time    08/14/14  8:12 AM      Result Value Ref Range   Glucose-Capillary 103 (*) 70 - 99 mg/dL     HEENT: neck clean Cardio: RRR and no murmur Resp: CTA B/L and unlabored, clear.  GI: BS positive and PEG site clean. Extremity:  Bilateral pedal edema 1+ Skin:   Intact other than above Neuro:   Initiates more. Inconsistent attention and eye contact however.  Abnormal Sensory reduced on left difficult to assess secondary to MS, Abnormal Motor 4- LUE, 3- RLE, 2- LLE, 5/5 RUE grossly Moves RLE spontaneously.  Musc/Skel:  Normal GEN NAD   Assessment/Plan: 1. Functional deficits secondary to left Thalamic ICH, Hydrocephalus with Right  shunt which require 3+ hours per day of interdisciplinary therapy in a comprehensive inpatient rehab setting. Physiatrist is providing close team supervision and 24 hour management of active medical problems listed below. Physiatrist and rehab team continue to assess barriers to discharge/monitor patient progress toward functional and medical goals.  FIM: FIM - Bathing Bathing Steps Patient Completed: Abdomen;Front perineal area Bathing: 1: Total-Patient completes 0-2 of 10 parts or less than 25%  FIM -  Upper Body Dressing/Undressing Upper body dressing/undressing steps patient completed: Put head through opening of pull over shirt/dress Upper body dressing/undressing: 2: Max-Patient completed 25-49% of tasks FIM - Lower Body Dressing/Undressing Lower body dressing/undressing: 1: Two helpers  FIM - Toileting Toileting: 0: No continent bowel/bladder events this shift  FIM - Air cabin crew Transfers: 0-Activity did not occur  FIM - Control and instrumentation engineer Devices: Bed rails;Arm rests;HOB elevated Bed/Chair Transfer: 2: Supine > Sit: Max A (lifting assist/Pt. 25-49%);1: Two helpers  FIM - Locomotion: Glass blower/designer: Wheelchair: 1: Total Assistance/staff pushes wheelchair (Pt<25%) FIM - Locomotion: Ambulation Locomotion: Ambulation Assistive Devices: Nutritional therapist Ambulation/Gait Assistance: 1: +2 Total assist Locomotion: Ambulation: 1: Two helpers  Comprehension Comprehension Mode: Auditory Comprehension: 2-Understands basic 25 - 49% of the time/requires cueing 51 - 75% of the time  Expression Expression Mode: Verbal Expression Assistive Devices: 6-Talk trach valve Expression: 1-Expresses basis less than 25% of the time/requires cueing greater than 75% of the time.  Social Interaction Social Interaction Mode: Asleep Social Interaction: 1-Interacts appropriately less than 25% of the time. May be withdrawn or combative.  Problem Solving Problem Solving Mode: Asleep Problem Solving: 1-Solves basic less than 25% of the time - needs direction nearly all the time or does not effectively solve problems and may need a restraint for safety  Memory Memory Mode: Asleep Memory: 1-Recognizes or recalls less than 25% of the time/requires cueing greater than 75%  of the time Medical Problem List and Plan:  1. Functional deficits secondary to Left thalamic bleed with hydrocephalus.  2. DVT Prophylaxis/Anticoagulation/has IVCF: Pharmaceutical:  Lovenox-  -TEDs for edema, elevation  -dopplers + (has hx)  3. Chronic B-knee/Pain Management: Prn oxycodone.   Voltaren gel for knees.  -poorly-compliant pain patient in my practice 4. Mood: Monitor as mentation improves.  5. Neuropsych: This patient is not capable of making decisions on his own behalf.   -continue low dose ritalin---will not push higher given CV history 6. Skin/Wound Care:   Continue air mattress overlay.  .  7. DM type 2: Will monitor BS with ac/hs checks. Continue Levemir bid and adjust as indicated.  -reasonable control still  8. Enterobacter tracheobronchitis: abx completed  -decannulated   7. HTN/CAD: continue Norvasc daily and metoprolol bid.   -added HCTZ to assist with edema as well as bp 8. ABLA: on iron supplement.   9.  Low grade fever temp--likely atelectasis----improving  -continue IS, OOB  LOS (Days) 15 A FACE TO FACE EVALUATION WAS PERFORMED  SWARTZ,ZACHARY T 08/14/2014, 8:38 AM

## 2014-08-15 ENCOUNTER — Inpatient Hospital Stay (HOSPITAL_COMMUNITY): Payer: Medicare Other

## 2014-08-15 DIAGNOSIS — I10 Essential (primary) hypertension: Secondary | ICD-10-CM

## 2014-08-15 DIAGNOSIS — IMO0002 Reserved for concepts with insufficient information to code with codable children: Secondary | ICD-10-CM

## 2014-08-15 DIAGNOSIS — I619 Nontraumatic intracerebral hemorrhage, unspecified: Secondary | ICD-10-CM

## 2014-08-15 DIAGNOSIS — Z5189 Encounter for other specified aftercare: Secondary | ICD-10-CM

## 2014-08-15 DIAGNOSIS — M1710 Unilateral primary osteoarthritis, unspecified knee: Secondary | ICD-10-CM

## 2014-08-15 DIAGNOSIS — J9600 Acute respiratory failure, unspecified whether with hypoxia or hypercapnia: Secondary | ICD-10-CM

## 2014-08-15 LAB — GLUCOSE, CAPILLARY
Glucose-Capillary: 94 mg/dL (ref 70–99)
Glucose-Capillary: 157 mg/dL — ABNORMAL HIGH (ref 70–99)
Glucose-Capillary: 140 mg/dL — ABNORMAL HIGH (ref 70–99)
Glucose-Capillary: 102 mg/dL — ABNORMAL HIGH (ref 70–99)

## 2014-08-15 NOTE — Progress Notes (Signed)
Physical Therapy Session Note  Patient Details  Name: Tony Davis MRN: [id number] Date of Birth: Apr 22, 1961  Today's Date: 08/15/2014 PT Individual Time: 1000-1030 PT Individual Time Calculation (min): 30 min   Short Term Goals: Week 3:  PT Short Term Goal 1 (Week 3): Pt will demonstrated focused attention to therapeutic task for 30 seconds with max multimodal cues PT Short Term Goal 2 (Week 3): Pt to perform bed mobility with mod Ax1person, 50% of time PT Short Term Goal 3 (Week 3): Pt to perform bed<>w/c transfers with max Ax1 person PT Short Term Goal 4 (Week 3): Pt to demonstrate standing balance with max Ax1person, 50% of time PT Short Term Goal 5 (Week 3): Pt to ambulate 100' with mod Ax1 person  Skilled Therapeutic Interventions/Progress Updates:    Pt received supine in bed asleep, easily aroused and agreeable to participate in therapy. Pt rolled L/R to assist w/ placing MaxiMove sling. MaxA for rolling L and R, however when rolling L pt initiated w/ bent knee and reaches across body to assist w/ roll. Pt transferred bed>w/c w/ use of MaxiMove lift. Pt transported around unit in w/c by therapist to increase arousal, attention to R side (pt initiated greeting staff members in R visual field w/ min cueing). Transported pt to day room and placed windows on R side to increase attention. During orientation questions pt had decreased phonation and seemed to be mouthing words instead of vocalizing despite max cueing from therapist to increase volume. Pt unable to name state, city, or building that he was in, despite therapist questioning him immediately after informing. Pt transported back to room and left tilted back in TIS chair w/ all needs within reach.  Therapy Documentation Precautions:  Precautions Precautions: Fall;Other (comment) Precaution Comments: PEG/abdominal precautions, trach, Bilateral thigh high TED hose  Restrictions Weight Bearing Restrictions: No General:   Vital  Signs:   Pain: Pain Assessment Pain Assessment: Faces Faces Pain Scale: Hurts a little bit Pain Location: Leg Mobility:   Locomotion :    Trunk/Postural Assessment :    Balance:   Exercises:   Other Treatments:    See FIM for current functional status  Therapy/Group: Individual Therapy  Rada Hay  Rada Hay, PT, DPT 08/15/2014, 12:38 PM

## 2014-08-15 NOTE — Progress Notes (Signed)
Subjective/Complaints:   No new issues. Comfortable. Denies pain other than knees (chronic) Review of Systems -remains limited due to cognitive status   Objective: Vital Signs: Blood pressure 133/86, pulse 96, temperature 98.6 F (37 C), temperature source Oral, resp. rate 18, weight 112 kg (246 lb 14.6 oz), SpO2 94.00%. Dg Swallowing Func-speech Pathology  08/13/2014   Selinda Orion Page, CCC-SLP     08/13/2014  4:40 PM   Objective Swallowing Evaluation: Modified Barium Swallowing Study   Patient Details  Name: Tony Davis MRN: [id number] Date of Birth: 1961/04/19  Today's Date: 08/13/2014 Time: 0905 -19     Past Medical History:  Past Medical History  Diagnosis Date  . GERD (gastroesophageal reflux disease)   . HLD (hyperlipidemia)   . HTN (hypertension)   . Hematuria 8/12  . PFO (patent foramen ovale)     echo 8/12: EF 55-65%, grade 2 diast dysfnx; +PFO on bubble  study;  TEE 4/13: EF normal, atrial septum with suspicion for  interatrial septum fenestrations without flow across and few  large bubbles noted in LA.  This was not felt to require closure    . CAD (coronary artery disease)     a. s/p promus DES circumflex artery 10/13/10. b. Cath 04/2013:  stable disease but possible small vessel disease -Imdur added.  Marland Kitchen DVT (deep venous thrombosis) 2013    "right"   . TIA (transient ischemic attack)     "1-2; both after the one in 02/2010" (09/25/2013)  . OSA on CPAP   . Type II diabetes mellitus     June 2014 AIC 6.2%/notes 09/24/2013; pt denies this hx on  09/25/2013  . Arthritis     "knees" (09/25/2013)  . Rheumatoid arthritis(714.0)   . CVA (cerebral vascular accident) 02/2010    "lost peripheral vision when I had the stroke; some memory  issues since" (09/25/2013)   Past Surgical History:  Past Surgical History  Procedure Laterality Date  . Knee arthroscopy Bilateral 2000's    2 on left and 3 on rt  . Tee without cardioversion  03/21/2012    Procedure: TRANSESOPHAGEAL ECHOCARDIOGRAM (TEE);  Surgeon:  Fay Records, MD;  Location: [facility] ENDOSCOPY;  Service:  Cardiovascular;  Laterality: N/A;  . Coronary angioplasty with stent placement  2012    "1" (09/24/2013)   HPI:  Tony Davis is a 53 year old male with h/o, CVA '11, PFO-on  chronic coumadin who was admitted to OSH on 06/13/14 with left  basal ganglia hemorrhage with intraventricular extension and  hydrocephalus requiring right crani for evacuation as well as VP  shunt placement. Hospital course complicated by acute respiratory  failure requiring tracheostomy as well as PEG placement. Patient  with subsequent right hemiparesis, lethargy, nonverbal, requiring  noxious stimuli for arousal, difficulty tracking, as well as  difficulty following commands. He was transferred to [facility] on  07/08/14 for vent wean and rehab. Initiated on Dys 2 diet with  thin liquids per MBS at [facility]; however downgraded to NPO on CIR due  to decreased alertness and inconsistent s/s of aspiration noted  at bedside.  Lurline Idol downsized to CFS # 6 on 08/15 and able to  tolerate PMSV. Decannulated 8/31.  Repeat MBS ordered today to  objectively determine readiness for diet initiation.       Recommendation/Prognosis  Clinical Impression:   Dysphagia Diagnosis: Mild pharyngeal phase dysphagia;Mild oral  phase dysphagia Pt presents with a sensorimotor based dysphagia which is  exacerbated by pt's significant  cognitive deficits.  Pt  demonstrates with generalized lingual weakness resulting in  prolonged oral transit and poor control for transiting boluses to  the oropharynx, which in combination with decreased sensation  resulted in premature spillage of all materials into the pharynx  with pharyngeal swallow triggered at the pyriform sinuses for  thin liquids and at the vallecula for purees and solid cracker  consistencies.  Delayed swallow initiation resulted in one  instance of silent penetration with small cup sips of thin  liquids which cleared with cuing for throat clear; no other  aspiration or  penetration visualized on study with trials of  large, consecutive cup sips of thin liquids, purees, or solid  consistencies.  Recommend re-initiating diet of dys 2 solids and  thin liquids with full supervision for safety due to cognitive  impairments with primary SLP at next available appointment.     Swallow Evaluation Recommendations:  Diet Recommendations: Dysphagia 2 (Fine chop);Thin liquid (to be  initiated with primary SLP at next available appointment ) Liquid Administration via: Cup Medication Administration: Whole meds with puree Supervision: Full supervision/cueing for compensatory strategies Compensations: Slow rate;Small sips/bites;Check for pocketing Postural Changes and/or Swallow Maneuvers: Seated upright 90  degrees Oral Care Recommendations: Oral care BID    Prognosis:  Prognosis for Safe Diet Advancement: Good   Individuals Consulted: Consulted and Agree with Results and  Recommendations: Patient unable/family or caregiver not available      SLP Assessment/Plan  Plan:   See care plan    Short Term Goals: Week 1: SLP Short Term Goal 1 (Week 1): Patient  will initiate verbal response to yes/no and open-ended questions  within 5-10 seconds with moderate verbal/tactile cues. SLP Short Term Goal 1 - Progress (Week 1): Progressing toward  goal SLP Short Term Goal 2 (Week 1): Patient will tolerate current  diet with no overt s/s aspiration. SLP Short Term Goal 2 - Progress (Week 1): Revised due to lack of  progress SLP Short Term Goal 3 (Week 1): Patient will tolerate PMV  placement for all waking hours with no significant changes in  SpO2 saturation. SLP Short Term Goal 3 - Progress (Week 1): Met SLP Short Term Goal 4 (Week 1): Patient will follow basic level,  1-step directions with 75% accuracy. SLP Short Term Goal 4 - Progress (Week 1): Progressing toward  goal    General: Date of Onset: 06/13/14 Type of Study: Modified Barium Swallowing Study Reason for Referral: Objectively evaluate swallowing  function Previous Swallow Assessment: BSE when admitted to CIR Diet Prior to this Study: NPO Temperature Spikes Noted: No Respiratory Status: Room air Behavior/Cognition: Confused;Requires cueing;Decreased sustained  attention;Alert Self-Feeding Abilities: Needs assist Patient Positioning: Upright in chair Baseline Vocal Quality: Low vocal intensity Volitional Cough: Weak     Reason for Referral:   Objectively evaluate swallowing function    Oral Phase: Oral Preparation/Oral Phase Oral Phase: Impaired Oral - Thin Oral - Thin Cup: Lingual pumping;Weak lingual  manipulation;Reduced posterior propulsion;Delayed oral transit Oral - Solids Oral - Puree: Lingual pumping;Delayed oral transit;Weak lingual  manipulation;Reduced posterior propulsion;Lingual/palatal residue Oral - Regular: Impaired mastication;Lingual  pumping;Lingual/palatal residue;Weak lingual manipulation;Reduced  posterior propulsion Oral - Pill: Reduced posterior propulsion;Delayed oral transit   Pharyngeal Phase:  Pharyngeal Phase Pharyngeal Phase: Impaired Pharyngeal - Thin Pharyngeal - Thin Cup: Delayed swallow initiation;Premature  spillage to pyriform sinuses;Penetration/Aspiration during  swallow;Reduced tongue base retraction Penetration/Aspiration details (thin cup): Material enters  airway, remains ABOVE vocal cords and not ejected out Pharyngeal - Solids  Pharyngeal - Puree: Delayed swallow initiation;Pharyngeal residue  - valleculae;Reduced tongue base retraction;Premature spillage to  valleculae Pharyngeal - Regular: Delayed swallow initiation;Premature  spillage to valleculae;Reduced tongue base retraction;Pharyngeal  residue - valleculae Pharyngeal - Pill: Delayed swallow initiation;Premature spillage  to valleculae;Reduced tongue base retraction      GN        Windell Moulding, M.A. CCC-SLP  Page, Selinda Orion 08/13/2014, 10:01 AM                    Results for orders placed during the hospital encounter of 07/30/14 (from the past 72 hour(s))   URINALYSIS, ROUTINE W REFLEX MICROSCOPIC     Status: Abnormal   Collection Time    08/12/14  9:33 AM      Result Value Ref Range   Color, Urine YELLOW  YELLOW   APPearance HAZY (*) CLEAR   Specific Gravity, Urine 1.007  1.005 - 1.030   pH 8.0  5.0 - 8.0   Glucose, UA NEGATIVE  NEGATIVE mg/dL   Hgb urine dipstick NEGATIVE  NEGATIVE   Bilirubin Urine NEGATIVE  NEGATIVE   Ketones, ur NEGATIVE  NEGATIVE mg/dL   Protein, ur NEGATIVE  NEGATIVE mg/dL   Urobilinogen, UA 0.2  0.0 - 1.0 mg/dL   Nitrite NEGATIVE  NEGATIVE   Leukocytes, UA NEGATIVE  NEGATIVE   Comment: MICROSCOPIC NOT DONE ON URINES WITH NEGATIVE PROTEIN, BLOOD, LEUKOCYTES, NITRITE, OR GLUCOSE <1000 mg/dL.  URINE CULTURE     Status: None   Collection Time    08/12/14  9:33 AM      Result Value Ref Range   Specimen Description URINE, CLEAN CATCH     Special Requests NONE     Culture  Setup Time       Value: 08/12/2014 14:53     Performed at [place]       Value: >=100,000 COLONIES/ML     Performed at [facility]   Culture       Value: Multiple bacterial morphotypes present, none predominant. Suggest appropriate recollection if clinically indicated.     Performed at [facility]   Report Status 08/13/2014 FINAL    GLUCOSE, CAPILLARY     Status: None   Collection Time    08/12/14  1:22 PM      Result Value Ref Range   Glucose-Capillary 85  70 - 99 mg/dL  GLUCOSE, CAPILLARY     Status: None   Collection Time    08/12/14  8:50 PM      Result Value Ref Range   Glucose-Capillary 96  70 - 99 mg/dL  GLUCOSE, CAPILLARY     Status: None   Collection Time    08/13/14  7:31 AM      Result Value Ref Range   Glucose-Capillary 86  70 - 99 mg/dL   Comment 1 Notify RN    GLUCOSE, CAPILLARY     Status: None   Collection Time    08/13/14  2:10 PM      Result Value Ref Range   Glucose-Capillary 88  70 - 99 mg/dL  GLUCOSE, CAPILLARY     Status: None   Collection Time    08/13/14   8:22 PM      Result Value Ref Range   Glucose-Capillary 93  70 - 99 mg/dL  GLUCOSE, CAPILLARY     Status: Abnormal   Collection Time    08/14/14  8:12 AM  Result Value Ref Range   Glucose-Capillary 103 (*) 70 - 99 mg/dL  GLUCOSE, CAPILLARY     Status: None   Collection Time    08/14/14  1:51 PM      Result Value Ref Range   Glucose-Capillary 98  70 - 99 mg/dL  GLUCOSE, CAPILLARY     Status: Abnormal   Collection Time    08/14/14  4:24 PM      Result Value Ref Range   Glucose-Capillary 164 (*) 70 - 99 mg/dL  GLUCOSE, CAPILLARY     Status: Abnormal   Collection Time    08/14/14  9:56 PM      Result Value Ref Range   Glucose-Capillary 107 (*) 70 - 99 mg/dL  GLUCOSE, CAPILLARY     Status: None   Collection Time    08/15/14  6:40 AM      Result Value Ref Range   Glucose-Capillary 94  70 - 99 mg/dL     HEENT: neck clean Cardio: RRR and no murmur Resp: CTA B/L and unlabored, clear.  GI: BS positive and PEG site clean. Extremity:  Bilateral pedal edema 1+ Skin:   Intact other than above Neuro:   Initiates more. Inconsistent attention and eye contact however.  Abnormal Sensory reduced on left difficult to assess secondary to MS, Abnormal Motor 4- LUE, 3- RLE, 2- LLE, 5/5 RUE grossly Moves RLE spontaneously.  Musc/Skel:  Normal GEN NAD   Assessment/Plan: 1. Functional deficits secondary to left Thalamic ICH, Hydrocephalus with Right  shunt which require 3+ hours per day of interdisciplinary therapy in a comprehensive inpatient rehab setting. Physiatrist is providing close team supervision and 24 hour management of active medical problems listed below. Physiatrist and rehab team continue to assess barriers to discharge/monitor patient progress toward functional and medical goals.  FIM: FIM - Bathing Bathing Steps Patient Completed: Abdomen;Front perineal area Bathing: 1: Total-Patient completes 0-2 of 10 parts or less than 25%  FIM - Upper Body Dressing/Undressing Upper  body dressing/undressing steps patient completed: Put head through opening of pull over shirt/dress Upper body dressing/undressing: 2: Max-Patient completed 25-49% of tasks FIM - Lower Body Dressing/Undressing Lower body dressing/undressing: 1: Two helpers  FIM - Toileting Toileting: 0: No continent bowel/bladder events this shift  FIM - Air cabin crew Transfers: 0-Activity did not occur  FIM - Control and instrumentation engineer Devices: Bed rails Bed/Chair Transfer: 0: Activity did not occur  FIM - Locomotion: Wheelchair Locomotion: Wheelchair: 0: Activity did not occur FIM - Locomotion: Ambulation Locomotion: Ambulation Assistive Devices: Nutritional therapist Ambulation/Gait Assistance: 1: +2 Total assist Locomotion: Ambulation: 0: Activity did not occur  Comprehension Comprehension Mode: Auditory Comprehension: 2-Understands basic 25 - 49% of the time/requires cueing 51 - 75% of the time  Expression Expression Mode: Verbal Expression Assistive Devices: 6-Talk trach valve Expression: 1-Expresses basis less than 25% of the time/requires cueing greater than 75% of the time.  Social Interaction Social Interaction Mode: Asleep Social Interaction: 1-Interacts appropriately less than 25% of the time. May be withdrawn or combative.  Problem Solving Problem Solving Mode: Asleep Problem Solving: 1-Solves basic less than 25% of the time - needs direction nearly all the time or does not effectively solve problems and may need a restraint for safety  Memory Memory Mode: Asleep Memory: 1-Recognizes or recalls less than 25% of the time/requires cueing greater than 75% of the time Medical Problem List and Plan:  1. Functional deficits secondary to Left thalamic bleed with hydrocephalus.  2.  DVT Prophylaxis/Anticoagulation/has IVCF: Pharmaceutical: Lovenox-  -TEDs for edema, elevation  -dopplers + (has hx)  3. Chronic B-knee/Pain Management: Prn oxycodone.   Voltaren  gel for knees.  -poorly-compliant pain patient in my practice 4. Mood: Monitor as mentation improves.  5. Neuropsych: This patient is not capable of making decisions on his own behalf.   -continue low dose ritalin---will not push higher given CV history 6. Skin/Wound Care:   Continue air mattress overlay.  .  7. DM type 2: Will monitor BS with ac/hs checks. Continue Levemir bid and adjust as indicated.  -reasonable control still  8. Enterobacter tracheobronchitis: abx completed  -decannulated   7. HTN/CAD: continue Norvasc daily and metoprolol bid.   -added HCTZ to assist with edema as well as bp 8. ABLA: on iron supplement.   9.  Low grade fever temp--likely atelectasis----improving  -continue IS, OOB  LOS (Days) 16 A FACE TO FACE EVALUATION WAS PERFORMED  Anajulia Leyendecker T 08/15/2014, 8:14 AM

## 2014-08-16 ENCOUNTER — Inpatient Hospital Stay (HOSPITAL_COMMUNITY): Payer: Medicare Other

## 2014-08-16 ENCOUNTER — Inpatient Hospital Stay (HOSPITAL_COMMUNITY): Payer: Medicare Other | Admitting: Speech Pathology

## 2014-08-16 ENCOUNTER — Inpatient Hospital Stay (HOSPITAL_COMMUNITY): Payer: Medicare Other | Admitting: Occupational Therapy

## 2014-08-16 LAB — GLUCOSE, CAPILLARY
GLUCOSE-CAPILLARY: 100 mg/dL — AB (ref 70–99)
GLUCOSE-CAPILLARY: 131 mg/dL — AB (ref 70–99)
Glucose-Capillary: 146 mg/dL — ABNORMAL HIGH (ref 70–99)
Glucose-Capillary: 111 mg/dL — ABNORMAL HIGH (ref 70–99)

## 2014-08-16 LAB — BASIC METABOLIC PANEL
Anion gap: 13 (ref 5–15)
BUN: 11 mg/dL (ref 6–23)
CHLORIDE: 100 meq/L (ref 96–112)
CO2: 31 meq/L (ref 19–32)
Calcium: 9.5 mg/dL (ref 8.4–10.5)
Creatinine, Ser: 0.73 mg/dL (ref 0.50–1.35)
GFR calc Af Amer: >90 mL/min (ref >90)
GFR calc non Af Amer: >90 mL/min (ref >90)
GLUCOSE: 102 mg/dL — AB (ref 70–99)
Potassium: 3.3 mEq/L — ABNORMAL LOW (ref 3.7–5.3)
Sodium: 144 mEq/L (ref 137–147)

## 2014-08-16 NOTE — Progress Notes (Signed)
Zero residual from PEG. BLE's with edema, elevated on pillows. Dressing to PEG site-CD & I. 2 large loose stools, in past 12 hours. Does not call for assistance. Tony Davis A

## 2014-08-16 NOTE — Progress Notes (Signed)
Speech Language Pathology Weekly Progress and Session Note  Patient Details  Name: Tony Davis MRN: [id number] Date of Birth: 22-Sep-1961  Beginning of progress report period: August 09, 2014 End of progress report period: August 16, 2014  Today's Date: 08/16/2014 SLP Individual Time: 1330-1415 SLP Individual Time Calculation (min): 45 min  Short Term Goals: Week 2: SLP Short Term Goal 1 (Week 2): Patient will initiate verbal response to yes/no questions with Max multimodal cues. SLP Short Term Goal 1 - Progress (Week 2): Met SLP Short Term Goal 2 (Week 2): Patient will consume trials of thin liquids via cup with no overt s/s of aspiration.   SLP Short Term Goal 2 - Progress (Week 2): Met SLP Short Term Goal 3 (Week 2): Patient will follow basic, 1-step directions during self-care tasks with Max multimodal cues. SLP Short Term Goal 3 - Progress (Week 2): Met SLP Short Term Goal 4 (Week 2): Patient will demonstrate use of call bell with Max multimodal cues. SLP Short Term Goal 4 - Progress (Week 2): Not met SLP Short Term Goal 5 (Week 2): Patient will sustain attention to basic self-care task for ~62mnute with Max multimodal cues.  SLP Short Term Goal 5 - Progress (Week 2): Met    New Short Term Goals: Week 3: SLP Short Term Goal 1 (Week 3): Patient will sustain attention to basic self-care task for ~2 minutes with Max multimodal cues.  SLP Short Term Goal 2 (Week 3): Patient will demonstrate use of call bell with Max multimodal cues. SLP Short Term Goal 3 (Week 3): Patient will follow basic, 1-step directions during self-care tasks with Mod multimodal cues. SLP Short Term Goal 4 (Week 3): Patient will initiate verbal response to yes/no questions with Mod multimodal cues. SLP Short Term Goal 5 (Week 3): Patient will utilize swallowing compensatory strategies with current diet to minimize overt s/s of aspiration with Min A multimodal cues.  SLP Short Term Goal 6 (Week 3): Patient  will initiate functional tasks with Mod A multimodal cues.   Weekly Progress Updates: Patient has made functional gains and has met 4 of 5 short term goals this reporting period due to increased ability to follow 1 step commands, increased focused attention, increased verbal responses and increased swallowing function.  Currently, patient requires Max assist for initiation of verbal expression of and functional tasks, sustained attention and following 1-step commands.   Patient continues to require total A for orientation, working memory, functional problem solving and awareness. Patient had MBS on 9/4 and did not demonstrate penetration or aspiration with any consistency and therefore was recommended to initiate a diet of Dys. 2 textures with thin liquids. However, due to patient's decreased attention, recommend initiation of Dys. 1 textures with thin liquids. Patient's wife present today during session and provided basic education in regards to the patient's current cognitive-linguistic and swallowing function and goals of skilled SLP intervention. She verbalized understanding but will require reinforcement. Patient would benefit from continued skilled SLP intervention to maximize his cognitive-linguistic and swallowing function in order to maximize his functional independence prior to discharge with 24/7 assist.   Intensity: Minumum of 1-2 x/day, 30 to 90 minutes Frequency: 5 out of 7 days Duration/Length of Stay: 2 weeks Treatment/Interventions: Cognitive remediation/compensation;Speech/Language facilitation;Internal/external aids;Patient/family education;Dysphagia/aspiration precaution training;Functional tasks;Cueing hierarchy   Daily Session Skilled Therapeutic Interventions: Skilled treatment session focused on cognitive-linguistic and dysphagia goals. Upon arrival, pt was awake while sitting up in a tilt-in-space wheelchair and initiated a handshake and verbal  greeting to clinician. SLP  facilitated session by providing a "snack" of Dys. 1 textures and thin liquids. Patient did not demonstrate any overt s/s of aspiration but required Max A multimodal cues for small bites and Max A multimodal cues for focused attention and functional problem solving with self-feeding. Patient with little verbal responses to yes/no questions, however, patient utilized vocalizations such as "uh huh, etc" for responses. Patient's wife present and educated on patient's current cognitive-linguistic and swallowing function and goals of skilled SLP intervention. She verbalized understanding but will need reinforcement. Continue with current plan of care.    FIM:  Comprehension Comprehension Mode: Auditory Comprehension: 3-Understands basic 50 - 74% of the time/requires cueing 25 - 50%  of the time Expression Expression: 2-Expresses basic 25 - 49% of the time/requires cueing 50 - 75% of the time. Uses single words/gestures. Social Interaction Social Interaction: 2-Interacts appropriately 25 - 49% of time - Needs frequent redirection. Problem Solving Problem Solving: 1-Solves basic less than 25% of the time - needs direction nearly all the time or does not effectively solve problems and may need a restraint for safety Memory Memory: 1-Recognizes or recalls less than 25% of the time/requires cueing greater than 75% of the time FIM - Eating Eating Activity: 4: Helper occasionally scoops food on utensil;5: Needs verbal cues/supervision Pain No/Denies Pain   Therapy/Group: Individual Therapy  Keora Eccleston 08/16/2014, 3:53 PM

## 2014-08-16 NOTE — Progress Notes (Signed)
Occupational Therapy Session Note  Patient Details  Name: Tony Davis MRN: [id number] Date of Birth: 1961-08-13  Today's Date: 08/16/2014 OT Individual Time: 4401-0272 OT Individual Time Calculation (min): 45 min   Short Term Goals: Week 2:  OT Short Term Goal 1 (Week 2): Pt will maintain sitting balance EOB for 5 mins with min A while performing functional tasks OT Short Term Goal 2 (Week 2): Pt will perform UB dressing with mod A while seated OT Short Term Goal 3 (Week 2): Pt will perform LB dressing with max A OT Short Term Goal 4 (Week 2): Pt will engage in 5 mins of functional activity before taking a rest break to increase endurance OT Short Term Goal 5 (Week 2): Pt will sustain attention for 60 seconds with min verbal cues during funcitonal tasks  Skilled Therapeutic Interventions/Progress Updates:  Patient resting in bed upon arrival.  Engaged in self care retraining to include bath, dress and w/c transfer.  Focused on attention to task, rolling to replace brief and wash peri area and buttocks, endurance, supine to sit EOB for bath and dress to include sitting balance and stand to pull up pants.  Patient was total assist with bed mobility and bathing beri area and buttocks however, once seated EOB he was engaged for the entire session and only need cues to stay on task and not get distracted about 4 times.  Patient performed scoot/squat pivot transfer to w/c with +2 to stabilize w/c.  Therapy Documentation Precautions:  Precautions Precautions: Fall;Other (comment) Precaution Comments: PEG/abdominal precautions, trach, Bilateral thigh high TED hose  Restrictions Weight Bearing Restrictions: No Pain: No indication of pain ADL: See FIM for current functional status  Therapy/Group: Individual Therapy  Areliz Rothman 08/16/2014, 7:38 AM

## 2014-08-16 NOTE — Progress Notes (Addendum)
Physical Therapy Session Note  Patient Details  Name: Tony Davis MRN: [id number] Date of Birth: 1961-07-31  Today's Date: 08/16/2014 PT Individual Time: 7893-8101; Treatment Session 2: 1415-1445 PT Individual Time Calculation (min): 60 min; Treatment Session 2: 42min  Short Term Goals: Week 3:  PT Short Term Goal 1 (Week 3): Pt will demonstrated focused attention to therapeutic task for 30 seconds with max multimodal cues PT Short Term Goal 2 (Week 3): Pt to perform bed mobility with mod Ax1person, 50% of time PT Short Term Goal 3 (Week 3): Pt to perform bed<>w/c transfers with max Ax1 person PT Short Term Goal 4 (Week 3): Pt to demonstrate standing balance with max Ax1person, 50% of time PT Short Term Goal 5 (Week 3): Pt to ambulate 100' with mod Ax1 person  Skilled Therapeutic Interventions/Progress Updates:  1:1. Pt received sitting in tilt-in-space w/c, just completed OT session PT taking over. Focus this session on alertness/arousal, focused>sustained attention, following simple step commands, initiation, gait training, functional transfers and w/c propulsion. Pt req total A for orientation, but overall mod multimodal cues for all initiation this session due to increased alertness, attention and ability to follow simple commands ~50% of time. Pt with excellent tolerance to ambulation this session, pt amb 30'x1, 50'x1 and 100'x1 w/ use of RW and Ax2 persons (mod Ax1 person for ambulation, +2 for w/c follow). Pt with good response to visual targets to identify distance goals.  Multimodal cues including manual facilitation for increased pace resulting in slighlty more normalized gait pattern. Gait characterized by overall flexed posture, very narrow BOS, decreased foot clearance and short step lengths. Pt req mod-max Ax1person for multiple t/f sit<>stand from various surfaces including couch and recliner in therapy apartment. Pt reporting onset of R knee pain following prolonged standing  tasks, RN made aware and provided pain medicine. Trial w/c propulsion in basic w/c, pt able to propel w/c 15'x1 and 10'x1 w/ B UE and min A, mod cueing for technique and R awareness. Pt with progressively decreased verbalizations during tx session due to fatigue, responding appropriately to open ended questions approx~50% of time. Pt assisted back to tilt-in-space w/c at end of session due to fatigue. Pt left w/ all needs in reach, quick release belt in place, wife in room and RN aware.   Treatment Session 2:  1:1. Pt received sitting in tilt-in-space w/c, ready for therapy with wife present to observe session. Focus this session on alertness/arousal, focused attention, following simple commands, initiation and functional transfers. Pt stated that he did not want to try walking this session, but would not clarify as to why. Attempted t/f sit<>stand w/ Ax2 persons, but pt would not initiate. Pt eventually stating "yes" to feeling fatigued and having pain. Pt engaged in hitting tennis ball with racquet. Pt req max multimodal cues for focused attention and count up to 10 non-consecutive hits, 2x. Pt very lethargic this session w/ significantly decreased initiation compared to AM session. Pt req max A for squat pivot t/f bed>w/c and Ax2 persons for t/f sit>sup. Pt left supine in bed in care of nurse tech.   Therapy Documentation Precautions:  Precautions Precautions: Fall;Other (comment) Precaution Comments: PEG/abdominal precautions, trach, Bilateral thigh high TED hose  Restrictions Weight Bearing Restrictions: No  See FIM for current functional status  Therapy/Group: Individual Therapy  Tony Davis 08/16/2014, 9:47 AM

## 2014-08-16 NOTE — Progress Notes (Signed)
Subjective/Complaints:   No complaints. Pain under control. No sob, cough, Review of Systems -remains limited due to cognitive status   Objective: Vital Signs: Blood pressure 141/97, pulse 95, temperature 97.8 F (36.6 C), temperature source Oral, resp. rate 20, weight 112 kg (246 lb 14.6 oz), SpO2 99.00%. No results found. Results for orders placed during the hospital encounter of 07/30/14 (from the past 72 hour(s))  GLUCOSE, CAPILLARY     Status: None   Collection Time    08/13/14  2:10 PM      Result Value Ref Range   Glucose-Capillary 88  70 - 99 mg/dL  GLUCOSE, CAPILLARY     Status: None   Collection Time    08/13/14  8:22 PM      Result Value Ref Range   Glucose-Capillary 93  70 - 99 mg/dL  GLUCOSE, CAPILLARY     Status: Abnormal   Collection Time    08/14/14  8:12 AM      Result Value Ref Range   Glucose-Capillary 103 (*) 70 - 99 mg/dL  GLUCOSE, CAPILLARY     Status: None   Collection Time    08/14/14  1:51 PM      Result Value Ref Range   Glucose-Capillary 98  70 - 99 mg/dL  GLUCOSE, CAPILLARY     Status: Abnormal   Collection Time    08/14/14  4:24 PM      Result Value Ref Range   Glucose-Capillary 164 (*) 70 - 99 mg/dL  GLUCOSE, CAPILLARY     Status: Abnormal   Collection Time    08/14/14  9:56 PM      Result Value Ref Range   Glucose-Capillary 107 (*) 70 - 99 mg/dL  GLUCOSE, CAPILLARY     Status: None   Collection Time    08/15/14  6:40 AM      Result Value Ref Range   Glucose-Capillary 94  70 - 99 mg/dL  GLUCOSE, CAPILLARY     Status: Abnormal   Collection Time    08/15/14 11:47 AM      Result Value Ref Range   Glucose-Capillary 157 (*) 70 - 99 mg/dL  GLUCOSE, CAPILLARY     Status: Abnormal   Collection Time    08/15/14  1:26 PM      Result Value Ref Range   Glucose-Capillary 140 (*) 70 - 99 mg/dL  GLUCOSE, CAPILLARY     Status: Abnormal   Collection Time    08/15/14  8:12 PM      Result Value Ref Range   Glucose-Capillary 102 (*) 70 - 99  mg/dL   Comment 1 Notify RN    BASIC METABOLIC PANEL     Status: Abnormal   Collection Time    08/16/14  5:45 AM      Result Value Ref Range   Sodium 144  137 - 147 mEq/L   Potassium 3.3 (*) 3.7 - 5.3 mEq/L   Chloride 100  96 - 112 mEq/L   CO2 31  19 - 32 mEq/L   Glucose, Bld 102 (*) 70 - 99 mg/dL   BUN 11  6 - 23 mg/dL   Creatinine, Ser 0.73  0.50 - 1.35 mg/dL   Calcium 9.5  8.4 - 10.5 mg/dL   GFR calc non Af Amer >90  >90 mL/min   GFR calc Af Amer >90  >90 mL/min   Comment: (NOTE)     The eGFR has been calculated using the CKD EPI equation.  This calculation has not been validated in all clinical situations.     eGFR's persistently <90 mL/min signify possible Chronic Kidney     Disease.   Anion gap 13  5 - 15  GLUCOSE, CAPILLARY     Status: Abnormal   Collection Time    08/16/14  6:56 AM      Result Value Ref Range   Glucose-Capillary 100 (*) 70 - 99 mg/dL   Comment 1 Notify RN       HEENT: neck clean. Trach site healed/closed Cardio: RRR and no murmur Resp: CTA B/L and unlabored, clear.  GI: BS positive and PEG site clean. Extremity:  Bilateral pedal edema trace to 1+--improved Skin:   Intact other than above Neuro:   Initiates more. Inconsistent attention and eye contact however.  Abnormal Sensory reduced on left difficult to assess secondary to MS, Abnormal Motor 4- LUE, 3- to 3+ RLE, 2- to 3 LLE, 5/5 RUE grossly ---inconsistent and needs cues to activate all 4 limbs Musc/Skel:  Normal GEN NAD   Assessment/Plan: 1. Functional deficits secondary to left Thalamic ICH, Hydrocephalus with Right  shunt which require 3+ hours per day of interdisciplinary therapy in a comprehensive inpatient rehab setting. Physiatrist is providing close team supervision and 24 hour management of active medical problems listed below. Physiatrist and rehab team continue to assess barriers to discharge/monitor patient progress toward functional and medical goals.  FIM: FIM -  Bathing Bathing Steps Patient Completed: Abdomen;Front perineal area Bathing: 1: Total-Patient completes 0-2 of 10 parts or less than 25%  FIM - Upper Body Dressing/Undressing Upper body dressing/undressing steps patient completed: Put head through opening of pull over shirt/dress Upper body dressing/undressing: 2: Max-Patient completed 25-49% of tasks FIM - Lower Body Dressing/Undressing Lower body dressing/undressing: 1: Two helpers  FIM - Toileting Toileting: 0: No continent bowel/bladder events this shift  FIM - Air cabin crew Transfers: 0-Activity did not occur  FIM - Control and instrumentation engineer Devices: Bed rails Bed/Chair Transfer: 1: Mechanical lift  FIM - Locomotion: Wheelchair Locomotion: Wheelchair: 1: Total Assistance/staff pushes wheelchair (Pt<25%) FIM - Locomotion: Ambulation Locomotion: Ambulation Assistive Devices: Nutritional therapist Ambulation/Gait Assistance: 1: +2 Total assist Locomotion: Ambulation: 0: Activity did not occur  Comprehension Comprehension Mode: Auditory Comprehension: 2-Understands basic 25 - 49% of the time/requires cueing 51 - 75% of the time  Expression Expression Mode: Verbal Expression Assistive Devices: 6-Talk trach valve Expression: 1-Expresses basis less than 25% of the time/requires cueing greater than 75% of the time.  Social Interaction Social Interaction Mode: Asleep Social Interaction: 1-Interacts appropriately less than 25% of the time. May be withdrawn or combative.  Problem Solving Problem Solving Mode: Asleep Problem Solving: 1-Solves basic less than 25% of the time - needs direction nearly all the time or does not effectively solve problems and may need a restraint for safety  Memory Memory Mode: Asleep Memory: 1-Recognizes or recalls less than 25% of the time/requires cueing greater than 75% of the time Medical Problem List and Plan:  1. Functional deficits secondary to Left thalamic bleed  with hydrocephalus.  2. DVT Prophylaxis/Anticoagulation/has IVCF: Pharmaceutical: Lovenox-  -TEDs for edema, elevation  -dopplers + (has hx)  3. Chronic B-knee/Pain Management: Prn oxycodone.   Voltaren gel for knees.  -poorly-compliant pain patient in my practice 4. Mood: Monitor as mentation improves.  5. Neuropsych: This patient is not capable of making decisions on his own behalf.   -continue low dose ritalin---will not push higher given CV history 6.  Skin/Wound Care:   Continue air mattress overlay.  .  7. DM type 2: Will monitor BS with ac/hs checks. Continue Levemir bid and adjust as indicated.  -reasonable control still  8. Enterobacter tracheobronchitis: abx completed  -decannulated   7. HTN/CAD: continue Norvasc daily and metoprolol bid.   -added HCTZ to assist with edema as well as bp---has helped  -increase norvasc to 37m 8. ABLA: on iron supplement.   9.  Low grade fever temp--likely atelectasis----improving  -continue IS, OOB  LOS (Days) 17 A FACE TO FACE EVALUATION WAS PERFORMED  , T 08/16/2014, 7:40 AM

## 2014-08-17 ENCOUNTER — Inpatient Hospital Stay (HOSPITAL_COMMUNITY): Payer: Medicare Other

## 2014-08-17 ENCOUNTER — Inpatient Hospital Stay (HOSPITAL_COMMUNITY): Payer: Medicare Other | Admitting: Speech Pathology

## 2014-08-17 ENCOUNTER — Inpatient Hospital Stay (HOSPITAL_COMMUNITY): Payer: Medicare Other | Admitting: Occupational Therapy

## 2014-08-17 ENCOUNTER — Encounter (HOSPITAL_COMMUNITY): Payer: Medicare Other | Admitting: Occupational Therapy

## 2014-08-17 LAB — GLUCOSE, CAPILLARY
GLUCOSE-CAPILLARY: 102 mg/dL — AB (ref 70–99)
Glucose-Capillary: 101 mg/dL — ABNORMAL HIGH (ref 70–99)
Glucose-Capillary: 140 mg/dL — ABNORMAL HIGH (ref 70–99)
Glucose-Capillary: 160 mg/dL — ABNORMAL HIGH (ref 70–99)
Glucose-Capillary: 168 mg/dL — ABNORMAL HIGH (ref 70–99)
Glucose-Capillary: 187 mg/dL — ABNORMAL HIGH (ref 70–99)

## 2014-08-17 NOTE — Progress Notes (Signed)
Physical Therapy Session Note  Patient Details  Name: Tony Davis MRN: [id number] Date of Birth: 05/09/1961  Today's Date: 08/17/2014 PT Co-Treatment Time: 0800-0820 (Co-tx with SLP for total time [phone]) PT Co-Treatment Time Calculation (min): 20 min  Short Term Goals: Week 3:  PT Short Term Goal 1 (Week 3): Pt will demonstrated focused attention to therapeutic task for 30 seconds with max multimodal cues PT Short Term Goal 2 (Week 3): Pt to perform bed mobility with mod Ax1person, 50% of time PT Short Term Goal 3 (Week 3): Pt to perform bed<>w/c transfers with max Ax1 person PT Short Term Goal 4 (Week 3): Pt to demonstrate standing balance with max Ax1person, 50% of time PT Short Term Goal 5 (Week 3): Pt to ambulate 100' with mod Ax1 person  Skilled Therapeutic Interventions/Progress Updates:  Co-tx this session with SLP for focus on activity tolerance, initiation, sustained attention, functional transfers, w/c propulsion,and positioning to eating breakfast. Pt received semi-reclined in bed, alert. Pt found to be incontinent of urine, mod-max A for B rolling for clean up. Pt req mod A for t/f sup>sit EOB, max A for squat pivot t/f bed>w/c and supervision for w/c propulsion 30'x2 w/ B UE. Pt req mod cueing for overall initiation and sustained attention to therapeutic tasks and able to follow simple commands 50-75% of time. Pt positioned in basic w/c for increased trunk engagement with w/c slightly away from table to encourage anterior lean to engage in self feeding w/out arm rests. See SLP note for increased detail regarding breakfast. Pt left sitting in basic w/c in care of nurse tech at end of session for continued supervision while eating.   Therapy Documentation Precautions:  Precautions Precautions: Fall;Other (comment) Precaution Comments: PEG/abdominal precautions, trach, Bilateral thigh high TED hose  Restrictions Weight Bearing Restrictions: No   See FIM for current  functional status  Therapy/Group: Co-Treatment  Otis Brace S 08/17/2014, 8:41 AM

## 2014-08-17 NOTE — Progress Notes (Signed)
CALORIE COUNT   Calorie count initiated for 48 hours (9/8-9/10). Document percent consumed for each item on the patient's meal tray ticket and keep in envelope. Also document percent of any supplement or snack pt consumes and keep documentation in envelope for dietitian to review.   Kallie Locks, MS, Provisional LDN Pager # [phone] After hours/ weekend pager # [phone]

## 2014-08-17 NOTE — Progress Notes (Signed)
Diet initiated--will hold TF if patient eats >/=50%. Calorie count. Anticipate decrease in insulin needs. Hgb A1c- 6.8--DM diet controlled at home.

## 2014-08-17 NOTE — Progress Notes (Signed)
Speech Language Pathology Daily Session Note  Patient Details  Name: JADRIEN NARINE MRN: [id number] Date of Birth: 1960/12/27  Today's Date: 08/17/2014 SLP Co-Treatment Time: 1275-1700 SLP Co-Treatment Time Calculation (min): 25 min  Short Term Goals: Week 3: SLP Short Term Goal 1 (Week 3): Patient will sustain attention to basic self-care task for ~2 minutes with Max multimodal cues.  SLP Short Term Goal 2 (Week 3): Patient will demonstrate use of call bell with Max multimodal cues. SLP Short Term Goal 3 (Week 3): Patient will follow basic, 1-step directions during self-care tasks with Mod multimodal cues. SLP Short Term Goal 4 (Week 3): Patient will initiate verbal response to yes/no questions with Mod multimodal cues. SLP Short Term Goal 5 (Week 3): Patient will utilize swallowing compensatory strategies with current diet to minimize overt s/s of aspiration with Min A multimodal cues.  SLP Short Term Goal 6 (Week 3): Patient will initiate functional tasks with Mod A multimodal cues.   Skilled Therapeutic Interventions: Skilled co-treatment session with PT focused on cognitive-linguistic and dysphagia goals along with proper positioning for PO intake. Patient received supine in bed and was incontinent of urine and required Mod-Max multimodal cues for bed mobility during self-care tasks and was transferred to wheelchair with Mod-Max A. Patient consumed breakfast meal of Dys. 1 textures with thin liquids without overt s/s of aspiration but required overall Mod A for initiation and attention to self-feeding. Patient followed 50-75% of commands with extra time but required Max multimodal cues to utilize verbal expression for responses instead of gestures. Patient left with NT to finish breakfast. Continue with current plan of care.    FIM:  Comprehension Comprehension Mode: Auditory Comprehension: 3-Understands basic 50 - 74% of the time/requires cueing 25 - 50%  of the  time Expression Expression Mode: Verbal Expression: 2-Expresses basic 25 - 49% of the time/requires cueing 50 - 75% of the time. Uses single words/gestures. Social Interaction Social Interaction: 2-Interacts appropriately 25 - 49% of time - Needs frequent redirection. Problem Solving Problem Solving: 1-Solves basic less than 25% of the time - needs direction nearly all the time or does not effectively solve problems and may need a restraint for safety Memory Memory: 1-Recognizes or recalls less than 25% of the time/requires cueing greater than 75% of the time FIM - Eating Eating Activity: 4: Helper occasionally scoops food on utensil;5: Needs verbal cues/supervision  Pain Pain Assessment Pain Assessment: No/denies pain  Therapy/Group: Individual Therapy  Dierks Wach 08/17/2014, 10:58 AM

## 2014-08-17 NOTE — Progress Notes (Signed)
Occupational Therapy Session Note  Patient Details  Name: Tony Davis MRN: [id number] Date of Birth: May 23, 1961  Today's Date: 08/17/2014 OT Individual Time: 1330-1400 OT Individual Time Calculation (min): 30 min   Short Term Goals: Week 3:  OT Short Term Goal 1 (Week 3): PT will demonstrate sustained attention to functional task for 3 min with mod cuing OT Short Term Goal 2 (Week 3): Pt will perform LB dressing wtih mod A  OT Short Term Goal 3 (Week 3): Pt will self feed 50% of a meal with mod cuing OT Short Term Goal 4 (Week 3): Pt will perform shower transfer with max A  OT Short Term Goal 5 (Week 3): Pt will bathe with mod A at shower level   Skilled Therapeutic Interventions/Progress Updates:  Patient resting in w/c at nursing station upon arrival.  Engaged in toilet transfer and toileting secondary appeared that patient had incontinent episode of loose bowel.  Positioned BSC next to bed and patient performed stand step transfer w/c>BSC>bed with a significant amount of extra time and mod assist with max multi modal cues.  Patient often distracted therefore unsafe with transitional movements secondary he can be in the middle of a task then stop when it is unsafe and decide to try to take care of something entirely irrelevant to the task at hand. Patient's w/c cushion and clothes were soiled and were removed and patient stood with min assist while he attempted to bathe his front peri area however, required assist due to the extent of the large BM.  RN assisted with cleaning his buttocks.  Patient assisted back to bed once clean brief was applied. Patient was +2-3 for toileting and transfers due to extent of bowel accident.  Therapy Documentation Precautions:  Precautions Precautions: Fall;Other (comment) Precaution Comments: PEG/abdominal precautions, trach, Bilateral thigh high TED hose  Restrictions Weight Bearing Restrictions: No Pain: No report of pain See FIM for current  functional status  Therapy/Group: Individual Therapy  Janeane Cozart, [place] 08/17/2014, 5:04 PM

## 2014-08-17 NOTE — Progress Notes (Signed)
Subjective/Complaints:   No new issues. Right knee sore with activity. Slept well Review of Systems -remains limited due to cognitive status   Objective: Vital Signs: Blood pressure 140/87, pulse 92, temperature 98.8 F (37.1 C), temperature source Oral, resp. rate 18, weight 112 kg (246 lb 14.6 oz), SpO2 98.00%. No results found. Results for orders placed during the hospital encounter of 07/30/14 (from the past 72 hour(s))  GLUCOSE, CAPILLARY     Status: Abnormal   Collection Time    08/14/14  8:12 AM      Result Value Ref Range   Glucose-Capillary 103 (*) 70 - 99 mg/dL  GLUCOSE, CAPILLARY     Status: None   Collection Time    08/14/14  1:51 PM      Result Value Ref Range   Glucose-Capillary 98  70 - 99 mg/dL  GLUCOSE, CAPILLARY     Status: Abnormal   Collection Time    08/14/14  4:24 PM      Result Value Ref Range   Glucose-Capillary 164 (*) 70 - 99 mg/dL  GLUCOSE, CAPILLARY     Status: Abnormal   Collection Time    08/14/14  9:56 PM      Result Value Ref Range   Glucose-Capillary 107 (*) 70 - 99 mg/dL  GLUCOSE, CAPILLARY     Status: None   Collection Time    08/15/14  6:40 AM      Result Value Ref Range   Glucose-Capillary 94  70 - 99 mg/dL  GLUCOSE, CAPILLARY     Status: Abnormal   Collection Time    08/15/14 11:47 AM      Result Value Ref Range   Glucose-Capillary 157 (*) 70 - 99 mg/dL  GLUCOSE, CAPILLARY     Status: Abnormal   Collection Time    08/15/14  1:26 PM      Result Value Ref Range   Glucose-Capillary 140 (*) 70 - 99 mg/dL  GLUCOSE, CAPILLARY     Status: Abnormal   Collection Time    08/15/14  8:12 PM      Result Value Ref Range   Glucose-Capillary 102 (*) 70 - 99 mg/dL   Comment 1 Notify RN    BASIC METABOLIC PANEL     Status: Abnormal   Collection Time    08/16/14  5:45 AM      Result Value Ref Range   Sodium 144  137 - 147 mEq/L   Potassium 3.3 (*) 3.7 - 5.3 mEq/L   Chloride 100  96 - 112 mEq/L   CO2 31  19 - 32 mEq/L   Glucose, Bld 102  (*) 70 - 99 mg/dL   BUN 11  6 - 23 mg/dL   Creatinine, Ser 0.73  0.50 - 1.35 mg/dL   Calcium 9.5  8.4 - 10.5 mg/dL   GFR calc non Af Amer >90  >90 mL/min   GFR calc Af Amer >90  >90 mL/min   Comment: (NOTE)     The eGFR has been calculated using the CKD EPI equation.     This calculation has not been validated in all clinical situations.     eGFR's persistently <90 mL/min signify possible Chronic Kidney     Disease.   Anion gap 13  5 - 15  GLUCOSE, CAPILLARY     Status: Abnormal   Collection Time    08/16/14  6:56 AM      Result Value Ref Range   Glucose-Capillary 100 (*) 70 -  99 mg/dL   Comment 1 Notify RN    GLUCOSE, CAPILLARY     Status: Abnormal   Collection Time    08/16/14  1:52 PM      Result Value Ref Range   Glucose-Capillary 131 (*) 70 - 99 mg/dL   Comment 1 Notify RN    GLUCOSE, CAPILLARY     Status: Abnormal   Collection Time    08/16/14  4:51 PM      Result Value Ref Range   Glucose-Capillary 146 (*) 70 - 99 mg/dL  GLUCOSE, CAPILLARY     Status: Abnormal   Collection Time    08/16/14  7:59 PM      Result Value Ref Range   Glucose-Capillary 111 (*) 70 - 99 mg/dL  GLUCOSE, CAPILLARY     Status: Abnormal   Collection Time    08/17/14 12:45 AM      Result Value Ref Range   Glucose-Capillary 168 (*) 70 - 99 mg/dL  GLUCOSE, CAPILLARY     Status: Abnormal   Collection Time    08/17/14  3:50 AM      Result Value Ref Range   Glucose-Capillary 140 (*) 70 - 99 mg/dL     HEENT: neck clean. Trach site healed/closed Cardio: RRR and no murmur Resp: CTA B/L and unlabored, clear.  GI: BS positive and PEG site clean. Extremity:  Bilateral pedal edema trace to 1+--improved Skin:   Intact other than above Neuro:   Initiates more. Inconsistent attention and eye contact however.  Abnormal Sensory reduced on left difficult to assess secondary to MS, Abnormal Motor 4- LUE, 3- to 3+ RLE, 2- to 3 LLE, 5/5 RUE grossly ---inconsistent and needs cues to activate all 4  limbs Musc/Skel:  Normal GEN NAD   Assessment/Plan: 1. Functional deficits secondary to left Thalamic ICH, Hydrocephalus with Right  shunt which require 3+ hours per day of interdisciplinary therapy in a comprehensive inpatient rehab setting. Physiatrist is providing close team supervision and 24 hour management of active medical problems listed below. Physiatrist and rehab team continue to assess barriers to discharge/monitor patient progress toward functional and medical goals.  FIM: FIM - Bathing Bathing Steps Patient Completed: Abdomen;Front perineal area Bathing: 1: Total-Patient completes 0-2 of 10 parts or less than 25%  FIM - Upper Body Dressing/Undressing Upper body dressing/undressing steps patient completed: Put head through opening of pull over shirt/dress Upper body dressing/undressing: 2: Max-Patient completed 25-49% of tasks FIM - Lower Body Dressing/Undressing Lower body dressing/undressing: 1: Two helpers  FIM - Toileting Toileting: 0: No continent bowel/bladder events this shift  FIM - Air cabin crew Transfers: 0-Activity did not occur  FIM - Control and instrumentation engineer Devices: Arm rests;Walker Bed/Chair Transfer: 2: Bed > Chair or W/C: Max A (lift and lower assist);2: Chair or W/C > Bed: Max A (lift and lower assist);1: Two helpers  FIM - Locomotion: Wheelchair Locomotion: Wheelchair: 1: Travels less than 50 ft with minimal assistance (Pt.>75%) FIM - Locomotion: Ambulation Locomotion: Ambulation Assistive Devices: Administrator Ambulation/Gait Assistance: 1: +2 Total assist (modAx1 person with w/c follow) Locomotion: Ambulation: 1: Travels less than 50 ft with moderate assistance (Pt: 50 - 74%)  Comprehension Comprehension Mode: Auditory Comprehension: 3-Understands basic 50 - 74% of the time/requires cueing 25 - 50%  of the time  Expression Expression Mode: Verbal Expression Assistive Devices: 6-Talk trach  valve Expression: 2-Expresses basic 25 - 49% of the time/requires cueing 50 - 75% of the time. Uses single words/gestures.  Social Interaction Social Interaction Mode: Asleep Social Interaction: 2-Interacts appropriately 25 - 49% of time - Needs frequent redirection.  Problem Solving Problem Solving Mode: Asleep Problem Solving: 1-Solves basic less than 25% of the time - needs direction nearly all the time or does not effectively solve problems and may need a restraint for safety  Memory Memory Mode: Asleep Memory: 1-Recognizes or recalls less than 25% of the time/requires cueing greater than 75% of the time Medical Problem List and Plan:  1. Functional deficits secondary to Left thalamic bleed with hydrocephalus.  2. DVT Prophylaxis/Anticoagulation/has IVCF: Pharmaceutical: Lovenox-  -TEDs for edema, elevation  -dopplers + (has hx)  3. Chronic B-knee/Pain Management: Prn oxycodone.   Voltaren gel for knees.  -poorly-compliant pain patient in my practice 4. Mood: Monitor as mentation improves.  5. Neuropsych: This patient is not capable of making decisions on his own behalf.   -continue low dose ritalin---will not push higher given CV history 6. Skin/Wound Care:   Continue air mattress overlay.  .  7. DM type 2: Will monitor BS with ac/hs checks. Continue Levemir bid and adjust as indicated.  -reasonable control still  8. Enterobacter tracheobronchitis: abx completed  -decannulated   7. HTN/CAD:  -metoprolol bid  -added HCTZ to assist with edema as well as bp---has helped  -increased norvasc to 67m 8. ABLA: on iron supplement.   9.  Low grade fever temp--resolved   LOS (Days) 18 A FACE TO FACE EVALUATION WAS PERFORMED  SWARTZ,ZACHARY T 08/17/2014, 7:28 AM

## 2014-08-17 NOTE — Progress Notes (Signed)
Occupational Therapy Weekly Progress Note  Patient Details  Name: Tony Davis MRN: [id number] Date of Birth: 24-Jan-1961  Beginning of progress report period: August 09, 2014 End of progress report period: August 17, 2014  Today's Date: 08/17/2014 OT Individual Time: 1000-1100 OT Individual Time Calculation (min): 60 min    Patient has met 5 of 5 short term goals.  Pt continues to make progress since the last weekly due to increased ability to follow 1 step commands, increased focused attention, increased verbal responses and improved functional mobility including sit to stand, and standing balance with UB support. Currently, patient requires Max assist for initiation of verbal expression of and functional tasks, sustained attention and following 1-step commands. Patient continues to require total A for orientation, working memory, functional problem solving and awareness. Pt can perform basic bathing and dressing tasks at sink level with max A with +2 for basic transfers. Pt can stand at sink with UE support with min to mod A for clothing management. Pt continues to be incontinent of urine and bowel with decreased awareness.   Patient continues to demonstrate the following deficits: muscle weakness, decreased cardiorespiratoy endurance, impaired timing and sequencing, unbalanced muscle activation, motor apraxia, decreased coordination and decreased motor planning, decreased visual perceptual skills, decreased midline orientation and decreased attention to right, decreased initiation, decreased attention, decreased awareness, decreased problem solving, decreased safety awareness, decreased memory and delayed processing and decreased standing balance, decreased postural control, decreased balance strategies and difficulty maintaining precautions and therefore will continue to benefit from skilled OT intervention to enhance overall performance with BADL and Reduce care partner burden.  Patient  progressing toward long term goals..  Added a self feeding and shower transfer goal.   OT Short Term Goals Week 2:  OT Short Term Goal 1 (Week 2): Pt will maintain sitting balance EOB for 5 mins with min A while performing functional tasks OT Short Term Goal 1 - Progress (Week 2): Met OT Short Term Goal 2 (Week 2): Pt will perform UB dressing with mod A while seated OT Short Term Goal 2 - Progress (Week 2): Met OT Short Term Goal 3 (Week 2): Pt will perform LB dressing with max A OT Short Term Goal 3 - Progress (Week 2): Met OT Short Term Goal 4 (Week 2): Pt will engage in 5 mins of functional activity before taking a rest break to increase endurance OT Short Term Goal 4 - Progress (Week 2): Met OT Short Term Goal 5 (Week 2): Pt will sustain attention for 60 seconds with min verbal cues during funcitonal tasks OT Short Term Goal 5 - Progress (Week 2): Met Week 3:  OT Short Term Goal 1 (Week 3): PT will demonstrate sustained attention to functional task for 3 min with mod cuing OT Short Term Goal 2 (Week 3): Pt will perform LB dressing wtih mod A  OT Short Term Goal 3 (Week 3): Pt will self feed 50% of a meal with mod cuing OT Short Term Goal 4 (Week 3): Pt will perform shower transfer with max A  OT Short Term Goal 5 (Week 3): Pt will bathe with mod A at shower level   Skilled Therapeutic Interventions/Progress Updates:    1:1 Self care retraining at sink level with focus on initiation of functional tasks, sustained attention to complete functional task, following one step commands, sit to stands, standing balance with and without UE support, functional problem solving with total A, etc. Pt able to perform sit to stand  for washing peri area and clothing management with mod A and mod A to maintain standing balance. Pt required extra time to follow through with verbal commands and mod cuing. Pt requires total A for orientation to place, situation and time. Pt was able to maintain sustained  attention to a familiar functional task for 1-2 min in a quiet environment.    Therapy Documentation Precautions:  Precautions Precautions: Fall;Other (comment) Precaution Comments: PEG/abdominal precautions, trach, Bilateral thigh high TED hose  Restrictions Weight Bearing Restrictions: No Pain: Pain Assessment Pain Assessment: No/denies pain  See FIM for current functional status  Therapy/Group: Individual Therapy  Willeen Cass [facility] 08/17/2014, 10:47 AM

## 2014-08-17 NOTE — Progress Notes (Signed)
Note/chart reviewed.  Katie Angeliyah Kirkey, RD, LDN Pager #: [phone] After-Hours Pager #: [phone]  

## 2014-08-17 NOTE — Progress Notes (Signed)
Physical Therapy Session Note  Patient Details  Name: Tony Davis MRN: [id number] Date of Birth: 04/09/1961  Today's Date: 08/17/2014 PT Individual Time: 1100-1145 PT Individual Time Calculation (min): 45 min   Short Term Goals: Week 3:  PT Short Term Goal 1 (Week 3): Pt will demonstrated focused attention to therapeutic task for 30 seconds with max multimodal cues PT Short Term Goal 2 (Week 3): Pt to perform bed mobility with mod Ax1person, 50% of time PT Short Term Goal 3 (Week 3): Pt to perform bed<>w/c transfers with max Ax1 person PT Short Term Goal 4 (Week 3): Pt to demonstrate standing balance with max Ax1person, 50% of time PT Short Term Goal 5 (Week 3): Pt to ambulate 100' with mod Ax1 person  Skilled Therapeutic Interventions/Progress Updates:  1:1. Pt received sitting in basic w/c at nurses station, alert and ready for therapy. Focus this session on sustained attention, initiation, following simple commands, functional w/c propulsion, stair negotiation and standing balance. Pt initially req mod cueing for initiation at start of session, but max cues at end of session due to fatigue and decreased attention. Pt req Ax2 persons to t/f sit<>stand 2x during session. Min A for negotiation up 3 steps and down 2 steps w/ B rail but req significantly increased time for completion. Pt demonstrating good sustained attention to boxing task, 20-30seconds x3 reps with mod A for standing balance overall due to posterior lean. Pt able to propel w/c 80' w/ B UE, non-fluid pace due to distractibility w/ mod-max cues for redirection to task in busy hallway environment. Pt left sitting in w/c at nurses station at end of session w/ all needs in reach.   Therapy Documentation Precautions:  Precautions Precautions: Fall;Other (comment) Precaution Comments: PEG/abdominal precautions, trach, Bilateral thigh high TED hose  Restrictions Weight Bearing Restrictions: No Pain: Pain Assessment Pain  Assessment: No/denies pain  See FIM for current functional status  Therapy/Group: Individual Therapy  Gilmore Laroche 08/17/2014, 11:46 AM

## 2014-08-18 ENCOUNTER — Encounter (HOSPITAL_COMMUNITY): Payer: Medicare Other | Admitting: Occupational Therapy

## 2014-08-18 ENCOUNTER — Inpatient Hospital Stay (HOSPITAL_COMMUNITY): Payer: Medicare Other | Admitting: Speech Pathology

## 2014-08-18 ENCOUNTER — Inpatient Hospital Stay (HOSPITAL_COMMUNITY): Payer: Medicare Other

## 2014-08-18 LAB — GLUCOSE, CAPILLARY
GLUCOSE-CAPILLARY: 118 mg/dL — AB (ref 70–99)
GLUCOSE-CAPILLARY: 144 mg/dL — AB (ref 70–99)
Glucose-Capillary: 105 mg/dL — ABNORMAL HIGH (ref 70–99)
Glucose-Capillary: 92 mg/dL (ref 70–99)

## 2014-08-18 MED ORDER — FREE WATER
50.0000 mL | Freq: Two times a day (BID) | Status: DC
  Administered 2014-08-18 – 2014-09-07 (×40): 50 mL

## 2014-08-18 MED ORDER — ENSURE COMPLETE PO LIQD
237.0000 mL | Freq: Two times a day (BID) | ORAL | Status: DC
  Administered 2014-08-18: 237 mL via ORAL

## 2014-08-18 NOTE — Progress Notes (Signed)
The skilled treatment note has been reviewed and SLP is in agreement.  Liberta Gimpel, M.A., CCC-SLP  [phone]   

## 2014-08-18 NOTE — Progress Notes (Signed)
Report given to on coming RN Ophelia Charter who will resume care. Pt stable will monitor. Jeanie Sewer, RN 12:27 AM 08/18/2014

## 2014-08-18 NOTE — Progress Notes (Signed)
Physical Therapy Session Note  Patient Details  Name: Tony Davis MRN: [id number] Date of Birth: [birth year]  Today's Date: 08/18/2014 PT Individual Time: 0900-1000 PT Individual Time Calculation (min): 60 min   Short Term Goals: Week 3:  PT Short Term Goal 1 (Week 3): Pt will demonstrated focused attention to therapeutic task for 30 seconds with max multimodal cues PT Short Term Goal 2 (Week 3): Pt to perform bed mobility with mod Ax1person, 50% of time PT Short Term Goal 3 (Week 3): Pt to perform bed<>w/c transfers with max Ax1 person PT Short Term Goal 4 (Week 3): Pt to demonstrate standing balance with max Ax1person, 50% of time PT Short Term Goal 5 (Week 3): Pt to ambulate 100' with mod Ax1 person  Skilled Therapeutic Interventions/Progress Updates:    Pt received seated in w/c, agreeable to participate in therapy. Transported pt to hallway and pt propelled w/c w/ ModA 54' w/ a lot of extra time, intermittent HOH assist for avoiding obstacles on R, moving chair back to middle of hallway when caught on R wall. Noted pt w/ improved mood and engagement when approaching nurse's station and initiated asking to see other pt that frequently sits at nurse's station. Pt transported remaining distance to rehab gym. Attempted to initiate sit>stand w/ EVA walker, however pt unable to follow directions enough to stand w/ walker, despite +2 Assist. Pt then able to stand w/ [name] walker w/ walking sling, and ambulated 50' w/ ModA w/ +2 A for propelling Sara Plus walker. Noted shuffling gait and decreased step length bilaterally, unable to initiate stepping but stepped when [name] pulled forward. Pt then w/ standing w/ [name] walker for ~5 minutes w/ cues to fix visual gaze. Noted difficulty attending to task in busy gym environment. Both times pt asked to identify color of object he answered "blue" (incorrect), then after ~30 seconds asked again and correctly identified. Pt transported to nurse's  station, left seated at nurse's station w/ QRB on.  Therapy Documentation Precautions:  Precautions Precautions: Fall;Other (comment) Precaution Comments: PEG/abdominal precautions, trach, Bilateral thigh high TED hose  Restrictions Weight Bearing Restrictions: No General:   Vital Signs: Therapy Vitals Temp: 98.3 F (36.8 C) Temp src: Oral Pulse Rate: 81 Resp: 18 BP: 122/87 mmHg Patient Position (if appropriate): Lying Oxygen Therapy SpO2: 91 % O2 Device: None (Room air) Pain: Pain Assessment Pain Assessment: No/denies pain Mobility:   Locomotion :    Trunk/Postural Assessment :    Balance:   Exercises:   Other Treatments:    See FIM for current functional status  Therapy/Group: Individual Therapy  Rada Hay Rada Hay, PT, DPT 08/18/2014, 7:52 AM

## 2014-08-18 NOTE — Progress Notes (Signed)
Occupational Therapy Session Note  Patient Details  Name: LEONIDAS BOATENG MRN: [id number] Date of Birth: 1961-12-09  Today's Date: 08/18/2014 OT Individual Time: 1100-1200 OT Individual Time Calculation (min): 60 min    Short Term Goals: Week 1:  OT Short Term Goal 1 (Week 1): Pt will perform dynamic sitting balance seated on EOB with Max A  in order to increase balance for ADLs. OT Short Term Goal 1 - Progress (Week 1): Met OT Short Term Goal 2 (Week 1): Pt will perform grooming with Max A in order to increase ADL performance. OT Short Term Goal 2 - Progress (Week 1): Met OT Short Term Goal 3 (Week 1): Pt will perform UB dressing with Max A in order to increase ADL performance. OT Short Term Goal 3 - Progress (Week 1): Met OT Short Term Goal 4 (Week 1): Pt will  engage in 5 minutes of functional activity before taking a rest break to increase endurance.  OT Short Term Goal 4 - Progress (Week 1): Progressing toward goal Week 2:  OT Short Term Goal 1 (Week 2): Pt will maintain sitting balance EOB for 5 mins with min A while performing functional tasks OT Short Term Goal 1 - Progress (Week 2): Met OT Short Term Goal 2 (Week 2): Pt will perform UB dressing with mod A while seated OT Short Term Goal 2 - Progress (Week 2): Met OT Short Term Goal 3 (Week 2): Pt will perform LB dressing with max A OT Short Term Goal 3 - Progress (Week 2): Met OT Short Term Goal 4 (Week 2): Pt will engage in 5 mins of functional activity before taking a rest break to increase endurance OT Short Term Goal 4 - Progress (Week 2): Met OT Short Term Goal 5 (Week 2): Pt will sustain attention for 60 seconds with min verbal cues during funcitonal tasks OT Short Term Goal 5 - Progress (Week 2): Met Week 3:  OT Short Term Goal 1 (Week 3): PT will demonstrate sustained attention to functional task for 3 min with mod cuing OT Short Term Goal 2 (Week 3): Pt will perform LB dressing wtih mod A  OT Short Term Goal 3 (Week  3): Pt will self feed 50% of a meal with mod cuing OT Short Term Goal 4 (Week 3): Pt will perform shower transfer with max A  OT Short Term Goal 5 (Week 3): Pt will bathe with mod A at shower level   Skilled Therapeutic Interventions/Progress Updates:    1:1 self care retraining at shower level with focus on stand pivot transfers in and out of zero entry shower to a 3:1 chair, initiation of familiar one step task, sit to stand, standing balance without UE support, following one step command with mod-max cues, functional problem solving with mod - max A, orientation with total A with environmental cues with calendar. Pt able to perform transfer with min A with +2 present for safety. Pt with increased initiation for sit to stands for peri care in shower situation compared to sit. Pt more vocal today, demonstrating increased language of confusion. Pt left with NT to eat lunch.   Therapy Documentation Precautions:  Precautions Precautions: Fall;Other (comment) Precaution Comments: PEG/abdominal precautions, trach, Bilateral thigh high TED hose  Restrictions Weight Bearing Restrictions: No Pain: Pain Assessment Pain Assessment: No/denies pain See FIM for current functional status  Therapy/Group: Individual Therapy  Willeen Cass [facility] 08/18/2014, 1:48 PM

## 2014-08-18 NOTE — Progress Notes (Signed)
Speech Language Pathology Daily Session Note  Patient Details  Name: Tony Davis MRN: [id number] Date of Birth: [birth year]  Today's Date: 08/18/2014 SLP Individual Time: 0800-0900 SLP Individual Time Calculation (min): 60 min  Short Term Goals: Week 3: SLP Short Term Goal 1 (Week 3): Patient will sustain attention to basic self-care task for ~2 minutes with Max multimodal cues.  SLP Short Term Goal 2 (Week 3): Patient will demonstrate use of call bell with Max multimodal cues. SLP Short Term Goal 3 (Week 3): Patient will follow basic, 1-step directions during self-care tasks with Mod multimodal cues. SLP Short Term Goal 4 (Week 3): Patient will initiate verbal response to yes/no questions with Mod multimodal cues. SLP Short Term Goal 5 (Week 3): Patient will utilize swallowing compensatory strategies with current diet to minimize overt s/s of aspiration with Min A multimodal cues.  SLP Short Term Goal 6 (Week 3): Patient will initiate functional tasks with Mod A multimodal cues.   Skilled Therapeutic Interventions: Skilled treatment session focused on cognitive-linguistic and dysphagia goals. Student facilitated session by initially providing Total A which faded to overall Max A for initiation and attention to self-feeding during breakfast meal of Dys. 1 textures with thin liquids. Patient did not demonstrate any overt s/s of aspiration but required Mod A multimodal cues for small bites. Patient with minimal verbal responses to yes/no questions; however patient utilized vocalizations such as "uh huh, etc." and gestures such as nodding head for responses. Patient also demonstrated intermittent language of confusion throughout the session. Continue with current plan of care.   FIM:  Comprehension Comprehension Mode: Auditory Comprehension: 3-Understands basic 50 - 74% of the time/requires cueing 25 - 50%  of the time Expression Expression Mode: Verbal Expression: 2-Expresses basic 25 - 49%  of the time/requires cueing 50 - 75% of the time. Uses single words/gestures. Social Interaction Social Interaction: 2-Interacts appropriately 25 - 49% of time - Needs frequent redirection. Problem Solving Problem Solving: 1-Solves basic less than 25% of the time - needs direction nearly all the time or does not effectively solve problems and may need a restraint for safety Memory Memory: 1-Recognizes or recalls less than 25% of the time/requires cueing greater than 75% of the time FIM - Eating Eating Activity: 4: Help with managing cup/glass;4: Help with picking up utensils;4: Helper occasionally brings food to mouth;4: Helper occasionally scoops food on utensil;5: Needs verbal cues/supervision  Pain Pain Assessment Pain Assessment: No/denies pain  Therapy/Group: Individual Therapy  Yovani Cogburn 08/18/2014, 11:07 AM

## 2014-08-18 NOTE — Progress Notes (Signed)
Subjective/Complaints:   In no distress. Denies sob, cough, cp.  Review of Systems -remains limited due to cognitive status   Objective: Vital Signs: Blood pressure 122/87, pulse 81, temperature 98.3 F (36.8 C), temperature source Oral, resp. rate 18, weight 112 kg (246 lb 14.6 oz), SpO2 91.00%. No results found. Results for orders placed during the hospital encounter of 07/30/14 (from the past 72 hour(s))  GLUCOSE, CAPILLARY     Status: Abnormal   Collection Time    08/15/14 11:47 AM      Result Value Ref Range   Glucose-Capillary 157 (*) 70 - 99 mg/dL  GLUCOSE, CAPILLARY     Status: Abnormal   Collection Time    08/15/14  1:26 PM      Result Value Ref Range   Glucose-Capillary 140 (*) 70 - 99 mg/dL  GLUCOSE, CAPILLARY     Status: Abnormal   Collection Time    08/15/14  8:12 PM      Result Value Ref Range   Glucose-Capillary 102 (*) 70 - 99 mg/dL   Comment 1 Notify RN    BASIC METABOLIC PANEL     Status: Abnormal   Collection Time    08/16/14  5:45 AM      Result Value Ref Range   Sodium 144  137 - 147 mEq/L   Potassium 3.3 (*) 3.7 - 5.3 mEq/L   Chloride 100  96 - 112 mEq/L   CO2 31  19 - 32 mEq/L   Glucose, Bld 102 (*) 70 - 99 mg/dL   BUN 11  6 - 23 mg/dL   Creatinine, Ser 0.73  0.50 - 1.35 mg/dL   Calcium 9.5  8.4 - 10.5 mg/dL   GFR calc non Af Amer >90  >90 mL/min   GFR calc Af Amer >90  >90 mL/min   Comment: (NOTE)     The eGFR has been calculated using the CKD EPI equation.     This calculation has not been validated in all clinical situations.     eGFR's persistently <90 mL/min signify possible Chronic Kidney     Disease.   Anion gap 13  5 - 15  GLUCOSE, CAPILLARY     Status: Abnormal   Collection Time    08/16/14  6:56 AM      Result Value Ref Range   Glucose-Capillary 100 (*) 70 - 99 mg/dL   Comment 1 Notify RN    GLUCOSE, CAPILLARY     Status: Abnormal   Collection Time    08/16/14  1:52 PM      Result Value Ref Range   Glucose-Capillary 131 (*)  70 - 99 mg/dL   Comment 1 Notify RN    GLUCOSE, CAPILLARY     Status: Abnormal   Collection Time    08/16/14  4:51 PM      Result Value Ref Range   Glucose-Capillary 146 (*) 70 - 99 mg/dL  GLUCOSE, CAPILLARY     Status: Abnormal   Collection Time    08/16/14  7:59 PM      Result Value Ref Range   Glucose-Capillary 111 (*) 70 - 99 mg/dL  GLUCOSE, CAPILLARY     Status: Abnormal   Collection Time    08/17/14 12:45 AM      Result Value Ref Range   Glucose-Capillary 168 (*) 70 - 99 mg/dL  GLUCOSE, CAPILLARY     Status: Abnormal   Collection Time    08/17/14  3:50 AM  Result Value Ref Range   Glucose-Capillary 140 (*) 70 - 99 mg/dL  GLUCOSE, CAPILLARY     Status: Abnormal   Collection Time    08/17/14  7:30 AM      Result Value Ref Range   Glucose-Capillary 102 (*) 70 - 99 mg/dL   Comment 1 Notify RN    GLUCOSE, CAPILLARY     Status: Abnormal   Collection Time    08/17/14 12:11 PM      Result Value Ref Range   Glucose-Capillary 187 (*) 70 - 99 mg/dL   Comment 1 Notify RN    GLUCOSE, CAPILLARY     Status: Abnormal   Collection Time    08/17/14  4:45 PM      Result Value Ref Range   Glucose-Capillary 101 (*) 70 - 99 mg/dL  GLUCOSE, CAPILLARY     Status: Abnormal   Collection Time    08/17/14  9:24 PM      Result Value Ref Range   Glucose-Capillary 160 (*) 70 - 99 mg/dL     HEENT: neck clean. Trach site healed/closed Cardio: RRR and no murmur Resp: CTA B/L and unlabored, clear.  GI: BS positive and PEG site clean. Extremity:  Bilateral pedal edema trace to 1+--improved Skin:   Intact other than above Neuro:   Initiates more. Better attention/awareness.  Abnormal Sensory reduced on left difficult to assess secondary to MS, Abnormal Motor 4- LUE, 3- to 3+ RLE, 2- to 3 LLE, 5/5 RUE grossly ---inconsistent and needs cues to activate all 4 limbs Musc/Skel:  Normal GEN NAD   Assessment/Plan: 1. Functional deficits secondary to left Thalamic ICH, Hydrocephalus with Right   shunt which require 3+ hours per day of interdisciplinary therapy in a comprehensive inpatient rehab setting. Physiatrist is providing close team supervision and 24 hour management of active medical problems listed below. Physiatrist and rehab team continue to assess barriers to discharge/monitor patient progress toward functional and medical goals.  FIM: FIM - Bathing Bathing Steps Patient Completed: Chest;Right Arm;Left Arm;Abdomen;Right upper leg;Left upper leg Bathing: 3: Mod-Patient completes 5-7 74f10 parts or 50-74%  FIM - Upper Body Dressing/Undressing Upper body dressing/undressing steps patient completed: Thread/unthread right sleeve of pullover shirt/dresss;Thread/unthread left sleeve of pullover shirt/dress;Put head through opening of pull over shirt/dress;Pull shirt over trunk Upper body dressing/undressing: 4: Min-Patient completed 75 plus % of tasks FIM - Lower Body Dressing/Undressing Lower body dressing/undressing steps patient completed: Thread/unthread right underwear leg;Thread/unthread left underwear leg;Thread/unthread right pants leg;Thread/unthread left pants leg Lower body dressing/undressing: 2: Max-Patient completed 25-49% of tasks  FIM - Toileting Toileting steps completed by patient: Adjust clothing prior to toileting Toileting Assistive Devices: Grab bar or rail for support Toileting: 1: Two helpers  FIM - TRadio producerDevices: BRecruitment consultantTransfers: 1-Two helpers (extra time needed for patient to process)  FIM - BControl and instrumentation engineerDevices: Bed rails Bed/Chair Transfer: 3: Sit > Supine: Mod A (lifting assist/Pt. 50-74%/lift 2 legs);3: Chair or W/C > Bed: Mod A (lift or lower assist)  FIM - Locomotion: Wheelchair Locomotion: Wheelchair: 2: Travels 50 - 149 ft with minimal assistance (Pt.>75%) FIM - Locomotion: Ambulation Locomotion: Ambulation Assistive Devices: WAstronomerAmbulation/Gait Assistance: 1: +2 Total assist (modAx1 person with w/c follow) Locomotion: Ambulation: 0: Activity did not occur  Comprehension Comprehension Mode: Auditory Comprehension: 3-Understands basic 50 - 74% of the time/requires cueing 25 - 50%  of the time  Expression Expression Mode: Verbal Expression Assistive  Devices: 6-Talk trach valve Expression: 2-Expresses basic 25 - 49% of the time/requires cueing 50 - 75% of the time. Uses single words/gestures.  Social Interaction Social Interaction Mode: Asleep Social Interaction: 2-Interacts appropriately 25 - 49% of time - Needs frequent redirection.  Problem Solving Problem Solving Mode: Asleep Problem Solving: 1-Solves basic less than 25% of the time - needs direction nearly all the time or does not effectively solve problems and may need a restraint for safety  Memory Memory Mode: Asleep Memory: 1-Recognizes or recalls less than 25% of the time/requires cueing greater than 75% of the time  Medical Problem List and Plan:  1. Functional deficits secondary to Left thalamic bleed with hydrocephalus.  2. DVT Prophylaxis/Anticoagulation/has IVCF: Pharmaceutical: Lovenox-  -TEDs for edema, elevation  -dopplers + (has hx)  3. Chronic B-knee/Pain Management: Prn oxycodone.   Voltaren gel for knees.  -poorly-compliant pain patient in my practice 4. Mood: Monitor as mentation improves.  5. Neuropsych: This patient is not capable of making decisions on his own behalf.   -continue low dose ritalin---will not push higher given CV history 6. Skin/Wound Care:   Continue air mattress overlay.  .  7. DM type 2: Will monitor BS with ac/hs checks. Continue Levemir bid and adjust as indicated.  -reasonable control still  8. Enterobacter tracheobronchitis: abx completed  -decannulated   7. HTN/CAD:  -metoprolol bid  -added HCTZ to assist with edema as well as bp---has helped  -increased norvasc to 50m 8. ABLA: on iron  supplement.   9. Dysphagia: on d1 thins, ate well yesterday----dc TF and observe   LOS (Days) 19 A FACE TO FACE EVALUATION WAS PERFORMED  SWARTZ,Tony Davis 08/18/2014, 7:46 AM

## 2014-08-18 NOTE — Progress Notes (Signed)
Calorie Count Note  48 hour calorie count ordered.  Diet: Dysphagia I with Thin Liquids Supplements: NA  9/9 Breakfast: 436 kcal and 14 grams of protein 9/9 Lunch: 430 kcal and 27 grams of protein 9/8 Dinner: 618 kcal and 30 grams of protein Supplements: NA  Total intake: 1484 kcal (74% of minimum estimated needs)  71 protein (71% of minimum estimated needs)  Per RN pt has ate well since diet advanced. Pt does not initiate feedings but will eat if being fed. Dysphagia 1 diet limited in calories/protein. Will offer magic cup as he is eating these when offered on his trays and ensure. Plan discussed with RN.   Nutrition Dx: Inadequate oral intake now related to dysphagia and cognition as evidenced by dysphagia 1 diet; ongoing.   Goal: Pt to meet >/= 90% of their estimated nutrition needs   Intervention:  Magic cup TID between meals, each supplement provides 290 kcal and 9 grams of protein  Ensure Complete po BID, each supplement provides 350 kcal and 13 grams of protein   Maylon Peppers RD, LDN, [place] Pager [phone] After Hours Pager

## 2014-08-19 ENCOUNTER — Inpatient Hospital Stay (HOSPITAL_COMMUNITY): Payer: Medicare Other | Admitting: Occupational Therapy

## 2014-08-19 ENCOUNTER — Inpatient Hospital Stay (HOSPITAL_COMMUNITY): Payer: Medicare Other | Admitting: Speech Pathology

## 2014-08-19 ENCOUNTER — Inpatient Hospital Stay (HOSPITAL_COMMUNITY): Payer: Medicare Other

## 2014-08-19 LAB — GLUCOSE, CAPILLARY
GLUCOSE-CAPILLARY: 105 mg/dL — AB (ref 70–99)
GLUCOSE-CAPILLARY: 132 mg/dL — AB (ref 70–99)
Glucose-Capillary: 125 mg/dL — ABNORMAL HIGH (ref 70–99)
Glucose-Capillary: 89 mg/dL (ref 70–99)

## 2014-08-19 MED ORDER — INSULIN DETEMIR 100 UNIT/ML ~~LOC~~ SOLN
20.0000 [IU] | Freq: Two times a day (BID) | SUBCUTANEOUS | Status: DC
  Administered 2014-08-19 – 2014-09-07 (×39): 20 [IU] via SUBCUTANEOUS
  Filled 2014-08-19 (×43): qty 0.2

## 2014-08-19 MED ORDER — ENSURE COMPLETE PO LIQD
237.0000 mL | Freq: Two times a day (BID) | ORAL | Status: DC
  Administered 2014-08-19 – 2014-09-06 (×30): 237 mL via ORAL

## 2014-08-19 NOTE — Progress Notes (Signed)
Speech Language Pathology Daily Session Note  Patient Details  Name: Tony Davis MRN: [id number] Date of Birth: 1961-08-02  Today's Date: 08/19/2014 SLP Individual Time: 0800-0900 SLP Individual Time Calculation (min): 60 min  Short Term Goals: Week 3: SLP Short Term Goal 1 (Week 3): Patient will sustain attention to basic self-care task for ~2 minutes with Max multimodal cues.  SLP Short Term Goal 2 (Week 3): Patient will demonstrate use of call bell with Max multimodal cues. SLP Short Term Goal 3 (Week 3): Patient will follow basic, 1-step directions during self-care tasks with Mod multimodal cues. SLP Short Term Goal 4 (Week 3): Patient will initiate verbal response to yes/no questions with Mod multimodal cues. SLP Short Term Goal 5 (Week 3): Patient will utilize swallowing compensatory strategies with current diet to minimize overt s/s of aspiration with Min A multimodal cues.  SLP Short Term Goal 6 (Week 3): Patient will initiate functional tasks with Mod A multimodal cues.   Skilled Therapeutic Interventions: Skilled treatment session focused on cognitive-linguistic and dysphagia goals. Student facilitated session by providing overall Mod A multimodal cues for initiation and attention to self-feeding during a breakfast meal of Dys. 1 textures with thin liquids in a mildly distracting environment. Patient did not demonstrate any overt s/s of aspiration but required Min A multimodal cues for small bites. Patient followed 75-90% of commands with extra time and utilized verbal expression for responses instead of gestures with Min A verbal cues to increase vocal intensity for improved speech intelligibility. Patient demonstrated verbal perseveration once during the session. Continue with current plan of care.    FIM:  Comprehension Comprehension Mode: Auditory Comprehension: 4-Understands basic 75 - 89% of the time/requires cueing 10 - 24% of the time Expression Expression Mode:  Verbal Expression: 3-Expresses basic 50 - 74% of the time/requires cueing 25 - 50% of the time. Needs to repeat parts of sentences. Social Interaction Social Interaction: 2-Interacts appropriately 25 - 49% of time - Needs frequent redirection. Problem Solving Problem Solving: 1-Solves basic less than 25% of the time - needs direction nearly all the time or does not effectively solve problems and may need a restraint for safety Memory Memory: 2-Recognizes or recalls 25 - 49% of the time/requires cueing 51 - 75% of the time FIM - Eating Eating Activity: 4: Help with managing cup/glass;4: Helper occasionally scoops food on utensil;4: Helper occasionally brings food to mouth;5: Needs verbal cues/supervision  Pain Pain Assessment Pain Assessment: No/denies pain  Therapy/Group: Individual Therapy  Gevin Perea 08/19/2014, 1:09 PM

## 2014-08-19 NOTE — Patient Care Conference (Signed)
Inpatient RehabilitationTeam Conference and Plan of Care Update Date: 08/17/2014   Time: 2:50 PM    Patient Name: Tony Davis      Medical Record Number: [id number]  Date of Birth: 11-22-1961 Sex: Male         Room/Bed: 4W05C/4W05C-01 Payor Info: Payor: MEDICARE / Plan: MEDICARE PART A AND B / Product Type: *No Product type* /    Admitting Diagnosis: DECOND  S P L BG   Admit Date/Time:  07/30/2014  8:40 PM Admission Comments: No comment available   Primary Diagnosis:  <principal problem not specified> Principal Problem: <principal problem not specified>  Patient Active Problem List   Diagnosis Date Noted  . Basal ganglia hemorrhage 07/30/2014  . Syncope due to orthostatic hypotension 04/15/2014  . Medication adverse effect 04/15/2014  . Syncope 04/15/2014  . Routine general medical examination at a health care facility 02/11/2014  . BPH associated with nocturia 02/11/2014  . Gynecomastia 02/11/2014  . Osteoarthritis of left knee 08/21/2013  . Osteoarthritis of right knee 08/21/2013  . Insomnia, persistent 03/25/2013  . History of tobacco use 03/25/2013  . Chronic rhinitis 03/25/2013  . Hemiparesis due to old cerebral infarction 03/20/2013  . DJD (degenerative joint disease) of knee 10/13/2012  . Folate deficiency anemia 10/13/2012  . Long term (current) use of anticoagulants 03/28/2012  . DVT (deep venous thrombosis) 03/19/2012  . Obstructive sleep apnea 08/16/2011  . Type II or unspecified type diabetes mellitus without mention of complication, uncontrolled 08/06/2011  . PATENT FORAMEN OVALE 03/13/2010  . CVA 02/08/2010  . CAD, NATIVE VESSEL 01/26/2010  . Hyperlipidemia LDL goal < 100 01/23/2010  . Essential hypertension, benign 01/23/2010  . GERD 01/23/2010    Expected Discharge Date: Expected Discharge Date: 09/02/14  Team Members Present: Physician leading conference: Dr. Alger Simons Social Worker Present: Lennart Pall, LCSW Nurse Present: Elliot Cousin,  RN PT Present: Melene Plan, Cottie Banda, PT OT Present: Willeen Cass, Roland Earl, OT SLP Present: Weston Anna, SLP PPS Coordinator present : Daiva Nakayama, RN, CRRN     Current Status/Progress Goal Weekly Team Focus  Medical   improved attention, arousal. still slow to initiate  improve attention  pain control, begin diet   Bowel/Bladder   Incontinent b/b, LBM 9/7, condom cath HS, no initiation with staff to void  Manage bowel and bladder with mod assist.  offer toileting q 2h and PRN   Swallow/Nutrition/ Hydration   Dys. 1 textures with thin liquids, Max A  Mod A  increase PO intake, increase utilization of swallowing strategies, trials upgraded textures   ADL's   UB dressing - min A ; LB dressing mod A, mod A with extra time for sit to stands, decr intiation and activity tolerance, +2 for transfers for safety  mod A overall, min A UB dressing  attention, orientation, transfers, sitting balance, bed mobility   Mobility   Mod Ax1person to Ax2 persons overall dependent upon alertness arousal, attention, initiation, ability to follow simple commands; req mod-total cueing overall  Supervision for sitting balance; Min A for cognition and w/c propulsion; Mod A for bed mobility, transfers, balance, ambulation, stair negotiation.   activity tolerance, bed mobility, transfers, balance, standing balance, attention, initiation, orientation   Communication   Max A  Mod A  initiation of verbal responses   Safety/Cognition/ Behavioral Observations  Max-Total A  mod assist  attention, initaition, problem solving, orientation    Pain   denies pain, no evidence of pain  Skin   Air overlay mattress, excoriation to scrotum - applying barrier cream PRN  no new skin breakdown or infection during rehab stay with mod assist.  Assess skin q shirt and PRN, turn q 2 hours    Rehab Goals Patient on target to meet rehab goals: Yes *See Care Plan and progress notes for long and  short-term goals.  Barriers to Discharge: see prior, pain     Possible Resolutions to Barriers:  rx pain, distraction    Discharge Planning/Teaching Needs:  wife still plans to have pt d/c home with her and other family members providing 24/7 assistance      Team Discussion:  Improved attention.   Started on D1 diet with thin but actually completing a meal may be a barrier.  Moderate assist to initiate at times.  Still incont b/b and does not appear to have any awareness.  Timed toileting not successful so far.  On track for minimal assist w/c  Level.  Good session with wife on Monday, however, she does not verbally offer much opinion about how she feels he is progressing.  SW to follow up.  Revisions to Treatment Plan:  None   Continued Need for Acute Rehabilitation Level of Care: The patient requires daily medical management by a physician with specialized training in physical medicine and rehabilitation for the following conditions: Daily direction of a multidisciplinary physical rehabilitation program to ensure safe treatment while eliciting the highest outcome that is of practical value to the patient.: Yes Daily medical management of patient stability for increased activity during participation in an intensive rehabilitation regime.: Yes Daily analysis of laboratory values and/or radiology reports with any subsequent need for medication adjustment of medical intervention for : Post surgical problems;Neurological problems  Ayyan Sites 08/19/2014, 10:49 AM

## 2014-08-19 NOTE — Progress Notes (Signed)
Subjective/Complaints:   No new issues. Just waking up.   Review of Systems -remains limited due to cognitive status   Objective: Vital Signs: Blood pressure 146/78, pulse 80, temperature 98 F (36.7 C), temperature source Oral, resp. rate 18, weight 112 kg (246 lb 14.6 oz), SpO2 94.00%. No results found. Results for orders placed during the hospital encounter of 07/30/14 (from the past 72 hour(s))  GLUCOSE, CAPILLARY     Status: Abnormal   Collection Time    08/16/14  1:52 PM      Result Value Ref Range   Glucose-Capillary 131 (*) 70 - 99 mg/dL   Comment 1 Notify RN    GLUCOSE, CAPILLARY     Status: Abnormal   Collection Time    08/16/14  4:51 PM      Result Value Ref Range   Glucose-Capillary 146 (*) 70 - 99 mg/dL  GLUCOSE, CAPILLARY     Status: Abnormal   Collection Time    08/16/14  7:59 PM      Result Value Ref Range   Glucose-Capillary 111 (*) 70 - 99 mg/dL  GLUCOSE, CAPILLARY     Status: Abnormal   Collection Time    08/17/14 12:45 AM      Result Value Ref Range   Glucose-Capillary 168 (*) 70 - 99 mg/dL  GLUCOSE, CAPILLARY     Status: Abnormal   Collection Time    08/17/14  3:50 AM      Result Value Ref Range   Glucose-Capillary 140 (*) 70 - 99 mg/dL  GLUCOSE, CAPILLARY     Status: Abnormal   Collection Time    08/17/14  7:30 AM      Result Value Ref Range   Glucose-Capillary 102 (*) 70 - 99 mg/dL   Comment 1 Notify RN    GLUCOSE, CAPILLARY     Status: Abnormal   Collection Time    08/17/14 12:11 PM      Result Value Ref Range   Glucose-Capillary 187 (*) 70 - 99 mg/dL   Comment 1 Notify RN    GLUCOSE, CAPILLARY     Status: Abnormal   Collection Time    08/17/14  4:45 PM      Result Value Ref Range   Glucose-Capillary 101 (*) 70 - 99 mg/dL  GLUCOSE, CAPILLARY     Status: Abnormal   Collection Time    08/17/14  9:24 PM      Result Value Ref Range   Glucose-Capillary 160 (*) 70 - 99 mg/dL  GLUCOSE, CAPILLARY     Status: Abnormal   Collection Time   08/18/14  8:05 AM      Result Value Ref Range   Glucose-Capillary 118 (*) 70 - 99 mg/dL  GLUCOSE, CAPILLARY     Status: Abnormal   Collection Time    08/18/14 11:57 AM      Result Value Ref Range   Glucose-Capillary 105 (*) 70 - 99 mg/dL   Comment 1 Notify RN    GLUCOSE, CAPILLARY     Status: None   Collection Time    08/18/14  4:57 PM      Result Value Ref Range   Glucose-Capillary 92  70 - 99 mg/dL  GLUCOSE, CAPILLARY     Status: Abnormal   Collection Time    08/18/14  9:11 PM      Result Value Ref Range   Glucose-Capillary 144 (*) 70 - 99 mg/dL  GLUCOSE, CAPILLARY     Status: Abnormal  Collection Time    08/19/14  5:56 AM      Result Value Ref Range   Glucose-Capillary 105 (*) 70 - 99 mg/dL     HEENT: neck clean. Trach site closed Cardio: RRR and no murmur Resp: CTA B/L and unlabored, clear.  GI: BS positive and PEG site clean. Extremity:  Bilateral pedal edema trace to 1+--improved Skin:   Intact other than above Neuro:   Initiates more. Better attention/awareness.  Abnormal Sensory reduced on left difficult to assess secondary to MS, Abnormal Motor 4 LUE, 3 to 3+ RLE, 2 to 3 LLE, 5/5 RUE grossly --quicker activtation Musc/Skel:  Normal GEN NAD   Assessment/Plan: 1. Functional deficits secondary to left Thalamic ICH, Hydrocephalus with Right  shunt which require 3+ hours per day of interdisciplinary therapy in a comprehensive inpatient rehab setting. Physiatrist is providing close team supervision and 24 hour management of active medical problems listed below. Physiatrist and rehab team continue to assess barriers to discharge/monitor patient progress toward functional and medical goals.  FIM: FIM - Bathing Bathing Steps Patient Completed: Chest;Right Arm;Left Arm;Abdomen;Front perineal area;Right upper leg;Left upper leg Bathing: 3: Mod-Patient completes 5-7 61f 10 parts or 50-74%  FIM - Upper Body Dressing/Undressing Upper body dressing/undressing steps patient  completed: Thread/unthread right sleeve of pullover shirt/dresss;Thread/unthread left sleeve of pullover shirt/dress;Put head through opening of pull over shirt/dress;Pull shirt over trunk Upper body dressing/undressing: 4: Min-Patient completed 75 plus % of tasks FIM - Lower Body Dressing/Undressing Lower body dressing/undressing steps patient completed: Thread/unthread right pants leg;Thread/unthread left pants leg;Pull pants up/down Lower body dressing/undressing: 3: Mod-Patient completed 50-74% of tasks  FIM - Toileting Toileting steps completed by patient: Adjust clothing prior to toileting Toileting Assistive Devices: Grab bar or rail for support Toileting: 1: Two helpers  FIM - Radio producer Devices: Recruitment consultant Transfers: 4-To toilet/BSC: Min A (steadying Pt. > 75%);1-Two helpers  FIM - Control and instrumentation engineer Devices: Bed rails Bed/Chair Transfer: 0: Activity did not occur  FIM - Locomotion: Wheelchair Locomotion: Wheelchair: 2: Travels 50 - 149 ft with moderate assistance (Pt: 50 - 74%) FIM - Locomotion: Ambulation Locomotion: Ambulation Assistive Devices: Sara Plus Ambulation/Gait Assistance: 1: +2 Total assist Locomotion: Ambulation: 2: Travels 50 - 149 ft with maximal assistance (Pt: 25 - 49%)  Comprehension Comprehension Mode: Auditory Comprehension: 2-Understands basic 25 - 49% of the time/requires cueing 51 - 75% of the time  Expression Expression Mode: Verbal Expression Assistive Devices: 6-Talk trach valve Expression: 2-Expresses basic 25 - 49% of the time/requires cueing 50 - 75% of the time. Uses single words/gestures.  Social Interaction Social Interaction Mode: Asleep Social Interaction: 2-Interacts appropriately 25 - 49% of time - Needs frequent redirection.  Problem Solving Problem Solving Mode: Asleep Problem Solving: 1-Solves basic less than 25% of the time - needs direction nearly all  the time or does not effectively solve problems and may need a restraint for safety  Memory Memory Mode: Asleep Memory: 1-Recognizes or recalls less than 25% of the time/requires cueing greater than 75% of the time  Medical Problem List and Plan:  1. Functional deficits secondary to Left thalamic bleed with hydrocephalus.  2. DVT Prophylaxis/Anticoagulation/has IVCF: Pharmaceutical: Lovenox-  -TEDs for edema, elevation  -dopplers + (has hx)  3. Chronic B-knee/Pain Management: Prn oxycodone.   Voltaren gel for knees.  -poorly-compliant pain patient in my practice 4. Mood: Monitor as mentation improves.  5. Neuropsych: This patient is not capable of making decisions on his  own behalf.   -continue low dose ritalin---will not push higher given CV history 6. Skin/Wound Care:   Continue air mattress overlay.   7. DM type 2: Will monitor BS with ac/hs checks. Continue Levemir bid and adjust as indicated.  -reasonable control still  8. Enterobacter tracheobronchitis: abx completed  -decannulated   7. HTN/CAD:  -metoprolol bid  -added HCTZ to assist with edema as well as bp---has helped  -increased norvasc to 10mg  8. ABLA: on iron supplement.   9. Dysphagia: on d1 thins, eating well  -off TF   LOS (Days) 20 A FACE TO FACE EVALUATION WAS PERFORMED  SWARTZ,ZACHARY T 08/19/2014, 8:14 AM

## 2014-08-19 NOTE — Progress Notes (Signed)
Social Work Patient ID: Tony Davis, male   DOB: 07/14/1961, 53 y.o.   MRN: [id number]  Tony Pall, LCSW Social Worker Signed  Patient Care Conference Service date: 08/19/2014 10:48 AM  Inpatient RehabilitationTeam Conference and Plan of Care Update Date: 08/17/2014   Time: 2:50 PM     Patient Name: Tony Davis       Medical Record Number: [id number]   Date of Birth: Jul 17, 1961 Sex: Male         Room/Bed: 4W05C/4W05C-01 Payor Info: Payor: MEDICARE / Plan: MEDICARE PART A AND B / Product Type: *No Product type* /   Admitting Diagnosis: DECOND  S P L BG   Admit Date/Time:  07/30/2014  8:40 PM Admission Comments: No comment available   Primary Diagnosis:  <principal problem not specified> Principal Problem: <principal problem not specified>    Patient Active Problem List     Diagnosis  Date Noted   .  Basal ganglia hemorrhage  07/30/2014   .  Syncope due to orthostatic hypotension  04/15/2014   .  Medication adverse effect  04/15/2014   .  Syncope  04/15/2014   .  Routine general medical examination at a health care facility  02/11/2014   .  BPH associated with nocturia  02/11/2014   .  Gynecomastia  02/11/2014   .  Osteoarthritis of left knee  08/21/2013   .  Osteoarthritis of right knee  08/21/2013   .  Insomnia, persistent  03/25/2013   .  History of tobacco use  03/25/2013   .  Chronic rhinitis  03/25/2013   .  Hemiparesis due to old cerebral infarction  03/20/2013   .  DJD (degenerative joint disease) of knee  10/13/2012   .  Folate deficiency anemia  10/13/2012   .  Long term (current) use of anticoagulants  03/28/2012   .  DVT (deep venous thrombosis)  03/19/2012   .  Obstructive sleep apnea  08/16/2011   .  Type II or unspecified type diabetes mellitus without mention of complication, uncontrolled  08/06/2011   .  PATENT FORAMEN OVALE  03/13/2010   .  CVA  02/08/2010   .  CAD, NATIVE VESSEL  01/26/2010   .  Hyperlipidemia LDL goal < 100  01/23/2010   .   Essential hypertension, benign  01/23/2010   .  GERD  01/23/2010     Expected Discharge Date: Expected Discharge Date: 09/02/14  Team Members Present: Physician leading conference: Dr. Alger Simons Social Worker Present: Tony Pall, LCSW Nurse Present: Elliot Cousin, RN PT Present: Melene Plan, Cottie Banda, PT OT Present: Willeen Cass, Roland Earl, OT SLP Present: Weston Anna, SLP PPS Coordinator present : Daiva Nakayama, RN, CRRN        Current Status/Progress  Goal  Weekly Team Focus   Medical     improved attention, arousal. still slow to initiate  improve attention  pain control, begin diet   Bowel/Bladder     Incontinent b/b, LBM 9/7, condom cath HS, no initiation with staff to void  Manage bowel and bladder with mod assist.  offer toileting q 2h and PRN   Swallow/Nutrition/ Hydration     Dys. 1 textures with thin liquids, Max A  Mod A  increase PO intake, increase utilization of swallowing strategies, trials upgraded textures   ADL's     UB dressing - min A ; LB dressing mod A, mod A with extra time for sit to stands, decr intiation and activity  tolerance, +2 for transfers for safety  mod A overall, min A UB dressing  attention, orientation, transfers, sitting balance, bed mobility   Mobility     Mod Ax1person to Ax2 persons overall dependent upon alertness arousal, attention, initiation, ability to follow simple commands; req mod-total cueing overall  Supervision for sitting balance; Min A for cognition and w/c propulsion; Mod A for bed mobility, transfers, balance, ambulation, stair negotiation.   activity tolerance, bed mobility, transfers, balance, standing balance, attention, initiation, orientation   Communication     Max A  Mod A  initiation of verbal responses   Safety/Cognition/ Behavioral Observations    Max-Total A  mod assist  attention, initaition, problem solving, orientation    Pain     denies pain, no evidence of pain       Skin      Air overlay mattress, excoriation to scrotum - applying barrier cream PRN  no new skin breakdown or infection during rehab stay with mod assist.  Assess skin q shirt and PRN, turn q 2 hours    Rehab Goals Patient on target to meet rehab goals: Yes *See Care Plan and progress notes for long and short-term goals.    Barriers to Discharge:  see prior, pain      Possible Resolutions to Barriers:    rx pain, distraction      Discharge Planning/Teaching Needs:    wife still plans to have pt d/c home with her and other family members providing 24/7 assistance      Team Discussion:    Improved attention.   Started on D1 diet with thin but actually completing a meal may be a barrier.  Moderate assist to initiate at times.  Still incont b/b and does not appear to have any awareness.  Timed toileting not successful so far.  On track for minimal assist w/c  Level.  Good session with wife on Monday, however, she does not verbally offer much opinion about how she feels he is progressing.  SW to follow up.   Revisions to Treatment Plan:    None    Continued Need for Acute Rehabilitation Level of Care: The patient requires daily medical management by a physician with specialized training in physical medicine and rehabilitation for the following conditions: Daily direction of a multidisciplinary physical rehabilitation program to ensure safe treatment while eliciting the highest outcome that is of practical value to the patient.: Yes Daily medical management of patient stability for increased activity during participation in an intensive rehabilitation regime.: Yes Daily analysis of laboratory values and/or radiology reports with any subsequent need for medication adjustment of medical intervention for : Post surgical problems;Neurological problems  Tameya Kuznia, [place] 08/19/2014, 10:49 AM         Tony Pall, LCSW Social Worker Signed  Patient Care Conference Service date: 08/11/2014 6:43 AM  Inpatient  RehabilitationTeam Conference and Plan of Care Update Date: 08/10/2014   Time: 2:50 PM     Patient Name: Tony Davis       Medical Record Number: [id number]   Date of Birth: [birth year] Sex: Male         Room/Bed: 4W05C/4W05C-01 Payor Info: Payor: MEDICARE / Plan: MEDICARE PART A AND B / Product Type: *No Product type* /   Admitting Diagnosis: DECOND  S P L BG   Admit Date/Time:  07/30/2014  8:40 PM Admission Comments: No comment available   Primary Diagnosis:  <principal problem not specified> Principal Problem: <principal problem not specified>  Patient Active Problem List     Diagnosis  Date Noted   .  Basal ganglia hemorrhage  07/30/2014   .  Syncope due to orthostatic hypotension  04/15/2014   .  Medication adverse effect  04/15/2014   .  Syncope  04/15/2014   .  Routine general medical examination at a health care facility  02/11/2014   .  BPH associated with nocturia  02/11/2014   .  Gynecomastia  02/11/2014   .  Osteoarthritis of left knee  08/21/2013   .  Osteoarthritis of right knee  08/21/2013   .  Insomnia, persistent  03/25/2013   .  History of tobacco use  03/25/2013   .  Chronic rhinitis  03/25/2013   .  Hemiparesis due to old cerebral infarction  03/20/2013   .  DJD (degenerative joint disease) of knee  10/13/2012   .  Folate deficiency anemia  10/13/2012   .  Long term (current) use of anticoagulants  03/28/2012   .  DVT (deep venous thrombosis)  03/19/2012   .  Obstructive sleep apnea  08/16/2011   .  Type II or unspecified type diabetes mellitus without mention of complication, uncontrolled  08/06/2011   .  PATENT FORAMEN OVALE  03/13/2010   .  CVA  02/08/2010   .  CAD, NATIVE VESSEL  01/26/2010   .  Hyperlipidemia LDL goal < 100  01/23/2010   .  Essential hypertension, benign  01/23/2010   .  GERD  01/23/2010     Expected Discharge Date: Expected Discharge Date: 09/02/14  Team Members Present: Physician leading conference: Dr. Alger Simons Social Worker Present: Tony Pall, LCSW Nurse Present: Elliot Cousin, RN PT Present: Otis Brace, PT;Bridgett Ripa, PT OT Present: Roanna Epley, [place], OT SLP Present: Gunnar Fusi, SLP PPS Coordinator present : Daiva Nakayama, RN, CRRN        Current Status/Progress  Goal  Weekly Team Focus   Medical     some improvement in arousal, attention. trach out  improve attention  improve activation, attention   Bowel/Bladder     Incontinent of bowel and bladder; LBM 8/31- liquid BMs  Manage bowel and bladder with mod assist.  offer toileting q2h and prn   Swallow/Nutrition/ Hydration     NPO with peg  Max Assist  Initiated water protocol   ADL's     UB dressing-mod A; bathing and LB dressing-tot A; transfers-tot A + 2; max A for task initiation; decreased activity tolerance; decreased attention  mod A overall, min A UB dressing  activity tolerance, bed mobility, attention, transfers, sitting balance   Mobility     ModAx1person to Ax2oersons overall for bed mobility, squat pivot transfers, t/f sit<>stand, amb w/ use of Sara +; Mod-max cues for focused attention, initiation, following simple step commands  Supervision for sitting balance; Min A for cognition and w/c propulsion; Mod A for bed mobility, transfers; Max A for standing balance and ambulation  activity tolerance, bd mobility, transfers, balance, standing balance, attention, initiation, orientation   Communication     Max-Total A  Mod A  Initiation of expression of wants/needs, following 1 step commands   Safety/Cognition/ Behavioral Observations    Total A  mod assist  Sustained attention, initiation, basic problem solving   Pain     Denies pain; no evidence of pain  < 3  Assess and treat for pain q shift and prn   Skin     air overlay mattress;  excoriation to scrotum with few open areas- applying barrier cream prn  no new skin breakdown or infection during rehab stay with mod assist.  Turn q2h; utilize air  overlay mattress; assess skin shift and prn    Rehab Goals Patient on target to meet rehab goals: Yes *See Care Plan and progress notes for long and short-term goals.    Barriers to Discharge:  see prior, chronicity of injury      Possible Resolutions to Barriers:    NMR, cognitive perceptual rx      Discharge Planning/Teaching Needs:    wife still plans to have pt d/c home with her and other family members providing 24/7 assistance   ongoing    Team Discussion:    Lurline Idol out.  Some improved initiation but not consistent.  Knee pain significantly inhibiting standing.  MD feels may need to limit amount of time up due to severe OA of both knees.  Overall goals of mod assist at w/c level. H2O protocol underway.     Revisions to Treatment Plan:    May need to revise standing/ ambulation goals    Continued Need for Acute Rehabilitation Level of Care: The patient requires daily medical management by a physician with specialized training in physical medicine and rehabilitation for the following conditions: Daily direction of a multidisciplinary physical rehabilitation program to ensure safe treatment while eliciting the highest outcome that is of practical value to the patient.: Yes Daily medical management of patient stability for increased activity during participation in an intensive rehabilitation regime.: Yes Daily analysis of laboratory values and/or radiology reports with any subsequent need for medication adjustment of medical intervention for : Neurological problems;Post surgical problems  Teddy Pena 08/11/2014, 6:43 AM

## 2014-08-19 NOTE — Progress Notes (Signed)
Physical Therapy Session Note  Patient Details  Name: Tony Davis MRN: [id number] Date of Birth: 1961/03/03  Today's Date: 08/19/2014 PT Individual Time: 1400-1500 PT Individual Time Calculation (min): 60 min   Short Term Goals: Week 3:  PT Short Term Goal 1 (Week 3): Pt will demonstrated focused attention to therapeutic task for 30 seconds with max multimodal cues PT Short Term Goal 2 (Week 3): Pt to perform bed mobility with mod Ax1person, 50% of time PT Short Term Goal 3 (Week 3): Pt to perform bed<>w/c transfers with max Ax1 person PT Short Term Goal 4 (Week 3): Pt to demonstrate standing balance with max Ax1person, 50% of time PT Short Term Goal 5 (Week 3): Pt to ambulate 100' with mod Ax1 person  Skilled Therapeutic Interventions/Progress Updates:  1:1. Pt received sitting in w/c at nurses station, initially resistant to therapy. Focus this session on activity tolerance, active participation, orientation, sustained attention and functional mobility. Pt disoriented to place/situation/time, wanting to go to his Lucianne Lei, declining to push w/c and putting hands/feet out to prevent therapist from moving chair. When questioned regarding orientation, pt would repeat question back to therapist "do YOU know where we are?"  Pt reoriented, but unlikely retained. Pt taken to therapy car, however, declined to t/f into vehicle as that wasn't his [place]i. Pt eventually agreeable to ambulation, amb 150' w/ RW and min A in hallway and into therapy apartment resting on EOB. Pt engaged in conversation x59min regarding coaching and sports. Pt agreeable to second bout of amb, amb 225'x1 w/ min-mod A for stability especially during turns in busier areas. Pt amb back to his room from therapy apartment w/out prompting or cues. Pt amb w/ very slow pace overall and variable flexed posture due to proximity of RW to body and narrow BOS. Pt req intermittent cueing for increased pace and management of RW. Pt positioned in  recliner at end of session and seated at nurses station w/ quick release belt in place. No complaints of pain this session.   Therapy Documentation Precautions:  Precautions Precautions: Fall;Other (comment) Precaution Comments: PEG/abdominal precautions, trach, Bilateral thigh high TED hose  Restrictions Weight Bearing Restrictions: No Pain: Pain Assessment Pain Assessment: No/denies pain  See FIM for current functional status  Therapy/Group: Individual Therapy  Gilmore Laroche 08/19/2014, 4:18 PM

## 2014-08-19 NOTE — Progress Notes (Signed)
Occupational Therapy Session Note  Patient Details  Name: Tony Davis MRN: [id number] Date of Birth: 1961/03/14  Today's Date: 08/19/2014 OT Individual Time: 740-7:55 (37min)  1100-1150 OT Individual Time Calculation (min): 50 min    Skilled Therapeutic Interventions/Progress Updates:  1st session: focus on bed mobility and transfer training in prep for self feeding breakfast. Pt require more than reasonable amt of time to initiation coming to EOB. With the extra time and tactile cues for initiation   and attention to task- pt able to perform task with min A. Pt able to stand and pivot to w/c with min to mod A but required A to adjust bottom into w/c due to not pivoting enough to square hips to w/c.   2nd session: Pt was in bed when arrived. Focused on bed mobility to come to EOB, grooming and washing under arms and periarea and dressing sitting EOB. Pt requires more than reasonable amt of time to initiate task however was able to orient clothing properly after retrieving it from bedside table and was able to don. Pt needed total A to be oriented to time, situation and place even with external cues. Pt able to perform sit to stand with min A with mod- max tactile cues. Standing balance for therapist to don clean brief and was peri area after incontinent episode with min to mod A. Pt performed transfer (stand pivot) from bed to w/c with mod A with extra time and mod cuing with A to adjust hips into chair after sat in w/c. Pt brought up to RN station in prep to eat lunch with tech.      Therapy Documentation Precautions:  Precautions Precautions: Fall;Other (comment) Precaution Comments: PEG/abdominal precautions, trach, Bilateral thigh high TED hose  Restrictions Weight Bearing Restrictions: No Pain: No c/o pain in either session.  See FIM for current functional status  Therapy/Group: Individual Therapy  Willeen Cass [facility] 08/19/2014, 12:08 PM

## 2014-08-19 NOTE — Progress Notes (Signed)
Calorie Count Note  48 hour calorie count ordered.  Diet: Dysphagia I with Thin Liquids Supplements: Ensure Complete BID, Magic Cup TID between meals  Day 2: 9/10 Breakfast: 546 kcal and 20 grams of protein 9/10 Lunch: 0 kcal and 0 grams of protein 9/10 Dinner: 520 kcal and 26 grams of protein Supplements: (2.5 ensures) 875 kcal and 33 grams of protein  Total intake: 1941 kcal (97% of minimum estimated needs)  79 protein (79% of minimum estimated needs)  Per RN pt has been eating well, however pt refused lunch (9/10) but did instead drink an Ensure. Pt po intake has been improving compared to day 1, where only about 73% of his needs were met, to day 2 where 97% of kcal needs and 79% of protein needs are met. Pt has been drinking/eating his oral supplements ordered. Will continue with current orders.   Nutrition Dx: Inadequate oral intake now related to dysphagia and cognition as evidenced by dysphagia 1 diet; improving  Goal: Pt to meet >/= 90% of their estimated nutrition needs; partially met  Intervention:  Continue Magic cup TID between meals, each supplement provides 290 kcal and 9 grams of protein  Continue Ensure Complete po BID, each supplement provides 350 kcal and 13 grams of protein  Encourage PO intake.  Kallie Locks, MS, Provisional LDN Pager # [phone] After hours/ weekend pager # [phone]

## 2014-08-19 NOTE — Progress Notes (Signed)
The skilled treatment note has been reviewed and SLP is in agreement.  Iwao Shamblin, M.A., CCC-SLP  [phone]   

## 2014-08-20 ENCOUNTER — Inpatient Hospital Stay (HOSPITAL_COMMUNITY): Payer: Medicare Other | Admitting: Speech Pathology

## 2014-08-20 ENCOUNTER — Inpatient Hospital Stay (HOSPITAL_COMMUNITY): Payer: Medicare Other

## 2014-08-20 DIAGNOSIS — J9600 Acute respiratory failure, unspecified whether with hypoxia or hypercapnia: Secondary | ICD-10-CM

## 2014-08-20 DIAGNOSIS — M1710 Unilateral primary osteoarthritis, unspecified knee: Secondary | ICD-10-CM

## 2014-08-20 DIAGNOSIS — IMO0002 Reserved for concepts with insufficient information to code with codable children: Secondary | ICD-10-CM

## 2014-08-20 DIAGNOSIS — Z5189 Encounter for other specified aftercare: Secondary | ICD-10-CM

## 2014-08-20 DIAGNOSIS — I619 Nontraumatic intracerebral hemorrhage, unspecified: Secondary | ICD-10-CM

## 2014-08-20 DIAGNOSIS — I10 Essential (primary) hypertension: Secondary | ICD-10-CM

## 2014-08-20 LAB — GLUCOSE, CAPILLARY
Glucose-Capillary: 86 mg/dL (ref 70–99)
Glucose-Capillary: 119 mg/dL — ABNORMAL HIGH (ref 70–99)
Glucose-Capillary: 96 mg/dL (ref 70–99)
Glucose-Capillary: 147 mg/dL — ABNORMAL HIGH (ref 70–99)

## 2014-08-20 MED ORDER — INSULIN ASPART 100 UNIT/ML ~~LOC~~ SOLN: 0.0000 [IU] | Freq: Every day | SUBCUTANEOUS | Status: DC

## 2014-08-20 MED ORDER — INSULIN ASPART 100 UNIT/ML ~~LOC~~ SOLN
0.0000 [IU] | Freq: Three times a day (TID) | SUBCUTANEOUS | Status: DC
  Administered 2014-09-06: 1 [IU] via SUBCUTANEOUS

## 2014-08-20 NOTE — Progress Notes (Signed)
Subjective/Complaints:   No new problems. Feeling pretty well Review of Systems -remains limited due to cognitive status   Objective: Vital Signs: Blood pressure 130/85, pulse 85, temperature 98.2 F (36.8 C), temperature source Oral, resp. rate 18, weight 112 kg (246 lb 14.6 oz), SpO2 96.00%. No results found. Results for orders placed during the hospital encounter of 07/30/14 (from the past 72 hour(s))  GLUCOSE, CAPILLARY     Status: Abnormal   Collection Time    08/17/14 12:11 PM      Result Value Ref Range   Glucose-Capillary 187 (*) 70 - 99 mg/dL   Comment 1 Notify RN    GLUCOSE, CAPILLARY     Status: Abnormal   Collection Time    08/17/14  4:45 PM      Result Value Ref Range   Glucose-Capillary 101 (*) 70 - 99 mg/dL  GLUCOSE, CAPILLARY     Status: Abnormal   Collection Time    08/17/14  9:24 PM      Result Value Ref Range   Glucose-Capillary 160 (*) 70 - 99 mg/dL  GLUCOSE, CAPILLARY     Status: Abnormal   Collection Time    08/18/14  8:05 AM      Result Value Ref Range   Glucose-Capillary 118 (*) 70 - 99 mg/dL  GLUCOSE, CAPILLARY     Status: Abnormal   Collection Time    08/18/14 11:57 AM      Result Value Ref Range   Glucose-Capillary 105 (*) 70 - 99 mg/dL   Comment 1 Notify RN    GLUCOSE, CAPILLARY     Status: None   Collection Time    08/18/14  4:57 PM      Result Value Ref Range   Glucose-Capillary 92  70 - 99 mg/dL  GLUCOSE, CAPILLARY     Status: Abnormal   Collection Time    08/18/14  9:11 PM      Result Value Ref Range   Glucose-Capillary 144 (*) 70 - 99 mg/dL  GLUCOSE, CAPILLARY     Status: Abnormal   Collection Time    08/19/14  5:56 AM      Result Value Ref Range   Glucose-Capillary 105 (*) 70 - 99 mg/dL  GLUCOSE, CAPILLARY     Status: Abnormal   Collection Time    08/19/14 12:00 PM      Result Value Ref Range   Glucose-Capillary 125 (*) 70 - 99 mg/dL   Comment 1 Notify RN    GLUCOSE, CAPILLARY     Status: None   Collection Time   08/19/14  4:52 PM      Result Value Ref Range   Glucose-Capillary 89  70 - 99 mg/dL  GLUCOSE, CAPILLARY     Status: Abnormal   Collection Time    08/19/14  9:30 PM      Result Value Ref Range   Glucose-Capillary 132 (*) 70 - 99 mg/dL  GLUCOSE, CAPILLARY     Status: None   Collection Time    08/20/14  7:08 AM      Result Value Ref Range   Glucose-Capillary 86  70 - 99 mg/dL   Comment 1 Notify RN       HEENT: neck clean. Trach site closed Cardio: RRR and no murmur Resp: CTA B/L and unlabored, clear.  GI: BS positive and PEG site clean. Extremity:  Bilateral pedal edema trace to 1+--improved Skin:   Intact other than above Neuro:   Initiates more. Better  attention/awareness.  Abnormal Sensory reduced on left difficult to assess secondary to MS, Abnormal Motor 4 LUE, 3 to 3+ RLE, 2 to 3 LLE, 5/5 RUE grossly --quicker activtation Musc/Skel:  Normal GEN NAD   Assessment/Plan: 1. Functional deficits secondary to left Thalamic ICH, Hydrocephalus with Right  shunt which require 3+ hours per day of interdisciplinary therapy in a comprehensive inpatient rehab setting. Physiatrist is providing close team supervision and 24 hour management of active medical problems listed below. Physiatrist and rehab team continue to assess barriers to discharge/monitor patient progress toward functional and medical goals.  FIM: FIM - Bathing Bathing Steps Patient Completed: Chest;Right Arm;Left Arm;Abdomen;Front perineal area;Right upper leg;Left upper leg Bathing: 3: Mod-Patient completes 5-7 82f 10 parts or 50-74%  FIM - Upper Body Dressing/Undressing Upper body dressing/undressing steps patient completed: Thread/unthread right sleeve of pullover shirt/dresss;Thread/unthread left sleeve of pullover shirt/dress;Put head through opening of pull over shirt/dress;Pull shirt over trunk Upper body dressing/undressing: 5: Supervision: Safety issues/verbal cues FIM - Lower Body Dressing/Undressing Lower body  dressing/undressing steps patient completed: Thread/unthread right pants leg;Thread/unthread left pants leg;Pull pants up/down;Don/Doff right sock;Don/Doff left sock (able to doff sock but needed A to don) Lower body dressing/undressing: 3: Mod-Patient completed 50-74% of tasks  FIM - Toileting Toileting steps completed by patient: Adjust clothing prior to toileting Toileting Assistive Devices: Grab bar or rail for support Toileting: 1: Two helpers  FIM - Radio producer Devices: Recruitment consultant Transfers: 4-To toilet/BSC: Min A (steadying Pt. > 75%);1-Two helpers  FIM - Control and instrumentation engineer Devices: Arm rests Bed/Chair Transfer: 4: Bed > Chair or W/C: Min A (steadying Pt. > 75%);4: Chair or W/C > Bed: Min A (steadying Pt. > 75%)  FIM - Locomotion: Wheelchair Locomotion: Wheelchair: 1: Total Assistance/staff pushes wheelchair (Pt<25%) FIM - Locomotion: Ambulation Locomotion: Ambulation Assistive Devices: Administrator Ambulation/Gait Assistance: 4: Min assist;3: Mod assist Locomotion: Ambulation: 3: Travels 150 ft or more with moderate assistance (Pt: 50 - 74%)  Comprehension Comprehension Mode: Auditory Comprehension: 3-Understands basic 50 - 74% of the time/requires cueing 25 - 50%  of the time  Expression Expression Mode: Verbal Expression Assistive Devices: 6-Talk trach valve Expression: 3-Expresses basic 50 - 74% of the time/requires cueing 25 - 50% of the time. Needs to repeat parts of sentences.  Social Interaction Social Interaction Mode: Asleep Social Interaction: 2-Interacts appropriately 25 - 49% of time - Needs frequent redirection.  Problem Solving Problem Solving Mode: Asleep Problem Solving: 2-Solves basic 25 - 49% of the time - needs direction more than half the time to initiate, plan or complete simple activities  Memory Memory Mode: Asleep Memory: 3-Recognizes or recalls 50 - 74% of the  time/requires cueing 25 - 49% of the time  Medical Problem List and Plan:  1. Functional deficits secondary to Left thalamic bleed with hydrocephalus.  2. DVT Prophylaxis/Anticoagulation/has IVCF: Pharmaceutical: Lovenox-  -TEDs for edema, elevation  -dopplers + (has hx)  3. Chronic B-knee/Pain Management: Prn oxycodone.   Voltaren gel for knees.  -poorly-compliant pain patient in my practice 4. Mood: Monitor as mentation improves.  5. Neuropsych: This patient is not capable of making decisions on his own behalf.   -continue low dose ritalin---will not push higher given CV history 6. Skin/Wound Care:   Continue air mattress overlay.   7. DM type 2: Will monitor BS with ac/hs checks. Continue Levemir bid and adjust as indicated.  -reasonable control still  8. Enterobacter tracheobronchitis: abx completed  -decannulated  7. HTN/CAD:  -metoprolol bid  -added HCTZ to assist with edema as well as bp---has helped  -increased norvasc to 10mg  8. ABLA: on iron supplement.   9. Dysphagia: on d1 thins, eating well  -off TF   LOS (Days) 21 A FACE TO FACE EVALUATION WAS PERFORMED  Tony Davis,Tony Davis 08/20/2014, 7:46 AM

## 2014-08-20 NOTE — Plan of Care (Signed)
Problem: RH Balance Goal: LTG Patient will maintain dynamic sitting balance (PT) LTG: Patient will maintain dynamic sitting balance with assistance during mobility activities (PT)  Upgraded due to increased activity tolerance, improved balance and strength.  Goal: LTG Patient will maintain dynamic standing balance (PT) LTG: Patient will maintain dynamic standing balance with assistance during mobility activities (PT)  Upgraded due to increased activity tolerance, improved balance and strength.   Problem: RH Bed Mobility Goal: LTG Patient will perform bed mobility with assist (PT) LTG: Patient will perform bed mobility with assistance, with/without cues (PT).  Upgraded due to increased activity tolerance, improved balance and strength, decreased cues for initiation.   Problem: RH Bed to Chair Transfers Goal: LTG Patient will perform bed/chair transfers w/assist (PT) LTG: Patient will perform bed/chair transfers with assistance, with/without cues (PT).  Upgraded due to increased activity tolerance, improved balance and strength, decreased cues for initiation.  Problem: RH Car Transfers Goal: LTG Patient will perform car transfers with assist (PT) LTG: Patient will perform car transfers with assistance (PT).  Upgraded due to increased activity tolerance, improved balance and strength, decreased cues for initiation.  Problem: RH Ambulation Goal: LTG Patient will ambulate in controlled environment (PT) LTG: Patient will ambulate in a controlled environment, # of feet with assistance (PT).  Upgraded due to increased activity tolerance, improved balance and strength, decreased cues for initiation.  Problem: RH Wheelchair Mobility Goal: LTG Patient will propel w/c in controlled environment (PT) LTG: Patient will propel wheelchair in controlled environment, # of feet with assist (PT)  Upgraded due to increased activity tolerance, improved balance and strength, decreased cues for  initiation. Goal: LTG Patient will propel w/c in home environment (PT) LTG: Patient will propel wheelchair in home environment, # of feet with assistance (PT).  Upgraded due to increased activity tolerance, improved balance and strength, decreased cues for initiation.  Problem: RH Attention Goal: LTG Patient will demonstrate focused/sustained (PT) LTG: Patient will demonstrate focused/sustained/selective/alternating/divided attention during functional mobility in specific environment with assist for # of minutes (PT)  Upgraded due to increased activity tolerance, improved balance and strength, decreased cues for initiation.  Problem: RH Awareness Goal: LTG: Patient will demonstrate intellectual/emergent (PT) LTG: Patient will demonstrate intellectual/emergent/anticipatory awareness with assist during a mobility activity (PT)  Upgraded due to increased activity tolerance, improved balance and strength, decreased cues for initiation.  Problem: RH Ambulation Goal: LTG Patient will ambulate in home environment (PT) LTG: Patient will ambulate in home environment, # of feet with assistance (PT).  Upgraded due to increased activity tolerance, improved balance and strength, decreased cues for initiation.  Problem: RH Stairs Goal: LTG Patient will ambulate up and down stairs w/assist (PT) LTG: Patient will ambulate up and down # of stairs with assistance (PT)  Upgraded due to increased activity tolerance, improved balance and strength, decreased cues for initiation.

## 2014-08-20 NOTE — Progress Notes (Signed)
Occupational Therapy Session Note  Patient Details  Name: Tony Davis MRN: [id number] Date of Birth: 01-23-1961  Today's Date: 08/20/2014 OT Individual Time: 1000-1100 OT Individual Time Calculation (min): 60 min    Short Term Goals: Week 3:  OT Short Term Goal 1 (Week 3): PT will demonstrate sustained attention to functional task for 3 min with mod cuing OT Short Term Goal 2 (Week 3): Pt will perform LB dressing wtih mod A  OT Short Term Goal 3 (Week 3): Pt will self feed 50% of a meal with mod cuing OT Short Term Goal 4 (Week 3): Pt will perform shower transfer with max A  OT Short Term Goal 5 (Week 3): Pt will bathe with mod A at shower level   Skilled Therapeutic Interventions/Progress Updates: ADL-retraining at shower and sink level with emphasis on improved initiation, transfers, sustained attention to task.   Pt was received for treatment in his w/c at RN station.   In controlled environment (pt's room) with environmental distractions reduced pt initially agreed to shower level ADL, verbalizing desire to "get in the shower."   After min assist to remove gown and left sock, pt attempted squat pivot and stand pivot transfers from w/c but was unable to lift off seat successfully with max assist X 1.   Pt then became distracted by diverter valve lever, perseverating over turning knob, and additional attempts at transfer to [facility] in shower were unproductive.   Pt was returned to sink with setup and initiated upper body bathing with min assist (hand guidance) to transition from washing his face to washing his arms, chest and stomach.  Pt required redirection 3 times during task and then reported desire for assist with shaving after questioning cue on grooming.   With max assist to shave his face, pt sustained attention to cues to turn/lift his head during task for 7 minutes while he remained focused on his appearance in the mirror.   Pt required total assist to wash LE and don clothing however he  reached to wash his left foot and lower leg with min verbal cues after presented with wash cloth.   Pt performed one sit<>stand with +2 help from RN staff to change diaper.  Pt left in w/c with RN attending at end of session.  Therapy Documentation Precautions:  Precautions Precautions: Fall;Other (comment) Precaution Comments: PEG/abdominal precautions, trach, Bilateral thigh high TED hose  Restrictions Weight Bearing Restrictions: No  Pain: Pain Assessment Pain Assessment: No/denies pain  See FIM for current functional status  Therapy/Group: Individual Therapy  [place] 08/20/2014, 12:40 PM

## 2014-08-20 NOTE — Progress Notes (Signed)
Physical Therapy Weekly Progress Note  Patient Details  Name: Tony Davis MRN: [id number] Date of Birth: 1961/07/09  Beginning of progress report period: August 13, 2014 End of progress report period: August 20, 2014  Today's Date: 08/20/2014 PT Individual Time: 1400-1500 PT Individual Time Calculation (min): 60 min   Pt continues to make slow, but steady progress in physical therapy and has achieved 5/5 STGs, see details below. Majority of time, pt currently able to perform bed mobility with min-mod Ax1person, bed<>w/c transfers with mod-max Ax1person via squat pivot or SPT, w/c propulsion up to 75' with min Ax1person, ambulation >150' with min-mod Ax1person, negotiation up/down 5 steps with min-mod Ax1person and maintain standing balance with variable UE support and min-mod Ax1person. On occasion, pt can req total Ax2 persons for completion of functional transfers and mobility due to fatigue and decreased initiation. Overall, pt req min-max multimodal cues throughout session for initiation, attention and awareness to functional tasks.   Patient continues to demonstrate the following deficits: decreased activity tolerance, decreased strength, decreased balance, decreased postural control, decreased orientation, decreased initiation, decreased memory, decreased problem solving, decreased awareness, decreased attention, decreased functional transfers and mobility and therefore will continue to benefit from skilled PT intervention to enhance overall performance with activity tolerance, balance, postural control, functional use of right upper extremity, right lower extremity, left upper extremity and left lower extremity, attention, awareness, coordination and overall functional transfers and mobility.  Patient progressing toward long term goals..  Plan of care revisions: Pts LTGs upgraded due to improvements in cognition, strength, balance and coordination.   PT Short Term Goals Week 1:  PT  Short Term Goal 1 (Week 1): Pt will transfer out of bed to w/c using maxi sky lift PT Short Term Goal 1 - Progress (Week 1): Met PT Short Term Goal 2 (Week 1): Pt will tolerate sitting up in tilt in space w/c for 2 hours. PT Short Term Goal 2 - Progress (Week 1): Met PT Short Term Goal 3 (Week 1): Pt will roll in bed req 1 person assist PT Short Term Goal 3 - Progress (Week 1): Progressing toward goal PT Short Term Goal 4 (Week 1): Pt will tolerate supine/side lie to sit transfer req 2 person assist.  PT Short Term Goal 4 - Progress (Week 1): Met PT Short Term Goal 5 (Week 1): Pt will demonstrate sustained focus of an activity for 30 seconds.  PT Short Term Goal 5 - Progress (Week 1): Not met Week 2:  PT Short Term Goal 1 (Week 2): Pt will demonstrate focused attention to therapeutic task for 30 seconds w/ max multimodal cues PT Short Term Goal 1 - Progress (Week 2): Progressing toward goal PT Short Term Goal 2 (Week 2): Pt to perform bed mobility w/ max Ax1person PT Short Term Goal 2 - Progress (Week 2): Progressing toward goal PT Short Term Goal 3 (Week 2): Pt to transfer bed<>w/c with Ax2 persons without use of lift equipment PT Short Term Goal 3 - Progress (Week 2): Met PT Short Term Goal 4 (Week 2): Pt to demonstrate sitting balance with min guard A, 50% of time PT Short Term Goal 4 - Progress (Week 2): Met PT Short Term Goal 5 (Week 2): Pt will ambulate 100' w/ Ax2 persons PT Short Term Goal 5 - Progress (Week 2): Met Week 3:  PT Short Term Goal 1 (Week 3): Pt will demonstrated focused attention to therapeutic task for 30 seconds with max multimodal cues PT Short Term  Goal 1 - Progress (Week 3): Met PT Short Term Goal 2 (Week 3): Pt to perform bed mobility with mod Ax1person, 50% of time PT Short Term Goal 2 - Progress (Week 3): Met PT Short Term Goal 3 (Week 3): Pt to perform bed<>w/c transfers with max Ax1 person PT Short Term Goal 3 - Progress (Week 3): Met PT Short Term Goal 4  (Week 3): Pt to demonstrate standing balance with max Ax1person, 50% of time PT Short Term Goal 4 - Progress (Week 3): Met PT Short Term Goal 5 (Week 3): Pt to ambulate 100' with mod Ax1 person PT Short Term Goal 5 - Progress (Week 3): Met Week 4:  PT Short Term Goal 1 (Week 4): Pt to perform bed mobility with min Ax1person, 50% of time PT Short Term Goal 2 (Week 4): Pt to perform bed<>w/c transfers with min Ax1person, 50% of time PT Short Term Goal 3 (Week 4): Pt to propel w/c 100' with min Ax1person, 50% of time PT Short Term Goal 4 (Week 4): Pt to ambulate 150' with min Ax1 person, 50% of time PT Short Term Goal 5 (Week 4): Pt to negotiate up/down 5 steps w/ B rail and min Ax1person, 50% of time  Skilled Therapeutic Interventions/Progress Updates:  1:1. Pt received sitting in w/c at nurses station, ready for therapy. Focus this session on sustained>selective attention, initiation, following simple commands, functional w/c propulsion, therex and functional ambulation. Pt req mod-max cues for selective attention to therapeutic tasks in busy hallway and gym environments. Pt appropriately following simple commands >75% of time and initiation functional transfers w/ min cues. Pt propelled w/c 125'x1 w/ B UE and overall (S), req significantly increased time for completion due to distractibility. Mod A for SPT w/c<>NuStep w/ RW and min-mod A for multiple t/f sit<>stand. Pt utilized NuStep for B UE/LE strength/endurance, level 3x58mn w/ good tolerance cues to maintain pace in 40-50s. Pt amb 225'x1 w/ RW and min A in busy hallways and on carpet in day room, tactile cues for posture and proximity to RW. Pt continues to demonstrate narrow BOS, slightly improved w/ facilitated increase in pace. Pt unable to complete final t/f sit>stand for stair negotiation attempt due to distractibility in busy hallway. Pt left sitting at nurses station w/ quick release belt in place.   Therapy Documentation Precautions:   Precautions Precautions: Fall;Other (comment) Precaution Comments: PEG/abdominal precautions, trach, Bilateral thigh high TED hose  Restrictions Weight Bearing Restrictions: No   Vital Signs: Therapy Vitals Temp: 98.1 F (36.7 C) Temp src: Oral Pulse Rate: 100 Resp: 18 BP: 118/90 mmHg Patient Position (if appropriate): Sitting Oxygen Therapy SpO2: 95 % O2 Device: None (Room air) Pain: Pain Assessment Pain Assessment: No/denies pain  See FIM for current functional status  Therapy/Group: Individual Therapy  KGilmore Laroche9/10/2014, 5:24 PM

## 2014-08-20 NOTE — Progress Notes (Signed)
Chart and note reviewed. Agree with note.  Kimberly Harris, RD, LDN, CNSC Pager [phone] After Hours Pager [phone]   

## 2014-08-20 NOTE — Progress Notes (Signed)
Speech Language Pathology Daily Session Note  Patient Details  Name: Tony Davis MRN: [id number] Date of Birth: February 23, 1961  Today's Date: 08/20/2014 SLP Individual Time: 0800-0900 SLP Individual Time Calculation (min): 60 min  Short Term Goals: Week 3: SLP Short Term Goal 1 (Week 3): Patient will sustain attention to basic self-care task for ~2 minutes with Max multimodal cues.  SLP Short Term Goal 2 (Week 3): Patient will demonstrate use of call bell with Max multimodal cues. SLP Short Term Goal 3 (Week 3): Patient will follow basic, 1-step directions during self-care tasks with Mod multimodal cues. SLP Short Term Goal 4 (Week 3): Patient will initiate verbal response to yes/no questions with Mod multimodal cues. SLP Short Term Goal 5 (Week 3): Patient will utilize swallowing compensatory strategies with current diet to minimize overt s/s of aspiration with Min A multimodal cues.  SLP Short Term Goal 6 (Week 3): Patient will initiate functional tasks with Mod A multimodal cues.   Skilled Therapeutic Interventions: Skilled treatment session focused on cognitive-linguistic and dysphagia goals. SLP facilitated session by initially providing Min A verbal and visual cues for initiation of self-feeding during a breakfast meal of Dys. 1 textures with thin liquids. Pt required Mod verbal cues for utilization of small bites but did not demonstrate any overt s/s of aspiration. Patient also required Mod A multimodal cues for selective attention to self-feeding in a mildly distracting environment for ~20 minutes. Patient utilized verbal expression for responses instead of gestures with ~75% of responses with decreased language of confusion. Patient independently initiated a conversation with this clinician X1 and required Mod A verbal cues to increase vocal intensity for improved speech intelligibility. Continue with current plan of care.   FIM:  Comprehension Comprehension Mode:  Auditory Comprehension: 3-Understands basic 50 - 74% of the time/requires cueing 25 - 50%  of the time Expression Expression Mode: Verbal Expression: 3-Expresses basic 50 - 74% of the time/requires cueing 25 - 50% of the time. Needs to repeat parts of sentences. Social Interaction Social Interaction: 2-Interacts appropriately 25 - 49% of time - Needs frequent redirection. Problem Solving Problem Solving: 2-Solves basic 25 - 49% of the time - needs direction more than half the time to initiate, plan or complete simple activities Memory Memory: 3-Recognizes or recalls 50 - 74% of the time/requires cueing 25 - 49% of the time FIM - Eating Eating Activity: 5: Supervision/cues  Pain Pain Assessment Pain Assessment: No/denies pain  Therapy/Group: Individual Therapy  Caroleen Stoermer, [place] 08/20/2014, 12:10 PM

## 2014-08-21 ENCOUNTER — Inpatient Hospital Stay (HOSPITAL_COMMUNITY): Payer: Medicare Other

## 2014-08-21 ENCOUNTER — Inpatient Hospital Stay (HOSPITAL_COMMUNITY): Payer: Medicare Other | Admitting: Speech Pathology

## 2014-08-21 DIAGNOSIS — I82409 Acute embolism and thrombosis of unspecified deep veins of unspecified lower extremity: Secondary | ICD-10-CM

## 2014-08-21 DIAGNOSIS — N401 Enlarged prostate with lower urinary tract symptoms: Secondary | ICD-10-CM

## 2014-08-21 DIAGNOSIS — R351 Nocturia: Secondary | ICD-10-CM

## 2014-08-21 DIAGNOSIS — I6350 Cerebral infarction due to unspecified occlusion or stenosis of unspecified cerebral artery: Secondary | ICD-10-CM

## 2014-08-21 DIAGNOSIS — N138 Other obstructive and reflux uropathy: Secondary | ICD-10-CM

## 2014-08-21 LAB — GLUCOSE, CAPILLARY
GLUCOSE-CAPILLARY: 114 mg/dL — AB (ref 70–99)
Glucose-Capillary: 104 mg/dL — ABNORMAL HIGH (ref 70–99)
Glucose-Capillary: 102 mg/dL — ABNORMAL HIGH (ref 70–99)
Glucose-Capillary: 80 mg/dL (ref 70–99)

## 2014-08-21 NOTE — Progress Notes (Signed)
Physical Therapy Session Note  Patient Details  Name: Tony Davis MRN: [id number] Date of Birth: [birth year]  Today's Date: 08/21/2014 PT Individual Time: 7322-0254 PT Individual Time Calculation (min): 45 min   Short Term Goals: Week 4:  PT Short Term Goal 1 (Week 4): Pt to perform bed mobility with min Ax1person, 50% of time PT Short Term Goal 2 (Week 4): Pt to perform bed<>w/c transfers with min Ax1person, 50% of time PT Short Term Goal 3 (Week 4): Pt to propel w/c 100' with min Ax1person, 50% of time PT Short Term Goal 4 (Week 4): Pt to ambulate 150' with min Ax1 person, 50% of time PT Short Term Goal 5 (Week 4): Pt to negotiate up/down 5 steps w/ B rail and min Ax1person, 50% of time  Skilled Therapeutic Interventions/Progress Updates:  1:1. Pt received supine in bed,  ready for therapy. Focus this session on sustained>selective attention, following simple commands, initiation, bed mobility and gait training. Pt noted to be heavily incontinent of urine, req mod verbal cues to initiate rolling in supine due to decreased alertness/arousal with mod-max A for completion w/ use of bed rail. Total A for clean up. Pt req min A for t/f usp>sit EOB w/ use of bed rails only, noted increased alertness in sitting req only min cues for initiation remainder of session. Pt req (S) for doffing/donning shirt and total A for LB dressing for time management. Pt with excellent tolerance to ambulation w/ RW 250'>negotiation up 3 steps, down 2 steps w/ B rail>amb 175', req min A overall. Pt declined need for seated rest during standing mobility and denied pain. Pt following all simple commands with 100% accuracy this session. Pt left sitting in recliner at nurses station w/ quick release belt in place.   Therapy Documentation Precautions:  Precautions Precautions: Fall;Other (comment) Precaution Comments: PEG/abdominal precautions, trach, Bilateral thigh high TED hose  Restrictions Weight Bearing  Restrictions: No  See FIM for current functional status  Therapy/Group: Individual Therapy  Gilmore Laroche 08/21/2014, 9:09 AM

## 2014-08-21 NOTE — Progress Notes (Signed)
Tony Davis is a 53 y.o. male 10-27-1961 [id number]  Subjective: No new complaints. No new problems. Slept well. Feeling OK.  Objective: Vital signs in last 24 hours: Temp:  [97.6 F (36.4 C)-98.1 F (36.7 C)] 97.8 F (36.6 C) (09/12 0546) Pulse Rate:  [84-100] 84 (09/12 0546) Resp:  [18] 18 (09/12 0546) BP: (118-135)/(82-90) 135/82 mmHg (09/12 0546) SpO2:  [95 %-100 %] 100 % (09/12 0546) Weight change:  Last BM Date: 08/20/14  Intake/Output from previous day: 09/11 0701 - 09/12 0700 In: 470 [P.O.:420; NG/GT:50] Out: 400 [Urine:400] Last cbgs: CBG (last 3)   Recent Labs  08/20/14 2101 08/21/14 0749 08/21/14 1201  GLUCAP 147* 104* 102*     Physical Exam General: No apparent distress   HEENT: not dry Lungs: Normal effort. Lungs clear to auscultation, no crackles or wheezes. Cardiovascular: Regular rate and rhythm, no edema Abdomen: S/NT/ND; BS(+) Musculoskeletal:  unchanged Neurological: No new neurological deficits Wounds: N/A    Skin: clear Mental state: Alert, oriented, cooperative    Lab Results: BMET    Component Value Date/Time   NA 144 08/16/2014 0545   K 3.3* 08/16/2014 0545   CL 100 08/16/2014 0545   CO2 31 08/16/2014 0545   GLUCOSE 102* 08/16/2014 0545   BUN 11 08/16/2014 0545   CREATININE 0.73 08/16/2014 0545   CALCIUM 9.5 08/16/2014 0545   GFRNONAA >90 08/16/2014 0545   GFRAA >90 08/16/2014 0545   CBC    Component Value Date/Time   WBC 6.5 08/02/2014 0635   RBC 3.22* 08/02/2014 0635   HGB 9.5* 08/02/2014 0635   HCT 30.3* 08/02/2014 0635   PLT 317 08/02/2014 0635   MCV 94.1 08/02/2014 0635   MCH 29.5 08/02/2014 0635   MCHC 31.4 08/02/2014 0635   RDW 14.3 08/02/2014 0635   LYMPHSABS 1.5 08/02/2014 0635   MONOABS 0.4 08/02/2014 0635   EOSABS 0.3 08/02/2014 0635   BASOSABS 0.0 08/02/2014 0635    Studies/Results: No results found.  Medications: I have reviewed the patient's current medications.  Assessment/Plan:  1. Functional deficits secondary to  Left thalamic bleed with hydrocephalus.  2. DVT Prophylaxis/Anticoagulation/has IVCF: Pharmaceutical: Lovenox-  -TEDs for edema, elevation  -dopplers + (has hx)  3. Chronic B-knee/Pain Management: Prn oxycodone. Voltaren gel for knees.  -poorly-compliant pain patient in my practice  4. Mood: Monitor as mentation improves.  5. Neuropsych: This patient is not capable of making decisions on his own behalf.  -continue low dose ritalin---will not push higher given CV history  6. Skin/Wound Care: Continue air mattress overlay.  7. DM type 2: Will monitor BS with ac/hs checks. Continue Levemir bid and adjust as indicated.  -reasonable control still  8. Enterobacter tracheobronchitis: abx completed  -decannulated  7. HTN/CAD:  -metoprolol bid  -added HCTZ to assist with edema as well as bp---has helped  -increased norvasc to 10mg   8. ABLA: on iron supplement.  9. Dysphagia: on d1 thins, eating well  -off TF        Length of stay, days: 22  Walker Kehr , MD 08/21/2014, 1:24 PM

## 2014-08-21 NOTE — Progress Notes (Signed)
Speech Language Pathology Daily Session Note  Patient Details  Name: Tony Davis MRN: [id number] Date of Birth: September 25, 1961  Today's Date: 08/21/2014 SLP Individual Time: 1400-1430 SLP Individual Time Calculation (min): 30 min  Short Term Goals: Week 3: SLP Short Term Goal 1 (Week 3): Patient will sustain attention to basic self-care task for ~2 minutes with Max multimodal cues.  SLP Short Term Goal 2 (Week 3): Patient will demonstrate use of call bell with Max multimodal cues. SLP Short Term Goal 3 (Week 3): Patient will follow basic, 1-step directions during self-care tasks with Mod multimodal cues. SLP Short Term Goal 4 (Week 3): Patient will initiate verbal response to yes/no questions with Mod multimodal cues. SLP Short Term Goal 5 (Week 3): Patient will utilize swallowing compensatory strategies with current diet to minimize overt s/s of aspiration with Min A multimodal cues.  SLP Short Term Goal 6 (Week 3): Patient will initiate functional tasks with Mod A multimodal cues.   Skilled Therapeutic Interventions: Pt was seen for skilled speech therapy targeting cognitive goals.  Upon arrival, pt asleep in bed but easily awakened to voice and light touch.  SLP facilitated the session with overall mod assist cues to initiate and complete basic self care tasks such as washing his face and brushing his teeth due to perseveration and poor awareness of motor planning errors.  Pt required max-total assist to reorient to date with use of room calendar as an external aid to improve temporal awareness; min assist to orient to place and time of day via environmental cues.  Pt selectively attended to therapeutic tasks in a minimally distracting environment (ambient noise from hallway, door to room left open) with min cues for redirection for the duration of today's therapy session.  No overt s/s of aspiration with cup sips of thin liquids with pt demonstrating appropriate sip size.  Pt initiated  verbalization to say good bye to therapist with min assist verbal cues at the end of the session.  Continue per current plan of care.         FIM:  Comprehension Comprehension Mode: Auditory Comprehension: 3-Understands basic 50 - 74% of the time/requires cueing 25 - 50%  of the time Expression Expression Mode: Verbal Expression: 3-Expresses basic 50 - 74% of the time/requires cueing 25 - 50% of the time. Needs to repeat parts of sentences. Social Interaction Social Interaction: 2-Interacts appropriately 25 - 49% of time - Needs frequent redirection. Problem Solving Problem Solving: 2-Solves basic 25 - 49% of the time - needs direction more than half the time to initiate, plan or complete simple activities Memory Memory: 2-Recognizes or recalls 25 - 49% of the time/requires cueing 51 - 75% of the time FIM - Eating Eating Activity: 5: Supervision/cues  Pain Pain Assessment Pain Assessment: No/denies pain  Therapy/Group: Individual Therapy  Windell Moulding, M.A. CCC-SLP  [place], Selinda Or[place] 08/21/2014, 4:23 PM

## 2014-08-21 NOTE — Progress Notes (Signed)
Social Work Patient ID: Tony Davis, male   DOB: October 16, 1961, 53 y.o.   MRN: [id number]  LATE ENTRY:  Have spoken with pt's wife to review team conference an discuss d/c plans.  Wife feels pt is making nice gains and reports she still plans to have pt d/c home with her, her father and a neighbor providing needed assist.  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 I really woudn't want my Dad or our neighbor to do that..."  Explained to her that I will discuss this with txs, however, this may not be a realistic goal.  Wife plans to take two weeks off of work at time of d/c.  Stressed to her that his 24/7 care is expected to be indefinite.  She is hopeful that he will make "nice progress" but notes that "... If it falls through at home then I'll just have to look at him going to a nursing home."    Plan to continue to push wife on begin as involved in txs as possible and to have her father and neighbor become involved as well if they are to be part of the caregiving team.  Need to clarify how many SNF days pt truly has left under his insurance.  Wife has started his Medicaid application already.    Continue to follow.  Dalaysia Harms, LCSW

## 2014-08-22 ENCOUNTER — Inpatient Hospital Stay (HOSPITAL_COMMUNITY): Payer: Medicare Other

## 2014-08-22 LAB — GLUCOSE, CAPILLARY
GLUCOSE-CAPILLARY: 95 mg/dL (ref 70–99)
GLUCOSE-CAPILLARY: 128 mg/dL — AB (ref 70–99)
Glucose-Capillary: 88 mg/dL (ref 70–99)
Glucose-Capillary: 111 mg/dL — ABNORMAL HIGH (ref 70–99)

## 2014-08-22 NOTE — Progress Notes (Signed)
Tony Davis is a 53 y.o. male [birth year] [id number]  Subjective: No new complaints. Slept well. Feeling OK.  Objective: Vital signs in last 24 hours: Temp:  [97.9 F (36.6 C)-98.7 F (37.1 C)] 97.9 F (36.6 C) (09/13 0451) Pulse Rate:  [84-93] 84 (09/13 0451) Resp:  [18-19] 18 (09/13 0451) BP: (120-141)/(77-88) 141/82 mmHg (09/13 0451) SpO2:  [94 %-98 %] 98 % (09/13 0451) Weight:  [254 lb 4.8 oz (115.35 kg)] 254 lb 4.8 oz (115.35 kg) (09/13 0451) Weight change:  Last BM Date: 08/20/14  Intake/Output from previous day: 09/12 0701 - 09/13 0700 In: 360 [P.O.:360] Out: -  Last cbgs: CBG (last 3)   Recent Labs  08/21/14 2129 08/22/14 0741 08/22/14 1123  GLUCAP 114* 88 111*     Physical Exam General: No apparent distress   HEENT: not dry Lungs: Normal effort. Lungs clear to auscultation, no crackles or wheezes. Cardiovascular: Regular rate and rhythm, no edema Abdomen: S/NT/ND; BS(+) Musculoskeletal:  unchanged Neurological: No new neurological deficits Wounds: N/A    Skin: clear Mental state: Alert, oriented, cooperative    Lab Results: BMET    Component Value Date/Time   NA 144 08/16/2014 0545   K 3.3* 08/16/2014 0545   CL 100 08/16/2014 0545   CO2 31 08/16/2014 0545   GLUCOSE 102* 08/16/2014 0545   BUN 11 08/16/2014 0545   CREATININE 0.73 08/16/2014 0545   CALCIUM 9.5 08/16/2014 0545   GFRNONAA >90 08/16/2014 0545   GFRAA >90 08/16/2014 0545   CBC    Component Value Date/Time   WBC 6.5 08/02/2014 0635   RBC 3.22* 08/02/2014 0635   HGB 9.5* 08/02/2014 0635   HCT 30.3* 08/02/2014 0635   PLT 317 08/02/2014 0635   MCV 94.1 08/02/2014 0635   MCH 29.5 08/02/2014 0635   MCHC 31.4 08/02/2014 0635   RDW 14.3 08/02/2014 0635   LYMPHSABS 1.5 08/02/2014 0635   MONOABS 0.4 08/02/2014 0635   EOSABS 0.3 08/02/2014 0635   BASOSABS 0.0 08/02/2014 0635    Studies/Results: No results found.  Medications: I have reviewed the patient's current medications.  Assessment/Plan:  1.  Functional deficits secondary to Left thalamic bleed with hydrocephalus.  2. DVT Prophylaxis/Anticoagulation/has IVCF: Pharmaceutical: Lovenox-  -TEDs for edema, elevation  -dopplers + (has hx)  3. Chronic B-knee/Pain Management: Prn oxycodone. Voltaren gel for knees.  -poorly-compliant pain patient in my practice  4. Mood: Monitor as mentation improves.  5. Neuropsych: This patient is not capable of making decisions on his own behalf.  -continue low dose ritalin---will not push higher given CV history  6. Skin/Wound Care: Continue air mattress overlay.  7. DM type 2: Will monitor BS with ac/hs checks. Continue Levemir bid and adjust as indicated.  -reasonable control still  8. Enterobacter tracheobronchitis: abx completed  -decannulated  7. HTN/CAD:  -metoprolol bid  -added HCTZ to assist with edema as well as bp---has helped  -increased norvasc to 10mg   8. ABLA: on iron supplement.  9. Dysphagia: on d1 thins, eating well  -off TF   Length of stay, days: 23  Walker Kehr , MD 08/22/2014, 12:54 PM
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
## 2014-08-23 ENCOUNTER — Inpatient Hospital Stay (HOSPITAL_COMMUNITY): Payer: Medicare Other

## 2014-08-23 ENCOUNTER — Inpatient Hospital Stay (HOSPITAL_COMMUNITY): Payer: Medicare Other | Admitting: Speech Pathology

## 2014-08-23 DIAGNOSIS — I619 Nontraumatic intracerebral hemorrhage, unspecified: Secondary | ICD-10-CM

## 2014-08-23 DIAGNOSIS — IMO0002 Reserved for concepts with insufficient information to code with codable children: Secondary | ICD-10-CM

## 2014-08-23 DIAGNOSIS — M1710 Unilateral primary osteoarthritis, unspecified knee: Secondary | ICD-10-CM

## 2014-08-23 DIAGNOSIS — J9600 Acute respiratory failure, unspecified whether with hypoxia or hypercapnia: Secondary | ICD-10-CM

## 2014-08-23 DIAGNOSIS — I10 Essential (primary) hypertension: Secondary | ICD-10-CM

## 2014-08-23 DIAGNOSIS — Z5189 Encounter for other specified aftercare: Secondary | ICD-10-CM

## 2014-08-23 LAB — GLUCOSE, CAPILLARY
GLUCOSE-CAPILLARY: 90 mg/dL (ref 70–99)
GLUCOSE-CAPILLARY: 104 mg/dL — AB (ref 70–99)
Glucose-Capillary: 98 mg/dL (ref 70–99)
Glucose-Capillary: 136 mg/dL — ABNORMAL HIGH (ref 70–99)

## 2014-08-23 NOTE — Progress Notes (Addendum)
Subjective/Complaints:   Denies new problems Review of Systems -remains limited due to cognitive status   Objective: Vital Signs: Blood pressure 134/88, pulse 79, temperature 98.7 F (37.1 C), temperature source Oral, resp. rate 18, weight 114.533 kg (252 lb 8 oz), SpO2 100.00%. No results found. Results for orders placed during the hospital encounter of 07/30/14 (from the past 72 hour(s))  GLUCOSE, CAPILLARY     Status: Abnormal   Collection Time    08/20/14 11:12 AM      Result Value Ref Range   Glucose-Capillary 119 (*) 70 - 99 mg/dL   Comment 1 Notify RN    GLUCOSE, CAPILLARY     Status: None   Collection Time    08/20/14  4:19 PM      Result Value Ref Range   Glucose-Capillary 96  70 - 99 mg/dL  GLUCOSE, CAPILLARY     Status: Abnormal   Collection Time    08/20/14  9:01 PM      Result Value Ref Range   Glucose-Capillary 147 (*) 70 - 99 mg/dL  GLUCOSE, CAPILLARY     Status: Abnormal   Collection Time    08/21/14  7:49 AM      Result Value Ref Range   Glucose-Capillary 104 (*) 70 - 99 mg/dL  GLUCOSE, CAPILLARY     Status: Abnormal   Collection Time    08/21/14 12:01 PM      Result Value Ref Range   Glucose-Capillary 102 (*) 70 - 99 mg/dL  GLUCOSE, CAPILLARY     Status: None   Collection Time    08/21/14  4:58 PM      Result Value Ref Range   Glucose-Capillary 80  70 - 99 mg/dL  GLUCOSE, CAPILLARY     Status: Abnormal   Collection Time    08/21/14  9:29 PM      Result Value Ref Range   Glucose-Capillary 114 (*) 70 - 99 mg/dL  GLUCOSE, CAPILLARY     Status: None   Collection Time    08/22/14  7:41 AM      Result Value Ref Range   Glucose-Capillary 88  70 - 99 mg/dL  GLUCOSE, CAPILLARY     Status: Abnormal   Collection Time    08/22/14 11:23 AM      Result Value Ref Range   Glucose-Capillary 111 (*) 70 - 99 mg/dL  GLUCOSE, CAPILLARY     Status: None   Collection Time    08/22/14  4:33 PM      Result Value Ref Range   Glucose-Capillary 95  70 - 99 mg/dL   GLUCOSE, CAPILLARY     Status: Abnormal   Collection Time    08/22/14  9:07 PM      Result Value Ref Range   Glucose-Capillary 128 (*) 70 - 99 mg/dL  GLUCOSE, CAPILLARY     Status: None   Collection Time    08/23/14  6:59 AM      Result Value Ref Range   Glucose-Capillary 90  70 - 99 mg/dL     HEENT: neck clean. Trach site closed Cardio: RRR and no murmur Resp: CTA B/L and unlabored, clear.  GI: BS positive and PEG site clean. Extremity:  Bilateral pedal edema trace to 1+--improved Skin:   Intact other than above Neuro:   Initiates more. Better attention/awareness.  Abnormal Sensory reduced on left difficult to assess secondary to MS, Abnormal Motor 4 LUE, 3 to 3+ RLE, 2 to 3 LLE,  5/5 RUE grossly --quicker activtation Musc/Skel:  Normal GEN NAD   Assessment/Plan: 1. Functional deficits secondary to left Thalamic ICH, Hydrocephalus with Right  shunt which require 3+ hours per day of interdisciplinary therapy in a comprehensive inpatient rehab setting. Physiatrist is providing close team supervision and 24 hour management of active medical problems listed below. Physiatrist and rehab team continue to assess barriers to discharge/monitor patient progress toward functional and medical goals.  FIM: FIM - Bathing Bathing Steps Patient Completed: Chest;Right Arm;Left Arm;Abdomen Bathing: 2: Max-Patient completes 3-4 41f 10 parts or 25-49%  FIM - Upper Body Dressing/Undressing Upper body dressing/undressing steps patient completed: Thread/unthread right sleeve of pullover shirt/dresss;Thread/unthread left sleeve of pullover shirt/dress;Put head through opening of pull over shirt/dress;Pull shirt over trunk Upper body dressing/undressing: 5: Supervision: Safety issues/verbal cues FIM - Lower Body Dressing/Undressing Lower body dressing/undressing steps patient completed: Pull pants up/down Lower body dressing/undressing: 1: Total-Patient completed less than 25% of tasks  FIM -  Toileting Toileting steps completed by patient: Adjust clothing prior to toileting Toileting Assistive Devices: Grab bar or rail for support Toileting: 1: Two helpers  FIM - Radio producer Devices: Recruitment consultant Transfers: 4-To toilet/BSC: Min A (steadying Pt. > 75%);1-Two helpers  FIM - Control and instrumentation engineer Devices: Arm rests;Bed rails;Walker Bed/Chair Transfer: 4: Sit > Supine: Min A (steadying pt. > 75%/lift 1 leg);2: Bed > Chair or W/C: Max A (lift and lower assist);2: Chair or W/C > Bed: Max A (lift and lower assist)  FIM - Locomotion: Wheelchair Locomotion: Wheelchair: 0: Activity did not occur FIM - Locomotion: Ambulation Locomotion: Ambulation Assistive Devices: Administrator Ambulation/Gait Assistance: 4: Min assist Locomotion: Ambulation: 4: Travels 150 ft or more with minimal assistance (Pt.>75%)  Comprehension Comprehension Mode: Auditory Comprehension: 3-Understands basic 50 - 74% of the time/requires cueing 25 - 50%  of the time  Expression Expression Mode: Verbal Expression Assistive Devices: 6-Talk trach valve Expression: 3-Expresses basic 50 - 74% of the time/requires cueing 25 - 50% of the time. Needs to repeat parts of sentences.  Social Interaction Social Interaction Mode: Asleep Social Interaction: 3-Interacts appropriately 50 - 74% of the time - May be physically or verbally inappropriate.  Problem Solving Problem Solving Mode: Asleep Problem Solving: 2-Solves basic 25 - 49% of the time - needs direction more than half the time to initiate, plan or complete simple activities  Memory Memory Mode: Asleep Memory: 2-Recognizes or recalls 25 - 49% of the time/requires cueing 51 - 75% of the time  Medical Problem List and Plan:  1. Functional deficits secondary to Left thalamic bleed with hydrocephalus.  2. DVT Prophylaxis/Anticoagulation/has IVCF: Pharmaceutical: Lovenox-  -TEDs for edema,  elevation  -hx of dvt  3. Chronic B-knee/Pain Management: Prn oxycodone.   Voltaren gel for knees.  -poorly-compliant pain patient in my practice 4. Mood: Monitor as mentation improves.  5. Neuropsych: This patient is not capable of making decisions on his own behalf.   -continue low dose ritalin---will not push higher given CV history 6. Skin/Wound Care:   Continue air mattress overlay.   7. DM type 2: Will monitor BS with ac/hs checks. Continue Levemir bid and adjust as indicated.  -reasonable control still  8. Enterobacter tracheobronchitis: abx completed  -decannulated   7. HTN/CAD:  -metoprolol bid  -added HCTZ to assist with edema as well as bp---has helped  -increased norvasc to 10mg  8. ABLA: on iron supplement.   9. Dysphagia: on d1 thins, eating well  -off  TF   LOS (Days) 24 A FACE TO FACE EVALUATION WAS PERFORMED  SWARTZ,ZACHARY T 08/23/2014, 7:41 AM

## 2014-08-23 NOTE — Progress Notes (Addendum)
Physical Therapy Session Note  Patient Details  Name: Tony Davis MRN: [id number] Date of Birth: 08-Feb-1961  Today's Date: 08/23/2014 PT Individual Time: 1000-1115 PT Individual Time Calculation (min): 75 min   Short Term Goals: Week 4:  PT Short Term Goal 1 (Week 4): Pt to perform bed mobility with min Ax1person, 50% of time PT Short Term Goal 2 (Week 4): Pt to perform bed<>w/c transfers with min Ax1person, 50% of time PT Short Term Goal 3 (Week 4): Pt to propel w/c 100' with min Ax1person, 50% of time PT Short Term Goal 4 (Week 4): Pt to ambulate 150' with min Ax1 person, 50% of time PT Short Term Goal 5 (Week 4): Pt to negotiate up/down 5 steps w/ B rail and min Ax1person, 50% of time  Skilled Therapeutic Interventions/Progress Updates:  1:1. Pt received sitting in w/c, ready for therapy. Focus this session on sustained>selective attention, initiation, functional endurance, safety during functional ambulation and transfers and balance. Pt req overall mod-max cueing throughout session for attention, initiation and orientation. Pt req min A for donning shirt and total A for donning shoes at start of session. Pt propelling w/c 100'x1 w/ B LE and supervision, req significantly increased time due to distractibility in busy hallway environment. Pt amb 200'x2 w/ RW and min A on hard level and carpeted surfaces, but req min-mod R HHA 50'x2 due to LOB when engaged in conversation with people in hallway. Pt able to demonstrate bed mobility in a standard bed at supervision level, min A for completion of car t/f. Pt req mod-max A to maintain standing balance while shooting basketball due to LOBs resulting from environmental distractions, therapist safely assisting pt to sitting 2x. Pt left sitting in room at end of session w/ all needs in reach and quick release belt in place.  RN and nurse tech made aware that pt safe to sit in room by   Therapy Documentation Precautions:  Precautions Precautions:  Fall;Other (comment) Precaution Comments: PEG/abdominal precautions, trach, Bilateral thigh high TED hose  Restrictions Weight Bearing Restrictions: No Pain: Pain Assessment Pain Assessment: No/denies pain  See FIM for current functional status  Therapy/Group: Individual Therapy  Gilmore Laroche 08/23/2014, 12:19 PM

## 2014-08-23 NOTE — Progress Notes (Signed)
Speech Language Pathology Weekly Progress and Session Note  Patient Details  Name: Tony Davis MRN: [id number] Date of Birth: 06/03/1961  Beginning of progress report period: August 16, 2014 End of progress report period: August 23, 2014  Today's Date: 08/23/2014 SLP Individual Time: 0800-0900 SLP Individual Time Calculation (min): 60 min  Short Term Goals: Week 3: SLP Short Term Goal 1 (Week 3): Patient will sustain attention to basic self-care task for ~2 minutes with Max multimodal cues.  SLP Short Term Goal 1 - Progress (Week 3): Met SLP Short Term Goal 2 (Week 3): Patient will demonstrate use of call bell with Max multimodal cues. SLP Short Term Goal 2 - Progress (Week 3): Not met SLP Short Term Goal 3 (Week 3): Patient will follow basic, 1-step directions during self-care tasks with Mod multimodal cues. SLP Short Term Goal 3 - Progress (Week 3): Met SLP Short Term Goal 4 (Week 3): Patient will initiate verbal response to yes/no questions with Mod multimodal cues. SLP Short Term Goal 4 - Progress (Week 3): Met SLP Short Term Goal 5 (Week 3): Patient will utilize swallowing compensatory strategies with current diet to minimize overt s/s of aspiration with Min A multimodal cues.  SLP Short Term Goal 5 - Progress (Week 3): Met SLP Short Term Goal 6 (Week 3): Patient will initiate functional tasks with Mod A multimodal cues.  SLP Short Term Goal 6 - Progress (Week 3): Not met    New Short Term Goals: Week 4: SLP Short Term Goal 1 (Week 4): Patient will sustain attention to basic self-care task for ~5 minutes with Max multimodal cues.  SLP Short Term Goal 2 (Week 4): Patient will demonstrate use of call bell with Max multimodal cues. SLP Short Term Goal 3 (Week 4): Patient will follow basic, 1-step directions during self-care tasks with Min multimodal cues. SLP Short Term Goal 4 (Week 4): Patient will initiate verbal response to yes/no questions with Min multimodal cues. SLP  Short Term Goal 5 (Week 4): Patient will initiate functional tasks with Mod A multimodal cues.  SLP Short Term Goal 6 (Week 4): Patient will utilize swallowing compensatory strategies with current diet to minimize overt s/s of aspiration with supervision multimodal cues.   Weekly Progress Updates: Patient has made functional gains and has met 4 of 6 short term goals this reporting period due to increased ability to follow 1 step commands, increased sustained attention, increased verbal responses and increased swallowing function.  Currently, patient requires Max assist for initiation of functional tasks and sustained attention and Mod A for following 1-step commands and overall verbal expression.   Patient continues to require total A for orientation, working memory, functional problem solving, safety awareness and intellectual awareness. Patient is currently consuming Dys. 1 textures with thin liquids without overt s/s of aspiration and requires Mod A multimodal cues for utilization of swallowing compensatory strategies.  Next reporting period will focus on potential upgrade to Dys. 2 textures. Patient/family education ongoing. Patient would benefit from continued skilled SLP intervention to maximize his cognitive-linguistic and swallowing function in order to maximize his functional independence prior to discharge home with 24/7 assist. Continue with current plan of care.    Intensity: Minumum of 1-2 x/day, 30 to 90 minutes Duration/Length of Stay: 1 week Treatment/Interventions: Cognitive remediation/compensation;Speech/Language facilitation;Internal/external aids;Patient/family education;Dysphagia/aspiration precaution training;Functional tasks;Cueing hierarchy   Daily Session Skilled Therapeutic Interventions: Skilled treatment session focused on cognitive-linguistic and dysphagia goals. SLP facilitated session by initially providing Max A verbal and visual  cues for initiation of self-feeding  during a breakfast meal of Dys. 1 textures with thin liquids. Pt required Mod verbal cues for utilization of small bites but did not demonstrate any overt s/s of aspiration. Patient also required Max A multimodal cues for selective attention to self-feeding in a moderately distracting environment. Patient utilized verbal expression for responses instead of gestures with ~75% of responses with decreased language of confusion and required Mod A verbal cues to increase vocal intensity for improved speech intelligibility. Continue with current plan of care.       FIM:  Comprehension Comprehension Mode: Auditory Comprehension: 3-Understands basic 50 - 74% of the time/requires cueing 25 - 50%  of the time Expression Expression Mode: Verbal Expression: 3-Expresses basic 50 - 74% of the time/requires cueing 25 - 50% of the time. Needs to repeat parts of sentences. Social Interaction Social Interaction: 3-Interacts appropriately 50 - 74% of the time - May be physically or verbally inappropriate. Problem Solving Problem Solving: 2-Solves basic 25 - 49% of the time - needs direction more than half the time to initiate, plan or complete simple activities Memory Memory: 2-Recognizes or recalls 25 - 49% of the time/requires cueing 51 - 75% of the time FIM - Eating Eating Activity: 5: Supervision/cues Pain Pain Assessment Pain Assessment: No/denies pain  Therapy/Group: Individual Therapy  Jeferson Boozer, [place] 08/23/2014, 12:39 PM

## 2014-08-23 NOTE — Progress Notes (Signed)
Occupational Therapy Session Note  Patient Details  Name: Tony Davis MRN: [id number] Date of Birth: 02-22-1961  Today's Date: 08/23/2014 OT Individual Time: 0915-1000  OT Individual Time Calculation (min): 45 min    Short Term Goals: Week 3:  OT Short Term Goal 1 (Week 3): PT will demonstrate sustained attention to functional task for 3 min with mod cuing OT Short Term Goal 2 (Week 3): Pt will perform LB dressing wtih mod A  OT Short Term Goal 3 (Week 3): Pt will self feed 50% of a meal with mod cuing OT Short Term Goal 4 (Week 3): Pt will perform shower transfer with max A  OT Short Term Goal 5 (Week 3): Pt will bathe with mod A at shower level   Skilled Therapeutic Interventions/Progress Updates:  ADL-retraining at shower level with emphasis on transfers, adapted bathing, sequencing, initiation, sustained attention, and dynamic standing balance.   Pt received in his w/c, alert and responsive to planned task.   With setup assist to position w/c within proximity to grab bars and BSC, pt completed stand pivot transfer with only steadying assist (pt =90%) after 3rd attempt to rise from w/c.    Pt washed upper legs and lower arms while seated on BSC and then voided urine and stool.   Pt stood with min lifting assist and attempted to clean his buttocks after BM but required min assist for thoroughness after mild LOB.   Pt sustained bathing tasks for approx 6 minutes with intermittent verbal cues to progress.   Pt then ceased participation with task  and would not attempt any further bathing, requiring assist to wash lower legs, feet, chest, and back.   Pt then completed transfer to w/c to initiate dressing at sink.  After min assist to lace diaper and pants, pt stood at sink to allow assist with lower body dressing.   Pt refused to don shirt and requested gown for covering of upper body.    Pt was assisted with donning TED and socks at end of session.   Pt left in w/c with call light within reach  and safety belt attached.     Therapy Documentation Precautions:  Precautions Precautions: Fall;Other (comment) Precaution Comments: PEG/abdominal precautions, trach, Bilateral thigh high TED hose  Restrictions Weight Bearing Restrictions: No  Pain: Pain Assessment Pain Assessment: No/denies pain  See FIM for current functional status  Therapy/Group: Individual Therapy  [place] 08/23/2014, 11:11 AM

## 2014-08-24 ENCOUNTER — Inpatient Hospital Stay (HOSPITAL_COMMUNITY): Payer: Medicare Other | Admitting: *Deleted

## 2014-08-24 ENCOUNTER — Inpatient Hospital Stay (HOSPITAL_COMMUNITY): Payer: Medicare Other

## 2014-08-24 ENCOUNTER — Inpatient Hospital Stay (HOSPITAL_COMMUNITY): Payer: Medicare Other | Admitting: Speech Pathology

## 2014-08-24 DIAGNOSIS — M1710 Unilateral primary osteoarthritis, unspecified knee: Secondary | ICD-10-CM

## 2014-08-24 DIAGNOSIS — IMO0002 Reserved for concepts with insufficient information to code with codable children: Secondary | ICD-10-CM

## 2014-08-24 DIAGNOSIS — J9600 Acute respiratory failure, unspecified whether with hypoxia or hypercapnia: Secondary | ICD-10-CM

## 2014-08-24 DIAGNOSIS — Z5189 Encounter for other specified aftercare: Secondary | ICD-10-CM

## 2014-08-24 DIAGNOSIS — I619 Nontraumatic intracerebral hemorrhage, unspecified: Secondary | ICD-10-CM

## 2014-08-24 DIAGNOSIS — I10 Essential (primary) hypertension: Secondary | ICD-10-CM

## 2014-08-24 LAB — GLUCOSE, CAPILLARY
GLUCOSE-CAPILLARY: 99 mg/dL (ref 70–99)
Glucose-Capillary: 104 mg/dL — ABNORMAL HIGH (ref 70–99)
Glucose-Capillary: 95 mg/dL (ref 70–99)

## 2014-08-24 MED ORDER — HYDROCHLOROTHIAZIDE 25 MG PO TABS
25.0000 mg | ORAL_TABLET | Freq: Every day | ORAL | Status: DC
  Administered 2014-08-24 – 2014-09-07 (×15): 25 mg via ORAL
  Filled 2014-08-24 (×17): qty 1

## 2014-08-24 MED ORDER — FERROUS SULFATE 325 (65 FE) MG PO TABS
325.0000 mg | ORAL_TABLET | Freq: Two times a day (BID) | ORAL | Status: DC
  Administered 2014-08-24 – 2014-09-07 (×27): 325 mg via ORAL
  Filled 2014-08-24 (×32): qty 1

## 2014-08-24 NOTE — Progress Notes (Signed)
Subjective/Complaints:   Resting comfortably. Denies new problems this morning. Very alert Review of Systems -remains limited due to cognitive status   Objective: Vital Signs: Blood pressure 134/89, pulse 84, temperature 98.1 F (36.7 C), temperature source Oral, resp. rate 17, weight 114.533 kg (252 lb 8 oz), SpO2 98.00%. No results found. Results for orders placed during the hospital encounter of 07/30/14 (from the past 72 hour(s))  GLUCOSE, CAPILLARY     Status: Abnormal   Collection Time    08/21/14  7:49 AM      Result Value Ref Range   Glucose-Capillary 104 (*) 70 - 99 mg/dL  GLUCOSE, CAPILLARY     Status: Abnormal   Collection Time    08/21/14 12:01 PM      Result Value Ref Range   Glucose-Capillary 102 (*) 70 - 99 mg/dL  GLUCOSE, CAPILLARY     Status: None   Collection Time    08/21/14  4:58 PM      Result Value Ref Range   Glucose-Capillary 80  70 - 99 mg/dL  GLUCOSE, CAPILLARY     Status: Abnormal   Collection Time    08/21/14  9:29 PM      Result Value Ref Range   Glucose-Capillary 114 (*) 70 - 99 mg/dL  GLUCOSE, CAPILLARY     Status: None   Collection Time    08/22/14  7:41 AM      Result Value Ref Range   Glucose-Capillary 88  70 - 99 mg/dL  GLUCOSE, CAPILLARY     Status: Abnormal   Collection Time    08/22/14 11:23 AM      Result Value Ref Range   Glucose-Capillary 111 (*) 70 - 99 mg/dL  GLUCOSE, CAPILLARY     Status: None   Collection Time    08/22/14  4:33 PM      Result Value Ref Range   Glucose-Capillary 95  70 - 99 mg/dL  GLUCOSE, CAPILLARY     Status: Abnormal   Collection Time    08/22/14  9:07 PM      Result Value Ref Range   Glucose-Capillary 128 (*) 70 - 99 mg/dL  GLUCOSE, CAPILLARY     Status: None   Collection Time    08/23/14  6:59 AM      Result Value Ref Range   Glucose-Capillary 90  70 - 99 mg/dL  GLUCOSE, CAPILLARY     Status: None   Collection Time    08/23/14 11:47 AM      Result Value Ref Range   Glucose-Capillary 98  70  - 99 mg/dL  GLUCOSE, CAPILLARY     Status: Abnormal   Collection Time    08/23/14  4:27 PM      Result Value Ref Range   Glucose-Capillary 104 (*) 70 - 99 mg/dL  GLUCOSE, CAPILLARY     Status: Abnormal   Collection Time    08/23/14  8:12 PM      Result Value Ref Range   Glucose-Capillary 136 (*) 70 - 99 mg/dL  GLUCOSE, CAPILLARY     Status: Abnormal   Collection Time    08/24/14  6:39 AM      Result Value Ref Range   Glucose-Capillary 104 (*) 70 - 99 mg/dL     HEENT: neck clean. Trach site closed Cardio: RRR and no murmur Resp: CTA B/L and unlabored, clear.  GI: BS positive and PEG site clean. Extremity:  Bilateral pedal edema trace to 1+--improved Skin:  Intact other than above Neuro:   Initiates more. Better attention/awareness.  Abnormal Sensory reduced on left difficult to assess secondary to MS, Abnormal Motor 4 LUE, 3 to 3+ RLE, 2 to 3 LLE, 5/5 RUE grossly --quicker activtation Musc/Skel:  Normal GEN NAD   Assessment/Plan: 1. Functional deficits secondary to left Thalamic ICH, Hydrocephalus with Right  shunt which require 3+ hours per day of interdisciplinary therapy in a comprehensive inpatient rehab setting. Physiatrist is providing close team supervision and 24 hour management of active medical problems listed below. Physiatrist and rehab team continue to assess barriers to discharge/monitor patient progress toward functional and medical goals.  FIM: FIM - Bathing Bathing Steps Patient Completed: Abdomen;Right upper leg;Left upper leg;Right Arm;Left Arm;Buttocks;Front perineal area Bathing: 3: Mod-Patient completes 5-7 81f 10 parts or 50-74%  FIM - Upper Body Dressing/Undressing Upper body dressing/undressing steps patient completed: Thread/unthread right sleeve of pullover shirt/dresss;Thread/unthread left sleeve of pullover shirt/dress;Put head through opening of pull over shirt/dress;Pull shirt over trunk Upper body dressing/undressing: 0: Wears gown/pajamas-no  public clothing (refused shirt) FIM - Lower Body Dressing/Undressing Lower body dressing/undressing steps patient completed: Pull pants up/down Lower body dressing/undressing: 1: Total-Patient completed less than 25% of tasks  FIM - Toileting Toileting steps completed by patient: Adjust clothing prior to toileting Toileting Assistive Devices: Grab bar or rail for support Toileting: 1: Total-Patient completed zero steps, helper did all 3  FIM - Radio producer Devices: Recruitment consultant Transfers: 4-To toilet/BSC: Min A (steadying Pt. > 75%);1-Two helpers  FIM - Control and instrumentation engineer Devices: Environmental consultant;Arm rests Bed/Chair Transfer: 3: Chair or W/C > Bed: Mod A (lift or lower assist);3: Bed > Chair or W/C: Mod A (lift or lower assist);5: Supine > Sit: Supervision (verbal cues/safety issues);5: Sit > Supine: Supervision (verbal cues/safety issues)  FIM - Locomotion: Wheelchair Locomotion: Wheelchair: 2: Travels 50 - 149 ft with supervision, cueing or coaxing FIM - Locomotion: Ambulation Locomotion: Ambulation Assistive Devices: Walker - Rolling;Other (comment) (R HHA) Ambulation/Gait Assistance: 4: Min assist Locomotion: Ambulation: 2: Travels 50 - 149 ft with moderate assistance (Pt: 50 - 74%)  Comprehension Comprehension Mode: Auditory Comprehension: 3-Understands basic 50 - 74% of the time/requires cueing 25 - 50%  of the time  Expression Expression Mode: Verbal Expression Assistive Devices: 6-Talk trach valve Expression: 2-Expresses basic 25 - 49% of the time/requires cueing 50 - 75% of the time. Uses single words/gestures.  Social Interaction Social Interaction Mode: Asleep Social Interaction: 2-Interacts appropriately 25 - 49% of time - Needs frequent redirection.  Problem Solving Problem Solving Mode: Asleep Problem Solving: 2-Solves basic 25 - 49% of the time - needs direction more than half the time to initiate,  plan or complete simple activities  Memory Memory Mode: Asleep Memory: 1-Recognizes or recalls less than 25% of the time/requires cueing greater than 75% of the time  Medical Problem List and Plan:  1. Functional deficits secondary to Left thalamic bleed with hydrocephalus.  2. DVT Prophylaxis/Anticoagulation/has IVCF: Pharmaceutical: Lovenox-  -TEDs for edema, elevation  -hx of dvt  3. Chronic B-knee/Pain Management: Prn oxycodone.   Voltaren gel for knees.  -poorly-compliant pain patient in my practice 4. Mood: Monitor as mentation improves.  5. Neuropsych: This patient is not capable of making decisions on his own behalf.   -continue low dose ritalin---will not push higher given CV history 6. Skin/Wound Care:   Continue air mattress overlay.   7. DM type 2: Will monitor BS with ac/hs checks. Continue Levemir bid  and adjust as indicated.  -reasonable control still  8. Enterobacter tracheobronchitis: abx completed  -decannulated   7. HTN/CAD:  -metoprolol bid  -added HCTZ to assist with edema as well as bp---has helped  -increased norvasc to 10mg  8. ABLA: on iron supplement.   9. Dysphagia: on d1 thins, eating well  -off TF   LOS (Days) 25 A FACE TO FACE EVALUATION WAS PERFORMED  SWARTZ,ZACHARY T 08/24/2014, 7:47 AM

## 2014-08-24 NOTE — Progress Notes (Signed)
The skilled treatment note has been reviewed and SLP is in agreement.  Yorel Redder, M.A., CCC-SLP  [phone]   

## 2014-08-24 NOTE — Progress Notes (Signed)
Physical Therapy Session Note  Patient Details  Name: Tony Davis MRN: [id number] Date of Birth: 1961-02-18  Today's Date: 08/24/2014 PT Individual Time: 1330-1430 PT Individual Time Calculation (min): 60 min   Short Term Goals: Week 4:  PT Short Term Goal 1 (Week 4): Pt to perform bed mobility with min Ax1person, 50% of time PT Short Term Goal 2 (Week 4): Pt to perform bed<>w/c transfers with min Ax1person, 50% of time PT Short Term Goal 3 (Week 4): Pt to propel w/c 100' with min Ax1person, 50% of time PT Short Term Goal 4 (Week 4): Pt to ambulate 150' with min Ax1 person, 50% of time PT Short Term Goal 5 (Week 4): Pt to negotiate up/down 5 steps w/ B rail and min Ax1person, 50% of time  Skilled Therapeutic Interventions/Progress Updates:  1:1. Pt received sitting in w/c, ready for therapy. Focus this session on functional endurance, sustained>selective, attention, initiation, safety during functional mobility. Pt req mod cues for initiation of all transfers and mobility this session, consistent max multimodal cueing for attention throughout session. Pt amb 225'x1 w/ mod A and R UE over therapist's shoulders. Pt amb 100'x1 and 175'x1 w/ RW and min guard-min A. Therapist facilitating increased pace and demonstrating mild LOB req A for correction due to distractibility in busy hallway environments. Pt engaged in shooting basketball at hoop with mod A to target standing balance and sustained attention to counting number of shots in quiet ortho gym, req max cues for counting. Pt shooting at seated level with therapist attempting to block shot to encourage timing and problem solving. Pt utilized NuStep for B UE/LE strength and endurance, level 5x97min with good tolerance. Pt req intermittent cueing to maintain pace in 30-40s. Pt left sitting in recliner at end of session w/ all needs in reach, quick release belt in place and quick release belt in place.   Therapy Documentation Precautions:   Precautions Precautions: Fall;Other (comment) Precaution Comments: PEG/abdominal precautions, trach, Bilateral thigh high TED hose  Restrictions Weight Bearing Restrictions: No   Pain: Pain Assessment Pain Assessment: No/denies pain  See FIM for current functional status  Therapy/Group: Individual Therapy  Gilmore Laroche 08/24/2014, 2:42 PM

## 2014-08-24 NOTE — Progress Notes (Signed)
Speech Language Pathology Daily Session Note  Patient Details  Name: Tony Davis MRN: [id number] Date of Birth: [birth year]  Today's Date: 08/24/2014 SLP Individual Time: 1100-1200 SLP Individual Time Calculation (min): 60 min  Short Term Goals: Week 4: SLP Short Term Goal 1 (Week 4): Patient will sustain attention to basic self-care task for ~5 minutes with Max multimodal cues.  SLP Short Term Goal 2 (Week 4): Patient will demonstrate use of call bell with Max multimodal cues. SLP Short Term Goal 3 (Week 4): Patient will follow basic, 1-step directions during self-care tasks with Min multimodal cues. SLP Short Term Goal 4 (Week 4): Patient will initiate verbal response to yes/no questions with Min multimodal cues. SLP Short Term Goal 5 (Week 4): Patient will initiate functional tasks with Mod A multimodal cues.  SLP Short Term Goal 6 (Week 4): Patient will utilize swallowing compensatory strategies with current diet to minimize overt s/s of aspiration with supervision multimodal cues.   Skilled Therapeutic Interventions: Skilled treatment session focused on cognitive-linguistic and dysphagia goals. Student facilitated session by providing overall Max A verbal and visual cues for initiation of self-feeding and selective attention to task in a moderately distracting environment during trials of Dys. 2 textures with thin liquids and lunch meal of Dys. 1 textures with thin liquids. Patient required Mod verbal cues for utilization of small bites but did not demonstrate any overt s/s of aspiration and demonstrated efficient mastication throughout session. Therefore, recommend trial tray of Dys. 2 textures with thin liquids and full supervision prior to upgrade.  Student also facilitated session by providing Mod A for utilization of verbal expression for responses instead of gestures and increased vocal intensity for improved speech intelligibility. Patient left upright in w/c with quick release belt  in place and nurse tech to complete lunch meal.    FIM:  Comprehension Comprehension Mode: Auditory Comprehension: 4-Understands basic 75 - 89% of the time/requires cueing 10 - 24% of the time Expression Expression Mode: Verbal Expression: 3-Expresses basic 50 - 74% of the time/requires cueing 25 - 50% of the time. Needs to repeat parts of sentences. Social Interaction Social Interaction: 3-Interacts appropriately 50 - 74% of the time - May be physically or verbally inappropriate. Problem Solving Problem Solving: 2-Solves basic 25 - 49% of the time - needs direction more than half the time to initiate, plan or complete simple activities Memory Memory: 2-Recognizes or recalls 25 - 49% of the time/requires cueing 51 - 75% of the time FIM - Eating Eating Activity: 4: Help with managing cup/glass;4: Helper occasionally scoops food on utensil;5: Needs verbal cues/supervision  Pain Pain Assessment Pain Assessment: No/denies pain  Therapy/Group: Individual Therapy  Adelfa Lozito 08/24/2014, 1:49 PM

## 2014-08-24 NOTE — Progress Notes (Signed)
Occupational Therapy Session Note  Patient Details  Name: Tony Davis MRN: [id number] Date of Birth: [birth year]  Today's Date: 08/24/2014 OT Individual Time: 0900-1000 OT Individual Time Calculation (min): 60 min    Short Term Goals: Week 3:  OT Short Term Goal 1 (Week 3): PT will demonstrate sustained attention to functional task for 3 min with mod cuing OT Short Term Goal 2 (Week 3): Pt will perform LB dressing wtih mod A  OT Short Term Goal 3 (Week 3): Pt will self feed 50% of a meal with mod cuing OT Short Term Goal 4 (Week 3): Pt will perform shower transfer with max A  OT Short Term Goal 5 (Week 3): Pt will bathe with mod A at shower level   Skilled Therapeutic Interventions/Progress Updates:    Pt resting in bed upon arrival and agreeable to BADL retraining including bathing at shower level and dressing with sit<>stand from w/c.  Pt required max verbal cues to initiate and sequence all tasks.  Pt required max verbal cues to redirect when patient perseverated on bathing body parts.  Pt required extra time to initiate all tasks.  Pt required max multimodal cues and extra time for stand pivot transfers (bed->w/c and w/c<>shower seat). Pt stood at sink and assisted in pulling up pants.  Focus on activity tolerance, dynamic standing balance, transfers, task initiation, attention to task, and safety awareness.  Therapy Documentation Precautions:  Precautions Precautions: Fall;Other (comment) Precaution Comments: PEG/abdominal precautions, trach, Bilateral thigh high TED hose  Restrictions Weight Bearing Restrictions: No Pain: Pain Assessment Pain Assessment: No/denies pain  See FIM for current functional status  Therapy/Group: Individual Therapy  Leroy Libman 08/24/2014, 10:03 AM

## 2014-08-25 ENCOUNTER — Encounter (HOSPITAL_COMMUNITY): Payer: Medicare Other

## 2014-08-25 ENCOUNTER — Inpatient Hospital Stay (HOSPITAL_COMMUNITY): Payer: Medicare Other | Admitting: Speech Pathology

## 2014-08-25 ENCOUNTER — Inpatient Hospital Stay (HOSPITAL_COMMUNITY): Payer: Medicare Other

## 2014-08-25 DIAGNOSIS — IMO0002 Reserved for concepts with insufficient information to code with codable children: Secondary | ICD-10-CM

## 2014-08-25 DIAGNOSIS — M1710 Unilateral primary osteoarthritis, unspecified knee: Secondary | ICD-10-CM

## 2014-08-25 DIAGNOSIS — I10 Essential (primary) hypertension: Secondary | ICD-10-CM

## 2014-08-25 DIAGNOSIS — I619 Nontraumatic intracerebral hemorrhage, unspecified: Secondary | ICD-10-CM

## 2014-08-25 DIAGNOSIS — Z5189 Encounter for other specified aftercare: Secondary | ICD-10-CM

## 2014-08-25 DIAGNOSIS — J9600 Acute respiratory failure, unspecified whether with hypoxia or hypercapnia: Secondary | ICD-10-CM

## 2014-08-25 LAB — GLUCOSE, CAPILLARY
GLUCOSE-CAPILLARY: 126 mg/dL — AB (ref 70–99)
Glucose-Capillary: 97 mg/dL (ref 70–99)
Glucose-Capillary: 98 mg/dL (ref 70–99)
Glucose-Capillary: 110 mg/dL — ABNORMAL HIGH (ref 70–99)

## 2014-08-25 NOTE — Progress Notes (Signed)
Occupational Therapy Session Note  Patient Details  Name: Tony Davis MRN: [id number] Date of Birth: 1961/12/05  Today's Date: 08/25/2014 OT Individual Time: 0700-0800 OT Individual Time Calculation (min): 60 min    Short Term Goals: Week 3:  OT Short Term Goal 1 (Week 3): PT will demonstrate sustained attention to functional task for 3 min with mod cuing OT Short Term Goal 2 (Week 3): Pt will perform LB dressing wtih mod A  OT Short Term Goal 3 (Week 3): Pt will self feed 50% of a meal with mod cuing OT Short Term Goal 4 (Week 3): Pt will perform shower transfer with max A  OT Short Term Goal 5 (Week 3): Pt will bathe with mod A at shower level   Skilled Therapeutic Interventions/Progress Updates:    Pt resting in bed upon arrival but agreeable to participate in bathing and dressing tasks this morning.  Pt greeted therapist appropriately and was able to recall he was in the hospital but could not remember the name of the hospital.  Pt initiated sitting EOB requiring min A and stand pivot transfer to w/c with mod A.  Pt initiated all bathing tasks requiring min verbal cues for thoroughness.  Pt transferred shower seate->w/c with supervision.  Pt initiated all dressing tasks when presented with clothing.  Focus on task initiation, attention to task, sequencing, sit<>stand, standing balance, activity tolerance, and safety awareness.  Therapy Documentation Precautions:  Precautions Precautions: Fall;Other (comment) Precaution Comments: PEG/abdominal precautions, trach, Bilateral thigh high TED hose  Restrictions Weight Bearing Restrictions: No Pain: Pain Assessment Pain Assessment: No/denies pain  See FIM for current functional status  Therapy/Group: Individual Therapy  Leroy Libman 08/25/2014, 8:00 AM

## 2014-08-25 NOTE — Progress Notes (Signed)
Occupational Therapy Weekly Progress Note  Patient Details  Name: Tony Davis MRN: [id number] Date of Birth: [birth year]  Beginning of progress report period: August 16, 2014 End of progress report period: August 25, 2014   Patient has met 4 of 4 short term goals.  Pt has made steady progress with BADLs during this past week. Pt continues to require max verbal cues and extra time to initiate tasks.  Pt requires max verbal cues to redirect to task when patient stops performing task.  Pt continues to perseverate on washing body parts and requires max verbal cues to redirect.  Pt does not initiate sit<>stand consistently and can require max a.  When patient initiates performs sit<>stand with steady A.  Patient continues to demonstrate the following deficits: Impaired cognition (initiation, sequencing, termination), dynamic standing balance, transfers, activity tolerance, and functional communication and therefore will continue to benefit from skilled OT intervention to enhance overall performance with BADL.  Patient progressing toward long term goals..  Continue plan of care.  OT Short Term Goals Week 3:  OT Short Term Goal 1 (Week 3): PT will demonstrate sustained attention to functional task for 3 min with mod cuing OT Short Term Goal 1 - Progress (Week 3): Met OT Short Term Goal 2 (Week 3): Pt will perform LB dressing wtih mod A  OT Short Term Goal 2 - Progress (Week 3): Met OT Short Term Goal 3 (Week 3): Pt will self feed 50% of a meal with mod cuing OT Short Term Goal 3 - Progress (Week 3): Progressing toward goal OT Short Term Goal 4 (Week 3): Pt will perform shower transfer with max A  OT Short Term Goal 4 - Progress (Week 3): Met OT Short Term Goal 5 (Week 3): Pt will bathe with mod A at shower level  OT Short Term Goal 5 - Progress (Week 3): Met Week 4:  OT Short Term Goal 1 (Week 4): Pt will perform tub/shower transfer with min A OT Short Term Goal 2 (Week 4): Pt will  perform bathing tasks with min A OT Short Term Goal 3 (Week 4): Pt will perform toilet transfers with min A       Therapy Documentation Precautions:  Precautions Precautions: Fall;Other (comment) Precaution Comments: PEG/abdominal precautions, trach, Bilateral thigh high TED hose  Restrictions Weight Bearing Restrictions: No  Vital Signs: Therapy Vitals Temp: 98.5 F (36.9 C) Temp src: Oral Pulse Rate: 86 Resp: 17 BP: 129/87 mmHg Patient Position (if appropriate): Sitting Oxygen Therapy SpO2: 100 % O2 Device: None (Room air)  Pain: Pain Assessment Pain Assessment: No/denies pain  See FIM for current functional status   Leroy Libman 08/25/2014, 2:44 PM

## 2014-08-25 NOTE — Progress Notes (Signed)
The skilled treatment note has been reviewed and SLP is in agreement.  Carriann Hesse, M.A., CCC-SLP  [phone]   

## 2014-08-25 NOTE — Progress Notes (Signed)
NUTRITION FOLLOW-UP  DOCUMENTATION CODES Per approved criteria  -Obesity Unspecified   INTERVENTION: -Continue Magic cup TID between meals, each supplement provides 290 kcal and 9 grams of protein.  -Continue Ensure Complete po BID, each supplement provides 350 kcal and 13 grams of protein.  -Encourage PO intake.  NUTRITION Dx: Inadequate oral intake related to inability to eat as evidenced by NPO status; Resolved.  NEW NUTRITION Dx: Inadequate oral intake now related to dysphagia and cognition as evidenced by dysphagia 1 diet; improving  Goal: Pt to meet >/= 90% of their estimated nutrition needs, met  Monitor:  PO intake, labs, weights, I/O's  53 y.o. male  Admitting Dx: Basal Ganglia Hemorrhage  ASSESSMENT: Tony Davis is a 53 year old male with h/o DM type 2, CAD, OSA, chronic pain bilateral knees, CVA '11, PFO-on chronic coumadin who was admitted to OSH on 06/13/14 with left basal ganglia hemorrhage with intraventricular extension and hydrocephalus requiring right crani for evacuation as well as VP shunt placement Hospital course complicated by acute respiratory failure requiring tracheostomy as well as PEG placement, BLE DVT requiring IVC placement as well as fevers due to enterobacter PNA. Patient with subsequent right hemiparesis, lethargy, nonverbal, requiring noxious stimuli for arousal, difficulty tracking, dysconjugate gaze as well as difficulty following commands  8/22-Pt endorsed decreased appetite, however was unable to determine when decreased PO intake began -Is currently receiving feeding assistance, RD viewed breakfast tray with <25% completion. Pt had only consumed <50% of MagicCup protein supplement -Weight appears to have maintained stable per previous medical records, fluctuates between 270-278 lbs in past 3 months (2.8% body weight, non-severe for time frame) -Receiving Vital 1.5 at nocturnal feeds of 6pm-6am at 65 ml/hr w/Pro-stat TID. MD requesting  advancing to bolus feeds until PO intake improves -Modified regimen to Vital 1.5 at 250 ml TID between meals  8/24-Dietitian was notified for initiation of full bolus tube feedings. Spoke with RN, pt has been tolerating the bolus tube feeding. Spoke with pt, pt reports having stomach pains, which had started yesterday.  -Pt was receiving bolus tube feeding:Vital 1.5 @ 250 ml TID with 30 ml Prostat via PEG TID. Tube feeding regimen provides 1425 kcal (70% of needs), 96 grams of protein (96% est protein needs), and 573 ml of H2O.  -Increased orders to Vital 1.5 to 450 ml TID and provide 30 ml Prostat per PEG once daily to provide 2125 kcal, 106 grams of protein, and 1026 ml of H2O.    8/25- Spoke with RN, she reports pt has been tolerating the bolus tube feeding well. Pt did have two large, loose bowel movements last night, however had not had one today. Spoke with pt, pt reports he continues to have abdominal pains. Pains however were not serious. Spoke with PA about current TF regimen, discussed on continuing with current orders.  8/28- Pt reports he no longer has abdominal pains. Per RN, pt has been tolerating his tube feeding well.  9/4- Spoke with RN, pt was been tolerating his tube feeding well. She reports however that he has been having loose stools once a day. Pt with no stomach pains.Pt is receiving bolus tube feedings of Vital 1.5 formula at 450 ml TID with 30 ml Prostat once daily per PEG providing 2125 kcal, 106 grams of protein, and 1026 ml of H2O.   9/11-Calorie count- Day 1-Total intake:  1484 kcal (74% of minimum estimated needs)  71 protein (71% of minimum estimated needs) Day 2-Total intake:  1941  kcal (97% of minimum estimated needs)  79 protein (79% of minimum estimated needs) Pt on dysphagia 1 with thin liquids. Bolus TF was discontinued.  9/16- Spoke with pt, pt reports having a good appetite, however meal completion over the past 2 days have been 0-40%. Prior to the past 2  days, pt's meal completion has been 100%. Pt was encouraged to eat his food at meals. Pt expressed understanding. Pt does report that he has been drinking/eating his oral supplements.  Height: Ht Readings from Last 1 Encounters:  07/30/14 $RemoveB'5\' 11"'iunNCPkW$  (1.803 m)   Weight: Wt Readings from Last 1 Encounters:  08/25/14 258 lb 11.2 oz (117.346 kg)   BMI:  Body mass index is 36.1 kg/(m^2). Class II obesity  Re-Estimated Nutritional Needs: Kcal: 2000-2200 Protein: 100-110 gram Fluid: >/=2000 ml/daily  Skin:+ 1 gen edema  Diet Order: Dysphagia 1 with thin liquids   Intake/Output Summary (Last 24 hours) at 08/25/14 1007 Last data filed at 08/25/14 0800  Gross per 24 hour  Intake    300 ml  Output    500 ml  Net   -200 ml    Last BM: 9/15  Labs:  No results found for this basename: NA, K, CL, CO2, BUN, CREATININE, CALCIUM, MG, PHOS, GLUCOSE,  in the last 168 hours  CBG (last 3)   Recent Labs  08/24/14 1558 08/24/14 2120 08/25/14 0757  GLUCAP 95 99 97    Scheduled Meds: . amLODipine  5 mg Oral Daily  . antiseptic oral rinse  7 mL Mouth Rinse q12n4p  . atorvastatin  20 mg Oral q1800  . chlorhexidine  15 mL Mouth Rinse BID  . diclofenac sodium  2 g Topical QID  . enoxaparin (LOVENOX) injection  40 mg Subcutaneous Q24H  . famotidine  20 mg Oral Daily  . feeding supplement (ENSURE COMPLETE)  237 mL Oral BID  . ferrous sulfate  325 mg Oral BID WC  . free water  50 mL Per Tube q12n4p  . hydrochlorothiazide  25 mg Oral Daily  . insulin aspart  0-5 Units Subcutaneous QHS  . insulin aspart  0-9 Units Subcutaneous TID WC  . insulin detemir  20 Units Subcutaneous BID  . methylphenidate  5 mg Per Tube BID  . metoprolol tartrate  75 mg Oral BID  . saccharomyces boulardii  250 mg Oral BID    Continuous Infusions:   Past Medical History  Diagnosis Date  . GERD (gastroesophageal reflux disease)   . HLD (hyperlipidemia)   . HTN (hypertension)   . Hematuria 8/12  . PFO (patent  foramen ovale)     echo 8/12: EF 55-65%, grade 2 diast dysfnx; +PFO on bubble study;  TEE 4/13: EF normal, atrial septum with suspicion for interatrial septum fenestrations without flow across and few large bubbles noted in LA.  This was not felt to require closure   . CAD (coronary artery disease)     a. s/p promus DES circumflex artery 10/13/10. b. Cath 04/2013: stable disease but possible small vessel disease -Imdur added.  Marland Kitchen DVT (deep venous thrombosis) 2013    "right"   . TIA (transient ischemic attack)     "1-2; both after the one in 02/2010" (09/25/2013)  . OSA on CPAP   . Type II diabetes mellitus     June 2014 AIC 6.2%/notes 09/24/2013; pt denies this hx on 09/25/2013  . Arthritis     "knees" (09/25/2013)  . Rheumatoid arthritis(714.0)   . CVA (cerebral vascular accident)  02/2010    "lost peripheral vision when I had the stroke; some memory issues since" (09/25/2013)    Past Surgical History  Procedure Laterality Date  . Knee arthroscopy Bilateral 2000's    2 on left and 3 on rt  . Tee without cardioversion  03/21/2012    Procedure: TRANSESOPHAGEAL ECHOCARDIOGRAM (TEE);  Surgeon: Fay Records, MD;  Location: [facility] ENDOSCOPY;  Service: Cardiovascular;  Laterality: N/A;  . Coronary angioplasty with stent placement  2012    "1" (09/24/2013)   Kallie Locks, MS, Provisional LDN Pager # [phone] After hours/ weekend pager # [phone]

## 2014-08-25 NOTE — Progress Notes (Signed)
Note/chart reviewed.  Stacie Templin Barnett RD, LDN Inpatient Clinical Dietitian Pager: [phone] After Hours Pager: [phone]  

## 2014-08-25 NOTE — Progress Notes (Signed)
Physical Therapy Session Note  Patient Details  Name: Tony Davis MRN: [id number] Date of Birth: 01-06-1961  Today's Date: 08/25/2014 PT Individual Time: 1400-1500 PT Individual Time Calculation (min): 60 min   Short Term Goals: Week 4:  PT Short Term Goal 1 (Week 4): Pt to perform bed mobility with min Ax1person, 50% of time PT Short Term Goal 2 (Week 4): Pt to perform bed<>w/c transfers with min Ax1person, 50% of time PT Short Term Goal 3 (Week 4): Pt to propel w/c 100' with min Ax1person, 50% of time PT Short Term Goal 4 (Week 4): Pt to ambulate 150' with min Ax1 person, 50% of time PT Short Term Goal 5 (Week 4): Pt to negotiate up/down 5 steps w/ B rail and min Ax1person, 50% of time  Skilled Therapeutic Interventions/Progress Updates:  1:1. Pt received sitting in w/c, ready for therapy. Focus this session on functional endurance, functional ambulation and transfers, cognitive remediation. Pt req min guard-min A for ambulation 250'x1 and 175'x2 with RW, therapist facilitating increased pace for stimulation and attention to task due to distractibility. Pt able to complete all t/f sit<>stand and car t/f with min cues for initiation, mod cues for safety and seq during car t/f. Pt req total cues for orientation to time, place, situation as well as to review earlier therapies. Pt engaged in basic pipe tree model at seated level. Initially staring with field of 6 pieces>decreasing to 3 pieces>decreasing to therapist assembling L half of model, pt req max-total multimodal cues for completion due to decreased sustained>selective attention and problem solving. Pt sitting in recliner at end of session w/ all needs in reach, quick release belt in place.   Therapy Documentation Precautions:  Precautions Precautions: Fall;Other (comment) Precaution Comments: PEG/abdominal precautions, trach, Bilateral thigh high TED hose  Restrictions Weight Bearing Restrictions: No  See FIM for current  functional status  Therapy/Group: Individual Therapy  Tony Davis 08/25/2014, 5:54 PM

## 2014-08-25 NOTE — Progress Notes (Signed)
Subjective/Complaints:   Denies pain. Sleeping well. No sob, cough, cp Review of Systems -remains limited due to cognitive status   Objective: Vital Signs: Blood pressure 139/85, pulse 79, temperature 98.2 F (36.8 C), temperature source Oral, resp. rate 18, weight 114.533 kg (252 lb 8 oz), SpO2 98.00%. No results found. Results for orders placed during the hospital encounter of 07/30/14 (from the past 72 hour(s))  GLUCOSE, CAPILLARY     Status: Abnormal   Collection Time    08/22/14 11:23 AM      Result Value Ref Range   Glucose-Capillary 111 (*) 70 - 99 mg/dL  GLUCOSE, CAPILLARY     Status: None   Collection Time    08/22/14  4:33 PM      Result Value Ref Range   Glucose-Capillary 95  70 - 99 mg/dL  GLUCOSE, CAPILLARY     Status: Abnormal   Collection Time    08/22/14  9:07 PM      Result Value Ref Range   Glucose-Capillary 128 (*) 70 - 99 mg/dL  GLUCOSE, CAPILLARY     Status: None   Collection Time    08/23/14  6:59 AM      Result Value Ref Range   Glucose-Capillary 90  70 - 99 mg/dL  GLUCOSE, CAPILLARY     Status: None   Collection Time    08/23/14 11:47 AM      Result Value Ref Range   Glucose-Capillary 98  70 - 99 mg/dL  GLUCOSE, CAPILLARY     Status: Abnormal   Collection Time    08/23/14  4:27 PM      Result Value Ref Range   Glucose-Capillary 104 (*) 70 - 99 mg/dL  GLUCOSE, CAPILLARY     Status: Abnormal   Collection Time    08/23/14  8:12 PM      Result Value Ref Range   Glucose-Capillary 136 (*) 70 - 99 mg/dL  GLUCOSE, CAPILLARY     Status: Abnormal   Collection Time    08/24/14  6:39 AM      Result Value Ref Range   Glucose-Capillary 104 (*) 70 - 99 mg/dL  GLUCOSE, CAPILLARY     Status: None   Collection Time    08/24/14  3:58 PM      Result Value Ref Range   Glucose-Capillary 95  70 - 99 mg/dL  GLUCOSE, CAPILLARY     Status: None   Collection Time    08/24/14  9:20 PM      Result Value Ref Range   Glucose-Capillary 99  70 - 99 mg/dL      HEENT: neck clean. Trach site closed Cardio: RRR and no murmur Resp: CTA B/L and unlabored, clear.  GI: BS positive and PEG site clean. Extremity:  Bilateral pedal edema trace to 1+  Skin:   Intact other than above Neuro:   Initiates more. Better attention/awareness.  Abnormal Sensory reduced on left difficult to assess secondary to MS, Abnormal Motor 4 LUE, 3 to 3+ RLE, 3- LLE, 5/5 RUE grossly  Musc/Skel:  Normal GEN NAD   Assessment/Plan: 1. Functional deficits secondary to left Thalamic ICH, Hydrocephalus with Right  shunt which require 3+ hours per day of interdisciplinary therapy in a comprehensive inpatient rehab setting. Physiatrist is providing close team supervision and 24 hour management of active medical problems listed below. Physiatrist and rehab team continue to assess barriers to discharge/monitor patient progress toward functional and medical goals.  FIM: FIM - Bathing Bathing  Steps Patient Completed: Chest;Right Arm;Left Arm;Abdomen;Front perineal area;Right upper leg;Left upper leg Bathing: 3: Mod-Patient completes 5-7 56f 10 parts or 50-74%  FIM - Upper Body Dressing/Undressing Upper body dressing/undressing steps patient completed: Thread/unthread right sleeve of pullover shirt/dresss;Thread/unthread left sleeve of pullover shirt/dress;Put head through opening of pull over shirt/dress;Pull shirt over trunk Upper body dressing/undressing: 5: Supervision: Safety issues/verbal cues FIM - Lower Body Dressing/Undressing Lower body dressing/undressing steps patient completed: Thread/unthread right underwear leg;Thread/unthread left underwear leg;Pull underwear up/down;Thread/unthread right pants leg;Thread/unthread left pants leg;Pull pants up/down Lower body dressing/undressing: 3: Mod-Patient completed 50-74% of tasks  FIM - Toileting Toileting steps completed by patient: Adjust clothing prior to toileting Toileting Assistive Devices: Grab bar or rail for  support Toileting: 1: Total-Patient completed zero steps, helper did all 3  FIM - Radio producer Devices: Recruitment consultant Transfers: 4-To toilet/BSC: Min A (steadying Pt. > 75%);1-Two helpers  FIM - Control and instrumentation engineer Devices: Environmental consultant;Arm rests Bed/Chair Transfer: 4: Supine > Sit: Min A (steadying Pt. > 75%/lift 1 leg);3: Bed > Chair or W/C: Mod A (lift or lower assist)  FIM - Locomotion: Wheelchair Locomotion: Wheelchair: 0: Activity did not occur FIM - Locomotion: Ambulation Locomotion: Ambulation Assistive Devices: Walker - Rolling;Other (comment) (R arm around therapist) Ambulation/Gait Assistance: 4: Min assist;3: Mod assist;4: Min guard Locomotion: Ambulation: 3: Travels 150 ft or more with moderate assistance (Pt: 50 - 74%)  Comprehension Comprehension Mode: Auditory Comprehension: 4-Understands basic 75 - 89% of the time/requires cueing 10 - 24% of the time  Expression Expression Mode: Verbal Expression Assistive Devices: 6-Talk trach valve Expression: 3-Expresses basic 50 - 74% of the time/requires cueing 25 - 50% of the time. Needs to repeat parts of sentences.  Social Interaction Social Interaction Mode: Asleep Social Interaction: 3-Interacts appropriately 50 - 74% of the time - May be physically or verbally inappropriate.  Problem Solving Problem Solving Mode: Asleep Problem Solving: 2-Solves basic 25 - 49% of the time - needs direction more than half the time to initiate, plan or complete simple activities  Memory Memory Mode: Asleep Memory: 2-Recognizes or recalls 25 - 49% of the time/requires cueing 51 - 75% of the time  Medical Problem List and Plan:  1. Functional deficits secondary to Left thalamic bleed with hydrocephalus.  2. DVT Prophylaxis/Anticoagulation/has IVCF: Pharmaceutical: Lovenox-  -TEDs for edema, elevation  -hx of dvt  3. Chronic B-knee/Pain Management: Prn oxycodone.    Voltaren gel for knees.  -poorly-compliant pain patient in my practice 4. Mood: Monitor as mentation improves.  5. Neuropsych: This patient is not capable of making decisions on his own behalf.   -continue low dose ritalin---will not push higher given CV history 6. Skin/Wound Care:   Continue air mattress overlay.   7. DM type 2: Will monitor BS with ac/hs checks. Continue Levemir bid and adjust as indicated.  -reasonable control still  8. Enterobacter tracheobronchitis: resolved 7. HTN/CAD:  -metoprolol bid  -added HCTZ to assist with edema as well as bp---has helped  -increased norvasc to 10mg  8. ABLA: on iron supplement.   9. Dysphagia: on d1 thins, eating well  -off TF  -advance diet per SLP 10. Bladder- I challenged him to call the RN when he feels ready to empty----still quite incontinent   LOS (Days) 26 A FACE TO FACE EVALUATION WAS PERFORMED  Zidane Renner T 08/25/2014, 7:57 AM

## 2014-08-25 NOTE — Progress Notes (Signed)
Speech Language Pathology Daily Session Note  Patient Details  Name: Tony Davis MRN: [id number] Date of Birth: 02-May-1961  Today's Date: 08/25/2014 SLP Individual Time: 0800-0900 SLP Individual Time Calculation (min): 60 min  Short Term Goals: Week 4: SLP Short Term Goal 1 (Week 4): Patient will sustain attention to basic self-care task for ~5 minutes with Max multimodal cues.  SLP Short Term Goal 2 (Week 4): Patient will demonstrate use of call bell with Max multimodal cues. SLP Short Term Goal 3 (Week 4): Patient will follow basic, 1-step directions during self-care tasks with Min multimodal cues. SLP Short Term Goal 4 (Week 4): Patient will initiate verbal response to yes/no questions with Min multimodal cues. SLP Short Term Goal 5 (Week 4): Patient will initiate functional tasks with Mod A multimodal cues.  SLP Short Term Goal 6 (Week 4): Patient will utilize swallowing compensatory strategies with current diet to minimize overt s/s of aspiration with supervision multimodal cues.   Skilled Therapeutic Interventions: Skilled treatment session focused on cognitive-linguistic and dysphagia goals. Student facilitated session by initially providing Mod A progressing to Total-Max multimodal cues by the end of session for utilization of small bites, slow rate and intermittent multiple swallows during trial breakfast tray of Dys. 2 textures with thin liquids. Patient had overt coughing X 2 with an intermittent wet vocal quality, suspect due to possible pharyngeal residuals. A cued second swallow was successful in eliminating cough, however, patient required Max multimodal cues and extra time to complete, therefore, recommend patient continue current diet of Dys. 1 textures with thin liquids. Patient utilized gestures for responses with ~75% of opportunities and required Max A multimodal cues for selective attention to self-feeding in a mildly distracting environment. Patient demonstrated  intermittent language of confusion and verbal agitation, suspect due to need of increased verbal cues to complete meal safety. Continue with current plan of care.    FIM:  Comprehension Comprehension Mode: Auditory Comprehension: 3-Understands basic 50 - 74% of the time/requires cueing 25 - 50%  of the time Expression Expression Mode: Verbal Expression: 2-Expresses basic 25 - 49% of the time/requires cueing 50 - 75% of the time. Uses single words/gestures. Social Interaction Social Interaction: 2-Interacts appropriately 25 - 49% of time - Needs frequent redirection. Problem Solving Problem Solving: 2-Solves basic 25 - 49% of the time - needs direction more than half the time to initiate, plan or complete simple activities Memory Memory: 2-Recognizes or recalls 25 - 49% of the time/requires cueing 51 - 75% of the time FIM - Eating Eating Activity: 4: Help with picking up utensils;4: Helper occasionally scoops food on utensil;5: Needs verbal cues/supervision  Pain Pain Assessment Pain Assessment: No/denies pain  Therapy/Group: Individual Therapy  Katie Faraone 08/25/2014, 12:10 PM

## 2014-08-25 NOTE — Patient Care Conference (Signed)
Inpatient RehabilitationTeam Conference and Plan of Care Update Date: 08/24/2014   Time: 2:55 PM    Patient Name: Tony Davis      Medical Record Number: [id number]  Date of Birth: 13-May-1961 Sex: Male         Room/Bed: 4W05C/4W05C-01 Payor Info: Payor: MEDICARE / Plan: MEDICARE PART A AND B / Product Type: *No Product type* /    Admitting Diagnosis: DECOND  S P L BG   Admit Date/Time:  07/30/2014  8:40 PM Admission Comments: No comment available   Primary Diagnosis:  <principal problem not specified> Principal Problem: <principal problem not specified>  Patient Active Problem List   Diagnosis Date Noted  . Basal ganglia hemorrhage 07/30/2014  . Syncope due to orthostatic hypotension 04/15/2014  . Medication adverse effect 04/15/2014  . Syncope 04/15/2014  . Routine general medical examination at a health care facility 02/11/2014  . BPH associated with nocturia 02/11/2014  . Gynecomastia 02/11/2014  . Osteoarthritis of left knee 08/21/2013  . Osteoarthritis of right knee 08/21/2013  . Insomnia, persistent 03/25/2013  . History of tobacco use 03/25/2013  . Chronic rhinitis 03/25/2013  . Hemiparesis due to old cerebral infarction 03/20/2013  . DJD (degenerative joint disease) of knee 10/13/2012  . Folate deficiency anemia 10/13/2012  . Long term (current) use of anticoagulants 03/28/2012  . DVT (deep venous thrombosis) 03/19/2012  . Obstructive sleep apnea 08/16/2011  . Type II or unspecified type diabetes mellitus without mention of complication, uncontrolled 08/06/2011  . PATENT FORAMEN OVALE 03/13/2010  . CVA 02/08/2010  . CAD, NATIVE VESSEL 01/26/2010  . Hyperlipidemia LDL goal < 100 01/23/2010  . Essential hypertension, benign 01/23/2010  . GERD 01/23/2010    Expected Discharge Date: Expected Discharge Date: 09/02/14  Team Members Present: Physician leading conference: Dr. Alger Simons Social Worker Present: Ovidio Kin, LCSW Nurse Present: Elliot Cousin,  RN PT Present: Melene Plan, Cottie Banda, PT OT Present: Roanna Epley, COTA;Kayla Perkinson, OT;Jennifer Tamala Julian, OT SLP Present: Weston Anna, SLP PPS Coordinator present : Daiva Nakayama, RN, CRRN     Current Status/Progress Goal Weekly Team Focus  Medical   improving cognition, swallow advancing. motor performance better.   improve continence.   timed voids, improve po intake   Bowel/Bladder   pt incontinent of bowel and bladder. Condom cath used HS. No initiation with staff to void or BMs  Manage bowel and bladder with max assist.  offer timed tolieting q2hrs.    Swallow/Nutrition/ Hydration   Dys. 1 textures with thin liquids, trials of Dys. 2 textures with thin liquids, Max A  Mod A  increase utilization of swallowing strategies, tolerance of upgraded textures   ADL's   UB dressing-min/mod A/ LN dressing mod A, mod A for transfers; extra time to initiate with max verbal cues  mod A overall, min A UB dressing  attention, orientation, transfers, sitting balance, bed mobility   Mobility   Mod-max A for transfers; Min-mod A for ambulation, w/c propulsion, stair negotiation; mod-max A for attention, initiation, awareness   Supervision for sitting balance, bed mobility; Min A for standing balance, ambulation, w/c propulsion, stair negotiation; Mod A for cognition  activity tolerance, bed mobility, t/fs, balance, safety during functional mobility, attention, initiaiton, orientation    Communication   Max A  Mod A  initiation of verbal responses   Safety/Cognition/ Behavioral Observations  Max A  mod assist  attention, initiation, problem solving, orientation   Pain   no complaints of pain to nursing staff. Volteran  gel to BL knees   < 3 out or 10  assess pain qshift. medicate as needed   Skin   pt on mattress overlay. Barrier cream used to sacrum.   no new skin breakdown or infection during rehab stay with mod assist.  assess skin q shift. apply epbc prn    Rehab Goals Patient on  target to meet rehab goals: Yes *See Care Plan and progress notes for long and short-term goals.  Barriers to Discharge: substantial deficits    Possible Resolutions to Barriers:  family ed    Discharge Planning/Teaching Needs:  wife reports she still plans to have pt d/c home with her as primary support the first couple of weeks then her father and neighbor providing assist when she returns to work      Team Discussion:  Cognition continues to improve and initiation better.  Stopping tube feeds and trials of D2 today.  Min/ mod assist overall and still with some incontinence - trying timed toileting.  Revisions to Treatment Plan:  Stop tube feeds   Continued Need for Acute Rehabilitation Level of Care: The patient requires daily medical management by a physician with specialized training in physical medicine and rehabilitation for the following conditions: Daily direction of a multidisciplinary physical rehabilitation program to ensure safe treatment while eliciting the highest outcome that is of practical value to the patient.: Yes Daily medical management of patient stability for increased activity during participation in an intensive rehabilitation regime.: Yes Daily analysis of laboratory values and/or radiology reports with any subsequent need for medication adjustment of medical intervention for : Neurological problems;Post surgical problems  Damarian Priola 08/25/2014, 2:29 PM

## 2014-08-26 ENCOUNTER — Inpatient Hospital Stay (HOSPITAL_COMMUNITY): Payer: Medicare Other | Admitting: *Deleted

## 2014-08-26 ENCOUNTER — Inpatient Hospital Stay (HOSPITAL_COMMUNITY): Payer: Medicare Other

## 2014-08-26 ENCOUNTER — Inpatient Hospital Stay (HOSPITAL_COMMUNITY): Payer: Medicare Other | Admitting: Speech Pathology

## 2014-08-26 LAB — GLUCOSE, CAPILLARY
GLUCOSE-CAPILLARY: 92 mg/dL (ref 70–99)
Glucose-Capillary: 101 mg/dL — ABNORMAL HIGH (ref 70–99)
Glucose-Capillary: 87 mg/dL (ref 70–99)
Glucose-Capillary: 105 mg/dL — ABNORMAL HIGH (ref 70–99)

## 2014-08-26 NOTE — Progress Notes (Signed)
Occupational Therapy Session Note  Patient Details  Name: Tony Davis MRN: [id number] Date of Birth: 09/20/1961  Today's Date: 08/26/2014 OT Individual Time: 0700-0800 OT Individual Time Calculation (min): 60 min    Short Term Goals: Week 4:  OT Short Term Goal 1 (Week 4): Pt will perform tub/shower transfer with min A OT Short Term Goal 2 (Week 4): Pt will perform bathing tasks with min A OT Short Term Goal 3 (Week 4): Pt will perform toilet transfers with min A  Skilled Therapeutic Interventions/Progress Updates:    Pt engaged in BADL retraining including bathing at shower lever and dressing with sit<>stand from w/c.  Pt continues to required extra time to initiate tasks.  Pt requires max verbal cues to initiate transfers and sit<>stand but is initiating bathing and dressing tasks without verbal cues when presented with bathing supplies and clothing.  Pt completed squat pivot transfer from shower seat to w/c with supervison.  Pt continues to require mod verbal cues for reinitiating tasks. Focus on task initiation, sequencing, attention to task, transfers, sit<>stand, standing balance, and activity tolerance.  Therapy Documentation Precautions:  Precautions Precautions: Fall;Other (comment) Precaution Comments: PEG/abdominal precautions, trach, Bilateral thigh high TED hose  Restrictions Weight Bearing Restrictions: No Pain: Pain Assessment Pain Assessment: No/denies pain  See FIM for current functional status  Therapy/Group: Individual Therapy  Leroy Libman 08/26/2014, 8:02 AM

## 2014-08-26 NOTE — Progress Notes (Signed)
Speech Language Pathology Daily Session Note  Patient Details  Name: Tony Davis MRN: [id number] Date of Birth: 02/20/1961  Today's Date: 08/26/2014 SLP Individual Time: 0800-0900 SLP Individual Time Calculation (min): 60 min  Short Term Goals: Week 4: SLP Short Term Goal 1 (Week 4): Patient will sustain attention to basic self-care task for ~5 minutes with Max multimodal cues.  SLP Short Term Goal 2 (Week 4): Patient will demonstrate use of call bell with Max multimodal cues. SLP Short Term Goal 3 (Week 4): Patient will follow basic, 1-step directions during self-care tasks with Min multimodal cues. SLP Short Term Goal 4 (Week 4): Patient will initiate verbal response to yes/no questions with Min multimodal cues. SLP Short Term Goal 5 (Week 4): Patient will initiate functional tasks with Mod A multimodal cues.  SLP Short Term Goal 6 (Week 4): Patient will utilize swallowing compensatory strategies with current diet to minimize overt s/s of aspiration with supervision multimodal cues.   Skilled Therapeutic Interventions: Skilled treatment session focused on cognitive-linguistic and dysphagia goals. Initially, patient demonstrated impulsivity with self-feeding and required Mod A multimodal cues for utilization of small bites and slow rate, however, cueing progressed to Total A multimodal cues for initiation of self-feeding due to decrease in selective attention at end of breakfast meal of Dys. 1 textures with thin liquids. Patient had overt coughing X 2 and required Min A multimodal cues to initiate swallow intermittently throughout the meal, suspect coughing due to what appeared to be a delayed swallow initiation. Student also facilitated session by providing Min-Mod A verbal cues for utilization of verbal expression for responses instead of gestures and increased vocal intensity for improved speech intelligibility. Patient demonstrated intermittent language of confusion. Continue with current  plan of care.    FIM:  Comprehension Comprehension Mode: Auditory Comprehension: 3-Understands basic 50 - 74% of the time/requires cueing 25 - 50%  of the time Expression Expression Mode: Verbal Expression: 3-Expresses basic 50 - 74% of the time/requires cueing 25 - 50% of the time. Needs to repeat parts of sentences. Social Interaction Social Interaction: 2-Interacts appropriately 25 - 49% of time - Needs frequent redirection. Problem Solving Problem Solving: 2-Solves basic 25 - 49% of the time - needs direction more than half the time to initiate, plan or complete simple activities Memory Memory: 2-Recognizes or recalls 25 - 49% of the time/requires cueing 51 - 75% of the time FIM - Eating Eating Activity: 4: Help with managing cup/glass;4: Helper occasionally scoops food on utensil;5: Needs verbal cues/supervision  Pain Pain Assessment Pain Assessment: No/denies pain  Therapy/Group: Individual Therapy  Tony Davis 08/26/2014, 12:44 PM

## 2014-08-26 NOTE — Progress Notes (Signed)
Physical Therapy Session Note  Patient Details  Name: Tony Davis MRN: [id number] Date of Birth: 1961/01/25  Today's Date: 08/26/2014 PT Individual Time: 1300-1400 PT Individual Time Calculation (min): 60 min   Short Term Goals: Week 4:  PT Short Term Goal 1 (Week 4): Pt to perform bed mobility with min Ax1person, 50% of time PT Short Term Goal 2 (Week 4): Pt to perform bed<>w/c transfers with min Ax1person, 50% of time PT Short Term Goal 3 (Week 4): Pt to propel w/c 100' with min Ax1person, 50% of time PT Short Term Goal 4 (Week 4): Pt to ambulate 150' with min Ax1 person, 50% of time PT Short Term Goal 5 (Week 4): Pt to negotiate up/down 5 steps w/ B rail and min Ax1person, 50% of time  Skilled Therapeutic Interventions/Progress Updates:    Patient received sitting in recliner. Session focused on initiation, sustained attention, and command following with functional mobility and therapeutic activities. Wheelchair mobility 100' x1 with B UE and supervision. Patient requires increased time and max cues for sustained attention to task. Patient functioning at [facility] level overall for functional transfers and functional ambulation x185' with RW, mod-max cues for initiation.   Sitting EOM, patient engaged in shooting baskets, declines to perform in standing despite several attempts. Elevated mat so B feet unsupported for balance challenge. Patient required to pick ball up off floor or catch when tossed by therapist. Patient with improved attention to task when therapist tosses ball to him. Patient returned to room and left sitting in recliner with feet elevated, seatbelt on, and all needs within reach.  Therapy Documentation Precautions:  Precautions Precautions: Fall Precaution Comments: PEG/abdominal precautions, trach, Bilateral thigh high TED hose  Restrictions Weight Bearing Restrictions: No Pain: Pain Assessment Pain Assessment: No/denies pain Pain Score: 0-No pain Locomotion  : Ambulation Ambulation/Gait Assistance: 4: Min guard;4: Min Financial controller Distance: 100   See FIM for current functional status  Therapy/Group: Individual Therapy  Lillia Abed. Athel Merriweather, PT, DPT 08/26/2014, 1:59 PM

## 2014-08-26 NOTE — Progress Notes (Signed)
Social Work Patient ID: [name], male   DOB: [birth year], 53 y.o.   MRN: [id number]  Spoke with pt's wife yesterday afternoon to review team conference.  Have explained that team continues to plan toward d/c 9/24 with 24/7 assistance needed.  Stressed to wife that team believes pt will need someone with him at all times who is able to cue/ direct activities AND provide hands on assist even with toileting.  Explained that this level of care is expected to be long term and that she needs to strongly consider if she, her father and their neighbor can provide this level of care.  Wife admits she is concerned about whether they can provide this as she only has a max of about 20 days left that she can be out of work.  She asks to be able to "think about it" and I stressed the benefit that she be here to attend all therapies either Friday or Monday (could not come in today).  She is to contact me today to let me know which day she will be here and to confirm what her caregiving plan is.  Continue to follow.  Matvey Llanas, LCSW

## 2014-08-26 NOTE — Progress Notes (Signed)
The skilled treatment note has been reviewed and SLP is in agreement.  Franklyn Cafaro, M.A., CCC-SLP  [phone]   

## 2014-08-26 NOTE — Progress Notes (Signed)
Social Work Patient ID: Tony Davis, male   DOB: [birth year], 53 y.o.   MRN: [id number]  Lennart Pall, LCSW Social Worker Signed  Patient Care Conference Service date: 08/25/2014 2:28 PM  Inpatient RehabilitationTeam Conference and Plan of Care Update Date: 08/24/2014   Time: 2:55 PM     Patient Name: Tony Davis       Medical Record Number: [id number]   Date of Birth: 1960/12/13 Sex: Male         Room/Bed: 4W05C/4W05C-01 Payor Info: Payor: MEDICARE / Plan: MEDICARE PART A AND B / Product Type: *No Product type* /   Admitting Diagnosis: DECOND  S P L BG   Admit Date/Time:  07/30/2014  8:40 PM Admission Comments: No comment available   Primary Diagnosis:  <principal problem not specified> Principal Problem: <principal problem not specified>    Patient Active Problem List     Diagnosis  Date Noted   .  Basal ganglia hemorrhage  07/30/2014   .  Syncope due to orthostatic hypotension  04/15/2014   .  Medication adverse effect  04/15/2014   .  Syncope  04/15/2014   .  Routine general medical examination at a health care facility  02/11/2014   .  BPH associated with nocturia  02/11/2014   .  Gynecomastia  02/11/2014   .  Osteoarthritis of left knee  08/21/2013   .  Osteoarthritis of right knee  08/21/2013   .  Insomnia, persistent  03/25/2013   .  History of tobacco use  03/25/2013   .  Chronic rhinitis  03/25/2013   .  Hemiparesis due to old cerebral infarction  03/20/2013   .  DJD (degenerative joint disease) of knee  10/13/2012   .  Folate deficiency anemia  10/13/2012   .  Long term (current) use of anticoagulants  03/28/2012   .  DVT (deep venous thrombosis)  03/19/2012   .  Obstructive sleep apnea  08/16/2011   .  Type II or unspecified type diabetes mellitus without mention of complication, uncontrolled  08/06/2011   .  PATENT FORAMEN OVALE  03/13/2010   .  CVA  02/08/2010   .  CAD, NATIVE VESSEL  01/26/2010   .  Hyperlipidemia LDL goal < 100  01/23/2010   .   Essential hypertension, benign  01/23/2010   .  GERD  01/23/2010     Expected Discharge Date: Expected Discharge Date: 09/02/14  Team Members Present: Physician leading conference: Dr. Alger Simons Social Worker Present: Ovidio Kin, LCSW Nurse Present: Elliot Cousin, RN PT Present: Melene Plan, Cottie Banda, PT OT Present: Roanna Epley, COTA;Kayla Perkinson, OT;Jennifer Tamala Julian, OT SLP Present: Weston Anna, SLP PPS Coordinator present : Daiva Nakayama, RN, CRRN        Current Status/Progress  Goal  Weekly Team Focus   Medical     improving cognition, swallow advancing. motor performance better.   improve continence.   timed voids, improve po intake   Bowel/Bladder     pt incontinent of bowel and bladder. Condom cath used HS. No initiation with staff to void or BMs  Manage bowel and bladder with max assist.  offer timed tolieting q2hrs.    Swallow/Nutrition/ Hydration     Dys. 1 textures with thin liquids, trials of Dys. 2 textures with thin liquids, Max A  Mod A  increase utilization of swallowing strategies, tolerance of upgraded textures   ADL's     UB dressing-min/mod A/ LN dressing mod A, mod  A for transfers; extra time to initiate with max verbal cues  mod A overall, min A UB dressing  attention, orientation, transfers, sitting balance, bed mobility   Mobility     Mod-max A for transfers; Min-mod A for ambulation, w/c propulsion, stair negotiation; mod-max A for attention, initiation, awareness   Supervision for sitting balance, bed mobility; Min A for standing balance, ambulation, w/c propulsion, stair negotiation; Mod A for cognition  activity tolerance, bed mobility, t/fs, balance, safety during functional mobility, attention, initiaiton, orientation    Communication     Max A  Mod A  initiation of verbal responses   Safety/Cognition/ Behavioral Observations    Max A  mod assist  attention, initiation, problem solving, orientation   Pain     no complaints of pain  to nursing staff. Volteran gel to BL knees   < 3 out or 10  assess pain qshift. medicate as needed   Skin     pt on mattress overlay. Barrier cream used to sacrum.   no new skin breakdown or infection during rehab stay with mod assist.  assess skin q shift. apply epbc prn    Rehab Goals Patient on target to meet rehab goals: Yes *See Care Plan and progress notes for long and short-term goals.    Barriers to Discharge:  substantial deficits      Possible Resolutions to Barriers:    family ed      Discharge Planning/Teaching Needs:    wife reports she still plans to have pt d/c home with her as primary support the first couple of weeks then her father and neighbor providing assist when she returns to work      Team Discussion:    Cognition continues to improve and initiation better.  Stopping tube feeds and trials of D2 today.  Min/ mod assist overall and still with some incontinence - trying timed toileting.   Revisions to Treatment Plan:    Stop tube feeds    Continued Need for Acute Rehabilitation Level of Care: The patient requires daily medical management by a physician with specialized training in physical medicine and rehabilitation for the following conditions: Daily direction of a multidisciplinary physical rehabilitation program to ensure safe treatment while eliciting the highest outcome that is of practical value to the patient.: Yes Daily medical management of patient stability for increased activity during participation in an intensive rehabilitation regime.: Yes Daily analysis of laboratory values and/or radiology reports with any subsequent need for medication adjustment of medical intervention for : Neurological problems;Post surgical problems  Ronnisha Felber 08/25/2014, 2:29 PM         Lennart Pall, [place] Worker Signed  Patient Care Conference Service date: 08/19/2014 10:48 AM  Inpatient RehabilitationTeam Conference and Plan of Care Update Date: 08/17/2014   Time:  2:50 PM     Patient Name: Tony Davis       Medical Record Number: [id number]   Date of Birth: Jul 29, 1961 Sex: Male         Room/Bed: 4W05C/4W05C-01 Payor Info: Payor: MEDICARE / Plan: MEDICARE PART A AND B / Product Type: *No Product type* /   Admitting Diagnosis: DECOND  S P L BG   Admit Date/Time:  07/30/2014  8:40 PM Admission Comments: No comment available   Primary Diagnosis:  <principal problem not specified> Principal Problem: <principal problem not specified>    Patient Active Problem List     Diagnosis  Date Noted   .  Basal ganglia hemorrhage  07/30/2014   .  Syncope due to orthostatic hypotension  04/15/2014   .  Medication adverse effect  04/15/2014   .  Syncope  04/15/2014   .  Routine general medical examination at a health care facility  02/11/2014   .  BPH associated with nocturia  02/11/2014   .  Gynecomastia  02/11/2014   .  Osteoarthritis of left knee  08/21/2013   .  Osteoarthritis of right knee  08/21/2013   .  Insomnia, persistent  03/25/2013   .  History of tobacco use  03/25/2013   .  Chronic rhinitis  03/25/2013   .  Hemiparesis due to old cerebral infarction  03/20/2013   .  DJD (degenerative joint disease) of knee  10/13/2012   .  Folate deficiency anemia  10/13/2012   .  Long term (current) use of anticoagulants  03/28/2012   .  DVT (deep venous thrombosis)  03/19/2012   .  Obstructive sleep apnea  08/16/2011   .  Type II or unspecified type diabetes mellitus without mention of complication, uncontrolled  08/06/2011   .  PATENT FORAMEN OVALE  03/13/2010   .  CVA  02/08/2010   .  CAD, NATIVE VESSEL  01/26/2010   .  Hyperlipidemia LDL goal < 100  01/23/2010   .  Essential hypertension, benign  01/23/2010   .  GERD  01/23/2010     Expected Discharge Date: Expected Discharge Date: 09/02/14  Team Members Present: Physician leading conference: Dr. Alger Simons Social Worker Present: Lennart Pall, LCSW Nurse Present: Elliot Cousin, RN PT  Present: Melene Plan, Cottie Banda, PT OT Present: Willeen Cass, Roland Earl, OT SLP Present: Weston Anna, SLP PPS Coordinator present : Daiva Nakayama, RN, CRRN        Current Status/Progress  Goal  Weekly Team Focus   Medical     improved attention, arousal. still slow to initiate  improve attention  pain control, begin diet   Bowel/Bladder     Incontinent b/b, LBM 9/7, condom cath HS, no initiation with staff to void  Manage bowel and bladder with mod assist.  offer toileting q 2h and PRN   Swallow/Nutrition/ Hydration     Dys. 1 textures with thin liquids, Max A  Mod A  increase PO intake, increase utilization of swallowing strategies, trials upgraded textures   ADL's     UB dressing - min A ; LB dressing mod A, mod A with extra time for sit to stands, decr intiation and activity tolerance, +2 for transfers for safety  mod A overall, min A UB dressing  attention, orientation, transfers, sitting balance, bed mobility   Mobility     Mod Ax1person to Ax2 persons overall dependent upon alertness arousal, attention, initiation, ability to follow simple commands; req mod-total cueing overall  Supervision for sitting balance; Min A for cognition and w/c propulsion; Mod A for bed mobility, transfers, balance, ambulation, stair negotiation.   activity tolerance, bed mobility, transfers, balance, standing balance, attention, initiation, orientation   Communication     Max A  Mod A  initiation of verbal responses   Safety/Cognition/ Behavioral Observations    Max-Total A  mod assist  attention, initaition, problem solving, orientation    Pain     denies pain, no evidence of pain       Skin     Air overlay mattress, excoriation to scrotum - applying barrier cream PRN  no new skin breakdown or infection during rehab stay  with mod assist.  Assess skin q shirt and PRN, turn q 2 hours    Rehab Goals Patient on target to meet rehab goals: Yes *See Care Plan and progress notes for  long and short-term goals.    Barriers to Discharge:  see prior, pain      Possible Resolutions to Barriers:    rx pain, distraction      Discharge Planning/Teaching Needs:    wife still plans to have pt d/c home with her and other family members providing 24/7 assistance      Team Discussion:    Improved attention.   Started on D1 diet with thin but actually completing a meal may be a barrier.  Moderate assist to initiate at times.  Still incont b/b and does not appear to have any awareness.  Timed toileting not successful so far.  On track for minimal assist w/c  Level.  Good session with wife on Monday, however, she does not verbally offer much opinion about how she feels he is progressing.  SW to follow up.   Revisions to Treatment Plan:    None    Continued Need for Acute Rehabilitation Level of Care: The patient requires daily medical management by a physician with specialized training in physical medicine and rehabilitation for the following conditions: Daily direction of a multidisciplinary physical rehabilitation program to ensure safe treatment while eliciting the highest outcome that is of practical value to the patient.: Yes Daily medical management of patient stability for increased activity during participation in an intensive rehabilitation regime.: Yes Daily analysis of laboratory values and/or radiology reports with any subsequent need for medication adjustment of medical intervention for : Post surgical problems;Neurological problems  Joda Braatz 08/19/2014, 10:49 AM         Lennart Pall, LCSW Social Worker Signed  Patient Care Conference Service date: 08/11/2014 6:43 AM  Inpatient RehabilitationTeam Conference and Plan of Care Update Date: 08/10/2014   Time: 2:50 PM     Patient Name: Tony Davis       Medical Record Number: [id number]   Date of Birth: [birth year] Sex: Male         Room/Bed: 4W05C/4W05C-01 Payor Info: Payor: MEDICARE / Plan: MEDICARE PART A AND B /  Product Type: *No Product type* /   Admitting Diagnosis: DECOND  S P L BG   Admit Date/Time:  07/30/2014  8:40 PM Admission Comments: No comment available   Primary Diagnosis:  <principal problem not specified> Principal Problem: <principal problem not specified>    Patient Active Problem List     Diagnosis  Date Noted   .  Basal ganglia hemorrhage  07/30/2014   .  Syncope due to orthostatic hypotension  04/15/2014   .  Medication adverse effect  04/15/2014   .  Syncope  04/15/2014   .  Routine general medical examination at a health care facility  02/11/2014   .  BPH associated with nocturia  02/11/2014   .  Gynecomastia  02/11/2014   .  Osteoarthritis of left knee  08/21/2013   .  Osteoarthritis of right knee  08/21/2013   .  Insomnia, persistent  03/25/2013   .  History of tobacco use  03/25/2013   .  Chronic rhinitis  03/25/2013   .  Hemiparesis due to old cerebral infarction  03/20/2013   .  DJD (degenerative joint disease) of knee  10/13/2012   .  Folate deficiency anemia  10/13/2012   .  Long term (  current) use of anticoagulants  03/28/2012   .  DVT (deep venous thrombosis)  03/19/2012   .  Obstructive sleep apnea  08/16/2011   .  Type II or unspecified type diabetes mellitus without mention of complication, uncontrolled  08/06/2011   .  PATENT FORAMEN OVALE  03/13/2010   .  CVA  02/08/2010   .  CAD, NATIVE VESSEL  01/26/2010   .  Hyperlipidemia LDL goal < 100  01/23/2010   .  Essential hypertension, benign  01/23/2010   .  GERD  01/23/2010     Expected Discharge Date: Expected Discharge Date: 09/02/14  Team Members Present: Physician leading conference: Dr. Alger Simons Social Worker Present: Lennart Pall, LCSW Nurse Present: Elliot Cousin, RN PT Present: Otis Brace, PT;Bridgett Ripa, PT OT Present: Roanna Epley, [place], OT SLP Present: Gunnar Fusi, SLP PPS Coordinator present : Daiva Nakayama, RN, CRRN        Current Status/Progress  Goal  Weekly  Team Focus   Medical     some improvement in arousal, attention. trach out  improve attention  improve activation, attention   Bowel/Bladder     Incontinent of bowel and bladder; LBM 8/31- liquid BMs  Manage bowel and bladder with mod assist.  offer toileting q2h and prn   Swallow/Nutrition/ Hydration     NPO with peg  Max Assist  Initiated water protocol   ADL's     UB dressing-mod A; bathing and LB dressing-tot A; transfers-tot A + 2; max A for task initiation; decreased activity tolerance; decreased attention  mod A overall, min A UB dressing  activity tolerance, bed mobility, attention, transfers, sitting balance   Mobility     ModAx1person to Ax2oersons overall for bed mobility, squat pivot transfers, t/f sit<>stand, amb w/ use of Sara +; Mod-max cues for focused attention, initiation, following simple step commands  Supervision for sitting balance; Min A for cognition and w/c propulsion; Mod A for bed mobility, transfers; Max A for standing balance and ambulation  activity tolerance, bd mobility, transfers, balance, standing balance, attention, initiation, orientation   Communication     Max-Total A  Mod A  Initiation of expression of wants/needs, following 1 step commands   Safety/Cognition/ Behavioral Observations    Total A  mod assist  Sustained attention, initiation, basic problem solving   Pain     Denies pain; no evidence of pain  < 3  Assess and treat for pain q shift and prn   Skin     air overlay mattress; excoriation to scrotum with few open areas- applying barrier cream prn  no new skin breakdown or infection during rehab stay with mod assist.  Turn q2h; utilize air overlay mattress; assess skin shift and prn    Rehab Goals Patient on target to meet rehab goals: Yes *See Care Plan and progress notes for long and short-term goals.    Barriers to Discharge:  see prior, chronicity of injury      Possible Resolutions to Barriers:    NMR, cognitive perceptual rx       Discharge Planning/Teaching Needs:    wife still plans to have pt d/c home with her and other family members providing 24/7 assistance   ongoing    Team Discussion:    Lurline Idol out.  Some improved initiation but not consistent.  Knee pain significantly inhibiting standing.  MD feels may need to limit amount of time up due to severe OA of both knees.  Overall goals of  mod assist at w/c level. H2O protocol underway.     Revisions to Treatment Plan:    May need to revise standing/ ambulation goals    Continued Need for Acute Rehabilitation Level of Care: The patient requires daily medical management by a physician with specialized training in physical medicine and rehabilitation for the following conditions: Daily direction of a multidisciplinary physical rehabilitation program to ensure safe treatment while eliciting the highest outcome that is of practical value to the patient.: Yes Daily medical management of patient stability for increased activity during participation in an intensive rehabilitation regime.: Yes Daily analysis of laboratory values and/or radiology reports with any subsequent need for medication adjustment of medical intervention for : Neurological problems;Post surgical problems  Oakes Mccready 08/11/2014, 6:43 AM

## 2014-08-26 NOTE — Progress Notes (Signed)
Subjective/Complaints:   No new issues.  Review of Systems -remains limited due to cognitive status   Objective: Vital Signs: Blood pressure 134/81, pulse 79, temperature 97.7 F (36.5 C), temperature source Oral, resp. rate 18, weight 117.346 kg (258 lb 11.2 oz), SpO2 100.00%. No results found. Results for orders placed during the hospital encounter of 07/30/14 (from the past 72 hour(s))  GLUCOSE, CAPILLARY     Status: None   Collection Time    08/23/14 11:47 AM      Result Value Ref Range   Glucose-Capillary 98  70 - 99 mg/dL  GLUCOSE, CAPILLARY     Status: Abnormal   Collection Time    08/23/14  4:27 PM      Result Value Ref Range   Glucose-Capillary 104 (*) 70 - 99 mg/dL  GLUCOSE, CAPILLARY     Status: Abnormal   Collection Time    08/23/14  8:12 PM      Result Value Ref Range   Glucose-Capillary 136 (*) 70 - 99 mg/dL  GLUCOSE, CAPILLARY     Status: Abnormal   Collection Time    08/24/14  6:39 AM      Result Value Ref Range   Glucose-Capillary 104 (*) 70 - 99 mg/dL  GLUCOSE, CAPILLARY     Status: None   Collection Time    08/24/14  3:58 PM      Result Value Ref Range   Glucose-Capillary 95  70 - 99 mg/dL  GLUCOSE, CAPILLARY     Status: None   Collection Time    08/24/14  9:20 PM      Result Value Ref Range   Glucose-Capillary 99  70 - 99 mg/dL  GLUCOSE, CAPILLARY     Status: None   Collection Time    08/25/14  7:57 AM      Result Value Ref Range   Glucose-Capillary 97  70 - 99 mg/dL   Comment 1 Notify RN    GLUCOSE, CAPILLARY     Status: None   Collection Time    08/25/14 11:23 AM      Result Value Ref Range   Glucose-Capillary 98  70 - 99 mg/dL  GLUCOSE, CAPILLARY     Status: Abnormal   Collection Time    08/25/14  4:35 PM      Result Value Ref Range   Glucose-Capillary 110 (*) 70 - 99 mg/dL  GLUCOSE, CAPILLARY     Status: Abnormal   Collection Time    08/25/14  9:50 PM      Result Value Ref Range   Glucose-Capillary 126 (*) 70 - 99 mg/dL   GLUCOSE, CAPILLARY     Status: Abnormal   Collection Time    08/26/14  6:55 AM      Result Value Ref Range   Glucose-Capillary 101 (*) 70 - 99 mg/dL     HEENT: neck clean. Trach site closed Cardio: RRR and no murmur Resp: CTA B/L and unlabored, clear.  GI: BS positive and PEG site clean. Extremity:  Bilateral pedal edema trace to 1+  Skin:   Intact other than above Neuro:   Initiates more. Better attention/awareness.  Abnormal Sensory reduced on left difficult to assess secondary to MS, Abnormal Motor 4 LUE, 3 to 3+ RLE, 3- LLE, 5/5 RUE grossly  Musc/Skel:  Normal GEN NAD   Assessment/Plan: 1. Functional deficits secondary to left Thalamic ICH, Hydrocephalus with Right  shunt which require 3+ hours per day of interdisciplinary therapy in a comprehensive  inpatient rehab setting. Physiatrist is providing close team supervision and 24 hour management of active medical problems listed below. Physiatrist and rehab team continue to assess barriers to discharge/monitor patient progress toward functional and medical goals.  FIM: FIM - Bathing Bathing Steps Patient Completed: Chest;Right Arm;Left Arm;Abdomen;Front perineal area;Right upper leg;Left upper leg Bathing: 3: Mod-Patient completes 5-7 14f 10 parts or 50-74%  FIM - Upper Body Dressing/Undressing Upper body dressing/undressing steps patient completed: Thread/unthread right sleeve of pullover shirt/dresss;Thread/unthread left sleeve of pullover shirt/dress;Put head through opening of pull over shirt/dress;Pull shirt over trunk Upper body dressing/undressing: 5: Supervision: Safety issues/verbal cues FIM - Lower Body Dressing/Undressing Lower body dressing/undressing steps patient completed: Thread/unthread right underwear leg;Thread/unthread left underwear leg;Pull underwear up/down;Thread/unthread right pants leg;Thread/unthread left pants leg;Pull pants up/down Lower body dressing/undressing: 3: Mod-Patient completed 50-74% of  tasks  FIM - Toileting Toileting steps completed by patient: Adjust clothing prior to toileting Toileting Assistive Devices: Grab bar or rail for support Toileting: 1: Total-Patient completed zero steps, helper did all 3  FIM - Radio producer Devices: Recruitment consultant Transfers: 4-To toilet/BSC: Min A (steadying Pt. > 75%);1-Two helpers  FIM - Control and instrumentation engineer Devices: Environmental consultant;Arm rests Bed/Chair Transfer: 4: Bed > Chair or W/C: Min A (steadying Pt. > 75%);4: Chair or W/C > Bed: Min A (steadying Pt. > 75%)  FIM - Locomotion: Wheelchair Locomotion: Wheelchair: 0: Activity did not occur FIM - Locomotion: Ambulation Locomotion: Ambulation Assistive Devices: Administrator Ambulation/Gait Assistance: 4: Min guard;4: Min assist Locomotion: Ambulation: 4: Travels 150 ft or more with minimal assistance (Pt.>75%)  Comprehension Comprehension Mode: Auditory Comprehension: 3-Understands basic 50 - 74% of the time/requires cueing 25 - 50%  of the time  Expression Expression Mode: Verbal Expression Assistive Devices: 6-Talk trach valve Expression: 3-Expresses basic 50 - 74% of the time/requires cueing 25 - 50% of the time. Needs to repeat parts of sentences.  Social Interaction Social Interaction Mode: Asleep Social Interaction: 3-Interacts appropriately 50 - 74% of the time - May be physically or verbally inappropriate.  Problem Solving Problem Solving Mode: Asleep Problem Solving: 2-Solves basic 25 - 49% of the time - needs direction more than half the time to initiate, plan or complete simple activities  Memory Memory Mode: Asleep Memory: 2-Recognizes or recalls 25 - 49% of the time/requires cueing 51 - 75% of the time  Medical Problem List and Plan:  1. Functional deficits secondary to Left thalamic bleed with hydrocephalus.  2. DVT Prophylaxis/Anticoagulation/has IVCF: Pharmaceutical: Lovenox-  -TEDs for edema,  elevation  -hx of dvt  3. Chronic B-knee/Pain Management: Prn oxycodone.   Voltaren gel for knees.  -poorly-compliant pain patient in my practice 4. Mood: Monitor as mentation improves.  5. Neuropsych: This patient is not capable of making decisions on his own behalf.   -continue low dose ritalin---will not push higher given CV history 6. Skin/Wound Care:   Continue air mattress overlay.   7. DM type 2: Will monitor BS with ac/hs checks. Continue Levemir bid    -reasonable control still  8. Enterobacter tracheobronchitis: resolved 7. HTN/CAD:  -metoprolol bid  -continue HCTZ  -increased norvasc to 10mg  8. ABLA: on iron supplement.   9. Dysphagia: on d1 thins, eating well  -off TF  -advance diet per SLP 10. Bladder- I challenged him to call the RN when he feels ready to empty----remains quite incontinent   LOS (Days) 27 A FACE TO FACE EVALUATION WAS PERFORMED  SWARTZ,ZACHARY T 08/26/2014, 8:27  AM

## 2014-08-27 ENCOUNTER — Inpatient Hospital Stay (HOSPITAL_COMMUNITY): Payer: Medicare Other

## 2014-08-27 ENCOUNTER — Inpatient Hospital Stay (HOSPITAL_COMMUNITY): Payer: Medicare Other | Admitting: Speech Pathology

## 2014-08-27 LAB — GLUCOSE, CAPILLARY
GLUCOSE-CAPILLARY: 82 mg/dL (ref 70–99)
Glucose-Capillary: 81 mg/dL (ref 70–99)
Glucose-Capillary: 97 mg/dL (ref 70–99)
Glucose-Capillary: 121 mg/dL — ABNORMAL HIGH (ref 70–99)

## 2014-08-27 NOTE — Progress Notes (Signed)
Social Work Patient ID: Tony Davis, male   DOB: January 03, 1961, 53 y.o.   MRN: [id number]  Have been able to reach wife who not reports that she feels she must change d/c plan to SNF as she does not have the ability to take enough time out of work, nor does she feel her father/ neighbor could meet care needs for pt at home.  Will begin bed search.  Alerting tx team and MD.  Lennart Pall, LCSW

## 2014-08-27 NOTE — Progress Notes (Signed)
Occupational Therapy Session Note  Patient Details  Name: Tony Davis MRN: [id number] Date of Birth: [birth year]  Today's Date: 08/27/2014 OT Individual Time: 0700-0800 OT Individual Time Calculation (min): 60 min    Short Term Goals: Week 4:  OT Short Term Goal 1 (Week 4): Pt will perform tub/shower transfer with min A OT Short Term Goal 2 (Week 4): Pt will perform bathing tasks with min A OT Short Term Goal 3 (Week 4): Pt will perform toilet transfers with min A  Skilled Therapeutic Interventions/Progress Updates:    Pt engaged in BADL retraining including bathing at shower level and dressing with sit<>stand from w/c.  Pt required extra time to initiate all transitional movements this morning and required mod A for sit<>stand to initiate transfers.  The one exception was when patient determined he needed to use toilet and stood without assistance to walk to toilet.  Pt stood at toilet with steady A while attempting to void.  Pt required mod verbal cues to initiate tasks, sequence, and attend to task.  Pt asked twice who was in his room talking.  I explained to patient that no one else was in his room.  Focus on task initiation, sequencing, attention to task, transfers, activity tolerance, and safety awareness.  Therapy Documentation Precautions:  Precautions Precautions: Fall Precaution Comments: PEG/abdominal precautions, trach, Bilateral thigh high TED hose  Restrictions Weight Bearing Restrictions: No Pain: Pain Assessment Pain Assessment: No/denies pain  See FIM for current functional status  Therapy/Group: Individual Therapy  Leroy Libman 08/27/2014, 9:55 AM

## 2014-08-27 NOTE — Progress Notes (Signed)
Social Work Patient ID: Tony Davis, male   DOB: [birth year], 53 y.o.   MRN: [id number]  Have not received any contact from pt's wife about scheduling of family ed.  Have attempted to reach via phone, however, unable to leave message on cell #.  Will continue to make attempts to arrange fam ed and keep tx team posted on this.  Adaleah Forget, LCSW

## 2014-08-27 NOTE — Progress Notes (Signed)
Physical Therapy Weekly Progress Note  Patient Details  Name: Tony Davis MRN: [id number] Date of Birth: 1961-12-06  Beginning of progress report period: August 20, 2014 End of progress report period: August 27, 2014  Today's Date: 08/27/2014 PT Individual Time: 1100-1200 PT Individual Time Calculation (min): 60 min   Pt continues to make slow, but steady progress in physical therapy and has achieved 5/5 STGs, see details below. Pt currently requires min A for bed mobility, supervision for sitting balance, min-mod A for transfers, min A for ambulation >150' w/ RW (mod A with HHA or arm around therapist's shoulder), min-mod A for standing balance and min A for stair negotiation. Continued emphasis on cognitive remediation during functional tasks to target initiation, attention, awareness, sequencing and safety with mod-max cues. Pt's wife to participate in formal family education/training next week in preparation for anticipated d/c home on 9/24.   Patient continues to demonstrate the following deficits: decreased activity tolerance, decreased strength, decreased balance, decreased postural control, decreased orientation, decreased initiation, decreased memory, decreased problem solving, decreased awareness, decreased attention, decreased functional transfers and mobility and therefore will continue to benefit from skilled PT intervention to enhance overall performance with activity tolerance, balance, postural control, functional use of right upper extremity, right lower extremity, left upper extremity and left lower extremity, attention, awareness, coordination and overall functional transfers and mobility.  Patient progressing toward long term goals..  Continue plan of care.  PT Short Term Goals Week 1:  PT Short Term Goal 1 (Week 1): Pt will transfer out of bed to w/c using maxi sky lift PT Short Term Goal 1 - Progress (Week 1): Met PT Short Term Goal 2 (Week 1): Pt will tolerate  sitting up in tilt in space w/c for 2 hours. PT Short Term Goal 2 - Progress (Week 1): Met PT Short Term Goal 3 (Week 1): Pt will roll in bed req 1 person assist PT Short Term Goal 3 - Progress (Week 1): Progressing toward goal PT Short Term Goal 4 (Week 1): Pt will tolerate supine/side lie to sit transfer req 2 person assist.  PT Short Term Goal 4 - Progress (Week 1): Met PT Short Term Goal 5 (Week 1): Pt will demonstrate sustained focus of an activity for 30 seconds.  PT Short Term Goal 5 - Progress (Week 1): Not met Week 2:  PT Short Term Goal 1 (Week 2): Pt will demonstrate focused attention to therapeutic task for 30 seconds w/ max multimodal cues PT Short Term Goal 1 - Progress (Week 2): Progressing toward goal PT Short Term Goal 2 (Week 2): Pt to perform bed mobility w/ max Ax1person PT Short Term Goal 2 - Progress (Week 2): Progressing toward goal PT Short Term Goal 3 (Week 2): Pt to transfer bed<>w/c with Ax2 persons without use of lift equipment PT Short Term Goal 3 - Progress (Week 2): Met PT Short Term Goal 4 (Week 2): Pt to demonstrate sitting balance with min guard A, 50% of time PT Short Term Goal 4 - Progress (Week 2): Met PT Short Term Goal 5 (Week 2): Pt will ambulate 100' w/ Ax2 persons PT Short Term Goal 5 - Progress (Week 2): Met Week 3:  PT Short Term Goal 1 (Week 3): Pt will demonstrated focused attention to therapeutic task for 30 seconds with max multimodal cues PT Short Term Goal 1 - Progress (Week 3): Met PT Short Term Goal 2 (Week 3): Pt to perform bed mobility with mod Ax1person, 50% of  time PT Short Term Goal 2 - Progress (Week 3): Met PT Short Term Goal 3 (Week 3): Pt to perform bed<>w/c transfers with max Ax1 person PT Short Term Goal 3 - Progress (Week 3): Met PT Short Term Goal 4 (Week 3): Pt to demonstrate standing balance with max Ax1person, 50% of time PT Short Term Goal 4 - Progress (Week 3): Met PT Short Term Goal 5 (Week 3): Pt to ambulate 100' with  mod Ax1 person PT Short Term Goal 5 - Progress (Week 3): Met Week 4:  PT Short Term Goal 1 (Week 4): Pt to perform bed mobility with min Ax1person, 50% of time PT Short Term Goal 1 - Progress (Week 4): Met PT Short Term Goal 2 (Week 4): Pt to perform bed<>w/c transfers with min Ax1person, 50% of time PT Short Term Goal 2 - Progress (Week 4): Met PT Short Term Goal 3 (Week 4): Pt to propel w/c 100' with min Ax1person, 50% of time PT Short Term Goal 3 - Progress (Week 4): Met PT Short Term Goal 4 (Week 4): Pt to ambulate 150' with min Ax1 person, 50% of time PT Short Term Goal 4 - Progress (Week 4): Met PT Short Term Goal 5 (Week 4): Pt to negotiate up/down 5 steps w/ B rail and min Ax1person, 50% of time PT Short Term Goal 5 - Progress (Week 4): Met Week 5:  PT Short Term Goal 1 (Week 5): STGs-LTGs due to anticipated LOS  Skilled Therapeutic Interventions/Progress Updates:  1:1. Pt received sitting in recliner, ready for therapy. Focus this session on activity tolerance, cognitive remediation, safety during functional transfers/mobility and sitting balance. Pt req total cues for orientation to place, situation and time, however, disagreeing with therapist that he was not at [facility] despite use of external cues. Attempted to engage pt in t/f sit<>stand for ambulation w/ RW, however, perseverating on tags of RW as well as other equipment in room demonstrating language of confusion with naming . Pt pointing to DME in room stating, "what are those people doing in here?" Uncertain if hallucination or continued language of confusion. Therapist storing DME in bathroom for improved sustained attention. After 69mn, pt eventually initiating t/f sit>stand with min A, walking 6' w/ mod L HHA to chair directly in front of him. With prompting, pt stating that his knees were bothering him and that he did not want to stand. Pt req min A for squat pivot t/f back to recliner and transported to therapy gym. Pt with poor  initiation and attention shooting basketball, however, increased alertness and attention to tossing basketball at trampoline. Following max cues for initiation, pt req min cues to attend to timed ball toss at trampoline for 170m. Pt unable to repeat task again with basketball or weighted ball due to distractibility. Pt req max cues to engage and sustain attention to seated basketball at end of session. Pt transported back to room, left with recliner w/ all needs in reach and quick release belt in place.   Therapy Documentation Precautions:  Precautions Precautions: Fall Precaution Comments: PEG/abdominal precautions, trach, Bilateral thigh high TED hose  Restrictions Weight Bearing Restrictions: No Pain: Pain Assessment Pain Assessment: No/denies pain  See FIM for current functional status  Therapy/Group: Individual Therapy  KiGilmore Laroche/18/2015, 12:04 PM

## 2014-08-27 NOTE — Progress Notes (Signed)
Speech Language Pathology Daily Session Note  Patient Details  Name: Tony Davis MRN: [id number] Date of Birth: 07/12/1961  Today's Date: 08/27/2014 SLP Individual Time: 0800-0900 SLP Individual Time Calculation (min): 60 min  Short Term Goals: Week 4: SLP Short Term Goal 1 (Week 4): Patient will sustain attention to basic self-care task for ~5 minutes with Max multimodal cues.  SLP Short Term Goal 2 (Week 4): Patient will demonstrate use of call bell with Max multimodal cues. SLP Short Term Goal 3 (Week 4): Patient will follow basic, 1-step directions during self-care tasks with Min multimodal cues. SLP Short Term Goal 4 (Week 4): Patient will initiate verbal response to yes/no questions with Min multimodal cues. SLP Short Term Goal 5 (Week 4): Patient will initiate functional tasks with Mod A multimodal cues.  SLP Short Term Goal 6 (Week 4): Patient will utilize swallowing compensatory strategies with current diet to minimize overt s/s of aspiration with supervision multimodal cues.   Skilled Therapeutic Interventions: Skilled treatment session focused on dysphagia and cognitive-linguistic goals. SLP facilitated session by initially providing Min A multimodal cues for use of small bites and a slow rate as well as self-feeding which progressed to Max A by end of session due to fatigue.  Patient demonstrated an overt cough X 1, suspect cough was strong enough to clear any penetrates/aspirates. Patient required Max A multimodal cues to verbally respond to basic questions and total A multimodal cues for recall of events from previous therapy session and orientation to time, place and situation. This clinician utilized an external aid to assist with recall, however, patient required cues for oral reading, suspect due to visual and attention deficits. Patient left in wheelchair with quick release in place and all needs within reach. Continue with current plan of care.    FIM:   Comprehension Comprehension Mode: Auditory Comprehension: 3-Understands basic 50 - 74% of the time/requires cueing 25 - 50%  of the time Expression Expression Mode: Verbal Expression: 2-Expresses basic 25 - 49% of the time/requires cueing 50 - 75% of the time. Uses single words/gestures. Social Interaction Social Interaction: 2-Interacts appropriately 25 - 49% of time - Needs frequent redirection. Problem Solving Problem Solving: 1-Solves basic less than 25% of the time - needs direction nearly all the time or does not effectively solve problems and may need a restraint for safety Memory Memory: 1-Recognizes or recalls less than 25% of the time/requires cueing greater than 75% of the time FIM - Eating Eating Activity: 4: Help with managing cup/glass;4: Helper occasionally scoops food on utensil;5: Needs verbal cues/supervision  Pain Pain Assessment Pain Assessment: No/denies pain  Therapy/Group: Individual Therapy  Tage Feggins, [place] 08/27/2014, 11:55 AM

## 2014-08-27 NOTE — Progress Notes (Signed)
Subjective/Complaints:   Came into room and patient had hands up in the air---"i'm praying".  Review of Systems -remains limited due to cognitive status   Objective: Vital Signs: Blood pressure 155/92, pulse 77, temperature 98 F (36.7 C), temperature source Oral, resp. rate 18, weight 117.346 kg (258 lb 11.2 oz), SpO2 99.00%. No results found. Results for orders placed during the hospital encounter of 07/30/14 (from the past 72 hour(s))  GLUCOSE, CAPILLARY     Status: None   Collection Time    08/24/14  3:58 PM      Result Value Ref Range   Glucose-Capillary 95  70 - 99 mg/dL  GLUCOSE, CAPILLARY     Status: None   Collection Time    08/24/14  9:20 PM      Result Value Ref Range   Glucose-Capillary 99  70 - 99 mg/dL  GLUCOSE, CAPILLARY     Status: None   Collection Time    08/25/14  7:57 AM      Result Value Ref Range   Glucose-Capillary 97  70 - 99 mg/dL   Comment 1 Notify RN    GLUCOSE, CAPILLARY     Status: None   Collection Time    08/25/14 11:23 AM      Result Value Ref Range   Glucose-Capillary 98  70 - 99 mg/dL  GLUCOSE, CAPILLARY     Status: Abnormal   Collection Time    08/25/14  4:35 PM      Result Value Ref Range   Glucose-Capillary 110 (*) 70 - 99 mg/dL  GLUCOSE, CAPILLARY     Status: Abnormal   Collection Time    08/25/14  9:50 PM      Result Value Ref Range   Glucose-Capillary 126 (*) 70 - 99 mg/dL  GLUCOSE, CAPILLARY     Status: Abnormal   Collection Time    08/26/14  6:55 AM      Result Value Ref Range   Glucose-Capillary 101 (*) 70 - 99 mg/dL  GLUCOSE, CAPILLARY     Status: None   Collection Time    08/26/14 11:32 AM      Result Value Ref Range   Glucose-Capillary 92  70 - 99 mg/dL   Comment 1 Notify RN    GLUCOSE, CAPILLARY     Status: None   Collection Time    08/26/14  4:40 PM      Result Value Ref Range   Glucose-Capillary 87  70 - 99 mg/dL  GLUCOSE, CAPILLARY     Status: Abnormal   Collection Time    08/26/14  8:49 PM      Result  Value Ref Range   Glucose-Capillary 105 (*) 70 - 99 mg/dL   Comment 1 Notify RN    GLUCOSE, CAPILLARY     Status: None   Collection Time    08/27/14  6:55 AM      Result Value Ref Range   Glucose-Capillary 81  70 - 99 mg/dL   Comment 1 Notify RN       HEENT: neck clean. Trach site closed Cardio: RRR and no murmur Resp: CTA B/L and unlabored, clear.  GI: BS positive and PEG site clean. Extremity:  Bilateral pedal edema trace to 1+  Skin:   Intact other than above Neuro:   Initiates more. Better attention/awareness.  Abnormal Sensory reduced on left difficult to assess secondary to MS, Abnormal Motor 4 LUE, 3 to 3+ RLE, 3- LLE, 5/5 RUE grossly  Musc/Skel:  Normal GEN NAD   Assessment/Plan: 1. Functional deficits secondary to left Thalamic ICH, Hydrocephalus with Right  shunt which require 3+ hours per day of interdisciplinary therapy in a comprehensive inpatient rehab setting. Physiatrist is providing close team supervision and 24 hour management of active medical problems listed below. Physiatrist and rehab team continue to assess barriers to discharge/monitor patient progress toward functional and medical goals.  FIM: FIM - Bathing Bathing Steps Patient Completed: Chest;Right Arm;Left Arm;Abdomen;Front perineal area;Right upper leg;Left upper leg Bathing: 3: Mod-Patient completes 5-7 49f 10 parts or 50-74%  FIM - Upper Body Dressing/Undressing Upper body dressing/undressing steps patient completed: Thread/unthread right sleeve of pullover shirt/dresss;Thread/unthread left sleeve of pullover shirt/dress;Put head through opening of pull over shirt/dress;Pull shirt over trunk Upper body dressing/undressing: 5: Supervision: Safety issues/verbal cues FIM - Lower Body Dressing/Undressing Lower body dressing/undressing steps patient completed: Thread/unthread right underwear leg;Thread/unthread left underwear leg;Pull underwear up/down;Thread/unthread right pants leg;Thread/unthread left  pants leg;Pull pants up/down Lower body dressing/undressing: 3: Mod-Patient completed 50-74% of tasks  FIM - Toileting Toileting steps completed by patient: Adjust clothing prior to toileting Toileting Assistive Devices: Grab bar or rail for support Toileting: 1: Total-Patient completed zero steps, helper did all 3  FIM - Radio producer Devices: Recruitment consultant Transfers: 4-To toilet/BSC: Min A (steadying Pt. > 75%);4-From toilet/BSC: Min A (steadying Pt. > 75%)  FIM - Control and instrumentation engineer Devices: Walker;Arm rests Bed/Chair Transfer: 4: Bed > Chair or W/C: Min A (steadying Pt. > 75%);4: Chair or W/C > Bed: Min A (steadying Pt. > 75%)  FIM - Locomotion: Wheelchair Distance: 100 Locomotion: Wheelchair: 2: Travels 50 - 149 ft with supervision, cueing or coaxing FIM - Locomotion: Ambulation Locomotion: Ambulation Assistive Devices: Administrator Ambulation/Gait Assistance: 4: Min guard;4: Min assist Locomotion: Ambulation: 4: Travels 150 ft or more with minimal assistance (Pt.>75%)  Comprehension Comprehension Mode: Auditory Comprehension: 3-Understands basic 50 - 74% of the time/requires cueing 25 - 50%  of the time  Expression Expression Mode: Verbal Expression Assistive Devices: 6-Talk trach valve Expression: 3-Expresses basic 50 - 74% of the time/requires cueing 25 - 50% of the time. Needs to repeat parts of sentences.  Social Interaction Social Interaction Mode: Asleep Social Interaction: 2-Interacts appropriately 25 - 49% of time - Needs frequent redirection.  Problem Solving Problem Solving Mode: Asleep Problem Solving: 2-Solves basic 25 - 49% of the time - needs direction more than half the time to initiate, plan or complete simple activities  Memory Memory Mode: Asleep Memory: 2-Recognizes or recalls 25 - 49% of the time/requires cueing 51 - 75% of the time  Medical Problem List and Plan:  1.  Functional deficits secondary to Left thalamic bleed with hydrocephalus.  2. DVT Prophylaxis/Anticoagulation/has IVCF: Pharmaceutical: Lovenox-  -TEDs for edema, elevation  -hx of dvt  3. Chronic B-knee/Pain Management: Prn oxycodone.   Voltaren gel for knees.  -poorly-compliant pain patient in my practice 4. Mood: Monitor as mentation improves.  5. Neuropsych: This patient is not capable of making decisions on his own behalf.   -continue low dose ritalin---will not push higher given CV history 6. Skin/Wound Care:   Continue air mattress overlay.   7. DM type 2: Will monitor BS with ac/hs checks. Continue Levemir bid    -reasonable control still  8. Enterobacter tracheobronchitis: resolved 7. HTN/CAD:  -metoprolol bid  -continue HCTZ  -increased norvasc to 10mg  8. ABLA: on iron supplement.   9. Dysphagia: on d1 thins, still  eating well  -off TF  -advance diet per SLP 10. Bladder- I challenged him to call the RN when he feels ready to empty----remains quite incontinent   LOS (Days) 28 A FACE TO FACE EVALUATION WAS PERFORMED  Donie Moulton T 08/27/2014, 8:02 AM

## 2014-08-28 ENCOUNTER — Inpatient Hospital Stay (HOSPITAL_COMMUNITY): Payer: Medicare Other

## 2014-08-28 DIAGNOSIS — M1710 Unilateral primary osteoarthritis, unspecified knee: Secondary | ICD-10-CM

## 2014-08-28 DIAGNOSIS — Z5189 Encounter for other specified aftercare: Secondary | ICD-10-CM

## 2014-08-28 DIAGNOSIS — I10 Essential (primary) hypertension: Secondary | ICD-10-CM

## 2014-08-28 DIAGNOSIS — IMO0002 Reserved for concepts with insufficient information to code with codable children: Secondary | ICD-10-CM

## 2014-08-28 DIAGNOSIS — J9600 Acute respiratory failure, unspecified whether with hypoxia or hypercapnia: Secondary | ICD-10-CM

## 2014-08-28 DIAGNOSIS — I619 Nontraumatic intracerebral hemorrhage, unspecified: Secondary | ICD-10-CM

## 2014-08-28 LAB — GLUCOSE, CAPILLARY
GLUCOSE-CAPILLARY: 83 mg/dL (ref 70–99)
GLUCOSE-CAPILLARY: 118 mg/dL — AB (ref 70–99)
Glucose-Capillary: 109 mg/dL — ABNORMAL HIGH (ref 70–99)
Glucose-Capillary: 145 mg/dL — ABNORMAL HIGH (ref 70–99)

## 2014-08-28 NOTE — Plan of Care (Signed)
Problem: RH BLADDER ELIMINATION Goal: RH STG MANAGE BLADDER WITH ASSISTANCE STG Manage Bladder With max Assistance  Outcome: Not Progressing Total assist at this time

## 2014-08-28 NOTE — Progress Notes (Signed)
Occupational Therapy Session Note  Patient Details  Name: DAYTONA RETANA MRN: [id number] Date of Birth: 1961-06-11  Today's Date: 08/28/2014 OT Individual Time: 1415-1500 OT Individual Time Calculation (min): 45 min    Short Term Goals: Week 4:  OT Short Term Goal 1 (Week 4): Pt will perform tub/shower transfer with min A OT Short Term Goal 2 (Week 4): Pt will perform bathing tasks with min A OT Short Term Goal 3 (Week 4): Pt will perform toilet transfers with min A  Skilled Therapeutic Interventions/Progress Updates: Therapeutic activity with emphasis on sustained attention, dynamic sitting balance, improved coordination and initiation, and functional mobility.   Pt received at RN station in recliner and returned to room for transfer to w/c for planned activity.  With wife present, pt was escorted to outdoor patio and encouraged to complete transfers to/from park bench then to/from interior chair table at dining area.    Pt transferred to bench from w/c with steadying assist and then ambulated approx 150' with OT providing mod assist for gait and continuous contact at right torso during mobility.   Pt transferred to chair without armrests in dining area with mod assist d/t pt misjudging distances during initiation of sit.   Pt transferred back to w/c and was escorted to ortho gym and completed 15 min of basket drills (passing and dribbling) with therapist and spouse with continuous prompts and cues to decrease rate of reaction and to increase velocity of passes.   Pt returned to room with spouse attending to grooming at end of session.    Therapy Documentation Precautions:  Precautions Precautions: Fall Precaution Comments: PEG/abdominal precautions, trach, Bilateral thigh high TED hose  Restrictions Weight Bearing Restrictions: No  Vital Signs: Therapy Vitals Temp: 98.3 F (36.8 C) Temp src: Oral Pulse Rate: 95 Resp: 20 BP: 134/86 mmHg Patient Position (if appropriate):  Sitting Oxygen Therapy SpO2: 100 % O2 Device: None (Room air)  Pain: No/denies   See FIM for current functional status  Therapy/Group: Individual Therapy  [place] 08/28/2014, 3:16 PM

## 2014-08-28 NOTE — Progress Notes (Signed)
Subjective/Complaints:   Sitting at nsg station Oriented to hospital but not time Review of Systems -remains limited due to cognitive status   Objective: Vital Signs: Blood pressure 135/89, pulse 91, temperature 98.4 F (36.9 C), temperature source Oral, resp. rate 20, weight 117.346 kg (258 lb 11.2 oz), SpO2 99.00%. No results found. Results for orders placed during the hospital encounter of 07/30/14 (from the past 72 hour(s))  GLUCOSE, CAPILLARY     Status: None   Collection Time    08/25/14 11:23 AM      Result Value Ref Range   Glucose-Capillary 98  70 - 99 mg/dL  GLUCOSE, CAPILLARY     Status: Abnormal   Collection Time    08/25/14  4:35 PM      Result Value Ref Range   Glucose-Capillary 110 (*) 70 - 99 mg/dL  GLUCOSE, CAPILLARY     Status: Abnormal   Collection Time    08/25/14  9:50 PM      Result Value Ref Range   Glucose-Capillary 126 (*) 70 - 99 mg/dL  GLUCOSE, CAPILLARY     Status: Abnormal   Collection Time    08/26/14  6:55 AM      Result Value Ref Range   Glucose-Capillary 101 (*) 70 - 99 mg/dL  GLUCOSE, CAPILLARY     Status: None   Collection Time    08/26/14 11:32 AM      Result Value Ref Range   Glucose-Capillary 92  70 - 99 mg/dL   Comment 1 Notify RN    GLUCOSE, CAPILLARY     Status: None   Collection Time    08/26/14  4:40 PM      Result Value Ref Range   Glucose-Capillary 87  70 - 99 mg/dL  GLUCOSE, CAPILLARY     Status: Abnormal   Collection Time    08/26/14  8:49 PM      Result Value Ref Range   Glucose-Capillary 105 (*) 70 - 99 mg/dL   Comment 1 Notify RN    GLUCOSE, CAPILLARY     Status: None   Collection Time    08/27/14  6:55 AM      Result Value Ref Range   Glucose-Capillary 81  70 - 99 mg/dL   Comment 1 Notify RN    GLUCOSE, CAPILLARY     Status: None   Collection Time    08/27/14 11:57 AM      Result Value Ref Range   Glucose-Capillary 97  70 - 99 mg/dL   Comment 1 Notify RN    GLUCOSE, CAPILLARY     Status: None    Collection Time    08/27/14  4:47 PM      Result Value Ref Range   Glucose-Capillary 82  70 - 99 mg/dL  GLUCOSE, CAPILLARY     Status: Abnormal   Collection Time    08/27/14  8:53 PM      Result Value Ref Range   Glucose-Capillary 121 (*) 70 - 99 mg/dL   Comment 1 Notify RN    GLUCOSE, CAPILLARY     Status: None   Collection Time    08/28/14  7:17 AM      Result Value Ref Range   Glucose-Capillary 83  70 - 99 mg/dL   Comment 1 Notify RN       HEENT: neck clean. Trach site closed Cardio: RRR and no murmur Resp: CTA B/L and unlabored, clear.  GI: BS positive and PEG site  clean. Extremity:  Bilateral pedal edema trace to 1+  Skin:   Intact other than above Neuro:   Initiates more. Better attention/awareness.  Abnormal Sensory reduced on left difficult to assess secondary to MS, Abnormal Motor 4 LUE, 3 to 3+ RLE, 3- LLE, 5/5 RUE grossly  Musc/Skel:  Normal GEN NAD   Assessment/Plan: 1. Functional deficits secondary to left Thalamic ICH, Hydrocephalus with Right  shunt which require 3+ hours per day of interdisciplinary therapy in a comprehensive inpatient rehab setting. Physiatrist is providing close team supervision and 24 hour management of active medical problems listed below. Physiatrist and rehab team continue to assess barriers to discharge/monitor patient progress toward functional and medical goals.  FIM: FIM - Bathing Bathing Steps Patient Completed: Chest;Right Arm;Left Arm;Abdomen;Front perineal area;Right upper leg;Left upper leg Bathing: 3: Mod-Patient completes 5-7 65f 10 parts or 50-74%  FIM - Upper Body Dressing/Undressing Upper body dressing/undressing steps patient completed: Thread/unthread right sleeve of pullover shirt/dresss;Thread/unthread left sleeve of pullover shirt/dress;Put head through opening of pull over shirt/dress;Pull shirt over trunk Upper body dressing/undressing: 5: Supervision: Safety issues/verbal cues FIM - Lower Body  Dressing/Undressing Lower body dressing/undressing steps patient completed: Thread/unthread right underwear leg;Thread/unthread left underwear leg;Pull underwear up/down;Thread/unthread right pants leg;Thread/unthread left pants leg;Pull pants up/down Lower body dressing/undressing: 3: Mod-Patient completed 50-74% of tasks  FIM - Toileting Toileting steps completed by patient: Adjust clothing prior to toileting Toileting Assistive Devices: Grab bar or rail for support Toileting: 1: Total-Patient completed zero steps, helper did all 3  FIM - Radio producer Devices: Recruitment consultant Transfers: 4-To toilet/BSC: Min A (steadying Pt. > 75%);4-From toilet/BSC: Min A (steadying Pt. > 75%)  FIM - Bed/Chair Transfer Bed/Chair Transfer Assistive Devices: Arm rests Bed/Chair Transfer: 4: Bed > Chair or W/C: Min A (steadying Pt. > 75%);4: Chair or W/C > Bed: Min A (steadying Pt. > 75%)  FIM - Locomotion: Wheelchair Distance: 100 Locomotion: Wheelchair: 0: Activity did not occur FIM - Locomotion: Ambulation Locomotion: Ambulation Assistive Devices: Other (comment) (HHA) Ambulation/Gait Assistance: 3: Mod assist Locomotion: Ambulation: 1: Travels less than 50 ft with moderate assistance (Pt: 50 - 74%)  Comprehension Comprehension Mode: Auditory Comprehension: 3-Understands basic 50 - 74% of the time/requires cueing 25 - 50%  of the time  Expression Expression Mode: Verbal Expression Assistive Devices: 6-Talk trach valve Expression: 2-Expresses basic 25 - 49% of the time/requires cueing 50 - 75% of the time. Uses single words/gestures.  Social Interaction Social Interaction Mode: Asleep Social Interaction: 2-Interacts appropriately 25 - 49% of time - Needs frequent redirection.  Problem Solving Problem Solving Mode: Asleep Problem Solving: 1-Solves basic less than 25% of the time - needs direction nearly all the time or does not effectively solve problems  and may need a restraint for safety  Memory Memory Mode: Asleep Memory: 1-Recognizes or recalls less than 25% of the time/requires cueing greater than 75% of the time  Medical Problem List and Plan:  1. Functional deficits secondary to Left thalamic bleed with hydrocephalus.  2. DVT Prophylaxis/Anticoagulation/has IVCF: Pharmaceutical: Lovenox-  -TEDs for edema, elevation  -hx of dvt  3. Chronic B-knee/Pain Management: Prn oxycodone.   Voltaren gel for knees.  -poorly-compliant pain patient in my practice 4. Mood: Monitor as mentation improves.  5. Neuropsych: This patient is not capable of making decisions on his own behalf.   -continue low dose ritalin---will not push higher given CV history 6. Skin/Wound Care:   Continue air mattress overlay.   7. DM type  2: Will monitor BS with ac/hs checks. Continue Levemir bid    -reasonable control still  8. Enterobacter tracheobronchitis: resolved 7. HTN/CAD:  -metoprolol bid  -continue HCTZ  -increased norvasc to 10mg  8. ABLA: on iron supplement.   9. Dysphagia: on d1 thins, still eating well  -off TF  -advance diet per SLP 10. Bladder-timed toilet   LOS (Days) 29 A FACE TO FACE EVALUATION WAS PERFORMED  Ezrah Panning E 08/28/2014, 11:03 AM

## 2014-08-29 ENCOUNTER — Inpatient Hospital Stay (HOSPITAL_COMMUNITY): Payer: Medicare Other | Admitting: *Deleted

## 2014-08-29 LAB — GLUCOSE, CAPILLARY
GLUCOSE-CAPILLARY: 111 mg/dL — AB (ref 70–99)
GLUCOSE-CAPILLARY: 111 mg/dL — AB (ref 70–99)
GLUCOSE-CAPILLARY: 88 mg/dL (ref 70–99)
GLUCOSE-CAPILLARY: 143 mg/dL — AB (ref 70–99)

## 2014-08-29 NOTE — Progress Notes (Signed)
Physical Therapy Session Note  Patient Details  Name: Tony Davis MRN: [id number] Date of Birth: February 15, 1961  Today's Date: 08/29/2014 PT Individual Time: 1100-1141 PT Individual Time Calculation (min): 41 min   Short Term Goals: Week 5:  PT Short Term Goal 1 (Week 5): STGs-LTGs due to anticipated LOS  Skilled Therapeutic Interventions/Progress Updates:    Patient received sitting in recliner at RN station. Patient's wife, daughter, and daughter's friend present for session, which appears to motivate patient as he requires only min-mod cues for initiation and smiling whenever he looks at them. Session focused on initiation, sustained attention, and command following with functional mobility tasks. Patient performing all sit<>stand transfers with minA and mod cues for initiation. Functional ambulation 320' x1 and 70'x1 with RW and min guard progressing to close supervision. Patient requires therapist to facilitate maintaining RW close to patient as he tends to push it too far anteriorly and maintains BOS outside of RW. Patient with decreased step/stride length, intermittent step to pattern with decreased weight bearing on L LE. Additionally, patient demonstrates L foot supination, spending majority of stance time on L LE on the lateral border of foot.   Stair negotiation x6 steps with B handrails ascending initially, progressing to negotiation sideways with one rail for remainder of up/down steps, min cues for initiation and proper sequencing.  During second bout of ambulation, distance cut short secondary to one of patient's visitors passing out and falling. This therapist tending to visitor and therefore ambulation ended prematurely and patient missed 19 min of session.  Patient left sitting in recliner in day room with wife present.  Therapy Documentation Precautions:  Precautions Precautions: Fall Precaution Comments: PEG/abdominal precautions, Bilateral thigh high TED hose   Restrictions Weight Bearing Restrictions: No General: PT Amount of Missed Time (min): 19 Minutes PT Missed Treatment Reason: Other (Comment) (patient's family member passed out and fell, rapid response called; therapist tending to visitor) Pain: Pain Assessment Pain Assessment: No/denies pain Pain Score: 0-No pain Locomotion : Ambulation Ambulation/Gait Assistance: 4: Min guard;5: Supervision   See FIM for current functional status  Therapy/Group: Individual Therapy  Lillia Abed. Willean Schurman, PT, DPT 08/29/2014, 12:10 PM

## 2014-08-29 NOTE — Progress Notes (Signed)
Subjective/Complaints: In bed no c/os, tired ,watching TV Oriented to hospital but not time Review of Systems -remains limited due to cognitive status   Objective: Vital Signs: Blood pressure 156/89, pulse 91, temperature 98 F (36.7 C), temperature source Oral, resp. rate 20, weight 117.346 kg (258 lb 11.2 oz), SpO2 98.00%. No results found. Results for orders placed during the hospital encounter of 07/30/14 (from the past 72 hour(s))  GLUCOSE, CAPILLARY     Status: None   Collection Time    08/26/14 11:32 AM      Result Value Ref Range   Glucose-Capillary 92  70 - 99 mg/dL   Comment 1 Notify RN    GLUCOSE, CAPILLARY     Status: None   Collection Time    08/26/14  4:40 PM      Result Value Ref Range   Glucose-Capillary 87  70 - 99 mg/dL  GLUCOSE, CAPILLARY     Status: Abnormal   Collection Time    08/26/14  8:49 PM      Result Value Ref Range   Glucose-Capillary 105 (*) 70 - 99 mg/dL   Comment 1 Notify RN    GLUCOSE, CAPILLARY     Status: None   Collection Time    08/27/14  6:55 AM      Result Value Ref Range   Glucose-Capillary 81  70 - 99 mg/dL   Comment 1 Notify RN    GLUCOSE, CAPILLARY     Status: None   Collection Time    08/27/14 11:57 AM      Result Value Ref Range   Glucose-Capillary 97  70 - 99 mg/dL   Comment 1 Notify RN    GLUCOSE, CAPILLARY     Status: None   Collection Time    08/27/14  4:47 PM      Result Value Ref Range   Glucose-Capillary 82  70 - 99 mg/dL  GLUCOSE, CAPILLARY     Status: Abnormal   Collection Time    08/27/14  8:53 PM      Result Value Ref Range   Glucose-Capillary 121 (*) 70 - 99 mg/dL   Comment 1 Notify RN    GLUCOSE, CAPILLARY     Status: None   Collection Time    08/28/14  7:17 AM      Result Value Ref Range   Glucose-Capillary 83  70 - 99 mg/dL   Comment 1 Notify RN    GLUCOSE, CAPILLARY     Status: Abnormal   Collection Time    08/28/14 11:22 AM      Result Value Ref Range   Glucose-Capillary 109 (*) 70 - 99 mg/dL   GLUCOSE, CAPILLARY     Status: Abnormal   Collection Time    08/28/14  4:36 PM      Result Value Ref Range   Glucose-Capillary 118 (*) 70 - 99 mg/dL  GLUCOSE, CAPILLARY     Status: Abnormal   Collection Time    08/28/14  8:50 PM      Result Value Ref Range   Glucose-Capillary 145 (*) 70 - 99 mg/dL   Comment 1 Notify RN    GLUCOSE, CAPILLARY     Status: Abnormal   Collection Time    08/29/14  6:41 AM      Result Value Ref Range   Glucose-Capillary 111 (*) 70 - 99 mg/dL   Comment 1 Notify RN       HEENT: neck clean. Trach site closed Cardio:  RRR and no murmur Resp: CTA B/L and unlabored, clear.  GI: BS positive and PEG site clean. Extremity:  Bilateral pedal edema trace to 1+  Skin:   Intact other than above Neuro:   Initiates more. Better attention/awareness.  Abnormal Sensory reduced on left difficult to assess secondary to MS, Abnormal Motor 4 LUE, 3 to 3+ RLE, 3- LLE, 5/5 RUE grossly  Musc/Skel:  Normal GEN NAD   Assessment/Plan: 1. Functional deficits secondary to left Thalamic ICH, Hydrocephalus with Right  shunt which require 3+ hours per day of interdisciplinary therapy in a comprehensive inpatient rehab setting. Physiatrist is providing close team supervision and 24 hour management of active medical problems listed below. Physiatrist and rehab team continue to assess barriers to discharge/monitor patient progress toward functional and medical goals.  FIM: FIM - Bathing Bathing Steps Patient Completed: Chest;Right Arm;Left Arm;Abdomen;Front perineal area;Right upper leg;Left upper leg Bathing: 3: Mod-Patient completes 5-7 73f 10 parts or 50-74%  FIM - Upper Body Dressing/Undressing Upper body dressing/undressing steps patient completed: Thread/unthread right sleeve of pullover shirt/dresss;Thread/unthread left sleeve of pullover shirt/dress;Put head through opening of pull over shirt/dress;Pull shirt over trunk Upper body dressing/undressing: 5: Supervision: Safety  issues/verbal cues FIM - Lower Body Dressing/Undressing Lower body dressing/undressing steps patient completed: Thread/unthread right underwear leg;Thread/unthread left underwear leg;Pull underwear up/down;Thread/unthread right pants leg;Thread/unthread left pants leg;Pull pants up/down Lower body dressing/undressing: 3: Mod-Patient completed 50-74% of tasks  FIM - Toileting Toileting steps completed by patient: Adjust clothing prior to toileting Toileting Assistive Devices: Grab bar or rail for support Toileting: 1: Total-Patient completed zero steps, helper did all 3  FIM - Radio producer Devices: Recruitment consultant Transfers: 4-To toilet/BSC: Min A (steadying Pt. > 75%);4-From toilet/BSC: Min A (steadying Pt. > 75%)  FIM - Bed/Chair Transfer Bed/Chair Transfer Assistive Devices: Arm rests Bed/Chair Transfer: 4: Bed > Chair or W/C: Min A (steadying Pt. > 75%);4: Chair or W/C > Bed: Min A (steadying Pt. > 75%)  FIM - Locomotion: Wheelchair Distance: 100 Locomotion: Wheelchair: 0: Activity did not occur FIM - Locomotion: Ambulation Locomotion: Ambulation Assistive Devices: Other (comment) (HHA) Ambulation/Gait Assistance: 3: Mod assist Locomotion: Ambulation: 1: Travels less than 50 ft with moderate assistance (Pt: 50 - 74%)  Comprehension Comprehension Mode: Auditory Comprehension: 3-Understands basic 50 - 74% of the time/requires cueing 25 - 50%  of the time  Expression Expression Mode: Verbal Expression Assistive Devices: 6-Talk trach valve Expression: 2-Expresses basic 25 - 49% of the time/requires cueing 50 - 75% of the time. Uses single words/gestures.  Social Interaction Social Interaction Mode: Asleep Social Interaction: 2-Interacts appropriately 25 - 49% of time - Needs frequent redirection.  Problem Solving Problem Solving Mode: Asleep Problem Solving: 1-Solves basic less than 25% of the time - needs direction nearly all the time or  does not effectively solve problems and may need a restraint for safety  Memory Memory Mode: Asleep Memory: 1-Recognizes or recalls less than 25% of the time/requires cueing greater than 75% of the time  Medical Problem List and Plan:  1. Functional deficits secondary to Left thalamic bleed with hydrocephalus.  2. DVT Prophylaxis/Anticoagulation/has IVCF: Pharmaceutical: Lovenox-  -TEDs for edema, elevation  -hx of dvt  3. Chronic B-knee/Pain Management: Prn oxycodone.   Voltaren gel for knees.  -poorly-compliant pain patient in my practice 4. Mood: Monitor as mentation improves.  5. Neuropsych: This patient is not capable of making decisions on his own behalf.   -continue low dose ritalin---will not push higher  given CV history 6. Skin/Wound Care:   Continue air mattress overlay.   7. DM type 2: Will monitor BS with ac/hs checks. Continue Levemir bid    -reasonable control still  8. Enterobacter tracheobronchitis: resolved 7. HTN/CAD:  -metoprolol bid  -continue HCTZ  -increased norvasc to 10mg  8. ABLA: on iron supplement.   9. Dysphagia: on d1 thins, still eating well  -off TF  -advance diet per SLP 10. Bladder-timed toilet   LOS (Days) 30 A FACE TO FACE EVALUATION WAS PERFORMED  KIRSTEINS,ANDREW E 08/29/2014, 10:04 AM

## 2014-08-30 ENCOUNTER — Inpatient Hospital Stay (HOSPITAL_COMMUNITY): Payer: Medicare Other

## 2014-08-30 ENCOUNTER — Inpatient Hospital Stay (HOSPITAL_COMMUNITY): Payer: Medicare Other | Admitting: Speech Pathology

## 2014-08-30 ENCOUNTER — Encounter (HOSPITAL_COMMUNITY): Payer: Medicare Other

## 2014-08-30 LAB — GLUCOSE, CAPILLARY
Glucose-Capillary: 114 mg/dL — ABNORMAL HIGH (ref 70–99)
Glucose-Capillary: 104 mg/dL — ABNORMAL HIGH (ref 70–99)
Glucose-Capillary: 100 mg/dL — ABNORMAL HIGH (ref 70–99)
Glucose-Capillary: 92 mg/dL (ref 70–99)

## 2014-08-30 NOTE — Progress Notes (Signed)
Subjective/Complaints: No new complaints. Denies pain Review of Systems -remains limited due to cognitive status   Objective: Vital Signs: Blood pressure 118/75, pulse 91, temperature 98.1 F (36.7 C), temperature source Oral, resp. rate 20, weight 117.346 kg (258 lb 11.2 oz), SpO2 96.00%. No results found. Results for orders placed during the hospital encounter of 07/30/14 (from the past 72 hour(s))  GLUCOSE, CAPILLARY     Status: None   Collection Time    08/27/14 11:57 AM      Result Value Ref Range   Glucose-Capillary 97  70 - 99 mg/dL   Comment 1 Notify RN    GLUCOSE, CAPILLARY     Status: None   Collection Time    08/27/14  4:47 PM      Result Value Ref Range   Glucose-Capillary 82  70 - 99 mg/dL  GLUCOSE, CAPILLARY     Status: Abnormal   Collection Time    08/27/14  8:53 PM      Result Value Ref Range   Glucose-Capillary 121 (*) 70 - 99 mg/dL   Comment 1 Notify RN    GLUCOSE, CAPILLARY     Status: None   Collection Time    08/28/14  7:17 AM      Result Value Ref Range   Glucose-Capillary 83  70 - 99 mg/dL   Comment 1 Notify RN    GLUCOSE, CAPILLARY     Status: Abnormal   Collection Time    08/28/14 11:22 AM      Result Value Ref Range   Glucose-Capillary 109 (*) 70 - 99 mg/dL  GLUCOSE, CAPILLARY     Status: Abnormal   Collection Time    08/28/14  4:36 PM      Result Value Ref Range   Glucose-Capillary 118 (*) 70 - 99 mg/dL  GLUCOSE, CAPILLARY     Status: Abnormal   Collection Time    08/28/14  8:50 PM      Result Value Ref Range   Glucose-Capillary 145 (*) 70 - 99 mg/dL   Comment 1 Notify RN    GLUCOSE, CAPILLARY     Status: Abnormal   Collection Time    08/29/14  6:41 AM      Result Value Ref Range   Glucose-Capillary 111 (*) 70 - 99 mg/dL   Comment 1 Notify RN    GLUCOSE, CAPILLARY     Status: Abnormal   Collection Time    08/29/14 12:01 PM      Result Value Ref Range   Glucose-Capillary 111 (*) 70 - 99 mg/dL  GLUCOSE, CAPILLARY     Status: None    Collection Time    08/29/14  4:41 PM      Result Value Ref Range   Glucose-Capillary 88  70 - 99 mg/dL  GLUCOSE, CAPILLARY     Status: Abnormal   Collection Time    08/29/14  8:46 PM      Result Value Ref Range   Glucose-Capillary 143 (*) 70 - 99 mg/dL   Comment 1 Notify RN    GLUCOSE, CAPILLARY     Status: Abnormal   Collection Time    08/30/14  7:34 AM      Result Value Ref Range   Glucose-Capillary 114 (*) 70 - 99 mg/dL     HEENT: neck clean. Trach site closed Cardio: RRR and no murmur Resp: CTA B/L and unlabored, clear.  GI: BS positive and PEG site clean. Extremity:  Bilateral pedal edema trace  to 1+  Skin:   Intact other than above Neuro:   Initiates more. Better attention/awareness.  Abnormal Sensory reduced on left difficult to assess secondary to MS, Abnormal Motor 4 LUE, 3 to 3+ RLE, 3- LLE, 5/5 RUE grossly  Musc/Skel:  Normal GEN NAD   Assessment/Plan: 1. Functional deficits secondary to left Thalamic ICH, Hydrocephalus with Right  shunt which require 3+ hours per day of interdisciplinary therapy in a comprehensive inpatient rehab setting. Physiatrist is providing close team supervision and 24 hour management of active medical problems listed below. Physiatrist and rehab team continue to assess barriers to discharge/monitor patient progress toward functional and medical goals.  FIM: FIM - Bathing Bathing Steps Patient Completed: Chest;Right Arm;Left Arm;Abdomen;Front perineal area;Right upper leg;Left upper leg Bathing: 3: Mod-Patient completes 5-7 41f 10 parts or 50-74%  FIM - Upper Body Dressing/Undressing Upper body dressing/undressing steps patient completed: Thread/unthread right sleeve of pullover shirt/dresss;Thread/unthread left sleeve of pullover shirt/dress;Put head through opening of pull over shirt/dress;Pull shirt over trunk Upper body dressing/undressing: 5: Supervision: Safety issues/verbal cues FIM - Lower Body Dressing/Undressing Lower body  dressing/undressing steps patient completed: Thread/unthread right underwear leg;Thread/unthread left underwear leg;Pull underwear up/down;Thread/unthread right pants leg;Thread/unthread left pants leg;Pull pants up/down Lower body dressing/undressing: 3: Mod-Patient completed 50-74% of tasks  FIM - Toileting Toileting steps completed by patient: Adjust clothing prior to toileting Toileting Assistive Devices: Grab bar or rail for support Toileting: 1: Total-Patient completed zero steps, helper did all 3  FIM - Radio producer Devices: Bedside commode Toilet Transfers: 3-To toilet/BSC: Mod A (lift or lower assist);3-From toilet/BSC: Mod A (lift or lower assist)  FIM - Bed/Chair Transfer Bed/Chair Transfer Assistive Devices: Arm rests;Walker Bed/Chair Transfer: 4: Bed > Chair or W/C: Min A (steadying Pt. > 75%);4: Chair or W/C > Bed: Min A (steadying Pt. > 75%)  FIM - Locomotion: Wheelchair Distance: 100 Locomotion: Wheelchair: 0: Activity did not occur FIM - Locomotion: Ambulation Locomotion: Ambulation Assistive Devices: Administrator Ambulation/Gait Assistance: 4: Min guard;5: Supervision Locomotion: Ambulation: 4: Travels 150 ft or more with minimal assistance (Pt.>75%)  Comprehension Comprehension Mode: Auditory Comprehension: 3-Understands basic 50 - 74% of the time/requires cueing 25 - 50%  of the time  Expression Expression Mode: Verbal Expression Assistive Devices: 6-Talk trach valve Expression: 2-Expresses basic 25 - 49% of the time/requires cueing 50 - 75% of the time. Uses single words/gestures.  Social Interaction Social Interaction Mode: Asleep Social Interaction: 2-Interacts appropriately 25 - 49% of time - Needs frequent redirection.  Problem Solving Problem Solving Mode: Asleep Problem Solving: 1-Solves basic less than 25% of the time - needs direction nearly all the time or does not effectively solve problems and may need a  restraint for safety  Memory Memory Mode: Asleep Memory: 1-Recognizes or recalls less than 25% of the time/requires cueing greater than 75% of the time  Medical Problem List and Plan:  1. Functional deficits secondary to Left thalamic bleed with hydrocephalus.  2. DVT Prophylaxis/Anticoagulation/has IVCF: Pharmaceutical: Lovenox-  -TEDs for edema, elevation  -hx of dvt  3. Chronic B-knee/Pain Management: Prn oxycodone.   Voltaren gel for knees.  -poorly-compliant pain patient in my practice 4. Mood: Monitor as mentation improves.  5. Neuropsych: This patient is not capable of making decisions on his own behalf.   -continue   ritalin---will not push higher given CV history 6. Skin/Wound Care:   Continue air mattress overlay.   7. DM type 2: Will monitor BS with ac/hs checks. Continue Levemir  bid    -reasonable control still  8. Enterobacter tracheobronchitis: resolved 7. HTN/CAD:  -metoprolol bid  -continue HCTZ  -increased norvasc to 10mg  8. ABLA: on iron supplement.   9. Dysphagia: on d1 thins, still eating well  -off TF  -advance diet per SLP 10. Bladder-timed toilet   LOS (Days) 31 A FACE TO FACE EVALUATION WAS PERFORMED  SWARTZ,ZACHARY T 08/30/2014, 8:21 AM

## 2014-08-30 NOTE — Progress Notes (Signed)
Occupational Therapy Session Note  Patient Details  Name: HAWARD POPE MRN: [id number] Date of Birth: [birth year]  Today's Date: 08/30/2014 OT Individual Time: 9702-6378 OT Individual Time Calculation (min): 45 min    Short Term Goals: Week 4:  OT Short Term Goal 1 (Week 4): Pt will perform tub/shower transfer with min A OT Short Term Goal 2 (Week 4): Pt will perform bathing tasks with min A OT Short Term Goal 3 (Week 4): Pt will perform toilet transfers with min A  Skilled Therapeutic Interventions/Progress Updates:    Pt engaged in BADL retraining including bathing at shower level and dressing with sit<>stand from w/c.  Pt required max multimodal cues to initiate sitting EOB and min verbal cues to initiate and complete squat pivot transfer to w/c and w/c to tub bench.  Pt required max verbal cues to initiate bathing tasks this morning.  Pt performed tub bench->w/c transfer with close supervision.  Pt initiated dressing tasks when presented with clothing.  Pt required extra time to complete all tasks and max verbal cues to attend to tasks.  Focus on activity tolerance, task initiation, attention to task, task completion, sit<>stand, standing balance, transfers, and safety awareness.  Therapy Documentation Precautions:  Precautions Precautions: Fall Precaution Comments: PEG/abdominal precautions, Bilateral thigh high TED hose  Restrictions Weight Bearing Restrictions: No Pain: Pain Assessment Pain Assessment: No/denies pain  See FIM for current functional status  Therapy/Group: Individual Therapy  Leroy Libman 08/30/2014, 8:15 AM

## 2014-08-30 NOTE — Progress Notes (Signed)
Speech Language Pathology Weekly Progress and Session Note  Patient Details  Name: ZAEEM KANDEL MRN: [id number] Date of Birth: 01/06/1961  Beginning of progress report period: August 23, 2014 End of progress report period: August 30, 2014  Today's Date: 08/30/2014 SLP Individual Time: 0263-7858 SLP Individual Time Calculation (min): 60 min  Short Term Goals: Week 4: SLP Short Term Goal 1 (Week 4): Patient will sustain attention to basic self-care task for ~5 minutes with Max multimodal cues.  SLP Short Term Goal 1 - Progress (Week 4): Met SLP Short Term Goal 2 (Week 4): Patient will demonstrate use of call bell with Max multimodal cues. SLP Short Term Goal 2 - Progress (Week 4): Not met SLP Short Term Goal 3 (Week 4): Patient will follow basic, 1-step directions during self-care tasks with Min multimodal cues. SLP Short Term Goal 3 - Progress (Week 4): Met SLP Short Term Goal 4 (Week 4): Patient will initiate verbal response to yes/no questions with Min multimodal cues. SLP Short Term Goal 4 - Progress (Week 4): Not met SLP Short Term Goal 5 (Week 4): Patient will initiate functional tasks with Mod A multimodal cues.  SLP Short Term Goal 5 - Progress (Week 4): Met SLP Short Term Goal 6 (Week 4): Patient will utilize swallowing compensatory strategies with current diet to minimize overt s/s of aspiration with supervision multimodal cues.  SLP Short Term Goal 6 - Progress (Week 4): Not met    New Short Term Goals: Week 5: SLP Short Term Goal 1 (Week 5): Patient will utilize swallowing compensatory strategies with current diet to minimize overt s/s of aspiration with supervision multimodal cues.  SLP Short Term Goal 2 (Week 5): Patient will initiate functional tasks with Min A multimodal cues.  SLP Short Term Goal 3 (Week 5): Patient will initiate verbal response to yes/no questions with Min multimodal cues. SLP Short Term Goal 4 (Week 5): Patient will follow basic, 1-step  directions during self-care tasks with supervision multimodal cues. SLP Short Term Goal 5 (Week 5): Patient will demonstrate use of call bell with Max multimodal cues. SLP Short Term Goal 6 (Week 5): Patient will sustain attention to basic self-care task for ~5 minutes with Mod multimodal cues.   Weekly Progress Updates: Patient has made functional gains and has met 3 of 6 short term goals this reporting period due to increased ability to follow 1 step commands, increased sustained attention and increased initiation.  Currently, patient requires Mod assist for initiation of functional tasks and following 1-step commands and Max A for verbal expression of wants/needs.   Patient also continues to require total A for orientation, working memory, functional problem solving, safety awareness and intellectual awareness of deficits. Patient is currently consuming Dys. 1 textures with thin liquids with intermittent overt s/s of aspiration and requires Mod A multimodal cues for utilization of swallowing compensatory strategies.  Patient has also been consuming trials of Dys. 2 textures with intermittent overt s/s of aspiration. Patient would benefit from continued skilled SLP intervention to maximize his cognitive-linguistic and swallowing function in order to maximize his functional independence prior to discharge. Continue with current plan of care.    Intensity: Minumum of 1-2 x/day, 30 to 90 minutes Frequency: 5 out of 7 days Duration/Length of Stay: TBD due to SNF placement  Treatment/Interventions: Cognitive remediation/compensation;Speech/Language facilitation;Internal/external aids;Patient/family education;Dysphagia/aspiration precaution training;Functional tasks;Cueing hierarchy   Daily Session Skilled Therapeutic Interventions: Skilled treatment session focused on dysphagia and cognitive-linguistic goals. SLP facilitated session by initially providing  Min A multimodal cues for use of small bites and  a slow rate with self-feeding which progressed to Max A by end of session due to fatigue. Patient consumed breakfast meal of Dys. 1 textures with thin liquids without overt s/s of aspiration. Patient required Max A multimodal cues to verbally respond to basic questions and total A multimodal cues for recall of events from previous therapy session. Patient was independently oriented to place and was able to orient to situation from field of two but required total A to orient to time. Patient sustained attention to self-feeding for ~5 minutes with Max A multimodal cues for redirection. Patient left in room with quick release in place and all needs within reach. Continue with current plan of care.  FIM:  Comprehension Comprehension Mode: Auditory Comprehension: 3-Understands basic 50 - 74% of the time/requires cueing 25 - 50%  of the time Expression Expression Mode: Verbal Expression: 2-Expresses basic 25 - 49% of the time/requires cueing 50 - 75% of the time. Uses single words/gestures. Social Interaction Social Interaction: 2-Interacts appropriately 25 - 49% of time - Needs frequent redirection. Problem Solving Problem Solving: 1-Solves basic less than 25% of the time - needs direction nearly all the time or does not effectively solve problems and may need a restraint for safety Memory Memory: 1-Recognizes or recalls less than 25% of the time/requires cueing greater than 75% of the time FIM - Eating Eating Activity: 5: Supervision/cues Pain Pain Assessment Pain Assessment: No/denies pain  Therapy/Group: Individual Therapy  Anay Walter, [place] 08/30/2014, 12:11 PM

## 2014-08-30 NOTE — Progress Notes (Signed)
NUTRITION FOLLOW-UP  DOCUMENTATION CODES Per approved criteria  -Obesity Unspecified   INTERVENTION: -Continue Magic cup TID between meals, each supplement provides 290 kcal and 9 grams of protein.  -Continue Ensure Complete po BID, each supplement provides 350 kcal and 13 grams of protein.  -Encourage PO intake.  NUTRITION Dx: Inadequate oral intake related to inability to eat as evidenced by NPO status; Resolved.  NEW NUTRITION Dx: Inadequate oral intake now related to dysphagia and cognition as evidenced by dysphagia 1 diet; improving  Goal: Pt to meet >/= 90% of their estimated nutrition needs, met  Monitor:  PO intake, labs, weights, I/O's  53 y.o. male  Admitting Dx: Basal Ganglia Hemorrhage  ASSESSMENT: Tony Davis is a 53 year old male with h/o DM type 2, CAD, OSA, chronic pain bilateral knees, CVA '11, PFO-on chronic coumadin who was admitted to OSH on 06/13/14 with left basal ganglia hemorrhage with intraventricular extension and hydrocephalus requiring right crani for evacuation as well as VP shunt placement Hospital course complicated by acute respiratory failure requiring tracheostomy as well as PEG placement, BLE DVT requiring IVC placement as well as fevers due to enterobacter PNA. Patient with subsequent right hemiparesis, lethargy, nonverbal, requiring noxious stimuli for arousal, difficulty tracking, dysconjugate gaze as well as difficulty following commands  8/22-Pt endorsed decreased appetite, however was unable to determine when decreased PO intake began -Is currently receiving feeding assistance, RD viewed breakfast tray with <25% completion. Pt had only consumed <50% of MagicCup protein supplement -Weight appears to have maintained stable per previous medical records, fluctuates between 270-278 lbs in past 3 months (2.8% body weight, non-severe for time frame) -Receiving Vital 1.5 at nocturnal feeds of 6pm-6am at 65 ml/hr w/Pro-stat TID. MD requesting  advancing to bolus feeds until PO intake improves -Modified regimen to Vital 1.5 at 250 ml TID between meals  8/24-Dietitian was notified for initiation of full bolus tube feedings. Spoke with RN, pt has been tolerating the bolus tube feeding. Spoke with pt, pt reports having stomach pains, which had started yesterday.  -Pt was receiving bolus tube feeding:Vital 1.5 @ 250 ml TID with 30 ml Prostat via PEG TID. Tube feeding regimen provides 1425 kcal (70% of needs), 96 grams of protein (96% est protein needs), and 573 ml of H2O.  -Increased orders to Vital 1.5 to 450 ml TID and provide 30 ml Prostat per PEG once daily to provide 2125 kcal, 106 grams of protein, and 1026 ml of H2O.    8/25- Spoke with RN, she reports pt has been tolerating the bolus tube feeding well. Pt did have two large, loose bowel movements last night, however had not had one today. Spoke with pt, pt reports he continues to have abdominal pains. Pains however were not serious. Spoke with PA about current TF regimen, discussed on continuing with current orders.  8/28- Pt reports he no longer has abdominal pains. Per RN, pt has been tolerating his tube feeding well.  9/4- Spoke with RN, pt was been tolerating his tube feeding well. She reports however that he has been having loose stools once a day. Pt with no stomach pains.Pt is receiving bolus tube feedings of Vital 1.5 formula at 450 ml TID with 30 ml Prostat once daily per PEG providing 2125 kcal, 106 grams of protein, and 1026 ml of H2O.   9/11-Calorie count- Day 1-Total intake:  1484 kcal (74% of minimum estimated needs)  71 protein (71% of minimum estimated needs) Day 2-Total intake:  1941  kcal (97% of minimum estimated needs)  79 protein (79% of minimum estimated needs) Pt on dysphagia 1 with thin liquids. Bolus TF was discontinued.  9/16- Spoke with pt, pt reports having a good appetite, however meal completion over the past 2 days have been 0-40%. Prior to the past 2  days, pt's meal completion has been 100%. Pt was encouraged to eat his food at meals. Pt expressed understanding. Pt does report that he has been drinking/eating his oral supplements.  9/21- Meal completion has varied from 25-100%. Spoke with pt, he reports his appetite has been decreased a bit. Pt was encouraged to eat his food at meals and to drink his Ensure. Spoke with RN, she reports he has not been drinking his Ensure. RN reports if po intake is poor will encourage po intake or Ensure intake.  Height: Ht Readings from Last 1 Encounters:  07/30/14 _0  (1.803 m)   Weight: Wt Readings from Last 1 Encounters:  08/25/14 258 lb 11.2 oz (117.346 kg)   BMI:  Body mass index is 36.1 kg/(m^2). Class II obesity  Re-Estimated Nutritional Needs: Kcal: 2000-2200 Protein: 100-110 gram Fluid: >/=2000 ml/daily  Skin:+ 1 gen edema  Diet Order: Dysphagia 1 with thin liquids   Intake/Output Summary (Last 24 hours) at 08/30/14 1633 Last data filed at 08/30/14 1230  Gross per 24 hour  Intake    540 ml  Output      0 ml  Net    540 ml    Last BM: 9/20  Labs:  No results found for this basename: NA, K, CL, CO2, BUN, CREATININE, CALCIUM, MG, PHOS, GLUCOSE,  in the last 168 hours  CBG (last 3)   Recent Labs  08/30/14 0734 08/30/14 1217 08/30/14 1559  GLUCAP 114* 104* 100*    Scheduled Meds: . amLODipine  5 mg Oral Daily  . antiseptic oral rinse  7 mL Mouth Rinse q12n4p  . atorvastatin  20 mg Oral q1800  . chlorhexidine  15 mL Mouth Rinse BID  . diclofenac sodium  2 g Topical QID  . enoxaparin (LOVENOX) injection  40 mg Subcutaneous Q24H  . famotidine  20 mg Oral Daily  . feeding supplement (ENSURE COMPLETE)  237 mL Oral BID  . ferrous sulfate  325 mg Oral BID WC  . free water  50 mL Per Tube q12n4p  . hydrochlorothiazide  25 mg Oral Daily  . insulin aspart  0-5 Units Subcutaneous QHS  . insulin aspart  0-9 Units Subcutaneous TID WC  . insulin detemir  20 Units  Subcutaneous BID  . methylphenidate  5 mg Per Tube BID  . metoprolol tartrate  75 mg Oral BID  . saccharomyces boulardii  250 mg Oral BID    Continuous Infusions:   Past Medical History  Diagnosis Date  . GERD (gastroesophageal reflux disease)   . HLD (hyperlipidemia)   . HTN (hypertension)   . Hematuria 8/12  . PFO (patent foramen ovale)     echo 8/12: EF 55-65%, grade 2 diast dysfnx; +PFO on bubble study;  TEE 4/13: EF normal, atrial septum with suspicion for interatrial septum fenestrations without flow across and few large bubbles noted in LA.  This was not felt to require closure   . CAD (coronary artery disease)     a. s/p promus DES circumflex artery 10/13/10. b. Cath 04/2013: stable disease but possible small vessel disease -Imdur added.  Marland Kitchen DVT (deep venous thrombosis) 2013    "right"   .  TIA (transient ischemic attack)     "1-2; both after the one in 02/2010" (09/25/2013)  . OSA on CPAP   . Type II diabetes mellitus     June 2014 AIC 6.2%/notes 09/24/2013; pt denies this hx on 09/25/2013  . Arthritis     "knees" (09/25/2013)  . Rheumatoid arthritis(714.0)   . CVA (cerebral vascular accident) 02/2010    "lost peripheral vision when I had the stroke; some memory issues since" (09/25/2013)    Past Surgical History  Procedure Laterality Date  . Knee arthroscopy Bilateral 2000's    2 on left and 3 on rt  . Tee without cardioversion  03/21/2012    Procedure: TRANSESOPHAGEAL ECHOCARDIOGRAM (TEE);  Surgeon: Fay Records, MD;  Location: [facility] ENDOSCOPY;  Service: Cardiovascular;  Laterality: N/A;  . Coronary angioplasty with stent placement  2012    "1" (09/24/2013)   Kallie Locks, MS, Provisional LDN Pager # [phone] After hours/ weekend pager # [phone]

## 2014-08-30 NOTE — Progress Notes (Signed)
Physical Therapy Session Note  Patient Details  Name: Keyshun J Trupiano MRN: [id number] Date of Birth: 11/20/1961  Today's Date: 08/30/2014 PT Individual Time: 11:00 - 11:50 and 1335-1400 PT Individual Time Calculation (min): 50 min and 25 min   Short Term Goals: Week 4:  PT Short Term Goal 1 (Week 4): Pt to perform bed mobility with min Ax1person, 50% of time PT Short Term Goal 1 - Progress (Week 4): Met PT Short Term Goal 2 (Week 4): Pt to perform bed<>w/c transfers with min Ax1person, 50% of time PT Short Term Goal 2 - Progress (Week 4): Met PT Short Term Goal 3 (Week 4): Pt to propel w/c 100' with min Ax1person, 50% of time PT Short Term Goal 3 - Progress (Week 4): Met PT Short Term Goal 4 (Week 4): Pt to ambulate 150' with min Ax1 person, 50% of time PT Short Term Goal 4 - Progress (Week 4): Met PT Short Term Goal 5 (Week 4): Pt to negotiate up/down 5 steps w/ B rail and min Ax1person, 50% of time PT Short Term Goal 5 - Progress (Week 4): Met  Skilled Therapeutic Interventions/Progress Updates:  Session 1: Patient sitting in wheelchair upon entering room. Session focused on initiation, selective attention, standing, and gait. Patient pushed in wheelchair to gym. Patient performed exercise using rebounder and soccer ball x 10 reps followed by 53 reps showing great improvement in sustained attention to activity. PT attempted to have patient ambulate up and down stairs. Patient stood with max assist and proceeded to refuse to do any suggested activity demonstrating agitation. Patient would not ambulate up steps, would not ambulate with walker, and would not sit down. Patient stood for 15+ minutes while therapist attempted to engage patient in a mobility task. Eventually took +2 therapists to assist patient to sitting in wheelchair. Patient returned to room and left sitting in wheelchair with quick release belt and items in reach. NT alerted to patients difficulty following direction.  Session  2: Patient sitting in wheelchair upon entering room. Session focused on selective attention to task. Patient propelled wheelchair in minimally distractive hallway with moderate cueing and min assist to avoid objects on right. Patient extremely slow and required cueing to attend to activity. Patient left in wheelchair with quick release belt in place and items in reach.   Therapy Documentation Precautions:  Precautions Precautions: Fall Precaution Comments: PEG/abdominal precautions, Bilateral thigh high TED hose  Restrictions Weight Bearing Restrictions: No  Pain: Pain Assessment Pain Assessment: No/denies pain  Locomotion : Wheelchair Mobility Distance: 100   See FIM for current functional status  Therapy/Group: Individual Therapy  ,  M 08/30/2014, 3:44 PM  

## 2014-08-31 ENCOUNTER — Inpatient Hospital Stay (HOSPITAL_COMMUNITY): Payer: Medicare Other

## 2014-08-31 ENCOUNTER — Inpatient Hospital Stay (HOSPITAL_COMMUNITY): Payer: Medicare Other | Admitting: Speech Pathology

## 2014-08-31 ENCOUNTER — Encounter (HOSPITAL_COMMUNITY): Payer: Medicare Other

## 2014-08-31 LAB — GLUCOSE, CAPILLARY
GLUCOSE-CAPILLARY: 99 mg/dL (ref 70–99)
Glucose-Capillary: 136 mg/dL — ABNORMAL HIGH (ref 70–99)
Glucose-Capillary: 83 mg/dL (ref 70–99)
Glucose-Capillary: 85 mg/dL (ref 70–99)
Glucose-Capillary: 115 mg/dL — ABNORMAL HIGH (ref 70–99)

## 2014-08-31 NOTE — Progress Notes (Signed)
Speech Language Pathology Daily Session Note  Patient Details  Name: Tony Davis MRN: [id number] Date of Birth: Sep 11, 1961  Today's Date: 08/31/2014 SLP Individual Time: 1400-1500 SLP Individual Time Calculation (min): 60 min  Short Term Goals: Week 5: SLP Short Term Goal 1 (Week 5): Patient will utilize swallowing compensatory strategies with current diet to minimize overt s/s of aspiration with supervision multimodal cues.  SLP Short Term Goal 2 (Week 5): Patient will initiate functional tasks with Min A multimodal cues.  SLP Short Term Goal 3 (Week 5): Patient will initiate verbal response to yes/no questions with Min multimodal cues. SLP Short Term Goal 4 (Week 5): Patient will follow basic, 1-step directions during self-care tasks with supervision multimodal cues. SLP Short Term Goal 5 (Week 5): Patient will demonstrate use of call bell with Max multimodal cues. SLP Short Term Goal 6 (Week 5): Patient will sustain attention to basic self-care task for ~5 minutes with Mod multimodal cues.   Skilled Therapeutic Interventions:  Pt was seen for skilled speech therapy targeting cognitive-linguistic goals.  Upon arrival, pt was asleep in bed but easily awakened to voice and light touch.  Pt transferred to wheelchair to facilitate improved alertness during cognitive tasks.  SLP facilitated the session with a structured visuospatial/constructional task targeting sustained attention, initiation, and basic problem solving.  Pt sustained attention to the abovementioned task for periods of up to 3 minutes in a moderately distracting environment with overall max assist multimodal cuing.   Pt also required max faded to mod assist multimodal cuing for initiation and basic problem solving to complete task for ~50-75% accuracy.  Once returned to room at the end of the treatment session, SLP reoriented pt to place and situation with the use of written aid present in pt's room.  Pt transferred back to bed  with nurse tech present.  Continue per current plan of care.    FIM:  Comprehension Comprehension Mode: Auditory Comprehension: 3-Understands basic 50 - 74% of the time/requires cueing 25 - 50%  of the time Expression Expression Mode: Verbal Expression: 2-Expresses basic 25 - 49% of the time/requires cueing 50 - 75% of the time. Uses single words/gestures. Social Interaction Social Interaction: 2-Interacts appropriately 25 - 49% of time - Needs frequent redirection. Problem Solving Problem Solving: 2-Solves basic 25 - 49% of the time - needs direction more than half the time to initiate, plan or complete simple activities Memory Memory: 1-Recognizes or recalls less than 25% of the time/requires cueing greater than 75% of the time FIM - Eating Eating Activity: 5: Needs verbal cues/supervision  Pain Pain Assessment Pain Assessment: No/denies pain  Therapy/Group: Individual Therapy  Windell Moulding, M.A. CCC-SLP  Destiny Trickey, Selinda Orion 08/31/2014, 4:29 PM

## 2014-08-31 NOTE — Progress Notes (Signed)
Physical Therapy Session Note  Patient Details  Name: Tony Davis MRN: [id number] Date of Birth: 1961/07/26  Today's Date: 08/31/2014 PT Individual Time:  -      Short Term Goals: Week 5:  PT Short Term Goal 1 (Week 5): STGs-LTGs due to anticipated LOS  Skilled Therapeutic Interventions/Progress Updates:  1:1. Pt received sitting in w/c, ready for therapy. Focus this session on cognitive remediation, functional wheelchair propulsion, functional ambulation, balance and therex. Pt req max cues for orientation, able to state that he was in the hospital, noted increased processing as therapist educating pt on time and situation. Overall, pt req min cues for initiation throughout session and mod cues for selective attention to functional tasks in busy hallways and therapy gym. Pt req min guard-min A for all t/f sit<>stand, but consistently attempts to sit prematurely. Pt able to propel w/c 165' w/ B UE, demonstrating slow but steady pace due to distractibility but increased pace when redirected to task. Pt with good tolerance to amb 300'x1 w/ use of shopping cart to facilitate more fluid pattern with increased step lengths. Pt req consistent cueing for proximity to shopping cart and erect posture. Pt played horseshoe toss to target balance, req Min guard-mod A while reaching to R side at edge of BOS. Pt consistently losing LOB in posterior directions when distracted, req assist to sit safely. Pt utilized NuStep to target B UE/LE strength and endurance, level 4x2min w/ good tolerance, min cues to maintain pace. Pt left sitting in w/c at end of session w/ all needs in reach, quick release belt in place.   Therapy Documentation Precautions:  Precautions Precautions: Fall Precaution Comments: PEG/abdominal precautions, Bilateral thigh high TED hose  Restrictions Weight Bearing Restrictions: No Pain: Pain Assessment Pain Assessment: No/denies pain  See FIM for current functional  status  Therapy/Group: Individual Therapy  Gilmore Laroche 08/31/2014, 8:44 AM

## 2014-08-31 NOTE — Progress Notes (Signed)
Subjective/Complaints: Had a reasonable night. No complaints. Pain reasonable Review of Systems -remains limited due to cognitive status   Objective: Vital Signs: Blood pressure 142/84, pulse 76, temperature 98.5 F (36.9 C), temperature source Oral, resp. rate 19, weight 117.346 kg (258 lb 11.2 oz), SpO2 100.00%. No results found. Results for orders placed during the hospital encounter of 07/30/14 (from the past 72 hour(s))  GLUCOSE, CAPILLARY     Status: Abnormal   Collection Time    08/28/14 11:22 AM      Result Value Ref Range   Glucose-Capillary 109 (*) 70 - 99 mg/dL  GLUCOSE, CAPILLARY     Status: Abnormal   Collection Time    08/28/14  4:36 PM      Result Value Ref Range   Glucose-Capillary 118 (*) 70 - 99 mg/dL  GLUCOSE, CAPILLARY     Status: Abnormal   Collection Time    08/28/14  8:50 PM      Result Value Ref Range   Glucose-Capillary 145 (*) 70 - 99 mg/dL   Comment 1 Notify RN    GLUCOSE, CAPILLARY     Status: Abnormal   Collection Time    08/29/14  6:41 AM      Result Value Ref Range   Glucose-Capillary 111 (*) 70 - 99 mg/dL   Comment 1 Notify RN    GLUCOSE, CAPILLARY     Status: Abnormal   Collection Time    08/29/14 12:01 PM      Result Value Ref Range   Glucose-Capillary 111 (*) 70 - 99 mg/dL  GLUCOSE, CAPILLARY     Status: None   Collection Time    08/29/14  4:41 PM      Result Value Ref Range   Glucose-Capillary 88  70 - 99 mg/dL  GLUCOSE, CAPILLARY     Status: Abnormal   Collection Time    08/29/14  8:46 PM      Result Value Ref Range   Glucose-Capillary 143 (*) 70 - 99 mg/dL   Comment 1 Notify RN    GLUCOSE, CAPILLARY     Status: Abnormal   Collection Time    08/30/14  7:34 AM      Result Value Ref Range   Glucose-Capillary 114 (*) 70 - 99 mg/dL  GLUCOSE, CAPILLARY     Status: Abnormal   Collection Time    08/30/14 12:17 PM      Result Value Ref Range   Glucose-Capillary 104 (*) 70 - 99 mg/dL  GLUCOSE, CAPILLARY     Status: Abnormal   Collection Time    08/30/14  3:59 PM      Result Value Ref Range   Glucose-Capillary 100 (*) 70 - 99 mg/dL  GLUCOSE, CAPILLARY     Status: None   Collection Time    08/30/14  8:55 PM      Result Value Ref Range   Glucose-Capillary 92  70 - 99 mg/dL  GLUCOSE, CAPILLARY     Status: Abnormal   Collection Time    08/31/14  7:56 AM      Result Value Ref Range   Glucose-Capillary 136 (*) 70 - 99 mg/dL     HEENT: neck clean. Trach site closed Cardio: RRR and no murmur Resp: CTA B/L and unlabored, clear.  GI: BS positive and PEG site clean. Extremity:  Bilateral pedal edema trace to 1+  Skin:   Intact other than above Neuro:   Initiates more. Better attention/awareness.  Abnormal Sensory reduced on  left difficult to assess secondary to MS, Abnormal Motor 4 LUE, 3 to 3+ RLE, 3- LLE, 5/5 RUE grossly  Musc/Skel:  Normal GEN NAD   Assessment/Plan: 1. Functional deficits secondary to left Thalamic ICH, Hydrocephalus with Right  shunt which require 3+ hours per day of interdisciplinary therapy in a comprehensive inpatient rehab setting. Physiatrist is providing close team supervision and 24 hour management of active medical problems listed below. Physiatrist and rehab team continue to assess barriers to discharge/monitor patient progress toward functional and medical goals.  FIM: FIM - Bathing Bathing Steps Patient Completed: Chest;Right Arm;Left Arm;Abdomen;Front perineal area;Right upper leg;Left upper leg;Right lower leg (including foot);Left lower leg (including foot) Bathing: 4: Min-Patient completes 8-9 48f 10 parts or 75+ percent  FIM - Upper Body Dressing/Undressing Upper body dressing/undressing steps patient completed: Thread/unthread right sleeve of pullover shirt/dresss;Thread/unthread left sleeve of pullover shirt/dress;Put head through opening of pull over shirt/dress;Pull shirt over trunk Upper body dressing/undressing: 5: Supervision: Safety issues/verbal cues FIM - Lower  Body Dressing/Undressing Lower body dressing/undressing steps patient completed: Thread/unthread right underwear leg;Thread/unthread left underwear leg;Pull underwear up/down;Thread/unthread right pants leg;Thread/unthread left pants leg;Pull pants up/down Lower body dressing/undressing: 3: Mod-Patient completed 50-74% of tasks  FIM - Toileting Toileting steps completed by patient: Adjust clothing prior to toileting Toileting Assistive Devices: Grab bar or rail for support Toileting: 1: Total-Patient completed zero steps, helper did all 3  FIM - Radio producer Devices: Bedside commode Toilet Transfers: 3-To toilet/BSC: Mod A (lift or lower assist);3-From toilet/BSC: Mod A (lift or lower assist)  FIM - Bed/Chair Transfer Bed/Chair Transfer Assistive Devices: Arm rests;Walker Bed/Chair Transfer: 4: Bed > Chair or W/C: Min A (steadying Pt. > 75%);4: Chair or W/C > Bed: Min A (steadying Pt. > 75%)  FIM - Locomotion: Wheelchair Distance: 100 Locomotion: Wheelchair: 2: Travels 50 - 149 ft with minimal assistance (Pt.>75%) FIM - Locomotion: Ambulation Locomotion: Ambulation Assistive Devices: Administrator Ambulation/Gait Assistance: 4: Min guard;5: Supervision Locomotion: Ambulation: 0: Activity did not occur  Comprehension Comprehension Mode: Auditory Comprehension: 3-Understands basic 50 - 74% of the time/requires cueing 25 - 50%  of the time  Expression Expression Mode: Verbal Expression Assistive Devices: 6-Talk trach valve Expression: 2-Expresses basic 25 - 49% of the time/requires cueing 50 - 75% of the time. Uses single words/gestures.  Social Interaction Social Interaction Mode: Asleep Social Interaction: 2-Interacts appropriately 25 - 49% of time - Needs frequent redirection.  Problem Solving Problem Solving Mode: Asleep Problem Solving: 1-Solves basic less than 25% of the time - needs direction nearly all the time or does not effectively  solve problems and may need a restraint for safety  Memory Memory Mode: Asleep Memory: 1-Recognizes or recalls less than 25% of the time/requires cueing greater than 75% of the time  Medical Problem List and Plan:  1. Functional deficits secondary to Left thalamic bleed with hydrocephalus.  2. DVT Prophylaxis/Anticoagulation/has IVCF: Pharmaceutical: Lovenox-  -TEDs for edema, elevation  -hx of dvt  3. Chronic B-knee/Pain Management: Prn oxycodone.   Voltaren gel for knees.  -poorly-compliant pain patient in my practice 4. Mood: Monitor as mentation improves.  5. Neuropsych: This patient is not capable of making decisions on his own behalf.   -continue   ritalin-- 6. Skin/Wound Care:   Continue air mattress overlay.   7. DM type 2: Will monitor BS with ac/hs checks. Continue Levemir bid    -reasonable control still  8. Enterobacter tracheobronchitis: resolved 7. HTN/CAD:  -metoprolol bid  -continue  HCTZ  -increased norvasc to 10mg  8. ABLA: on iron supplement.   9. Dysphagia: on d1 thins, still eating well  -off TF  -advance diet per SLP 10. Bladder-timed toilet   LOS (Days) 32 A FACE TO FACE EVALUATION WAS PERFORMED  Agnes Brightbill T 08/31/2014, 8:03 AM

## 2014-08-31 NOTE — Progress Notes (Signed)
Occupational Therapy Session Note  Patient Details  Name: Tony Davis MRN: [id number] Date of Birth: 1961-06-20  Today's Date: 08/31/2014 OT Individual Time: 0700-0800 OT Individual Time Calculation (min): 60 min    Short Term Goals: Week 4:  OT Short Term Goal 1 (Week 4): Pt will perform tub/shower transfer with min A OT Short Term Goal 2 (Week 4): Pt will perform bathing tasks with min A OT Short Term Goal 3 (Week 4): Pt will perform toilet transfers with min A  Skilled Therapeutic Interventions/Progress Updates:    Pt engaged in BADL retraining including bathing at shower level and dressing with sit<>stand from w/c.  Pt required extra time to initiate supine->sit EOB in addition to tactile cue.  Pt required min A for stand pivot transfer to w/c.  Pt required min verbal cues to initiate bathing tasks and for redirection to task.  Pt frequently stops performing task and requires verbal cue to reinitiate.  Pt distracted by noises in hallway.  Pt performed stand pivot transfer from tub bench to w/c with supervision.  Focus on task initiation, sequencing, attention to task, sit<>stand, transfers, cognitive remediation, activity tolerance, and safety awareness.  Therapy Documentation Precautions:  Precautions Precautions: Fall Precaution Comments: PEG/abdominal precautions, Bilateral thigh high TED hose  Restrictions Weight Bearing Restrictions: No Pain: Pain Assessment Pain Assessment: No/denies pain  See FIM for current functional status  Therapy/Group: Individual Therapy  Leroy Libman 08/31/2014, 9:43 AM

## 2014-09-01 ENCOUNTER — Inpatient Hospital Stay (HOSPITAL_COMMUNITY): Payer: Medicare Other

## 2014-09-01 ENCOUNTER — Encounter (HOSPITAL_COMMUNITY): Payer: Medicare Other

## 2014-09-01 ENCOUNTER — Inpatient Hospital Stay (HOSPITAL_COMMUNITY): Payer: Medicare Other | Admitting: Speech Pathology

## 2014-09-01 LAB — GLUCOSE, CAPILLARY
GLUCOSE-CAPILLARY: 73 mg/dL (ref 70–99)
Glucose-Capillary: 77 mg/dL (ref 70–99)
Glucose-Capillary: 92 mg/dL (ref 70–99)
Glucose-Capillary: 118 mg/dL — ABNORMAL HIGH (ref 70–99)

## 2014-09-01 NOTE — Progress Notes (Signed)
Physical Therapy Session Note  Patient Details  Name: ZABDIEL DRIPPS MRN: [id number] Date of Birth: 1960/12/11  Today's Date: 09/01/2014 PT Individual Time: 0800-0900 PT Individual Time Calculation (min): 60 min   Short Term Goals: Week 5:  PT Short Term Goal 1 (Week 5): STGs-LTGs due to anticipated LOS  Skilled Therapeutic Interventions/Progress Updates:  1:1. Pt received sitting in w/c, ready for therapy. Focus this session on cognitive remediation, functional endurance, balance and therex. Pt req total cues to remember OT session directly prior as well as orientation to place, time and situation. Pt with no carryover throughout the session. Overall, min multimodal cues for initiation of functional tasks this session and mod cues for selective attention to functional tasks. Pt with excellent tolerance to ambulation 180'x1 and 200'x1 w/ RW and close(S) as well as negotiation up/down 5 steps sideways w/ use of single rail and min A. Pt continues to benefit from tactile cues for increased pace due to distractibility. Pt req min A for completion of obstacle negotiation challenge req pt to step over various low thresholds and maneuver around cones. Pt req multimodal cues for management of RW. Pt initially req mod cues, decreasing to min cues to maintain selective attention to 3 rounds of connect four in mildly busy gym. With repetition, pt demonstrating improved problem solving and mental flexibility. Pt utilized NuStep to target B UE/LE strength and endurance, level 4x7 and level 5x43min with excellent tolerance, mod cues to maintain pace. Pt grimacing and rubbing R knee at end of session, RN made aware. Pt left sitting in w/c at end of session w/ all needs in reach and quick release belt in place.    Therapy Documentation Precautions:  Precautions Precautions: Fall Precaution Comments: PEG/abdominal precautions, Bilateral thigh high TED hose  Restrictions Weight Bearing Restrictions:  No Pain: Pain Assessment Pain Assessment: No/denies pain Faces Pain Scale: Hurts even more Pain Type: Acute pain Pain Location: Abdomen Pain Orientation: Right Pain Descriptors / Indicators: Aching Pain Frequency: Intermittent Pain Onset: Gradual Patients Stated Pain Goal: 2 Pain Intervention(s): Medication (See eMAR)  See FIM for current functional status  Therapy/Group: Individual Therapy  Gilmore Laroche 09/01/2014, 11:29 AM

## 2014-09-01 NOTE — Progress Notes (Signed)
Note/chart reviewed. Agree with note.   Javaris Wigington RD, LDN, CNSC [phone] Pager [phone] After Hours Pager   

## 2014-09-01 NOTE — Progress Notes (Signed)
Social Work Patient ID: Tony Davis, male   DOB: [birth year], 53 y.o.   MRN: [id number]  Lennart Pall, LCSW Social Worker Signed  Patient Care Conference Service date: 09/01/2014 7:49 AM  Inpatient RehabilitationTeam Conference and Plan of Care Update Date: 08/31/2014   Time: 2:50 PM     Patient Name: Tony Davis       Medical Record Number: [id number]   Date of Birth: [birth year] Sex: Male         Room/Bed: 4W05C/4W05C-01 Payor Info: Payor: MEDICARE / Plan: MEDICARE PART A AND B / Product Type: *No Product type* /   Admitting Diagnosis: deconditioning   Admit Date/Time:  07/30/2014  8:40 PM Admission Comments: No comment available   Primary Diagnosis:  <principal problem not specified> Principal Problem: <principal problem not specified>    Patient Active Problem List     Diagnosis  Date Noted   .  Basal ganglia hemorrhage  07/30/2014   .  Syncope due to orthostatic hypotension  04/15/2014   .  Medication adverse effect  04/15/2014   .  Syncope  04/15/2014   .  Routine general medical examination at a health care facility  02/11/2014   .  BPH associated with nocturia  02/11/2014   .  Gynecomastia  02/11/2014   .  Osteoarthritis of left knee  08/21/2013   .  Osteoarthritis of right knee  08/21/2013   .  Insomnia, persistent  03/25/2013   .  History of tobacco use  03/25/2013   .  Chronic rhinitis  03/25/2013   .  Hemiparesis due to old cerebral infarction  03/20/2013   .  DJD (degenerative joint disease) of knee  10/13/2012   .  Folate deficiency anemia  10/13/2012   .  Long term (current) use of anticoagulants  03/28/2012   .  DVT (deep venous thrombosis)  03/19/2012   .  Obstructive sleep apnea  08/16/2011   .  Type II or unspecified type diabetes mellitus without mention of complication, uncontrolled  08/06/2011   .  PATENT FORAMEN OVALE  03/13/2010   .  CVA  02/08/2010   .  CAD, NATIVE VESSEL  01/26/2010   .  Hyperlipidemia LDL goal < 100  01/23/2010   .  Essential  hypertension, benign  01/23/2010   .  GERD  01/23/2010     Expected Discharge Date: Expected Discharge Date:  (SNF)  Team Members Present: Physician leading conference: Dr. Alger Simons Social Worker Present: Lennart Pall, LCSW Nurse Present: Elliot Cousin, RN PT Present: Melene Plan, Cottie Banda, PT OT Present: Roanna Epley, [place], OT SLP Present: Weston Anna, SLP PPS Coordinator present : Daiva Nakayama, RN, CRRN        Current Status/Progress  Goal  Weekly Team Focus   Medical     gradual progress. still incontinent  contineud bowel and bladder mgt  skin, bowels, bladder   Bowel/Bladder     Patient incont blaader, cont bowel. Condom cath at [facility], does not alert staff   Mangage bowel and bladder with min assist  Offer patient urinal    Swallow/Nutrition/ Hydration     Dys. 1 textures with thin liquids, Mod A for use of strategies  Mod A  trials of Dys. 2 textures, increased use of swallowing compensatory strategies    ADL's     UB dressing-min A/supervision; LB dressing-mod A; functional transfers-min A/mod A; extra time to initiate with max verbal cues  mod A overall, min A  UB dressing  attention, orientation, transfers, sitting balance, initiation   Mobility     Min-mod A for transfers; Min-mod A for ambulation, w/c propulsion, stair negotiation; mod-max A for attention, initiation and awareness  Supervision for sitting balance, bed mobility; Min A for standing balance, ambulation, w/c propulsion, stair negotiation; Mod A for cognition  activity tolerance, bed mobility, t/fs, balance, safety during funcitonal mobility, attention, initiation, orientation   Communication     Max A  Mod A  initiation of verbal responses    Safety/Cognition/ Behavioral Observations    max assist   mod assist  Continue to educate about calling for assistance   Pain     denies pain, voltaren to knees   less than 3 out of 10  assess pain and medicate as needed   Skin     Skin  intact, patient on air mattress  No new skin breakdown  assess skin q shift     *See Care Plan and progress notes for long and short-term goals.    Barriers to Discharge:  continue neuro issues which will be longer term      Possible Resolutions to Barriers:    supervision SNF      Discharge Planning/Teaching Needs:    Plan has changed to SNF as family cannot meet 24/7 care needs      Team Discussion:    Currently supervision to min assist overall but initiation continues to be a barrier.  Some improved attention.  SW reports plan has changed to SNF as wife cannot meet care needs.  Keeping pt on D1, thin as not seeming to tolerate upgrade.   Revisions to Treatment Plan:    D/c plan changed to SNF    Continued Need for Acute Rehabilitation Level of Care: The patient requires daily medical management by a physician with specialized training in physical medicine and rehabilitation for the following conditions: Daily direction of a multidisciplinary physical rehabilitation program to ensure safe treatment while eliciting the highest outcome that is of practical value to the patient.: Yes Daily medical management of patient stability for increased activity during participation in an intensive rehabilitation regime.: Yes Daily analysis of laboratory values and/or radiology reports with any subsequent need for medication adjustment of medical intervention for : Post surgical problems;Neurological problems  Caleah Tortorelli 09/01/2014, 7:49 AM         Lennart Pall, LCSW Social Worker Signed  Patient Care Conference Service date: 08/25/2014 2:28 PM  Inpatient RehabilitationTeam Conference and Plan of Care Update Date: 08/24/2014   Time: 2:55 PM     Patient Name: Tony Davis       Medical Record Number: [id number]   Date of Birth: 1961-08-24 Sex: Male         Room/Bed: 4W05C/4W05C-01 Payor Info: Payor: MEDICARE / Plan: MEDICARE PART A AND B / Product Type: *No Product type* /   Admitting  Diagnosis: DECOND  S P L BG   Admit Date/Time:  07/30/2014  8:40 PM Admission Comments: No comment available   Primary Diagnosis:  <principal problem not specified> Principal Problem: <principal problem not specified>    Patient Active Problem List     Diagnosis  Date Noted   .  Basal ganglia hemorrhage  07/30/2014   .  Syncope due to orthostatic hypotension  04/15/2014   .  Medication adverse effect  04/15/2014   .  Syncope  04/15/2014   .  Routine general medical examination at a health care facility  02/11/2014   .  BPH associated with nocturia  02/11/2014   .  Gynecomastia  02/11/2014   .  Osteoarthritis of left knee  08/21/2013   .  Osteoarthritis of right knee  08/21/2013   .  Insomnia, persistent  03/25/2013   .  History of tobacco use  03/25/2013   .  Chronic rhinitis  03/25/2013   .  Hemiparesis due to old cerebral infarction  03/20/2013   .  DJD (degenerative joint disease) of knee  10/13/2012   .  Folate deficiency anemia  10/13/2012   .  Long term (current) use of anticoagulants  03/28/2012   .  DVT (deep venous thrombosis)  03/19/2012   .  Obstructive sleep apnea  08/16/2011   .  Type II or unspecified type diabetes mellitus without mention of complication, uncontrolled  08/06/2011   .  PATENT FORAMEN OVALE  03/13/2010   .  CVA  02/08/2010   .  CAD, NATIVE VESSEL  01/26/2010   .  Hyperlipidemia LDL goal < 100  01/23/2010   .  Essential hypertension, benign  01/23/2010   .  GERD  01/23/2010     Expected Discharge Date: Expected Discharge Date: 09/02/14  Team Members Present: Physician leading conference: Dr. Alger Simons Social Worker Present: Ovidio Kin, LCSW Nurse Present: Elliot Cousin, RN PT Present: Melene Plan, Cottie Banda, PT OT Present: Roanna Epley, COTA;Kayla Perkinson, OT;Jennifer Tamala Julian, OT SLP Present: Weston Anna, SLP PPS Coordinator present : Daiva Nakayama, RN, CRRN        Current Status/Progress  Goal  Weekly Team Focus   Medical      improving cognition, swallow advancing. motor performance better.   improve continence.   timed voids, improve po intake   Bowel/Bladder     pt incontinent of bowel and bladder. Condom cath used HS. No initiation with staff to void or BMs  Manage bowel and bladder with max assist.  offer timed tolieting q2hrs.    Swallow/Nutrition/ Hydration     Dys. 1 textures with thin liquids, trials of Dys. 2 textures with thin liquids, Max A  Mod A  increase utilization of swallowing strategies, tolerance of upgraded textures   ADL's     UB dressing-min/mod A/ LN dressing mod A, mod A for transfers; extra time to initiate with max verbal cues  mod A overall, min A UB dressing  attention, orientation, transfers, sitting balance, bed mobility   Mobility     Mod-max A for transfers; Min-mod A for ambulation, w/c propulsion, stair negotiation; mod-max A for attention, initiation, awareness   Supervision for sitting balance, bed mobility; Min A for standing balance, ambulation, w/c propulsion, stair negotiation; Mod A for cognition  activity tolerance, bed mobility, t/fs, balance, safety during functional mobility, attention, initiaiton, orientation    Communication     Max A  Mod A  initiation of verbal responses   Safety/Cognition/ Behavioral Observations    Max A  mod assist  attention, initiation, problem solving, orientation   Pain     no complaints of pain to nursing staff. Volteran gel to BL knees   < 3 out or 10  assess pain qshift. medicate as needed   Skin     pt on mattress overlay. Barrier cream used to sacrum.   no new skin breakdown or infection during rehab stay with mod assist.  assess skin q shift. apply epbc prn    Rehab Goals Patient on target to meet rehab goals: Yes *See Care Plan and progress  notes for long and short-term goals.    Barriers to Discharge:  substantial deficits      Possible Resolutions to Barriers:    family ed      Discharge Planning/Teaching Needs:     wife reports she still plans to have pt d/c home with her as primary support the first couple of weeks then her father and neighbor providing assist when she returns to work      Team Discussion:    Cognition continues to improve and initiation better.  Stopping tube feeds and trials of D2 today.  Min/ mod assist overall and still with some incontinence - trying timed toileting.   Revisions to Treatment Plan:    Stop tube feeds    Continued Need for Acute Rehabilitation Level of Care: The patient requires daily medical management by a physician with specialized training in physical medicine and rehabilitation for the following conditions: Daily direction of a multidisciplinary physical rehabilitation program to ensure safe treatment while eliciting the highest outcome that is of practical value to the patient.: Yes Daily medical management of patient stability for increased activity during participation in an intensive rehabilitation regime.: Yes Daily analysis of laboratory values and/or radiology reports with any subsequent need for medication adjustment of medical intervention for : Neurological problems;Post surgical problems  Grier Czerwinski 08/25/2014, 2:29 PM         Lennart Pall, [place] Worker Signed  Patient Care Conference Service date: 08/19/2014 10:48 AM  Inpatient RehabilitationTeam Conference and Plan of Care Update Date: 08/17/2014   Time: 2:50 PM     Patient Name: KAIMEN PEINE       Medical Record Number: [id number]   Date of Birth: 09-15-1961 Sex: Male         Room/Bed: 4W05C/4W05C-01 Payor Info: Payor: MEDICARE / Plan: MEDICARE PART A AND B / Product Type: *No Product type* /   Admitting Diagnosis: DECOND  S P L BG   Admit Date/Time:  07/30/2014  8:40 PM Admission Comments: No comment available   Primary Diagnosis:  <principal problem not specified> Principal Problem: <principal problem not specified>    Patient Active Problem List     Diagnosis  Date Noted   .   Basal ganglia hemorrhage  07/30/2014   .  Syncope due to orthostatic hypotension  04/15/2014   .  Medication adverse effect  04/15/2014   .  Syncope  04/15/2014   .  Routine general medical examination at a health care facility  02/11/2014   .  BPH associated with nocturia  02/11/2014   .  Gynecomastia  02/11/2014   .  Osteoarthritis of left knee  08/21/2013   .  Osteoarthritis of right knee  08/21/2013   .  Insomnia, persistent  03/25/2013   .  History of tobacco use  03/25/2013   .  Chronic rhinitis  03/25/2013   .  Hemiparesis due to old cerebral infarction  03/20/2013   .  DJD (degenerative joint disease) of knee  10/13/2012   .  Folate deficiency anemia  10/13/2012   .  Long term (current) use of anticoagulants  03/28/2012   .  DVT (deep venous thrombosis)  03/19/2012   .  Obstructive sleep apnea  08/16/2011   .  Type II or unspecified type diabetes mellitus without mention of complication, uncontrolled  08/06/2011   .  PATENT FORAMEN OVALE  03/13/2010   .  CVA  02/08/2010   .  CAD, NATIVE VESSEL  01/26/2010   .  Hyperlipidemia LDL goal < 100  01/23/2010   .  Essential hypertension, benign  01/23/2010   .  GERD  01/23/2010     Expected Discharge Date: Expected Discharge Date: 09/02/14  Team Members Present: Physician leading conference: Dr. Alger Simons Social Worker Present: Lennart Pall, LCSW Nurse Present: Elliot Cousin, RN PT Present: Melene Plan, Cottie Banda, PT OT Present: Willeen Cass, Roland Earl, OT SLP Present: Weston Anna, SLP PPS Coordinator present : Daiva Nakayama, RN, CRRN        Current Status/Progress  Goal  Weekly Team Focus   Medical     improved attention, arousal. still slow to initiate  improve attention  pain control, begin diet   Bowel/Bladder     Incontinent b/b, LBM 9/7, condom cath HS, no initiation with staff to void  Manage bowel and bladder with mod assist.  offer toileting q 2h and PRN   Swallow/Nutrition/ Hydration      Dys. 1 textures with thin liquids, Max A  Mod A  increase PO intake, increase utilization of swallowing strategies, trials upgraded textures   ADL's     UB dressing - min A ; LB dressing mod A, mod A with extra time for sit to stands, decr intiation and activity tolerance, +2 for transfers for safety  mod A overall, min A UB dressing  attention, orientation, transfers, sitting balance, bed mobility   Mobility     Mod Ax1person to Ax2 persons overall dependent upon alertness arousal, attention, initiation, ability to follow simple commands; req mod-total cueing overall  Supervision for sitting balance; Min A for cognition and w/c propulsion; Mod A for bed mobility, transfers, balance, ambulation, stair negotiation.   activity tolerance, bed mobility, transfers, balance, standing balance, attention, initiation, orientation   Communication     Max A  Mod A  initiation of verbal responses   Safety/Cognition/ Behavioral Observations    Max-Total A  mod assist  attention, initaition, problem solving, orientation    Pain     denies pain, no evidence of pain       Skin     Air overlay mattress, excoriation to scrotum - applying barrier cream PRN  no new skin breakdown or infection during rehab stay with mod assist.  Assess skin q shirt and PRN, turn q 2 hours    Rehab Goals Patient on target to meet rehab goals: Yes *See Care Plan and progress notes for long and short-term goals.    Barriers to Discharge:  see prior, pain      Possible Resolutions to Barriers:    rx pain, distraction      Discharge Planning/Teaching Needs:    wife still plans to have pt d/c home with her and other family members providing 24/7 assistance      Team Discussion:    Improved attention.   Started on D1 diet with thin but actually completing a meal may be a barrier.  Moderate assist to initiate at times.  Still incont b/b and does not appear to have any awareness.  Timed toileting not successful so far.  On  track for minimal assist w/c  Level.  Good session with wife on Monday, however, she does not verbally offer much opinion about how she feels he is progressing.  SW to follow up.   Revisions to Treatment Plan:    None    Continued Need for Acute Rehabilitation Level of Care: The patient requires daily medical management by a physician with specialized training in  physical medicine and rehabilitation for the following conditions: Daily direction of a multidisciplinary physical rehabilitation program to ensure safe treatment while eliciting the highest outcome that is of practical value to the patient.: Yes Daily medical management of patient stability for increased activity during participation in an intensive rehabilitation regime.: Yes Daily analysis of laboratory values and/or radiology reports with any subsequent need for medication adjustment of medical intervention for : Post surgical problems;Neurological problems  Mafalda Mcginniss 08/19/2014, 10:49 AM         Lennart Pall, LCSW Social Worker Signed  Patient Care Conference Service date: 08/11/2014 6:43 AM  Inpatient RehabilitationTeam Conference and Plan of Care Update Date: 08/10/2014   Time: 2:50 PM     Patient Name: HUCK ASHWORTH       Medical Record Number: [id number]   Date of Birth: 1961-06-03 Sex: Male         Room/Bed: 4W05C/4W05C-01 Payor Info: Payor: MEDICARE / Plan: MEDICARE PART A AND B / Product Type: *No Product type* /   Admitting Diagnosis: DECOND  S P L BG   Admit Date/Time:  07/30/2014  8:40 PM Admission Comments: No comment available   Primary Diagnosis:  <principal problem not specified> Principal Problem: <principal problem not specified>    Patient Active Problem List     Diagnosis  Date Noted   .  Basal ganglia hemorrhage  07/30/2014   .  Syncope due to orthostatic hypotension  04/15/2014   .  Medication adverse effect  04/15/2014   .  Syncope  04/15/2014   .  Routine general medical examination at a health  care facility  02/11/2014   .  BPH associated with nocturia  02/11/2014   .  Gynecomastia  02/11/2014   .  Osteoarthritis of left knee  08/21/2013   .  Osteoarthritis of right knee  08/21/2013   .  Insomnia, persistent  03/25/2013   .  History of tobacco use  03/25/2013   .  Chronic rhinitis  03/25/2013   .  Hemiparesis due to old cerebral infarction  03/20/2013   .  DJD (degenerative joint disease) of knee  10/13/2012   .  Folate deficiency anemia  10/13/2012   .  Long term (current) use of anticoagulants  03/28/2012   .  DVT (deep venous thrombosis)  03/19/2012   .  Obstructive sleep apnea  08/16/2011   .  Type II or unspecified type diabetes mellitus without mention of complication, uncontrolled  08/06/2011   .  PATENT FORAMEN OVALE  03/13/2010   .  CVA  02/08/2010   .  CAD, NATIVE VESSEL  01/26/2010   .  Hyperlipidemia LDL goal < 100  01/23/2010   .  Essential hypertension, benign  01/23/2010   .  GERD  01/23/2010     Expected Discharge Date: Expected Discharge Date: 09/02/14  Team Members Present: Physician leading conference: Dr. Alger Simons Social Worker Present: Lennart Pall, LCSW Nurse Present: Elliot Cousin, RN PT Present: Otis Brace, PT;Bridgett Ripa, PT OT Present: Roanna Epley, [place], OT SLP Present: Gunnar Fusi, SLP PPS Coordinator present : Daiva Nakayama, RN, CRRN        Current Status/Progress  Goal  Weekly Team Focus   Medical     some improvement in arousal, attention. trach out  improve attention  improve activation, attention   Bowel/Bladder     Incontinent of bowel and bladder; LBM 8/31- liquid BMs  Manage bowel and bladder with mod assist.  offer toileting  q2h and prn   Swallow/Nutrition/ Hydration     NPO with peg  Max Assist  Initiated water protocol   ADL's     UB dressing-mod A; bathing and LB dressing-tot A; transfers-tot A + 2; max A for task initiation; decreased activity tolerance; decreased attention  mod A overall, min A  UB dressing  activity tolerance, bed mobility, attention, transfers, sitting balance   Mobility     ModAx1person to Ax2oersons overall for bed mobility, squat pivot transfers, t/f sit<>stand, amb w/ use of Sara +; Mod-max cues for focused attention, initiation, following simple step commands  Supervision for sitting balance; Min A for cognition and w/c propulsion; Mod A for bed mobility, transfers; Max A for standing balance and ambulation  activity tolerance, bd mobility, transfers, balance, standing balance, attention, initiation, orientation   Communication     Max-Total A  Mod A  Initiation of expression of wants/needs, following 1 step commands   Safety/Cognition/ Behavioral Observations    Total A  mod assist  Sustained attention, initiation, basic problem solving   Pain     Denies pain; no evidence of pain  < 3  Assess and treat for pain q shift and prn   Skin     air overlay mattress; excoriation to scrotum with few open areas- applying barrier cream prn  no new skin breakdown or infection during rehab stay with mod assist.  Turn q2h; utilize air overlay mattress; assess skin shift and prn    Rehab Goals Patient on target to meet rehab goals: Yes *See Care Plan and progress notes for long and short-term goals.    Barriers to Discharge:  see prior, chronicity of injury      Possible Resolutions to Barriers:    NMR, cognitive perceptual rx      Discharge Planning/Teaching Needs:    wife still plans to have pt d/c home with her and other family members providing 24/7 assistance   ongoing    Team Discussion:    Lurline Idol out.  Some improved initiation but not consistent.  Knee pain significantly inhibiting standing.  MD feels may need to limit amount of time up due to severe OA of both knees.  Overall goals of mod assist at w/c level. H2O protocol underway.     Revisions to Treatment Plan:    May need to revise standing/ ambulation goals    Continued Need for Acute Rehabilitation  Level of Care: The patient requires daily medical management by a physician with specialized training in physical medicine and rehabilitation for the following conditions: Daily direction of a multidisciplinary physical rehabilitation program to ensure safe treatment while eliciting the highest outcome that is of practical value to the patient.: Yes Daily medical management of patient stability for increased activity during participation in an intensive rehabilitation regime.: Yes Daily analysis of laboratory values and/or radiology reports with any subsequent need for medication adjustment of medical intervention for : Neurological problems;Post surgical problems  Madaleine Simmon 08/11/2014, 6:43 AM

## 2014-09-01 NOTE — Progress Notes (Signed)
Occupational Therapy Session Note  Patient Details  Name: Tony Davis MRN: [id number] Date of Birth: [birth year]  Today's Date: 09/01/2014 OT Individual Time: 0700-0800 OT Individual Time Calculation (min): 60 min    Short Term Goals: Week 4:  OT Short Term Goal 1 (Week 4): Pt will perform tub/shower transfer with min A OT Short Term Goal 2 (Week 4): Pt will perform bathing tasks with min A OT Short Term Goal 3 (Week 4): Pt will perform toilet transfers with min A  Skilled Therapeutic Interventions/Progress Updates:    Pt engaged in BADL retraining including bathing at shower level and dressing with sit<>stand from w/c.  Pt performed supine->sit EOB this morning at supervision level.  Although patient continues to require extra time to initiate tasks, his responses time continues to decrease before initiating task.  Pt completed all bathing tasks without verbal cues this morning when presented with bathing supplies.  Pt initiated standing to walk to toilet to void while standing up.  Pt completed all tasks at HHA/steady A level.  Pt initiated all dressing tasks when presented with clothing.  Focus on activity tolerance, task initiation, attention to task, safety awareness, sit<>stand, standing balance, and safety awareness.  Therapy Documentation Precautions:  Precautions Precautions: Fall Precaution Comments: PEG/abdominal precautions, Bilateral thigh high TED hose  Restrictions Weight Bearing Restrictions: No Pain: Pain Assessment Pain Assessment: No/denies pain  See FIM for current functional status  Therapy/Group: Individual Therapy  Leroy Libman 09/01/2014, 8:03 AM

## 2014-09-01 NOTE — Progress Notes (Signed)
Speech Language Pathology Daily Session Note  Patient Details  Name: Tony Davis MRN: [id number] Date of Birth: [birth year]  Today's Date: 09/01/2014 SLP Individual Time: 1000-1100 SLP Individual Time Calculation (min): 60 min  Short Term Goals: Week 5: SLP Short Term Goal 1 (Week 5): Patient will utilize swallowing compensatory strategies with current diet to minimize overt s/s of aspiration with supervision multimodal cues.  SLP Short Term Goal 2 (Week 5): Patient will initiate functional tasks with Min A multimodal cues.  SLP Short Term Goal 3 (Week 5): Patient will initiate verbal response to yes/no questions with Min multimodal cues. SLP Short Term Goal 4 (Week 5): Patient will follow basic, 1-step directions during self-care tasks with supervision multimodal cues. SLP Short Term Goal 5 (Week 5): Patient will demonstrate use of call bell with Max multimodal cues. SLP Short Term Goal 6 (Week 5): Patient will sustain attention to basic self-care task for ~5 minutes with Mod multimodal cues.   Skilled Therapeutic Interventions: Skilled treatment session focused on dysphagia and cognitive-linguistic goals. Student facilitated session by providing Total-Max A multimodal cues for initiation and selective attention in a mildly distracting environment during trials of Dys. 2 textures and thin liquids. Patient demonstrated efficient mastication without overt s/s aspiration, therefore, recommend trial tray of Dys. 2 textures before upgrade. Student also facilitated session by providing Total-Max A multimodal cues for initiation, problem solving and verbal expression during a basic card game. Patient left in wheelchair with quick release in place and all needs within reach. Continue with current plan of care.   FIM:  Comprehension Comprehension Mode: Auditory Comprehension: 2-Understands basic 25 - 49% of the time/requires cueing 51 - 75% of the time Expression Expression Mode:  Verbal Expression: 1-Expresses basis less than 25% of the time/requires cueing greater than 75% of the time. Social Interaction Social Interaction: 2-Interacts appropriately 25 - 49% of time - Needs frequent redirection. Problem Solving Problem Solving: 1-Solves basic less than 25% of the time - needs direction nearly all the time or does not effectively solve problems and may need a restraint for safety Memory Memory: 1-Recognizes or recalls less than 25% of the time/requires cueing greater than 75% of the time FIM - Eating Eating Activity: 4: Help with picking up utensils;4: Helper occasionally brings food to mouth;4: Helper occasionally scoops food on utensil;5: Needs verbal cues/supervision  Pain Pain Assessment Pain Assessment: No/denies pain  Therapy/Group: Individual Therapy  Nysa Sarin 09/01/2014, 11:11 AM

## 2014-09-01 NOTE — Progress Notes (Signed)
Subjective/Complaints: No new complaints. Up and alert in the bed this am. Review of Systems -remains limited due to cognitive status   Objective: Vital Signs: Blood pressure 136/86, pulse 74, temperature 97.9 F (36.6 C), temperature source Oral, resp. rate 19, weight 117.346 kg (258 lb 11.2 oz), SpO2 100.00%. No results found. Results for orders placed during the hospital encounter of 07/30/14 (from the past 72 hour(s))  GLUCOSE, CAPILLARY     Status: Abnormal   Collection Time    08/29/14 12:01 PM      Result Value Ref Range   Glucose-Capillary 111 (*) 70 - 99 mg/dL  GLUCOSE, CAPILLARY     Status: None   Collection Time    08/29/14  4:41 PM      Result Value Ref Range   Glucose-Capillary 88  70 - 99 mg/dL  GLUCOSE, CAPILLARY     Status: Abnormal   Collection Time    08/29/14  8:46 PM      Result Value Ref Range   Glucose-Capillary 143 (*) 70 - 99 mg/dL   Comment 1 Notify RN    GLUCOSE, CAPILLARY     Status: Abnormal   Collection Time    08/30/14  7:34 AM      Result Value Ref Range   Glucose-Capillary 114 (*) 70 - 99 mg/dL  GLUCOSE, CAPILLARY     Status: Abnormal   Collection Time    08/30/14 12:17 PM      Result Value Ref Range   Glucose-Capillary 104 (*) 70 - 99 mg/dL  GLUCOSE, CAPILLARY     Status: Abnormal   Collection Time    08/30/14  3:59 PM      Result Value Ref Range   Glucose-Capillary 100 (*) 70 - 99 mg/dL  GLUCOSE, CAPILLARY     Status: None   Collection Time    08/30/14  8:55 PM      Result Value Ref Range   Glucose-Capillary 92  70 - 99 mg/dL  GLUCOSE, CAPILLARY     Status: Abnormal   Collection Time    08/31/14  7:56 AM      Result Value Ref Range   Glucose-Capillary 136 (*) 70 - 99 mg/dL  GLUCOSE, CAPILLARY     Status: None   Collection Time    08/31/14  9:53 AM      Result Value Ref Range   Glucose-Capillary 99  70 - 99 mg/dL   Comment 1 Documented in Chart    GLUCOSE, CAPILLARY     Status: None   Collection Time    08/31/14 11:27 AM       Result Value Ref Range   Glucose-Capillary 83  70 - 99 mg/dL   Comment 1 Notify RN    GLUCOSE, CAPILLARY     Status: None   Collection Time    08/31/14  4:34 PM      Result Value Ref Range   Glucose-Capillary 85  70 - 99 mg/dL   Comment 1 Notify RN    GLUCOSE, CAPILLARY     Status: Abnormal   Collection Time    08/31/14  8:29 PM      Result Value Ref Range   Glucose-Capillary 115 (*) 70 - 99 mg/dL  GLUCOSE, CAPILLARY     Status: None   Collection Time    09/01/14  7:03 AM      Result Value Ref Range   Glucose-Capillary 77  70 - 99 mg/dL   Comment 1 Notify RN  HEENT: neck clean. Trach site closed Cardio: RRR and no murmur Resp: CTA B/L and unlabored, clear.  GI: BS positive and PEG site clean. Extremity:  Bilateral pedal edema trace to 1+  Skin:   Intact other than above Neuro:   Initiates more. Better attention/awareness.  Abnormal Sensory reduced on left difficult to assess secondary to MS, Abnormal Motor 4 LUE, 3 to 3+ RLE, 3- LLE, 5/5 RUE grossly  Musc/Skel:  Normal GEN NAD   Assessment/Plan: 1. Functional deficits secondary to left Thalamic ICH, Hydrocephalus with Right  shunt which require 3+ hours per day of interdisciplinary therapy in a comprehensive inpatient rehab setting. Physiatrist is providing close team supervision and 24 hour management of active medical problems listed below. Physiatrist and rehab team continue to assess barriers to discharge/monitor patient progress toward functional and medical goals.  FIM: FIM - Bathing Bathing Steps Patient Completed: Chest;Right Arm;Left Arm;Abdomen;Front perineal area;Right upper leg;Left upper leg;Right lower leg (including foot);Left lower leg (including foot) Bathing: 4: Min-Patient completes 8-9 65f 10 parts or 75+ percent  FIM - Upper Body Dressing/Undressing Upper body dressing/undressing steps patient completed: Thread/unthread right sleeve of pullover shirt/dresss;Thread/unthread left sleeve of  pullover shirt/dress;Put head through opening of pull over shirt/dress;Pull shirt over trunk Upper body dressing/undressing: 5: Supervision: Safety issues/verbal cues FIM - Lower Body Dressing/Undressing Lower body dressing/undressing steps patient completed: Thread/unthread right underwear leg;Thread/unthread left underwear leg;Pull underwear up/down;Thread/unthread right pants leg;Thread/unthread left pants leg;Pull pants up/down Lower body dressing/undressing: 3: Mod-Patient completed 50-74% of tasks  FIM - Toileting Toileting steps completed by patient: Adjust clothing prior to toileting Toileting Assistive Devices: Grab bar or rail for support Toileting: 0: No continent bowel/bladder events this shift  FIM - Radio producer Devices: Grab bars Toilet Transfers: 4-To toilet/BSC: Min A (steadying Pt. > 75%);4-From toilet/BSC: Min A (steadying Pt. > 75%)  FIM - Bed/Chair Transfer Bed/Chair Transfer Assistive Devices: Arm rests;Walker Bed/Chair Transfer: 5: Supine > Sit: Supervision (verbal cues/safety issues);4: Bed > Chair or W/C: Min A (steadying Pt. > 75%)  FIM - Locomotion: Wheelchair Distance: 100 Locomotion: Wheelchair: 5: Travels 150 ft or more: maneuvers on rugs and over door sills with supervision, cueing or coaxing FIM - Locomotion: Ambulation Locomotion: Ambulation Assistive Devices: Administrator Ambulation/Gait Assistance: 4: Min guard;5: Supervision Locomotion: Ambulation: 4: Travels 150 ft or more with minimal assistance (Pt.>75%)  Comprehension Comprehension Mode: Auditory Comprehension: 3-Understands basic 50 - 74% of the time/requires cueing 25 - 50%  of the time  Expression Expression Mode: Verbal Expression Assistive Devices: 6-Talk trach valve Expression: 2-Expresses basic 25 - 49% of the time/requires cueing 50 - 75% of the time. Uses single words/gestures.  Social Interaction Social Interaction Mode: Asleep Social  Interaction: 2-Interacts appropriately 25 - 49% of time - Needs frequent redirection.  Problem Solving Problem Solving Mode: Asleep Problem Solving: 2-Solves basic 25 - 49% of the time - needs direction more than half the time to initiate, plan or complete simple activities  Memory Memory Mode: Asleep Memory: 1-Recognizes or recalls less than 25% of the time/requires cueing greater than 75% of the time  Medical Problem List and Plan:  1. Functional deficits secondary to Left thalamic bleed with hydrocephalus.  2. DVT Prophylaxis/Anticoagulation/has IVCF: Pharmaceutical: Lovenox-  -TEDs for edema, elevation  -hx of dvt  3. Chronic B-knee/Pain Management: Prn oxycodone.   Voltaren gel for knees.  -poorly-compliant pain patient in my practice 4. Mood: Monitor as mentation improves.  5. Neuropsych: This patient is not capable  of making decisions on his own behalf.   -continue   ritalin-- 6. Skin/Wound Care:   Continue air mattress overlay.   7. DM type 2: Will monitor BS with ac/hs checks. Continue Levemir bid    -reasonable control   8. Enterobacter tracheobronchitis: resolved 7. HTN/CAD:  -metoprolol bid  -continue HCTZ  -increased norvasc to 10mg  8. ABLA: on iron supplement.   9. Dysphagia: on d1 thins, still eating well  -off TF  -advance diet per SLP 10. Bladder-timed toilet   LOS (Days) 33 A FACE TO FACE EVALUATION WAS PERFORMED  Tony Davis,Tony Davis 09/01/2014, 8:14 AM

## 2014-09-01 NOTE — Progress Notes (Signed)
Social Work Patient ID: Tony Davis, male   DOB: [birth year], 53 y.o.   MRN: [id number]  Have reviewed team conference yesterday afternoon with pt's wife.  She continues to plan on SNF for pt as she cannot meet care needs.  She has no questions or concerns for me at this time.  Continue to follow.  Kenyatte Gruber, LCSW

## 2014-09-01 NOTE — Patient Care Conference (Signed)
Inpatient RehabilitationTeam Conference and Plan of Care Update Date: 08/31/2014   Time: 2:50 PM    Patient Name: Tony Davis      Medical Record Number: [id number]  Date of Birth: August 28, 1961 Sex: Male         Room/Bed: 4W05C/4W05C-01 Payor Info: Payor: MEDICARE / Plan: MEDICARE PART A AND B / Product Type: *No Product type* /    Admitting Diagnosis: deconditioning  Admit Date/Time:  07/30/2014  8:40 PM Admission Comments: No comment available   Primary Diagnosis:  <principal problem not specified> Principal Problem: <principal problem not specified>  Patient Active Problem List   Diagnosis Date Noted  . Basal ganglia hemorrhage 07/30/2014  . Syncope due to orthostatic hypotension 04/15/2014  . Medication adverse effect 04/15/2014  . Syncope 04/15/2014  . Routine general medical examination at a health care facility 02/11/2014  . BPH associated with nocturia 02/11/2014  . Gynecomastia 02/11/2014  . Osteoarthritis of left knee 08/21/2013  . Osteoarthritis of right knee 08/21/2013  . Insomnia, persistent 03/25/2013  . History of tobacco use 03/25/2013  . Chronic rhinitis 03/25/2013  . Hemiparesis due to old cerebral infarction 03/20/2013  . DJD (degenerative joint disease) of knee 10/13/2012  . Folate deficiency anemia 10/13/2012  . Long term (current) use of anticoagulants 03/28/2012  . DVT (deep venous thrombosis) 03/19/2012  . Obstructive sleep apnea 08/16/2011  . Type II or unspecified type diabetes mellitus without mention of complication, uncontrolled 08/06/2011  . PATENT FORAMEN OVALE 03/13/2010  . CVA 02/08/2010  . CAD, NATIVE VESSEL 01/26/2010  . Hyperlipidemia LDL goal < 100 01/23/2010  . Essential hypertension, benign 01/23/2010  . GERD 01/23/2010    Expected Discharge Date: Expected Discharge Date:  (SNF)  Team Members Present: Physician leading conference: Dr. Alger Simons Social Worker Present: Lennart Pall, LCSW Nurse Present: Elliot Cousin, RN PT  Present: Melene Plan, Cottie Banda, PT OT Present: Roanna Epley, [place], OT SLP Present: Weston Anna, SLP PPS Coordinator present : Daiva Nakayama, RN, CRRN     Current Status/Progress Goal Weekly Team Focus  Medical   gradual progress. still incontinent  contineud bowel and bladder mgt  skin, bowels, bladder   Bowel/Bladder   Patient incont blaader, cont bowel. Condom cath at [facility], does not alert staff   Mangage bowel and bladder with min assist  Offer patient urinal    Swallow/Nutrition/ Hydration   Dys. 1 textures with thin liquids, Mod A for use of strategies  Mod A  trials of Dys. 2 textures, increased use of swallowing compensatory strategies    ADL's   UB dressing-min A/supervision; LB dressing-mod A; functional transfers-min A/mod A; extra time to initiate with max verbal cues  mod A overall, min A UB dressing  attention, orientation, transfers, sitting balance, initiation   Mobility   Min-mod A for transfers; Min-mod A for ambulation, w/c propulsion, stair negotiation; mod-max A for attention, initiation and awareness  Supervision for sitting balance, bed mobility; Min A for standing balance, ambulation, w/c propulsion, stair negotiation; Mod A for cognition  activity tolerance, bed mobility, t/fs, balance, safety during funcitonal mobility, attention, initiation, orientation   Communication   Max A  Mod A  initiation of verbal responses    Safety/Cognition/ Behavioral Observations  max assist   mod assist  Continue to educate about calling for assistance   Pain   denies pain, voltaren to knees   less than 3 out of 10  assess pain and medicate as needed   Skin  Skin intact, patient on air mattress  No new skin breakdown  assess skin q shift      *See Care Plan and progress notes for long and short-term goals.  Barriers to Discharge: continue neuro issues which will be longer term    Possible Resolutions to Barriers:  supervision SNF    Discharge  Planning/Teaching Needs:  Plan has changed to SNF as family cannot meet 24/7 care needs      Team Discussion:  Currently supervision to min assist overall but initiation continues to be a barrier.  Some improved attention.  SW reports plan has changed to SNF as wife cannot meet care needs.  Keeping pt on D1, thin as not seeming to tolerate upgrade.  Revisions to Treatment Plan:  D/c plan changed to SNF   Continued Need for Acute Rehabilitation Level of Care: The patient requires daily medical management by a physician with specialized training in physical medicine and rehabilitation for the following conditions: Daily direction of a multidisciplinary physical rehabilitation program to ensure safe treatment while eliciting the highest outcome that is of practical value to the patient.: Yes Daily medical management of patient stability for increased activity during participation in an intensive rehabilitation regime.: Yes Daily analysis of laboratory values and/or radiology reports with any subsequent need for medication adjustment of medical intervention for : Post surgical problems;Neurological problems  Leliana Kontz 09/01/2014, 7:49 AM

## 2014-09-01 NOTE — Progress Notes (Signed)
The skilled treatment note has been reviewed and SLP is in agreement.  Noris Kulinski, M.A., CCC-SLP  [phone]   

## 2014-09-02 ENCOUNTER — Ambulatory Visit (HOSPITAL_COMMUNITY): Payer: Medicare Other | Admitting: Speech Pathology

## 2014-09-02 ENCOUNTER — Inpatient Hospital Stay (HOSPITAL_COMMUNITY): Payer: Medicare Other | Admitting: Speech Pathology

## 2014-09-02 ENCOUNTER — Inpatient Hospital Stay (HOSPITAL_COMMUNITY): Payer: Medicare Other

## 2014-09-02 ENCOUNTER — Encounter (HOSPITAL_COMMUNITY): Payer: Medicare Other

## 2014-09-02 LAB — GLUCOSE, CAPILLARY
GLUCOSE-CAPILLARY: 124 mg/dL — AB (ref 70–99)
Glucose-Capillary: 90 mg/dL (ref 70–99)
Glucose-Capillary: 115 mg/dL — ABNORMAL HIGH (ref 70–99)
Glucose-Capillary: 110 mg/dL — ABNORMAL HIGH (ref 70–99)

## 2014-09-02 NOTE — Progress Notes (Signed)
Occupational Therapy Weekly Progress Note  Patient Details  Name: Tony Davis MRN: [id number] Date of Birth: 10/03/1961  Beginning of progress report period: August 25, 2014 End of progress report period: September 02, 2014  Patient has met 3 of 3 short term goals.  Pt has made steady progress with BADLs during this past week.  Pt continues to require mod/max verbal and occasional multimodal cues to initiate tasks.  Pt continues to require extra time to respond to commands and initiate tasks.  Pt continues to require min/mod verbal cues for redirection to task or when perseverating on bathing tasks.  Pt is min/supervision for stand pivot transfers in/out of walk in shower.  Patient continues to demonstrate the following deficits: Impaired cognition (decreased initiation, orientation, sequencing, attention, memory, safety awareness), impaired endurance,  Impaired balance and therefore will continue to benefit from skilled OT intervention to enhance overall performance of BADL with reduced burden of care.  Patient progressing toward long term goals..  Continue plan of care.  OT Short Term Goals Week 4:  OT Short Term Goal 1 (Week 4): Pt will perform tub/shower transfer with min A OT Short Term Goal 2 (Week 4): Pt will perform bathing tasks with min A OT Short Term Goal 3 (Week 4): Pt will perform toilet transfers with min A Week 5:  OT Short Term Goal 1 (Week 5): Pt will perform toileting tasks with mod A OT Short Term Goal 2 (Week 5): Pt will maintain dynamic sitting balance during ADLs with min A  Therapy Documentation Precautions:  Precautions Precautions: Fall Precaution Comments: PEG/abdominal precautions, Bilateral thigh high TED hose  Restrictions Weight Bearing Restrictions: No   Pain: Pain Assessment Pain Assessment: Faces Faces Pain Scale: Hurts little more Pain Type: Chronic pain Pain Location: Knee Pain Orientation: Right Pain Descriptors / Indicators:  Aching Pain Intervention(s): Repositioned;Rest  See FIM for current functional status  Leroy Libman 09/02/2014, 11:52 AM

## 2014-09-02 NOTE — Plan of Care (Signed)
Problem: RH SAFETY Goal: RH STG ADHERE TO SAFETY PRECAUTIONS W/ASSISTANCE/DEVICE STG Adhere to Safety Precautions With Max Assistance/Device.  Outcome: Not Progressing Still has poor safety awareness, bed alarm and quick release belt.

## 2014-09-02 NOTE — Plan of Care (Signed)
Problem: RH BLADDER ELIMINATION Goal: RH STG MANAGE BLADDER WITH ASSISTANCE STG Manage Bladder With max Assistance  Outcome: Progressing Incont. Mostly @ night, wears condom cath.

## 2014-09-02 NOTE — Progress Notes (Signed)
The skilled treatment note has been reviewed and SLP is in agreement.  Dionicia Cerritos, M.A., CCC-SLP  [phone]   

## 2014-09-02 NOTE — Progress Notes (Signed)
Speech Language Pathology Daily Session Note  Patient Details  Name: Tony Davis MRN: [id number] Date of Birth: Sep 30, 1961  Today's Date: 09/02/2014 SLP Individual Time: 1300-1330 SLP Individual Time Calculation (min): 30 min  Short Term Goals: Week 5: SLP Short Term Goal 1 (Week 5): Patient will utilize swallowing compensatory strategies with current diet to minimize overt s/s of aspiration with supervision multimodal cues.  SLP Short Term Goal 2 (Week 5): Patient will initiate functional tasks with Min A multimodal cues.  SLP Short Term Goal 3 (Week 5): Patient will initiate verbal response to yes/no questions with Min multimodal cues. SLP Short Term Goal 4 (Week 5): Patient will follow basic, 1-step directions during self-care tasks with supervision multimodal cues. SLP Short Term Goal 5 (Week 5): Patient will demonstrate use of call bell with Max multimodal cues. SLP Short Term Goal 6 (Week 5): Patient will sustain attention to basic self-care task for ~5 minutes with Mod multimodal cues.   Skilled Therapeutic Interventions: Skilled treatment session focused on dysphagia and cognitive-linguistic goals. Student facilitated session by providing Mod A multimodal cues for initiation of self-feeding with upgraded trial lunch tray of Dys. 2 textures and thin liquids. Patient initially required Mod A multimodal cues for utilization of swallowing compensatory strategies of small bites which progressed to Max A multimodal cues by end of session, suspect due to fatigue and decreased attention in a moderately distracting environment. Patient demonstrated efficient mastication and had no overt s/s of aspiration, therefore, recommend upgrade diet to Dys. 2 textures and thin liquids with full supervision. Student also facilitated session by providing Total A for recall of items on lunch tray and events from previous therapy sessions. Patient also demonstrated language of confusion and verbalized having  stomach pains at end of session, notified RN. Patient left at RN station in recliner with quick release belt in place. Continue with current plan of care.   FIM:  Comprehension Comprehension Mode: Auditory Comprehension: 2-Understands basic 25 - 49% of the time/requires cueing 51 - 75% of the time Expression Expression Mode: Verbal Expression: 2-Expresses basic 25 - 49% of the time/requires cueing 50 - 75% of the time. Uses single words/gestures. Social Interaction Social Interaction: 2-Interacts appropriately 25 - 49% of time - Needs frequent redirection. Problem Solving Problem Solving: 1-Solves basic less than 25% of the time - needs direction nearly all the time or does not effectively solve problems and may need a restraint for safety Memory Memory: 1-Recognizes or recalls less than 25% of the time/requires cueing greater than 75% of the time FIM - Eating Eating Activity: 4: Help with managing cup/glass;4: Helper occasionally scoops food on utensil;5: Needs verbal cues/supervision  Pain Pain Assessment Pain Assessment: 0-10 Pain Score:  (unable to rate) Pain Type: Acute pain Pain Location: Abdomen Pain Orientation: Left Pain Descriptors / Indicators: Aching Pain Onset: Gradual Patients Stated Pain Goal:  (unable to state) Pain Intervention(s): RN made aware Multiple Pain Sites: No  Therapy/Group: Individual Therapy  Tahesha Skeet 09/02/2014, 3:53 PM

## 2014-09-02 NOTE — Progress Notes (Signed)
Physical Therapy Session Note  Patient Details  Name: Tony Davis MRN: [id number] Date of Birth: May 24, 1961  Today's Date: 09/02/2014 PT Individual Time:  -      Short Term Goals: Week 5:  PT Short Term Goal 1 (Week 5): STGs-LTGs due to anticipated LOS  Skilled Therapeutic Interventions/Progress Updates:  1:1. Pt received sitting in w/c at nurses station, ready for therapy. Focus this session on cognitive remediation, functional endurance and therex. Pt req total cues for orientation during session, min cues for initiation and min-mod cues for sustained>selective attention. Pt able to propel w/c 100'x2 w/ B UE and supervision, however, req intermittent mod cues for redirection to task in busy hallway environment to maintain functional pace. Pt amb 300'x1 and 150'x1 w/ close(S) and use of shopping cart to facilitate more fluid reciprocal pattern with tactile cues to maintain good pace. Pt reporting pain in R knee following standing mobility, functional tasks modified to alleviate pain and denied need for pain medcine. Pt engaged in pipe tree model to challenge problem solving and sustained attention in quiet day room, pt req max step-by step cues for completion. Noted that pt has started closing R eye, using L eye to look at diagrams and print, pt reports having blurry vision. Pt utilized NuStep for B UE/LE strength and endurance with decreased WB due to reported R knee pain, excellent tolerance to level 3x85min with min cues to maintain pace in 40s. Pt left sitting in w/c at end of session at nurses station w/ quick release belt in place.   Therapy Documentation Precautions:  Precautions Precautions: Fall Precaution Comments: PEG/abdominal precautions, Bilateral thigh high TED hose  Restrictions Weight Bearing Restrictions: No   Pain: Pain Assessment Pain Assessment: Faces Faces Pain Scale: Hurts little more Pain Type: Chronic pain Pain Location: Knee Pain Orientation: Right Pain  Descriptors / Indicators: Aching Pain Intervention(s): Repositioned;Rest  See FIM for current functional status  Therapy/Group: Individual Therapy  Gilmore Laroche 09/02/2014, 9:58 AM

## 2014-09-02 NOTE — Progress Notes (Signed)
Subjective/Complaints: No new issues. Slept well Review of Systems -remains limited due to cognitive status   Objective: Vital Signs: Blood pressure 142/86, pulse 76, temperature 97.7 F (36.5 C), temperature source Oral, resp. rate 18, weight 117.346 kg (258 lb 11.2 oz), SpO2 100.00%. No results found. Results for orders placed during the hospital encounter of 07/30/14 (from the past 72 hour(s))  GLUCOSE, CAPILLARY     Status: Abnormal   Collection Time    08/30/14 12:17 PM      Result Value Ref Range   Glucose-Capillary 104 (*) 70 - 99 mg/dL  GLUCOSE, CAPILLARY     Status: Abnormal   Collection Time    08/30/14  3:59 PM      Result Value Ref Range   Glucose-Capillary 100 (*) 70 - 99 mg/dL  GLUCOSE, CAPILLARY     Status: None   Collection Time    08/30/14  8:55 PM      Result Value Ref Range   Glucose-Capillary 92  70 - 99 mg/dL  GLUCOSE, CAPILLARY     Status: Abnormal   Collection Time    08/31/14  7:56 AM      Result Value Ref Range   Glucose-Capillary 136 (*) 70 - 99 mg/dL  GLUCOSE, CAPILLARY     Status: None   Collection Time    08/31/14  9:53 AM      Result Value Ref Range   Glucose-Capillary 99  70 - 99 mg/dL   Comment 1 Documented in Chart    GLUCOSE, CAPILLARY     Status: None   Collection Time    08/31/14 11:27 AM      Result Value Ref Range   Glucose-Capillary 83  70 - 99 mg/dL   Comment 1 Notify RN    GLUCOSE, CAPILLARY     Status: None   Collection Time    08/31/14  4:34 PM      Result Value Ref Range   Glucose-Capillary 85  70 - 99 mg/dL   Comment 1 Notify RN    GLUCOSE, CAPILLARY     Status: Abnormal   Collection Time    08/31/14  8:29 PM      Result Value Ref Range   Glucose-Capillary 115 (*) 70 - 99 mg/dL  GLUCOSE, CAPILLARY     Status: None   Collection Time    09/01/14  7:03 AM      Result Value Ref Range   Glucose-Capillary 77  70 - 99 mg/dL   Comment 1 Notify RN    GLUCOSE, CAPILLARY     Status: None   Collection Time    09/01/14  11:39 AM      Result Value Ref Range   Glucose-Capillary 92  70 - 99 mg/dL   Comment 1 Notify RN    GLUCOSE, CAPILLARY     Status: None   Collection Time    09/01/14  4:27 PM      Result Value Ref Range   Glucose-Capillary 73  70 - 99 mg/dL  GLUCOSE, CAPILLARY     Status: Abnormal   Collection Time    09/01/14  9:04 PM      Result Value Ref Range   Glucose-Capillary 118 (*) 70 - 99 mg/dL  GLUCOSE, CAPILLARY     Status: None   Collection Time    09/02/14  6:51 AM      Result Value Ref Range   Glucose-Capillary 90  70 - 99 mg/dL  HEENT: neck clean. Trach site closed Cardio: RRR and no murmur Resp: CTA B/L and unlabored, clear.  GI: BS positive and PEG site clean. Extremity:  Bilateral pedal edema trace to 1+  Skin:   Intact other than above Neuro:   Initiates more. Better attention/awareness.  Abnormal Sensory reduced on left difficult to assess secondary to MS, Abnormal Motor 4 LUE,  3+ to 4- RLE, 3 LLE, 5/5 RUE  Musc/Skel:  Normal GEN NAD   Assessment/Plan: 1. Functional deficits secondary to left Thalamic ICH, Hydrocephalus with Right  shunt which require 3+ hours per day of interdisciplinary therapy in a comprehensive inpatient rehab setting. Physiatrist is providing close team supervision and 24 hour management of active medical problems listed below. Physiatrist and rehab team continue to assess barriers to discharge/monitor patient progress toward functional and medical goals.  FIM: FIM - Bathing Bathing Steps Patient Completed: Chest;Right Arm;Left Arm;Abdomen;Front perineal area;Right upper leg;Left upper leg;Right lower leg (including foot);Left lower leg (including foot) Bathing: 4: Min-Patient completes 8-9 77f 10 parts or 75+ percent  FIM - Upper Body Dressing/Undressing Upper body dressing/undressing steps patient completed: Thread/unthread right sleeve of pullover shirt/dresss;Thread/unthread left sleeve of pullover shirt/dress;Put head through opening of  pull over shirt/dress;Pull shirt over trunk Upper body dressing/undressing: 5: Supervision: Safety issues/verbal cues FIM - Lower Body Dressing/Undressing Lower body dressing/undressing steps patient completed: Thread/unthread right underwear leg;Thread/unthread left underwear leg;Pull underwear up/down;Thread/unthread right pants leg;Thread/unthread left pants leg;Pull pants up/down Lower body dressing/undressing: 3: Mod-Patient completed 50-74% of tasks  FIM - Toileting Toileting steps completed by patient: Adjust clothing prior to toileting Toileting Assistive Devices: Grab bar or rail for support Toileting: 0: No continent bowel/bladder events this shift  FIM - Radio producer Devices: Grab bars Toilet Transfers: 4-To toilet/BSC: Min A (steadying Pt. > 75%);4-From toilet/BSC: Min A (steadying Pt. > 75%)  FIM - Bed/Chair Transfer Bed/Chair Transfer Assistive Devices: Arm rests;Walker Bed/Chair Transfer: 5: Supine > Sit: Supervision (verbal cues/safety issues);4: Bed > Chair or W/C: Min A (steadying Pt. > 75%)  FIM - Locomotion: Wheelchair Distance: 100 Locomotion: Wheelchair: 0: Activity did not occur FIM - Locomotion: Ambulation Locomotion: Ambulation Assistive Devices: Administrator Ambulation/Gait Assistance: 5: Supervision;4: Min guard Locomotion: Ambulation: 4: Travels 150 ft or more with minimal assistance (Pt.>75%)  Comprehension Comprehension Mode: Auditory Comprehension: 2-Understands basic 25 - 49% of the time/requires cueing 51 - 75% of the time  Expression Expression Mode: Verbal Expression Assistive Devices: 6-Talk trach valve Expression: 1-Expresses basis less than 25% of the time/requires cueing greater than 75% of the time.  Social Interaction Social Interaction Mode: Asleep Social Interaction: 2-Interacts appropriately 25 - 49% of time - Needs frequent redirection.  Problem Solving Problem Solving Mode: Asleep Problem  Solving: 1-Solves basic less than 25% of the time - needs direction nearly all the time or does not effectively solve problems and may need a restraint for safety  Memory Memory Mode: Asleep Memory: 2-Recognizes or recalls 25 - 49% of the time/requires cueing 51 - 75% of the time  Medical Problem List and Plan:  1. Functional deficits secondary to Left thalamic bleed with hydrocephalus.  2. DVT Prophylaxis/Anticoagulation/has IVCF: Pharmaceutical: Lovenox-  -TEDs for edema, elevation  -hx of dvt  3. Chronic B-knee/Pain Management: Prn oxycodone.   Voltaren gel for knees.  -poorly-compliant pain patient in my practice 4. Mood: Monitor as mentation improves.  5. Neuropsych: This patient is not capable of making decisions on his own behalf.   -continue  ritalin-- 6. Skin/Wound Care:   Continue air mattress overlay.   7. DM type 2: Will monitor BS with ac/hs checks. Continue Levemir bid    -reasonable control   8. Enterobacter tracheobronchitis: resolved 7. HTN/CAD:  -metoprolol bid  -continue HCTZ  -  norvasc  10mg  8. ABLA: on iron supplement.   9. Dysphagia: on d1 thins, still eating well  -off TF  -advance diet per SLP 10. Bladder-continued timed toilet  -remains incontinent   LOS (Days) 34 A FACE TO FACE EVALUATION WAS PERFORMED  Katanya Schlie T 09/02/2014, 8:01 AM

## 2014-09-02 NOTE — Progress Notes (Signed)
Speech Language Pathology Daily Session Note  Patient Details  Name: Tony Davis MRN: [id number] Date of Birth: Sep 04, 1961  Today's Date: 09/02/2014 SLP Individual Time: 1300-1330 SLP Individual Time Calculation (min): 30 min  Short Term Goals: Week 5: SLP Short Term Goal 1 (Week 5): Patient will utilize swallowing compensatory strategies with current diet to minimize overt s/s of aspiration with supervision multimodal cues.  SLP Short Term Goal 2 (Week 5): Patient will initiate functional tasks with Min A multimodal cues.  SLP Short Term Goal 3 (Week 5): Patient will initiate verbal response to yes/no questions with Min multimodal cues. SLP Short Term Goal 4 (Week 5): Patient will follow basic, 1-step directions during self-care tasks with supervision multimodal cues. SLP Short Term Goal 5 (Week 5): Patient will demonstrate use of call bell with Max multimodal cues. SLP Short Term Goal 6 (Week 5): Patient will sustain attention to basic self-care task for ~5 minutes with Mod multimodal cues.   Skilled Therapeutic Interventions: Skilled treatment session focused on cognitive-linguistic goals. Student facilitated session by providing Total A multimodal cues for initiation, problem solving and verbal expression during a basic card game with intermittent language of confusion. Patient verbalized having a headache as well as fatigue, suspect this contributed to need for increased cueing during session. Continue with current plan of care.    FIM:  Comprehension Comprehension Mode: Auditory Comprehension: 2-Understands basic 25 - 49% of the time/requires cueing 51 - 75% of the time Expression Expression Mode: Verbal Expression: 2-Expresses basic 25 - 49% of the time/requires cueing 50 - 75% of the time. Uses single words/gestures. Social Interaction Social Interaction: 2-Interacts appropriately 25 - 49% of time - Needs frequent redirection. Problem Solving Problem Solving: 1-Solves basic  less than 25% of the time - needs direction nearly all the time or does not effectively solve problems and may need a restraint for safety Memory Memory: 1-Recognizes or recalls less than 25% of the time/requires cueing greater than 75% of the time FIM - Eating Eating Activity: 4: Help with managing cup/glass;4: Helper occasionally scoops food on utensil;5: Needs verbal cues/supervision  Pain Pain Assessment Pain Assessment: 0-10 Pain Score:  (unable to rate) Pain Type: Acute pain Pain Location: Head Pain Orientation: Right Pain Descriptors / Indicators: Aching Pain Onset: Gradual Patients Stated Pain Goal:  (unable to state) Pain Intervention(s): RN made aware;Repositioned;Distraction;Environmental changes Multiple Pain Sites: No  Therapy/Group: Individual Therapy  Jakalyn Kratky 09/02/2014, 3:51 PM

## 2014-09-02 NOTE — Progress Notes (Signed)
The skilled treatment note has been reviewed and SLP is in agreement.  Zackary Mckeone, M.A., CCC-SLP  [phone]   

## 2014-09-02 NOTE — Progress Notes (Signed)
Occupational Therapy Session Note  Patient Details  Name: Tony Davis MRN: [id number] Date of Birth: Nov 22, 1961  Today's Date: 09/02/2014 OT Individual Time: 0700-0800 OT Individual Time Calculation (min): 60 min    Short Term Goals: Week 4:  OT Short Term Goal 1 (Week 4): Pt will perform tub/shower transfer with min A OT Short Term Goal 2 (Week 4): Pt will perform bathing tasks with min A OT Short Term Goal 3 (Week 4): Pt will perform toilet transfers with min A  Skilled Therapeutic Interventions/Progress Updates:    Pt engaged in BADL retraining including bathing at shower level and dressing with sit<>stand from w/c at sink.  Pt required min verbal cues to initiate sitting EOB and max verbal cues with extra time to transfer to w/c.  Pt initiated all bathing and dressing tasks this morning when presented with supplies/clothing.  Pt required min verbal cues for redirection when distracted by noises in hallway while dressing.  Pt performed tub bench->w/c transfer with close supervision this morning.  Focus on activity tolerance, task initiation, sequencing, attention to task, safety awareness, sit<>stand, standing balance, and transfers.  Therapy Documentation Precautions:  Precautions Precautions: Fall Precaution Comments: PEG/abdominal precautions, Bilateral thigh high TED hose  Restrictions Weight Bearing Restrictions: No  See FIM for current functional status  Therapy/Group: Individual Therapy  Leroy Libman 09/02/2014, 8:01 AM

## 2014-09-03 ENCOUNTER — Encounter (HOSPITAL_COMMUNITY): Payer: Medicare Other

## 2014-09-03 ENCOUNTER — Inpatient Hospital Stay (HOSPITAL_COMMUNITY): Payer: Medicare Other | Admitting: Speech Pathology

## 2014-09-03 ENCOUNTER — Inpatient Hospital Stay (HOSPITAL_COMMUNITY): Payer: Medicare Other

## 2014-09-03 DIAGNOSIS — I619 Nontraumatic intracerebral hemorrhage, unspecified: Secondary | ICD-10-CM

## 2014-09-03 DIAGNOSIS — I10 Essential (primary) hypertension: Secondary | ICD-10-CM

## 2014-09-03 DIAGNOSIS — Z5189 Encounter for other specified aftercare: Secondary | ICD-10-CM

## 2014-09-03 DIAGNOSIS — IMO0002 Reserved for concepts with insufficient information to code with codable children: Secondary | ICD-10-CM

## 2014-09-03 DIAGNOSIS — M1710 Unilateral primary osteoarthritis, unspecified knee: Secondary | ICD-10-CM

## 2014-09-03 DIAGNOSIS — J9600 Acute respiratory failure, unspecified whether with hypoxia or hypercapnia: Secondary | ICD-10-CM

## 2014-09-03 LAB — GLUCOSE, CAPILLARY
GLUCOSE-CAPILLARY: 100 mg/dL — AB (ref 70–99)
GLUCOSE-CAPILLARY: 99 mg/dL (ref 70–99)
GLUCOSE-CAPILLARY: 114 mg/dL — AB (ref 70–99)
Glucose-Capillary: 117 mg/dL — ABNORMAL HIGH (ref 70–99)
Glucose-Capillary: 143 mg/dL — ABNORMAL HIGH (ref 70–99)

## 2014-09-03 LAB — CBC
HCT: 41.2 % (ref 39.0–52.0)
Hemoglobin: 13.2 g/dL (ref 13.0–17.0)
MCH: 28.6 pg (ref 26.0–34.0)
MCHC: 32 g/dL (ref 30.0–36.0)
MCV: 89.2 fL (ref 78.0–100.0)
PLATELETS: 304 10*3/uL (ref 150–400)
RBC: 4.62 MIL/uL (ref 4.22–5.81)
RDW: 13.8 % (ref 11.5–15.5)
WBC: 6.6 10*3/uL (ref 4.0–10.5)

## 2014-09-03 LAB — BASIC METABOLIC PANEL
ANION GAP: 14 (ref 5–15)
BUN: 11 mg/dL (ref 6–23)
CHLORIDE: 98 meq/L (ref 96–112)
CO2: 31 meq/L (ref 19–32)
CREATININE: 1.09 mg/dL (ref 0.50–1.35)
Calcium: 10.3 mg/dL (ref 8.4–10.5)
GFR calc Af Amer: 90 mL/min — ABNORMAL LOW (ref >90)
GFR calc non Af Amer: 77 mL/min — ABNORMAL LOW (ref >90)
Glucose, Bld: 113 mg/dL — ABNORMAL HIGH (ref 70–99)
POTASSIUM: 3 meq/L — AB (ref 3.7–5.3)
Sodium: 143 mEq/L (ref 137–147)

## 2014-09-03 MED ORDER — POTASSIUM CHLORIDE CRYS ER 20 MEQ PO TBCR: 20.0000 meq | EXTENDED_RELEASE_TABLET | Freq: Three times a day (TID) | ORAL | Status: DC

## 2014-09-03 MED ORDER — POTASSIUM CHLORIDE CRYS ER 20 MEQ PO TBCR
20.0000 meq | EXTENDED_RELEASE_TABLET | Freq: Two times a day (BID) | ORAL | Status: DC
  Administered 2014-09-05 – 2014-09-06 (×2): 20 meq via ORAL
  Filled 2014-09-03 (×4): qty 1

## 2014-09-03 MED ORDER — POTASSIUM CHLORIDE CRYS ER 20 MEQ PO TBCR: 20.0000 meq | EXTENDED_RELEASE_TABLET | Freq: Two times a day (BID) | ORAL | Status: DC

## 2014-09-03 MED ORDER — POTASSIUM CHLORIDE CRYS ER 20 MEQ PO TBCR
20.0000 meq | EXTENDED_RELEASE_TABLET | Freq: Three times a day (TID) | ORAL | Status: AC
  Administered 2014-09-03 – 2014-09-05 (×6): 20 meq via ORAL
  Filled 2014-09-03 (×6): qty 1

## 2014-09-03 NOTE — Progress Notes (Signed)
NUTRITION FOLLOW UP  Intervention:   -Continue Magic cup TID between meals, each supplement provides 290 kcal and 9 grams of protein.   -Continue Ensure Complete po BID, each supplement provides 350 kcal and 13 grams of protein.   -Encourage PO intake.  Nutrition Dx:   Inadequate oral intake related to inability to eat as evidenced by NPO status; Resolved.  Goal:   Inadequate oral intake now related to dysphagia and cognition as evidenced by dysphagia 1 diet; improving  Monitor:   PO intake, labs, weights, I/O's  Admitting Dx: Basal Ganglia Hemorrhage  Assessment:   Tony Davis is a 53 year old male with h/o DM type 2, CAD, OSA, chronic pain bilateral knees, CVA '11, PFO-on chronic coumadin who was admitted to OSH on 06/13/14 with left basal ganglia hemorrhage with intraventricular extension and hydrocephalus requiring right crani for evacuation as well as VP shunt placement Hospital course complicated by acute respiratory failure requiring tracheostomy as well as PEG placement, BLE DVT requiring IVC placement as well as fevers due to enterobacter PNA. Patient with subsequent right hemiparesis, lethargy, nonverbal, requiring noxious stimuli for arousal, difficulty tracking, dysconjugate gaze as well as difficulty following commands   8/22-Pt endorsed decreased appetite, however was unable to determine when decreased PO intake began  -Is currently receiving feeding assistance, RD viewed breakfast tray with <25% completion. Pt had only consumed <50% of MagicCup protein supplement  -Weight appears to have maintained stable per previous medical records, fluctuates between 270-278 lbs in past 3 months (2.8% body weight, non-severe for time frame)  -Receiving Vital 1.5 at nocturnal feeds of 6pm-6am at 65 ml/hr w/Pro-stat TID. MD requesting advancing to bolus feeds until PO intake improves  -Modified regimen to Vital 1.5 at 250 ml TID between meals   8/24-Dietitian was notified for  initiation of full bolus tube feedings. Spoke with RN, pt has been tolerating the bolus tube feeding. Spoke with pt, pt reports having stomach pains, which had started yesterday.  -Pt was receiving bolus tube feeding:Vital 1.5 @ 250 ml TID with 30 ml Prostat via PEG TID. Tube feeding regimen provides 1425 kcal (70% of needs), 96 grams of protein (96% est protein needs), and 573 ml of H2O.  -Increased orders to Vital 1.5 to 450 ml TID and provide 30 ml Prostat per PEG once daily to provide 2125 kcal, 106 grams of protein, and 1026 ml of H2O.   8/25- Spoke with RN, she reports pt has been tolerating the bolus tube feeding well. Pt did have two large, loose bowel movements last night, however had not had one today. Spoke with pt, pt reports he continues to have abdominal pains. Pains however were not serious. Spoke with PA about current TF regimen, discussed on continuing with current orders.   8/28- Pt reports he no longer has abdominal pains. Per RN, pt has been tolerating his tube feeding well.   9/4- Spoke with RN, pt was been tolerating his tube feeding well. She reports however that he has been having loose stools once a day. Pt with no stomach pains.Pt is receiving bolus tube feedings of Vital 1.5 formula at 450 ml TID with 30 ml Prostat once daily per PEG providing 2125 kcal, 106 grams of protein, and 1026 ml of H2O.   9/11-Calorie count- Day 1-Total intake:  1484 kcal (74% of minimum estimated needs)  71 protein (71% of minimum estimated needs)  Day 2-Total intake:  1941 kcal (97% of minimum estimated needs)  79 protein (  79% of minimum estimated needs)  Pt on dysphagia 1 with thin liquids. Bolus TF was discontinued.   9/16- Spoke with pt, pt reports having a good appetite, however meal completion over the past 2 days have been 0-40%. Prior to the past 2 days, pt's meal completion has been 100%. Pt was encouraged to eat his food at meals. Pt expressed understanding. Pt does report that he has  been drinking/eating his oral supplements.   9/21- Meal completion has varied from 25-100%. Spoke with pt, he reports his appetite has been decreased a bit. Pt was encouraged to eat his food at meals and to drink his Ensure. Spoke with RN, she reports he has not been drinking his Ensure. RN reports if po intake is poor will encourage po intake or Ensure intake.  9/25- Pt's diet upgraded to Dysphagia II. Spoke with Nurse Tech and RN who reported that pt's intake is varied. He is eating ~50% of his meals and sipping on Ensure Complete. Pt says that he is drinking supplements and eating some of his Magic Cups, but was not specific on amounts. He says that he has felt hungry before meals. Will continue to provide supplements and encourage adequate PO.  Height: Ht Readings from Last 1 Encounters:  07/30/14 5\' 11"  (1.803 m)    Weight Status:   Wt Readings from Last 1 Encounters:  08/25/14 258 lb 11.2 oz (117.346 kg)    Re-estimated needs:  Kcal: 2000-2200 Protein: 100-110 g Fluid: >/= 2.0 L/day  Skin: Intact  Diet Order: Dysphagia   Intake/Output Summary (Last 24 hours) at 09/03/14 1253 Last data filed at 09/03/14 1000  Gross per 24 hour  Intake    290 ml  Output    300 ml  Net    -10 ml    Last BM: 9/20   Labs:   Recent Labs Lab 09/03/14 0853  NA 143  K 3.0*  CL 98  CO2 31  BUN 11  CREATININE 1.09  CALCIUM 10.3  GLUCOSE 113*    CBG (last 3)   Recent Labs  09/02/14 2114 09/03/14 0651 09/03/14 1143  GLUCAP 124* 100* 99    Scheduled Meds: . amLODipine  5 mg Oral Daily  . antiseptic oral rinse  7 mL Mouth Rinse q12n4p  . atorvastatin  20 mg Oral q1800  . chlorhexidine  15 mL Mouth Rinse BID  . diclofenac sodium  2 g Topical QID  . enoxaparin (LOVENOX) injection  40 mg Subcutaneous Q24H  . famotidine  20 mg Oral Daily  . feeding supplement (ENSURE COMPLETE)  237 mL Oral BID  . ferrous sulfate  325 mg Oral BID WC  . free water  50 mL Per Tube q12n4p  .  hydrochlorothiazide  25 mg Oral Daily  . insulin aspart  0-5 Units Subcutaneous QHS  . insulin aspart  0-9 Units Subcutaneous TID WC  . insulin detemir  20 Units Subcutaneous BID  . methylphenidate  5 mg Per Tube BID  . metoprolol tartrate  75 mg Oral BID  . [START ON 09/05/2014] potassium chloride  20 mEq Oral BID   Followed by  . potassium chloride  20 mEq Oral TID  . saccharomyces boulardii  250 mg Oral BID    Continuous Infusions:   Terrace Arabia RD, LDN

## 2014-09-03 NOTE — Progress Notes (Signed)
Occupational Therapy Session Note  Patient Details  Name: Tony Davis MRN: [id number] Date of Birth: 1961-07-01  Today's Date: 09/03/2014 OT Individual Time: 0800-0900 OT Individual Time Calculation (min): 60 min    Short Term Goals: Week 5:  OT Short Term Goal 1 (Week 5): Pt will perform toileting tasks with mod A OT Short Term Goal 2 (Week 5): Pt will maintain dynamic sitting balance during ADLs with min A  Skilled Therapeutic Interventions/Progress Updates:    Pt engaged in BADL retraining including bathing at shower level and dressing with sit<>stand from w/c.  Pt required max multimodal cues to initiate all transitional movements with the exception of transfer from tub bench to w/c at completion of shower.  Pt required min verbal cues for sequencing and thoroughness during shower.  Pt performed sit<>stand at sink with supervision and assisted with fastening brief while standing.  Pt required total assist for orientation X 3.  Focus on cognitive remediation, transfers, sit<>stand, standing balance, and activity tolerance.  Therapy Documentation Precautions:  Precautions Precautions: Fall Precaution Comments: PEG/abdominal precautions, Bilateral thigh high TED hose  Restrictions Weight Bearing Restrictions: No Pain: Pain Assessment Pain Assessment: No/denies pain  See FIM for current functional status  Therapy/Group: Individual Therapy  Leroy Libman 09/03/2014, 9:01 AM

## 2014-09-03 NOTE — Plan of Care (Signed)
Problem: RH Balance Goal: LTG Patient will maintain dynamic standing balance (PT) LTG: Patient will maintain dynamic standing balance with assistance during mobility activities (PT)  Upgraded due to improved balance and endurance.     Problem: RH Bed to Chair Transfers Goal: LTG Patient will perform bed/chair transfers w/assist (PT) LTG: Patient will perform bed/chair transfers with assistance, with/without cues (PT).  Upgraded due to improved balance and endurance.  Problem: RH Ambulation Goal: LTG Patient will ambulate in controlled environment (PT) LTG: Patient will ambulate in a controlled environment, # of feet with assistance (PT).  Upgraded due to improved balance and endurance.  Problem: RH Wheelchair Mobility Goal: LTG Patient will propel w/c in controlled environment (PT) LTG: Patient will propel wheelchair in controlled environment, # of feet with assist (PT)  Upgraded due to strength and endurance.  Goal: LTG Patient will propel w/c in home environment (PT) LTG: Patient will propel wheelchair in home environment, # of feet with assistance (PT).  Outcome: Not Applicable Date Met:  61/60/73 Pt consistently ambulatory for short household distances.   Problem: RH Ambulation Goal: LTG Patient will ambulate in home environment (PT) LTG: Patient will ambulate in home environment, # of feet with assistance (PT).  Upgraded due to improved balance and endurance.

## 2014-09-03 NOTE — Progress Notes (Signed)
Physical Therapy Weekly Progress Note  Patient Details  Name: Tony Davis MRN: [id number] Date of Birth: 1961-09-14  Beginning of progress report period: August 27, 2014 End of progress report period: September 03, 2014  Today's Date: 09/03/2014 PT Individual Time: 1300-1400 PT Individual Time Calculation (min): 60 min   Pt continues to make slow, but steady progress in physical therapy and has achieved 3/12 LTGs regarding sitting balance, w/c propulsion in controlled environments and stair negotiation. Pt currently requires (S)-min A for bed mobility, supervision for sitting balance, min-mod A for transfers, close(S)-min A for ambulation >150' w/ RW, min-mod A for standing balance and min A for stair negotiation. Continued emphasis on cognitive remediation during functional tasks to target initiation, attention, awareness, sequencing and safety with mod-max cues. Pt's discharge plan has now changed to SNF as family unable to provide the recommended 24hr assistance.   Patient continues to demonstrate the following deficits: decreased functional endurance, decreased strength, decreased balance, decreased orientation, decreased initiation, decreased memory, decreased problem solving, decreased awareness, decreased attention, decreased functional transfers and mobility and therefore will continue to benefit from skilled PT intervention to enhance overall performance with activity tolerance, balance, functional use of right upper extremity, right lower extremity, left upper extremity and left lower extremity, attention, awareness, coordination and overall functional transfers and mobility.  Patient progressing toward long term goals. Plan of care revisions: LTGs upgraded due to improved balance, strength and endurance.  PT Short Term Goals Week 1:  PT Short Term Goal 1 (Week 1): Pt will transfer out of bed to w/c using maxi sky lift PT Short Term Goal 1 - Progress (Week 1): Met PT Short Term  Goal 2 (Week 1): Pt will tolerate sitting up in tilt in space w/c for 2 hours. PT Short Term Goal 2 - Progress (Week 1): Met PT Short Term Goal 3 (Week 1): Pt will roll in bed req 1 person assist PT Short Term Goal 3 - Progress (Week 1): Progressing toward goal PT Short Term Goal 4 (Week 1): Pt will tolerate supine/side lie to sit transfer req 2 person assist.  PT Short Term Goal 4 - Progress (Week 1): Met PT Short Term Goal 5 (Week 1): Pt will demonstrate sustained focus of an activity for 30 seconds.  PT Short Term Goal 5 - Progress (Week 1): Not met Week 2:  PT Short Term Goal 1 (Week 2): Pt will demonstrate focused attention to therapeutic task for 30 seconds w/ max multimodal cues PT Short Term Goal 1 - Progress (Week 2): Progressing toward goal PT Short Term Goal 2 (Week 2): Pt to perform bed mobility w/ max Ax1person PT Short Term Goal 2 - Progress (Week 2): Progressing toward goal PT Short Term Goal 3 (Week 2): Pt to transfer bed<>w/c with Ax2 persons without use of lift equipment PT Short Term Goal 3 - Progress (Week 2): Met PT Short Term Goal 4 (Week 2): Pt to demonstrate sitting balance with min guard A, 50% of time PT Short Term Goal 4 - Progress (Week 2): Met PT Short Term Goal 5 (Week 2): Pt will ambulate 100' w/ Ax2 persons PT Short Term Goal 5 - Progress (Week 2): Met Week 3:  PT Short Term Goal 1 (Week 3): Pt will demonstrated focused attention to therapeutic task for 30 seconds with max multimodal cues PT Short Term Goal 1 - Progress (Week 3): Met PT Short Term Goal 2 (Week 3): Pt to perform bed mobility with mod Ax1person, 50%  of time PT Short Term Goal 2 - Progress (Week 3): Met PT Short Term Goal 3 (Week 3): Pt to perform bed<>w/c transfers with max Ax1 person PT Short Term Goal 3 - Progress (Week 3): Met PT Short Term Goal 4 (Week 3): Pt to demonstrate standing balance with max Ax1person, 50% of time PT Short Term Goal 4 - Progress (Week 3): Met PT Short Term Goal 5  (Week 3): Pt to ambulate 100' with mod Ax1 person PT Short Term Goal 5 - Progress (Week 3): Met Week 4:  PT Short Term Goal 1 (Week 4): Pt to perform bed mobility with min Ax1person, 50% of time PT Short Term Goal 1 - Progress (Week 4): Met PT Short Term Goal 2 (Week 4): Pt to perform bed<>w/c transfers with min Ax1person, 50% of time PT Short Term Goal 2 - Progress (Week 4): Met PT Short Term Goal 3 (Week 4): Pt to propel w/c 100' with min Ax1person, 50% of time PT Short Term Goal 3 - Progress (Week 4): Met PT Short Term Goal 4 (Week 4): Pt to ambulate 150' with min Ax1 person, 50% of time PT Short Term Goal 4 - Progress (Week 4): Met PT Short Term Goal 5 (Week 4): Pt to negotiate up/down 5 steps w/ B rail and min Ax1person, 50% of time PT Short Term Goal 5 - Progress (Week 4): Met Week 5:  PT Short Term Goal 1 (Week 5): STGs-LTGs due to anticipated LOS Week 6:  PT Short Term Goal 1 (Week 6): STGs=upgraded LTGs due to anticipated LOS  Skilled Therapeutic Interventions/Progress Updates:  1:1. Pt received sitting in w/c falling asleep, easy to wake but very lethargic. Focus this session on alertness/arousal, cognitive remediation, balance and functional endurance. Pt req min-max A for initiation throughout session and total cues for orientation. Pt with progressive increase in alertness throughout session due to environmental stimulation. Pt req min-mod A for all t/f sit<>stand this session and min A for t/f sup<>sit EOB on standard bed in therapy apartment. Pt with good tolerance to ambulation 250'x1 and 200'x2 this session w/ RW, req close(S)-min guard A and tactile cues for pace. Pt engaged in seated (supervision) and standing (min-mod A) basketball toss to rebounder for stimulation and selective attention, req min-mod A for selective attention to counting reps up to 40. Attempted to engage pt in pipe tree assembly or game of tic-tac-toe, however, pt unable to sustain attention to tasks with max  multimodal cues. Pt assisted to sitting EOB at end of session, left in care of RN to provide medication before assisting to supine position.  Therapy Documentation Precautions:  Precautions Precautions: Fall Precaution Comments: PEG/abdominal precautions, Bilateral thigh high TED hose  Restrictions Weight Bearing Restrictions: No  See FIM for current functional status  Therapy/Group: Individual Therapy  Gilmore Laroche 09/03/2014, 4:41 PM

## 2014-09-03 NOTE — Progress Notes (Signed)
Subjective/Complaints: Denies any problems. Sleeping upon my arrival. Easily awakens Review of Systems -remains limited due to cognitive status   Objective: Vital Signs: Blood pressure 117/64, pulse 91, temperature 98.9 F (37.2 C), temperature source Oral, resp. rate 17, weight 117.346 kg (258 lb 11.2 oz), SpO2 100.00%. No results found. Results for orders placed during the hospital encounter of 07/30/14 (from the past 72 hour(s))  GLUCOSE, CAPILLARY     Status: Abnormal   Collection Time    08/31/14  7:56 AM      Result Value Ref Range   Glucose-Capillary 136 (*) 70 - 99 mg/dL  GLUCOSE, CAPILLARY     Status: None   Collection Time    08/31/14  9:53 AM      Result Value Ref Range   Glucose-Capillary 99  70 - 99 mg/dL   Comment 1 Documented in Chart    GLUCOSE, CAPILLARY     Status: None   Collection Time    08/31/14 11:27 AM      Result Value Ref Range   Glucose-Capillary 83  70 - 99 mg/dL   Comment 1 Notify RN    GLUCOSE, CAPILLARY     Status: None   Collection Time    08/31/14  4:34 PM      Result Value Ref Range   Glucose-Capillary 85  70 - 99 mg/dL   Comment 1 Notify RN    GLUCOSE, CAPILLARY     Status: Abnormal   Collection Time    08/31/14  8:29 PM      Result Value Ref Range   Glucose-Capillary 115 (*) 70 - 99 mg/dL  GLUCOSE, CAPILLARY     Status: None   Collection Time    09/01/14  7:03 AM      Result Value Ref Range   Glucose-Capillary 77  70 - 99 mg/dL   Comment 1 Notify RN    GLUCOSE, CAPILLARY     Status: None   Collection Time    09/01/14 11:39 AM      Result Value Ref Range   Glucose-Capillary 92  70 - 99 mg/dL   Comment 1 Notify RN    GLUCOSE, CAPILLARY     Status: None   Collection Time    09/01/14  4:27 PM      Result Value Ref Range   Glucose-Capillary 73  70 - 99 mg/dL  GLUCOSE, CAPILLARY     Status: Abnormal   Collection Time    09/01/14  9:04 PM      Result Value Ref Range   Glucose-Capillary 118 (*) 70 - 99 mg/dL  GLUCOSE,  CAPILLARY     Status: None   Collection Time    09/02/14  6:51 AM      Result Value Ref Range   Glucose-Capillary 90  70 - 99 mg/dL  GLUCOSE, CAPILLARY     Status: Abnormal   Collection Time    09/02/14 11:14 AM      Result Value Ref Range   Glucose-Capillary 115 (*) 70 - 99 mg/dL   Comment 1 Notify RN    GLUCOSE, CAPILLARY     Status: Abnormal   Collection Time    09/02/14  4:35 PM      Result Value Ref Range   Glucose-Capillary 110 (*) 70 - 99 mg/dL   Comment 1 Notify RN    GLUCOSE, CAPILLARY     Status: Abnormal   Collection Time    09/02/14  9:14 PM  Result Value Ref Range   Glucose-Capillary 124 (*) 70 - 99 mg/dL  GLUCOSE, CAPILLARY     Status: Abnormal   Collection Time    09/03/14  6:51 AM      Result Value Ref Range   Glucose-Capillary 100 (*) 70 - 99 mg/dL     HEENT: neck clean. Trach site closed Cardio: RRR and no murmur Resp: CTA B/L and unlabored, clear.  GI: BS positive and PEG site clean. Extremity:  Bilateral pedal edema trace to 1+  Skin:   Intact other than above Neuro:   Initiates more but still delayed. Improved attention/awareness.    Motor 4 LUE,  3+ to 4- RLE, 3 LLE, 5/5 RUE  Musc/Skel:  Normal GEN NAD   Assessment/Plan: 1. Functional deficits secondary to left Thalamic ICH, Hydrocephalus with Right  shunt which require 3+ hours per day of interdisciplinary therapy in a comprehensive inpatient rehab setting. Physiatrist is providing close team supervision and 24 hour management of active medical problems listed below. Physiatrist and rehab team continue to assess barriers to discharge/monitor patient progress toward functional and medical goals.  FIM: FIM - Bathing Bathing Steps Patient Completed: Chest;Right Arm;Left Arm;Abdomen;Front perineal area;Right upper leg;Left upper leg;Right lower leg (including foot);Left lower leg (including foot) Bathing: 4: Min-Patient completes 8-9 70f 10 parts or 75+ percent  FIM - Upper Body  Dressing/Undressing Upper body dressing/undressing steps patient completed: Thread/unthread right sleeve of pullover shirt/dresss;Thread/unthread left sleeve of pullover shirt/dress;Put head through opening of pull over shirt/dress;Pull shirt over trunk Upper body dressing/undressing: 5: Supervision: Safety issues/verbal cues FIM - Lower Body Dressing/Undressing Lower body dressing/undressing steps patient completed: Thread/unthread right underwear leg;Thread/unthread left underwear leg;Pull underwear up/down;Thread/unthread right pants leg;Thread/unthread left pants leg;Pull pants up/down Lower body dressing/undressing: 3: Mod-Patient completed 50-74% of tasks  FIM - Toileting Toileting steps completed by patient: Adjust clothing prior to toileting Toileting Assistive Devices: Grab bar or rail for support Toileting: 4: Assist with fasteners  FIM - Radio producer Devices: Grab bars Toilet Transfers: 4-To toilet/BSC: Min A (steadying Pt. > 75%);4-From toilet/BSC: Min A (steadying Pt. > 75%)  FIM - Bed/Chair Transfer Bed/Chair Transfer Assistive Devices: Arm rests;Walker Bed/Chair Transfer: 4: Bed > Chair or W/C: Min A (steadying Pt. > 75%);4: Chair or W/C > Bed: Min A (steadying Pt. > 75%)  FIM - Locomotion: Wheelchair Distance: 100 Locomotion: Wheelchair: 2: Travels 24 - 149 ft with supervision, cueing or coaxing FIM - Locomotion: Ambulation Locomotion: Ambulation Assistive Devices: Administrator Ambulation/Gait Assistance: 5: Supervision;4: Min guard Locomotion: Ambulation: 4: Travels 150 ft or more with minimal assistance (Pt.>75%)  Comprehension Comprehension Mode: Auditory Comprehension: 2-Understands basic 25 - 49% of the time/requires cueing 51 - 75% of the time  Expression Expression Mode: Verbal Expression Assistive Devices: 6-Talk trach valve Expression: 2-Expresses basic 25 - 49% of the time/requires cueing 50 - 75% of the time. Uses single  words/gestures.  Social Interaction Social Interaction Mode: Asleep Social Interaction: 2-Interacts appropriately 25 - 49% of time - Needs frequent redirection.  Problem Solving Problem Solving Mode: Asleep Problem Solving: 1-Solves basic less than 25% of the time - needs direction nearly all the time or does not effectively solve problems and may need a restraint for safety  Memory Memory Mode: Asleep Memory: 7-Complete Independence: No helper  Medical Problem List and Plan:  1. Functional deficits secondary to Left thalamic bleed with hydrocephalus.  2. DVT Prophylaxis/Anticoagulation/has IVCF: Pharmaceutical: Lovenox-  -TEDs for edema, elevation  -hx of dvt  3. Chronic B-knee/Pain Management: Prn oxycodone.   Voltaren gel for knees.  -poorly-compliant pain patient in my practice 4. Mood: Monitor as mentation improves.  5. Neuropsych: This patient is not capable of making decisions on his own behalf.   -continue   ritalin-- 6. Skin/Wound Care:   Continue air mattress overlay.   7. DM type 2: Will monitor BS with ac/hs checks. Continue Levemir bid    -reasonable control   8. Enterobacter tracheobronchitis: resolved 7. HTN/CAD:  -metoprolol bid  -continue HCTZ  -  norvasc  10mg  8. ABLA: on iron supplement.   9. Dysphagia: on d1 thins, still eating well  -off TF  -advance diet per SLP 10. Bladder-continued timed toilet  -remains incontinent   LOS (Days) 35 A FACE TO FACE EVALUATION WAS PERFORMED  Charlisa Cham T 09/03/2014, 7:06 AM

## 2014-09-03 NOTE — Progress Notes (Signed)
Speech Language Pathology Daily Session Note  Patient Details  Name: Tony Davis MRN: [id number] Date of Birth: 1961/12/08  Today's Date: 09/03/2014 SLP Individual Time: 1000-1100 SLP Individual Time Calculation (min): 60 min  Short Term Goals: Week 5: SLP Short Term Goal 1 (Week 5): Patient will utilize swallowing compensatory strategies with current diet to minimize overt s/s of aspiration with supervision multimodal cues.  SLP Short Term Goal 2 (Week 5): Patient will initiate functional tasks with Min A multimodal cues.  SLP Short Term Goal 3 (Week 5): Patient will initiate verbal response to yes/no questions with Min multimodal cues. SLP Short Term Goal 4 (Week 5): Patient will follow basic, 1-step directions during self-care tasks with supervision multimodal cues. SLP Short Term Goal 5 (Week 5): Patient will demonstrate use of call bell with Max multimodal cues. SLP Short Term Goal 6 (Week 5): Patient will sustain attention to basic self-care task for ~5 minutes with Mod multimodal cues.   Skilled Therapeutic Interventions: Skilled treatment session focused on cognitive-linguistic goals. SLP facilitated session by providing Max A multimodal cues to self-monitor and correct spelling errors during a basic written expression task of biographical information. Patient demonstrated language of confusion throughout the session which was also evident throughout during written expression tasks and required Max A multimodal cues for redirection. SLP also facilitated session by providing Min A multimodal cues for reading comprehension at the phrase and sentence level with Max A multimodal cues needed in regards to limiting the visual field to increase scanning and attention with task.  Patient also participated in a sorting task with money from a field of 4 with extra time and Mod A multimodal cues. Patient required overall Min A for verbal responses to basic questions and Mod A multimodal cues for  sustained attention throughout the session. Suspect patient's function with reading, writing and sorting tasks were impacted by suspected visual deficits.  Patient left at RN station with quick release belt in place. Continue with current plan of care.    FIM:  Comprehension Comprehension Mode: Auditory Comprehension: 3-Understands basic 50 - 74% of the time/requires cueing 25 - 50%  of the time Expression Expression Mode: Verbal Expression: 3-Expresses basic 50 - 74% of the time/requires cueing 25 - 50% of the time. Needs to repeat parts of sentences. Social Interaction Social Interaction: 3-Interacts appropriately 50 - 74% of the time - May be physically or verbally inappropriate. Problem Solving Problem Solving: 2-Solves basic 25 - 49% of the time - needs direction more than half the time to initiate, plan or complete simple activities Memory Memory: 1-Recognizes or recalls less than 25% of the time/requires cueing greater than 75% of the time  Pain Pain Assessment Pain Assessment: No/denies pain  Therapy/Group: Individual Therapy  Jullisa Grigoryan, [place] 09/03/2014, 12:14 PM

## 2014-09-03 NOTE — Plan of Care (Signed)
Problem: RH Other (Specify) Goal: RH LTG Other (Specify) Outcome: Not Applicable Date Met:  20/81/38 Goal D/C, pt able to negotiate stairs.

## 2014-09-04 ENCOUNTER — Inpatient Hospital Stay (HOSPITAL_COMMUNITY): Payer: Medicare Other | Admitting: *Deleted

## 2014-09-04 LAB — GLUCOSE, CAPILLARY
GLUCOSE-CAPILLARY: 93 mg/dL (ref 70–99)
Glucose-Capillary: 113 mg/dL — ABNORMAL HIGH (ref 70–99)
Glucose-Capillary: 78 mg/dL (ref 70–99)
Glucose-Capillary: 118 mg/dL — ABNORMAL HIGH (ref 70–99)

## 2014-09-04 NOTE — Progress Notes (Signed)
Occupational Therapy Session Note  Patient Details  Name: Tony Davis MRN: [id number] Date of Birth: [birth year]  Today's Date: 09/04/2014 OT Individual Time: 1520-1605 OT Individual Time Calculation (min): 45 min    Short Term Goals: Week 1:  OT Short Term Goal 1 (Week 1): Pt will perform dynamic sitting balance seated on EOB with Max A  in order to increase balance for ADLs. OT Short Term Goal 1 - Progress (Week 1): Met OT Short Term Goal 2 (Week 1): Pt will perform grooming with Max A in order to increase ADL performance. OT Short Term Goal 2 - Progress (Week 1): Met OT Short Term Goal 3 (Week 1): Pt will perform UB dressing with Max A in order to increase ADL performance. OT Short Term Goal 3 - Progress (Week 1): Met OT Short Term Goal 4 (Week 1): Pt will  engage in 5 minutes of functional activity before taking a rest break to increase endurance.  OT Short Term Goal 4 - Progress (Week 1): Progressing toward goal Week 2:  OT Short Term Goal 1 (Week 2): Pt will maintain sitting balance EOB for 5 mins with min A while performing functional tasks OT Short Term Goal 1 - Progress (Week 2): Met OT Short Term Goal 2 (Week 2): Pt will perform UB dressing with mod A while seated OT Short Term Goal 2 - Progress (Week 2): Met OT Short Term Goal 3 (Week 2): Pt will perform LB dressing with max A OT Short Term Goal 3 - Progress (Week 2): Met OT Short Term Goal 4 (Week 2): Pt will engage in 5 mins of functional activity before taking a rest break to increase endurance OT Short Term Goal 4 - Progress (Week 2): Met OT Short Term Goal 5 (Week 2): Pt will sustain attention for 60 seconds with min verbal cues during funcitonal tasks OT Short Term Goal 5 - Progress (Week 2): Met  Skilled Therapeutic Interventions/Progress Updates:    Addressed functional balance, mobility, RUE NMRE, sustained attention.  Pt. Sitting in wc with daughters present.  Pt. Ambulated with RW from room to gym with min  assist.  He needed frequent cues for selective attention.  He was distracted in external environment.  He was able to stand with minmal assist and engage in game of horseshoe.  HE required max assist for form and  positioning his RLE.  Pt.'s conversation was disconjugate during 90 % of session.   Daughters reported he was talking about things in past.  Pt. Followed 1 step directions about 50% of session.  He ambulated back to room with facilitation to go in right direction to room.  Left pt in recliner with safety belt on and call bell,phone within reach.    Therapy Documentation Precautions:  Precautions Precautions: Fall Precaution Comments: PEG/abdominal precautions, Bilateral thigh high TED hose  Restrictions Weight Bearing Restrictions: No      Pain:  none     See FIM for current functional status  Therapy/Group: Individual Therapy  Lisa Roca 09/04/2014, 5:29 PM

## 2014-09-04 NOTE — Progress Notes (Signed)
Patient ID: Tony Davis, male   DOB: 03/19/1961, 53 y.o.   MRN: [id number]  09/04/14.  Subjective/Complaints:   53 y/o admit for CIR with  functional deficits secondary to left Thalamic ICH, Hydrocephalus with Right  shunt w Denies any problems. Comfortable night  Review of Systems -remains limited due to cognitive status  Past Medical History  Diagnosis Date  . GERD (gastroesophageal reflux disease)   . HLD (hyperlipidemia)   . HTN (hypertension)   . Hematuria 8/12  . PFO (patent foramen ovale)     echo 8/12: EF 55-65%, grade 2 diast dysfnx; +PFO on bubble study;  TEE 4/13: EF normal, atrial septum with suspicion for interatrial septum fenestrations without flow across and few large bubbles noted in LA.  This was not felt to require closure   . CAD (coronary artery disease)     a. s/p promus DES circumflex artery 10/13/10. b. Cath 04/2013: stable disease but possible small vessel disease -Imdur added.  Marland Kitchen DVT (deep venous thrombosis) 2013    "right"   . TIA (transient ischemic attack)     "1-2; both after the one in 02/2010" (09/25/2013)  . OSA on CPAP   . Type II diabetes mellitus     June 2014 AIC 6.2%/notes 09/24/2013; pt denies this hx on 09/25/2013  . Arthritis     "knees" (09/25/2013)  . Rheumatoid arthritis(714.0)   . CVA (cerebral vascular accident) 02/2010    "lost peripheral vision when I had the stroke; some memory issues since" (09/25/2013)   Patient Vitals for the past 24 hrs:  BP Temp Temp src Pulse Resp SpO2  09/04/14 0551 131/93 mmHg 98.1 F (36.7 C) Oral 84 19 99 %  09/03/14 1959 124/74 mmHg 98.1 F (36.7 C) Oral 83 17 100 %  09/03/14 1500 109/63 mmHg 98 F (36.7 C) Oral 88 18 97 %     Intake/Output Summary (Last 24 hours) at 09/04/14 0904 Last data filed at 09/04/14 0855  Gross per 24 hour  Intake    300 ml  Output   1050 ml  Net   -750 ml    Lab Results  Component Value Date   HGBA1C 6.8* 02/11/2014    CBG (last 3)   Recent Labs   09/03/14 2024 09/03/14 2258 09/04/14 0642  GLUCAP 117* 143* 93       Objective: Vital Signs:    HEENT: neck clean. Trach site closed Cardio: RRR and no murmur Resp: CTA B/L and unlabored, clear.  GI: BS positive and PEG site clean. Extremity:  Bilateral pedal edema trace to 1+  Skin:   Intact other than above Neuro:   Initiates more but still delayed. Improved attention/awareness.    Motor 4 LUE,  3+ to 4- RLE, 3 LLE, 5/5 RUE  Musc/Skel:  Normal GEN NAD Obese    Assessment/Plan: 1. Functional deficits secondary to left Thalamic ICH, Hydrocephalus with Right  shunt which require 3+ hours per day of interdisciplinary therapy in a comprehensive inpatient rehab setting. 2. DVT Prophylaxis/Anticoagulation/has IVCF: Pharmaceutical: Lovenox-  -TEDs for edema, elevation  -hx of dvt  3. Chronic B-knee/Pain Management: Prn oxycodone.   Voltaren gel for knees.  -poorly-compliant pain patient in my practice 4. Mood: Monitor as mentation improves.  5. Neuropsych: This patient is not capable of making decisions on his own behalf.   -continue   ritalin-- 6. Skin/Wound Care:   Continue air mattress overlay.   7. DM type 2: Will monitor BS with ac/hs checks.  Continue Levemir bid    -reasonable control   8. Enterobacter tracheobronchitis: resolved 7. HTN/CAD:  -metoprolol bid  -continue HCTZ  -  norvasc  10mg  8. ABLA: on iron supplement.   9. Dysphagia: on d1 thins, still eating well  -off TF  -advance diet per SLP 10. Bladder-continued timed toilet  -remains incontinent   LOS (Days) 36 A FACE TO FACE EVALUATION WAS PERFORMED  [name] 09/04/2014, 9:02 AM

## 2014-09-05 ENCOUNTER — Inpatient Hospital Stay (HOSPITAL_COMMUNITY): Payer: Medicare Other | Admitting: *Deleted

## 2014-09-05 LAB — GLUCOSE, CAPILLARY
GLUCOSE-CAPILLARY: 104 mg/dL — AB (ref 70–99)
Glucose-Capillary: 98 mg/dL (ref 70–99)
Glucose-Capillary: 98 mg/dL (ref 70–99)
Glucose-Capillary: 91 mg/dL (ref 70–99)

## 2014-09-05 NOTE — Progress Notes (Signed)
Occupational Therapy Session Note  Patient Details  Name: ANJELO PULLMAN MRN: [id number] Date of Birth: June 04, 1961  Today's Date: 09/05/2014 OT Individual Time: 3762-8315 OT Individual Time Calculation (min): 55 min    Short Term Goals: Week 1:  OT Short Term Goal 1 (Week 1): Pt will perform dynamic sitting balance seated on EOB with Max A  in order to increase balance for ADLs. OT Short Term Goal 1 - Progress (Week 1): Met OT Short Term Goal 2 (Week 1): Pt will perform grooming with Max A in order to increase ADL performance. OT Short Term Goal 2 - Progress (Week 1): Met OT Short Term Goal 3 (Week 1): Pt will perform UB dressing with Max A in order to increase ADL performance. OT Short Term Goal 3 - Progress (Week 1): Met OT Short Term Goal 4 (Week 1): Pt will  engage in 5 minutes of functional activity before taking a rest break to increase endurance.  OT Short Term Goal 4 - Progress (Week 1): Progressing toward goal Week 2:  OT Short Term Goal 1 (Week 2): Pt will maintain sitting balance EOB for 5 mins with min A while performing functional tasks OT Short Term Goal 1 - Progress (Week 2): Met OT Short Term Goal 2 (Week 2): Pt will perform UB dressing with mod A while seated OT Short Term Goal 2 - Progress (Week 2): Met OT Short Term Goal 3 (Week 2): Pt will perform LB dressing with max A OT Short Term Goal 3 - Progress (Week 2): Met OT Short Term Goal 4 (Week 2): Pt will engage in 5 mins of functional activity before taking a rest break to increase endurance OT Short Term Goal 4 - Progress (Week 2): Met OT Short Term Goal 5 (Week 2): Pt will sustain attention for 60 seconds with min verbal cues during funcitonal tasks OT Short Term Goal 5 - Progress (Week 2): Met  Skilled Therapeutic Interventions/Progress Updates:    Focus of treatment was transfers, RUE Neuro-muscular reeducation, sitting balance, standing balance, therapeutic activities, cognitive remediation, following commands.   Pt. Sitting in wc upon OT arrival.  Sister present.  OT Propelled wc down to gym.  Engaged in standing balance at HI/LO table utilizing game of checkers.  Pt. Stood with SBA - min assist for balance.  He required increased time to respond to commands.  He needed multi cues and mod hand over hand assistance for setting up checker board.  Sister reported he use to beat her all the time.  Pt. Had blank stare during parts of the session and seemed to lose focused attention.  He attended to the items in the inferior field of vision instead of superior field.  He required multi modal cues for initiation of tasks.  Pt. Distracted by people's voices out in hall.  Pt returned to room and when another person entered he turned his attention to them.  Left pt in wc with safety belt on and call bell,phone within reach.   Sister present.   Therapy Documentation Precautions:  Precautions Precautions: Fall Precaution Comments: PEG/abdominal precautions, Bilateral thigh high TED hose  Restrictions Weight Bearing Restrictions: No   Pain:  none   See FIM for current functional status  Therapy/Group: Individual Therapy  Lisa Roca 09/05/2014, 5:06 PM

## 2014-09-05 NOTE — Progress Notes (Signed)
Patient ID: Tony Davis, male   DOB: 1961-11-01, 53 y.o.   MRN: [id number]  Patient ID: Tony Davis, male   DOB: 1961/05/24, 53 y.o.   MRN: [id number]  09/05/14.  Subjective/Complaints:   54 y/o admit for CIR with  functional deficits secondary to left Thalamic ICH, Hydrocephalus with Right  shunt  Denies any problems. Comfortable night.  Awake and alert but non verbal this am- nods head to questions.  Review of Systems -remains limited due to cognitive status  Past Medical History  Diagnosis Date  . GERD (gastroesophageal reflux disease)   . HLD (hyperlipidemia)   . HTN (hypertension)   . Hematuria 8/12  . PFO (patent foramen ovale)     echo 8/12: EF 55-65%, grade 2 diast dysfnx; +PFO on bubble study;  TEE 4/13: EF normal, atrial septum with suspicion for interatrial septum fenestrations without flow across and few large bubbles noted in LA.  This was not felt to require closure   . CAD (coronary artery disease)     a. s/p promus DES circumflex artery 10/13/10. b. Cath 04/2013: stable disease but possible small vessel disease -Imdur added.  Marland Kitchen DVT (deep venous thrombosis) 2013    "right"   . TIA (transient ischemic attack)     "1-2; both after the one in 02/2010" (09/25/2013)  . OSA on CPAP   . Type II diabetes mellitus     June 2014 AIC 6.2%/notes 09/24/2013; pt denies this hx on 09/25/2013  . Arthritis     "knees" (09/25/2013)  . Rheumatoid arthritis(714.0)   . CVA (cerebral vascular accident) 02/2010    "lost peripheral vision when I had the stroke; some memory issues since" (09/25/2013)   Patient Vitals for the past 24 hrs:  BP Temp Temp src Pulse Resp SpO2  09/05/14 0532 140/88 mmHg 97.7 F (36.5 C) Oral 84 20 100 %  09/04/14 1943 130/82 mmHg 98.9 F (37.2 C) Oral 101 20 100 %  09/04/14 1422 121/76 mmHg 98 F (36.7 C) Oral 90 18 99 %     Intake/Output Summary (Last 24 hours) at 09/05/14 0825 Last data filed at 09/05/14 0549  Gross per 24 hour  Intake    200 ml   Output   1650 ml  Net  -1450 ml    Lab Results  Component Value Date   HGBA1C 6.8* 02/11/2014    CBG (last 3)   Recent Labs  09/04/14 1643 09/04/14 2027 09/05/14 0633  GLUCAP 78 118* 98       Objective: Vital Signs:    HEENT: neck clean. Trach site closed Cardio: RRR and no murmur Resp: CTA B/L and unlabored, clear.  GI: BS positive and PEG site clean. Extremity:  Bilateral pedal edema trace to 1+  Skin:   Intact other than above Neuro:   Initiates more but still delayed. Improved attention/awareness.    Motor 4 LUE,  3+ to 4- RLE, 3 LLE, 5/5 RUE  Musc/Skel:  Normal GEN NAD Obese    Assessment/Plan: 1. Functional deficits secondary to left Thalamic ICH, Hydrocephalus with Right  shunt which require 3+ hours per day of interdisciplinary therapy in a comprehensive inpatient rehab setting. 2. DVT Prophylaxis/Anticoagulation/has IVCF: Pharmaceutical: Lovenox-  -TEDs for edema, elevation  -hx of dvt  3. Chronic B-knee/Pain Management: Prn oxycodone.   Voltaren gel for knees.  -poorly-compliant pain patient in my practice 4. Mood: Monitor as mentation improves.  5. Neuropsych: This patient is not capable of making decisions on his  own behalf.   -continue   ritalin-- 6. Skin/Wound Care:   Continue air mattress overlay.   7. DM type 2: Will monitor BS with ac/hs checks. Continue Levemir bid    -reasonable control   8. Enterobacter tracheobronchitis: resolved 7. HTN/CAD:  -metoprolol bid  -continue HCTZ  -  norvasc  10mg  8. ABLA: on iron supplement.   9. Dysphagia: on d1 thins, still eating well  -off TF  -advance diet per SLP 10. Bladder-continued timed toilet  -remains incontinent 11.  H/O hypokalemia.  Will recheck in am   LOS (Days) [place] 09/05/2014, 8:25 AM

## 2014-09-06 ENCOUNTER — Inpatient Hospital Stay (HOSPITAL_COMMUNITY): Payer: Medicare Other | Admitting: Speech Pathology

## 2014-09-06 ENCOUNTER — Inpatient Hospital Stay (HOSPITAL_COMMUNITY): Payer: Medicare Other

## 2014-09-06 ENCOUNTER — Encounter (HOSPITAL_COMMUNITY): Payer: Medicare Other

## 2014-09-06 LAB — GLUCOSE, CAPILLARY
Glucose-Capillary: 127 mg/dL — ABNORMAL HIGH (ref 70–99)
Glucose-Capillary: 121 mg/dL — ABNORMAL HIGH (ref 70–99)
Glucose-Capillary: 93 mg/dL (ref 70–99)
Glucose-Capillary: 110 mg/dL — ABNORMAL HIGH (ref 70–99)

## 2014-09-06 LAB — BASIC METABOLIC PANEL
Anion gap: 14 (ref 5–15)
BUN: 16 mg/dL (ref 6–23)
CALCIUM: 10 mg/dL (ref 8.4–10.5)
CHLORIDE: 102 meq/L (ref 96–112)
CO2: 29 meq/L (ref 19–32)
Creatinine, Ser: 0.94 mg/dL (ref 0.50–1.35)
GFR calc Af Amer: >90 mL/min (ref >90)
GFR calc non Af Amer: >90 mL/min (ref >90)
GLUCOSE: 140 mg/dL — AB (ref 70–99)
Potassium: 3.4 mEq/L — ABNORMAL LOW (ref 3.7–5.3)
SODIUM: 145 meq/L (ref 137–147)

## 2014-09-06 MED ORDER — CETYLPYRIDINIUM CHLORIDE 0.05 % MT LIQD: 7.0000 mL | Freq: Two times a day (BID) | OROMUCOSAL | Status: DC

## 2014-09-06 MED ORDER — POTASSIUM CHLORIDE CRYS ER 20 MEQ PO TBCR
20.0000 meq | EXTENDED_RELEASE_TABLET | Freq: Three times a day (TID) | ORAL | Status: DC
  Administered 2014-09-06 – 2014-09-07 (×2): 20 meq via ORAL
  Filled 2014-09-06 (×5): qty 1

## 2014-09-06 MED ORDER — SACCHAROMYCES BOULARDII 250 MG PO CAPS: 250.0000 mg | ORAL_CAPSULE | Freq: Two times a day (BID) | ORAL | Status: DC

## 2014-09-06 MED ORDER — FAMOTIDINE 20 MG PO TABS: 20.0000 mg | ORAL_TABLET | Freq: Every day | ORAL | Status: DC

## 2014-09-06 MED ORDER — FERROUS SULFATE 325 (65 FE) MG PO TABS: 325.0000 mg | ORAL_TABLET | Freq: Two times a day (BID) | ORAL | Status: DC

## 2014-09-06 MED ORDER — ATORVASTATIN CALCIUM 20 MG PO TABS: 20.0000 mg | ORAL_TABLET | Freq: Every day | ORAL | Status: DC

## 2014-09-06 MED ORDER — METHYLPHENIDATE HCL 5 MG PO TABS: 5.0000 mg | ORAL_TABLET | Freq: Two times a day (BID) | ORAL | Status: DC

## 2014-09-06 MED ORDER — DICLOFENAC SODIUM 1 % TD GEL: 2.0000 g | Freq: Four times a day (QID) | TRANSDERMAL | Status: DC

## 2014-09-06 MED ORDER — BACITRACIN ZINC 500 UNIT/GM EX OINT: TOPICAL_OINTMENT | CUTANEOUS | Status: DC | PRN

## 2014-09-06 MED ORDER — AMLODIPINE BESYLATE 5 MG PO TABS: 5.0000 mg | ORAL_TABLET | Freq: Every day | ORAL | Status: DC

## 2014-09-06 MED ORDER — HYDROCHLOROTHIAZIDE 25 MG PO TABS: 25.0000 mg | ORAL_TABLET | Freq: Every day | ORAL | Status: DC

## 2014-09-06 MED ORDER — FREE WATER: 50.0000 mL | Freq: Two times a day (BID) | Status: DC

## 2014-09-06 MED ORDER — POTASSIUM CHLORIDE CRYS ER 20 MEQ PO TBCR: 20.0000 meq | EXTENDED_RELEASE_TABLET | Freq: Three times a day (TID) | ORAL | Status: DC

## 2014-09-06 MED ORDER — ACETAMINOPHEN 325 MG PO TABS: 325.0000 mg | ORAL_TABLET | ORAL | Status: DC | PRN

## 2014-09-06 MED ORDER — INSULIN DETEMIR 100 UNIT/ML ~~LOC~~ SOLN: 20.0000 [IU] | Freq: Two times a day (BID) | SUBCUTANEOUS | Status: DC

## 2014-09-06 MED ORDER — ENSURE COMPLETE PO LIQD: 237.0000 mL | Freq: Two times a day (BID) | ORAL | Status: DC

## 2014-09-06 MED ORDER — NITROGLYCERIN 0.4 MG SL SUBL: 0.4000 mg | SUBLINGUAL_TABLET | SUBLINGUAL | Status: DC | PRN

## 2014-09-06 NOTE — Progress Notes (Signed)
Social Work Patient ID: Tony Davis, male   DOB: [birth year], 53 y.o.   MRN: [id number]   Have received SNF bed offer today from [facility] - alerted wife who has accepted bed offer and plan to transfer pt tomorrow (likely after lunch.)  Pt also agreeable.  Alerting tx team.  Lennart Pall, LCSW

## 2014-09-06 NOTE — Progress Notes (Signed)
Speech Language Pathology Weekly Progress and Session Note  Patient Details  Name: Tony Davis MRN: [id number] Date of Birth: 1961-09-26  Beginning of progress report period: August 30, 2014 End of progress report period: September 06, 2014  Today's Date: 09/06/2014 SLP Individual Time: 0900-1000 SLP Individual Time Calculation (min): 60 min  Short Term Goals: Week 5: SLP Short Term Goal 1 (Week 5): Patient will utilize swallowing compensatory strategies with current diet to minimize overt s/s of aspiration with supervision multimodal cues.  SLP Short Term Goal 1 - Progress (Week 5): Not met SLP Short Term Goal 2 (Week 5): Patient will initiate functional tasks with Min A multimodal cues.  SLP Short Term Goal 2 - Progress (Week 5): Not met SLP Short Term Goal 3 (Week 5): Patient will initiate verbal response to yes/no questions with Min multimodal cues. SLP Short Term Goal 3 - Progress (Week 5): Not met SLP Short Term Goal 4 (Week 5): Patient will follow basic, 1-step directions during self-care tasks with supervision multimodal cues. SLP Short Term Goal 4 - Progress (Week 5): Not met SLP Short Term Goal 5 (Week 5): Patient will demonstrate use of call bell with Max multimodal cues. SLP Short Term Goal 5 - Progress (Week 5): Not met SLP Short Term Goal 6 (Week 5): Patient will sustain attention to basic self-care task for ~5 minutes with Mod multimodal cues.  SLP Short Term Goal 6 - Progress (Week 5): Met    New Short Term Goals: Week 6: SLP Short Term Goal 1 (Week 6): Patient will utilize swallowing compensatory strategies with current diet to minimize overt s/s of aspiration with supervision multimodal cues.  SLP Short Term Goal 2 (Week 6): Patient will initiate functional tasks with Min A multimodal cues.  SLP Short Term Goal 3 (Week 6): Patient will initiate verbal response to yes/no questions with Min multimodal cues. SLP Short Term Goal 4 (Week 6): Patient will follow  basic, 1-step directions during self-care tasks with supervision multimodal cues. SLP Short Term Goal 5 (Week 6): Patient will demonstrate use of call bell with Max multimodal cues. SLP Short Term Goal 6 (Week 6): Patient will sustain attention to basic self-care task for ~30 minutes with Mod multimodal cues.   Weekly Progress Updates: Patient has made minimal but functional gains and has met 1 of 6 short term goals this reporting period due to increased sustained attention.  Currently, patient requires Mod assist for initiation of functional tasks and following 1-step commands and Max A for verbal expression of wants/needs.   Patient also continues to require total A for orientation, working memory, functional problem solving, safety awareness and intellectual awareness of deficits. Patient is currently consuming Dys. 2 textures with thin liquids without overt s/s of aspiration and requires Min-Mod A multimodal cues for utilization of swallowing compensatory strategies. Patient would benefit from continued skilled SLP intervention to maximize his cognitive-linguistic and swallowing function in order to maximize his functional independence prior to discharge. Continue with current plan of care.   Intensity: Minumum of 1-2 x/day, 30 to 90 minutes Frequency: 5 out of 7 days Duration/Length of Stay: TBD due to SNF placement  Treatment/Interventions: Cognitive remediation/compensation;Speech/Language facilitation;Internal/external aids;Patient/family education;Dysphagia/aspiration precaution training;Functional tasks;Cueing hierarchy   Daily Session Skilled Therapeutic Interventions: Skilled treatment session focused on dysphagia and cognitive-linguistic goals. SLP facilitated session by providing supervision verbal cues for initaition of self-feeding and attention to task for ~25 minutes. Patient without overt s/s of aspiration and required Mod verbal cues for  utilization of small bites/sips throughout  the meal. Patient continues to require Max A multimodal cues for initiation of verbal expression of wants/needs and for orientation to time, place and situation. This clinician utilized an external memory aid of a timeline of hospital events to increase recall and orientation. Patient was able to independently orally read the sign aloud but required total A multimodal cues to utilize for recall. Continue with current plan of care.      FIM:  Comprehension Comprehension Mode: Auditory Comprehension: 3-Understands basic 50 - 74% of the time/requires cueing 25 - 50%  of the time Expression Expression Mode: Verbal Expression: 3-Expresses basic 50 - 74% of the time/requires cueing 25 - 50% of the time. Needs to repeat parts of sentences. Social Interaction Social Interaction: 3-Interacts appropriately 50 - 74% of the time - May be physically or verbally inappropriate. Problem Solving Problem Solving: 2-Solves basic 25 - 49% of the time - needs direction more than half the time to initiate, plan or complete simple activities Memory Memory: 2-Recognizes or recalls 25 - 49% of the time/requires cueing 51 - 75% of the time FIM - Eating Eating Activity: 5: Needs verbal cues/supervision Pain No/Denies Pain  Therapy/Group: Individual Therapy  Sanjana Folz, [place] 09/06/2014, 3:18 PM

## 2014-09-06 NOTE — Progress Notes (Signed)
Occupational Therapy Session Note  Patient Details  Name: Tony Davis MRN: [id number] Date of Birth: [birth year]  Today's Date: 09/06/2014 OT Individual Time:  -       Short Term Goals: Week 5:  OT Short Term Goal 1 (Week 5): Pt will perform toileting tasks with mod A OT Short Term Goal 2 (Week 5): Pt will maintain dynamic sitting balance during ADLs with min A  Skilled Therapeutic Interventions/Progress Updates:    Pt engaged in BADL retraining including bathing at shower level and dressing with sit<>stand from w/c.  Pt required max multimodal cues and extra time to initiate all transitional movements this morning.  Pt assisted therapist this morning with donning brief.  Pt required min A for sit<>stand primarily with initiating task.  Pt continues to require extra time to complete tasks and mod verbal cues for redirection to task.  Focus on activity tolerance, task initiation, attention to task, sit<>stand, standing balance, transfers, and safety awareness. Therapy Documentation Precautions:  Precautions Precautions: Fall Precaution Comments: PEG/abdominal precautions, Bilateral thigh high TED hose  Restrictions Weight Bearing Restrictions: No Pain: Pain Assessment Pain Assessment: Faces Faces Pain Scale: Hurts a little bit Pain Type: Chronic pain Pain Location: Shoulder Pain Orientation: Right Pain Descriptors / Indicators: Aching Pain Onset: With Activity Pain Intervention(s): RN made aware  See FIM for current functional status  Therapy/Group: Individual Therapy  Leroy Libman 09/06/2014, 8:57 AM

## 2014-09-06 NOTE — Progress Notes (Signed)
Subjective/Complaints: Uneventful weekend.  Review of Systems -remains limited due to cognitive status   Objective: Vital Signs: Blood pressure 135/83, pulse 86, temperature 97.9 F (36.6 C), temperature source Oral, resp. rate 20, weight 117.346 kg (258 lb 11.2 oz), SpO2 99.00%. No results found. Results for orders placed during the hospital encounter of 07/30/14 (from the past 72 hour(s))  CBC     Status: None   Collection Time    09/03/14  8:53 AM      Result Value Ref Range   WBC 6.6  4.0 - 10.5 K/uL   RBC 4.62  4.22 - 5.81 MIL/uL   Hemoglobin 13.2  13.0 - 17.0 g/dL   HCT 41.2  39.0 - 52.0 %   MCV 89.2  78.0 - 100.0 fL   MCH 28.6  26.0 - 34.0 pg   MCHC 32.0  30.0 - 36.0 g/dL   RDW 13.8  11.5 - 15.5 %   Platelets 304  150 - 400 K/uL  BASIC METABOLIC PANEL     Status: Abnormal   Collection Time    09/03/14  8:53 AM      Result Value Ref Range   Sodium 143  137 - 147 mEq/L   Potassium 3.0 (*) 3.7 - 5.3 mEq/L   Chloride 98  96 - 112 mEq/L   CO2 31  19 - 32 mEq/L   Glucose, Bld 113 (*) 70 - 99 mg/dL   BUN 11  6 - 23 mg/dL   Creatinine, Ser 1.09  0.50 - 1.35 mg/dL   Calcium 10.3  8.4 - 10.5 mg/dL   GFR calc non Af Amer 77 (*) >90 mL/min   GFR calc Af Amer 90 (*) >90 mL/min   Comment: (NOTE)     The eGFR has been calculated using the CKD EPI equation.     This calculation has not been validated in all clinical situations.     eGFR's persistently <90 mL/min signify possible Chronic Kidney     Disease.   Anion gap 14  5 - 15  GLUCOSE, CAPILLARY     Status: None   Collection Time    09/03/14 11:43 AM      Result Value Ref Range   Glucose-Capillary 99  70 - 99 mg/dL  GLUCOSE, CAPILLARY     Status: Abnormal   Collection Time    09/03/14  4:59 PM      Result Value Ref Range   Glucose-Capillary 114 (*) 70 - 99 mg/dL  GLUCOSE, CAPILLARY     Status: Abnormal   Collection Time    09/03/14  8:24 PM      Result Value Ref Range   Glucose-Capillary 117 (*) 70 - 99 mg/dL   GLUCOSE, CAPILLARY     Status: Abnormal   Collection Time    09/03/14 10:58 PM      Result Value Ref Range   Glucose-Capillary 143 (*) 70 - 99 mg/dL  GLUCOSE, CAPILLARY     Status: None   Collection Time    09/04/14  6:42 AM      Result Value Ref Range   Glucose-Capillary 93  70 - 99 mg/dL  GLUCOSE, CAPILLARY     Status: Abnormal   Collection Time    09/04/14 11:26 AM      Result Value Ref Range   Glucose-Capillary 113 (*) 70 - 99 mg/dL  GLUCOSE, CAPILLARY     Status: None   Collection Time    09/04/14  4:43 PM  Result Value Ref Range   Glucose-Capillary 78  70 - 99 mg/dL   Comment 1 Notify RN    GLUCOSE, CAPILLARY     Status: Abnormal   Collection Time    09/04/14  8:27 PM      Result Value Ref Range   Glucose-Capillary 118 (*) 70 - 99 mg/dL   Comment 1 Notify RN    GLUCOSE, CAPILLARY     Status: None   Collection Time    09/05/14  6:33 AM      Result Value Ref Range   Glucose-Capillary 98  70 - 99 mg/dL   Comment 1 Notify RN    GLUCOSE, CAPILLARY     Status: None   Collection Time    09/05/14 12:15 PM      Result Value Ref Range   Glucose-Capillary 98  70 - 99 mg/dL  GLUCOSE, CAPILLARY     Status: None   Collection Time    09/05/14  4:50 PM      Result Value Ref Range   Glucose-Capillary 91  70 - 99 mg/dL  GLUCOSE, CAPILLARY     Status: Abnormal   Collection Time    09/05/14  8:38 PM      Result Value Ref Range   Glucose-Capillary 104 (*) 70 - 99 mg/dL   Comment 1 Notify RN    GLUCOSE, CAPILLARY     Status: Abnormal   Collection Time    09/06/14  6:33 AM      Result Value Ref Range   Glucose-Capillary 127 (*) 70 - 99 mg/dL   Comment 1 Notify RN       HEENT: neck clean. Trach site closed Cardio: RRR and no murmur Resp: CTA B/L and unlabored, clear.  GI: BS positive and PEG site clean. Extremity:  Bilateral pedal edema trace to 1+  Skin:   Intact other than above Neuro:   Initiates more but still delayed. Improved attention/awareness.    Motor 4  LUE,  3+ to 4- RLE, 3 LLE, 5/5 RUE  Musc/Skel:  Normal GEN NAD   Assessment/Plan: 1. Functional deficits secondary to left Thalamic ICH, Hydrocephalus with Right  shunt which require 3+ hours per day of interdisciplinary therapy in a comprehensive inpatient rehab setting. Physiatrist is providing close team supervision and 24 hour management of active medical problems listed below. Physiatrist and rehab team continue to assess barriers to discharge/monitor patient progress toward functional and medical goals.  FIM: FIM - Bathing Bathing Steps Patient Completed: Chest;Right Arm;Left Arm;Abdomen;Front perineal area;Right upper leg;Left upper leg;Right lower leg (including foot);Left lower leg (including foot) Bathing: 4: Min-Patient completes 8-9 1f10 parts or 75+ percent  FIM - Upper Body Dressing/Undressing Upper body dressing/undressing steps patient completed: Thread/unthread right sleeve of pullover shirt/dresss;Thread/unthread left sleeve of pullover shirt/dress;Put head through opening of pull over shirt/dress;Pull shirt over trunk Upper body dressing/undressing: 5: Supervision: Safety issues/verbal cues FIM - Lower Body Dressing/Undressing Lower body dressing/undressing steps patient completed: Thread/unthread right underwear leg;Thread/unthread left underwear leg;Pull underwear up/down;Thread/unthread right pants leg;Thread/unthread left pants leg;Pull pants up/down Lower body dressing/undressing: 3: Mod-Patient completed 50-74% of tasks  FIM - Toileting Toileting steps completed by patient: Adjust clothing prior to toileting Toileting Assistive Devices: Grab bar or rail for support Toileting: 2: Max-Patient completed 1 of 3 steps  FIM - TRadio producerDevices: Grab bars Toilet Transfers: 4-From toilet/BSC: Min A (steadying Pt. > 75%);4-To toilet/BSC: Min A (steadying Pt. > 75%)  FIM - Bed/Chair Transfer  Bed/Chair Transfer Assistive Devices: Arm  rests;Walker Bed/Chair Transfer: 4: Bed > Chair or W/C: Min A (steadying Pt. > 75%);3: Chair or W/C > Bed: Mod A (lift or lower assist)  FIM - Locomotion: Wheelchair Distance: 100 Locomotion: Wheelchair: 0: Activity did not occur FIM - Locomotion: Ambulation Locomotion: Ambulation Assistive Devices: Administrator Ambulation/Gait Assistance: 5: Supervision;4: Min guard Locomotion: Ambulation: 4: Travels 150 ft or more with minimal assistance (Pt.>75%)  Comprehension Comprehension Mode: Auditory Comprehension: 3-Understands basic 50 - 74% of the time/requires cueing 25 - 50%  of the time  Expression Expression Mode: Verbal Expression Assistive Devices: 6-Talk trach valve Expression: 3-Expresses basic 50 - 74% of the time/requires cueing 25 - 50% of the time. Needs to repeat parts of sentences.  Social Interaction Social Interaction Mode: Asleep Social Interaction: 3-Interacts appropriately 50 - 74% of the time - May be physically or verbally inappropriate.  Problem Solving Problem Solving Mode: Asleep Problem Solving: 3-Solves basic 50 - 74% of the time/requires cueing 25 - 49% of the time  Memory Memory Mode: Asleep Memory: 3-Recognizes or recalls 50 - 74% of the time/requires cueing 25 - 49% of the time  Medical Problem List and Plan:  1. Functional deficits secondary to Left thalamic bleed with hydrocephalus.  2. DVT Prophylaxis/Anticoagulation/has IVCF: Pharmaceutical: Lovenox-  -TEDs for edema, elevation  -hx of dvt  3. Chronic B-knee/Pain Management: Prn oxycodone.   Voltaren gel for knees.  -poorly-compliant pain patient in my practice 4. Mood: Monitor as mentation improves.  5. Neuropsych: This patient is not capable of making decisions on his own behalf.   -continue   ritalin-- 6. Skin/Wound Care:   Continue air mattress overlay.   7. DM type 2: Will monitor BS with ac/hs checks. Continue Levemir bid    -reasonable control   8. Enterobacter tracheobronchitis:  resolved 7. HTN/CAD:  -metoprolol bid  -continue HCTZ  -  norvasc  27m 8. ABLA: on iron supplement.   9. Dysphagia: on d1 thins, still eating well  -off TF  -advance diet per SLP 10. Bladder-continued timed toilet  -remains incontinent despite attempts at timed voiding/cueing   LOS (Days) 38 A FACE TO FACE EVALUATION WAS PERFORMED  Dorthey Depace T 09/06/2014, 7:58 AM

## 2014-09-06 NOTE — Progress Notes (Signed)
Physical Therapy Session Note  Patient Details  Name: Tony Davis MRN: [id number] Date of Birth: 12-Jun-1961  Today's Date: 09/06/2014 PT Individual Time: 1430-1530 PT Individual Time Calculation (min): 60 min   Short Term Goals: Week 5:  PT Short Term Goal 1 (Week 5): STGs-LTGs due to anticipated LOS  Skilled Therapeutic Interventions/Progress Updates:  1:1. Pt received supine in bed, ready for therapy with friends in room. Focus this session on cognitive remediation, functional endurance and safety during functional mobility. Pt req total cues for orientation and mod cues for initiation during session. Pt req supervision for t/f sup>sit EOB and min A for squat pivot t/f bed>w/c and all t/f sit<>stand throughout session, including from various pieces of furniture in the therapy apartment. Pt req mod cues to maintain selective attention to w/c propulsion 100'x1 w/ B UE and supervision in busy hallway as well as during ambulation 150-180'x3 w/ RW and supervision-min guard A. Pt able to safely negotiate up/down 4 steps w/ B UE on single R rail and min guard A, automatically choosing step-to pattern. Pt with good selective attention to 3 rounds of "Connect 4" in mildly busy therapy apartment to target problem solving. Pt left sitting in recliner at nurses station w/ all needs in reach, quick release belt in place.   Therapy Documentation Precautions:  Precautions Precautions: Fall Precaution Comments: PEG/abdominal precautions, Bilateral thigh high TED hose  Restrictions Weight Bearing Restrictions: No  See FIM for current functional status  Therapy/Group: Individual Therapy  Gilmore Laroche 09/06/2014, 5:21 PM

## 2014-09-06 NOTE — Discharge Summary (Signed)
Physician Discharge Summary  Patient ID: Tony Davis MRN: [id number] DOB/AGE: November 26, 1961 53 y.o.  Admit date: 07/30/2014 Discharge date: 09/07/2014  Discharge Diagnoses:  Principal Problem:   Basal ganglia hemorrhage Active Problems:   Type II or unspecified type diabetes mellitus without mention of complication, uncontrolled   DVT (deep venous thrombosis)   Osteoarthritis of left knee   BPH associated with nocturia   Hypokalemia   Discharged Condition: Stable  Significant Diagnostic Studies:  Labs:  Basic Metabolic Panel:  Recent Labs Lab 09/03/14 0853 09/06/14 0717  NA 143 145  K 3.0* 3.4*  CL 98 102  CO2 31 29  GLUCOSE 113* 140*  BUN 11 16  CREATININE 1.09 0.94  CALCIUM 10.3 10.0    CBC:  Recent Labs Lab 09/03/14 0853  WBC 6.6  HGB 13.2  HCT 41.2  MCV 89.2  PLT 304    CBG:  Recent Labs Lab 09/06/14 1117 09/06/14 1640 09/06/14 2104 09/07/14 0744 09/07/14 1149  GLUCAP 121* 93 110* 100* 115*    Brief HPI:   [name] is a 53 year old male with h/o DM type 2, CAD, OSA, chronic pain bilateral knees, CVA '11, PFO-on chronic coumadin who was admitted to OSH on 06/13/14 with left basal ganglia hemorrhage with intraventricular extension and hydrocephalus requiring right crani for evacuation as well as VP shunt placement Hospital course complicated by acute respiratory failure requiring tracheostomy as well as PEG placement, BLE DVT requiring IVC placement as well as fevers due to enterobacter PNA. Patient with subsequent right hemiparesis, lethargy, nonverbal, requiring noxious stimuli for arousal, difficulty tracking, dysconjugate gaze as well as difficulty following commands. He was transferred to [facility] on 07/08/14 for vent wean and rehab. He was maintained on meropenum for HCAP and trach downsized to CFS # 6 and PMSV trials ongoing. He was placed on dysphagia 2 diet with thin liquids with poor po intake therefore supplemental TF ongoing at nights.  Therapy ongoing and CIR recommended by rehab team and MD.    Hospital Course: Tony Davis was admitted to rehab 07/30/2014 for inpatient therapies to consist of PT, ST and OT at least three hours five days a week. Past admission physiatrist, therapy team and rehab RN have worked together to provide customized collaborative inpatient rehab. He completed 10 day course for enterobacter tracheobronchitis and tolerated decannulation without hypoxia. BLE edema has been managed with support stockings as well as elevation. He was maintained on iron supplement for ABLA. Recheck on 09/25 shows resolution therefore iron supplement was decreased to daily. Blood pressures have been monitored on tid basis and have been reasonably controlled. Renal status is stable with improvement in po intake therefore tube feeds were discontinued. He has had hypokalemia due to HCTZ and K dur was increased to tid for supplementation.  Diabetes has been monitored with ac/hs checks and blood sugars have improved off tube feeds. Levemir was reduced to 20 units bid with SSI used for BS management. PEG site is clean and dry. Voltaren gel was added to help with chronic bilateral knee pain. No complaints of discomfort reported with increase in mobility. He continues to have delayed initiation, cognitive impairments as well as left hemiparesis. He requires min to moderate assist and family has elected on SNF for further therapies. On 09/07/14, he was discharged to [facility] in improved condition.    Rehab course: During patient's stay in rehab weekly team conferences were held to monitor patient's progress, set goals and discuss barriers to  discharge. Patient has had improvement in activity tolerance, balance, as well as ambulation.  He requires supervision with min assist for bed mobility, min to mod assist with transfers & standing balance and close supervision to min assist for ambulating > 150 feet with RW.  He requires min to mod  assist for ADL tasks with max multimodal cues. He  requires Mod assist for initiation of functional tasks, to follow 1-step commands as well as max assist for expression of wants/needs. He continues to require total A for orientation, working memory, functional problem solving, safety awareness and intellectual awareness of deficits. Patient is tolerating dysphagia 2 textures with thin liquids and requires Min-Mod A multimodal cues for utilization of swallowing compensatory strategies    Disposition:  [place].   Diet: Dysphagia 2. Full supervision at meals and limit distractions.   Special Instructions: 1. Medications crushed in puree. Small bites/sips.  2. Recheck BMET the next 1-2 days.      Medication List    STOP taking these medications       heparin 5000 UNIT/ML injection     pantoprazole 40 MG tablet  Commonly known as:  PROTONIX      TAKE these medications       acetaminophen 325 MG tablet  Commonly known as:  TYLENOL  Take 1-2 tablets (325-650 mg total) by mouth every 4 (four) hours as needed for mild pain.     amLODipine 5 MG tablet  Commonly known as:  NORVASC  Take 1 tablet (5 mg total) by mouth daily.     antiseptic oral rinse 0.05 % Liqd solution  Commonly known as:  CPC / CETYLPYRIDINIUM CHLORIDE 0.05%  7 mLs by Mouth Rinse route 2 times daily at 12 noon and 4 pm.     atorvastatin 20 MG tablet  Commonly known as:  LIPITOR  Take 1 tablet (20 mg total) by mouth daily at 6 PM.     bacitracin ointment  Apply topically as needed for wound care.     chlorhexidine 0.12 % solution  Commonly known as:  PERIDEX  Use as directed 15 mLs in the mouth or throat 2 (two) times daily.     diclofenac sodium 1 % Gel  Commonly known as:  VOLTAREN  Apply 2 g topically 4 (four) times daily.     famotidine 20 MG tablet  Commonly known as:  PEPCID  Take 1 tablet (20 mg total) by mouth daily.     feeding supplement (ENSURE COMPLETE) Liqd  Take 237 mLs  by mouth 2 (two) times daily.     ferrous sulfate 325 (65 FE) MG tablet  Take 1 tablet (325 mg total) by mouth daily with breakfast.     fluticasone 50 MCG/ACT nasal spray  Commonly known as:  FLONASE  Place 2 sprays into both nostrils 2 (two) times daily.     free water Soln  Place 50 mLs into feeding tube 2 times daily at 12 noon and 4 pm.     hydrochlorothiazide 25 MG tablet  Commonly known as:  HYDRODIURIL  Take 1 tablet (25 mg total) by mouth daily.     insulin detemir 100 UNIT/ML injection  Commonly known as:  LEVEMIR  Inject 0.2 mLs (20 Units total) into the skin 2 (two) times daily.     methylphenidate 5 MG tablet--Rx 10 pills  Commonly known as:  RITALIN  Place 1 tablet (5 mg total) into feeding tube 2 (two) times daily.  metoprolol 50 MG tablet  Commonly known as:  LOPRESSOR  Take 75 mg by mouth 2 (two) times daily.     nitroGLYCERIN 0.4 MG SL tablet  Commonly known as:  NITROSTAT  Place 1 tablet (0.4 mg total) under the tongue every 5 (five) minutes as needed for chest pain.     potassium chloride SA 20 MEQ tablet  Commonly known as:  K-DUR,KLOR-CON  Take 1 tablet (20 mEq total) by mouth 3 (three) times daily.     saccharomyces boulardii 250 MG capsule  Commonly known as:  FLORASTOR  Take 1 capsule (250 mg total) by mouth 2 (two) times daily.       Follow-up Information   Follow up with Meredith Staggers, MD On 10/06/2014. (Be there at  12 noon  for  12:20 pm  appointment)    Specialty:  Physical Medicine and Rehabilitation   Contact information:   [address], [place] [address] [phone]       Signed: Bary Leriche 09/07/2014, 12:21 PM

## 2014-09-06 NOTE — Progress Notes (Signed)
Patient refused lunch time blood sugar check.

## 2014-09-07 ENCOUNTER — Inpatient Hospital Stay (HOSPITAL_COMMUNITY): Payer: Medicare Other

## 2014-09-07 ENCOUNTER — Inpatient Hospital Stay (HOSPITAL_COMMUNITY): Payer: Medicare Other | Admitting: Speech Pathology

## 2014-09-07 ENCOUNTER — Encounter (HOSPITAL_COMMUNITY): Payer: Medicare Other

## 2014-09-07 DIAGNOSIS — E119 Type 2 diabetes mellitus without complications: Secondary | ICD-10-CM | POA: Diagnosis not present

## 2014-09-07 DIAGNOSIS — IMO0001 Reserved for inherently not codable concepts without codable children: Secondary | ICD-10-CM | POA: Diagnosis not present

## 2014-09-07 DIAGNOSIS — M6281 Muscle weakness (generalized): Secondary | ICD-10-CM | POA: Diagnosis not present

## 2014-09-07 DIAGNOSIS — E876 Hypokalemia: Secondary | ICD-10-CM

## 2014-09-07 DIAGNOSIS — M1712 Unilateral primary osteoarthritis, left knee: Secondary | ICD-10-CM | POA: Diagnosis not present

## 2014-09-07 DIAGNOSIS — R1312 Dysphagia, oropharyngeal phase: Secondary | ICD-10-CM | POA: Diagnosis not present

## 2014-09-07 DIAGNOSIS — I69151 Hemiplegia and hemiparesis following nontraumatic intracerebral hemorrhage affecting right dominant side: Secondary | ICD-10-CM | POA: Diagnosis not present

## 2014-09-07 DIAGNOSIS — K5900 Constipation, unspecified: Secondary | ICD-10-CM | POA: Diagnosis not present

## 2014-09-07 DIAGNOSIS — I270 Primary pulmonary hypertension: Secondary | ICD-10-CM | POA: Diagnosis not present

## 2014-09-07 DIAGNOSIS — D529 Folate deficiency anemia, unspecified: Secondary | ICD-10-CM | POA: Diagnosis not present

## 2014-09-07 DIAGNOSIS — I619 Nontraumatic intracerebral hemorrhage, unspecified: Secondary | ICD-10-CM | POA: Diagnosis not present

## 2014-09-07 DIAGNOSIS — IMO0002 Reserved for concepts with insufficient information to code with codable children: Secondary | ICD-10-CM | POA: Diagnosis not present

## 2014-09-07 DIAGNOSIS — I69920 Aphasia following unspecified cerebrovascular disease: Secondary | ICD-10-CM | POA: Diagnosis not present

## 2014-09-07 DIAGNOSIS — I10 Essential (primary) hypertension: Secondary | ICD-10-CM | POA: Diagnosis not present

## 2014-09-07 DIAGNOSIS — R269 Unspecified abnormalities of gait and mobility: Secondary | ICD-10-CM | POA: Diagnosis not present

## 2014-09-07 DIAGNOSIS — D649 Anemia, unspecified: Secondary | ICD-10-CM | POA: Diagnosis not present

## 2014-09-07 DIAGNOSIS — E118 Type 2 diabetes mellitus with unspecified complications: Secondary | ICD-10-CM | POA: Diagnosis not present

## 2014-09-07 DIAGNOSIS — N400 Enlarged prostate without lower urinary tract symptoms: Secondary | ICD-10-CM | POA: Diagnosis not present

## 2014-09-07 DIAGNOSIS — R488 Other symbolic dysfunctions: Secondary | ICD-10-CM | POA: Diagnosis not present

## 2014-09-07 DIAGNOSIS — M1710 Unilateral primary osteoarthritis, unspecified knee: Secondary | ICD-10-CM | POA: Diagnosis not present

## 2014-09-07 DIAGNOSIS — I82409 Acute embolism and thrombosis of unspecified deep veins of unspecified lower extremity: Secondary | ICD-10-CM | POA: Diagnosis not present

## 2014-09-07 DIAGNOSIS — I2510 Atherosclerotic heart disease of native coronary artery without angina pectoris: Secondary | ICD-10-CM | POA: Diagnosis not present

## 2014-09-07 DIAGNOSIS — Z9889 Other specified postprocedural states: Secondary | ICD-10-CM | POA: Diagnosis not present

## 2014-09-07 DIAGNOSIS — Z5189 Encounter for other specified aftercare: Secondary | ICD-10-CM | POA: Diagnosis not present

## 2014-09-07 DIAGNOSIS — F329 Major depressive disorder, single episode, unspecified: Secondary | ICD-10-CM | POA: Diagnosis not present

## 2014-09-07 DIAGNOSIS — I69391 Dysphagia following cerebral infarction: Secondary | ICD-10-CM | POA: Diagnosis not present

## 2014-09-07 DIAGNOSIS — E785 Hyperlipidemia, unspecified: Secondary | ICD-10-CM | POA: Diagnosis not present

## 2014-09-07 DIAGNOSIS — I6990 Unspecified sequelae of unspecified cerebrovascular disease: Secondary | ICD-10-CM | POA: Diagnosis not present

## 2014-09-07 DIAGNOSIS — Z23 Encounter for immunization: Secondary | ICD-10-CM | POA: Diagnosis not present

## 2014-09-07 LAB — GLUCOSE, CAPILLARY
GLUCOSE-CAPILLARY: 115 mg/dL — AB (ref 70–99)
Glucose-Capillary: 100 mg/dL — ABNORMAL HIGH (ref 70–99)

## 2014-09-07 MED ORDER — FERROUS SULFATE 325 (65 FE) MG PO TABS: 325.0000 mg | ORAL_TABLET | Freq: Every day | ORAL | Status: DC

## 2014-09-07 NOTE — Progress Notes (Addendum)
Physical Therapy Discharge Summary  Patient Details  Name: Tony Davis MRN: [id number] Date of Birth: 12-31-1960  Today's Date: 09/07/2014 PT Individual Time: 1110-1150 PT Individual Time Calculation (min): 40 min    Patient has met 7 of 12 long term goals due to improved activity tolerance, improved balance, improved postural control, increased strength, ability to compensate for deficits, functional use of  right upper extremity, right lower extremity, left upper extremity and left lower extremity, improved attention, improved awareness and improved coordination.  Patient to discharge at combination of wheelchair and ambulatory level with overall supervision-min A and mod-max cues for cognition.  Patient's family is unable to provide the recommended 24hr supervision-assist for the remaining physical and cognitive impairments, pt is therefore d/c to SNF.  Reasons goals not met: Pt continues to req max cues for awareness and memory. Pt able to inconsistently demonstrate ambulation and transfers at target level of supervision, requiring occasional min A for increased overall safety.   Recommendation:  Patient will benefit from ongoing skilled PT services in skilled nursing facility setting to continue to advance safe functional mobility, address ongoing impairments in: decreased functional endurance, decreased strength, decreased balance, decreased orientation, decreased initiation, decreased memory, decreased problem solving, decreased awareness, decreased attention, decreased overall functional transfers and mobility and minimize fall risk.  Equipment: No equipment provided  Reasons for discharge: treatment goals met and discharge from hospital  Patient/family agrees with progress made and goals achieved: Yes  Skilled Therapeutic Intervention 1:1. Pt received sitting in w/c at nurses station, ready for therapy. Focus this session on cognitive remediation and functional endurance.  Overall, pt req total A for orientation with mod cues for initiation. Pt with excellent tolerance to w/c propulsion 175'x1 with B UE, but mod cues to maintain selective attention to task to maintain slow, but steady pace. Pt req supervision-occasional min A for all t/f sit<>stand and can req >2 attempts before success, especially from lower height surfaces. Pt able to t/f sup<>sit EOB with supervision on a standard bed. Pt demonstrates overall slow, but steady pace during ambulation 200'x1 and 150'x1 with supervision 80% of time. Pt benefits from occasional min A during obstacle negotiation. Pt able to safely negotiation up/down 5 steps w/ B UE on single R rail and min guard A. Pt left sitting in w/c at nurses station at end of session with quick release belt in place and RN at side.   PT Discharge Precautions/Restrictions Precautions Precaution Comments: PEG/abdominal precautions, Bilateral thigh high TED hose  Restrictions Weight Bearing Restrictions: No Vision/Perception  See OT discharge Cognition Overall Cognitive Status: Impaired/Different from baseline Arousal/Alertness: Awake/alert Orientation Level: Oriented to place;Oriented to person Attention: Sustained;Selective Focused Attention: Appears intact Sustained Attention: Impaired Sustained Attention Impairment: Verbal basic;Functional basic Selective Attention: Impaired Selective Attention Impairment: Verbal basic;Functional basic Memory: Impaired Memory Impairment: Retrieval deficit;Storage deficit;Decreased short term memory Decreased Short Term Memory: Verbal basic;Functional basic Awareness: Impaired Awareness Impairment: Intellectual impairment Problem Solving: Impaired Problem Solving Impairment: Verbal basic;Functional basic Executive Function: Initiating (all impaired due to lower level deficits) Initiating: Impaired Initiating Impairment: Verbal basic;Functional basic Safety/Judgment:  Impaired Sensation Sensation Light Touch: Impaired by gross assessment Proprioception: Impaired by gross assessment Coordination Gross Motor Movements are Fluid and Coordinated: Yes Fine Motor Movements are Fluid and Coordinated: No Coordination and Movement Description: Movements overall very slow Motor  Motor Motor: Other (comment) Motor - Discharge Observations: Generalized weakness  Mobility Bed Mobility Bed Mobility: Supine to Sit;Sit to Supine Supine to Sit: 5: Supervision Supine to  Sit Details: Tactile cues for initiation Sit to Supine: 5: Supervision Sit to Supine - Details: Tactile cues for initiation Transfers Transfers: Yes Sit to Stand: 5: Supervision;4: Min assist;4: Min guard Sit to Stand Details: Tactile cues for initiation;Verbal cues for precautions/safety;Verbal cues for sequencing Stand to Sit: 5: Supervision;4: Min assist;4: Min guard Stand to Sit Details (indicate cue type and reason): Tactile cues for initiation;Verbal cues for sequencing;Verbal cues for precautions/safety Stand Pivot Transfers: 5: Supervision;4: Min assist;4: Min guard Stand Pivot Transfer Details: Tactile cues for initiation;Verbal cues for safe use of DME/AE;Verbal cues for sequencing Locomotion  Ambulation Ambulation: Yes Ambulation/Gait Assistance: 5: Supervision;4: Min guard Ambulation Distance (Feet): 200 Feet Assistive device: Rolling walker Ambulation/Gait Assistance Details: Verbal cues for safe use of DME/AE Ambulation/Gait Assistance Details: tactile cues for pace, min A for management of RW during turns Gait Gait: Yes Gait Pattern: Impaired Gait Pattern: Step-through pattern;Antalgic (antalgic pain noted with prolonged ambulation due to chronic B knee pain, supination of L foot) Stairs / Additional Locomotion Stairs: Yes Stairs Assistance: 4: Min guard Stair Management Technique: One rail Right;Step to pattern;Sideways Number of Stairs: 5 Wheelchair Mobility Wheelchair  Mobility: Yes Wheelchair Assistance: 5: Supervision Wheelchair Assistance Details: Other (comment);Verbal cues for technique;Tactile cues for placement Wheelchair Propulsion: Both upper extremities Wheelchair Parts Management: Needs assistance Distance: 175'  Trunk/Postural Assessment  Cervical Assessment Cervical Assessment: Within Functional Limits Thoracic Assessment Thoracic Assessment: Within Functional Limits Lumbar Assessment Lumbar Assessment: Within Functional Limits Postural Control Postural Control: Deficits on evaluation Righting Reactions: significantly delayed in standing  Balance Balance Balance Assessed: Yes Static Sitting Balance Static Sitting - Balance Support: Feet supported;No upper extremity supported Static Sitting - Level of Assistance: 5: Stand by assistance Dynamic Sitting Balance Dynamic Sitting - Balance Support: Feet unsupported;Left upper extremity supported;Right upper extremity supported Dynamic Sitting - Level of Assistance: 5: Stand by assistance Dynamic Sitting - Balance Activities: Lateral lean/weight shifting;Forward lean/weight shifting;Reaching across midline Static Standing Balance Static Standing - Balance Support: Bilateral upper extremity supported Static Standing - Level of Assistance: 5: Stand by assistance Dynamic Standing Balance Dynamic Standing - Balance Support: Bilateral upper extremity supported;Left upper extremity supported;Right upper extremity supported Dynamic Standing - Level of Assistance: 5: Stand by assistance Dynamic Standing - Balance Activities: Forward lean/weight shifting;Lateral lean/weight shifting;Reaching for weighted objects Extremity Assessment  RUE Assessment RUE Assessment: Exceptions to [facility] RUE Strength RUE Overall Strength Comments: limited AROM past 90 degrees LUE Assessment LUE Assessment: Within Functional Limits RLE Assessment RLE Assessment: Exceptions to [facility] RLE Strength RLE Overall Strength:  Deficits;Due to impaired cognition;Due to premorbid status RLE Overall Strength Comments: Pt with chronic B knee pain, unable to formally MMT due to cognition. Overall, pt demonstrates fair-good functional strength dependent upon pain and fatigue.  LLE Strength LLE Overall Strength: Deficits;Due to premorbid status;Due to impaired cognition LLE Overall Strength Comments: Pt with chronic B knee pain, unable to formally MMT due to cognition. Overall, pt demonstrates fair-good functional strength dependent upon pain and fatigue.   See FIM for current functional status  Gilmore Laroche 09/07/2014, 8:43 PM

## 2014-09-07 NOTE — Plan of Care (Signed)
Problem: RH BLADDER ELIMINATION Goal: RH STG MANAGE BLADDER WITH ASSISTANCE STG Manage Bladder With max Assistance  Outcome: Not Progressing Wears condom cath. At night,incont. At night

## 2014-09-07 NOTE — Progress Notes (Signed)
Subjective/Complaints: No new issues. Ready to go to SNF Review of Systems -remains limited due to cognitive status   Objective: Vital Signs: Blood pressure 141/82, pulse 86, temperature 98 F (36.7 C), temperature source Oral, resp. rate 18, weight 117.346 kg (258 lb 11.2 oz), SpO2 100.00%. No results found. Results for orders placed during the hospital encounter of 07/30/14 (from the past 72 hour(s))  GLUCOSE, CAPILLARY     Status: Abnormal   Collection Time    09/04/14  8:27 PM      Result Value Ref Range   Glucose-Capillary 118 (*) 70 - 99 mg/dL   Comment 1 Notify RN    GLUCOSE, CAPILLARY     Status: None   Collection Time    09/05/14  6:33 AM      Result Value Ref Range   Glucose-Capillary 98  70 - 99 mg/dL   Comment 1 Notify RN    GLUCOSE, CAPILLARY     Status: None   Collection Time    09/05/14 12:15 PM      Result Value Ref Range   Glucose-Capillary 98  70 - 99 mg/dL  GLUCOSE, CAPILLARY     Status: None   Collection Time    09/05/14  4:50 PM      Result Value Ref Range   Glucose-Capillary 91  70 - 99 mg/dL  GLUCOSE, CAPILLARY     Status: Abnormal   Collection Time    09/05/14  8:38 PM      Result Value Ref Range   Glucose-Capillary 104 (*) 70 - 99 mg/dL   Comment 1 Notify RN    GLUCOSE, CAPILLARY     Status: Abnormal   Collection Time    09/06/14  6:33 AM      Result Value Ref Range   Glucose-Capillary 127 (*) 70 - 99 mg/dL   Comment 1 Notify RN    BASIC METABOLIC PANEL     Status: Abnormal   Collection Time    09/06/14  7:17 AM      Result Value Ref Range   Sodium 145  137 - 147 mEq/L   Potassium 3.4 (*) 3.7 - 5.3 mEq/L   Chloride 102  96 - 112 mEq/L   CO2 29  19 - 32 mEq/L   Glucose, Bld 140 (*) 70 - 99 mg/dL   BUN 16  6 - 23 mg/dL   Creatinine, Ser 0.94  0.50 - 1.35 mg/dL   Calcium 10.0  8.4 - 10.5 mg/dL   GFR calc non Af Amer >90  >90 mL/min   GFR calc Af Amer >90  >90 mL/min   Comment: (NOTE)     The eGFR has been calculated using the CKD EPI  equation.     This calculation has not been validated in all clinical situations.     eGFR's persistently <90 mL/min signify possible Chronic Kidney     Disease.   Anion gap 14  5 - 15  GLUCOSE, CAPILLARY     Status: Abnormal   Collection Time    09/06/14 11:17 AM      Result Value Ref Range   Glucose-Capillary 121 (*) 70 - 99 mg/dL   Comment 1 Notify RN    GLUCOSE, CAPILLARY     Status: None   Collection Time    09/06/14  4:40 PM      Result Value Ref Range   Glucose-Capillary 93  70 - 99 mg/dL   Comment 1 Notify RN  GLUCOSE, CAPILLARY     Status: Abnormal   Collection Time    09/06/14  9:04 PM      Result Value Ref Range   Glucose-Capillary 110 (*) 70 - 99 mg/dL  GLUCOSE, CAPILLARY     Status: Abnormal   Collection Time    09/07/14  7:44 AM      Result Value Ref Range   Glucose-Capillary 100 (*) 70 - 99 mg/dL   Comment 1 Notify RN    GLUCOSE, CAPILLARY     Status: Abnormal   Collection Time    09/07/14 11:49 AM      Result Value Ref Range   Glucose-Capillary 115 (*) 70 - 99 mg/dL   Comment 1 Notify RN       HEENT: neck clean. Trach site closed Cardio: RRR and no murmur Resp: CTA B/L and unlabored, clear.  GI: BS positive and PEG site clean. Extremity:  Bilateral pedal edema trace to 1+  Skin:   Intact other than above Neuro:   Initiates more but still delayed. Improved attention/awareness.    Motor 4 LUE,  3+ to 4- RLE, 3 LLE, 5/5 RUE  Musc/Skel:  Normal GEN NAD   Assessment/Plan: 1. Functional deficits secondary to left Thalamic ICH, Hydrocephalus with Right  shunt which require 3+ hours per day of interdisciplinary therapy in a comprehensive inpatient rehab setting. Physiatrist is providing close team supervision and 24 hour management of active medical problems listed below. Physiatrist and rehab team continue to assess barriers to discharge/monitor patient progress toward functional and medical goals.  To SNF today. Follow up with me in 4-6  weeks.   FIM: FIM - Bathing Bathing Steps Patient Completed: Chest;Right Arm;Left Arm;Abdomen;Front perineal area;Right upper leg;Left upper leg;Right lower leg (including foot);Left lower leg (including foot) Bathing: 4: Min-Patient completes 8-9 [id number] parts or 75+ percent  FIM - Upper Body Dressing/Undressing Upper body dressing/undressing steps patient completed: Thread/unthread right sleeve of pullover shirt/dresss;Thread/unthread left sleeve of pullover shirt/dress;Put head through opening of pull over shirt/dress;Pull shirt over trunk Upper body dressing/undressing: 5: Supervision: Safety issues/verbal cues FIM - Lower Body Dressing/Undressing Lower body dressing/undressing steps patient completed: Thread/unthread right pants leg;Thread/unthread left pants leg;Pull pants up/down;Don/Doff right sock;Don/Doff left sock;Thread/unthread right underwear leg;Thread/unthread left underwear leg;Pull underwear up/down Lower body dressing/undressing: 4: Min-Patient completed 75 plus % of tasks  FIM - Toileting Toileting steps completed by patient: Adjust clothing prior to toileting;Adjust clothing after toileting Toileting Assistive Devices: Grab bar or rail for support Toileting: 3: Mod-Patient completed 2 of 3 steps  FIM - TRadio producerDevices: Elevated toilet seat Toilet Transfers: 4-To toilet/BSC: Min A (steadying Pt. > 75%);5-From toilet/BSC: Supervision (verbal cues/safety issues)  FIM - BEngineer, siteAssistive Devices: Arm rests;Walker;Bed rails Bed/Chair Transfer: 5: Supine > Sit: Supervision (verbal cues/safety issues);4: Bed > Chair or W/C: Min A (steadying Pt. > 75%)  FIM - Locomotion: Wheelchair Distance: 100 Locomotion: Wheelchair: 2: Travels 50 - 149 ft with supervision, cueing or coaxing FIM - Locomotion: Ambulation Locomotion: Ambulation Assistive Devices: WAdministratorAmbulation/Gait Assistance: 5: Supervision;4:  Min guard Locomotion: Ambulation: 4: Travels 150 ft or more with minimal assistance (Pt.>75%)  Comprehension Comprehension Mode: Auditory Comprehension: 3-Understands basic 50 - 74% of the time/requires cueing 25 - 50%  of the time  Expression Expression Mode: Verbal Expression Assistive Devices: 6-Talk trach valve Expression: 3-Expresses basic 50 - 74% of the time/requires cueing 25 - 50% of the time. Needs to repeat parts of  sentences.  Social Interaction Social Interaction Mode: Asleep Social Interaction: 2-Interacts appropriately 25 - 49% of time - Needs frequent redirection.  Problem Solving Problem Solving Mode: Asleep Problem Solving: 2-Solves basic 25 - 49% of the time - needs direction more than half the time to initiate, plan or complete simple activities  Memory Memory Mode: Asleep Memory: 2-Recognizes or recalls 25 - 49% of the time/requires cueing 51 - 75% of the time  Medical Problem List and Plan:  1. Functional deficits secondary to Left thalamic bleed with hydrocephalus.  2. DVT Prophylaxis/Anticoagulation/has IVCF: Pharmaceutical: Lovenox-  -TEDs for edema, elevation  -hx of dvt  3. Chronic B-knee/Pain Management: Prn oxycodone.   Voltaren gel for knees.  -poorly-compliant pain patient in my practice 4. Mood: Monitor as mentation improves.  5. Neuropsych: This patient is not capable of making decisions on his own behalf.   -continue   ritalin 6. Skin/Wound Care:   Continue air mattress overlay.   7. DM type 2: Will monitor BS with ac/hs checks. Continue Levemir bid    -reasonable control   8. Enterobacter tracheobronchitis: resolved 7. HTN/CAD:  -metoprolol bid  -continue HCTZ  -  norvasc  59m 8. ABLA: on iron supplement.   9. Dysphagia: on d1 thins, still eating well  -off TF  -advance diet per SLP 10. Bladder-continued timed toilet  -remains incontinent despite attempts at timed voiding/cueing   LOS (Days) 3[place]resEVALUATION WAS  PERFORMED  Fritz Cauthon T 09/07/2014, 5:05 PM

## 2014-09-07 NOTE — Progress Notes (Signed)
Occupational Therapy Discharge Summary  Patient Details  Name: Tony Davis MRN: [id number] Date of Birth: 16-Sep-1961  Patient has met 46 of 11 long term goals due to improved activity tolerance, improved balance and ability to compensate for deficits.  Pt made slow but steady progress with BADLs during this admission.  Pt continues to require min verbal cues and extra time to initiate tasks.  Pt requires min verbal cues for sustained attention to functional tasks.  Pt continues to exhibit impaired intellectual awareness and requires max verbal cues for consistent orientation. Patient to discharge at [facility] Assist level.  Patient's care partner unavailable to provide the necessary physical and cognitive assistance at discharge.    Recommendation:  Patient will benefit from ongoing skilled OT services in skilled nursing facility setting to continue to advance functional skills in the area of BADL.  Equipment: No equipment provided Discharge to SNF  Reasons for discharge: discharge from hospital  Patient/family agrees with progress made and goals achieved: Yes  OT Discharge Vital Signs Therapy Vitals Pulse Rate: 86 BP: 141/82 mmHg  Pain Pain Assessment Pain Assessment: No/denies pain  Vision/Perception  Vision- History Patient Visual Report: Peripheral vision impairment Vision- Assessment Vision Assessment?:  (unable to formally test secondarty to cognitive impairments)   Cognition Overall Cognitive Status: Impaired/Different from baseline Arousal/Alertness: Awake/alert Orientation Level: Oriented to place;Oriented to person Attention: Sustained Focused Attention: Appears intact Focused Attention Impairment: Verbal basic;Functional basic Sustained Attention: Impaired Sustained Attention Impairment: Verbal basic;Functional basic Memory: Impaired Memory Impairment: Retrieval deficit;Storage deficit;Decreased short term memory Decreased Short Term Memory: Verbal  basic;Functional basic Awareness: Impaired Awareness Impairment: Intellectual impairment Problem Solving: Impaired Problem Solving Impairment: Verbal basic;Functional basic Executive Function: Initiating (all impaired due to lower level deficits) Initiating: Impaired Initiating Impairment: Verbal basic;Functional basic Safety/Judgment: Impaired  Sensation Sensation Light Touch: Impaired by gross assessment Hot/Cold: Impaired by gross assessment Proprioception: Impaired by gross assessment Coordination Gross Motor Movements are Fluid and Coordinated: Yes Fine Motor Movements are Fluid and Coordinated: No  Motor  Motor Motor: Within Functional Limits  Mobility      Trunk/Postural Assessment  Cervical Assessment Cervical Assessment: Within Functional Limits Thoracic Assessment Thoracic Assessment: Within Functional Limits Lumbar Assessment Lumbar Assessment: Within Functional Limits Postural Control Postural Control: Within Functional Limits    Extremity/Trunk Assessment RUE Assessment RUE Assessment: Exceptions to [facility] RUE Strength RUE Overall Strength Comments: limited AROM past 90 degrees LUE Assessment LUE Assessment: Within Functional Limits  See FIM for current functional status  Lando Alcalde 09/07/2014, 2:53 PM

## 2014-09-07 NOTE — Progress Notes (Signed)
Social Work  Discharge Note  The overall goal for the admission was met for:   Discharge location: No - plan changed to SNF  Length of Stay: No - extended due to change in d/c plan and bed search process - LOS=39 days  Discharge activity level: Yes - however, family cannot meet this level of care in the home  Home/community participation: No  Services provided included: MD, RD, PT, OT, SLP, RN, TR, Pharmacy, Neuropsych and SW  Financial Services: Medicare  Follow-up services arranged: Other: SNF at [facility]  Comments (or additional information):  Patient/Family verbalized understanding of follow-up arrangements: Yes  Individual responsible for coordination of the follow-up plan: wife  Confirmed correct DME delivered: NA    Tony Davis

## 2014-09-07 NOTE — Plan of Care (Signed)
Problem: RH Bed to Chair Transfers Goal: LTG Patient will perform bed/chair transfers w/assist (PT) LTG: Patient will perform bed/chair transfers with assistance, with/without cues (PT).  Outcome: Not Met (add Reason) Requires supervision-min A  Problem: RH Ambulation Goal: LTG Patient will ambulate in controlled environment (PT) LTG: Patient will ambulate in a controlled environment, # of feet with assistance (PT).  Outcome: Not Met (add Reason) Requires supervision-min A  Problem: RH Memory Goal: LTG Patient demonstrate ability for day to day recall (PT) LTG: Patient will demonstrate ability for day to day recall/carryover during mobility activities with assist (PT)  Outcome: Not Applicable Date Met:  21/62/44 Requires Total A   Problem: RH Awareness Goal: LTG: Patient will demonstrate intellectual/emergent (PT) LTG: Patient will demonstrate intellectual/emergent/anticipatory awareness with assist during a mobility activity (PT)  Outcome: Not Met (add Reason) Requires total A  Problem: RH Ambulation Goal: LTG Patient will ambulate in home environment (PT) LTG: Patient will ambulate in home environment, # of feet with assistance (PT).  Outcome: Not Met (add Reason) Requires supervision-min A

## 2014-09-07 NOTE — Progress Notes (Signed)
Speech Language Pathology Session Note & Discharge Summary  Patient Details  Name: Tony Davis MRN: [id number] Date of Birth: 09-07-1961  Today's Date: 09/07/2014 SLP Individual Time: 0900-1000 SLP Individual Time Calculation (min): 60 min   Skilled Therapeutic Interventions: Skilled treatment session focused on dysphagia and cognitive-linguistic goals. Initially, patient was Mod I for initiation but progressed to Min A multimodal cues for self-feeding. Patient also required Min A multimodal cues for utilization of swallowing compensatory strategies of small bites and sips during breakfast meal of Dys. 2 textures with thin liquids without overt s/s of aspiration. Patient utilized verbal responses instead of gestures with 50% of opportunities and required Min A verbal cues to increase vocal intensity for improved intelligibility. Student facilitated session by providing Supervision question cues for basic yes/no biographical information and Mod-Max A multimodal cues for complex yes/no questions, following 2-3 step commands and confrontational naming, however, patient was Mod I for following 1 step commands and responsive naming. Continue with current plan of care.    Patient has met 4 of 6 long term goals.  Patient to discharge at overall Max level.   Reasons goals not met: Pt requires Max A for initation of verbal expression of wants/needs and Total A for problem solving basic and familiar tasks.   Clinical Impression/Discharge Summary: Patient has made functional gains and has met 4 of 6 LTG's this admission due to increased sustained attention, initiation and swallowing function.  Currently, patient requires Min A for attention, Mod assist for initiation and Max-Total A multimodal cues for problem solving during basic and familiar tasks. Patient also requires Max A multimodal cues for verbal expression of wants/needs. Patient is currently consuming Dys. 2 textures with thin liquids without  overt s/s of aspiration and requires Min-Mod A multimodal cues for utilization of swallowing compensatory strategies. Patient's family is unable to provide the necessary assistance needed at this time, therefore, patient will discharge to a SNF. Patient would benefit from f/u SLP services to maximize his cognitive-linguistic and swallowing function and overall functional independence and reduce burden of care.    Care Partner:  Caregiver Able to Provide Assistance: No  Type of Caregiver Assistance: Physical;Cognitive  Recommendation:  Skilled Nursing facility;24 hour supervision/assistance  Rationale for SLP Follow Up: Maximize cognitive function and independence;Maximize swallowing safety;Reduce caregiver burden;Maximize functional communication   Equipment: N/A   Reasons for discharge: Discharged from hospital   Patient/Family Agrees with Progress Made and Goals Achieved: Yes   See FIM for current functional status  Leslee Suire 09/07/2014, 12:02 PM

## 2014-09-07 NOTE — Plan of Care (Signed)
Problem: RH SAFETY Goal: RH STG ADHERE TO SAFETY PRECAUTIONS W/ASSISTANCE/DEVICE STG Adhere to Safety Precautions With Max Assistance/Device.  Outcome: Not Progressing Poor safety awareness, will take off quick release belt, needs re-direction for safety

## 2014-09-07 NOTE — Plan of Care (Signed)
Problem: RH Memory Goal: LTG Patient demonstrate ability for day to day recall (PT) LTG: Patient will demonstrate ability for day to day recall/carryover during mobility activities with assist (PT)  Outcome: Not Met (add Reason) Requires Total A

## 2014-09-07 NOTE — Progress Notes (Signed)
Patient discharged to [facility]

## 2014-09-07 NOTE — Progress Notes (Signed)
The skilled treatment note has been reviewed and SLP is in agreement.  Dinesha Twiggs, M.A., CCC-SLP  [phone]   

## 2014-09-07 NOTE — Progress Notes (Signed)
Occupational Therapy Session Note  Patient Details  Name: Tony Davis MRN: [id number] Date of Birth: Sep 23, 1961  Today's Date: 09/07/2014 OT Individual Time: 0800-0900 OT Individual Time Calculation (min): 60 min    Short Term Goals: Week 5:  OT Short Term Goal 1 (Week 5): Pt will perform toileting tasks with mod A OT Short Term Goal 2 (Week 5): Pt will maintain dynamic sitting balance during ADLs with min A  Skilled Therapeutic Interventions/Progress Updates:    Pt engaged in BADL retraining including bathing at shower level and dressing with sit<>stand from w/c.  Pt required min verbal cues for task initiation and extra time to complete tasks throughout session.  Pt required min verbal cues for redirection when internally/externally distracted.  Pt initiated bathing and dressing tasks when presented with supplies/clothing.  Pt responded appropriately when asked questions and when greeted at beginning of session.  Focus on activity tolerance, tasks initiation, sequencing, attention to task, sit<>stand, standing balance, and safety awareness.  Therapy Documentation Precautions:  Precautions Precautions: Fall Precaution Comments: PEG/abdominal precautions, Bilateral thigh high TED hose  Restrictions Weight Bearing Restrictions: No  Pt denied pain See FIM for current functional status  Therapy/Group: Individual Therapy  Leroy Libman 09/07/2014, 9:01 AM

## 2014-09-08 DIAGNOSIS — E119 Type 2 diabetes mellitus without complications: Secondary | ICD-10-CM | POA: Diagnosis not present

## 2014-09-08 DIAGNOSIS — I10 Essential (primary) hypertension: Secondary | ICD-10-CM | POA: Diagnosis not present

## 2014-09-08 DIAGNOSIS — I6990 Unspecified sequelae of unspecified cerebrovascular disease: Secondary | ICD-10-CM | POA: Diagnosis not present

## 2014-09-24 DIAGNOSIS — E119 Type 2 diabetes mellitus without complications: Secondary | ICD-10-CM | POA: Diagnosis not present

## 2014-09-24 DIAGNOSIS — Z9889 Other specified postprocedural states: Secondary | ICD-10-CM | POA: Diagnosis not present

## 2014-09-24 DIAGNOSIS — I69920 Aphasia following unspecified cerebrovascular disease: Secondary | ICD-10-CM | POA: Diagnosis not present

## 2014-09-24 DIAGNOSIS — I10 Essential (primary) hypertension: Secondary | ICD-10-CM | POA: Diagnosis not present

## 2014-10-06 ENCOUNTER — Encounter: Payer: Medicare Other | Admitting: Physical Medicine & Rehabilitation

## 2014-10-10 DIAGNOSIS — F329 Major depressive disorder, single episode, unspecified: Secondary | ICD-10-CM | POA: Diagnosis not present

## 2014-10-10 DIAGNOSIS — I2510 Atherosclerotic heart disease of native coronary artery without angina pectoris: Secondary | ICD-10-CM | POA: Diagnosis not present

## 2014-10-10 DIAGNOSIS — E118 Type 2 diabetes mellitus with unspecified complications: Secondary | ICD-10-CM | POA: Diagnosis not present

## 2014-10-10 DIAGNOSIS — E119 Type 2 diabetes mellitus without complications: Secondary | ICD-10-CM | POA: Diagnosis not present

## 2014-10-10 DIAGNOSIS — I10 Essential (primary) hypertension: Secondary | ICD-10-CM | POA: Diagnosis not present

## 2014-10-10 DIAGNOSIS — E876 Hypokalemia: Secondary | ICD-10-CM | POA: Diagnosis not present

## 2014-10-10 DIAGNOSIS — R488 Other symbolic dysfunctions: Secondary | ICD-10-CM | POA: Diagnosis not present

## 2014-10-10 DIAGNOSIS — I270 Primary pulmonary hypertension: Secondary | ICD-10-CM | POA: Diagnosis not present

## 2014-10-10 DIAGNOSIS — I69391 Dysphagia following cerebral infarction: Secondary | ICD-10-CM | POA: Diagnosis not present

## 2014-10-10 DIAGNOSIS — M1712 Unilateral primary osteoarthritis, left knee: Secondary | ICD-10-CM | POA: Diagnosis not present

## 2014-10-10 DIAGNOSIS — R269 Unspecified abnormalities of gait and mobility: Secondary | ICD-10-CM | POA: Diagnosis not present

## 2014-10-10 DIAGNOSIS — I69151 Hemiplegia and hemiparesis following nontraumatic intracerebral hemorrhage affecting right dominant side: Secondary | ICD-10-CM | POA: Diagnosis not present

## 2014-10-10 DIAGNOSIS — D529 Folate deficiency anemia, unspecified: Secondary | ICD-10-CM | POA: Diagnosis not present

## 2014-10-10 DIAGNOSIS — E785 Hyperlipidemia, unspecified: Secondary | ICD-10-CM | POA: Diagnosis not present

## 2014-10-10 DIAGNOSIS — K5900 Constipation, unspecified: Secondary | ICD-10-CM | POA: Diagnosis not present

## 2014-10-10 DIAGNOSIS — I82409 Acute embolism and thrombosis of unspecified deep veins of unspecified lower extremity: Secondary | ICD-10-CM | POA: Diagnosis not present

## 2014-10-10 DIAGNOSIS — M6281 Muscle weakness (generalized): Secondary | ICD-10-CM | POA: Diagnosis not present

## 2014-10-10 DIAGNOSIS — Z23 Encounter for immunization: Secondary | ICD-10-CM | POA: Diagnosis not present

## 2014-10-10 DIAGNOSIS — R1312 Dysphagia, oropharyngeal phase: Secondary | ICD-10-CM | POA: Diagnosis not present

## 2014-10-13 ENCOUNTER — Inpatient Hospital Stay: Payer: Medicare Other | Admitting: Internal Medicine

## 2014-10-18 ENCOUNTER — Other Ambulatory Visit: Payer: Self-pay | Admitting: Physical Medicine & Rehabilitation

## 2014-10-20 ENCOUNTER — Other Ambulatory Visit (INDEPENDENT_AMBULATORY_CARE_PROVIDER_SITE_OTHER): Payer: Medicare Other

## 2014-10-20 ENCOUNTER — Ambulatory Visit (INDEPENDENT_AMBULATORY_CARE_PROVIDER_SITE_OTHER): Payer: Medicare Other | Admitting: Internal Medicine

## 2014-10-20 VITALS — BP 132/82 | HR 83 | Temp 98.6°F | Resp 16 | Ht 71.0 in

## 2014-10-20 DIAGNOSIS — I10 Essential (primary) hypertension: Secondary | ICD-10-CM

## 2014-10-20 DIAGNOSIS — E119 Type 2 diabetes mellitus without complications: Secondary | ICD-10-CM | POA: Diagnosis not present

## 2014-10-20 DIAGNOSIS — E785 Hyperlipidemia, unspecified: Secondary | ICD-10-CM | POA: Diagnosis not present

## 2014-10-20 DIAGNOSIS — I2510 Atherosclerotic heart disease of native coronary artery without angina pectoris: Secondary | ICD-10-CM

## 2014-10-20 DIAGNOSIS — E118 Type 2 diabetes mellitus with unspecified complications: Secondary | ICD-10-CM | POA: Diagnosis not present

## 2014-10-20 DIAGNOSIS — Z23 Encounter for immunization: Secondary | ICD-10-CM

## 2014-10-20 DIAGNOSIS — D529 Folate deficiency anemia, unspecified: Secondary | ICD-10-CM

## 2014-10-20 DIAGNOSIS — I69391 Dysphagia following cerebral infarction: Secondary | ICD-10-CM | POA: Diagnosis not present

## 2014-10-20 LAB — CBC WITH DIFFERENTIAL/PLATELET
BASOS PCT: 0.1 % (ref 0.0–3.0)
Basophils Absolute: 0 10*3/uL (ref 0.0–0.1)
EOS ABS: 0.3 10*3/uL (ref 0.0–0.7)
Eosinophils Relative: 3.5 % (ref 0.0–5.0)
HCT: 41 % (ref 39.0–52.0)
Hemoglobin: 13.4 g/dL (ref 13.0–17.0)
LYMPHS ABS: 1.7 10*3/uL (ref 0.7–4.0)
LYMPHS PCT: 19.5 % (ref 12.0–46.0)
MCHC: 32.7 g/dL (ref 30.0–36.0)
MCV: 85.4 fl (ref 78.0–100.0)
Monocytes Absolute: 0.1 10*3/uL (ref 0.1–1.0)
Monocytes Relative: 1.5 % — ABNORMAL LOW (ref 3.0–12.0)
NEUTROS ABS: 6.4 10*3/uL (ref 1.4–7.7)
Neutrophils Relative %: 75.4 % (ref 43.0–77.0)
Platelets: 299 10*3/uL (ref 150.0–400.0)
RBC: 4.8 Mil/uL (ref 4.22–5.81)
RDW: 14.3 % (ref 11.5–15.5)
WBC: 8.5 10*3/uL (ref 4.0–10.5)

## 2014-10-20 LAB — BASIC METABOLIC PANEL
BUN: 16 mg/dL (ref 6–23)
CALCIUM: 9.9 mg/dL (ref 8.4–10.5)
CHLORIDE: 106 meq/L (ref 96–112)
CO2: 30 mEq/L (ref 19–32)
Creatinine, Ser: 1.1 mg/dL (ref 0.4–1.5)
GFR: 95.81 mL/min (ref >60.00)
Glucose, Bld: 109 mg/dL — ABNORMAL HIGH (ref 70–99)
Potassium: 3.7 mEq/L (ref 3.5–5.1)
SODIUM: 145 meq/L (ref 135–145)

## 2014-10-20 LAB — LIPID PANEL
Cholesterol: 187 mg/dL (ref 0–200)
HDL: 30.6 mg/dL — AB (ref >39.00)
NONHDL: 156.4
Total CHOL/HDL Ratio: 6
Triglycerides: 269 mg/dL — ABNORMAL HIGH (ref 0.0–149.0)
VLDL: 53.8 mg/dL — ABNORMAL HIGH (ref 0.0–40.0)

## 2014-10-20 LAB — LDL CHOLESTEROL, DIRECT: LDL DIRECT: 108.8 mg/dL

## 2014-10-20 LAB — HEMOGLOBIN A1C: Hgb A1c MFr Bld: 6.3 % (ref 4.6–6.5)

## 2014-10-20 LAB — TSH: TSH: 1.23 u[IU]/mL (ref 0.35–4.50)

## 2014-10-20 NOTE — Progress Notes (Signed)
Pre visit review using our clinic review tool, if applicable. No additional management support is needed unless otherwise documented below in the visit note. 

## 2014-10-20 NOTE — Assessment & Plan Note (Signed)
I will recheck his A1C and will treat if needed

## 2014-10-20 NOTE — Assessment & Plan Note (Signed)
My order to recheck his folate level was blocked by the software I will recheck his CBC today

## 2014-10-20 NOTE — Assessment & Plan Note (Signed)
His BP is well controlled I will monitor his lytes and renal function today 

## 2014-10-20 NOTE — Progress Notes (Signed)
   Subjective:    Patient ID: Tony Davis, male    DOB: 09/05/1961, 53 y.o.   MRN: [id number]  HPI  He returns for f/up after having a CNS hemorrhage aout 6 months ago. He is currently in a nursing home. His daughter is with him today and she tells the history since he can't communicate. She tells me that he is slowing making progress with therapy. According to her he is at his baseline since the CVA today.   Review of Systems  Unable to perform ROS      Objective:   Physical Exam  Constitutional: He appears well-developed and well-nourished. No distress.  HENT:  Mouth/Throat: Oropharynx is clear and moist. No oropharyngeal exudate.  Eyes: Conjunctivae are normal. Right eye exhibits no discharge. Left eye exhibits no discharge. No scleral icterus.  Neck: Normal range of motion. Neck supple. No JVD present. No tracheal deviation present. No thyromegaly present.  Cardiovascular: Normal rate, regular rhythm, normal heart sounds and intact distal pulses.  Exam reveals no gallop and no friction rub.   No murmur heard. Pulmonary/Chest: Effort normal and breath sounds normal. No stridor. No respiratory distress. He has no wheezes. He has no rales. He exhibits no tenderness.  Abdominal: Soft. Bowel sounds are normal. He exhibits no distension and no mass. There is no tenderness. There is no rebound and no guarding.  Musculoskeletal: Normal range of motion. He exhibits edema (trace edema in BLE). He exhibits no tenderness.  Lymphadenopathy:    He has no cervical adenopathy.  Skin: Skin is warm and dry. No rash noted. He is not diaphoretic. No erythema. No pallor.  Psychiatric: He is slowed and withdrawn. Cognition and memory are impaired. He is noncommunicative. He exhibits abnormal recent memory and abnormal remote memory. He is inattentive.     Lab Results  Component Value Date   WBC 6.6 09/03/2014   HGB 13.2 09/03/2014   HCT 41.2 09/03/2014   PLT 304 09/03/2014   GLUCOSE 140*  09/06/2014   CHOL 121 02/11/2014   TRIG 107.0 02/11/2014   HDL 35.30* 02/11/2014   LDLDIRECT 154.3 03/06/2013   LDLCALC 64 02/11/2014   ALT 15 08/02/2014   AST 15 08/02/2014   NA 145 09/06/2014   K 3.4* 09/06/2014   CL 102 09/06/2014   CREATININE 0.94 09/06/2014   BUN 16 09/06/2014   CO2 29 09/06/2014   TSH 1.670 07/09/2014   PSA 0.32 02/11/2014   INR 4.8 05/27/2014   HGBA1C 6.8* 02/11/2014       Assessment & Plan:

## 2014-10-20 NOTE — Patient Instructions (Signed)

## 2014-10-21 DIAGNOSIS — Z23 Encounter for immunization: Secondary | ICD-10-CM

## 2014-10-22 ENCOUNTER — Inpatient Hospital Stay: Payer: Medicare Other | Admitting: Physical Medicine & Rehabilitation

## 2014-10-26 ENCOUNTER — Encounter: Payer: Self-pay | Admitting: Internal Medicine

## 2014-10-27 ENCOUNTER — Other Ambulatory Visit: Payer: Self-pay | Admitting: Internal Medicine

## 2014-10-27 DIAGNOSIS — K9423 Gastrostomy malfunction: Secondary | ICD-10-CM | POA: Insufficient documentation

## 2014-11-02 ENCOUNTER — Encounter: Payer: Self-pay | Admitting: Cardiovascular Disease

## 2014-11-02 ENCOUNTER — Telehealth: Payer: Self-pay | Admitting: *Deleted

## 2014-11-02 NOTE — Telephone Encounter (Signed)
Received my chart message from pt. I placed call to number listed in chart earlier today to get more information. No answer and no voicemail. Message sent through my chart to please call office to discuss swelling. Tried again at this time to reach pt but mailbox full. Dr. Angelena Form would like pt to have office visit. I placed call to mobile number listed for pt's wife. Left message to call office.

## 2014-11-03 ENCOUNTER — Encounter: Payer: Self-pay | Admitting: Physician Assistant

## 2014-11-03 ENCOUNTER — Ambulatory Visit: Payer: Medicare Other | Admitting: Physician Assistant

## 2014-11-03 ENCOUNTER — Ambulatory Visit (INDEPENDENT_AMBULATORY_CARE_PROVIDER_SITE_OTHER): Payer: Medicare Other | Admitting: Physician Assistant

## 2014-11-03 VITALS — BP 165/98 | HR 104 | Ht 71.0 in | Wt 291.0 lb

## 2014-11-03 DIAGNOSIS — I619 Nontraumatic intracerebral hemorrhage, unspecified: Secondary | ICD-10-CM

## 2014-11-03 DIAGNOSIS — Z79899 Other long term (current) drug therapy: Secondary | ICD-10-CM

## 2014-11-03 DIAGNOSIS — I471 Supraventricular tachycardia: Secondary | ICD-10-CM

## 2014-11-03 DIAGNOSIS — I10 Essential (primary) hypertension: Secondary | ICD-10-CM | POA: Insufficient documentation

## 2014-11-03 DIAGNOSIS — R609 Edema, unspecified: Secondary | ICD-10-CM | POA: Diagnosis not present

## 2014-11-03 DIAGNOSIS — R600 Localized edema: Secondary | ICD-10-CM

## 2014-11-03 DIAGNOSIS — R0602 Shortness of breath: Secondary | ICD-10-CM

## 2014-11-03 DIAGNOSIS — I519 Heart disease, unspecified: Secondary | ICD-10-CM | POA: Insufficient documentation

## 2014-11-03 DIAGNOSIS — I2510 Atherosclerotic heart disease of native coronary artery without angina pectoris: Secondary | ICD-10-CM

## 2014-11-03 DIAGNOSIS — Z91199 Patient's noncompliance with other medical treatment and regimen due to unspecified reason: Secondary | ICD-10-CM | POA: Insufficient documentation

## 2014-11-03 DIAGNOSIS — E46 Unspecified protein-calorie malnutrition: Secondary | ICD-10-CM | POA: Insufficient documentation

## 2014-11-03 DIAGNOSIS — D529 Folate deficiency anemia, unspecified: Secondary | ICD-10-CM | POA: Diagnosis not present

## 2014-11-03 DIAGNOSIS — R000 Tachycardia, unspecified: Secondary | ICD-10-CM

## 2014-11-03 DIAGNOSIS — G919 Hydrocephalus, unspecified: Secondary | ICD-10-CM | POA: Insufficient documentation

## 2014-11-03 DIAGNOSIS — I610 Nontraumatic intracerebral hemorrhage in hemisphere, subcortical: Secondary | ICD-10-CM

## 2014-11-03 DIAGNOSIS — Z9119 Patient's noncompliance with other medical treatment and regimen: Secondary | ICD-10-CM | POA: Insufficient documentation

## 2014-11-03 DIAGNOSIS — J9600 Acute respiratory failure, unspecified whether with hypoxia or hypercapnia: Secondary | ICD-10-CM | POA: Insufficient documentation

## 2014-11-03 DIAGNOSIS — J156 Pneumonia due to other aerobic Gram-negative bacteria: Secondary | ICD-10-CM | POA: Insufficient documentation

## 2014-11-03 DIAGNOSIS — I82403 Acute embolism and thrombosis of unspecified deep veins of lower extremity, bilateral: Secondary | ICD-10-CM | POA: Diagnosis not present

## 2014-11-03 DIAGNOSIS — Z931 Gastrostomy status: Secondary | ICD-10-CM | POA: Insufficient documentation

## 2014-11-03 LAB — COMPREHENSIVE METABOLIC PANEL
ALT: 11 U/L (ref 0–53)
AST: 12 U/L (ref 0–37)
Albumin: 4.4 g/dL (ref 3.5–5.2)
Alkaline Phosphatase: 127 U/L — ABNORMAL HIGH (ref 39–117)
BUN: 11 mg/dL (ref 6–23)
CALCIUM: 9.7 mg/dL (ref 8.4–10.5)
CHLORIDE: 101 meq/L (ref 96–112)
CO2: 29 meq/L (ref 19–32)
Creat: 0.97 mg/dL (ref 0.50–1.35)
GLUCOSE: 180 mg/dL — AB (ref 70–99)
Potassium: 3.7 mEq/L (ref 3.5–5.3)
Sodium: 141 mEq/L (ref 135–145)
TOTAL PROTEIN: 7.9 g/dL (ref 6.0–8.3)
Total Bilirubin: 0.5 mg/dL (ref 0.2–1.2)

## 2014-11-03 LAB — BRAIN NATRIURETIC PEPTIDE: BRAIN NATRIURETIC PEPTIDE: 9.4 pg/mL (ref 0.0–100.0)

## 2014-11-03 MED ORDER — NITROGLYCERIN 0.4 MG SL SUBL: 0.4000 mg | SUBLINGUAL_TABLET | SUBLINGUAL | Status: DC | PRN

## 2014-11-03 MED ORDER — ACETAMINOPHEN 325 MG PO TABS: 325.0000 mg | ORAL_TABLET | ORAL | Status: DC | PRN

## 2014-11-03 MED ORDER — AMLODIPINE BESYLATE 5 MG PO TABS: 5.0000 mg | ORAL_TABLET | Freq: Every day | ORAL | Status: DC

## 2014-11-03 MED ORDER — METOPROLOL SUCCINATE ER 50 MG PO TB24: ORAL_TABLET | ORAL | Status: DC

## 2014-11-03 MED ORDER — SENNOSIDES-DOCUSATE SODIUM 8.6-50 MG PO TABS: 1.0000 | ORAL_TABLET | Freq: Two times a day (BID) | ORAL | Status: DC | PRN

## 2014-11-03 MED ORDER — CETYLPYRIDINIUM CHLORIDE 0.05 % MT LIQD: 7.0000 mL | Freq: Two times a day (BID) | OROMUCOSAL | Status: DC

## 2014-11-03 MED ORDER — METHYLPHENIDATE HCL 5 MG PO TABS: 5.0000 mg | ORAL_TABLET | Freq: Two times a day (BID) | ORAL | Status: DC

## 2014-11-03 MED ORDER — LISINOPRIL 5 MG PO TABS: 5.0000 mg | ORAL_TABLET | Freq: Every day | ORAL | Status: DC

## 2014-11-03 MED ORDER — PANTOPRAZOLE SODIUM 40 MG PO TBEC: 40.0000 mg | DELAYED_RELEASE_TABLET | Freq: Two times a day (BID) | ORAL | Status: DC

## 2014-11-03 MED ORDER — ATORVASTATIN CALCIUM 80 MG PO TABS: 80.0000 mg | ORAL_TABLET | Freq: Every evening | ORAL | Status: DC

## 2014-11-03 MED ORDER — FERROUS SULFATE 220 (44 FE) MG/5ML PO ELIX: ORAL_SOLUTION | ORAL | Status: DC

## 2014-11-03 MED ORDER — CHLORHEXIDINE GLUCONATE 0.12 % MT SOLN: 15.0000 mL | Freq: Two times a day (BID) | OROMUCOSAL | Status: DC

## 2014-11-03 MED ORDER — BACITRACIN ZINC 500 UNIT/GM EX OINT: TOPICAL_OINTMENT | CUTANEOUS | Status: DC | PRN

## 2014-11-03 NOTE — Telephone Encounter (Signed)
Follow up ° ° ° ° °Returning a nurses call °

## 2014-11-03 NOTE — Progress Notes (Signed)
[address], [place], [place]  [address] Phone: [phone] Fax:  [phone]  Date:  11/03/2014   Patient ID:  Tony Davis, Tony Davis Tony Davis, Tony Davis, MRN [id number]   PCP:  [name], MD  Cardiologist:  Angelena Form   History of Present Illness: Tony Davis is a 53 y.o. male with complex PHM of CAD (DES-Cx 2011, cath 04/2013 - stable, possible small vessel dz), CVAs/TIAs, HTN, HLD, prior LV dysfunction, prior medical noncompliance, recent admission for basal ganglia hemorrhage (complicated by intraventricular extension and hydrocephalus requiring right crani and VP shunt, encephalopathy, protein calorie malnutrition, acute resp failure req trache and PEG, BLE DVT s/p IVC filter, enterobacter PNA, anemia, subsequent right hemiparesis and difficulty communicating). He presents as an add-on to flex clinic for evaluation of lower extremity edema.  Dr. Camillia Herter note from June outlines his most recent cardiac history - "I saw him in February 2011 for complaints of chest pain and dizziness. I arranged a stress test, an echo and carotid dopplers at the first visit. The carotid dopplers were normal. He came in for his stress test and complained of neurological changes. He was seen by Dr. Lia Foyer and the stress test was cancelled. He was sent over to Dr. Stones office and had MRI/MRA of the head and neck. MRA showed no occlusive disease. MRI showed non-specific white matter changes but no clear infarct, possibly related to hypertension. His echo showed septal and inferobasal hypokinesis with mild concentric hypertrophy, EF 45-50%. Left heart cath October 2011 with severe stenosis in the mid Circumflex. PCI Circumflex with placement of Promus DES 10/13/10. Repeat echo August 2012 with PFO with positive bubble study. I arranged for him to see Dr. Burt Knack for PFO closure but he skipped his appointment. He was in [place] April 2013 and had acute onset of left arm and leg weakness. He was admitted  there. CT head without acute stroke. Venous dopplers with chronic DVT right femoral vein. Carotids with <30% RICA stenosis, LICA ok. Echo with PFO. Echo 03/14/12 in [place] with normal LV size and function with PFO with positive bubble study. He was started on coumadin and Lovenox and left AMA from the hospital there. I saw him in our office 03/19/12. At that time, he continued to have left sided weakness. He had not been compliant with plans for Lovenox and coumadin. He was admitted for Heparin and coumadin. TEE was done 03/21/12: EF normal, atrial septum with suspicion for interatrial septum fenestrations without flow across and few large bubbles noted in LA. This was not felt to require closure. He has been maintained on coumadin and has been followed in our coumadin clinic. Venous dopplers 2013 with no DVT. He has been been diagnosed with OSA and has been started on CPAP. He was admitted to [facility] 04/22/13 with chest pain. Cardiac markers were negative. Cardiac cath 04/24/13 with stable CAD. Patent stent distal Circumflex, mild disease LAD, moderate disease RCA. He was admitted to [facility] 04/14/14 after syncopal event felt to be due to increased Clonidine dose/orthostatic hypotension. Clonidine was stopped. Norvasc stopped in past due to edema. Another syncopal event two weeks ago in [employer]. BP low via EMS-105/70."  In the interim, the patient returns from a substantial admission this summer for CNS hemorrhage and complications from such. I do not have records from his initial admission (out of state in [place]) but there are very helpful records from [place] documenting the above complications. Last noninvasive study in  Epic was duplex 08/13/14 showing "Bilateral - Age indeterminant thrombus coursing from the popliteal vein through the femoral and common femoral veins. There is a superficial thrombus of the saphenofemoral junction." Rehab MD note reports "dopplers + (has hx)" and it  appears he received DVT ppx while in rehab but did not go home on anticoagulation. His wife tells me he was told he would no longer be a candidate for anticoagulation given his brain bleed. I have encouraged them to follow up with a local neurologist - he used to follow with them.  His wife brings him in today for evaluation of lower extremity edema which she says has been present ever since he's been in the nursing home. He was just discharged from [facility] yesterday. He is able to communicate some but does not contribute freely much to the conversation. He denies orthopnea, PND, chest pain, bleeding, or syncope. His wife has many concerns today, paramount of which is the fact that she has no idea what medications he should actually be taking so she has not yet given him anything today. Our visit today was prolonged and challenging due to the patient's wife bringing in a list from the nursing home which did not match their medication bag (which did not match his most recent list in Epic). She wants to know if it's OK that his Levemir has not been refrigerated. She does not have any way to check his blood sugar - no glucometer at home. She said she was supposed to hear back from primary care about what to do with his PEG tube now that he is feeding orally for the most part now but has not received further instructions yet.  His nursing home med list includes: MAPAP 325mg -650mg  q4hr prn Chlorhexadine 0.12% 24ml BID Fluticasone 21mcg 2 spray both nostrils BID Levemir 20 units BID* Ensure 1 can between meals BID Nitrostat 0.4mg  SL q18minx3 prn Bacitracin apply topically to peg wound site every day as needed Ferrous sulfate 220mg /55ml solution - give 7.48ml (350mg ) by mouth every day for anemia* Methylphenidate 5mg  BID* Florastor 250mg  BID Lisinopril 5mg  daily Klor-Con 84meq q8hr* (different dose on bottle) Atorvastatin 20mg  daily* (different dose on bottle) Toprol 50mg  1.5mg  BID Amlodipine 5mg   daily Famotidine 20mg  daily Voltaren gel 1% 2gm topically to bilateral knees QID* Senexon-S 8.6mg /50mg  tablet BID daily Furosemide 20mg  daily*  She does not have all of these medications - only the meds above with an asterisk. She also brings in a bag containing aspirin, warfarin, clonidine, isosorbide, roxicodone, carvedilol, irbesartan, combivent, and folvite.  Recent Labs: 02/11/2014: LDL (calc) 64 08/02/2014: ALT 15 10/20/2014: Creatinine 1.1; Direct LDL 108.8; HDL Cholesterol by NMR 30.60*; Hemoglobin 13.4; Potassium 3.7; TSH 1.23  Wt Readings from Last 3 Encounters:  11/03/14 291 lb (131.997 kg)  08/25/14 258 lb 11.2 oz (117.346 kg)  07/30/14 275 lb (124.739 kg)     Past Medical History  Diagnosis Date  . GERD (gastroesophageal reflux disease)   . HLD (hyperlipidemia)   . HTN (hypertension)   . Hematuria 8/12  . PFO (patent foramen ovale)     a. echo 8/12: EF 55-65%, grade 2 diast dysfnx; +PFO on bubble study. b. TEE 4/13: EF normal, atrial septum with suspicion for interatrial septum fenestrations without flow across and few large bubbles noted in LA.  This was not felt to require closure   . CAD (coronary artery disease)     a. s/p promus DES circumflex artery 10/13/10. b. Cath 04/2013: stable disease but  possible small vessel disease -Imdur added.  Marland Kitchen DVT (deep venous thrombosis) 2013    a. 2013. b. 2015 - reported BLE DVT s/p IVC filter.   Marland Kitchen TIA (transient ischemic attack)     "1-2; both after the one in 02/2010" (09/25/2013)  . OSA on CPAP   . Type II diabetes mellitus   . Arthritis     "knees" (09/25/2013)  . Rheumatoid arthritis(714.0)   . CVA (cerebral vascular accident) 02/2010    a. CVA 2011, prior reported TIAs. b. CNS hemorrhage 2015.  . Folate deficiency anemia   . Carotid artery disease     a. 1-29% BICA by study 04/2014.  Marland Kitchen BPH (benign prostatic hyperplasia)   . Basal ganglia hemorrhage 2015    a. 2015 - with intraventricular extension and hydrocephalus  requiring right crani and VP shunt, complicated by encephalopathy, protein calorie malnutrition, acute resp failure req trache and PEG, BLE DVT s/p IVC, enterobacter PNA, subsequent right hemiparesis and difficulty communicating.  Marland Kitchen Hydrocephalus 2015    a. 2/2 BG hemorrhage.  . Acute respiratory failure 2015    a. Following BG hemorrhage - s/p trache with decannulation.  . Enterobacter cloacae pneumonia 2015  . S/P percutaneous endoscopic gastrostomy (PEG) tube placement 2015  . Protein calorie malnutrition   . LV dysfunction     a. 2011 - EF 45-50%, subsequent 55-60% in 04/2014.  Marland Kitchen Noncompliance     Current Outpatient Prescriptions  Medication Sig Dispense Refill  . acetaminophen (TYLENOL) 325 MG tablet Take 1-2 tablets (325-650 mg total) by mouth every 4 (four) hours as needed for mild pain or moderate pain.    Marland Kitchen atorvastatin (LIPITOR) 80 MG tablet Take 1 tablet (80 mg total) by mouth every evening.    . diclofenac sodium (VOLTAREN) 1 % GEL Apply 2 g topically 4 (four) times daily.    . feeding supplement, ENSURE COMPLETE, (ENSURE COMPLETE) LIQD Take 237 mLs by mouth 2 (two) times daily.    . ferrous sulfate 220 (44 FE) MG/5ML solution Give 7.30mL (350mg ) by mouth every day for anemia.    . fluticasone (FLONASE) 50 MCG/ACT nasal spray Place 2 sprays into both nostrils 2 (two) times daily.    . furosemide (LASIX) 20 MG tablet Take 20 mg by mouth daily.    . insulin detemir (LEVEMIR) 100 UNIT/ML injection Inject 0.2 mLs (20 Units total) into the skin 2 (two) times daily. 10 mL 11  . methylphenidate (RITALIN) 5 MG tablet Take 1 tablet (5 mg total) by mouth 2 (two) times daily.    . metoprolol succinate (TOPROL-XL) 50 MG 24 hr tablet Take 1 and a half tablets by mouth twice a day. Take with or immediately following a meal. 90 tablet 6  . nitroGLYCERIN (NITROSTAT) 0.4 MG SL tablet Place 1 tablet (0.4 mg total) under the tongue every 5 (five) minutes as needed for chest pain (up to 3 doses). 25  tablet 3  . potassium chloride SA (K-DUR,KLOR-CON) 20 MEQ tablet Take 1 tablet (20 mEq total) by mouth 3 (three) times daily.    Marland Kitchen saccharomyces boulardii (FLORASTOR) 250 MG capsule Take 250 mg by mouth 2 (two) times daily.    . Water For Irrigation, Sterile (FREE WATER) SOLN Place 50 mLs into feeding tube 2 times daily at 12 noon and 4 pm.    . bacitracin ointment Apply topically as needed for wound care (To PEG tube every day as needed.).    Marland Kitchen chlorhexidine (PERIDEX) 0.12 % solution Use  as directed 15 mLs in the mouth or throat 2 (two) times daily.    Marland Kitchen lisinopril (PRINIVIL,ZESTRIL) 5 MG tablet Take 1 tablet (5 mg total) by mouth daily. 30 tablet 6  . pantoprazole (PROTONIX) 40 MG tablet Take 1 tablet (40 mg total) by mouth 2 (two) times daily.    Marland Kitchen senna-docusate (SENOKOT-S) 8.6-50 MG per tablet Take 1 tablet by mouth 2 (two) times daily as needed (constipation).     No current facility-administered medications for this visit.    Allergies:   Iodine and Amlodipine   Social History:  The patient  reports that he quit smoking about 2 years ago. His smoking use included Cigarettes. He has a 35 pack-year smoking history. He has never used smokeless tobacco. He reports that he does not drink alcohol or use illicit drugs.   Family History:  The patient's family history is negative for Cancer, Kidney disease, and Diabetes.  ROS:  Please see the history of present illness. All other systems reviewed and negative.   PHYSICAL EXAM:  VS:  BP 165/98 mmHg  Pulse 104  Ht 5\' 11"  (1.803 m)  Wt 291 lb (131.997 kg)  BMI 40.60 kg/m2 Obese chronically ill wheelchair bound WM, in no acute distress HEENT: normal Neck: no JVD Cardiac:  normal S1, S2; mildly tachycardic; no murmur Lungs:  clear to auscultation bilaterally, no wheezing, rhonchi or rales Abd: soft, nontender, no hepatomegaly Ext: 1-2+ BLE edema Skin: warm and dry Neuro:  flat affect, answers questions appropriately but short  answers  EKG:  Sinus tachycardia 104bpm, TWI in III/avF, flattening in V6, no acute change from prior  ASSESSMENT AND PLAN:  1. Lower extremity edema 2. Polypharmacy 3. H/o recurrent DVT - 2013, then recent BLE DVT s/p IVC filter, not felt to be a candidate for anticoagulation due to his CNS hemorrhage 4. Recent complex admission for basal ganglia hemorrhage, complicated by intraventricular extension and hydrocephalus requiring right crani and VP shunt, encephalopathy, protein calorie malnutrition, acute respiratory failure requiring trache and PEG, BLE DVT s/p IVC filter, enterobacter PNA, anemia, subsequent right hemiparesis and difficulty communicating 5. HTN, uncontrolled 6. Sinus tachycardia 7. CAD s/p PCI in 2011 8. Anemia, followed by PCP - resolved by CBC 11/11  Today's OV was quite complex. The initial issue at hand - his LEE - Tony be multifactorial. He has prior h/o LEE on amlodipine and was on this more recently at the nursing home. His wife does not know if he has this at home. I have asked him NOT to restart it at home if he does have this, as it Tony be the culprit. Will check BNP and CMET today along with scheduled 2D echo to further evaluate. Regarding sinus tach and elevated BP, this is likely in the setting of him not having any medications today (he is also now on methylphenidate per nursing home as well).   The majority of his visit was spent sorting out what medications he should be taking. I spent in excess of 1 hour in clinic trying to sort out his medications in a way that his wife could understand. In his med bag, he still had bottles of medications that have been changed or altered.  We made the following changes to the nursing home list: MAPAP 325mg -650mg  q4hr prn Chlorhexadine 0.12% 85ml BID Fluticasone 55mcg 2 spray both nostrils BID Levemir 20 units BID Ensure 1 can between meals BID Nitrostat 0.4mg  SL q62minx3 prn --> patient missing rx, I sent this in  Bacitracin apply topically to peg wound site every day as needed Ferrous sulfate 220mg /43ml solution - give 7.63ml (350mg ) by mouth every day for anemia Methylphenidate 5mg  BID Florastor 250mg  BID Lisinopril 5mg  daily --> patient missing rx, I sent this in  Klor-Con 42meq q8hr --> prior rx was 73meq BID - I wrote on his actual pill bottle to change the dose to 44meq 1 tablet every 8 hours (three times daily) Atorvastatin 20mg  daily --> he had atorva 80mg  in his pill bag - will resume this dose instead since he does not have an rx for atorva 20mg  Toprol 50mg  1.5mg  BID --> patient missing rx, I sent this in  Amlodipine 5mg  daily --> discontinued today due to edema Famotidine 20mg  daily --> he had Protonix 40mg  BID in his pill bag - will resume this instead since he does not have an rx for famotidine 20mg  Voltaren gel 1% 2gm topically to bilateral knees QID Senexon-S 8.6mg /50mg  tablet BID daily Furosemide 20mg  daily  From the old bottles in his pill bag that he should NOT be taking, I told him to STOP: ASPIRIN  WARFARIN / COUMADIN CLONIDINE ISOSORBIDE ROXICODONE CARVEDILOL IRBESARTAN  I put these bottles in a separate brown paper bag clearly labeled to stop.  From his old bottles in his pill bag that I am not sure about (since these were not on nursing home list), I asked him to please contact his primary care doctor regarding: FOLVITE COMBIVENT I put these bottles in a separate brown paper bag clearly labeled to ask his PCP about.  When his AVS printed out, I wrote out next to each medications which ones were available over the counter, which ones he had in his white medicine bag, and which ones were the new prescriptions (3 meds). We went over this in clinic and his wife seemed very appreciative. I also provided her with a prescription for a glucometer, test strips and lancets. I asked that she please f/u PCP to discuss plans for the PEG tube and to call her pharmacy to discuss what to  do with the non-refrigerated insulin. After the patient had left the clinic, I called both the [facility] and [facility] that he uses to make sure they did not have any wrong doses of aforementioned medications on file. For future prescriptions I strongly recommend he stick to one pharmacy.  Dispo: F/u 7-10 days with Dr. Angelena Form or APP to reassess BP, HR, edema, and medication adherence.  Signed, [name], PA-C  11/03/2014 5:04 PM

## 2014-11-03 NOTE — Telephone Encounter (Signed)
Appt time changed to 3:00 today. Pt's wife aware

## 2014-11-03 NOTE — Telephone Encounter (Signed)
Spoke with pt's wife and appt made for pt to see [name], PA today at 1:30

## 2014-11-03 NOTE — Patient Instructions (Addendum)
Your physician has recommended you make the following change in your medication:   1.STOP ASPIRIN  2.STOP WARFARIN / COUMADIN 3.STOP CLONIDINE 4.STOP ISOSORBIDE 5.STOP ROXICODONE 6.STOP CARVEDILOL 7.STOP IRBESARTAN   CONSULT YOUR PRIMARY CARE DOCTOR ABOUT WHAT TO DO WITH THESE MEDS: 1.FOLIC ACID 2.COMBIVENT   IF YOU HAVE AMLODIPINE AT HOME, PLEASE STOP THIS SINCE THIS CAN CAUSE LEG SWELLING.  - Call your pharmacy to discuss refrigeration of Levemir. - Please touch base with your primary care doctor about plans for your PEG tube. - See prescription for glucometer, test strips and lancets.   Your physician recommends that you have labwork today: BNP CMET  Your physician has requested that you have an echocardiogram. Echocardiography is a painless test that uses sound waves to create images of your heart. It provides your doctor with information about the size and shape of your heart and how well your heart's chambers and valves are working. This procedure takes approximately one hour. There are no restrictions for this procedure.   Your physician recommends that you schedule a follow-up appointment in:  In 7-10 days with WITH DR [place] APP

## 2014-11-07 DIAGNOSIS — Z794 Long term (current) use of insulin: Secondary | ICD-10-CM | POA: Diagnosis not present

## 2014-11-07 DIAGNOSIS — G4733 Obstructive sleep apnea (adult) (pediatric): Secondary | ICD-10-CM | POA: Diagnosis not present

## 2014-11-07 DIAGNOSIS — E1165 Type 2 diabetes mellitus with hyperglycemia: Secondary | ICD-10-CM | POA: Diagnosis not present

## 2014-11-07 DIAGNOSIS — Z931 Gastrostomy status: Secondary | ICD-10-CM | POA: Diagnosis not present

## 2014-11-07 DIAGNOSIS — I6911 Cognitive deficits following nontraumatic intracerebral hemorrhage: Secondary | ICD-10-CM | POA: Diagnosis not present

## 2014-11-07 DIAGNOSIS — Z95828 Presence of other vascular implants and grafts: Secondary | ICD-10-CM | POA: Diagnosis not present

## 2014-11-07 DIAGNOSIS — I69151 Hemiplegia and hemiparesis following nontraumatic intracerebral hemorrhage affecting right dominant side: Secondary | ICD-10-CM | POA: Diagnosis not present

## 2014-11-07 DIAGNOSIS — I10 Essential (primary) hypertension: Secondary | ICD-10-CM | POA: Diagnosis not present

## 2014-11-07 DIAGNOSIS — M1712 Unilateral primary osteoarthritis, left knee: Secondary | ICD-10-CM | POA: Diagnosis not present

## 2014-11-07 DIAGNOSIS — Z982 Presence of cerebrospinal fluid drainage device: Secondary | ICD-10-CM | POA: Diagnosis not present

## 2014-11-07 DIAGNOSIS — Z9181 History of falling: Secondary | ICD-10-CM | POA: Diagnosis not present

## 2014-11-07 DIAGNOSIS — I2510 Atherosclerotic heart disease of native coronary artery without angina pectoris: Secondary | ICD-10-CM | POA: Diagnosis not present

## 2014-11-07 DIAGNOSIS — M1711 Unilateral primary osteoarthritis, right knee: Secondary | ICD-10-CM | POA: Diagnosis not present

## 2014-11-08 ENCOUNTER — Telehealth: Payer: Self-pay | Admitting: *Deleted

## 2014-11-08 NOTE — Telephone Encounter (Signed)
mailbox full

## 2014-11-09 ENCOUNTER — Telehealth: Payer: Self-pay | Admitting: Internal Medicine

## 2014-11-09 ENCOUNTER — Telehealth: Payer: Self-pay | Admitting: Cardiovascular Disease

## 2014-11-09 DIAGNOSIS — I6911 Cognitive deficits following nontraumatic intracerebral hemorrhage: Secondary | ICD-10-CM | POA: Diagnosis not present

## 2014-11-09 DIAGNOSIS — I2510 Atherosclerotic heart disease of native coronary artery without angina pectoris: Secondary | ICD-10-CM | POA: Diagnosis not present

## 2014-11-09 DIAGNOSIS — I69151 Hemiplegia and hemiparesis following nontraumatic intracerebral hemorrhage affecting right dominant side: Secondary | ICD-10-CM | POA: Diagnosis not present

## 2014-11-09 DIAGNOSIS — E1165 Type 2 diabetes mellitus with hyperglycemia: Secondary | ICD-10-CM | POA: Diagnosis not present

## 2014-11-09 DIAGNOSIS — G4733 Obstructive sleep apnea (adult) (pediatric): Secondary | ICD-10-CM | POA: Diagnosis not present

## 2014-11-09 DIAGNOSIS — I10 Essential (primary) hypertension: Secondary | ICD-10-CM | POA: Diagnosis not present

## 2014-11-09 NOTE — Telephone Encounter (Signed)
Please advise refill? 

## 2014-11-09 NOTE — Telephone Encounter (Signed)
Advised patient's wife we cannot speak to her without a signed DPR.

## 2014-11-09 NOTE — Telephone Encounter (Signed)
lmptcb to go over lab results 

## 2014-11-09 NOTE — Telephone Encounter (Signed)
[place], [place] [phone] - needs verbal order for PT 2x's a week for 8 wks.

## 2014-11-09 NOTE — Telephone Encounter (Signed)
New Msg   Patient wife returning call, please contact wife at  [phone].

## 2014-11-10 ENCOUNTER — Telehealth: Payer: Self-pay | Admitting: *Deleted

## 2014-11-10 ENCOUNTER — Ambulatory Visit (HOSPITAL_COMMUNITY): Payer: Medicare Other | Attending: Cardiology | Admitting: Radiology

## 2014-11-10 DIAGNOSIS — E785 Hyperlipidemia, unspecified: Secondary | ICD-10-CM | POA: Insufficient documentation

## 2014-11-10 DIAGNOSIS — R600 Localized edema: Secondary | ICD-10-CM | POA: Diagnosis not present

## 2014-11-10 DIAGNOSIS — E119 Type 2 diabetes mellitus without complications: Secondary | ICD-10-CM | POA: Diagnosis not present

## 2014-11-10 DIAGNOSIS — I10 Essential (primary) hypertension: Secondary | ICD-10-CM | POA: Diagnosis not present

## 2014-11-10 NOTE — Progress Notes (Signed)
Echocardiogram performed.  

## 2014-11-10 NOTE — Telephone Encounter (Signed)
Spoke with pt's wife and reviewed lab results with her.

## 2014-11-10 NOTE — Telephone Encounter (Signed)
yes

## 2014-11-10 NOTE — Telephone Encounter (Signed)
Left message on wife's cell number to call office

## 2014-11-10 NOTE — Telephone Encounter (Signed)
Pt and wife here for pt's echo today.  Appt made for pt to see Dr.McAlhany on 12/4 at 11:45.

## 2014-11-10 NOTE — Telephone Encounter (Signed)
Spoke with pt's wife and reviewed lab results with her. I offered pt appt this Friday --12/4 with Dr. Angelena Form but she has to check to see if a family can bring pt.  She will let us know when here today with pt for his echo.

## 2014-11-10 NOTE — Telephone Encounter (Signed)
[place] notified of approval.

## 2014-11-10 NOTE — Telephone Encounter (Signed)
Pt needs follow up appt with provider. I placed call to pt's wife to schedule this appt and also to review lab results. Left message to call back.  Documents reviewed and pt signed HIPPA form on Apr 14, 2013 indicating no restrictions.

## 2014-11-11 ENCOUNTER — Encounter: Payer: Self-pay | Admitting: Internal Medicine

## 2014-11-12 ENCOUNTER — Encounter: Payer: Self-pay | Admitting: Cardiovascular Disease

## 2014-11-12 ENCOUNTER — Ambulatory Visit (INDEPENDENT_AMBULATORY_CARE_PROVIDER_SITE_OTHER): Payer: Medicare Other | Admitting: Cardiovascular Disease

## 2014-11-12 VITALS — BP 130/98 | HR 84 | Ht 71.0 in | Wt 293.0 lb

## 2014-11-12 DIAGNOSIS — R600 Localized edema: Secondary | ICD-10-CM | POA: Diagnosis not present

## 2014-11-12 DIAGNOSIS — I82403 Acute embolism and thrombosis of unspecified deep veins of lower extremity, bilateral: Secondary | ICD-10-CM

## 2014-11-12 DIAGNOSIS — Z931 Gastrostomy status: Secondary | ICD-10-CM

## 2014-11-12 DIAGNOSIS — I2510 Atherosclerotic heart disease of native coronary artery without angina pectoris: Secondary | ICD-10-CM

## 2014-11-12 DIAGNOSIS — I10 Essential (primary) hypertension: Secondary | ICD-10-CM | POA: Diagnosis not present

## 2014-11-12 MED ORDER — FUROSEMIDE 40 MG PO TABS: 40.0000 mg | ORAL_TABLET | Freq: Every day | ORAL | Status: DC

## 2014-11-12 NOTE — Patient Instructions (Addendum)
Your physician recommends that you schedule a follow-up appointment in:  3 months. Scheduled for February 16, 2015 at 4:15  You have been referred to  [place] has recommended you make the following change in your medication:  Increase furosemide to 40 mg by mouth daily

## 2014-11-12 NOTE — Progress Notes (Signed)
History of Present Illness: 53 y.o. male with complex PHM of CAD (DES-Cx 2011, cath 04/2013 - stable, possible small vessel dz), CVAs/TIAs, HTN, HLD, prior LV dysfunction, prior medical noncompliance, recent admission in [place] for basal ganglia hemorrhage (complicated by intraventricular extension and hydrocephalus requiring right crani and VP shunt, encephalopathy, protein calorie malnutrition, acute resp failure req trache and PEG, BLE DVT s/p IVC filter, enterobacter PNA, anemia, subsequent right hemiparesis and difficulty communicating). No anti-coagulation due to brain hemorrahge.  He was seen in our office 11/03/14 by [name], PA-C for evaluation of LE edema. Norvasc was stopped. BNP was normal. Echo 11/10/14 with normal LV size and systolic function. LE edema not felt to be cardiac related. Most of that visit was spent on medication reconciliation.   He is here today for cardiac follow up. Still not functioning well. Sleeps all day. PT coming to home. PEG tube still in place but not using. He is tolerating po intake. Wife concerned about his diabetes. He is not taking insulin although he was on this in the nursing home. Weight unchanged off of Norvasc. LE edema unchanged. Taking Lasix 20 mg daily.   Cardiac history summary: I met him in 2011. Stress test, an echo and carotid dopplers at the first visit. The carotid dopplers were normal. He came in for his stress test and complained of neurological changes. He was seen by Dr. Lia Foyer and the stress test was cancelled. He was sent over to Dr. Stones office and had MRI/MRA of the head and neck. MRA showed no occlusive disease. MRI showed non-specific white matter changes but no clear infarct, possibly related to hypertension. His echo showed septal and inferobasal hypokinesis with mild concentric hypertrophy, EF 45-50%. Left heart cath October 2011 with severe stenosis in the mid Circumflex. PCI Circumflex with placement of Promus DES 10/13/10.  Repeat echo August 2012 with PFO with positive bubble study. I arranged for him to see Dr. Burt Knack for PFO closure but he skipped his appointment. He was in [place] April 2013 and had acute onset of left arm and leg weakness. He was admitted there. CT head without acute stroke. Venous dopplers with chronic DVT right femoral vein. Carotids with <79% RICA stenosis, LICA ok. Echo with PFO. Echo 03/14/12 in [place] with normal LV size and function with PFO with positive bubble study. He was started on coumadin and Lovenox and left AMA from the hospital there. I saw him in our office 03/19/12. At that time, he continued to have left sided weakness. He had not been compliant with plans for Lovenox and coumadin. He was admitted for Heparin and coumadin. TEE was done 03/21/12: EF normal, atrial septum with suspicion for interatrial septum fenestrations without flow across and few large bubbles noted in LA. This was not felt to require closure. He has been maintained on coumadin and has been followed in our coumadin clinic. Venous dopplers 2013 with no DVT. He has been been diagnosed with OSA and has been started on CPAP. He was admitted to [facility] 04/22/13 with chest pain. Cardiac markers were negative. Cardiac cath 04/24/13 with stable CAD. Patent stent distal Circumflex, mild disease LAD, moderate disease RCA. He was admitted to [facility] 04/14/14 after syncopal event felt to be due to increased Clonidine dose/orthostatic hypotension. Clonidine was stopped. Norvasc stopped in past due to edema. Another syncopal event two weeks ago [place] [employer]. BP low via EMS-105/70."   Primary Care Physician: [name]  Last Lipid Profile:Lipid Panel  Component Value Date/Time   CHOL 187 10/20/2014 1148   TRIG 269.0* 10/20/2014 1148   HDL 30.60* 10/20/2014 1148   CHOLHDL 6 10/20/2014 1148   VLDL 53.8* 10/20/2014 1148   LDLCALC 64 02/11/2014 1422     Past Medical History  Diagnosis Date  . GERD (gastroesophageal reflux  disease)   . HLD (hyperlipidemia)   . HTN (hypertension)   . Hematuria 8/12  . PFO (patent foramen ovale)     a. echo 8/12: EF 55-65%, grade 2 diast dysfnx; +PFO on bubble study. b. TEE 4/13: EF normal, atrial septum with suspicion for interatrial septum fenestrations without flow across and few large bubbles noted in LA.  This was not felt to require closure   . CAD (coronary artery disease)     a. s/p promus DES circumflex artery 10/13/10. b. Cath 04/2013: stable disease but possible small vessel disease -Imdur added.  Marland Kitchen DVT (deep venous thrombosis) 2013    a. 2013. b. 2015 - reported BLE DVT s/p IVC filter.   Marland Kitchen TIA (transient ischemic attack)     "1-2; both after the one in 02/2010" (09/25/2013)  . OSA on CPAP   . Type II diabetes mellitus   . Arthritis     "knees" (09/25/2013)  . Rheumatoid arthritis(714.0)   . CVA (cerebral vascular accident) 02/2010    a. CVA 2011, prior reported TIAs. b. CNS hemorrhage 2015.  . Folate deficiency anemia   . Carotid artery disease     a. 1-29% BICA by study 04/2014.  Marland Kitchen BPH (benign prostatic hyperplasia)   . Basal ganglia hemorrhage 2015    a. 2015 - with intraventricular extension and hydrocephalus requiring right crani and VP shunt, complicated by encephalopathy, protein calorie malnutrition, acute resp failure req trache and PEG, BLE DVT s/p IVC, enterobacter PNA, subsequent right hemiparesis and difficulty communicating.  Marland Kitchen Hydrocephalus 2015    a. 2/2 BG hemorrhage.  . Acute respiratory failure 2015    a. Following BG hemorrhage - s/p trache with decannulation.  . Enterobacter cloacae pneumonia 2015  . S/P percutaneous endoscopic gastrostomy (PEG) tube placement 2015  . Protein calorie malnutrition   . LV dysfunction     a. 2011 - EF 45-50%, subsequent 55-60% in 04/2014.  Marland Kitchen Noncompliance     Past Surgical History  Procedure Laterality Date  . Knee arthroscopy Bilateral 2000's    2 on left and 3 on rt  . Tee without cardioversion   03/21/2012    Procedure: TRANSESOPHAGEAL ECHOCARDIOGRAM (TEE);  Surgeon: Fay Records, MD;  Location: [facility] ENDOSCOPY;  Service: Cardiovascular;  Laterality: N/A;  . Coronary angioplasty with stent placement  2012    "1" (09/24/2013)    Current Outpatient Prescriptions  Medication Sig Dispense Refill  . acetaminophen (TYLENOL) 325 MG tablet Take 1-2 tablets (325-650 mg total) by mouth every 4 (four) hours as needed for mild pain or moderate pain.    Marland Kitchen atorvastatin (LIPITOR) 80 MG tablet Take 1 tablet (80 mg total) by mouth every evening.    . bacitracin ointment Apply topically as needed for wound care (To PEG tube every day as needed.).    Marland Kitchen chlorhexidine (PERIDEX) 0.12 % solution Use as directed 15 mLs in the mouth or throat 2 (two) times daily.    . diclofenac sodium (VOLTAREN) 1 % GEL Apply 2 g topically 4 (four) times daily.    . feeding supplement, ENSURE COMPLETE, (ENSURE COMPLETE) LIQD Take 237 mLs by mouth 2 (two) times daily.    Marland Kitchen  ferrous sulfate 220 (44 FE) MG/5ML solution Give 7.81m (3525m by mouth every day for anemia.    . fluticasone (FLONASE) 50 MCG/ACT nasal spray Place 2 sprays into both nostrils 2 (two) times daily.    . furosemide (LASIX) 20 MG tablet Take 20 mg by mouth daily.    . insulin detemir (LEVEMIR) 100 UNIT/ML injection Inject 0.2 mLs (20 Units total) into the skin 2 (two) times daily. 10 mL 11  . lisinopril (PRINIVIL,ZESTRIL) 5 MG tablet Take 1 tablet (5 mg total) by mouth daily. 30 tablet 6  . methylphenidate (RITALIN) 5 MG tablet Take 1 tablet (5 mg total) by mouth 2 (two) times daily.    . metoprolol succinate (TOPROL-XL) 50 MG 24 hr tablet Take 1 and a half tablets by mouth twice a day. Take with or immediately following a meal. 90 tablet 6  . nitroGLYCERIN (NITROSTAT) 0.4 MG SL tablet Place 1 tablet (0.4 mg total) under the tongue every 5 (five) minutes as needed for chest pain (up to 3 doses). 25 tablet 3  . pantoprazole (PROTONIX) 40 MG tablet Take 1 tablet  (40 mg total) by mouth 2 (two) times daily.    . potassium chloride SA (K-DUR,KLOR-CON) 20 MEQ tablet Take 1 tablet (20 mEq total) by mouth 3 (three) times daily.    . Marland Kitchenaccharomyces boulardii (FLORASTOR) 250 MG capsule Take 250 mg by mouth 2 (two) times daily.    . Marland Kitchenenna-docusate (SENOKOT-S) 8.6-50 MG per tablet Take 1 tablet by mouth 2 (two) times daily as needed (constipation).    . Water For Irrigation, Sterile (FREE WATER) SOLN Place 50 mLs into feeding tube 2 times daily at 12 noon and 4 pm.     No current facility-administered medications for this visit.    Allergies  Allergen Reactions  . Iodine Anaphylaxis  . Amlodipine Swelling and Other (See Comments)    Foot edema    History   Social History  . Marital Status: Married    Spouse Name: N/A    Number of Children: N/A  . Years of Education: N/A   Occupational History  . Not on file.   Social History Main Topics  . Smoking status: Former Smoker -- 1.00 packs/day for 35 years    Types: Cigarettes    Quit date: 03/10/2012  . Smokeless tobacco: Never Used  . Alcohol Use: No  . Drug Use: No  . Sexual Activity: Yes   Other Topics Concern  . Not on file   Social History Narrative   Married, 5 kids; [place]     Family History  Problem Relation Age of Onset  . Cancer Neg Hx   . Kidney disease Neg Hx   . Diabetes Neg Hx   . Heart attack Sister   . Heart attack Father   . Stroke Neg Hx   . Hypertension Brother     Review of Systems:  As stated in the HPI and otherwise negative.   BP 130/98 mmHg  Pulse 84  Ht _0  (1.803 m)  Wt 293 lb (132.904 kg)  BMI 40.88 kg/m2  SpO2 99%  Physical Examination: General: Well developed, well nourished, NAD HEENT: OP clear, mucus membranes moist SKIN: warm, dry. No rashes. Neuro: No focal deficits Musculoskeletal: Muscle strength 5/5 all ext Psychiatric: Mood and affect normal Neck: No JVD, no carotid  bruits, no thyromegaly, no lymphadenopathy. Lungs:Clear bilaterally, no wheezes, rhonci, crackles  Cardiovascular: Regular rate and rhythm. No murmurs, gallops or rubs. Abdomen:Soft. Bowel sounds present. Non-tender.  Extremities: 2+ bilateral lower extremity edema.   Echo 11/10/14: Left ventricle: The cavity size was normal. There was moderate concentric hypertrophy. Systolic function was vigorous. The estimated ejection fraction was in the range of 65% to 70%. Wall motion was normal; there were no regional wall motion abnormalities. Features are consistent with a pseudonormal left ventricular filling pattern, with concomitant abnormal relaxation and increased filling pressure (grade 2 diastolic dysfunction). Doppler parameters are consistent with elevated ventricular end-diastolic filling pressure. - Aortic valve: Trileaflet; normal thickness leaflets. There was no regurgitation. - Mitral valve: Structurally normal valve. There was no regurgitation. - Left atrium: The atrium was mildly dilated. - Right ventricle: Systolic function was normal. - Right atrium: The atrium was normal in size. - Pulmonary arteries: Systolic pressure was within the normal range. - Inferior vena cava: The vessel was normal in size. - Pericardium, extracardiac: There was no pericardial effusion.  Assessment and Plan:   1. Lower extremity edema: Present for several months. 30 lb  Weight gain over last year. His BNP was not elevated and his LV function is normal. Not likely cardiac related. Will increase Lasix to 40 mg daily. Elevate legs when sitting. He will follow up in primary care for further management of non-cardiac lower ext edema.    2. H/o recurrent DVT : First in 2013, then recent BLE DVT s/p IVC filter, not felt to be a candidate for anticoagulation due to his CNS hemorrhage  3. History of CVA: Follow up with Neurology should be planned here locally.   4. HTN: BP better  controlled. No changes today.   5. CAD: No recent chest pains. Continue current meds.   6. PEG tube in place. He is no longer using this. Will refer to [facility] for evaluation and removal.   7. DM: His wife has very poor understanding of his diabetes management plan. He will need close f/u in primary care to discuss his regimen.

## 2014-11-16 ENCOUNTER — Emergency Department (HOSPITAL_COMMUNITY): Payer: Medicare Other

## 2014-11-16 ENCOUNTER — Encounter (HOSPITAL_COMMUNITY): Payer: Self-pay

## 2014-11-16 ENCOUNTER — Emergency Department (HOSPITAL_COMMUNITY)
Admission: EM | Admit: 2014-11-16 | Discharge: 2014-11-16 | Disposition: A | Discharge status: Home / Self Care | Payer: Medicare Other | Attending: Emergency Medicine | Admitting: Emergency Medicine

## 2014-11-16 DIAGNOSIS — Y92009 Unspecified place in unspecified non-institutional (private) residence as the place of occurrence of the external cause: Secondary | ICD-10-CM | POA: Diagnosis not present

## 2014-11-16 DIAGNOSIS — Z7951 Long term (current) use of inhaled steroids: Secondary | ICD-10-CM | POA: Diagnosis not present

## 2014-11-16 DIAGNOSIS — G4733 Obstructive sleep apnea (adult) (pediatric): Secondary | ICD-10-CM | POA: Diagnosis not present

## 2014-11-16 DIAGNOSIS — Z87891 Personal history of nicotine dependence: Secondary | ICD-10-CM | POA: Diagnosis not present

## 2014-11-16 DIAGNOSIS — M069 Rheumatoid arthritis, unspecified: Secondary | ICD-10-CM | POA: Diagnosis not present

## 2014-11-16 DIAGNOSIS — I6911 Cognitive deficits following nontraumatic intracerebral hemorrhage: Secondary | ICD-10-CM | POA: Diagnosis not present

## 2014-11-16 DIAGNOSIS — E785 Hyperlipidemia, unspecified: Secondary | ICD-10-CM | POA: Diagnosis not present

## 2014-11-16 DIAGNOSIS — Y998 Other external cause status: Secondary | ICD-10-CM | POA: Diagnosis not present

## 2014-11-16 DIAGNOSIS — D649 Anemia, unspecified: Secondary | ICD-10-CM | POA: Diagnosis not present

## 2014-11-16 DIAGNOSIS — W19XXXA Unspecified fall, initial encounter: Secondary | ICD-10-CM

## 2014-11-16 DIAGNOSIS — Z79899 Other long term (current) drug therapy: Secondary | ICD-10-CM | POA: Diagnosis not present

## 2014-11-16 DIAGNOSIS — Z794 Long term (current) use of insulin: Secondary | ICD-10-CM | POA: Diagnosis not present

## 2014-11-16 DIAGNOSIS — S8991XA Unspecified injury of right lower leg, initial encounter: Secondary | ICD-10-CM | POA: Diagnosis not present

## 2014-11-16 DIAGNOSIS — E1165 Type 2 diabetes mellitus with hyperglycemia: Secondary | ICD-10-CM | POA: Diagnosis not present

## 2014-11-16 DIAGNOSIS — Z8701 Personal history of pneumonia (recurrent): Secondary | ICD-10-CM | POA: Insufficient documentation

## 2014-11-16 DIAGNOSIS — K219 Gastro-esophageal reflux disease without esophagitis: Secondary | ICD-10-CM | POA: Insufficient documentation

## 2014-11-16 DIAGNOSIS — M79604 Pain in right leg: Secondary | ICD-10-CM

## 2014-11-16 DIAGNOSIS — M79651 Pain in right thigh: Secondary | ICD-10-CM | POA: Insufficient documentation

## 2014-11-16 DIAGNOSIS — Y9389 Activity, other specified: Secondary | ICD-10-CM | POA: Diagnosis not present

## 2014-11-16 DIAGNOSIS — S79919A Unspecified injury of unspecified hip, initial encounter: Secondary | ICD-10-CM | POA: Diagnosis not present

## 2014-11-16 DIAGNOSIS — E119 Type 2 diabetes mellitus without complications: Secondary | ICD-10-CM | POA: Diagnosis not present

## 2014-11-16 DIAGNOSIS — I10 Essential (primary) hypertension: Secondary | ICD-10-CM | POA: Insufficient documentation

## 2014-11-16 DIAGNOSIS — M5030 Other cervical disc degeneration, unspecified cervical region: Secondary | ICD-10-CM | POA: Diagnosis not present

## 2014-11-16 DIAGNOSIS — I2510 Atherosclerotic heart disease of native coronary artery without angina pectoris: Secondary | ICD-10-CM | POA: Insufficient documentation

## 2014-11-16 DIAGNOSIS — W1830XA Fall on same level, unspecified, initial encounter: Secondary | ICD-10-CM | POA: Insufficient documentation

## 2014-11-16 DIAGNOSIS — Z8709 Personal history of other diseases of the respiratory system: Secondary | ICD-10-CM | POA: Insufficient documentation

## 2014-11-16 DIAGNOSIS — Z9889 Other specified postprocedural states: Secondary | ICD-10-CM | POA: Diagnosis not present

## 2014-11-16 DIAGNOSIS — R55 Syncope and collapse: Secondary | ICD-10-CM | POA: Diagnosis not present

## 2014-11-16 DIAGNOSIS — G459 Transient cerebral ischemic attack, unspecified: Secondary | ICD-10-CM | POA: Diagnosis not present

## 2014-11-16 DIAGNOSIS — Z792 Long term (current) use of antibiotics: Secondary | ICD-10-CM | POA: Insufficient documentation

## 2014-11-16 DIAGNOSIS — Z86718 Personal history of other venous thrombosis and embolism: Secondary | ICD-10-CM | POA: Insufficient documentation

## 2014-11-16 DIAGNOSIS — S8992XA Unspecified injury of left lower leg, initial encounter: Secondary | ICD-10-CM | POA: Diagnosis not present

## 2014-11-16 DIAGNOSIS — Z8669 Personal history of other diseases of the nervous system and sense organs: Secondary | ICD-10-CM | POA: Diagnosis not present

## 2014-11-16 DIAGNOSIS — Z9861 Coronary angioplasty status: Secondary | ICD-10-CM | POA: Insufficient documentation

## 2014-11-16 DIAGNOSIS — Z931 Gastrostomy status: Secondary | ICD-10-CM | POA: Insufficient documentation

## 2014-11-16 DIAGNOSIS — R030 Elevated blood-pressure reading, without diagnosis of hypertension: Secondary | ICD-10-CM | POA: Diagnosis not present

## 2014-11-16 DIAGNOSIS — M79606 Pain in leg, unspecified: Secondary | ICD-10-CM | POA: Diagnosis not present

## 2014-11-16 DIAGNOSIS — R9431 Abnormal electrocardiogram [ECG] [EKG]: Secondary | ICD-10-CM | POA: Diagnosis not present

## 2014-11-16 DIAGNOSIS — Z8673 Personal history of transient ischemic attack (TIA), and cerebral infarction without residual deficits: Secondary | ICD-10-CM | POA: Diagnosis not present

## 2014-11-16 DIAGNOSIS — I69151 Hemiplegia and hemiparesis following nontraumatic intracerebral hemorrhage affecting right dominant side: Secondary | ICD-10-CM | POA: Diagnosis not present

## 2014-11-16 DIAGNOSIS — M79605 Pain in left leg: Secondary | ICD-10-CM | POA: Diagnosis not present

## 2014-11-16 DIAGNOSIS — S299XXA Unspecified injury of thorax, initial encounter: Secondary | ICD-10-CM | POA: Diagnosis not present

## 2014-11-16 DIAGNOSIS — Z87448 Personal history of other diseases of urinary system: Secondary | ICD-10-CM | POA: Diagnosis not present

## 2014-11-16 LAB — TYPE AND SCREEN
ABO/RH(D): O POS
Antibody Screen: NEGATIVE

## 2014-11-16 LAB — COMPREHENSIVE METABOLIC PANEL
ALBUMIN: 4.4 g/dL (ref 3.5–5.2)
ALK PHOS: 145 U/L — AB (ref 39–117)
ALT: 15 U/L (ref 0–53)
ANION GAP: 15 (ref 5–15)
AST: 16 U/L (ref 0–37)
BUN: 17 mg/dL (ref 6–23)
CO2: 28 mEq/L (ref 19–32)
CREATININE: 0.98 mg/dL (ref 0.50–1.35)
Calcium: 10.3 mg/dL (ref 8.4–10.5)
Chloride: 102 mEq/L (ref 96–112)
GFR calc non Af Amer: >90 mL/min (ref >90)
GLUCOSE: 90 mg/dL (ref 70–99)
POTASSIUM: 4.3 meq/L (ref 3.7–5.3)
Sodium: 145 mEq/L (ref 137–147)
TOTAL PROTEIN: 8.5 g/dL — AB (ref 6.0–8.3)
Total Bilirubin: 0.7 mg/dL (ref 0.3–1.2)

## 2014-11-16 LAB — CBC WITH DIFFERENTIAL/PLATELET
BASOS ABS: 0 10*3/uL (ref 0.0–0.1)
Basophils Relative: 0 % (ref 0–1)
Eosinophils Absolute: 0.2 10*3/uL (ref 0.0–0.7)
Eosinophils Relative: 2 % (ref 0–5)
HEMATOCRIT: 40.3 % (ref 39.0–52.0)
Hemoglobin: 13.1 g/dL (ref 13.0–17.0)
Lymphocytes Relative: 19 % (ref 12–46)
Lymphs Abs: 1.9 10*3/uL (ref 0.7–4.0)
MCH: 28.3 pg (ref 26.0–34.0)
MCHC: 32.5 g/dL (ref 30.0–36.0)
MCV: 87 fL (ref 78.0–100.0)
MONO ABS: 0.7 10*3/uL (ref 0.1–1.0)
Monocytes Relative: 7 % (ref 3–12)
NEUTROS ABS: 6.9 10*3/uL (ref 1.7–7.7)
Neutrophils Relative %: 72 % (ref 43–77)
Platelets: 300 10*3/uL (ref 150–400)
RBC: 4.63 MIL/uL (ref 4.22–5.81)
RDW: 14.6 % (ref 11.5–15.5)
WBC: 9.7 10*3/uL (ref 4.0–10.5)

## 2014-11-16 LAB — I-STAT TROPONIN, ED: TROPONIN I, POC: 0 ng/mL (ref 0.00–0.08)

## 2014-11-16 LAB — PROTIME-INR
INR: 1.1 (ref 0.00–1.49)
PROTHROMBIN TIME: 14.3 s (ref 11.6–15.2)

## 2014-11-16 LAB — ABO/RH: ABO/RH(D): O POS

## 2014-11-16 MED ORDER — HYDROCODONE-ACETAMINOPHEN 5-325 MG PO TABS: 1.0000 | ORAL_TABLET | ORAL | Status: DC | PRN

## 2014-11-16 NOTE — Discharge Instructions (Signed)
Take the prescribed medication as directed for pain control-- caution, may make you drowsy. Be sure to use your walker at home to prevent further falls. Follow-up with your primary care physician. Return to the ED for new or worsening symptoms.

## 2014-11-16 NOTE — ED Notes (Signed)
Pt ambulated with a walker with this RN and NT.

## 2014-11-16 NOTE — ED Provider Notes (Signed)
CSN: [id number]     Arrival date & time 11/16/14  1841 History   First MD Initiated Contact with Patient 11/16/14 1847     Chief Complaint  Patient presents with  . Fall     (Consider location/radiation/quality/duration/timing/severity/associated sxs/prior Treatment) Patient is a 53 y.o. male presenting with fall. The history is provided by the patient and medical records.  Fall Associated symptoms include arthralgias.    This is a 53 year old male with past medical history significant for hypertension, hyperlipidemia, GERD, coronary artery disease, diabetes, prior CVA, PEG tube in place, presenting to the ED for a fall. Patient lives at home with family members, this was an unwitnessed fall. Family members left the house at 0600 this morning and returned around 1800 and found patient lying in the floor.  Patient unsure exactly how long he was in the floor.  Patient states he lost his footing, denies dizziness or syncope prior to fall.  Patient usually ambulates with a walker at baseline, was not using this at time of fall.  He states he did hit his head against the floor but denies LOC.  Patient has pain of his right thigh, denies pain in the hip.  No chest pain or SOB.  No headache or dizziness.  Patient given fentanyl en route with improvement of his pain.  Patient not currently on any time of anti-coagulation. VS stable on arrival.  Past Medical History  Diagnosis Date  . GERD (gastroesophageal reflux disease)   . HLD (hyperlipidemia)   . HTN (hypertension)   . Hematuria 8/12  . PFO (patent foramen ovale)     a. echo 8/12: EF 55-65%, grade 2 diast dysfnx; +PFO on bubble study. b. TEE 4/13: EF normal, atrial septum with suspicion for interatrial septum fenestrations without flow across and few large bubbles noted in LA.  This was not felt to require closure   . CAD (coronary artery disease)     a. s/p promus DES circumflex artery 10/13/10. b. Cath 04/2013: stable disease but possible small  vessel disease -Imdur added.  Marland Kitchen DVT (deep venous thrombosis) 2013    a. 2013. b. 2015 - reported BLE DVT s/p IVC filter.   Marland Kitchen TIA (transient ischemic attack)     "1-2; both after the one in 02/2010" (09/25/2013)  . OSA on CPAP   . Type II diabetes mellitus   . Arthritis     "knees" (09/25/2013)  . Rheumatoid arthritis(714.0)   . CVA (cerebral vascular accident) 02/2010    a. CVA 2011, prior reported TIAs. b. CNS hemorrhage 2015.  . Folate deficiency anemia   . Carotid artery disease     a. 1-29% BICA by study 04/2014.  Marland Kitchen BPH (benign prostatic hyperplasia)   . Basal ganglia hemorrhage 2015    a. 2015 - with intraventricular extension and hydrocephalus requiring right crani and VP shunt, complicated by encephalopathy, protein calorie malnutrition, acute resp failure req trache and PEG, BLE DVT s/p IVC, enterobacter PNA, subsequent right hemiparesis and difficulty communicating.  Marland Kitchen Hydrocephalus 2015    a. 2/2 BG hemorrhage.  . Acute respiratory failure 2015    a. Following BG hemorrhage - s/p trache with decannulation.  . Enterobacter cloacae pneumonia 2015  . S/P percutaneous endoscopic gastrostomy (PEG) tube placement 2015  . Protein calorie malnutrition   . LV dysfunction     a. 2011 - EF 45-50%, subsequent 55-60% in 04/2014.  Marland Kitchen Noncompliance    Past Surgical History  Procedure Laterality Date  . Knee  arthroscopy Bilateral 2000's    2 on left and 3 on rt  . Tee without cardioversion  03/21/2012    Procedure: TRANSESOPHAGEAL ECHOCARDIOGRAM (TEE);  Surgeon: Fay Records, MD;  Location: [facility] ENDOSCOPY;  Service: Cardiovascular;  Laterality: N/A;  . Coronary angioplasty with stent placement  2012    "1" (09/24/2013)   Family History  Problem Relation Age of Onset  . Cancer Neg Hx   . Kidney disease Neg Hx   . Diabetes Neg Hx   . Heart attack Sister   . Heart attack Father   . Stroke Neg Hx   . Hypertension Brother    History  Substance Use Topics  . Smoking status: Former  Smoker -- 1.00 packs/day for 35 years    Types: Cigarettes    Quit date: 03/10/2012  . Smokeless tobacco: Never Used  . Alcohol Use: No    Review of Systems  Musculoskeletal: Positive for arthralgias.  All other systems reviewed and are negative.     Allergies  Iodine and Amlodipine  Home Medications   Prior to Admission medications   Medication Sig Start Date End Date Taking? Authorizing Provider  acetaminophen (TYLENOL) 325 MG tablet Take 1-2 tablets (325-650 mg total) by mouth every 4 (four) hours as needed for mild pain or moderate pain. 11/03/14   Dayna N Dunn, PA-C  atorvastatin (LIPITOR) 80 MG tablet Take 1 tablet (80 mg total) by mouth every evening. 11/03/14   Dayna N Dunn, PA-C  bacitracin ointment Apply topically as needed for wound care (To PEG tube every day as needed.). 11/03/14   Dayna N Dunn, PA-C  chlorhexidine (PERIDEX) 0.12 % solution Use as directed 15 mLs in the mouth or throat 2 (two) times daily. 11/03/14   Dayna N Dunn, PA-C  diclofenac sodium (VOLTAREN) 1 % GEL Apply 2 g topically 4 (four) times daily. 09/06/14   Bary Leriche, PA-C  feeding supplement, ENSURE COMPLETE, (ENSURE COMPLETE) LIQD Take 237 mLs by mouth 2 (two) times daily. 09/06/14   Bary Leriche, PA-C  ferrous sulfate 220 (44 FE) MG/5ML solution Give 7.65mL (350mg ) by mouth every day for anemia. 11/03/14   Dayna N Dunn, PA-C  fluticasone (FLONASE) 50 MCG/ACT nasal spray Place 2 sprays into both nostrils 2 (two) times daily.    Historical Provider, MD  furosemide (LASIX) 40 MG tablet Take 1 tablet (40 mg total) by mouth daily. 11/12/14   Burnell Blanks, MD  insulin detemir (LEVEMIR) 100 UNIT/ML injection Inject 0.2 mLs (20 Units total) into the skin 2 (two) times daily. 09/06/14   Ivan Anchors Love, PA-C  lisinopril (PRINIVIL,ZESTRIL) 5 MG tablet Take 1 tablet (5 mg total) by mouth daily. 11/03/14   Dayna N Dunn, PA-C  methylphenidate (RITALIN) 5 MG tablet Take 1 tablet (5 mg total) by mouth 2  (two) times daily. 11/03/14   Dayna N Dunn, PA-C  metoprolol succinate (TOPROL-XL) 50 MG 24 hr tablet Take 1 and a half tablets by mouth twice a day. Take with or immediately following a meal. 11/03/14   Dayna N Dunn, PA-C  nitroGLYCERIN (NITROSTAT) 0.4 MG SL tablet Place 1 tablet (0.4 mg total) under the tongue every 5 (five) minutes as needed for chest pain (up to 3 doses). 11/03/14   Dayna N Dunn, PA-C  pantoprazole (PROTONIX) 40 MG tablet Take 1 tablet (40 mg total) by mouth 2 (two) times daily. 11/03/14   Dayna N Dunn, PA-C  potassium chloride SA (K-DUR,KLOR-CON) 20 MEQ tablet Take  1 tablet (20 mEq total) by mouth 3 (three) times daily. 09/06/14   Bary Leriche, PA-C  saccharomyces boulardii (FLORASTOR) 250 MG capsule Take 250 mg by mouth 2 (two) times daily.    Historical Provider, MD  senna-docusate (SENOKOT-S) 8.6-50 MG per tablet Take 1 tablet by mouth 2 (two) times daily as needed (constipation). 11/03/14   Dayna N Dunn, PA-C  Water For Irrigation, Sterile (FREE WATER) SOLN Place 50 mLs into feeding tube 2 times daily at 12 noon and 4 pm. 09/06/14   Ivan Anchors Love, PA-C   BP 138/97 mmHg  Pulse 87  Temp(Src) 97.8 F (36.6 C) (Oral)  Resp 12  Ht 5\' 10"  (1.778 m)  Wt 280 lb (127.007 kg)  BMI 40.18 kg/m2  SpO2 98%   Physical Exam  Constitutional: He is oriented to person, place, and time. He appears well-developed and well-nourished. No distress.  HENT:  Head: Normocephalic and atraumatic.  Mouth/Throat: Oropharynx is clear and moist.  Eyes: Conjunctivae and EOM are normal. Pupils are equal, round, and reactive to light.  Neck: Normal range of motion. Neck supple.  Cardiovascular: Normal rate, regular rhythm and normal heart sounds.   Pulmonary/Chest: Effort normal and breath sounds normal. No respiratory distress. He has no wheezes.  Abdominal: Soft. Bowel sounds are normal. There is no tenderness. There is no CVA tenderness.  PEG tube in place, no surrounding redness or signs of  infection  Musculoskeletal: Normal range of motion.  Significant pitting edema noted BLE Pain upon palpation of right thigh without noted gross deformities; pelvis stable and non-tender; slight right leg shortening compared with left (may be positional); DP pulse palpable  Neurological: He is alert and oriented to person, place, and time.  AAOx3, answering questions appropriately; equal strength UE and LE bilaterally; CN grossly intact; moves all extremities appropriately without ataxia; no focal neuro deficits or facial asymmetry appreciated  Skin: Skin is warm and dry. He is not diaphoretic.  Psychiatric: He has a normal mood and affect.  Nursing note and vitals reviewed.   ED Course  Procedures (including critical care time) Labs Review Labs Reviewed  COMPREHENSIVE METABOLIC PANEL - Abnormal; Notable for the following:    Total Protein 8.5 (*)    Alkaline Phosphatase 145 (*)    All other components within normal limits  CBC WITH DIFFERENTIAL  PROTIME-INR  I-STAT TROPOININ, ED  TYPE AND SCREEN  ABO/RH    Imaging Review Dg Chest 1 View  11/16/2014   CLINICAL DATA:  Altered mental status, fell today  EXAM: CHEST - 1 VIEW  COMPARISON:  07/24/2014  FINDINGS: VP shunt tubing traverses RIGHT chest.  Enlargement of cardiac silhouette with tortuous aorta.  Pulmonary vascularity mediastinal contours otherwise normal.  Tracheostomy tube seen on previous exam no longer identified.  Lungs clear.  No pleural effusion or pneumothorax.  Bones unremarkable.  IMPRESSION: Enlargement of cardiac silhouette.  No acute abnormalities.   Electronically Signed   By: Lavonia Dana M.D.   On: 11/16/2014 21:40   Dg Pelvis 1-2 Views  11/16/2014   CLINICAL DATA:  [place] today, RIGHT and LEFT leg pain  EXAM: PELVIS - 1-2 VIEW  COMPARISON:  None  FINDINGS: VP shunt tubing noted in RIGHT abdomen and pelvis.  Bones demineralized.  Hip and SI joint spaces preserved.  No acute fracture, dislocation or bone destruction.   IMPRESSION: No acute osseous abnormalities.   Electronically Signed   By: Lavonia Dana M.D.   On: 11/16/2014 21:40  Dg Femur Left  11/16/2014   CLINICAL DATA:  Fall today.  Altered mental status.  Left leg pain.  EXAM: LEFT FEMUR - 2 VIEW  COMPARISON:  Left knee 10/13/2012  FINDINGS: Degenerative changes demonstrated in the left hip and left knee. No evidence of acute fracture or dislocation. No focal bone lesion or bone destruction.  IMPRESSION: Degenerative changes in the left hip and left knee. No acute bony abnormalities.   Electronically Signed   By: Lucienne Capers M.D.   On: 11/16/2014 21:41   Dg Femur Right  11/16/2014   CLINICAL DATA:  Fall today. Right leg pain. Left leg pain. Altered mental status.  EXAM: RIGHT FEMUR - 2 VIEW  COMPARISON:  None.  FINDINGS: Degenerative changes in the right hip. Degenerative changes in the right knee. There is no evidence of fracture or other focal bone lesions. Soft tissues are unremarkable.  IMPRESSION: Degenerative changes in the right hip and right knee. No acute bony abnormalities.   Electronically Signed   By: Lucienne Capers M.D.   On: 11/16/2014 21:40   Ct Head Wo Contrast  11/16/2014   CLINICAL DATA:  Unwitnessed fall at home today, found on floor at 1800 hr, history stroke, hypertension, coronary artery disease, TIA, type 2 diabetes  EXAM: CT HEAD WITHOUT CONTRAST  CT CERVICAL SPINE WITHOUT CONTRAST  TECHNIQUE: Multidetector CT imaging of the head and cervical spine was performed following the standard protocol without intravenous contrast. Multiplanar CT image reconstructions of the cervical spine were also generated.  COMPARISON:  CT head 07/12/2014  FINDINGS: CT HEAD FINDINGS  VP shunt tubing via RIGHT frontal approach with tip in RIGHT lateral ventricle at foramina [place].  Normal ventricular morphology unchanged from previous study.  No midline shift or mass effect.  Otherwise normal appearance of brain parenchyma.  No intracranial hemorrhage,  mass lesion or evidence acute infarction.  No extra-axial fluid collections.  Bones and sinuses unremarkable.  CT CERVICAL SPINE FINDINGS  Visualized skullbase intact.  Prevertebral soft tissues normal thickness.  Minimal scattered disc space narrowing.  Vertebral body heights maintained.  No acute fracture, subluxation or bone destruction.  Lung apices clear.  IMPRESSION: Stable VP shunt.  No acute intracranial abnormalities.  No acute cervical spine abnormalities.   Electronically Signed   By: Lavonia Dana M.D.   On: 11/16/2014 22:52   Ct Cervical Spine Wo Contrast  11/16/2014   CLINICAL DATA:  Unwitnessed fall at home today, found on floor at 1800 hr, history stroke, hypertension, coronary artery disease, TIA, type 2 diabetes  EXAM: CT HEAD WITHOUT CONTRAST  CT CERVICAL SPINE WITHOUT CONTRAST  TECHNIQUE: Multidetector CT imaging of the head and cervical spine was performed following the standard protocol without intravenous contrast. Multiplanar CT image reconstructions of the cervical spine were also generated.  COMPARISON:  CT head 07/12/2014  FINDINGS: CT HEAD FINDINGS  VP shunt tubing via RIGHT frontal approach with tip in RIGHT lateral ventricle at foramina [place].  Normal ventricular morphology unchanged from previous study.  No midline shift or mass effect.  Otherwise normal appearance of brain parenchyma.  No intracranial hemorrhage, mass lesion or evidence acute infarction.  No extra-axial fluid collections.  Bones and sinuses unremarkable.  CT CERVICAL SPINE FINDINGS  Visualized skullbase intact.  Prevertebral soft tissues normal thickness.  Minimal scattered disc space narrowing.  Vertebral body heights maintained.  No acute fracture, subluxation or bone destruction.  Lung apices clear.  IMPRESSION: Stable VP shunt.  No acute intracranial abnormalities.  No  acute cervical spine abnormalities.   Electronically Signed   By: Lavonia Dana M.D.   On: 11/16/2014 22:52     EKG  Interpretation   Date/Time:  Tuesday November 16 2014 18:52:32 EST Ventricular Rate:  85 PR Interval:  146 QRS Duration: 96 QT Interval:  397 QTC Calculation: 472 R Axis:   46 Text Interpretation:  Sinus rhythm Minimal ST depression, inferior leads  similar to prior Confirmed by [facility]  MD, TREY ([id number]) on 11/16/2014  7:21:28 PM      MDM   Final diagnoses:  Fall  Right leg pain   53 year old male with multiple comorbidities presenting to the ED following a mechanical fall. He was not using his walker at the time of fall. He notes his injury without loss of consciousness. On exam, he has no visible signs of head trauma. He has some tenderness of his right thigh without gross deformity. His leg remains neurovascularly intact.  Will obtain CT head/CS, CXR, femur and pelvis films.  Lab work pending.  9:19 PM Notified by radiology that patient adamant that it is his left femur that is hurting.  They will add on left femur films.  Imaging reviewed-- negative for acute findings.  Lab work reassuring.  Patient was able to get out of bed and ambulate with walker in ED which is his baseline.  He states minimal pain at this time.  He remains baseline oriented without focal deficits.  Wife has arrived at bedside, comfortable with discharge home at this time.  Will discharge home with short supply pain meds PRN.  Encouraged close FU with PCP.  Discussed plan with patient, he/she acknowledged understanding and agreed with plan of care.  Return precautions given for new or worsening symptoms.  Larene Pickett, PA-C 11/17/14 [id number]  Artis Delay, MD 11/20/14 [phone]

## 2014-11-16 NOTE — ED Notes (Signed)
Pt placed in paper scrubs.

## 2014-11-16 NOTE — ED Notes (Signed)
[facility] called to inquire about xrays.

## 2014-11-16 NOTE — ED Notes (Signed)
Pt brought in by EMS for an unwitnessed fall.  Pt lives at home with family and family reports they left the house at 0600 this morning and returned at 1800 to find pt in the floor.  Pt is c/o right leg pain and swelling.  Pt had 142mcg of Fentanyl PTA

## 2014-11-16 NOTE — ED Notes (Signed)
Pt still in radiology.

## 2014-11-16 NOTE — ED Notes (Signed)
PA at BS.  

## 2014-11-16 NOTE — ED Notes (Signed)
CT called to inquire about scans.

## 2014-11-18 ENCOUNTER — Encounter (HOSPITAL_COMMUNITY): Payer: Self-pay | Admitting: Cardiovascular Disease

## 2014-11-18 DIAGNOSIS — I6911 Cognitive deficits following nontraumatic intracerebral hemorrhage: Secondary | ICD-10-CM | POA: Diagnosis not present

## 2014-11-18 DIAGNOSIS — I2510 Atherosclerotic heart disease of native coronary artery without angina pectoris: Secondary | ICD-10-CM | POA: Diagnosis not present

## 2014-11-18 DIAGNOSIS — E1165 Type 2 diabetes mellitus with hyperglycemia: Secondary | ICD-10-CM | POA: Diagnosis not present

## 2014-11-18 DIAGNOSIS — I10 Essential (primary) hypertension: Secondary | ICD-10-CM | POA: Diagnosis not present

## 2014-11-18 DIAGNOSIS — I69151 Hemiplegia and hemiparesis following nontraumatic intracerebral hemorrhage affecting right dominant side: Secondary | ICD-10-CM | POA: Diagnosis not present

## 2014-11-18 DIAGNOSIS — G4733 Obstructive sleep apnea (adult) (pediatric): Secondary | ICD-10-CM | POA: Diagnosis not present

## 2014-11-19 ENCOUNTER — Telehealth: Payer: Self-pay | Admitting: Cardiovascular Disease

## 2014-11-19 NOTE — Telephone Encounter (Signed)
I spoke with [facility] and pt is scheduled to see Dr. Marlou Starks this afternoon. I spoke with pt's wife and they will be unable be there for this appt.  I gave her phone number for [facility].  She will call their office to reschedule.

## 2014-11-19 NOTE — Telephone Encounter (Signed)
New message      Which doctor did we make an appt for pt to have his feeding tube removed?  They need to resc the appt but do not know the doctor's name

## 2014-11-20 DIAGNOSIS — I6911 Cognitive deficits following nontraumatic intracerebral hemorrhage: Secondary | ICD-10-CM | POA: Diagnosis not present

## 2014-11-20 DIAGNOSIS — I69151 Hemiplegia and hemiparesis following nontraumatic intracerebral hemorrhage affecting right dominant side: Secondary | ICD-10-CM | POA: Diagnosis not present

## 2014-11-23 ENCOUNTER — Encounter: Payer: Self-pay | Admitting: Internal Medicine

## 2014-11-23 DIAGNOSIS — G4733 Obstructive sleep apnea (adult) (pediatric): Secondary | ICD-10-CM | POA: Diagnosis not present

## 2014-11-23 DIAGNOSIS — I6911 Cognitive deficits following nontraumatic intracerebral hemorrhage: Secondary | ICD-10-CM | POA: Diagnosis not present

## 2014-11-23 DIAGNOSIS — E1165 Type 2 diabetes mellitus with hyperglycemia: Secondary | ICD-10-CM | POA: Diagnosis not present

## 2014-11-23 DIAGNOSIS — I69151 Hemiplegia and hemiparesis following nontraumatic intracerebral hemorrhage affecting right dominant side: Secondary | ICD-10-CM | POA: Diagnosis not present

## 2014-11-23 DIAGNOSIS — I2510 Atherosclerotic heart disease of native coronary artery without angina pectoris: Secondary | ICD-10-CM | POA: Diagnosis not present

## 2014-11-23 DIAGNOSIS — I10 Essential (primary) hypertension: Secondary | ICD-10-CM | POA: Diagnosis not present

## 2014-11-24 ENCOUNTER — Other Ambulatory Visit: Payer: Self-pay | Admitting: Internal Medicine

## 2014-11-24 DIAGNOSIS — I69151 Hemiplegia and hemiparesis following nontraumatic intracerebral hemorrhage affecting right dominant side: Secondary | ICD-10-CM | POA: Diagnosis not present

## 2014-11-24 DIAGNOSIS — I10 Essential (primary) hypertension: Secondary | ICD-10-CM | POA: Diagnosis not present

## 2014-11-24 DIAGNOSIS — G919 Hydrocephalus, unspecified: Secondary | ICD-10-CM

## 2014-11-24 DIAGNOSIS — I6911 Cognitive deficits following nontraumatic intracerebral hemorrhage: Secondary | ICD-10-CM | POA: Diagnosis not present

## 2014-11-24 DIAGNOSIS — G4733 Obstructive sleep apnea (adult) (pediatric): Secondary | ICD-10-CM | POA: Diagnosis not present

## 2014-11-24 DIAGNOSIS — E1165 Type 2 diabetes mellitus with hyperglycemia: Secondary | ICD-10-CM | POA: Diagnosis not present

## 2014-11-24 DIAGNOSIS — I69359 Hemiplegia and hemiparesis following cerebral infarction affecting unspecified side: Secondary | ICD-10-CM

## 2014-11-24 DIAGNOSIS — I2510 Atherosclerotic heart disease of native coronary artery without angina pectoris: Secondary | ICD-10-CM | POA: Diagnosis not present

## 2014-11-25 DIAGNOSIS — I10 Essential (primary) hypertension: Secondary | ICD-10-CM | POA: Diagnosis not present

## 2014-11-25 DIAGNOSIS — G4733 Obstructive sleep apnea (adult) (pediatric): Secondary | ICD-10-CM | POA: Diagnosis not present

## 2014-11-25 DIAGNOSIS — E1165 Type 2 diabetes mellitus with hyperglycemia: Secondary | ICD-10-CM | POA: Diagnosis not present

## 2014-11-25 DIAGNOSIS — I6911 Cognitive deficits following nontraumatic intracerebral hemorrhage: Secondary | ICD-10-CM | POA: Diagnosis not present

## 2014-11-25 DIAGNOSIS — I69151 Hemiplegia and hemiparesis following nontraumatic intracerebral hemorrhage affecting right dominant side: Secondary | ICD-10-CM | POA: Diagnosis not present

## 2014-11-25 DIAGNOSIS — I2510 Atherosclerotic heart disease of native coronary artery without angina pectoris: Secondary | ICD-10-CM | POA: Diagnosis not present

## 2014-11-26 DIAGNOSIS — I69151 Hemiplegia and hemiparesis following nontraumatic intracerebral hemorrhage affecting right dominant side: Secondary | ICD-10-CM | POA: Diagnosis not present

## 2014-11-26 DIAGNOSIS — I6911 Cognitive deficits following nontraumatic intracerebral hemorrhage: Secondary | ICD-10-CM | POA: Diagnosis not present

## 2014-11-26 DIAGNOSIS — I10 Essential (primary) hypertension: Secondary | ICD-10-CM | POA: Diagnosis not present

## 2014-11-26 DIAGNOSIS — G4733 Obstructive sleep apnea (adult) (pediatric): Secondary | ICD-10-CM | POA: Diagnosis not present

## 2014-11-26 DIAGNOSIS — E1165 Type 2 diabetes mellitus with hyperglycemia: Secondary | ICD-10-CM | POA: Diagnosis not present

## 2014-11-26 DIAGNOSIS — I2510 Atherosclerotic heart disease of native coronary artery without angina pectoris: Secondary | ICD-10-CM | POA: Diagnosis not present

## 2014-11-30 ENCOUNTER — Emergency Department (HOSPITAL_COMMUNITY): Payer: BC Managed Care – PPO

## 2014-11-30 ENCOUNTER — Encounter (HOSPITAL_COMMUNITY): Payer: Self-pay | Admitting: Cardiology

## 2014-11-30 ENCOUNTER — Inpatient Hospital Stay (HOSPITAL_COMMUNITY)
Admission: EM | Admit: 2014-11-30 | Discharge: 2014-12-02 | DRG: 689 | Disposition: A | Discharge status: Home Health | Payer: BC Managed Care – PPO | Attending: Internal Medicine | Admitting: Internal Medicine

## 2014-11-30 ENCOUNTER — Other Ambulatory Visit: Payer: Self-pay | Admitting: Cardiovascular Disease

## 2014-11-30 DIAGNOSIS — E46 Unspecified protein-calorie malnutrition: Secondary | ICD-10-CM | POA: Diagnosis present

## 2014-11-30 DIAGNOSIS — E118 Type 2 diabetes mellitus with unspecified complications: Secondary | ICD-10-CM | POA: Diagnosis not present

## 2014-11-30 DIAGNOSIS — E876 Hypokalemia: Secondary | ICD-10-CM | POA: Diagnosis present

## 2014-11-30 DIAGNOSIS — F0390 Unspecified dementia without behavioral disturbance: Secondary | ICD-10-CM | POA: Diagnosis present

## 2014-11-30 DIAGNOSIS — Z9119 Patient's noncompliance with other medical treatment and regimen: Secondary | ICD-10-CM | POA: Diagnosis present

## 2014-11-30 DIAGNOSIS — I2510 Atherosclerotic heart disease of native coronary artery without angina pectoris: Secondary | ICD-10-CM | POA: Diagnosis not present

## 2014-11-30 DIAGNOSIS — N390 Urinary tract infection, site not specified: Secondary | ICD-10-CM | POA: Diagnosis not present

## 2014-11-30 DIAGNOSIS — J9600 Acute respiratory failure, unspecified whether with hypoxia or hypercapnia: Secondary | ICD-10-CM | POA: Diagnosis present

## 2014-11-30 DIAGNOSIS — R5381 Other malaise: Secondary | ICD-10-CM

## 2014-11-30 DIAGNOSIS — D179 Benign lipomatous neoplasm, unspecified: Secondary | ICD-10-CM | POA: Diagnosis present

## 2014-11-30 DIAGNOSIS — I10 Essential (primary) hypertension: Secondary | ICD-10-CM | POA: Diagnosis present

## 2014-11-30 DIAGNOSIS — G919 Hydrocephalus, unspecified: Secondary | ICD-10-CM | POA: Diagnosis present

## 2014-11-30 DIAGNOSIS — R413 Other amnesia: Secondary | ICD-10-CM

## 2014-11-30 DIAGNOSIS — M069 Rheumatoid arthritis, unspecified: Secondary | ICD-10-CM | POA: Diagnosis present

## 2014-11-30 DIAGNOSIS — E669 Obesity, unspecified: Secondary | ICD-10-CM | POA: Diagnosis present

## 2014-11-30 DIAGNOSIS — E119 Type 2 diabetes mellitus without complications: Secondary | ICD-10-CM | POA: Diagnosis present

## 2014-11-30 DIAGNOSIS — E785 Hyperlipidemia, unspecified: Secondary | ICD-10-CM | POA: Diagnosis present

## 2014-11-30 DIAGNOSIS — R4182 Altered mental status, unspecified: Secondary | ICD-10-CM | POA: Diagnosis not present

## 2014-11-30 DIAGNOSIS — I5032 Chronic diastolic (congestive) heart failure: Secondary | ICD-10-CM | POA: Diagnosis not present

## 2014-11-30 DIAGNOSIS — M1990 Unspecified osteoarthritis, unspecified site: Secondary | ICD-10-CM | POA: Diagnosis present

## 2014-11-30 DIAGNOSIS — R32 Unspecified urinary incontinence: Secondary | ICD-10-CM | POA: Diagnosis present

## 2014-11-30 DIAGNOSIS — Z86718 Personal history of other venous thrombosis and embolism: Secondary | ICD-10-CM | POA: Diagnosis not present

## 2014-11-30 DIAGNOSIS — R609 Edema, unspecified: Secondary | ICD-10-CM

## 2014-11-30 DIAGNOSIS — G4733 Obstructive sleep apnea (adult) (pediatric): Secondary | ICD-10-CM | POA: Diagnosis present

## 2014-11-30 DIAGNOSIS — R296 Repeated falls: Secondary | ICD-10-CM | POA: Diagnosis not present

## 2014-11-30 DIAGNOSIS — R42 Dizziness and giddiness: Secondary | ICD-10-CM | POA: Insufficient documentation

## 2014-11-30 DIAGNOSIS — W19XXXA Unspecified fall, initial encounter: Secondary | ICD-10-CM | POA: Diagnosis present

## 2014-11-30 DIAGNOSIS — Z931 Gastrostomy status: Secondary | ICD-10-CM | POA: Diagnosis not present

## 2014-11-30 DIAGNOSIS — Z982 Presence of cerebrospinal fluid drainage device: Secondary | ICD-10-CM

## 2014-11-30 DIAGNOSIS — Z6837 Body mass index (BMI) 37.0-37.9, adult: Secondary | ICD-10-CM

## 2014-11-30 DIAGNOSIS — R600 Localized edema: Secondary | ICD-10-CM

## 2014-11-30 DIAGNOSIS — R601 Generalized edema: Secondary | ICD-10-CM | POA: Insufficient documentation

## 2014-11-30 DIAGNOSIS — N400 Enlarged prostate without lower urinary tract symptoms: Secondary | ICD-10-CM | POA: Diagnosis present

## 2014-11-30 DIAGNOSIS — Z8673 Personal history of transient ischemic attack (TIA), and cerebral infarction without residual deficits: Secondary | ICD-10-CM

## 2014-11-30 DIAGNOSIS — K219 Gastro-esophageal reflux disease without esophagitis: Secondary | ICD-10-CM | POA: Diagnosis not present

## 2014-11-30 DIAGNOSIS — R531 Weakness: Secondary | ICD-10-CM | POA: Diagnosis present

## 2014-11-30 DIAGNOSIS — Z87891 Personal history of nicotine dependence: Secondary | ICD-10-CM | POA: Diagnosis not present

## 2014-11-30 DIAGNOSIS — G9340 Encephalopathy, unspecified: Secondary | ICD-10-CM | POA: Diagnosis present

## 2014-11-30 DIAGNOSIS — Z955 Presence of coronary angioplasty implant and graft: Secondary | ICD-10-CM

## 2014-11-30 DIAGNOSIS — R7303 Prediabetes: Secondary | ICD-10-CM | POA: Diagnosis present

## 2014-11-30 HISTORY — DX: Unspecified fall, initial encounter: W19.XXXA

## 2014-11-30 HISTORY — DX: Full incontinence of feces: R15.9

## 2014-11-30 HISTORY — DX: Unspecified urinary incontinence: R32

## 2014-11-30 HISTORY — DX: Repeated falls: R29.6

## 2014-11-30 LAB — CBC
HEMATOCRIT: 36.2 % — AB (ref 39.0–52.0)
Hemoglobin: 11.4 g/dL — ABNORMAL LOW (ref 13.0–17.0)
MCH: 27.2 pg (ref 26.0–34.0)
MCHC: 31.5 g/dL (ref 30.0–36.0)
MCV: 86.4 fL (ref 78.0–100.0)
PLATELETS: 314 10*3/uL (ref 150–400)
RBC: 4.19 MIL/uL — ABNORMAL LOW (ref 4.22–5.81)
RDW: 14.3 % (ref 11.5–15.5)
WBC: 7.6 10*3/uL (ref 4.0–10.5)

## 2014-11-30 LAB — URINE MICROSCOPIC-ADD ON

## 2014-11-30 LAB — HEPATIC FUNCTION PANEL
ALBUMIN: 4.1 g/dL (ref 3.5–5.2)
ALK PHOS: 135 U/L — AB (ref 39–117)
ALT: 19 U/L (ref 0–53)
AST: 26 U/L (ref 0–37)
BILIRUBIN DIRECT: 0.2 mg/dL (ref 0.0–0.3)
Indirect Bilirubin: 0.5 mg/dL (ref 0.3–0.9)
Total Bilirubin: 0.7 mg/dL (ref 0.3–1.2)
Total Protein: 7.1 g/dL (ref 6.0–8.3)

## 2014-11-30 LAB — URINALYSIS, ROUTINE W REFLEX MICROSCOPIC
Bilirubin Urine: NEGATIVE
Glucose, UA: NEGATIVE mg/dL
Ketones, ur: NEGATIVE mg/dL
Nitrite: NEGATIVE
Protein, ur: 30 mg/dL — AB
SPECIFIC GRAVITY, URINE: 1.017 (ref 1.005–1.030)
UROBILINOGEN UA: 0.2 mg/dL (ref 0.0–1.0)
pH: 5.5 (ref 5.0–8.0)

## 2014-11-30 LAB — BASIC METABOLIC PANEL
ANION GAP: 10 (ref 5–15)
BUN: 16 mg/dL (ref 6–23)
CO2: 27 mmol/L (ref 19–32)
CREATININE: 1.25 mg/dL (ref 0.50–1.35)
Calcium: 9.6 mg/dL (ref 8.4–10.5)
Chloride: 102 mEq/L (ref 96–112)
GFR calc Af Amer: 75 mL/min — ABNORMAL LOW (ref >90)
GFR, EST NON AFRICAN AMERICAN: 65 mL/min — AB (ref >90)
Glucose, Bld: 164 mg/dL — ABNORMAL HIGH (ref 70–99)
POTASSIUM: 3.6 mmol/L (ref 3.5–5.1)
Sodium: 139 mmol/L (ref 135–145)

## 2014-11-30 LAB — I-STAT TROPONIN, ED: Troponin i, poc: 0.01 ng/mL (ref 0.00–0.08)

## 2014-11-30 LAB — BRAIN NATRIURETIC PEPTIDE: B Natriuretic Peptide: 25.8 pg/mL (ref 0.0–100.0)

## 2014-11-30 LAB — PROTIME-INR
INR: 1.1 (ref 0.00–1.49)
PROTHROMBIN TIME: 14.3 s (ref 11.6–15.2)

## 2014-11-30 LAB — CK: CK TOTAL: 762 U/L — AB (ref 7–232)

## 2014-11-30 MED ORDER — METOPROLOL TARTRATE 1 MG/ML IV SOLN
2.5000 mg | Freq: Four times a day (QID) | INTRAVENOUS | Status: DC | PRN
  Administered 2014-11-30: 2.5 mg via INTRAVENOUS
  Filled 2014-11-30 (×2): qty 5

## 2014-11-30 MED ORDER — ONDANSETRON HCL 4 MG PO TABS: 4.0000 mg | ORAL_TABLET | Freq: Four times a day (QID) | ORAL | Status: DC | PRN

## 2014-11-30 MED ORDER — HYDROCODONE-ACETAMINOPHEN 5-325 MG PO TABS: 1.0000 | ORAL_TABLET | ORAL | Status: DC | PRN

## 2014-11-30 MED ORDER — PANTOPRAZOLE SODIUM 40 MG PO TBEC
40.0000 mg | DELAYED_RELEASE_TABLET | Freq: Two times a day (BID) | ORAL | Status: DC
  Administered 2014-12-01 – 2014-12-02 (×3): 40 mg via ORAL
  Filled 2014-11-30 (×3): qty 1

## 2014-11-30 MED ORDER — ONDANSETRON HCL 4 MG/2ML IJ SOLN: 4.0000 mg | Freq: Four times a day (QID) | INTRAMUSCULAR | Status: DC | PRN

## 2014-11-30 MED ORDER — POTASSIUM CHLORIDE CRYS ER 20 MEQ PO TBCR
20.0000 meq | EXTENDED_RELEASE_TABLET | Freq: Two times a day (BID) | ORAL | Status: DC
  Administered 2014-12-01 – 2014-12-02 (×3): 20 meq via ORAL
  Filled 2014-11-30 (×4): qty 1

## 2014-11-30 MED ORDER — FUROSEMIDE 40 MG PO TABS
40.0000 mg | ORAL_TABLET | Freq: Every day | ORAL | Status: DC
  Administered 2014-12-01 – 2014-12-02 (×2): 40 mg via ORAL
  Filled 2014-11-30 (×3): qty 1

## 2014-11-30 MED ORDER — CHLORHEXIDINE GLUCONATE 0.12 % MT SOLN
15.0000 mL | Freq: Two times a day (BID) | OROMUCOSAL | Status: DC
  Administered 2014-11-30 – 2014-12-02 (×4): 15 mL via OROMUCOSAL
  Filled 2014-11-30 (×6): qty 15

## 2014-11-30 MED ORDER — SODIUM CHLORIDE 0.9 % IV SOLN
INTRAVENOUS | Status: DC
  Administered 2014-11-30 – 2014-12-02 (×4): via INTRAVENOUS

## 2014-11-30 MED ORDER — METHYLPHENIDATE HCL 5 MG PO TABS
5.0000 mg | ORAL_TABLET | Freq: Two times a day (BID) | ORAL | Status: DC
  Administered 2014-12-01 – 2014-12-02 (×3): 5 mg via ORAL
  Filled 2014-11-30 (×3): qty 1

## 2014-11-30 MED ORDER — METOPROLOL SUCCINATE ER 100 MG PO TB24
100.0000 mg | ORAL_TABLET | Freq: Every day | ORAL | Status: DC
  Administered 2014-12-01 – 2014-12-02 (×2): 100 mg via ORAL
  Filled 2014-11-30 (×2): qty 1

## 2014-11-30 MED ORDER — PNEUMOCOCCAL VAC POLYVALENT 25 MCG/0.5ML IJ INJ
0.5000 mL | INJECTION | INTRAMUSCULAR | Status: AC
  Administered 2014-12-01: 0.5 mL via INTRAMUSCULAR
  Filled 2014-11-30: qty 0.5

## 2014-11-30 MED ORDER — ATORVASTATIN CALCIUM 80 MG PO TABS
80.0000 mg | ORAL_TABLET | Freq: Every evening | ORAL | Status: DC
  Administered 2014-12-01 – 2014-12-02 (×2): 80 mg via ORAL
  Filled 2014-11-30 (×3): qty 1

## 2014-11-30 MED ORDER — HEPARIN SODIUM (PORCINE) 5000 UNIT/ML IJ SOLN
5000.0000 [IU] | Freq: Three times a day (TID) | INTRAMUSCULAR | Status: DC
  Administered 2014-11-30 – 2014-12-02 (×6): 5000 [IU] via SUBCUTANEOUS
  Filled 2014-11-30 (×8): qty 1

## 2014-11-30 MED ORDER — DEXTROSE 5 % IV SOLN
1.0000 g | Freq: Once | INTRAVENOUS | Status: AC
  Administered 2014-11-30: 1 g via INTRAVENOUS
  Filled 2014-11-30: qty 10

## 2014-11-30 MED ORDER — CEPHALEXIN 250 MG/5ML PO SUSR
500.0000 mg | Freq: Three times a day (TID) | ORAL | Status: DC
  Administered 2014-12-01 – 2014-12-02 (×4): 500 mg via ORAL
  Filled 2014-11-30 (×8): qty 10

## 2014-11-30 MED ORDER — LISINOPRIL 5 MG PO TABS
5.0000 mg | ORAL_TABLET | Freq: Every day | ORAL | Status: DC
  Administered 2014-12-01 – 2014-12-02 (×2): 5 mg via ORAL
  Filled 2014-11-30 (×3): qty 1

## 2014-11-30 MED ORDER — ENSURE COMPLETE PO LIQD
237.0000 mL | Freq: Two times a day (BID) | ORAL | Status: DC
  Administered 2014-12-01: 237 mL via ORAL

## 2014-11-30 NOTE — H&P (Addendum)
[facility] History and Physical  KEE DRUDGE KKX:[id number] DOB: November 24, 1961 DOA: 11/30/2014  Referring physician: Dr Betsey Holiday - [facility] PCP: [name], MD   Chief Complaint: Fall   HPI: Tony Davis is a 53 y.o. male  Pt found sitting on side of bed at 03:00. Wife states this is nml for pt. Wife went to check on pt again at 04:30 and pt was on the floor. Pt denies hitting head or syncope but unable to give reliable history. Wife states that pt has had 2 similar episodes since leaving rehab in September. Wife and daughter managed to get pt back into bed around 07:30. Pt unable to assist in getting back to bed. States he is weak  LEVEL 5 CAVEAT - PT W/ BASELINE ALTERED MENTAL STATUS SECONDARY TO OLD STROKE  Wife present for admission and endorses that pt is at his baseline for cognitive bahavior, LE swelling and incontinence. States he is getting harder to manage at home due to pts weakness and memory issues. Pt has been in steady decline since DC from inpt rehab    Review of Systems:  Pt unable to reliably endorse symptoms    Past Medical History  Diagnosis Date  . GERD (gastroesophageal reflux disease)   . HLD (hyperlipidemia)   . HTN (hypertension)   . Hematuria 8/12  . PFO (patent foramen ovale)     a. echo 8/12: EF 55-65%, grade 2 diast dysfnx; +PFO on bubble study. b. TEE 4/13: EF normal, atrial septum with suspicion for interatrial septum fenestrations without flow across and few large bubbles noted in LA.  This was not felt to require closure   . CAD (coronary artery disease)     a. s/p promus DES circumflex artery 10/13/10. b. Cath 04/2013: stable disease but possible small vessel disease -Imdur added.  Marland Kitchen DVT (deep venous thrombosis) 2013    a. 2013. b. 2015 - reported BLE DVT s/p IVC filter.   Marland Kitchen TIA (transient ischemic attack)     "1-2; both after the one in 02/2010" (09/25/2013)  . Type II diabetes mellitus   . Arthritis     "knees" (09/25/2013)  .  Rheumatoid arthritis(714.0)   . Folate deficiency anemia   . Carotid artery disease     a. 1-29% BICA by study 04/2014.  Marland Kitchen BPH (benign prostatic hyperplasia)   . Basal ganglia hemorrhage 2015    a. 2015 - with intraventricular extension and hydrocephalus requiring right crani and VP shunt, complicated by encephalopathy, protein calorie malnutrition, acute resp failure req trache and PEG, BLE DVT s/p IVC, enterobacter PNA, subsequent right hemiparesis and difficulty communicating.  Marland Kitchen Hydrocephalus 2015    a. 2/2 BG hemorrhage.  . Acute respiratory failure 2015    a. Following BG hemorrhage - s/p trache with decannulation.  . Enterobacter cloacae pneumonia 2015  . S/P percutaneous endoscopic gastrostomy (PEG) tube placement 2015  . Protein calorie malnutrition   . LV dysfunction     a. 2011 - EF 45-50%, subsequent 55-60% in 04/2014.  Marland Kitchen Noncompliance   . Falls   . Urinary incontinence   . CVA (cerebral vascular accident) 02/2010    a. CVA 2011, prior reported TIAs. b. CNS hemorrhage 2015.  Marland Kitchen CVA (cerebral vascular accident) 06/2014    "hemorrhagic; in [place]; brain activity diminshed; ability to do anything; can't do ADLs on own"  . OSA on CPAP     "not using CPAP right now" (11/30/2014)  . Incontinence of feces  Past Surgical History  Procedure Laterality Date  . Knee arthroscopy Bilateral 2000's    2 on left and 3 on rt  . Tee without cardioversion  03/21/2012    Procedure: TRANSESOPHAGEAL ECHOCARDIOGRAM (TEE);  Surgeon: Fay Records, MD;  Location: [facility] ENDOSCOPY;  Service: Cardiovascular;  Laterality: N/A;  . Coronary angioplasty with stent placement  2012    "1" (09/24/2013)  . Left heart catheterization with coronary angiogram N/A 04/24/2013    Procedure: LEFT HEART CATHETERIZATION WITH CORONARY ANGIOGRAM;  Surgeon: Burnell Blanks, MD;  Location: [facility] CATH LAB;  Service: Cardiovascular;  Laterality: N/A;   Social History:  reports that he quit smoking about 2 years ago. His  smoking use included Cigarettes. He has a 35 pack-year smoking history. He has never used smokeless tobacco. He reports that he does not drink alcohol or use illicit drugs.  Allergies  Allergen Reactions  . Iodine Anaphylaxis  . Amlodipine Swelling and Other (See Comments)    Foot edema    Family History  Problem Relation Age of Onset  . Cancer Neg Hx   . Kidney disease Neg Hx   . Diabetes Neg Hx   . Heart attack Sister   . Heart attack Father   . Stroke Neg Hx   . Hypertension Brother      Prior to Admission medications   Medication Sig Start Date End Date Taking? Authorizing Provider  acetaminophen (TYLENOL) 325 MG tablet Take 1-2 tablets (325-650 mg total) by mouth every 4 (four) hours as needed for mild pain or moderate pain. 11/03/14   Dayna N Dunn, PA-C  atorvastatin (LIPITOR) 80 MG tablet Take 1 tablet (80 mg total) by mouth every evening. 11/03/14   Dayna N Dunn, PA-C  chlorhexidine (PERIDEX) 0.12 % solution Use as directed 15 mLs in the mouth or throat 2 (two) times daily. 11/03/14   Dayna N Dunn, PA-C  feeding supplement, ENSURE COMPLETE, (ENSURE COMPLETE) LIQD Take 237 mLs by mouth 2 (two) times daily. 09/06/14   Bary Leriche, PA-C  ferrous sulfate 220 (44 FE) MG/5ML solution Give 7.44mL (350mg ) by mouth every day for anemia. 11/03/14   Dayna N Dunn, PA-C  fluticasone (FLONASE) 50 MCG/ACT nasal spray Place 2 sprays into both nostrils 2 (two) times daily.    Historical Provider, MD  furosemide (LASIX) 40 MG tablet Take 1 tablet (40 mg total) by mouth daily. 11/12/14   Burnell Blanks, MD  HYDROcodone-acetaminophen (NORCO/VICODIN) 5-325 MG per tablet Take 1 tablet by mouth every 4 (four) hours as needed. 11/16/14   Larene Pickett, PA-C  lisinopril (PRINIVIL,ZESTRIL) 5 MG tablet Take 1 tablet (5 mg total) by mouth daily. 11/03/14   Dayna N Dunn, PA-C  methylphenidate (RITALIN) 5 MG tablet Take 1 tablet (5 mg total) by mouth 2 (two) times daily. 11/03/14   Dayna N Dunn, PA-C   metoprolol succinate (TOPROL-XL) 50 MG 24 hr tablet Take 1 and a half tablets by mouth twice a day. Take with or immediately following a meal. Patient taking differently: Take 75 mg by mouth 2 (two) times daily. Take 1 and a half tablets by mouth twice a day. Take with or immediately following a meal. 11/03/14   Dayna N Dunn, PA-C  nitroGLYCERIN (NITROSTAT) 0.4 MG SL tablet Place 1 tablet (0.4 mg total) under the tongue every 5 (five) minutes as needed for chest pain (up to 3 doses). 11/03/14   Dayna N Dunn, PA-C  pantoprazole (PROTONIX) 40 MG tablet Take 1  tablet (40 mg total) by mouth 2 (two) times daily. 11/03/14   Dayna N Dunn, PA-C  potassium chloride SA (K-DUR,KLOR-CON) 20 MEQ tablet Take 1 tablet (20 mEq total) by mouth 3 (three) times daily. Patient taking differently: Take 20 mEq by mouth 2 (two) times daily.  09/06/14   Bary Leriche, PA-C  saccharomyces boulardii (FLORASTOR) 250 MG capsule Take 250 mg by mouth 2 (two) times daily.    Historical Provider, MD  senna-docusate (SENOKOT-S) 8.6-50 MG per tablet Take 1 tablet by mouth 2 (two) times daily as needed (constipation). 11/03/14   Charlie Pitter, PA-C  Water For Irrigation, Sterile (FREE WATER) SOLN Place 50 mLs into feeding tube 2 times daily at 12 noon and 4 pm. 09/06/14   Bary Leriche, PA-C   Physical Exam: Filed Vitals:   11/30/14 1845 11/30/14 1900 11/30/14 1930 11/30/14 2030  BP: 152/81 160/78 141/93 157/105  Pulse: 81 76 93   Temp:    98.3 F (36.8 C)  TempSrc:    Oral  Resp: 15 13 12 15   Height:      Weight:    131.276 kg (289 lb 6.6 oz)  SpO2: 92% 96% 97% 100%    Wt Readings from Last 3 Encounters:  11/30/14 131.276 kg (289 lb 6.6 oz)  11/16/14 127.007 kg (280 lb)  11/12/14 132.904 kg (293 lb)    General:  Appears calm and comfortable Eyes:  PERRL, normal lids, irises & conjunctiva ENT:  grossly normal hearing, lips & tongue Neck:  no LAD, masses or thyromegaly Cardiovascular:  RRR, no m/r/g. 2+ Pitting edema  to the elvel of the knees.  Telemetry:  SR, no arrhythmias  Respiratory:  CTA bilaterally, no w/r/r. Normal respiratory effort. Abdomen:  soft, ntnd, PEG tube in place w/o discharge or induration.  Skin:  no rash or induration seen on limited exam Musculoskeletal:  grossly normal tone BUE/BLE Psychiatric: Pt Alert and oriented to person only Neurologic: Moves all extremities w/o difficulty. Follows commands.          Labs on Admission:  Basic Metabolic Panel:  Recent Labs Lab 11/30/14 1241  NA 139  K 3.6  CL 102  CO2 27  GLUCOSE 164*  BUN 16  CREATININE 1.25  CALCIUM 9.6   Liver Function Tests:  Recent Labs Lab 11/30/14 1630  AST 26  ALT 19  ALKPHOS 135*  BILITOT 0.7  PROT 7.1  ALBUMIN 4.1   No results for input(s): LIPASE, AMYLASE in the last 168 hours. No results for input(s): AMMONIA in the last 168 hours. CBC:  Recent Labs Lab 11/30/14 1241  WBC 7.6  HGB 11.4*  HCT 36.2*  MCV 86.4  PLT 314   Cardiac Enzymes:  Recent Labs Lab 11/30/14 1544  CKTOTAL 762*    BNP (last 3 results) No results for input(s): PROBNP in the last 8760 hours. CBG: No results for input(s): GLUCAP in the last 168 hours.  Radiological Exams on Admission: Dg Chest 2 View  11/30/2014   CLINICAL DATA:  Dizziness, edema  EXAM: CHEST  2 VIEW  COMPARISON:  11/16/2014  FINDINGS: Cardiomegaly again noted. The study is limited by patient's large body habitus. Chronic elevation of the right hemidiaphragm. No acute infiltrate or pulmonary edema. Right VP shunt catheter again noted.  IMPRESSION: Cardiomegaly again noted. No acute infiltrate or pulmonary edema. Chronic elevation of the right hemidiaphragm. Limited study by patient's large body habitus.   Electronically Signed   By: Orlean Bradford.D.  On: 11/30/2014 16:00   Ct Head Wo Contrast  11/30/2014   CLINICAL DATA:  53 year old male with recent falls and altered mental status. Initial encounter.  EXAM: CT HEAD WITHOUT CONTRAST   TECHNIQUE: Contiguous axial images were obtained from the base of the skull through the vertex without intravenous contrast.  COMPARISON:  Head CT 11/16/2014 and earlier.  FINDINGS: Stable paranasal sinuses and mastoids. Sequelae of right frontal approach ventriculostomy and shunt placement again noted. Stable appearance of the reservoir and visible shunt tubing. No acute scalp soft tissue finding. Visualized orbit soft tissues are within normal limits. No acute osseous abnormality identified.  Stable ventriculostomy catheter, terminating at the body of the right lateral ventricle. Stable ventricle size and configuration. , the right lateral ventricle remains decompressed.  Incidental small left tentorial lipoma. No midline shift, mass effect, or evidence of intracranial mass lesion. Scattered subcortical white matter hypodensity is stable, nonspecific. No acute intracranial hemorrhage identified. No evidence of cortically based acute infarction identified. No suspicious intracranial vascular hyperdensity.  IMPRESSION: No acute intracranial abnormality.  Stable right frontal approach CSF shunt and ventricle configuration. Nonspecific white matter changes.   Electronically Signed   By: Lars Pinks M.D.   On: 11/30/2014 16:13    EKG: Independently reviewed. NSR, no change from previous, no sign of ACS  Assessment/Plan Principal Problem:   Physical deconditioning Active Problems:   CAD, NATIVE VESSEL   GERD   Type II diabetes mellitus with manifestations   S/P percutaneous endoscopic gastrostomy (PEG) tube placement   Essential hypertension   Bilateral leg edema   Memory loss   UTI (urinary tract infection)   Diastolic dysfunction with chronic heart failure  Physical Deconditioning: Pt becoming progressively weaker since leaving inpt rehab. Pt has fallen twice since leaving rehab. Both times unwitnessed and pt previously seen sitting on edge of bed then found later at side of bed. Family unable to care  for pt at home. Pt is tolerating po and family has not accessed his PEG since September. Pt scheduled to meet w/ surgery for removal later this month. No sign of acute worsening of neurological status so unlikey that pt has restroked. CT head w/o acute process and stable CSF shunt - Admit - Tele - PT/OT - CSW - plcmt/home health - COnsult Gen surgery for Peg Removal - Nutrition consult - Bedisde swallow prior to diet  UTI: urine suspiscious. Pt baseline incontinent. Given weakness and inability to give reliable history will treat.  - Keflex 500 TID - UCX  Long lie syndrome: CK 762, not to the level of Rhabdo.  - IVF NS 174ml/hr - CK in am  Chronic diastolic dysfunction: no acute exacervbation. Cardiomegaly on CXR w/o effusion. BNP 25.8. Echo 11/10/14 w/ EF 65-70% adn Grade 2 diastolic dysfunction - Monitor given hydration - continue lasix, bblocker and ACEi  HTN: elevated but stable - Continue lisinopril, metoprolol,   GERD:  - continue PPI  Memory loss: pt w/ poor memory and oriented to person only. Baseline per wife. Pt this way since stroke. Basal ganglia hemorrhage. Pt is unable to care for self at home per family as he is very confused at baseline.  - continue home Ritalin (??)     Code Status: FULL DVT Prophylaxis: Hep Family Communication: Wife  Disposition Plan: Pending improvement   Skarleth Del[place], [employer] [facility] [email or web address] Password [email or web address]

## 2014-11-30 NOTE — Progress Notes (Signed)
NURSING PROGRESS NOTE  PROPHET RENWICK [id number] Admission Data: 11/30/2014 10:34 PM Attending Provider: Waldemar Dickens, MD SRP:[id number] [name], MD Code Status: full  HELIX LAFONTAINE is a 53 y.o. male patient admitted from ED:  -No acute distress noted.  -No complaints of shortness of breath.  -No complaints of chest pain.   Cardiac Monitoring: Box # 03 in place. Cardiac monitor yields:normal sinus rhythm.  Blood pressure 157/105, pulse 93, temperature 98.3 F (36.8 C), temperature source Oral, resp. rate 15, height 6' (1.829 m), weight 131.276 kg (289 lb 6.6 oz), SpO2 100 %.   IV Fluids:  IV in place, occlusive dsg intact without redness, IV cath forearm left, condition patent and no redness normal saline.   Allergies:  Iodine and Amlodipine  Past Medical History:   has a past medical history of GERD (gastroesophageal reflux disease); HLD (hyperlipidemia); HTN (hypertension); Hematuria (8/12); PFO (patent foramen ovale); CAD (coronary artery disease); DVT (deep venous thrombosis) (2013); TIA (transient ischemic attack); Type II diabetes mellitus; Arthritis; Rheumatoid arthritis(714.0); Folate deficiency anemia; Carotid artery disease; BPH (benign prostatic hyperplasia); Basal ganglia hemorrhage (2015); Hydrocephalus (2015); Acute respiratory failure (2015); Enterobacter cloacae pneumonia (2015); S/P percutaneous endoscopic gastrostomy (PEG) tube placement (2015); Protein calorie malnutrition; LV dysfunction; Noncompliance; Falls; Urinary incontinence; CVA (cerebral vascular accident) (02/2010); CVA (cerebral vascular accident) (06/2014); OSA on CPAP; and Incontinence of feces.  Past Surgical History:   has past surgical history that includes Knee arthroscopy (Bilateral, 2000's); TEE without cardioversion (03/21/2012); Coronary angioplasty with stent (2012); and left heart catheterization with coronary angiogram (N/A, 04/24/2013).  Social History:   reports that he quit smoking about 2 years  ago. His smoking use included Cigarettes. He has a 35 pack-year smoking history. He has never used smokeless tobacco. He reports that he does not drink alcohol or use illicit drugs.  Skin: WDL except PEG tube to LUQ, no redness or discharge  Patient/Family oriented to room. Information packet given to patient/family. Admission inpatient armband information verified with patient/family to include name and date of birth and placed on patient arm. Side rails up x 2, fall assessment and education completed with patient/family. Patient/family able to verbalize understanding of risk associated with falls and verbalized understanding to call for assistance before getting out of bed. Call light within reach. Patient/family able to voice and demonstrate understanding of unit orientation instructions.

## 2014-11-30 NOTE — ED Notes (Signed)
Pt reports that he has been feeling dizzy and fell this morning. Pt denies hitting his head, but states that he hurts all over.

## 2014-11-30 NOTE — ED Notes (Signed)
Per pts daughter pt fell this morning around 0430 - 0500, pt was on the ground for a couple of hours. Pt was found on his abdomen laying on the ground. Pt was shaking when he was getting off the ground. Pt was complaining of leg pain. Pts daughter said that when they got him up she noticed that "his eyes didn't look normal, it was like he was spaced out".  Pts daughter stated that since the pt had his stroke over the summer he has had some difficulty walking and that it was worse today after he fell. Pts daughter stated that the pt fell about 2-3 weeks ago. Since the pt has come home in November he has fallen three times.

## 2014-11-30 NOTE — ED Notes (Signed)
MD at bedside. 

## 2014-11-30 NOTE — ED Notes (Signed)
2 L nasal cannula applied to pt.

## 2014-11-30 NOTE — ED Notes (Signed)
Pt sleeping, family in room. Pt was laying flat on his back and snoring very loudly with brief periods of apnea. Pts wife stated that he always sounds and acts like this when he is sleeping and has sleep apnea and refuses to wear a mask at night. This nurse raised the pts head of bed to about 30 degrees. Snoring decreased but still persisted.

## 2014-11-30 NOTE — ED Notes (Signed)
Pt has peg tube, pt said it hurt when the area around the tube was lightly palpated, pt stated that is not normal and that it started hurting a few days ago.

## 2014-11-30 NOTE — ED Provider Notes (Signed)
CSN: [id number]     Arrival date & time 11/30/14  1221 History   First MD Initiated Contact with Patient 11/30/14 1455     Chief Complaint  Patient presents with  . Dizziness     (Consider location/radiation/quality/duration/timing/severity/associated sxs/prior Treatment) HPI Comments: Patient presents to the ER for evaluation of dizziness. Patient reports that he had a fall earlier today. He reports that he has been very dizzy and unsteady on his feet for several weeks. He fell sometime today, cannot tell me when. Patient lives with family who are his caregivers. Family reportedly called EMS quite some time after the fall. A family member found him on the ground and was unable get him up. He laid on the ground for a number of hours until help arrived.  She is not sure what caused the fall. He does not think he hit his head and he did not lose consciousness. Patient is complaining of severe swelling of his feet and legs. He denies liver and kidney disease. He is not experiencing any significant shortness of breath or chest pain.  Patient is a 53 y.o. male presenting with dizziness.  Dizziness   Past Medical History  Diagnosis Date  . GERD (gastroesophageal reflux disease)   . HLD (hyperlipidemia)   . HTN (hypertension)   . Hematuria 8/12  . PFO (patent foramen ovale)     a. echo 8/12: EF 55-65%, grade 2 diast dysfnx; +PFO on bubble study. b. TEE 4/13: EF normal, atrial septum with suspicion for interatrial septum fenestrations without flow across and few large bubbles noted in LA.  This was not felt to require closure   . CAD (coronary artery disease)     a. s/p promus DES circumflex artery 10/13/10. b. Cath 04/2013: stable disease but possible small vessel disease -Imdur added.  Marland Kitchen DVT (deep venous thrombosis) 2013    a. 2013. b. 2015 - reported BLE DVT s/p IVC filter.   Marland Kitchen TIA (transient ischemic attack)     "1-2; both after the one in 02/2010" (09/25/2013)  . OSA on CPAP   . Type II  diabetes mellitus   . Arthritis     "knees" (09/25/2013)  . Rheumatoid arthritis(714.0)   . CVA (cerebral vascular accident) 02/2010    a. CVA 2011, prior reported TIAs. b. CNS hemorrhage 2015.  . Folate deficiency anemia   . Carotid artery disease     a. 1-29% BICA by study 04/2014.  Marland Kitchen BPH (benign prostatic hyperplasia)   . Basal ganglia hemorrhage 2015    a. 2015 - with intraventricular extension and hydrocephalus requiring right crani and VP shunt, complicated by encephalopathy, protein calorie malnutrition, acute resp failure req trache and PEG, BLE DVT s/p IVC, enterobacter PNA, subsequent right hemiparesis and difficulty communicating.  Marland Kitchen Hydrocephalus 2015    a. 2/2 BG hemorrhage.  . Acute respiratory failure 2015    a. Following BG hemorrhage - s/p trache with decannulation.  . Enterobacter cloacae pneumonia 2015  . S/P percutaneous endoscopic gastrostomy (PEG) tube placement 2015  . Protein calorie malnutrition   . LV dysfunction     a. 2011 - EF 45-50%, subsequent 55-60% in 04/2014.  Marland Kitchen Noncompliance   . Falls    Past Surgical History  Procedure Laterality Date  . Knee arthroscopy Bilateral 2000's    2 on left and 3 on rt  . Tee without cardioversion  03/21/2012    Procedure: TRANSESOPHAGEAL ECHOCARDIOGRAM (TEE);  Surgeon: Fay Records, MD;  Location: [facility] ENDOSCOPY;  Service: Cardiovascular;  Laterality: N/A;  . Coronary angioplasty with stent placement  2012    "1" (09/24/2013)  . Left heart catheterization with coronary angiogram N/A 04/24/2013    Procedure: LEFT HEART CATHETERIZATION WITH CORONARY ANGIOGRAM;  Surgeon: Burnell Blanks, MD;  Location: [facility] CATH LAB;  Service: Cardiovascular;  Laterality: N/A;   Family History  Problem Relation Age of Onset  . Cancer Neg Hx   . Kidney disease Neg Hx   . Diabetes Neg Hx   . Heart attack Sister   . Heart attack Father   . Stroke Neg Hx   . Hypertension Brother    History  Substance Use Topics  . Smoking status:  Former Smoker -- 1.00 packs/day for 35 years    Types: Cigarettes    Quit date: 03/10/2012  . Smokeless tobacco: Never Used  . Alcohol Use: No    Review of Systems  Cardiovascular: Positive for leg swelling.  Musculoskeletal: Positive for gait problem.  Neurological: Positive for dizziness.  All other systems reviewed and are negative.     Allergies  Iodine and Amlodipine  Home Medications   Prior to Admission medications   Medication Sig Start Date End Date Taking? Authorizing Provider  acetaminophen (TYLENOL) 325 MG tablet Take 1-2 tablets (325-650 mg total) by mouth every 4 (four) hours as needed for mild pain or moderate pain. 11/03/14   Dayna N Dunn, PA-C  atorvastatin (LIPITOR) 80 MG tablet Take 1 tablet (80 mg total) by mouth every evening. 11/03/14   Dayna N Dunn, PA-C  chlorhexidine (PERIDEX) 0.12 % solution Use as directed 15 mLs in the mouth or throat 2 (two) times daily. 11/03/14   Dayna N Dunn, PA-C  feeding supplement, ENSURE COMPLETE, (ENSURE COMPLETE) LIQD Take 237 mLs by mouth 2 (two) times daily. 09/06/14   Bary Leriche, PA-C  ferrous sulfate 220 (44 FE) MG/5ML solution Give 7.33mL (350mg ) by mouth every day for anemia. 11/03/14   Dayna N Dunn, PA-C  fluticasone (FLONASE) 50 MCG/ACT nasal spray Place 2 sprays into both nostrils 2 (two) times daily.    Historical Provider, MD  furosemide (LASIX) 40 MG tablet Take 1 tablet (40 mg total) by mouth daily. 11/12/14   Burnell Blanks, MD  HYDROcodone-acetaminophen (NORCO/VICODIN) 5-325 MG per tablet Take 1 tablet by mouth every 4 (four) hours as needed. 11/16/14   Larene Pickett, PA-C  lisinopril (PRINIVIL,ZESTRIL) 5 MG tablet Take 1 tablet (5 mg total) by mouth daily. 11/03/14   Dayna N Dunn, PA-C  methylphenidate (RITALIN) 5 MG tablet Take 1 tablet (5 mg total) by mouth 2 (two) times daily. 11/03/14   Dayna N Dunn, PA-C  metoprolol succinate (TOPROL-XL) 50 MG 24 hr tablet Take 1 and a half tablets by mouth twice a  day. Take with or immediately following a meal. Patient taking differently: Take 75 mg by mouth 2 (two) times daily. Take 1 and a half tablets by mouth twice a day. Take with or immediately following a meal. 11/03/14   Dayna N Dunn, PA-C  nitroGLYCERIN (NITROSTAT) 0.4 MG SL tablet Place 1 tablet (0.4 mg total) under the tongue every 5 (five) minutes as needed for chest pain (up to 3 doses). 11/03/14   Dayna N Dunn, PA-C  pantoprazole (PROTONIX) 40 MG tablet Take 1 tablet (40 mg total) by mouth 2 (two) times daily. 11/03/14   Dayna N Dunn, PA-C  potassium chloride SA (K-DUR,KLOR-CON) 20 MEQ tablet Take 1 tablet (20 mEq total) by mouth  3 (three) times daily. Patient taking differently: Take 20 mEq by mouth 2 (two) times daily.  09/06/14   Bary Leriche, PA-C  saccharomyces boulardii (FLORASTOR) 250 MG capsule Take 250 mg by mouth 2 (two) times daily.    Historical Provider, MD  senna-docusate (SENOKOT-S) 8.6-50 MG per tablet Take 1 tablet by mouth 2 (two) times daily as needed (constipation). 11/03/14   Dayna N Dunn, PA-C  Water For Irrigation, Sterile (FREE WATER) SOLN Place 50 mLs into feeding tube 2 times daily at 12 noon and 4 pm. 09/06/14   Ivan Anchors Love, PA-C   BP 160/78 mmHg  Pulse 76  Temp(Src) 98.2 F (36.8 C) (Oral)  Resp 13  Ht 6' (1.829 m)  Wt 280 lb (127.007 kg)  BMI 37.97 kg/m2  SpO2 96% Physical Exam  Constitutional: He is oriented to person, place, and time. He appears well-developed and well-nourished. No distress.  HENT:  Head: Normocephalic and atraumatic.  Right Ear: Hearing normal.  Left Ear: Hearing normal.  Nose: Nose normal.  Mouth/Throat: Oropharynx is clear and moist and mucous membranes are normal.  Eyes: Conjunctivae and EOM are normal. Pupils are equal, round, and reactive to light.  Neck: Normal range of motion. Neck supple.  Cardiovascular: Regular rhythm, S1 normal and S2 normal.  Exam reveals no gallop and no friction rub.   No murmur  heard. Pulmonary/Chest: Effort normal and breath sounds normal. No respiratory distress. He exhibits no tenderness.  Abdominal: Soft. Normal appearance and bowel sounds are normal. There is no hepatosplenomegaly. There is no rebound, no guarding, no tenderness at McBurney's point and negative Murphy's sign. No hernia.    Musculoskeletal: Normal range of motion. He exhibits edema (bilateral pitting edema to the pelvis).  Neurological: He is alert and oriented to person, place, and time. He has normal strength. No cranial nerve deficit or sensory deficit. Coordination normal. GCS eye subscore is 4. GCS verbal subscore is 5. GCS motor subscore is 6.  Skin: Skin is warm, dry and intact. No rash noted. No cyanosis.  Psychiatric: He has a normal mood and affect. His speech is normal and behavior is normal. Thought content normal.  Nursing note and vitals reviewed.   ED Course  Procedures (including critical care time) Labs Review Labs Reviewed  CBC - Abnormal; Notable for the following:    RBC 4.19 (*)    Hemoglobin 11.4 (*)    HCT 36.2 (*)    All other components within normal limits  BASIC METABOLIC PANEL - Abnormal; Notable for the following:    Glucose, Bld 164 (*)    GFR calc non Af Amer 65 (*)    GFR calc Af Amer 75 (*)    All other components within normal limits  HEPATIC FUNCTION PANEL - Abnormal; Notable for the following:    Alkaline Phosphatase 135 (*)    All other components within normal limits  URINALYSIS, ROUTINE W REFLEX MICROSCOPIC - Abnormal; Notable for the following:    APPearance HAZY (*)    Hgb urine dipstick SMALL (*)    Protein, ur 30 (*)    Leukocytes, UA MODERATE (*)    All other components within normal limits  CK - Abnormal; Notable for the following:    Total CK 762 (*)    All other components within normal limits  URINE MICROSCOPIC-ADD ON - Abnormal; Notable for the following:    Bacteria, UA FEW (*)    Casts HYALINE CASTS (*)    Crystals CA  OXALATE  CRYSTALS (*)    All other components within normal limits  URINE CULTURE  BRAIN NATRIURETIC PEPTIDE  PROTIME-INR  I-STAT TROPOININ, ED    Imaging Review Dg Chest 2 View  11/30/2014   CLINICAL DATA:  Dizziness, edema  EXAM: CHEST  2 VIEW  COMPARISON:  11/16/2014  FINDINGS: Cardiomegaly again noted. The study is limited by patient's large body habitus. Chronic elevation of the right hemidiaphragm. No acute infiltrate or pulmonary edema. Right VP shunt catheter again noted.  IMPRESSION: Cardiomegaly again noted. No acute infiltrate or pulmonary edema. Chronic elevation of the right hemidiaphragm. Limited study by patient's large body habitus.   Electronically Signed   By: Lahoma Crocker M.D.   On: 11/30/2014 16:00   Ct Head Wo Contrast  11/30/2014   CLINICAL DATA:  53 year old male with recent falls and altered mental status. Initial encounter.  EXAM: CT HEAD WITHOUT CONTRAST  TECHNIQUE: Contiguous axial images were obtained from the base of the skull through the vertex without intravenous contrast.  COMPARISON:  Head CT 11/16/2014 and earlier.  FINDINGS: Stable paranasal sinuses and mastoids. Sequelae of right frontal approach ventriculostomy and shunt placement again noted. Stable appearance of the reservoir and visible shunt tubing. No acute scalp soft tissue finding. Visualized orbit soft tissues are within normal limits. No acute osseous abnormality identified.  Stable ventriculostomy catheter, terminating at the body of the right lateral ventricle. Stable ventricle size and configuration. , the right lateral ventricle remains decompressed.  Incidental small left tentorial lipoma. No midline shift, mass effect, or evidence of intracranial mass lesion. Scattered subcortical white matter hypodensity is stable, nonspecific. No acute intracranial hemorrhage identified. No evidence of cortically based acute infarction identified. No suspicious intracranial vascular hyperdensity.  IMPRESSION: No acute  intracranial abnormality.  Stable right frontal approach CSF shunt and ventricle configuration. Nonspecific white matter changes.   Electronically Signed   By: Lars Pinks M.D.   On: 11/30/2014 16:13     EKG Interpretation   Date/Time:  Tuesday November 30 2014 12:39:44 EST Ventricular Rate:  93 PR Interval:  160 QRS Duration: 86 QT Interval:  354 QTC Calculation: 440 R Axis:   83 Text Interpretation:  Normal sinus rhythm No significant change since last  tracing Abnormal ECG Confirmed by Tamarra Geiselman  MD, Donivin Wirt [phone]) on  11/30/2014 7:39:55 PM      MDM   Final diagnoses:  Dizziness  Edema  Anasarca  UTI (lower urinary tract infection)    Patient presents to the ER for evaluation after a fall. Patient reportedly fell earlier today and laid on the floor for most of the day. He does have a history of multiple recent falls. There does not appear to be any injury from the fall today. CT head did not show any acute abnormality. Chest x-ray also was unchanged from previous. Blood work reveals evidence of urinary tract infection. He was administered Rocephin for this. Urine culture obtained.  Patient does have elevated total CPK indicative of mild rhabdomyolysis from lying on the floor. Cardiac workup was otherwise unremarkable.   Patient has been unable to stand here in the ER. He is extremely shaky, has difficulty standing at the bedside, cannot take any steps. He will require hospitalization for further management.  Orpah Greek, MD 11/30/14 1945

## 2014-11-30 NOTE — Progress Notes (Signed)
Called ED for report  

## 2014-11-30 NOTE — ED Notes (Signed)
Visual acuity test completed and patient reads 20/100 in both eyes. However daughter states patient has reading glasses at home

## 2014-12-01 ENCOUNTER — Telehealth: Payer: Self-pay | Admitting: Internal Medicine

## 2014-12-01 DIAGNOSIS — R5381 Other malaise: Secondary | ICD-10-CM | POA: Diagnosis not present

## 2014-12-01 DIAGNOSIS — I2510 Atherosclerotic heart disease of native coronary artery without angina pectoris: Secondary | ICD-10-CM

## 2014-12-01 DIAGNOSIS — R600 Localized edema: Secondary | ICD-10-CM | POA: Diagnosis not present

## 2014-12-01 LAB — CBC
HCT: 35.1 % — ABNORMAL LOW (ref 39.0–52.0)
Hemoglobin: 10.9 g/dL — ABNORMAL LOW (ref 13.0–17.0)
MCH: 27 pg (ref 26.0–34.0)
MCHC: 31.1 g/dL (ref 30.0–36.0)
MCV: 87.1 fL (ref 78.0–100.0)
PLATELETS: 294 10*3/uL (ref 150–400)
RBC: 4.03 MIL/uL — AB (ref 4.22–5.81)
RDW: 14.5 % (ref 11.5–15.5)
WBC: 5.8 10*3/uL (ref 4.0–10.5)

## 2014-12-01 LAB — GLUCOSE, CAPILLARY
Glucose-Capillary: 105 mg/dL — ABNORMAL HIGH (ref 70–99)
Glucose-Capillary: 101 mg/dL — ABNORMAL HIGH (ref 70–99)

## 2014-12-01 LAB — COMPREHENSIVE METABOLIC PANEL
ALT: 18 U/L (ref 0–53)
AST: 23 U/L (ref 0–37)
Albumin: 3.7 g/dL (ref 3.5–5.2)
Alkaline Phosphatase: 127 U/L — ABNORMAL HIGH (ref 39–117)
Anion gap: 10 (ref 5–15)
BILIRUBIN TOTAL: 1 mg/dL (ref 0.3–1.2)
BUN: 11 mg/dL (ref 6–23)
CHLORIDE: 100 meq/L (ref 96–112)
CO2: 27 mmol/L (ref 19–32)
Calcium: 8.8 mg/dL (ref 8.4–10.5)
Creatinine, Ser: 0.99 mg/dL (ref 0.50–1.35)
GFR calc Af Amer: >90 mL/min (ref >90)
Glucose, Bld: 118 mg/dL — ABNORMAL HIGH (ref 70–99)
Potassium: 3.2 mmol/L — ABNORMAL LOW (ref 3.5–5.1)
SODIUM: 137 mmol/L (ref 135–145)
Total Protein: 6.9 g/dL (ref 6.0–8.3)

## 2014-12-01 LAB — HEMOGLOBIN A1C
Hgb A1c MFr Bld: 6.2 % — ABNORMAL HIGH (ref <5.7)
MEAN PLASMA GLUCOSE: 131 mg/dL — AB (ref <117)

## 2014-12-01 LAB — CK TOTAL AND CKMB (NOT AT ARMC)
CK, MB: 3.5 ng/mL (ref 0.3–4.0)
Relative Index: 0.5 (ref 0.0–2.5)
Total CK: 743 U/L — ABNORMAL HIGH (ref 7–232)

## 2014-12-01 LAB — APTT: aPTT: 35 seconds (ref 24–37)

## 2014-12-01 LAB — PROTIME-INR
INR: 1.16 (ref 0.00–1.49)
Prothrombin Time: 15 seconds (ref 11.6–15.2)

## 2014-12-01 MED ORDER — INSULIN ASPART 100 UNIT/ML ~~LOC~~ SOLN: 0.0000 [IU] | Freq: Every day | SUBCUTANEOUS | Status: DC

## 2014-12-01 MED ORDER — POTASSIUM CHLORIDE CRYS ER 20 MEQ PO TBCR
40.0000 meq | EXTENDED_RELEASE_TABLET | Freq: Once | ORAL | Status: AC
  Administered 2014-12-01: 40 meq via ORAL
  Filled 2014-12-01: qty 2

## 2014-12-01 MED ORDER — INSULIN ASPART 100 UNIT/ML ~~LOC~~ SOLN: 0.0000 [IU] | Freq: Three times a day (TID) | SUBCUTANEOUS | Status: DC

## 2014-12-01 NOTE — Evaluation (Signed)
Occupational Therapy Evaluation Patient Details Name: Tony Davis MRN: [id number] DOB: [birth year] Today's Date: 12/01/2014    History of Present Illness pt admitted after found down on the floor during the night.  Pt's wife and daughter finally got pt into bed.  Pt was unable to assist much in the transfer back to bed. Pt with previous CVA and baseline cognitive deficits.   Clinical Impression   Pt s/p above. Pt requiring assist with ADLs, PTA. Feel pt will benefit from acute OT to increase independence prior to d/c. Recommending SNF for rehab as he does not have 24/7 assist.    Follow Up Recommendations  SNF;Supervision/Assistance - 24 hour    Equipment Recommendations  Other (comment) (defer to next venue)    Recommendations for Other Services       Precautions / Restrictions Precautions Precautions: Fall Restrictions Weight Bearing Restrictions: No      Mobility Bed Mobility      General bed mobility comments: not assessed  Transfers Overall transfer level: Needs assistance Equipment used: Rolling walker (2 wheeled) Transfers: Sit to/from Stand Sit to Stand: Mod assist         General transfer comment: assist to boost. Cues for technique. Assisted with positioning of walker when going to sit (pt may have moved it due to body habitus)         ADL Overall ADL's : Needs assistance/impaired     Grooming: Set up;Supervision/safety;Oral care;Wash/dry face;Applying deodorant;Standing Grooming Details (indicate cue type and reason): cues/assist for positioning of walker in front of pt, but it was tight due to body habitus Upper Body Bathing: Standing;Set up;Supervision/ safety       Upper Body Dressing : Set up;Supervision/safety;Sitting   Lower Body Dressing: Moderate assistance;Sit to/from stand;With adaptive equipment   Toilet Transfer: Min guard;Ambulation;RW;Minimal assistance (Min A to help position walker)           Functional mobility  during ADLs: Min guard;Rolling walker General ADL Comments: Educated on safety (safe shoewear, rugs/clutter, use of bag on walker). Educated on AE for ADLs and told daughter where it can be purchased/cost. Discussed d/c recommendations with pt/daughter.      Vision                     Perception     Praxis      Pertinent Vitals/Pain Pain Assessment: 0-10 Pain Score:  (5-7) Pain Location: back and right shoulder Pain Intervention(s): Monitored during session;Repositioned     Hand Dominance Right   Extremity/Trunk Assessment Upper Extremity Assessment Upper Extremity Assessment: Generalized weakness;RUE deficits/detail RUE Deficits / Details: unable to achieve full AROM right shoulder; pain in right shoulder   Lower Extremity Assessment Lower Extremity Assessment: Defer to PT evaluation       Communication Communication Communication: No difficulties   Cognition Arousal/Alertness: Awake/alert Behavior During Therapy: WFL for tasks assessed/performed Overall Cognitive Status: History of cognitive impairments - at baseline       Memory: Decreased short-term memory             General Comments       Exercises       Shoulder Instructions      Home Living Family/patient expects to be discharged to:: Private residence Living Arrangements: Spouse/significant other;Children Available Help at Discharge: Family;Available PRN/intermittently Type of Home: House Home Access: Stairs to enter [employer] of Steps: 2 Entrance Stairs-Rails: Right;Left Home Layout: One level     Bathroom Shower/Tub: Tub/shower unit Shower/tub characteristics: Curtain  Bathroom Toilet: Standard     Home Equipment: Environmental consultant - 2 wheels;Cane - single point;Bedside commode;Tub bench;Adaptive equipment Adaptive Equipment: Reacher        Prior Functioning/Environment Level of Independence: Needs assistance  Gait / Transfers Assistance Needed: RW and cane with  ambulation ADL's / Homemaking Assistance Needed: assist with bathing and dressing   Comments: uses RW or cane for ambulation    OT Diagnosis: Generalized weakness;Acute pain   OT Problem List: Decreased strength;Decreased activity tolerance;Decreased range of motion;Decreased safety awareness;Decreased cognition;Decreased knowledge of use of DME or AE;Decreased knowledge of precautions;Obesity;Pain   OT Treatment/Interventions: Self-care/ADL training;DME and/or AE instruction;Therapeutic activities;Therapeutic exercise;Patient/family education;Balance training;Cognitive remediation/compensation    OT Goals(Current goals can be found in the care plan section) Acute Rehab OT Goals Patient Stated Goal: not stated OT Goal Formulation: With patient/family Time For Goal Achievement: 12/08/14 Potential to Achieve Goals: Good ADL Goals Pt Will Perform Lower Body Bathing: with min assist;with adaptive equipment;sit to/from stand Pt Will Perform Lower Body Dressing: with min assist;with adaptive equipment;with caregiver independent in assisting;sit to/from stand Pt Will Transfer to Toilet: with modified independence;ambulating Pt Will Perform Toileting - Clothing Manipulation and hygiene: with modified independence;sit to/from stand  OT Frequency: Min 2X/week   Barriers to D/C:            Co-evaluation              End of Session Equipment Utilized During Treatment: Gait belt;Rolling walker (O2 placed back on at end of session)  Activity Tolerance: Patient tolerated treatment well Patient left: in chair;with call bell/phone within reach;with family/visitor present   Time: 4034-7425 OT Time Calculation (min): 27 min Charges:  OT General Charges $OT Visit: 1 Procedure OT Evaluation $Initial OT Evaluation Tier I: 1 Procedure OT Treatments $Self Care/Home Management : 8-22 mins G-CodesBenito Mccreedy OTR/L [phone] 12/01/2014, 4:42 PM

## 2014-12-01 NOTE — Progress Notes (Signed)
Utilization review completed. Ashaad Gaertner, RN, BSN. 

## 2014-12-01 NOTE — Telephone Encounter (Signed)
Radovan / Occup Therapist calling to inform PCP this pt was admitted to hospital last pm.  [phone] is Radovan's # if needed to call

## 2014-12-01 NOTE — Evaluation (Signed)
Clinical/Bedside Swallow Evaluation Patient Details  Name: Tony Davis MRN: [id number] Date of Birth: May 11, 1961  Today's Date: 12/01/2014 Time: 1032-1046 SLP Time Calculation (min) (ACUTE ONLY): 14 min  Past Medical History:  Past Medical History  Diagnosis Date  . GERD (gastroesophageal reflux disease)   . HLD (hyperlipidemia)   . HTN (hypertension)   . Hematuria 8/12  . PFO (patent foramen ovale)     a. echo 8/12: EF 55-65%, grade 2 diast dysfnx; +PFO on bubble study. b. TEE 4/13: EF normal, atrial septum with suspicion for interatrial septum fenestrations without flow across and few large bubbles noted in LA.  This was not felt to require closure   . CAD (coronary artery disease)     a. s/p promus DES circumflex artery 10/13/10. b. Cath 04/2013: stable disease but possible small vessel disease -Imdur added.  Marland Kitchen DVT (deep venous thrombosis) 2013    a. 2013. b. 2015 - reported BLE DVT s/p IVC filter.   Marland Kitchen TIA (transient ischemic attack)     "1-2; both after the one in 02/2010" (09/25/2013)  . Type II diabetes mellitus   . Arthritis     "knees" (09/25/2013)  . Rheumatoid arthritis(714.0)   . Folate deficiency anemia   . Carotid artery disease     a. 1-29% BICA by study 04/2014.  Marland Kitchen BPH (benign prostatic hyperplasia)   . Basal ganglia hemorrhage 2015    a. 2015 - with intraventricular extension and hydrocephalus requiring right crani and VP shunt, complicated by encephalopathy, protein calorie malnutrition, acute resp failure req trache and PEG, BLE DVT s/p IVC, enterobacter PNA, subsequent right hemiparesis and difficulty communicating.  Marland Kitchen Hydrocephalus 2015    a. 2/2 BG hemorrhage.  . Acute respiratory failure 2015    a. Following BG hemorrhage - s/p trache with decannulation.  . Enterobacter cloacae pneumonia 2015  . S/P percutaneous endoscopic gastrostomy (PEG) tube placement 2015  . Protein calorie malnutrition   . LV dysfunction     a. 2011 - EF 45-50%, subsequent 55-60%  in 04/2014.  Marland Kitchen Noncompliance   . Falls   . Urinary incontinence   . CVA (cerebral vascular accident) 02/2010    a. CVA 2011, prior reported TIAs. b. CNS hemorrhage 2015.  Marland Kitchen CVA (cerebral vascular accident) 06/2014    "hemorrhagic; in [place]; brain activity diminshed; ability to do anything; can't do ADLs on own"  . OSA on CPAP     "not using CPAP right now" (11/30/2014)  . Incontinence of feces    Past Surgical History:  Past Surgical History  Procedure Laterality Date  . Knee arthroscopy Bilateral 2000's    2 on left and 3 on rt  . Tee without cardioversion  03/21/2012    Procedure: TRANSESOPHAGEAL ECHOCARDIOGRAM (TEE);  Surgeon: Fay Records, MD;  Location: [facility] ENDOSCOPY;  Service: Cardiovascular;  Laterality: N/A;  . Coronary angioplasty with stent placement  2012    "1" (09/24/2013)  . Left heart catheterization with coronary angiogram N/A 04/24/2013    Procedure: LEFT HEART CATHETERIZATION WITH CORONARY ANGIOGRAM;  Surgeon: Burnell Blanks, MD;  Location: [facility] CATH LAB;  Service: Cardiovascular;  Laterality: N/A;   HPI:  53 y.o. male with PMH: GERD, HTN, PFO, CAD, TIA, DM, CAD, basal ganglia hemorrhage 2015, CVA 2011, OSA admitted after being found down.  Per MD documentation wife states he is getting harder to manage at home due to pts weakness and memory issues. CXR cardiomegaly again noted. No acute infiltrate or pulmonary edema.  Head CT No acute intracranial abnormality. On discharge from inpatient rehab pt on Dys. 2 textures with thin liquids without overt s/s of aspiration and requires Min-Mod A multimodal cues for utilization of swallowing compensatory strategies.   Assessment / Plan / Recommendation Clinical Impression  No overt indications of oral or pharyngeal dysphagia. Oral prep and transit WFL's.  Swallow initiation appeared timely without s/s aspiration.  Pt is somewhat impulsive due to premorbid cognitive deficits and required moderate verbal cues for smaller sips  and bites. He will require full supervision initially for implementation of strategies.  No ST f/u needed.  Please reconsult if difficulties arise.     Aspiration Risk   (mild-mod)    Diet Recommendation Regular;Thin liquid   Liquid Administration via: Cup;Straw Medication Administration: Whole meds with liquid Supervision: Patient able to self feed;Full supervision/cueing for compensatory strategies Compensations: Slow rate;Small sips/bites Postural Changes and/or Swallow Maneuvers: Seated upright 90 degrees;Upright 30-60 min after meal    Other  Recommendations Oral Care Recommendations: Oral care BID   Follow Up Recommendations  None    Frequency and Duration        Pertinent Vitals/Pain No evidence pain         Swallow Study           Oral/Motor/Sensory Function Overall Oral Motor/Sensory Function: Appears within functional limits for tasks assessed   Ice Chips Ice chips: Not tested   Thin Liquid Thin Liquid: Within functional limits Presentation: Cup;Straw    Nectar Thick Nectar Thick Liquid: Not tested   Honey Thick Honey Thick Liquid: Not tested   Puree Puree: Within functional limits   Solid   GO    Solid: Within functional limits       Mick Sell, Orbie Pyo 12/01/2014,10:52 AM  Orbie Pyo Colvin Caroli.Ed [employer] [phone]

## 2014-12-01 NOTE — Progress Notes (Addendum)
INITIAL NUTRITION ASSESSMENT  DOCUMENTATION CODES Per approved criteria  -Obesity Unspecified   INTERVENTION: -D/c Ensure Complete po BID, each supplement provides 350 kcal and 13 grams of protein  NUTRITION DIAGNOSIS: No nutrition dx at this time  Goal: Pt will meet >90% of estimated nutritional needs  Monitor:  PO intake, labs, weight changes, I/O's  Reason for Assessment: Consult to assess needs  53 y.o. male  Admitting Dx: Physical deconditioning  Tony Davis is a 53 y.o. male found sitting on side of bed at 03:00. Wife states this is nml for pt. Wife went to check on pt again at 04:30 and pt was on the floor. Pt denies hitting head or syncope but unable to give reliable history. Wife states that pt has had 2 similar episodes since leaving rehab in September. Wife and daughter managed to get pt back into bed around 07:30. Pt unable to assist in getting back to bed. States he is weak  ASSESSMENT: Spoke with pt, daughter, and wife at bedside. Wife reports pt's appetite is very good at baseline. She confirms that pt has gained weight.  SLP evaluated and study revealed no signs and symptoms of aspiration. Pt has been upgraded to a regular consistent, carb modified diet with thin liquids. Pt denies any issues chewing and swallowing. Pt daughter reports that he ate about half of his Caesar salad at lunch and all of his fruit.  Educated pt on importance of continued good PO intake to support healing process.  Pt with hx of PEG placement, however, does not use currently. Last time PEG was used was in 08/2014. Noted surgical consult to remove PEG.  Pt and family with no further nutrition concerns or questions at this time.  No signs of fat or muscle depletion noted.  Labs reviewed. K: 3.2 (on supplement), Glucose: 118.   Height: Ht Readings from Last 1 Encounters:  11/30/14 6' (1.829 m)    Weight: Wt Readings from Last 1 Encounters:  11/30/14 289 lb 6.6 oz (131.276 kg)     Ideal Body Weight: 178#  % Ideal Body Weight: 162%  Wt Readings from Last 10 Encounters:  11/30/14 289 lb 6.6 oz (131.276 kg)  11/16/14 280 lb (127.007 kg)  11/12/14 293 lb (132.904 kg)  11/03/14 291 lb (131.997 kg)  08/25/14 258 lb 11.2 oz (117.346 kg)  07/30/14 275 lb (124.739 kg)  05/27/14 277 lb (125.646 kg)  05/13/14 278 lb (126.1 kg)  04/28/14 277 lb (125.646 kg)  04/14/14 270 lb (122.471 kg)    Usual Body Weight: 270#  % Usual Body Weight: 107%  BMI:  Body mass index is 39.24 kg/(m^2). Obesity, class II  Estimated Nutritional Needs: Kcal: 2100-2300 Protein: 93-103 grams protein Fluid: 2.1-2.3 L  Skin: Intact  Diet Order: Diet Carb Modified  EDUCATION NEEDS: -Education needs addressed   Intake/Output Summary (Last 24 hours) at 12/01/14 1520 Last data filed at 12/01/14 0825  Gross per 24 hour  Intake      0 ml  Output    550 ml  Net   -550 ml    Last BM: PTA  Labs:   Recent Labs Lab 11/30/14 1241 12/01/14 0600  NA 139 137  K 3.6 3.2*  CL 102 100  CO2 27 27  BUN 16 11  CREATININE 1.25 0.99  CALCIUM 9.6 8.8  GLUCOSE 164* 118*    CBG (last 3)  No results for input(s): GLUCAP in the last 72 hours.  Scheduled Meds: . atorvastatin  80 mg Oral QPM  . cephALEXin  500 mg Oral 3 times per day  . chlorhexidine  15 mL Mouth/Throat BID  . feeding supplement (ENSURE COMPLETE)  237 mL Oral BID  . furosemide  40 mg Oral Daily  . heparin  5,000 Units Subcutaneous 3 times per day  . lisinopril  5 mg Oral Daily  . methylphenidate  5 mg Oral BID  . metoprolol succinate  100 mg Oral Daily  . pantoprazole  40 mg Oral BID  . potassium chloride SA  20 mEq Oral BID    Continuous Infusions: . sodium chloride 100 mL/hr at 12/01/14 4010    Past Medical History  Diagnosis Date  . GERD (gastroesophageal reflux disease)   . HLD (hyperlipidemia)   . HTN (hypertension)   . Hematuria 8/12  . PFO (patent foramen ovale)     a. echo 8/12: EF 55-65%,  grade 2 diast dysfnx; +PFO on bubble study. b. TEE 4/13: EF normal, atrial septum with suspicion for interatrial septum fenestrations without flow across and few large bubbles noted in LA.  This was not felt to require closure   . CAD (coronary artery disease)     a. s/p promus DES circumflex artery 10/13/10. b. Cath 04/2013: stable disease but possible small vessel disease -Imdur added.  Marland Kitchen DVT (deep venous thrombosis) 2013    a. 2013. b. 2015 - reported BLE DVT s/p IVC filter.   Marland Kitchen TIA (transient ischemic attack)     "1-2; both after the one in 02/2010" (09/25/2013)  . Type II diabetes mellitus   . Arthritis     "knees" (09/25/2013)  . Rheumatoid arthritis(714.0)   . Folate deficiency anemia   . Carotid artery disease     a. 1-29% BICA by study 04/2014.  Marland Kitchen BPH (benign prostatic hyperplasia)   . Basal ganglia hemorrhage 2015    a. 2015 - with intraventricular extension and hydrocephalus requiring right crani and VP shunt, complicated by encephalopathy, protein calorie malnutrition, acute resp failure req trache and PEG, BLE DVT s/p IVC, enterobacter PNA, subsequent right hemiparesis and difficulty communicating.  Marland Kitchen Hydrocephalus 2015    a. 2/2 BG hemorrhage.  . Acute respiratory failure 2015    a. Following BG hemorrhage - s/p trache with decannulation.  . Enterobacter cloacae pneumonia 2015  . S/P percutaneous endoscopic gastrostomy (PEG) tube placement 2015  . Protein calorie malnutrition   . LV dysfunction     a. 2011 - EF 45-50%, subsequent 55-60% in 04/2014.  Marland Kitchen Noncompliance   . Falls   . Urinary incontinence   . CVA (cerebral vascular accident) 02/2010    a. CVA 2011, prior reported TIAs. b. CNS hemorrhage 2015.  Marland Kitchen CVA (cerebral vascular accident) 06/2014    "hemorrhagic; in [place]; brain activity diminshed; ability to do anything; can't do ADLs on own"  . OSA on CPAP     "not using CPAP right now" (11/30/2014)  . Incontinence of feces     Past Surgical History  Procedure  Laterality Date  . Knee arthroscopy Bilateral 2000's    2 on left and 3 on rt  . Tee without cardioversion  03/21/2012    Procedure: TRANSESOPHAGEAL ECHOCARDIOGRAM (TEE);  Surgeon: Fay Records, MD;  Location: [facility] ENDOSCOPY;  Service: Cardiovascular;  Laterality: N/A;  . Coronary angioplasty with stent placement  2012    "1" (09/24/2013)  . Left heart catheterization with coronary angiogram N/A 04/24/2013    Procedure: LEFT HEART CATHETERIZATION WITH CORONARY ANGIOGRAM;  Surgeon: Burnell Blanks, MD;  Location: [facility] CATH LAB;  Service: Cardiovascular;  Laterality: N/A;    Amandeep Nesmith A. Jimmye Norman, RD, LDN Pager: [phone] After hours Pager: [phone]

## 2014-12-01 NOTE — Progress Notes (Addendum)
[facility] PROGRESS NOTE  CREW GOREN GEX:[id number] DOB: August 23, 1961 DOA: 11/30/2014 PCP: [name], MD  Brief narrative 53 year old male with history of basal ganglia hemorrhage earlier this year with hydrocephalus requiring right craniotomy and VP shunt placement with hospital course complicated by encephalopathy and protein calorie malnutrition, bilateral DVT requiring IVC, acute respiratory failure requiring trach and PEG with residual right paresis and difficulty communicating, Enterobacter pneumonia,, hypertension, hyperlipidemia, coronary artery disease with history of stent in 2011, and OSA currently not on CPAP brought to the hospital by his wife for frequent falls. Patient was found on the side of bed and on the floor on 2 occasions. He denies hitting his head or having a syncopal event. He is reportedly found to be very weak and per wife has difficulty managing him at home.   Assessment/Plan: Progressive physical deconditioning [facility] and then transfered to inpatient rehabilitation. He has residual dementia with weakness. As per wife the weakness has progressed and has difficulty taking care of him at home. -No worsening neurological deficit noted. No sign of infection. Seen by physical therapy and recommend skilled nursing facility. -Social worker aware. -Seen by swallow eval and recommend regular thin liquid.  UTI On empiric Rocephin. Follow culture  Hypertension and coronary artery disease Continue Lipitor, metoprolol, lisinopril and Lasix. Follows with Dr. Julianne Handler  GERD Continue Protonix twice a day  Hypokalemia Replenish with KCl   Protein calorie malnutrition Continue supplements.  Type 2 diabetes mellitus Do not see him to be on any medication at this time. Monitor with sliding scale insulin. Check A1c    DVT prophylaxis: Subcutaneous heparin  Diet: Heart healthy  Code Status: Full code Family  Communication: None at bedside Disposition Plan: Nursing facility possibly on 12/24   Consultants:  None  Procedures:  None  Antibiotics:  None  HPI/Subjective: Since seen and examined. Denies any headache or discomfort. Poor historian.  Objective: Filed Vitals:   12/01/14 1501  BP: 112/68  Pulse:   Temp: 98.4 F (36.9 C)  Resp: 16    Intake/Output Summary (Last 24 hours) at 12/01/14 1547 Last data filed at 12/01/14 0825  Gross per 24 hour  Intake      0 ml  Output    550 ml  Net   -550 ml   Filed Weights   11/30/14 1237 11/30/14 2030  Weight: 127.007 kg (280 lb) 131.276 kg (289 lb 6.6 oz)    Exam:   General:  Middle aged obese male lying in it in no acute distress, flat affect  HEENT: No pallor, moist oral mucosa  Chest: Clear to auscultation bilaterally, no added sounds  CVS: Normal S1 and S2, no murmurs  Abdomen: Soft, PEG tube in place, nondistended, bowel sounds present, nontender  Extremities: Trace edema bilaterally  CNS: Alert and awake, oriented X2, flat affect    Data Reviewed: Basic Metabolic Panel:  Recent Labs Lab 11/30/14 1241 12/01/14 0600  NA 139 137  K 3.6 3.2*  CL 102 100  CO2 27 27  GLUCOSE 164* 118*  BUN 16 11  CREATININE 1.25 0.99  CALCIUM 9.6 8.8   Liver Function Tests:  Recent Labs Lab 11/30/14 1630 12/01/14 0600  AST 26 23  ALT 19 18  ALKPHOS 135* 127*  BILITOT 0.7 1.0  PROT 7.1 6.9  ALBUMIN 4.1 3.7   No results for input(s): LIPASE, AMYLASE in the last 168 hours. No results for input(s): AMMONIA in the last 168 hours.  CBC:  Recent Labs Lab 11/30/14 1241 12/01/14 0600  WBC 7.6 5.8  HGB 11.4* 10.9*  HCT 36.2* 35.1*  MCV 86.4 87.1  PLT 314 294   Cardiac Enzymes:  Recent Labs Lab 11/30/14 1544 12/01/14 0600  CKTOTAL 762* 743*  CKMB  --  3.5   BNP (last 3 results) No results for input(s): PROBNP in the last 8760 hours. CBG: No results for input(s): GLUCAP in the last 168 hours.  No  results found for this or any previous visit (from the past 240 hour(s)).   Studies: Dg Chest 2 View  11/30/2014   CLINICAL DATA:  Dizziness, edema  EXAM: CHEST  2 VIEW  COMPARISON:  11/16/2014  FINDINGS: Cardiomegaly again noted. The study is limited by patient's large body habitus. Chronic elevation of the right hemidiaphragm. No acute infiltrate or pulmonary edema. Right VP shunt catheter again noted.  IMPRESSION: Cardiomegaly again noted. No acute infiltrate or pulmonary edema. Chronic elevation of the right hemidiaphragm. Limited study by patient's large body habitus.   Electronically Signed   By: Lahoma Crocker M.D.   On: 11/30/2014 16:00   Ct Head Wo Contrast  11/30/2014   CLINICAL DATA:  53 year old male with recent falls and altered mental status. Initial encounter.  EXAM: CT HEAD WITHOUT CONTRAST  TECHNIQUE: Contiguous axial images were obtained from the base of the skull through the vertex without intravenous contrast.  COMPARISON:  Head CT 11/16/2014 and earlier.  FINDINGS: Stable paranasal sinuses and mastoids. Sequelae of right frontal approach ventriculostomy and shunt placement again noted. Stable appearance of the reservoir and visible shunt tubing. No acute scalp soft tissue finding. Visualized orbit soft tissues are within normal limits. No acute osseous abnormality identified.  Stable ventriculostomy catheter, terminating at the body of the right lateral ventricle. Stable ventricle size and configuration. , the right lateral ventricle remains decompressed.  Incidental small left tentorial lipoma. No midline shift, mass effect, or evidence of intracranial mass lesion. Scattered subcortical white matter hypodensity is stable, nonspecific. No acute intracranial hemorrhage identified. No evidence of cortically based acute infarction identified. No suspicious intracranial vascular hyperdensity.  IMPRESSION: No acute intracranial abnormality.  Stable right frontal approach CSF shunt and ventricle  configuration. Nonspecific white matter changes.   Electronically Signed   By: Lars Pinks M.D.   On: 11/30/2014 16:13    Scheduled Meds: . atorvastatin  80 mg Oral QPM  . cephALEXin  500 mg Oral 3 times per day  . chlorhexidine  15 mL Mouth/Throat BID  . feeding supplement (ENSURE COMPLETE)  237 mL Oral BID  . furosemide  40 mg Oral Daily  . heparin  5,000 Units Subcutaneous 3 times per day  . lisinopril  5 mg Oral Daily  . methylphenidate  5 mg Oral BID  . metoprolol succinate  100 mg Oral Daily  . pantoprazole  40 mg Oral BID  . potassium chloride SA  20 mEq Oral BID   Continuous Infusions: . sodium chloride 100 mL/hr at 12/01/14 0816      Time spent: 25 minutes    Katelee Schupp, [place]  [facility] Pager [phone]. If 7PM-7AM, please contact night-coverage at [email or web address], password [facility] 12/01/2014, 3:47 PM  LOS: 1 day

## 2014-12-01 NOTE — Evaluation (Signed)
Physical Therapy Evaluation Patient Details Name: Tony Davis MRN: [id number] DOB: [birth year] Today's Date: 12/01/2014   History of Present Illness  pt admitted after found down on the floor during the night.  Pt's wife and daughter finally got pt into bed.  Pt was unable to assist much in the transfer back to bed.  Clinical Impression  Pt admitted with/for fall and general weakness/deconditioning.  Pt currently limited functionally due to the problems listed. ( See problems list.)   Pt will benefit from PT to maximize function and safety in order to get ready for next venue listed below.     Follow Up Recommendations SNF;Other (comment) (or home with UP TO 24 hour assist and HHPT)    Equipment Recommendations  None recommended by PT    Recommendations for Other Services       Precautions / Restrictions Precautions Precautions: Fall      Mobility  Bed Mobility Overal bed mobility: Needs Assistance Bed Mobility: Supine to Sit     Supine to sit: Mod assist     General bed mobility comments: help needed to build momentum to come forward to sit and to scoot to EOB  Transfers Overall transfer level: Needs assistance   Transfers: Sit to/from Stand Sit to Stand: Min assist         General transfer comment: assist to come forward with mild lift assist;  cues for hand placement/safety  Ambulation/Gait Ambulation/Gait assistance: Min assist;Min guard (initially min then pt became more steady) Ambulation Distance (Feet): 380 Feet Assistive device: Rolling walker (2 wheeled) Gait Pattern/deviations: Step-through pattern Gait velocity: slower Gait velocity interpretation: Below normal speed for age/gender General Gait Details: moderate equal steps, slower and initially tentative, later steady.  Pt prefers to stay too far behine the RW and lean onto his arms.  It worked for him.  Stairs            Wheelchair Mobility    Modified Rankin (Stroke Patients  Only)       Balance Overall balance assessment: Needs assistance Sitting-balance support: No upper extremity supported Sitting balance-Leahy Scale: Fair Sitting balance - Comments: did not accept challenge but sat EOB steady   Standing balance support: No upper extremity supported Standing balance-Leahy Scale: Fair Standing balance comment: able to stand statically and not hold the RW                             Pertinent Vitals/Pain Pain Assessment: No/denies pain (pt report stiffness and discomfort in knee on first ROM o/w )    Home Living Family/patient expects to be discharged to:: Private residence Living Arrangements: Spouse/significant other;Children Available Help at Discharge: Family Type of Home: House Home Access: Stairs to enter Entrance Stairs-Rails: Psychiatric nurse of Steps: 2 Home Layout: One level Home Equipment: Environmental consultant - 2 wheels;Cane - single point;Bedside commode      Prior Function Level of Independence: Needs assistance   Gait / Transfers Assistance Needed: RW and cane with ambulation  ADL's / Homemaking Assistance Needed: Pt reports difficulty with LB ADLs due to knee OA  Comments: uses RW or cane for ambulation     Hand Dominance   Dominant Hand: Right    Extremity/Trunk Assessment   Upper Extremity Assessment: Overall WFL for tasks assessed;Generalized weakness (L LE mildly weaker than R LE)  Communication   Communication: No difficulties  Cognition Arousal/Alertness: Awake/alert Behavior During Therapy: WFL for tasks assessed/performed Overall Cognitive Status: History of cognitive impairments - at baseline       Memory: Decreased short-term memory              General Comments      Exercises        Assessment/Plan    PT Assessment Patient needs continued PT services  PT Diagnosis Abnormality of gait;Generalized weakness   PT Problem List Decreased  strength;Decreased activity tolerance;Decreased balance;Decreased mobility;Decreased knowledge of use of DME;Decreased knowledge of precautions;Decreased cognition  PT Treatment Interventions DME instruction;Gait training;Stair training;Functional mobility training;Therapeutic activities;Therapeutic exercise;Balance training;Patient/family education   PT Goals (Current goals can be found in the Care Plan section) Acute Rehab PT Goals Patient Stated Goal: want to go home PT Goal Formulation: With patient Time For Goal Achievement: 12/15/14 Potential to Achieve Goals: Good    Frequency Min 3X/week   Barriers to discharge Decreased caregiver support      Co-evaluation               End of Session   Activity Tolerance: Patient tolerated treatment well Patient left: in chair;with call bell/phone within reach Nurse Communication: Mobility status         Time: 9166-0600 PT Time Calculation (min) (ACUTE ONLY): 39 min   Charges:   PT Evaluation $Initial PT Evaluation Tier I: 1 Procedure PT Treatments $Gait Training: 8-22 mins $Therapeutic Activity: 8-22 mins   PT G Codes:          Curlee Bogan, Tessie Fass 12/01/2014, 1:34 PM 12/01/2014  Donnella Sham, PT [phone]  (pager)

## 2014-12-02 DIAGNOSIS — E118 Type 2 diabetes mellitus with unspecified complications: Secondary | ICD-10-CM

## 2014-12-02 DIAGNOSIS — I10 Essential (primary) hypertension: Secondary | ICD-10-CM | POA: Diagnosis not present

## 2014-12-02 DIAGNOSIS — R5381 Other malaise: Secondary | ICD-10-CM | POA: Diagnosis not present

## 2014-12-02 DIAGNOSIS — N390 Urinary tract infection, site not specified: Secondary | ICD-10-CM | POA: Diagnosis not present

## 2014-12-02 LAB — GLUCOSE, CAPILLARY
GLUCOSE-CAPILLARY: 84 mg/dL (ref 70–99)
GLUCOSE-CAPILLARY: 90 mg/dL (ref 70–99)
Glucose-Capillary: 83 mg/dL (ref 70–99)

## 2014-12-02 LAB — BASIC METABOLIC PANEL
Anion gap: 8 (ref 5–15)
BUN: 11 mg/dL (ref 6–23)
CO2: 26 mmol/L (ref 19–32)
CREATININE: 1.02 mg/dL (ref 0.50–1.35)
Calcium: 9 mg/dL (ref 8.4–10.5)
Chloride: 103 mEq/L (ref 96–112)
GFR calc non Af Amer: 83 mL/min — ABNORMAL LOW (ref >90)
GLUCOSE: 108 mg/dL — AB (ref 70–99)
POTASSIUM: 3.4 mmol/L — AB (ref 3.5–5.1)
Sodium: 137 mmol/L (ref 135–145)

## 2014-12-02 LAB — URINE CULTURE
CULTURE: NO GROWTH
Colony Count: NO GROWTH
Colony Count: NO GROWTH
Culture: NO GROWTH

## 2014-12-02 MED ORDER — CEPHALEXIN 250 MG/5ML PO SUSR: 500.0000 mg | Freq: Three times a day (TID) | ORAL | Status: DC

## 2014-12-02 NOTE — Progress Notes (Signed)
12/02/14 Patient being discharged home today with family. IV site removed, Discharge instructions reviewed with patient.Patient dressed and taken in w/c to family car by aide.

## 2014-12-02 NOTE — Progress Notes (Signed)
Physical Therapy Treatment Patient Details Name: Tony Davis MRN: [id number] DOB: [birth year] Today's Date: 12/02/2014    History of Present Illness pt admitted after found down on the floor during the night.  Pt's wife and daughter finally got pt into bed.  Pt was unable to assist much in the transfer back to bed. Pt with previous CVA and baseline cognitive deficits.    PT Comments    Progressing slowly.  Will have to have extra family assist at first until family understands his limitations.  He is possibly closing in on baseline function, but I'm not fully sure of his PLOF.  Follow Up Recommendations  Other (comment) (pt wishes to go straight home with family assist)     Equipment Recommendations  None recommended by PT    Recommendations for Other Services       Precautions / Restrictions Precautions Precautions: Fall    Mobility  Bed Mobility Overal bed mobility: Needs Assistance Bed Mobility: Supine to Sit     Supine to sit: Mod assist     General bed mobility comments: cues for initiation/ to get him to use UE's appropriately and truncal assist to poswer up  Transfers Overall transfer level: Needs assistance Equipment used: Rolling walker (2 wheeled) Transfers: Sit to/from Stand Sit to Stand: Mod assist         General transfer comment: assist to boost. Cues for technique. Assisted with positioning of walker when going to sit (pt may have moved it due to body habitus)  Ambulation/Gait Ambulation/Gait assistance: Min guard;Min assist Ambulation Distance (Feet): 380 Feet Assistive device: Rolling walker (2 wheeled)   Gait velocity: slower   General Gait Details: moderate equal steps, slower and initially tentative, later steady.  Pt prefers to stay too far behine the RW and lean onto his arms. At times today he was not optimally steady.   Stairs Stairs: Yes Stairs assistance: Min assist Stair Management: One rail Right;Step to  pattern;Forwards Number of Stairs: 3 General stair comments: effortful ascent,  easier descent  Wheelchair Mobility    Modified Rankin (Stroke Patients Only)       Balance Overall balance assessment: Needs assistance   Sitting balance-Leahy Scale: Fair     Standing balance support: Bilateral upper extremity supported;Single extremity supported Standing balance-Leahy Scale: Poor                      Cognition Arousal/Alertness: Awake/alert Behavior During Therapy: WFL for tasks assessed/performed Overall Cognitive Status: History of cognitive impairments - at baseline       Memory: Decreased short-term memory              Exercises      General Comments        Pertinent Vitals/Pain Pain Assessment: Faces Faces Pain Scale: Hurts little more Pain Location: R leg Pain Descriptors / Indicators: Aching Pain Intervention(s): Monitored during session    Home Living                      Prior Function            PT Goals (current goals can now be found in the care plan section) Acute Rehab PT Goals PT Goal Formulation: With patient Time For Goal Achievement: 12/15/14 Potential to Achieve Goals: Good Progress towards PT goals: Progressing toward goals    Frequency  Min 3X/week    PT Plan Current plan remains appropriate    Co-evaluation  End of Session   Activity Tolerance: Patient tolerated treatment well Patient left: in chair;with call bell/phone within reach     Time: 1315-1341 PT Time Calculation (min) (ACUTE ONLY): 26 min  Charges:  $Gait Training: 8-22 mins $Therapeutic Activity: 8-22 mins                    G Codes:      Mylin Gignac, Tessie Fass 12/02/2014, 1:58 PM 12/02/2014  Donnella Sham, PT [phone]  (pager)

## 2014-12-02 NOTE — Care Management Note (Addendum)
    Page 1 of 2   12/02/2014     2:32:56 PM CARE MANAGEMENT NOTE 12/02/2014  Patient:  Tony Davis, Tony Davis   Account Number:  [id number]  Date Initiated:  12/02/2014  Documentation initiated by:  Tomi Bamberger  Subjective/Objective Assessment:   dx dementia, hx cva  admit- lives with spouse.     Action/Plan:   pt rec snf- pt is medically ready for dc today per MD will go with hh services.   Anticipated DC Date:  12/02/2014   Anticipated DC Plan:  [place]  In-house referral  Clinical Social Worker      DC Planning Services  CM consult      [facility] Choice  HOME HEALTH   Choice offered to / List presented to:  C-3 Spouse        HH arranged  HH-1 RN  [place]      [place] agency  [place]   Status of service:  Completed, signed off Medicare Important Message given?  YES (If response is "NO", the following Medicare IM given date fields will be blank) Date Medicare IM given:  12/02/2014 Medicare IM given by:  Tomi Bamberger Date Additional Medicare IM given:   Additional Medicare IM given by:    Discharge Disposition:  [place]  Per UR Regulation:  Reviewed for med. necessity/level of care/duration of stay  If discussed at [place] of Stay Meetings, dates discussed:    Comments:  12/02/14 [place], [id number] [phone] NCM spoke with Richardson Chiquito, patient's spouse, she chose [facility], for [facility], [place], [place], aide and social work. Will resume,   Referral made to [facility], [facility]  notified.  Soc will begin 24-48 hrs post dc.

## 2014-12-02 NOTE — Discharge Summary (Signed)
Physician Discharge Summary  Tony Davis JQB:[id number] DOB: March 26, 1961 DOA: 11/30/2014  PCP: [name], MD  Admit date: 11/30/2014 Discharge date: 12/02/2014  Time spent: >35 minutes  Recommendations for Outpatient Follow-up:  HHC/PT F/u with PCP in 1 week   Discharge Diagnoses:  Principal Problem:   Physical deconditioning Active Problems:   CAD, NATIVE VESSEL   GERD   Type II diabetes mellitus with manifestations   S/P percutaneous endoscopic gastrostomy (PEG) tube placement   Essential hypertension   Bilateral leg edema   Memory loss   UTI (urinary tract infection)   Diastolic dysfunction with chronic heart failure   Dizziness   UTI (lower urinary tract infection)   Discharge Condition: stable   Diet recommendation: DM  Filed Weights   11/30/14 1237 11/30/14 2030  Weight: 127.007 kg (280 lb) 131.276 kg (289 lb 6.6 oz)    History of present illness:  53 year old male with history of basal ganglia hemorrhage earlier this year with hydrocephalus requiring right craniotomy and VP shunt placement with hospital course complicated by encephalopathy and protein calorie malnutrition, bilateral DVT requiring IVC, acute respiratory failure requiring trach and PEG with residual right paresis and difficulty communicating, Enterobacter pneumonia,, hypertension, hyperlipidemia, coronary artery disease with history of stent in 2011, and OSA currently not on CPAP brought to the hospital by his wife for frequent falls, generalized weakness    Hospital Course:  1. Progressive physical deconditioning due to old CVA related complications likely worse due to probable UTI -No worsening neurological deficit noted. No sign of infection. Seen by physical therapy and recommend skilled nursing facility. D/w patient, family, SW will arrange HHC/PT  2. Probable UTI, patient received empiric Rocephin. Prelim cultures NGTD; change to PO atx to complete treatment regimen  3. Hypertension  and coronary artery disease Continue Lipitor, metoprolol, lisinopril and Lasix. Follows with Dr. Julianne Handler 4. GERD Continue Protonix twice a day 5. Hypokalemia, Replenish with KCl 6. Protein calorie malnutrition Continue supplements. 7. H/o Type 2 diabetes mellitus Check A1c-6.8; cont diet control;     Procedures:  none (i.e. Studies not automatically included, echos, thoracentesis, etc; not x-rays)  Consultations:  none  Discharge Exam: Filed Vitals:   12/02/14 1419  BP: 156/95  Pulse: 87  Temp: 98.1 F (36.7 C)  Resp: 16    General: alert Cardiovascular: s1,s2 rrr Respiratory: CTA BL  Discharge Instructions  Discharge Instructions    Diet - low sodium heart healthy    Complete by:  As directed      Discharge instructions    Complete by:  As directed   Please  Follow up with primary care doctor in 1 week     Increase activity slowly    Complete by:  As directed             Medication List    STOP taking these medications        feeding supplement (ENSURE COMPLETE) Liqd      TAKE these medications        acetaminophen 325 MG tablet  Commonly known as:  TYLENOL  Take 1-2 tablets (325-650 mg total) by mouth every 4 (four) hours as needed for mild pain or moderate pain.     atorvastatin 80 MG tablet  Commonly known as:  LIPITOR  Take 1 tablet (80 mg total) by mouth every evening.     cephALEXin 250 MG/5ML suspension  Commonly known as:  KEFLEX  Take 10 mLs (500 mg total) by mouth every 8 (eight)  hours.     chlorhexidine 0.12 % solution  Commonly known as:  PERIDEX  Use as directed 15 mLs in the mouth or throat 2 (two) times daily.     ferrous sulfate 220 (44 FE) MG/5ML solution  Give 7.38mL (350mg ) by mouth every day for anemia.     fluticasone 50 MCG/ACT nasal spray  Commonly known as:  FLONASE  Place 2 sprays into both nostrils 2 (two) times daily.     free water Soln  Place 50 mLs into feeding tube 2 times daily at 12 noon and 4 pm.      furosemide 40 MG tablet  Commonly known as:  LASIX  Take 1 tablet (40 mg total) by mouth daily.     HYDROcodone-acetaminophen 5-325 MG per tablet  Commonly known as:  NORCO/VICODIN  Take 1 tablet by mouth every 4 (four) hours as needed.     lisinopril 5 MG tablet  Commonly known as:  PRINIVIL,ZESTRIL  Take 1 tablet (5 mg total) by mouth daily.     methylphenidate 5 MG tablet  Commonly known as:  RITALIN  Take 1 tablet (5 mg total) by mouth 2 (two) times daily.     metoprolol succinate 50 MG 24 hr tablet  Commonly known as:  TOPROL-XL  Take 1 and a half tablets by mouth twice a day. Take with or immediately following a meal.     nitroGLYCERIN 0.4 MG SL tablet  Commonly known as:  NITROSTAT  Place 1 tablet (0.4 mg total) under the tongue every 5 (five) minutes as needed for chest pain (up to 3 doses).     pantoprazole 40 MG tablet  Commonly known as:  PROTONIX  Take 1 tablet (40 mg total) by mouth 2 (two) times daily.     potassium chloride SA 20 MEQ tablet  Commonly known as:  K-DUR,KLOR-CON  Take 1 tablet (20 mEq total) by mouth 3 (three) times daily.     saccharomyces boulardii 250 MG capsule  Commonly known as:  FLORASTOR  Take 250 mg by mouth 2 (two) times daily.     senna-docusate 8.6-50 MG per tablet  Commonly known as:  Senokot-S  Take 1 tablet by mouth 2 (two) times daily as needed (constipation).       Allergies  Allergen Reactions  . Iodine Anaphylaxis  . Amlodipine Swelling and Other (See Comments)    Foot edema       Follow-up Information    Follow up with [facility].   Why:  hhpt , rn, ot, aide and social work   Sport and exercise psychologist information:   [address] [place] [address] [phone]       Follow up with [name], MD In 1 week.   Specialty:  Internal Medicine   Contact information:   [address] [address] [phone]        The results of significant diagnostics from this  hospitalization (including imaging, microbiology, ancillary and laboratory) are listed below for reference.    Significant Diagnostic Studies: Dg Chest 1 View  11/16/2014   CLINICAL DATA:  Altered mental status, fell today  EXAM: CHEST - 1 VIEW  COMPARISON:  07/24/2014  FINDINGS: VP shunt tubing traverses RIGHT chest.  Enlargement of cardiac silhouette with tortuous aorta.  Pulmonary vascularity mediastinal contours otherwise normal.  Tracheostomy tube seen on previous exam no longer identified.  Lungs clear.  No pleural effusion or pneumothorax.  Bones unremarkable.  IMPRESSION:  Enlargement of cardiac silhouette.  No acute abnormalities.   Electronically Signed   By: Lavonia Dana M.D.   On: 11/16/2014 21:40   Dg Chest 2 View  11/30/2014   CLINICAL DATA:  Dizziness, edema  EXAM: CHEST  2 VIEW  COMPARISON:  11/16/2014  FINDINGS: Cardiomegaly again noted. The study is limited by patient's large body habitus. Chronic elevation of the right hemidiaphragm. No acute infiltrate or pulmonary edema. Right VP shunt catheter again noted.  IMPRESSION: Cardiomegaly again noted. No acute infiltrate or pulmonary edema. Chronic elevation of the right hemidiaphragm. Limited study by patient's large body habitus.   Electronically Signed   By: Lahoma Crocker M.D.   On: 11/30/2014 16:00   Dg Pelvis 1-2 Views  11/16/2014   CLINICAL DATA:  [place] today, RIGHT and LEFT leg pain  EXAM: PELVIS - 1-2 VIEW  COMPARISON:  None  FINDINGS: VP shunt tubing noted in RIGHT abdomen and pelvis.  Bones demineralized.  Hip and SI joint spaces preserved.  No acute fracture, dislocation or bone destruction.  IMPRESSION: No acute osseous abnormalities.   Electronically Signed   By: Lavonia Dana M.D.   On: 11/16/2014 21:40   Dg Femur Left  11/16/2014   CLINICAL DATA:  Fall today.  Altered mental status.  Left leg pain.  EXAM: LEFT FEMUR - 2 VIEW  COMPARISON:  Left knee 10/13/2012  FINDINGS: Degenerative changes demonstrated in the left hip and left  knee. No evidence of acute fracture or dislocation. No focal bone lesion or bone destruction.  IMPRESSION: Degenerative changes in the left hip and left knee. No acute bony abnormalities.   Electronically Signed   By: Lucienne Capers M.D.   On: 11/16/2014 21:41   Dg Femur Right  11/16/2014   CLINICAL DATA:  Fall today. Right leg pain. Left leg pain. Altered mental status.  EXAM: RIGHT FEMUR - 2 VIEW  COMPARISON:  None.  FINDINGS: Degenerative changes in the right hip. Degenerative changes in the right knee. There is no evidence of fracture or other focal bone lesions. Soft tissues are unremarkable.  IMPRESSION: Degenerative changes in the right hip and right knee. No acute bony abnormalities.   Electronically Signed   By: Lucienne Capers M.D.   On: 11/16/2014 21:40   Ct Head Wo Contrast  11/30/2014   CLINICAL DATA:  53 year old male with recent falls and altered mental status. Initial encounter.  EXAM: CT HEAD WITHOUT CONTRAST  TECHNIQUE: Contiguous axial images were obtained from the base of the skull through the vertex without intravenous contrast.  COMPARISON:  Head CT 11/16/2014 and earlier.  FINDINGS: Stable paranasal sinuses and mastoids. Sequelae of right frontal approach ventriculostomy and shunt placement again noted. Stable appearance of the reservoir and visible shunt tubing. No acute scalp soft tissue finding. Visualized orbit soft tissues are within normal limits. No acute osseous abnormality identified.  Stable ventriculostomy catheter, terminating at the body of the right lateral ventricle. Stable ventricle size and configuration. , the right lateral ventricle remains decompressed.  Incidental small left tentorial lipoma. No midline shift, mass effect, or evidence of intracranial mass lesion. Scattered subcortical white matter hypodensity is stable, nonspecific. No acute intracranial hemorrhage identified. No evidence of cortically based acute infarction identified. No suspicious intracranial  vascular hyperdensity.  IMPRESSION: No acute intracranial abnormality.  Stable right frontal approach CSF shunt and ventricle configuration. Nonspecific white matter changes.   Electronically Signed   By: Lars Pinks M.D.   On: 11/30/2014 16:13   Ct  Head Wo Contrast  11/16/2014   CLINICAL DATA:  Unwitnessed fall at home today, found on floor at 1800 hr, history stroke, hypertension, coronary artery disease, TIA, type 2 diabetes  EXAM: CT HEAD WITHOUT CONTRAST  CT CERVICAL SPINE WITHOUT CONTRAST  TECHNIQUE: Multidetector CT imaging of the head and cervical spine was performed following the standard protocol without intravenous contrast. Multiplanar CT image reconstructions of the cervical spine were also generated.  COMPARISON:  CT head 07/12/2014  FINDINGS: CT HEAD FINDINGS  VP shunt tubing via RIGHT frontal approach with tip in RIGHT lateral ventricle at foramina [place].  Normal ventricular morphology unchanged from previous study.  No midline shift or mass effect.  Otherwise normal appearance of brain parenchyma.  No intracranial hemorrhage, mass lesion or evidence acute infarction.  No extra-axial fluid collections.  Bones and sinuses unremarkable.  CT CERVICAL SPINE FINDINGS  Visualized skullbase intact.  Prevertebral soft tissues normal thickness.  Minimal scattered disc space narrowing.  Vertebral body heights maintained.  No acute fracture, subluxation or bone destruction.  Lung apices clear.  IMPRESSION: Stable VP shunt.  No acute intracranial abnormalities.  No acute cervical spine abnormalities.   Electronically Signed   By: Lavonia Dana M.D.   On: 11/16/2014 22:52   Ct Cervical Spine Wo Contrast  11/16/2014   CLINICAL DATA:  Unwitnessed fall at home today, found on floor at 1800 hr, history stroke, hypertension, coronary artery disease, TIA, type 2 diabetes  EXAM: CT HEAD WITHOUT CONTRAST  CT CERVICAL SPINE WITHOUT CONTRAST  TECHNIQUE: Multidetector CT imaging of the head and cervical spine was  performed following the standard protocol without intravenous contrast. Multiplanar CT image reconstructions of the cervical spine were also generated.  COMPARISON:  CT head 07/12/2014  FINDINGS: CT HEAD FINDINGS  VP shunt tubing via RIGHT frontal approach with tip in RIGHT lateral ventricle at foramina [place].  Normal ventricular morphology unchanged from previous study.  No midline shift or mass effect.  Otherwise normal appearance of brain parenchyma.  No intracranial hemorrhage, mass lesion or evidence acute infarction.  No extra-axial fluid collections.  Bones and sinuses unremarkable.  CT CERVICAL SPINE FINDINGS  Visualized skullbase intact.  Prevertebral soft tissues normal thickness.  Minimal scattered disc space narrowing.  Vertebral body heights maintained.  No acute fracture, subluxation or bone destruction.  Lung apices clear.  IMPRESSION: Stable VP shunt.  No acute intracranial abnormalities.  No acute cervical spine abnormalities.   Electronically Signed   By: Lavonia Dana M.D.   On: 11/16/2014 22:52    Microbiology: Recent Results (from the past 240 hour(s))  Urine culture     Status: None   Collection Time: 11/30/14  4:49 PM  Result Value Ref Range Status   Specimen Description URINE, RANDOM  Final   Special Requests ADDED [id number]  Final   Culture  Setup Time   Final    12/01/2014 04:25 Performed at [place] Performed at [facility]   Final   Culture NO GROWTH Performed at [facility]   Final   Report Status 12/02/2014 FINAL  Final     Labs: Basic Metabolic Panel:  Recent Labs Lab 11/30/14 1241 12/01/14 0600 12/02/14 0550  NA 139 137 137  K 3.6 3.2* 3.4*  CL 102 100 103  CO2 27 27 26   GLUCOSE 164* 118* 108*  BUN 16 11 11   CREATININE 1.25 0.99 1.02  CALCIUM 9.6 8.8 9.0   Liver  Function Tests:  Recent Labs Lab 11/30/14 1630 12/01/14 0600  AST 26 23  ALT 19 18  ALKPHOS 135* 127*  BILITOT 0.7 1.0   PROT 7.1 6.9  ALBUMIN 4.1 3.7   No results for input(s): LIPASE, AMYLASE in the last 168 hours. No results for input(s): AMMONIA in the last 168 hours. CBC:  Recent Labs Lab 11/30/14 1241 12/01/14 0600  WBC 7.6 5.8  HGB 11.4* 10.9*  HCT 36.2* 35.1*  MCV 86.4 87.1  PLT 314 294   Cardiac Enzymes:  Recent Labs Lab 11/30/14 1544 12/01/14 0600  CKTOTAL 762* 743*  CKMB  --  3.5   BNP: BNP (last 3 results) No results for input(s): PROBNP in the last 8760 hours. CBG:  Recent Labs Lab 12/01/14 1656 12/01/14 2142 12/02/14 0748 12/02/14 1149  GLUCAP 105* 101* 84 90       Signed:  Linsi Humann N  [facility] 12/02/2014, 2:24 PM

## 2014-12-02 NOTE — Clinical Social Work Note (Signed)
CSW spoke with patient's wife by phone as no family currently at bedside. CSW informed wife that patient is medically stable for discharge and unfortunately Medicare will not cover a SNF stay since the patient has not had a qualifying 3 night stay. CSW offered to assist with private pay placement, but wife has opted to take patient home with home health services and family assistance. CSW has informed RNCM of this. Patient's wife states she will come to hospital to pick the patient up once she is off of work. CSW signing off at this time.  Liz Beach MSW, [place], [place], [phone]

## 2014-12-04 ENCOUNTER — Other Ambulatory Visit: Payer: Self-pay | Admitting: Physician Assistant

## 2014-12-06 ENCOUNTER — Other Ambulatory Visit: Payer: Self-pay | Admitting: Cardiovascular Disease

## 2014-12-07 DIAGNOSIS — Z431 Encounter for attention to gastrostomy: Secondary | ICD-10-CM | POA: Diagnosis not present

## 2014-12-07 NOTE — Telephone Encounter (Signed)
This should be refilled by primary care. Thanks,chris

## 2014-12-07 NOTE — Telephone Encounter (Signed)
Primary care to refill. cdm

## 2014-12-27 ENCOUNTER — Other Ambulatory Visit: Payer: Self-pay | Admitting: Physician Assistant

## 2014-12-27 ENCOUNTER — Other Ambulatory Visit: Payer: Self-pay | Admitting: Cardiovascular Disease

## 2015-01-05 ENCOUNTER — Encounter: Payer: Self-pay | Admitting: Internal Medicine

## 2015-01-06 ENCOUNTER — Other Ambulatory Visit: Payer: Self-pay | Admitting: Internal Medicine

## 2015-01-06 DIAGNOSIS — I69359 Hemiplegia and hemiparesis following cerebral infarction affecting unspecified side: Secondary | ICD-10-CM

## 2015-01-11 ENCOUNTER — Other Ambulatory Visit: Payer: Self-pay | Admitting: *Deleted

## 2015-01-11 MED ORDER — POTASSIUM CHLORIDE CRYS ER 20 MEQ PO TBCR: 20.0000 meq | EXTENDED_RELEASE_TABLET | Freq: Three times a day (TID) | ORAL | Status: DC

## 2015-01-18 ENCOUNTER — Encounter: Payer: Self-pay | Admitting: Cardiovascular Disease

## 2015-01-19 ENCOUNTER — Telehealth: Payer: Self-pay

## 2015-01-19 NOTE — Telephone Encounter (Signed)
Pt wife called in, Pt is not eating, not taking night time meds and having a hard time getting him to any meds.  What does she need to to do?     Wife [phone]

## 2015-01-20 ENCOUNTER — Telehealth: Payer: Self-pay | Admitting: Cardiovascular Disease

## 2015-01-20 NOTE — Telephone Encounter (Signed)
Spoke with pt's wife and confirmed potassium dose is K-Dur 20 meq by mouth three times daily. Wife reports they are having trouble getting pt to take many of his  medications.  Wife had placed call to primary care to discuss trouble getting pt to take medications.  Will forward this note to primary care CMA for follow up.  Wife also aware pt needs to schedule follow up with primary care per hospital discharge instructions.

## 2015-01-20 NOTE — Telephone Encounter (Signed)
Fraser Din, can you check on this today? I am not sure what the question is. Thanks, Gerald Stabs

## 2015-01-20 NOTE — Telephone Encounter (Signed)
Question was regarding potassium dosage. Hospital discharge note from 12/02/14 indicates pt to take K-Dur 20 meq by mouth three times daily. This refill was sent to pharmacy on 2/2.  Note from refill team indicates pt states he is taking four times daily.  Phone call to primary care was attached to this note. I placed call to pt's wife and left message to call back

## 2015-01-20 NOTE — Telephone Encounter (Signed)
Spoke with pharmacist and told her discharge instructions state pt was discharged from hospital on K-Dur 20 meq three times daily.

## 2015-01-20 NOTE — Telephone Encounter (Signed)
New message     Calling to get clarification on potassium medication----is it 1 tab three times a day or 1 tablet four times a day.  Please call

## 2015-01-20 NOTE — Telephone Encounter (Signed)
See phone note dated 2/10 which addresses this.

## 2015-01-20 NOTE — Telephone Encounter (Signed)
New Msg         Pt wife returning call, please call back.

## 2015-02-02 ENCOUNTER — Ambulatory Visit: Payer: BC Managed Care – PPO | Attending: Internal Medicine

## 2015-02-02 ENCOUNTER — Ambulatory Visit: Payer: BC Managed Care – PPO

## 2015-02-02 DIAGNOSIS — R29898 Other symptoms and signs involving the musculoskeletal system: Secondary | ICD-10-CM | POA: Diagnosis not present

## 2015-02-02 DIAGNOSIS — R269 Unspecified abnormalities of gait and mobility: Secondary | ICD-10-CM | POA: Diagnosis not present

## 2015-02-02 DIAGNOSIS — R5381 Other malaise: Secondary | ICD-10-CM | POA: Insufficient documentation

## 2015-02-02 DIAGNOSIS — R41841 Cognitive communication deficit: Secondary | ICD-10-CM | POA: Insufficient documentation

## 2015-02-02 NOTE — Patient Instructions (Signed)
Try simple motor tasks with Tony Davis to increase his attention span, without distractions.  Sorting cards into suits  Playing cards ("war", "solitaire", "old maid", or "memory"-with 6-8 cards.)  Folding laundry  Match socks  Put away silverware  Count pennies or dollar bills

## 2015-02-02 NOTE — Therapy (Signed)
[facility] [address] [place], [place], [address] Phone: [phone]   Fax:  [phone]  Physical Therapy Evaluation  Patient Details  Name: Tony Davis MRN: [id number] Date of Birth: 1961-02-12 Referring Provider:  Janith Lima, MD  Encounter Date: 02/02/2015      PT End of Session - 02/02/15 1415    Visit Number 1   Number of Visits 17   Date for PT Re-Evaluation 04/03/15   Authorization Type BCBS   PT Start Time 1102   PT Stop Time 1148   PT Time Calculation (min) 46 min   Equipment Utilized During Treatment Gait belt   Activity Tolerance Other (comment)  pt limited due to difficulty attending to task and falling asleep in chair.   Behavior During Therapy Flat affect      Past Medical History  Diagnosis Date  . GERD (gastroesophageal reflux disease)   . HLD (hyperlipidemia)   . HTN (hypertension)   . Hematuria 8/12  . PFO (patent foramen ovale)     a. echo 8/12: EF 55-65%, grade 2 diast dysfnx; +PFO on bubble study. b. TEE 4/13: EF normal, atrial septum with suspicion for interatrial septum fenestrations without flow across and few large bubbles noted in LA.  This was not felt to require closure   . CAD (coronary artery disease)     a. s/p promus DES circumflex artery 10/13/10. b. Cath 04/2013: stable disease but possible small vessel disease -Imdur added.  Marland Kitchen DVT (deep venous thrombosis) 2013    a. 2013. b. 2015 - reported BLE DVT s/p IVC filter.   Marland Kitchen TIA (transient ischemic attack)     "1-2; both after the one in 02/2010" (09/25/2013)  . Type II diabetes mellitus   . Arthritis     "knees" (09/25/2013)  . Rheumatoid arthritis(714.0)   . Folate deficiency anemia   . Carotid artery disease     a. 1-29% BICA by study 04/2014.  Marland Kitchen BPH (benign prostatic hyperplasia)   . Basal ganglia hemorrhage 2015    a. 2015 - with intraventricular extension and hydrocephalus requiring right crani and VP shunt,  complicated by encephalopathy, protein calorie malnutrition, acute resp failure req trache and PEG, BLE DVT s/p IVC, enterobacter PNA, subsequent right hemiparesis and difficulty communicating.  Marland Kitchen Hydrocephalus 2015    a. 2/2 BG hemorrhage.  . Acute respiratory failure 2015    a. Following BG hemorrhage - s/p trache with decannulation.  . Enterobacter cloacae pneumonia 2015  . S/P percutaneous endoscopic gastrostomy (PEG) tube placement 2015  . Protein calorie malnutrition   . LV dysfunction     a. 2011 - EF 45-50%, subsequent 55-60% in 04/2014.  Marland Kitchen Noncompliance   . Falls   . Urinary incontinence   . CVA (cerebral vascular accident) 02/2010    a. CVA 2011, prior reported TIAs. b. CNS hemorrhage 2015.  Marland Kitchen CVA (cerebral vascular accident) 06/2014    "hemorrhagic; in [place]; brain activity diminshed; ability to do anything; can't do ADLs on own"  . OSA on CPAP     "not using CPAP right now" (11/30/2014)  . Incontinence of feces     Past Surgical History  Procedure Laterality Date  . Knee arthroscopy Bilateral 2000's    2 on left and 3 on rt  . Tee without cardioversion  03/21/2012    Procedure: TRANSESOPHAGEAL ECHOCARDIOGRAM (TEE);  Surgeon: Fay Records, MD;  Location: [facility];  Service: Cardiovascular;  Laterality: N/A;  .  Coronary angioplasty with stent placement  2012    "1" (09/24/2013)  . Left heart catheterization with coronary angiogram N/A 04/24/2013    Procedure: LEFT HEART CATHETERIZATION WITH CORONARY ANGIOGRAM;  Surgeon: Burnell Blanks, MD;  Location: [facility] CATH LAB;  Service: Cardiovascular;  Laterality: N/A;    There were no vitals taken for this visit.  Visit Diagnosis:  Abnormality of gait - Plan: PT plan of care cert/re-cert  Weakness of both lower extremities - Plan: PT plan of care cert/re-cert  Debility - Plan: PT plan of care cert/re-cert      Subjective Assessment - 02/02/15 1119    Symptoms Difficulty walking, impaired balance, R-sided weakness    Pertinent History past CVAs, 06/2014 CVA, diabetes, memory and attention deficits, B DVTs and is s/p IVC filter placement   Patient Stated Goals "Do what I normally do", improve balance and walk better   Currently in Pain? No/denies          [facility] PT Assessment - 02/02/15 1121    Assessment   Medical Diagnosis CVA   Onset Date 06/13/14   Prior Therapy CIR, SNF and HHPT for 06/2014 CVA, and OPPT neuro for prior CVA 5-6 years ago   Precautions   Precautions Fall   Restrictions   Weight Bearing Restrictions No   Balance Screen   Has the patient fallen in the past 6 months Yes   How many times? 4   Has the patient had a decrease in activity level because of a fear of falling?  No   Is the patient reluctant to leave their home because of a fear of falling?  No   Home Environment   Living Enviornment Private residence   Living Arrangements Spouse/significant other  [name] and her daughter   Available Help at Discharge Family   Type of [place] to enter   Entrance Stairs-Number of Steps 2   Entrance Stairs-Rails Can reach both   [place] One level   [place] - 2 wheels;Wheelchair - Brewing technologist;Tub bench;Cane - single point;Bedside commode;Other (comment)  rail on bed   Prior Function   Level of Independence Requires assistive device for independence;Independent with basic ADLs   Vocation On disability   Leisure watch sports, vacation, spend time with grandkids   Cognition   Overall Cognitive Status Impaired/Different from baseline   Area of Impairment Memory;Safety/judgement;Awareness;Following commands;Problem solving   Memory Comments Impaired    Following Commands Follows one step commands consistently   Safety/Judgement Decreased awareness of safety;Decreased awareness of deficits   Problem Solving Slow processing;Requires verbal cues   Behaviors --  Pt believes that other people are at his house.   Sensation   Light Touch  Appears Intact   Coordination   Gross Motor Movements are Fluid and Coordinated Yes   Fine Motor Movements are Fluid and Coordinated No  difficulty with finger to thumb with B hands   Posture/Postural Control   Posture/Postural Control Postural limitations   Postural Limitations Rounded Shoulders;Forward head   Tone   Assessment Location Left Lower Extremity  pt's wife reports this is from past CVA.   ROM / Strength   AROM / PROM / Strength AROM;Strength   AROM   Overall AROM  Within functional limits for tasks performed   Strength   Overall Strength Deficits   Overall Strength Comments L LE grossly 4/5-weakness from past CVA. R hip flexion: 2/5, knee ext: 4/5, knee flex: 3+/5, ankle dorsiflexion:  3/5   Transfers   Transfers Sit to Stand;Stand to Sit   Sit to Stand 4: Min assist;With upper extremity assist;From chair/3-in-1   Sit to Stand Details (indicate cue type and reason) Min A and cues to improve anterior weight shifting and sequencing.   Stand to Sit 4: Min guard;With upper extremity assist;To chair/3-in-1   Ambulation/Gait   Ambulation/Gait Yes   Ambulation/Gait Assistance 4: Min guard   Ambulation Distance (Feet) 100 Feet   Assistive device Rolling walker   Gait Pattern Decreased stride length;Decreased dorsiflexion - right;Decreased dorsiflexion - left;Decreased hip/knee flexion - right;Step-through pattern;Trunk flexed  B foot IR, pt's wife reported this is chronic   Ambulation Surface Level;Indoor   Gait velocity 1.46ft/sec.  with RW   Balance   Balance Assessed Yes   Static Standing Balance   Static Standing - Balance Support No upper extremity supported   Static Standing - Level of Assistance Other (comment)  min guard   Static Standing - Comment/# of Minutes Pt was able to stand with feet apart and feet together for 30 seconds without LOB, pt able to perform B single leg stance for 1 sec/LE before requiring min A to maintain balance.   Standardized Balance  Assessment   Standardized Balance Assessment Timed Up and Go Test   Timed Up and Go Test   TUG Normal TUG   Normal TUG (seconds) 46.85  with RW   LLE Tone   LLE Tone Hypertonic   LLE Tone   Hypertonic Details hamstrings                            PT Short Term Goals - 02/02/15 1421    PT SHORT TERM GOAL #1   Title Pt will perform HEP with cues from wife, in order to improve strength, balance, and endurance. Target date: 03/02/15.   Status New   PT SHORT TERM GOAL #2   Title Pt will ambulate 300' with LRAD, and supervision, over even/uneven terrain to improve functional mobility. Target date: 03/02/15.   Status New   PT SHORT TERM GOAL #3   Title Pt will improve TUG time to </=36 seconds to decrease falls risk. Target date: 03/02/15.   Status New   PT SHORT TERM GOAL #4   Title Perform BERG and write STG/LTGs if appropriate. Target date: 03/02/15.   Status New   PT SHORT TERM GOAL #5   Title Pt will perform sit<>stand transfers with LRAD and min guard to improve functional mobility. Target date: 03/02/15.   Status New           PT Long Term Goals - 02/02/15 1424    PT LONG TERM GOAL #1   Title Pt will be able to verbalize CVA symptoms/risk factors with cues from wife to decrease risk of CVA. Target date: 03/30/15.   Status New   PT LONG TERM GOAL #2   Title Pt will ambulate 500' over even/uneven terrain with LRAD at MOD I level to improve functional mobility. Target date: 03/30/15.   Status New   PT LONG TERM GOAL #3   Title Pt will improve gait speed with LRAD to >/=1.91ft/sec. to decrease recurrent falls risk. Target date: 03/30/15.   Status New   PT LONG TERM GOAL #4   Title Pt will improve TUG time with LRAD to </=30 seconds to decrease falls risk. Target date: 03/30/15.   Status New   PT LONG TERM GOAL #  5   Title Pt will perform all sit<>stand transfers with LRAD and supervision to improve functional mobility. Target date: 03/30/15.   Status New    Additional Long Term Goals   Additional Long Term Goals Yes   PT LONG TERM GOAL #6   Title Pt will ascend/descend 2 steps with B handrails and supervision to traverse steps at home safely. Target date: 03/30/15.   Status New               Plan - 02/02/15 1112    Clinical Impression Statement Pt is a 54y/o male presenting to OPPT neuro s/p 06/2014 basal ganglia hemorrhage (CVA). Pt with history of prior CVA. Pt's wife, Richardson Chiquito, reported he has received OPPT neuro for previous CVA about 5-6 years. Pt received PT at [facility] through [place] for appro.x 3 weeks and CIR for approx. 4 weeks. Pt with recent hospital visit in 11/2014 due to decline in function since d/c from rehab. Pt also stayed at [facility] for 4 weeks after d/c from CIR, and had HHPT after d/c from [facility]. Pt's wife reported he has difficulty with transfers , impaired balance, and difficulty walking. Pt has extensive memory loss per wife and noted to experience difficulty attending to task.   Pt will benefit from skilled therapeutic intervention in order to improve on the following deficits Abnormal gait;Decreased endurance;Decreased safety awareness;Decreased knowledge of use of DME;Decreased balance;Decreased cognition;Decreased mobility;Decreased strength   Rehab Potential Fair   Clinical Impairments Affecting Rehab Potential memory and attention deficits   PT Frequency 2x / week   PT Duration 8 weeks   PT Treatment/Interventions ADLs/Self Care Home Management;Gait training;Neuromuscular re-education;Stair training;Patient/family education;Functional mobility training;Biofeedback;Therapeutic activities;Wheelchair mobility training;Therapeutic exercise;Manual techniques;Electrical Stimulation;DME Instruction;Balance training   PT Next Visit Plan Perform BERG, initiate strength/balance HEP.   Recommended Other Services --   Consulted and Agree with Plan of Care Patient;Family member/caregiver   Family Member  Consulted pt's wife: [name]         Problem List Patient Active Problem List   Diagnosis Date Noted  . Anasarca 11/30/2014  . Physical deconditioning 11/30/2014  . Bilateral leg edema 11/30/2014  . Memory loss 11/30/2014  . UTI (urinary tract infection) 11/30/2014  . Diastolic dysfunction with chronic heart failure 11/30/2014  . Dizziness   . UTI (lower urinary tract infection)   . Essential hypertension 11/03/2014  . Noncompliance   . LV dysfunction   . Protein calorie malnutrition   . S/P percutaneous endoscopic gastrostomy (PEG) tube placement   . Enterobacter cloacae pneumonia   . Acute respiratory failure   . Hydrocephalus   . PEG tube malfunction 10/27/2014  . Basal ganglia hemorrhage 07/30/2014  . Medication adverse effect 04/15/2014  . Routine general medical examination at a health care facility 02/11/2014  . BPH associated with nocturia 02/11/2014  . Gynecomastia 02/11/2014  . Insomnia, persistent 03/25/2013  . History of tobacco use 03/25/2013  . Chronic rhinitis 03/25/2013  . Hemiparesis due to old cerebral infarction 03/20/2013  . Folate deficiency anemia 10/13/2012  . Long term (current) use of anticoagulants 03/28/2012  . DVT (deep venous thrombosis) 03/19/2012  . Obstructive sleep apnea 08/16/2011  . Type II diabetes mellitus with manifestations 08/06/2011  . PATENT FORAMEN OVALE 03/13/2010  . History of CVA (cerebrovascular accident) 02/08/2010  . CAD, NATIVE VESSEL 01/26/2010  . Hyperlipidemia with target LDL less than 100 01/23/2010  . GERD 01/23/2010    Miller,Jennifer L 02/02/2015, 2:37 PM  [facility] [address] [place], [place], [address] Phone: [phone]   Fax:  [phone]    Geoffry Paradise, PT,DPT 02/02/2015 2:37 PM Phone: [phone] Fax: [phone]

## 2015-02-02 NOTE — Therapy (Signed)
[facility] [address] [place], [place], [address] Phone: [phone]   Fax:  [phone]  Speech Language Pathology Evaluation  Patient Details  Name: Tony Davis MRN: [id number] Date of Birth: November 08, 1961 Referring Provider:  Janith Lima, MD  Encounter Date: 02/02/2015      End of Session - 02/02/15 1404    Visit Number 1   Number of Visits 16  possible 8 visits due to lack of progress   Date for SLP Re-Evaluation 04/02/15   SLP Start Time 1149   SLP Stop Time  1231   SLP Time Calculation (min) 42 min   Activity Tolerance Other (comment)  limited by severe-profound attention defiict      Past Medical History  Diagnosis Date  . GERD (gastroesophageal reflux disease)   . HLD (hyperlipidemia)   . HTN (hypertension)   . Hematuria 8/12  . PFO (patent foramen ovale)     a. echo 8/12: EF 55-65%, grade 2 diast dysfnx; +PFO on bubble study. b. TEE 4/13: EF normal, atrial septum with suspicion for interatrial septum fenestrations without flow across and few large bubbles noted in LA.  This was not felt to require closure   . CAD (coronary artery disease)     a. s/p promus DES circumflex artery 10/13/10. b. Cath 04/2013: stable disease but possible small vessel disease -Imdur added.  Marland Kitchen DVT (deep venous thrombosis) 2013    a. 2013. b. 2015 - reported BLE DVT s/p IVC filter.   Marland Kitchen TIA (transient ischemic attack)     "1-2; both after the one in 02/2010" (09/25/2013)  . Type II diabetes mellitus   . Arthritis     "knees" (09/25/2013)  . Rheumatoid arthritis(714.0)   . Folate deficiency anemia   . Carotid artery disease     a. 1-29% BICA by study 04/2014.  Marland Kitchen BPH (benign prostatic hyperplasia)   . Basal ganglia hemorrhage 2015    a. 2015 - with intraventricular extension and hydrocephalus requiring right crani and VP shunt, complicated by encephalopathy, protein calorie malnutrition, acute resp failure req trache  and PEG, BLE DVT s/p IVC, enterobacter PNA, subsequent right hemiparesis and difficulty communicating.  Marland Kitchen Hydrocephalus 2015    a. 2/2 BG hemorrhage.  . Acute respiratory failure 2015    a. Following BG hemorrhage - s/p trache with decannulation.  . Enterobacter cloacae pneumonia 2015  . S/P percutaneous endoscopic gastrostomy (PEG) tube placement 2015  . Protein calorie malnutrition   . LV dysfunction     a. 2011 - EF 45-50%, subsequent 55-60% in 04/2014.  Marland Kitchen Noncompliance   . Falls   . Urinary incontinence   . CVA (cerebral vascular accident) 02/2010    a. CVA 2011, prior reported TIAs. b. CNS hemorrhage 2015.  Marland Kitchen CVA (cerebral vascular accident) 06/2014    "hemorrhagic; in [place]; brain activity diminshed; ability to do anything; can't do ADLs on own"  . OSA on CPAP     "not using CPAP right now" (11/30/2014)  . Incontinence of feces     Past Surgical History  Procedure Laterality Date  . Knee arthroscopy Bilateral 2000's    2 on left and 3 on rt  . Tee without cardioversion  03/21/2012    Procedure: TRANSESOPHAGEAL ECHOCARDIOGRAM (TEE);  Surgeon: Fay Records, MD;  Location: [facility] ENDOSCOPY;  Service: Cardiovascular;  Laterality: N/A;  . Coronary angioplasty with stent placement  2012    "1" (09/24/2013)  . Left heart catheterization with  coronary angiogram N/A 04/24/2013    Procedure: LEFT HEART CATHETERIZATION WITH CORONARY ANGIOGRAM;  Surgeon: Burnell Blanks, MD;  Location: [facility] CATH LAB;  Service: Cardiovascular;  Laterality: N/A;    There were no vitals taken for this visit.  Visit Diagnosis: Cognitive communication deficit      Subjective Assessment - 02/02/15 1349    Symptoms Pt did not follow directions for entering third door on right.   Currently in Pain? No/denies          SLP Evaluation [facility] - 02/02/15 1349    SLP Visit Information   Onset Date July 2015   Medical Diagnosis CVA   General Information   HPI Pt with CVA 2011, July 2015 basal ganglia  CVA. Complicated by BLE DVT and respiratory failure requiring trach and PEG. Pt developed enterobacterial PNA required IVC placement.   Behavioral/Cognition Flat affect, no eye contact with SLP during limited conversation.   Mobility Status Walker   Prior Functional Status   Cognitive/Linguistic Baseline Within functional limits  prior to July 2015   Vocation On disability   Cognition   Overall Cognitive Status Impaired/Different from baseline   Memory Comments Impaired    Attention Sustained;Selective   Sustained Attention Impaired   Selective Attention Impaired   Memory Impaired   Memory Impairment Storage deficit   Awareness Impaired   Awareness Impairment Intellectual impairment   Behaviors Confabulation  telling wife he drove this week             SLP Education - 02/02/15 1516    Education Details --  conversation for 12 minutes          SLP Short Term Goals - 02/02/15 1509    SLP SHORT TERM GOAL #1   Title pt will attend to a spoken therapy task for 3 minutes   Baseline 60-90 seconds   Time 4   Period Weeks   Status New   SLP SHORT TERM GOAL #2   Title pt will attend to an auditory therapy task for 90 seconds   Baseline 30-45 seconds   Time 4   Period Weeks   Status New   SLP SHORT TERM GOAL #3   Title pt will attend to written therapy task for 60 seconds   Time 4   Period Weeks   Status New          SLP Long Term Goals - 02/02/15 1511    SLP LONG TERM GOAL #1   Title pt will attend to spoken therapy task for 4 minutes   Time 8   Period Weeks   Status New   SLP LONG TERM GOAL #2   Title pt attend to written therapy task for 2 minutes   Time 8   Period Weeks   Status New   SLP LONG TERM GOAL #3   Title pt to attend to auditory therapy task for 3 minutes   Time 8   Period Weeks   Status New          Plan - 02/02/15 1406    Clinical Impression Statement Pt presents with severe-profound cognitive communication deficits, most notably  sustained attention.   Speech Therapy Frequency 2x / week   Duration --  8 weeks, with possibility of 4 weeks given possible lack of progress   Treatment/Interventions Compensatory techniques;Cueing hierarchy;Functional tasks;Internal/external aids;Patient/family education;Cognitive reorganization;SLP instruction and feedback   Potential to Achieve Goals Fair   Potential Considerations Ability to learn/carryover information;Severity of impairments  G-Codes - 02/02/15 1514    Functional Assessment Tool Used noms-3 (initial)   Functional Limitations Attention   Attention Current Status (N8676) At least 60 percent but less than 80 percent impaired, limited or restricted   Attention Goal Status (H2094) At least 60 percent but less than 80 percent impaired, limited or restricted      Problem List Patient Active Problem List   Diagnosis Date Noted  . Anasarca 11/30/2014  . Physical deconditioning 11/30/2014  . Bilateral leg edema 11/30/2014  . Memory loss 11/30/2014  . UTI (urinary tract infection) 11/30/2014  . Diastolic dysfunction with chronic heart failure 11/30/2014  . Dizziness   . UTI (lower urinary tract infection)   . Essential hypertension 11/03/2014  . Noncompliance   . LV dysfunction   . Protein calorie malnutrition   . S/P percutaneous endoscopic gastrostomy (PEG) tube placement   . Enterobacter cloacae pneumonia   . Acute respiratory failure   . Hydrocephalus   . PEG tube malfunction 10/27/2014  . Basal ganglia hemorrhage 07/30/2014  . Medication adverse effect 04/15/2014  . Routine general medical examination at a health care facility 02/11/2014  . BPH associated with nocturia 02/11/2014  . Gynecomastia 02/11/2014  . Insomnia, persistent 03/25/2013  . History of tobacco use 03/25/2013  . Chronic rhinitis 03/25/2013  . Hemiparesis due to old cerebral infarction 03/20/2013  . Folate deficiency anemia 10/13/2012  . Long term (current) use of  anticoagulants 03/28/2012  . DVT (deep venous thrombosis) 03/19/2012  . Obstructive sleep apnea 08/16/2011  . Type II diabetes mellitus with manifestations 08/06/2011  . PATENT FORAMEN OVALE 03/13/2010  . History of CVA (cerebrovascular accident) 02/08/2010  . CAD, NATIVE VESSEL 01/26/2010  . Hyperlipidemia with target LDL less than 100 01/23/2010  . GERD 01/23/2010    [facility], SLP 02/02/2015, 3:17 PM  [facility] [address] [place], [place], [address] Phone: [phone]   Fax:  [phone]

## 2015-02-03 ENCOUNTER — Encounter: Payer: Self-pay | Admitting: Internal Medicine

## 2015-02-03 ENCOUNTER — Ambulatory Visit (INDEPENDENT_AMBULATORY_CARE_PROVIDER_SITE_OTHER): Payer: BC Managed Care – PPO | Admitting: Internal Medicine

## 2015-02-03 ENCOUNTER — Telehealth: Payer: Self-pay | Admitting: Internal Medicine

## 2015-02-03 ENCOUNTER — Encounter: Payer: Self-pay | Admitting: Occupational Therapy

## 2015-02-03 ENCOUNTER — Ambulatory Visit: Payer: BC Managed Care – PPO | Admitting: Occupational Therapy

## 2015-02-03 VITALS — BP 150/96 | HR 86 | Temp 98.0°F | Resp 20 | Ht 71.0 in | Wt 287.1 lb

## 2015-02-03 VITALS — BP 159/108

## 2015-02-03 DIAGNOSIS — M6281 Muscle weakness (generalized): Secondary | ICD-10-CM

## 2015-02-03 DIAGNOSIS — I10 Essential (primary) hypertension: Secondary | ICD-10-CM | POA: Diagnosis not present

## 2015-02-03 DIAGNOSIS — F329 Major depressive disorder, single episode, unspecified: Secondary | ICD-10-CM

## 2015-02-03 DIAGNOSIS — Z8673 Personal history of transient ischemic attack (TIA), and cerebral infarction without residual deficits: Secondary | ICD-10-CM

## 2015-02-03 DIAGNOSIS — F32A Depression, unspecified: Secondary | ICD-10-CM

## 2015-02-03 MED ORDER — ESCITALOPRAM OXALATE 10 MG PO TABS: 10.0000 mg | ORAL_TABLET | Freq: Every day | ORAL | Status: DC

## 2015-02-03 MED ORDER — AMLODIPINE BESYLATE 5 MG PO TABS: 5.0000 mg | ORAL_TABLET | Freq: Every day | ORAL | Status: DC

## 2015-02-03 NOTE — Progress Notes (Signed)
Subjective:    Patient ID: Tony Davis, male    DOB: 1961-09-24, 54 y.o.   MRN: [id number]  HPI Here to f/u, PCP Dr [name], with BP elevated noted this week consistently at home 140-150sbp, 95-100, checked with a new BP monitor a family member donated.  Pt denies chest pain, increased sob or doe, wheezing, orthopnea, PND, increased LE swelling, palpitations, dizziness or syncopw.  Pt denies new neurological symptoms such as new headache, or facial or extremity weakness or numbness  Very complicated PMH. Has had several wk worsening depressive symptoms, no suicidal ideation, or panic Past Medical History  Diagnosis Date  . GERD (gastroesophageal reflux disease)   . HLD (hyperlipidemia)   . HTN (hypertension)   . Hematuria 8/12  . PFO (patent foramen ovale)     a. echo 8/12: EF 55-65%, grade 2 diast dysfnx; +PFO on bubble study. b. TEE 4/13: EF normal, atrial septum with suspicion for interatrial septum fenestrations without flow across and few large bubbles noted in LA.  This was not felt to require closure   . CAD (coronary artery disease)     a. s/p promus DES circumflex artery 10/13/10. b. Cath 04/2013: stable disease but possible small vessel disease -Imdur added.  Marland Kitchen DVT (deep venous thrombosis) 2013    a. 2013. b. 2015 - reported BLE DVT s/p IVC filter.   Marland Kitchen TIA (transient ischemic attack)     "1-2; both after the one in 02/2010" (09/25/2013)  . Type II diabetes mellitus   . Arthritis     "knees" (09/25/2013)  . Rheumatoid arthritis(714.0)   . Folate deficiency anemia   . Carotid artery disease     a. 1-29% BICA by study 04/2014.  Marland Kitchen BPH (benign prostatic hyperplasia)   . Basal ganglia hemorrhage 2015    a. 2015 - with intraventricular extension and hydrocephalus requiring right crani and VP shunt, complicated by encephalopathy, protein calorie malnutrition, acute resp failure req trache and PEG, BLE DVT s/p IVC, enterobacter PNA, subsequent right hemiparesis and difficulty  communicating.  Marland Kitchen Hydrocephalus 2015    a. 2/2 BG hemorrhage.  . Acute respiratory failure 2015    a. Following BG hemorrhage - s/p trache with decannulation.  . Enterobacter cloacae pneumonia 2015  . S/P percutaneous endoscopic gastrostomy (PEG) tube placement 2015  . Protein calorie malnutrition   . LV dysfunction     a. 2011 - EF 45-50%, subsequent 55-60% in 04/2014.  Marland Kitchen Noncompliance   . Falls   . Urinary incontinence   . CVA (cerebral vascular accident) 02/2010    a. CVA 2011, prior reported TIAs. b. CNS hemorrhage 2015.  Marland Kitchen CVA (cerebral vascular accident) 06/2014    "hemorrhagic; in [place]; brain activity diminshed; ability to do anything; can't do ADLs on own"  . OSA on CPAP     "not using CPAP right now" (11/30/2014)  . Incontinence of feces    Past Surgical History  Procedure Laterality Date  . Knee arthroscopy Bilateral 2000's    2 on left and 3 on rt  . Tee without cardioversion  03/21/2012    Procedure: TRANSESOPHAGEAL ECHOCARDIOGRAM (TEE);  Surgeon: Fay Records, MD;  Location: [facility] ENDOSCOPY;  Service: Cardiovascular;  Laterality: N/A;  . Coronary angioplasty with stent placement  2012    "1" (09/24/2013)  . Left heart catheterization with coronary angiogram N/A 04/24/2013    Procedure: LEFT HEART CATHETERIZATION WITH CORONARY ANGIOGRAM;  Surgeon: Burnell Blanks, MD;  Location: [facility] CATH LAB;  Service: Cardiovascular;  Laterality: N/A;    reports that he quit smoking about 2 years ago. His smoking use included Cigarettes. He has a 35 pack-year smoking history. He has never used smokeless tobacco. He reports that he does not drink alcohol or use illicit drugs. family history includes Heart attack in his father and sister; Hypertension in his brother. There is no history of Cancer, Kidney disease, Diabetes, or Stroke. Allergies  Allergen Reactions  . Iodine Anaphylaxis  . Amlodipine Swelling and Other (See Comments)    Foot edema   Current Outpatient Prescriptions  on File Prior to Visit  Medication Sig Dispense Refill  . acetaminophen (TYLENOL) 325 MG tablet Take 1-2 tablets (325-650 mg total) by mouth every 4 (four) hours as needed for mild pain or moderate pain.    Marland Kitchen atorvastatin (LIPITOR) 80 MG tablet Take 1 tablet (80 mg total) by mouth every evening.    . cephALEXin (KEFLEX) 250 MG/5ML suspension Take 10 mLs (500 mg total) by mouth every 8 (eight) hours. 100 mL 0  . chlorhexidine (PERIDEX) 0.12 % solution Use as directed 15 mLs in the mouth or throat 2 (two) times daily.    . ferrous sulfate 220 (44 FE) MG/5ML solution Give 7.90mL (350mg ) by mouth every day for anemia.    . fluticasone (FLONASE) 50 MCG/ACT nasal spray Place 2 sprays into both nostrils 2 (two) times daily.    . furosemide (LASIX) 40 MG tablet Take 1 tablet (40 mg total) by mouth daily. 30 tablet 6  . HYDROcodone-acetaminophen (NORCO/VICODIN) 5-325 MG per tablet Take 1 tablet by mouth every 4 (four) hours as needed. 15 tablet 0  . lisinopril (PRINIVIL,ZESTRIL) 5 MG tablet Take 1 tablet (5 mg total) by mouth daily. 30 tablet 6  . methylphenidate (RITALIN) 5 MG tablet Take 1 tablet (5 mg total) by mouth 2 (two) times daily.    . metoprolol succinate (TOPROL-XL) 50 MG 24 hr tablet Take 1 and a half tablets by mouth twice a day. Take with or immediately following a meal. (Patient taking differently: Take 75 mg by mouth 2 (two) times daily. Take 1 and a half tablets by mouth twice a day. Take with or immediately following a meal.) 90 tablet 6  . nitroGLYCERIN (NITROSTAT) 0.4 MG SL tablet Place 1 tablet (0.4 mg total) under the tongue every 5 (five) minutes as needed for chest pain (up to 3 doses). 25 tablet 3  . pantoprazole (PROTONIX) 40 MG tablet TAKE 1 TABLET BY MOUTH TWICE DAILY PRIOR TO MEALS 60 tablet 4  . potassium chloride SA (K-DUR,KLOR-CON) 20 MEQ tablet Take 1 tablet (20 mEq total) by mouth 3 (three) times daily. 90 tablet 3  . saccharomyces boulardii (FLORASTOR) 250 MG capsule Take  250 mg by mouth 2 (two) times daily.    Marland Kitchen senna-docusate (SENOKOT-S) 8.6-50 MG per tablet Take 1 tablet by mouth 2 (two) times daily as needed (constipation).    . Water For Irrigation, Sterile (FREE WATER) SOLN Place 50 mLs into feeding tube 2 times daily at 12 noon and 4 pm.     No current facility-administered medications on file prior to visit.   Review of Systems  Constitutional: Negative for unusual diaphoresis or other sweats  HENT: Negative for ringing in ear Eyes: Negative for double vision or worsening visual disturbance.  Respiratory: Negative for choking and stridor.   Gastrointestinal: Negative for vomiting or other signifcant bowel change Genitourinary: Negative for hematuria or decreased urine volume.  Musculoskeletal: Negative for  other MSK pain or swelling Skin: Negative for color change and worsening wound.  Neurological: Negative for tremors and numbness other than noted  Psychiatric/Behavioral: Negative for decreased concentration or agitation other than above       Objective:   Physical Exam BP 150/96 mmHg  Pulse 86  Temp(Src) 98 F (36.7 C) (Oral)  Resp 20  Ht 5\' 11"  (1.803 m)  Wt 287 lb 1.3 oz (130.219 kg)  BMI 40.06 kg/m2  SpO2 98% VS noted,  Constitutional: Pt appears well-developed, well-nourished.  HENT: Head: NCAT.  Right Ear: External ear normal.  Left Ear: External ear normal.  Eyes: . Pupils are equal, round, and reactive to light. Conjunctivae and EOM are normal Neck: Normal range of motion. Neck supple.  Cardiovascular: Normal rate and regular rhythm.   Pulmonary/Chest: Effort normal and breath sounds without rales or wheezing.  Neurological: Pt is alert. Not confused , motor grossly intact Skin: Skin is warm. No rash Psychiatric: Pt behavior is normal. No agitation. + deoressed affect    Assessment & Plan:

## 2015-02-03 NOTE — Patient Instructions (Signed)
Please take all new medication as prescribed - the amlodipine 5 mg per day for blood pressure, and the lexapro 10 mg for depression  Please continue all other medications as before, and refills have been done if requested.  Please have the pharmacy call with any other refills you may need.  Please keep your appointments with your specialists as you may have planned - cardiology  Please return in 6 months, or sooner if needed

## 2015-02-03 NOTE — Assessment & Plan Note (Signed)
Mild to mod, for lexapro 10 qd,  to f/u any worsening symptoms or concerns

## 2015-02-03 NOTE — Assessment & Plan Note (Addendum)
C/w mild uncontrolled, o/w stable overall by history and exam, recent data reviewed with pt, and pt to start amlod 5 qd ,  to f/u any worsening symptoms or concerns BP Readings from Last 3 Encounters:  02/03/15 150/96  02/03/15 159/108  12/02/14 156/95

## 2015-02-03 NOTE — Telephone Encounter (Signed)
Mrs. Tony Davis, [place], called stated Mr. Tall came in for OT eval today and his BP is 159/108 at rest. They are concern about this due to history of stroke. Pt is seeing Dr. Jenny Reichmann today 02/03/15 at 5:45pm. Not sure if Dr. [name] want to advise anything else.

## 2015-02-03 NOTE — Progress Notes (Signed)
Pre visit review using our clinic review tool, if applicable. No additional management support is needed unless otherwise documented below in the visit note. 

## 2015-02-03 NOTE — Therapy (Signed)
[facility] [address] [place], [place], [address] Phone: [phone]   Fax:  [phone]  Occupational Therapy Evaluation  Patient Details  Name: Tony Davis MRN: [id number] Date of Birth: Dec 18, 1960 Referring Provider:  Janith Lima, MD  Encounter Date: 02/03/2015      OT End of Session - 02/03/15 1607    OT Start Time 22   OT Stop Time 1605   OT Time Calculation (min) 35 min   Activity Tolerance Other (comment)  eval terminated due to high BP      Past Medical History  Diagnosis Date  . GERD (gastroesophageal reflux disease)   . HLD (hyperlipidemia)   . HTN (hypertension)   . Hematuria 8/12  . PFO (patent foramen ovale)     a. echo 8/12: EF 55-65%, grade 2 diast dysfnx; +PFO on bubble study. b. TEE 4/13: EF normal, atrial septum with suspicion for interatrial septum fenestrations without flow across and few large bubbles noted in LA.  This was not felt to require closure   . CAD (coronary artery disease)     a. s/p promus DES circumflex artery 10/13/10. b. Cath 04/2013: stable disease but possible small vessel disease -Imdur added.  Marland Kitchen DVT (deep venous thrombosis) 2013    a. 2013. b. 2015 - reported BLE DVT s/p IVC filter.   Marland Kitchen TIA (transient ischemic attack)     "1-2; both after the one in 02/2010" (09/25/2013)  . Type II diabetes mellitus   . Arthritis     "knees" (09/25/2013)  . Rheumatoid arthritis(714.0)   . Folate deficiency anemia   . Carotid artery disease     a. 1-29% BICA by study 04/2014.  Marland Kitchen BPH (benign prostatic hyperplasia)   . Basal ganglia hemorrhage 2015    a. 2015 - with intraventricular extension and hydrocephalus requiring right crani and VP shunt, complicated by encephalopathy, protein calorie malnutrition, acute resp failure req trache and PEG, BLE DVT s/p IVC, enterobacter PNA, subsequent right hemiparesis and difficulty communicating.  Marland Kitchen Hydrocephalus 2015    a. 2/2 BG  hemorrhage.  . Acute respiratory failure 2015    a. Following BG hemorrhage - s/p trache with decannulation.  . Enterobacter cloacae pneumonia 2015  . S/P percutaneous endoscopic gastrostomy (PEG) tube placement 2015  . Protein calorie malnutrition   . LV dysfunction     a. 2011 - EF 45-50%, subsequent 55-60% in 04/2014.  Marland Kitchen Noncompliance   . Falls   . Urinary incontinence   . CVA (cerebral vascular accident) 02/2010    a. CVA 2011, prior reported TIAs. b. CNS hemorrhage 2015.  Marland Kitchen CVA (cerebral vascular accident) 06/2014    "hemorrhagic; in [place]; brain activity diminshed; ability to do anything; can't do ADLs on own"  . OSA on CPAP     "not using CPAP right now" (11/30/2014)  . Incontinence of feces     Past Surgical History  Procedure Laterality Date  . Knee arthroscopy Bilateral 2000's    2 on left and 3 on rt  . Tee without cardioversion  03/21/2012    Procedure: TRANSESOPHAGEAL ECHOCARDIOGRAM (TEE);  Surgeon: Fay Records, MD;  Location: [facility] ENDOSCOPY;  Service: Cardiovascular;  Laterality: N/A;  . Coronary angioplasty with stent placement  2012    "1" (09/24/2013)  . Left heart catheterization with coronary angiogram N/A 04/24/2013    Procedure: LEFT HEART CATHETERIZATION WITH CORONARY ANGIOGRAM;  Surgeon: Burnell Blanks, MD;  Location: [facility] CATH LAB;  Service:  Cardiovascular;  Laterality: N/A;    BP 159/108 mmHg  Visit Diagnosis:  Generalized muscle weakness  Session terminated due to high BP. Wife states pt has been intermittently non compliant with BP meds at home.  Called MD office and wife headed to  MD office directly from here. Wife to call neuro outpatient to confirm or cancel next appointment based on MD response.                                 Problem List Patient Active Problem List   Diagnosis Date Noted  . Anasarca 11/30/2014  . Physical deconditioning 11/30/2014  . Bilateral leg edema 11/30/2014  . Memory loss 11/30/2014   . UTI (urinary tract infection) 11/30/2014  . Diastolic dysfunction with chronic heart failure 11/30/2014  . Dizziness   . UTI (lower urinary tract infection)   . Essential hypertension 11/03/2014  . Noncompliance   . LV dysfunction   . Protein calorie malnutrition   . S/P percutaneous endoscopic gastrostomy (PEG) tube placement   . Enterobacter cloacae pneumonia   . Acute respiratory failure   . Hydrocephalus   . PEG tube malfunction 10/27/2014  . Basal ganglia hemorrhage 07/30/2014  . Medication adverse effect 04/15/2014  . Routine general medical examination at a health care facility 02/11/2014  . BPH associated with nocturia 02/11/2014  . Gynecomastia 02/11/2014  . Insomnia, persistent 03/25/2013  . History of tobacco use 03/25/2013  . Chronic rhinitis 03/25/2013  . Hemiparesis due to old cerebral infarction 03/20/2013  . Folate deficiency anemia 10/13/2012  . Long term (current) use of anticoagulants 03/28/2012  . DVT (deep venous thrombosis) 03/19/2012  . Obstructive sleep apnea 08/16/2011  . Type II diabetes mellitus with manifestations 08/06/2011  . PATENT FORAMEN OVALE 03/13/2010  . History of CVA (cerebrovascular accident) 02/08/2010  . CAD, NATIVE VESSEL 01/26/2010  . Hyperlipidemia with target LDL less than 100 01/23/2010  . GERD 01/23/2010    Quay Burow 02/03/2015, 4:08 PM  [facility] [address] [place], [place], [address] Phone: [phone]   Fax:  [phone]

## 2015-02-03 NOTE — Assessment & Plan Note (Signed)
stable overall by history and exam, and pt to continue medical treatment as before,  to f/u any worsening symptoms or concerns 

## 2015-02-06 ENCOUNTER — Encounter: Payer: Self-pay | Admitting: Internal Medicine

## 2015-02-08 ENCOUNTER — Ambulatory Visit: Payer: BC Managed Care – PPO | Admitting: Occupational Therapy

## 2015-02-08 MED ORDER — LISINOPRIL 10 MG PO TABS: 10.0000 mg | ORAL_TABLET | Freq: Every day | ORAL | Status: DC

## 2015-02-09 ENCOUNTER — Ambulatory Visit: Payer: BC Managed Care – PPO | Attending: Family Medicine

## 2015-02-09 DIAGNOSIS — R5381 Other malaise: Secondary | ICD-10-CM | POA: Insufficient documentation

## 2015-02-09 DIAGNOSIS — R269 Unspecified abnormalities of gait and mobility: Secondary | ICD-10-CM | POA: Insufficient documentation

## 2015-02-09 DIAGNOSIS — R29898 Other symptoms and signs involving the musculoskeletal system: Secondary | ICD-10-CM | POA: Diagnosis not present

## 2015-02-09 DIAGNOSIS — R41841 Cognitive communication deficit: Secondary | ICD-10-CM | POA: Diagnosis not present

## 2015-02-09 NOTE — Therapy (Signed)
[facility] [address] [place], [place], [address] Phone: [phone]   Fax:  [phone]  Speech Language Pathology Treatment  Patient Details  Name: Tony Davis MRN: [id number] Date of Birth: 1961/01/20 Referring Provider:  Janith Lima, MD  Encounter Date: 02/09/2015      End of Session - 02/09/15 1142    Visit Number 2   Number of Visits 16   Date for SLP Re-Evaluation 04/02/15   SLP Start Time 1107   SLP Stop Time  1145   SLP Time Calculation (min) 38 min   Activity Tolerance --  limited by decr'd attention skills      Past Medical History  Diagnosis Date  . GERD (gastroesophageal reflux disease)   . HLD (hyperlipidemia)   . HTN (hypertension)   . Hematuria 8/12  . PFO (patent foramen ovale)     a. echo 8/12: EF 55-65%, grade 2 diast dysfnx; +PFO on bubble study. b. TEE 4/13: EF normal, atrial septum with suspicion for interatrial septum fenestrations without flow across and few large bubbles noted in LA.  This was not felt to require closure   . CAD (coronary artery disease)     a. s/p promus DES circumflex artery 10/13/10. b. Cath 04/2013: stable disease but possible small vessel disease -Imdur added.  Marland Kitchen DVT (deep venous thrombosis) 2013    a. 2013. b. 2015 - reported BLE DVT s/p IVC filter.   Marland Kitchen TIA (transient ischemic attack)     "1-2; both after the one in 02/2010" (09/25/2013)  . Type II diabetes mellitus   . Arthritis     "knees" (09/25/2013)  . Rheumatoid arthritis(714.0)   . Folate deficiency anemia   . Carotid artery disease     a. 1-29% BICA by study 04/2014.  Marland Kitchen BPH (benign prostatic hyperplasia)   . Basal ganglia hemorrhage 2015    a. 2015 - with intraventricular extension and hydrocephalus requiring right crani and VP shunt, complicated by encephalopathy, protein calorie malnutrition, acute resp failure req trache and PEG, BLE DVT s/p IVC, enterobacter PNA, subsequent right  hemiparesis and difficulty communicating.  Marland Kitchen Hydrocephalus 2015    a. 2/2 BG hemorrhage.  . Acute respiratory failure 2015    a. Following BG hemorrhage - s/p trache with decannulation.  . Enterobacter cloacae pneumonia 2015  . S/P percutaneous endoscopic gastrostomy (PEG) tube placement 2015  . Protein calorie malnutrition   . LV dysfunction     a. 2011 - EF 45-50%, subsequent 55-60% in 04/2014.  Marland Kitchen Noncompliance   . Falls   . Urinary incontinence   . CVA (cerebral vascular accident) 02/2010    a. CVA 2011, prior reported TIAs. b. CNS hemorrhage 2015.  Marland Kitchen CVA (cerebral vascular accident) 06/2014    "hemorrhagic; in [place]; brain activity diminshed; ability to do anything; can't do ADLs on own"  . OSA on CPAP     "not using CPAP right now" (11/30/2014)  . Incontinence of feces     Past Surgical History  Procedure Laterality Date  . Knee arthroscopy Bilateral 2000's    2 on left and 3 on rt  . Tee without cardioversion  03/21/2012    Procedure: TRANSESOPHAGEAL ECHOCARDIOGRAM (TEE);  Surgeon: Fay Records, MD;  Location: [facility] ENDOSCOPY;  Service: Cardiovascular;  Laterality: N/A;  . Coronary angioplasty with stent placement  2012    "1" (09/24/2013)  . Left heart catheterization with coronary angiogram N/A 04/24/2013    Procedure: LEFT HEART  CATHETERIZATION WITH CORONARY ANGIOGRAM;  Surgeon: Burnell Blanks, MD;  Location: [facility] CATH LAB;  Service: Cardiovascular;  Laterality: N/A;    There were no vitals taken for this visit.  Visit Diagnosis: Cognitive communication deficit      Subjective Assessment - 02/09/15 1109    Symptoms Pt five minutes late to tx. 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 Verbal attention tasks held attention for max of 20 seconds average, simple divergent naming ("three animals") for longer than mod complex ("three things that are long and thin"). Pt req'd cues after 10 seconds to think of his other children's names. Ultimately pt could not tell SLP all of his children's names.   Assessment / Recommendations / Plan   Plan Continue with current plan of care            SLP Short Term Goals - 02/09/15 1144    SLP SHORT TERM GOAL #1   Title pt will attend to a spoken therapy task for 3 minutes   Baseline 60-90 seconds   Time 4   Period Weeks   Status On-going   SLP SHORT TERM GOAL #2   Title pt will attend to an auditory therapy task for 90 seconds   Baseline 30-45 seconds   Time 4   Period Weeks   Status On-going   SLP SHORT TERM GOAL #3   Title pt will attend to written therapy task for 60 seconds   Time 4   Period Weeks   Status On-going          SLP Long Term Goals - 02/09/15 1144    SLP LONG TERM GOAL #1   Title pt will attend to spoken therapy task for 4 minutes   Time 8   Period Weeks   Status On-going   SLP LONG TERM GOAL #2   Title pt attend to written therapy task for 2 minutes   Time 8   Period Weeks   Status On-going   SLP LONG TERM GOAL #3   Title pt to attend to auditory therapy task for 3 minutes   Time 8   Period Weeks   Status On-going          Plan - 02/09/15 1143    Clinical Impression Statement Pt presents with severe-profound cognitive communication deficits, most notably sustained attention.   Speech Therapy Frequency 2x / week   Duration --  8 weeks (possibly 4)   Treatment/Interventions Compensatory techniques;Cueing hierarchy;Functional tasks;Internal/external aids;Patient/family education;Cognitive reorganization;SLP instruction and feedback    Potential to Achieve Goals Fair   Potential Considerations Ability to learn/carryover information;Severity of impairments        Problem List Patient Active Problem List   Diagnosis Date Noted  . Depression 02/03/2015  . Anasarca 11/30/2014  . Physical deconditioning 11/30/2014  . Bilateral leg edema 11/30/2014  . Memory loss 11/30/2014  . Diastolic dysfunction with chronic heart failure 11/30/2014  . Dizziness   . UTI (lower urinary tract infection)   . Essential hypertension 11/03/2014  .  Noncompliance   . LV dysfunction   . Protein calorie malnutrition   . S/P percutaneous endoscopic gastrostomy (PEG) tube placement   . Enterobacter cloacae pneumonia   . Acute respiratory failure   . Hydrocephalus   . PEG tube malfunction 10/27/2014  . Basal ganglia hemorrhage 07/30/2014  . Medication adverse effect 04/15/2014  . Routine general medical examination at a health care facility 02/11/2014  . BPH associated with nocturia 02/11/2014  . Gynecomastia 02/11/2014  . Insomnia, persistent 03/25/2013  . History of tobacco use 03/25/2013  . Chronic rhinitis 03/25/2013  . Hemiparesis due to old cerebral infarction 03/20/2013  . Folate deficiency anemia 10/13/2012  . Long term (current) use of anticoagulants 03/28/2012  . DVT (deep venous thrombosis) 03/19/2012  . Obstructive sleep apnea 08/16/2011  . Type II diabetes mellitus with manifestations 08/06/2011  . PATENT FORAMEN OVALE 03/13/2010  . History of CVA (cerebrovascular accident) 02/08/2010  . CAD, NATIVE VESSEL 01/26/2010  . Hyperlipidemia with target LDL less than 100 01/23/2010  . GERD 01/23/2010    [facility], SLP 02/09/2015, 11:45 AM  [facility] [address] [place], [place], [address] Phone: [phone]   Fax:  [phone]

## 2015-02-11 ENCOUNTER — Ambulatory Visit: Payer: BC Managed Care – PPO

## 2015-02-11 ENCOUNTER — Emergency Department (HOSPITAL_COMMUNITY)
Admission: EM | Admit: 2015-02-11 | Discharge: 2015-02-11 | Disposition: A | Discharge status: Home / Self Care | Payer: BC Managed Care – PPO | Attending: Emergency Medicine | Admitting: Emergency Medicine

## 2015-02-11 ENCOUNTER — Encounter (HOSPITAL_COMMUNITY): Payer: Self-pay | Admitting: Emergency Medicine

## 2015-02-11 ENCOUNTER — Emergency Department (HOSPITAL_COMMUNITY): Payer: BC Managed Care – PPO

## 2015-02-11 DIAGNOSIS — Z7951 Long term (current) use of inhaled steroids: Secondary | ICD-10-CM | POA: Insufficient documentation

## 2015-02-11 DIAGNOSIS — R42 Dizziness and giddiness: Secondary | ICD-10-CM | POA: Diagnosis not present

## 2015-02-11 DIAGNOSIS — Z9861 Coronary angioplasty status: Secondary | ICD-10-CM | POA: Diagnosis not present

## 2015-02-11 DIAGNOSIS — R269 Unspecified abnormalities of gait and mobility: Secondary | ICD-10-CM

## 2015-02-11 DIAGNOSIS — Z862 Personal history of diseases of the blood and blood-forming organs and certain disorders involving the immune mechanism: Secondary | ICD-10-CM | POA: Insufficient documentation

## 2015-02-11 DIAGNOSIS — Z86718 Personal history of other venous thrombosis and embolism: Secondary | ICD-10-CM | POA: Diagnosis not present

## 2015-02-11 DIAGNOSIS — G4733 Obstructive sleep apnea (adult) (pediatric): Secondary | ICD-10-CM | POA: Diagnosis not present

## 2015-02-11 DIAGNOSIS — R5381 Other malaise: Secondary | ICD-10-CM

## 2015-02-11 DIAGNOSIS — Z9981 Dependence on supplemental oxygen: Secondary | ICD-10-CM | POA: Diagnosis not present

## 2015-02-11 DIAGNOSIS — K219 Gastro-esophageal reflux disease without esophagitis: Secondary | ICD-10-CM | POA: Insufficient documentation

## 2015-02-11 DIAGNOSIS — R29898 Other symptoms and signs involving the musculoskeletal system: Secondary | ICD-10-CM

## 2015-02-11 DIAGNOSIS — Z87448 Personal history of other diseases of urinary system: Secondary | ICD-10-CM | POA: Insufficient documentation

## 2015-02-11 DIAGNOSIS — Z87891 Personal history of nicotine dependence: Secondary | ICD-10-CM | POA: Diagnosis not present

## 2015-02-11 DIAGNOSIS — R51 Headache: Secondary | ICD-10-CM | POA: Diagnosis not present

## 2015-02-11 DIAGNOSIS — Z982 Presence of cerebrospinal fluid drainage device: Secondary | ICD-10-CM | POA: Diagnosis not present

## 2015-02-11 DIAGNOSIS — Z79899 Other long term (current) drug therapy: Secondary | ICD-10-CM | POA: Diagnosis not present

## 2015-02-11 DIAGNOSIS — E785 Hyperlipidemia, unspecified: Secondary | ICD-10-CM | POA: Diagnosis not present

## 2015-02-11 DIAGNOSIS — Z9181 History of falling: Secondary | ICD-10-CM | POA: Diagnosis not present

## 2015-02-11 DIAGNOSIS — R41841 Cognitive communication deficit: Secondary | ICD-10-CM

## 2015-02-11 DIAGNOSIS — I10 Essential (primary) hypertension: Secondary | ICD-10-CM | POA: Insufficient documentation

## 2015-02-11 DIAGNOSIS — Z9889 Other specified postprocedural states: Secondary | ICD-10-CM | POA: Diagnosis not present

## 2015-02-11 DIAGNOSIS — I2510 Atherosclerotic heart disease of native coronary artery without angina pectoris: Secondary | ICD-10-CM | POA: Diagnosis not present

## 2015-02-11 DIAGNOSIS — Q211 Atrial septal defect: Secondary | ICD-10-CM | POA: Diagnosis not present

## 2015-02-11 DIAGNOSIS — M1990 Unspecified osteoarthritis, unspecified site: Secondary | ICD-10-CM | POA: Insufficient documentation

## 2015-02-11 DIAGNOSIS — E119 Type 2 diabetes mellitus without complications: Secondary | ICD-10-CM | POA: Diagnosis not present

## 2015-02-11 DIAGNOSIS — Z8673 Personal history of transient ischemic attack (TIA), and cerebral infarction without residual deficits: Secondary | ICD-10-CM | POA: Diagnosis not present

## 2015-02-11 DIAGNOSIS — R519 Headache, unspecified: Secondary | ICD-10-CM

## 2015-02-11 DIAGNOSIS — J986 Disorders of diaphragm: Secondary | ICD-10-CM | POA: Diagnosis not present

## 2015-02-11 LAB — CBC WITH DIFFERENTIAL/PLATELET
Basophils Absolute: 0 10*3/uL (ref 0.0–0.1)
Basophils Relative: 1 % (ref 0–1)
Eosinophils Absolute: 0.3 10*3/uL (ref 0.0–0.7)
Eosinophils Relative: 6 % — ABNORMAL HIGH (ref 0–5)
HCT: 42 % (ref 39.0–52.0)
Hemoglobin: 13.3 g/dL (ref 13.0–17.0)
Lymphocytes Relative: 29 % (ref 12–46)
Lymphs Abs: 1.7 10*3/uL (ref 0.7–4.0)
MCH: 27.6 pg (ref 26.0–34.0)
MCHC: 31.7 g/dL (ref 30.0–36.0)
MCV: 87.1 fL (ref 78.0–100.0)
Monocytes Absolute: 0.4 10*3/uL (ref 0.1–1.0)
Monocytes Relative: 7 % (ref 3–12)
Neutro Abs: 3.3 10*3/uL (ref 1.7–7.7)
Neutrophils Relative %: 57 % (ref 43–77)
Platelets: 226 10*3/uL (ref 150–400)
RBC: 4.82 MIL/uL (ref 4.22–5.81)
RDW: 14.1 % (ref 11.5–15.5)
WBC: 5.7 10*3/uL (ref 4.0–10.5)

## 2015-02-11 LAB — BASIC METABOLIC PANEL
Anion gap: 10 (ref 5–15)
BUN: 9 mg/dL (ref 6–23)
CO2: 31 mmol/L (ref 19–32)
Calcium: 9.9 mg/dL (ref 8.4–10.5)
Chloride: 100 mmol/L (ref 96–112)
Creatinine, Ser: 1.12 mg/dL (ref 0.50–1.35)
GFR calc Af Amer: 86 mL/min — ABNORMAL LOW (ref >90)
GFR calc non Af Amer: 74 mL/min — ABNORMAL LOW (ref >90)
Glucose, Bld: 118 mg/dL — ABNORMAL HIGH (ref 70–99)
Potassium: 3.6 mmol/L (ref 3.5–5.1)
Sodium: 141 mmol/L (ref 135–145)

## 2015-02-11 LAB — I-STAT TROPONIN, ED: Troponin i, poc: 0 ng/mL (ref 0.00–0.08)

## 2015-02-11 NOTE — Therapy (Signed)
[facility] [address] [place], [place], [address] Phone: [phone]   Fax:  [phone]  Physical Therapy Treatment  Patient Details  Name: Tony Davis MRN: [id number] Date of Birth: Sep 17, 1961 Referring Provider:  Janith Lima, MD  Encounter Date: 02/11/2015      PT End of Session - 02/11/15 1343    Visit Number 1  Second visit not counted, as it was ceased due to high BP.   Number of Visits 17   Date for PT Re-Evaluation 04/03/15   Authorization Type BC[employer]   PT Start Time 1316   PT Stop Time 1347   PT Time Calculation (min) 31 min   Behavior During Therapy Flat affect    **This is not a visit charge.**  Past Medical History  Diagnosis Date  . GERD (gastroesophageal reflux disease)   . HLD (hyperlipidemia)   . HTN (hypertension)   . Hematuria 8/12  . PFO (patent foramen ovale)     a. echo 8/12: EF 55-65%, grade 2 diast dysfnx; +PFO on bubble study. b. TEE 4/13: EF normal, atrial septum with suspicion for interatrial septum fenestrations without flow across and few large bubbles noted in LA.  This was not felt to require closure   . CAD (coronary artery disease)     a. s/p promus DES circumflex artery 10/13/10. b. Cath 04/2013: stable disease but possible small vessel disease -Imdur added.  Marland Kitchen DVT (deep venous thrombosis) 2013    a. 2013. b. 2015 - reported BLE DVT s/p IVC filter.   Marland Kitchen TIA (transient ischemic attack)     "1-2; both after the one in 02/2010" (09/25/2013)  . Type II diabetes mellitus   . Arthritis     "knees" (09/25/2013)  . Rheumatoid arthritis(714.0)   . Folate deficiency anemia   . Carotid artery disease     a. 1-29% BICA by study 04/2014.  Marland Kitchen BPH (benign prostatic hyperplasia)   . Basal ganglia hemorrhage 2015    a. 2015 - with intraventricular extension and hydrocephalus requiring right crani and VP shunt, complicated by encephalopathy, protein calorie malnutrition, acute resp  failure req trache and PEG, BLE DVT s/p IVC, enterobacter PNA, subsequent right hemiparesis and difficulty communicating.  Marland Kitchen Hydrocephalus 2015    a. 2/2 BG hemorrhage.  . Acute respiratory failure 2015    a. Following BG hemorrhage - s/p trache with decannulation.  . Enterobacter cloacae pneumonia 2015  . S/P percutaneous endoscopic gastrostomy (PEG) tube placement 2015  . Protein calorie malnutrition   . LV dysfunction     a. 2011 - EF 45-50%, subsequent 55-60% in 04/2014.  Marland Kitchen Noncompliance   . Falls   . Urinary incontinence   . CVA (cerebral vascular accident) 02/2010    a. CVA 2011, prior reported TIAs. b. CNS hemorrhage 2015.  Marland Kitchen CVA (cerebral vascular accident) 06/2014    "hemorrhagic; in [place]; brain activity diminshed; ability to do anything; can't do ADLs on own"  . OSA on CPAP     "not using CPAP right now" (11/30/2014)  . Incontinence of feces     Past Surgical History  Procedure Laterality Date  . Knee arthroscopy Bilateral 2000's    2 on left and 3 on rt  . Tee without cardioversion  03/21/2012    Procedure: TRANSESOPHAGEAL ECHOCARDIOGRAM (TEE);  Surgeon: Fay Records, MD;  Location: [facility] ENDOSCOPY;  Service: Cardiovascular;  Laterality: N/A;  . Coronary angioplasty with stent placement  2012    "  1" (09/24/2013)  . Left heart catheterization with coronary angiogram N/A 04/24/2013    Procedure: LEFT HEART CATHETERIZATION WITH CORONARY ANGIOGRAM;  Surgeon: Burnell Blanks, MD;  Location: [facility] CATH LAB;  Service: Cardiovascular;  Laterality: N/A;    BP  Seated BP: 174/108  Standing BP: 163/106; pt reported he felt lightheaded but denied a headache. Visit Diagnosis:  Abnormality of gait  Weakness of both lower extremities  Debility      Subjective Assessment - 02/11/15 1320    Symptoms Pt reported he felt lightheaded/woozy during ambulation back to the gym. Pt reported this has been going on "for awhile" but unable to state when. Pt denied falls since last visit.     Pertinent History past CVAs, 06/2014 CVA, diabetes, memory and attention deficits, B DVTs and is s/p IVC filter placement   Patient Stated Goals "Do what I normally do", improve balance and walk better   Currently in Pain? No/denies      PT ceased therapy due to increased BP. PT left a message for pt's wife, and spoke with pt's daughter. Pt's daughter will come to pick pt up. PT advised pt's daughter to take pt to his PCP's office so they can assess his BP. PT will send this note to referring MD.                        PT Short Term Goals - 02/11/15 1345    PT SHORT TERM GOAL #1   Title Pt will perform HEP with cues from wife, in order to improve strength, balance, and endurance. Target date: 03/02/15.   Status On-going   PT SHORT TERM GOAL #2   Title Pt will ambulate 300' with LRAD, and supervision, over even/uneven terrain to improve functional mobility. Target date: 03/02/15.   Status On-going   PT SHORT TERM GOAL #3   Title Pt will improve TUG time to </=36 seconds to decrease falls risk. Target date: 03/02/15.   Status On-going   PT SHORT TERM GOAL #4   Title Perform BERG and write STG/LTGs if appropriate. Target date: 03/02/15.   Status On-going   PT SHORT TERM GOAL #5   Title Pt will perform sit<>stand transfers with LRAD and min guard to improve functional mobility. Target date: 03/02/15.   Status On-going           PT Long Term Goals - 02/11/15 1345    PT LONG TERM GOAL #1   Title Pt will be able to verbalize CVA symptoms/risk factors with cues from wife to decrease risk of CVA. Target date: 03/30/15.   Status On-going   PT LONG TERM GOAL #2   Title Pt will ambulate 500' over even/uneven terrain with LRAD at MOD I level to improve functional mobility. Target date: 03/30/15.   Status On-going   PT LONG TERM GOAL #3   Title Pt will improve gait speed with LRAD to >/=1.63ft/sec. to decrease recurrent falls risk. Target date: 03/30/15.   Status On-going   PT  LONG TERM GOAL #4   Title Pt will improve TUG time with LRAD to </=30 seconds to decrease falls risk. Target date: 03/30/15.   Status On-going   PT LONG TERM GOAL #5   Title Pt will perform all sit<>stand transfers with LRAD and supervision to improve functional mobility. Target date: 03/30/15.   Status On-going   PT LONG TERM GOAL #6   Title Pt will ascend/descend 2 steps with B handrails and  supervision to traverse steps at home safely. Target date: 03/30/15.   Status On-going               Problem List Patient Active Problem List   Diagnosis Date Noted  . Depression 02/03/2015  . Anasarca 11/30/2014  . Physical deconditioning 11/30/2014  . Bilateral leg edema 11/30/2014  . Memory loss 11/30/2014  . Diastolic dysfunction with chronic heart failure 11/30/2014  . Dizziness   . UTI (lower urinary tract infection)   . Essential hypertension 11/03/2014  . Noncompliance   . LV dysfunction   . Protein calorie malnutrition   . S/P percutaneous endoscopic gastrostomy (PEG) tube placement   . Enterobacter cloacae pneumonia   . Acute respiratory failure   . Hydrocephalus   . PEG tube malfunction 10/27/2014  . Basal ganglia hemorrhage 07/30/2014  . Medication adverse effect 04/15/2014  . Routine general medical examination at a health care facility 02/11/2014  . BPH associated with nocturia 02/11/2014  . Gynecomastia 02/11/2014  . Insomnia, persistent 03/25/2013  . History of tobacco use 03/25/2013  . Chronic rhinitis 03/25/2013  . Hemiparesis due to old cerebral infarction 03/20/2013  . Folate deficiency anemia 10/13/2012  . Long term (current) use of anticoagulants 03/28/2012  . DVT (deep venous thrombosis) 03/19/2012  . Obstructive sleep apnea 08/16/2011  . Type II diabetes mellitus with manifestations 08/06/2011  . PATENT FORAMEN OVALE 03/13/2010  . History of CVA (cerebrovascular accident) 02/08/2010  . CAD, NATIVE VESSEL 01/26/2010  . Hyperlipidemia with target LDL  less than 100 01/23/2010  . GERD 01/23/2010    Stanton Kissoon L 02/11/2015, 1:47 PM  [facility] [address] [place], [place], [address] Phone: [phone]   Fax:  [phone]    Geoffry Paradise, PT,DPT 02/11/2015 1:47 PM Phone: [phone] Fax: [phone]

## 2015-02-11 NOTE — Therapy (Signed)
[facility] [address] [place], [place], [address] Phone: [phone]   Fax:  [phone]  Patient Details  Name: Tony Davis MRN: [id number] Date of Birth: 16-Jun-1961 Referring Provider:  Janith Lima, MD  Encounter Date: 02/11/2015 Pt was released from therapy sessions for this day due to significantly elevated blood pressure. See PT note dated today for details.  [facility] 02/11/2015, 2:33 PM  [facility] [address] [place], [place], [address] Phone: [phone]   Fax:  [phone]

## 2015-02-11 NOTE — Discharge Instructions (Signed)
Dizziness   Dizziness means you feel unsteady or lightheaded. You might feel like you are going to pass out (faint).  HOME CARE   · Drink enough fluids to keep your pee (urine) clear or pale yellow.  · Take your medicines exactly as told by your doctor. If you take blood pressure medicine, always stand up slowly from the lying or sitting position. Hold on to something to steady yourself.  · If you need to stand in one place for a long time, move your legs often. Tighten and relax your leg muscles.  · Have someone stay with you until you feel okay.  · Do not drive or use heavy machinery if you feel dizzy.  · Do not drink alcohol.  GET HELP RIGHT AWAY IF:   · You feel dizzy or lightheaded and it gets worse.  · You feel sick to your stomach (nauseous), or you throw up (vomit).  · You have trouble talking or walking.  · You feel weak or have trouble using your arms, hands, or legs.  · You cannot think clearly or have trouble forming sentences.  · You have chest pain, belly (abdominal) pain, sweating, or you are short of breath.  · Your vision changes.  · You are bleeding.  · You have problems from your medicine that seem to be getting worse.  MAKE SURE YOU:   · Understand these instructions.  · Will watch your condition.  · Will get help right away if you are not doing well or get worse.  Document Released: 11/15/2011 Document Revised: 02/18/2012 Document Reviewed: 11/15/2011  ExitCare® Patient Information ©2015 [employer]. This information is not intended to replace advice given to you by your health care provider. Make sure you discuss any questions you have with your health care provider.

## 2015-02-11 NOTE — ED Notes (Signed)
IV attempt by myself unsuccessful. Phlebotomy at bedside.

## 2015-02-11 NOTE — ED Notes (Addendum)
Pt c/o feeling lightheaded and htn from therapy; pt with hx hemorrhagic stroke last July; pt family admits to not taking BP meds as prescribed

## 2015-02-12 NOTE — ED Provider Notes (Signed)
CSN: [id number]     Arrival date & time 02/11/15  1411 History   First MD Initiated Contact with Patient 02/11/15 1459     Chief Complaint  Patient presents with  . Dizziness  . Hypertension     (Consider location/radiation/quality/duration/timing/severity/associated sxs/prior Treatment) HPI   54 year old male with history of basal ganglia hemorrhage earlier this year with hydrocephalus requiring right craniotomy and VP shunt placement with hospital course complicated by encephalopathy and protein calorie malnutrition, bilateral DVT requiring IVC, acute respiratory failure requiring trach and PEG with residual right paresis and difficulty communicating. He was at therapy today when he began to feel lightheaded. Denies any pain. No syncope. No SOB. Felt fine earlier in the day. Currently feels better.    Past Medical History  Diagnosis Date  . GERD (gastroesophageal reflux disease)   . HLD (hyperlipidemia)   . HTN (hypertension)   . Hematuria 8/12  . PFO (patent foramen ovale)     a. echo 8/12: EF 55-65%, grade 2 diast dysfnx; +PFO on bubble study. b. TEE 4/13: EF normal, atrial septum with suspicion for interatrial septum fenestrations without flow across and few large bubbles noted in LA.  This was not felt to require closure   . CAD (coronary artery disease)     a. s/p promus DES circumflex artery 10/13/10. b. Cath 04/2013: stable disease but possible small vessel disease -Imdur added.  Marland Kitchen DVT (deep venous thrombosis) 2013    a. 2013. b. 2015 - reported BLE DVT s/p IVC filter.   Marland Kitchen TIA (transient ischemic attack)     "1-2; both after the one in 02/2010" (09/25/2013)  . Type II diabetes mellitus   . Arthritis     "knees" (09/25/2013)  . Rheumatoid arthritis(714.0)   . Folate deficiency anemia   . Carotid artery disease     a. 1-29% BICA by study 04/2014.  Marland Kitchen BPH (benign prostatic hyperplasia)   . Basal ganglia hemorrhage 2015    a. 2015 - with intraventricular extension and  hydrocephalus requiring right crani and VP shunt, complicated by encephalopathy, protein calorie malnutrition, acute resp failure req trache and PEG, BLE DVT s/p IVC, enterobacter PNA, subsequent right hemiparesis and difficulty communicating.  Marland Kitchen Hydrocephalus 2015    a. 2/2 BG hemorrhage.  . Acute respiratory failure 2015    a. Following BG hemorrhage - s/p trache with decannulation.  . Enterobacter cloacae pneumonia 2015  . S/P percutaneous endoscopic gastrostomy (PEG) tube placement 2015  . Protein calorie malnutrition   . LV dysfunction     a. 2011 - EF 45-50%, subsequent 55-60% in 04/2014.  Marland Kitchen Noncompliance   . Falls   . Urinary incontinence   . CVA (cerebral vascular accident) 02/2010    a. CVA 2011, prior reported TIAs. b. CNS hemorrhage 2015.  Marland Kitchen CVA (cerebral vascular accident) 06/2014    "hemorrhagic; in [place]; brain activity diminshed; ability to do anything; can't do ADLs on own"  . OSA on CPAP     "not using CPAP right now" (11/30/2014)  . Incontinence of feces    Past Surgical History  Procedure Laterality Date  . Knee arthroscopy Bilateral 2000's    2 on left and 3 on rt  . Tee without cardioversion  03/21/2012    Procedure: TRANSESOPHAGEAL ECHOCARDIOGRAM (TEE);  Surgeon: Fay Records, MD;  Location: [facility] ENDOSCOPY;  Service: Cardiovascular;  Laterality: N/A;  . Coronary angioplasty with stent placement  2012    "1" (09/24/2013)  . Left heart catheterization with  coronary angiogram N/A 04/24/2013    Procedure: LEFT HEART CATHETERIZATION WITH CORONARY ANGIOGRAM;  Surgeon: Burnell Blanks, MD;  Location: [facility] CATH LAB;  Service: Cardiovascular;  Laterality: N/A;   Family History  Problem Relation Age of Onset  . Cancer Neg Hx   . Kidney disease Neg Hx   . Diabetes Neg Hx   . Heart attack Sister   . Heart attack Father   . Stroke Neg Hx   . Hypertension Brother    History  Substance Use Topics  . Smoking status: Former Smoker -- 1.00 packs/day for 35 years     Types: Cigarettes    Quit date: 03/10/2012  . Smokeless tobacco: Never Used  . Alcohol Use: No    Review of Systems  All systems reviewed and negative, other than as noted in HPI.   Allergies  Iodine and Amlodipine  Home Medications   Prior to Admission medications   Medication Sig Start Date End Date Taking? Authorizing Provider  acetaminophen (TYLENOL) 325 MG tablet Take 1-2 tablets (325-650 mg total) by mouth every 4 (four) hours as needed for mild pain or moderate pain. 11/03/14   Dayna N Dunn, PA-C  atorvastatin (LIPITOR) 80 MG tablet Take 1 tablet (80 mg total) by mouth every evening. 11/03/14   Dayna N Dunn, PA-C  cephALEXin (KEFLEX) 250 MG/5ML suspension Take 10 mLs (500 mg total) by mouth every 8 (eight) hours. 12/02/14   Kinnie Feil, MD  chlorhexidine (PERIDEX) 0.12 % solution Use as directed 15 mLs in the mouth or throat 2 (two) times daily. 11/03/14   Dayna N Dunn, PA-C  escitalopram (LEXAPRO) 10 MG tablet Take 1 tablet (10 mg total) by mouth daily. 02/03/15 05/04/15  Biagio Borg, MD  ferrous sulfate 220 (44 FE) MG/5ML solution Give 7.9mL (350mg ) by mouth every day for anemia. 11/03/14   Dayna N Dunn, PA-C  fluticasone (FLONASE) 50 MCG/ACT nasal spray Place 2 sprays into both nostrils 2 (two) times daily.    Historical Provider, MD  furosemide (LASIX) 40 MG tablet Take 1 tablet (40 mg total) by mouth daily. 11/12/14   Burnell Blanks, MD  HYDROcodone-acetaminophen (NORCO/VICODIN) 5-325 MG per tablet Take 1 tablet by mouth every 4 (four) hours as needed. 11/16/14   Larene Pickett, PA-C  lisinopril (PRINIVIL) 10 MG tablet Take 1 tablet (10 mg total) by mouth daily. 02/08/15   Biagio Borg, MD  methylphenidate (RITALIN) 5 MG tablet Take 1 tablet (5 mg total) by mouth 2 (two) times daily. 11/03/14   Dayna N Dunn, PA-C  metoprolol succinate (TOPROL-XL) 50 MG 24 hr tablet Take 1 and a half tablets by mouth twice a day. Take with or immediately following a meal. Patient  taking differently: Take 75 mg by mouth 2 (two) times daily. Take 1 and a half tablets by mouth twice a day. Take with or immediately following a meal. 11/03/14   Dayna N Dunn, PA-C  nitroGLYCERIN (NITROSTAT) 0.4 MG SL tablet Place 1 tablet (0.4 mg total) under the tongue every 5 (five) minutes as needed for chest pain (up to 3 doses). 11/03/14   Dayna N Dunn, PA-C  pantoprazole (PROTONIX) 40 MG tablet TAKE 1 TABLET BY MOUTH TWICE DAILY PRIOR TO MEALS 12/07/14   Burnell Blanks, MD  potassium chloride SA (K-DUR,KLOR-CON) 20 MEQ tablet Take 1 tablet (20 mEq total) by mouth 3 (three) times daily. 01/11/15   Burnell Blanks, MD  saccharomyces boulardii (FLORASTOR) 250 MG capsule Take  250 mg by mouth 2 (two) times daily.    Historical Provider, MD  senna-docusate (SENOKOT-S) 8.6-50 MG per tablet Take 1 tablet by mouth 2 (two) times daily as needed (constipation). 11/03/14   Dayna N Dunn, PA-C  Water For Irrigation, Sterile (FREE WATER) SOLN Place 50 mLs into feeding tube 2 times daily at 12 noon and 4 pm. 09/06/14   Ivan Anchors Love, PA-C   BP 166/97 mmHg  Pulse 71  Temp(Src) 97.5 F (36.4 C) (Oral)  Resp 18  SpO2 98% Physical Exam  Constitutional: He appears well-developed and well-nourished. No distress.  HENT:  Head: Normocephalic and atraumatic.  Eyes: Conjunctivae are normal. Right eye exhibits no discharge. Left eye exhibits no discharge.  Neck: Neck supple.  Cardiovascular: Normal rate, regular rhythm and normal heart sounds.  Exam reveals no gallop and no friction rub.   No murmur heard. Pulmonary/Chest: Effort normal and breath sounds normal. No respiratory distress.  Abdominal: Soft. He exhibits no distension. There is no tenderness.  Musculoskeletal: He exhibits no edema or tenderness.  Neurological: He is alert.  Speech slow but answers questions appropriately. r hemiparesis.   Skin: Skin is warm and dry.  Psychiatric: He has a normal mood and affect. His behavior is  normal. Thought content normal.  Nursing note and vitals reviewed.   ED Course  Procedures (including critical care time) Labs Review Labs Reviewed  BASIC METABOLIC PANEL - Abnormal; Notable for the following:    Glucose, Bld 118 (*)    GFR calc non Af Amer 74 (*)    GFR calc Af Amer 86 (*)    All other components within normal limits  CBC WITH DIFFERENTIAL/PLATELET - Abnormal; Notable for the following:    Eosinophils Relative 6 (*)    All other components within normal limits  I-STAT TROPOININ, ED    Imaging Review Dg Chest 2 View  02/11/2015   CLINICAL DATA:  Lightheaded and hypertensive. Stroke last year. Diabetes. Coronary artery disease.  EXAM: CHEST  2 VIEW  COMPARISON:  11/30/2014  FINDINGS: Lateral view degraded by patient arm position. Right-sided VP shunt catheter. Mild right hemidiaphragm elevation. Midline trachea. mild cardiomegaly with a mildly tortuous descending thoracic aorta. No pleural effusion or pneumothorax. No congestive failure. Clear lungs.  IMPRESSION: Mild cardiomegaly, without acute disease.   Electronically Signed   By: Abigail Miyamoto M.D.   On: 02/11/2015 15:27   Ct Head Wo Contrast  02/11/2015   CLINICAL DATA:  Initial evaluation for headache  EXAM: CT HEAD WITHOUT CONTRAST  TECHNIQUE: Contiguous axial images were obtained from the base of the skull through the vertex without intravenous contrast.  COMPARISON:  11/30/2014  FINDINGS: VP shunt tube identified on the right side extending into the right ventricle which is decompressed. Appearance of the ventricles unchanged from 11/30/2014. No hydrocephalus. No evidence of intracranial hemorrhage or extra-axial fluid. Mild increased attenuation of both basal ganglia stable and likely chronic. No evidence of acute vascular territory infarct. Calvarium intact. Tiny right maxillary mucous retention cyst noted. Otherwise no significant inflammatory change in the visualized portions of the paranasal sinuses.  IMPRESSION: No  change from prior study. VP shunt tube noted with no hydrocephalus.   Electronically Signed   By: Skipper Cliche M.D.   On: 02/11/2015 16:53     EKG Interpretation   Date/Time:  Friday February 11 2015 14:29:19 EST Ventricular Rate:  75 PR Interval:  150 QRS Duration: 96 QT Interval:  402 QTC Calculation: 448 R Axis:  16 Text Interpretation:  Normal sinus rhythm ST \\T \ T wave abnormality,  consider inferior ischemia Abnormal ECG agree. inferior depression slight  increase c/w old. Confirmed by Johnney Killian, MD, Jeannie Done [id number]) on 02/11/2015  2:38:59 PM      MDM   Final diagnoses:  Dizziness  Nonintractable headache, unspecified chronicity pattern, unspecified headache type    53yM with vague complaints of not feeling like himself and lightheaded. No acute neurological complaints. W/u fairly unremarkable.      Virgel Manifold, MD 02/12/15 [phone]

## 2015-02-14 ENCOUNTER — Ambulatory Visit: Payer: Medicare Other | Admitting: Physical Therapy

## 2015-02-14 ENCOUNTER — Encounter: Payer: Medicare Other | Admitting: Occupational Therapy

## 2015-02-16 ENCOUNTER — Ambulatory Visit: Payer: Medicare Other | Admitting: Cardiovascular Disease

## 2015-02-17 ENCOUNTER — Ambulatory Visit: Payer: BC Managed Care – PPO

## 2015-02-17 ENCOUNTER — Ambulatory Visit: Payer: BC Managed Care – PPO | Admitting: Occupational Therapy

## 2015-02-17 DIAGNOSIS — I610 Nontraumatic intracerebral hemorrhage in hemisphere, subcortical: Secondary | ICD-10-CM

## 2015-02-17 NOTE — Therapy (Signed)
[facility] [address] [place], [place], [address] Phone: [phone]   Fax:  [phone]  Occupational Therapy Evaluation  Patient Details  Name: Tony Davis MRN: [id number] Date of Birth: 02/25/1961 Referring Provider:  Janith Lima, MD  Encounter Date: 02/17/2015    Past Medical History  Diagnosis Date  . GERD (gastroesophageal reflux disease)   . HLD (hyperlipidemia)   . HTN (hypertension)   . Hematuria 8/12  . PFO (patent foramen ovale)     a. echo 8/12: EF 55-65%, grade 2 diast dysfnx; +PFO on bubble study. b. TEE 4/13: EF normal, atrial septum with suspicion for interatrial septum fenestrations without flow across and few large bubbles noted in LA.  This was not felt to require closure   . CAD (coronary artery disease)     a. s/p promus DES circumflex artery 10/13/10. b. Cath 04/2013: stable disease but possible small vessel disease -Imdur added.  Marland Kitchen DVT (deep venous thrombosis) 2013    a. 2013. b. 2015 - reported BLE DVT s/p IVC filter.   Marland Kitchen TIA (transient ischemic attack)     "1-2; both after the one in 02/2010" (09/25/2013)  . Type II diabetes mellitus   . Arthritis     "knees" (09/25/2013)  . Rheumatoid arthritis(714.0)   . Folate deficiency anemia   . Carotid artery disease     a. 1-29% BICA by study 04/2014.  Marland Kitchen BPH (benign prostatic hyperplasia)   . Basal ganglia hemorrhage 2015    a. 2015 - with intraventricular extension and hydrocephalus requiring right crani and VP shunt, complicated by encephalopathy, protein calorie malnutrition, acute resp failure req trache and PEG, BLE DVT s/p IVC, enterobacter PNA, subsequent right hemiparesis and difficulty communicating.  Marland Kitchen Hydrocephalus 2015    a. 2/2 BG hemorrhage.  . Acute respiratory failure 2015    a. Following BG hemorrhage - s/p trache with decannulation.  . Enterobacter cloacae pneumonia 2015  . S/P percutaneous endoscopic gastrostomy (PEG)  tube placement 2015  . Protein calorie malnutrition   . LV dysfunction     a. 2011 - EF 45-50%, subsequent 55-60% in 04/2014.  Marland Kitchen Noncompliance   . Falls   . Urinary incontinence   . CVA (cerebral vascular accident) 02/2010    a. CVA 2011, prior reported TIAs. b. CNS hemorrhage 2015.  Marland Kitchen CVA (cerebral vascular accident) 06/2014    "hemorrhagic; in [place]; brain activity diminshed; ability to do anything; can't do ADLs on own"  . OSA on CPAP     "not using CPAP right now" (11/30/2014)  . Incontinence of feces     Past Surgical History  Procedure Laterality Date  . Knee arthroscopy Bilateral 2000's    2 on left and 3 on rt  . Tee without cardioversion  03/21/2012    Procedure: TRANSESOPHAGEAL ECHOCARDIOGRAM (TEE);  Surgeon: Fay Records, MD;  Location: [facility] ENDOSCOPY;  Service: Cardiovascular;  Laterality: N/A;  . Coronary angioplasty with stent placement  2012    "1" (09/24/2013)  . Left heart catheterization with coronary angiogram N/A 04/24/2013    Procedure: LEFT HEART CATHETERIZATION WITH CORONARY ANGIOGRAM;  Surgeon: Burnell Blanks, MD;  Location: [facility] CATH LAB;  Service: Cardiovascular;  Laterality: N/A;    There were no vitals taken for this visit.  Visit Diagnosis:  Basal ganglia hemorrhage   Pt transported to session by father in law. Pt's BP 172/108 upon arrival, then 148/100 after brief rest.  Session again terminated due to  high BP.  Wife is aware that BP is too high. Pt will be placed on hold until BP can be stabilized. Will contact MD and also encourage wife to contact MD. Center has attempted to contact wife and left message to inform her of plan (this was also explained to wife at first session that also was terminated due to high BP).  Have asked wife to return call in order to discuss hold therapy plan.                                Problem List Patient Active Problem List   Diagnosis Date Noted  . Depression 02/03/2015  . Anasarca  11/30/2014  . Physical deconditioning 11/30/2014  . Bilateral leg edema 11/30/2014  . Memory loss 11/30/2014  . Diastolic dysfunction with chronic heart failure 11/30/2014  . Dizziness   . UTI (lower urinary tract infection)   . Essential hypertension 11/03/2014  . Noncompliance   . LV dysfunction   . Protein calorie malnutrition   . S/P percutaneous endoscopic gastrostomy (PEG) tube placement   . Enterobacter cloacae pneumonia   . Acute respiratory failure   . Hydrocephalus   . PEG tube malfunction 10/27/2014  . Basal ganglia hemorrhage 07/30/2014  . Medication adverse effect 04/15/2014  . Routine general medical examination at a health care facility 02/11/2014  . BPH associated with nocturia 02/11/2014  . Gynecomastia 02/11/2014  . Insomnia, persistent 03/25/2013  . History of tobacco use 03/25/2013  . Chronic rhinitis 03/25/2013  . Hemiparesis due to old cerebral infarction 03/20/2013  . Folate deficiency anemia 10/13/2012  . Long term (current) use of anticoagulants 03/28/2012  . DVT (deep venous thrombosis) 03/19/2012  . Obstructive sleep apnea 08/16/2011  . Type II diabetes mellitus with manifestations 08/06/2011  . PATENT FORAMEN OVALE 03/13/2010  . History of CVA (cerebrovascular accident) 02/08/2010  . CAD, NATIVE VESSEL 01/26/2010  . Hyperlipidemia with target LDL less than 100 01/23/2010  . GERD 01/23/2010    Quay Burow, OTR/L 02/17/2015, 10:46 AM  [facility] [address] [place], [place], [address] Phone: [phone]   Fax:  [phone]

## 2015-02-20 ENCOUNTER — Encounter: Payer: Self-pay | Admitting: Cardiovascular Disease

## 2015-02-23 ENCOUNTER — Ambulatory Visit: Payer: Medicare Other

## 2015-02-23 ENCOUNTER — Telehealth: Payer: Self-pay | Admitting: *Deleted

## 2015-02-23 ENCOUNTER — Encounter: Payer: Self-pay | Admitting: *Deleted

## 2015-02-23 DIAGNOSIS — I10 Essential (primary) hypertension: Secondary | ICD-10-CM

## 2015-02-23 MED ORDER — LISINOPRIL 20 MG PO TABS: 20.0000 mg | ORAL_TABLET | Freq: Every day | ORAL | Status: DC

## 2015-02-23 NOTE — Telephone Encounter (Signed)
Per Dr. Julianne Handler will increase 20 mg daily. Will send into HT on [address] and schedule a BMET for Monday. Lm for pt to call. Will send message via my chart for him to come in Monday for lab work. Placed order for BMET.

## 2015-02-23 NOTE — Telephone Encounter (Signed)
-----   Message from Burnell Blanks, MD sent at 02/23/2015  3:14 PM EDT ----- Rodena Piety, Please see Mychart message. I am going to increase Mr. Roy Lisinopril to 20 mg daily. Can you send that in for him and also arrange a BMET on Monday in our office? Thanks, chris

## 2015-02-25 ENCOUNTER — Ambulatory Visit: Payer: Medicare Other

## 2015-02-28 ENCOUNTER — Other Ambulatory Visit (INDEPENDENT_AMBULATORY_CARE_PROVIDER_SITE_OTHER): Payer: BC Managed Care – PPO | Admitting: *Deleted

## 2015-02-28 DIAGNOSIS — I10 Essential (primary) hypertension: Secondary | ICD-10-CM

## 2015-03-01 ENCOUNTER — Ambulatory Visit: Payer: BC Managed Care – PPO | Admitting: Speech Pathology

## 2015-03-01 ENCOUNTER — Ambulatory Visit: Payer: Self-pay | Admitting: Internal Medicine

## 2015-03-01 ENCOUNTER — Ambulatory Visit: Payer: Medicare Other

## 2015-03-01 ENCOUNTER — Encounter: Payer: Medicare Other | Admitting: Occupational Therapy

## 2015-03-01 DIAGNOSIS — Z8673 Personal history of transient ischemic attack (TIA), and cerebral infarction without residual deficits: Secondary | ICD-10-CM

## 2015-03-01 DIAGNOSIS — I82409 Acute embolism and thrombosis of unspecified deep veins of unspecified lower extremity: Secondary | ICD-10-CM

## 2015-03-01 LAB — BASIC METABOLIC PANEL
BUN: 10 mg/dL (ref 6–23)
CALCIUM: 9.9 mg/dL (ref 8.4–10.5)
CHLORIDE: 100 meq/L (ref 96–112)
CO2: 33 mEq/L — ABNORMAL HIGH (ref 19–32)
CREATININE: 1.28 mg/dL (ref 0.40–1.50)
GFR: 76.12 mL/min (ref >60.00)
Glucose, Bld: 96 mg/dL (ref 70–99)
Potassium: 3.2 mEq/L — ABNORMAL LOW (ref 3.5–5.1)
Sodium: 141 mEq/L (ref 135–145)

## 2015-03-02 ENCOUNTER — Other Ambulatory Visit: Payer: Self-pay | Admitting: *Deleted

## 2015-03-02 ENCOUNTER — Encounter: Payer: Self-pay | Admitting: *Deleted

## 2015-03-02 MED ORDER — POTASSIUM CHLORIDE CRYS ER 20 MEQ PO TBCR: 40.0000 meq | EXTENDED_RELEASE_TABLET | Freq: Two times a day (BID) | ORAL | Status: DC

## 2015-03-08 ENCOUNTER — Encounter: Payer: Medicare Other | Admitting: Occupational Therapy

## 2015-03-08 ENCOUNTER — Encounter: Payer: Medicare Other | Admitting: Speech Pathology

## 2015-03-08 ENCOUNTER — Ambulatory Visit: Payer: Medicare Other | Admitting: Physical Therapy

## 2015-03-11 ENCOUNTER — Ambulatory Visit: Payer: Medicare Other

## 2015-03-15 ENCOUNTER — Encounter: Payer: Medicare Other | Admitting: Occupational Therapy

## 2015-03-15 ENCOUNTER — Ambulatory Visit: Payer: Medicare Other | Admitting: Physical Therapy

## 2015-03-17 ENCOUNTER — Encounter: Payer: Medicare Other | Admitting: Occupational Therapy

## 2015-03-18 ENCOUNTER — Encounter: Payer: Medicare Other | Admitting: Occupational Therapy

## 2015-03-18 ENCOUNTER — Ambulatory Visit: Payer: Medicare Other

## 2015-03-22 ENCOUNTER — Ambulatory Visit: Payer: BC Managed Care – PPO

## 2015-03-22 ENCOUNTER — Ambulatory Visit: Payer: BC Managed Care – PPO | Attending: Family Medicine

## 2015-03-22 ENCOUNTER — Ambulatory Visit: Payer: BC Managed Care – PPO | Admitting: Occupational Therapy

## 2015-03-22 DIAGNOSIS — R41841 Cognitive communication deficit: Secondary | ICD-10-CM | POA: Insufficient documentation

## 2015-03-22 DIAGNOSIS — R29898 Other symptoms and signs involving the musculoskeletal system: Secondary | ICD-10-CM | POA: Insufficient documentation

## 2015-03-22 DIAGNOSIS — R5381 Other malaise: Secondary | ICD-10-CM | POA: Insufficient documentation

## 2015-03-22 DIAGNOSIS — R269 Unspecified abnormalities of gait and mobility: Secondary | ICD-10-CM | POA: Insufficient documentation

## 2015-03-24 ENCOUNTER — Ambulatory Visit: Payer: BC Managed Care – PPO

## 2015-03-24 ENCOUNTER — Ambulatory Visit: Payer: BC Managed Care – PPO | Admitting: Occupational Therapy

## 2015-04-05 NOTE — Therapy (Signed)
[facility] [address] [place], [place], [address] Phone: [phone]   Fax:  [phone]  Patient Details  Name: Tony Davis MRN: [id number] Date of Birth: 09/15/1961 Referring Provider:  No ref. provider found  Encounter Date: 04/05/2015  PHYSICAL THERAPY DISCHARGE SUMMARY  Visits from Start of Care: 2  Current functional level related to goals / functional outcomes:     PT Long Term Goals - 02/11/15 1345    PT LONG TERM GOAL #1   Title Pt will be able to verbalize CVA symptoms/risk factors with cues from wife to decrease risk of CVA. Target date: 03/30/15.   Status On-going   PT LONG TERM GOAL #2   Title Pt will ambulate 500' over even/uneven terrain with LRAD at MOD I level to improve functional mobility. Target date: 03/30/15.   Status On-going   PT LONG TERM GOAL #3   Title Pt will improve gait speed with LRAD to >/=1.37f/sec. to decrease recurrent falls risk. Target date: 03/30/15.   Status On-going   PT LONG TERM GOAL #4   Title Pt will improve TUG time with LRAD to </=30 seconds to decrease falls risk. Target date: 03/30/15.   Status On-going   PT LONG TERM GOAL #5   Title Pt will perform all sit<>stand transfers with LRAD and supervision to improve functional mobility. Target date: 03/30/15.   Status On-going   PT LONG TERM GOAL #6   Title Pt will ascend/descend 2 steps with B handrails and supervision to traverse steps at home safely. Target date: 03/30/15.   Status On-going        Remaining deficits: Unknown, as pt never returned to PT. Pt no-showed and/or cancelled all appointments.  Pt might be more appropriate for home health PT at this time, due to difficulty getting to outpatient appointments.   Education / Equipment:  Second visit ceased due to high BP, pt's family informed.  Plan: Patient agrees to discharge.  Patient goals were not met. Patient is being discharged due to not returning  since the last visit.  ?????        Demesha Boorman L 04/05/2015, 1:12 PM  [facility] [address]Phone: [phone]  Fax:  [phone]

## 2015-04-08 ENCOUNTER — Encounter: Payer: Self-pay | Admitting: Internal Medicine

## 2015-04-12 ENCOUNTER — Ambulatory Visit: Payer: Medicare Other | Admitting: Internal Medicine

## 2015-04-12 DIAGNOSIS — Z0289 Encounter for other administrative examinations: Secondary | ICD-10-CM

## 2015-04-19 ENCOUNTER — Encounter: Payer: Self-pay | Admitting: Internal Medicine

## 2015-04-19 ENCOUNTER — Ambulatory Visit (INDEPENDENT_AMBULATORY_CARE_PROVIDER_SITE_OTHER): Payer: BC Managed Care – PPO | Admitting: Internal Medicine

## 2015-04-19 DIAGNOSIS — R21 Rash and other nonspecific skin eruption: Secondary | ICD-10-CM | POA: Diagnosis not present

## 2015-04-19 MED ORDER — KETOCONAZOLE 2 % EX CREA: 1.0000 "application " | TOPICAL_CREAM | Freq: Every day | CUTANEOUS | Status: DC

## 2015-04-19 NOTE — Progress Notes (Signed)
Pre visit review using our clinic review tool, if applicable. No additional management support is needed unless otherwise documented below in the visit note. 

## 2015-04-19 NOTE — Progress Notes (Signed)
Subjective:    Patient ID: Tony Davis, male    DOB: 1961/02/14, 54 y.o.   MRN: [id number]  HPI  Here with over 1 wk onset rash to lateral and medial aspects of feet, often wears shoes and socks all day with change at home.  No pain, fever, redness or worsening swelling, though has chronic pedal edema right > left. Pt denies chest pain, increased sob or doe, wheezing, orthopnea, PND, increased LE swelling, palpitations, dizziness or syncope.  Past Medical History  Diagnosis Date  . GERD (gastroesophageal reflux disease)   . HLD (hyperlipidemia)   . HTN (hypertension)   . Hematuria 8/12  . PFO (patent foramen ovale)     a. echo 8/12: EF 55-65%, grade 2 diast dysfnx; +PFO on bubble study. b. TEE 4/13: EF normal, atrial septum with suspicion for interatrial septum fenestrations without flow across and few large bubbles noted in LA.  This was not felt to require closure   . CAD (coronary artery disease)     a. s/p promus DES circumflex artery 10/13/10. b. Cath 04/2013: stable disease but possible small vessel disease -Imdur added.  Marland Kitchen DVT (deep venous thrombosis) 2013    a. 2013. b. 2015 - reported BLE DVT s/p IVC filter.   Marland Kitchen TIA (transient ischemic attack)     "1-2; both after the one in 02/2010" (09/25/2013)  . Type II diabetes mellitus   . Arthritis     "knees" (09/25/2013)  . Rheumatoid arthritis(714.0)   . Folate deficiency anemia   . Carotid artery disease     a. 1-29% BICA by study 04/2014.  Marland Kitchen BPH (benign prostatic hyperplasia)   . Basal ganglia hemorrhage 2015    a. 2015 - with intraventricular extension and hydrocephalus requiring right crani and VP shunt, complicated by encephalopathy, protein calorie malnutrition, acute resp failure req trache and PEG, BLE DVT s/p IVC, enterobacter PNA, subsequent right hemiparesis and difficulty communicating.  Marland Kitchen Hydrocephalus 2015    a. 2/2 BG hemorrhage.  . Acute respiratory failure 2015    a. Following BG hemorrhage - s/p trache with  decannulation.  . Enterobacter cloacae pneumonia 2015  . S/P percutaneous endoscopic gastrostomy (PEG) tube placement 2015  . Protein calorie malnutrition   . LV dysfunction     a. 2011 - EF 45-50%, subsequent 55-60% in 04/2014.  Marland Kitchen Noncompliance   . Falls   . Urinary incontinence   . CVA (cerebral vascular accident) 02/2010    a. CVA 2011, prior reported TIAs. b. CNS hemorrhage 2015.  Marland Kitchen CVA (cerebral vascular accident) 06/2014    "hemorrhagic; in [place]; brain activity diminshed; ability to do anything; can't do ADLs on own"  . OSA on CPAP     "not using CPAP right now" (11/30/2014)  . Incontinence of feces    Past Surgical History  Procedure Laterality Date  . Knee arthroscopy Bilateral 2000's    2 on left and 3 on rt  . Tee without cardioversion  03/21/2012    Procedure: TRANSESOPHAGEAL ECHOCARDIOGRAM (TEE);  Surgeon: Fay Records, MD;  Location: [facility] ENDOSCOPY;  Service: Cardiovascular;  Laterality: N/A;  . Coronary angioplasty with stent placement  2012    "1" (09/24/2013)  . Left heart catheterization with coronary angiogram N/A 04/24/2013    Procedure: LEFT HEART CATHETERIZATION WITH CORONARY ANGIOGRAM;  Surgeon: Burnell Blanks, MD;  Location: [facility] CATH LAB;  Service: Cardiovascular;  Laterality: N/A;    reports that he quit smoking about 3 years ago. His  smoking use included Cigarettes. He has a 35 pack-year smoking history. He has never used smokeless tobacco. He reports that he does not drink alcohol or use illicit drugs. family history includes Heart attack in his father and sister; Hypertension in his brother. There is no history of Cancer, Kidney disease, Diabetes, or Stroke. Allergies  Allergen Reactions  . Iodine Anaphylaxis  . Amlodipine Swelling and Other (See Comments)    Foot edema   Current Outpatient Prescriptions on File Prior to Visit  Medication Sig Dispense Refill  . acetaminophen (TYLENOL) 325 MG tablet Take 1-2 tablets (325-650 mg total) by mouth every  4 (four) hours as needed for mild pain or moderate pain.    . furosemide (LASIX) 40 MG tablet Take 1 tablet (40 mg total) by mouth daily. 30 tablet 6  . lisinopril (PRINIVIL,ZESTRIL) 20 MG tablet Take 1 tablet (20 mg total) by mouth daily. 90 tablet 3  . methylphenidate (RITALIN) 5 MG tablet Take 1 tablet (5 mg total) by mouth 2 (two) times daily.    . metoprolol succinate (TOPROL-XL) 50 MG 24 hr tablet Take 1 and a half tablets by mouth twice a day. Take with or immediately following a meal. (Patient taking differently: Take 75 mg by mouth 2 (two) times daily. Take 1 and a half tablets by mouth twice a day. Take with or immediately following a meal.) 90 tablet 6  . pantoprazole (PROTONIX) 40 MG tablet TAKE 1 TABLET BY MOUTH TWICE DAILY PRIOR TO MEALS 60 tablet 4  . potassium chloride SA (K-DUR,KLOR-CON) 20 MEQ tablet Take 2 tablets (40 mEq total) by mouth 2 (two) times daily. 120 tablet 6  . atorvastatin (LIPITOR) 80 MG tablet Take 1 tablet (80 mg total) by mouth every evening. (Patient not taking: Reported on 04/19/2015)    . cephALEXin (KEFLEX) 250 MG/5ML suspension Take 10 mLs (500 mg total) by mouth every 8 (eight) hours. (Patient not taking: Reported on 04/19/2015) 100 mL 0  . chlorhexidine (PERIDEX) 0.12 % solution Use as directed 15 mLs in the mouth or throat 2 (two) times daily. (Patient not taking: Reported on 04/19/2015)    . escitalopram (LEXAPRO) 10 MG tablet Take 1 tablet (10 mg total) by mouth daily. (Patient not taking: Reported on 04/19/2015) 90 tablet 3  . ferrous sulfate 220 (44 FE) MG/5ML solution Give 7.43mL (350mg ) by mouth every day for anemia. (Patient not taking: Reported on 04/19/2015)    . fluticasone (FLONASE) 50 MCG/ACT nasal spray Place 2 sprays into both nostrils 2 (two) times daily.    Marland Kitchen HYDROcodone-acetaminophen (NORCO/VICODIN) 5-325 MG per tablet Take 1 tablet by mouth every 4 (four) hours as needed. (Patient not taking: Reported on 04/19/2015) 15 tablet 0  . nitroGLYCERIN  (NITROSTAT) 0.4 MG SL tablet Place 1 tablet (0.4 mg total) under the tongue every 5 (five) minutes as needed for chest pain (up to 3 doses). (Patient not taking: Reported on 04/19/2015) 25 tablet 3  . saccharomyces boulardii (FLORASTOR) 250 MG capsule Take 250 mg by mouth 2 (two) times daily.    Marland Kitchen senna-docusate (SENOKOT-S) 8.6-50 MG per tablet Take 1 tablet by mouth 2 (two) times daily as needed (constipation). (Patient not taking: Reported on 04/19/2015)    . Water For Irrigation, Sterile (FREE WATER) SOLN Place 50 mLs into feeding tube 2 times daily at 12 noon and 4 pm. (Patient not taking: Reported on 04/19/2015)     No current facility-administered medications on file prior to visit.   Review of Systems  All otherwise neg per pt     Objective:   Physical Exam There were no vitals taken for this visit. VS noted,  Constitutional: Pt appears in no significant distress HENT: Head: NCAT.  Right Ear: External ear normal.  Left Ear: External ear normal.  Eyes: . Pupils are equal, round, and reactive to light. Conjunctivae and EOM are normal Neck: Normal range of motion. Neck supple.  Cardiovascular: Normal rate and regular rhythm.   Pulmonary/Chest: Effort normal and breath sounds without rales or wheezing.  Neurological: Pt is alert. Not confused , motor grossly intact Skin: Skin is warm. 1+ pedal LE edema, silvery scaly scabbed like rash to lateral, medial aspects of both feet Psychiatric: Pt behavior is normal. No agitation.     Assessment & Plan:

## 2015-04-19 NOTE — Assessment & Plan Note (Signed)
Suspect fungal rash vs other - for ketocon cream prn, keep feet warm and dry,  to f/u any worsening symptoms or concerns

## 2015-04-19 NOTE — Patient Instructions (Signed)
Please take all new medication as prescribed - the cream for the feet  Please call in 1-2 wks for dermatology referral if not improving  Please try to keep feet warm and dry  Please continue all other medications as before, and refills have been done if requested.  Please have the pharmacy call with any other refills you may need.  Please keep your appointments with your specialists as you may have planned

## 2015-05-12 ENCOUNTER — Other Ambulatory Visit: Payer: Self-pay | Admitting: Internal Medicine

## 2015-05-12 ENCOUNTER — Other Ambulatory Visit: Payer: Self-pay | Admitting: Cardiovascular Disease

## 2015-06-03 ENCOUNTER — Telehealth: Payer: Self-pay | Admitting: Cardiovascular Disease

## 2015-06-03 ENCOUNTER — Encounter: Payer: Self-pay | Admitting: Internal Medicine

## 2015-06-03 ENCOUNTER — Ambulatory Visit (INDEPENDENT_AMBULATORY_CARE_PROVIDER_SITE_OTHER): Payer: BC Managed Care – PPO | Admitting: Cardiovascular Disease

## 2015-06-03 ENCOUNTER — Encounter: Payer: Self-pay | Admitting: Cardiovascular Disease

## 2015-06-03 ENCOUNTER — Other Ambulatory Visit: Payer: Self-pay | Admitting: Cardiovascular Disease

## 2015-06-03 VITALS — BP 138/92 | HR 83 | Ht 71.0 in | Wt 275.0 lb

## 2015-06-03 DIAGNOSIS — I2510 Atherosclerotic heart disease of native coronary artery without angina pectoris: Secondary | ICD-10-CM | POA: Diagnosis not present

## 2015-06-03 DIAGNOSIS — R609 Edema, unspecified: Secondary | ICD-10-CM | POA: Diagnosis not present

## 2015-06-03 DIAGNOSIS — Z8673 Personal history of transient ischemic attack (TIA), and cerebral infarction without residual deficits: Secondary | ICD-10-CM

## 2015-06-03 DIAGNOSIS — M79676 Pain in unspecified toe(s): Secondary | ICD-10-CM

## 2015-06-03 MED ORDER — FUROSEMIDE 40 MG PO TABS: 40.0000 mg | ORAL_TABLET | Freq: Two times a day (BID) | ORAL | Status: DC

## 2015-06-03 MED ORDER — LISINOPRIL 20 MG PO TABS: 20.0000 mg | ORAL_TABLET | Freq: Every day | ORAL | Status: DC

## 2015-06-03 MED ORDER — POTASSIUM CHLORIDE 20 MEQ/15ML (10%) PO SOLN: 40.0000 meq | Freq: Two times a day (BID) | ORAL | Status: DC

## 2015-06-03 NOTE — Telephone Encounter (Signed)
New message       Pt was seen earlier.  They noticed that on their medication list, zesteril was not listed.  Just want to make sure he is to take it

## 2015-06-03 NOTE — Progress Notes (Signed)
Chief Complaint  Patient presents with  . Follow-up  . Leg Swelling    History of Present Illness: 54 y.o. male with complex PHM of CAD (DES-Cx 2011, cath 04/2013 - stable, possible small vessel dz), CVAs/TIAs, HTN, HLD, prior LV dysfunction, prior medical noncompliance, recent admission in [place] for basal ganglia hemorrhage (complicated by intraventricular extension and hydrocephalus requiring right crani and VP shunt, encephalopathy, protein calorie malnutrition, acute resp failure req trache and PEG, BLE DVT s/p IVC filter, enterobacter PNA, anemia, subsequent right hemiparesis and difficulty communicating). No anti-coagulation due to brain hemorrahge.  He was seen in our office 11/03/14 by [name], PA-C for evaluation of LE edema. Norvasc was stopped. BNP was normal. Echo 11/10/14 with normal LV size and systolic function. LE edema not felt to be cardiac related. Most of that visit was spent on medication reconciliation. Admitted to [facility] December 2015 with weakness and possible UTI. BP had been elevated this spring and Lisinopril was increased. Rehab stopped due to high BP.   He is here today for cardiac follow up. Still not functioning well. Sleeps all day. LE edema unchanged. Taking Lasix 40 mg daily. He has not been taking his Lisinopril.   Cardiac history summary: I met him in 2011. Stress test, an echo and carotid dopplers at the first visit. The carotid dopplers were normal. He came in for his stress test and complained of neurological changes. He was seen by Dr. Lia Foyer and the stress test was cancelled. He was sent over to Dr. Stones office and had MRI/MRA of the head and neck. MRA showed no occlusive disease. MRI showed non-specific white matter changes but no clear infarct, possibly related to hypertension. His echo showed septal and inferobasal hypokinesis with mild concentric hypertrophy, EF 45-50%. Left heart cath October 2011 with severe stenosis in the mid Circumflex. PCI  Circumflex with placement of Promus DES 10/13/10. Repeat echo August 2012 with PFO with positive bubble study. I arranged for him to see Dr. Burt Knack for PFO closure but he skipped his appointment. He was in [place] April 2013 and had acute onset of left arm and leg weakness. He was admitted there. CT head without acute stroke. Venous dopplers with chronic DVT right femoral vein. Carotids with <59% RICA stenosis, LICA ok. Echo with PFO. Echo 03/14/12 in [place] with normal LV size and function with PFO with positive bubble study. He was started on coumadin and Lovenox and left AMA from the hospital there. I saw him in our office 03/19/12. At that time, he continued to have left sided weakness. He had not been compliant with plans for Lovenox and coumadin. He was admitted for Heparin and coumadin. TEE was done 03/21/12: EF normal, atrial septum with suspicion for interatrial septum fenestrations without flow across and few large bubbles noted in LA. This was not felt to require closure. He has been maintained on coumadin and has been followed in our coumadin clinic. Venous dopplers 2013 with no DVT. He has been been diagnosed with OSA and has been started on CPAP. He was admitted to [facility] 04/22/13 with chest pain. Cardiac markers were negative. Cardiac cath 04/24/13 with stable CAD. Patent stent distal Circumflex, mild disease LAD, moderate disease RCA. He was admitted to [facility] 04/14/14 after syncopal event felt to be due to increased Clonidine dose/orthostatic hypotension. Clonidine was stopped. Norvasc stopped in past due to edema. Another syncopal event two weeks ago in [employer]. BP low via EMS-105/70."   Primary Care Physician:  [name]  Last Lipid Profile:Lipid Panel     Component Value Date/Time   CHOL 187 10/20/2014 1148   TRIG 269.0* 10/20/2014 1148   HDL 30.60* 10/20/2014 1148   CHOLHDL 6 10/20/2014 1148   VLDL 53.8* 10/20/2014 1148   LDLCALC 64 02/11/2014 1422     Past Medical History    Diagnosis Date  . GERD (gastroesophageal reflux disease)   . HLD (hyperlipidemia)   . HTN (hypertension)   . Hematuria 8/12  . PFO (patent foramen ovale)     a. echo 8/12: EF 55-65%, grade 2 diast dysfnx; +PFO on bubble study. b. TEE 4/13: EF normal, atrial septum with suspicion for interatrial septum fenestrations without flow across and few large bubbles noted in LA.  This was not felt to require closure   . CAD (coronary artery disease)     a. s/p promus DES circumflex artery 10/13/10. b. Cath 04/2013: stable disease but possible small vessel disease -Imdur added.  Tony Davis DVT (deep venous thrombosis) 2013    a. 2013. b. 2015 - reported BLE DVT s/p IVC filter.   Tony Davis TIA (transient ischemic attack)     "1-2; both after the one in 02/2010" (09/25/2013)  . Type II diabetes mellitus   . Arthritis     "knees" (09/25/2013)  . Rheumatoid arthritis(714.0)   . Folate deficiency anemia   . Carotid artery disease     a. 1-29% BICA by study 04/2014.  Tony Davis BPH (benign prostatic hyperplasia)   . Basal ganglia hemorrhage 2015    a. 2015 - with intraventricular extension and hydrocephalus requiring right crani and VP shunt, complicated by encephalopathy, protein calorie malnutrition, acute resp failure req trache and PEG, BLE DVT s/p IVC, enterobacter PNA, subsequent right hemiparesis and difficulty communicating.  Tony Davis Hydrocephalus 2015    a. 2/2 BG hemorrhage.  . Acute respiratory failure 2015    a. Following BG hemorrhage - s/p trache with decannulation.  . Enterobacter cloacae pneumonia 2015  . S/P percutaneous endoscopic gastrostomy (PEG) tube placement 2015  . Protein calorie malnutrition   . LV dysfunction     a. 2011 - EF 45-50%, subsequent 55-60% in 04/2014.  Tony Davis Noncompliance   . Falls   . Urinary incontinence   . CVA (cerebral vascular accident) 02/2010    a. CVA 2011, prior reported TIAs. b. CNS hemorrhage 2015.  Tony Davis CVA (cerebral vascular accident) 06/2014    "hemorrhagic; in [place]; brain  activity diminshed; ability to do anything; can't do ADLs on own"  . OSA on CPAP     "not using CPAP right now" (11/30/2014)  . Incontinence of feces     Past Surgical History  Procedure Laterality Date  . Knee arthroscopy Bilateral 2000's    2 on left and 3 on rt  . Tee without cardioversion  03/21/2012    Procedure: TRANSESOPHAGEAL ECHOCARDIOGRAM (TEE);  Surgeon: Fay Records, MD;  Location: [facility] ENDOSCOPY;  Service: Cardiovascular;  Laterality: N/A;  . Coronary angioplasty with stent placement  2012    "1" (09/24/2013)  . Left heart catheterization with coronary angiogram N/A 04/24/2013    Procedure: LEFT HEART CATHETERIZATION WITH CORONARY ANGIOGRAM;  Surgeon: Burnell Blanks, MD;  Location: [facility] CATH LAB;  Service: Cardiovascular;  Laterality: N/A;    Current Outpatient Prescriptions  Medication Sig Dispense Refill  . atorvastatin (LIPITOR) 80 MG tablet Take 1 tablet (80 mg total) by mouth every evening.    Tony Davis atorvastatin (LIPITOR) 80 MG tablet TAKE 1 TABLET BY  MOUTH EVERY NIGHT AT BEDTIME 90 tablet 0  . furosemide (LASIX) 40 MG tablet Take 1 tablet (40 mg total) by mouth 2 (two) times daily. 60 tablet 6  . lisinopril (PRINIVIL,ZESTRIL) 20 MG tablet Take 1 tablet (20 mg total) by mouth daily. 30 tablet 6  . metoprolol succinate (TOPROL-XL) 50 MG 24 hr tablet Take 1 and a half tablets by mouth twice a day. Take with or immediately following a meal. (Patient taking differently: Take 75 mg by mouth 2 (two) times daily. Take 1 and a half tablets by mouth twice a day. Take with or immediately following a meal.) 90 tablet 6  . potassium chloride 20 MEQ/15ML (10%) SOLN Take 30 mLs (40 mEq total) by mouth 2 (two) times daily. 1800 mL 3   No current facility-administered medications for this visit.    Allergies  Allergen Reactions  . Iodine Anaphylaxis  . Amlodipine Swelling and Other (See Comments)    Foot edema    History   Social History  . Marital Status: Married    Spouse  Name: N/A  . Number of Children: N/A  . Years of Education: N/A   Occupational History  . Not on file.   Social History Main Topics  . Smoking status: Former Smoker -- 1.00 packs/day for 35 years    Types: Cigarettes    Quit date: 03/10/2012  . Smokeless tobacco: Never Used  . Alcohol Use: No  . Drug Use: No  . Sexual Activity: Yes   Other Topics Concern  . Not on file   Social History Narrative   Married, 5 kids; lives in [place]. 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     Family History  Problem Relation Age of Onset  . Cancer Neg Hx   . Kidney disease Neg Hx   . Diabetes Neg Hx   . Heart attack Sister   . Heart attack Father   . Stroke Neg Hx   . Hypertension Brother     Review of Systems:  As stated in the HPI and otherwise negative.   BP 138/92 mmHg  Pulse 83  Ht 5' 11" (1.803 m)  Wt 275 lb (124.739 kg)  BMI 38.37 kg/m2  Physical Examination: General: Well developed, well nourished, NAD HEENT: OP clear, mucus membranes moist SKIN: warm, dry. No rashes. Neuro: No focal deficits Musculoskeletal: Muscle strength 5/5 all ext Psychiatric: Mood and affect normal Neck: No JVD, no carotid bruits, no thyromegaly, no lymphadenopathy. Lungs:Clear bilaterally, no wheezes, rhonci, crackles Cardiovascular: Regular rate and rhythm. No murmurs, gallops or rubs. Abdomen:Soft. Bowel sounds present. Non-tender.  Extremities: 2+ bilateral lower extremity edema.   Echo 11/10/14: Left ventricle: The cavity size was normal. There was moderate concentric hypertrophy. Systolic function was vigorous. The estimated ejection fraction was in the range of 65% to 70%. Wall motion was normal; there were no regional wall motion abnormalities. Features are consistent with a pseudonormal left ventricular filling pattern, with concomitant abnormal relaxation and increased filling pressure (grade 2 diastolic dysfunction). Doppler parameters are consistent  with elevated ventricular end-diastolic filling pressure. - Aortic valve: Trileaflet; normal thickness leaflets. There was no regurgitation. - Mitral valve: Structurally normal valve. There was no regurgitation. - Left atrium: The atrium was mildly dilated. - Right ventricle: Systolic function was normal. - Right atrium: The atrium was normal in size. - Pulmonary arteries: Systolic pressure was within the normal range. - Inferior vena cava: The vessel was normal in size. -   Pericardium, extracardiac: There was no pericardial effusion.  EKG:  EKG is not ordered today. The ekg ordered today demonstrates   Recent Labs: 10/20/2014: TSH 1.23 11/30/2014: B Natriuretic Peptide 25.8 12/01/2014: ALT 18 02/11/2015: Hemoglobin 13.3; Platelets 226 02/28/2015: BUN 10; Creatinine, Ser 1.28; Potassium 3.2*; Sodium 141   Lipid Panel    Component Value Date/Time   CHOL 187 10/20/2014 1148   TRIG 269.0* 10/20/2014 1148   HDL 30.60* 10/20/2014 1148   CHOLHDL 6 10/20/2014 1148   VLDL 53.8* 10/20/2014 1148   LDLCALC 64 02/11/2014 1422   LDLDIRECT 108.8 10/20/2014 1148     Wt Readings from Last 3 Encounters:  06/03/15 275 lb (124.739 kg)  02/03/15 287 lb 1.3 oz (130.219 kg)  11/30/14 289 lb 6.6 oz (131.276 kg)     Other studies Reviewed: Additional studies/ records that were reviewed today include: . Review of the above records demonstrates:    Assessment and Plan:   1. Lower extremity edema: He still have 2+ lower ext edema. Will increase Lasix to 40 mg BID. Continue KDur. Will need BMET in one week.  Elevate legs when sitting. He will follow up in primary care for further management of non-cardiac lower ext edema.    2. H/o recurrent DVT : First in 2013, then recent BLE DVT s/p IVC filter, not felt to be a candidate for anticoagulation due to his CNS hemorrhage  3. History of CVA: Follow up with Neurology should be planned here locally.   4. HTN: BP better controlled. He has  not been taking Lisinopril as advised. Will restart Lisinopril 20  Mg daily.   5. CAD: No recent chest pains. Continue current meds.   6. DM: His wife has very poor understanding of his diabetes management plan. He will need close f/u in primary care to discuss his regimen.   Current medicines are reviewed at length with the patient today.  The patient does not have concerns regarding medicines.  The following changes have been made:  no change  Labs/ tests ordered today include:   Orders Placed This Encounter  Procedures  . Basic Metabolic Panel (BMET)    Disposition:   FU with office APP in  in 8 weeks  Signed,  , MD 06/03/2015 5:18 PM    [facility] [address], [place], [place]  [address] Phone: [phone]; Fax: [phone]      

## 2015-06-03 NOTE — Patient Instructions (Signed)
Medication Instructions:  Your physician has recommended you make the following change in your medication:  Increase furosemide to 40 mg by mouth twice daily. Resume potassium 40 meq by mouth twice daily.  This was sent to the pharmacy as a liquid form   Labwork: Your physician recommends that you return for lab work in: 1 week. BMP   Testing/Procedures: none  Follow-Up: Your physician recommends that you schedule a follow-up appointment in: 8-10 weeks with NP or PA.    Marland Kitchen

## 2015-06-03 NOTE — Telephone Encounter (Signed)
Reviewed with Dr. Angelena Form and pt should be taking Lisinopril 20 mg daily. I spoke with pt's wife and gave her this information. Will add to med list and send prescription to [facility] [address].

## 2015-06-10 ENCOUNTER — Other Ambulatory Visit (INDEPENDENT_AMBULATORY_CARE_PROVIDER_SITE_OTHER): Payer: BC Managed Care – PPO | Admitting: *Deleted

## 2015-06-10 DIAGNOSIS — R609 Edema, unspecified: Secondary | ICD-10-CM

## 2015-06-10 DIAGNOSIS — I2510 Atherosclerotic heart disease of native coronary artery without angina pectoris: Secondary | ICD-10-CM | POA: Diagnosis not present

## 2015-06-10 LAB — BASIC METABOLIC PANEL
BUN: 13 mg/dL (ref 6–23)
CO2: 29 mEq/L (ref 19–32)
CREATININE: 1.2 mg/dL (ref 0.40–1.50)
Calcium: 9.8 mg/dL (ref 8.4–10.5)
Chloride: 101 mEq/L (ref 96–112)
GFR: 81.92 mL/min (ref >60.00)
GLUCOSE: 101 mg/dL — AB (ref 70–99)
Potassium: 2.9 mEq/L — ABNORMAL LOW (ref 3.5–5.1)
Sodium: 141 mEq/L (ref 135–145)

## 2015-06-16 ENCOUNTER — Encounter: Payer: Self-pay | Admitting: *Deleted

## 2015-06-17 ENCOUNTER — Other Ambulatory Visit (INDEPENDENT_AMBULATORY_CARE_PROVIDER_SITE_OTHER): Payer: BC Managed Care – PPO | Admitting: *Deleted

## 2015-06-17 ENCOUNTER — Other Ambulatory Visit: Payer: Self-pay | Admitting: *Deleted

## 2015-06-17 DIAGNOSIS — E876 Hypokalemia: Secondary | ICD-10-CM

## 2015-06-17 DIAGNOSIS — I10 Essential (primary) hypertension: Secondary | ICD-10-CM | POA: Diagnosis not present

## 2015-06-17 LAB — BASIC METABOLIC PANEL
BUN: 13 mg/dL (ref 6–23)
CALCIUM: 9.4 mg/dL (ref 8.4–10.5)
CHLORIDE: 100 meq/L (ref 96–112)
CO2: 31 mEq/L (ref 19–32)
Creat: 1.08 mg/dL (ref 0.50–1.35)
Glucose, Bld: 116 mg/dL — ABNORMAL HIGH (ref 70–99)
POTASSIUM: 3.1 meq/L — AB (ref 3.5–5.3)
SODIUM: 141 meq/L (ref 135–145)

## 2015-06-17 NOTE — Addendum Note (Signed)
Addended by: Eulis Foster on: 06/17/2015 04:26 PM   Modules accepted: Orders

## 2015-06-17 NOTE — Addendum Note (Signed)
Addended by: Eulis Foster on: 06/17/2015 04:25 PM   Modules accepted: Orders

## 2015-06-20 ENCOUNTER — Encounter: Payer: Self-pay | Admitting: *Deleted

## 2015-06-20 ENCOUNTER — Other Ambulatory Visit: Payer: Self-pay | Admitting: *Deleted

## 2015-06-20 DIAGNOSIS — E876 Hypokalemia: Secondary | ICD-10-CM

## 2015-06-20 MED ORDER — POTASSIUM CHLORIDE 20 MEQ/15ML (10%) PO SOLN
ORAL | Status: DC
Start: 2015-06-20 — End: 2015-07-08

## 2015-06-22 ENCOUNTER — Ambulatory Visit (INDEPENDENT_AMBULATORY_CARE_PROVIDER_SITE_OTHER): Payer: BC Managed Care – PPO | Admitting: Podiatry

## 2015-06-22 ENCOUNTER — Encounter: Payer: Self-pay | Admitting: Podiatry

## 2015-06-22 VITALS — BP 164/86 | HR 76 | Resp 12

## 2015-06-22 DIAGNOSIS — I2510 Atherosclerotic heart disease of native coronary artery without angina pectoris: Secondary | ICD-10-CM

## 2015-06-22 DIAGNOSIS — B351 Tinea unguium: Secondary | ICD-10-CM | POA: Diagnosis not present

## 2015-06-22 DIAGNOSIS — E0842 Diabetes mellitus due to underlying condition with diabetic polyneuropathy: Secondary | ICD-10-CM

## 2015-06-22 DIAGNOSIS — M79673 Pain in unspecified foot: Secondary | ICD-10-CM

## 2015-06-22 NOTE — Progress Notes (Signed)
   Subjective:    Patient ID: Tony Davis, male    DOB: 1961/10/13, 54 y.o.   MRN: [id number]  HPI  PT REQUESTING FOR TOENAILS DEBRIDEMENT. Patient's wife is present in the room today. She is requesting toenail debridement. She relates a history stroke and subsequent confusion of her husband. There is no history of recent podiatric care. He has a type II diabetic  Review of Systems  Cardiovascular: Positive for leg swelling.  Genitourinary: Positive for frequency.  Psychiatric/Behavioral: Positive for confusion.   Patient's wife denies history of ulceration, claudication or amputation    Objective:   Physical Exam  Confused patient non-orientated with his wife responding to questioning for patient  Vascular: Pitting edema +4 bilaterally DP and PT pulses 0/4 bilaterally Briefly reflex immediate bilaterally  Neurological: Patient has difficulty responding to vibratory sensation, however, appears negative Patient has difficulty responding to monofilament wire, however, 1/5 bilaterally Ankle reflex equal reactive bilaterally  Dermatological: Texture and turgor within normal limits bilaterally No open skin lesions noted bilaterally The toenails are elongated and incurvated and discolored 8 Toenails are hypertrophic and deformed  Musculoskeletal: Manual motor testing dorsi 4/5 bilaterally Plantar flexion 5/5 bilaterally       Assessment & Plan:   Assessment: Confused patient Peripheral edema making it difficult to evaluate pedal pulses Sensory and motor neuropathy may be associated with diabetes and stroke combined Mycotic toenails 6-10  Plan: I review the results of the examination with patient and patient's wife. The toenails 6-10 were debrided mechanically and electrically without any bleeding  Reappoint 3 months

## 2015-06-22 NOTE — Patient Instructions (Signed)
Diabetes and Foot Care Diabetes may cause you to have problems because of poor blood supply (circulation) to your feet and legs. This may cause the skin on your feet to become thinner, break easier, and heal more slowly. Your skin may become dry, and the skin may peel and crack. You may also have nerve damage in your legs and feet causing decreased feeling in them. You may not notice minor injuries to your feet that could lead to infections or more serious problems. Taking care of your feet is one of the most important things you can do for yourself.  HOME CARE INSTRUCTIONS  Wear shoes at all times, even in the house. Do not go barefoot. Bare feet are easily injured.  Check your feet daily for blisters, cuts, and redness. If you cannot see the bottom of your feet, use a mirror or ask someone for help.  Wash your feet with warm water (do not use hot water) and mild soap. Then pat your feet and the areas between your toes until they are completely dry. Do not soak your feet as this can dry your skin.  Apply a moisturizing lotion or petroleum jelly (that does not contain alcohol and is unscented) to the skin on your feet and to dry, brittle toenails. Do not apply lotion between your toes.  Trim your toenails straight across. Do not dig under them or around the cuticle. File the edges of your nails with an emery board or nail file.  Do not cut corns or calluses or try to remove them with medicine.  Wear clean socks or stockings every day. Make sure they are not too tight. Do not wear knee-high stockings since they may decrease blood flow to your legs.  Wear shoes that fit properly and have enough cushioning. To break in new shoes, wear them for just a few hours a day. This prevents you from injuring your feet. Always look in your shoes before you put them on to be sure there are no objects inside.  Do not cross your legs. This may decrease the blood flow to your feet.  If you find a minor scrape,  cut, or break in the skin on your feet, keep it and the skin around it clean and dry. These areas may be cleansed with mild soap and water. Do not cleanse the area with peroxide, alcohol, or iodine.  When you remove an adhesive bandage, be sure not to damage the skin around it.  If you have a wound, look at it several times a day to make sure it is healing.  Do not use heating pads or hot water bottles. They may burn your skin. If you have lost feeling in your feet or legs, you may not know it is happening until it is too late.  Make sure your health care provider performs a complete foot exam at least annually or more often if you have foot problems. Report any cuts, sores, or bruises to your health care provider immediately. SEEK MEDICAL CARE IF:   You have an injury that is not healing.  You have cuts or breaks in the skin.  You have an ingrown nail.  You notice redness on your legs or feet.  You feel burning or tingling in your legs or feet.  You have pain or cramps in your legs and feet.  Your legs or feet are numb.  Your feet always feel cold. SEEK IMMEDIATE MEDICAL CARE IF:   There is increasing redness,   swelling, or pain in or around a wound.  There is a red line that goes up your leg.  Pus is coming from a wound.  You develop a fever or as directed by your health care provider.  You notice a bad smell coming from an ulcer or wound. Document Released: 11/23/2000 Document Revised: 07/29/2013 Document Reviewed: 05/05/2013 ExitCare Patient Information 2015 [employer]. This information is not intended to replace advice given to you by your health care provider. Make sure you discuss any questions you have with your health care provider.  

## 2015-06-30 ENCOUNTER — Other Ambulatory Visit (INDEPENDENT_AMBULATORY_CARE_PROVIDER_SITE_OTHER): Payer: BC Managed Care – PPO | Admitting: *Deleted

## 2015-06-30 DIAGNOSIS — E876 Hypokalemia: Secondary | ICD-10-CM | POA: Diagnosis not present

## 2015-06-30 LAB — BASIC METABOLIC PANEL
BUN: 19 mg/dL (ref 6–23)
CALCIUM: 10 mg/dL (ref 8.4–10.5)
CHLORIDE: 101 meq/L (ref 96–112)
CO2: 31 mEq/L (ref 19–32)
CREATININE: 1.25 mg/dL (ref 0.40–1.50)
GFR: 78.13 mL/min (ref >60.00)
Glucose, Bld: 105 mg/dL — ABNORMAL HIGH (ref 70–99)
Potassium: 3.3 mEq/L — ABNORMAL LOW (ref 3.5–5.1)
SODIUM: 142 meq/L (ref 135–145)

## 2015-07-02 ENCOUNTER — Other Ambulatory Visit: Payer: Self-pay | Admitting: Physician Assistant

## 2015-07-05 ENCOUNTER — Other Ambulatory Visit: Payer: Self-pay

## 2015-07-05 MED ORDER — METOPROLOL SUCCINATE ER 50 MG PO TB24: ORAL_TABLET | ORAL | Status: DC

## 2015-07-06 ENCOUNTER — Other Ambulatory Visit: Payer: Self-pay | Admitting: Physician Assistant

## 2015-07-08 ENCOUNTER — Other Ambulatory Visit: Payer: Self-pay | Admitting: *Deleted

## 2015-07-08 DIAGNOSIS — E876 Hypokalemia: Secondary | ICD-10-CM

## 2015-07-08 MED ORDER — POTASSIUM CHLORIDE 20 MEQ/15ML (10%) PO SOLN
80.0000 meq | Freq: Two times a day (BID) | ORAL | Status: DC
Start: 2015-07-08 — End: 2015-07-18

## 2015-07-18 ENCOUNTER — Telehealth: Payer: Self-pay | Admitting: Cardiovascular Disease

## 2015-07-18 MED ORDER — POTASSIUM CHLORIDE 40 MEQ/15ML (20%) PO SOLN: 80.0000 meq | Freq: Two times a day (BID) | ORAL | Status: DC

## 2015-07-18 NOTE — Telephone Encounter (Signed)
That will be ok. Thanks, chris

## 2015-07-18 NOTE — Telephone Encounter (Signed)
Spoke with rep. From Pharmacy and pt is having hard time getting pt to take medication. Pharmacy wanting to know if okay to adjust Potassium to a 40% solution from the current 10% solutions as pt is now taking 80 mEq BID and with new solution will be one table spoon per dose. Please advise if okay to adjust

## 2015-07-18 NOTE — Telephone Encounter (Signed)
Pt medication adjust to the Potassium Chloride 40 mEq/15 ml (20%) solution PO BID, (30 ml by mouth BID).  Spoke with Kennyth Lose at pt pharmacy aware that order will be coming to her as MD has agreed with change.

## 2015-07-18 NOTE — Telephone Encounter (Signed)
New message      Want to know if pt can get presc for potassium----(40% instead of 20%?????)

## 2015-07-20 ENCOUNTER — Other Ambulatory Visit: Payer: BC Managed Care – PPO

## 2015-08-01 ENCOUNTER — Encounter: Payer: Self-pay | Admitting: Neurology

## 2015-08-01 ENCOUNTER — Telehealth: Payer: Self-pay | Admitting: Neurology

## 2015-08-01 ENCOUNTER — Ambulatory Visit (INDEPENDENT_AMBULATORY_CARE_PROVIDER_SITE_OTHER): Payer: BC Managed Care – PPO | Admitting: Neurology

## 2015-08-01 VITALS — BP 130/100 | HR 66 | Resp 18 | Ht 71.0 in | Wt 265.7 lb

## 2015-08-01 DIAGNOSIS — R4189 Other symptoms and signs involving cognitive functions and awareness: Secondary | ICD-10-CM

## 2015-08-01 DIAGNOSIS — F32A Depression, unspecified: Secondary | ICD-10-CM

## 2015-08-01 DIAGNOSIS — E785 Hyperlipidemia, unspecified: Secondary | ICD-10-CM

## 2015-08-01 DIAGNOSIS — F329 Major depressive disorder, single episode, unspecified: Secondary | ICD-10-CM

## 2015-08-01 DIAGNOSIS — Z8673 Personal history of transient ischemic attack (TIA), and cerebral infarction without residual deficits: Secondary | ICD-10-CM | POA: Diagnosis not present

## 2015-08-01 DIAGNOSIS — I619 Nontraumatic intracerebral hemorrhage, unspecified: Secondary | ICD-10-CM

## 2015-08-01 DIAGNOSIS — I69359 Hemiplegia and hemiparesis following cerebral infarction affecting unspecified side: Secondary | ICD-10-CM

## 2015-08-01 DIAGNOSIS — I2510 Atherosclerotic heart disease of native coronary artery without angina pectoris: Secondary | ICD-10-CM

## 2015-08-01 DIAGNOSIS — G3184 Mild cognitive impairment, so stated: Secondary | ICD-10-CM | POA: Insufficient documentation

## 2015-08-01 DIAGNOSIS — I610 Nontraumatic intracerebral hemorrhage in hemisphere, subcortical: Secondary | ICD-10-CM

## 2015-08-01 DIAGNOSIS — I10 Essential (primary) hypertension: Secondary | ICD-10-CM

## 2015-08-01 NOTE — Patient Instructions (Addendum)
Continue Lipitor for cholesterol Blood pressure control Mediterranean diet and weight loss Repeat CT of head.  Depending on size of old bleed, will consider starting aspirin 81mg  daily [facility]  08/03/15 445pm Get notes from [facility] in [place] starting the Lexapro for depression Home PT/OT Follow up in one year.

## 2015-08-01 NOTE — Telephone Encounter (Signed)
Spoke with Lattie Haw at [facility] patient has had HGB A1c on 06/30/15

## 2015-08-01 NOTE — Telephone Encounter (Signed)
Tony Davis. From [facility]/ [phone] called concerning Lab "Hgh al?"

## 2015-08-01 NOTE — Progress Notes (Addendum)
NEUROLOGY CONSULTATION NOTE  Tony Davis. MRN: [id number] DOB: Aug 05, 1961  Referring provider: Dr. Jenny Reichmann Primary care provider: Dr. [name]  Reason for consult:  History of hemorrhagic stroke  HISTORY OF PRESENT ILLNESS: Tony Davis, Tony Davis. is a 54 year old right-handed male with CAD, hypertension, hyperlipidemia, former smoker and history of CVAs and TIAs who presents for hemorrhagic stroke.  He is accompanied by his wife, who provides some history.  MRI and MRA images from 2011, 2012 and 2013 reviewed.  Labs reviewed.  As per PM&R note, on 06/13/14, he was admitted to an outside hospital in [facility] [place] for left basal ganglia hemorrhage with intraventricular extension and hydrocephalus, which required a right craniotomy and VP shunt.  During his hospitalization, he required a tracheostomy and PEG following Enterobacter PNA and acute respiratory failure.  He was also diagnosed with bilateral DVTs, and therefore underwent IVC placement.  I do not have the actual hospital records or imaging reports available.  As per prior neurologists' notes, he has a history of CVAs in the past.  He had an ischemic stroke in 2011, presenting as left sided weakness and visual field loss.  Since the initial stroke, he reported reading deficits and did undergo speech therapy.  He also had recurrent transient episodes of left visual field loss and left sided weakness. continued to have transient left visual field loss.  MRA of head and neck from 08/17/11 showed no intracranial or hemodynamically significant internal carotid artery stenosis.  He was admitted to [facility] in [place], [place] for similar symptoms in April 2013.  MRI of the head showed moderate chronic small vessel disease and remote right caudate lacunar infarct but no acute infarct or hemorrhage.  MRA of the neck showed no significant stenosis.  I do not have report of the MRA of the head, but reportedly there was no significant stenosis.   2D echo did reveal a positive bubble study.  Lower extremity dopplers revealed a DVT and he was started on anticoagulation.  Back in [place], TEE was performed which confirmed PFO.  Despite having an ischemic stroke and DVT in setting of PFO, it was felt that closure was not indicated.  He was continued on Coumadin for up until the hemorrhagic stroke.  The transient episodes became more frequent in 2014.  In absence of negative brain MRIs and no focal intracranial or extracranial stenosis, it was concluded that his symptoms were psychogenic.    We was receiving PT/OT, which was discontinued due to elevated blood pressure.  Home PT was recommended.  Currently, he is able to bath himself with assistance.  He is now able to walk up and down steps with a cane.  Since the hemorrhagic stroke, he has very mild residual right sided weakness.  He exhibits reduced verbal output, short-term memory problems and he is significantly depressed.  He was prescribed Lexapro at one point, but his wife did not continue it very long.  Hgb A1c from 12/01/14 was 6.2.  LDL from 10/20/14 was 53.8.  PAST MEDICAL HISTORY: Past Medical History  Diagnosis Date  . GERD (gastroesophageal reflux disease)   . HLD (hyperlipidemia)   . HTN (hypertension)   . Hematuria 8/12  . PFO (patent foramen ovale)     a. echo 8/12: EF 55-65%, grade 2 diast dysfnx; +PFO on bubble study. b. TEE 4/13: EF normal, atrial septum with suspicion for interatrial septum fenestrations without flow across and few large bubbles noted in LA.  This was  not felt to require closure   . CAD (coronary artery disease)     a. s/p promus DES circumflex artery 10/13/10. b. Cath 04/2013: stable disease but possible small vessel disease -Imdur added.  Marland Kitchen DVT (deep venous thrombosis) 2013    a. 2013. b. 2015 - reported BLE DVT s/p IVC filter.   Marland Kitchen TIA (transient ischemic attack)     "1-2; both after the one in 02/2010" (09/25/2013)  . Type II diabetes mellitus   .  Arthritis     "knees" (09/25/2013)  . Rheumatoid arthritis(714.0)   . Folate deficiency anemia   . Carotid artery disease     a. 1-29% BICA by study 04/2014.  Marland Kitchen BPH (benign prostatic hyperplasia)   . Basal ganglia hemorrhage 2015    a. 2015 - with intraventricular extension and hydrocephalus requiring right crani and VP shunt, complicated by encephalopathy, protein calorie malnutrition, acute resp failure req trache and PEG, BLE DVT s/p IVC, enterobacter PNA, subsequent right hemiparesis and difficulty communicating.  Marland Kitchen Hydrocephalus 2015    a. 2/2 BG hemorrhage.  . Acute respiratory failure 2015    a. Following BG hemorrhage - s/p trache with decannulation.  . Enterobacter cloacae pneumonia 2015  . S/P percutaneous endoscopic gastrostomy (PEG) tube placement 2015  . Protein calorie malnutrition   . LV dysfunction     a. 2011 - EF 45-50%, subsequent 55-60% in 04/2014.  Marland Kitchen Noncompliance   . Falls   . Urinary incontinence   . CVA (cerebral vascular accident) 02/2010    a. CVA 2011, prior reported TIAs. b. CNS hemorrhage 2015.  Marland Kitchen CVA (cerebral vascular accident) 06/2014    "hemorrhagic; in [place]; brain activity diminshed; ability to do anything; can't do ADLs on own"  . OSA on CPAP     "not using CPAP right now" (11/30/2014)  . Incontinence of feces     PAST SURGICAL HISTORY: Past Surgical History  Procedure Laterality Date  . Knee arthroscopy Bilateral 2000's    2 on left and 3 on rt  . Tee without cardioversion  03/21/2012    Procedure: TRANSESOPHAGEAL ECHOCARDIOGRAM (TEE);  Surgeon: Fay Records, MD;  Location: [facility] ENDOSCOPY;  Service: Cardiovascular;  Laterality: N/A;  . Coronary angioplasty with stent placement  2012    "1" (09/24/2013)  . Left heart catheterization with coronary angiogram N/A 04/24/2013    Procedure: LEFT HEART CATHETERIZATION WITH CORONARY ANGIOGRAM;  Surgeon: Burnell Blanks, MD;  Location: [facility] CATH LAB;  Service: Cardiovascular;  Laterality: N/A;     MEDICATIONS: Current Outpatient Prescriptions on File Prior to Visit  Medication Sig Dispense Refill  . atorvastatin (LIPITOR) 80 MG tablet Take 1 tablet (80 mg total) by mouth every evening.    Marland Kitchen atorvastatin (LIPITOR) 80 MG tablet TAKE 1 TABLET BY MOUTH EVERY NIGHT AT BEDTIME 90 tablet 0  . furosemide (LASIX) 40 MG tablet Take 1 tablet (40 mg total) by mouth 2 (two) times daily. 60 tablet 6  . lisinopril (PRINIVIL,ZESTRIL) 20 MG tablet Take 1 tablet (20 mg total) by mouth daily. 30 tablet 6  . metoprolol succinate (TOPROL-XL) 50 MG 24 hr tablet Take 1 and a half tablets by mouth twice a day. Take with or immediately following a meal. 90 tablet 6  . Potassium Chloride 40 MEQ/15ML (20%) SOLN Take 80 mEq by mouth 2 (two) times daily. 473 mL 3   No current facility-administered medications on file prior to visit.    ALLERGIES: Allergies  Allergen Reactions  . Iodine  Anaphylaxis  . Amlodipine Swelling and Other (See Comments)    Foot edema    FAMILY HISTORY: Family History  Problem Relation Age of Onset  . Cancer Neg Hx   . Kidney disease Neg Hx   . Diabetes Neg Hx   . Heart attack Sister   . Heart attack Father   . Hypertension Brother   . Stroke Father   . Seizures Sister   . Stroke Sister     SOCIAL HISTORY: Social History   Social History  . Marital Status: Married    Spouse Name: N/A  . Number of Children: N/A  . Years of Education: N/A   Occupational History  . Not on file.   Social History Main Topics  . Smoking status: Former Smoker -- 1.00 packs/day for 35 years    Types: Cigarettes    Quit date: 03/10/2012  . Smokeless tobacco: Never Used  . Alcohol Use: No  . Drug Use: No  . Sexual Activity:    Partners: Female   Other Topics Concern  . Not on file   Social History Narrative   Married, 5 kids; lives in [place]. 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     REVIEW OF SYSTEMS: Constitutional: No fevers, chills, or sweats, no  generalized fatigue, change in appetite Eyes: No visual changes, double vision, eye pain Ear, nose and throat: No hearing loss, ear pain, nasal congestion, sore throat Cardiovascular: No chest pain, palpitations Respiratory:  No shortness of breath at rest or with exertion, wheezes GastrointestinaI: No nausea, vomiting, diarrhea, abdominal pain, fecal incontinence Genitourinary:  No dysuria, urinary retention or frequency Musculoskeletal:  No neck pain, back pain Integumentary: No rash, pruritus, skin lesions Neurological: as above Psychiatric: Depression Endocrine: No palpitations, fatigue, diaphoresis, mood swings, change in appetite, change in weight, increased thirst Hematologic/Lymphatic:  No anemia, purpura, petechiae. Allergic/Immunologic: no itchy/runny eyes, nasal congestion, recent allergic reactions, rashes  PHYSICAL EXAM: Filed Vitals:   08/01/15 0805  BP: 130/100  Pulse: 66  Resp: 18   General: No acute distress. Depressed.  Tearful. Head:  Normocephalic/atraumatic Eyes:  fundi unremarkable, without vessel changes, exudates, hemorrhages or papilledema. Neck: supple, no paraspinal tenderness, full range of motion Back: No paraspinal tenderness Heart: regular rate and rhythm Lungs: Clear to auscultation bilaterally. Vascular: No carotid bruits. Neurological Exam: Mental status: alert and oriented to person only, delayed recall poor, remote memory intact, fund of knowledge intact, attention and concentration somewhat compromised,  slowed processing speed, difficulty with copying a cube.  Able to draw a clock but unable to correctly draw hands to requested time.  Decreased verbal output but speech fluent and not dysarthric, he is able to name and repeat but has difficulty with complex commands.  Bradyphrenic.  Depressed mood. Cranial nerves: CN I: not tested CN II: pupils equal, round and reactive to light, visual fields intact, fundi unremarkable, without vessel changes,  exudates, hemorrhages or papilledema. CN III, IV, VI:  full range of motion, no nystagmus, no ptosis CN V: facial sensation intact CN VII: upper and lower face symmetric CN VIII: hearing intact CN IX, X: gag intact, uvula midline CN XI: sternocleidomastoid and trapezius muscles intact CN XII: tongue midline Bulk & Tone: normal, no fasciculations. Motor:  5-/5 in deltoids (more pronounced in right), otherwise 5/5. Sensation:  Pinprick and vibration sensation intact. Deep Tendon Reflexes:  2+ throughout, toes downgoing Finger to nose testing:  Without dysmetria, but slowed. Heel to shin:  Without  dysmetria but slowed Gait:  Wide-based gait with short strides.  Able to turn.  Unable to tandem walk.  Romberg with sway  IMPRESSION: History of ischemic CVAs History of hemorrhagic stroke.  Based on location, likely related to elevated blood pressure Cognitive impairment related to stroke Depression Hypertension Hyperlipidemia  PLAN: 1.  Will repeat CT of head to look at extent of residual stroke.  We may possibly be able to start ASA 81mg  daily for secondary stroke prevention.  Risks and benefits (increased risk of bleed) discussed with patient and his wife. 2.  Will try to obtain notes from [place]. 3.  Continue Lipitor (LDL at goal of less than 100) 4.  Blood pressure control.  Recheck BP with PCP. 5.  Advised to give the Lexapro a chance 6.  Weight loss and mediterranean diet 7.  Home PT/OT 8.  Follow up in one year.  Thank you for allowing me to take part in the care of this patient.  Metta Clines, DO  CC:  [name], MD  Cathlean Cower, MD

## 2015-08-02 ENCOUNTER — Telehealth: Payer: Self-pay | Admitting: *Deleted

## 2015-08-02 NOTE — Telephone Encounter (Signed)
kylee from the precert center called this patient needs a prior auth for his head CT on tomorrow Call back number [phone]

## 2015-08-03 ENCOUNTER — Telehealth: Payer: Self-pay | Admitting: Neurology

## 2015-08-03 ENCOUNTER — Ambulatory Visit (HOSPITAL_COMMUNITY)
Admission: RE | Admit: 2015-08-03 | Discharge: 2015-08-03 | Disposition: A | Discharge status: Home / Self Care | Payer: BC Managed Care – PPO | Source: Ambulatory Visit | Attending: Neurology | Admitting: Neurology

## 2015-08-03 DIAGNOSIS — G3184 Mild cognitive impairment, so stated: Secondary | ICD-10-CM | POA: Diagnosis not present

## 2015-08-03 DIAGNOSIS — E785 Hyperlipidemia, unspecified: Secondary | ICD-10-CM

## 2015-08-03 DIAGNOSIS — F329 Major depressive disorder, single episode, unspecified: Secondary | ICD-10-CM | POA: Diagnosis not present

## 2015-08-03 DIAGNOSIS — R29898 Other symptoms and signs involving the musculoskeletal system: Secondary | ICD-10-CM | POA: Diagnosis not present

## 2015-08-03 DIAGNOSIS — I69359 Hemiplegia and hemiparesis following cerebral infarction affecting unspecified side: Secondary | ICD-10-CM | POA: Insufficient documentation

## 2015-08-03 DIAGNOSIS — Z09 Encounter for follow-up examination after completed treatment for conditions other than malignant neoplasm: Secondary | ICD-10-CM | POA: Insufficient documentation

## 2015-08-03 DIAGNOSIS — I610 Nontraumatic intracerebral hemorrhage in hemisphere, subcortical: Secondary | ICD-10-CM

## 2015-08-03 DIAGNOSIS — I619 Nontraumatic intracerebral hemorrhage, unspecified: Secondary | ICD-10-CM | POA: Diagnosis not present

## 2015-08-03 DIAGNOSIS — Z8673 Personal history of transient ischemic attack (TIA), and cerebral infarction without residual deficits: Secondary | ICD-10-CM

## 2015-08-03 DIAGNOSIS — F32A Depression, unspecified: Secondary | ICD-10-CM

## 2015-08-03 DIAGNOSIS — R51 Headache: Secondary | ICD-10-CM | POA: Diagnosis not present

## 2015-08-03 NOTE — Telephone Encounter (Signed)
Left  Message for [facility]  Pre cert numbner from [facility] [place] PPO is [id number] valid from 08/03/15-09/01/15

## 2015-08-03 NOTE — Telephone Encounter (Signed)
Tony Davis called about precert info /[phone]

## 2015-08-04 ENCOUNTER — Telehealth: Payer: Self-pay | Admitting: Internal Medicine

## 2015-08-04 DIAGNOSIS — I2510 Atherosclerotic heart disease of native coronary artery without angina pectoris: Secondary | ICD-10-CM | POA: Diagnosis not present

## 2015-08-04 DIAGNOSIS — M1712 Unilateral primary osteoarthritis, left knee: Secondary | ICD-10-CM | POA: Diagnosis not present

## 2015-08-04 DIAGNOSIS — M069 Rheumatoid arthritis, unspecified: Secondary | ICD-10-CM | POA: Diagnosis not present

## 2015-08-04 DIAGNOSIS — Z9181 History of falling: Secondary | ICD-10-CM | POA: Diagnosis not present

## 2015-08-04 DIAGNOSIS — H53452 Other localized visual field defect, left eye: Secondary | ICD-10-CM | POA: Diagnosis not present

## 2015-08-04 DIAGNOSIS — I69151 Hemiplegia and hemiparesis following nontraumatic intracerebral hemorrhage affecting right dominant side: Secondary | ICD-10-CM | POA: Diagnosis not present

## 2015-08-04 DIAGNOSIS — N400 Enlarged prostate without lower urinary tract symptoms: Secondary | ICD-10-CM | POA: Diagnosis not present

## 2015-08-04 DIAGNOSIS — F329 Major depressive disorder, single episode, unspecified: Secondary | ICD-10-CM | POA: Diagnosis not present

## 2015-08-04 DIAGNOSIS — M1711 Unilateral primary osteoarthritis, right knee: Secondary | ICD-10-CM | POA: Diagnosis not present

## 2015-08-04 DIAGNOSIS — I10 Essential (primary) hypertension: Secondary | ICD-10-CM | POA: Diagnosis not present

## 2015-08-04 DIAGNOSIS — Z86718 Personal history of other venous thrombosis and embolism: Secondary | ICD-10-CM | POA: Diagnosis not present

## 2015-08-04 DIAGNOSIS — G4733 Obstructive sleep apnea (adult) (pediatric): Secondary | ICD-10-CM | POA: Diagnosis not present

## 2015-08-04 DIAGNOSIS — E119 Type 2 diabetes mellitus without complications: Secondary | ICD-10-CM | POA: Diagnosis not present

## 2015-08-04 DIAGNOSIS — I6911 Cognitive deficits following nontraumatic intracerebral hemorrhage: Secondary | ICD-10-CM | POA: Diagnosis not present

## 2015-08-04 DIAGNOSIS — I69198 Other sequelae of nontraumatic intracerebral hemorrhage: Secondary | ICD-10-CM | POA: Diagnosis not present

## 2015-08-04 NOTE — Telephone Encounter (Signed)
Was requesting to start care yesterday but was unable to get in contact with Patient.  Will try to contact patient again today

## 2015-08-06 DIAGNOSIS — I2510 Atherosclerotic heart disease of native coronary artery without angina pectoris: Secondary | ICD-10-CM | POA: Diagnosis not present

## 2015-08-06 DIAGNOSIS — I6911 Cognitive deficits following nontraumatic intracerebral hemorrhage: Secondary | ICD-10-CM | POA: Diagnosis not present

## 2015-08-06 DIAGNOSIS — I69198 Other sequelae of nontraumatic intracerebral hemorrhage: Secondary | ICD-10-CM | POA: Diagnosis not present

## 2015-08-06 DIAGNOSIS — E119 Type 2 diabetes mellitus without complications: Secondary | ICD-10-CM | POA: Diagnosis not present

## 2015-08-06 DIAGNOSIS — I69151 Hemiplegia and hemiparesis following nontraumatic intracerebral hemorrhage affecting right dominant side: Secondary | ICD-10-CM | POA: Diagnosis not present

## 2015-08-06 DIAGNOSIS — H53452 Other localized visual field defect, left eye: Secondary | ICD-10-CM | POA: Diagnosis not present

## 2015-08-08 DIAGNOSIS — I2510 Atherosclerotic heart disease of native coronary artery without angina pectoris: Secondary | ICD-10-CM | POA: Diagnosis not present

## 2015-08-08 DIAGNOSIS — I6911 Cognitive deficits following nontraumatic intracerebral hemorrhage: Secondary | ICD-10-CM | POA: Diagnosis not present

## 2015-08-08 DIAGNOSIS — H53452 Other localized visual field defect, left eye: Secondary | ICD-10-CM | POA: Diagnosis not present

## 2015-08-08 DIAGNOSIS — E119 Type 2 diabetes mellitus without complications: Secondary | ICD-10-CM | POA: Diagnosis not present

## 2015-08-08 DIAGNOSIS — I69198 Other sequelae of nontraumatic intracerebral hemorrhage: Secondary | ICD-10-CM | POA: Diagnosis not present

## 2015-08-08 DIAGNOSIS — I69151 Hemiplegia and hemiparesis following nontraumatic intracerebral hemorrhage affecting right dominant side: Secondary | ICD-10-CM | POA: Diagnosis not present

## 2015-08-09 ENCOUNTER — Encounter: Payer: Self-pay | Admitting: *Deleted

## 2015-08-09 NOTE — Progress Notes (Signed)
Cardiology Office Note   Date:  08/10/2015   ID:  Tony Banker., DOB Jul 28, 1961, MRN [id number]  PCP:  Tony Calico, MD  Cardiologist:  Dr. Lauree Davis   Electrophysiologist:  n/a  Chief Complaint  Patient presents with  . Edema  . Coronary Artery Disease     History of Present Illness: Tony Rayl. is a 54 y.o. male with a hx of CAD with DES to LCx in 2011, prior CVAs/TIAs, HTN, HL, cardiomyopathy.  Admitted in 2015 in [place] for basal ganglia hemorrhage (complicated by intraventricular extension and hydrocephalus requiring right crani and VP shunt, encephalopathy, protein calorie malnutrition, acute respiratory failure requiring trach and PEG, bilateral LE DVT status post IVC filter, Enterobacter pneumonia, anemia, subsequent right hemiparesis and difficulty communicating. He is not on anticoagulation secondary to brain hemorrhage.  Complex cardiac history: Initially seen in 2011. Carotid Dopplers were normal. Patient complained of neurologic changes prior to stress testing. Stress test was canceled and he was sent to his PCPs office. MRI/MRA of the head and neck demonstrated no occlusive disease, nonspecific white matter changes but no clear infarct, possibly related to hypertension. EF was 45-50% with septal and inferobasal HK and mild concentric LVH. LHC 10/11 demonstrated severe stenosis in the mid LCx treated with a Promus DES. Echo 8/12 with PFO and positive bubble study. He was to see Dr. Burt Davis for PFO closure but the patient skipped the appointment. He was seen in [place] in 4/13 with acute onset of left arm and leg weakness. CT was negative for acute stroke. Venous Dopplers demonstrated chronic DVT in RFV. Carotids: <44% RCA stenosis, LICA okay. Echo with PFO. Echo 4/13 in [place] with normal LV size and function with PFO and positive bubble study. He was placed on Coumadin + Lovenox but left AMA. He was seen 4/13 in our office with left-sided weakness. He  had not been compliant with Lovenox and Coumadin and was admitted for heparin + Coumadin. TEE 4/13: EF normal, atrial septum with suspicion for interatrial septal fenestrations without flow across and few large bubbles noted in LA. This was not felt to require closure. He was maintained on Coumadin. Venous Dopplers in 2013 negative for DVT. He was placed on CPAP for sleep apnea. Admitted 5/14 with chest pain. MI ruled out. Cardiac cath 5/14 with stable CAD, patent stent in LCx, mild disease in LAD, moderate disease in the RCA. Admitted 5/15 with syncope in the setting of hypotension. Clonidine was stopped. Norvasc was stopped in the past secondary to edema.  Now off anticoagulation due to hx of intercerebral hemorrhage.   Last seen by Dr. Lauree Davis 6/16.  He was noted to have significant LE edema.  Lasix was adjusted and he was asked to FU with PCP for non-cardiogenic edema.  He returns for FU.     Studies/Reports Reviewed Today:  Echo 12/15 Moderate concentric LVH, vigorous LVF, EF 65-70%, normal wall motion, grade 2 diastolic dysfunction, mild LAE, normal RV function  Carotid US 5/15 Bilateral ICA 1-39%  LHC 5/14 LM: No obstructive disease noted.  LAD:  minor luminal irregularities in the mid vessel.   LCx:   intermediate branch minor plaque; two OM branches minor plaque; stent in the distal AV groove LCx patent  RCA:   Prox minor luminal irregs, mid diffuse 30%, PDA  minor plaque, ostial PL branch 40% stenosis and distal portion small in caliber then bifurcates with 70% stenosis at the bifurcation involving both the superior and inferior  sub-branches - unchanged in appearance since last cath in 2011 - too small for PCI.  LVEF=55-60%.  Impression: 1. Stable single vessel CAD 2. Patent stent distal Circumflex 3. Normal LV systolic function.  Recommendations: Will continue medical management. Will add Imdur 30 mg po QHS for chest pain that may be from small vessel disease. Resume  coumadin tonight. He does not need to be therapeutic before discharge. He is followed in our coumadin clinic. Anticipate discharge in the am. Follow up in coumadin clinic next week on Tuesday or Wednesday.      Past Medical History  Diagnosis Date  . GERD (gastroesophageal reflux disease)   . HLD (hyperlipidemia)   . HTN (hypertension)   . Hematuria 8/12  . PFO (patent foramen ovale)     a. echo 8/12: EF 55-65%, grade 2 diast dysfnx; +PFO on bubble study. b. TEE 4/13: EF normal, atrial septum with suspicion for interatrial septum fenestrations without flow across and few large bubbles noted in LA.  This was not felt to require closure   . CAD (coronary artery disease)     a. s/p promus DES circumflex artery 10/13/10. b. Cath 04/2013: stable disease but possible small vessel disease -Imdur added.  Marland Kitchen DVT (deep venous thrombosis) 2013    a. 2013. b. 2015 - reported BLE DVT s/p IVC filter.   Marland Kitchen TIA (transient ischemic attack)     "1-2; both after the one in 02/2010" (09/25/2013)  . Type II diabetes mellitus   . Arthritis     "knees" (09/25/2013)  . Rheumatoid arthritis(714.0)   . Folate deficiency anemia   . Carotid artery disease     a. 1-29% BICA by study 04/2014.  Marland Kitchen BPH (benign prostatic hyperplasia)   . Basal ganglia hemorrhage 2015    a. 2015 - with intraventricular extension and hydrocephalus requiring right crani and VP shunt, complicated by encephalopathy, protein calorie malnutrition, acute resp failure req trache and PEG, BLE DVT s/p IVC, enterobacter PNA, subsequent right hemiparesis and difficulty communicating.  Marland Kitchen Hydrocephalus 2015    a. 2/2 BG hemorrhage.  . Acute respiratory failure 2015    a. Following BG hemorrhage - s/p trache with decannulation.  . Enterobacter cloacae pneumonia 2015  . S/P percutaneous endoscopic gastrostomy (PEG) tube placement 2015  . Protein calorie malnutrition   . LV dysfunction     a. 2011 - EF 45-50%, subsequent 55-60% in 04/2014.  Marland Kitchen  Noncompliance   . Falls   . Urinary incontinence   . CVA (cerebral vascular accident) 02/2010    a. CVA 2011, prior reported TIAs. b. CNS hemorrhage 2015.  Marland Kitchen CVA (cerebral vascular accident) 06/2014    "hemorrhagic; in [place]; brain activity diminshed; ability to do anything; can't do ADLs on own"  . OSA on CPAP     "not using CPAP right now" (11/30/2014)  . Incontinence of feces     Past Surgical History  Procedure Laterality Date  . Knee arthroscopy Bilateral 2000's    2 on left and 3 on rt  . Tee without cardioversion  03/21/2012    Procedure: TRANSESOPHAGEAL ECHOCARDIOGRAM (TEE);  Surgeon: Fay Records, MD;  Location: [facility] ENDOSCOPY;  Service: Cardiovascular;  Laterality: N/A;  . Coronary angioplasty with stent placement  2012    "1" (09/24/2013)  . Left heart catheterization with coronary angiogram N/A 04/24/2013    Procedure: LEFT HEART CATHETERIZATION WITH CORONARY ANGIOGRAM;  Surgeon: Burnell Blanks, MD;  Location: [facility] CATH LAB;  Service: Cardiovascular;  Laterality:  N/A;     Current Outpatient Prescriptions  Medication Sig Dispense Refill  . atorvastatin (LIPITOR) 80 MG tablet Take 1 tablet (80 mg total) by mouth every evening.    Marland Kitchen atorvastatin (LIPITOR) 80 MG tablet TAKE 1 TABLET BY MOUTH EVERY NIGHT AT BEDTIME 90 tablet 0  . furosemide (LASIX) 40 MG tablet Take 1 tablet (40 mg total) by mouth 2 (two) times daily. 60 tablet 6  . lisinopril (PRINIVIL,ZESTRIL) 20 MG tablet Take 1 tablet (20 mg total) by mouth daily. 30 tablet 6  . metoprolol succinate (TOPROL-XL) 50 MG 24 hr tablet Take 1 and a half tablets by mouth twice a day. Take with or immediately following a meal. 90 tablet 6  . Potassium Chloride 40 MEQ/15ML (20%) SOLN Take 80 mEq by mouth 2 (two) times daily. 473 mL 3   No current facility-administered medications for this visit.    Allergies:   Iodine and Amlodipine    Social History:  The patient  reports that he quit smoking about 3 years ago. His  smoking use included Cigarettes. He has a 35 pack-year smoking history. He has never used smokeless tobacco. He reports that he does not drink alcohol or use illicit drugs.   Family History:  The patient's family history includes Heart attack in his father and sister; Hypertension in his brother; Seizures in his sister; Stroke in his father and sister. There is no history of Cancer, Kidney disease, or Diabetes.    ROS:   Please see the history of present illness.   ROS    PHYSICAL EXAM: VS:  There were no vitals taken for this visit.    Wt Readings from Last 3 Encounters:  08/01/15 265 lb 11.2 oz (120.521 kg)  06/03/15 275 lb (124.739 kg)  02/03/15 287 lb 1.3 oz (130.219 kg)     GEN: Well nourished, well developed, in no acute distress HEENT: normal Neck: no JVD, no carotid bruits, no masses Cardiac:  Normal S1/S2, RRR; no murmur ,  no rubs or gallops, no edema  Respiratory:  clear to auscultation bilaterally, no wheezing, rhonchi or rales. GI: soft, nontender, nondistended, + BS MS: no deformity or atrophy Skin: warm and dry  Neuro:  CNs II-XII intact, Strength and sensation are intact Psych: Normal affect   EKG:  EKG is ordered today.  It demonstrates:      Recent Labs: 10/20/2014: TSH 1.23 11/30/2014: B Natriuretic Peptide 25.8 12/01/2014: ALT 18 02/11/2015: Hemoglobin 13.3; Platelets 226 06/30/2015: BUN 19; Creatinine, Ser 1.25; Potassium 3.3*; Sodium 142    Lipid Panel    Component Value Date/Time   CHOL 187 10/20/2014 1148   TRIG 269.0* 10/20/2014 1148   HDL 30.60* 10/20/2014 1148   CHOLHDL 6 10/20/2014 1148   VLDL 53.8* 10/20/2014 1148   LDLCALC 64 02/11/2014 1422   LDLDIRECT 108.8 10/20/2014 1148      ASSESSMENT AND PLAN:  Edema  DVT (deep venous thrombosis), unspecified laterality  History of CVA (cerebrovascular accident)  Essential hypertension  Hyperlipidemia  Coronary artery disease involving native coronary artery of native heart without  angina pectoris  PFO (patent foramen ovale)  Diabetes mellitus type 2, controlled  1. Lower extremity edema:    2. H/o recurrent DVT :  First in 2013, then recent BLE DVT s/p IVC filter, not felt to be a candidate for anticoagulation due to his CNS hemorrhage  3. History of CVA: Follow up with Neurology should be planned here locally.   4. HTN:  5. CAD:    6. Hyperlipidemia:    7. DM:     8.  PFO:  As noted, last TEE in 2013 without findings to suggest benefit with closure.      Medication Changes: Current medicines are reviewed at length with the patient today.  Concerns regarding medicines are as outlined above.  The following changes have been made:   Discontinued Medications   No medications on file   Modified Medications   No medications on file   New Prescriptions   No medications on file    Labs/ tests ordered today include:   No orders of the defined types were placed in this encounter.      Disposition:    FU with    Signed, Richardson Dopp, PA-C, MHS 08/10/2015 5:03 PM    [facility] [place], [place]  [address] Phone: [phone]; Fax: [phone]    This encounter was created in error - please disregard.

## 2015-08-10 ENCOUNTER — Encounter: Payer: Medicare Other | Admitting: Physician Assistant

## 2015-08-11 DIAGNOSIS — I6911 Cognitive deficits following nontraumatic intracerebral hemorrhage: Secondary | ICD-10-CM | POA: Diagnosis not present

## 2015-08-11 DIAGNOSIS — H53452 Other localized visual field defect, left eye: Secondary | ICD-10-CM | POA: Diagnosis not present

## 2015-08-11 DIAGNOSIS — I69151 Hemiplegia and hemiparesis following nontraumatic intracerebral hemorrhage affecting right dominant side: Secondary | ICD-10-CM | POA: Diagnosis not present

## 2015-08-11 DIAGNOSIS — I2510 Atherosclerotic heart disease of native coronary artery without angina pectoris: Secondary | ICD-10-CM | POA: Diagnosis not present

## 2015-08-11 DIAGNOSIS — E119 Type 2 diabetes mellitus without complications: Secondary | ICD-10-CM | POA: Diagnosis not present

## 2015-08-11 DIAGNOSIS — I69198 Other sequelae of nontraumatic intracerebral hemorrhage: Secondary | ICD-10-CM | POA: Diagnosis not present

## 2015-08-17 ENCOUNTER — Encounter: Payer: Self-pay | Admitting: Physician Assistant

## 2015-08-17 ENCOUNTER — Telehealth: Payer: Self-pay | Admitting: *Deleted

## 2015-08-17 ENCOUNTER — Telehealth: Payer: Self-pay | Admitting: Neurology

## 2015-08-17 ENCOUNTER — Other Ambulatory Visit: Payer: Self-pay | Admitting: Cardiovascular Disease

## 2015-08-17 DIAGNOSIS — E119 Type 2 diabetes mellitus without complications: Secondary | ICD-10-CM | POA: Diagnosis not present

## 2015-08-17 DIAGNOSIS — I69198 Other sequelae of nontraumatic intracerebral hemorrhage: Secondary | ICD-10-CM | POA: Diagnosis not present

## 2015-08-17 DIAGNOSIS — I69151 Hemiplegia and hemiparesis following nontraumatic intracerebral hemorrhage affecting right dominant side: Secondary | ICD-10-CM | POA: Diagnosis not present

## 2015-08-17 DIAGNOSIS — H53452 Other localized visual field defect, left eye: Secondary | ICD-10-CM | POA: Diagnosis not present

## 2015-08-17 DIAGNOSIS — I6911 Cognitive deficits following nontraumatic intracerebral hemorrhage: Secondary | ICD-10-CM | POA: Diagnosis not present

## 2015-08-17 DIAGNOSIS — I2510 Atherosclerotic heart disease of native coronary artery without angina pectoris: Secondary | ICD-10-CM | POA: Diagnosis not present

## 2015-08-17 NOTE — Telephone Encounter (Signed)
Radovan OT with [facility] called he would like to a verbal order for a social workers consult to come out and see this patient. He states the patient is in need of some other resources they can offer him if they could get this order. Call back number [phone]

## 2015-08-17 NOTE — Telephone Encounter (Signed)
Will you review and I can and give a verbal order. thanks

## 2015-08-17 NOTE — Telephone Encounter (Signed)
Tony Davis [phone] from [facility] called/ concerning approval of Social worker for pt

## 2015-08-18 DIAGNOSIS — I2510 Atherosclerotic heart disease of native coronary artery without angina pectoris: Secondary | ICD-10-CM | POA: Diagnosis not present

## 2015-08-18 DIAGNOSIS — H53452 Other localized visual field defect, left eye: Secondary | ICD-10-CM | POA: Diagnosis not present

## 2015-08-18 DIAGNOSIS — E119 Type 2 diabetes mellitus without complications: Secondary | ICD-10-CM | POA: Diagnosis not present

## 2015-08-18 DIAGNOSIS — I69198 Other sequelae of nontraumatic intracerebral hemorrhage: Secondary | ICD-10-CM | POA: Diagnosis not present

## 2015-08-18 DIAGNOSIS — I69151 Hemiplegia and hemiparesis following nontraumatic intracerebral hemorrhage affecting right dominant side: Secondary | ICD-10-CM | POA: Diagnosis not present

## 2015-08-18 DIAGNOSIS — I6911 Cognitive deficits following nontraumatic intracerebral hemorrhage: Secondary | ICD-10-CM | POA: Diagnosis not present

## 2015-08-18 NOTE — Telephone Encounter (Signed)
Yes, we can give that order.

## 2015-08-18 NOTE — Telephone Encounter (Signed)
LM for Radovan to call back so that I may give him a Teaching laboratory technician.

## 2015-08-19 NOTE — Telephone Encounter (Signed)
Spoke with Tony Davis, given verbal order. [facility]

## 2015-08-19 NOTE — Telephone Encounter (Signed)
LM again for Radovan to return my call. Thanks

## 2015-08-21 ENCOUNTER — Other Ambulatory Visit: Payer: Self-pay | Admitting: Cardiovascular Disease

## 2015-08-23 ENCOUNTER — Other Ambulatory Visit: Payer: Self-pay | Admitting: Cardiovascular Disease

## 2015-08-23 DIAGNOSIS — I69151 Hemiplegia and hemiparesis following nontraumatic intracerebral hemorrhage affecting right dominant side: Secondary | ICD-10-CM | POA: Diagnosis not present

## 2015-08-23 DIAGNOSIS — I6911 Cognitive deficits following nontraumatic intracerebral hemorrhage: Secondary | ICD-10-CM | POA: Diagnosis not present

## 2015-08-23 DIAGNOSIS — I69198 Other sequelae of nontraumatic intracerebral hemorrhage: Secondary | ICD-10-CM | POA: Diagnosis not present

## 2015-08-23 DIAGNOSIS — I2510 Atherosclerotic heart disease of native coronary artery without angina pectoris: Secondary | ICD-10-CM | POA: Diagnosis not present

## 2015-08-23 DIAGNOSIS — E119 Type 2 diabetes mellitus without complications: Secondary | ICD-10-CM | POA: Diagnosis not present

## 2015-08-23 DIAGNOSIS — H53452 Other localized visual field defect, left eye: Secondary | ICD-10-CM | POA: Diagnosis not present

## 2015-08-24 DIAGNOSIS — I2510 Atherosclerotic heart disease of native coronary artery without angina pectoris: Secondary | ICD-10-CM | POA: Diagnosis not present

## 2015-08-24 DIAGNOSIS — I69151 Hemiplegia and hemiparesis following nontraumatic intracerebral hemorrhage affecting right dominant side: Secondary | ICD-10-CM | POA: Diagnosis not present

## 2015-08-24 DIAGNOSIS — E119 Type 2 diabetes mellitus without complications: Secondary | ICD-10-CM | POA: Diagnosis not present

## 2015-08-24 DIAGNOSIS — I6911 Cognitive deficits following nontraumatic intracerebral hemorrhage: Secondary | ICD-10-CM | POA: Diagnosis not present

## 2015-08-24 DIAGNOSIS — H53452 Other localized visual field defect, left eye: Secondary | ICD-10-CM | POA: Diagnosis not present

## 2015-08-24 DIAGNOSIS — I69198 Other sequelae of nontraumatic intracerebral hemorrhage: Secondary | ICD-10-CM | POA: Diagnosis not present

## 2015-08-29 DIAGNOSIS — I2510 Atherosclerotic heart disease of native coronary artery without angina pectoris: Secondary | ICD-10-CM | POA: Diagnosis not present

## 2015-08-29 DIAGNOSIS — I6911 Cognitive deficits following nontraumatic intracerebral hemorrhage: Secondary | ICD-10-CM | POA: Diagnosis not present

## 2015-08-29 DIAGNOSIS — H53452 Other localized visual field defect, left eye: Secondary | ICD-10-CM | POA: Diagnosis not present

## 2015-08-29 DIAGNOSIS — I69151 Hemiplegia and hemiparesis following nontraumatic intracerebral hemorrhage affecting right dominant side: Secondary | ICD-10-CM | POA: Diagnosis not present

## 2015-08-29 DIAGNOSIS — I69198 Other sequelae of nontraumatic intracerebral hemorrhage: Secondary | ICD-10-CM | POA: Diagnosis not present

## 2015-08-29 DIAGNOSIS — E119 Type 2 diabetes mellitus without complications: Secondary | ICD-10-CM | POA: Diagnosis not present

## 2015-08-30 DIAGNOSIS — H53452 Other localized visual field defect, left eye: Secondary | ICD-10-CM | POA: Diagnosis not present

## 2015-08-30 DIAGNOSIS — E119 Type 2 diabetes mellitus without complications: Secondary | ICD-10-CM | POA: Diagnosis not present

## 2015-08-30 DIAGNOSIS — I69198 Other sequelae of nontraumatic intracerebral hemorrhage: Secondary | ICD-10-CM | POA: Diagnosis not present

## 2015-08-30 DIAGNOSIS — I6911 Cognitive deficits following nontraumatic intracerebral hemorrhage: Secondary | ICD-10-CM | POA: Diagnosis not present

## 2015-08-30 DIAGNOSIS — I69151 Hemiplegia and hemiparesis following nontraumatic intracerebral hemorrhage affecting right dominant side: Secondary | ICD-10-CM | POA: Diagnosis not present

## 2015-08-30 DIAGNOSIS — I2510 Atherosclerotic heart disease of native coronary artery without angina pectoris: Secondary | ICD-10-CM | POA: Diagnosis not present

## 2015-09-01 ENCOUNTER — Telehealth: Payer: Self-pay | Admitting: Internal Medicine

## 2015-09-01 DIAGNOSIS — I2510 Atherosclerotic heart disease of native coronary artery without angina pectoris: Secondary | ICD-10-CM | POA: Diagnosis not present

## 2015-09-01 DIAGNOSIS — I69198 Other sequelae of nontraumatic intracerebral hemorrhage: Secondary | ICD-10-CM | POA: Diagnosis not present

## 2015-09-01 DIAGNOSIS — I69151 Hemiplegia and hemiparesis following nontraumatic intracerebral hemorrhage affecting right dominant side: Secondary | ICD-10-CM | POA: Diagnosis not present

## 2015-09-01 DIAGNOSIS — I6911 Cognitive deficits following nontraumatic intracerebral hemorrhage: Secondary | ICD-10-CM | POA: Diagnosis not present

## 2015-09-01 DIAGNOSIS — E119 Type 2 diabetes mellitus without complications: Secondary | ICD-10-CM | POA: Diagnosis not present

## 2015-09-01 DIAGNOSIS — H53452 Other localized visual field defect, left eye: Secondary | ICD-10-CM | POA: Diagnosis not present

## 2015-09-01 NOTE — Telephone Encounter (Signed)
[facility] - Client [facility]  Patient Name: Tony Davis  DOB: Dec 26, 1960    Initial Comment Caller states the pt has an elevated BP. It was checked under both arms. Under left arm 165/120 and right arm showing 165/115. Caller is a physical therapist and stated she cannot perform any duties today due to the pt's symptoms. Needing to notify the on call and speak with a nursel. PT #: [phone]   Nurse Assessment  Nurse: Justine Null RN, Rodena Piety Date/Time Eilene Ghazi Time): 09/01/2015 6:13:43 PM  Confirm and document reason for call. If symptomatic, describe symptoms. ---Caller states the pt has an elevated BP. It was checked under both arms. Under left arm 165/120 and right arm showing 165/115. Caller is a physical therapist and stated she cannot perform any duties today due to the pt's symptoms. Needing to notify the on call and speak with a nursel. PT #: [phone] caller stated that the patient has a mild headache and has dizziness and has low energy level and has take BP meds and has not missed any medications  Has the patient traveled out of the country within the last 30 days? ---No  Does the patient require triage? ---Yes  Related visit to physician within the last 2 weeks? ---No  Does the PT have any chronic conditions? (i.e. diabetes, asthma, etc.) ---Yes  List chronic conditions. ---CVA HTN diabetes type II     Guidelines    Guideline Title Affirmed Question Affirmed Notes  Headache [1] New headache AND [2] age > 50   High Blood Pressure BP ? 180/110    Final Disposition User   See Physician within 24 Hours Rowe, RN, Rodena Piety    Referrals  REFERRED TO PCP OFFICE  REFERRED TO PCP OFFICE   Disagree/Comply: Comply    Disagree/Comply: Comply

## 2015-09-02 DIAGNOSIS — I69151 Hemiplegia and hemiparesis following nontraumatic intracerebral hemorrhage affecting right dominant side: Secondary | ICD-10-CM | POA: Diagnosis not present

## 2015-09-02 DIAGNOSIS — E119 Type 2 diabetes mellitus without complications: Secondary | ICD-10-CM | POA: Diagnosis not present

## 2015-09-02 DIAGNOSIS — H53452 Other localized visual field defect, left eye: Secondary | ICD-10-CM | POA: Diagnosis not present

## 2015-09-02 DIAGNOSIS — I69198 Other sequelae of nontraumatic intracerebral hemorrhage: Secondary | ICD-10-CM | POA: Diagnosis not present

## 2015-09-02 DIAGNOSIS — I6911 Cognitive deficits following nontraumatic intracerebral hemorrhage: Secondary | ICD-10-CM | POA: Diagnosis not present

## 2015-09-02 DIAGNOSIS — I2510 Atherosclerotic heart disease of native coronary artery without angina pectoris: Secondary | ICD-10-CM | POA: Diagnosis not present

## 2015-09-02 NOTE — Telephone Encounter (Signed)
Called pt to check in and/or get an OV scheduled. Several rings no voicemail. Will try again

## 2015-09-09 ENCOUNTER — Telehealth: Payer: Self-pay | Admitting: Internal Medicine

## 2015-09-09 ENCOUNTER — Emergency Department (HOSPITAL_COMMUNITY): Payer: BC Managed Care – PPO

## 2015-09-09 ENCOUNTER — Encounter (HOSPITAL_COMMUNITY): Payer: Self-pay | Admitting: *Deleted

## 2015-09-09 ENCOUNTER — Emergency Department (HOSPITAL_COMMUNITY)
Admission: EM | Admit: 2015-09-09 | Discharge: 2015-09-09 | Disposition: A | Discharge status: Home / Self Care | Payer: BC Managed Care – PPO | Attending: Emergency Medicine | Admitting: Emergency Medicine

## 2015-09-09 DIAGNOSIS — Z8709 Personal history of other diseases of the respiratory system: Secondary | ICD-10-CM | POA: Insufficient documentation

## 2015-09-09 DIAGNOSIS — Z87891 Personal history of nicotine dependence: Secondary | ICD-10-CM | POA: Diagnosis not present

## 2015-09-09 DIAGNOSIS — Z9981 Dependence on supplemental oxygen: Secondary | ICD-10-CM | POA: Diagnosis not present

## 2015-09-09 DIAGNOSIS — E119 Type 2 diabetes mellitus without complications: Secondary | ICD-10-CM | POA: Diagnosis not present

## 2015-09-09 DIAGNOSIS — Z8719 Personal history of other diseases of the digestive system: Secondary | ICD-10-CM | POA: Insufficient documentation

## 2015-09-09 DIAGNOSIS — Z8673 Personal history of transient ischemic attack (TIA), and cerebral infarction without residual deficits: Secondary | ICD-10-CM | POA: Insufficient documentation

## 2015-09-09 DIAGNOSIS — Z86718 Personal history of other venous thrombosis and embolism: Secondary | ICD-10-CM | POA: Diagnosis not present

## 2015-09-09 DIAGNOSIS — Z8679 Personal history of other diseases of the circulatory system: Secondary | ICD-10-CM | POA: Insufficient documentation

## 2015-09-09 DIAGNOSIS — Z9861 Coronary angioplasty status: Secondary | ICD-10-CM | POA: Diagnosis not present

## 2015-09-09 DIAGNOSIS — R531 Weakness: Secondary | ICD-10-CM | POA: Diagnosis not present

## 2015-09-09 DIAGNOSIS — E785 Hyperlipidemia, unspecified: Secondary | ICD-10-CM | POA: Diagnosis not present

## 2015-09-09 DIAGNOSIS — R05 Cough: Secondary | ICD-10-CM | POA: Diagnosis not present

## 2015-09-09 DIAGNOSIS — Z79899 Other long term (current) drug therapy: Secondary | ICD-10-CM | POA: Insufficient documentation

## 2015-09-09 DIAGNOSIS — R0602 Shortness of breath: Secondary | ICD-10-CM | POA: Diagnosis not present

## 2015-09-09 DIAGNOSIS — Z8701 Personal history of pneumonia (recurrent): Secondary | ICD-10-CM | POA: Diagnosis not present

## 2015-09-09 DIAGNOSIS — Z862 Personal history of diseases of the blood and blood-forming organs and certain disorders involving the immune mechanism: Secondary | ICD-10-CM | POA: Insufficient documentation

## 2015-09-09 DIAGNOSIS — Z87448 Personal history of other diseases of urinary system: Secondary | ICD-10-CM | POA: Insufficient documentation

## 2015-09-09 DIAGNOSIS — R410 Disorientation, unspecified: Secondary | ICD-10-CM | POA: Insufficient documentation

## 2015-09-09 DIAGNOSIS — G4733 Obstructive sleep apnea (adult) (pediatric): Secondary | ICD-10-CM | POA: Diagnosis not present

## 2015-09-09 DIAGNOSIS — I2510 Atherosclerotic heart disease of native coronary artery without angina pectoris: Secondary | ICD-10-CM | POA: Diagnosis not present

## 2015-09-09 DIAGNOSIS — I10 Essential (primary) hypertension: Secondary | ICD-10-CM | POA: Diagnosis not present

## 2015-09-09 DIAGNOSIS — R4182 Altered mental status, unspecified: Secondary | ICD-10-CM | POA: Diagnosis not present

## 2015-09-09 DIAGNOSIS — R079 Chest pain, unspecified: Secondary | ICD-10-CM | POA: Diagnosis not present

## 2015-09-09 LAB — LACTIC ACID, PLASMA: LACTIC ACID, VENOUS: 1.8 mmol/L (ref 0.5–2.0)

## 2015-09-09 LAB — CBC
HEMATOCRIT: 47.1 % (ref 39.0–52.0)
Hemoglobin: 15.6 g/dL (ref 13.0–17.0)
MCH: 30.4 pg (ref 26.0–34.0)
MCHC: 33.1 g/dL (ref 30.0–36.0)
MCV: 91.6 fL (ref 78.0–100.0)
PLATELETS: 257 10*3/uL (ref 150–400)
RBC: 5.14 MIL/uL (ref 4.22–5.81)
RDW: 13.2 % (ref 11.5–15.5)
WBC: 7.6 10*3/uL (ref 4.0–10.5)

## 2015-09-09 LAB — DIFFERENTIAL
BASOS ABS: 0 10*3/uL (ref 0.0–0.1)
BASOS PCT: 0 %
EOS ABS: 0.3 10*3/uL (ref 0.0–0.7)
Eosinophils Relative: 3 %
Lymphocytes Relative: 21 %
Lymphs Abs: 1.6 10*3/uL (ref 0.7–4.0)
MONOS PCT: 4 %
Monocytes Absolute: 0.3 10*3/uL (ref 0.1–1.0)
NEUTROS ABS: 5.5 10*3/uL (ref 1.7–7.7)
Neutrophils Relative %: 72 %

## 2015-09-09 LAB — COMPREHENSIVE METABOLIC PANEL
ALT: 22 U/L (ref 17–63)
AST: 23 U/L (ref 15–41)
Albumin: 4.5 g/dL (ref 3.5–5.0)
Alkaline Phosphatase: 143 U/L — ABNORMAL HIGH (ref 38–126)
Anion gap: 10 (ref 5–15)
BUN: 9 mg/dL (ref 6–20)
CHLORIDE: 101 mmol/L (ref 101–111)
CO2: 29 mmol/L (ref 22–32)
Calcium: 9.6 mg/dL (ref 8.9–10.3)
Creatinine, Ser: 1.26 mg/dL — ABNORMAL HIGH (ref 0.61–1.24)
Glucose, Bld: 156 mg/dL — ABNORMAL HIGH (ref 65–99)
POTASSIUM: 3.3 mmol/L — AB (ref 3.5–5.1)
SODIUM: 140 mmol/L (ref 135–145)
Total Bilirubin: 1.5 mg/dL — ABNORMAL HIGH (ref 0.3–1.2)
Total Protein: 7.9 g/dL (ref 6.5–8.1)

## 2015-09-09 LAB — I-STAT CHEM 8, ED
BUN: 11 mg/dL (ref 6–20)
CHLORIDE: 98 mmol/L — AB (ref 101–111)
Calcium, Ion: 1.17 mmol/L (ref 1.12–1.23)
Creatinine, Ser: 1.3 mg/dL — ABNORMAL HIGH (ref 0.61–1.24)
Glucose, Bld: 153 mg/dL — ABNORMAL HIGH (ref 65–99)
HEMATOCRIT: 51 % (ref 39.0–52.0)
HEMOGLOBIN: 17.3 g/dL — AB (ref 13.0–17.0)
POTASSIUM: 3.2 mmol/L — AB (ref 3.5–5.1)
Sodium: 143 mmol/L (ref 135–145)
TCO2: 28 mmol/L (ref 0–100)

## 2015-09-09 LAB — APTT: APTT: 33 s (ref 24–37)

## 2015-09-09 LAB — URINALYSIS, ROUTINE W REFLEX MICROSCOPIC
Glucose, UA: NEGATIVE mg/dL
KETONES UR: 15 mg/dL — AB
NITRITE: NEGATIVE
PROTEIN: 100 mg/dL — AB
Specific Gravity, Urine: 1.021 (ref 1.005–1.030)
Urobilinogen, UA: 1 mg/dL (ref 0.0–1.0)
pH: 6 (ref 5.0–8.0)

## 2015-09-09 LAB — PROTIME-INR
INR: 1.14 (ref 0.00–1.49)
PROTHROMBIN TIME: 14.8 s (ref 11.6–15.2)

## 2015-09-09 LAB — URINE MICROSCOPIC-ADD ON

## 2015-09-09 LAB — CBG MONITORING, ED: GLUCOSE-CAPILLARY: 111 mg/dL — AB (ref 65–99)

## 2015-09-09 LAB — I-STAT TROPONIN, ED: TROPONIN I, POC: 0 ng/mL (ref 0.00–0.08)

## 2015-09-09 NOTE — Telephone Encounter (Signed)
[facility] - Client [facility]  Patient Name: Tony Davis  DOB: 03-Nov-1961    Initial Comment Caller says her husband had a stroke over a year ago. For the past 4, 5 days he is hardly eating and is not himself. His BP was hi a couple of weeks ago.    Nurse Assessment  Nurse: Germain Osgood, RN, Opal Sidles Date/Time Eilene Ghazi Time): 09/09/2015 4:18:37 PM  Confirm and document reason for call. If symptomatic, describe symptoms. ---Caller states patient has had a change in behavior over last 4-5 days. Not eating or drinking well. BP 164/98 Sits in chair and then goes to bed as only activities. Is on Lexapro since August. No other symptoms. BP med: Zestril, Toprolol, Lasix 80 mg daily ordered BID. Has reduced to daily since swelling down ( spouse ). Has not had Lab work done recently  Has the patient traveled out of the country within the last 30 days? ---No  Does the patient require triage? ---Yes  Related visit to physician within the last 2 weeks? ---No  Does the PT have any chronic conditions? (i.e. diabetes, asthma, etc.) ---Yes  List chronic conditions. ---Hypertension, fluid retention, stint, Shield for blood clots. Taking ASA daily Last Stroke Hemorrhagic,     Guidelines    Guideline Title Affirmed Question Affirmed Notes  Weakness (Generalized) and Fatigue [1] Drinking very little AND [2] dehydration suspected (e.g., no urine > 12 hours, very dry mouth, very lightheaded)    Final Disposition User   Go to ED Now (or PCP triage) Germain Osgood, RN, Jane    Comments  No appointments available.  Caller reported after preliminary assessment that patient had been confused several days this week but does not believe he is currently confused.   Referrals  [facility] - ED   Disagree/Comply: Comply

## 2015-09-09 NOTE — ED Notes (Signed)
IV attempt X2 

## 2015-09-09 NOTE — ED Notes (Signed)
CBG 136 

## 2015-09-09 NOTE — Discharge Instructions (Signed)
FOLLOW UP WITH YOUR DOCTOR FOR RECHECK AS NEEDED. RETURN TO THE EMERGENCY DEPARTMENT WITH ANY WORSENING SYMPTOMS OR NEW CONCERNS.

## 2015-09-09 NOTE — ED Provider Notes (Signed)
CSN: [id number]     Arrival date & time 09/09/15  1845 History   First MD Initiated Contact with Patient 09/09/15 2008     Chief Complaint  Patient presents with  . Altered Mental Status     (Consider location/radiation/quality/duration/timing/severity/associated sxs/prior Treatment) Patient is a 54 y.o. male presenting with altered mental status. The history is provided by the patient. No language interpreter was used.  Altered Mental Status Presenting symptoms: behavior changes and confusion   Severity:  Moderate Duration:  3 days Associated symptoms: no fever   Associated symptoms comment:  The patient is brought in by his wife who reports that he has been confused, has had an altered mental status and decreased appetite over the last 3-4 days. No fever, vomiting, complaint of pain. He has a history of stroke and, at baseline, is minimally active but ambulatory, verbal, usually incontinent of bowel and bladder. He has had no recent falls or trauma. She states he has refused to eat at night when she comes home from work which is unusual. She was concerned that he had another stroke and brought him in for evaluation. The patient denies pain. He is able to contribute to history with direct questioning.   Past Medical History  Diagnosis Date  . GERD (gastroesophageal reflux disease)   . HLD (hyperlipidemia)   . HTN (hypertension)   . Hematuria 8/12  . PFO (patent foramen ovale)     a. echo 8/12: EF 55-65%, grade 2 diast dysfnx; +PFO on bubble study. b. TEE 4/13: EF normal, atrial septum with suspicion for interatrial septum fenestrations without flow across and few large bubbles noted in LA.  This was not felt to require closure   . CAD (coronary artery disease)     a. s/p promus DES circumflex artery 10/13/10. b. Cath 04/2013: stable disease but possible small vessel disease -Imdur added.  Marland Kitchen DVT (deep venous thrombosis) 2013    a. 2013. b. 2015 - reported BLE DVT s/p IVC filter.   Marland Kitchen TIA  (transient ischemic attack)     "1-2; both after the one in 02/2010" (09/25/2013)  . Type II diabetes mellitus   . Arthritis     "knees" (09/25/2013)  . Rheumatoid arthritis(714.0)   . Folate deficiency anemia   . Carotid artery disease     a. 1-29% BICA by study 04/2014.  Marland Kitchen BPH (benign prostatic hyperplasia)   . Basal ganglia hemorrhage 2015    a. 2015 - with intraventricular extension and hydrocephalus requiring right crani and VP shunt, complicated by encephalopathy, protein calorie malnutrition, acute resp failure req trache and PEG, BLE DVT s/p IVC, enterobacter PNA, subsequent right hemiparesis and difficulty communicating.  Marland Kitchen Hydrocephalus 2015    a. 2/2 BG hemorrhage.  . Acute respiratory failure 2015    a. Following BG hemorrhage - s/p trache with decannulation.  . Enterobacter cloacae pneumonia 2015  . S/P percutaneous endoscopic gastrostomy (PEG) tube placement 2015  . Protein calorie malnutrition   . LV dysfunction     a. 2011 - EF 45-50%, subsequent 55-60% in 04/2014.  Marland Kitchen Noncompliance   . Falls   . Urinary incontinence   . CVA (cerebral vascular accident) 02/2010    a. CVA 2011, prior reported TIAs. b. CNS hemorrhage 2015.  Marland Kitchen CVA (cerebral vascular accident) 06/2014    "hemorrhagic; in [place]; brain activity diminshed; ability to do anything; can't do ADLs on own"  . OSA on CPAP     "not using CPAP right now" (  11/30/2014)  . Incontinence of feces    Past Surgical History  Procedure Laterality Date  . Knee arthroscopy Bilateral 2000's    2 on left and 3 on rt  . Tee without cardioversion  03/21/2012    Procedure: TRANSESOPHAGEAL ECHOCARDIOGRAM (TEE);  Surgeon: Fay Records, MD;  Location: [facility] ENDOSCOPY;  Service: Cardiovascular;  Laterality: N/A;  . Coronary angioplasty with stent placement  2012    "1" (09/24/2013)  . Left heart catheterization with coronary angiogram N/A 04/24/2013    Procedure: LEFT HEART CATHETERIZATION WITH CORONARY ANGIOGRAM;  Surgeon: Burnell Blanks, MD;  Location: [facility] CATH LAB;  Service: Cardiovascular;  Laterality: N/A;   Family History  Problem Relation Age of Onset  . Cancer Neg Hx   . Kidney disease Neg Hx   . Diabetes Neg Hx   . Heart attack Sister   . Heart attack Father   . Hypertension Brother   . Stroke Father   . Seizures Sister   . Stroke Sister    Social History  Substance Use Topics  . Smoking status: Former Smoker -- 1.00 packs/day for 35 years    Types: Cigarettes    Quit date: 03/10/2012  . Smokeless tobacco: Never Used  . Alcohol Use: No    Review of Systems  Constitutional: Negative for fever and chills.  HENT: Negative.   Respiratory: Negative.   Cardiovascular: Negative.   Gastrointestinal: Negative.   Musculoskeletal: Negative.   Skin: Negative.   Neurological: Negative.   Psychiatric/Behavioral: Positive for confusion.      Allergies  Iodine and Amlodipine  Home Medications   Prior to Admission medications   Medication Sig Start Date End Date Taking? Authorizing Provider  atorvastatin (LIPITOR) 80 MG tablet Take 1 tablet (80 mg total) by mouth every evening. 11/03/14  Yes Dayna N Dunn, PA-C  furosemide (LASIX) 40 MG tablet Take 1 tablet (40 mg total) by mouth 2 (two) times daily. 06/03/15  Yes Burnell Blanks, MD  lisinopril (PRINIVIL,ZESTRIL) 20 MG tablet Take 1 tablet (20 mg total) by mouth daily. 06/03/15  Yes Burnell Blanks, MD  metoprolol succinate (TOPROL-XL) 50 MG 24 hr tablet Take 1 and a half tablets by mouth twice a day. Take with or immediately following a meal. Patient taking differently: Take 50 mg by mouth daily. Take 1 and a half tablets by mouth twice a day. Take with or immediately following a meal. 07/05/15  Yes Dayna N Dunn, PA-C  Potassium Chloride 40 MEQ/15ML (20%) SOLN TAKE 80 MEQ   (30 MILLILITERS) BY MOUTH 2 (TWO) TIMES DAILY. 08/22/15  Yes Burnell Blanks, MD  atorvastatin (LIPITOR) 80 MG tablet TAKE 1 TABLET BY MOUTH EVERY NIGHT AT  BEDTIME Patient not taking: Reported on 09/09/2015 06/03/15   Burnell Blanks, MD  atorvastatin (LIPITOR) 80 MG tablet TAKE 1 TABLET BY MOUTH EVERY NIGHT AT BEDTIME Patient not taking: Reported on 09/09/2015 08/24/15   Burnell Blanks, MD   BP 162/85 mmHg  Pulse 66  Temp(Src) 97.5 F (36.4 C) (Oral)  Resp 16  SpO2 97% Physical Exam  Constitutional: He appears well-developed and well-nourished.  HENT:  Head: Normocephalic.  Neck: Normal range of motion. Neck supple.  Cardiovascular: Normal rate and regular rhythm.   Pulmonary/Chest: Effort normal and breath sounds normal. He has no wheezes. He has no rales.  Abdominal: Soft. Bowel sounds are normal. There is no tenderness. There is no rebound and no guarding.  Musculoskeletal: Normal range of motion.  Neurological: He is alert.  He is oriented to person and place, disoriented to time - baseline per wife. He has left sided hemiparesis, greater in upper extremity.   Skin: Skin is warm and dry. No rash noted.  Psychiatric: He has a normal mood and affect.    ED Course  Procedures (including critical care time) Labs Review Labs Reviewed  COMPREHENSIVE METABOLIC PANEL - Abnormal; Notable for the following:    Potassium 3.3 (*)    Glucose, Bld 156 (*)    Creatinine, Ser 1.26 (*)    Alkaline Phosphatase 143 (*)    Total Bilirubin 1.5 (*)    All other components within normal limits  CBG MONITORING, ED - Abnormal; Notable for the following:    Glucose-Capillary 111 (*)    All other components within normal limits  I-STAT CHEM 8, ED - Abnormal; Notable for the following:    Potassium 3.2 (*)    Chloride 98 (*)    Creatinine, Ser 1.30 (*)    Glucose, Bld 153 (*)    Hemoglobin 17.3 (*)    All other components within normal limits  URINE CULTURE  PROTIME-INR  APTT  CBC  DIFFERENTIAL  URINALYSIS, ROUTINE W REFLEX MICROSCOPIC (NOT AT [facility])  LACTIC ACID, PLASMA  LACTIC ACID, PLASMA  I-STAT TROPOININ, ED  CBG  MONITORING, ED   Results for orders placed or performed during the hospital encounter of 09/09/15  Urine culture  Result Value Ref Range   Specimen Description URINE, CLEAN CATCH    Special Requests NONE    Culture MULTIPLE SPECIES PRESENT, SUGGEST RECOLLECTION    Report Status 09/11/2015 FINAL   Protime-INR  Result Value Ref Range   Prothrombin Time 14.8 11.6 - 15.2 seconds   INR 1.14 0.00 - 1.49  APTT  Result Value Ref Range   aPTT 33 24 - 37 seconds  CBC  Result Value Ref Range   WBC 7.6 4.0 - 10.5 K/uL   RBC 5.14 4.22 - 5.81 MIL/uL   Hemoglobin 15.6 13.0 - 17.0 g/dL   HCT 47.1 39.0 - 52.0 %   MCV 91.6 78.0 - 100.0 fL   MCH 30.4 26.0 - 34.0 pg   MCHC 33.1 30.0 - 36.0 g/dL   RDW 13.2 11.5 - 15.5 %   Platelets 257 150 - 400 K/uL  Differential  Result Value Ref Range   Neutrophils Relative % 72 %   Neutro Abs 5.5 1.7 - 7.7 K/uL   Lymphocytes Relative 21 %   Lymphs Abs 1.6 0.7 - 4.0 K/uL   Monocytes Relative 4 %   Monocytes Absolute 0.3 0.1 - 1.0 K/uL   Eosinophils Relative 3 %   Eosinophils Absolute 0.3 0.0 - 0.7 K/uL   Basophils Relative 0 %   Basophils Absolute 0.0 0.0 - 0.1 K/uL  Comprehensive metabolic panel  Result Value Ref Range   Sodium 140 135 - 145 mmol/L   Potassium 3.3 (L) 3.5 - 5.1 mmol/L   Chloride 101 101 - 111 mmol/L   CO2 29 22 - 32 mmol/L   Glucose, Bld 156 (H) 65 - 99 mg/dL   BUN 9 6 - 20 mg/dL   Creatinine, Ser 1.26 (H) 0.61 - 1.24 mg/dL   Calcium 9.6 8.9 - 10.3 mg/dL   Total Protein 7.9 6.5 - 8.1 g/dL   Albumin 4.5 3.5 - 5.0 g/dL   AST 23 15 - 41 U/L   ALT 22 17 - 63 U/L   Alkaline Phosphatase 143 (H) 38 -  126 U/L   Total Bilirubin 1.5 (H) 0.3 - 1.2 mg/dL   GFR calc non Af Amer >60 >60 mL/min   GFR calc Af Amer >60 >60 mL/min   Anion gap 10 5 - 15  Urinalysis, Routine w reflex microscopic (not at [facility]  Result Value Ref Range   Color, Urine AMBER (A) YELLOW   APPearance CLOUDY (A) CLEAR   Specific Gravity, Urine 1.021 1.005 - 1.030    pH 6.0 5.0 - 8.0   Glucose, UA NEGATIVE NEGATIVE mg/dL   Hgb urine dipstick SMALL (A) NEGATIVE   Bilirubin Urine SMALL (A) NEGATIVE   Ketones, ur 15 (A) NEGATIVE mg/dL   Protein, ur 100 (A) NEGATIVE mg/dL   Urobilinogen, UA 1.0 0.0 - 1.0 mg/dL   Nitrite NEGATIVE NEGATIVE   Leukocytes, UA SMALL (A) NEGATIVE  Lactic acid, plasma  Result Value Ref Range   Lactic Acid, Venous 1.8 0.5 - 2.0 mmol/L  Urine microscopic-add on  Result Value Ref Range   Squamous Epithelial / LPF RARE RARE   WBC, UA 7-10 <3 WBC/hpf   RBC / HPF 3-6 <3 RBC/hpf   Bacteria, UA RARE RARE   Casts HYALINE CASTS (A) NEGATIVE   Crystals CA OXALATE CRYSTALS (A) NEGATIVE  CBG monitoring, ED  Result Value Ref Range   Glucose-Capillary 111 (H) 65 - 99 mg/dL  I-stat troponin, ED (not at [facility])  Result Value Ref Range   Troponin i, poc 0.00 0.00 - 0.08 ng/mL   Comment 3          CBG monitoring, ED  Result Value Ref Range   Glucose-Capillary 136 (H) 65 - 99 mg/dL  I-Stat Chem 8, ED  (not at [facility])  Result Value Ref Range   Sodium 143 135 - 145 mmol/L   Potassium 3.2 (L) 3.5 - 5.1 mmol/L   Chloride 98 (L) 101 - 111 mmol/L   BUN 11 6 - 20 mg/dL   Creatinine, Ser 1.30 (H) 0.61 - 1.24 mg/dL   Glucose, Bld 153 (H) 65 - 99 mg/dL   Calcium, Ion 1.17 1.12 - 1.23 mmol/L   TCO2 28 0 - 100 mmol/L   Hemoglobin 17.3 (H) 13.0 - 17.0 g/dL   HCT 51.0 39.0 - 52.0 %    Imaging Review Dg Chest 2 View  09/09/2015   CLINICAL DATA:  Acute onset of shortness of breath and generalized chest pain. Slight cough. Headache. Initial encounter.  EXAM: CHEST  2 VIEW  COMPARISON:  Chest radiograph performed 02/11/2015  FINDINGS: The lungs are well-aerated and clear. There is no evidence of focal opacification, pleural effusion or pneumothorax.  The heart is normal in size; the mediastinal contour is within normal limits. No acute osseous abnormalities are seen. A right-sided ventriculoperitoneal shunt is partially imaged.  IMPRESSION: No  acute cardiopulmonary process seen.   Electronically Signed   By: Garald Balding M.D.   On: 09/09/2015 22:20   Ct Head Wo Contrast  09/09/2015   CLINICAL DATA:  Altered mental status.  History of hydrocephalus  EXAM: CT HEAD WITHOUT CONTRAST  TECHNIQUE: Contiguous axial images were obtained from the base of the skull through the vertex without intravenous contrast.  COMPARISON:  August 03, 2015  FINDINGS: Ventriculoperitoneal shunt catheter tip is in the posterior aspect of the frontal horn of the right lateral ventricle. There is decompression of the right lateral ventricle. The left lateral ventricle is normal in size and contour. Third and fourth ventricles appear normal in size and  contour. The appearance of the ventricles is stable compared to recent prior study.  There is no mass, hemorrhage, extra-axial fluid collection, or midline shift. Small vessel disease in the centra semiovale bilaterally is stable. There is no new gray-white compartment lesion. No acute infarct is identified. Bony calvarium appears intact except for the burr hole in the right frontal lobe for placement of the shunt catheter. Mastoid air cells are clear. No intraorbital lesions are appreciable.  IMPRESSION: Stable appearing ventricles and shunt catheter placement. No hydrocephalus is currently apparent. There is small vessel disease in the periventricular white matter, stable. No acute infarct apparent. No hemorrhage or mass effect. No midline shift or extra-axial fluid collection.   Electronically Signed   By: Lowella Grip III M.D.   On: 09/09/2015 19:30   I have personally reviewed and evaluated these images and lab results as part of my medical decision-making.   EKG Interpretation None      MDM   Final diagnoses:  None    1. Weakness  The patient's wife relates that he had a large bowel movement just prior to leaving the house to come to the ED and since that time his behavior and affect have improved. She  feels he is at baseline now. CT's, labs unremarkable. No evidence infection. He is responsive and his responsiveness is appropriate for his usual state of post CVA baseline.   Wife is comfortable taking him home with PCP follow up for recheck this week.   Charlann Lange, PA-C 09/12/15 [place], MD 09/13/15 [phone]

## 2015-09-09 NOTE — ED Notes (Signed)
The pt has been altered since Tuesday.  He has had 3 strokes in the past  The last one was July 2015.  His urine is dark.  His wife feels like he has been altered since tuesday

## 2015-09-09 NOTE — ED Notes (Signed)
[place] Placed on Pt.

## 2015-09-10 LAB — CBG MONITORING, ED: GLUCOSE-CAPILLARY: 136 mg/dL — AB (ref 65–99)

## 2015-09-11 LAB — URINE CULTURE

## 2015-09-12 NOTE — Telephone Encounter (Signed)
Routing as fyi, pt admitted to hospital

## 2015-09-28 ENCOUNTER — Ambulatory Visit: Payer: BC Managed Care – PPO | Admitting: Podiatry

## 2015-10-25 NOTE — Therapy (Signed)
[facility] [address] [place], [place], [address] Phone: [phone]   Fax:  [phone]  Patient Details  Name: Tony Davis. MRN: [id number] Date of Birth: 09/11/1961 Referring Provider:  No ref. provider found  Encounter Date: 10/25/2015  Pt was seen for one speech therapy session after his evaluation in late February 2016. Last visit was in early March 2016, when pt was unable to be seen due to medical issues (blood pressure consistently elevated).   No goals were met.   Pt never rescheduled therapy sessions and it is assumed he/family did not desire to return to therapy.  [facility] , [place], [place] 10/25/2015, 9:27 AM  [facility] [address] [place], [place], [address] Phone: [phone]   Fax:  [phone]

## 2015-11-27 ENCOUNTER — Encounter: Payer: Self-pay | Admitting: Internal Medicine

## 2015-11-28 ENCOUNTER — Observation Stay (HOSPITAL_COMMUNITY)
Admission: EM | Admit: 2015-11-28 | Discharge: 2015-11-30 | Disposition: A | Discharge status: Home / Self Care | Payer: BC Managed Care – PPO | Attending: Internal Medicine | Admitting: Internal Medicine

## 2015-11-28 ENCOUNTER — Telehealth: Payer: Self-pay

## 2015-11-28 ENCOUNTER — Encounter (HOSPITAL_COMMUNITY): Payer: Self-pay | Admitting: Cardiology

## 2015-11-28 ENCOUNTER — Emergency Department (HOSPITAL_COMMUNITY): Payer: BC Managed Care – PPO

## 2015-11-28 DIAGNOSIS — I10 Essential (primary) hypertension: Secondary | ICD-10-CM | POA: Diagnosis not present

## 2015-11-28 DIAGNOSIS — E860 Dehydration: Secondary | ICD-10-CM | POA: Diagnosis not present

## 2015-11-28 DIAGNOSIS — R55 Syncope and collapse: Principal | ICD-10-CM | POA: Diagnosis present

## 2015-11-28 DIAGNOSIS — E119 Type 2 diabetes mellitus without complications: Secondary | ICD-10-CM | POA: Diagnosis not present

## 2015-11-28 DIAGNOSIS — I509 Heart failure, unspecified: Secondary | ICD-10-CM

## 2015-11-28 DIAGNOSIS — Z8673 Personal history of transient ischemic attack (TIA), and cerebral infarction without residual deficits: Secondary | ICD-10-CM | POA: Insufficient documentation

## 2015-11-28 DIAGNOSIS — R531 Weakness: Secondary | ICD-10-CM | POA: Diagnosis not present

## 2015-11-28 DIAGNOSIS — Z79899 Other long term (current) drug therapy: Secondary | ICD-10-CM | POA: Diagnosis not present

## 2015-11-28 DIAGNOSIS — I2510 Atherosclerotic heart disease of native coronary artery without angina pectoris: Secondary | ICD-10-CM | POA: Diagnosis not present

## 2015-11-28 DIAGNOSIS — E785 Hyperlipidemia, unspecified: Secondary | ICD-10-CM | POA: Diagnosis not present

## 2015-11-28 DIAGNOSIS — I519 Heart disease, unspecified: Secondary | ICD-10-CM | POA: Diagnosis present

## 2015-11-28 DIAGNOSIS — N179 Acute kidney failure, unspecified: Secondary | ICD-10-CM | POA: Insufficient documentation

## 2015-11-28 DIAGNOSIS — Z86718 Personal history of other venous thrombosis and embolism: Secondary | ICD-10-CM | POA: Insufficient documentation

## 2015-11-28 DIAGNOSIS — R7303 Prediabetes: Secondary | ICD-10-CM | POA: Diagnosis present

## 2015-11-28 DIAGNOSIS — Z87891 Personal history of nicotine dependence: Secondary | ICD-10-CM | POA: Diagnosis not present

## 2015-11-28 DIAGNOSIS — I5032 Chronic diastolic (congestive) heart failure: Secondary | ICD-10-CM | POA: Insufficient documentation

## 2015-11-28 LAB — COMPREHENSIVE METABOLIC PANEL
ALK PHOS: 112 U/L (ref 38–126)
ALT: 14 U/L — AB (ref 17–63)
ANION GAP: 11 (ref 5–15)
AST: 20 U/L (ref 15–41)
Albumin: 4 g/dL (ref 3.5–5.0)
BUN: 12 mg/dL (ref 6–20)
CALCIUM: 9.3 mg/dL (ref 8.9–10.3)
CO2: 24 mmol/L (ref 22–32)
CREATININE: 1.35 mg/dL — AB (ref 0.61–1.24)
Chloride: 107 mmol/L (ref 101–111)
GFR, EST NON AFRICAN AMERICAN: 59 mL/min — AB (ref >60)
Glucose, Bld: 130 mg/dL — ABNORMAL HIGH (ref 65–99)
Potassium: 3.7 mmol/L (ref 3.5–5.1)
Sodium: 142 mmol/L (ref 135–145)
TOTAL PROTEIN: 7 g/dL (ref 6.5–8.1)
Total Bilirubin: 0.9 mg/dL (ref 0.3–1.2)

## 2015-11-28 LAB — DIFFERENTIAL
Basophils Absolute: 0 10*3/uL (ref 0.0–0.1)
Basophils Relative: 1 %
EOS PCT: 4 %
Eosinophils Absolute: 0.3 10*3/uL (ref 0.0–0.7)
LYMPHS PCT: 30 %
Lymphs Abs: 2 10*3/uL (ref 0.7–4.0)
MONO ABS: 0.2 10*3/uL (ref 0.1–1.0)
MONOS PCT: 4 %
NEUTROS ABS: 4.1 10*3/uL (ref 1.7–7.7)
Neutrophils Relative %: 61 %

## 2015-11-28 LAB — CBC
HEMATOCRIT: 48 % (ref 39.0–52.0)
Hemoglobin: 15.8 g/dL (ref 13.0–17.0)
MCH: 30.6 pg (ref 26.0–34.0)
MCHC: 32.9 g/dL (ref 30.0–36.0)
MCV: 93 fL (ref 78.0–100.0)
Platelets: 262 10*3/uL (ref 150–400)
RBC: 5.16 MIL/uL (ref 4.22–5.81)
RDW: 12.9 % (ref 11.5–15.5)
WBC: 6.7 10*3/uL (ref 4.0–10.5)

## 2015-11-28 LAB — I-STAT CHEM 8, ED
BUN: 15 mg/dL (ref 6–20)
CALCIUM ION: 1.12 mmol/L (ref 1.12–1.23)
CREATININE: 1.2 mg/dL (ref 0.61–1.24)
Chloride: 104 mmol/L (ref 101–111)
Glucose, Bld: 129 mg/dL — ABNORMAL HIGH (ref 65–99)
HCT: 50 % (ref 39.0–52.0)
Hemoglobin: 17 g/dL (ref 13.0–17.0)
Potassium: 3.4 mmol/L — ABNORMAL LOW (ref 3.5–5.1)
Sodium: 144 mmol/L (ref 135–145)
TCO2: 26 mmol/L (ref 0–100)

## 2015-11-28 LAB — I-STAT TROPONIN, ED: TROPONIN I, POC: 0 ng/mL (ref 0.00–0.08)

## 2015-11-28 LAB — PROTIME-INR
INR: 1.09 (ref 0.00–1.49)
PROTHROMBIN TIME: 14.3 s (ref 11.6–15.2)

## 2015-11-28 LAB — APTT: aPTT: 31 seconds (ref 24–37)

## 2015-11-28 LAB — CBG MONITORING, ED: GLUCOSE-CAPILLARY: 87 mg/dL (ref 65–99)

## 2015-11-28 MED ORDER — ONDANSETRON HCL 4 MG/2ML IJ SOLN: 4.0000 mg | Freq: Three times a day (TID) | INTRAMUSCULAR | Status: DC | PRN

## 2015-11-28 NOTE — ED Notes (Signed)
CBG was 87

## 2015-11-28 NOTE — ED Provider Notes (Signed)
CSN: [id number]     Arrival date & time 11/28/15  1729 History   First MD Initiated Contact with Patient 11/28/15 2000     Chief Complaint  Patient presents with  . Loss of Consciousness  . Weakness     (Consider location/radiation/quality/duration/timing/severity/associated sxs/prior Treatment) Patient is a 54 y.o. male presenting with syncope. The history is provided by the patient. No language interpreter was used.  Loss of Consciousness Episode history:  Single Most recent episode:  Yesterday Progression:  Worsening Chronicity:  New Context: standing up   Witnessed: no   Relieved by:  Nothing Worsened by:  Nothing tried Ineffective treatments:  None tried Associated symptoms: malaise/fatigue and weakness   Associated symptoms: no nausea and no vomiting   Risk factors: vascular disease   Pt's wife reports pt has been falling asleep/nodding off frequently recently.   Pt has a cva in August and has had right sided weakness since. Yesterday pt collapsed while walking with wife and son.  Pt walks with with a cane.  Wife reports pt closed eyes and dropped.  Pt is followed by Dr. Tomi Likens Neurologist.  He also sees Dr. [name].  Pt's wife spoke to MD who advised her to bring pt in.   Past Medical History  Diagnosis Date  . GERD (gastroesophageal reflux disease)   . HLD (hyperlipidemia)   . HTN (hypertension)   . Hematuria 8/12  . PFO (patent foramen ovale)     a. echo 8/12: EF 55-65%, grade 2 diast dysfnx; +PFO on bubble study. b. TEE 4/13: EF normal, atrial septum with suspicion for interatrial septum fenestrations without flow across and few large bubbles noted in LA.  This was not felt to require closure   . CAD (coronary artery disease)     a. s/p promus DES circumflex artery 10/13/10. b. Cath 04/2013: stable disease but possible small vessel disease -Imdur added.  Marland Kitchen DVT (deep venous thrombosis) ([place]) 2013    a. 2013. b. 2015 - reported BLE DVT s/p IVC filter.   Marland Kitchen TIA (transient  ischemic attack)     "1-2; both after the one in 02/2010" (09/25/2013)  . Type II diabetes mellitus ([place])   . Arthritis     "knees" (09/25/2013)  . Rheumatoid arthritis(714.0)   . Folate deficiency anemia   . Carotid artery disease ([place])     a. 1-29% BICA by study 04/2014.  Marland Kitchen BPH (benign prostatic hyperplasia)   . Basal ganglia hemorrhage ([place]) 2015    a. 2015 - with intraventricular extension and hydrocephalus requiring right crani and VP shunt, complicated by encephalopathy, protein calorie malnutrition, acute resp failure req trache and PEG, BLE DVT s/p IVC, enterobacter PNA, subsequent right hemiparesis and difficulty communicating.  Marland Kitchen Hydrocephalus 2015    a. 2/2 BG hemorrhage.  . Acute respiratory failure ([place]) 2015    a. Following BG hemorrhage - s/p trache with decannulation.  . Enterobacter cloacae pneumonia ([place]) 2015  . S/P percutaneous endoscopic gastrostomy (PEG) tube placement ([place]) 2015  . Protein calorie malnutrition ([place])   . LV dysfunction     a. 2011 - EF 45-50%, subsequent 55-60% in 04/2014.  Marland Kitchen Noncompliance   . Falls   . Urinary incontinence   . CVA (cerebral vascular accident) ([place]) 02/2010    a. CVA 2011, prior reported TIAs. b. CNS hemorrhage 2015.  Marland Kitchen CVA (cerebral vascular accident) ([place]) 06/2014    "hemorrhagic; in [place]; brain activity diminshed; ability to do anything; can't do ADLs on own"  .  OSA on CPAP     "not using CPAP right now" (11/30/2014)  . Incontinence of feces    Past Surgical History  Procedure Laterality Date  . Knee arthroscopy Bilateral 2000's    2 on left and 3 on rt  . Tee without cardioversion  03/21/2012    Procedure: TRANSESOPHAGEAL ECHOCARDIOGRAM (TEE);  Surgeon: Fay Records, MD;  Location[facility];  Service: Cardiovascular;  Laterality: N/A;  . Coronary angioplasty with stent placement  2012    "1" (09/24/2013)  . Left heart catheterization with coronary angiogram N/A 04/24/2013    Procedure: LEFT HEART CATHETERIZATION  WITH CORONARY ANGIOGRAM;  Surgeon: Burnell Blanks, MD;  Location: [facility] CATH LAB;  Service: Cardiovascular;  Laterality: N/A;   Family History  Problem Relation Age of Onset  . Cancer Neg Hx   . Kidney disease Neg Hx   . Diabetes Neg Hx   . Heart attack Sister   . Heart attack Father   . Hypertension Brother   . Stroke Father   . Seizures Sister   . Stroke Sister    Social History  Substance Use Topics  . Smoking status: Former Smoker -- 1.00 packs/day for 35 years    Types: Cigarettes    Quit date: 03/10/2012  . Smokeless tobacco: Never Used  . Alcohol Use: No    Review of Systems  Constitutional: Positive for malaise/fatigue.  Cardiovascular: Positive for syncope.  Gastrointestinal: Negative for nausea and vomiting.  Neurological: Positive for weakness.  All other systems reviewed and are negative.     Allergies  Iodine and Amlodipine  Home Medications   Prior to Admission medications   Medication Sig Start Date End Date Taking? Authorizing Provider  aspirin EC 81 MG tablet Take 81 mg by mouth daily.   Yes Historical Provider, MD  escitalopram (LEXAPRO) 10 MG tablet Take 10 mg by mouth every morning.    Yes Historical Provider, MD  lisinopril (PRINIVIL,ZESTRIL) 20 MG tablet Take 1 tablet (20 mg total) by mouth daily. 06/03/15  Yes Burnell Blanks, MD  metoprolol succinate (TOPROL-XL) 50 MG 24 hr tablet Take 1 and a half tablets by mouth twice a day. Take with or immediately following a meal. Patient taking differently: Take 75 mg by mouth daily. Take 1 and a half tablets by mouth twice a day. Take with or immediately following a meal. 07/05/15  Yes Dayna N Dunn, PA-C  Potassium Chloride 40 MEQ/15ML (20%) SOLN TAKE 80 MEQ   (30 MILLILITERS) BY MOUTH 2 (TWO) TIMES DAILY. 08/22/15  Yes Burnell Blanks, MD  atorvastatin (LIPITOR) 80 MG tablet Take 1 tablet (80 mg total) by mouth every evening. Patient not taking: Reported on 11/28/2015 11/03/14   Dayna N  Dunn, PA-C  atorvastatin (LIPITOR) 80 MG tablet TAKE 1 TABLET BY MOUTH EVERY NIGHT AT BEDTIME Patient not taking: Reported on 09/09/2015 06/03/15   Burnell Blanks, MD  atorvastatin (LIPITOR) 80 MG tablet TAKE 1 TABLET BY MOUTH EVERY NIGHT AT BEDTIME Patient not taking: Reported on 09/09/2015 08/24/15   Burnell Blanks, MD  furosemide (LASIX) 40 MG tablet Take 1 tablet (40 mg total) by mouth 2 (two) times daily. Patient not taking: Reported on 11/28/2015 06/03/15   Burnell Blanks, MD   BP 143/96 mmHg  Pulse 70  Temp(Src) 98.2 F (36.8 C) (Oral)  Resp 14  SpO2 100% Physical Exam  Constitutional: He is oriented to person, place, and time. He appears well-developed and well-nourished.  HENT:  Head: Normocephalic and atraumatic.  Right Ear: External ear normal.  Left Ear: External ear normal.  Nose: Nose normal.  Mouth/Throat: Oropharynx is clear and moist.  Eyes: Conjunctivae and EOM are normal. Pupils are equal, round, and reactive to light.  Neck: Normal range of motion.  Cardiovascular: Normal rate and normal heart sounds.   Pulmonary/Chest: Effort normal and breath sounds normal.  Abdominal: Soft. He exhibits no distension.  Musculoskeletal: Normal range of motion.  Neurological: He is alert and oriented to person, place, and time.  Psychiatric: He has a normal mood and affect.  Nursing note and vitals reviewed.   ED Course  Procedures (including critical care time) Labs Review Labs Reviewed  COMPREHENSIVE METABOLIC PANEL - Abnormal; Notable for the following:    Glucose, Bld 130 (*)    Creatinine, Ser 1.35 (*)    ALT 14 (*)    GFR calc non Af Amer 59 (*)    All other components within normal limits  I-STAT CHEM 8, ED - Abnormal; Notable for the following:    Potassium 3.4 (*)    Glucose, Bld 129 (*)    All other components within normal limits  PROTIME-INR  APTT  CBC  DIFFERENTIAL  I-STAT TROPOININ, ED  CBG MONITORING, ED    Imaging Review Ct  Head Wo Contrast  11/28/2015  CLINICAL DATA:  54 year old male with syncopal episode presenting with generalized weakness and confusion. EXAM: CT HEAD WITHOUT CONTRAST TECHNIQUE: Contiguous axial images were obtained from the base of the skull through the vertex without intravenous contrast. COMPARISON:  CT dated 09/09/2015 FINDINGS: Right frontal ventriculostomy shunt with tip in the frontal poles of the right lateral ventricle in stable positioning. There is stable decompressed appearance of the right lateral ventricle. The left ventricle appears stable in size compared to the prior study. The third and fourth ventricles appear normal for patient's age and stable in size. The sulci are appropriate in size for patient's age. Mild periventricular and deep white matter hypodensities represent chronic microvascular ischemic changes. There is no intracranial hemorrhage. No mass effect or midline shift identified. The visualized paranasal sinuses and mastoid air cells are well aerated. The calvarium is intact. IMPRESSION: No acute intracranial pathology. Stable appearance of the right ventriculostomy shunt and ventricular size. Electronically Signed   By: Anner Crete M.D.   On: 11/28/2015 19:42   I have personally reviewed and evaluated these images and lab results as part of my medical decision-making.   EKG Interpretation   Date/Time:  Monday November 28 2015 17:44:17 EST Ventricular Rate:  80 PR Interval:  150 QRS Duration: 90 QT Interval:  386 QTC Calculation: 445 R Axis:   4 Text Interpretation:  Normal sinus rhythm ST \\T \ T wave abnormality,  consider inferior ischemia Abnormal ECG No significant change was found  Confirmed by Wyvonnia Dusky  MD, STEPHEN [phone]) on 11/28/2015 5:53:31 PM      MDM EKG no acute change, troponin negative,  Ct scan no acute abnormality.  Dr. Ashok Cordia in to see.   MRI ordered.   I will consult hospitalist for admission and monitoring.     Final diagnoses:  Syncope  and collapse   Dr. Hal Hope will admit     Fransico Meadow, PA-C 11/28/15 [place], PA-C 11/28/15 [place], MD 11/29/15 [phone]

## 2015-11-28 NOTE — ED Notes (Signed)
The pt is alert but confused he just returned from [place] to all monitor lulse ox  Wife at the bedside

## 2015-11-28 NOTE — ED Notes (Signed)
[place] and had a syncopal episode. Family states the patient had a stroke last year but is currently at his baseline. Pt denies any pain but has hx of weakness on the right side. Pt alert to self.

## 2015-11-28 NOTE — Telephone Encounter (Signed)
Called patients wife about patient message that was sent. Had to leave wife a message on the number provided and tried to call the other number in the chart. If the patient or his wife were to call back please advise them to go to the ED for the patients symptoms or please send the call back to me for more questions.

## 2015-11-29 ENCOUNTER — Observation Stay (HOSPITAL_COMMUNITY): Payer: BC Managed Care – PPO

## 2015-11-29 ENCOUNTER — Encounter (HOSPITAL_COMMUNITY): Payer: Self-pay | Admitting: Internal Medicine

## 2015-11-29 DIAGNOSIS — R55 Syncope and collapse: Secondary | ICD-10-CM | POA: Diagnosis not present

## 2015-11-29 DIAGNOSIS — I509 Heart failure, unspecified: Secondary | ICD-10-CM | POA: Diagnosis not present

## 2015-11-29 DIAGNOSIS — I10 Essential (primary) hypertension: Secondary | ICD-10-CM | POA: Diagnosis not present

## 2015-11-29 LAB — URINALYSIS, ROUTINE W REFLEX MICROSCOPIC
BILIRUBIN URINE: NEGATIVE
Glucose, UA: NEGATIVE mg/dL
KETONES UR: NEGATIVE mg/dL
Leukocytes, UA: NEGATIVE
NITRITE: NEGATIVE
Protein, ur: NEGATIVE mg/dL
Specific Gravity, Urine: 1.017 (ref 1.005–1.030)
pH: 7 (ref 5.0–8.0)

## 2015-11-29 LAB — COMPREHENSIVE METABOLIC PANEL
ALBUMIN: 3.5 g/dL (ref 3.5–5.0)
ALT: 14 U/L — ABNORMAL LOW (ref 17–63)
ANION GAP: 9 (ref 5–15)
AST: 13 U/L — ABNORMAL LOW (ref 15–41)
Alkaline Phosphatase: 104 U/L (ref 38–126)
BILIRUBIN TOTAL: 0.7 mg/dL (ref 0.3–1.2)
BUN: 10 mg/dL (ref 6–20)
CO2: 26 mmol/L (ref 22–32)
Calcium: 8.9 mg/dL (ref 8.9–10.3)
Chloride: 105 mmol/L (ref 101–111)
Creatinine, Ser: 0.97 mg/dL (ref 0.61–1.24)
GFR calc Af Amer: >60 mL/min (ref >60)
GFR calc non Af Amer: >60 mL/min (ref >60)
Glucose, Bld: 122 mg/dL — ABNORMAL HIGH (ref 65–99)
POTASSIUM: 3 mmol/L — AB (ref 3.5–5.1)
SODIUM: 140 mmol/L (ref 135–145)
TOTAL PROTEIN: 6.4 g/dL — AB (ref 6.5–8.1)

## 2015-11-29 LAB — CBC WITH DIFFERENTIAL/PLATELET
BASOS ABS: 0 10*3/uL (ref 0.0–0.1)
Basophils Relative: 1 %
Eosinophils Absolute: 0.3 10*3/uL (ref 0.0–0.7)
Eosinophils Relative: 5 %
HEMATOCRIT: 42.9 % (ref 39.0–52.0)
HEMOGLOBIN: 13.9 g/dL (ref 13.0–17.0)
LYMPHS PCT: 37 %
Lymphs Abs: 2.3 10*3/uL (ref 0.7–4.0)
MCH: 30 pg (ref 26.0–34.0)
MCHC: 32.4 g/dL (ref 30.0–36.0)
MCV: 92.5 fL (ref 78.0–100.0)
MONO ABS: 0.2 10*3/uL (ref 0.1–1.0)
Monocytes Relative: 4 %
NEUTROS ABS: 3.3 10*3/uL (ref 1.7–7.7)
NEUTROS PCT: 53 %
Platelets: 251 10*3/uL (ref 150–400)
RBC: 4.64 MIL/uL (ref 4.22–5.81)
RDW: 13 % (ref 11.5–15.5)
WBC: 6.2 10*3/uL (ref 4.0–10.5)

## 2015-11-29 LAB — TROPONIN I
Troponin I: <0.03 ng/mL (ref <0.031)
Troponin I: <0.03 ng/mL (ref <0.031)

## 2015-11-29 LAB — URINE MICROSCOPIC-ADD ON

## 2015-11-29 MED ORDER — ONDANSETRON HCL 4 MG PO TABS: 4.0000 mg | ORAL_TABLET | Freq: Four times a day (QID) | ORAL | Status: DC | PRN

## 2015-11-29 MED ORDER — HYDRALAZINE HCL 20 MG/ML IJ SOLN
10.0000 mg | INTRAMUSCULAR | Status: DC | PRN
  Administered 2015-11-29: 10 mg via INTRAVENOUS
  Filled 2015-11-29: qty 1

## 2015-11-29 MED ORDER — ACETAMINOPHEN 650 MG RE SUPP: 650.0000 mg | Freq: Four times a day (QID) | RECTAL | Status: DC | PRN

## 2015-11-29 MED ORDER — ASPIRIN EC 81 MG PO TBEC
81.0000 mg | DELAYED_RELEASE_TABLET | Freq: Every day | ORAL | Status: DC
  Administered 2015-11-29 – 2015-11-30 (×2): 81 mg via ORAL
  Filled 2015-11-29 (×2): qty 1

## 2015-11-29 MED ORDER — ESCITALOPRAM OXALATE 10 MG PO TABS
10.0000 mg | ORAL_TABLET | Freq: Every morning | ORAL | Status: DC
  Administered 2015-11-29 – 2015-11-30 (×2): 10 mg via ORAL
  Filled 2015-11-29 (×2): qty 1

## 2015-11-29 MED ORDER — METOPROLOL SUCCINATE ER 50 MG PO TB24
75.0000 mg | ORAL_TABLET | Freq: Every day | ORAL | Status: DC
  Administered 2015-11-29 – 2015-11-30 (×2): 75 mg via ORAL
  Filled 2015-11-29 (×2): qty 1

## 2015-11-29 MED ORDER — ONDANSETRON HCL 4 MG/2ML IJ SOLN: 4.0000 mg | Freq: Four times a day (QID) | INTRAMUSCULAR | Status: DC | PRN

## 2015-11-29 MED ORDER — LISINOPRIL 20 MG PO TABS
20.0000 mg | ORAL_TABLET | Freq: Every day | ORAL | Status: DC
  Administered 2015-11-29 – 2015-11-30 (×2): 20 mg via ORAL
  Filled 2015-11-29 (×2): qty 1

## 2015-11-29 MED ORDER — ACETAMINOPHEN 325 MG PO TABS
650.0000 mg | ORAL_TABLET | Freq: Four times a day (QID) | ORAL | Status: DC | PRN
Start: 2015-11-29 — End: 2015-11-30
  Administered 2015-11-29: 650 mg via ORAL
  Filled 2015-11-29: qty 2

## 2015-11-29 MED ORDER — SODIUM CHLORIDE 0.9 % IV SOLN
INTRAVENOUS | Status: DC
  Administered 2015-11-29 – 2015-11-30 (×3): via INTRAVENOUS

## 2015-11-29 MED ORDER — POTASSIUM CHLORIDE CRYS ER 20 MEQ PO TBCR
40.0000 meq | EXTENDED_RELEASE_TABLET | Freq: Once | ORAL | Status: AC
  Administered 2015-11-29: 40 meq via ORAL
  Filled 2015-11-29: qty 2

## 2015-11-29 NOTE — Progress Notes (Signed)
Attempted to get report. ED nurse is to call back.

## 2015-11-29 NOTE — Progress Notes (Signed)
EEG completed; results pending.    

## 2015-11-29 NOTE — Procedures (Signed)
History: 54 yo M with transient loss of conciousness.   Sedation: None  Technique: This is a 21 channel routine scalp EEG performed at the bedside with bipolar and monopolar montages arranged in accordance to the international 10/20 system of electrode placement. One channel was dedicated to EKG recording.    Background: The is a well defined posterior dominant rhythm that achieves a maximal frequency of 7 Hz. There is theta range activity throughout the recording as well as bifrontally predominant beta activity. There is a breech rhythm seen in the right frontal region.   Photic stimulation: Physiologic driving is not performed  EEG Abnormalities: 1) Right frontal breech rhythm  Clinical Interpretation: This essentially normal EEG is recorded in the waking and drowsy state. Breech rhythm is consistent with his known ventriculostomy site. There was no seizure or seizure predisposition recorded on this study. Please note that a normal EEG does not preclude the possibility of epilepsy.   Roland Rack, MD [facility] [phone]  If 7pm- 7am, please page neurology on call as listed in [place].

## 2015-11-29 NOTE — Care Management Note (Addendum)
Case Management Note  Patient Details  Name: Tony Davis. MRN: [id number] Date of Birth: 08-Oct-1961  Subjective/Objective:                  Date-11-11-15 Initial Assessment Spoke with patient at the bedside along with wife [name] [phone].  Introduced self as Tourist information centre manager and explained role in discharge planning and how to be reached.  Verified patient lives at home with wife in [place].  Verified patient anticipates to go home with spouse,  at time of discharge and will have  part-time supervision by wife this time to best of their knowledge.  Patient has DME cane walker  wheelchair has CPAP for OSA but does not wear it.. Expressed potential need for no other DME.  Patient denied needing help with their medication.  Patient is driven by wife to MD appointments.  Verified patient has PCP [name]. Patient states they currently receive [place] services through no one.    Plan: CM will continue to follow for discharge planning and [facility] resources.   Carles Collet RN BSN CM [phone]  Action/Plan:   Expected Discharge Date:                  Expected Discharge Plan:  Home/Self Care  In-House Referral:     Discharge planning Services     Post Acute Care Choice:    Choice offered to:     DME Arranged:    DME Agency:     HH Arranged:    HH Agency:     Status of Service:  In process, will continue to follow  Medicare Important Message Given:    Date Medicare IM Given:    Medicare IM give by:    Date Additional Medicare IM Given:    Additional Medicare Important Message give by:     If discussed at [place] of Stay Meetings, dates discussed:    Additional Comments:  Spoke with patient's wife who is a Education officer, museum. She confirms that they are insured through [employer] and medicare. Patient suffered a hemorrhagic stroke with VP shunt placement, that left him with memory, communication, and cognitive deficits. She described his days as being home alone while she is at  work. He stays in a recliner with a depends on. She makes him breakfast and will feed it to him, and leave him food to feed himself, but he has not been remembering to feed himself throughout the day. She states that he is losing weight and remembers his last noted weight to be 50 pounds more than his admission weight. She states that she cannot afford private pay HHA at $1200 a month, she has been declined Medicaid and therefore not eligible for Caps, and he is not old enough for the PACE program. She states that her son and her are putting a "Nanny Cam" in the home so she can watch him through her phone while she is at work. CM has concerns for patient's safety. Patient's wife acknowledges that he needs more care and is seeking help. CM will assist as possible, and place CSW consult for SNF placement.  CM spoke with [name] at [facility] for Enrichment. Information left in patient's room for wife for this program that offers day care and respit care for adults as well as caregiver support and counseling.  CM spoke with [name] CSW and explained living environment to assist with placement if possible in SNF or memory care.  Carles Collet, RN 11/29/2015, 11:17  AM

## 2015-11-29 NOTE — Progress Notes (Signed)
Patient seen and examined, per wife now at baseline.  eeg no seizure, echo pending, ekg/tele so far unrevealing, replace k. Wife reported patient had significant weight loss,  ua pending,  Symptom likely from dehydration, continue ivf, f/u on ua.  Will need home health.

## 2015-11-29 NOTE — Progress Notes (Signed)
Received report from Cyndia Skeeters, RN from ED.

## 2015-11-29 NOTE — ED Notes (Signed)
The pt has had a previous hemorrhagic stroke in 2015  With residual rt sided weakness

## 2015-11-29 NOTE — H&P (Addendum)
[facility] History and Physical  [name] [id number] DOB: 1961/05/16 DOA: 11/28/2015  Referring physician: Ms. Jillyn Ledger. PCP: [name], MD  Specialists: Dr. Beverlee Nims. Cardiologist.  Chief Complaint: Loss of consciousness.  History obtained from patient's wife.  HPI: Tony Davis. is a 54 y.o. male with history of intracranial hemorrhage status post VP shunt placement with mild residual right-sided weakness, recurrent DVT status post IVC filter placement not on anticoagulations secondary to hemorrhagic CVA, CAD status post stenting with last cardiac cath in 2014 which was unremarkable, hypertension and hyperlipidemia was brought to the ER after patient was found to have transient loss of consciousness. Patient loss of consciousness happened on 11/27/2015 [place]. Patient transiently slumped down while before patient can raise the floor patient's family held him and placed him on a chair. Patient was appearing confused for a few minutes following which patient regained his full alertness. Has chronic incontinence of urine. Yesterday morning patient's family called their PCP and was instructed to come to the ER. CT of the head did not show anything acute. Patient since his last hemorrhagic CVA has been mostly somnolent. On exam patient has mildly weak on the right side which per the family is chronic. And patient also mildly somnolent which as per the patient's family has been chronic. Patient did not have any nausea vomiting diarrhea chest pain or shortness of breath. Patient has been admitted for further observation for syncope.   Review of Systems: As presented in the history of presenting illness, rest negative.  Past Medical History  Diagnosis Date  . GERD (gastroesophageal reflux disease)   . HLD (hyperlipidemia)   . HTN (hypertension)   . Hematuria 8/12  . PFO (patent foramen ovale)     a. echo 8/12: EF 55-65%, grade  2 diast dysfnx; +PFO on bubble study. b. TEE 4/13: EF normal, atrial septum with suspicion for interatrial septum fenestrations without flow across and few large bubbles noted in LA.  This was not felt to require closure   . CAD (coronary artery disease)     a. s/p promus DES circumflex artery 10/13/10. b. Cath 04/2013: stable disease but possible small vessel disease -Imdur added.  Marland Kitchen DVT (deep venous thrombosis) ([place]) 2013    a. 2013. b. 2015 - reported BLE DVT s/p IVC filter.   Marland Kitchen TIA (transient ischemic attack)     "1-2; both after the one in 02/2010" (09/25/2013)  . Type II diabetes mellitus ([place])   . Arthritis     "knees" (09/25/2013)  . Rheumatoid arthritis(714.0)   . Folate deficiency anemia   . Carotid artery disease ([place])     a. 1-29% BICA by study 04/2014.  Marland Kitchen BPH (benign prostatic hyperplasia)   . Basal ganglia hemorrhage ([place]) 2015    a. 2015 - with intraventricular extension and hydrocephalus requiring right crani and VP shunt, complicated by encephalopathy, protein calorie malnutrition, acute resp failure req trache and PEG, BLE DVT s/p IVC, enterobacter PNA, subsequent right hemiparesis and difficulty communicating.  Marland Kitchen Hydrocephalus 2015    a. 2/2 BG hemorrhage.  . Acute respiratory failure ([place]) 2015    a. Following BG hemorrhage - s/p trache with decannulation.  . Enterobacter cloacae pneumonia ([place]) 2015  . S/P percutaneous endoscopic gastrostomy (PEG) tube placement ([place]) 2015  . Protein calorie malnutrition ([place])   . LV dysfunction     a. 2011 - EF 45-50%, subsequent 55-60% in 04/2014.  Marland Kitchen Noncompliance   .  Falls   . Urinary incontinence   . CVA (cerebral vascular accident) ([place]) 02/2010    a. CVA 2011, prior reported TIAs. b. CNS hemorrhage 2015.  Marland Kitchen CVA (cerebral vascular accident) ([place]) 06/2014    "hemorrhagic; in [place]; brain activity diminshed; ability to do anything; can't do ADLs on own"  . OSA on CPAP     "not using CPAP right now" (11/30/2014)  . Incontinence  of feces    Past Surgical History  Procedure Laterality Date  . Knee arthroscopy Bilateral 2000's    2 on left and 3 on rt  . Tee without cardioversion  03/21/2012    Procedure: TRANSESOPHAGEAL ECHOCARDIOGRAM (TEE);  Surgeon: Fay Records, MD;  Location[facility] ENDOSCOPY;  Service: Cardiovascular;  Laterality: N/A;  . Coronary angioplasty with stent placement  2012    "1" (09/24/2013)  . Left heart catheterization with coronary angiogram N/A 04/24/2013    Procedure: LEFT HEART CATHETERIZATION WITH CORONARY ANGIOGRAM;  Surgeon: Burnell Blanks, MD;  Location: [facility] CATH LAB;  Service: Cardiovascular;  Laterality: N/A;   Social History:  reports that he quit smoking about 3 years ago. His smoking use included Cigarettes. He has a 35 pack-year smoking history. He has never used smokeless tobacco. He reports that he does not drink alcohol or use illicit drugs. Where does patient live home. Can patient participate in ADLs? Yes.  Allergies  Allergen Reactions  . Iodine Anaphylaxis  . Amlodipine Swelling and Other (See Comments)    Foot edema    Family History:  Family History  Problem Relation Age of Onset  . Cancer Neg Hx   . Kidney disease Neg Hx   . Diabetes Neg Hx   . Heart attack Sister   . Heart attack Father   . Hypertension Brother   . Stroke Father   . Seizures Sister   . Stroke Sister      Prior to Admission medications   Medication Sig Start Date End Date Taking? Authorizing Provider  aspirin EC 81 MG tablet Take 81 mg by mouth daily.   Yes Historical Provider, MD  escitalopram (LEXAPRO) 10 MG tablet Take 10 mg by mouth every morning.    Yes Historical Provider, MD  lisinopril (PRINIVIL,ZESTRIL) 20 MG tablet Take 1 tablet (20 mg total) by mouth daily. 06/03/15  Yes Burnell Blanks, MD  metoprolol succinate (TOPROL-XL) 50 MG 24 hr tablet Take 1 and a half tablets by mouth twice a day. Take with or immediately following a meal. Patient taking differently: Take 75 mg  by mouth daily. Take 1 and a half tablets by mouth twice a day. Take with or immediately following a meal. 07/05/15  Yes Dayna N Dunn, PA-C  Potassium Chloride 40 MEQ/15ML (20%) SOLN TAKE 80 MEQ   (30 MILLILITERS) BY MOUTH 2 (TWO) TIMES DAILY. 08/22/15  Yes Burnell Blanks, MD  atorvastatin (LIPITOR) 80 MG tablet Take 1 tablet (80 mg total) by mouth every evening. Patient not taking: Reported on 11/28/2015 11/03/14   Dayna N Dunn, PA-C  atorvastatin (LIPITOR) 80 MG tablet TAKE 1 TABLET BY MOUTH EVERY NIGHT AT BEDTIME Patient not taking: Reported on 09/09/2015 06/03/15   Burnell Blanks, MD  atorvastatin (LIPITOR) 80 MG tablet TAKE 1 TABLET BY MOUTH EVERY NIGHT AT BEDTIME Patient not taking: Reported on 09/09/2015 08/24/15   Burnell Blanks, MD  furosemide (LASIX) 40 MG tablet Take 1 tablet (40 mg total) by mouth 2 (two) times daily. Patient not taking: Reported on  11/28/2015 06/03/15   Burnell Blanks, MD    Physical Exam: Filed Vitals:   11/28/15 2045 11/28/15 2100 11/29/15 0025 11/29/15 0100  BP: 130/88 144/95 168/104 151/104  Pulse: 73 70 64 66  Temp:      TempSrc:      Resp: 16 16 16 16   SpO2: 100% 100% 100% 98%     General:  Moderately built and nourished.  Eyes: Anicteric no pallor.  ENT: No discharge from the ears eyes nose and mouth.  Neck: No mass felt. No neck rigidity.  Cardiovascular: S1-S2 heard.  Respiratory: No rhonchi or crepitations.  Abdomen: Soft nontender bowel sounds present.  Skin: No rash.  Musculoskeletal: No edema.  Psychiatric: Mildly somnolent.  Neurologic: Mildly somnolent but follows commands and moves all extremities with mild weakness of the right side but as per the patient's family is chronic. Perla positive. No facial asymmetry. Tongue is midline.  Labs on Admission:  Basic Metabolic Panel:  Recent Labs Lab 11/28/15 1802 11/28/15 1817  NA 142 144  K 3.7 3.4*  CL 107 104  CO2 24  --   GLUCOSE 130* 129*   BUN 12 15  CREATININE 1.35* 1.20  CALCIUM 9.3  --    Liver Function Tests:  Recent Labs Lab 11/28/15 1802  AST 20  ALT 14*  ALKPHOS 112  BILITOT 0.9  PROT 7.0  ALBUMIN 4.0   No results for input(s): LIPASE, AMYLASE in the last 168 hours. No results for input(s): AMMONIA in the last 168 hours. CBC:  Recent Labs Lab 11/28/15 1802 11/28/15 1817  WBC 6.7  --   NEUTROABS 4.1  --   HGB 15.8 17.0  HCT 48.0 50.0  MCV 93.0  --   PLT 262  --    Cardiac Enzymes: No results for input(s): CKTOTAL, CKMB, CKMBINDEX, TROPONINI in the last 168 hours.  BNP (last 3 results)  Recent Labs  11/30/14 1630  BNP 25.8    ProBNP (last 3 results) No results for input(s): PROBNP in the last 8760 hours.  CBG:  Recent Labs Lab 11/28/15 2331  GLUCAP 87    Radiological Exams on Admission: Ct Head Wo Contrast  11/28/2015  CLINICAL DATA:  54 year old male with syncopal episode presenting with generalized weakness and confusion. EXAM: CT HEAD WITHOUT CONTRAST TECHNIQUE: Contiguous axial images were obtained from the base of the skull through the vertex without intravenous contrast. COMPARISON:  CT dated 09/09/2015 FINDINGS: Right frontal ventriculostomy shunt with tip in the frontal poles of the right lateral ventricle in stable positioning. There is stable decompressed appearance of the right lateral ventricle. The left ventricle appears stable in size compared to the prior study. The third and fourth ventricles appear normal for patient's age and stable in size. The sulci are appropriate in size for patient's age. Mild periventricular and deep white matter hypodensities represent chronic microvascular ischemic changes. There is no intracranial hemorrhage. No mass effect or midline shift identified. The visualized paranasal sinuses and mastoid air cells are well aerated. The calvarium is intact. IMPRESSION: No acute intracranial pathology. Stable appearance of the right ventriculostomy shunt  and ventricular size. Electronically Signed   By: Anner Crete M.D.   On: 11/28/2015 19:42    EKG: Independently reviewed. Normal sinus rhythm with nonspecific ST-T changes.  Assessment/Plan Principal Problem:   Syncope and collapse Active Problems:   Hyperlipidemia with target LDL less than 100   CAD, NATIVE VESSEL   Type II diabetes mellitus with manifestations ([place])  LV dysfunction   Essential hypertension   Syncope   1. Syncope - cause not clear. I did discuss with on-call neurologist Dr. Janann Colonel who has advised to get an EEG since patient had confusion following the incident. Since patient also has cardiac history we will cycle cardiac markers and check 2-D echo. Closely monitor in telemetry for any arrhythmias. 2. Acute renal failure - gently hydrate and recheck metabolic panel. May have to hold lisinopril if creatinine does not improve. 3. Hypertension uncontrolled - see #2. I have placed patient on when necessary IV hydralazine. Closely follow blood pressure trends. 4. CAD status post stenting - denies any chest pain. Patient is on aspirin. 5. History of hemorrhagic CVA status post VP shunt placement - on aspirin. 6. History of recurrent DVT status post IVC placement not on antiplatelet patient secondary to hemorrhagic CVA.  I have reviewed patient's old charts and labs.   DVT Prophylaxis SCDs for nontender blood pressure controlled. Code Status: Full code.  Family Communication: Patient's wife.  Disposition Plan: Admit for observation.    Trinity Haun N. [facility] Pager [phone].  If 7PM-7AM, please contact night-coverage [email or web address] Password [email or web address] 11/29/2015, 1:23 AM

## 2015-11-29 NOTE — ED Notes (Signed)
Report given to rn on 5 w 

## 2015-11-29 NOTE — ED Notes (Signed)
Pt passed hi swallow screen

## 2015-11-29 NOTE — Progress Notes (Signed)
NURSING PROGRESS NOTE  Tony Davis.  MRN: [id number]  Admission Data: 11/29/2015 1:44 AM Attending Provider: Rise Patience, MD  PCP: [name], MD  Code status: FULL  Allergies:  Allergies  Allergen Reactions  . Iodine Anaphylaxis  . Amlodipine Swelling and Other (See Comments)    Foot edema     Past Medical History:  has a past medical history of GERD (gastroesophageal reflux disease); HLD (hyperlipidemia); HTN (hypertension); Hematuria (8/12); PFO (patent foramen ovale); CAD (coronary artery disease); DVT (deep venous thrombosis) ([place]) (2013); TIA (transient ischemic attack); Type II diabetes mellitus ([place]); Arthritis; Rheumatoid arthritis(714.0); Folate deficiency anemia; Carotid artery disease ([facility]); BPH (benign prostatic hyperplasia); Basal ganglia hemorrhage ([place]) (2015); Hydrocephalus (2015); Acute respiratory failure ([place]) (2015); Enterobacter cloacae pneumonia ([place]) (2015); S/P percutaneous endoscopic gastrostomy (PEG) tube placement ([place]) (2015); Protein calorie malnutrition ([place]); LV dysfunction; Noncompliance; Falls; Urinary incontinence; CVA (cerebral vascular accident) ([place]) (02/2010); CVA (cerebral vascular accident) ([place]) (06/2014); OSA on CPAP; and Incontinence of feces.   Past Surgical History:  has past surgical history that includes Knee arthroscopy (Bilateral, 2000's); TEE without cardioversion (03/21/2012); Coronary angioplasty with stent (2012); and left heart catheterization with coronary angiogram (N/A, 04/24/2013).   [name]. is a 54 y.o. male patient, arrived to floor in room [phone] via stretcher, transferred from ED. Patient alert and oriented X 3, disoriented to time. No acute distress noted. Denies pain.   Vital signs: Oral temperature 98.7 F (37.1 C), Blood pressure 157/86, Pulse 61, RR 18, SpO2 98 % on room air. Height 6' (182.9 cm), weight 248 lbs (112.492 kg).   Cardiac monitoring: Telemetry box 5W #  02 in place.  IV access: Left  hand; condition patent and no redness.  Skin: intact, no pressure ulcer noted in sacral area.   Patient's ID armband verified with patient/ family, and in place. Information packet given to patient/ family. Fall risk assessed, SR up X2, patient/ family able to verbalize understanding of risks associated with falls and to call nurse or staff to assist before getting out of bed. Patient/ family oriented to room and equipment. Call bell within reach.

## 2015-11-30 ENCOUNTER — Observation Stay (HOSPITAL_COMMUNITY): Payer: BC Managed Care – PPO

## 2015-11-30 DIAGNOSIS — E860 Dehydration: Secondary | ICD-10-CM | POA: Diagnosis not present

## 2015-11-30 DIAGNOSIS — R55 Syncope and collapse: Secondary | ICD-10-CM | POA: Diagnosis not present

## 2015-11-30 DIAGNOSIS — I10 Essential (primary) hypertension: Secondary | ICD-10-CM | POA: Diagnosis not present

## 2015-11-30 LAB — BASIC METABOLIC PANEL
Anion gap: 9 (ref 5–15)
BUN: 11 mg/dL (ref 6–20)
CALCIUM: 8.9 mg/dL (ref 8.9–10.3)
CO2: 22 mmol/L (ref 22–32)
Chloride: 108 mmol/L (ref 101–111)
Creatinine, Ser: 0.93 mg/dL (ref 0.61–1.24)
GFR calc Af Amer: >60 mL/min (ref >60)
Glucose, Bld: 96 mg/dL (ref 65–99)
Potassium: 3.2 mmol/L — ABNORMAL LOW (ref 3.5–5.1)
Sodium: 139 mmol/L (ref 135–145)

## 2015-11-30 LAB — MAGNESIUM: MAGNESIUM: 1.8 mg/dL (ref 1.7–2.4)

## 2015-11-30 LAB — TSH: TSH: 1.124 u[IU]/mL (ref 0.350–4.500)

## 2015-11-30 MED ORDER — FUROSEMIDE 40 MG PO TABS: 40.0000 mg | ORAL_TABLET | Freq: Every day | ORAL | Status: DC

## 2015-11-30 NOTE — Progress Notes (Signed)
Patient discharge teaching given, including activity, diet, follow-up appoints, and medications. Patient verbalized understanding of all discharge instructions. IV access was d/c'd. Vitals are stable. Skin is intact except as charted in most recent assessments. Pt to be escorted out by volunteer services, to be driven home by family.

## 2015-11-30 NOTE — Clinical Social Work Note (Signed)
CSW received referral for SNF.  Case discussed with case manager, and plan is to discharge home with home health.  CSW to sign off please re-consult if social work needs arise.  Safiyya Stokes R. Christal Lagerstrom, MSW, LCSWA [phone]  

## 2015-11-30 NOTE — Progress Notes (Signed)
PT Cancellation Note  Patient Details Name: Tony Davis. MRN: [id number] DOB: Sep 24, 1961   Cancelled Treatment:    Reason Eval/Treat Not Completed: Patient at procedure or test/unavailable.  Check back when pt is done with imaging.   Ramond Dial 11/30/2015, 9:44 AM   Mee Hives, PT MS Acute Rehab Dept. Number: [facility] [id number] and [place] [phone]

## 2015-11-30 NOTE — Care Management Note (Signed)
Case Management Note  Patient Details  Name: Tony Davis. MRN: [id number] Date of Birth: [birth year]  Subjective/Objective:  NCM spoke with wife, she states she is taking patient home, today is her last day of school and she will not be going back to work until jan 3rd, during this time she will be trying to get patient into an adult day care and she would like to be set up with [facility], [place], [place], aide and Education officer, museum, referral made to [place] with [place].  Soc will begin 24-48 hrs post dc.                   Action/Plan:   Expected Discharge Date:                  Expected Discharge Plan:  [place]  In-House Referral:     Discharge planning Services  CM Consult  Post Acute Care Choice:  Home Health Choice offered to:  Spouse  DME Arranged:    DME Agency:     HH Arranged:  RN, PT, OT, Nurse's Aide, Social Work [employer] Agency:  [place]  Status of Service:  Completed, signed off  Medicare Important Message Given:    Date Medicare IM Given:    Medicare IM give by:    Date Additional Medicare IM Given:    Additional Medicare Important Message give by:     If discussed at [place] of Stay Meetings, dates discussed:    Additional Comments:  Zenon Mayo, RN 11/30/2015, 12:36 PM

## 2015-11-30 NOTE — Progress Notes (Signed)
PT Cancellation Note  Patient Details Name: Tony Davis. MRN: [id number] DOB: 29-Oct-1961   Cancelled Treatment:    Reason Eval/Treat Not Completed: Patient at procedure or test/unavailable.  Still unable to get pt for visit to assess.   Ramond Dial 11/30/2015, 1:15 PM   Mee Hives, PT MS Acute Rehab Dept. Number: [facility] [id number] and [place] [phone]

## 2015-11-30 NOTE — Discharge Summary (Signed)
Physician Discharge Summary  Tony Davis. [id number] DOB: Mar 14, 1961 DOA: 11/28/2015  PCP: [name], MD  Admit date: 11/28/2015 Discharge date: 11/30/2015  Time spent: 35 minutes  Recommendations for Outpatient Follow-up:  1. Prior to discharge Tony Davis was set up with [facility] services for home PT, RN  and AIDE 2. He was admitted for syncope likely secondary to dehydration. Please follow-up of volume status as I decreased his Lasix dose to 40 mg by mouth daily from twice a day dosing. 3. Please follow-up on blood pressures   Discharge Diagnoses:  Principal Problem:   Syncope and collapse Active Problems:   Hyperlipidemia with target LDL less than 100   CAD, NATIVE VESSEL   Type II diabetes mellitus with manifestations ([place])   LV dysfunction   Essential hypertension   Syncope   Discharge Condition: Stable  Diet recommendation: Healthy  Filed Weights   11/29/15 0144  Weight: 112.492 kg (248 lb)    History of present illness:  Tony Newnham. is a 54 y.o. male with history of intracranial hemorrhage status post VP shunt placement with mild residual right-sided weakness, recurrent DVT status post IVC filter placement not on anticoagulations secondary to hemorrhagic CVA, CAD status post stenting with last cardiac cath in 2014 which was unremarkable, hypertension and hyperlipidemia was brought to the ER after patient was found to have transient loss of consciousness. Patient loss of consciousness happened on 11/27/2015 [place]. Patient transiently slumped down while before patient can raise the floor patient's family held him and placed him on a chair. Patient was appearing confused for a few minutes following which patient regained his full alertness. Has chronic incontinence of urine. Yesterday morning patient's family called their PCP and was instructed to come to the ER. CT of the head did not show anything acute.  Patient since his last hemorrhagic CVA has been mostly somnolent. On exam patient has mildly weak on the right side which per the family is chronic. And patient also mildly somnolent which as per the patient's family has been chronic. Patient did not have any nausea vomiting diarrhea chest pain or shortness of breath. Patient has been admitted for further observation for syncope.   Hospital Course:  Tony Davis is a pleasant 54 year old gentleman with a past medical history of intracranial hemorrhage status post VP shunt placement, history of diastolic congestive heart failure had been on Lasix therapy prior to hospitalization, admitted to the medicine service on 11/29/2015 after having a syncopal event witnessed by family members. He was placed on telemetry for syncope workup. CT scan of brain performed on 11/28/2015 did not reveal evidence of acute intracranial pathology. History partners were cycled and remained negative 3 sets. EEG was unremarkable as well without seizure or seizure predisposition recorded on study. Lab work did reveal creatinine of 1.35 that came down to 0.93 on day of discharge. Suspect this improvement reflected administration of IV fluids in the context of prerenal azotemia. He had been on Lasix 40 mg by mouth twice a day at home which likely precipitated dehydration. On discharge his Lasix dose was decreased to 40 mg by mouth daily as his volume status 20 to be followed closely in the outpatient setting. Family members reporting gradual functional decline. He was set up with home health services for home physical therapy, RN and aide prior to discharge. He was discharged was home in stable condition on 11/30/2015   Discharge Exam: Filed Vitals:  11/30/15 0524 11/30/15 0539  BP: 159/88 146/83  Pulse: 69 69  Temp: 97.9 F (36.6 C)   Resp: 17     General: Moderately built and nourished.  Neck: No mass felt. No neck rigidity.  Cardiovascular: S1-S2 heard.  Respiratory: No  rhonchi or crepitations.  Abdomen: Soft nontender bowel sounds present.  Musculoskeletal: No edema.  Psychiatric: Mildly somnolent.   Discharge Instructions   Discharge Instructions    (HEART FAILURE PATIENTS) Call MD:  Anytime you have any of the following symptoms: 1) 3 pound weight gain in 24 hours or 5 pounds in 1 week 2) shortness of breath, with or without a dry hacking cough 3) swelling in the hands, feet or stomach 4) if you have to sleep on extra pillows at night in order to breathe.    Complete by:  As directed      Call MD for:  difficulty breathing, headache or visual disturbances    Complete by:  As directed      Call MD for:  extreme fatigue    Complete by:  As directed      Call MD for:  hives    Complete by:  As directed      Call MD for:  persistant dizziness or light-headedness    Complete by:  As directed      Call MD for:  persistant nausea and vomiting    Complete by:  As directed      Call MD for:  redness, tenderness, or signs of infection (pain, swelling, redness, odor or green/yellow discharge around incision site)    Complete by:  As directed      Call MD for:  severe uncontrolled pain    Complete by:  As directed      Call MD for:  temperature >100.4    Complete by:  As directed      Call MD for:    Complete by:  As directed      Diet - low sodium heart healthy    Complete by:  As directed      Increase activity slowly    Complete by:  As directed           Current Discharge Medication List    CONTINUE these medications which have CHANGED   Details  furosemide (LASIX) 40 MG tablet Take 1 tablet (40 mg total) by mouth daily. Qty: 60 tablet, Refills: 6      CONTINUE these medications which have NOT CHANGED   Details  aspirin EC 81 MG tablet Take 81 mg by mouth daily.    escitalopram (LEXAPRO) 10 MG tablet Take 10 mg by mouth every morning.     lisinopril (PRINIVIL,ZESTRIL) 20 MG tablet Take 1 tablet (20 mg total) by mouth daily. Qty: 30  tablet, Refills: 6    metoprolol succinate (TOPROL-XL) 50 MG 24 hr tablet Take 1 and a half tablets by mouth twice a day. Take with or immediately following a meal. Qty: 90 tablet, Refills: 6    Potassium Chloride 40 MEQ/15ML (20%) SOLN TAKE 80 MEQ   (30 MILLILITERS) BY MOUTH 2 (TWO) TIMES DAILY. Qty: 1892 mL, Refills: 10    atorvastatin (LIPITOR) 80 MG tablet Take 1 tablet (80 mg total) by mouth every evening.       Allergies  Allergen Reactions  . Iodine Anaphylaxis  . Amlodipine Swelling and Other (See Comments)    Foot edema   Follow-up Information    [facility] with [facility]   Contact information:   [facility] ([phone]) [phone] [place] [phone]      Follow up with [facility].   Why:  HHRN, PT, OT, aide and social worker   Contact information:   [address] [place] [address] [phone]        The results of significant diagnostics from this hospitalization (including imaging, microbiology, ancillary and laboratory) are listed below for reference.    Significant Diagnostic Studies: Dg Chest 1 View  11/29/2015  CLINICAL DATA:  History of congestive heart failure EXAM: CHEST  1 VIEW COMPARISON:  09/09/2015 FINDINGS: The heart size and mediastinal contours are within normal limits. Both lungs are clear. The visualized skeletal structures are unremarkable. Ventricular shunt is noted. IMPRESSION: No active disease. Electronically Signed   By: Inez Catalina M.D.   On: 11/29/2015 18:23   Ct Head Wo Contrast  11/28/2015  CLINICAL DATA:  54 year old male with syncopal episode presenting with generalized weakness and confusion. EXAM: CT HEAD WITHOUT CONTRAST TECHNIQUE: Contiguous axial images were obtained from the base of the skull through the vertex without intravenous contrast. COMPARISON:  CT dated 09/09/2015 FINDINGS: Right frontal ventriculostomy shunt with tip in the frontal poles of the right  lateral ventricle in stable positioning. There is stable decompressed appearance of the right lateral ventricle. The left ventricle appears stable in size compared to the prior study. The third and fourth ventricles appear normal for patient's age and stable in size. The sulci are appropriate in size for patient's age. Mild periventricular and deep white matter hypodensities represent chronic microvascular ischemic changes. There is no intracranial hemorrhage. No mass effect or midline shift identified. The visualized paranasal sinuses and mastoid air cells are well aerated. The calvarium is intact. IMPRESSION: No acute intracranial pathology. Stable appearance of the right ventriculostomy shunt and ventricular size. Electronically Signed   By: Anner Crete M.D.   On: 11/28/2015 19:42    Microbiology: No results found for this or any previous visit (from the past 240 hour(s)).   Labs: Basic Metabolic Panel:  Recent Labs Lab 11/28/15 1802 11/28/15 1817 11/29/15 0740 11/30/15 0620  NA 142 144 140 139  K 3.7 3.4* 3.0* 3.2*  CL 107 104 105 108  CO2 24  --  26 22  GLUCOSE 130* 129* 122* 96  BUN 12 15 10 11   CREATININE 1.35* 1.20 0.97 0.93  CALCIUM 9.3  --  8.9 8.9  MG  --   --   --  1.8   Liver Function Tests:  Recent Labs Lab 11/28/15 1802 11/29/15 0740  AST 20 13*  ALT 14* 14*  ALKPHOS 112 104  BILITOT 0.9 0.7  PROT 7.0 6.4*  ALBUMIN 4.0 3.5   No results for input(s): LIPASE, AMYLASE in the last 168 hours. No results for input(s): AMMONIA in the last 168 hours. CBC:  Recent Labs Lab 11/28/15 1802 11/28/15 1817 11/29/15 0740  WBC 6.7  --  6.2  NEUTROABS 4.1  --  3.3  HGB 15.8 17.0 13.9  HCT 48.0 50.0 42.9  MCV 93.0  --  92.5  PLT 262  --  251   Cardiac Enzymes:  Recent Labs Lab 11/29/15 0310 11/29/15 0740 11/29/15 1620  TROPONINI <0.03 <0.03 <0.03   BNP: BNP (last 3 results)  Recent Labs  11/30/14 1630  BNP 25.8    ProBNP (last 3 results) No  results for input(s): PROBNP in the last 8760 hours.  CBG:  Recent Labs Lab 11/28/15 2331  GLUCAP 87       Signed:  Kelvin Cellar MD  FACP  [facility] 11/30/2015, 12:53 PM

## 2015-12-01 LAB — HEMOGLOBIN A1C
Hgb A1c MFr Bld: 5.7 % — ABNORMAL HIGH (ref 4.8–5.6)
Mean Plasma Glucose: 117 mg/dL

## 2015-12-06 ENCOUNTER — Telehealth: Payer: Self-pay | Admitting: Internal Medicine

## 2015-12-06 NOTE — Telephone Encounter (Signed)
2 week 1 week 3 verbal given

## 2015-12-06 NOTE — Telephone Encounter (Signed)
Tony Davis is looking for verbals on skilled nursing svcs - 2 week 1 and then 1 week 8

## 2015-12-11 ENCOUNTER — Encounter: Payer: Self-pay | Admitting: Cardiovascular Disease

## 2015-12-11 ENCOUNTER — Encounter: Payer: Self-pay | Admitting: Internal Medicine

## 2015-12-18 ENCOUNTER — Other Ambulatory Visit: Payer: Self-pay | Admitting: Cardiovascular Disease

## 2015-12-19 ENCOUNTER — Other Ambulatory Visit: Payer: Self-pay | Admitting: Cardiovascular Disease

## 2015-12-23 ENCOUNTER — Encounter: Payer: Self-pay | Admitting: Internal Medicine

## 2015-12-26 ENCOUNTER — Ambulatory Visit (INDEPENDENT_AMBULATORY_CARE_PROVIDER_SITE_OTHER): Payer: Commercial Managed Care - HMO | Admitting: Internal Medicine

## 2015-12-26 ENCOUNTER — Encounter: Payer: Self-pay | Admitting: Internal Medicine

## 2015-12-26 ENCOUNTER — Other Ambulatory Visit (INDEPENDENT_AMBULATORY_CARE_PROVIDER_SITE_OTHER): Payer: Commercial Managed Care - HMO

## 2015-12-26 VITALS — BP 136/86 | HR 66 | Temp 98.3°F | Resp 20 | Ht 71.0 in | Wt 259.8 lb

## 2015-12-26 DIAGNOSIS — G4733 Obstructive sleep apnea (adult) (pediatric): Secondary | ICD-10-CM | POA: Diagnosis not present

## 2015-12-26 DIAGNOSIS — Z8673 Personal history of transient ischemic attack (TIA), and cerebral infarction without residual deficits: Secondary | ICD-10-CM

## 2015-12-26 DIAGNOSIS — Z23 Encounter for immunization: Secondary | ICD-10-CM | POA: Diagnosis not present

## 2015-12-26 DIAGNOSIS — E876 Hypokalemia: Secondary | ICD-10-CM | POA: Diagnosis not present

## 2015-12-26 DIAGNOSIS — R5383 Other fatigue: Secondary | ICD-10-CM | POA: Diagnosis not present

## 2015-12-26 DIAGNOSIS — I5032 Chronic diastolic (congestive) heart failure: Secondary | ICD-10-CM

## 2015-12-26 LAB — BASIC METABOLIC PANEL
BUN: 13 mg/dL (ref 6–23)
CALCIUM: 10 mg/dL (ref 8.4–10.5)
CO2: 26 meq/L (ref 19–32)
CREATININE: 0.99 mg/dL (ref 0.40–1.50)
Chloride: 101 mEq/L (ref 96–112)
GFR: 102.06 mL/min (ref >60.00)
Glucose, Bld: 96 mg/dL (ref 70–99)
Potassium: 3.3 mEq/L — ABNORMAL LOW (ref 3.5–5.1)
Sodium: 141 mEq/L (ref 135–145)

## 2015-12-26 LAB — CBC WITH DIFFERENTIAL/PLATELET
BASOS ABS: 0 10*3/uL (ref 0.0–0.1)
Basophils Relative: 0.5 % (ref 0.0–3.0)
EOS PCT: 3.3 % (ref 0.0–5.0)
Eosinophils Absolute: 0.3 10*3/uL (ref 0.0–0.7)
HEMATOCRIT: 46.3 % (ref 39.0–52.0)
Hemoglobin: 15.3 g/dL (ref 13.0–17.0)
LYMPHS PCT: 20.9 % (ref 12.0–46.0)
Lymphs Abs: 1.7 10*3/uL (ref 0.7–4.0)
MCHC: 33 g/dL (ref 30.0–36.0)
MCV: 91.7 fl (ref 78.0–100.0)
MONOS PCT: 4.1 % (ref 3.0–12.0)
Monocytes Absolute: 0.3 10*3/uL (ref 0.1–1.0)
Neutro Abs: 5.7 10*3/uL (ref 1.4–7.7)
Neutrophils Relative %: 71.2 % (ref 43.0–77.0)
Platelets: 336 10*3/uL (ref 150.0–400.0)
RBC: 5.05 Mil/uL (ref 4.22–5.81)
RDW: 13.1 % (ref 11.5–15.5)
WBC: 7.9 10*3/uL (ref 4.0–10.5)

## 2015-12-26 LAB — URINALYSIS, ROUTINE W REFLEX MICROSCOPIC
BILIRUBIN URINE: NEGATIVE
KETONES UR: NEGATIVE
NITRITE: NEGATIVE
Specific Gravity, Urine: 1.025 (ref 1.000–1.030)
Total Protein, Urine: 30 — AB
URINE GLUCOSE: NEGATIVE
Urobilinogen, UA: 0.2 (ref 0.0–1.0)
pH: 6 (ref 5.0–8.0)

## 2015-12-26 LAB — HEPATIC FUNCTION PANEL
ALBUMIN: 4.5 g/dL (ref 3.5–5.2)
ALT: 13 U/L (ref 0–53)
AST: 14 U/L (ref 0–37)
Alkaline Phosphatase: 111 U/L (ref 39–117)
BILIRUBIN DIRECT: 0.1 mg/dL (ref 0.0–0.3)
TOTAL PROTEIN: 8 g/dL (ref 6.0–8.3)
Total Bilirubin: 0.5 mg/dL (ref 0.2–1.2)

## 2015-12-26 LAB — TSH: TSH: 1.23 u[IU]/mL (ref 0.35–4.50)

## 2015-12-26 MED ORDER — FUROSEMIDE 20 MG PO TABS: 20.0000 mg | ORAL_TABLET | Freq: Every day | ORAL | Status: DC

## 2015-12-26 MED ORDER — ATORVASTATIN CALCIUM 80 MG PO TABS: 80.0000 mg | ORAL_TABLET | Freq: Every evening | ORAL | Status: DC

## 2015-12-26 NOTE — Progress Notes (Signed)
Pre visit review using our clinic review tool, if applicable. No additional management support is needed unless otherwise documented below in the visit note. 

## 2015-12-26 NOTE — Progress Notes (Signed)
Subjective:    Patient ID: Tony Davis., male    DOB: 11/01/1961, 55 y.o.   MRN: [id number]  HPI  Here to f/u with family, sleepy and hard to wake for exam, family states this is the usual for him, very common most days, has OSA but not using CPAP, does not have regular f/u for this.  Pt denies chest pain, increased sob or doe, wheezing, orthopnea, PND, increased LE swelling, palpitations, dizziness or syncope, but wt is increased after family was confused about d/c instructions listing lasix 40 qd but thought it was supposed to be bid, so has not had further lasix since hosp.  Wt increased. Wt Readings from Last 3 Encounters:  12/26/15 259 lb 12 oz (117.822 kg)  11/29/15 248 lb (112.492 kg)  08/01/15 265 lb 11.2 oz (120.521 kg)  Most recent echo dec 2015 with EF 65-70%, gr 2 diast dysfxn.  Last bmp with mild low K.  Has been getting HH with PT, OT and family asks for ST as well.  Gate remains "wobbly" but no falls. No new complaints.   Pt denies fever, wt loss, night sweats, loss of appetite, or other constitutional symptoms Past Medical History  Diagnosis Date  . GERD (gastroesophageal reflux disease)   . HLD (hyperlipidemia)   . HTN (hypertension)   . Hematuria 8/12  . PFO (patent foramen ovale)     a. echo 8/12: EF 55-65%, grade 2 diast dysfnx; +PFO on bubble study. b. TEE 4/13: EF normal, atrial septum with suspicion for interatrial septum fenestrations without flow across and few large bubbles noted in LA.  This was not felt to require closure   . CAD (coronary artery disease)     a. s/p promus DES circumflex artery 10/13/10. b. Cath 04/2013: stable disease but possible small vessel disease -Imdur added.  Marland Kitchen DVT (deep venous thrombosis) ([place]) 2013    a. 2013. b. 2015 - reported BLE DVT s/p IVC filter.   Marland Kitchen TIA (transient ischemic attack)     "1-2; both after the one in 02/2010" (09/25/2013)  . Type II diabetes mellitus ([place])   . Arthritis     "knees" (09/25/2013)  . Rheumatoid  arthritis(714.0)   . Folate deficiency anemia   . Carotid artery disease ([place])     a. 1-29% BICA by study 04/2014.  Marland Kitchen BPH (benign prostatic hyperplasia)   . Basal ganglia hemorrhage ([place]) 2015    a. 2015 - with intraventricular extension and hydrocephalus requiring right crani and VP shunt, complicated by encephalopathy, protein calorie malnutrition, acute resp failure req trache and PEG, BLE DVT s/p IVC, enterobacter PNA, subsequent right hemiparesis and difficulty communicating.  Marland Kitchen Hydrocephalus 2015    a. 2/2 BG hemorrhage.  . Acute respiratory failure ([place]) 2015    a. Following BG hemorrhage - s/p trache with decannulation.  . Enterobacter cloacae pneumonia ([place]) 2015  . S/P percutaneous endoscopic gastrostomy (PEG) tube placement ([place]) 2015  . Protein calorie malnutrition ([place])   . LV dysfunction     a. 2011 - EF 45-50%, subsequent 55-60% in 04/2014.  Marland Kitchen Noncompliance   . Falls   . Urinary incontinence   . CVA (cerebral vascular accident) ([place]) 02/2010    a. CVA 2011, prior reported TIAs. b. CNS hemorrhage 2015.  Marland Kitchen CVA (cerebral vascular accident) ([place]) 06/2014    "hemorrhagic; in [place]; brain activity diminshed; ability to do anything; can't do ADLs on own"  . OSA on CPAP     "not  using CPAP right now" (11/30/2014)  . Incontinence of feces    Past Surgical History  Procedure Laterality Date  . Knee arthroscopy Bilateral 2000's    2 on left and 3 on rt  . Tee without cardioversion  03/21/2012    Procedure: TRANSESOPHAGEAL ECHOCARDIOGRAM (TEE);  Surgeon: Fay Records, MD;  Location[facility] ENDOSCOPY;  Service: Cardiovascular;  Laterality: N/A;  . Coronary angioplasty with stent placement  2012    "1" (09/24/2013)  . Left heart catheterization with coronary angiogram N/A 04/24/2013    Procedure: LEFT HEART CATHETERIZATION WITH CORONARY ANGIOGRAM;  Surgeon: Burnell Blanks, MD;  Location: [facility] CATH LAB;  Service: Cardiovascular;  Laterality: N/A;    reports that he quit smoking  about 3 years ago. His smoking use included Cigarettes. He has a 35 pack-year smoking history. He has never used smokeless tobacco. He reports that he does not drink alcohol or use illicit drugs. family history includes Heart attack in his father and sister; Hypertension in his brother; Seizures in his sister; Stroke in his father and sister. There is no history of Cancer, Kidney disease, or Diabetes. Allergies  Allergen Reactions  . Iodine Anaphylaxis  . Amlodipine Swelling and Other (See Comments)    Foot edema   Current Outpatient Prescriptions on File Prior to Visit  Medication Sig Dispense Refill  . aspirin EC 81 MG tablet Take 81 mg by mouth daily.    Marland Kitchen escitalopram (LEXAPRO) 10 MG tablet Take 10 mg by mouth every morning.     Marland Kitchen lisinopril (PRINIVIL,ZESTRIL) 20 MG tablet Take 1 tablet (20 mg total) by mouth daily. 30 tablet 6  . lisinopril (PRINIVIL,ZESTRIL) 20 MG tablet TAKE 1 TABLET BY MOUTH DAILY 90 tablet 0  . metoprolol succinate (TOPROL-XL) 50 MG 24 hr tablet Take 1 and a half tablets by mouth twice a day. Take with or immediately following a meal. (Patient taking differently: Take 75 mg by mouth daily. Take 1 and a half tablets by mouth twice a day. Take with or immediately following a meal.) 90 tablet 6  . Potassium Chloride 40 MEQ/15ML (20%) SOLN TAKE 80 MEQ   (30 MILLILITERS) BY MOUTH 2 (TWO) TIMES DAILY. 1892 mL 10   No current facility-administered medications on file prior to visit.   Review of Systems  Constitutional: Negative for unusual diaphoresis or night sweats HENT: Negative for ringing in ear or discharge Eyes: Negative for double vision or worsening visual disturbance.  Respiratory: Negative for choking and stridor.   Gastrointestinal: Negative for vomiting or other signifcant bowel change Genitourinary: Negative for hematuria or change in urine volume.  Musculoskeletal: Negative for other MSK pain or swelling Skin: Negative for color change and worsening wound.   Neurological: Negative for tremors and numbness other than noted  Psychiatric/Behavioral: Negative for decreased concentration or agitation other than above       Objective:   Physical Exam  BP 136/86 mmHg  Pulse 66  Temp(Src) 98.3 F (36.8 C) (Oral)  Resp 20  Ht 5\' 11"  (1.803 m)  Wt 259 lb 12 oz (117.822 kg)  BMI 36.24 kg/m2  SpO2 97% VS noted, walks with cane, slow and somnolent Constitutional: Pt appears in no significant distress HENT: Head: NCAT.  Right Ear: External ear normal.  Left Ear: External ear normal.  Eyes: . Pupils are equal, round, and reactive to light. Conjunctivae and EOM are normal Neck: Normal range of motion. Neck supple.  Cardiovascular: Normal rate and regular rhythm.   Pulmonary/Chest: Effort  normal and breath sounds without rales or wheezing.  Abd:  Soft, NT, ND, + BS Neurological: Pt is alert. Not confused , motor grossly intact Skin: Skin is warm. No rash, no LE edema Psychiatric: Pt behavior is normal. No agitation.     Assessment & Plan:

## 2015-12-26 NOTE — Assessment & Plan Note (Signed)
Noncompliant with CPAP, due for f/u - will refer

## 2015-12-26 NOTE — Assessment & Plan Note (Signed)
Ok to add ST to [facility] ,  to f/u any worsening symptoms or concerns

## 2015-12-26 NOTE — Assessment & Plan Note (Signed)
Etiology unclear, Exam otherwise benign, to check labs as documented, follow with expectant management  

## 2015-12-26 NOTE — Assessment & Plan Note (Addendum)
Ok to re-start lasix at 20 qd, cont to monitor home wt, f/u with PCP 3 wks  Note:  Total time for pt hx, exam, review of record with pt in the room, determination of diagnoses and plan for further eval and tx is > 40 min, with over 50% spent in coordination and counseling of patient

## 2015-12-26 NOTE — Patient Instructions (Signed)
You had the flu shot today  OK to restart the lasix at 20 mg per day  Please continue all other medications as before, and refills have been done if requested.  Please have the pharmacy call with any other refills you may need.  Please continue your efforts at being more active, low cholesterol diet, and weight control.  You are otherwise up to date with prevention measures today.  Please keep your appointments with your specialists as you may have planned  You will be contacted regarding the referral for: Pulmonary for the sleep apnea, and home Speech Therapy through [facility]  Please go to the LAB in the Basement (turn left off the elevator) for the tests to be done today  You will be contacted by phone if any changes need to be made immediately.  Otherwise, you will receive a letter about your results with an explanation, but please check with MyChart first.  Please remember to sign up for MyChart if you have not done so, as this will be important to you in the future with finding out test results, communicating by private email, and scheduling acute appointments online when needed.  Please return in 3 weeks, or sooner if needed, to Dr [name]

## 2015-12-26 NOTE — Assessment & Plan Note (Signed)
For f/u today, has been off lasix, but not taking K as well.

## 2015-12-27 ENCOUNTER — Other Ambulatory Visit: Payer: Self-pay | Admitting: Internal Medicine

## 2015-12-27 MED ORDER — POTASSIUM CHLORIDE ER 10 MEQ PO TBCR: 10.0000 meq | EXTENDED_RELEASE_TABLET | Freq: Every day | ORAL | Status: DC

## 2015-12-29 ENCOUNTER — Telehealth: Payer: Self-pay | Admitting: Internal Medicine

## 2015-12-29 NOTE — Telephone Encounter (Signed)
Tony Davis is calling for verbakls on OT for speech therapy eval. Speech therapist will call us back with a frequency. Targeting cognition

## 2015-12-30 NOTE — Telephone Encounter (Signed)
Verbal given 

## 2016-01-18 ENCOUNTER — Encounter: Payer: Self-pay | Admitting: Neurology

## 2016-02-15 ENCOUNTER — Encounter: Payer: Self-pay | Admitting: Internal Medicine

## 2016-02-28 ENCOUNTER — Encounter: Payer: Self-pay | Admitting: Internal Medicine

## 2016-03-05 ENCOUNTER — Encounter: Payer: Self-pay | Admitting: Cardiovascular Disease

## 2016-03-06 ENCOUNTER — Telehealth: Payer: Self-pay

## 2016-03-06 NOTE — Telephone Encounter (Signed)
Received e-mail asking for lab slip for patient to have K+ drawn as patient was taking too much potassium.I called house to get more information and got voicemail. Requested they call back.

## 2016-03-07 ENCOUNTER — Telehealth: Payer: Self-pay | Admitting: Cardiovascular Disease

## 2016-03-07 NOTE — Telephone Encounter (Signed)
New message      Returning a call to the nurse.  Wife want Tony Davis to have a blood test to see if he has too much potassium in his system.  Tony Davis was out of calcium pills.  Wife went to drug store to get presc.  Without looking at the bottle, she thought it was calcium and began giving it to the patient.  He was taking in the am---liquid form of potassium and in the pm he was taking liquid form of potassium and a potassium pill (thinking it was calcium pill).  This was going on for 2 months---since last potassium refill.  She finally looked at the bottle and discovered it was potassium not calcium.  Now she want a blood test to see how much potassium is in his system.  Please call

## 2016-03-07 NOTE — Telephone Encounter (Signed)
I spoke with pt's wife and she states she received my chart message about need for office visit.  I explained to pt's wife the reason for this was to go over medications and determine if anything was needed in addition to lab work.  She is agreeable to contacting primary care for appt.

## 2016-03-07 NOTE — Telephone Encounter (Signed)
Per my chart message sent to primary care on 02/28/16 regarding wrong potassium dosing it was stated by Dr. [name] he needed an office visit for this.  (see my chart message).  Potassium has most recently been adjusted by primary care.  I returned call to pt's wife to let her know she would need to schedule office visit with primary care.  Left message to call back.

## 2016-03-17 ENCOUNTER — Other Ambulatory Visit: Payer: Self-pay | Admitting: Internal Medicine

## 2016-04-04 ENCOUNTER — Other Ambulatory Visit: Payer: Self-pay | Admitting: Physician Assistant

## 2016-04-04 ENCOUNTER — Other Ambulatory Visit: Payer: Self-pay | Admitting: Cardiovascular Disease

## 2016-04-14 ENCOUNTER — Other Ambulatory Visit: Payer: Self-pay | Admitting: Cardiovascular Disease

## 2016-04-16 ENCOUNTER — Telehealth: Payer: Self-pay | Admitting: *Deleted

## 2016-04-16 NOTE — Telephone Encounter (Signed)
Per pharmacist at [facility] the metoprolol requires a prior authorization. The number provided to call is [phone].

## 2016-04-17 ENCOUNTER — Telehealth: Payer: Self-pay

## 2016-04-17 NOTE — Telephone Encounter (Signed)
Called [employer] [facility] for this PA. Will have a determination in 24-72 hours. Pharmacy notified.

## 2016-04-17 NOTE — Telephone Encounter (Signed)
Called [employer] for new PA for Metoprolol 50mg  1 1/2 tabs bid. Will have a determination in 24 -72 hours.

## 2016-04-19 ENCOUNTER — Telehealth: Payer: Self-pay

## 2016-04-19 NOTE — Telephone Encounter (Signed)
Approval for Metoprolol ER 50 mg 1 1/2 tabs bid through 04/17/2017. Local  Pharmacy notified.

## 2016-05-03 ENCOUNTER — Other Ambulatory Visit: Payer: Self-pay | Admitting: Internal Medicine

## 2016-05-09 IMAGING — DX DG PELVIS 1-2V
1 series · 1 of 1 positions shown · non-contrast
Comparison: None

CLINICAL DATA: Fell today, RIGHT and LEFT leg pain

EXAM:
PELVIS - 1-2 VIEW

[pelvis ap]
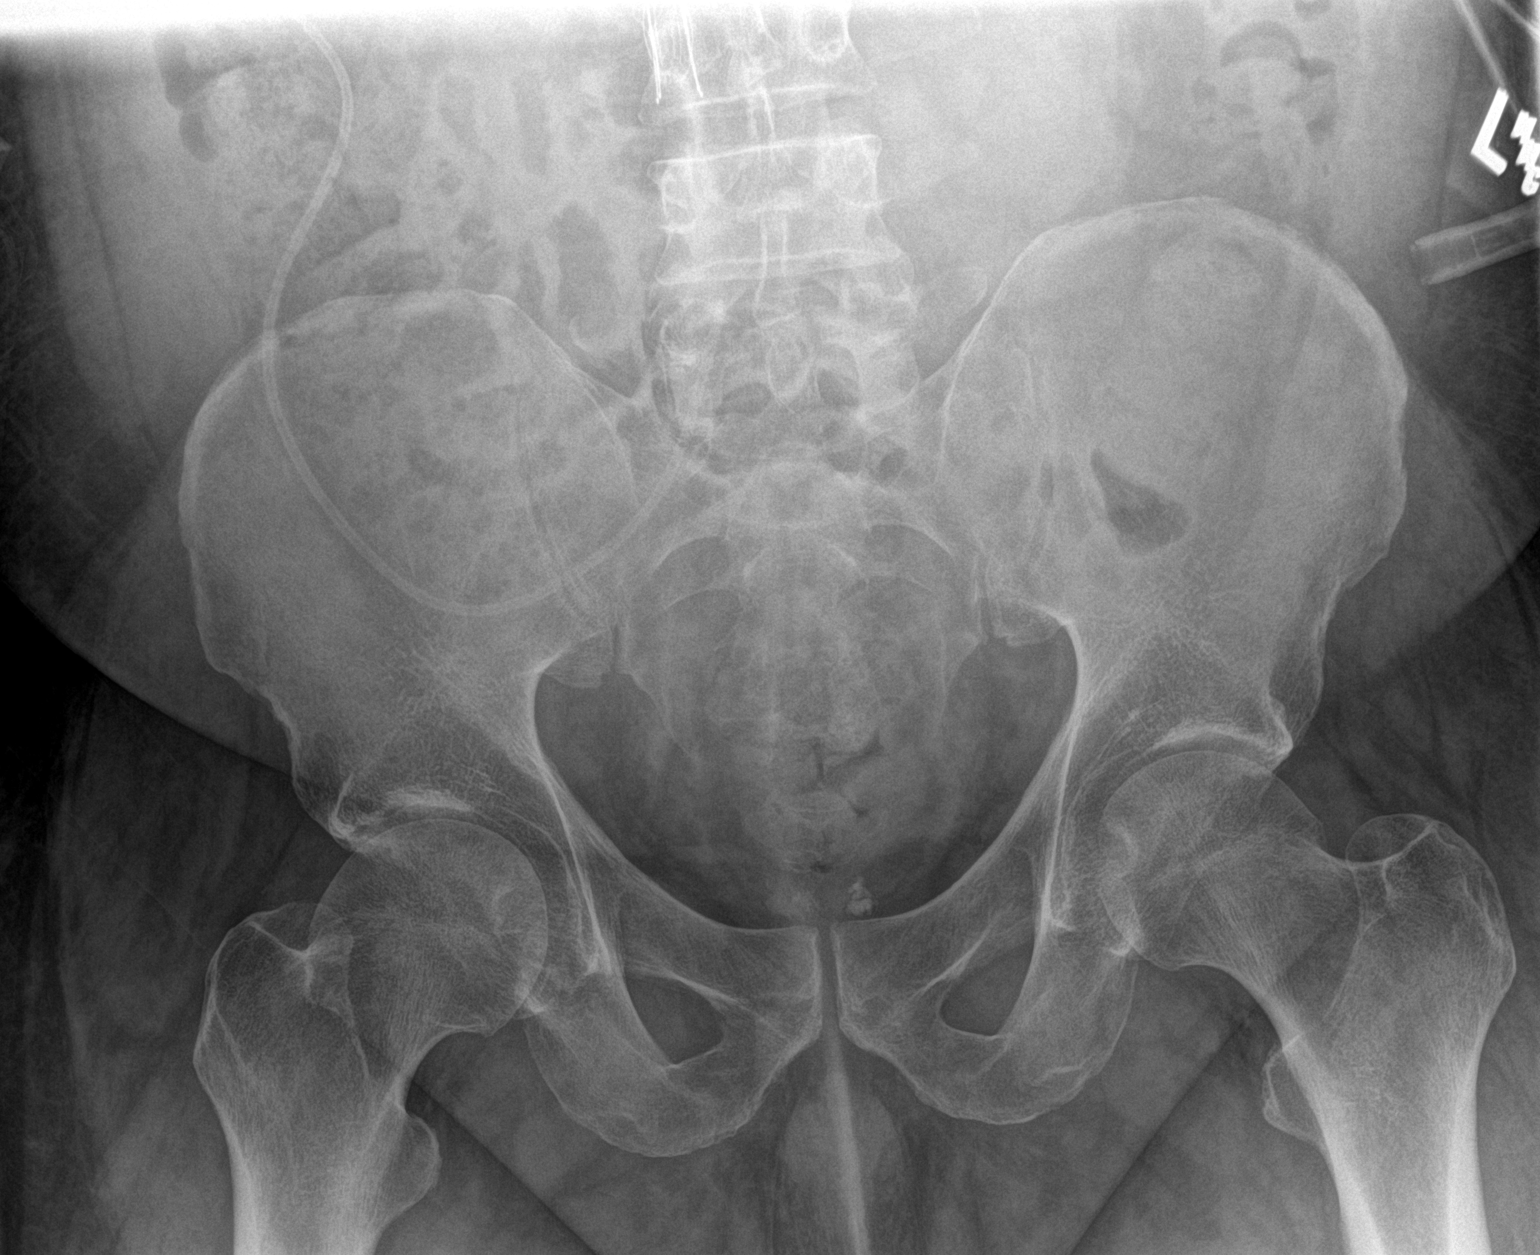

[1 of 1 positions shown; findings below may reference images not displayed]

FINDINGS: VP shunt tubing noted in RIGHT abdomen and pelvis.

Bones demineralized.

Hip and SI joint spaces preserved.

No acute fracture, dislocation or bone destruction.
IMPRESSION: No acute osseous abnormalities.

## 2016-05-09 IMAGING — CT CT HEAD W/O CM
4 of 6 series · 19 of 47 positions shown, 22 images · non-contrast
Comparison: CT head 07/12/2014

CLINICAL DATA: Unwitnessed fall at home today, found on floor at
8544 hr, history stroke, hypertension, coronary artery disease, TIA,
type 2 diabetes

EXAM:
CT HEAD WITHOUT CONTRAST
CT CERVICAL SPINE WITHOUT CONTRAST
TECHNIQUE: Multidetector CT imaging of the head and cervical spine was
performed following the standard protocol without intravenous
contrast. Multiplanar CT image reconstructions of the cervical spine
were also generated.

[Series 202: head w/o bone, idose (1) · axial · non-contrast · 0.49mm/px · z∈[+290,+385]mm · 4 of 64 slices shown]
[im 13/64  bone]
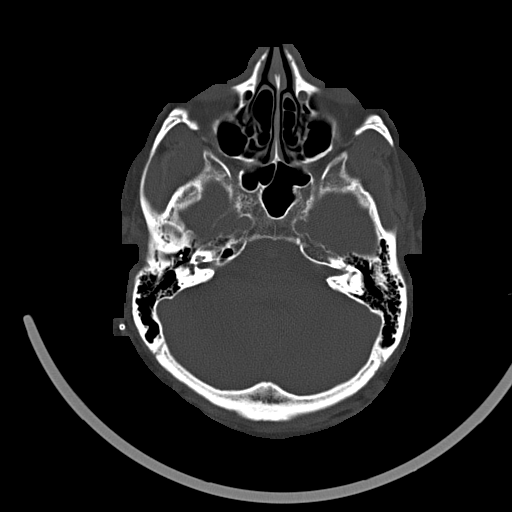
[im 26/64  bone]
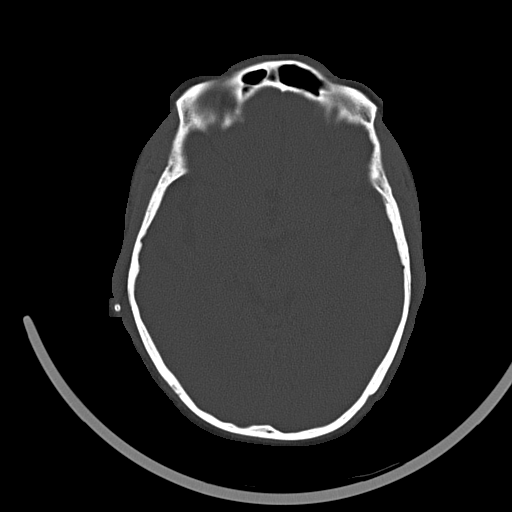
[im 38/64  bone]
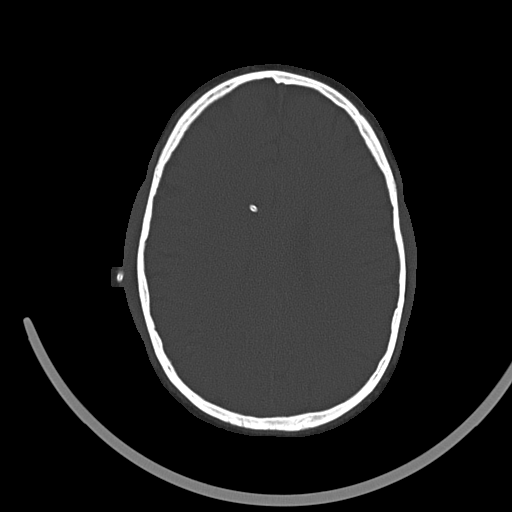
[im 51/64  bone]
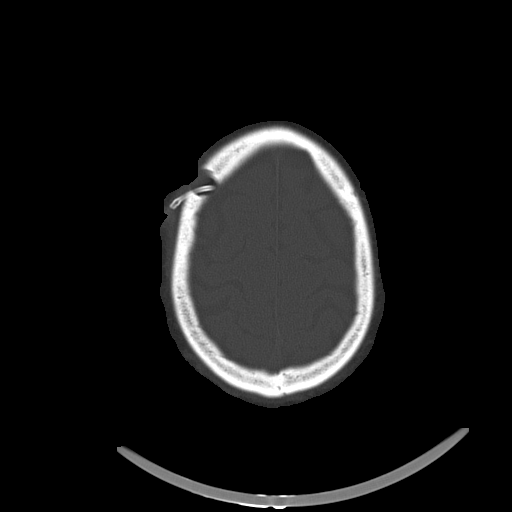

[Series 303: sagittal, idose (2) · sagittal · 0.35mm/px · 3 of 62 slices shown]
[im 21/62  brain]
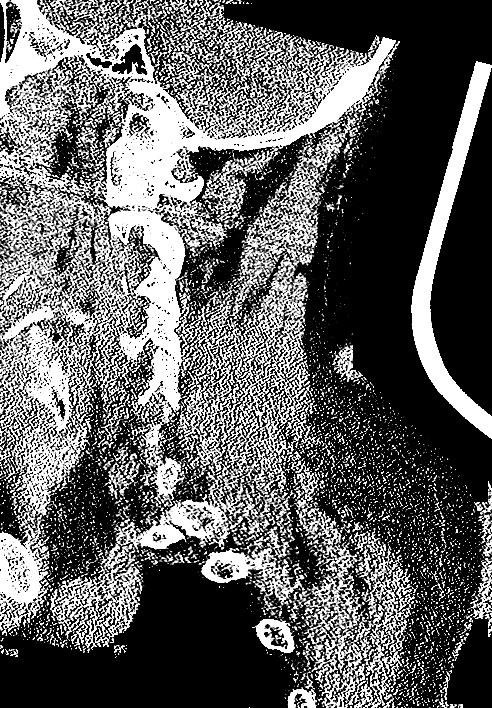
[im 31/62  brain]
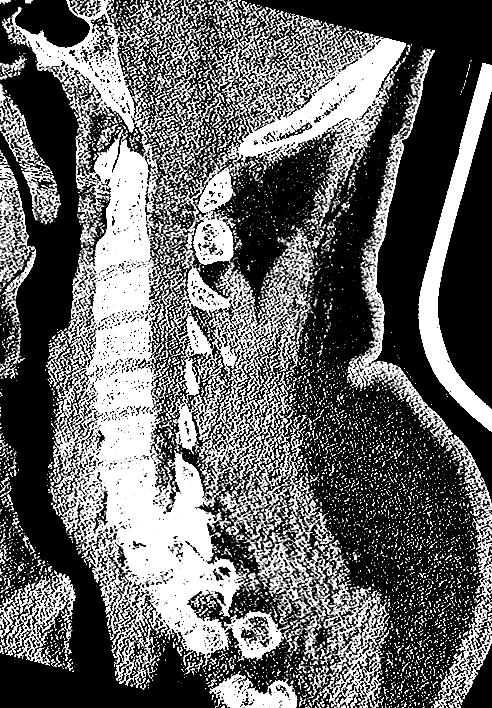
[im 41/62  brain]
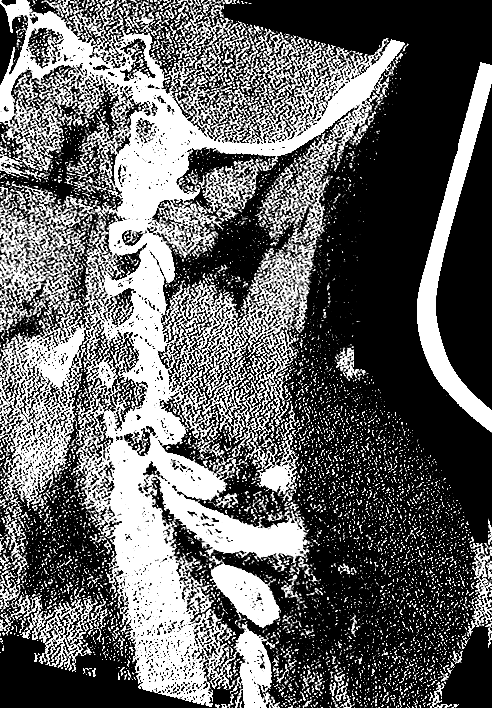

[Series 304: coronal, idose (2) · coronal · 0.35mm/px · 3 of 85 slices shown]
[im 29/85  brain]
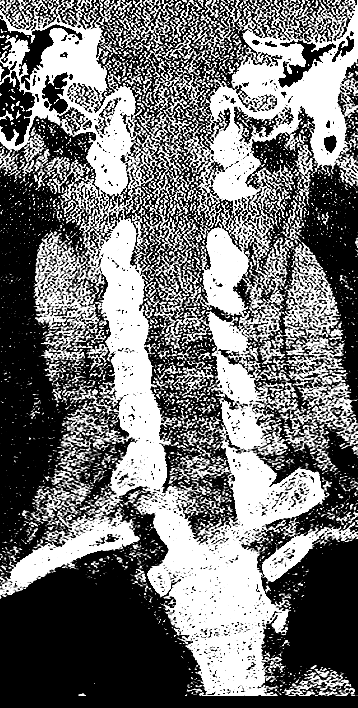
[im 38/85  brain]
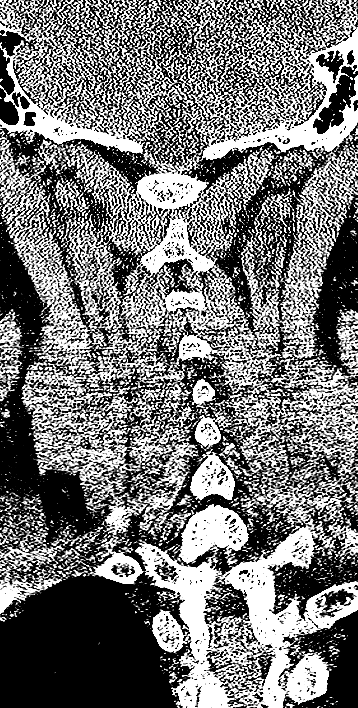
[im 47/85  brain]
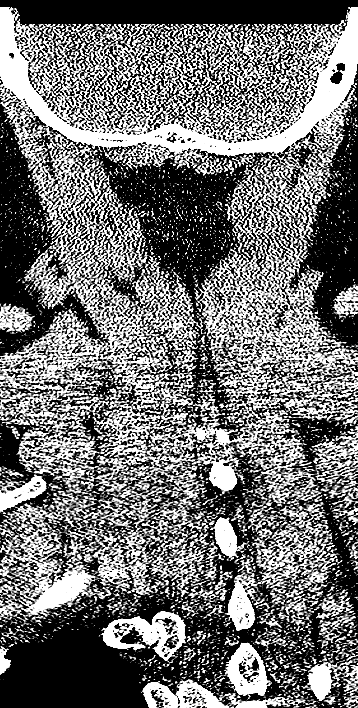

[Series 305: orthogonals, idose (2) · axial · 0.44mm/px · z∈[+83,+269]mm · 9 of 122 slices shown, 12 images]
[im 13/122  brain]
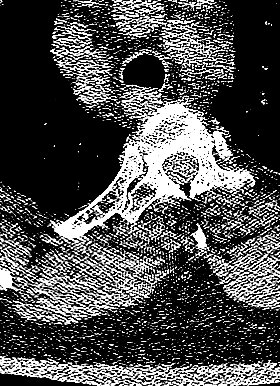
[im 13/122  bone]
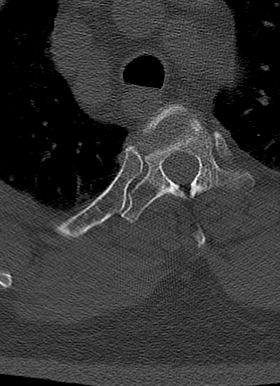
[im 25/122  brain]
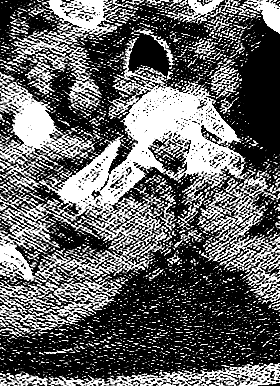
[im 37/122  brain]
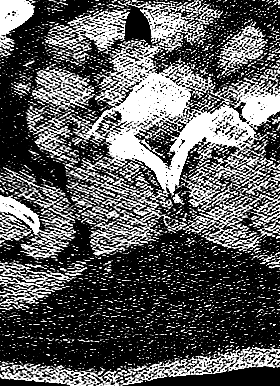
[im 49/122  brain]
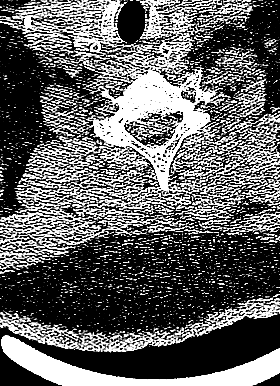
[im 61/122  brain]
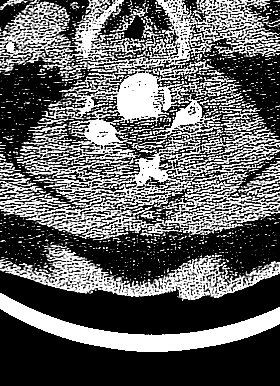
[im 61/122  bone]
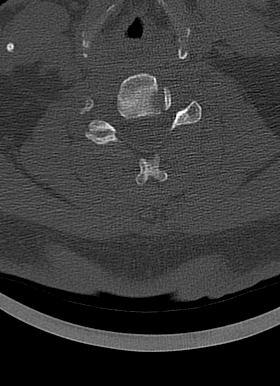
[im 73/122  brain]
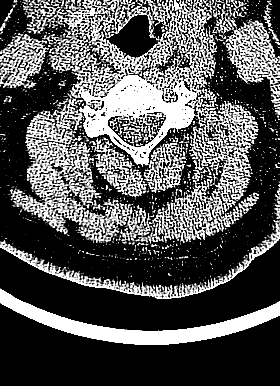
[im 85/122  brain]
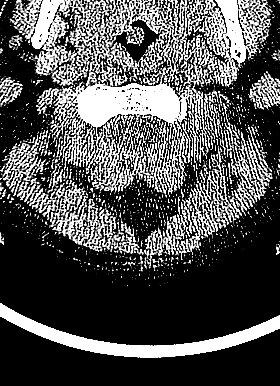
[im 97/122  brain]
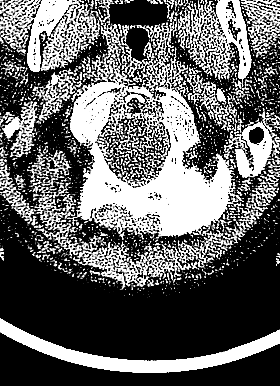
[im 109/122  brain]
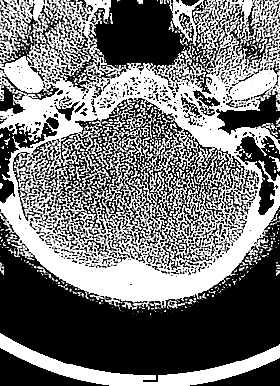
[im 109/122  bone]
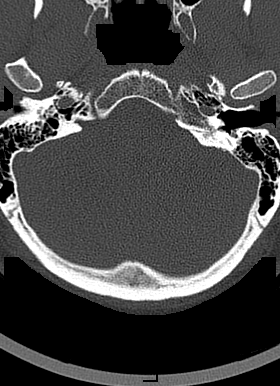

[19 of 47 positions shown; findings below may reference images not displayed]

FINDINGS: CT HEAD FINDINGS

VP shunt tubing via RIGHT frontal approach with tip in RIGHT lateral
ventricle at [facility] [place].

Normal ventricular morphology unchanged from previous study.

No midline shift or mass effect.

Otherwise normal appearance of brain parenchyma.

No intracranial hemorrhage, mass lesion or evidence acute
infarction.

No extra-axial fluid collections.

Bones and sinuses unremarkable.

CT CERVICAL SPINE FINDINGS

Visualized skullbase intact.

Prevertebral soft tissues normal thickness.

Minimal scattered disc space narrowing.

Vertebral body heights maintained.

No acute fracture, subluxation or bone destruction.

Lung apices clear.
IMPRESSION: Stable VP shunt.

No acute intracranial abnormalities.

No acute cervical spine abnormalities.

## 2016-05-09 IMAGING — DX DG CHEST 1V
1 series · 1 of 1 positions shown · non-contrast
Comparison: 07/24/2014

CLINICAL DATA: Altered mental status, fell today

EXAM:
CHEST - 1 VIEW

[chest ap]
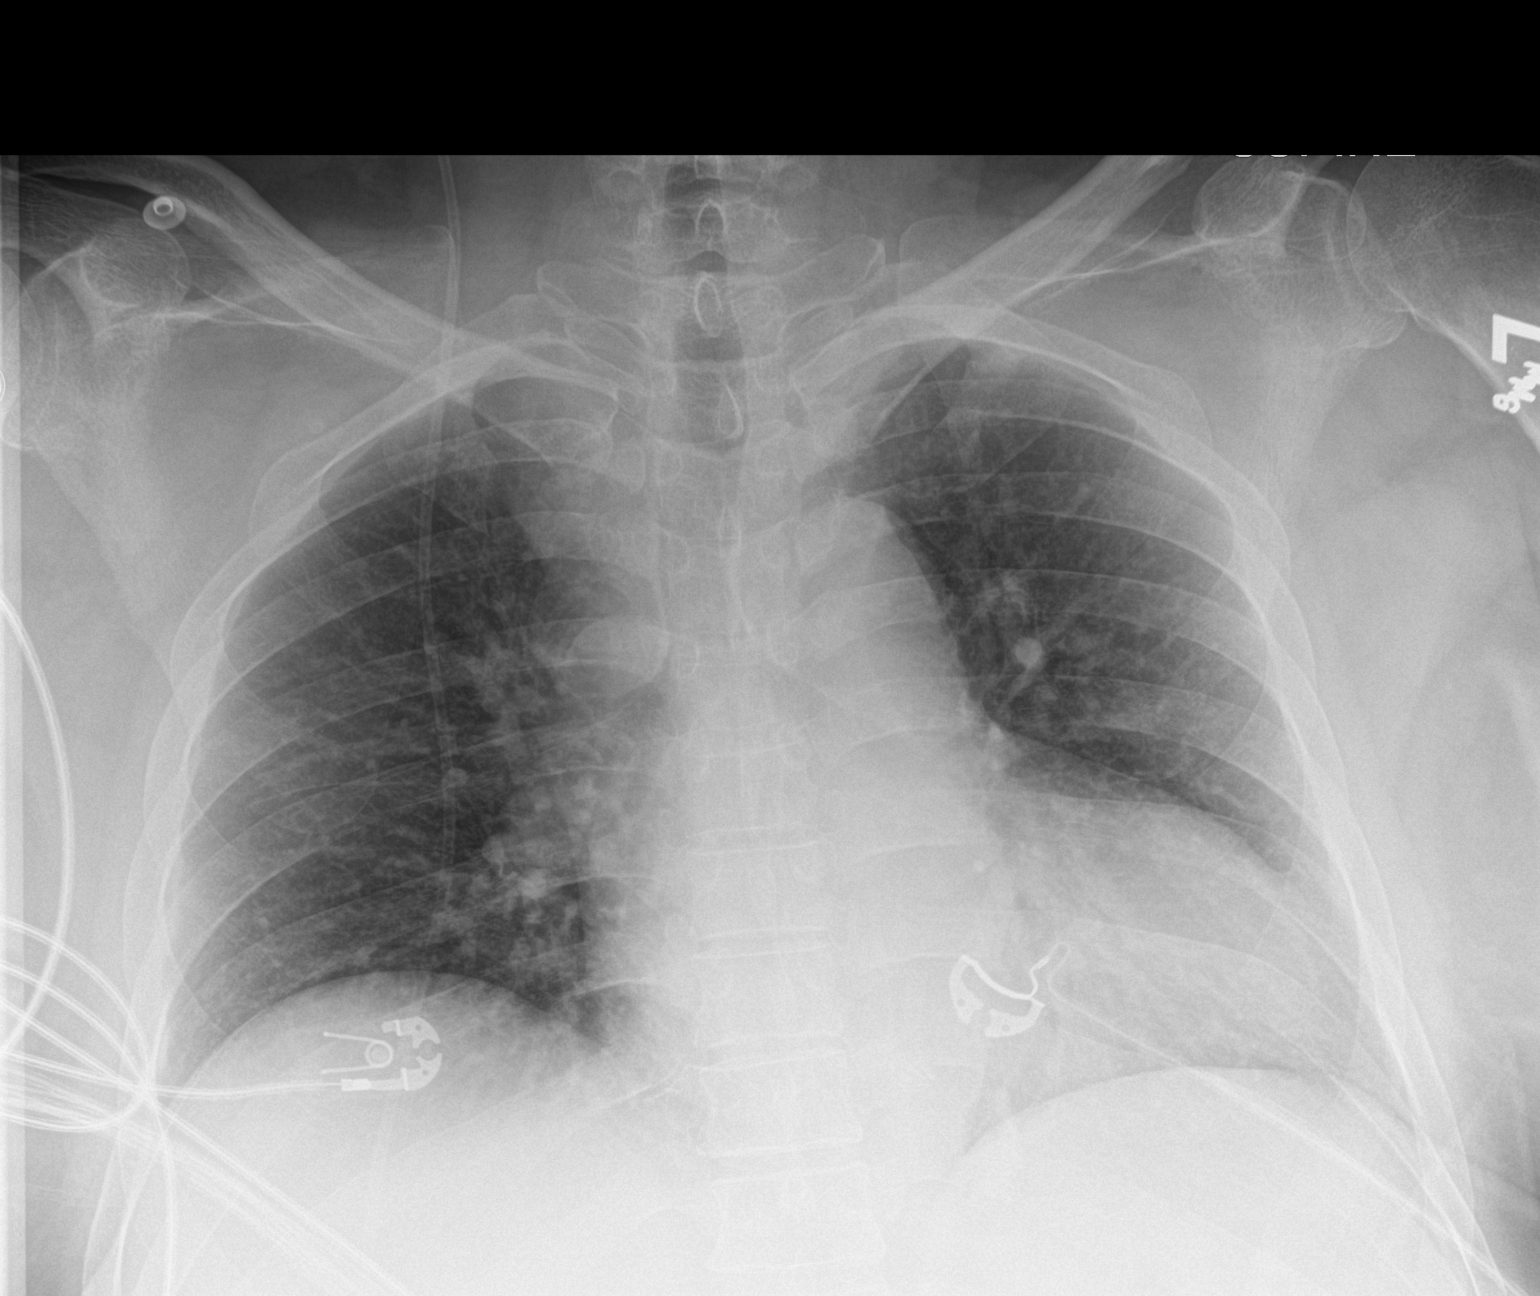

[1 of 1 positions shown; findings below may reference images not displayed]

FINDINGS: VP shunt tubing traverses RIGHT chest.

Enlargement of cardiac silhouette with tortuous aorta.

Pulmonary vascularity mediastinal contours otherwise normal.

Tracheostomy tube seen on previous exam no longer identified.

Lungs clear.

No pleural effusion or pneumothorax.

Bones unremarkable.
IMPRESSION: Enlargement of cardiac silhouette.

No acute abnormalities.

## 2016-05-30 ENCOUNTER — Other Ambulatory Visit: Payer: Self-pay | Admitting: Internal Medicine

## 2016-06-25 ENCOUNTER — Other Ambulatory Visit: Payer: Self-pay | Admitting: Internal Medicine

## 2016-06-25 ENCOUNTER — Other Ambulatory Visit: Payer: Self-pay | Admitting: Cardiovascular Disease

## 2016-06-26 ENCOUNTER — Other Ambulatory Visit: Payer: Self-pay

## 2016-06-26 MED ORDER — LISINOPRIL 20 MG PO TABS: 20.0000 mg | ORAL_TABLET | Freq: Every day | ORAL | Status: DC

## 2016-07-09 ENCOUNTER — Ambulatory Visit (INDEPENDENT_AMBULATORY_CARE_PROVIDER_SITE_OTHER): Payer: Medicare Other | Admitting: Internal Medicine

## 2016-07-09 ENCOUNTER — Encounter: Payer: Self-pay | Admitting: Internal Medicine

## 2016-07-09 ENCOUNTER — Other Ambulatory Visit (INDEPENDENT_AMBULATORY_CARE_PROVIDER_SITE_OTHER): Payer: Medicare Other

## 2016-07-09 VITALS — BP 146/80 | HR 100 | Temp 98.3°F | Resp 16 | Wt 266.0 lb

## 2016-07-09 DIAGNOSIS — E118 Type 2 diabetes mellitus with unspecified complications: Secondary | ICD-10-CM | POA: Diagnosis not present

## 2016-07-09 DIAGNOSIS — D529 Folate deficiency anemia, unspecified: Secondary | ICD-10-CM

## 2016-07-09 DIAGNOSIS — I10 Essential (primary) hypertension: Secondary | ICD-10-CM | POA: Diagnosis not present

## 2016-07-09 DIAGNOSIS — F329 Major depressive disorder, single episode, unspecified: Secondary | ICD-10-CM

## 2016-07-09 DIAGNOSIS — F32A Depression, unspecified: Secondary | ICD-10-CM

## 2016-07-09 DIAGNOSIS — E876 Hypokalemia: Secondary | ICD-10-CM

## 2016-07-09 DIAGNOSIS — E785 Hyperlipidemia, unspecified: Secondary | ICD-10-CM

## 2016-07-09 DIAGNOSIS — I5032 Chronic diastolic (congestive) heart failure: Secondary | ICD-10-CM | POA: Diagnosis not present

## 2016-07-09 DIAGNOSIS — I2510 Atherosclerotic heart disease of native coronary artery without angina pectoris: Secondary | ICD-10-CM

## 2016-07-09 LAB — LDL CHOLESTEROL, DIRECT: LDL DIRECT: 145 mg/dL

## 2016-07-09 LAB — CBC WITH DIFFERENTIAL/PLATELET
BASOS ABS: 0 10*3/uL (ref 0.0–0.1)
BASOS PCT: 0.3 % (ref 0.0–3.0)
EOS ABS: 0.3 10*3/uL (ref 0.0–0.7)
Eosinophils Relative: 3 % (ref 0.0–5.0)
HCT: 44.5 % (ref 39.0–52.0)
HEMOGLOBIN: 15 g/dL (ref 13.0–17.0)
Lymphocytes Relative: 15.1 % (ref 12.0–46.0)
Lymphs Abs: 1.6 10*3/uL (ref 0.7–4.0)
MCHC: 33.8 g/dL (ref 30.0–36.0)
MCV: 89.2 fl (ref 78.0–100.0)
MONO ABS: 0.6 10*3/uL (ref 0.1–1.0)
Monocytes Relative: 5.8 % (ref 3.0–12.0)
NEUTROS ABS: 8.2 10*3/uL — AB (ref 1.4–7.7)
Neutrophils Relative %: 75.8 % (ref 43.0–77.0)
PLATELETS: 311 10*3/uL (ref 150.0–400.0)
RBC: 4.99 Mil/uL (ref 4.22–5.81)
RDW: 13.4 % (ref 11.5–15.5)
WBC: 10.8 10*3/uL — AB (ref 4.0–10.5)

## 2016-07-09 LAB — COMPREHENSIVE METABOLIC PANEL
ALBUMIN: 4.5 g/dL (ref 3.5–5.2)
ALT: 14 U/L (ref 0–53)
AST: 14 U/L (ref 0–37)
Alkaline Phosphatase: 92 U/L (ref 39–117)
BUN: 18 mg/dL (ref 6–23)
CALCIUM: 10 mg/dL (ref 8.4–10.5)
CHLORIDE: 102 meq/L (ref 96–112)
CO2: 33 mEq/L — ABNORMAL HIGH (ref 19–32)
Creatinine, Ser: 1.55 mg/dL — ABNORMAL HIGH (ref 0.40–1.50)
GFR: 60.71 mL/min (ref >60.00)
Glucose, Bld: 162 mg/dL — ABNORMAL HIGH (ref 70–99)
POTASSIUM: 3.5 meq/L (ref 3.5–5.1)
Sodium: 143 mEq/L (ref 135–145)
Total Bilirubin: 0.6 mg/dL (ref 0.2–1.2)
Total Protein: 8.1 g/dL (ref 6.0–8.3)

## 2016-07-09 LAB — LIPID PANEL
CHOLESTEROL: 225 mg/dL — AB (ref 0–200)
HDL: 37.5 mg/dL — ABNORMAL LOW (ref >39.00)
NonHDL: 187.74
Total CHOL/HDL Ratio: 6
Triglycerides: 343 mg/dL — ABNORMAL HIGH (ref 0.0–149.0)
VLDL: 68.6 mg/dL — ABNORMAL HIGH (ref 0.0–40.0)

## 2016-07-09 LAB — HEMOGLOBIN A1C: HEMOGLOBIN A1C: 6 % (ref 4.6–6.5)

## 2016-07-09 LAB — TSH: TSH: 0.79 u[IU]/mL (ref 0.35–4.50)

## 2016-07-09 MED ORDER — POTASSIUM CHLORIDE ER 10 MEQ PO TBCR: 10.0000 meq | EXTENDED_RELEASE_TABLET | Freq: Every day | ORAL | 3 refills | Status: DC

## 2016-07-09 MED ORDER — METOPROLOL SUCCINATE ER 50 MG PO TB24: ORAL_TABLET | ORAL | 3 refills | Status: DC

## 2016-07-09 MED ORDER — LISINOPRIL 20 MG PO TABS: 20.0000 mg | ORAL_TABLET | Freq: Every day | ORAL | 3 refills | Status: DC

## 2016-07-09 MED ORDER — ESCITALOPRAM OXALATE 10 MG PO TABS: 10.0000 mg | ORAL_TABLET | Freq: Every day | ORAL | 3 refills | Status: DC

## 2016-07-09 NOTE — Progress Notes (Signed)
Pre visit review using our clinic review tool, if applicable. No additional management support is needed unless otherwise documented below in the visit note. 

## 2016-07-09 NOTE — Progress Notes (Signed)
Subjective:  Patient ID: Tony Davis., male    DOB: 16-Jun-1961  Age: 55 y.o. MRN: [id number]  CC: Anemia; Hypertension; Hyperlipidemia; and Diabetes   HPI Tony Davis. presents for follow up. He had a hemorrhagic stroke years ago and is now in a semicomatose state. Amazingly, he lives at home with his wife. She still goes to work and leaves him alone during the day. She tells me that he feeds himself but otherwise sits in the chair and does not do any other activities.  He is non-communicative so she gives the history that she thinks she has been doing well. She has not noticed to have any episodes of pain, edema or shortness of breath.  Outpatient Medications Prior to Visit  Medication Sig Dispense Refill  . aspirin EC 81 MG tablet Take 81 mg by mouth daily.    . furosemide (LASIX) 20 MG tablet Take 1 tablet (20 mg total) by mouth daily. 30 tablet 11  . atorvastatin (LIPITOR) 80 MG tablet Take 1 tablet (80 mg total) by mouth every evening.    . escitalopram (LEXAPRO) 10 MG tablet Take 10 mg by mouth every morning.     . escitalopram (LEXAPRO) 10 MG tablet Take 1 tablet (10 mg total) by mouth daily. --patient needs office visit for further refills 90 tablet 0  . escitalopram (LEXAPRO) 10 MG tablet TAKE ONE TABLET BY MOUTH DAILY  (OVERDUR FOR YEARLY PHYSICAL WITH LABS) 30 tablet 0  . lisinopril (PRINIVIL,ZESTRIL) 20 MG tablet Take 1 tablet (20 mg total) by mouth daily. 30 tablet 6  . lisinopril (PRINIVIL,ZESTRIL) 20 MG tablet TAKE 1 TABLET BY MOUTH DAILY 30 tablet 1  . lisinopril (PRINIVIL,ZESTRIL) 20 MG tablet Take 1 tablet (20 mg total) by mouth daily. 30 tablet 0  . metoprolol succinate (TOPROL-XL) 50 MG 24 hr tablet TAKE 1&1/2 TABLETS BY MOUTH TWICE A DAY. TAKE WITH OR IMMEDIATELY FOLLOWING A MEAL. 90 tablet 1  . potassium chloride (K-DUR) 10 MEQ tablet Take 1 tablet (10 mEq total) by mouth daily. 90 tablet 3   No facility-administered medications prior to visit.      ROS Review of Systems  Constitutional: Positive for fatigue. Negative for activity change, appetite change and unexpected weight change.  HENT: Negative for trouble swallowing.   Eyes: Negative.   Respiratory: Negative for cough, chest tightness, shortness of breath, wheezing and stridor.   Cardiovascular: Negative.  Negative for chest pain, palpitations and leg swelling.  Gastrointestinal: Negative.  Negative for abdominal pain.  Endocrine: Negative.   Genitourinary: Negative.        She tells me that he is incontinent with urine or feces.  Musculoskeletal: Negative.   Neurological: Positive for speech difficulty and weakness. Negative for seizures, syncope and headaches.  Hematological: Negative.  Negative for adenopathy. Does not bruise/bleed easily.  Psychiatric/Behavioral: Negative.     Objective:  BP (!) 146/80   Pulse 100   Temp 98.3 F (36.8 C) (Oral)   Resp 20   Wt 266 lb (120.7 kg)   SpO2 97%   BMI 37.10 kg/m   BP Readings from Last 3 Encounters:  07/09/16 (!) 146/80  12/26/15 136/86  11/30/15 (!) 146/83    Wt Readings from Last 3 Encounters:  07/09/16 266 lb (120.7 kg)  12/26/15 259 lb 12 oz (117.8 kg)  11/29/15 248 lb (112.5 kg)    Physical Exam  Constitutional:  Non-toxic appearance. He has a sickly appearance. He appears ill. No distress.  HENT:  Mouth/Throat: Oropharynx is clear and moist. No oropharyngeal exudate.  Eyes: Conjunctivae are normal. Right eye exhibits no discharge. No scleral icterus.  Neck: Normal range of motion. Neck supple. No JVD present. No tracheal deviation present. No thyromegaly present.  Cardiovascular: Normal rate, regular rhythm, normal heart sounds and intact distal pulses.  Exam reveals no gallop and no friction rub.   No murmur heard. Pulmonary/Chest: Effort normal and breath sounds normal. No stridor. No respiratory distress. He has no wheezes. He has no rales. He exhibits no tenderness.  Abdominal: Soft. Bowel  sounds are normal. He exhibits no distension and no mass. There is no tenderness. There is no rebound and no guarding.  Musculoskeletal: Normal range of motion. He exhibits edema (1+ pitting edema in BLE). He exhibits no tenderness or deformity.  Lymphadenopathy:    He has no cervical adenopathy.  Neurological: He is unresponsive. He is not disoriented. He displays atrophy. He displays no tremor. A sensory deficit is present. He exhibits abnormal muscle tone. He displays no seizure activity. Coordination and gait abnormal.  Skin: Skin is warm and dry. No rash noted. No erythema. No pallor.    Lab Results  Component Value Date   WBC 10.8 (H) 07/09/2016   HGB 15.0 07/09/2016   HCT 44.5 07/09/2016   PLT 311.0 07/09/2016   GLUCOSE 162 (H) 07/09/2016   CHOL 225 (H) 07/09/2016   TRIG 343.0 (H) 07/09/2016   HDL 37.50 (L) 07/09/2016   LDLDIRECT 145.0 07/09/2016   LDLCALC 64 02/11/2014   ALT 14 07/09/2016   AST 14 07/09/2016   NA 143 07/09/2016   K 3.5 07/09/2016   CL 102 07/09/2016   CREATININE 1.55 (H) 07/09/2016   BUN 18 07/09/2016   CO2 33 (H) 07/09/2016   TSH 0.79 07/09/2016   PSA 0.32 02/11/2014   INR 1.09 11/28/2015   HGBA1C 6.0 07/09/2016    Dg Chest 1 View  Result Date: 11/29/2015 CLINICAL DATA:  History of congestive heart failure EXAM: CHEST  1 VIEW COMPARISON:  09/09/2015 FINDINGS: The heart size and mediastinal contours are within normal limits. Both lungs are clear. The visualized skeletal structures are unremarkable. Ventricular shunt is noted. IMPRESSION: No active disease. Electronically Signed   By: Inez Catalina M.D.   On: 11/29/2015 18:23   Ct Head Wo Contrast  Result Date: 11/28/2015 CLINICAL DATA:  55 year old male with syncopal episode presenting with generalized weakness and confusion. EXAM: CT HEAD WITHOUT CONTRAST TECHNIQUE: Contiguous axial images were obtained from the base of the skull through the vertex without intravenous contrast. COMPARISON:  CT dated  09/09/2015 FINDINGS: Right frontal ventriculostomy shunt with tip in the frontal poles of the right lateral ventricle in stable positioning. There is stable decompressed appearance of the right lateral ventricle. The left ventricle appears stable in size compared to the prior study. The third and fourth ventricles appear normal for patient's age and stable in size. The sulci are appropriate in size for patient's age. Mild periventricular and deep white matter hypodensities represent chronic microvascular ischemic changes. There is no intracranial hemorrhage. No mass effect or midline shift identified. The visualized paranasal sinuses and mastoid air cells are well aerated. The calvarium is intact. IMPRESSION: No acute intracranial pathology. Stable appearance of the right ventriculostomy shunt and ventricular size. Electronically Signed   By: Anner Crete M.D.   On: 11/28/2015 19:42    Assessment & Plan:   Hamse was seen today for anemia, hypertension, hyperlipidemia and diabetes.  Diagnoses and  all orders for this visit:  Atherosclerosis of native coronary artery of native heart without angina pectoris -     Lipid panel; Future -     metoprolol succinate (TOPROL-XL) 50 MG 24 hr tablet; TAKE 1&1/2 TABLETS BY MOUTH TWICE A DAY. TAKE WITH OR IMMEDIATELY FOLLOWING A MEAL.  Essential hypertension- his blood pressure is well controlled, electrolytes and renal function are stable. -     CBC with Differential/Platelet; Future -     Comprehensive metabolic panel; Future -     lisinopril (PRINIVIL,ZESTRIL) 20 MG tablet; Take 1 tablet (20 mg total) by mouth daily. -     metoprolol succinate (TOPROL-XL) 50 MG 24 hr tablet; TAKE 1&1/2 TABLETS BY MOUTH TWICE A DAY. TAKE WITH OR IMMEDIATELY FOLLOWING A MEAL.  Diastolic dysfunction with chronic heart failure ([place])- he has a stable volume status, we'll continue the beta blocker, ACE inhibitor, and loop diuretic. -     lisinopril (PRINIVIL,ZESTRIL) 20 MG  tablet; Take 1 tablet (20 mg total) by mouth daily. -     metoprolol succinate (TOPROL-XL) 50 MG 24 hr tablet; TAKE 1&1/2 TABLETS BY MOUTH TWICE A DAY. TAKE WITH OR IMMEDIATELY FOLLOWING A MEAL.  Type 2 diabetes mellitus with complication, without long-term current use of insulin ([place])- his blood sugars are adequately well controlled. -     Hemoglobin A1c; Future -     lisinopril (PRINIVIL,ZESTRIL) 20 MG tablet; Take 1 tablet (20 mg total) by mouth daily.  Folate deficiency anemia- improvement noted -     CBC with Differential/Platelet; Future -     Folate; Future -     Vitamin B12; Future  Hyperlipidemia with target LDL less than 100- he has not achieved his LDL goal and is not recently taken a statin, best him to restart atorvastatin for CV risk reduction -     TSH; Future  Hypokalemia- her potassium level is stable, will continue potassium replacement therapy.  -     Comprehensive metabolic panel; Future -     potassium chloride (K-DUR) 10 MEQ tablet; Take 1 tablet (10 mEq total) by mouth daily.  Depression -     escitalopram (LEXAPRO) 10 MG tablet; Take 1 tablet (10 mg total) by mouth daily.   I have discontinued Mr. Randolf escitalopram, atorvastatin, and escitalopram. I have also changed his escitalopram. Additionally, I am having him maintain his aspirin EC, furosemide, lisinopril, potassium chloride, and metoprolol succinate.  Meds ordered this encounter  Medications  . escitalopram (LEXAPRO) 10 MG tablet    Sig: Take 1 tablet (10 mg total) by mouth daily.    Dispense:  90 tablet    Refill:  3    CYCLE FILL MEDICATION. Authorization is required for next refill.  Marland Kitchen lisinopril (PRINIVIL,ZESTRIL) 20 MG tablet    Sig: Take 1 tablet (20 mg total) by mouth daily.    Dispense:  90 tablet    Refill:  3  . potassium chloride (K-DUR) 10 MEQ tablet    Sig: Take 1 tablet (10 mEq total) by mouth daily.    Dispense:  90 tablet    Refill:  3  . metoprolol succinate (TOPROL-XL) 50 MG  24 hr tablet    Sig: TAKE 1&1/2 TABLETS BY MOUTH TWICE A DAY. TAKE WITH OR IMMEDIATELY FOLLOWING A MEAL.    Dispense:  270 tablet    Refill:  3    Please call and schedule an appointment     Follow-up: Return in about 6 months (around 01/09/2017).  [name], MD

## 2016-07-09 NOTE — Patient Instructions (Signed)
Hypertension Hypertension, commonly called high blood pressure, is when the force of blood pumping through your arteries is too strong. Your arteries are the blood vessels that carry blood from your heart throughout your body. A blood pressure reading consists of a higher number over a lower number, such as 110/72. The higher number (systolic) is the pressure inside your arteries when your heart pumps. The lower number (diastolic) is the pressure inside your arteries when your heart relaxes. Ideally you want your blood pressure below 120/80. Hypertension forces your heart to work harder to pump blood. Your arteries may become narrow or stiff. Having untreated or uncontrolled hypertension can cause heart attack, stroke, kidney disease, and other problems. RISK FACTORS Some risk factors for high blood pressure are controllable. Others are not.  Risk factors you cannot control include:   Race. You may be at higher risk if you are African American.  Age. Risk increases with age.  Gender. Men are at higher risk than women before age 45 years. After age 65, women are at higher risk than men. Risk factors you can control include:  Not getting enough exercise or physical activity.  Being overweight.  Getting too much fat, sugar, calories, or salt in your diet.  Drinking too much alcohol. SIGNS AND SYMPTOMS Hypertension does not usually cause signs or symptoms. Extremely high blood pressure (hypertensive crisis) may cause headache, anxiety, shortness of breath, and nosebleed. DIAGNOSIS To check if you have hypertension, your health care provider will measure your blood pressure while you are seated, with your arm held at the level of your heart. It should be measured at least twice using the same arm. Certain conditions can cause a difference in blood pressure between your right and left arms. A blood pressure reading that is higher than normal on one occasion does not mean that you need treatment. If  it is not clear whether you have high blood pressure, you may be asked to return on a different day to have your blood pressure checked again. Or, you may be asked to monitor your blood pressure at home for 1 or more weeks. TREATMENT Treating high blood pressure includes making lifestyle changes and possibly taking medicine. Living a healthy lifestyle can help lower high blood pressure. You may need to change some of your habits. Lifestyle changes may include:  Following the DASH diet. This diet is high in fruits, vegetables, and whole grains. It is low in salt, red meat, and added sugars.  Keep your sodium intake below 2,300 mg per day.  Getting at least 30-45 minutes of aerobic exercise at least 4 times per week.  Losing weight if necessary.  Not smoking.  Limiting alcoholic beverages.  Learning ways to reduce stress. Your health care provider may prescribe medicine if lifestyle changes are not enough to get your blood pressure under control, and if one of the following is true:  You are 18-59 years of age and your systolic blood pressure is above 140.  You are 60 years of age or older, and your systolic blood pressure is above 150.  Your diastolic blood pressure is above 90.  You have diabetes, and your systolic blood pressure is over 140 or your diastolic blood pressure is over 90.  You have kidney disease and your blood pressure is above 140/90.  You have heart disease and your blood pressure is above 140/90. Your personal target blood pressure may vary depending on your medical conditions, your age, and other factors. HOME CARE INSTRUCTIONS    Have your blood pressure rechecked as directed by your health care provider.   Take medicines only as directed by your health care provider. Follow the directions carefully. Blood pressure medicines must be taken as prescribed. The medicine does not work as well when you skip doses. Skipping doses also puts you at risk for  problems.  Do not smoke.   Monitor your blood pressure at home as directed by your health care provider. SEEK MEDICAL CARE IF:   You think you are having a reaction to medicines taken.  You have recurrent headaches or feel dizzy.  You have swelling in your ankles.  You have trouble with your vision. SEEK IMMEDIATE MEDICAL CARE IF:  You develop a severe headache or confusion.  You have unusual weakness, numbness, or feel faint.  You have severe chest or abdominal pain.  You vomit repeatedly.  You have trouble breathing. MAKE SURE YOU:   Understand these instructions.  Will watch your condition.  Will get help right away if you are not doing well or get worse.   This information is not intended to replace advice given to you by your health care provider. Make sure you discuss any questions you have with your health care provider.   Document Released: 11/26/2005 Document Revised: 04/12/2015 Document Reviewed: 09/18/2013 Elsevier Interactive Patient Education 2016 [employer]  

## 2016-07-10 ENCOUNTER — Encounter: Payer: Self-pay | Admitting: Internal Medicine

## 2016-07-10 LAB — FOLATE: FOLATE: 9.6 ng/mL (ref >5.9)

## 2016-07-10 LAB — VITAMIN B12: VITAMIN B 12: 264 pg/mL (ref 211–911)

## 2016-07-10 MED ORDER — ATORVASTATIN CALCIUM 40 MG PO TABS: 40.0000 mg | ORAL_TABLET | Freq: Every day | ORAL | 3 refills | Status: DC

## 2016-07-12 ENCOUNTER — Encounter: Payer: Self-pay | Admitting: Neurology

## 2016-08-01 ENCOUNTER — Encounter: Payer: Self-pay | Admitting: Neurology

## 2016-08-01 ENCOUNTER — Ambulatory Visit (INDEPENDENT_AMBULATORY_CARE_PROVIDER_SITE_OTHER): Payer: Medicare Other | Admitting: Neurology

## 2016-08-01 VITALS — BP 136/66 | HR 87 | Ht 72.0 in | Wt 267.0 lb

## 2016-08-01 DIAGNOSIS — I10 Essential (primary) hypertension: Secondary | ICD-10-CM | POA: Diagnosis not present

## 2016-08-01 DIAGNOSIS — I610 Nontraumatic intracerebral hemorrhage in hemisphere, subcortical: Secondary | ICD-10-CM

## 2016-08-01 DIAGNOSIS — E785 Hyperlipidemia, unspecified: Secondary | ICD-10-CM

## 2016-08-01 DIAGNOSIS — R4189 Other symptoms and signs involving cognitive functions and awareness: Secondary | ICD-10-CM | POA: Diagnosis not present

## 2016-08-01 DIAGNOSIS — I2510 Atherosclerotic heart disease of native coronary artery without angina pectoris: Secondary | ICD-10-CM

## 2016-08-01 DIAGNOSIS — F32A Depression, unspecified: Secondary | ICD-10-CM

## 2016-08-01 DIAGNOSIS — Z8673 Personal history of transient ischemic attack (TIA), and cerebral infarction without residual deficits: Secondary | ICD-10-CM | POA: Diagnosis not present

## 2016-08-01 DIAGNOSIS — F329 Major depressive disorder, single episode, unspecified: Secondary | ICD-10-CM

## 2016-08-01 MED ORDER — ATORVASTATIN CALCIUM 80 MG PO TABS: 80.0000 mg | ORAL_TABLET | Freq: Every day | ORAL | 5 refills | Status: DC

## 2016-08-01 NOTE — Patient Instructions (Signed)
1.  Increase atorvastatin to 80mg  daily 2.  Mediterranean diet    Why follow it? Research shows. . Those who follow the Mediterranean diet have a reduced risk of heart disease  . The diet is associated with a reduced incidence of Parkinson's and Alzheimer's diseases . People following the diet may have longer life expectancies and lower rates of chronic diseases  . The Dietary Guidelines for Americans recommends the Mediterranean diet as an eating plan to promote health and prevent disease  What Is the Mediterranean Diet?  . Healthy eating plan based on typical foods and recipes of Mediterranean-style cooking . The diet is primarily a plant based diet; these foods should make up a majority of meals   Starches - Plant based foods should make up a majority of meals - They are an important sources of vitamins, minerals, energy, antioxidants, and fiber - Choose whole grains, foods high in fiber and minimally processed items  - Typical grain sources include wheat, oats, barley, corn, brown rice, bulgar, farro, millet, polenta, couscous  - Various types of beans include chickpeas, lentils, fava beans, black beans, white beans   Fruits  Veggies - Large quantities of antioxidant rich fruits & veggies; 6 or more servings  - Vegetables can be eaten raw or lightly drizzled with oil and cooked  - Vegetables common to the traditional Mediterranean Diet include: artichokes, arugula, beets, broccoli, brussel sprouts, cabbage, carrots, celery, collard greens, cucumbers, eggplant, kale, leeks, lemons, lettuce, mushrooms, okra, onions, peas, peppers, potatoes, pumpkin, radishes, rutabaga, shallots, spinach, sweet potatoes, turnips, zucchini - Fruits common to the Mediterranean Diet include: apples, apricots, avocados, cherries, clementines, dates, figs, grapefruits, grapes, melons, nectarines, oranges, peaches, pears, pomegranates, strawberries, tangerines  Fats - Replace butter and margarine with healthy oils,  such as olive oil, canola oil, and tahini  - Limit nuts to no more than a handful a day  - Nuts include walnuts, almonds, pecans, pistachios, pine nuts  - Limit or avoid candied, honey roasted or heavily salted nuts - Olives are central to the [employer] - can be eaten whole or used in a variety of dishes   Meats Protein - Limiting red meat: no more than a few times a month - When eating red meat: choose lean cuts and keep the portion to the size of deck of cards - Eggs: approx. 0 to 4 times a week  - Fish and lean poultry: at least 2 a week  - Healthy protein sources include, chicken, [place], lean beef, lamb - Increase intake of seafood such as tuna, salmon, trout, mackerel, shrimp, scallops - Avoid or limit high fat processed meats such as sausage and bacon  Dairy - Include moderate amounts of low fat dairy products  - Focus on healthy dairy such as fat free yogurt, skim milk, low or reduced fat cheese - Limit dairy products higher in fat such as whole or 2% milk, cheese, ice cream  Alcohol - Moderate amounts of red wine is ok  - No more than 5 oz daily for women (all ages) and men older than age 34  - No more than 10 oz of wine daily for men younger than 25  Other - Limit sweets and other desserts  - Use herbs and spices instead of salt to flavor foods  - Herbs and spices common to the traditional Mediterranean Diet include: basil, bay leaves, chives, cloves, cumin, fennel, garlic, lavender, marjoram, mint, oregano, parsley, pepper, rosemary, sage, savory, sumac, tarragon, thyme  It's not just a diet, it's a lifestyle:  . The Mediterranean diet includes lifestyle factors typical of those in the region  . Foods, drinks and meals are best eaten with others and savored . Daily physical activity is important for overall good health . This could be strenuous exercise like running and aerobics . This could also be more leisurely activities such as walking, housework, yard-work, or  taking the stairs . Moderation is the key; a balanced and healthy diet accommodates most foods and drinks . Consider portion sizes and frequency of consumption of certain foods   Meal Ideas & Options:  . Breakfast:  o Whole wheat toast or whole wheat English muffins with peanut butter & hard boiled egg o Steel cut oats topped with apples & cinnamon and skim milk  o Fresh fruit: banana, strawberries, melon, berries, peaches  o Smoothies: strawberries, bananas, greek yogurt, peanut butter o Low fat greek yogurt with blueberries and granola  o Egg white omelet with spinach and mushrooms o Breakfast couscous: whole wheat couscous, apricots, skim milk, cranberries  . Sandwiches:  o Hummus and grilled vegetables (peppers, zucchini, squash) on whole wheat bread   o Grilled chicken on whole wheat pita with lettuce, tomatoes, cucumbers or tzatziki  o Tuna salad on whole wheat bread: tuna salad made with greek yogurt, olives, red peppers, capers, green onions o Garlic rosemary lamb pita: lamb sauted with garlic, rosemary, salt & pepper; add lettuce, cucumber, greek yogurt to pita - flavor with lemon juice and black pepper  . Seafood:  o Mediterranean grilled salmon, seasoned with garlic, basil, parsley, lemon juice and black pepper o Shrimp, lemon, and spinach whole-grain pasta salad made with low fat greek yogurt  o Seared scallops with lemon orzo  o Seared tuna steaks seasoned salt, pepper, coriander topped with tomato mixture of olives, tomatoes, olive oil, minced garlic, parsley, green onions and cappers  . Meats:  o Herbed greek chicken salad with kalamata olives, cucumber, feta  o Red bell peppers stuffed with spinach, bulgur, lean ground beef (or lentils) & topped with feta   o Kebabs: skewers of chicken, tomatoes, onions, zucchini, squash  o [place] burgers: made with red onions, mint, dill, lemon juice, feta cheese topped with roasted red peppers . Vegetarian o Cucumber salad: cucumbers,  artichoke hearts, celery, red onion, feta cheese, tossed in olive oil & lemon juice  o Hummus and whole grain pita points with a greek salad (lettuce, tomato, feta, olives, cucumbers, red onion) o Lentil soup with celery, carrots made with vegetable broth, garlic, salt and pepper  o Tabouli salad: parsley, bulgur, mint, scallions, cucumbers, tomato, radishes, lemon juice, olive oil, salt and pepper. 3.  Continue aspirin 81mg  daily 4.  Follow up in one year

## 2016-08-01 NOTE — Progress Notes (Signed)
NEUROLOGY FOLLOW UP OFFICE NOTE  Tony Davis HR:[id number]  HISTORY OF PRESENT ILLNESS: Tony Davis, Ros. is a 55 year old right-handed male with CAD, hypertension, hyperlipidemia, former smoker and history of CVAs and TIAs who follows up for history of ischemic and hemorrhagic strokes.  He is accompanied by his wife who provides some history.  UPDATE: CT of head from 08/03/15 was personally reviewed and showed VP shunt with remote ischemia in the left thalamus and right basal ganglia.  He was hospitalized in December for syncope.  CT of head was personally reviewed and was unremarkable.  EEG was unremarkable.  It was determined that he was dehydrated due to Lasix therapy.  Lipid panel from last month showed total cholesterol 225, TG 343, HDL 37.50 and direct LDL 145.  However, he had not been taking his statin for several months, so he was started on Lipitor 80mg .  Hgb A1c was 6.  B12 was 264 and TSH was 0.79.  He hasn't fallen recently but has fallen out of bed.     HISTORY: On 06/13/14, he was admitted to an outside hospital in [place], [place] for left basal ganglia hemorrhage with intraventricular extension and hydrocephalus, which required a right craniotomy and VP shunt.  During his hospitalization, he required a tracheostomy and PEG following Enterobacter PNA and acute respiratory failure.  He was also diagnosed with bilateral DVTs, and therefore underwent IVC placement.  I do not have the actual hospital records or imaging reports available.   As per prior neurologists' notes, he has a history of CVAs in the past.  He had an ischemic stroke in 2011, presenting as left sided weakness and visual field loss.  Since the initial stroke, he reported reading deficits and did undergo speech therapy.  He also had recurrent transient episodes of left visual field loss and left sided weakness. continued to have transient left visual field loss.  MRA of head and neck from 08/17/11 showed no  intracranial or hemodynamically significant internal carotid artery stenosis.  He was admitted to [facility] in [place], [place] for similar symptoms in April 2013.  MRI of the head showed moderate chronic small vessel disease and remote right caudate lacunar infarct but no acute infarct or hemorrhage.  MRA of the neck showed no significant stenosis.  I do not have report of the MRA of the head, but reportedly there was no significant stenosis.  2D echo did reveal a positive bubble study.  Lower extremity dopplers revealed a DVT and he was started on anticoagulation.  Back in [place], TEE was performed which confirmed PFO.  Despite having an ischemic stroke and DVT in setting of PFO, it was felt that closure was not indicated.  He was continued on Coumadin for up until the hemorrhagic stroke.  The transient episodes became more frequent in 2014.  In absence of negative brain MRIs and no focal intracranial or extracranial stenosis, it was concluded that his symptoms were psychogenic.     Since the hemorrhagic stroke, he has very mild residual right sided weakness.  He exhibits reduced verbal output, short-term memory problems and depression.  At baseline, he wears diapers.  His wife administers his medications.  He is able to dress himself.  PAST MEDICAL HISTORY: Past Medical History:  Diagnosis Date  . Acute respiratory failure ([place]) 2015   a. Following BG hemorrhage - s/p trache with decannulation.  . Arthritis    "knees" (09/25/2013)  . Basal ganglia hemorrhage ([place]) 2015  a. 2015 - with intraventricular extension and hydrocephalus requiring right crani and VP shunt, complicated by encephalopathy, protein calorie malnutrition, acute resp failure req trache and PEG, BLE DVT s/p IVC, enterobacter PNA, subsequent right hemiparesis and difficulty communicating.  Marland Kitchen BPH (benign prostatic hyperplasia)   . CAD (coronary artery disease)    a. s/p promus DES circumflex artery 10/13/10. b. Cath  04/2013: stable disease but possible small vessel disease -Imdur added.  . Carotid artery disease ([place])    a. 1-29% BICA by study 04/2014.  Marland Kitchen CVA (cerebral vascular accident) ([place]) 02/2010   a. CVA 2011, prior reported TIAs. b. CNS hemorrhage 2015.  Marland Kitchen CVA (cerebral vascular accident) ([place]) 06/2014   "hemorrhagic; in [place]; brain activity diminshed; ability to do anything; can't do ADLs on own"  . DVT (deep venous thrombosis) ([place]) 2013   a. 2013. b. 2015 - reported BLE DVT s/p IVC filter.   . Enterobacter cloacae pneumonia ([place]) 2015  . [place]   . Folate deficiency anemia   . GERD (gastroesophageal reflux disease)   . Hematuria 8/12  . HLD (hyperlipidemia)   . HTN (hypertension)   . Hydrocephalus 2015   a. 2/2 BG hemorrhage.  . Incontinence of feces   . LV dysfunction    a. 2011 - EF 45-50%, subsequent 55-60% in 04/2014.  Marland Kitchen Noncompliance   . OSA on CPAP    "not using CPAP right now" (11/30/2014)  . PFO (patent foramen ovale)    a. echo 8/12: EF 55-65%, grade 2 diast dysfnx; +PFO on bubble study. b. TEE 4/13: EF normal, atrial septum with suspicion for interatrial septum fenestrations without flow across and few large bubbles noted in LA.  This was not felt to require closure   . Protein calorie malnutrition ([place])   . Rheumatoid arthritis(714.0)   . S/P percutaneous endoscopic gastrostomy (PEG) tube placement ([place]) 2015  . TIA (transient ischemic attack)    "1-2; both after the one in 02/2010" (09/25/2013)  . Type II diabetes mellitus ([place])   . Urinary incontinence     MEDICATIONS: Current Outpatient Prescriptions on File Prior to Visit  Medication Sig Dispense Refill  . aspirin EC 81 MG tablet Take 81 mg by mouth daily.    Marland Kitchen escitalopram (LEXAPRO) 10 MG tablet Take 1 tablet (10 mg total) by mouth daily. 90 tablet 3  . lisinopril (PRINIVIL,ZESTRIL) 20 MG tablet Take 1 tablet (20 mg total) by mouth daily. 90 tablet 3  . metoprolol succinate (TOPROL-XL) 50 MG 24 hr tablet TAKE 1&1/2  TABLETS BY MOUTH TWICE A DAY. TAKE WITH OR IMMEDIATELY FOLLOWING A MEAL. 270 tablet 3  . potassium chloride (K-DUR) 10 MEQ tablet Take 1 tablet (10 mEq total) by mouth daily. 90 tablet 3  . furosemide (LASIX) 20 MG tablet Take 1 tablet (20 mg total) by mouth daily. (Patient not taking: Reported on 08/01/2016) 30 tablet 11   No current facility-administered medications on file prior to visit.     ALLERGIES: Allergies  Allergen Reactions  . Iodine Anaphylaxis  . Amlodipine Swelling and Other (See Comments)    Foot edema    FAMILY HISTORY: Family History  Problem Relation Age of Onset  . Cancer Neg Hx   . Kidney disease Neg Hx   . Diabetes Neg Hx   . Heart attack Sister   . Heart attack Father   . Hypertension Brother   . Stroke Father   . Seizures Sister   . Stroke Sister  SOCIAL HISTORY: Social History   Social History  . Marital status: Married    Spouse name: N/A  . Number of children: N/A  . Years of education: N/A   Occupational History  . Not on file.   Social History Main Topics  . Smoking status: Former Smoker    Packs/day: 1.00    Years: 35.00    Types: Cigarettes    Quit date: 03/10/2012  . Smokeless tobacco: Never Used  . Alcohol use No  . Drug use: No  . Sexual activity: Not Currently    Partners: Female   Other Topics Concern  . Not on file   Social History Narrative   Married, 5 kids; lives in [place]. 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     REVIEW OF SYSTEMS: Constitutional: No fevers, chills, or sweats, no generalized fatigue, change in appetite Eyes: No visual changes, double vision, eye pain Ear, nose and throat: No hearing loss, ear pain, nasal congestion, sore throat Cardiovascular: No chest pain, palpitations Respiratory:  No shortness of breath at rest or with exertion, wheezes GastrointestinaI: No nausea, vomiting, diarrhea, abdominal pain Genitourinary:  No dysuria, urinary retention or  frequency Musculoskeletal:  No neck pain, back pain Integumentary: No rash, pruritus, skin lesions Neurological: as above Psychiatric: Depressed mood Endocrine: No palpitations, fatigue, diaphoresis, mood swings, change in appetite, change in weight, increased thirst Hematologic/Lymphatic:  No purpura, petechiae. Allergic/Immunologic: no itchy/runny eyes, nasal congestion, recent allergic reactions, rashes  PHYSICAL EXAM: Vitals:   08/01/16 0941  BP: 136/66  Pulse: 87   General: No acute distress.  Depressed mood Head:  Normocephalic/atraumatic Eyes:  Fundi examined but not visualized Neck: supple, no paraspinal tenderness, full range of motion Heart:  Regular rate and rhythm Lungs:  Clear to auscultation bilaterally Back: No paraspinal tenderness Neurological Exam: alert and oriented to person only, delayed recall poor, remote memory intact, fund of knowledge intact, attention and concentration impaired,  slowed processing speed.  Decreased verbal output but speech fluent and not dysarthric, he is able to name and repeat but has difficulty with complex commands.  Bradyphrenic.  Depressed mood. MMSE - Mini Mental State Exam 08/01/2016  Not completed: Unable to complete  Orientation to time 1  Orientation to Place 0  Registration 3  Attention/ Calculation 0  Recall 0  Language- name 2 objects 0  Language- repeat 1  Language- follow 3 step command 0  Language- read & follow direction 0  Write a sentence 0  Copy design 0  Total score 5  CN II-XII intact.  Bulk and tone normal.  Muscle strength 5-/5 in deltoids, otherwise 5/5.  Sensation to light touch intact.  Deep tendon reflexes 2+ throughout, toes downgoing.  Finger to nose slowed but without dysmetria.  Wide-based gait with short-strides.   IMPRESSION: History of ischemic CVAs History of hemorrhagic stroke.  Based on location, likely related to elevated blood pressure Cognitive impairment related to  stroke Depression Hypertension Hyperlipidemia  PLAN: 1.  ASA 81mg  daily for secondary stroke prevention 2.  Will increase Lipitor to 80mg  daily.  LDL goal should be less than 70.  I advise that it should be checked prior to follow up with Dr. [name] in 6 months, so he may make any necessary adjustments. 3.  Mediterranean diet 4.  Fall precautions.  Discussed railings for bed. 5.  Follow up in one year  28 minutes spent face to face with patient, over 50% spent counseling.  Tony Clines, DO  CC:  [name], MD

## 2017-07-16 ENCOUNTER — Other Ambulatory Visit: Payer: Self-pay | Admitting: Internal Medicine

## 2017-07-16 DIAGNOSIS — E876 Hypokalemia: Secondary | ICD-10-CM

## 2017-07-17 ENCOUNTER — Encounter: Payer: Self-pay | Admitting: Internal Medicine

## 2017-07-17 ENCOUNTER — Ambulatory Visit (INDEPENDENT_AMBULATORY_CARE_PROVIDER_SITE_OTHER): Payer: Medicare Other | Admitting: Internal Medicine

## 2017-07-17 VITALS — BP 144/84 | HR 81 | Temp 98.4°F | Ht 72.0 in | Wt 259.5 lb

## 2017-07-17 DIAGNOSIS — I69359 Hemiplegia and hemiparesis following cerebral infarction affecting unspecified side: Secondary | ICD-10-CM

## 2017-07-17 DIAGNOSIS — I5032 Chronic diastolic (congestive) heart failure: Secondary | ICD-10-CM | POA: Diagnosis not present

## 2017-07-17 DIAGNOSIS — I10 Essential (primary) hypertension: Secondary | ICD-10-CM

## 2017-07-17 DIAGNOSIS — E876 Hypokalemia: Secondary | ICD-10-CM

## 2017-07-17 DIAGNOSIS — F320 Major depressive disorder, single episode, mild: Secondary | ICD-10-CM | POA: Diagnosis not present

## 2017-07-17 DIAGNOSIS — E785 Hyperlipidemia, unspecified: Secondary | ICD-10-CM | POA: Diagnosis not present

## 2017-07-17 DIAGNOSIS — I2510 Atherosclerotic heart disease of native coronary artery without angina pectoris: Secondary | ICD-10-CM | POA: Diagnosis not present

## 2017-07-17 DIAGNOSIS — R7303 Prediabetes: Secondary | ICD-10-CM

## 2017-07-17 DIAGNOSIS — D520 Dietary folate deficiency anemia: Secondary | ICD-10-CM | POA: Diagnosis not present

## 2017-07-17 DIAGNOSIS — G919 Hydrocephalus, unspecified: Secondary | ICD-10-CM | POA: Diagnosis not present

## 2017-07-17 DIAGNOSIS — E118 Type 2 diabetes mellitus with unspecified complications: Secondary | ICD-10-CM

## 2017-07-17 NOTE — Progress Notes (Signed)
Subjective:  Patient ID: Tony Davis., male    DOB: 1961/10/27  Age: 56 y.o. MRN: [id number]  CC: Hypertension and Hyperlipidemia   HPI [name]. presents for f/up - He is with his wife today who is his primary caregiver. He doesn't communicate with her much but she thinks he's had some headaches recently and worsening memory. She wants him to have a follow-up appointment with neurology and neurosurgery. She tells me he eats well and has maintained his weight. She has not recently noticed any chest pain, coughing, shortness of breath, edema, or fatigue.  Outpatient Medications Prior to Visit  Medication Sig Dispense Refill  . aspirin EC 81 MG tablet Take 81 mg by mouth daily.    Marland Kitchen atorvastatin (LIPITOR) 80 MG tablet Take 1 tablet (80 mg total) by mouth daily. 30 tablet 5  . escitalopram (LEXAPRO) 10 MG tablet Take 1 tablet (10 mg total) by mouth daily. 90 tablet 3  . lisinopril (PRINIVIL,ZESTRIL) 20 MG tablet Take 1 tablet (20 mg total) by mouth daily. 90 tablet 3  . metoprolol succinate (TOPROL-XL) 50 MG 24 hr tablet TAKE 1&1/2 TABLETS BY MOUTH TWICE A DAY. TAKE WITH OR IMMEDIATELY FOLLOWING A MEAL. 270 tablet 3  . potassium chloride (K-DUR) 10 MEQ tablet Take 1 tablet (10 mEq total) by mouth daily. 90 tablet 3  . furosemide (LASIX) 20 MG tablet Take 1 tablet (20 mg total) by mouth daily. (Patient not taking: Reported on 08/01/2016) 30 tablet 11   No facility-administered medications prior to visit.     ROS Review of Systems  HENT: Negative.  Negative for trouble swallowing.   Eyes: Negative for visual disturbance.  Respiratory: Negative for cough.   Cardiovascular: Negative for chest pain.  Gastrointestinal: Negative for abdominal pain, diarrhea, nausea and vomiting.  Endocrine: Negative.   Genitourinary: Negative.  Negative for difficulty urinating.  Musculoskeletal: Negative.   Skin: Negative.   Allergic/Immunologic: Negative.   Hematological: Negative for  adenopathy. Does not bruise/bleed easily.  Psychiatric/Behavioral: Positive for confusion and decreased concentration. Negative for sleep disturbance and suicidal ideas. The patient is not nervous/anxious.     Objective:  BP (!) 144/84 (BP Location: Left Arm, Patient Position: Sitting, Cuff Size: Normal)   Pulse 81   Temp 98.4 F (36.9 C) (Oral)   Ht 6' (1.829 m)   Wt 259 lb 8 oz (117.7 kg)   SpO2 98%   BMI 35.19 kg/m   BP Readings from Last 3 Encounters:  07/18/17 (!) 144/84  08/01/16 136/66  07/09/16 (!) 146/80    Wt Readings from Last 3 Encounters:  07/18/17 259 lb 8 oz (117.7 kg)  08/01/16 267 lb (121.1 kg)  07/09/16 266 lb (120.7 kg)    Physical Exam  Constitutional: No distress.  HENT:  Mouth/Throat: Oropharynx is clear and moist. No oropharyngeal exudate.  Eyes: Conjunctivae are normal. Right eye exhibits no discharge. Left eye exhibits no discharge. No scleral icterus.  Neck: Normal range of motion. Neck supple. No JVD present. No thyromegaly present.  Cardiovascular: Normal rate, regular rhythm and intact distal pulses.  Exam reveals no gallop and no friction rub.   No murmur heard. Pulmonary/Chest: Effort normal and breath sounds normal. No respiratory distress. He has no wheezes. He has no rales. He exhibits no tenderness.  Abdominal: Soft. Bowel sounds are normal. He exhibits no distension and no mass. There is no tenderness. There is no rebound and no guarding.  Musculoskeletal: Normal range of motion. He  exhibits no edema, tenderness or deformity.  Lymphadenopathy:    He has no cervical adenopathy.  Neurological: He is alert.  Skin: Skin is warm and dry. No rash noted. He is not diaphoretic. No erythema. No pallor.  Vitals reviewed.   Lab Results  Component Value Date   WBC 10.8 (H) 07/09/2016   HGB 15.0 07/09/2016   HCT 44.5 07/09/2016   PLT 311.0 07/09/2016   GLUCOSE 162 (H) 07/09/2016   CHOL 225 (H) 07/09/2016   TRIG 343.0 (H) 07/09/2016   HDL  37.50 (L) 07/09/2016   LDLDIRECT 145.0 07/09/2016   LDLCALC 64 02/11/2014   ALT 14 07/09/2016   AST 14 07/09/2016   NA 143 07/09/2016   K 3.5 07/09/2016   CL 102 07/09/2016   CREATININE 1.55 (H) 07/09/2016   BUN 18 07/09/2016   CO2 33 (H) 07/09/2016   TSH 0.79 07/09/2016   PSA 0.32 02/11/2014   INR 1.09 11/28/2015   HGBA1C 6.0 07/09/2016    Dg Chest 1 View  Result Date: 11/29/2015 CLINICAL DATA:  History of congestive heart failure EXAM: CHEST  1 VIEW COMPARISON:  09/09/2015 FINDINGS: The heart size and mediastinal contours are within normal limits. Both lungs are clear. The visualized skeletal structures are unremarkable. Ventricular shunt is noted. IMPRESSION: No active disease. Electronically Signed   By: Inez Catalina M.D.   On: 11/29/2015 18:23   Ct Head Wo Contrast  Result Date: 11/28/2015 CLINICAL DATA:  56 year old male with syncopal episode presenting with generalized weakness and confusion. EXAM: CT HEAD WITHOUT CONTRAST TECHNIQUE: Contiguous axial images were obtained from the base of the skull through the vertex without intravenous contrast. COMPARISON:  CT dated 09/09/2015 FINDINGS: Right frontal ventriculostomy shunt with tip in the frontal poles of the right lateral ventricle in stable positioning. There is stable decompressed appearance of the right lateral ventricle. The left ventricle appears stable in size compared to the prior study. The third and fourth ventricles appear normal for patient's age and stable in size. The sulci are appropriate in size for patient's age. Mild periventricular and deep white matter hypodensities represent chronic microvascular ischemic changes. There is no intracranial hemorrhage. No mass effect or midline shift identified. The visualized paranasal sinuses and mastoid air cells are well aerated. The calvarium is intact. IMPRESSION: No acute intracranial pathology. Stable appearance of the right ventriculostomy shunt and ventricular size.  Electronically Signed   By: Anner Crete M.D.   On: 11/28/2015 19:42    Assessment & Plan:   Nysir was seen today for hypertension and hyperlipidemia.  Diagnoses and all orders for this visit:  Hyperlipidemia with target LDL less than 100- He is doing well on the statin. Will check his FLP to see if he is achieved his LDL goal. -     Lipid panel; Future -     TSH; Future -     atorvastatin (LIPITOR) 80 MG tablet; Take 1 tablet (80 mg total) by mouth daily.  Hydrocephalus -     Ambulatory referral to Neurosurgery  Hemiparesis due to old cerebral infarction [facility]) -     Ambulatory referral to Neurology  Essential hypertension- his blood pressure is well-controlled. Will monitor his electrolytes and renal function. We'll continue the current regimen for blood pressure control. -     Comprehensive metabolic panel; Future -     lisinopril (PRINIVIL,ZESTRIL) 20 MG tablet; Take 1 tablet (20 mg total) by mouth daily. -     metoprolol succinate (TOPROL-XL) 50 MG 24  hr tablet; TAKE 1&1/2 TABLETS BY MOUTH TWICE A DAY. TAKE WITH OR IMMEDIATELY FOLLOWING A MEAL.  Atherosclerosis of native coronary artery of native heart without angina pectoris- he said no recent episodes of chest pain or shortness of breath. We'll continue the current allotment of medications for risk reduction. -     metoprolol succinate (TOPROL-XL) 50 MG 24 hr tablet; TAKE 1&1/2 TABLETS BY MOUTH TWICE A DAY. TAKE WITH OR IMMEDIATELY FOLLOWING A MEAL.  Dietary folate deficiency anemia- I will recheck his H&H to see if this is been adequately treated. -     CBC with Differential/Platelet; Future  Prediabetes- will check his A1c to see if his developed diabetes mellitus. -     Hemoglobin A1c; Future  Current mild episode of major depressive disorder without prior episode (HCC) -     escitalopram (LEXAPRO) 10 MG tablet; Take 1 tablet (10 mg total) by mouth daily.  Diastolic dysfunction with chronic heart failure ([place])- he  has a normal volume status. Will continue strict blood pressure control. -     lisinopril (PRINIVIL,ZESTRIL) 20 MG tablet; Take 1 tablet (20 mg total) by mouth daily. -     metoprolol succinate (TOPROL-XL) 50 MG 24 hr tablet; TAKE 1&1/2 TABLETS BY MOUTH TWICE A DAY. TAKE WITH OR IMMEDIATELY FOLLOWING A MEAL.  Type 2 diabetes mellitus with complication, without long-term current use of insulin (HCC) -     lisinopril (PRINIVIL,ZESTRIL) 20 MG tablet; Take 1 tablet (20 mg total) by mouth daily.  Hypokalemia -     potassium chloride (K-DUR) 10 MEQ tablet; Take 1 tablet (10 mEq total) by mouth daily.   I have discontinued Mr. Toran furosemide. I am also having him maintain his aspirin EC, atorvastatin, escitalopram, lisinopril, metoprolol succinate, and potassium chloride.  Meds ordered this encounter  Medications  . atorvastatin (LIPITOR) 80 MG tablet    Sig: Take 1 tablet (80 mg total) by mouth daily.    Dispense:  90 tablet    Refill:  1  . escitalopram (LEXAPRO) 10 MG tablet    Sig: Take 1 tablet (10 mg total) by mouth daily.    Dispense:  90 tablet    Refill:  1  . lisinopril (PRINIVIL,ZESTRIL) 20 MG tablet    Sig: Take 1 tablet (20 mg total) by mouth daily.    Dispense:  90 tablet    Refill:  1  . metoprolol succinate (TOPROL-XL) 50 MG 24 hr tablet    Sig: TAKE 1&1/2 TABLETS BY MOUTH TWICE A DAY. TAKE WITH OR IMMEDIATELY FOLLOWING A MEAL.    Dispense:  270 tablet    Refill:  1  . potassium chloride (K-DUR) 10 MEQ tablet    Sig: Take 1 tablet (10 mEq total) by mouth daily.    Dispense:  90 tablet    Refill:  1     Follow-up: No Follow-up on file.  [name], MD

## 2017-07-18 ENCOUNTER — Encounter: Payer: Self-pay | Admitting: Internal Medicine

## 2017-07-19 MED ORDER — POTASSIUM CHLORIDE ER 10 MEQ PO TBCR: 10.0000 meq | EXTENDED_RELEASE_TABLET | Freq: Every day | ORAL | 1 refills | Status: DC

## 2017-07-19 MED ORDER — ATORVASTATIN CALCIUM 80 MG PO TABS: 80.0000 mg | ORAL_TABLET | Freq: Every day | ORAL | 1 refills | Status: DC

## 2017-07-19 MED ORDER — METOPROLOL SUCCINATE ER 50 MG PO TB24: ORAL_TABLET | ORAL | 1 refills | Status: DC

## 2017-07-19 MED ORDER — ESCITALOPRAM OXALATE 10 MG PO TABS: 10.0000 mg | ORAL_TABLET | Freq: Every day | ORAL | 1 refills | Status: DC

## 2017-07-19 MED ORDER — LISINOPRIL 20 MG PO TABS: 20.0000 mg | ORAL_TABLET | Freq: Every day | ORAL | 1 refills | Status: DC

## 2017-07-22 ENCOUNTER — Ambulatory Visit: Payer: Medicare Other | Admitting: Neurology

## 2017-07-27 ENCOUNTER — Other Ambulatory Visit: Payer: Self-pay | Admitting: Internal Medicine

## 2017-07-27 DIAGNOSIS — I2510 Atherosclerotic heart disease of native coronary artery without angina pectoris: Secondary | ICD-10-CM

## 2017-07-27 DIAGNOSIS — E785 Hyperlipidemia, unspecified: Secondary | ICD-10-CM

## 2017-08-28 DIAGNOSIS — I10 Essential (primary) hypertension: Secondary | ICD-10-CM | POA: Diagnosis not present

## 2017-08-28 DIAGNOSIS — Z7409 Other reduced mobility: Secondary | ICD-10-CM | POA: Diagnosis not present

## 2017-08-28 DIAGNOSIS — I69359 Hemiplegia and hemiparesis following cerebral infarction affecting unspecified side: Secondary | ICD-10-CM | POA: Diagnosis not present

## 2017-08-28 DIAGNOSIS — G919 Hydrocephalus, unspecified: Secondary | ICD-10-CM | POA: Diagnosis not present

## 2017-08-28 DIAGNOSIS — E7800 Pure hypercholesterolemia, unspecified: Secondary | ICD-10-CM | POA: Diagnosis not present

## 2017-08-28 DIAGNOSIS — F331 Major depressive disorder, recurrent, moderate: Secondary | ICD-10-CM | POA: Diagnosis not present

## 2017-09-02 DIAGNOSIS — M6281 Muscle weakness (generalized): Secondary | ICD-10-CM | POA: Diagnosis not present

## 2017-09-02 DIAGNOSIS — I69359 Hemiplegia and hemiparesis following cerebral infarction affecting unspecified side: Secondary | ICD-10-CM | POA: Diagnosis not present

## 2017-09-02 DIAGNOSIS — G919 Hydrocephalus, unspecified: Secondary | ICD-10-CM | POA: Diagnosis not present

## 2017-09-02 DIAGNOSIS — R2689 Other abnormalities of gait and mobility: Secondary | ICD-10-CM | POA: Diagnosis not present

## 2017-09-02 DIAGNOSIS — Z7409 Other reduced mobility: Secondary | ICD-10-CM | POA: Diagnosis not present

## 2017-09-03 DIAGNOSIS — Z7409 Other reduced mobility: Secondary | ICD-10-CM | POA: Diagnosis not present

## 2017-09-03 DIAGNOSIS — I69359 Hemiplegia and hemiparesis following cerebral infarction affecting unspecified side: Secondary | ICD-10-CM | POA: Diagnosis not present

## 2017-09-03 DIAGNOSIS — G919 Hydrocephalus, unspecified: Secondary | ICD-10-CM | POA: Diagnosis not present

## 2017-09-03 DIAGNOSIS — R2689 Other abnormalities of gait and mobility: Secondary | ICD-10-CM | POA: Diagnosis not present

## 2017-09-03 DIAGNOSIS — M6281 Muscle weakness (generalized): Secondary | ICD-10-CM | POA: Diagnosis not present

## 2017-09-11 DIAGNOSIS — M6281 Muscle weakness (generalized): Secondary | ICD-10-CM | POA: Diagnosis not present

## 2017-09-11 DIAGNOSIS — R2689 Other abnormalities of gait and mobility: Secondary | ICD-10-CM | POA: Diagnosis not present

## 2017-09-11 DIAGNOSIS — Z7409 Other reduced mobility: Secondary | ICD-10-CM | POA: Diagnosis not present

## 2017-09-11 DIAGNOSIS — I69359 Hemiplegia and hemiparesis following cerebral infarction affecting unspecified side: Secondary | ICD-10-CM | POA: Diagnosis not present

## 2017-09-11 DIAGNOSIS — G919 Hydrocephalus, unspecified: Secondary | ICD-10-CM | POA: Diagnosis not present

## 2017-09-12 DIAGNOSIS — Z7409 Other reduced mobility: Secondary | ICD-10-CM | POA: Diagnosis not present

## 2017-09-12 DIAGNOSIS — G919 Hydrocephalus, unspecified: Secondary | ICD-10-CM | POA: Diagnosis not present

## 2017-09-12 DIAGNOSIS — I69359 Hemiplegia and hemiparesis following cerebral infarction affecting unspecified side: Secondary | ICD-10-CM | POA: Diagnosis not present

## 2017-09-12 DIAGNOSIS — M6281 Muscle weakness (generalized): Secondary | ICD-10-CM | POA: Diagnosis not present

## 2017-09-12 DIAGNOSIS — R2689 Other abnormalities of gait and mobility: Secondary | ICD-10-CM | POA: Diagnosis not present

## 2017-09-13 DIAGNOSIS — R739 Hyperglycemia, unspecified: Secondary | ICD-10-CM | POA: Diagnosis not present

## 2017-09-13 DIAGNOSIS — I69359 Hemiplegia and hemiparesis following cerebral infarction affecting unspecified side: Secondary | ICD-10-CM | POA: Diagnosis not present

## 2017-09-13 DIAGNOSIS — I10 Essential (primary) hypertension: Secondary | ICD-10-CM | POA: Diagnosis not present

## 2017-09-13 DIAGNOSIS — E7800 Pure hypercholesterolemia, unspecified: Secondary | ICD-10-CM | POA: Diagnosis not present

## 2017-09-18 DIAGNOSIS — Z7409 Other reduced mobility: Secondary | ICD-10-CM | POA: Diagnosis not present

## 2017-09-18 DIAGNOSIS — G919 Hydrocephalus, unspecified: Secondary | ICD-10-CM | POA: Diagnosis not present

## 2017-09-18 DIAGNOSIS — R2689 Other abnormalities of gait and mobility: Secondary | ICD-10-CM | POA: Diagnosis not present

## 2017-09-18 DIAGNOSIS — I69359 Hemiplegia and hemiparesis following cerebral infarction affecting unspecified side: Secondary | ICD-10-CM | POA: Diagnosis not present

## 2017-09-18 DIAGNOSIS — M6281 Muscle weakness (generalized): Secondary | ICD-10-CM | POA: Diagnosis not present

## 2017-09-20 DIAGNOSIS — I610 Nontraumatic intracerebral hemorrhage in hemisphere, subcortical: Secondary | ICD-10-CM | POA: Diagnosis not present

## 2017-09-20 DIAGNOSIS — R413 Other amnesia: Secondary | ICD-10-CM | POA: Diagnosis not present

## 2017-09-20 DIAGNOSIS — M6281 Muscle weakness (generalized): Secondary | ICD-10-CM | POA: Diagnosis not present

## 2017-09-20 DIAGNOSIS — Z7409 Other reduced mobility: Secondary | ICD-10-CM | POA: Diagnosis not present

## 2017-09-20 DIAGNOSIS — G919 Hydrocephalus, unspecified: Secondary | ICD-10-CM | POA: Diagnosis not present

## 2017-09-20 DIAGNOSIS — I69359 Hemiplegia and hemiparesis following cerebral infarction affecting unspecified side: Secondary | ICD-10-CM | POA: Diagnosis not present

## 2017-09-20 DIAGNOSIS — R2689 Other abnormalities of gait and mobility: Secondary | ICD-10-CM | POA: Diagnosis not present

## 2017-09-23 DIAGNOSIS — R2689 Other abnormalities of gait and mobility: Secondary | ICD-10-CM | POA: Diagnosis not present

## 2017-09-23 DIAGNOSIS — G919 Hydrocephalus, unspecified: Secondary | ICD-10-CM | POA: Diagnosis not present

## 2017-09-23 DIAGNOSIS — M6281 Muscle weakness (generalized): Secondary | ICD-10-CM | POA: Diagnosis not present

## 2017-09-23 DIAGNOSIS — Z7409 Other reduced mobility: Secondary | ICD-10-CM | POA: Diagnosis not present

## 2017-09-23 DIAGNOSIS — I69359 Hemiplegia and hemiparesis following cerebral infarction affecting unspecified side: Secondary | ICD-10-CM | POA: Diagnosis not present

## 2017-09-25 DIAGNOSIS — R2689 Other abnormalities of gait and mobility: Secondary | ICD-10-CM | POA: Diagnosis not present

## 2017-09-25 DIAGNOSIS — I69359 Hemiplegia and hemiparesis following cerebral infarction affecting unspecified side: Secondary | ICD-10-CM | POA: Diagnosis not present

## 2017-09-25 DIAGNOSIS — M6281 Muscle weakness (generalized): Secondary | ICD-10-CM | POA: Diagnosis not present

## 2017-09-25 DIAGNOSIS — Z7409 Other reduced mobility: Secondary | ICD-10-CM | POA: Diagnosis not present

## 2017-09-25 DIAGNOSIS — G919 Hydrocephalus, unspecified: Secondary | ICD-10-CM | POA: Diagnosis not present

## 2017-09-27 DIAGNOSIS — I6782 Cerebral ischemia: Secondary | ICD-10-CM | POA: Diagnosis not present

## 2017-09-27 DIAGNOSIS — Z7409 Other reduced mobility: Secondary | ICD-10-CM | POA: Diagnosis not present

## 2017-09-27 DIAGNOSIS — R2689 Other abnormalities of gait and mobility: Secondary | ICD-10-CM | POA: Diagnosis not present

## 2017-09-27 DIAGNOSIS — M6281 Muscle weakness (generalized): Secondary | ICD-10-CM | POA: Diagnosis not present

## 2017-09-27 DIAGNOSIS — I69359 Hemiplegia and hemiparesis following cerebral infarction affecting unspecified side: Secondary | ICD-10-CM | POA: Diagnosis not present

## 2017-09-27 DIAGNOSIS — G919 Hydrocephalus, unspecified: Secondary | ICD-10-CM | POA: Diagnosis not present

## 2017-10-02 DIAGNOSIS — I69359 Hemiplegia and hemiparesis following cerebral infarction affecting unspecified side: Secondary | ICD-10-CM | POA: Diagnosis not present

## 2017-10-02 DIAGNOSIS — Z7409 Other reduced mobility: Secondary | ICD-10-CM | POA: Diagnosis not present

## 2017-10-02 DIAGNOSIS — R2689 Other abnormalities of gait and mobility: Secondary | ICD-10-CM | POA: Diagnosis not present

## 2017-10-02 DIAGNOSIS — M6281 Muscle weakness (generalized): Secondary | ICD-10-CM | POA: Diagnosis not present

## 2017-10-02 DIAGNOSIS — G919 Hydrocephalus, unspecified: Secondary | ICD-10-CM | POA: Diagnosis not present

## 2017-10-07 ENCOUNTER — Encounter: Payer: Self-pay | Admitting: Internal Medicine

## 2017-10-07 DIAGNOSIS — I69359 Hemiplegia and hemiparesis following cerebral infarction affecting unspecified side: Secondary | ICD-10-CM | POA: Diagnosis not present

## 2017-10-07 DIAGNOSIS — M6281 Muscle weakness (generalized): Secondary | ICD-10-CM | POA: Diagnosis not present

## 2017-10-07 DIAGNOSIS — Z7409 Other reduced mobility: Secondary | ICD-10-CM | POA: Diagnosis not present

## 2017-10-07 DIAGNOSIS — R2689 Other abnormalities of gait and mobility: Secondary | ICD-10-CM | POA: Diagnosis not present

## 2017-10-07 DIAGNOSIS — G919 Hydrocephalus, unspecified: Secondary | ICD-10-CM | POA: Diagnosis not present

## 2017-10-10 DIAGNOSIS — Z7409 Other reduced mobility: Secondary | ICD-10-CM | POA: Diagnosis not present

## 2017-10-10 DIAGNOSIS — I69359 Hemiplegia and hemiparesis following cerebral infarction affecting unspecified side: Secondary | ICD-10-CM | POA: Diagnosis not present

## 2017-10-10 DIAGNOSIS — R2689 Other abnormalities of gait and mobility: Secondary | ICD-10-CM | POA: Diagnosis not present

## 2017-10-10 DIAGNOSIS — M6281 Muscle weakness (generalized): Secondary | ICD-10-CM | POA: Diagnosis not present

## 2017-10-10 DIAGNOSIS — G919 Hydrocephalus, unspecified: Secondary | ICD-10-CM | POA: Diagnosis not present

## 2017-10-16 DIAGNOSIS — G919 Hydrocephalus, unspecified: Secondary | ICD-10-CM | POA: Diagnosis not present

## 2017-10-16 DIAGNOSIS — Z7409 Other reduced mobility: Secondary | ICD-10-CM | POA: Diagnosis not present

## 2017-10-16 DIAGNOSIS — Z6832 Body mass index (BMI) 32.0-32.9, adult: Secondary | ICD-10-CM | POA: Diagnosis not present

## 2017-10-16 DIAGNOSIS — R2689 Other abnormalities of gait and mobility: Secondary | ICD-10-CM | POA: Diagnosis not present

## 2017-10-16 DIAGNOSIS — I69359 Hemiplegia and hemiparesis following cerebral infarction affecting unspecified side: Secondary | ICD-10-CM | POA: Diagnosis not present

## 2017-10-16 DIAGNOSIS — M6281 Muscle weakness (generalized): Secondary | ICD-10-CM | POA: Diagnosis not present

## 2017-10-18 DIAGNOSIS — I69359 Hemiplegia and hemiparesis following cerebral infarction affecting unspecified side: Secondary | ICD-10-CM | POA: Diagnosis not present

## 2017-10-18 DIAGNOSIS — G919 Hydrocephalus, unspecified: Secondary | ICD-10-CM | POA: Diagnosis not present

## 2017-10-18 DIAGNOSIS — Z7409 Other reduced mobility: Secondary | ICD-10-CM | POA: Diagnosis not present

## 2017-10-18 DIAGNOSIS — R2689 Other abnormalities of gait and mobility: Secondary | ICD-10-CM | POA: Diagnosis not present

## 2017-10-18 DIAGNOSIS — M6281 Muscle weakness (generalized): Secondary | ICD-10-CM | POA: Diagnosis not present

## 2017-10-24 DIAGNOSIS — I69359 Hemiplegia and hemiparesis following cerebral infarction affecting unspecified side: Secondary | ICD-10-CM | POA: Diagnosis not present

## 2017-10-24 DIAGNOSIS — G919 Hydrocephalus, unspecified: Secondary | ICD-10-CM | POA: Diagnosis not present

## 2017-10-24 DIAGNOSIS — R2689 Other abnormalities of gait and mobility: Secondary | ICD-10-CM | POA: Diagnosis not present

## 2017-10-24 DIAGNOSIS — M6281 Muscle weakness (generalized): Secondary | ICD-10-CM | POA: Diagnosis not present

## 2017-10-24 DIAGNOSIS — Z7409 Other reduced mobility: Secondary | ICD-10-CM | POA: Diagnosis not present

## 2017-10-28 DIAGNOSIS — Z7409 Other reduced mobility: Secondary | ICD-10-CM | POA: Diagnosis not present

## 2017-10-28 DIAGNOSIS — M6281 Muscle weakness (generalized): Secondary | ICD-10-CM | POA: Diagnosis not present

## 2017-10-28 DIAGNOSIS — G919 Hydrocephalus, unspecified: Secondary | ICD-10-CM | POA: Diagnosis not present

## 2017-10-28 DIAGNOSIS — R2689 Other abnormalities of gait and mobility: Secondary | ICD-10-CM | POA: Diagnosis not present

## 2017-10-28 DIAGNOSIS — I69359 Hemiplegia and hemiparesis following cerebral infarction affecting unspecified side: Secondary | ICD-10-CM | POA: Diagnosis not present

## 2017-10-30 DIAGNOSIS — I69359 Hemiplegia and hemiparesis following cerebral infarction affecting unspecified side: Secondary | ICD-10-CM | POA: Diagnosis not present

## 2017-10-30 DIAGNOSIS — Z7409 Other reduced mobility: Secondary | ICD-10-CM | POA: Diagnosis not present

## 2017-10-30 DIAGNOSIS — R2689 Other abnormalities of gait and mobility: Secondary | ICD-10-CM | POA: Diagnosis not present

## 2017-10-30 DIAGNOSIS — G919 Hydrocephalus, unspecified: Secondary | ICD-10-CM | POA: Diagnosis not present

## 2017-10-30 DIAGNOSIS — M6281 Muscle weakness (generalized): Secondary | ICD-10-CM | POA: Diagnosis not present

## 2017-11-04 DIAGNOSIS — R2689 Other abnormalities of gait and mobility: Secondary | ICD-10-CM | POA: Diagnosis not present

## 2017-11-04 DIAGNOSIS — I69359 Hemiplegia and hemiparesis following cerebral infarction affecting unspecified side: Secondary | ICD-10-CM | POA: Diagnosis not present

## 2017-11-04 DIAGNOSIS — M6281 Muscle weakness (generalized): Secondary | ICD-10-CM | POA: Diagnosis not present

## 2017-11-04 DIAGNOSIS — Z7409 Other reduced mobility: Secondary | ICD-10-CM | POA: Diagnosis not present

## 2017-11-04 DIAGNOSIS — G919 Hydrocephalus, unspecified: Secondary | ICD-10-CM | POA: Diagnosis not present

## 2018-01-06 DIAGNOSIS — G919 Hydrocephalus, unspecified: Secondary | ICD-10-CM | POA: Diagnosis not present

## 2018-01-06 DIAGNOSIS — R51 Headache: Secondary | ICD-10-CM | POA: Diagnosis not present

## 2018-01-06 DIAGNOSIS — Z982 Presence of cerebrospinal fluid drainage device: Secondary | ICD-10-CM | POA: Diagnosis not present

## 2018-02-11 DIAGNOSIS — R413 Other amnesia: Secondary | ICD-10-CM | POA: Diagnosis not present

## 2018-02-13 ENCOUNTER — Other Ambulatory Visit: Payer: Self-pay | Admitting: Internal Medicine

## 2018-02-13 DIAGNOSIS — I10 Essential (primary) hypertension: Secondary | ICD-10-CM

## 2018-02-13 DIAGNOSIS — I2510 Atherosclerotic heart disease of native coronary artery without angina pectoris: Secondary | ICD-10-CM

## 2018-02-13 DIAGNOSIS — I5032 Chronic diastolic (congestive) heart failure: Secondary | ICD-10-CM

## 2018-02-13 DIAGNOSIS — E876 Hypokalemia: Secondary | ICD-10-CM

## 2018-02-13 DIAGNOSIS — E785 Hyperlipidemia, unspecified: Secondary | ICD-10-CM

## 2018-03-24 ENCOUNTER — Encounter (HOSPITAL_COMMUNITY): Payer: Self-pay | Admitting: Emergency Medicine

## 2018-03-24 ENCOUNTER — Emergency Department (HOSPITAL_COMMUNITY): Payer: Medicare HMO

## 2018-03-24 ENCOUNTER — Emergency Department (HOSPITAL_COMMUNITY)
Admission: EM | Admit: 2018-03-24 | Discharge: 2018-03-24 | Disposition: A | Discharge status: Home / Self Care | Payer: Medicare HMO | Attending: Emergency Medicine | Admitting: Emergency Medicine

## 2018-03-24 DIAGNOSIS — I5030 Unspecified diastolic (congestive) heart failure: Secondary | ICD-10-CM | POA: Insufficient documentation

## 2018-03-24 DIAGNOSIS — I10 Essential (primary) hypertension: Secondary | ICD-10-CM | POA: Diagnosis not present

## 2018-03-24 DIAGNOSIS — Z7982 Long term (current) use of aspirin: Secondary | ICD-10-CM | POA: Diagnosis not present

## 2018-03-24 DIAGNOSIS — R4182 Altered mental status, unspecified: Secondary | ICD-10-CM | POA: Diagnosis not present

## 2018-03-24 DIAGNOSIS — Z79899 Other long term (current) drug therapy: Secondary | ICD-10-CM | POA: Insufficient documentation

## 2018-03-24 DIAGNOSIS — E119 Type 2 diabetes mellitus without complications: Secondary | ICD-10-CM | POA: Diagnosis not present

## 2018-03-24 DIAGNOSIS — J811 Chronic pulmonary edema: Secondary | ICD-10-CM | POA: Diagnosis not present

## 2018-03-24 DIAGNOSIS — R410 Disorientation, unspecified: Secondary | ICD-10-CM | POA: Insufficient documentation

## 2018-03-24 DIAGNOSIS — I110 Hypertensive heart disease with heart failure: Secondary | ICD-10-CM | POA: Diagnosis not present

## 2018-03-24 DIAGNOSIS — Z955 Presence of coronary angioplasty implant and graft: Secondary | ICD-10-CM | POA: Diagnosis not present

## 2018-03-24 DIAGNOSIS — Z87891 Personal history of nicotine dependence: Secondary | ICD-10-CM | POA: Diagnosis not present

## 2018-03-24 DIAGNOSIS — R402441 Other coma, without documented Glasgow coma scale score, or with partial score reported, in the field [EMT or ambulance]: Secondary | ICD-10-CM | POA: Diagnosis not present

## 2018-03-24 DIAGNOSIS — R4020 Unspecified coma: Secondary | ICD-10-CM | POA: Diagnosis not present

## 2018-03-24 DIAGNOSIS — I2510 Atherosclerotic heart disease of native coronary artery without angina pectoris: Secondary | ICD-10-CM | POA: Insufficient documentation

## 2018-03-24 LAB — URINALYSIS, COMPLETE (UACMP) WITH MICROSCOPIC
Bilirubin Urine: NEGATIVE
Glucose, UA: NEGATIVE mg/dL
Ketones, ur: NEGATIVE mg/dL
Nitrite: NEGATIVE
Protein, ur: 100 mg/dL — AB
Specific Gravity, Urine: 1.019 (ref 1.005–1.030)
Squamous Epithelial / LPF: NONE SEEN
pH: 6 (ref 5.0–8.0)

## 2018-03-24 LAB — COMPREHENSIVE METABOLIC PANEL
ALT: 14 U/L — ABNORMAL LOW (ref 17–63)
AST: 41 U/L (ref 15–41)
Albumin: 3.9 g/dL (ref 3.5–5.0)
Alkaline Phosphatase: 110 U/L (ref 38–126)
Anion gap: 8 (ref 5–15)
BUN: 18 mg/dL (ref 6–20)
CO2: 26 mmol/L (ref 22–32)
Calcium: 9.1 mg/dL (ref 8.9–10.3)
Chloride: 105 mmol/L (ref 101–111)
Creatinine, Ser: 1.44 mg/dL — ABNORMAL HIGH (ref 0.61–1.24)
GFR calc Af Amer: >60 mL/min (ref >60)
GFR calc non Af Amer: 54 mL/min — ABNORMAL LOW (ref >60)
Glucose, Bld: 111 mg/dL — ABNORMAL HIGH (ref 65–99)
Potassium: 5.7 mmol/L — ABNORMAL HIGH (ref 3.5–5.1)
Sodium: 139 mmol/L (ref 135–145)
Total Bilirubin: 1.7 mg/dL — ABNORMAL HIGH (ref 0.3–1.2)
Total Protein: 7.1 g/dL (ref 6.5–8.1)

## 2018-03-24 LAB — ETHANOL: Alcohol, Ethyl (B): <10 mg/dL (ref <10)

## 2018-03-24 LAB — APTT: aPTT: 32 seconds (ref 24–36)

## 2018-03-24 LAB — CBC WITH DIFFERENTIAL/PLATELET
Basophils Absolute: 0 10*3/uL (ref 0.0–0.1)
Basophils Relative: 0 %
Eosinophils Absolute: 0.1 10*3/uL (ref 0.0–0.7)
Eosinophils Relative: 1 %
HCT: 44.1 % (ref 39.0–52.0)
Hemoglobin: 14.5 g/dL (ref 13.0–17.0)
Lymphocytes Relative: 12 %
Lymphs Abs: 1.5 10*3/uL (ref 0.7–4.0)
MCH: 30.9 pg (ref 26.0–34.0)
MCHC: 32.9 g/dL (ref 30.0–36.0)
MCV: 93.8 fL (ref 78.0–100.0)
Monocytes Absolute: 0.4 10*3/uL (ref 0.1–1.0)
Monocytes Relative: 3 %
Neutro Abs: 10.7 10*3/uL — ABNORMAL HIGH (ref 1.7–7.7)
Neutrophils Relative %: 84 %
Platelets: 288 10*3/uL (ref 150–400)
RBC: 4.7 MIL/uL (ref 4.22–5.81)
RDW: 13 % (ref 11.5–15.5)
WBC: 12.7 10*3/uL — ABNORMAL HIGH (ref 4.0–10.5)

## 2018-03-24 LAB — RAPID URINE DRUG SCREEN, HOSP PERFORMED
Amphetamines: NOT DETECTED
Barbiturates: NOT DETECTED
Benzodiazepines: NOT DETECTED
Cocaine: NOT DETECTED
Opiates: NOT DETECTED
Tetrahydrocannabinol: NOT DETECTED

## 2018-03-24 LAB — AMMONIA: Ammonia: 24 umol/L (ref 9–35)

## 2018-03-24 LAB — PROTIME-INR
INR: 1.03
Prothrombin Time: 13.4 seconds (ref 11.4–15.2)

## 2018-03-24 LAB — BASIC METABOLIC PANEL
Anion gap: 8 (ref 5–15)
BUN: 17 mg/dL (ref 6–20)
CO2: 27 mmol/L (ref 22–32)
Calcium: 9 mg/dL (ref 8.9–10.3)
Chloride: 105 mmol/L (ref 101–111)
Creatinine, Ser: 1.29 mg/dL — ABNORMAL HIGH (ref 0.61–1.24)
GFR calc Af Amer: >60 mL/min (ref >60)
GFR calc non Af Amer: >60 mL/min (ref >60)
Glucose, Bld: 99 mg/dL (ref 65–99)
Potassium: 3.4 mmol/L — ABNORMAL LOW (ref 3.5–5.1)
Sodium: 140 mmol/L (ref 135–145)

## 2018-03-24 LAB — I-STAT CHEM 8, ED
BUN: 18 mg/dL (ref 6–20)
CHLORIDE: 103 mmol/L (ref 101–111)
CREATININE: 1.2 mg/dL (ref 0.61–1.24)
Calcium, Ion: 1.15 mmol/L (ref 1.15–1.40)
GLUCOSE: 89 mg/dL (ref 65–99)
HCT: 40 % (ref 39.0–52.0)
Hemoglobin: 13.6 g/dL (ref 13.0–17.0)
POTASSIUM: 3.3 mmol/L — AB (ref 3.5–5.1)
Sodium: 144 mmol/L (ref 135–145)
TCO2: 29 mmol/L (ref 22–32)

## 2018-03-24 LAB — I-STAT TROPONIN, ED: Troponin i, poc: 0.03 ng/mL (ref 0.00–0.08)

## 2018-03-24 MED ORDER — SODIUM CHLORIDE 0.9 % IV BOLUS
1000.0000 mL | Freq: Once | INTRAVENOUS | Status: AC
  Administered 2018-03-24: 1000 mL via INTRAVENOUS

## 2018-03-24 MED ORDER — SODIUM CHLORIDE 0.9 % IV BOLUS: 1000.0000 mL | Freq: Once | INTRAVENOUS | Status: DC

## 2018-03-24 NOTE — Discharge Instructions (Signed)
Stay hydrated.   Take your medicines as prescribed.   See your doctor.   Return to ER if he has worse confusion, lethargy, weakness.

## 2018-03-24 NOTE — ED Triage Notes (Signed)
Per [facility] patient coming from home after family found patient down in the front yard. Unaware if patient fell. Not on thinners. Hx of hemorrhagic stroke in 2015 with left sided residual weakness. [facility]ms reports no neuro deficits, except left sided gaze briefly, and eyes unable to raise above midline. Patient alert to self and place. Disoriented to situation and time.

## 2018-03-24 NOTE — ED Provider Notes (Signed)
[place] [facility] EMERGENCY DEPARTMENT Provider Note   CSN: [id number] Arrival date & time: 03/24/18  1242     History   Chief Complaint Chief Complaint  Patient presents with  . Altered Mental Status    HPI Tony Davis. is a 57 y.o. male with history of 3 strokes with left-sided deficit as well as cognitive deficit who presents following episode of altered mental status.  Patient was at home and wandered out of the house when his son went to the store quickly, and found down in the yard.  It was unknown how long he had been out there, however son reports he was normal for he left.  Patient's wife saw him normal at 46 AM.  Patient's wife arrives after EMS and reports that he was having trouble focusing and slow to respond.  She reports she seems to be back to his normal self in the emergency department.  The patient denies any pain.  He does not remember what happened.  HPI  Past Medical History:  Diagnosis Date  . Acute respiratory failure ([place]) 2015   a. Following BG hemorrhage - s/p trache with decannulation.  . Arthritis    "knees" (09/25/2013)  . Basal ganglia hemorrhage ([place]) 2015   a. 2015 - with intraventricular extension and hydrocephalus requiring right crani and VP shunt, complicated by encephalopathy, protein calorie malnutrition, acute resp failure req trache and PEG, BLE DVT s/p IVC, enterobacter PNA, subsequent right hemiparesis and difficulty communicating.  Marland Kitchen BPH (benign prostatic hyperplasia)   . CAD (coronary artery disease)    a. s/p promus DES circumflex artery 10/13/10. b. Cath 04/2013: stable disease but possible small vessel disease -Imdur added.  . Carotid artery disease ([place])    a. 1-29% BICA by study 04/2014.  Marland Kitchen CVA (cerebral vascular accident) ([place]) 02/2010   a. CVA 2011, prior reported TIAs. b. CNS hemorrhage 2015.  Marland Kitchen CVA (cerebral vascular accident) ([place]) 06/2014   "hemorrhagic; in [place]; brain activity diminshed; ability to do  anything; can't do ADLs on own"  . DVT (deep venous thrombosis) ([place]) 2013   a. 2013. b. 2015 - reported BLE DVT s/p IVC filter.   . Enterobacter cloacae pneumonia ([place]) 2015  . [place]   . Folate deficiency anemia   . GERD (gastroesophageal reflux disease)   . Hematuria 8/12  . HLD (hyperlipidemia)   . HTN (hypertension)   . Hydrocephalus 2015   a. 2/2 BG hemorrhage.  . Incontinence of feces   . LV dysfunction    a. 2011 - EF 45-50%, subsequent 55-60% in 04/2014.  Marland Kitchen Noncompliance   . OSA on CPAP    "not using CPAP right now" (11/30/2014)  . PFO (patent foramen ovale)    a. echo 8/12: EF 55-65%, grade 2 diast dysfnx; +PFO on bubble study. b. TEE 4/13: EF normal, atrial septum with suspicion for interatrial septum fenestrations without flow across and few large bubbles noted in LA.  This was not felt to require closure   . Protein calorie malnutrition ([place])   . Rheumatoid arthritis(714.0)   . S/P percutaneous endoscopic gastrostomy (PEG) tube placement ([place]) 2015  . TIA (transient ischemic attack)    "1-2; both after the one in 02/2010" (09/25/2013)  . Type II diabetes mellitus ([place])   . Urinary incontinence     Patient Active Problem List   Diagnosis Date Noted  . Hypokalemia 12/26/2015  . Depression 02/03/2015  . Diastolic dysfunction with chronic heart failure ([place]) 11/30/2014  .  Essential hypertension 11/03/2014  . LV dysfunction   . Hydrocephalus   . Basal ganglia hemorrhage ([place]) 07/30/2014  . Routine general medical examination at a health care facility 02/11/2014  . Insomnia, persistent 03/25/2013  . Chronic rhinitis 03/25/2013  . Hemiparesis due to old cerebral infarction ([place]) 03/20/2013  . Folate deficiency anemia 10/13/2012  . Long term (current) use of anticoagulants 03/28/2012  . Obstructive sleep apnea 08/16/2011  . Prediabetes 08/06/2011  . PATENT FORAMEN OVALE 03/13/2010  . CAD, NATIVE VESSEL 01/26/2010  . Hyperlipidemia with target LDL less than 100  01/23/2010  . GERD 01/23/2010    Past Surgical History:  Procedure Laterality Date  . CORONARY ANGIOPLASTY WITH STENT PLACEMENT  2012   "1" (09/24/2013)  . KNEE ARTHROSCOPY Bilateral 2000's   2 on left and 3 on rt  . LEFT HEART CATHETERIZATION WITH CORONARY ANGIOGRAM N/A 04/24/2013   Procedure: LEFT HEART CATHETERIZATION WITH CORONARY ANGIOGRAM;  Surgeon: Burnell Blanks, MD;  Location: [facility] CATH LAB;  Service: Cardiovascular;  Laterality: N/A;  . TEE WITHOUT CARDIOVERSION  03/21/2012   Procedure: TRANSESOPHAGEAL ECHOCARDIOGRAM (TEE);  Surgeon: Fay Records, MD;  Location: [facility] ENDOSCOPY;  Service: Cardiovascular;  Laterality: N/A;        Home Medications    Prior to Admission medications   Medication Sig Start Date End Date Taking? Authorizing Provider  aspirin EC 81 MG tablet Take 81 mg by mouth daily.   Yes [provider]  atorvastatin (LIPITOR) 80 MG tablet TAKE ONE TABLET BY MOUTH DAILY 02/13/18  Yes Janith Lima, MD  escitalopram (LEXAPRO) 10 MG tablet Take 1 tablet (10 mg total) by mouth daily. Patient taking differently: Take 20 mg by mouth daily.  07/19/17  Yes Janith Lima, MD  lisinopril (PRINIVIL,ZESTRIL) 20 MG tablet Take 1 tablet (20 mg total) by mouth daily. Patient taking differently: Take 40 mg by mouth daily.  07/19/17  Yes Janith Lima, MD  metoprolol succinate (TOPROL-XL) 50 MG 24 hr tablet TAKE ONE AND ONE-HALF (1  1/2) TABLETS BY MOUTH TWO TIMES A DAY WITH OR IMMEDIATELY FOLLOWING A MEAL Patient taking differently: Take 75 mg by mouth 2 (two) times daily.  02/13/18  Yes Janith Lima, MD  potassium chloride (K-DUR) 10 MEQ tablet TAKE ONE TABLET BY MOUTH DAILY 02/13/18  Yes Janith Lima, MD  atorvastatin (LIPITOR) 40 MG tablet TAKE ONE TABLET BY MOUTH DAILY Patient not taking: Reported on 03/24/2018 07/28/17   Janith Lima, MD    Family History Family History  Problem Relation Age of Onset  . Heart attack Father   . Stroke Father   .  Heart attack Sister   . Hypertension Brother   . Seizures Sister   . Stroke Sister   . Cancer Neg Hx   . Kidney disease Neg Hx   . Diabetes Neg Hx     Social History Social History   Tobacco Use  . Smoking status: Former Smoker    Packs/day: 1.00    Years: 35.00    Pack years: 35.00    Types: Cigarettes    Last attempt to quit: 03/10/2012    Years since quitting: 6.0  . Smokeless tobacco: Never Used  Substance Use Topics  . Alcohol use: No    Alcohol/week: 0.0 oz  . Drug use: No     Allergies   Iodine and Amlodipine   Review of Systems Review of Systems  Constitutional: Negative for chills and fever.  HENT: Negative  for facial swelling and sore throat.   Respiratory: Negative for shortness of breath.   Cardiovascular: Negative for chest pain.  Gastrointestinal: Negative for abdominal pain, nausea and vomiting.  Genitourinary: Negative for dysuria.  Musculoskeletal: Negative for back pain.  Skin: Negative for rash and wound.  Neurological: Positive for syncope (?). Negative for headaches.  Psychiatric/Behavioral: The patient is not nervous/anxious.      Physical Exam Updated Vital Signs BP (!) 147/85   Pulse 78   Temp (!) 97.4 F (36.3 C) (Oral)   Resp 15   Ht 6' (1.829 m)   Wt 113.4 kg (250 lb)   SpO2 93%   BMI 33.91 kg/m   Physical Exam  Constitutional: He appears well-developed and well-nourished. No distress.  HENT:  Head: Normocephalic and atraumatic.  Mouth/Throat: Oropharynx is clear and moist. No oropharyngeal exudate.  Eyes: Pupils are equal, round, and reactive to light. Conjunctivae and EOM are normal. Right eye exhibits no discharge. Left eye exhibits no discharge. No scleral icterus.  Neck: Normal range of motion. Neck supple. No thyromegaly present.  Cardiovascular: Normal rate, regular rhythm, normal heart sounds and intact distal pulses. Exam reveals no gallop and no friction rub.  No murmur heard. Pulmonary/Chest: Effort normal and  breath sounds normal. No stridor. No respiratory distress. He has no wheezes. He has no rales.  Abdominal: Soft. Bowel sounds are normal. He exhibits no distension. There is no tenderness. There is no rebound and no guarding.  Musculoskeletal: He exhibits no edema.  No midline cervical, thoracic, or lumbar tenderness  Lymphadenopathy:    He has no cervical adenopathy.  Neurological: He is alert. Coordination normal.  CN 3-12 intact; normal sensation throughout; 5/5 strength in all 4 extremities; equal bilateral grip strength; no ataxia on finger-to-nose; oriented to self and DOB, not place or time (baseline for the patient since strokes, per wife)  Skin: Skin is warm and dry. No rash noted. He is not diaphoretic. No pallor.  Psychiatric: He has a normal mood and affect.  Nursing note and vitals reviewed.    ED Treatments / Results  Labs (all labs ordered are listed, but only abnormal results are displayed) Labs Reviewed  COMPREHENSIVE METABOLIC PANEL - Abnormal; Notable for the following components:      Result Value   Potassium 5.7 (*)    Glucose, Bld 111 (*)    Creatinine, Ser 1.44 (*)    ALT 14 (*)    Total Bilirubin 1.7 (*)    GFR calc non Af Amer 54 (*)    All other components within normal limits  CBC WITH DIFFERENTIAL/PLATELET - Abnormal; Notable for the following components:   WBC 12.7 (*)    Neutro Abs 10.7 (*)    All other components within normal limits  AMMONIA  ETHANOL  PROTIME-INR  APTT  URINALYSIS, COMPLETE (UACMP) WITH MICROSCOPIC  RAPID URINE DRUG SCREEN, HOSP PERFORMED  BASIC METABOLIC PANEL  I-STAT TROPONIN, ED  CBG MONITORING, ED    EKG EKG Interpretation  Date/Time:  Monday March 24 2018 15:35:11 EDT Ventricular Rate:  69 PR Interval:    QRS Duration: 94 QT Interval:  422 QTC Calculation: 453 R Axis:   3 Text Interpretation:  Sinus rhythm Borderline T abnormalities, inferior leads Confirmed by Lajean Saver [phone]) on 03/24/2018 3:43:45  PM   Radiology Ct Head Wo Contrast  Result Date: 03/24/2018 CLINICAL DATA:  Altered level of consciousness EXAM: CT HEAD WITHOUT CONTRAST TECHNIQUE: Contiguous axial images were obtained from the base  of the skull through the vertex without intravenous contrast. COMPARISON:  11/28/2015 FINDINGS: Brain: No evidence of acute infarction, hemorrhage, extra-axial collection, ventriculomegaly, or mass effect. Right frontal ventriculoperitoneal shunt catheter in unchanged position terminating in the right frontal horn. Ventricles are stable in size compared with the prior exam. Generalized cerebral atrophy. Periventricular white matter low attenuation likely secondary to microangiopathy. Vascular: Cerebrovascular atherosclerotic calcifications are noted. Skull: Negative for fracture or focal lesion. Sinuses/Orbits: Visualized portions of the orbits are unremarkable. Visualized portions of the paranasal sinuses and mastoid air cells are unremarkable. Other: None. IMPRESSION: No acute intracranial pathology. Electronically Signed   By: Kathreen Devoid   On: 03/24/2018 14:42    Procedures Procedures (including critical care time)  Medications Ordered in ED Medications  sodium chloride 0.9 % bolus 1,000 mL (1,000 mLs Intravenous New Bag/Given 03/24/18 1706)     Initial Impression / Assessment and Plan / ED Course  I have reviewed the triage vital signs and the nursing notes.  Pertinent labs & imaging results that were available during my care of the patient were reviewed by me and considered in my medical decision making (see chart for details).     Patient is at baseline according to patient's wife. CBC shows mild leukocytosis, 12.7.  CMP shows potassium of 5.7.  Will repeat.  Creatinine 1.44.  Will give a liter of NS.  Ammonia and ethanol negative.  CT head is negative for acute findings.  Urine with in and out cath is pending as well as repeat potassium. At shift change, patient care transferred to Dr.  Darl Householder for continued evaluation, follow up of repeat potassium and UA and determination of disposition.    Final Clinical Impressions(s) / ED Diagnoses   Final diagnoses:  Confusion    ED Discharge Orders    None       Frederica Kuster, PA-C 03/24/18 1709    Lajean Saver, MD 03/26/18 1247

## 2018-03-24 NOTE — ED Provider Notes (Signed)
  Physical Exam  BP (!) 164/106   Pulse 73   Temp (!) 97.4 F (36.3 C) (Oral)   Resp 14   Ht 6' (1.829 m)   Wt 113.4 kg (250 lb)   SpO2 98%   BMI 33.91 kg/m   Physical Exam  ED Course/Procedures     Procedures  MDM  Patient care assumed at 4 pm. Patient had previous strokes here with AMS. Back to baseline now. CT head unremarkable. Potassium hemolyzed. Sign out pending repeat BMP and hydration and UA.   8:32 PM Repeat labs unremarkable. UA showed no obvious UTI. Back to baseline. Stable for discharge.       Drenda Freeze, MD 03/24/18 2033

## 2018-03-24 NOTE — ED Notes (Signed)
Patient Alert and oriented to baseline. Stable and ambulatory to baseline. Patient verbalized understanding of the discharge instructions.  Patient belongings were taken by the patient.   

## 2018-04-17 ENCOUNTER — Encounter: Payer: Self-pay | Admitting: Physician Assistant

## 2018-04-17 NOTE — Progress Notes (Addendum)
Cardiology Office Note    Date:  04/18/2018  ID:  Tony Davis., DOB [birth year], MRN [id number] PCP:  Aletha Halim., PA-C  Cardiologist:  Angelena Form   Chief Complaint: overdue f/u multiple cardiac issues  History of Present Illness:  Tony Russom. is a 57 y.o. male with history of CAD (DES-Cx 2011, cath 04/2013 - stable, possible small vessel dz), CVAs/TIAs, HTN, HLD, OSA on CPAP, prior LV dysfunction, lower extremity edema (felt noncardiac), prior medical noncompliance, orthostatic hypotension, basal ganglia hemorrhage 2778 (complicated by intraventricular extension and hydrocephalus requiring right crani and VP shunt, encephalopathy, protein calorie malnutrition, acute resp failure req trache and PEG, BLE DVT s/p IVC filter, enterobacter PNA, anemia, subsequent right hemiparesis and difficulty communicating). He presents for overdue follow-up.  To recap cardiac history, Dr. Angelena Form met him in February 2011 for complaints of chest pain and dizziness. Carotid duplex was normal. He came in for his stress test and complained of neurological changes. He was seen by Dr. Lia Foyer and the stress test was cancelled. He was sent over to Dr. Stones office and had MRI/MRA of the head and neck. MRA showed no occlusive disease. MRI showed non-specific white matter changes but no clear infarct, possibly related to hypertension. His echo showed septal and inferobasal hypokinesis with mild concentric hypertrophy, EF 45-50%. Left heart cath October 2011 with severe stenosis in the mid Circumflex. PCI Circumflex with placement of Promus DES 10/13/10. Repeat echo August 2012: PFO with positive bubble study. He was referred to Dr. Burt Knack for PFO closure but he skipped his appointment. He was in [place] April 2013 and had acute onset of left arm and leg weakness and was admitted. CT head without acute stroke. Venous dopplers with chronic DVT right femoral vein. Carotids with <24% RICA stenosis, LICA ok.  Echo 03/14/12 in [place] showed normal LV size and function again with PFO. He was started on coumadin and Lovenox and left AMA from the hospital there. Dr. Angelena Form saw him in follow-up several days later. At that time, he continued to have left sided weakness. He had not been compliant with plans for Lovenox and coumadin. He was admitted for heparin and coumadin. TEE was done 03/21/12: EF normal, atrial septum with suspicion for interatrial septum fenestrations without flow across and few large bubbles noted in LA. This was not felt to require closure. Venous dopplers 2013 with no DVT. He was admitted to [facility] 04/22/13 with chest pain and negative enzymes. Cardiac cath 04/24/13 with stable CAD - patent stent distal Circumflex, mild disease LAD, moderate disease RCA. He was admitted to [facility] 04/14/14 after syncopal event felt to be due to increased Clonidine dose/orthostatic hypotension so clonidine was stopped. Norvasc was stopped in past due to edema. Last echo 11/2014 showed EF 65-70%, grade 2 DD, mildly dilated LA. In 2015 he was admitted for CNS hemorrhage and numerous complications from such as outlined above. LE duplex 08/13/14 showed "Bilateral - Age indeterminant thrombus coursing from the popliteal vein through the femoral and common femoral veins. There is a superficial thrombus of the saphenofemoral junction." He is not felt to be a candidate for anticoagulation due to his h/o CNS hemorrhage. I met him in f/u from that admission and it was extremely complicated visit due to inconsistency in med reconciliation. We spent almost an hour reconciling his medications. He was admitted to [facility] December 2015 with weakness and possible UTI. He was last seen by Dr. Angelena Form 05/2015 and Lasix was increased for edema.  In 11/2015 he was admitted for syncope. CT brain was negative along with EEG. There was evidence of AKI so he was treated with IV fluids. Lasix was decreased. Last labs 03/2018 (done during ER visit for  confusion) showed neg trop/UDS/etoh/ammonia, normal PT/INR, K 3.3, Cr 1.44, WBC 12.7, Hgb 14.5 during ER visit for confusion. He has since followed with a new neurologist.  He presents back for overdue follow-up accompanied by his wife. From a cardiac standpoint he has not had any interim complications. No chest pain or SOB. His edema is essentially resolved by frequent elevation of the legs. He is no longer on Lasix for unclear reasons, ? CKD. He does complain of chronic fatigue and lethargy, as well as elevated BP. He is no longer using CPAP but has been encouraged to have another sleep study by neurology a few months ago.   Past Medical History:  Diagnosis Date  . Acute respiratory failure ([place]) 2015   a. Following BG hemorrhage - s/p trache with decannulation.  . Arthritis    "knees" (09/25/2013)  . Basal ganglia hemorrhage ([place]) 2015   a. 2015 - with intraventricular extension and hydrocephalus requiring right crani and VP shunt, complicated by encephalopathy, protein calorie malnutrition, acute resp failure req trache and PEG, BLE DVT s/p IVC, enterobacter PNA, subsequent right hemiparesis and difficulty communicating.  Marland Kitchen BPH (benign prostatic hyperplasia)   . CAD (coronary artery disease)    a. s/p promus DES circumflex artery 10/13/10. b. Cath 04/2013: stable disease but possible small vessel disease -Imdur added.  . Carotid artery disease ([place])    a. 1-29% BICA by study 04/2014.  Marland Kitchen CVA (cerebral vascular accident) ([place]) 02/2010   a. CVA 2011, prior reported TIAs. b. CNS hemorrhage 2015.  Marland Kitchen CVA (cerebral vascular accident) ([place]) 06/2014   "hemorrhagic; in [place]; brain activity diminshed; ability to do anything; can't do ADLs on own"  . DVT (deep venous thrombosis) ([place]) 2013   a. 2013. b. 2015 - reported BLE DVT s/p IVC filter.   . Enterobacter cloacae pneumonia ([place]) 2015  . Falls   . Folate deficiency anemia   . GERD (gastroesophageal reflux disease)   . Hematuria 8/12  . HLD  (hyperlipidemia)   . HTN (hypertension)   . Hydrocephalus 2015   a. 2/2 BG hemorrhage.  . Incontinence of feces   . Lower extremity edema   . LV dysfunction    a. 2011 - EF 45-50%, subsequent 55-60% in 04/2014.  Marland Kitchen Noncompliance   . Orthostatic hypotension   . OSA on CPAP    "not using CPAP right now" (11/30/2014)  . PFO (patent foramen ovale)    a. echo 8/12: EF 55-65%, grade 2 diast dysfnx; +PFO on bubble study. b. TEE 4/13: EF normal, atrial septum with suspicion for interatrial septum fenestrations without flow across and few large bubbles noted in LA.  This was not felt to require closure   . Protein calorie malnutrition ([place])   . Rheumatoid arthritis(714.0)   . S/P percutaneous endoscopic gastrostomy (PEG) tube placement ([place]) 2015  . TIA (transient ischemic attack)    "1-2; both after the one in 02/2010" (09/25/2013)  . Type II diabetes mellitus ([place])   . Urinary incontinence     Past Surgical History:  Procedure Laterality Date  . CORONARY ANGIOPLASTY WITH STENT PLACEMENT  2012   "1" (09/24/2013)  . KNEE ARTHROSCOPY Bilateral 2000's   2 on left and 3 on rt  . LEFT HEART CATHETERIZATION WITH CORONARY ANGIOGRAM  N/A 04/24/2013   Procedure: LEFT HEART CATHETERIZATION WITH CORONARY ANGIOGRAM;  Surgeon: Burnell Blanks, MD;  Location: [facility] CATH LAB;  Service: Cardiovascular;  Laterality: N/A;  . TEE WITHOUT CARDIOVERSION  03/21/2012   Procedure: TRANSESOPHAGEAL ECHOCARDIOGRAM (TEE);  Surgeon: Fay Records, MD;  Location: [facility] ENDOSCOPY;  Service: Cardiovascular;  Laterality: N/A;    Current Medications: Current Meds  Medication Sig  . aspirin EC 81 MG tablet Take 81 mg by mouth daily.  Marland Kitchen atorvastatin (LIPITOR) 80 MG tablet TAKE ONE TABLET BY MOUTH DAILY  . escitalopram (LEXAPRO) 20 MG tablet Take 20 mg by mouth daily.  Marland Kitchen lisinopril (PRINIVIL,ZESTRIL) 40 MG tablet TAKE ONE TABLET BY MOUTH DAILY  . metoprolol succinate (TOPROL-XL) 50 MG 24 hr tablet TAKE ONE AND ONE-HALF (1   1/2) TABLETS BY MOUTH TWO TIMES A DAY WITH OR IMMEDIATELY FOLLOWING A MEAL  . potassium chloride (K-DUR) 10 MEQ tablet TAKE ONE TABLET BY MOUTH DAILY    Allergies:   Iodine and Amlodipine   Social History   Socioeconomic History  . Marital status: Married    Spouse name: Not on file  . Number of children: Not on file  . Years of education: Not on file  . Highest education level: Not on file  Occupational History  . Not on file  Social Needs  . Financial resource strain: Not on file  . Food insecurity:    Worry: Not on file    Inability: Not on file  . Transportation needs:    Medical: Not on file    Non-medical: Not on file  Tobacco Use  . Smoking status: Former Smoker    Packs/day: 1.00    Years: 35.00    Pack years: 35.00    Types: Cigarettes    Last attempt to quit: 03/10/2012    Years since quitting: 6.1  . Smokeless tobacco: Never Used  Substance and Sexual Activity  . Alcohol use: No    Alcohol/week: 0.0 oz  . Drug use: No  . Sexual activity: Not Currently    Partners: Female  Lifestyle  . Physical activity:    Days per week: Not on file    Minutes per session: Not on file  . Stress: Not on file  Relationships  . Social connections:    Talks on phone: Not on file    Gets together: Not on file    Attends religious service: Not on file    Active member of club or organization: Not on file    Attends meetings of clubs or organizations: Not on file    Relationship status: Not on file  Other Topics Concern  . Not on file  Social History Narrative   Married, 5 kids; lives in [place]. 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      Family History:  Family History  Problem Relation Age of Onset  . Heart attack Father   . Stroke Father   . Heart attack Sister   . Hypertension Brother   . Seizures Sister   . Stroke Sister   . Cancer Neg Hx   . Kidney disease Neg Hx   . Diabetes Neg Hx      ROS:   Please see the history of present illness.   All other systems are reviewed and otherwise negative.    PHYSICAL EXAM:   VS:  BP (!) 138/98   Pulse 73   Ht 5' 11" (1.803 m)   [facility]  249 lb 12.8 oz (113.3 kg)   SpO2 98%   BMI 34.84 kg/m   BMI: Body mass index is 34.84 kg/m. GEN: Chronically ill appearing obese AAM in no acute distress  HEENT: normocephalic, atraumatic. Mild exopthalmos Neck: no JVD, carotid bruits, or masses Cardiac: RRR; no murmurs, rubs, or gallops, no edema  Respiratory:  clear to auscultation bilaterally, normal work of breathing GI: soft, nontender, nondistended, + BS MS: no deformity or atrophy  Skin: warm and dry, no rash Neuro:  Alert and Oriented x 3, appears distant at times. Follows commands. R hemiparesis Psych: euthymic mood, full affect  Wt Readings from Last 3 Encounters:  04/18/18 249 lb 12.8 oz (113.3 kg)  03/24/18 250 lb (113.4 kg)  07/18/17 259 lb 8 oz (117.7 kg)      Studies/Labs Reviewed:   EKG:  EKG was ordered today and personally reviewed by me and demonstrates NSR 73bpm nonspecific TW changes similar to 2016  Recent Labs: 03/24/2018: ALT 14; BUN 18; Creatinine, Ser 1.20; Hemoglobin 13.6; Platelets 288; Potassium 3.3; Sodium 144   Lipid Panel    Component Value Date/Time   CHOL 225 (H) 07/09/2016 1654   TRIG 343.0 (H) 07/09/2016 1654   HDL 37.50 (L) 07/09/2016 1654   CHOLHDL 6 07/09/2016 1654   VLDL 68.6 (H) 07/09/2016 1654   LDLCALC 64 02/11/2014 1422   LDLDIRECT 145.0 07/09/2016 1654    Additional studies/ records that were reviewed today include: Summarized above.    ASSESSMENT & PLAN:   1. CAD - no recent anginal symptoms. Continue ASA, statin, BB. 2. Lower extremity edema - resolved with lifestyle modification with frequent elevation of lower extremities. 3. HTN - recheck BMET, TSH, Mg today as he also has h/o hypokalemia. Amlodipine is not a good choice given h/o edema. Will consider spironolactone or hydralazine contingent on results. Arrange f/u in HTN clinic.  I do believe his daytime sleepiness and elevated BP is related to untreated OSA and encouraged them to contact neurology to revisit plans for management. 4. Hyperlipidemia - continue statin. AST/ALT OK 03/2018. Lipid profile has traditionally been followed by PCP.  Disposition: F/u with HTN clinic in 2 weeks, and Dr. Angelena Form in 3-4 months.   Medication Adjustments/Labs and Tests Ordered: Current medicines are reviewed at length with the patient today.  Concerns regarding medicines are outlined above. Medication changes, Labs and Tests ordered today are summarized above and listed in the Patient Instructions accessible in Encounters.   Signed, Charlie Pitter, PA-C  04/18/2018 8:58 AM    [address], [place], [place]  [address] Phone: [phone]; Fax: [phone]

## 2018-04-18 ENCOUNTER — Encounter: Payer: Self-pay | Admitting: Physician Assistant

## 2018-04-18 ENCOUNTER — Ambulatory Visit: Payer: Medicare HMO | Admitting: Physician Assistant

## 2018-04-18 ENCOUNTER — Telehealth: Payer: Self-pay | Admitting: *Deleted

## 2018-04-18 VITALS — BP 138/98 | HR 73 | Ht 71.0 in | Wt 249.8 lb

## 2018-04-18 DIAGNOSIS — E876 Hypokalemia: Secondary | ICD-10-CM | POA: Diagnosis not present

## 2018-04-18 DIAGNOSIS — R600 Localized edema: Secondary | ICD-10-CM | POA: Diagnosis not present

## 2018-04-18 DIAGNOSIS — E785 Hyperlipidemia, unspecified: Secondary | ICD-10-CM

## 2018-04-18 DIAGNOSIS — I2510 Atherosclerotic heart disease of native coronary artery without angina pectoris: Secondary | ICD-10-CM | POA: Diagnosis not present

## 2018-04-18 DIAGNOSIS — I10 Essential (primary) hypertension: Secondary | ICD-10-CM | POA: Diagnosis not present

## 2018-04-18 LAB — BASIC METABOLIC PANEL
BUN/Creatinine Ratio: 12 (ref 9–20)
BUN: 17 mg/dL (ref 6–24)
CO2: 27 mmol/L (ref 20–29)
Calcium: 10.1 mg/dL (ref 8.7–10.2)
Chloride: 100 mmol/L (ref 96–106)
Creatinine, Ser: 1.41 mg/dL — ABNORMAL HIGH (ref 0.76–1.27)
GFR calc Af Amer: 65 mL/min/{1.73_m2} (ref >59)
GFR calc non Af Amer: 56 mL/min/{1.73_m2} — ABNORMAL LOW (ref >59)
GLUCOSE: 173 mg/dL — AB (ref 65–99)
POTASSIUM: 3.6 mmol/L (ref 3.5–5.2)
SODIUM: 142 mmol/L (ref 134–144)

## 2018-04-18 LAB — MAGNESIUM: MAGNESIUM: 2 mg/dL (ref 1.6–2.3)

## 2018-04-18 LAB — TSH: TSH: 1.03 u[IU]/mL (ref 0.450–4.500)

## 2018-04-18 MED ORDER — HYDRALAZINE HCL 25 MG PO TABS: 25.0000 mg | ORAL_TABLET | Freq: Three times a day (TID) | ORAL | 3 refills | Status: DC

## 2018-04-18 NOTE — Telephone Encounter (Signed)
-----   Message from Charlie Pitter, [place] sent at 04/18/2018  4:54 PM EDT ----- Labs relatively stable with CKD. K is on lower end of normal.  We discussed initiation of new HTN med at [facility]. Given prior hyperkalemia I will not start spironolactone (we did not RX yet). Please rx hydralazine 25mg  TID. Please monitor blood pressure occasionally at home. Call if they tend to get readings of greater than 130 on the top number or 80 on the bottom number. F/u as planned. Dayna Dunn PA-C

## 2018-04-18 NOTE — Patient Instructions (Signed)
Medication Instructions:  Your physician recommends that you continue on your current medications as directed. Please refer to the Current Medication list given to you today.   Labwork: Your physician recommends that you have lab work today: bmet/mag/tsh   Testing/Procedures: -None   Follow-Up: Your physician recommends that you keep your scheduled follow-up appointment with the HTN clinic. Your physician recommends that you keep your scheduled  follow-up appointment with [facility].   Any Other Special Instructions Will Be Listed Below (If Applicable).  -Highly recommend following up with neurology for sleep apnea   If you need a refill on your cardiac medications before your next appointment, please call your pharmacy.

## 2018-04-18 NOTE — Telephone Encounter (Signed)
Spoke with [name], wife, DPR on file.  Advised her of lab results and recommendations per [name], PA-C.  Wife verbalized understanding and was in agreement with plan.

## 2018-05-08 ENCOUNTER — Ambulatory Visit (INDEPENDENT_AMBULATORY_CARE_PROVIDER_SITE_OTHER): Payer: Medicare HMO | Admitting: Pharmacist

## 2018-05-08 VITALS — BP 132/92 | HR 68

## 2018-05-08 DIAGNOSIS — I10 Essential (primary) hypertension: Secondary | ICD-10-CM

## 2018-05-08 MED ORDER — HYDRALAZINE HCL 50 MG PO TABS: 50.0000 mg | ORAL_TABLET | Freq: Three times a day (TID) | ORAL | 3 refills | Status: DC

## 2018-05-08 NOTE — Patient Instructions (Addendum)
It was nice to meet you today  Increase your hydralazine to 50mg  3x a day  Continue taking your other medications  Record your blood pressure readings and bring them in to your follow up visit in 3-4 weeks  Your blood pressure goal is < 130/61mmHg

## 2018-05-08 NOTE — Progress Notes (Signed)
Patient ID: Tony Davis.                 DOB: August 23, 1961                      MRN: [id number]     HPI: Tony Davis. is a 57 y.o. male patient of Dr Angelena Form referred by [name], PA to HTN clinic. PMH is significant for CAD s/p DES in 2011, CVAs/TIAs, HTN, HLD, OSA on CPAP, prior LV dysfunction, LEE, medication noncompliance, orthostatic hypotension, chronic DVT, PFO with positive bubble study, and basal ganglia hemorrhage in 2015, now with difficulty communicating. He was seen 2 weeks ago for the first time in 2 years and reported chronic fatigue, lethargy, and elevated BP. He was no longer using his CPAP. Lower extremity edema was resolved with frequent elevation of legs. His BP was elevated at 138/98 and he was started on hydralazine 25mg  TID.  Pt presents today with his sister who cares for him. He has been tolerating hydralazine well and denies dizziness, blurred vision, falls, or headaches. He ambulates with a walker. Home BP readings have been in the 140s/90s. He takes his hydralazine at 6am, 12pm, and 6pm. He is able go to the gym once a week and uses the bicycle. His sister has been cooking healthier meals to help pt cut back on sweets and eat more fruits and vegetables. He now weighs ~250 lbs, down from ~290 lbs when he had his stroke.  Current HTN meds: hydralazine 25mg  TID, lisinopril 40mg  daily, Toprol 75mg  BID Previously tried: clonidine - syncopal episode, orthostatic hypotension, amlodipine - edema,  BP goal: <130/20mmHg  Family History: Father with MI and stroke, sister with MI, stroke, and seizure, brother with HTN,  Social History: Former smoker 1 PPD for 35 years, denies alcohol and illicit drug use.  Diet: Does not add salt to food. Does not drink caffeine.  Exercise: Goes to the gym once a week - rides the bicycle and does some circuits.  Home BP readings: 140s/90s  Wt Readings from Last 3 Encounters:  04/18/18 249 lb 12.8 oz (113.3 kg)  03/24/18 250 lb  (113.4 kg)  07/18/17 259 lb 8 oz (117.7 kg)   BP Readings from Last 3 Encounters:  04/18/18 (!) 138/98  03/24/18 (!) 164/106  07/18/17 (!) 144/84   Pulse Readings from Last 3 Encounters:  04/18/18 73  03/24/18 73  07/18/17 81    Renal function: CrCl cannot be calculated (Unknown ideal weight.).  Past Medical History:  Diagnosis Date  . Acute respiratory failure ([place]) 2015   a. Following BG hemorrhage - s/p trache with decannulation.  . Arthritis    "knees" (09/25/2013)  . Basal ganglia hemorrhage ([place]) 2015   a. 2015 - with intraventricular extension and hydrocephalus requiring right crani and VP shunt, complicated by encephalopathy, protein calorie malnutrition, acute resp failure req trache and PEG, BLE DVT s/p IVC, enterobacter PNA, subsequent right hemiparesis and difficulty communicating.  Marland Kitchen BPH (benign prostatic hyperplasia)   . CAD (coronary artery disease)    a. s/p promus DES circumflex artery 10/13/10. b. Cath 04/2013: stable disease but possible small vessel disease -Imdur added.  . Carotid artery disease ([place])    a. 1-29% BICA by study 04/2014.  Marland Kitchen CVA (cerebral vascular accident) ([place]) 02/2010   a. CVA 2011, prior reported TIAs. b. CNS hemorrhage 2015.  Marland Kitchen CVA (cerebral vascular accident) ([place]) 06/2014   "hemorrhagic; in [place]; brain activity  diminshed; ability to do anything; can't do ADLs on own"  . DVT (deep venous thrombosis) ([place]) 2013   a. 2013. b. 2015 - reported BLE DVT s/p IVC filter.   . Enterobacter cloacae pneumonia ([place]) 2015  . [place]   . Folate deficiency anemia   . GERD (gastroesophageal reflux disease)   . Hematuria 8/12  . HLD (hyperlipidemia)   . HTN (hypertension)   . Hydrocephalus 2015   a. 2/2 BG hemorrhage.  . Incontinence of feces   . Lower extremity edema   . LV dysfunction    a. 2011 - EF 45-50%, subsequent 55-60% in 04/2014.  Marland Kitchen Noncompliance   . Orthostatic hypotension   . OSA on CPAP    "not using CPAP right now" (11/30/2014)  .  PFO (patent foramen ovale)    a. echo 8/12: EF 55-65%, grade 2 diast dysfnx; +PFO on bubble study. b. TEE 4/13: EF normal, atrial septum with suspicion for interatrial septum fenestrations without flow across and few large bubbles noted in LA.  This was not felt to require closure   . Protein calorie malnutrition ([place])   . Rheumatoid arthritis(714.0)   . S/P percutaneous endoscopic gastrostomy (PEG) tube placement ([place]) 2015  . TIA (transient ischemic attack)    "1-2; both after the one in 02/2010" (09/25/2013)  . Type II diabetes mellitus ([place])   . Urinary incontinence     Current Outpatient Medications on File Prior to Visit  Medication Sig Dispense Refill  . aspirin EC 81 MG tablet Take 81 mg by mouth daily.    Marland Kitchen atorvastatin (LIPITOR) 80 MG tablet TAKE ONE TABLET BY MOUTH DAILY 90 tablet 0  . escitalopram (LEXAPRO) 20 MG tablet Take 20 mg by mouth daily.    . hydrALAZINE (APRESOLINE) 25 MG tablet Take 1 tablet (25 mg total) by mouth 3 (three) times daily. 270 tablet 3  . lisinopril (PRINIVIL,ZESTRIL) 40 MG tablet TAKE ONE TABLET BY MOUTH DAILY    . metoprolol succinate (TOPROL-XL) 50 MG 24 hr tablet TAKE ONE AND ONE-HALF (1  1/2) TABLETS BY MOUTH TWO TIMES A DAY WITH OR IMMEDIATELY FOLLOWING A MEAL 270 tablet 0  . potassium chloride (K-DUR) 10 MEQ tablet TAKE ONE TABLET BY MOUTH DAILY 90 tablet 0   No current facility-administered medications on file prior to visit.     Allergies  Allergen Reactions  . Iodine Anaphylaxis  . Amlodipine Swelling and Other (See Comments)    Foot edema     Assessment/Plan:  1. Hypertension - BP is improved however remains above goal. Will increase hydralazine to 50mg  TID and continue lisinopril 40mg  daily and Toprol 75mg  BID. Encouraged pt to have BP checked at home. F/u in HTN clinic in 4 weeks.   Joyleen Haselton E. Tyesha Joffe, PharmD, BCACP, [facility] [address] [place], [place] [address] Phone: [phone]; Fax: [phone] 05/08/2018 3:05 PM

## 2018-05-13 ENCOUNTER — Other Ambulatory Visit: Payer: Self-pay | Admitting: Internal Medicine

## 2018-05-13 DIAGNOSIS — I5032 Chronic diastolic (congestive) heart failure: Secondary | ICD-10-CM

## 2018-05-13 DIAGNOSIS — E785 Hyperlipidemia, unspecified: Secondary | ICD-10-CM

## 2018-05-13 DIAGNOSIS — E876 Hypokalemia: Secondary | ICD-10-CM

## 2018-05-13 DIAGNOSIS — I10 Essential (primary) hypertension: Secondary | ICD-10-CM

## 2018-05-13 DIAGNOSIS — I2510 Atherosclerotic heart disease of native coronary artery without angina pectoris: Secondary | ICD-10-CM

## 2018-05-27 DIAGNOSIS — I10 Essential (primary) hypertension: Secondary | ICD-10-CM | POA: Diagnosis not present

## 2018-05-27 DIAGNOSIS — I2510 Atherosclerotic heart disease of native coronary artery without angina pectoris: Secondary | ICD-10-CM | POA: Diagnosis not present

## 2018-05-27 DIAGNOSIS — E669 Obesity, unspecified: Secondary | ICD-10-CM | POA: Diagnosis not present

## 2018-05-27 DIAGNOSIS — G4730 Sleep apnea, unspecified: Secondary | ICD-10-CM | POA: Diagnosis not present

## 2018-05-27 DIAGNOSIS — I69354 Hemiplegia and hemiparesis following cerebral infarction affecting left non-dominant side: Secondary | ICD-10-CM | POA: Diagnosis not present

## 2018-05-27 DIAGNOSIS — E785 Hyperlipidemia, unspecified: Secondary | ICD-10-CM | POA: Diagnosis not present

## 2018-05-27 DIAGNOSIS — R69 Illness, unspecified: Secondary | ICD-10-CM | POA: Diagnosis not present

## 2018-05-27 DIAGNOSIS — I69319 Unspecified symptoms and signs involving cognitive functions following cerebral infarction: Secondary | ICD-10-CM | POA: Diagnosis not present

## 2018-05-27 DIAGNOSIS — R269 Unspecified abnormalities of gait and mobility: Secondary | ICD-10-CM | POA: Diagnosis not present

## 2018-05-27 DIAGNOSIS — R32 Unspecified urinary incontinence: Secondary | ICD-10-CM | POA: Diagnosis not present

## 2018-06-05 ENCOUNTER — Ambulatory Visit (INDEPENDENT_AMBULATORY_CARE_PROVIDER_SITE_OTHER): Payer: Medicare HMO | Admitting: Pharmacist

## 2018-06-05 VITALS — BP 140/92 | HR 74

## 2018-06-05 DIAGNOSIS — I10 Essential (primary) hypertension: Secondary | ICD-10-CM | POA: Diagnosis not present

## 2018-06-05 NOTE — Progress Notes (Signed)
Patient ID: Tony Davis.                 DOB: 24-Feb-1961                      MRN: [id number]     HPI: Tony Davis. is a 57 y.o. male patient of Dr Angelena Form referred by [name], PA to HTN clinic. PMH is significant for CAD s/p DES in 2011, CVAs/TIAs, HTN, HLD, OSA on CPAP, prior LV dysfunction, LEE, medication noncompliance, orthostatic hypotension, chronic DVT, PFO with positive bubble study, and basal ganglia hemorrhage in 2015, now with difficulty communicating. He was seen 2 weeks ago for the first time in 2 years and reported chronic fatigue, lethargy, and elevated BP. He was no longer using his CPAP. Lower extremity edema was resolved with frequent elevation of legs. His BP was elevated at 138/98 and hydralazine was increased to 50mg  TID.  Pt presents today with his sister who cares for him. He has been tolerating hydralazine well and denies dizziness, blurred vision, falls, or headaches. He ambulates with a walker. Home BP readings have been in the 140s/90s. The lowest reading he has seen was 138/80. He takes his hydralazine at 6am, 12pm, and 6pm. He is usually in bed by 7:45pm. He is able go to the gym once a week and uses the bicycle. His sister has been cooking healthier meals to help pt cut back on sweets and eat more fruits and vegetables. He now weighs ~250 lbs, down from ~290 lbs when he had his stroke.  Current HTN meds: hydralazine 50mg  TID (6am, 12:30pm, 6pm), lisinopril 40mg  daily, Toprol 75mg  BID Previously tried: clonidine - syncopal episode, orthostatic hypotension, amlodipine - edema BP goal: <130/15mmHg  Family History: Father with MI and stroke, sister with MI, stroke, and seizure, brother with HTN,  Social History: Former smoker 1 PPD for 35 years, denies alcohol and illicit drug use.  Diet: Does not add salt to food. Does not drink caffeine.  Exercise: Goes to the gym once a week - rides the bicycle and does some circuits.  Home BP readings:  140s/90s  Wt Readings from Last 3 Encounters:  04/18/18 249 lb 12.8 oz (113.3 kg)  03/24/18 250 lb (113.4 kg)  07/18/17 259 lb 8 oz (117.7 kg)   BP Readings from Last 3 Encounters:  05/08/18 (!) 132/92  04/18/18 (!) 138/98  03/24/18 (!) 164/106   Pulse Readings from Last 3 Encounters:  05/08/18 68  04/18/18 73  03/24/18 73    Renal function: CrCl cannot be calculated (Patient's most recent lab result is older than the maximum 21 days allowed.).  Past Medical History:  Diagnosis Date  . Acute respiratory failure ([place]) 2015   a. Following BG hemorrhage - s/p trache with decannulation.  . Arthritis    "knees" (09/25/2013)  . Basal ganglia hemorrhage ([place]) 2015   a. 2015 - with intraventricular extension and hydrocephalus requiring right crani and VP shunt, complicated by encephalopathy, protein calorie malnutrition, acute resp failure req trache and PEG, BLE DVT s/p IVC, enterobacter PNA, subsequent right hemiparesis and difficulty communicating.  Marland Kitchen BPH (benign prostatic hyperplasia)   . CAD (coronary artery disease)    a. s/p promus DES circumflex artery 10/13/10. b. Cath 04/2013: stable disease but possible small vessel disease -Imdur added.  . Carotid artery disease ([place])    a. 1-29% BICA by study 04/2014.  Marland Kitchen CVA (cerebral vascular accident) ([place]) 02/2010  a. CVA 2011, prior reported TIAs. b. CNS hemorrhage 2015.  Marland Kitchen CVA (cerebral vascular accident) ([place]) 06/2014   "hemorrhagic; in [place]; brain activity diminshed; ability to do anything; can't do ADLs on own"  . DVT (deep venous thrombosis) ([place]) 2013   a. 2013. b. 2015 - reported BLE DVT s/p IVC filter.   . Enterobacter cloacae pneumonia ([place]) 2015  . Falls   . Folate deficiency anemia   . GERD (gastroesophageal reflux disease)   . Hematuria 8/12  . HLD (hyperlipidemia)   . HTN (hypertension)   . Hydrocephalus 2015   a. 2/2 BG hemorrhage.  . Incontinence of feces   . Lower extremity edema   . LV dysfunction    a.  2011 - EF 45-50%, subsequent 55-60% in 04/2014.  Marland Kitchen Noncompliance   . Orthostatic hypotension   . OSA on CPAP    "not using CPAP right now" (11/30/2014)  . PFO (patent foramen ovale)    a. echo 8/12: EF 55-65%, grade 2 diast dysfnx; +PFO on bubble study. b. TEE 4/13: EF normal, atrial septum with suspicion for interatrial septum fenestrations without flow across and few large bubbles noted in LA.  This was not felt to require closure   . Protein calorie malnutrition ([place])   . Rheumatoid arthritis(714.0)   . S/P percutaneous endoscopic gastrostomy (PEG) tube placement ([place]) 2015  . TIA (transient ischemic attack)    "1-2; both after the one in 02/2010" (09/25/2013)  . Type II diabetes mellitus ([place])   . Urinary incontinence     Current Outpatient Medications on File Prior to Visit  Medication Sig Dispense Refill  . aspirin EC 81 MG tablet Take 81 mg by mouth daily.    Marland Kitchen atorvastatin (LIPITOR) 80 MG tablet TAKE ONE TABLET BY MOUTH DAILY 90 tablet 0  . escitalopram (LEXAPRO) 20 MG tablet Take 20 mg by mouth daily.    . hydrALAZINE (APRESOLINE) 50 MG tablet Take 1 tablet (50 mg total) by mouth 3 (three) times daily. 90 tablet 3  . lisinopril (PRINIVIL,ZESTRIL) 40 MG tablet TAKE ONE TABLET BY MOUTH DAILY    . metoprolol succinate (TOPROL-XL) 50 MG 24 hr tablet TAKE ONE AND ONE-HALF (1  1/2) TABLETS BY MOUTH TWO TIMES A DAY WITH OR IMMEDIATELY FOLLOWING A MEAL 270 tablet 0  . potassium chloride (K-DUR) 10 MEQ tablet TAKE ONE TABLET BY MOUTH DAILY 90 tablet 0   No current facility-administered medications on file prior to visit.     Allergies  Allergen Reactions  . Iodine Anaphylaxis  . Amlodipine Swelling and Other (See Comments)    Foot edema     Assessment/Plan:  1. Hypertension - BP is improved however remains above goal. Will increase hydralazine to 75mg  TID and continue lisinopril 40mg  daily and Toprol 75mg  BID. Advised patient's sister that if his BP remains above goal in 2 weeks,  she can further titrate hydralazine to 100mg  TID. F/u in HTN clinic in 4 weeks.   Reinhardt Licausi E. Rosena Bartle, PharmD, BCACP, [place] [address], [place], [place] [address] Phone: [phone]; Fax: [phone] 06/05/2018 8:46 AM

## 2018-06-05 NOTE — Patient Instructions (Signed)
It was nice to see you today  Increase your hydralazine to 75mg  3x a day. If in 2 weeks, your blood pressure remains above 130/80, you can increase your dose to 100mg  3x a day.  Continue taking your other medications as you have been  Recheck blood pressure in clinic in 4 weeks  Call [facility] with any issues or if you need a refill on your higher hydralazine dose before the next visit. [phone]

## 2018-07-07 ENCOUNTER — Ambulatory Visit (INDEPENDENT_AMBULATORY_CARE_PROVIDER_SITE_OTHER): Payer: Medicare HMO | Admitting: Pharmacist

## 2018-07-07 VITALS — BP 132/96 | HR 74

## 2018-07-07 DIAGNOSIS — I2510 Atherosclerotic heart disease of native coronary artery without angina pectoris: Secondary | ICD-10-CM | POA: Diagnosis not present

## 2018-07-07 DIAGNOSIS — I10 Essential (primary) hypertension: Secondary | ICD-10-CM | POA: Diagnosis not present

## 2018-07-07 DIAGNOSIS — I5032 Chronic diastolic (congestive) heart failure: Secondary | ICD-10-CM

## 2018-07-07 MED ORDER — HYDRALAZINE HCL 50 MG PO TABS: 75.0000 mg | ORAL_TABLET | Freq: Three times a day (TID) | ORAL | 3 refills | Status: DC

## 2018-07-07 MED ORDER — METOPROLOL SUCCINATE ER 100 MG PO TB24: 100.0000 mg | ORAL_TABLET | Freq: Two times a day (BID) | ORAL | 3 refills | Status: DC

## 2018-07-07 NOTE — Progress Notes (Signed)
Patient ID: Tony Davis.                 DOB: 09/18/1961                      MRN: [id number]     HPI: Tony Davis. is a 57 y.o. male patient of Dr Angelena Form referred by [name], PA to HTN clinic. PMH is significant for CAD s/p DES in 2011, CVAs/TIAs, HTN, HLD, OSA on CPAP, prior LV dysfunction, LEE, medication noncompliance, orthostatic hypotension, chronic DVT, PFO with positive bubble study, and basal ganglia hemorrhage in 2015, now with difficulty communicating. He was seen in May 2019 for the first time in 2 years and reported chronic fatigue, lethargy, and elevated BP. He was no longer using his CPAP. Lower extremity edema was resolved with frequent elevation of legs. He has been following in HTN clinic since then. Most recently, hydralazine dose was increased to 75mg  TID with instructions to increase to 100mg  TID if BP remained elevated after 2 weeks.  Pt presents today with his sister who cares for him. He has been tolerating hydralazine well and denies dizziness, blurred vision, falls, or headaches. He did have 2 episodes of diarrhea when his hydralazine dose increased, so his sister was hesitant to increase his hydralazine to the 100mg  dose and he has been taking 75mg  TID for the past month. He ambulates with a walker. Home BP readings have improved to 135/90s, low reading of 117/83.  He takes his hydralazine at 6am, 12pm, and 6pm. He is usually in bed by 7:45pm. He is able go to the gym once a week and uses the bicycle. His sister has been cooking healthier meals to help pt cut back on sweets and eat more fruits and vegetables. He now weighs ~250 lbs, down from ~290 lbs when he had his stroke.  Current HTN meds: hydralazine 75mg  TID (6am, 12:30pm, 6pm), lisinopril 40mg  daily, Toprol 75mg  BID Previously tried: clonidine - syncopal episode, orthostatic hypotension, amlodipine - edema BP goal: <130/63mmHg  Family History: Father with MI and stroke, sister with MI, stroke, and  seizure, brother with HTN,  Social History: Former smoker 1 PPD for 35 years, denies alcohol and illicit drug use.  Diet: Does not add salt to food. Does not drink caffeine.  Exercise: Goes to the gym once a week - rides the bicycle and does some circuits.  Home BP readings: 135/90s, low of 117/83  Wt Readings from Last 3 Encounters:  04/18/18 249 lb 12.8 oz (113.3 kg)  03/24/18 250 lb (113.4 kg)  07/18/17 259 lb 8 oz (117.7 kg)   BP Readings from Last 3 Encounters:  06/05/18 (!) 140/92  05/08/18 (!) 132/92  04/18/18 (!) 138/98   Pulse Readings from Last 3 Encounters:  06/05/18 74  05/08/18 68  04/18/18 73    Renal function: CrCl cannot be calculated (Patient's most recent lab result is older than the maximum 21 days allowed.).  Past Medical History:  Diagnosis Date  . Acute respiratory failure ([place]) 2015   a. Following BG hemorrhage - s/p trache with decannulation.  . Arthritis    "knees" (09/25/2013)  . Basal ganglia hemorrhage ([place]) 2015   a. 2015 - with intraventricular extension and hydrocephalus requiring right crani and VP shunt, complicated by encephalopathy, protein calorie malnutrition, acute resp failure req trache and PEG, BLE DVT s/p IVC, enterobacter PNA, subsequent right hemiparesis and difficulty communicating.  Marland Kitchen BPH (benign prostatic  hyperplasia)   . CAD (coronary artery disease)    a. s/p promus DES circumflex artery 10/13/10. b. Cath 04/2013: stable disease but possible small vessel disease -Imdur added.  . Carotid artery disease ([place])    a. 1-29% BICA by study 04/2014.  Marland Kitchen CVA (cerebral vascular accident) ([place]) 02/2010   a. CVA 2011, prior reported TIAs. b. CNS hemorrhage 2015.  Marland Kitchen CVA (cerebral vascular accident) ([place]) 06/2014   "hemorrhagic; in [place]; brain activity diminshed; ability to do anything; can't do ADLs on own"  . DVT (deep venous thrombosis) ([place]) 2013   a. 2013. b. 2015 - reported BLE DVT s/p IVC filter.   . Enterobacter cloacae  pneumonia ([place]) 2015  . Falls   . Folate deficiency anemia   . GERD (gastroesophageal reflux disease)   . Hematuria 8/12  . HLD (hyperlipidemia)   . HTN (hypertension)   . Hydrocephalus 2015   a. 2/2 BG hemorrhage.  . Incontinence of feces   . Lower extremity edema   . LV dysfunction    a. 2011 - EF 45-50%, subsequent 55-60% in 04/2014.  Marland Kitchen Noncompliance   . Orthostatic hypotension   . OSA on CPAP    "not using CPAP right now" (11/30/2014)  . PFO (patent foramen ovale)    a. echo 8/12: EF 55-65%, grade 2 diast dysfnx; +PFO on bubble study. b. TEE 4/13: EF normal, atrial septum with suspicion for interatrial septum fenestrations without flow across and few large bubbles noted in LA.  This was not felt to require closure   . Protein calorie malnutrition ([place])   . Rheumatoid arthritis(714.0)   . S/P percutaneous endoscopic gastrostomy (PEG) tube placement ([place]) 2015  . TIA (transient ischemic attack)    "1-2; both after the one in 02/2010" (09/25/2013)  . Type II diabetes mellitus ([place])   . Urinary incontinence     Current Outpatient Medications on File Prior to Visit  Medication Sig Dispense Refill  . aspirin EC 81 MG tablet Take 81 mg by mouth daily.    Marland Kitchen atorvastatin (LIPITOR) 80 MG tablet TAKE ONE TABLET BY MOUTH DAILY 90 tablet 0  . escitalopram (LEXAPRO) 20 MG tablet Take 20 mg by mouth daily.    . hydrALAZINE (APRESOLINE) 50 MG tablet Take 1.5 tablets (75 mg total) by mouth 3 (three) times daily. 90 tablet 3  . lisinopril (PRINIVIL,ZESTRIL) 40 MG tablet TAKE ONE TABLET BY MOUTH DAILY    . metoprolol succinate (TOPROL-XL) 50 MG 24 hr tablet TAKE ONE AND ONE-HALF (1  1/2) TABLETS BY MOUTH TWO TIMES A DAY WITH OR IMMEDIATELY FOLLOWING A MEAL 270 tablet 0  . potassium chloride (K-DUR) 10 MEQ tablet TAKE ONE TABLET BY MOUTH DAILY 90 tablet 0   No current facility-administered medications on file prior to visit.     Allergies  Allergen Reactions  . Iodine Anaphylaxis  .  Amlodipine Swelling and Other (See Comments)    Foot edema     Assessment/Plan:  1. Hypertension - BP is improved and very close to goal <518/84ZYSA, however diastolic reading remains a bit elevated. Will increase Toprol from 75mg  BID to 100mg  BID. Advised patient's sister that if his BP remains elevated above goal in 2 weeks to increase his hydralazine to 100mg  TID. Continue lisinopril 40mg  daily. F/u with Dr Angelena Form in 4 weeks as scheduled, can f/u in HTN clinic after that as needed.   Tavoris Brisk E. Claudell Wohler, PharmD, BCACP, [place] [address],  [place] [address] Phone: [phone]; Fax: [phone] 07/07/2018 7:28 AM

## 2018-07-07 NOTE — Patient Instructions (Signed)
Increase your metoprolol to 100mg  twice a day  Continue with the hydralazine 75mg  3x a day. If blood pressure stays above 130/80 after 2 weeks, increase the hydralazine to 100mg  3x a day  Keep follow up with Dr Angelena Form in 3-4 weeks as scheduled  Call Megan, pharmacist in the blood pressure clinic with any blood pressure related concerns. I am more than happy to schedule follow up for blood pressure management after appointment with Dr Angelena Form [phone]

## 2018-07-31 ENCOUNTER — Ambulatory Visit: Payer: Medicare HMO | Admitting: Cardiovascular Disease

## 2018-08-16 ENCOUNTER — Other Ambulatory Visit: Payer: Self-pay | Admitting: Internal Medicine

## 2018-08-16 DIAGNOSIS — E785 Hyperlipidemia, unspecified: Secondary | ICD-10-CM

## 2018-08-16 DIAGNOSIS — E876 Hypokalemia: Secondary | ICD-10-CM

## 2018-09-17 DIAGNOSIS — R69 Illness, unspecified: Secondary | ICD-10-CM | POA: Diagnosis not present

## 2018-09-20 ENCOUNTER — Encounter (HOSPITAL_COMMUNITY): Payer: Self-pay | Admitting: Emergency Medicine

## 2018-09-20 ENCOUNTER — Observation Stay (HOSPITAL_COMMUNITY)
Admission: EM | Admit: 2018-09-20 | Discharge: 2018-09-21 | Disposition: A | Discharge status: Home / Self Care | Payer: Medicare HMO | Attending: Internal Medicine | Admitting: Internal Medicine

## 2018-09-20 ENCOUNTER — Emergency Department (HOSPITAL_COMMUNITY): Payer: Medicare HMO

## 2018-09-20 DIAGNOSIS — Z888 Allergy status to other drugs, medicaments and biological substances status: Secondary | ICD-10-CM | POA: Diagnosis not present

## 2018-09-20 DIAGNOSIS — R69 Illness, unspecified: Secondary | ICD-10-CM | POA: Diagnosis not present

## 2018-09-20 DIAGNOSIS — F329 Major depressive disorder, single episode, unspecified: Secondary | ICD-10-CM | POA: Diagnosis not present

## 2018-09-20 DIAGNOSIS — I2510 Atherosclerotic heart disease of native coronary artery without angina pectoris: Secondary | ICD-10-CM | POA: Insufficient documentation

## 2018-09-20 DIAGNOSIS — R51 Headache: Secondary | ICD-10-CM | POA: Diagnosis not present

## 2018-09-20 DIAGNOSIS — E876 Hypokalemia: Secondary | ICD-10-CM | POA: Diagnosis not present

## 2018-09-20 DIAGNOSIS — R410 Disorientation, unspecified: Secondary | ICD-10-CM | POA: Diagnosis not present

## 2018-09-20 DIAGNOSIS — I610 Nontraumatic intracerebral hemorrhage in hemisphere, subcortical: Secondary | ICD-10-CM | POA: Diagnosis present

## 2018-09-20 DIAGNOSIS — G4733 Obstructive sleep apnea (adult) (pediatric): Secondary | ICD-10-CM | POA: Diagnosis not present

## 2018-09-20 DIAGNOSIS — Z982 Presence of cerebrospinal fluid drainage device: Secondary | ICD-10-CM | POA: Insufficient documentation

## 2018-09-20 DIAGNOSIS — F22 Delusional disorders: Secondary | ICD-10-CM | POA: Insufficient documentation

## 2018-09-20 DIAGNOSIS — E1165 Type 2 diabetes mellitus with hyperglycemia: Secondary | ICD-10-CM | POA: Diagnosis not present

## 2018-09-20 DIAGNOSIS — R4182 Altered mental status, unspecified: Secondary | ICD-10-CM | POA: Diagnosis present

## 2018-09-20 DIAGNOSIS — F419 Anxiety disorder, unspecified: Secondary | ICD-10-CM | POA: Diagnosis not present

## 2018-09-20 DIAGNOSIS — I69351 Hemiplegia and hemiparesis following cerebral infarction affecting right dominant side: Secondary | ICD-10-CM | POA: Diagnosis not present

## 2018-09-20 DIAGNOSIS — N400 Enlarged prostate without lower urinary tract symptoms: Secondary | ICD-10-CM | POA: Diagnosis not present

## 2018-09-20 DIAGNOSIS — I10 Essential (primary) hypertension: Secondary | ICD-10-CM | POA: Diagnosis present

## 2018-09-20 DIAGNOSIS — I629 Nontraumatic intracranial hemorrhage, unspecified: Secondary | ICD-10-CM | POA: Diagnosis not present

## 2018-09-20 DIAGNOSIS — K219 Gastro-esophageal reflux disease without esophagitis: Secondary | ICD-10-CM | POA: Diagnosis not present

## 2018-09-20 DIAGNOSIS — M069 Rheumatoid arthritis, unspecified: Secondary | ICD-10-CM | POA: Diagnosis not present

## 2018-09-20 DIAGNOSIS — Z87891 Personal history of nicotine dependence: Secondary | ICD-10-CM | POA: Insufficient documentation

## 2018-09-20 DIAGNOSIS — Z955 Presence of coronary angioplasty implant and graft: Secondary | ICD-10-CM | POA: Insufficient documentation

## 2018-09-20 DIAGNOSIS — Z79899 Other long term (current) drug therapy: Secondary | ICD-10-CM | POA: Insufficient documentation

## 2018-09-20 DIAGNOSIS — E785 Hyperlipidemia, unspecified: Secondary | ICD-10-CM | POA: Insufficient documentation

## 2018-09-20 DIAGNOSIS — Z7982 Long term (current) use of aspirin: Secondary | ICD-10-CM | POA: Diagnosis not present

## 2018-09-20 DIAGNOSIS — Z86718 Personal history of other venous thrombosis and embolism: Secondary | ICD-10-CM | POA: Insufficient documentation

## 2018-09-20 DIAGNOSIS — I5032 Chronic diastolic (congestive) heart failure: Secondary | ICD-10-CM | POA: Diagnosis present

## 2018-09-20 LAB — DIFFERENTIAL
ABS IMMATURE GRANULOCYTES: 0.01 10*3/uL (ref 0.00–0.07)
BASOS ABS: 0 10*3/uL (ref 0.0–0.1)
Basophils Relative: 1 %
Eosinophils Absolute: 0.2 10*3/uL (ref 0.0–0.5)
Eosinophils Relative: 3 %
IMMATURE GRANULOCYTES: 0 %
LYMPHS ABS: 1.6 10*3/uL (ref 0.7–4.0)
LYMPHS PCT: 20 %
Monocytes Absolute: 0.4 10*3/uL (ref 0.1–1.0)
Monocytes Relative: 6 %
Neutro Abs: 5.4 10*3/uL (ref 1.7–7.7)
Neutrophils Relative %: 70 %

## 2018-09-20 LAB — COMPREHENSIVE METABOLIC PANEL
ALBUMIN: 4 g/dL (ref 3.5–5.0)
ALK PHOS: 101 U/L (ref 38–126)
ALT: 19 U/L (ref 0–44)
AST: 20 U/L (ref 15–41)
Anion gap: 10 (ref 5–15)
BILIRUBIN TOTAL: 0.8 mg/dL (ref 0.3–1.2)
BUN: 14 mg/dL (ref 6–20)
CALCIUM: 9.2 mg/dL (ref 8.9–10.3)
CO2: 27 mmol/L (ref 22–32)
Chloride: 102 mmol/L (ref 98–111)
Creatinine, Ser: 1.25 mg/dL — ABNORMAL HIGH (ref 0.61–1.24)
GFR calc Af Amer: >60 mL/min (ref >60)
GFR calc non Af Amer: >60 mL/min (ref >60)
Glucose, Bld: 132 mg/dL — ABNORMAL HIGH (ref 70–99)
POTASSIUM: 3.5 mmol/L (ref 3.5–5.1)
SODIUM: 139 mmol/L (ref 135–145)
Total Protein: 7 g/dL (ref 6.5–8.1)

## 2018-09-20 LAB — CBC
HEMATOCRIT: 46.3 % (ref 39.0–52.0)
HEMOGLOBIN: 14.6 g/dL (ref 13.0–17.0)
MCH: 30.2 pg (ref 26.0–34.0)
MCHC: 31.5 g/dL (ref 30.0–36.0)
MCV: 95.7 fL (ref 80.0–100.0)
NRBC: 0 % (ref 0.0–0.2)
Platelets: 272 10*3/uL (ref 150–400)
RBC: 4.84 MIL/uL (ref 4.22–5.81)
RDW: 12.5 % (ref 11.5–15.5)
WBC: 7.7 10*3/uL (ref 4.0–10.5)

## 2018-09-20 LAB — I-STAT CHEM 8, ED
BUN: 16 mg/dL (ref 6–20)
CHLORIDE: 101 mmol/L (ref 98–111)
Calcium, Ion: 1.08 mmol/L — ABNORMAL LOW (ref 1.15–1.40)
Creatinine, Ser: 1.2 mg/dL (ref 0.61–1.24)
Glucose, Bld: 131 mg/dL — ABNORMAL HIGH (ref 70–99)
HCT: 45 % (ref 39.0–52.0)
Hemoglobin: 15.3 g/dL (ref 13.0–17.0)
POTASSIUM: 3.5 mmol/L (ref 3.5–5.1)
SODIUM: 141 mmol/L (ref 135–145)
TCO2: 30 mmol/L (ref 22–32)

## 2018-09-20 LAB — I-STAT TROPONIN, ED: Troponin i, poc: 0 ng/mL (ref 0.00–0.08)

## 2018-09-20 LAB — PROTIME-INR
INR: 1.02
PROTHROMBIN TIME: 13.3 s (ref 11.4–15.2)

## 2018-09-20 LAB — APTT: aPTT: 33 seconds (ref 24–36)

## 2018-09-20 LAB — CBG MONITORING, ED: GLUCOSE-CAPILLARY: 125 mg/dL — AB (ref 70–99)

## 2018-09-20 MED ORDER — ASPIRIN EC 81 MG PO TBEC
81.0000 mg | DELAYED_RELEASE_TABLET | Freq: Every day | ORAL | Status: DC
  Administered 2018-09-21: 81 mg via ORAL
  Filled 2018-09-20: qty 1

## 2018-09-20 MED ORDER — ACETAMINOPHEN 325 MG PO TABS: 650.0000 mg | ORAL_TABLET | Freq: Four times a day (QID) | ORAL | Status: DC | PRN

## 2018-09-20 MED ORDER — ACETAMINOPHEN 650 MG RE SUPP: 650.0000 mg | Freq: Four times a day (QID) | RECTAL | Status: DC | PRN

## 2018-09-20 MED ORDER — POTASSIUM CHLORIDE CRYS ER 10 MEQ PO TBCR
10.0000 meq | EXTENDED_RELEASE_TABLET | Freq: Every day | ORAL | Status: DC
  Administered 2018-09-21: 10 meq via ORAL
  Filled 2018-09-20 (×2): qty 1

## 2018-09-20 MED ORDER — SODIUM CHLORIDE 0.9 % IV SOLN: 250.0000 mL | INTRAVENOUS | Status: DC | PRN

## 2018-09-20 MED ORDER — ATORVASTATIN CALCIUM 80 MG PO TABS
80.0000 mg | ORAL_TABLET | Freq: Every day | ORAL | Status: DC
  Administered 2018-09-21: 80 mg via ORAL
  Filled 2018-09-20: qty 1

## 2018-09-20 MED ORDER — SODIUM CHLORIDE 0.9% FLUSH: 3.0000 mL | INTRAVENOUS | Status: DC | PRN

## 2018-09-20 MED ORDER — LISINOPRIL 40 MG PO TABS
40.0000 mg | ORAL_TABLET | Freq: Every day | ORAL | Status: DC
  Administered 2018-09-21: 40 mg via ORAL
  Filled 2018-09-20: qty 1

## 2018-09-20 MED ORDER — HYDRALAZINE HCL 50 MG PO TABS
75.0000 mg | ORAL_TABLET | Freq: Three times a day (TID) | ORAL | Status: DC
  Administered 2018-09-20 – 2018-09-21 (×2): 75 mg via ORAL
  Filled 2018-09-20 (×2): qty 1

## 2018-09-20 MED ORDER — HYDRALAZINE HCL 20 MG/ML IJ SOLN: 5.0000 mg | Freq: Four times a day (QID) | INTRAMUSCULAR | Status: DC | PRN

## 2018-09-20 MED ORDER — ENOXAPARIN SODIUM 40 MG/0.4ML ~~LOC~~ SOLN
40.0000 mg | SUBCUTANEOUS | Status: DC
  Filled 2018-09-20: qty 0.4

## 2018-09-20 MED ORDER — SODIUM CHLORIDE 0.9% FLUSH
3.0000 mL | Freq: Two times a day (BID) | INTRAVENOUS | Status: DC
  Administered 2018-09-20: 3 mL via INTRAVENOUS

## 2018-09-20 MED ORDER — METOPROLOL SUCCINATE ER 100 MG PO TB24
100.0000 mg | ORAL_TABLET | Freq: Two times a day (BID) | ORAL | Status: DC
  Administered 2018-09-20 – 2018-09-21 (×2): 100 mg via ORAL
  Filled 2018-09-20 (×2): qty 1

## 2018-09-20 MED ORDER — ESCITALOPRAM OXALATE 20 MG PO TABS
20.0000 mg | ORAL_TABLET | Freq: Every day | ORAL | Status: DC
  Administered 2018-09-21: 20 mg via ORAL
  Filled 2018-09-20: qty 1

## 2018-09-20 NOTE — ED Provider Notes (Signed)
[facility] Provider Note   CSN: [id number] Arrival date & time: 09/20/18  1725     History   Chief Complaint Chief Complaint  Patient presents with  . Altered Mental Status    HPI Tony Davis. is a 57 y.o. male.  57 year old male presents with altered mental status x2 days after starting a new medication for his baseline memory deficits.  Patient also has a history of having a VP shunt secondary to a history of a intracranial hemorrhage.  Wife states that he has not had any emesis.  Has not noted a headache.  States he has had some nausea as well as visual hallucinations.  Denies any fever or chills.  No cough or congestion.  No urinary symptoms.  Patient does not complain of having any abdominal or chest discomfort.  No focal deficits have been noted.  No history of illicit drug use.  Patient's symptoms have been progressively worse and nothing makes them better and no treatment used prior to arrival     Past Medical History:  Diagnosis Date  . Acute respiratory failure ([place]) 2015   a. Following BG hemorrhage - s/p trache with decannulation.  . Arthritis    "knees" (09/25/2013)  . Basal ganglia hemorrhage ([place]) 2015   a. 2015 - with intraventricular extension and hydrocephalus requiring right crani and VP shunt, complicated by encephalopathy, protein calorie malnutrition, acute resp failure req trache and PEG, BLE DVT s/p IVC, enterobacter PNA, subsequent right hemiparesis and difficulty communicating.  Marland Kitchen BPH (benign prostatic hyperplasia)   . CAD (coronary artery disease)    a. s/p promus DES circumflex artery 10/13/10. b. Cath 04/2013: stable disease but possible small vessel disease -Imdur added.  . Carotid artery disease ([place])    a. 1-29% BICA by study 04/2014.  Marland Kitchen CVA (cerebral vascular accident) ([place]) 02/2010   a. CVA 2011, prior reported TIAs. b. CNS hemorrhage 2015.  Marland Kitchen CVA (cerebral vascular accident) ([place]) 06/2014   "hemorrhagic; in [place]; brain activity diminshed; ability to do anything; can't do ADLs on own"  . DVT (deep venous thrombosis) ([place]) 2013   a. 2013. b. 2015 - reported BLE DVT s/p IVC filter.   . Enterobacter cloacae pneumonia ([place]) 2015  . Falls   . Folate deficiency anemia   . GERD (gastroesophageal reflux disease)   . Hematuria 8/12  . HLD (hyperlipidemia)   . HTN (hypertension)   . Hydrocephalus ([place]) 2015   a. 2/2 BG hemorrhage.  . Incontinence of feces   . Lower extremity edema   . LV dysfunction    a. 2011 - EF 45-50%, subsequent 55-60% in 04/2014.  Marland Kitchen Noncompliance   . Orthostatic hypotension   . OSA on CPAP    "not using CPAP right now" (11/30/2014)  . PFO (patent foramen ovale)    a. echo 8/12: EF 55-65%, grade 2 diast dysfnx; +PFO on bubble study. b. TEE 4/13: EF normal, atrial septum with suspicion for interatrial septum fenestrations without flow across and few large bubbles noted in LA.  This was not felt to require closure   . Protein calorie malnutrition ([place])   . Rheumatoid arthritis(714.0)   . S/P percutaneous endoscopic gastrostomy (PEG) tube placement ([place]) 2015  . TIA (transient ischemic attack)    "1-2; both after the one in 02/2010" (09/25/2013)  . Type II diabetes mellitus ([place])   . Urinary incontinence     Patient Active Problem List   Diagnosis Date Noted  .  Hypokalemia 12/26/2015  . Depression 02/03/2015  . Diastolic dysfunction with chronic heart failure ([place]) 11/30/2014  . Essential hypertension 11/03/2014  . LV dysfunction   . Hydrocephalus ([place])   . Basal ganglia hemorrhage ([place]) 07/30/2014  . Routine general medical examination at a health care facility 02/11/2014  . Insomnia, persistent 03/25/2013  . Chronic rhinitis 03/25/2013  . Hemiparesis due to old cerebral infarction ([place]) 03/20/2013  . Folate deficiency anemia 10/13/2012  . Long term (current) use of anticoagulants 03/28/2012  . Obstructive sleep apnea 08/16/2011  . Prediabetes  08/06/2011  . PATENT FORAMEN OVALE 03/13/2010  . CAD, NATIVE VESSEL 01/26/2010  . Hyperlipidemia with target LDL less than 100 01/23/2010  . GERD 01/23/2010    Past Surgical History:  Procedure Laterality Date  . CORONARY ANGIOPLASTY WITH STENT PLACEMENT  2012   "1" (09/24/2013)  . KNEE ARTHROSCOPY Bilateral 2000's   2 on left and 3 on rt  . LEFT HEART CATHETERIZATION WITH CORONARY ANGIOGRAM N/A 04/24/2013   Procedure: LEFT HEART CATHETERIZATION WITH CORONARY ANGIOGRAM;  Surgeon: Burnell Blanks, MD;  Location: [facility] CATH LAB;  Service: Cardiovascular;  Laterality: N/A;  . TEE WITHOUT CARDIOVERSION  03/21/2012   Procedure: TRANSESOPHAGEAL ECHOCARDIOGRAM (TEE);  Surgeon: Fay Records, MD;  Location: [facility] ENDOSCOPY;  Service: Cardiovascular;  Laterality: N/A;        Home Medications    Prior to Admission medications   Medication Sig Start Date End Date Taking? Authorizing Provider  aspirin EC 81 MG tablet Take 81 mg by mouth daily.    [provider]  atorvastatin (LIPITOR) 80 MG tablet TAKE ONE TABLET BY MOUTH DAILY 08/16/18   Janith Lima, MD  escitalopram (LEXAPRO) 20 MG tablet Take 20 mg by mouth daily.    [provider]  hydrALAZINE (APRESOLINE) 50 MG tablet Take 1.5 tablets (75 mg total) by mouth 3 (three) times daily. 07/07/18   Burnell Blanks, MD  lisinopril (PRINIVIL,ZESTRIL) 40 MG tablet TAKE ONE TABLET BY MOUTH DAILY 04/15/18   [provider]  metoprolol succinate (TOPROL-XL) 100 MG 24 hr tablet Take 1 tablet (100 mg total) by mouth 2 (two) times daily. 07/07/18   Burnell Blanks, MD  potassium chloride (K-DUR) 10 MEQ tablet TAKE ONE TABLET BY MOUTH DAILY 08/16/18   Janith Lima, MD    Family History Family History  Problem Relation Age of Onset  . Heart attack Father   . Stroke Father   . Heart attack Sister   . Hypertension Brother   . Seizures Sister   . Stroke Sister   . Cancer Neg Hx   . Kidney disease Neg Hx   .  Diabetes Neg Hx     Social History Social History   Tobacco Use  . Smoking status: Former Smoker    Packs/day: 1.00    Years: 35.00    Pack years: 35.00    Types: Cigarettes    Last attempt to quit: 03/10/2012    Years since quitting: 6.5  . Smokeless tobacco: Never Used  Substance Use Topics  . Alcohol use: No    Alcohol/week: 0.0 standard drinks  . Drug use: No     Allergies   Iodine and Amlodipine   Review of Systems Review of Systems  All other systems reviewed and are negative.    Physical Exam Updated Vital Signs BP (!) 147/88   Pulse 68   Temp 98 F (36.7 C) (Oral)   Resp 16   SpO2 98%  Physical Exam  Constitutional: He is oriented to person, place, and time. He appears well-developed and well-nourished. He appears lethargic.  Non-toxic appearance. No distress.  HENT:  Head: Normocephalic and atraumatic.  Eyes: Pupils are equal, round, and reactive to light. Conjunctivae, EOM and lids are normal.  Neck: Normal range of motion. Neck supple. No tracheal deviation present. No thyroid mass present.  Cardiovascular: Normal rate, regular rhythm and normal heart sounds. Exam reveals no gallop.  No murmur heard. Pulmonary/Chest: Effort normal and breath sounds normal. No stridor. No respiratory distress. He has no decreased breath sounds. He has no wheezes. He has no rhonchi. He has no rales.  Abdominal: Soft. Normal appearance and bowel sounds are normal. He exhibits no distension. There is no tenderness. There is no rebound and no CVA tenderness.  Musculoskeletal: Normal range of motion. He exhibits no edema or tenderness.  Neurological: He is oriented to person, place, and time. He has normal strength. He appears lethargic. He displays no tremor. No cranial nerve deficit or sensory deficit. GCS eye subscore is 4. GCS verbal subscore is 5. GCS motor subscore is 6.  Skin: Skin is warm and dry. No abrasion and no rash noted.  Psychiatric: His affect is blunt. His  speech is delayed. He is withdrawn. He is not actively hallucinating. He is inattentive.  Nursing note and vitals reviewed.    ED Treatments / Results  Labs (all labs ordered are listed, but only abnormal results are displayed) Labs Reviewed  CBG MONITORING, ED - Abnormal; Notable for the following components:      Result Value   Glucose-Capillary 125 (*)    All other components within normal limits  I-STAT CHEM 8, ED - Abnormal; Notable for the following components:   Glucose, Bld 131 (*)    Calcium, Ion 1.08 (*)    All other components within normal limits  CBC  DIFFERENTIAL  APTT  COMPREHENSIVE METABOLIC PANEL  PROTIME-INR  I-STAT TROPONIN, ED    EKG EKG Interpretation  Date/Time:  Saturday September 20 2018 17:31:17 EDT Ventricular Rate:  64 PR Interval:  142 QRS Duration: 96 QT Interval:  432 QTC Calculation: 445 R Axis:   -26 Text Interpretation:  Normal sinus rhythm Normal ECG No significant change since last tracing Confirmed by Lacretia Leigh ([id number]) on 09/20/2018 5:44:21 PM   Radiology No results found.  Procedures Procedures (including critical care time)  Medications Ordered in ED Medications - No data to display   Initial Impression / Assessment and Plan / ED Course  I have reviewed the triage vital signs and the nursing notes.  Pertinent labs & imaging results that were available during my care of the patient were reviewed by me and considered in my medical decision making (see chart for details).     Patient is head CT without acute findings.  Urinalysis is pending at this time.  Electrolytes within normal limits except for mild hyperglycemia.  No evidence of leukocytosis on CBC.  Suspect that patient's symptoms are likely from being started on the new medication 2 days ago.  Will admit for further observation  Final Clinical Impressions(s) / ED Diagnoses   Final diagnoses:  None    ED Discharge Orders    None       Lacretia Leigh,  MD 09/20/18 2029

## 2018-09-20 NOTE — ED Notes (Addendum)
Pt started on new medicine 2 days ago with his PCP for "his memory issues".  States patient has told her a few times yesterday that he was concerned she was plotting to kill him.  States he would not take his BP meds and other meds today due to stating he knew he had already taken them.

## 2018-09-20 NOTE — ED Notes (Signed)
Left number for call back for report 

## 2018-09-20 NOTE — H&P (Signed)
TRH H&P   Patient Demographics:    Tony Davis, is a 57 y.o. male  MRN: [id number]   DOB - 06-26-1961  Admit Date - 09/20/2018  Outpatient Primary MD for the patient is Aletha Halim., PA-C  Referring MD/NP/PA: Vivi Martens  Outpatient Specialists:    Dr. Everette Rank  Patient coming from: home  Chief Complaint  Patient presents with  . Altered Mental Status      HPI:    Tony Davis  is a 57 y.o. male, w hypertension, hyperlipidemia, Dm2, CVA , ? NPH, rheumatoid arthritis, DVT apparently presents with paranoia and AMS per family.  Pt has had symptoms for the past 2 days.  Pt noted to be started on Aricept this past Wednesday.    In Ed,  T 98  P 68  Bp 147/88  Pox 98% on RA  CT brain IMPRESSION: 1. No acute findings. No intracranial mass, hemorrhage or edema. Ventriculostomy shunt is stable in position, with stable size and configuration of the ventricles. 2. Chronic small vessel ischemic change in the white matter.  Wbc 7.7, Hgb 14.6, Plt 272 Na 139, K 3.5,  Bun 14, Creatinine 1.25 Glucose 132  Ast 20, Alt 19 Alk phos 101, T. Bili 0.8  Trop 0.00  Urinalysis pending UDS pending  Pt will be admitted for altered mental status secondaryt to possibly aricept.     Review of systems:    In addition to the HPI above,  No Fever-chills, No Headache, No changes with Vision or hearing, No problems swallowing food or Liquids, No Chest pain, Cough or Shortness of Breath, No Abdominal pain, No Nausea or Vommitting, Bowel movements are regular, No Blood in stool or Urine, No dysuria, No new skin rashes or bruises, No new joints pains-aches,  No new weakness, tingling, numbness in any extremity, No recent weight gain or loss, No polyuria, polydypsia or polyphagia, No significant Mental Stressors.  A full 10 point Review of Systems was done, except as stated  above, all other Review of Systems were negative.   With Past History of the following :    Past Medical History:  Diagnosis Date  . Acute respiratory failure ([place]) 2015   a. Following BG hemorrhage - s/p trache with decannulation.  . Arthritis    "knees" (09/25/2013)  . Basal ganglia hemorrhage ([place]) 2015   a. 2015 - with intraventricular extension and hydrocephalus requiring right crani and VP shunt, complicated by encephalopathy, protein calorie malnutrition, acute resp failure req trache and PEG, BLE DVT s/p IVC, enterobacter PNA, subsequent right hemiparesis and difficulty communicating.  Marland Kitchen BPH (benign prostatic hyperplasia)   . CAD (coronary artery disease)    a. s/p promus DES circumflex artery 10/13/10. b. Cath 04/2013: stable disease but possible small vessel disease -Imdur added.  . Carotid artery disease ([place])    a. 1-29% BICA by study 04/2014.  Marland Kitchen  CVA (cerebral vascular accident) ([place]) 02/2010   a. CVA 2011, prior reported TIAs. b. CNS hemorrhage 2015.  Marland Kitchen CVA (cerebral vascular accident) ([place]) 06/2014   "hemorrhagic; in [place]; brain activity diminshed; ability to do anything; can't do ADLs on own"  . DVT (deep venous thrombosis) ([place]) 2013   a. 2013. b. 2015 - reported BLE DVT s/p IVC filter.   . Enterobacter cloacae pneumonia ([place]) 2015  . Falls   . Folate deficiency anemia   . GERD (gastroesophageal reflux disease)   . Hematuria 8/12  . HLD (hyperlipidemia)   . HTN (hypertension)   . Hydrocephalus ([place]) 2015   a. 2/2 BG hemorrhage.  . Incontinence of feces   . Lower extremity edema   . LV dysfunction    a. 2011 - EF 45-50%, subsequent 55-60% in 04/2014.  Marland Kitchen Noncompliance   . Orthostatic hypotension   . OSA on CPAP    "not using CPAP right now" (11/30/2014)  . PFO (patent foramen ovale)    a. echo 8/12: EF 55-65%, grade 2 diast dysfnx; +PFO on bubble study. b. TEE 4/13: EF normal, atrial septum with suspicion for interatrial septum fenestrations without flow across  and few large bubbles noted in LA.  This was not felt to require closure   . Protein calorie malnutrition ([place])   . Rheumatoid arthritis(714.0)   . S/P percutaneous endoscopic gastrostomy (PEG) tube placement ([place]) 2015  . TIA (transient ischemic attack)    "1-2; both after the one in 02/2010" (09/25/2013)  . Type II diabetes mellitus ([place])   . Urinary incontinence       Past Surgical History:  Procedure Laterality Date  . CORONARY ANGIOPLASTY WITH STENT PLACEMENT  2012   "1" (09/24/2013)  . KNEE ARTHROSCOPY Bilateral 2000's   2 on left and 3 on rt  . LEFT HEART CATHETERIZATION WITH CORONARY ANGIOGRAM N/A 04/24/2013   Procedure: LEFT HEART CATHETERIZATION WITH CORONARY ANGIOGRAM;  Surgeon: Burnell Blanks, MD;  Location[name] CATH LAB;  Service: Cardiovascular;  Laterality: N/A;  . TEE WITHOUT CARDIOVERSION  03/21/2012   Procedure: TRANSESOPHAGEAL ECHOCARDIOGRAM (TEE);  Surgeon: Fay Records, MD;  Location[facility] ENDOSCOPY;  Service: Cardiovascular;  Laterality: N/A;      Social History:     Social History   Tobacco Use  . Smoking status: Former Smoker    Packs/day: 1.00    Years: 35.00    Pack years: 35.00    Types: Cigarettes    Last attempt to quit: 03/10/2012    Years since quitting: 6.5  . Smokeless tobacco: Never Used  Substance Use Topics  . Alcohol use: No    Alcohol/week: 0.0 standard drinks     Lives - at home  Mobility - walks by self   Family History :     Family History  Problem Relation Age of Onset  . Heart attack Father   . Stroke Father   . Heart attack Sister   . Hypertension Brother   . Seizures Sister   . Stroke Sister   . Cancer Neg Hx   . Kidney disease Neg Hx   . Diabetes Neg Hx       Home Medications:   Prior to Admission medications   Medication Sig Start Date End Date Taking? Authorizing Provider  aspirin EC 81 MG tablet Take 81 mg by mouth daily.   Yes [provider]  atorvastatin (LIPITOR) 80 MG tablet TAKE ONE  TABLET BY MOUTH DAILY 08/16/18  Yes [name],  Arvid Right, MD  donepezil (ARICEPT) 5 MG tablet Take 5 mg by mouth daily. 09/17/18  Yes [provider]  escitalopram (LEXAPRO) 20 MG tablet Take 20 mg by mouth daily.   Yes [provider]  hydrALAZINE (APRESOLINE) 50 MG tablet Take 1.5 tablets (75 mg total) by mouth 3 (three) times daily. 07/07/18  Yes Burnell Blanks, MD  lisinopril (PRINIVIL,ZESTRIL) 40 MG tablet Take 40 mg by mouth daily.  04/15/18  Yes [provider]  metoprolol succinate (TOPROL-XL) 100 MG 24 hr tablet Take 1 tablet (100 mg total) by mouth 2 (two) times daily. 07/07/18  Yes Burnell Blanks, MD  potassium chloride (K-DUR) 10 MEQ tablet TAKE ONE TABLET BY MOUTH DAILY Patient taking differently: Take 10 mEq by mouth daily.  08/16/18  Yes Janith Lima, MD     Allergies:     Allergies  Allergen Reactions  . Iodine Anaphylaxis  . Amlodipine Swelling and Other (See Comments)    Foot edema     Physical Exam:   Vitals  Blood pressure (!) 147/88, pulse 68, temperature 98 F (36.7 C), temperature source Oral, resp. rate 16, SpO2 98 %.   1. General  lying in bed somnolent Seems slightly paranoid, no obvious hallucination.   2. Normal affect and insight, Not Suicidal or Homicidal, Awake Alert, Oriented X 3.  3. No F.N deficits, ALL C.Nerves Intact, Strength 5/5 all 4 extremities, Sensation intact all 4 extremities, Plantars down going.  4. Ears and Eyes appear Normal, Conjunctivae clear, PERRLA. Moist Oral Mucosa.  5. Supple Neck, No JVD, No cervical lymphadenopathy appriciated, No Carotid Bruits.  6. Symmetrical Chest wall movement, Good air movement bilaterally, CTAB.  7. RRR, No Gallops, Rubs or Murmurs, No Parasternal Heave.  8. Positive Bowel Sounds, Abdomen Soft, No tenderness, No organomegaly appriciated,No rebound -guarding or rigidity.  9.  No Cyanosis, Normal Skin Turgor, No Skin Rash or Bruise.  10. Good muscle tone,   joints appear normal , no effusions, Normal ROM.  11. No Palpable Lymph Nodes in Neck or Axillae      Data Review:    CBC Recent Labs  Lab 09/20/18 1800 09/20/18 1817  WBC 7.7  --   HGB 14.6 15.3  HCT 46.3 45.0  PLT 272  --   MCV 95.7  --   MCH 30.2  --   MCHC 31.5  --   RDW 12.5  --   LYMPHSABS 1.6  --   MONOABS 0.4  --   EOSABS 0.2  --   BASOSABS 0.0  --    ------------------------------------------------------------------------------------------------------------------  Chemistries  Recent Labs  Lab 09/20/18 1800 09/20/18 1817  NA 139 141  K 3.5 3.5  CL 102 101  CO2 27  --   GLUCOSE 132* 131*  BUN 14 16  CREATININE 1.25* 1.20  CALCIUM 9.2  --   AST 20  --   ALT 19  --   ALKPHOS 101  --   BILITOT 0.8  --    ------------------------------------------------------------------------------------------------------------------ CrCl cannot be calculated (Unknown ideal weight.). ------------------------------------------------------------------------------------------------------------------ No results for input(s): TSH, T4TOTAL, T3FREE, THYROIDAB in the last 72 hours.  Invalid input(s): FREET3  Coagulation profile Recent Labs  Lab 09/20/18 1800  INR 1.02   ------------------------------------------------------------------------------------------------------------------- No results for input(s): DDIMER in the last 72 hours. -------------------------------------------------------------------------------------------------------------------  Cardiac Enzymes No results for input(s): CKMB, TROPONINI, MYOGLOBIN in the last 168 hours.  Invalid input(s): CK ------------------------------------------------------------------------------------------------------------------    Component Value Date/Time   BNP 25.8  11/30/2014 1630   BNP 9.4 11/03/2014 1716      ---------------------------------------------------------------------------------------------------------------  Urinalysis    Component Value Date/Time   COLORURINE YELLOW 03/24/2018 1848   APPEARANCEUR HAZY (A) 03/24/2018 1848   LABSPEC 1.019 03/24/2018 1848   PHURINE 6.0 03/24/2018 1848   GLUCOSEU NEGATIVE 03/24/2018 1848   GLUCOSEU NEGATIVE 12/26/2015 1659   HGBUR LARGE (A) 03/24/2018 1848   BILIRUBINUR NEGATIVE 03/24/2018 1848   KETONESUR NEGATIVE 03/24/2018 1848   PROTEINUR 100 (A) 03/24/2018 1848   UROBILINOGEN 0.2 12/26/2015 1659   NITRITE NEGATIVE 03/24/2018 1848   LEUKOCYTESUR SMALL (A) 03/24/2018 1848    ----------------------------------------------------------------------------------------------------------------   Imaging Results:    Ct Head Wo Contrast  Result Date: 09/20/2018 CLINICAL DATA:  Acute headache.  History of CVA. EXAM: CT HEAD WITHOUT CONTRAST TECHNIQUE: Contiguous axial images were obtained from the base of the skull through the vertex without intravenous contrast. COMPARISON:  Head CT dated 03/24/2018. FINDINGS: Brain: Ventricles are stable in size and configuration. RIGHT frontal approach ventriculostomy catheter is stable in position at the frontal horn of the RIGHT lateral ventricle. Mild chronic small vessel ischemic change within the periventricular white matter regions. No mass, hemorrhage, edema or other evidence of acute parenchymal abnormality. No extra-axial hemorrhage. Vascular: No hyperdense vessel or unexpected calcification. Skull: No acute findings. Sinuses/Orbits: No acute finding. Other: None. IMPRESSION: 1. No acute findings. No intracranial mass, hemorrhage or edema. Ventriculostomy shunt is stable in position, with stable size and configuration of the ventricles. 2. Chronic small vessel ischemic change in the white matter. Electronically Signed   By: Franki Cabot M.D.   On: 09/20/2018 19:39     nsr at 64, borderline LAD, poor R  progression   Assessment & Plan:    Principal Problem:   Altered mental status    AMS Check tsh, b12, folate, esr, rpr STOP ARICEPT Psychiatry consult ordered in computer  CVA Cont Aspirin Cont Lipitor  Hypertension Cont Lisinopril 58m po qhs Cont Metroprolol XL 1055mpo bid Cont Hydralazine 7552mo tid  Anxiety/Depression Cont Lexapro 10m37m qday  Hyperglycemia  ? H/o Dm2 Check hga1c If elevated then please start on FSBS and SSI   DVT Prophylaxis Lovenox - SCDs   AM Labs Ordered, also please review Full Orders  Family Communication: Admission, patients condition and plan of care including tests being ordered have been discussed with the patient and wife who indicate understanding and agree with the plan and Code Status.  Code Status   FULL CODE  Likely DC to  home  Condition GUARDED    Consults called:   Psychiatry consult placed in computer  Admission status: observation  Time spent in minutes : 70   JameJani Gravel on 09/20/2018 at 8:31 PM  Between 7am to 7pm - Pager - [phone] 7pm go to [email or web address] - password [email or web address] - Office  [phone]

## 2018-09-20 NOTE — ED Triage Notes (Signed)
Pt presents to ED for assessment of altered mental status x 2 days.  Pt's wife states he is disoriented, not following commands appropriately at all times, and patient states he thought he was here for elevated BP.  Hx of hemorrhagic stroke.

## 2018-09-21 ENCOUNTER — Other Ambulatory Visit: Payer: Self-pay

## 2018-09-21 DIAGNOSIS — I5032 Chronic diastolic (congestive) heart failure: Secondary | ICD-10-CM

## 2018-09-21 DIAGNOSIS — I610 Nontraumatic intracerebral hemorrhage in hemisphere, subcortical: Secondary | ICD-10-CM | POA: Diagnosis not present

## 2018-09-21 DIAGNOSIS — I10 Essential (primary) hypertension: Secondary | ICD-10-CM | POA: Diagnosis not present

## 2018-09-21 DIAGNOSIS — R410 Disorientation, unspecified: Secondary | ICD-10-CM

## 2018-09-21 LAB — COMPREHENSIVE METABOLIC PANEL
ALK PHOS: 94 U/L (ref 38–126)
ALT: 19 U/L (ref 0–44)
AST: 22 U/L (ref 15–41)
Albumin: 3.6 g/dL (ref 3.5–5.0)
Anion gap: 8 (ref 5–15)
BILIRUBIN TOTAL: 1.3 mg/dL — AB (ref 0.3–1.2)
BUN: 13 mg/dL (ref 6–20)
CALCIUM: 8.9 mg/dL (ref 8.9–10.3)
CO2: 29 mmol/L (ref 22–32)
CREATININE: 1.07 mg/dL (ref 0.61–1.24)
Chloride: 103 mmol/L (ref 98–111)
GFR calc Af Amer: >60 mL/min (ref >60)
GLUCOSE: 96 mg/dL (ref 70–99)
Potassium: 3.4 mmol/L — ABNORMAL LOW (ref 3.5–5.1)
Sodium: 140 mmol/L (ref 135–145)
Total Protein: 6.6 g/dL (ref 6.5–8.1)

## 2018-09-21 LAB — RPR: RPR Ser Ql: NONREACTIVE

## 2018-09-21 LAB — HIV ANTIBODY (ROUTINE TESTING W REFLEX): HIV Screen 4th Generation wRfx: NONREACTIVE

## 2018-09-21 LAB — CBC
HCT: 41.3 % (ref 39.0–52.0)
Hemoglobin: 13.4 g/dL (ref 13.0–17.0)
MCH: 29.7 pg (ref 26.0–34.0)
MCHC: 32.4 g/dL (ref 30.0–36.0)
MCV: 91.6 fL (ref 80.0–100.0)
NRBC: 0 % (ref 0.0–0.2)
PLATELETS: 281 10*3/uL (ref 150–400)
RBC: 4.51 MIL/uL (ref 4.22–5.81)
RDW: 12.5 % (ref 11.5–15.5)
WBC: 7.1 10*3/uL (ref 4.0–10.5)

## 2018-09-21 LAB — SEDIMENTATION RATE: Sed Rate: 17 mm/hr — ABNORMAL HIGH (ref 0–16)

## 2018-09-21 LAB — HEMOGLOBIN A1C
HEMOGLOBIN A1C: 5.7 % — AB (ref 4.8–5.6)
Mean Plasma Glucose: 116.89 mg/dL

## 2018-09-21 LAB — VITAMIN B12: VITAMIN B 12: 191 pg/mL (ref 180–914)

## 2018-09-21 LAB — TSH: TSH: 0.684 u[IU]/mL (ref 0.350–4.500)

## 2018-09-21 MED ORDER — INFLUENZA VAC SPLIT QUAD 0.5 ML IM SUSY: 0.5000 mL | PREFILLED_SYRINGE | INTRAMUSCULAR | Status: DC

## 2018-09-21 MED ORDER — POTASSIUM CHLORIDE CRYS ER 20 MEQ PO TBCR
40.0000 meq | EXTENDED_RELEASE_TABLET | Freq: Once | ORAL | Status: AC
  Administered 2018-09-21: 40 meq via ORAL
  Filled 2018-09-21: qty 2

## 2018-09-21 NOTE — Progress Notes (Signed)
Ambulated patient 200 feet in hallway on room air. Patient O2 saturation dropped to 86%.   While resting in bed on room air in hallway, patient O2 saturation remained at 94-95%.   Ginette Pitman RN

## 2018-09-21 NOTE — Evaluation (Signed)
Physical Therapy Evaluation Patient Details Name: Tony Davis. MRN: [id number] DOB: 1961-09-26 Today's Date: 09/21/2018   History of Present Illness    57 y.o.male,whypertension, hyperlipidemia, Dm2, CVA , ? NPH, rheumatoid arthritis, recent basal ganglia hemorrhagic CVA requiring shunt placement, history of DVT apparently presented with paranoia and AMS per family. Pt has had symptoms for the past 2 days. Pt noted to be started on Tony Davis this past Wednesday.  D/C ordered this date   Clinical Impression  Pt presents with mild limitations, likely at baseline and no need for physical assistance for any mobility task.  Pt will have family support at d/c and while he may not need ongoing PT at d/c, agree with safety eval if Tony Davis is able to provide.  No other recommendations.   MD: Pt holds left eye closed, able to open and appreciated redness in medial corner, pt feels like "dust or something" is in eye and is watering liberally.  Please address.      Follow Up Recommendations Home health PT(MD order for Tony Davis: Pt may benefit from safety eval)    Equipment Recommendations  None recommended by PT    Recommendations for Other Davis       Precautions / Restrictions Precautions Precautions: None Restrictions Weight Bearing Restrictions: No      Mobility  Bed Mobility Overal bed mobility: Independent                Transfers Overall transfer level: Independent Equipment used: None             General transfer comment: stands from/sits to recliner with no problems or limitations noted.  Ambulation/Gait Ambulation/Gait assistance: Supervision Gait Distance (Feet): 150 Feet Assistive device: None Gait Pattern/deviations: Trunk flexed;Step-through pattern   Gait velocity interpretation: >2.62 ft/sec, indicative of community ambulatory General Gait Details: mild waddling gait indicating lack or pelvic rotation but does not limit independence  or contribute to any instability.  Pt denies SOB, no s/s noted. able to walk/talk without hesitation in gait.  Denies instabilty at home (no need to stabilize with furniture) and does not endorse conerns about falling  Financial trader Rankin (Stroke Patients Only)       Balance Overall balance assessment: No apparent balance deficits (not formally assessed)                                           Pertinent Vitals/Pain Pain Assessment: No/denies pain    Home Living Family/patient expects to be discharged to:: Private residence Living Arrangements: Spouse/significant other;Children Available Help at Discharge: Family;Available 24 hours/day Type of Home: House Home Access: Stairs to enter Entrance Stairs-Rails: Psychiatric nurse of Steps: 7 Home Layout: Two level;Able to live on main level with bedroom/bathroom Home Equipment: Tony Davis;Cane - single point;Tub bench;Adaptive equipment Additional Comments: used assitive devices after surgery in past, does not use or need currently    Prior Function Level of Independence: Independent               Hand Dominance   Dominant Hand: Right    Extremity/Trunk Assessment   Upper Extremity Assessment Upper Extremity Assessment: Overall WFL for tasks assessed    Lower Extremity Assessment Lower Extremity Assessment: Overall WFL for tasks assessed    Cervical /  Trunk Assessment Cervical / Trunk Assessment: Normal  Communication   Communication: No difficulties  Cognition Arousal/Alertness: Awake/alert Behavior During Therapy: WFL for tasks assessed/performed Overall Cognitive Status: Within Functional Limits for tasks assessed                                 General Comments: note concerns about acute AMS with hx mild confusion.  Pt presents with no apparent difficulties, agree likely at baseline      General Comments  General comments (skin integrity, edema, etc.): Pt holds Left eye closed, but able to open and redness appreciated medial side; pt state feels like "dust or something" causing symptoms    Exercises     Assessment/Plan    PT Assessment Patient needs continued PT Davis  PT Problem List Decreased mobility       PT Treatment Interventions      PT Goals (Current goals can be found in the Care Plan section)  Acute Rehab PT Goals PT Goal Formulation: All assessment and education complete, DC therapy    Frequency     Barriers to discharge        Co-evaluation               AM-PAC PT "6 Clicks" Daily Activity  Outcome Measure Difficulty turning over in bed (including adjusting bedclothes, sheets and blankets)?: None Difficulty moving from lying on back to sitting on the side of the bed? : None Difficulty sitting down on and standing up from a chair with arms (e.g., wheelchair, bedside commode, etc,.)?: None Help needed moving to and from a bed to chair (including a wheelchair)?: None Help needed walking in hospital room?: None Help needed climbing 3-5 steps with a railing? : None 6 Click Score: 24    End of Session   Activity Tolerance: Patient tolerated treatment well Patient left: in chair;with call bell/phone within reach(foley catheter in place, advised to call for assist if up) Nurse Communication: Mobility status;Other (comment)(Left eye symptoms) PT Visit Diagnosis: Difficulty in walking, not elsewhere classified (R26.2)    Time: 9794-8016 PT Time Calculation (min) (ACUTE ONLY): 16 min   Charges:   PT Evaluation $PT Eval Low Complexity: 1 Low          Kearney Hard, PT, DPT, MS Board Certified Geriatric Clinical Specialist  [name] 09/21/2018, 10:28 AM

## 2018-09-21 NOTE — Discharge Instructions (Signed)
Follow with Primary MD Aletha Halim., PA-C in 7 days, do not drive or operate heavy machinery you have seen your neurologist and have been cleared to do so.  Get CBC, CMP, 2 view Chest X ray checked  by Primary MD  in 5-7 days  Activity: As tolerated with Full fall precautions use walker/cane & assistance as needed  Disposition Home    Diet: Heart Healthy low carbohydrate.  For Heart failure patients - Check your Weight same time everyday, if you gain over 2 pounds, or you develop in leg swelling, experience more shortness of breath or chest pain, call your Primary MD immediately. Follow Cardiac Low Salt Diet and 1.5 lit/day fluid restriction.  Special Instructions: If you have smoked or chewed Tobacco  in the last 2 yrs please stop smoking, stop any regular Alcohol  and or any Recreational drug use.  On your next visit with your primary care physician please Get Medicines reviewed and adjusted.  Please request your Prim.MD to go over all Hospital Tests and Procedure/Radiological results at the follow up, please get all Hospital records sent to your Prim MD by signing hospital release before you go home.  If you experience worsening of your admission symptoms, develop shortness of breath, life threatening emergency, suicidal or homicidal thoughts you must seek medical attention immediately by calling 911 or calling your MD immediately  if symptoms less severe.  You Must read complete instructions/literature along with all the possible adverse reactions/side effects for all the Medicines you take and that have been prescribed to you. Take any new Medicines after you have completely understood and accpet all the possible adverse reactions/side effects.

## 2018-09-21 NOTE — Progress Notes (Signed)
Nsg Discharge Note  Admit Date:  09/20/2018 Discharge date: 09/21/2018   Tony Davis. to be D/C'd home per MD order.  AVS completed.  Copy for chart, and copy for patient signed, and dated. Patient/caregiver able to verbalize understanding.  Discharge Medication: Allergies as of 09/21/2018      Reactions   Iodine Anaphylaxis   Amlodipine Swelling, Other (See Comments)   Foot edema      Medication List    STOP taking these medications   donepezil 5 MG tablet Commonly known as:  ARICEPT     TAKE these medications   aspirin EC 81 MG tablet Take 81 mg by mouth daily.   atorvastatin 80 MG tablet Commonly known as:  LIPITOR TAKE ONE TABLET BY MOUTH DAILY   escitalopram 20 MG tablet Commonly known as:  LEXAPRO Take 20 mg by mouth daily.   hydrALAZINE 50 MG tablet Commonly known as:  APRESOLINE Take 1.5 tablets (75 mg total) by mouth 3 (three) times daily.   lisinopril 40 MG tablet Commonly known as:  PRINIVIL,ZESTRIL Take 40 mg by mouth daily.   metoprolol succinate 100 MG 24 hr tablet Commonly known as:  TOPROL-XL Take 1 tablet (100 mg total) by mouth 2 (two) times daily.   potassium chloride 10 MEQ tablet Commonly known as:  K-DUR TAKE ONE TABLET BY MOUTH DAILY       Discharge Assessment: Vitals:   09/21/18 0047 09/21/18 0519  BP: (!) 148/83 (!) 142/94  Pulse:  66  Resp:  (!) 22  Temp:  98.1 F (36.7 C)  SpO2:  98%   Skin clean, dry and intact without evidence of skin break down, no evidence of skin tears noted. IV catheter discontinued intact. Site without signs and symptoms of complications. Dressing with slight pressure applied.  D/c Instructions-Education: Discharge instructions given to patient/family with verbalized understanding. D/c education completed with patient/family including follow up instructions, medication list, d/c activities limitations if indicated, with other d/c instructions as indicated by MD - patient able to verbalize  understanding, all questions fully answered. Patient instructed to return to ED, call 911, or call MD for any changes in condition.  Patient escorted to front entrance via wheelchair and left hospital via private auto.   Ginette Pitman, RN 09/21/2018 1:42 PM

## 2018-09-21 NOTE — Discharge Summary (Signed)
Tony Davis. LPF:[id number] DOB: 14-Oct-1961 DOA: 09/20/2018  PCP: Aletha Halim., PA-C  Admit date: 09/20/2018  Discharge date: 09/21/2018  Admitted From: Home   Disposition:  Home   Recommendations for Outpatient Follow-up:   Follow up with PCP in 1-2 weeks  PCP Please obtain BMP/CBC, 2 view CXR in 1week,  (see Discharge instructions)   PCP Please follow up on the following pending results:     Home Health: Pt, RN, Aide, S -Work  Equipment/Devices: None  Consultations: None Discharge Condition: Stable   CODE STATUS: Ful   Diet Recommendation: Heart Healthy Low Carb  Diet Order            Diet heart healthy/carb modified Room service appropriate? Yes; Fluid consistency: Thin  Diet effective now               Chief Complaint  Patient presents with  . Altered Mental Status     Brief history of present illness from the day of admission and additional interim summary     Tony Davis  is a 57 y.o. male, w hypertension, hyperlipidemia, Dm2, CVA , ? NPH, rheumatoid arthritis, recent basal ganglia hemorrhagic CVA requiring shunt placement, history of DVT apparently presented with paranoia and AMS per family.  Pt has had symptoms for the past 2 days.  Pt noted to be started on Aricept this past Wednesday.                                                                  Hospital Course    1.  Toxic encephalopathy versus mild delirium in a patient with a recent basal ganglia hemorrhagic CVA requiring ventriculostomy shunt placement.  Detailed discussions with patient's wife bedside, he has had this issue once before after his stroke, he had seen his neurologist and was started on Aricept 2 days prior to this current episode of paranoia, offending medication was held, head CT was unremarkable, exam  was nonfocal, he is now close to his baseline although still gets mildly confused at times but overall close to his baseline.  Will hold Aricept upon discharge and have him follow with his primary neurologist within a week.  Note this might be patient's new baseline after his recent large stroke.  2.  Pretension.  Home medications continued he is currently on combination of metoprolol, lisinopril and hydralazine.  PCP to monitor and adjust.  3.  3 of CVA.  Basal ganglia hemorrhagic CVA with ventriculostomy shunt.  Continue aspirin and statin.  Head CT nonacute.  4.  Anxiety and depression.  Continue home dose Lexapro.  5.  ?? Diet-controlled DM type II mentioned in his chart.  Follow with PCP.  A1c is 5.7 doubt the diagnosis.  Lab Results  Component Value Date   HGBA1C  5.7 (H) 09/21/2018    Discharge diagnosis     Principal Problem:   Altered mental status Active Problems:   Basal ganglia hemorrhage (HCC)   Essential hypertension   Diastolic dysfunction with chronic heart failure ([place])   Hypokalemia    Discharge instructions    Discharge Instructions    Discharge instructions   Complete by:  As directed    Follow with Primary MD Aletha Halim., PA-C in 7 days, do not drive or operate heavy machinery you have seen your neurologist and have been cleared to do so.  Get CBC, CMP, 2 view Chest X ray checked  by Primary MD  in 5-7 days  Activity: As tolerated with Full fall precautions use walker/cane & assistance as needed  Disposition Home    Diet: Heart Healthy low carbohydrate.  For Heart failure patients - Check your Weight same time everyday, if you gain over 2 pounds, or you develop in leg swelling, experience more shortness of breath or chest pain, call your Primary MD immediately. Follow Cardiac Low Salt Diet and 1.5 lit/day fluid restriction.  Special Instructions: If you have smoked or chewed Tobacco  in the last 2 yrs please stop smoking, stop any regular Alcohol   and or any Recreational drug use.  On your next visit with your primary care physician please Get Medicines reviewed and adjusted.  Please request your Prim.MD to go over all Hospital Tests and Procedure/Radiological results at the follow up, please get all Hospital records sent to your Prim MD by signing hospital release before you go home.  If you experience worsening of your admission symptoms, develop shortness of breath, life threatening emergency, suicidal or homicidal thoughts you must seek medical attention immediately by calling 911 or calling your MD immediately  if symptoms less severe.  You Must read complete instructions/literature along with all the possible adverse reactions/side effects for all the Medicines you take and that have been prescribed to you. Take any new Medicines after you have completely understood and accpet all the possible adverse reactions/side effects.   Increase activity slowly   Complete by:  As directed       Discharge Medications   Allergies as of 09/21/2018      Reactions   Iodine Anaphylaxis   Amlodipine Swelling, Other (See Comments)   Foot edema      Medication List    STOP taking these medications   donepezil 5 MG tablet Commonly known as:  ARICEPT     TAKE these medications   aspirin EC 81 MG tablet Take 81 mg by mouth daily.   atorvastatin 80 MG tablet Commonly known as:  LIPITOR TAKE ONE TABLET BY MOUTH DAILY   escitalopram 20 MG tablet Commonly known as:  LEXAPRO Take 20 mg by mouth daily.   hydrALAZINE 50 MG tablet Commonly known as:  APRESOLINE Take 1.5 tablets (75 mg total) by mouth 3 (three) times daily.   lisinopril 40 MG tablet Commonly known as:  PRINIVIL,ZESTRIL Take 40 mg by mouth daily.   metoprolol succinate 100 MG 24 hr tablet Commonly known as:  TOPROL-XL Take 1 tablet (100 mg total) by mouth 2 (two) times daily.   potassium chloride 10 MEQ tablet Commonly known as:  K-DUR TAKE ONE TABLET BY MOUTH  DAILY       Follow-up Information    Aletha Halim., PA-C. Schedule an appointment as soon as possible for a visit in 1 week(s).   Specialty:  Family Medicine Contact  information: [address] [place] [address] [phone]        Karl Luke, MD. Schedule an appointment as soon as possible for a visit in 1 week(s).   Specialty:  Neurology Contact information: [place] [address] [phone]           Major procedures and Radiology Reports - PLEASE review detailed and final reports thoroughly  -      Ct Head Wo Contrast  Result Date: 09/20/2018 CLINICAL DATA:  Acute headache.  History of CVA. EXAM: CT HEAD WITHOUT CONTRAST TECHNIQUE: Contiguous axial images were obtained from the base of the skull through the vertex without intravenous contrast. COMPARISON:  Head CT dated 03/24/2018. FINDINGS: Brain: Ventricles are stable in size and configuration. RIGHT frontal approach ventriculostomy catheter is stable in position at the frontal horn of the RIGHT lateral ventricle. Mild chronic small vessel ischemic change within the periventricular white matter regions. No mass, hemorrhage, edema or other evidence of acute parenchymal abnormality. No extra-axial hemorrhage. Vascular: No hyperdense vessel or unexpected calcification. Skull: No acute findings. Sinuses/Orbits: No acute finding. Other: None. IMPRESSION: 1. No acute findings. No intracranial mass, hemorrhage or edema. Ventriculostomy shunt is stable in position, with stable size and configuration of the ventricles. 2. Chronic small vessel ischemic change in the white matter. Electronically Signed   By: Franki Cabot M.D.   On: 09/20/2018 19:39    Micro Results   No results found for this or any previous visit (from the past 240 hour(s)).  Today   Subjective    [name] today has no headache,no chest abdominal pain,no new weakness tingling or numbness, feels much better  wants to go home today.    Objective   Blood pressure (!) 142/94, pulse 66, temperature 98.1 F (36.7 C), temperature source Oral, resp. rate (!) 22, height 5\' 11"  (1.803 m), weight 115.2 kg, SpO2 98 %.  No intake or output data in the 24 hours ending 09/21/18 0851  Exam Awake Alert, Oriented x 2, No new F.N deficits, Normal affect [place].AT,PERRAL Supple Neck,No JVD, No cervical lymphadenopathy appriciated.  Symmetrical Chest wall movement, Good air movement bilaterally, CTAB RRR,No Gallops,Rubs or new Murmurs, No Parasternal Heave +ve B.Sounds, Abd Soft, Non tender, No organomegaly appriciated, No rebound -guarding or rigidity. No Cyanosis, Clubbing or edema, No new Rash or bruise   Data Review   CBC w Diff:  Lab Results  Component Value Date   WBC 7.1 09/21/2018   HGB 13.4 09/21/2018   HCT 41.3 09/21/2018   PLT 281 09/21/2018   LYMPHOPCT 20 09/20/2018   MONOPCT 6 09/20/2018   EOSPCT 3 09/20/2018   BASOPCT 1 09/20/2018    CMP:  Lab Results  Component Value Date   NA 140 09/21/2018   NA 142 04/18/2018   K 3.4 (L) 09/21/2018   CL 103 09/21/2018   CO2 29 09/21/2018   BUN 13 09/21/2018   BUN 17 04/18/2018   CREATININE 1.07 09/21/2018   CREATININE 1.08 06/17/2015   PROT 6.6 09/21/2018   ALBUMIN 3.6 09/21/2018   BILITOT 1.3 (H) 09/21/2018   ALKPHOS 94 09/21/2018   AST 22 09/21/2018   ALT 19 09/21/2018  .   Total Time in preparing paper work, data evaluation and todays exam - 60 minutes  Lala Lund M.D on 09/21/2018 at 8:51 AM  [facility]   Office  [phone]

## 2018-09-21 NOTE — Care Management Note (Signed)
Case Management Note  Patient Details  Name: Tony Davis. MRN: [id number] Date of Birth: [birth year]  Subjective/Objective:                    Action/Plan:  Spoke w patient and wife at bedside. They would like to use [facility] for [facility]. Referral made to [facility], clinical liaison. They denied need for DME.   No further CM needs.   Expected Discharge Date:  09/21/18               Expected Discharge Plan:  [place]  In-House Referral:     Discharge planning Services  CM Consult  Post Acute Care Choice:    Choice offered to:  Patient, Spouse  DME Arranged:    DME Agency:     HH Arranged:  RN, PT, Nurse's Aide, Social Work [employer] Agency:  [facility]  Status of Service:  Completed, signed off  If discussed at [employer], dates discussed:    Additional Comments:  Carles Collet, RN 09/21/2018, 9:10 AM

## 2018-09-21 NOTE — Progress Notes (Signed)
Patient arrived to room 5W10 in stable condition via stretcher accompanied by wife and ED staff.  Skin check completed with I. Meslenjak, RN.  Patient and wife oriented to room, call bell and phone at bedside and patient and wife instructed on use and voiced understanding.  P.J. Linus Mako, RN

## 2018-09-26 DIAGNOSIS — N5089 Other specified disorders of the male genital organs: Secondary | ICD-10-CM | POA: Diagnosis not present

## 2018-10-22 ENCOUNTER — Ambulatory Visit: Payer: Medicare HMO | Admitting: Cardiovascular Disease

## 2018-10-22 ENCOUNTER — Encounter: Payer: Self-pay | Admitting: Cardiovascular Disease

## 2018-10-22 VITALS — BP 144/90 | HR 73 | Ht 71.0 in | Wt 256.8 lb

## 2018-10-22 DIAGNOSIS — I2510 Atherosclerotic heart disease of native coronary artery without angina pectoris: Secondary | ICD-10-CM | POA: Diagnosis not present

## 2018-10-22 DIAGNOSIS — G4730 Sleep apnea, unspecified: Secondary | ICD-10-CM

## 2018-10-22 DIAGNOSIS — I10 Essential (primary) hypertension: Secondary | ICD-10-CM

## 2018-10-22 NOTE — Progress Notes (Signed)
Chief Complaint  Patient presents with  . Follow-up    HTN    History of Present Illness: 57 yo male with a complex medical history including CAD, prior hemorrhagic CVA in 2015 with many compilcations, sleep apnea, HTN and HLD who is here today for cardiac follow up. I met him in 2011 and arranged a stress test, an echo and carotid dopplers at the first visit. The carotid dopplers were normal. He came in for his stress test and complained of neurological changes. He was sent to Dr. Stones office and had MRI/MRA of the head and neck. MRA showed no occlusive disease. MRI showed non-specific white matter changes but no clear infarct, possibly related to hypertension. His echo showed septal and inferobasal hypokinesis with mild concentric hypertrophy, EF 45-50%. Left heart cath October 2011 with severe stenosis in the mid Circumflex. A drug eluting stent was placed in the Circumflex artery. Repeat echo August 2012 with PFO with positive bubble study. I arranged for him to see Dr. Burt Knack for PFO closure but he skipped his appointment. He was in [place] April 2013 and had acute onset of left arm and leg weakness. He was admitted there. CT head without acute stroke. Venous dopplers with chronic DVT right femoral vein. Carotids with <13% RICA stenosis, LICA ok. Echo 03/14/12 in [place] with normal LV size and function with PFO with positive bubble study. He was started on coumadin and Lovenox and left AMA from the hospital there. I saw him in our office 03/19/12. At that time, he continued to have left sided weakness. He had not been compliant with plans for Lovenox and coumadin. He was admitted for Heparin and coumadin. TEE was done 03/21/12 and showed normal LVEF. The atrial septum had interatrial septum fenestrations without flow across and few large bubbles noted in LA. This was not felt to require closure. He was maintained on coumadin at that time. Venous dopplers 2013 with no DVT. He was diagnosed with  OSA and was started on CPAP. He was admitted to [facility] May 2014 with chest pain. Cardiac markers were negative. Cardiac cath 04/24/13 with stable CAD. Patent stent distal Circumflex, mild disease LAD, moderate disease RCA. He had a hemorraghic stroke in [place] in July 2015 and coumadin was stopped. He had a prolonged hospitalization with many complications following his CVA (complicated by intraventricular extension and hydrocephalus requiring VP shunt, encephalopathy, protein calorie malnutrition, acute resp failure requiring tracheostomy and PEG, bilateral LE DVT s/p IVC filter, enterobacter pneumonia, subsequent right hemiparesis and difficulty communicating). He has been followed by Neurology since then. No new neurological events between 2015 and 2019. Echo in December 2015 with normal LV size and function. Labs in April 2019 done during ED visit with confusion showed negative troponin, creatinine 1.4, Hgb 14.5. He has been followed by Neurology here in [place].He has been seen in our office most recently by [name], PA-C on 04/18/18. He had been off of Lasix with no LE edema. No cardiac complaints at that visit.  He is here today for follow up. The patient denies any chest pain, dyspnea, palpitations, lower extremity edema, orthopnea, PND, dizziness, near syncope or syncope. He continues to have daytime fatigue. His wife reports he does not use CPAP as advised and has not worn it for years.   Primary Care Physician: Aletha Halim PA-C  Past Medical History:  Diagnosis Date  . Acute respiratory failure ([place]) 2015   a. Following BG hemorrhage - s/p trache with  decannulation.  . Arthritis    "knees" (09/25/2013)  . Basal ganglia hemorrhage ([place]) 2015   a. 2015 - with intraventricular extension and hydrocephalus requiring right crani and VP shunt, complicated by encephalopathy, protein calorie malnutrition, acute resp failure req trache and PEG, BLE DVT s/p IVC, enterobacter PNA, subsequent  right hemiparesis and difficulty communicating.  Marland Kitchen BPH (benign prostatic hyperplasia)   . CAD (coronary artery disease)    a. s/p promus DES circumflex artery 10/13/10. b. Cath 04/2013: stable disease but possible small vessel disease -Imdur added.  . Carotid artery disease ([place])    a. 1-29% BICA by study 04/2014.  Marland Kitchen CVA (cerebral vascular accident) ([place]) 02/2010   a. CVA 2011, prior reported TIAs. b. CNS hemorrhage 2015.  Marland Kitchen CVA (cerebral vascular accident) ([place]) 06/2014   "hemorrhagic; in [place]; brain activity diminshed; ability to do anything; can't do ADLs on own"  . DVT (deep venous thrombosis) ([place]) 2013   a. 2013. b. 2015 - reported BLE DVT s/p IVC filter.   . Enterobacter cloacae pneumonia ([place]) 2015  . [place]   . Folate deficiency anemia   . GERD (gastroesophageal reflux disease)   . Hematuria 8/12  . HLD (hyperlipidemia)   . HTN (hypertension)   . Hydrocephalus ([place]) 2015   a. 2/2 BG hemorrhage.  . Incontinence of feces   . Lower extremity edema   . LV dysfunction    a. 2011 - EF 45-50%, subsequent 55-60% in 04/2014.  Marland Kitchen Noncompliance   . Orthostatic hypotension   . OSA on CPAP    "not using CPAP right now" (11/30/2014)  . PFO (patent foramen ovale)    a. echo 8/12: EF 55-65%, grade 2 diast dysfnx; +PFO on bubble study. b. TEE 4/13: EF normal, atrial septum with suspicion for interatrial septum fenestrations without flow across and few large bubbles noted in LA.  This was not felt to require closure   . Protein calorie malnutrition ([place])   . Rheumatoid arthritis(714.0)   . S/P percutaneous endoscopic gastrostomy (PEG) tube placement ([place]) 2015  . TIA (transient ischemic attack)    "1-2; both after the one in 02/2010" (09/25/2013)  . Type II diabetes mellitus ([place])   . Urinary incontinence     Past Surgical History:  Procedure Laterality Date  . CORONARY ANGIOPLASTY WITH STENT PLACEMENT  2012   "1" (09/24/2013)  . KNEE ARTHROSCOPY Bilateral 2000's   2 on left and 3 on  rt  . LEFT HEART CATHETERIZATION WITH CORONARY ANGIOGRAM N/A 04/24/2013   Procedure: LEFT HEART CATHETERIZATION WITH CORONARY ANGIOGRAM;  Surgeon: Burnell Blanks, MD;  Location[facility] CATH LAB;  Service: Cardiovascular;  Laterality: N/A;  . TEE WITHOUT CARDIOVERSION  03/21/2012   Procedure: TRANSESOPHAGEAL ECHOCARDIOGRAM (TEE);  Surgeon: Fay Records, MD;  Location[facility] ENDOSCOPY;  Service: Cardiovascular;  Laterality: N/A;    Current Outpatient Medications  Medication Sig Dispense Refill  . aspirin EC 81 MG tablet Take 81 mg by mouth daily.    Marland Kitchen atorvastatin (LIPITOR) 80 MG tablet TAKE ONE TABLET BY MOUTH DAILY 90 tablet 0  . escitalopram (LEXAPRO) 20 MG tablet Take 20 mg by mouth daily.    . hydrALAZINE (APRESOLINE) 50 MG tablet Take 50 mg by mouth as directed. Pt takes 2 tablets 3 times a day    . lisinopril (PRINIVIL,ZESTRIL) 40 MG tablet Take 40 mg by mouth daily.     . metoprolol succinate (TOPROL-XL) 100 MG 24 hr tablet Take 1 tablet (100 mg total) by  mouth 2 (two) times daily. 180 tablet 3  . potassium chloride (K-DUR) 10 MEQ tablet TAKE ONE TABLET BY MOUTH DAILY (Patient taking differently: TAKE ONE TABLET BY MOUTH DAILY) 90 tablet 0   No current facility-administered medications for this visit.     Allergies  Allergen Reactions  . Iodine Anaphylaxis  . Amlodipine Swelling and Other (See Comments)    Foot edema    Social History   Socioeconomic History  . Marital status: Married    Spouse name: Not on file  . Number of children: Not on file  . Years of education: Not on file  . Highest education level: Not on file  Occupational History  . Not on file  Social Needs  . Financial resource strain: Not on file  . Food insecurity:    Worry: Not on file    Inability: Not on file  . Transportation needs:    Medical: Not on file    Non-medical: Not on file  Tobacco Use  . Smoking status: Former Smoker    Packs/day: 1.00    Years: 35.00    Pack years: 35.00     Types: Cigarettes    Last attempt to quit: 03/10/2012    Years since quitting: 6.6  . Smokeless tobacco: Never Used  Substance and Sexual Activity  . Alcohol use: No    Alcohol/week: 0.0 standard drinks  . Drug use: No  . Sexual activity: Not Currently    Partners: Female  Lifestyle  . Physical activity:    Days per week: Not on file    Minutes per session: Not on file  . Stress: Not on file  Relationships  . Social connections:    Talks on phone: Not on file    Gets together: Not on file    Attends religious service: Not on file    Active member of club or organization: Not on file    Attends meetings of clubs or organizations: Not on file    Relationship status: Not on file  . Intimate partner violence:    Fear of current or ex partner: Not on file    Emotionally abused: Not on file    Physically abused: Not on file    Forced sexual activity: Not on file  Other Topics Concern  . Not on file  Social History Narrative   Married, 5 kids; lives in [place]. 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     Family History  Problem Relation Age of Onset  . Heart attack Father   . Stroke Father   . Heart attack Sister   . Hypertension Brother   . Seizures Sister   . Stroke Sister   . Cancer Neg Hx   . Kidney disease Neg Hx   . Diabetes Neg Hx     Review of Systems:  As stated in the HPI and otherwise negative.   BP (!) 144/90   Pulse 73   Ht _0  (1.803 m)   Wt 256 lb 12.8 oz (116.5 kg)   SpO2 99%   BMI 35.82 kg/m   Physical Examination:  General: Well developed, well nourished, NAD  HEENT: OP clear, mucus membranes moist  SKIN: warm, dry. No rashes. Neuro: No focal deficits  Musculoskeletal: Muscle strength 5/5 all ext  Psychiatric: Mood and affect normal  Neck: No JVD, no carotid bruits, no thyromegaly, no lymphadenopathy.  Lungs:Clear bilaterally, no wheezes, rhonci, crackles Cardiovascular: Regular rate and rhythm. No  murmurs, gallops or  rubs. Abdomen:Soft. Bowel sounds present. Non-tender.  Extremities: No lower extremity edema. Pulses are 2 + in the bilateral DP/PT.  Echo 11/10/14: Left ventricle: The cavity size was normal. There was moderate concentric hypertrophy. Systolic function was vigorous. The estimated ejection fraction was in the range of 65% to 70%. Wall motion was normal; there were no regional wall motion abnormalities. Features are consistent with a pseudonormal left ventricular filling pattern, with concomitant abnormal relaxation and increased filling pressure (grade 2 diastolic dysfunction). Doppler parameters are consistent with elevated ventricular end-diastolic filling pressure. - Aortic valve: Trileaflet; normal thickness leaflets. There was no regurgitation. - Mitral valve: Structurally normal valve. There was no regurgitation. - Left atrium: The atrium was mildly dilated. - Right ventricle: Systolic function was normal. - Right atrium: The atrium was normal in size. - Pulmonary arteries: Systolic pressure was within the normal range. - Inferior vena cava: The vessel was normal in size. - Pericardium, extracardiac: There was no pericardial effusion.  EKG:  EKG is not ordered today. The ekg ordered today demonstrates   Recent Labs: 04/18/2018: Magnesium 2.0 09/21/2018: ALT 19; BUN 13; Creatinine, Ser 1.07; Hemoglobin 13.4; Platelets 281; Potassium 3.4; Sodium 140; TSH 0.684   Lipid Panel    Component Value Date/Time   CHOL 225 (H) 07/09/2016 1654   TRIG 343.0 (H) 07/09/2016 1654   HDL 37.50 (L) 07/09/2016 1654   CHOLHDL 6 07/09/2016 1654   VLDL 68.6 (H) 07/09/2016 1654   LDLCALC 64 02/11/2014 1422   LDLDIRECT 145.0 07/09/2016 1654     Wt Readings from Last 3 Encounters:  10/22/18 256 lb 12.8 oz (116.5 kg)  09/21/18 253 lb 15.5 oz (115.2 kg)  04/18/18 249 lb 12.8 oz (113.3 kg)     Other studies Reviewed: Additional studies/ records that were reviewed today  include: . Review of the above records demonstrates:    Assessment and Plan:   1. Lower extremity edema: He has no LE edema currently. He has not been on Lasix.    2. H/o recurrent DVT : First in 2013, then BLE DVT in 2015 with IVC filter placed at that time. He is not felt to be a candidate for anticoagulation due to his CNS hemorrhage  3. History of CVA: He is followed in Neurology  4. HTN: BP has been mildly elevated at home. He is on Hydralazine, Coreg and Lisinopril. He has not tolerate Norvasc in the past. He will  Follow up in primary care for further management of his HTN.   5. CAD without angina: No chest pain. Continue ASA, statin and beta blocker  6. Sleep apnea: He has not worn CPAP in years. Will arrange sleep study and referral to our sleep specialist.   30 minutes spent reviewing his chart today. 20 minutes with face to face time.     Current medicines are reviewed at length with the patient today.  The patient does not have concerns regarding medicines.  The following changes have been made:  no change  Labs/ tests ordered today include:   Orders Placed This Encounter  Procedures  . Split night study    Disposition:   FU with me or office APP on my care team in 6 months.   Signed, Lauree Chandler, MD 10/22/2018 3:44 PM    [facility] [address], [place],   [address] Phone: [phone]; Fax: [phone]

## 2018-10-22 NOTE — Patient Instructions (Signed)
Medication Instructions:  Your physician recommends that you continue on your current medications as directed. Please refer to the Current Medication list given to you today.  If you need a refill on your cardiac medications before your next appointment, please call your pharmacy.   Lab work: none If you have labs (blood work) drawn today and your tests are completely normal, you will receive your results only by: Marland Kitchen MyChart Message (if you have MyChart) OR . A paper copy in the mail If you have any lab test that is abnormal or we need to change your treatment, we will call you to review the results.  Testing/Procedures: Your physician has recommended that you have a sleep study. This test records several body functions during sleep, including: brain activity, eye movement, oxygen and carbon dioxide blood levels, heart rate and rhythm, breathing rate and rhythm, the flow of air through your mouth and nose, snoring, body muscle movements, and chest and belly movement.    Follow-Up: At [facility], you and your health needs are our priority.  As part of our continuing mission to provide you with exceptional heart care, we have created designated Provider Care Teams.  These Care Teams include your primary Cardiologist (physician) and Advanced Practice Providers (APPs -  Physician Assistants and Nurse Practitioners) who all work together to provide you with the care you need, when you need it. You will need a follow up appointment in 6 months with Tony Copa, PA.  Please call our office 2 months in advance to schedule this appointment.   Any Other Special Instructions Will Be Listed Below (If Applicable).

## 2018-10-24 ENCOUNTER — Telehealth: Payer: Self-pay | Admitting: *Deleted

## 2018-10-24 NOTE — Telephone Encounter (Signed)
Submitted PA for sleep study to [employer] via web portal. Clinicals faxed to [phone].

## 2018-10-24 NOTE — Telephone Encounter (Signed)
-----   Message from Thompson Grayer, RN sent at 10/22/2018  3:26 PM EST ----- Regarding: sleep study Dr. Angelena Form has ordered a sleep study for this patient.

## 2018-11-10 ENCOUNTER — Telehealth: Payer: Self-pay | Admitting: *Deleted

## 2018-11-10 ENCOUNTER — Other Ambulatory Visit: Payer: Self-pay | Admitting: Internal Medicine

## 2018-11-10 DIAGNOSIS — E876 Hypokalemia: Secondary | ICD-10-CM

## 2018-11-10 NOTE — Telephone Encounter (Signed)
Staff message sent to [place]. [employer] denied in lab sleep study. [place] for [employer]. Notify ordering provider.

## 2018-11-10 NOTE — Telephone Encounter (Signed)
-----   Message from Thompson Grayer, RN sent at 10/22/2018  3:26 PM EST ----- Regarding: sleep study Dr. Angelena Form has ordered a sleep study for this patient.

## 2018-11-11 ENCOUNTER — Other Ambulatory Visit: Payer: Self-pay | Admitting: Internal Medicine

## 2018-11-11 ENCOUNTER — Telehealth: Payer: Self-pay | Admitting: *Deleted

## 2018-11-11 DIAGNOSIS — G4730 Sleep apnea, unspecified: Secondary | ICD-10-CM

## 2018-11-11 DIAGNOSIS — E785 Hyperlipidemia, unspecified: Secondary | ICD-10-CM

## 2018-11-11 NOTE — Telephone Encounter (Signed)
Called patient to give him his home sleep study date but the voice mail was full and I could not leave a message.I texted the number [phone] in his chart and asked the patient to call the office at [phone]. All the other phone numbers in his chart were not working numbers.

## 2018-11-11 NOTE — Telephone Encounter (Signed)
-----   Message from Lauralee Evener, [place] sent at 11/10/2018 11:03 AM EST ----- Regarding: RE: sleep study [employer] denied in lab sleep study. Recommend HST. HST does not need PA. Please notify ordering provider. ----- Message ----- From: Thompson Grayer, RN Sent: 10/22/2018   3:26 PM EST To: Thompson Grayer, RN, Cv Div Sleep Studies Subject: sleep study                                    Dr. Angelena Form has ordered a sleep study for this patient.

## 2018-11-13 NOTE — Telephone Encounter (Addendum)
  Tony Blanks, MD  Freada Bergeron, CMA  Cc: Thompson Grayer, RN        OK with me. Thanks.     ----- Message -----  From: Freada Bergeron, CMA  Sent: 11/11/2018  3:48 PM EST  To: Thompson Grayer, RN, *  Subject: FW: sleep study                  Approved a home sleep study, is this ok?       Return Call: Patient/Daughter per dpr is aware and agreeable to Home Sleep Study through [facility]. Patient is scheduled for 12/18 at 12p to pick up home sleep kit and meet with Respiratory therapist at [facility]. Patient is aware that if this appointment date and time does not work for them they should contact Artis Delay directly at [phone]. Patient/Daughter is aware that a sleep packet will be sent from [facility] in week. Patient/Daughter is agreeable to treatment and thankful for call.

## 2018-11-17 ENCOUNTER — Other Ambulatory Visit: Payer: Self-pay | Admitting: Internal Medicine

## 2018-11-17 DIAGNOSIS — E876 Hypokalemia: Secondary | ICD-10-CM

## 2018-11-20 ENCOUNTER — Telehealth: Payer: Self-pay | Admitting: *Deleted

## 2018-11-20 DIAGNOSIS — G4730 Sleep apnea, unspecified: Secondary | ICD-10-CM

## 2018-11-20 NOTE — Telephone Encounter (Signed)
 -----   Message -----  From: Thompson Grayer, RN  Sent: 10/22/2018  3:26 PM EST  To: Thompson Grayer, RN, Cv Div Sleep Studies  Subject: sleep study                    Dr. Angelena Form has ordered a sleep study for this patient.

## 2018-11-20 NOTE — Telephone Encounter (Signed)
  Burnell Blanks, MD  Freada Bergeron, CMA  Cc: Thompson Grayer, RN        OK with me. Thanks.     ----- Message -----  From: Freada Bergeron, CMA  Sent: 11/11/2018  3:48 PM EST  To: Thompson Grayer, RN, *  Subject: FW: sleep study                  Approved a home sleep study, is this ok?

## 2018-11-20 NOTE — Telephone Encounter (Signed)
  Lauralee Evener, CMA  Freada Bergeron, [place] denied in lab sleep study. Recommend HST. HST does not need PA. Please notify ordering provider.

## 2018-11-26 ENCOUNTER — Encounter (HOSPITAL_BASED_OUTPATIENT_CLINIC_OR_DEPARTMENT_OTHER): Payer: Medicare HMO

## 2018-11-26 ENCOUNTER — Ambulatory Visit (HOSPITAL_BASED_OUTPATIENT_CLINIC_OR_DEPARTMENT_OTHER): Payer: Medicare HMO | Attending: Cardiology | Admitting: Cardiology

## 2018-11-26 DIAGNOSIS — R0902 Hypoxemia: Secondary | ICD-10-CM | POA: Insufficient documentation

## 2018-11-26 DIAGNOSIS — G4733 Obstructive sleep apnea (adult) (pediatric): Secondary | ICD-10-CM | POA: Insufficient documentation

## 2018-11-28 NOTE — Procedures (Signed)
   Patient Name: Tony Davis, Tony Davis Date: 11/26/2018 Gender: Male D.O.B: October 27, 1961 Age (years): 56 Referring Provider: Burnell Blanks Height (inches): 61 Interpreting Physician: Fransico Him MD, ABSM Weight (lbs): 249 RPSGT: Sandy, [place] BMI: 35 MRN: [id number] Neck Size: 19.00  CLINICAL INFORMATION Sleep Study Type: HST  Indication for sleep study: N/A  Epworth Sleepiness Score: 23  SLEEP STUDY TECHNIQUE A multi-channel overnight portable sleep study was performed. The channels recorded were: nasal airflow, thoracic respiratory movement, and oxygen saturation with a pulse oximetry. Snoring was also monitored.  MEDICATIONS Patient self administered medications include: N/A.  SLEEP ARCHITECTURE Patient was studied for 364.7 minutes. The sleep efficiency was 97.9 % and the patient was supine for 99.9%. The arousal index was 0.0 per hour.  RESPIRATORY PARAMETERS The overall AHI was 63.3 per hour, with a central apnea index of 0.0 per hour.  The oxygen nadir was 81% during sleep.  CARDIAC DATA Mean heart rate during sleep was 72.9 bpm.  IMPRESSIONS - Severe obstructive sleep apnea occurred during this study (AHI = 63.3/h). - No significant central sleep apnea occurred during this study (CAI = 0.0/h). - Moderate oxygen desaturation was noted during this study (Min O2 = 81%). - Patient snored 15.8% during the sleep.  DIAGNOSIS - Obstructive Sleep Apnea (327.23 [G47.33 ICD-10]) - Nocturnal Hypoxemia (327.26 [G47.36 ICD-10])  RECOMMENDATIONS - Recommend CPAP titration in lab. - Positional therapy avoiding supine position during sleep. - Avoid alcohol, sedatives and other CNS depressants that may worsen sleep apnea and disrupt normal sleep architecture. - Sleep hygiene should be reviewed to assess factors that may improve sleep quality. - Weight management and regular exercise should be initiated or continued.  [Electronically signed] 11/28/2018 10:11  PM  Fransico Him MD, ABSM Diplomate, American Board of Sleep Medicine

## 2018-12-01 ENCOUNTER — Other Ambulatory Visit (HOSPITAL_BASED_OUTPATIENT_CLINIC_OR_DEPARTMENT_OTHER): Payer: Self-pay

## 2018-12-01 DIAGNOSIS — G4730 Sleep apnea, unspecified: Secondary | ICD-10-CM

## 2018-12-02 NOTE — Telephone Encounter (Signed)
-----   Message from Sueanne Margarita, MD sent at 11/28/2018 10:13 PM EST ----- Please let patient know that they have sleep apnea and recommend CPAP titration. Please set up titration in the sleep lab.

## 2018-12-09 ENCOUNTER — Telehealth: Payer: Self-pay | Admitting: *Deleted

## 2018-12-09 NOTE — Telephone Encounter (Signed)
Informed wife/patient of sleep study results and wife/ patient understanding was verbalized. Wife/Patient understands his sleep study showed  they have sleep apnea and recommend CPAP titration. Please set up titration in the sleep lab.    Wife/Pt is aware and agreeable to results. cpap titration sent to precert

## 2018-12-09 NOTE — Telephone Encounter (Signed)
-----   Message from Sueanne Margarita, MD sent at 11/28/2018 10:13 PM EST ----- Please let patient know that they have sleep apnea and recommend CPAP titration. Please set up titration in the sleep lab.

## 2018-12-15 ENCOUNTER — Telehealth: Payer: Self-pay | Admitting: *Deleted

## 2018-12-15 ENCOUNTER — Other Ambulatory Visit: Payer: Self-pay | Admitting: Internal Medicine

## 2018-12-15 DIAGNOSIS — E785 Hyperlipidemia, unspecified: Secondary | ICD-10-CM

## 2018-12-15 NOTE — Telephone Encounter (Signed)
Opened in error

## 2018-12-16 ENCOUNTER — Telehealth: Payer: Self-pay | Admitting: *Deleted

## 2018-12-16 NOTE — Telephone Encounter (Signed)
-----   Message from Freada Bergeron, [place] sent at 12/09/2018  3:55 PM EST ----- Regarding: pre cert  recommend CPAP titration

## 2018-12-16 NOTE — Telephone Encounter (Signed)
CPAP titration PA submitted to [employer] via web portal. 

## 2018-12-18 ENCOUNTER — Telehealth: Payer: Self-pay | Admitting: *Deleted

## 2018-12-18 NOTE — Telephone Encounter (Signed)
-----   Message from Freada Bergeron, [place] sent at 12/09/2018  3:55 PM EST ----- Regarding: pre cert  recommend CPAP titration

## 2018-12-18 NOTE — Telephone Encounter (Signed)
Staff message sent to [place] received. Ok to schedule CPAP titration study. Auth # [id number]. Valid 12/17/18 to 06/15/19.

## 2018-12-23 ENCOUNTER — Telehealth: Payer: Self-pay | Admitting: *Deleted

## 2018-12-23 NOTE — Telephone Encounter (Addendum)
Patient is scheduled for CPAP Titration on 01/31/19. Patient/Wife understands his titration study will be done at [facility] sleep lab. Patient/Wife understands he will receive a letter in a week or so detailing appointment, date, time, and location. Patient/Wife understands to call if he does not receive the letter  in a timely manner. Patient/Wife agrees with treatment and thanked me for call.

## 2018-12-23 NOTE — Telephone Encounter (Signed)
-----   Message from Lauralee Evener, [place] sent at 12/18/2018  8:44 AM EST ----- Regarding: RE: pre cert [employer] auth received.  Ok to schedule CPAP titration. Auth # [id number]. Valid dates 12/17/18 to 06/15/19. ----- Message ----- From: Freada Bergeron, CMA Sent: 12/09/2018   3:55 PM EST To: Cv Div Sleep Studies Subject: pre cert                                        recommend CPAP titration

## 2018-12-25 ENCOUNTER — Encounter: Payer: Self-pay | Admitting: *Deleted

## 2019-01-01 ENCOUNTER — Encounter (HOSPITAL_BASED_OUTPATIENT_CLINIC_OR_DEPARTMENT_OTHER): Payer: Medicare HMO

## 2019-01-25 ENCOUNTER — Encounter (HOSPITAL_BASED_OUTPATIENT_CLINIC_OR_DEPARTMENT_OTHER): Payer: Medicare HMO

## 2019-01-31 ENCOUNTER — Ambulatory Visit (HOSPITAL_BASED_OUTPATIENT_CLINIC_OR_DEPARTMENT_OTHER): Payer: Medicare HMO | Attending: Cardiology | Admitting: Cardiology

## 2019-01-31 DIAGNOSIS — G4730 Sleep apnea, unspecified: Secondary | ICD-10-CM | POA: Diagnosis not present

## 2019-01-31 DIAGNOSIS — G4733 Obstructive sleep apnea (adult) (pediatric): Secondary | ICD-10-CM | POA: Insufficient documentation

## 2019-02-02 NOTE — Procedures (Signed)
   Patient Name: Tony Davis, Tony Davis Date: 01/31/2019 Gender: Male D.O.B: [birth year] Age (years): 57 Referring Provider: Burnell Blanks Height (inches): 71 Interpreting Physician: Fransico Him MD, ABSM Weight (lbs): 248 RPSGT: [place] BMI: 35 MRN: [id number] Neck Size: 20.00  CLINICAL INFORMATION  The patient is referred for a CPAP titration to treat sleep apnea.  SLEEP STUDY TECHNIQUE  As per the AASM Manual for the Scoring of Sleep and Associated Events v2.3 (April 2016) with a hypopnea requiring 4% desaturations. The channels recorded and monitored were frontal, central and occipital EEG, electrooculogram (EOG), submentalis EMG (chin), nasal and oral airflow, thoracic and abdominal wall motion, anterior tibialis EMG, snore microphone, electrocardiogram, and pulse oximetry. Continuous positive airway pressure (CPAP) was initiated at the beginning of the study and titrated to treat sleep-disordered breathing.  MEDICATIONS  Medications self-administered by patient taken the night of the study : N/A  TECHNICIAN COMMENTS  Comments added by technician: NONE Comments added by scorer: N/A  RESPIRATORY PARAMETERS  Optimal PAP Pressure (cm):18 AHI at Optimal Pressure (/hr):0.6 Overall Minimal O2 (%):88.0 Supine % at Optimal Pressure (%):70 Minimal O2 at Optimal Pressure (%):92.0  SLEEP ARCHITECTURE  The study was initiated at 9:30:33 PM and ended at 4:27:38 AM. Sleep onset time was 3.4 minutes and the sleep efficiency was 97.4%%. The total sleep time was 406.2 minutes. The patient spent 0.9% of the night in stage N1 sleep, 68.2% in stage N2 sleep, 0.0% in stage N3 and 30.9% in REM.Stage REM latency was 35.0 minutes Wake after sleep onset was 7.5. Alpha intrusion was absent. Supine sleep was 78.33%.  CARDIAC DATA  The 2 lead EKG demonstrated sinus rhythm. The mean heart rate was 62.0 beats per minute. Other EKG findings include: None.  LEG MOVEMENT DATA  The  total Periodic Limb Movements of Sleep (PLMS) were 0. The PLMS index was 0.0. A PLMS index of <15 is considered normal in adults.  IMPRESSIONS  - The optimal PAP pressure was 18 cm of water. - Central sleep apnea was not noted during this titration (CAI = 0.1/h). - Mild oxygen desaturations were observed during this titration (min O2 = 88.0%). - The patient snored with moderate snoring volume during this titration study. - No cardiac abnormalities were observed during this study. - Clinically significant periodic limb movements were not noted during this study. Arousals associated with PLMs were rare.  DIAGNOSIS  - Obstructive Sleep Apnea (327.23 [G47.33 ICD-10])  RECOMMENDATIONS  - Trial of CPAP therapy on 18 cm H2O with a Large size Fisher&Paykel Full Face Mask Simplus mask and heated humidification. - Avoid alcohol, sedatives and other CNS depressants that may worsen sleep apnea and disrupt normal sleep architecture. - Sleep hygiene should be reviewed to assess factors that may improve sleep quality. - Weight management and regular exercise should be initiated or continued. - Return to Sleep Center for re-evaluation after 10 weeks of therapy  [Electronically signed] 02/02/2019 10:18 AM  Fransico Him MD, ABSM Diplomate, American Board of Sleep Medicine

## 2019-02-04 ENCOUNTER — Telehealth: Payer: Self-pay | Admitting: *Deleted

## 2019-02-04 NOTE — Telephone Encounter (Signed)
Informed patient of titration results and verbalized understanding was indicated. Wife/Patient understands titration study showed they had a successful PAP titration and orders are in EPIC. Please set up 10 week OV with me.    Upon patient request DME selection is [facility]. Wife/Patient understands he will be contacted by [facility] to set up hiscpap. Patient understands to call if [facility] does not contact him with new setup in a timely manner. Patient understands they will be called once confirmation has been received from [facility] that they have received their new machine to schedule 10 week follow up appointment.  [facility] notified of new cpap order  Please add to airview Patient was grateful for the call and thanked me.

## 2019-02-04 NOTE — Telephone Encounter (Signed)
-----   Message from Sueanne Margarita, MD sent at 02/02/2019 10:21 AM EST ----- Please let patient know that they had a successful PAP titration and let DME know that orders are in EPIC.  Please set up 10 week OV with me.

## 2019-02-11 NOTE — Telephone Encounter (Signed)
Patient has a 10 week follow up appointment scheduled for 05/05/19 at 3:40. Patient understands he needs to keep this appointment for insurance compliance. Patient was grateful for the call and thanked me.

## 2019-04-20 ENCOUNTER — Telehealth: Payer: Self-pay | Admitting: Cardiology

## 2019-04-20 NOTE — Telephone Encounter (Signed)
Virtual Visit Pre-Appointment Phone Call  "(Name), I am calling you today to discuss your upcoming appointment. We are currently trying to limit exposure to the virus that causes COVID-19 by seeing patients at home rather than in the office."  1. "What is the BEST phone number to call the day of the visit?" - include this in appointment notes  2. Do you have or have access to (through a family member/friend) a smartphone with video capability that we can use for your visit?" a. If yes - list this number in appt notes as cell (if different from BEST phone #) and list the appointment type as a VIDEO visit in appointment notes b. If no - list the appointment type as a PHONE visit in appointment notes  3. Confirm consent - "In the setting of the current Covid19 crisis, you are scheduled for a (phone or video) visit with your provider on (date) at (time).  Just as we do with many in-office visits, in order for you to participate in this visit, we must obtain consent.  If you'd like, I can send this to your mychart (if signed up) or email for you to review.  Otherwise, I can obtain your verbal consent now.  All virtual visits are billed to your insurance company just like a normal visit would be.  By agreeing to a virtual visit, we'd like you to understand that the technology does not allow for your provider to perform an examination, and thus may limit your provider's ability to fully assess your condition. If your provider identifies any concerns that need to be evaluated in person, we will make arrangements to do so.  Finally, though the technology is pretty good, we cannot assure that it will always work on either your or our end, and in the setting of a video visit, we may have to convert it to a phone-only visit.  In either situation, we cannot ensure that we have a secure connection.  Are you willing to proceed?" STAFF: Did the patient verbally acknowledge consent to telehealth visit? Document  YES/NO here: Yes/Video/[email or web address]/smartphone [phone]/verbal consent 04/20/19  4. Advise patient to be prepared - "Two hours prior to your appointment, go ahead and check your blood pressure, pulse, oxygen saturation, and your weight (if you have the equipment to check those) and write them all down. When your visit starts, your provider will ask you for this information. If you have an Apple Watch or Kardia device, please plan to have heart rate information ready on the day of your appointment. Please have a pen and paper handy nearby the day of the visit as well."  5. Give patient instructions for MyChart download to smartphone OR [email or web address] as below if video visit (depending on what platform provider is using)  6. Inform patient they will receive a phone call 15 minutes prior to their appointment time (may be from unknown caller ID) so they should be prepared to answer    TELEPHONE CALL NOTE  [name] has been deemed a candidate for a follow-up tele-health visit to limit community exposure during the Covid-19 pandemic. I spoke with the patient via phone to ensure availability of phone/video source, confirm preferred email & phone number, and discuss instructions and expectations.  I reminded [name] to be prepared with any vital sign and/or heart rhythm information that could potentially be obtained via home monitoring, at the time of his visit. I reminded [name]  to expect a phone call prior to his visit.  Armando Gang 04/20/2019 9:53 AM   INSTRUCTIONS FOR DOWNLOADING THE MYCHART APP TO SMARTPHONE  - The patient must first make sure to have activated MyChart and know their login information - If Apple, go to [employer] and type in MyChart in the search bar and download the app. If Android, ask patient to go to [employer] and type in [place] in the search bar and download the app. The app is free but as with any other app downloads, their  phone may require them to verify saved payment information or Apple/Android password.  - The patient will need to then log into the app with their MyChart username and password, and select  as their healthcare provider to link the account. When it is time for your visit, go to the MyChart app, find appointments, and click Begin Video Visit. Be sure to Select Allow for your device to access the Microphone and Camera for your visit. You will then be connected, and your provider will be with you shortly.  **If they have any issues connecting, or need assistance please contact MyChart service desk [phone])**  **If using a computer, in order to ensure the best quality for their visit they will need to use either of the following Internet Browsers: [employer], or Google Chrome**  IF USING DOXIMITY or DOXY.ME - The patient will receive a link just prior to their visit by text.     FULL LENGTH CONSENT FOR TELE-HEALTH VISIT   I hereby voluntarily request, consent and authorize [place] and its employed or contracted physicians, physician assistants, nurse practitioners or other licensed health care professionals (the Practitioner), to provide me with telemedicine health care services (the Services") as deemed necessary by the treating Practitioner. I acknowledge and consent to receive the Services by the Practitioner via telemedicine. I understand that the telemedicine visit will involve communicating with the Practitioner through live audiovisual communication technology and the disclosure of certain medical information by electronic transmission. I acknowledge that I have been given the opportunity to request an in-person assessment or other available alternative prior to the telemedicine visit and am voluntarily participating in the telemedicine visit.  I understand that I have the right to withhold or withdraw my consent to the use of telemedicine in the course of  my care at any time, without affecting my right to future care or treatment, and that the Practitioner or I may terminate the telemedicine visit at any time. I understand that I have the right to inspect all information obtained and/or recorded in the course of the telemedicine visit and may receive copies of available information for a reasonable fee.  I understand that some of the potential risks of receiving the Services via telemedicine include:   Delay or interruption in medical evaluation due to technological equipment failure or disruption;  Information transmitted may not be sufficient (e.g. poor resolution of images) to allow for appropriate medical decision making by the Practitioner; and/or   In rare instances, security protocols could fail, causing a breach of personal health information.  Furthermore, I acknowledge that it is my responsibility to provide information about my medical history, conditions and care that is complete and accurate to the best of my ability. I acknowledge that Practitioner's advice, recommendations, and/or decision may be based on factors not within their control, such as incomplete or inaccurate data provided by me or distortions of diagnostic images or specimens  that may result from electronic transmissions. I understand that the practice of medicine is not an exact science and that Practitioner makes no warranties or guarantees regarding treatment outcomes. I acknowledge that I will receive a copy of this consent concurrently upon execution via email to the email address I last provided but may also request a printed copy by calling the office of [facility].    I understand that my insurance will be billed for this visit.   I have read or had this consent read to me.  I understand the contents of this consent, which adequately explains the benefits and risks of the Services being provided via telemedicine.   I have been provided ample opportunity to ask  questions regarding this consent and the Services and have had my questions answered to my satisfaction.  I give my informed consent for the services to be provided through the use of telemedicine in my medical care  By participating in this telemedicine visit I agree to the above.

## 2019-04-21 DIAGNOSIS — E669 Obesity, unspecified: Secondary | ICD-10-CM | POA: Insufficient documentation

## 2019-04-21 NOTE — Progress Notes (Signed)
Virtual Visit via Video Note   This visit type was conducted due to national recommendations for restrictions regarding the COVID-19 Pandemic (e.g. social distancing) in an effort to limit this patient's exposure and mitigate transmission in our community.  Due to his co-morbid illnesses, this patient is at least at moderate risk for complications without adequate follow up.  This format is felt to be most appropriate for this patient at this time.  All issues noted in this document were discussed and addressed.  A limited physical exam was performed with this format.  Please refer to the patient's chart for his consent to telehealth for [facility].    Evaluation Performed:  Follow-up visit  This visit type was conducted due to national recommendations for restrictions regarding the COVID-19 Pandemic (e.g. social distancing).  This format is felt to be most appropriate for this patient at this time.  All issues noted in this document were discussed and addressed.  No physical exam was performed (except for noted visual exam findings with Video Visits).  Please refer to the patient's chart (MyChart message for video visits and phone note for telephone visits) for the patient's consent to telehealth for [facility].  Date:  04/22/2019   ID:  Tony Davis., DOB 1961-05-16, MRN [id number]  Patient Location:  Home  Provider location:   [place]  PCP:  Aletha Halim., PA-C  Cardiologist:  Lauree Chandler, MD  Sleep Medicine:  Fransico Him, MD Electrophysiologist:  None   Chief Complaint:  OSA  History of Present Illness:    Tony Davis. is a 58 y.o. male who presents via audio/video conferencing for a telehealth visit today.    This is a 58yo male with a history of ASCAD, CVA, HTN and hyperlipidemia who was referred for sleep study due to excessive daytime sleepiness with an Epworth sleepiness score of 23.  He underwent PSG showing severe OSA with an AHI of  63/hr and oxygen desaturations of 81%.  He underwent CPAP titration to 18cm H2O.  He is doing well with his CPAP device and thinks that he has gotten used to it.  He tolerates the mask and feels the pressure is adequate.  Since going on CPAP he feels rested in the am and has no significant daytime sleepiness.  He denies any significant mouth or nasal dryness or nasal congestion.  He does not think that he snores.   The patient does not have symptoms concerning for COVID-19 infection (fever, chills, cough, or new shortness of breath).   Prior CV studies:   The following studies were reviewed today:  PAP compliance download  Past Medical History:  Diagnosis Date  . Acute respiratory failure ([place]) 2015   a. Following BG hemorrhage - s/p trache with decannulation.  . Arthritis    "knees" (09/25/2013)  . Basal ganglia hemorrhage ([place]) 2015   a. 2015 - with intraventricular extension and hydrocephalus requiring right crani and VP shunt, complicated by encephalopathy, protein calorie malnutrition, acute resp failure req trache and PEG, BLE DVT s/p IVC, enterobacter PNA, subsequent right hemiparesis and difficulty communicating.  Marland Kitchen BPH (benign prostatic hyperplasia)   . CAD (coronary artery disease)    a. s/p promus DES circumflex artery 10/13/10. b. Cath 04/2013: stable disease but possible small vessel disease -Imdur added.  . Carotid artery disease ([place])    a. 1-29% BICA by study 04/2014.  Marland Kitchen CVA (cerebral vascular accident) ([place]) 02/2010   a. CVA 2011, prior reported TIAs.  b. CNS hemorrhage 2015.  Marland Kitchen CVA (cerebral vascular accident) ([place]) 06/2014   "hemorrhagic; in [place]; brain activity diminshed; ability to do anything; can't do ADLs on own"  . DVT (deep venous thrombosis) ([place]) 2013   a. 2013. b. 2015 - reported BLE DVT s/p IVC filter.   . Enterobacter cloacae pneumonia ([place]) 2015  . [place]   . Folate deficiency anemia   . GERD (gastroesophageal reflux disease)   . Hematuria 8/12  . HLD  (hyperlipidemia)   . HTN (hypertension)   . Hydrocephalus ([place]) 2015   a. 2/2 BG hemorrhage.  . Incontinence of feces   . Lower extremity edema   . LV dysfunction    a. 2011 - EF 45-50%, subsequent 55-60% in 04/2014.  Marland Kitchen Noncompliance   . Orthostatic hypotension   . OSA on CPAP    "not using CPAP right now" (11/30/2014)  . PFO (patent foramen ovale)    a. echo 8/12: EF 55-65%, grade 2 diast dysfnx; +PFO on bubble study. b. TEE 4/13: EF normal, atrial septum with suspicion for interatrial septum fenestrations without flow across and few large bubbles noted in LA.  This was not felt to require closure   . Protein calorie malnutrition ([place])   . Rheumatoid arthritis(714.0)   . S/P percutaneous endoscopic gastrostomy (PEG) tube placement ([place]) 2015  . TIA (transient ischemic attack)    "1-2; both after the one in 02/2010" (09/25/2013)  . Type II diabetes mellitus ([place])   . Urinary incontinence    Past Surgical History:  Procedure Laterality Date  . CORONARY ANGIOPLASTY WITH STENT PLACEMENT  2012   "1" (09/24/2013)  . KNEE ARTHROSCOPY Bilateral 2000's   2 on left and 3 on rt  . LEFT HEART CATHETERIZATION WITH CORONARY ANGIOGRAM N/A 04/24/2013   Procedure: LEFT HEART CATHETERIZATION WITH CORONARY ANGIOGRAM;  Surgeon: Burnell Blanks, MD;  Location: [facility] CATH LAB;  Service: Cardiovascular;  Laterality: N/A;  . TEE WITHOUT CARDIOVERSION  03/21/2012   Procedure: TRANSESOPHAGEAL ECHOCARDIOGRAM (TEE);  Surgeon: Fay Records, MD;  Location[facility] ENDOSCOPY;  Service: Cardiovascular;  Laterality: N/A;     Current Meds  Medication Sig  . aspirin EC 81 MG tablet Take 81 mg by mouth daily.  Marland Kitchen atorvastatin (LIPITOR) 80 MG tablet TAKE ONE TABLET BY MOUTH DAILY  . donepezil (ARICEPT) 10 MG tablet Take 10 mg by mouth at bedtime.  Marland Kitchen escitalopram (LEXAPRO) 20 MG tablet Take 20 mg by mouth daily.  . hydrALAZINE (APRESOLINE) 50 MG tablet Take 50 mg by mouth as directed. Pt takes 2 tablets 3 times a day   . lisinopril (PRINIVIL,ZESTRIL) 40 MG tablet Take 40 mg by mouth daily.   . metoprolol succinate (TOPROL-XL) 100 MG 24 hr tablet Take 1 tablet (100 mg total) by mouth 2 (two) times daily.  . pantoprazole (PROTONIX) 40 MG tablet Take 1 tablet by mouth daily.  . potassium chloride (K-DUR) 10 MEQ tablet TAKE ONE TABLET BY MOUTH DAILY     Allergies:   Iodine; Amlodipine; and Donepezil   Social History   Tobacco Use  . Smoking status: Former Smoker    Packs/day: 1.00    Years: 35.00    Pack years: 35.00    Types: Cigarettes    Last attempt to quit: 03/10/2012    Years since quitting: 7.1  . Smokeless tobacco: Never Used  Substance Use Topics  . Alcohol use: No    Alcohol/week: 0.0 standard drinks  . Drug use: No  Family Hx: The patient's family history includes Heart attack in his father and sister; Hypertension in his brother; Seizures in his sister; Stroke in his father and sister. There is no history of Cancer, Kidney disease, or Diabetes.  ROS:   Please see the history of present illness.     All other systems reviewed and are negative.   Labs/Other Tests and Data Reviewed:    Recent Labs: 09/21/2018: ALT 19; BUN 13; Creatinine, Ser 1.07; Hemoglobin 13.4; Platelets 281; Potassium 3.4; Sodium 140; TSH 0.684   Recent Lipid Panel Lab Results  Component Value Date/Time   CHOL 225 (H) 07/09/2016 04:54 PM   TRIG 343.0 (H) 07/09/2016 04:54 PM   HDL 37.50 (L) 07/09/2016 04:54 PM   CHOLHDL 6 07/09/2016 04:54 PM   LDLCALC 64 02/11/2014 02:22 PM   LDLDIRECT 145.0 07/09/2016 04:54 PM    Wt Readings from Last 3 Encounters:  01/31/19 248 lb (112.5 kg)  10/22/18 256 lb 12.8 oz (116.5 kg)  09/21/18 253 lb 15.5 oz (115.2 kg)     Objective:    Vital Signs:  BP (!) 155/93 (BP Location: Left Arm, Patient Position: Sitting, Cuff Size: Normal)   Pulse 60   Ht 5\' 11"  (1.803 m)   BMI 34.59 kg/m    CONSTITUTIONAL:  Well nourished, well developed male in no acute distress.   EYES: anicteric MOUTH: oral mucosa is pink RESPIRATORY: Normal respiratory effort, symmetric expansion CARDIOVASCULAR: No peripheral edema SKIN: No rash, lesions or ulcers MUSCULOSKELETAL: no digital cyanosis NEURO: Cranial Nerves II-XII grossly intact, moves all extremities PSYCH: Intact judgement and insight.  A&O x 3, Mood/affect appropriate   ASSESSMENT & PLAN:    1.  OSA - the patient is tolerating PAP therapy well without any problems. The PAP download was reviewed today and showed an AHI of 3.6/hr on 18 cm H2O with 37% compliance in using more than 4 hours nightly.  The patient has been using and benefiting from PAP use and will continue to benefit from therapy. I encouraged the patient to be more compliant with his device.   2.  Hypertension -  His BP is borderline controlled on exam today.  He will continue on Hydralazine 100mg  TID, Toprol XL 100mg  BID and Lisinopril 40mg  daily.  He has not taken his BP meds yet today. His PCP is managing his BP and it was 135/85mmHg at the PCP last week.  3.  Obesity - I have encouraged him to get into a routine exercise program and cut back on carbs and portions.   4.  COVID-19 Education:The signs and symptoms of COVID-19 were discussed with the patient and how to seek care for testing (follow up with PCP or arrange E-visit).  The importance of social distancing was discussed today.  Patient Risk:   After full review of this patient's clinical status, I feel that they are at least moderate risk at this time.  Time:   Today, I have spent 15 minutes with patient with telehealth technology.   Medication Adjustments/Labs and Tests Ordered: Current medicines are reviewed at length with the patient today.  Concerns regarding medicines are outlined above.  Tests Ordered: No orders of the defined types were placed in this encounter.  Medication Changes: No orders of the defined types were placed in this encounter.   Disposition:  Follow up in 1  year(s)  Signed, Fransico Him, MD  04/22/2019 9:25 AM    [facility]

## 2019-04-22 ENCOUNTER — Telehealth (INDEPENDENT_AMBULATORY_CARE_PROVIDER_SITE_OTHER): Payer: Medicare HMO | Admitting: Cardiology

## 2019-04-22 ENCOUNTER — Encounter: Payer: Self-pay | Admitting: Cardiology

## 2019-04-22 ENCOUNTER — Other Ambulatory Visit: Payer: Self-pay

## 2019-04-22 VITALS — BP 155/93 | HR 60 | Ht 71.0 in

## 2019-04-22 DIAGNOSIS — G4733 Obstructive sleep apnea (adult) (pediatric): Secondary | ICD-10-CM

## 2019-04-22 DIAGNOSIS — I10 Essential (primary) hypertension: Secondary | ICD-10-CM | POA: Diagnosis not present

## 2019-04-22 DIAGNOSIS — E669 Obesity, unspecified: Secondary | ICD-10-CM

## 2019-04-22 NOTE — Patient Instructions (Signed)
Medication Instructions:  Your provider recommends that you continue on your current medications as directed. Please refer to the Current Medication list given to you today.    Labwork: None  Testing/Procedures: None  Follow-Up: Your provider wants you to follow-up in: 1 year with Dr. Turner. You will receive a reminder letter in the mail two months in advance. If you don't receive a letter, please call our office to schedule the follow-up appointment.   

## 2019-05-05 ENCOUNTER — Ambulatory Visit: Payer: Medicare HMO | Admitting: Cardiology

## 2019-07-08 ENCOUNTER — Other Ambulatory Visit: Payer: Self-pay | Admitting: Cardiovascular Disease

## 2019-07-08 DIAGNOSIS — I2510 Atherosclerotic heart disease of native coronary artery without angina pectoris: Secondary | ICD-10-CM

## 2019-07-08 DIAGNOSIS — I10 Essential (primary) hypertension: Secondary | ICD-10-CM

## 2019-07-08 DIAGNOSIS — I5032 Chronic diastolic (congestive) heart failure: Secondary | ICD-10-CM

## 2020-03-30 ENCOUNTER — Other Ambulatory Visit: Payer: Self-pay | Admitting: Cardiovascular Disease

## 2020-03-30 DIAGNOSIS — I10 Essential (primary) hypertension: Secondary | ICD-10-CM

## 2020-03-30 DIAGNOSIS — I2510 Atherosclerotic heart disease of native coronary artery without angina pectoris: Secondary | ICD-10-CM

## 2020-03-30 DIAGNOSIS — I5032 Chronic diastolic (congestive) heart failure: Secondary | ICD-10-CM

## 2020-05-02 ENCOUNTER — Other Ambulatory Visit: Payer: Self-pay | Admitting: Cardiovascular Disease

## 2020-05-02 DIAGNOSIS — I2510 Atherosclerotic heart disease of native coronary artery without angina pectoris: Secondary | ICD-10-CM

## 2020-05-02 DIAGNOSIS — I5032 Chronic diastolic (congestive) heart failure: Secondary | ICD-10-CM

## 2020-05-02 DIAGNOSIS — I10 Essential (primary) hypertension: Secondary | ICD-10-CM

## 2020-05-30 ENCOUNTER — Other Ambulatory Visit: Payer: Self-pay | Admitting: Cardiovascular Disease

## 2020-06-06 ENCOUNTER — Other Ambulatory Visit: Payer: Self-pay | Admitting: Cardiovascular Disease

## 2020-06-06 DIAGNOSIS — I10 Essential (primary) hypertension: Secondary | ICD-10-CM

## 2020-06-06 DIAGNOSIS — I2510 Atherosclerotic heart disease of native coronary artery without angina pectoris: Secondary | ICD-10-CM

## 2020-06-06 DIAGNOSIS — I5032 Chronic diastolic (congestive) heart failure: Secondary | ICD-10-CM

## 2020-06-13 ENCOUNTER — Other Ambulatory Visit: Payer: Self-pay | Admitting: Cardiovascular Disease

## 2020-06-13 DIAGNOSIS — I5032 Chronic diastolic (congestive) heart failure: Secondary | ICD-10-CM

## 2020-06-13 DIAGNOSIS — I10 Essential (primary) hypertension: Secondary | ICD-10-CM

## 2020-06-13 DIAGNOSIS — I2510 Atherosclerotic heart disease of native coronary artery without angina pectoris: Secondary | ICD-10-CM

## 2020-07-13 NOTE — Progress Notes (Signed)
Cardiology Office Note    Date:  07/19/2020   ID:  Tony Banker., DOB [birth year], MRN [id number]  PCP:  Tony Halim., PA-C  Cardiologist: Lauree Chandler, MD EPS: None  No chief complaint on file.   History of Present Illness:  Tony Davis. is a 59 y.o. male with history of CAD status post DES to the circumflex 2011, cath 04/24/2013 stable CAD with patent stent in the circumflex mild disease in the LAD moderate disease in the RCA.  Echo showed septal and inferior basal hypokinesis with mild concentric hypertrophy EF 45 to 50%.  Repeat echo 07/2011 showed a PFO with positive bubble study.  He skipped his appointment with Dr. Burt Knack for PFO closure.  Admitted to hospital in [place] in 2013 with acute onset left arm and leg weakness.  CT without acute stroke, venous Dopplers with chronic DVT of the right femoral vein, carotids less than 50% R ICA stenosis, LICA stable.  Echo 03/14/2012 in [place] normal LV size and function with PFO with positive bubble study.  He was started on Coumadin and Lovenox and left AMA.  Recurrent left side weakness 03/2012 admitted for heparin and Coumadin.  TEE showed normal LVEF.  Atrial septum had intra-atrial septum fenestrations without flow across and few large bubbles noted in the LA.  This was not felt to require closure.  He was maintained on Coumadin.  He suffered a hemorrhagic stroke 06/2014 and Coumadin was stopped.  Was complicated by intraventricular extension and hydrocephalus requiring VP shunt, encephalopathy, respiratory failure requiring tracheostomy and PEG.  Bilateral lower extremity DVT status post IVC filter.  Patient also has OSA on CPAP followed by Dr. Radford Pax, hypertension, HLD.  Patient comes in accompanied by his wife. He complains of chest pain like  knife at night associated with shortness of breath. Says it comes and goes. His wife says he's never complained of it and isn't reliable. Sleeps most of the day, no exercise.  Doesn't go out anywhere.PCP just checked his labs and he's borderline diabetic. Not using CPAP-refuses to use it and wife can't keep it on him.   Past Medical History:  Diagnosis Date  . Acute respiratory failure ([place]) 2015   a. Following BG hemorrhage - s/p trache with decannulation.  . Arthritis    "knees" (09/25/2013)  . Basal ganglia hemorrhage ([place]) 2015   a. 2015 - with intraventricular extension and hydrocephalus requiring right crani and VP shunt, complicated by encephalopathy, protein calorie malnutrition, acute resp failure req trache and PEG, BLE DVT s/p IVC, enterobacter PNA, subsequent right hemiparesis and difficulty communicating.  Marland Kitchen BPH (benign prostatic hyperplasia)   . CAD (coronary artery disease)    a. s/p promus DES circumflex artery 10/13/10. b. Cath 04/2013: stable disease but possible small vessel disease -Imdur added.  . Carotid artery disease ([place])    a. 1-29% BICA by study 04/2014.  Marland Kitchen CVA (cerebral vascular accident) ([place]) 02/2010   a. CVA 2011, prior reported TIAs. b. CNS hemorrhage 2015.  Marland Kitchen CVA (cerebral vascular accident) ([place]) 06/2014   "hemorrhagic; in [place]; brain activity diminshed; ability to do anything; can't do ADLs on own"  . DVT (deep venous thrombosis) ([place]) 2013   a. 2013. b. 2015 - reported BLE DVT s/p IVC filter.   . Enterobacter cloacae pneumonia ([place]) 2015  . [place]   . Folate deficiency anemia   . GERD (gastroesophageal reflux disease)   . Hematuria 8/12  . HLD (hyperlipidemia)   . HTN (  hypertension)   . Hydrocephalus ([place]) 2015   a. 2/2 BG hemorrhage.  . Incontinence of feces   . Lower extremity edema   . LV dysfunction    a. 2011 - EF 45-50%, subsequent 55-60% in 04/2014.  Marland Kitchen Noncompliance   . Orthostatic hypotension   . OSA on CPAP    "not using CPAP right now" (11/30/2014)  . PFO (patent foramen ovale)    a. echo 8/12: EF 55-65%, grade 2 diast dysfnx; +PFO on bubble study. b. TEE 4/13: EF normal, atrial septum with suspicion for  interatrial septum fenestrations without flow across and few large bubbles noted in LA.  This was not felt to require closure   . Protein calorie malnutrition ([place])   . Rheumatoid arthritis(714.0)   . S/P percutaneous endoscopic gastrostomy (PEG) tube placement ([place]) 2015  . TIA (transient ischemic attack)    "1-2; both after the one in 02/2010" (09/25/2013)  . Type II diabetes mellitus ([place])   . Urinary incontinence     Past Surgical History:  Procedure Laterality Date  . CORONARY ANGIOPLASTY WITH STENT PLACEMENT  2012   "1" (09/24/2013)  . KNEE ARTHROSCOPY Bilateral 2000's   2 on left and 3 on rt  . LEFT HEART CATHETERIZATION WITH CORONARY ANGIOGRAM N/A 04/24/2013   Procedure: LEFT HEART CATHETERIZATION WITH CORONARY ANGIOGRAM;  Surgeon: Burnell Blanks, MD;  Location: [facility] CATH LAB;  Service: Cardiovascular;  Laterality: N/A;  . TEE WITHOUT CARDIOVERSION  03/21/2012   Procedure: TRANSESOPHAGEAL ECHOCARDIOGRAM (TEE);  Surgeon: Fay Records, MD;  Location: [facility] ENDOSCOPY;  Service: Cardiovascular;  Laterality: N/A;    Current Medications: Current Meds  Medication Sig  . aspirin EC 81 MG tablet Take 81 mg by mouth daily.  Marland Kitchen atorvastatin (LIPITOR) 80 MG tablet TAKE ONE TABLET BY MOUTH DAILY  . donepezil (ARICEPT) 10 MG tablet Take 10 mg by mouth at bedtime.  Marland Kitchen escitalopram (LEXAPRO) 20 MG tablet Take 20 mg by mouth daily.  . hydrALAZINE (APRESOLINE) 50 MG tablet Take 2 tablets (100 mg total) by mouth in the morning and at bedtime.  Marland Kitchen lisinopril (PRINIVIL,ZESTRIL) 40 MG tablet Take 40 mg by mouth daily.   . metoprolol succinate (TOPROL-XL) 100 MG 24 hr tablet Take 1 tablet (100 mg total) by mouth 2 (two) times daily.  . pantoprazole (PROTONIX) 40 MG tablet Take 1 tablet by mouth daily.  . potassium chloride (K-DUR) 10 MEQ tablet TAKE ONE TABLET BY MOUTH DAILY  . [DISCONTINUED] hydrALAZINE (APRESOLINE) 50 MG tablet TAKE ONE AND ONE-HALF (1.5) TABLETS BY MOUTH THREE TIMES A DAY. Please  keep upcoming appt in August before anymore refills. Final Attempt (Patient taking differently: Take 100 mg by mouth in the morning and at bedtime. )  . [DISCONTINUED] metoprolol succinate (TOPROL-XL) 100 MG 24 hr tablet Take 1 tablet (100 mg total) by mouth 2 (two) times daily. Please keep upcoming appt in August before anymore refills. Final Attempt     Allergies:   Iodine, Amlodipine, and Donepezil   Social History   Socioeconomic History  . Marital status: Married    Spouse name: Not on file  . Number of children: Not on file  . Years of education: Not on file  . Highest education level: Not on file  Occupational History  . Not on file  Tobacco Use  . Smoking status: Former Smoker    Packs/day: 1.00    Years: 35.00    Pack years: 35.00    Types: Cigarettes  Quit date: 03/10/2012    Years since quitting: 8.3  . Smokeless tobacco: Never Used  Vaping Use  . Vaping Use: Never used  Substance and Sexual Activity  . Alcohol use: No    Alcohol/week: 0.0 standard drinks  . Drug use: No  . Sexual activity: Not Currently    Partners: Female  Other Topics Concern  . Not on file  Social History Narrative   Married, 5 kids; lives in [place]. 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    Social Determinants of Health   Financial Resource Strain:   . Difficulty of Paying Living Expenses:   Food Insecurity:   . Worried About Charity fundraiser in the Last Year:   . Arboriculturist in the Last Year:   Transportation Needs:   . Film/video editor (Medical):   Marland Kitchen Lack of Transportation (Non-Medical):   Physical Activity:   . Days of Exercise per Week:   . Minutes of Exercise per Session:   Stress:   . Feeling of Stress :   Social Connections:   . Frequency of Communication with Friends and Family:   . Frequency of Social Gatherings with Friends and Family:   . Attends Religious Services:   . Active Member of Clubs or Organizations:   . Attends Theatre manager Meetings:   Marland Kitchen Marital Status:      Family History:  The patient's family history includes Heart attack in his father and sister; Hypertension in his brother; Seizures in his sister; Stroke in his father and sister.   ROS:   Please see the history of present illness.    ROS All other systems reviewed and are negative.   PHYSICAL EXAM:   VS:  BP 130/80   Pulse 65   Ht 5\' 11"  (1.803 m)   Wt 241 lb 3.2 oz (109.4 kg)   SpO2 99%   BMI 33.64 kg/m   Physical Exam  GEN: Obese, in no acute distress  Neck: no JVD, carotid bruits, or masses Cardiac:RRR; no murmurs, rubs, or gallops  Respiratory:  clear to auscultation bilaterally, normal work of breathing GI: soft, nontender, nondistended, + BS Ext: without cyanosis, clubbing, or edema, Good distal pulses bilaterally Neuro:  Alert and Oriented x 3 Psych: euthymic mood, full affect  Wt Readings from Last 3 Encounters:  07/19/20 241 lb 3.2 oz (109.4 kg)  01/31/19 248 lb (112.5 kg)  10/22/18 256 lb 12.8 oz (116.5 kg)      Studies/Labs Reviewed:   EKG:  EKG is  ordered today.  The ekg ordered today demonstrates NSR with nonspecific ST T changes, no acute change.  Recent Labs: No results found for requested labs within last 8760 hours.   Lipid Panel    Component Value Date/Time   CHOL 225 (H) 07/09/2016 1654   TRIG 343.0 (H) 07/09/2016 1654   HDL 37.50 (L) 07/09/2016 1654   CHOLHDL 6 07/09/2016 1654   VLDL 68.6 (H) 07/09/2016 1654   LDLCALC 64 02/11/2014 1422   LDLDIRECT 145.0 07/09/2016 1654    Additional studies/ records that were reviewed today include:    Echo 11/10/14: Left ventricle: The cavity size was normal. There was moderate   concentric hypertrophy. Systolic function was vigorous. The   estimated ejection fraction was in the range of 65% to 70%. Wall   motion was normal; there were no regional wall motion   abnormalities. Features are consistent with a pseudonormal left  ventricular filling  pattern, with concomitant abnormal relaxation   and increased filling pressure (grade 2 diastolic dysfunction).   Doppler parameters are consistent with elevated ventricular   end-diastolic filling pressure. - Aortic valve: Trileaflet; normal thickness leaflets. There was no   regurgitation. - Mitral valve: Structurally normal valve. There was no   regurgitation. - Left atrium: The atrium was mildly dilated. - Right ventricle: Systolic function was normal. - Right atrium: The atrium was normal in size. - Pulmonary arteries: Systolic pressure was within the normal   range. - Inferior vena cava: The vessel was normal in size. - Pericardium, extracardiac: There was no pericardial effusion.       ASSESSMENT:    1. History of CVA (cerebrovascular accident)   2. Atherosclerosis of native coronary artery of native heart without angina pectoris   3. History of recurrent deep vein thrombosis (DVT)   4. Essential hypertension   5. Diastolic dysfunction with chronic heart failure ([place])   6. Obstructive sleep apnea      PLAN:  In order of problems listed above:  CAD status post DES to the circumflex 2011, cath 04/24/2013 stable CAD with patent stent in the circumflex mild disease in the LAD moderate disease in the RCA.  Patient complains of atypical chest pain that his wife says he's never mentioned and he isn't reliable since his stroke. He also told me he smokes and he hasn't smoked in 12 yrs. EKG unchanged. His wife will continue to monitor his symptoms and call if changes.  History of CVA followed by neurology  History of recurrent DVT IVC filter in place not candidate for anticoagulation due to his CNS hemorrhage  Hypertension-BP controlled and recent labs at PCP in [facility] reviewed and stable  Chronic lower extremity edema-no edema today.  OSA on CPAP but not using- followed by Dr. Radford Pax   Medication Adjustments/Labs and Tests Ordered: Current medicines are reviewed  at length with the patient today.  Concerns regarding medicines are outlined above.  Medication changes, Labs and Tests ordered today are listed in the Patient Instructions below. Patient Instructions  Medication Instructions:  Your physician recommends that you continue on your current medications as directed. Please refer to the Current Medication list given to you today.  *If you need a refill on your cardiac medications before your next appointment, please call your pharmacy*   Lab Work: None If you have labs (blood work) drawn today and your tests are completely normal, you will receive your results only by: Marland Kitchen MyChart Message (if you have MyChart) OR . A paper copy in the mail If you have any lab test that is abnormal or we need to change your treatment, we will call you to review the results.   Testing/Procedures: None   Follow-Up: At [facility], you and your health needs are our priority.  As part of our continuing mission to provide you with exceptional heart care, we have created designated Provider Care Teams.  These Care Teams include your primary Cardiologist (physician) and Advanced Practice Providers (APPs -  Physician Assistants and Nurse Practitioners) who all work together to provide you with the care you need, when you need it.  We recommend signing up for the patient portal called "MyChart".  Sign up information is provided on this After Visit Summary.  MyChart is used to connect with patients for Virtual Visits (Telemedicine).  Patients are able to view lab/test results, encounter notes, upcoming appointments, etc.  Non-urgent messages can be sent to  your provider as well.   To learn more about what you can do with MyChart, go to [email or web address].    Your next appointment:   1 year(s)  The format for your next appointment:   In Person  Provider:   You may see Lauree Chandler, MD or one of the following Advanced Practice Providers on your designated  Care Team:    [name], PA-C  Ermalinda Barrios, PA-C    Other Instructions Your provider recommends that you maintain 150 minutes per week of moderate aerobic activity.      Sumner Boast, PA-C  07/19/2020 8:26 AM    [facility] [address], [place], [place]  [address] Phone: [phone]; Fax: [phone]

## 2020-07-19 ENCOUNTER — Ambulatory Visit: Payer: Medicare HMO | Admitting: Physician Assistant

## 2020-07-19 ENCOUNTER — Encounter: Payer: Self-pay | Admitting: Physician Assistant

## 2020-07-19 ENCOUNTER — Other Ambulatory Visit: Payer: Self-pay

## 2020-07-19 VITALS — BP 130/80 | HR 65 | Ht 71.0 in | Wt 241.2 lb

## 2020-07-19 DIAGNOSIS — I2510 Atherosclerotic heart disease of native coronary artery without angina pectoris: Secondary | ICD-10-CM

## 2020-07-19 DIAGNOSIS — Z8673 Personal history of transient ischemic attack (TIA), and cerebral infarction without residual deficits: Secondary | ICD-10-CM | POA: Diagnosis not present

## 2020-07-19 DIAGNOSIS — I10 Essential (primary) hypertension: Secondary | ICD-10-CM | POA: Diagnosis not present

## 2020-07-19 DIAGNOSIS — Z86718 Personal history of other venous thrombosis and embolism: Secondary | ICD-10-CM | POA: Diagnosis not present

## 2020-07-19 DIAGNOSIS — G4733 Obstructive sleep apnea (adult) (pediatric): Secondary | ICD-10-CM

## 2020-07-19 DIAGNOSIS — I5032 Chronic diastolic (congestive) heart failure: Secondary | ICD-10-CM

## 2020-07-19 MED ORDER — METOPROLOL SUCCINATE ER 100 MG PO TB24: 100.0000 mg | ORAL_TABLET | Freq: Two times a day (BID) | ORAL | 3 refills | Status: DC

## 2020-07-19 MED ORDER — HYDRALAZINE HCL 50 MG PO TABS: 100.0000 mg | ORAL_TABLET | Freq: Two times a day (BID) | ORAL | 3 refills | Status: DC

## 2020-07-19 NOTE — Patient Instructions (Signed)
Medication Instructions:  Your physician recommends that you continue on your current medications as directed. Please refer to the Current Medication list given to you today.  *If you need a refill on your cardiac medications before your next appointment, please call your pharmacy*   Lab Work: None If you have labs (blood work) drawn today and your tests are completely normal, you will receive your results only by: Marland Kitchen MyChart Message (if you have MyChart) OR . A paper copy in the mail If you have any lab test that is abnormal or we need to change your treatment, we will call you to review the results.   Testing/Procedures: None   Follow-Up: At [facility], you and your health needs are our priority.  As part of our continuing mission to provide you with exceptional heart care, we have created designated Provider Care Teams.  These Care Teams include your primary Cardiologist (physician) and Advanced Practice Providers (APPs -  Physician Assistants and Nurse Practitioners) who all work together to provide you with the care you need, when you need it.  We recommend signing up for the patient portal called "MyChart".  Sign up information is provided on this After Visit Summary.  MyChart is used to connect with patients for Virtual Visits (Telemedicine).  Patients are able to view lab/test results, encounter notes, upcoming appointments, etc.  Non-urgent messages can be sent to your provider as well.   To learn more about what you can do with MyChart, go to [email or web address].    Your next appointment:   1 year(s)  The format for your next appointment:   In Person  Provider:   You may see Lauree Chandler, MD or one of the following Advanced Practice Providers on your designated Care Team:    [name], PA-C  Ermalinda Barrios, PA-C    Other Instructions Your provider recommends that you maintain 150 minutes per week of moderate aerobic activity.

## 2021-08-16 ENCOUNTER — Other Ambulatory Visit: Payer: Self-pay | Admitting: Physician Assistant

## 2021-08-16 DIAGNOSIS — I5032 Chronic diastolic (congestive) heart failure: Secondary | ICD-10-CM

## 2021-08-16 DIAGNOSIS — I10 Essential (primary) hypertension: Secondary | ICD-10-CM

## 2021-08-16 DIAGNOSIS — I2510 Atherosclerotic heart disease of native coronary artery without angina pectoris: Secondary | ICD-10-CM

## 2021-12-06 NOTE — Progress Notes (Signed)
Chief Complaint  Patient presents with   Follow-up    CAD    History of Present Illness: 60 yo male with a complex medical history including CAD, prior hemorrhagic CVA in 2015 with many compilcations, sleep apnea, HTN and HLD who is here today for cardiac follow up. I met him in 2011 and arranged a stress test, an echo and carotid dopplers at the first visit. The carotid dopplers were normal. He came in for his stress test and complained of neurological changes. He was sent to Dr. Stones office and had MRI/MRA of the head and neck. MRA showed no occlusive disease. MRI showed non-specific white matter changes but no clear infarct, possibly related to hypertension. His echo showed septal and inferobasal hypokinesis with mild concentric hypertrophy, EF 45-50%. Left heart cath October 2011 with severe stenosis in the mid Circumflex. A drug eluting stent was placed in the Circumflex artery. Repeat echo August 2012 with PFO with positive bubble study. I arranged for him to see Dr. Burt Knack for PFO closure but he skipped his appointment. He was in [place] April 2013 and had acute onset of left arm and leg weakness. He was admitted there. CT head without acute stroke. Venous dopplers with chronic DVT right femoral vein. Carotids with <00% RICA stenosis, LICA ok. Echo 03/14/12 in [place] with normal LV size and function with PFO with positive bubble study. He was started on coumadin and Lovenox and left AMA from the hospital there. I saw him in our office 03/19/12. At that time, he continued to have left sided weakness. He had not been compliant with plans for Lovenox and coumadin. He was admitted for Heparin and coumadin. TEE was done 03/21/12 and showed normal LVEF. The atrial septum had interatrial septum fenestrations without flow across and few large bubbles noted in LA. This was not felt to require closure. He was maintained on coumadin at that time. Venous dopplers 2013 with no DVT.  He was diagnosed with  OSA and was started on CPAP. He was admitted to [facility] May 2014 with chest pain. Cardiac markers were negative. Cardiac cath 04/24/13 with stable CAD. Patent stent distal Circumflex, mild disease LAD, moderate disease RCA. He had a hemorraghic stroke in [place] in July 2015 and coumadin was stopped. He had a prolonged hospitalization with many complications following his CVA (complicated by intraventricular extension and hydrocephalus requiring VP shunt, encephalopathy, protein calorie malnutrition, acute resp failure requiring tracheostomy and PEG, bilateral LE DVT s/p IVC filter, enterobacter pneumonia, subsequent right hemiparesis and difficulty communicating). No new neurological events between 2015 and 2019. Echo in December 2015 with normal LV size and function. He was seen by Ermalinda Barrios, PA August 2021 and was doing well overall with some sharp chest pains.   He is here today for follow up. The patient denies any chest pain, dyspnea, palpitations, lower extremity edema, orthopnea, PND, dizziness, near syncope or syncope. His wife is with him today. He is on new therapy for his memory issues.   Primary Care Physician: Aletha Halim., PA-C  Past Medical History:  Diagnosis Date   Acute respiratory failure ([place]) 2015   a. Following BG hemorrhage - s/p trache with decannulation.   Arthritis    "knees" (09/25/2013)   Basal ganglia hemorrhage ([place]) 2015   a. 2015 - with intraventricular extension and hydrocephalus requiring right crani and VP shunt, complicated by encephalopathy, protein calorie malnutrition, acute resp failure req trache and PEG, BLE DVT s/p IVC, enterobacter PNA,  subsequent right hemiparesis and difficulty communicating.   BPH (benign prostatic hyperplasia)    CAD (coronary artery disease)    a. s/p promus DES circumflex artery 10/13/10. b. Cath 04/2013: stable disease but possible small vessel disease -Imdur added.   Carotid artery disease ([place])    a. 1-29% BICA by study  04/2014.   CVA (cerebral vascular accident) ([place]) 02/2010   a. CVA 2011, prior reported TIAs. b. CNS hemorrhage 2015.   CVA (cerebral vascular accident) ([place]) 06/2014   "hemorrhagic; in [place]; brain activity diminshed; ability to do anything; can't do ADLs on own"   DVT (deep venous thrombosis) ([place]) 2013   a. 2013. b. 2015 - reported BLE DVT s/p IVC filter.    Enterobacter cloacae pneumonia ([place]) 2015   Falls    Folate deficiency anemia    GERD (gastroesophageal reflux disease)    Hematuria 8/12   HLD (hyperlipidemia)    HTN (hypertension)    Hydrocephalus ([place]) 2015   a. 2/2 BG hemorrhage.   Incontinence of feces    Lower extremity edema    LV dysfunction    a. 2011 - EF 45-50%, subsequent 55-60% in 04/2014.   Noncompliance    Orthostatic hypotension    OSA on CPAP    "not using CPAP right now" (11/30/2014)   PFO (patent foramen ovale)    a. echo 8/12: EF 55-65%, grade 2 diast dysfnx; +PFO on bubble study. b. TEE 4/13: EF normal, atrial septum with suspicion for interatrial septum fenestrations without flow across and few large bubbles noted in LA.  This was not felt to require closure    Protein calorie malnutrition ([place])    Rheumatoid arthritis(714.0)    S/P percutaneous endoscopic gastrostomy (PEG) tube placement ([place]) 2015   TIA (transient ischemic attack)    "1-2; both after the one in 02/2010" (09/25/2013)   Type II diabetes mellitus ([place])    Urinary incontinence     Past Surgical History:  Procedure Laterality Date   CORONARY ANGIOPLASTY WITH STENT PLACEMENT  2012   "1" (09/24/2013)   KNEE ARTHROSCOPY Bilateral 2000's   2 on left and 3 on rt   LEFT HEART CATHETERIZATION WITH CORONARY ANGIOGRAM N/A 04/24/2013   Procedure: LEFT HEART CATHETERIZATION WITH CORONARY ANGIOGRAM;  Surgeon: Burnell Blanks, MD;  Location[facility] CATH LAB;  Service: Cardiovascular;  Laterality: N/A;   TEE WITHOUT CARDIOVERSION  03/21/2012   Procedure: TRANSESOPHAGEAL ECHOCARDIOGRAM (TEE);   Surgeon: Fay Records, MD;  Location: [facility] ENDOSCOPY;  Service: Cardiovascular;  Laterality: N/A;    Current Outpatient Medications  Medication Sig Dispense Refill   aspirin EC 81 MG tablet Take 81 mg by mouth daily.     atorvastatin (LIPITOR) 80 MG tablet TAKE ONE TABLET BY MOUTH DAILY 90 tablet 0   escitalopram (LEXAPRO) 20 MG tablet Take 20 mg by mouth daily.     hydrALAZINE (APRESOLINE) 50 MG tablet TAKE TWO TABLETS BY MOUTH IN THE MORNING AND AT BEDTIME 360 tablet 0   lisinopril (PRINIVIL,ZESTRIL) 40 MG tablet Take 40 mg by mouth daily.      memantine (NAMENDA) 10 MG tablet Take 1 tablet by mouth 2 (two) times daily.     metoprolol succinate (TOPROL-XL) 100 MG 24 hr tablet TAKE ONE TABLET BY MOUTH TWICE A DAY 180 tablet 0   pantoprazole (PROTONIX) 40 MG tablet Take 1 tablet by mouth daily.     rivastigmine (EXELON) 6 MG capsule Take 6 mg by mouth 2 (two) times daily.  traZODone (DESYREL) 100 MG tablet Take 200 mg by mouth at bedtime as needed.     donepezil (ARICEPT) 10 MG tablet Take 10 mg by mouth at bedtime. (Patient not taking: Reported on 12/07/2021)     potassium chloride (K-DUR) 10 MEQ tablet TAKE ONE TABLET BY MOUTH DAILY (Patient not taking: Reported on 12/07/2021) 90 tablet 0   No current facility-administered medications for this visit.    Allergies  Allergen Reactions   Iodine Anaphylaxis   Amlodipine Swelling and Other (See Comments)    Foot edema   Donepezil Other (See Comments)    Hallucinations,    Social History   Socioeconomic History   Marital status: Married    Spouse name: Not on file   Number of children: Not on file   Years of education: Not on file   Highest education level: Not on file  Occupational History   Not on file  Tobacco Use   Smoking status: Former    Packs/day: 1.00    Years: 35.00    Pack years: 35.00    Types: Cigarettes    Quit date: 03/10/2012    Years since quitting: 9.7   Smokeless tobacco: Never  Vaping Use   Vaping Use:  Never used  Substance and Sexual Activity   Alcohol use: No    Alcohol/week: 0.0 standard drinks   Drug use: No   Sexual activity: Not Currently    Partners: Female  Other Topics Concern   Not on file  Social History Narrative   Married, 5 kids; lives in [place]. 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    Social Determinants of Health   Financial Resource Strain: Not on file  Food Insecurity: Not on file  Transportation Needs: Not on file  Physical Activity: Not on file  Stress: Not on file  Social Connections: Not on file  Intimate Partner Violence: Not on file    Family History  Problem Relation Age of Onset   Heart attack Father    Stroke Father    Heart attack Sister    Hypertension Brother    Seizures Sister    Stroke Sister    Cancer Neg Hx    Kidney disease Neg Hx    Diabetes Neg Hx     Review of Systems:  As stated in the HPI and otherwise negative.   BP 128/80 (BP Location: Left Arm, Patient Position: Sitting, Cuff Size: Large)    Pulse 70    Ht '5\' 11"'  (1.803 m)    Wt 240 lb 6.4 oz (109 kg)    SpO2 99%    BMI 33.53 kg/m   Physical Examination:  General: Well developed, well nourished, NAD  HEENT: OP clear, mucus membranes moist  SKIN: warm, dry. No rashes. Neuro: No focal deficits  Musculoskeletal: Muscle strength 5/5 all ext  Psychiatric: Mood and affect normal  Neck: No JVD, no carotid bruits, no thyromegaly, no lymphadenopathy.  Lungs:Clear bilaterally, no wheezes, rhonci, crackles Cardiovascular: Regular rate and rhythm. No murmurs, gallops or rubs. Abdomen:Soft. Bowel sounds present. Non-tender.  Extremities: No lower extremity edema. Pulses are 2 + in the bilateral DP/PT.  Echo 11/10/14: Left ventricle: The cavity size was normal. There was moderate   concentric hypertrophy. Systolic function was vigorous. The   estimated ejection fraction was in the range of 65% to 70%. Wall   motion was normal; there were no regional wall  motion   abnormalities. Features are consistent with  a pseudonormal left   ventricular filling pattern, with concomitant abnormal relaxation   and increased filling pressure (grade 2 diastolic dysfunction).   Doppler parameters are consistent with elevated ventricular   end-diastolic filling pressure. - Aortic valve: Trileaflet; normal thickness leaflets. There was no   regurgitation. - Mitral valve: Structurally normal valve. There was no   regurgitation. - Left atrium: The atrium was mildly dilated. - Right ventricle: Systolic function was normal. - Right atrium: The atrium was normal in size. - Pulmonary arteries: Systolic pressure was within the normal   range. - Inferior vena cava: The vessel was normal in size. - Pericardium, extracardiac: There was no pericardial effusion.  EKG:  EKG is ordered today. The ekg ordered today demonstrates Sinus, baseline artifact  Recent Labs: No results found for requested labs within last 8760 hours.   Lipid Panel    Component Value Date/Time   CHOL 225 (H) 07/09/2016 1654   TRIG 343.0 (H) 07/09/2016 1654   HDL 37.50 (L) 07/09/2016 1654   CHOLHDL 6 07/09/2016 1654   VLDL 68.6 (H) 07/09/2016 1654   LDLCALC 64 02/11/2014 1422   LDLDIRECT 145.0 07/09/2016 1654     Wt Readings from Last 3 Encounters:  12/07/21 240 lb 6.4 oz (109 kg)  07/19/20 241 lb 3.2 oz (109.4 kg)  01/31/19 248 lb (112.5 kg)     Other studies Reviewed: Additional studies/ records that were reviewed today include: . Review of the above records demonstrates:    Assessment and Plan:   History of recurrent DVT: First in 2013, then BLE DVT in 2015 with IVC filter placed at that time. He is not felt to be a candidate for anticoagulation due to his CNS hemorrhage  2. History of CVA: He is followed in Neurology  3. HTN: BP controlled on hydralazine, Toprol and Lisinopril. He has not tolerated Norvasc in the past.  4. CAD without angina: He has no chest pain.  Continue ASA, statin and beta blocker.   5. Sleep apnea: He CPAP but has not been wearing it. He has been seen by Dr. Radford Pax.   Current medicines are reviewed at length with the patient today.  The patient does not have concerns regarding medicines.  The following changes have been made:  no change  Labs/ tests ordered today include:   Orders Placed This Encounter  Procedures   EKG 12-Lead    Disposition:   FU with me or office APP on my care team in 6 months.   Signed, Lauree Chandler, MD 12/07/2021 4:32 PM    [facility] [address], [place], [place]  [address] Phone: [phone]; Fax: [phone]

## 2021-12-07 ENCOUNTER — Other Ambulatory Visit: Payer: Self-pay

## 2021-12-07 ENCOUNTER — Ambulatory Visit: Payer: Medicare HMO | Admitting: Cardiovascular Disease

## 2021-12-07 ENCOUNTER — Encounter: Payer: Self-pay | Admitting: Cardiovascular Disease

## 2021-12-07 VITALS — BP 128/80 | HR 70 | Ht 71.0 in | Wt 240.4 lb

## 2021-12-07 DIAGNOSIS — G4733 Obstructive sleep apnea (adult) (pediatric): Secondary | ICD-10-CM | POA: Diagnosis not present

## 2021-12-07 DIAGNOSIS — I2510 Atherosclerotic heart disease of native coronary artery without angina pectoris: Secondary | ICD-10-CM

## 2021-12-07 DIAGNOSIS — I10 Essential (primary) hypertension: Secondary | ICD-10-CM | POA: Diagnosis not present

## 2021-12-07 NOTE — Patient Instructions (Signed)
Medication Instructions:  Your physician recommends that you continue on your current medications as directed. Please refer to the Current Medication list given to you today.  *If you need a refill on your cardiac medications before your next appointment, please call your pharmacy*   Lab Work: None ordered   If you have labs (blood work) drawn today and your tests are completely normal, you will receive your results only by: [place] (if you have MyChart) OR A paper copy in the mail If you have any lab test that is abnormal or we need to change your treatment, we will call you to review the results.   Testing/Procedures: None ordered    Follow-Up: At [facility], you and your health needs are our priority.  As part of our continuing mission to provide you with exceptional heart care, we have created designated Provider Care Teams.  These Care Teams include your primary Cardiologist (physician) and Advanced Practice Providers (APPs -  Physician Assistants and Nurse Practitioners) who all work together to provide you with the care you need, when you need it.  We recommend signing up for the patient portal called "MyChart".  Sign up information is provided on this After Visit Summary.  MyChart is used to connect with patients for Virtual Visits (Telemedicine).  Patients are able to view lab/test results, encounter notes, upcoming appointments, etc.  Non-urgent messages can be sent to your provider as well.   To learn more about what you can do with MyChart, go to [email or web address].    Your next appointment:   12 month(s)  The format for your next appointment:   In Person  Provider:   Lauree Chandler, MD     Other Instructions None

## 2021-12-18 ENCOUNTER — Other Ambulatory Visit: Payer: Self-pay

## 2021-12-18 MED ORDER — HYDRALAZINE HCL 50 MG PO TABS: ORAL_TABLET | ORAL | 3 refills | Status: DC

## 2021-12-25 ENCOUNTER — Other Ambulatory Visit: Payer: Self-pay

## 2021-12-25 DIAGNOSIS — I2510 Atherosclerotic heart disease of native coronary artery without angina pectoris: Secondary | ICD-10-CM

## 2021-12-25 DIAGNOSIS — I5032 Chronic diastolic (congestive) heart failure: Secondary | ICD-10-CM

## 2021-12-25 DIAGNOSIS — I10 Essential (primary) hypertension: Secondary | ICD-10-CM

## 2021-12-25 MED ORDER — METOPROLOL SUCCINATE ER 100 MG PO TB24: 100.0000 mg | ORAL_TABLET | Freq: Two times a day (BID) | ORAL | 3 refills | Status: AC

## 2022-03-26 ENCOUNTER — Telehealth: Payer: Self-pay | Admitting: Cardiovascular Disease

## 2022-03-26 NOTE — Telephone Encounter (Signed)
[place] from [facility] [facility] returning call.  ?

## 2022-03-26 NOTE — Telephone Encounter (Signed)
Left  message for [place] at [facility] to call back. ? ?Pt has been on current dose of Toprol XL since increased by PharmD in 2019. ?

## 2022-03-26 NOTE — Telephone Encounter (Signed)
?  Pt c/o medication issue: ? ?1. Name of Medication: metoprolol succinate (TOPROL-XL) 100 MG 24 hr tablet ? ?2. How are you currently taking this medication (dosage and times per day)? Take 1 tablet (100 mg total) by mouth 2 (two) times daily. Take with or immediately following a meal. ? ?3. Are you having a reaction (difficulty breathing--STAT)?  ? ?4. What is your medication issue? [place] with [facility] calling, she would like to confirm if pt needs to take this meds twice a day ?

## 2022-03-26 NOTE — Telephone Encounter (Signed)
[place], NP at [facility] states she is pt's primary care provider and wanted to confirm the patient is to be taking Toprol XL 100 mg BID.  Adv it was changed to this in July 2019 by PharmD for BP.  No other questions. ?

## 2022-04-26 ENCOUNTER — Other Ambulatory Visit: Payer: Self-pay | Admitting: *Deleted

## 2022-04-26 DIAGNOSIS — Z122 Encounter for screening for malignant neoplasm of respiratory organs: Secondary | ICD-10-CM

## 2022-04-26 DIAGNOSIS — Z87891 Personal history of nicotine dependence: Secondary | ICD-10-CM

## 2022-05-16 ENCOUNTER — Encounter: Payer: Self-pay | Admitting: Acute Care

## 2022-05-17 ENCOUNTER — Inpatient Hospital Stay: Admission: RE | Admit: 2022-05-17 | Payer: Medicare HMO | Source: Ambulatory Visit

## 2022-06-13 ENCOUNTER — Encounter: Payer: Medicare Other | Admitting: Acute Care

## 2022-07-10 ENCOUNTER — Ambulatory Visit (INDEPENDENT_AMBULATORY_CARE_PROVIDER_SITE_OTHER): Payer: Medicare (Managed Care) | Admitting: Acute Care

## 2022-07-10 ENCOUNTER — Encounter: Payer: Self-pay | Admitting: Acute Care

## 2022-07-10 DIAGNOSIS — Z87891 Personal history of nicotine dependence: Secondary | ICD-10-CM | POA: Diagnosis not present

## 2022-07-10 NOTE — Progress Notes (Signed)
Virtual Visit via Telephone Note  I connected with Tony Davis on 07/10/22 at  4:00 PM EDT by telephone and verified that I am speaking with the correct person using two identifiers.  Location: Patient:  At home Provider: [place], [place], [place], [address]    I discussed the limitations, risks, security and privacy concerns of performing an evaluation and management service by telephone and the availability of in person appointments. I also discussed with the patient that there may be a patient responsible charge related to this service. The patient expressed understanding and agreed to proceed.   Shared Decision Making Visit Lung Cancer Screening Program [phone])   Eligibility: Age 61 y.o. Pack Years Smoking History Calculation 45 pack year smoking history (# packs/per year x # years smoked) Recent History of coughing up blood  no Unexplained weight loss? no ( >Than 15 pounds within the last 6 months ) Prior History Lung / other cancer no (Diagnosis within the last 5 years already requiring surveillance chest CT Scans). Smoking Status Former Smoker Former Smokers: Years since quit: 10 years  Quit Date:  2013  Visit Components: Discussion included one or more decision making aids. yes Discussion included risk/benefits of screening. yes Discussion included potential follow up diagnostic testing for abnormal scans. yes Discussion included meaning and risk of over diagnosis. yes Discussion included meaning and risk of False Positives. yes Discussion included meaning of total radiation exposure. yes  Counseling Included: Importance of adherence to annual lung cancer LDCT screening. yes Impact of comorbidities on ability to participate in the program. yes Ability and willingness to under diagnostic treatment. yes  Smoking Cessation Counseling: Current Smokers:  Discussed importance of smoking cessation. yes Information about tobacco cessation classes and  interventions provided to patient. yes Patient provided with "ticket" for LDCT Scan. yes Symptomatic Patient. no  Counseling NA Diagnosis Code: Tobacco Use Z72.0 Asymptomatic Patient yes  Counseling (Intermediate counseling: > three minutes counseling) G8366 Former Smokers:  Discussed the importance of maintaining cigarette abstinence. yes Diagnosis Code: Personal History of Nicotine Dependence. Q94.765 Information about tobacco cessation classes and interventions provided to patient. Yes Patient provided with "ticket" for LDCT Scan. yes Written Order for Lung Cancer Screening with LDCT placed in Epic. Yes (CT Chest Lung Cancer Screening Low Dose W/O CM) [id number] Z12.2-Screening of respiratory organs Z87.891-Personal history of nicotine dependence  I spent 25 minutes of face to face time/virtual visit time  with  [name]. discussing the risks and benefits of lung cancer screening. We took the time to pause the power point at intervals to allow for questions to be asked and answered to ensure understanding. We discussed that he had taken the single most powerful action possible to decrease his risk of developing lung cancer when he quit smoking. I counseled him to remain smoke free, and to contact me if he ever had the desire to smoke again so that I can provide resources and tools to help support the effort to remain smoke free. We discussed the time and location of the scan, and that either  Doroteo Glassman RN, Joella Prince, RN or I  or I will call / send a letter with the results within  24-72 hours of receiving them. He has the office contact information in the event he needs to speak with me,  he verbalized understanding of all of the above and had no further questions upon leaving the office.     I explained to the patient  that there has been a high incidence of coronary artery disease noted on these exams. I explained that this is a non-gated exam therefore degree or severity cannot be  determined. This patient is on statin therapy. I have asked the patient to follow-up with their PCP regarding any incidental finding of coronary artery disease and management with diet or medication as they feel is clinically indicated. The patient verbalized understanding of the above and had no further questions.     Tony Spatz, NP 07/10/2022

## 2022-07-10 NOTE — Patient Instructions (Signed)
Thank you for participating in the [facility] Cancer Screening Program. It was our pleasure to meet you today. We will call you with the results of your scan within the next few days. Your scan will be assigned a Lung RADS category score by the physicians reading the scans.  This Lung RADS score determines follow up scanning.  See below for description of categories, and follow up screening recommendations. We will be in touch to schedule your follow up screening annually or based on recommendations of our providers. We will fax a copy of your scan results to your Primary Care Physician, or the physician who referred you to the program, to ensure they have the results. Please call the office if you have any questions or concerns regarding your scanning experience or results.  Our office number is [phone]. Please speak with Denise Phelps, RN. , or  Denise Buckner RN, They are  our Lung Cancer Screening RN.'s If They are unavailable when you call, Please leave a message on the voice mail. We will return your call at our earliest convenience.This voice mail is monitored several times a day.  Remember, if your scan is normal, we will scan you annually as long as you continue to meet the criteria for the program. (Age 55-77, Current smoker or smoker who has quit within the last 15 years). If you are a smoker, remember, quitting is the single most powerful action that you can take to decrease your risk of lung cancer and other pulmonary, breathing related problems. We know quitting is hard, and we are here to help.  Please let us know if there is anything we can do to help you meet your goal of quitting. If you are a former smoker, congratulations. We are proud of you! Remain smoke free! Remember you can refer friends or family members through the number above.  We will screen them to make sure they meet criteria for the program. Thank you for helping us take better care of you by  participating in Lung Screening.  You can receive free nicotine replacement therapy ( patches, gum or mints) by calling 1-800-QUIT NOW. Please call so we can get you on the path to becoming  a non-smoker. I know it is hard, but you can do this!  Lung RADS Categories:  Lung RADS 1: no nodules or definitely non-concerning nodules.  Recommendation is for a repeat annual scan in 12 months.  Lung RADS 2:  nodules that are non-concerning in appearance and behavior with a very low likelihood of becoming an active cancer. Recommendation is for a repeat annual scan in 12 months.  Lung RADS 3: nodules that are probably non-concerning , includes nodules with a low likelihood of becoming an active cancer.  Recommendation is for a 6-month repeat screening scan. Often noted after an upper respiratory illness. We will be in touch to make sure you have no questions, and to schedule your 6-month scan.  Lung RADS 4 A: nodules with concerning findings, recommendation is most often for a follow up scan in 3 months or additional testing based on our provider's assessment of the scan. We will be in touch to make sure you have no questions and to schedule the recommended 3 month follow up scan.  Lung RADS 4 B:  indicates findings that are concerning. We will be in touch with you to schedule additional diagnostic testing based on our provider's  assessment of the scan.  Other options for assistance in smoking cessation (   As covered by your insurance benefits)  Hypnosis for smoking cessation  [employer] [phone]  Acupuncture for smoking cessation  [facility] [phone]   

## 2022-07-13 ENCOUNTER — Telehealth: Payer: Self-pay | Admitting: Acute Care

## 2022-07-13 ENCOUNTER — Inpatient Hospital Stay: Admission: RE | Admit: 2022-07-13 | Payer: Medicare Other | Source: Ambulatory Visit

## 2022-07-13 NOTE — Telephone Encounter (Signed)
Attempted to call pt's wife to reschedule CT but unable to leave message due to voicemail not being set up. Will call back.

## 2022-07-19 NOTE — Telephone Encounter (Signed)
Spoke with wife and she would like [place] to schedule through [facility] for the transportation and then call her with appt date/time.  She says appt needs to be on a M-W-F and anytime works but prefers morning if not too early.

## 2022-07-19 NOTE — Telephone Encounter (Signed)
Left message for Leda Gauze at [facility] to call for assistance in scheduling LDCT appt/transportation.

## 2022-07-20 NOTE — Telephone Encounter (Signed)
Appt for LDCT arranged with Leda Gauze at [facility] for 08/20/22 at 3:30pm.  This was the earliest date available for transportation.  Appointment has been mailed to the patient's home address.

## 2022-08-15 ENCOUNTER — Ambulatory Visit: Payer: Medicare (Managed Care) | Attending: Cardiovascular Disease | Admitting: Cardiovascular Disease

## 2022-08-15 ENCOUNTER — Encounter: Payer: Self-pay | Admitting: Cardiovascular Disease

## 2022-08-15 VITALS — BP 134/98 | HR 68 | Ht 71.0 in | Wt 256.0 lb

## 2022-08-15 DIAGNOSIS — I2510 Atherosclerotic heart disease of native coronary artery without angina pectoris: Secondary | ICD-10-CM

## 2022-08-15 DIAGNOSIS — G4733 Obstructive sleep apnea (adult) (pediatric): Secondary | ICD-10-CM | POA: Diagnosis not present

## 2022-08-15 DIAGNOSIS — I10 Essential (primary) hypertension: Secondary | ICD-10-CM | POA: Diagnosis not present

## 2022-08-15 NOTE — Patient Instructions (Signed)
Medication Instructions:  Your physician recommends that you continue on your current medications as directed. Please refer to the Current Medication list given to you today.  *If you need a refill on your cardiac medications before your next appointment, please call your pharmacy*   Lab Work: none If you have labs (blood work) drawn today and your tests are completely normal, you will receive your results only by: [place] (if you have MyChart) OR A paper copy in the mail If you have any lab test that is abnormal or we need to change your treatment, we will call you to review the results.   Testing/Procedures: none   Follow-Up: At [facility], you and your health needs are our priority.  As part of our continuing mission to provide you with exceptional heart care, we have created designated Provider Care Teams.  These Care Teams include your primary Cardiologist (physician) and Advanced Practice Providers (APPs -  Physician Assistants and Nurse Practitioners) who all work together to provide you with the care you need, when you need it.  We recommend signing up for the patient portal called "MyChart".  Sign up information is provided on this After Visit Summary.  MyChart is used to connect with patients for Virtual Visits (Telemedicine).  Patients are able to view lab/test results, encounter notes, upcoming appointments, etc.  Non-urgent messages can be sent to your provider as well.   To learn more about what you can do with MyChart, go to [email or web address].    Your next appointment:   12 month(s)  The format for your next appointment:   In Person  Provider:   Lauree Chandler, MD     Other Instructions   Important Information About Sugar

## 2022-08-15 NOTE — Progress Notes (Signed)
Chief Complaint  Patient presents with   Coronary Artery Disease    History of Present Illness: 61 yo male with a complex medical history including CAD, prior hemorrhagic CVA in 2015 with many compilcations, sleep apnea, HTN and HLD who is here today for cardiac follow up. I met him in 2011 and arranged a stress test, an echo and carotid dopplers at the first visit. The carotid dopplers were normal. He came in for his stress test and complained of neurological changes. He was sent to Dr. Stones office and had MRI/MRA of the head and neck. MRA showed no occlusive disease. MRI showed non-specific white matter changes but no clear infarct, possibly related to hypertension. His echo showed septal and inferobasal hypokinesis with mild concentric hypertrophy, EF 45-50%. Left heart cath October 2011 with severe stenosis in the mid Circumflex. A drug eluting stent was placed in the Circumflex artery. Repeat echo August 2012 with PFO with positive bubble study. I arranged for him to see Dr. Burt Knack for PFO closure but he skipped his appointment. He was in [place] April 2013 and had acute onset of left arm and leg weakness. He was admitted there. CT head without acute stroke. Venous dopplers with chronic DVT right femoral vein. Carotids with <09% RICA stenosis, LICA ok. Echo 03/14/12 in [place] with normal LV size and function with PFO with positive bubble study. He was started on coumadin and Lovenox and left AMA from the hospital there. I saw him in our office 03/19/12. At that time, he continued to have left sided weakness. He had not been compliant with plans for Lovenox and coumadin. He was admitted for Heparin and coumadin. TEE was done 03/21/12 and showed normal LVEF. The atrial septum had interatrial septum fenestrations without flow across and few large bubbles noted in LA. This was not felt to require closure. He was maintained on coumadin at that time. Venous dopplers 2013 with no DVT.  He was diagnosed  with OSA and was started on CPAP. He was admitted to [facility] May 2014 with chest pain. Cardiac markers were negative. Cardiac cath 04/24/13 with stable CAD. Patent stent distal Circumflex, mild disease LAD, moderate disease RCA. He had a hemorraghic stroke in [place] in July 2015 and coumadin was stopped. He had a prolonged hospitalization with many complications following his CVA (complicated by intraventricular extension and hydrocephalus requiring VP shunt, encephalopathy, protein calorie malnutrition, acute resp failure requiring tracheostomy and PEG, bilateral LE DVT s/p IVC filter, enterobacter pneumonia, subsequent right hemiparesis and difficulty communicating). No new neurological events between 2015 and 20239. Echo in December 2015 with normal LV size and function.   He is here today for follow up. The patient denies any chest pain, dyspnea, palpitations, lower extremity edema, orthopnea, PND, dizziness, near syncope or syncope.     Primary Care Physician: Aletha Halim., PA-C  Past Medical History:  Diagnosis Date   Acute respiratory failure ([place]) 2015   a. Following BG hemorrhage - s/p trache with decannulation.   Arthritis    "knees" (09/25/2013)   Basal ganglia hemorrhage ([place]) 2015   a. 2015 - with intraventricular extension and hydrocephalus requiring right crani and VP shunt, complicated by encephalopathy, protein calorie malnutrition, acute resp failure req trache and PEG, BLE DVT s/p IVC, enterobacter PNA, subsequent right hemiparesis and difficulty communicating.   BPH (benign prostatic hyperplasia)    CAD (coronary artery disease)    a. s/p promus DES circumflex artery 10/13/10. b. Cath 04/2013: stable disease  but possible small vessel disease -Imdur added.   Carotid artery disease ([place])    a. 1-29% BICA by study 04/2014.   CVA (cerebral vascular accident) ([place]) 02/2010   a. CVA 2011, prior reported TIAs. b. CNS hemorrhage 2015.   CVA (cerebral vascular accident) ([place]) 06/2014    "hemorrhagic; in [place]; brain activity diminshed; ability to do anything; can't do ADLs on own"   DVT (deep venous thrombosis) ([place]) 2013   a. 2013. b. 2015 - reported BLE DVT s/p IVC filter.    Enterobacter cloacae pneumonia ([place]) 2015   Falls    Folate deficiency anemia    GERD (gastroesophageal reflux disease)    Hematuria 8/12   HLD (hyperlipidemia)    HTN (hypertension)    Hydrocephalus ([place]) 2015   a. 2/2 BG hemorrhage.   Incontinence of feces    Lower extremity edema    LV dysfunction    a. 2011 - EF 45-50%, subsequent 55-60% in 04/2014.   Noncompliance    Orthostatic hypotension    OSA on CPAP    "not using CPAP right now" (11/30/2014)   PFO (patent foramen ovale)    a. echo 8/12: EF 55-65%, grade 2 diast dysfnx; +PFO on bubble study. b. TEE 4/13: EF normal, atrial septum with suspicion for interatrial septum fenestrations without flow across and few large bubbles noted in LA.  This was not felt to require closure    Protein calorie malnutrition ([place])    Rheumatoid arthritis(714.0)    S/P percutaneous endoscopic gastrostomy (PEG) tube placement ([place]) 2015   TIA (transient ischemic attack)    "1-2; both after the one in 02/2010" (09/25/2013)   Type II diabetes mellitus ([place])    Urinary incontinence     Past Surgical History:  Procedure Laterality Date   CORONARY ANGIOPLASTY WITH STENT PLACEMENT  2012   "1" (09/24/2013)   KNEE ARTHROSCOPY Bilateral 2000's   2 on left and 3 on rt   LEFT HEART CATHETERIZATION WITH CORONARY ANGIOGRAM N/A 04/24/2013   Procedure: LEFT HEART CATHETERIZATION WITH CORONARY ANGIOGRAM;  Surgeon: Burnell Blanks, MD;  Location: [facility] CATH LAB;  Service: Cardiovascular;  Laterality: N/A;   TEE WITHOUT CARDIOVERSION  03/21/2012   Procedure: TRANSESOPHAGEAL ECHOCARDIOGRAM (TEE);  Surgeon: Fay Records, MD;  Location: [facility] ENDOSCOPY;  Service: Cardiovascular;  Laterality: N/A;    Current Outpatient Medications  Medication Sig Dispense Refill    aspirin EC 81 MG tablet Take 81 mg by mouth daily.     atorvastatin (LIPITOR) 80 MG tablet TAKE ONE TABLET BY MOUTH DAILY 90 tablet 0   donepezil (ARICEPT) 10 MG tablet Take 10 mg by mouth at bedtime.     escitalopram (LEXAPRO) 20 MG tablet Take 20 mg by mouth daily.     hydrALAZINE (APRESOLINE) 50 MG tablet TAKE TWO TABLETS BY MOUTH IN THE MORNING AND AT BEDTIME 360 tablet 3   lisinopril (PRINIVIL,ZESTRIL) 40 MG tablet Take 40 mg by mouth daily.      memantine (NAMENDA) 10 MG tablet Take 1 tablet by mouth 2 (two) times daily.     metoprolol succinate (TOPROL-XL) 100 MG 24 hr tablet Take 1 tablet (100 mg total) by mouth 2 (two) times daily. Take with or immediately following a meal. 180 tablet 3   pantoprazole (PROTONIX) 40 MG tablet Take 1 tablet by mouth daily.     potassium chloride (K-DUR) 10 MEQ tablet TAKE ONE TABLET BY MOUTH DAILY 90 tablet 0   rivastigmine (EXELON) 6 MG capsule  Take 6 mg by mouth 2 (two) times daily.     traZODone (DESYREL) 100 MG tablet Take 200 mg by mouth at bedtime as needed.     No current facility-administered medications for this visit.    Allergies  Allergen Reactions   Iodine Anaphylaxis   Amlodipine Swelling and Other (See Comments)    Foot edema   Donepezil Other (See Comments)    Hallucinations,    Social History   Socioeconomic History   Marital status: Married    Spouse name: Not on file   Number of children: Not on file   Years of education: Not on file   Highest education level: Not on file  Occupational History   Not on file  Tobacco Use   Smoking status: Former    Packs/day: 1.00    Years: 35.00    Total pack years: 35.00    Types: Cigarettes    Quit date: 03/10/2012    Years since quitting: 10.4   Smokeless tobacco: Never  Vaping Use   Vaping Use: Never used  Substance and Sexual Activity   Alcohol use: No    Alcohol/week: 0.0 standard drinks of alcohol   Drug use: No   Sexual activity: Not Currently    Partners: Female   Other Topics Concern   Not on file  Social History Narrative   Married, 5 kids; lives in [place]. 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    Social Determinants of Health   Financial Resource Strain: Not on file  Food Insecurity: Not on file  Transportation Needs: Not on file  Physical Activity: Not on file  Stress: Not on file  Social Connections: Not on file  Intimate Partner Violence: Not on file    Family History  Problem Relation Age of Onset   Heart attack Father    Stroke Father    Heart attack Sister    Hypertension Brother    Seizures Sister    Stroke Sister    Cancer Neg Hx    Kidney disease Neg Hx    Diabetes Neg Hx     Review of Systems:  As stated in the HPI and otherwise negative.   BP (!) 134/98   Pulse 68   Ht '5\' 11"'  (1.803 m)   Wt 256 lb (116.1 kg)   SpO2 99%   BMI 35.70 kg/m   Physical Examination:  General: Well developed, well nourished, NAD  HEENT: OP clear, mucus membranes moist  SKIN: warm, dry. No rashes. Neuro: No focal deficits  Musculoskeletal: Muscle strength 5/5 all ext  Psychiatric: Mood and affect normal  Neck: No JVD, no carotid bruits, no thyromegaly, no lymphadenopathy.  Lungs:Clear bilaterally, no wheezes, rhonci, crackles Cardiovascular: Regular rate and rhythm. No murmurs, gallops or rubs. Abdomen:Soft. Bowel sounds present. Non-tender.  Extremities: No lower extremity edema. Pulses are 2 + in the bilateral DP/PT.  Echo 11/10/14: Left ventricle: The cavity size was normal. There was moderate   concentric hypertrophy. Systolic function was vigorous. The   estimated ejection fraction was in the range of 65% to 70%. Wall   motion was normal; there were no regional wall motion   abnormalities. Features are consistent with a pseudonormal left   ventricular filling pattern, with concomitant abnormal relaxation   and increased filling pressure (grade 2 diastolic dysfunction).   Doppler parameters are consistent  with elevated ventricular   end-diastolic filling pressure. - Aortic valve: Trileaflet; normal thickness leaflets. There was  no   regurgitation. - Mitral valve: Structurally normal valve. There was no   regurgitation. - Left atrium: The atrium was mildly dilated. - Right ventricle: Systolic function was normal. - Right atrium: The atrium was normal in size. - Pulmonary arteries: Systolic pressure was within the normal   range. - Inferior vena cava: The vessel was normal in size. - Pericardium, extracardiac: There was no pericardial effusion.  EKG:  EKG is not ordered today. The ekg ordered today demonstrates   Recent Labs: No results found for requested labs within last 365 days.   Lipid Panel    Component Value Date/Time   CHOL 225 (H) 07/09/2016 1654   TRIG 343.0 (H) 07/09/2016 1654   HDL 37.50 (L) 07/09/2016 1654   CHOLHDL 6 07/09/2016 1654   VLDL 68.6 (H) 07/09/2016 1654   LDLCALC 64 02/11/2014 1422   LDLDIRECT 145.0 07/09/2016 1654     Wt Readings from Last 3 Encounters:  08/15/22 256 lb (116.1 kg)  12/07/21 240 lb 6.4 oz (109 kg)  07/19/20 241 lb 3.2 oz (109.4 kg)     Other studies Reviewed: Additional studies/ records that were reviewed today include: . Review of the above records demonstrates:    Assessment and Plan:   History of recurrent DVT: First in 2013, then BLE DVT in 2015 with IVC filter placed at that time. He is not felt to be a candidate for anticoagulation due to his CNS hemorrhage  2. History of CVA: He is followed in Neurology  3. HTN: BP is well controlled. He has not tolerated Norvasc in the past.  4. CAD without angina: No chest pain. LDL 56 in November 2022. Continue ASA, beta blocker and statin.    5. Sleep apnea: He has CPAP but has not been wearing it. He has been seen by Dr. Radford Pax.   Current medicines are reviewed at length with the patient today.  The patient does not have concerns regarding medicines.  The following changes  have been made:  no change  Labs/ tests ordered today include:   No orders of the defined types were placed in this encounter.   Disposition:   F/U with me or office APP on my care team in 12 months.   Signed, Lauree Chandler, MD 08/15/2022 3:31 PM    [facility] [address], [place], [place]  [address] Phone: [phone]; Fax: [phone]

## 2022-08-20 ENCOUNTER — Ambulatory Visit
Admission: RE | Admit: 2022-08-20 | Discharge: 2022-08-20 | Disposition: A | Discharge status: Home / Self Care | Payer: Medicare Other | Source: Ambulatory Visit | Attending: Acute Care | Admitting: Acute Care

## 2022-08-20 DIAGNOSIS — Z122 Encounter for screening for malignant neoplasm of respiratory organs: Secondary | ICD-10-CM

## 2022-08-20 DIAGNOSIS — Z87891 Personal history of nicotine dependence: Secondary | ICD-10-CM

## 2022-08-21 ENCOUNTER — Telehealth: Payer: Self-pay | Admitting: Internal Medicine

## 2022-08-21 NOTE — Telephone Encounter (Signed)
Noted in triage. 

## 2022-08-21 NOTE — Telephone Encounter (Signed)
Called to give report on patient.  Stated that they need a call back to document that they gave the report.  CB# [phone].

## 2022-08-21 NOTE — Telephone Encounter (Signed)
Received call report from [place] with [facility] on patient's LSCT done on 08/20/22. Judson Roch, NP  please review the result/impression copied below:  IMPRESSION: 1. Lung-RADS 1S, negative. Continue annual screening with low-dose chest CT without contrast in 12 months. 2. The "S" modifier represents a potentially clinically significant non pulmonary finding. Soft tissue density 2.3 cm lesion in the left kidney could represent a hemorrhagic/proteinaceous cyst or solid neoplasm. New since 2014. Presuming this has not been evaluated previously, recommend renal protocol pre and post-contrast CT or MRI. 3. Bilateral nephrolithiasis. 4. Aortic atherosclerosis (ICD10-I70.0), coronary artery atherosclerosis and emphysema (ICD10-J43.9). 5. Moderate bilateral gynecomastia.  Please advise, thank you.

## 2022-08-22 ENCOUNTER — Telehealth: Payer: Self-pay | Admitting: Acute Care

## 2022-08-22 DIAGNOSIS — Z87891 Personal history of nicotine dependence: Secondary | ICD-10-CM

## 2022-08-22 DIAGNOSIS — Z122 Encounter for screening for malignant neoplasm of respiratory organs: Secondary | ICD-10-CM

## 2022-08-22 NOTE — Telephone Encounter (Signed)
I have attempted to call the patient with the results of their  Low Dose CT Chest Lung cancer screening scan. There was no answer. I have left a HIPPA compliant VM requesting the patient call the office for the scan results. I included the office contact information in the message. We will await his return call. If no return call we will continue to call until patient is contacted.    Pt scan is a LR 1, but there is a Soft tissue density 2.3 cm lesion in the left kidney could represent a hemorrhagic/proteinaceous cyst or solid neoplasm. New since 2014. Presuming this has not been evaluated previously, recommend renal protocol pre and post-contrast CT or MRI. He also has Bilateral nephrolithiasis( kidney stones) and CAD, atherosclerosis and emphysema. He is on lipitor, last echo was 2015, he is followed by cardiology.   We will need to call him again if he does not respond to my message. Thanks so much

## 2022-08-27 NOTE — Telephone Encounter (Signed)
Called and spoke with pt's wife Tony Davis ([place]) and advised of pt's lung screening CT results. Advised that there were no concerning lung nodules seen and that we would repeat his lung screening CT in 1 year. Emphysema seen that is related to smoking history. Coronary calcifications seen . Pt is on a statin drug daily.  Also advised of a renal lesion that was seen that need further testing. She stated that pt's PCP is through [facility].  Called and spoke with Daria at [facility] and she asked that we fax the CT report to [place], [place] who is pt's PCP through [facility]. CT report faxed with follow up recommendations noted. I advised pt's wife that if she hasnt heard from [facility] within a week to call them regarding follow up testing for the renal lesion. She verbalized understanding and had no further questions.

## 2022-08-29 ENCOUNTER — Other Ambulatory Visit: Payer: Self-pay | Admitting: Family Medicine

## 2022-08-29 DIAGNOSIS — Z87891 Personal history of nicotine dependence: Secondary | ICD-10-CM

## 2022-08-29 DIAGNOSIS — G4733 Obstructive sleep apnea (adult) (pediatric): Secondary | ICD-10-CM

## 2022-08-29 DIAGNOSIS — E669 Obesity, unspecified: Secondary | ICD-10-CM

## 2022-09-14 ENCOUNTER — Ambulatory Visit
Admission: RE | Admit: 2022-09-14 | Discharge: 2022-09-14 | Disposition: A | Discharge status: Home / Self Care | Payer: No Typology Code available for payment source | Source: Ambulatory Visit | Attending: Family Medicine | Admitting: Family Medicine

## 2022-09-14 DIAGNOSIS — Z87891 Personal history of nicotine dependence: Secondary | ICD-10-CM

## 2022-09-14 DIAGNOSIS — E669 Obesity, unspecified: Secondary | ICD-10-CM

## 2022-09-14 DIAGNOSIS — G4733 Obstructive sleep apnea (adult) (pediatric): Secondary | ICD-10-CM

## 2022-09-14 MED ORDER — GADOPICLENOL 0.5 MMOL/ML IV SOLN
10.0000 mL | Freq: Once | INTRAVENOUS | Status: AC | PRN
  Administered 2022-09-14: 10 mL via INTRAVENOUS

## 2022-12-13 ENCOUNTER — Other Ambulatory Visit: Payer: Self-pay | Admitting: Urology

## 2022-12-26 ENCOUNTER — Telehealth: Payer: Self-pay | Admitting: Cardiovascular Disease

## 2022-12-26 NOTE — Telephone Encounter (Signed)
I left a message for the patient to call our office back to schedule a tele visit for pre-op clearance.

## 2022-12-26 NOTE — Telephone Encounter (Signed)
   Name: Tony Davis.  DOB: [birth year]  MRN: [id number]  Primary Cardiologist: Lauree Chandler, MD   Preoperative team, please contact this patient and set up a phone call appointment for further preoperative risk assessment. Please obtain consent and complete medication review. Thank you for your help.  I confirm that guidance regarding antiplatelet and oral anticoagulation therapy has been completed and, if necessary, noted below.  Regarding ASA therapy, we recommend continuation of ASA throughout the perioperative period.  However, if the surgeon feels that cessation of ASA is required in the perioperative period, it may be stopped 5-7 days prior to surgery with a plan to resume it as soon as felt to be feasible from a surgical standpoint in the post-operative period.    Mable Fill, Marissa Nestle, NP 12/26/2022, 9:42 AM [place]

## 2022-12-26 NOTE — Telephone Encounter (Signed)
   Pre-operative Risk Assessment    Patient Name: Tony Davis.  DOB: 01/12/1961 MRN: [id number]      Request for Surgical Clearance    Procedure:  Laparoscopic  Radical Left Nephrectomy  Date of Surgery:  Clearance  01-18-23                                 Surgeon:  Dr Emogene Morgan Group or Practice Name:   Phone number:  [phone] Fax number:  [phone]   Type of Clearance Requested:   - Medical  - Pharmacy:  Hold Aspirin     Type of Anesthesia:  General    Additional requests/questions:    SignedGlyn Ade   12/26/2022, 9:10 AM

## 2022-12-27 ENCOUNTER — Telehealth: Payer: Self-pay | Admitting: *Deleted

## 2022-12-27 NOTE — Telephone Encounter (Signed)
Pt has been scheduled for a tele visit, 12/31/22 9;40.  NEEDS TO CALL [phone] and ask for [name] , as pt is part of [facility].

## 2022-12-27 NOTE — Telephone Encounter (Signed)
Pt is not [facility], per wife, DPR on file. We have scheduled a tele visit for, 12/31/22 9:40.   NEEDS TO CALL [phone] [name]    .  Consent on file / medications reconciled.    Patient Consent for Virtual Visit        Tony Davis. has provided verbal consent on 12/27/2022 for a virtual visit (video or telephone).   CONSENT FOR VIRTUAL VISIT FOR:  Tony Davis.  By participating in this virtual visit I agree to the following:  I hereby voluntarily request, consent and authorize [facility] and its employed or contracted physicians, physician assistants, nurse practitioners or other licensed health care professionals (the Practitioner), to provide me with telemedicine health care services (the "Services") as deemed necessary by the treating Practitioner. I acknowledge and consent to receive the Services by the Practitioner via telemedicine. I understand that the telemedicine visit will involve communicating with the Practitioner through live audiovisual communication technology and the disclosure of certain medical information by electronic transmission. I acknowledge that I have been given the opportunity to request an in-person assessment or other available alternative prior to the telemedicine visit and am voluntarily participating in the telemedicine visit.  I understand that I have the right to withhold or withdraw my consent to the use of telemedicine in the course of my care at any time, without affecting my right to future care or treatment, and that the Practitioner or I may terminate the telemedicine visit at any time. I understand that I have the right to inspect all information obtained and/or recorded in the course of the telemedicine visit and may receive copies of available information for a reasonable fee.  I understand that some of the potential risks of receiving the Services via telemedicine include:  Delay or interruption in medical evaluation due  to technological equipment failure or disruption; Information transmitted may not be sufficient (e.g. poor resolution of images) to allow for appropriate medical decision making by the Practitioner; and/or  In rare instances, security protocols could fail, causing a breach of personal health information.  Furthermore, I acknowledge that it is my responsibility to provide information about my medical history, conditions and care that is complete and accurate to the best of my ability. I acknowledge that Practitioner's advice, recommendations, and/or decision may be based on factors not within their control, such as incomplete or inaccurate data provided by me or distortions of diagnostic images or specimens that may result from electronic transmissions. I understand that the practice of medicine is not an exact science and that Practitioner makes no warranties or guarantees regarding treatment outcomes. I acknowledge that a copy of this consent can be made available to me via my patient portal ([facility]), or I can request a printed copy by calling the office of [facility].    I understand that my insurance will be billed for this visit.   I have read or had this consent read to me. I understand the contents of this consent, which adequately explains the benefits and risks of the Services being provided via telemedicine.  I have been provided ample opportunity to ask questions regarding this consent and the Services and have had my questions answered to my satisfaction. I give my informed consent for the services to be provided through the use of telemedicine in my medical care

## 2022-12-31 ENCOUNTER — Ambulatory Visit: Payer: Medicare (Managed Care) | Attending: Internal Medicine | Admitting: Nurse Practitioner

## 2022-12-31 ENCOUNTER — Encounter: Payer: Self-pay | Admitting: Nurse Practitioner

## 2022-12-31 DIAGNOSIS — Z01810 Encounter for preprocedural cardiovascular examination: Secondary | ICD-10-CM

## 2022-12-31 NOTE — Progress Notes (Signed)
Virtual Visit via Telephone Note   Because of Tony BETKE Jr.'s co-morbid illnesses, he is at least at moderate risk for complications without adequate follow up.  This format is felt to be most appropriate for this patient at this time.  The patient did not have access to video technology/had technical difficulties with video requiring transitioning to audio format only (telephone).  All issues noted in this document were discussed and addressed.  No physical exam could be performed with this format.  Please refer to the patient's chart for his consent to telehealth for [facility].  Evaluation Performed:  Preoperative cardiovascular risk assessment _____________   Date:  12/31/2022   Patient ID:  Tony Banker., DOB [birth year], MRN [id number] Patient Location:  Home Provider location:   Office  Primary Care Provider:  Aletha Halim., PA-C Primary Cardiologist:  Lauree Chandler, MD  Chief Complaint / Patient Profile   62 y.o. y/o male with a h/o CAD s/p DES in LCx in 2011, recurrent DVT, CVA, hypertension, hyperlipidemia, and OSA who is pending laparoscopic radical left nephrectomy on 01/18/2023 with Dr. Louis Meckel and presents today for telephonic preoperative cardiovascular risk assessment.  History of Present Illness    Tony Dacanay. is a 62 y.o. male who presents via audio/video conferencing for a telehealth visit today accompanied by his wife (DPR on file).  Pt was last seen in cardiology clinic on 08/15/2022 by Dr. Angelena Form.  At that time Tony Banker. was doing well. The patient is now pending procedure as outlined above. Since his last visit, he has been stable from a cardiac standpoint.   He denies chest pain, palpitations, dyspnea, pnd, orthopnea, n, v, dizziness, syncope, edema, weight gain, or early satiety. All other systems reviewed and are otherwise negative except as noted above.   Past Medical History    Past Medical History:   Diagnosis Date   Acute respiratory failure ([place]) 2015   a. Following BG hemorrhage - s/p trache with decannulation.   Arthritis    "knees" (09/25/2013)   Basal ganglia hemorrhage ([place]) 2015   a. 2015 - with intraventricular extension and hydrocephalus requiring right crani and VP shunt, complicated by encephalopathy, protein calorie malnutrition, acute resp failure req trache and PEG, BLE DVT s/p IVC, enterobacter PNA, subsequent right hemiparesis and difficulty communicating.   BPH (benign prostatic hyperplasia)    CAD (coronary artery disease)    a. s/p promus DES circumflex artery 10/13/10. b. Cath 04/2013: stable disease but possible small vessel disease -Imdur added.   Carotid artery disease ([place])    a. 1-29% BICA by study 04/2014.   CVA (cerebral vascular accident) ([place]) 02/2010   a. CVA 2011, prior reported TIAs. b. CNS hemorrhage 2015.   CVA (cerebral vascular accident) ([place]) 06/2014   "hemorrhagic; in [place]; brain activity diminshed; ability to do anything; can't do ADLs on own"   DVT (deep venous thrombosis) ([place]) 2013   a. 2013. b. 2015 - reported BLE DVT s/p IVC filter.    Enterobacter cloacae pneumonia ([place]) 2015   Falls    Folate deficiency anemia    GERD (gastroesophageal reflux disease)    Hematuria 8/12   HLD (hyperlipidemia)    HTN (hypertension)    Hydrocephalus ([place]) 2015   a. 2/2 BG hemorrhage.   Incontinence of feces    Lower extremity edema    LV dysfunction    a. 2011 - EF 45-50%, subsequent 55-60% in 04/2014.   Noncompliance  Orthostatic hypotension    OSA on CPAP    "not using CPAP right now" (11/30/2014)   PFO (patent foramen ovale)    a. echo 8/12: EF 55-65%, grade 2 diast dysfnx; +PFO on bubble study. b. TEE 4/13: EF normal, atrial septum with suspicion for interatrial septum fenestrations without flow across and few large bubbles noted in LA.  This was not felt to require closure    Protein calorie malnutrition ([place])    Rheumatoid arthritis(714.0)     S/P percutaneous endoscopic gastrostomy (PEG) tube placement ([place]) 2015   TIA (transient ischemic attack)    "1-2; both after the one in 02/2010" (09/25/2013)   Type II diabetes mellitus ([place])    Urinary incontinence    Past Surgical History:  Procedure Laterality Date   CORONARY ANGIOPLASTY WITH STENT PLACEMENT  2012   "1" (09/24/2013)   KNEE ARTHROSCOPY Bilateral 2000's   2 on left and 3 on rt   LEFT HEART CATHETERIZATION WITH CORONARY ANGIOGRAM N/A 04/24/2013   Procedure: LEFT HEART CATHETERIZATION WITH CORONARY ANGIOGRAM;  Surgeon: Burnell Blanks, MD;  Location: [facility] CATH LAB;  Service: Cardiovascular;  Laterality: N/A;   TEE WITHOUT CARDIOVERSION  03/21/2012   Procedure: TRANSESOPHAGEAL ECHOCARDIOGRAM (TEE);  Surgeon: Fay Records, MD;  Location[facility] ENDOSCOPY;  Service: Cardiovascular;  Laterality: N/A;    Allergies  Allergies  Allergen Reactions   Iodine Anaphylaxis   Amlodipine Swelling and Other (See Comments)    Foot edema   Donepezil Other (See Comments)    Hallucinations,    Home Medications    Prior to Admission medications   Medication Sig Start Date End Date Taking? Authorizing Provider  aspirin EC 81 MG tablet Take 81 mg by mouth daily.    [provider]  atorvastatin (LIPITOR) 80 MG tablet TAKE ONE TABLET BY MOUTH DAILY 08/16/18   Janith Lima, MD  Cholecalciferol (VITAMIN D3) 25 MCG (1000 UT) CAPS Take 1 capsule by mouth daily.    [provider]  escitalopram (LEXAPRO) 20 MG tablet Take 20 mg by mouth daily.    [provider]  famotidine (PEPCID) 20 MG tablet Take 20 mg by mouth daily.    [provider]  hydrALAZINE (APRESOLINE) 50 MG tablet TAKE TWO TABLETS BY MOUTH IN THE MORNING AND AT BEDTIME 12/18/21   Burnell Blanks, MD  lisinopril (PRINIVIL,ZESTRIL) 40 MG tablet Take 40 mg by mouth daily.  04/15/18   [provider]  memantine (NAMENDA) 10 MG tablet Take 1 tablet by mouth 2 (two) times daily.  10/30/21   [provider]  metoprolol succinate (TOPROL-XL) 100 MG 24 hr tablet Take 1 tablet (100 mg total) by mouth 2 (two) times daily. Take with or immediately following a meal. 12/25/21   Burnell Blanks, MD  rivastigmine (EXELON) 6 MG capsule Take 6 mg by mouth 2 (two) times daily. 11/11/21   [provider]  spironolactone (ALDACTONE) 25 MG tablet Take 25 mg by mouth daily.    [provider]  traZODone (DESYREL) 100 MG tablet Take 200 mg by mouth at bedtime as needed. 11/11/21   [provider]    Physical Exam    Vital Signs:  Tony Banker. does not have vital signs available for review today.  Given telephonic nature of communication, physical exam is limited. AAOx3. NAD. Normal affect.  Speech and respirations are unlabored.  Accessory Clinical Findings    None  Assessment & Plan    1.  Preoperative Cardiovascular Risk Assessment:  According to the Revised Cardiac Risk Index (RCRI), his Perioperative Risk of Major Cardiac Event is (%): 6.6. His Functional Capacity in METs is: 4.3 according to the [facility] Activity Status Index (DASI).Therefore, based on ACC/AHA guidelines, patient would be at acceptable risk for the planned procedure without further cardiovascular testing.The patient was advised that if he develops new symptoms prior to surgery to contact our office to arrange for a follow-up visit, and he verbalized understanding.  Regarding ASA therapy, we recommend continuation of ASA throughout the perioperative period.  However, if the surgeon feels that cessation of ASA is required in the perioperative period, it may be stopped 5-7 days prior to surgery with a plan to resume it as soon as felt to be feasible from a surgical standpoint in the post-operative period.    A copy of this note will be routed to requesting surgeon.  Time:   Today, I have spent 7 minutes with the patient with telehealth technology discussing medical  history, symptoms, and management plan.     Lenna Sciara, NP  12/31/2022, 9:51 AM

## 2023-01-08 ENCOUNTER — Ambulatory Visit (INDEPENDENT_AMBULATORY_CARE_PROVIDER_SITE_OTHER): Payer: Medicare (Managed Care) | Admitting: Diagnostic Neuroimaging

## 2023-01-08 ENCOUNTER — Encounter: Payer: Self-pay | Admitting: Diagnostic Neuroimaging

## 2023-01-08 VITALS — BP 129/79 | HR 65 | Ht 71.0 in | Wt 256.8 lb

## 2023-01-08 DIAGNOSIS — F01A0 Vascular dementia, mild, without behavioral disturbance, psychotic disturbance, mood disturbance, and anxiety: Secondary | ICD-10-CM

## 2023-01-08 DIAGNOSIS — I610 Nontraumatic intracerebral hemorrhage in hemisphere, subcortical: Secondary | ICD-10-CM | POA: Diagnosis not present

## 2023-01-08 NOTE — Progress Notes (Addendum)
Anesthesia Review:  [facility]: [facility]  Cardiologist : DR Angelena Form  LOV 08/15/22 LOV 12/31/22- [place]  Pulmonology- Anselm Lis, NP LOV 07/10/22  Neurology- DR Peneumalli LOV 01/08/23   Chest x-ray  CT chest- 08/21/22  EKG : 01/11/23  Echo : Stress test: Cardiac Cath :  Activity level: cannot do a flight of stairs without difficutly  Sleep Study/ CPAP : has sleep apnea does not use cpap  Fasting Blood Sugar :      / Checks Blood Sugar -- times a day:   Blood Thinner/ Instructions /Last Dose: ASA / Instructions/ Last Dose   81 mg- stop 5 days prior per wife    PT with hx of  3 strokes last one in 2015 per wife.   which affected memory . PT uses walker.  Incontinent of urine and stool.  Wears diapers.  [place]. [facility].  Wife dispenses meds.  Pt feeds himself.  Unable to dress himself PT has filter in place for clots.  Hx of cardiac stent.  PT has shunt in head.   Wife to bring DOS.  Wife reports at preop that pt ? Going to rehab after surgery.    Wife reports at proep pt to be on a clear liquid diet the day before surgery per instructions from surgeon.  Gave wife a list of clear liquid diet  since wife stated she did not have a clear liquid list from MD office.  This order was not in order set in epic.  Asked wife if pt was to have any bowel prep .  Wife stated no.     Blood pressure at proep was 147/100.  PT denies any chest pain shortness of breath, dizziness or headache.  Wife reports am meds given to pt just prior to appt.  PT has not eaten breakfast and stated he was hungry.  PT given 2 packs of crackers and wife given crackers and also given water.     PT remembers some things at times and at others not.  AT beginning of preop appt pt was crying but could not remember what he was having done.  Then at end of preop appt he did remember he was having surgery.  Both pt and wife signed consent form.  NO POA or guardian on file per wife.    Wife has  copy of am and pm med list from [facility].  Marked on the am and pm med list what meds pt is to take am of surgery.

## 2023-01-08 NOTE — Patient Instructions (Signed)
  VASCULAR DEMENTIA (since 2015 left BG ICH; s/p right craniotomy and right VP shunt) - stable; continue med mgmt of BP, lipids - continue rivastigmine and memantine - continue supportive care via [facility] triad   VP shunt - stable; monitor   PERIOPERATIVE STROKE MGMT OF ANTIPLATELET (anticipated kidney surgery) - There may be a small risk of stroke by stopping antiplatelet or anticoagulation therapy in the peri-procedural period. If patient agrees to these risks, then patient may come off aspirin for 3 days in preparation for procedure, and restarted as soon as possible post-procedure.

## 2023-01-08 NOTE — Progress Notes (Signed)
[facility]  PATIENT: Tony Davis. DOB: [birth year]  REFERRING CLINICIAN: Janifer Adie, MD HISTORY FROM: patient REASON FOR VISIT: new consult   HISTORICAL  CHIEF COMPLAINT:  Chief Complaint  Patient presents with   New Patient (Initial Visit)    Patient in room #7 with his wife. Pt her today to discuss his hx of CVA.    HISTORY OF PRESENT ILLNESS:   62 year old male here for evaluation of vascular dementia.  For stroke occurred in 2011 with left-sided weakness and visual field loss.  In 2013 patient had DVT was started on anticoagulation.  He was also found to have PFO.  In 2015 had left basal ganglia hemorrhage with intraventricular extension, hydrocephalus requiring right-sided craniotomy and VP shunt.  Since then has had progressive cognitive decline.   REVIEW OF SYSTEMS: Full 14 system review of systems performed and negative with exception of: As per HPI.  ALLERGIES: Allergies  Allergen Reactions   Iodine Anaphylaxis   Amlodipine Swelling and Other (See Comments)    Foot edema   Donepezil Other (See Comments)    Hallucinations,    HOME MEDICATIONS: Outpatient Medications Prior to Visit  Medication Sig Dispense Refill   aspirin EC 81 MG tablet Take 81 mg by mouth daily.     atorvastatin (LIPITOR) 80 MG tablet TAKE ONE TABLET BY MOUTH DAILY 90 tablet 0   Cholecalciferol (VITAMIN D3) 25 MCG (1000 UT) CAPS Take 1 capsule by mouth daily.     escitalopram (LEXAPRO) 20 MG tablet Take 20 mg by mouth daily.     famotidine (PEPCID) 20 MG tablet Take 20 mg by mouth daily.     hydrALAZINE (APRESOLINE) 50 MG tablet TAKE TWO TABLETS BY MOUTH IN THE MORNING AND AT BEDTIME 360 tablet 3   lisinopril (PRINIVIL,ZESTRIL) 40 MG tablet Take 40 mg by mouth daily.      memantine (NAMENDA) 10 MG tablet Take 1 tablet by mouth 2 (two) times daily.     metoprolol succinate (TOPROL-XL) 100 MG 24 hr tablet Take 1 tablet (100 mg total) by mouth 2 (two) times  daily. Take with or immediately following a meal. 180 tablet 3   pantoprazole (PROTONIX) 40 MG tablet Take 40 mg by mouth daily.     rivastigmine (EXELON) 6 MG capsule Take 6 mg by mouth 2 (two) times daily.     spironolactone (ALDACTONE) 25 MG tablet Take 25 mg by mouth daily.     traZODone (DESYREL) 100 MG tablet Take 200 mg by mouth at bedtime as needed.     No facility-administered medications prior to visit.    PAST MEDICAL HISTORY: Past Medical History:  Diagnosis Date   Acute respiratory failure ([place]) 2015   a. Following BG hemorrhage - s/p trache with decannulation.   Arthritis    "knees" (09/25/2013)   Basal ganglia hemorrhage ([place]) 2015   a. 2015 - with intraventricular extension and hydrocephalus requiring right crani and VP shunt, complicated by encephalopathy, protein calorie malnutrition, acute resp failure req trache and PEG, BLE DVT s/p IVC, enterobacter PNA, subsequent right hemiparesis and difficulty communicating.   BPH (benign prostatic hyperplasia)    CAD (coronary artery disease)    a. s/p promus DES circumflex artery 10/13/10. b. Cath 04/2013: stable disease but possible small vessel disease -Imdur added.   Carotid artery disease ([place])    a. 1-29% BICA by study 04/2014.   CVA (cerebral vascular accident) ([place]) 02/2010   a. CVA 2011, prior reported TIAs.  b. CNS hemorrhage 2015.   CVA (cerebral vascular accident) ([place]) 06/2014   "hemorrhagic; in [place]; brain activity diminshed; ability to do anything; can't do ADLs on own"   DVT (deep venous thrombosis) ([place]) 2013   a. 2013. b. 2015 - reported BLE DVT s/p IVC filter.    Enterobacter cloacae pneumonia 2015   Falls    Folate deficiency anemia    GERD (gastroesophageal reflux disease)    Hematuria 8/12   HLD (hyperlipidemia)    HTN (hypertension)    Hydrocephalus ([place]) 2015   a. 2/2 BG hemorrhage.   Incontinence of feces    Lower extremity edema    LV dysfunction    a. 2011 - EF 45-50%, subsequent 55-60% in  04/2014.   Noncompliance    Orthostatic hypotension    OSA on CPAP    "not using CPAP right now" (11/30/2014)   PFO (patent foramen ovale)    a. echo 8/12: EF 55-65%, grade 2 diast dysfnx; +PFO on bubble study. b. TEE 4/13: EF normal, atrial septum with suspicion for interatrial septum fenestrations without flow across and few large bubbles noted in LA.  This was not felt to require closure    Protein calorie malnutrition ([place])    Rheumatoid arthritis(714.0)    S/P percutaneous endoscopic gastrostomy (PEG) tube placement ([place]) 2015   TIA (transient ischemic attack)    "1-2; both after the one in 02/2010" (09/25/2013)   Type II diabetes mellitus ([place])    Urinary incontinence     PAST SURGICAL HISTORY: Past Surgical History:  Procedure Laterality Date   CORONARY ANGIOPLASTY WITH STENT PLACEMENT  2012   "1" (09/24/2013)   KNEE ARTHROSCOPY Bilateral 2000's   2 on left and 3 on rt   LEFT HEART CATHETERIZATION WITH CORONARY ANGIOGRAM N/A 04/24/2013   Procedure: LEFT HEART CATHETERIZATION WITH CORONARY ANGIOGRAM;  Surgeon: Burnell Blanks, MD;  Location: [facility] CATH LAB;  Service: Cardiovascular;  Laterality: N/A;   TEE WITHOUT CARDIOVERSION  03/21/2012   Procedure: TRANSESOPHAGEAL ECHOCARDIOGRAM (TEE);  Surgeon: Fay Records, MD;  Location: [facility] ENDOSCOPY;  Service: Cardiovascular;  Laterality: N/A;    FAMILY HISTORY: Family History  Problem Relation Age of Onset   Heart attack Father    Stroke Father    Heart attack Sister    Hypertension Brother    Seizures Sister    Stroke Sister    Cancer Neg Hx    Kidney disease Neg Hx    Diabetes Neg Hx     SOCIAL HISTORY: Social History   Socioeconomic History   Marital status: Married    Spouse name: Not on file   Number of children: Not on file   Years of education: Not on file   Highest education level: Not on file  Occupational History   Not on file  Tobacco Use   Smoking status: Former    Packs/day: 1.00    Years: 35.00     Total pack years: 35.00    Types: Cigarettes    Quit date: 03/10/2012    Years since quitting: 10.8   Smokeless tobacco: Never  Vaping Use   Vaping Use: Never used  Substance and Sexual Activity   Alcohol use: No    Alcohol/week: 0.0 standard drinks of alcohol   Drug use: No   Sexual activity: Not Currently    Partners: Female  Other Topics Concern   Not on file  Social History Narrative   Married, 5 kids; lives in [place]. 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    Social Determinants of Health   Financial Resource Strain: Not on file  Food Insecurity: Not on file  Transportation Needs: Not on file  Physical Activity: Not on file  Stress: Not on file  Social Connections: Not on file  Intimate Partner Violence: Not on file     PHYSICAL EXAM  GENERAL EXAM/CONSTITUTIONAL: Vitals:  Vitals:   01/08/23 1115  BP: 129/79  Pulse: 65  Weight: 256 lb 12.8 oz (116.5 kg)  Height: '5\' 11"'$  (1.803 m)   Body mass index is 35.82 kg/m. Wt Readings from Last 3 Encounters:  01/08/23 256 lb 12.8 oz (116.5 kg)  08/15/22 256 lb (116.1 kg)  12/07/21 240 lb 6.4 oz (109 kg)   Patient is in no distress; well developed, nourished and groomed; neck is supple  CARDIOVASCULAR: Examination of carotid arteries is normal; no carotid bruits Regular rate and rhythm, no murmurs Examination of peripheral vascular system by observation and palpation is normal  EYES: Ophthalmoscopic exam of optic discs and posterior segments is normal; no papilledema or hemorrhages No results found.  MUSCULOSKELETAL: Gait, strength, tone, movements noted in Neurologic exam below  NEUROLOGIC: MENTAL STATUS:     01/08/2023   11:22 AM 08/01/2016   10:07 AM  MMSE - Mini Mental State Exam  Not completed:  Unable to complete  Orientation to time 2 1  Orientation to Place 4 0  Registration 3 3  Attention/ Calculation 2 0  Recall 3 0  Language- name 2 objects 2 0  Language- repeat 1 1   Language- follow 3 step command 3 0  Language- read & follow direction 1 0  Write a sentence 0 0  Copy design 1 0  Total score 22 5   awake, SLEEPY, oriented to person   CRANIAL NERVE:  2nd, 3rd, 4th, 6th - pupils equal and reactive to light, visual fields full to confrontation, extraocular muscles intact, no nystagmus; BILATERAL PROPTOSIS; LEFT PTOSIS 5th - facial sensation symmetric 7th - facial strength --> DECR RIGHT NL FOLD 8th - hearing intact 9th - palate elevates symmetrically, uvula midline 11th - shoulder shrug symmetric 12th - tongue protrusion midline  MOTOR:  normal bulk and tone, full strength in the BUE, BLE  SENSORY:  normal and symmetric to light touch  COORDINATION:  finger-nose-finger, fine finger movements SLOW  REFLEXES:  deep tendon reflexes 1+ and symmetric  GAIT/STATION:  narrow based gait; USING WALKER     DIAGNOSTIC DATA (LABS, IMAGING, TESTING) - I reviewed patient records, labs, notes, testing and imaging myself where available.  Lab Results  Component Value Date   WBC 7.1 09/21/2018   HGB 13.4 09/21/2018   HCT 41.3 09/21/2018   MCV 91.6 09/21/2018   PLT 281 09/21/2018      Component Value Date/Time   NA 140 09/21/2018 0157   NA 142 04/18/2018 0929   K 3.4 (L) 09/21/2018 0157   CL 103 09/21/2018 0157   CO2 29 09/21/2018 0157   GLUCOSE 96 09/21/2018 0157   BUN 13 09/21/2018 0157   BUN 17 04/18/2018 0929   CREATININE 1.07 09/21/2018 0157   CREATININE 1.08 06/17/2015 1626   CALCIUM 8.9 09/21/2018 0157   PROT 6.6 09/21/2018 0157   ALBUMIN 3.6 09/21/2018 0157   AST 22 09/21/2018 0157   ALT 19 09/21/2018 0157   ALKPHOS 94 09/21/2018 0157   BILITOT 1.3 (H) 09/21/2018 0157   GFRNONAA >60 09/21/2018 0157  GFRAA >60 09/21/2018 0157   Lab Results  Component Value Date   CHOL 225 (H) 07/09/2016   HDL 37.50 (L) 07/09/2016   LDLCALC 64 02/11/2014   LDLDIRECT 145.0 07/09/2016   TRIG 343.0 (H) 07/09/2016   CHOLHDL 6 07/09/2016    Lab Results  Component Value Date   HGBA1C 5.7 (H) 09/21/2018   Lab Results  Component Value Date   VITAMINB12 191 09/21/2018   Lab Results  Component Value Date   TSH 0.684 09/21/2018    09/20/18 CT head [I reviewed images myself and agree with interpretation. -VRP]  1. No acute findings. No intracranial mass, hemorrhage or edema. Ventriculostomy shunt is stable in position, with stable size and configuration of the ventricles. 2. Chronic small vessel ischemic change in the white matter.  10/23/20 MRI brain 1. No acute intracranial abnormality.  2. Chronic infarcts and chronic hemorrhages as above including a  remote left basal ganglia hemorrhage with chronic intraventricular  blood products.  3. New chronic microhemorrhages in the thalami.  4. Unchanged size of the ventricles with shunt in place.  5. Moderate chronic small vessel ischemic disease.     ASSESSMENT AND PLAN  62 y.o. year old male here with:   Dx:  1. Basal ganglia hemorrhage ([place])   2. Mild vascular dementia without behavioral disturbance, psychotic disturbance, mood disturbance, or anxiety (HCC)      PLAN:  VASCULAR DEMENTIA (since 2015 left BG ICH; s/p right craniotomy and right VP shunt) - stable; continue med mgmt of BP, lipids - continue rivastigmine and memantine - continue supportive care via [facility] triad  VP shunt - stable; monitor  PERIOPERATIVE STROKE MGMT OF ANTIPLATELET (anticipated kidney surgery) - There may be a small risk of stroke by stopping antiplatelet or anticoagulation therapy in the peri-procedural period. If patient agrees to these risks, then patient may come off aspirin for 3 days in preparation for procedure, and restarted as soon as possible post-procedure.   Return for pending if symptoms worsen or fail to improve, return to PCP.    Penni Bombard, MD 12/28/4172, 08:14 AM Certified in Neurology, Neurophysiology and Neuroimaging  [facility] [address] [place], [place] [address] [phone]

## 2023-01-08 NOTE — Patient Instructions (Addendum)
SURGICAL WAITING ROOM VISITATION  Patients having surgery or a procedure may have no more than 2 support people in the waiting area - these visitors may rotate.    Children under the age of 71 must have an adult with them who is not the patient.  Due to an increase in RSV and influenza rates and associated hospitalizations, children ages 38 and under may not visit patients in [place].  If the patient needs to stay at the hospital during part of their recovery, the visitor guidelines for inpatient rooms apply. Pre-op nurse will coordinate an appropriate time for 1 support person to accompany patient in pre-op.  This support person may not rotate.    Please refer to the [name] website for the visitor guidelines for Inpatients (after your surgery is over and you are in a regular room).       Your procedure is scheduled on:  01/18/2023    Report to [facility] Main Entrance    Report to admitting at  0730 AM   Call this number if you have problems the morning of surgery [phone]   Clear liquid diet the day before surgery.            Clear liquids include the following:          Water         Coffee and tea with no milk, cream or creamer, suger and sweetener are ok  Non citrus juices such as apple juice, white cranberry juice, and white grape juice         Jello ( on red)          Fruit ices and popsicles( no red)           Gatorade ( no red)                        Oral Hygiene is also important to reduce your risk of infection.                                    Remember - BRUSH YOUR TEETH THE MORNING OF SURGERY WITH YOUR REGULAR TOOTHPASTE  DENTURES WILL BE REMOVED PRIOR TO SURGERY PLEASE DO NOT APPLY "Poly grip" OR ADHESIVES!!!   Do NOT smoke after Midnight   Take these medicines the morning of surgery with A SIP OF WATER:  lexapro, pepcid, hydralazine, namenda, toprol, protonix , exelon   DO NOT TAKE ANY ORAL DIABETIC MEDICATIONS DAY OF YOUR  SURGERY  Bring CPAP mask and tubing day of surgery.                              You may not have any metal on your body including hair pins, jewelry, and body piercing             Do not wear make-up, lotions, powders, perfumes/cologne, or deodorant  Do not wear nail polish including gel and S&S, artificial/acrylic nails, or any other type of covering on natural nails including finger and toenails. If you have artificial nails, gel coating, etc. that needs to be removed by a nail salon please have this removed prior to surgery or surgery may need to be canceled/ delayed if the surgeon/ anesthesia feels like they are unable to be safely monitored.   Do not shave  48  hours prior to surgery.               Men may shave face and neck.   Do not bring valuables to the hospital. [place].   Contacts, glasses, dentures or bridgework may not be worn into surgery.   Bring small overnight bag day of surgery.   DO NOT [place]. PHARMACY WILL DISPENSE MEDICATIONS LISTED ON YOUR MEDICATION LIST TO YOU DURING YOUR ADMISSION [place]!    Patients discharged on the day of surgery will not be allowed to drive home.  Someone NEEDS to stay with you for the first 24 hours after anesthesia.   Special Instructions: Bring a copy of your healthcare power of attorney and living will documents the day of surgery if you haven't scanned them before.              Please read over the following fact sheets you were given: IF [place] [phone]   If you received a COVID test during your pre-op visit  it is requested that you wear a mask when out in public, stay away from anyone that may not be feeling well and notify your surgeon if you develop symptoms. If you test positive for Covid or have been in contact with anyone that has tested positive in the last 10 days please  notify you surgeon.    [facility] - Preparing for Surgery Before surgery, you can play an important role.  Because skin is not sterile, your skin needs to be as free of germs as possible.  You can reduce the number of germs on your skin by washing with CHG (chlorahexidine gluconate) soap before surgery.  CHG is an antiseptic cleaner which kills germs and bonds with the skin to continue killing germs even after washing. Please DO NOT use if you have an allergy to CHG or antibacterial soaps.  If your skin becomes reddened/irritated stop using the CHG and inform your nurse when you arrive at Short Stay. Do not shave (including legs and underarms) for at least 48 hours prior to the first CHG shower.  You may shave your face/neck. Please follow these instructions carefully:  1.  Shower with CHG Soap the night before surgery and the  morning of Surgery.  2.  If you choose to wash your hair, wash your hair first as usual with your  normal  shampoo.  3.  After you shampoo, rinse your hair and body thoroughly to remove the  shampoo.                           4.  Use CHG as you would any other liquid soap.  You can apply chg directly  to the skin and wash                       Gently with a scrungie or clean washcloth.  5.  Apply the CHG Soap to your body ONLY FROM THE NECK DOWN.   Do not use on face/ open                           Wound or open sores. Avoid contact with eyes, ears mouth and genitals (private  parts).                       Wash face,  Genitals (private parts) with your normal soap.             6.  Wash thoroughly, paying special attention to the area where your surgery  will be performed.  7.  Thoroughly rinse your body with warm water from the neck down.  8.  DO NOT shower/wash with your normal soap after using and rinsing off  the CHG Soap.                9.  Pat yourself dry with a clean towel.            10.  Wear clean pajamas.            11.  Place clean sheets on your bed the night  of your first shower and do not  sleep with pets. Day of Surgery : Do not apply any lotions/deodorants the morning of surgery.  Please wear clean clothes to the hospital/surgery center.  FAILURE TO FOLLOW THESE INSTRUCTIONS MAY RESULT IN THE CANCELLATION OF YOUR SURGERY PATIENT SIGNATURE_________________________________  NURSE SIGNATURE__________________________________  ________________________________________________________________________

## 2023-01-11 ENCOUNTER — Encounter (HOSPITAL_COMMUNITY)
Admission: RE | Admit: 2023-01-11 | Discharge: 2023-01-11 | Disposition: A | Discharge status: Home / Self Care | Payer: Medicare (Managed Care) | Source: Ambulatory Visit | Attending: Urology | Admitting: Urology

## 2023-01-11 ENCOUNTER — Other Ambulatory Visit: Payer: Self-pay

## 2023-01-11 ENCOUNTER — Encounter (HOSPITAL_COMMUNITY): Payer: Self-pay

## 2023-01-11 VITALS — BP 147/100 | HR 66 | Temp 97.9°F | Resp 16 | Ht 71.0 in | Wt 255.7 lb

## 2023-01-11 DIAGNOSIS — Z01818 Encounter for other preprocedural examination: Secondary | ICD-10-CM | POA: Insufficient documentation

## 2023-01-11 DIAGNOSIS — I10 Essential (primary) hypertension: Secondary | ICD-10-CM | POA: Diagnosis not present

## 2023-01-11 DIAGNOSIS — I2510 Atherosclerotic heart disease of native coronary artery without angina pectoris: Secondary | ICD-10-CM | POA: Insufficient documentation

## 2023-01-11 DIAGNOSIS — Z8673 Personal history of transient ischemic attack (TIA), and cerebral infarction without residual deficits: Secondary | ICD-10-CM | POA: Insufficient documentation

## 2023-01-11 DIAGNOSIS — Z87891 Personal history of nicotine dependence: Secondary | ICD-10-CM | POA: Diagnosis not present

## 2023-01-11 DIAGNOSIS — Z86718 Personal history of other venous thrombosis and embolism: Secondary | ICD-10-CM | POA: Insufficient documentation

## 2023-01-11 DIAGNOSIS — G4733 Obstructive sleep apnea (adult) (pediatric): Secondary | ICD-10-CM | POA: Diagnosis not present

## 2023-01-11 DIAGNOSIS — N2889 Other specified disorders of kidney and ureter: Secondary | ICD-10-CM | POA: Diagnosis not present

## 2023-01-11 LAB — CBC
HCT: 46.7 % (ref 39.0–52.0)
Hemoglobin: 14.8 g/dL (ref 13.0–17.0)
MCH: 30.3 pg (ref 26.0–34.0)
MCHC: 31.7 g/dL (ref 30.0–36.0)
MCV: 95.5 fL (ref 80.0–100.0)
Platelets: 254 10*3/uL (ref 150–400)
RBC: 4.89 MIL/uL (ref 4.22–5.81)
RDW: 12.3 % (ref 11.5–15.5)
WBC: 8.9 10*3/uL (ref 4.0–10.5)
nRBC: 0 % (ref 0.0–0.2)

## 2023-01-11 LAB — BASIC METABOLIC PANEL
Anion gap: 10 (ref 5–15)
BUN: 20 mg/dL (ref 6–20)
CO2: 27 mmol/L (ref 22–32)
Calcium: 9.4 mg/dL (ref 8.9–10.3)
Chloride: 101 mmol/L (ref 98–111)
Creatinine, Ser: 1.49 mg/dL — ABNORMAL HIGH (ref 0.61–1.24)
GFR, Estimated: 54 mL/min — ABNORMAL LOW (ref >60)
Glucose, Bld: 115 mg/dL — ABNORMAL HIGH (ref 70–99)
Potassium: 4 mmol/L (ref 3.5–5.1)
Sodium: 138 mmol/L (ref 135–145)

## 2023-01-14 NOTE — Progress Notes (Signed)
Anesthesia Chart Review   Case: [id number] Date/Time: 01/18/23 0915   Procedure: LAPAROSCOPIC RADICAL LEFT NEPHRECTOMY (Left) - 210 MINUTES NEDDED FOR CASE   Anesthesia type: General   Pre-op diagnosis: LEFT RENAL MASS   Location: [facility] ROOM 05 / [facility]   Surgeons: Ardis Hughs, MD       DISCUSSION:62 y.o. former smoker with h/o HTN, OSA on CPAP, CAD (DES in Lcx 2011), DVT, CVA, left renal mass scheduled for above procedure 01/18/2023 with Dr. Louis Meckel.    Pt last seen by cardiology 12/31/22. Per OV note, "According to the Revised Cardiac Risk Index (RCRI), his Perioperative Risk of Major Cardiac Event is (%): 6.6. His Functional Capacity in METs is: 4.3 according to the Duke Activity Status Index (DASI).Therefore, based on ACC/AHA guidelines, patient would be at acceptable risk for the planned procedure without further cardiovascular testing.The patient was advised that if he develops new symptoms prior to surgery to contact our office to arrange for a follow-up visit, and he verbalized understanding.   Regarding ASA therapy, we recommend continuation of ASA throughout the perioperative period.  However, if the surgeon feels that cessation of ASA is required in the perioperative period, it may be stopped 5-7 days prior to surgery with a plan to resume it as soon as felt to be feasible from a surgical standpoint in the post-operative period."  Anticipate pt can proceed with planned procedure barring acute status change.   VS: BP (!) 147/100   Pulse 66   Temp 36.6 C (Oral)   Resp 16   Ht '5\' 11"'$  (1.803 m)   Wt 116 kg   SpO2 100%   BMI 35.67 kg/m   PROVIDERS: [facility], [place]  Primary Cardiologist:  Lauree Chandler, MD   LABS: Labs reviewed: Acceptable for surgery. (all labs ordered are listed, but only abnormal results are displayed)  Labs Reviewed  BASIC METABOLIC PANEL - Abnormal; Notable for the following components:      Result Value    Glucose, Bld 115 (*)    Creatinine, Ser 1.49 (*)    GFR, Estimated 54 (*)    All other components within normal limits  CBC  TYPE AND SCREEN     IMAGES:   EKG:   CV: Echo 11/10/2014 - Left ventricle: The cavity size was normal. There was moderate    concentric hypertrophy. Systolic function was vigorous. The    estimated ejection fraction was in the range of 65% to 70%. Wall    motion was normal; there were no regional wall motion    abnormalities. Features are consistent with a pseudonormal left    ventricular filling pattern, with concomitant abnormal relaxation    and increased filling pressure (grade 2 diastolic dysfunction).    Doppler parameters are consistent with elevated ventricular    end-diastolic filling pressure.  - Aortic valve: Trileaflet; normal thickness leaflets. There was no    regurgitation.  - Mitral valve: Structurally normal valve. There was no    regurgitation.  - Left atrium: The atrium was mildly dilated.  - Right ventricle: Systolic function was normal.  - Right atrium: The atrium was normal in size.  - Pulmonary arteries: Systolic pressure was within the normal    range.  - Inferior vena cava: The vessel was normal in size.  - Pericardium, extracardiac: There was no pericardial effusion.  Past Medical History:  Diagnosis Date   Acute respiratory failure ([place]) 2015   a. Following BG  hemorrhage - s/p trache with decannulation.   Basal ganglia hemorrhage ([place]) 2015   a. 2015 - with intraventricular extension and hydrocephalus requiring right crani and VP shunt, complicated by encephalopathy, protein calorie malnutrition, acute resp failure req trache and PEG, BLE DVT s/p IVC, enterobacter PNA, subsequent right hemiparesis and difficulty communicating.   BPH (benign prostatic hyperplasia)    CAD (coronary artery disease)    a. s/p promus DES circumflex artery 10/13/10. b. Cath 04/2013: stable disease but possible small vessel disease -Imdur added.    Carotid artery disease ([place])    a. 1-29% BICA by study 04/2014.   CVA (cerebral vascular accident) ([place]) 02/2010   a. CVA 2011, prior reported TIAs. b. CNS hemorrhage 2015.   CVA (cerebral vascular accident) ([place]) 06/2014   "hemorrhagic; in [place]; brain activity diminshed; ability to do anything; can't do ADLs on own"   DVT (deep venous thrombosis) ([place]) 2013   a. 2013. b. 2015 - reported BLE DVT s/p IVC filter.    Enterobacter cloacae pneumonia 2015   Falls    Folate deficiency anemia    GERD (gastroesophageal reflux disease)    Hematuria 07/2011   HLD (hyperlipidemia)    HTN (hypertension)    Hydrocephalus ([place]) 2015   a. 2/2 BG hemorrhage.   Incontinence of feces    Lower extremity edema    LV dysfunction    a. 2011 - EF 45-50%, subsequent 55-60% in 04/2014.   Noncompliance    Orthostatic hypotension    OSA on CPAP    "not using CPAP right now" (11/30/2014)   PFO (patent foramen ovale)    a. echo 8/12: EF 55-65%, grade 2 diast dysfnx; +PFO on bubble study. b. TEE 4/13: EF normal, atrial septum with suspicion for interatrial septum fenestrations without flow across and few large bubbles noted in LA.  This was not felt to require closure    Protein calorie malnutrition ([place])    Rheumatoid arthritis(714.0)    S/P percutaneous endoscopic gastrostomy (PEG) tube placement ([place]) 2015   TIA (transient ischemic attack)    "1-2; both after the one in 02/2010" (09/25/2013)   Urinary incontinence     Past Surgical History:  Procedure Laterality Date   CORONARY ANGIOPLASTY WITH STENT PLACEMENT  12/10/2010   "1" (09/24/2013)   filter in place for clots      per wife at preop on 01/11/23   KNEE ARTHROSCOPY Bilateral 08/11/1999   2 on left and 3 on rt   LEFT HEART CATHETERIZATION WITH CORONARY ANGIOGRAM N/A 04/24/2013   Procedure: LEFT HEART CATHETERIZATION WITH CORONARY ANGIOGRAM;  Surgeon: Burnell Blanks, MD;  Location: [facility] CATH LAB;  Service: Cardiovascular;  Laterality:  N/A;   shunt in head      TEE WITHOUT CARDIOVERSION  03/21/2012   Procedure: TRANSESOPHAGEAL ECHOCARDIOGRAM (TEE);  Surgeon: Fay Records, MD;  Location: [facility] ENDOSCOPY;  Service: Cardiovascular;  Laterality: N/A;    MEDICATIONS:  aspirin EC 81 MG tablet   atorvastatin (LIPITOR) 80 MG tablet   Cholecalciferol (VITAMIN D3) 25 MCG (1000 UT) CAPS   escitalopram (LEXAPRO) 20 MG tablet   famotidine (PEPCID) 20 MG tablet   hydrALAZINE (APRESOLINE) 50 MG tablet   lisinopril (PRINIVIL,ZESTRIL) 40 MG tablet   memantine (NAMENDA) 10 MG tablet   metoprolol succinate (TOPROL-XL) 100 MG 24 hr tablet   psyllium (REGULOID) 0.52 g capsule   rivastigmine (EXELON) 6 MG capsule   spironolactone (ALDACTONE) 25 MG tablet   traZODone (DESYREL) 100 MG tablet  No current facility-administered medications for this encounter.    Konrad Felix Ward, PA-C [facility]-Surgical Testing [phone]

## 2023-01-17 ENCOUNTER — Encounter (HOSPITAL_COMMUNITY): Payer: Self-pay | Admitting: Urology

## 2023-01-18 ENCOUNTER — Inpatient Hospital Stay (HOSPITAL_COMMUNITY): Payer: Medicare (Managed Care) | Admitting: Physician Assistant

## 2023-01-18 ENCOUNTER — Inpatient Hospital Stay (HOSPITAL_COMMUNITY): Payer: Medicare (Managed Care) | Admitting: Anesthesiology

## 2023-01-18 ENCOUNTER — Encounter (HOSPITAL_COMMUNITY): Payer: Self-pay | Admitting: Urology

## 2023-01-18 ENCOUNTER — Encounter (HOSPITAL_COMMUNITY)
Admission: RE | Disposition: A | Discharge status: Skilled Nursing Facility | Payer: Self-pay | Source: Home / Self Care | Attending: Urology

## 2023-01-18 ENCOUNTER — Inpatient Hospital Stay (HOSPITAL_COMMUNITY)
Admission: RE | Admit: 2023-01-18 | Discharge: 2023-01-21 | DRG: 657 | Disposition: A | Discharge status: Skilled Nursing Facility | Payer: Medicare (Managed Care) | Attending: Urology | Admitting: Urology

## 2023-01-18 ENCOUNTER — Other Ambulatory Visit: Payer: Self-pay

## 2023-01-18 DIAGNOSIS — N2889 Other specified disorders of kidney and ureter: Secondary | ICD-10-CM

## 2023-01-18 DIAGNOSIS — Z6835 Body mass index (BMI) 35.0-35.9, adult: Secondary | ICD-10-CM

## 2023-01-18 DIAGNOSIS — N281 Cyst of kidney, acquired: Secondary | ICD-10-CM | POA: Diagnosis not present

## 2023-01-18 DIAGNOSIS — C642 Malignant neoplasm of left kidney, except renal pelvis: Principal | ICD-10-CM | POA: Diagnosis present

## 2023-01-18 DIAGNOSIS — Z86718 Personal history of other venous thrombosis and embolism: Secondary | ICD-10-CM

## 2023-01-18 DIAGNOSIS — I2510 Atherosclerotic heart disease of native coronary artery without angina pectoris: Secondary | ICD-10-CM

## 2023-01-18 DIAGNOSIS — G4733 Obstructive sleep apnea (adult) (pediatric): Secondary | ICD-10-CM | POA: Diagnosis present

## 2023-01-18 DIAGNOSIS — D49512 Neoplasm of unspecified behavior of left kidney: Secondary | ICD-10-CM | POA: Diagnosis not present

## 2023-01-18 DIAGNOSIS — Z87891 Personal history of nicotine dependence: Secondary | ICD-10-CM

## 2023-01-18 DIAGNOSIS — I5032 Chronic diastolic (congestive) heart failure: Secondary | ICD-10-CM | POA: Diagnosis present

## 2023-01-18 DIAGNOSIS — I110 Hypertensive heart disease with heart failure: Secondary | ICD-10-CM | POA: Diagnosis present

## 2023-01-18 DIAGNOSIS — K219 Gastro-esophageal reflux disease without esophagitis: Secondary | ICD-10-CM | POA: Diagnosis present

## 2023-01-18 DIAGNOSIS — F32A Depression, unspecified: Secondary | ICD-10-CM | POA: Diagnosis present

## 2023-01-18 DIAGNOSIS — Q2112 Patent foramen ovale: Secondary | ICD-10-CM

## 2023-01-18 DIAGNOSIS — E669 Obesity, unspecified: Secondary | ICD-10-CM | POA: Diagnosis present

## 2023-01-18 DIAGNOSIS — R32 Unspecified urinary incontinence: Secondary | ICD-10-CM | POA: Diagnosis present

## 2023-01-18 DIAGNOSIS — I10 Essential (primary) hypertension: Secondary | ICD-10-CM

## 2023-01-18 DIAGNOSIS — I69219 Unspecified symptoms and signs involving cognitive functions following other nontraumatic intracranial hemorrhage: Secondary | ICD-10-CM

## 2023-01-18 HISTORY — PX: LAPAROSCOPIC NEPHRECTOMY: SHX1930

## 2023-01-18 LAB — TYPE AND SCREEN
ABO/RH(D): O POS
Antibody Screen: NEGATIVE

## 2023-01-18 LAB — HEMOGLOBIN AND HEMATOCRIT, BLOOD
HCT: 41.9 % (ref 39.0–52.0)
Hemoglobin: 13.3 g/dL (ref 13.0–17.0)

## 2023-01-18 SURGERY — NEPHRECTOMY, RADICAL, LAPAROSCOPIC, ADULT
Anesthesia: General | Laterality: Left

## 2023-01-18 MED ORDER — SODIUM CHLORIDE (PF) 0.9 % IJ SOLN
INTRAMUSCULAR | Status: DC | PRN
  Administered 2023-01-18: 60 mL

## 2023-01-18 MED ORDER — SUGAMMADEX SODIUM 500 MG/5ML IV SOLN
INTRAVENOUS | Status: DC | PRN
  Administered 2023-01-18: 500 mg via INTRAVENOUS

## 2023-01-18 MED ORDER — PHENYLEPHRINE 80 MCG/ML (10ML) SYRINGE FOR IV PUSH (FOR BLOOD PRESSURE SUPPORT)
PREFILLED_SYRINGE | INTRAVENOUS | Status: DC | PRN
  Administered 2023-01-18 (×5): 80 ug via INTRAVENOUS

## 2023-01-18 MED ORDER — SODIUM CHLORIDE (PF) 0.9 % IJ SOLN
INTRAMUSCULAR | Status: AC
  Filled 2023-01-18: qty 10

## 2023-01-18 MED ORDER — MEMANTINE HCL 10 MG PO TABS
10.0000 mg | ORAL_TABLET | Freq: Every day | ORAL | Status: DC
  Administered 2023-01-19 – 2023-01-21 (×3): 10 mg via ORAL
  Filled 2023-01-18 (×3): qty 1

## 2023-01-18 MED ORDER — ONDANSETRON HCL 4 MG/2ML IJ SOLN
INTRAMUSCULAR | Status: DC | PRN
  Administered 2023-01-18: 4 mg via INTRAVENOUS

## 2023-01-18 MED ORDER — OXYCODONE HCL 5 MG/5ML PO SOLN: 5.0000 mg | Freq: Once | ORAL | Status: DC | PRN

## 2023-01-18 MED ORDER — BUPIVACAINE HCL (PF) 0.5 % IJ SOLN
INTRAMUSCULAR | Status: DC | PRN
  Administered 2023-01-18: 30 mL
  Administered 2023-01-18: 19 mL

## 2023-01-18 MED ORDER — ALBUMIN HUMAN 5 % IV SOLN: INTRAVENOUS | Status: DC | PRN

## 2023-01-18 MED ORDER — BUPIVACAINE LIPOSOME 1.3 % IJ SUSP
INTRAMUSCULAR | Status: AC
  Filled 2023-01-18: qty 20

## 2023-01-18 MED ORDER — ALBUMIN HUMAN 5 % IV SOLN
INTRAVENOUS | Status: AC
  Filled 2023-01-18: qty 250

## 2023-01-18 MED ORDER — PROPOFOL 10 MG/ML IV BOLUS
INTRAVENOUS | Status: AC
  Filled 2023-01-18: qty 20

## 2023-01-18 MED ORDER — ATORVASTATIN CALCIUM 40 MG PO TABS
80.0000 mg | ORAL_TABLET | Freq: Every day | ORAL | Status: DC
  Administered 2023-01-18 – 2023-01-21 (×4): 80 mg via ORAL
  Filled 2023-01-18 (×4): qty 2
  Filled 2023-01-18: qty 1

## 2023-01-18 MED ORDER — ESCITALOPRAM OXALATE 20 MG PO TABS
20.0000 mg | ORAL_TABLET | Freq: Every day | ORAL | Status: DC
  Administered 2023-01-19 – 2023-01-21 (×3): 20 mg via ORAL
  Filled 2023-01-18 (×3): qty 1

## 2023-01-18 MED ORDER — MIDAZOLAM HCL 5 MG/5ML IJ SOLN
INTRAMUSCULAR | Status: DC | PRN
  Administered 2023-01-18 (×2): 1 mg via INTRAVENOUS

## 2023-01-18 MED ORDER — MUPIROCIN 2 % EX OINT
1.0000 | TOPICAL_OINTMENT | Freq: Once | CUTANEOUS | Status: AC
  Administered 2023-01-18: 1 via TOPICAL
  Filled 2023-01-18: qty 22

## 2023-01-18 MED ORDER — ACETAMINOPHEN 10 MG/ML IV SOLN
1000.0000 mg | Freq: Four times a day (QID) | INTRAVENOUS | Status: AC
  Administered 2023-01-18 – 2023-01-19 (×3): 1000 mg via INTRAVENOUS
  Filled 2023-01-18 (×3): qty 100

## 2023-01-18 MED ORDER — MIDAZOLAM HCL 2 MG/2ML IJ SOLN
INTRAMUSCULAR | Status: AC
  Filled 2023-01-18: qty 2

## 2023-01-18 MED ORDER — PROPOFOL 10 MG/ML IV BOLUS
INTRAVENOUS | Status: DC | PRN
  Administered 2023-01-18: 140 mg via INTRAVENOUS

## 2023-01-18 MED ORDER — HYDRALAZINE HCL 50 MG PO TABS
100.0000 mg | ORAL_TABLET | Freq: Two times a day (BID) | ORAL | Status: DC
  Administered 2023-01-18 – 2023-01-21 (×6): 100 mg via ORAL
  Filled 2023-01-18 (×6): qty 2

## 2023-01-18 MED ORDER — 0.9 % SODIUM CHLORIDE (POUR BTL) OPTIME
TOPICAL | Status: DC | PRN
  Administered 2023-01-18: 1000 mL

## 2023-01-18 MED ORDER — DEXMEDETOMIDINE HCL IN NACL 80 MCG/20ML IV SOLN
INTRAVENOUS | Status: AC
  Filled 2023-01-18: qty 20

## 2023-01-18 MED ORDER — FENTANYL CITRATE PF 50 MCG/ML IJ SOSY
PREFILLED_SYRINGE | INTRAMUSCULAR | Status: AC
  Filled 2023-01-18: qty 1

## 2023-01-18 MED ORDER — ONDANSETRON HCL 4 MG/2ML IJ SOLN: 4.0000 mg | Freq: Four times a day (QID) | INTRAMUSCULAR | Status: DC | PRN

## 2023-01-18 MED ORDER — LACTATED RINGERS IV SOLN: INTRAVENOUS | Status: DC

## 2023-01-18 MED ORDER — ROCURONIUM BROMIDE 10 MG/ML (PF) SYRINGE
PREFILLED_SYRINGE | INTRAVENOUS | Status: DC | PRN
  Administered 2023-01-18: 100 mg via INTRAVENOUS
  Administered 2023-01-18: 50 mg via INTRAVENOUS
  Administered 2023-01-18: 30 mg via INTRAVENOUS
  Administered 2023-01-18: 20 mg via INTRAVENOUS

## 2023-01-18 MED ORDER — HEMOSTATIC AGENTS (NO CHARGE) OPTIME
TOPICAL | Status: DC | PRN
  Administered 2023-01-18: 1

## 2023-01-18 MED ORDER — LACTATED RINGERS IV SOLN: INTRAVENOUS | Status: DC | PRN

## 2023-01-18 MED ORDER — DEXMEDETOMIDINE HCL IN NACL 80 MCG/20ML IV SOLN
INTRAVENOUS | Status: DC | PRN
  Administered 2023-01-18 (×2): 4 ug via BUCCAL

## 2023-01-18 MED ORDER — ROCURONIUM BROMIDE 10 MG/ML (PF) SYRINGE
PREFILLED_SYRINGE | INTRAVENOUS | Status: AC
  Filled 2023-01-18: qty 10

## 2023-01-18 MED ORDER — FAMOTIDINE 20 MG PO TABS
20.0000 mg | ORAL_TABLET | Freq: Every day | ORAL | Status: DC
  Administered 2023-01-18 – 2023-01-21 (×4): 20 mg via ORAL
  Filled 2023-01-18 (×4): qty 1

## 2023-01-18 MED ORDER — LIDOCAINE HCL (PF) 2 % IJ SOLN
INTRAMUSCULAR | Status: AC
  Filled 2023-01-18: qty 15

## 2023-01-18 MED ORDER — BUPIVACAINE LIPOSOME 1.3 % IJ SUSP
INTRAMUSCULAR | Status: DC | PRN
  Administered 2023-01-18: 20 mL

## 2023-01-18 MED ORDER — FENTANYL CITRATE PF 50 MCG/ML IJ SOSY
25.0000 ug | PREFILLED_SYRINGE | INTRAMUSCULAR | Status: DC | PRN
  Administered 2023-01-18: 50 ug via INTRAVENOUS

## 2023-01-18 MED ORDER — EPHEDRINE 5 MG/ML INJ
INTRAVENOUS | Status: AC
  Filled 2023-01-18: qty 5

## 2023-01-18 MED ORDER — FENTANYL CITRATE (PF) 100 MCG/2ML IJ SOLN
INTRAMUSCULAR | Status: DC | PRN
  Administered 2023-01-18: 100 ug via INTRAVENOUS
  Administered 2023-01-18 (×3): 50 ug via INTRAVENOUS

## 2023-01-18 MED ORDER — FENTANYL CITRATE (PF) 250 MCG/5ML IJ SOLN
INTRAMUSCULAR | Status: AC
  Filled 2023-01-18: qty 5

## 2023-01-18 MED ORDER — SUGAMMADEX SODIUM 500 MG/5ML IV SOLN
INTRAVENOUS | Status: AC
  Filled 2023-01-18: qty 5

## 2023-01-18 MED ORDER — TRAMADOL HCL 50 MG PO TABS: 50.0000 mg | ORAL_TABLET | Freq: Four times a day (QID) | ORAL | 0 refills | Status: DC | PRN

## 2023-01-18 MED ORDER — TRAMADOL HCL 50 MG PO TABS
50.0000 mg | ORAL_TABLET | Freq: Four times a day (QID) | ORAL | Status: DC | PRN
  Administered 2023-01-19: 50 mg via ORAL
  Filled 2023-01-18: qty 1

## 2023-01-18 MED ORDER — LIDOCAINE 2% (20 MG/ML) 5 ML SYRINGE
INTRAMUSCULAR | Status: DC | PRN
  Administered 2023-01-18: 100 mg via INTRAVENOUS

## 2023-01-18 MED ORDER — METOPROLOL SUCCINATE ER 100 MG PO TB24
100.0000 mg | ORAL_TABLET | Freq: Two times a day (BID) | ORAL | Status: DC
  Administered 2023-01-18 – 2023-01-21 (×6): 100 mg via ORAL
  Filled 2023-01-18 (×7): qty 1

## 2023-01-18 MED ORDER — EPHEDRINE SULFATE-NACL 50-0.9 MG/10ML-% IV SOSY
PREFILLED_SYRINGE | INTRAVENOUS | Status: DC | PRN
  Administered 2023-01-18 (×2): 10 mg via INTRAVENOUS
  Administered 2023-01-18: 5 mg via INTRAVENOUS

## 2023-01-18 MED ORDER — HYDROMORPHONE HCL 1 MG/ML IJ SOLN: 0.5000 mg | INTRAMUSCULAR | Status: DC | PRN

## 2023-01-18 MED ORDER — CEFAZOLIN SODIUM-DEXTROSE 2-4 GM/100ML-% IV SOLN
2.0000 g | INTRAVENOUS | Status: AC
  Administered 2023-01-18: 2 g via INTRAVENOUS
  Filled 2023-01-18: qty 100

## 2023-01-18 MED ORDER — ROCURONIUM BROMIDE 10 MG/ML (PF) SYRINGE
PREFILLED_SYRINGE | INTRAVENOUS | Status: AC
  Filled 2023-01-18: qty 30

## 2023-01-18 MED ORDER — PHENYLEPHRINE HCL-NACL 20-0.9 MG/250ML-% IV SOLN
INTRAVENOUS | Status: DC | PRN
  Administered 2023-01-18: 40 ug/min via INTRAVENOUS

## 2023-01-18 MED ORDER — RIVASTIGMINE TARTRATE 3 MG PO CAPS
6.0000 mg | ORAL_CAPSULE | Freq: Two times a day (BID) | ORAL | Status: DC
  Administered 2023-01-18 – 2023-01-21 (×6): 6 mg via ORAL
  Filled 2023-01-18 (×6): qty 2

## 2023-01-18 MED ORDER — OXYCODONE HCL 5 MG PO TABS: 5.0000 mg | ORAL_TABLET | Freq: Once | ORAL | Status: DC | PRN

## 2023-01-18 MED ORDER — DEXAMETHASONE SODIUM PHOSPHATE 10 MG/ML IJ SOLN
INTRAMUSCULAR | Status: DC | PRN
  Administered 2023-01-18: 10 mg via INTRAVENOUS

## 2023-01-18 MED ORDER — BUPIVACAINE HCL (PF) 0.5 % IJ SOLN
INTRAMUSCULAR | Status: AC
  Filled 2023-01-18: qty 30

## 2023-01-18 MED ORDER — SODIUM CHLORIDE (PF) 0.9 % IJ SOLN
INTRAMUSCULAR | Status: AC
  Filled 2023-01-18: qty 50

## 2023-01-18 SURGICAL SUPPLY — 68 items
ADH SKN CLS APL DERMABOND .7 (GAUZE/BANDAGES/DRESSINGS) ×1
APL ESCP 34 STRL LF DISP (HEMOSTASIS)
APL PRP STRL LF DISP 70% ISPRP (MISCELLANEOUS) ×1
APL SRG 38 LTWT LNG FL B (MISCELLANEOUS)
APPLICATOR ARISTA FLEXITIP XL (MISCELLANEOUS) IMPLANT
APPLICATOR SURGIFLO ENDO (HEMOSTASIS) IMPLANT
APPLIER CLIP ROT 10 11.4 M/L (STAPLE)
APR CLP MED LRG 11.4X10 (STAPLE)
BAG COUNTER SPONGE SURGICOUNT (BAG) ×1 IMPLANT
BAG LAPAROSCOPIC 12 15 PORT 16 (BASKET) IMPLANT
BAG RETRIEVAL 12/15 (BASKET) ×1
BAG SPEC THK2 15X12 ZIP CLS (MISCELLANEOUS) ×1
BAG SPNG CNTER NS LX DISP (BAG) ×1
BAG ZIPLOCK 12X15 (MISCELLANEOUS) ×1 IMPLANT
BLADE EXTENDED COATED 6.5IN (ELECTRODE) IMPLANT
BLADE SURG SZ10 CARB STEEL (BLADE) IMPLANT
CHLORAPREP W/TINT 26 (MISCELLANEOUS) ×1 IMPLANT
CLIP APPLIE ROT 10 11.4 M/L (STAPLE) IMPLANT
CLIP LIGATING HEM O LOK PURPLE (MISCELLANEOUS) ×1 IMPLANT
CLIP LIGATING HEMO LOK XL GOLD (MISCELLANEOUS) ×1 IMPLANT
CLIP LIGATING HEMO O LOK GREEN (MISCELLANEOUS) IMPLANT
CUTTER FLEX LINEAR 45M (STAPLE) IMPLANT
DERMABOND ADVANCED .7 DNX12 (GAUZE/BANDAGES/DRESSINGS) ×1 IMPLANT
DRAPE INCISE IOBAN 66X45 STRL (DRAPES) ×1 IMPLANT
DRAPE WARM FLUID 44X44 (DRAPES) IMPLANT
ELECT PENCIL ROCKER SW 15FT (MISCELLANEOUS) ×1 IMPLANT
ELECT REM PT RETURN 15FT ADLT (MISCELLANEOUS) ×1 IMPLANT
GLOVE SURG LX STRL 7.5 STRW (GLOVE) ×1 IMPLANT
GOWN STRL REUS W/ TWL LRG LVL3 (GOWN DISPOSABLE) ×2 IMPLANT
GOWN STRL REUS W/TWL LRG LVL3 (GOWN DISPOSABLE) ×2
HEMOSTAT ARISTA ABSORB 3G PWDR (HEMOSTASIS) IMPLANT
HEMOSTAT SURGICEL 4X8 (HEMOSTASIS) IMPLANT
IRRIG SUCT STRYKERFLOW 2 WTIP (MISCELLANEOUS) ×1
IRRIGATION SUCT STRKRFLW 2 WTP (MISCELLANEOUS) ×1 IMPLANT
KIT BASIN OR (CUSTOM PROCEDURE TRAY) ×1 IMPLANT
KIT TURNOVER KIT A (KITS) IMPLANT
MANIFOLD NEPTUNE II (INSTRUMENTS) ×1 IMPLANT
MARKER SKIN DUAL TIP RULER LAB (MISCELLANEOUS) ×1 IMPLANT
NDL INSUFFLATION 14GA 120MM (NEEDLE) IMPLANT
NDL SPNL 22GX3.5 QUINCKE BK (NEEDLE) IMPLANT
NEEDLE INSUFFLATION 14GA 120MM (NEEDLE) IMPLANT
NEEDLE SPNL 22GX3.5 QUINCKE BK (NEEDLE) IMPLANT
PAD POSITIONING PINK XL (MISCELLANEOUS) ×1 IMPLANT
PROTECTOR NERVE ULNAR (MISCELLANEOUS) ×2 IMPLANT
RELOAD 45 VASCULAR/THIN (ENDOMECHANICALS) ×4 IMPLANT
RELOAD STAPLE 45 2.5 WHT GRN (ENDOMECHANICALS) IMPLANT
RELOAD STAPLE 45 3.5 BLU ETS (ENDOMECHANICALS) IMPLANT
RELOAD STAPLE TA45 3.5 REG BLU (ENDOMECHANICALS) IMPLANT
SCISSORS LAP 5X35 DISP (ENDOMECHANICALS) IMPLANT
SET TUBE SMOKE EVAC HIGH FLOW (TUBING) ×1 IMPLANT
SHEARS HARMONIC ACE PLUS 36CM (ENDOMECHANICALS) ×1 IMPLANT
SLEEVE Z-THREAD 12X100MM (TROCAR) ×1 IMPLANT
SLEEVE Z-THREAD 5X100MM (TROCAR) ×1 IMPLANT
SPIKE FLUID TRANSFER (MISCELLANEOUS) ×1 IMPLANT
SPONGE T-LAP 4X18 ~~LOC~~+RFID (SPONGE) IMPLANT
SUT MNCRL AB 4-0 PS2 18 (SUTURE) ×2 IMPLANT
SUT PDS AB 0 CT1 36 (SUTURE) ×1 IMPLANT
SUT VIC AB 2-0 CT1 27 (SUTURE) ×1
SUT VIC AB 2-0 CT1 27XBRD (SUTURE) IMPLANT
SUT VICRYL 0 UR6 27IN ABS (SUTURE) ×1 IMPLANT
TAPE CLOTH 4X10 WHT NS (GAUZE/BANDAGES/DRESSINGS) IMPLANT
TOWEL OR 17X26 10 PK STRL BLUE (TOWEL DISPOSABLE) ×1 IMPLANT
TOWEL OR NON WOVEN STRL DISP B (DISPOSABLE) ×1 IMPLANT
TRAY FOLEY MTR SLVR 16FR STAT (SET/KITS/TRAYS/PACK) ×1 IMPLANT
TRAY LAPAROSCOPIC (CUSTOM PROCEDURE TRAY) ×1 IMPLANT
TROCAR ADV FIXATION 12X100MM (TROCAR) ×1 IMPLANT
TROCAR Z THREAD OPTICAL 12X100 (TROCAR) ×1 IMPLANT
TROCAR Z-THREAD OPTICAL 5X100M (TROCAR) ×1 IMPLANT

## 2023-01-18 NOTE — Anesthesia Procedure Notes (Signed)
Arterial Line Insertion Start/End2/08/2023 9:00 AM, 01/18/2023 9:20 AM Performed by: Lavina Hamman, CRNA, CRNA  Patient location: Pre-op. Preanesthetic checklist: patient identified, IV checked, site marked, risks and benefits discussed, surgical consent, monitors and equipment checked, pre-op evaluation, timeout performed and anesthesia consent Lidocaine 1% used for infiltration Left, radial was placed Catheter size: 20 G Hand hygiene performed  and maximum sterile barriers used  Allen's test indicative of satisfactory collateral circulation Attempts: 1 Procedure performed without using ultrasound guided technique. Following insertion, dressing applied and Biopatch. Post procedure assessment: normal and unchanged  Patient tolerated the procedure well with no immediate complications.

## 2023-01-18 NOTE — Op Note (Signed)
Preoperative diagnosis:  Left renal mass   Postoperative diagnosis:  same   Procedure: Laparoscopic left radical nephrectomy  Surgeon: Ardis Hughs, MD Resident Assistant: Hinton Rao, MD  Anesthesia: General  Complications: None  Intraoperative findings:  #1. Two arteries, one vein.  EBL: 50  Specimens:  Left kidney and proximal ureter  Indication: Tony Davis. is a 62 y.o. patient with two renal masses in his left kidney.  After reviewing the management options for treatment, he elected to proceed with the above surgical procedure(s). We have discussed the potential benefits and risks of the procedure, side effects of the proposed treatment, the likelihood of the patient achieving the goals of the procedure, and any potential problems that might occur during the procedure or recuperation. Informed consent has been obtained.  Description of procedure:  A site was selected lateral to the umbilicus for placement of the camera port. This was placed using a standard open Hassan technique which allowed entry into the peritoneal cavity under direct vision and without difficulty. A 12 mm Hassan cannula was placed and a pneumoperitoneum established. The camera was then used to inspect the abdomen and there was no evidence of any intra-abdominal injuries or other abnormalities. The remaining abdominal ports were then placed. One 5 mm trocar was placed subcostal margin in the left upper quadrant, the second 5 mm trocar was placed laterally to the camera port so as to triangulate the kidney. An assistant port was then placed in between the camera and the left lateral port. The assistant port was a 12 mm port.   The white line of [place] was incised allowing the colon to be mobilized medially and the plane between the mesocolon and the anterior layer of Gerota's fascia to be developed and the kidney exposed. The ureter and gonadal vein were identified inferiorly and the ureter was  lifted anteriorly off the psoas muscle. The gonadal vein was then dissected out inferior to the lower pole and 2 clips were placed both superiorly and inferiorly and then ligated. A second 5 mm port was placed in the left lower quadrant to help facilitate lifting up of the kidney. Dissection proceeded superiorly along the gonadal vein until the renal vein was identified. The renal hilum was then carefully isolated with a combination of blunt and sharp dissectiong allowing the renal arterial and venous structures to be separated and isolated.   The renal artery was isolated and ligated with a 45 mm Flex ETS stapler. The renal vein was then isolated and also ligated and divided with a 45 mm Flex ETS stapler.  Gerota's fascia was intentionally entered superiorly and the space between the adrenal gland and the kidney was developed allowing the adrenal gland to be spared. A third staple load was then used which divided the adrenal gland. once the hilum had been ligated dissection ensued from the inferior pole of the kidney. The ureter was transected placing 2 clips on the stay side and one on the specimen side. The lateral attachments of the kidney were then freed. Our attention was then turned to the upper pole which was dissected off of the spleen and the splenic attachments. The pancreas and colon were noted to be well away from the structures. The remaining of the posterior aspect of the attachments was then ligated using a Harmonic Scalpel. Once the kidney was freed from its attachments it was shown to medially and the vascular hilum was inspected and noted to be sufficiently hemostatic. There was slight  bit of oozing from the adrenal gland which was cauterized and then Surgicel placed over top. Insufflation was then turned down to 8 mm mercury and the kidney bed was noted to be hemostatic.   80cc of Exparel was then injected into the left anterior axillary line b/w the iliac crest and the twelfth rib under  laparoscopic guidance. The layer between the tranversus abdominus and the internal oblique was targeted.   The kidney/ureter specimen was then placed into a 12 mm Endocatch II retrieval bag, this was passed through the port site of the assistant port and left lower quadrant. The trochars were then removed under visual guidance to ensure no ongoing port site bleeding was occurring. The extraction incision was extended from the 12 mm left lower quadrant port. The external oblique and internal oblique muscles were spread as best as possible with as little muscle fibers ligated as possible in order to safely extracted the specimen. The internal oblique flash of was then closed with 2-0 Vicryl in an interrupted figure-of-eight fashion. The external oblique fascia was closed with a 0 looped PDS. The camera port was then closed with 2-0 Vicryl the level of the fascia. All incisions were injected with Exparel and reapproximated at the skin with 4-0 monocryl sutures. Dermabond was applied to the skin. The patient tolerated the procedure well and without complications and was transferred to the recovery unit in satisfactory condition.

## 2023-01-18 NOTE — TOC Initial Note (Signed)
Transition of Care (TOC) - Initial/Assessment Note    Patient Details  Name: Tony Davis. MRN: [id number] Date of Birth: 05/26/1961  Transition of Care [facility]) CM/SW Contact:    Joaquin Courts, RN Phone Number: 01/18/2023, 4:27 PM  Clinical Narrative:                 CM received notification from provider that patient's spouse expressed that [facility] in [facility] post hospitalization.  Spouse does report that if patient is medically stable for discharge over the weekend then she would take him home.  CM followed up with [facility] CSW who reports she has also spoken with spouse who has now elected for patient to return home at discharge rather than go to SNF if medically stable over weekend.  [facility] reports that they have staff available over weekend to follow up with patient in the home, report they have own [facility] services in place and no additional set-up or orders are needed at this time.  Plan at this time will be for patient to dc home with spouse.  Expected Discharge Plan: Home/Self Care Barriers to Discharge: Continued Medical Work up   Patient Goals and CMS Choice Patient states their goals for this hospitalization and ongoing recovery are:: to go home          Expected Discharge Plan and Services   Discharge Planning Services: CM Consult   Living arrangements for the past 2 months: Single Family Home                                      Prior Living Arrangements/Services Living arrangements for the past 2 months: Single Family Home Lives with:: Spouse Patient language and need for interpreter reviewed:: Yes Do you feel safe going back to the place where you live?: Yes      Need for Family Participation in Patient Care: Yes (Comment) Care giver support system in place?: Yes (comment)   Criminal Activity/Legal Involvement Pertinent to Current Situation/Hospitalization: No - Comment as needed  Activities of Daily Living    ADL Screening (condition at time of admission) Patient's cognitive ability adequate to safely complete daily activities?: Yes Is the patient deaf or have difficulty hearing?: No Does the patient have difficulty seeing, even when wearing glasses/contacts?: No Does the patient have difficulty concentrating, remembering, or making decisions?: Yes Patient able to express need for assistance with ADLs?: Yes Does the patient have difficulty dressing or bathing?: Yes Independently performs ADLs?: No Communication: Independent Dressing (OT): Independent Grooming: Independent Feeding: Needs assistance Is this a change from baseline?: Pre-admission baseline Bathing: Needs assistance Is this a change from baseline?: Pre-admission baseline Toileting: Independent In/Out Bed: Independent Walks in Home: Independent with device (comment) Does the patient have difficulty walking or climbing stairs?: Yes Weakness of Legs: Both Weakness of Arms/Hands: Both  Permission Sought/Granted                  Emotional Assessment Appearance:: Appears stated age         Psych Involvement: No (comment)  Admission diagnosis:  Left renal mass [N28.89] Patient Active Problem List   Diagnosis Date Noted   Left renal mass 01/18/2023   Obesity (BMI 30-39.9) 04/21/2019   Altered mental status 09/20/2018   Hypokalemia 12/26/2015   Depression [id number]   Diastolic dysfunction with chronic heart failure ([place]) 11/30/2014  Essential hypertension 11/03/2014   LV dysfunction    Hydrocephalus (HCC)    Basal ganglia hemorrhage ([place]) 07/30/2014   Routine general medical examination at a health care facility 02/11/2014   Insomnia, persistent 03/25/2013   Chronic rhinitis 03/25/2013   Hemiparesis due to old cerebral infarction ([place]) 03/20/2013   Folate deficiency anemia 10/13/2012   Long term (current) use of anticoagulants 03/28/2012   Obstructive sleep apnea 08/16/2011   Prediabetes 08/06/2011    PATENT FORAMEN OVALE 03/13/2010   CAD, NATIVE VESSEL 01/26/2010   Hyperlipidemia with target LDL less than 100 01/23/2010   GERD 01/23/2010   PCP:  [facility] [place]:   [facility] [id number] - [place], [place] - [address] [place] [address] Phone: [phone] Fax: [phone]  [id number] [place] [address]: [phone]: [phone] [id number] [place] [address]: [phone]: [phone]    Social Determinants of Health (SDOH) Social History: SDOH Screenings   Tobacco Use: Medium Risk (01/18/2023)   SDOH Interventions:     Readmission Risk Interventions     No data to display

## 2023-01-18 NOTE — H&P (Signed)
62 year old male with a history of hemorrhagic stroke with significant cognitive deficits and some mild mobility issues presents today with an MRI demonstrating 2 concerning areas in the left kidney. One of them in the lower interpolar central region is 2.8 cm and is a complex cyst with concern for cystic renal cell carcinoma. The second 1 is a 2.3 cm lesion in the upper pole posterior region that is a solid enhancing lesion also concerning for renal cell. It appears that both of these areas do have some enhancement.   The patient in 2015 had a hemorrhagic stroke. He had subsequent embolic strokes. He now has a filter. He is not taking any anticoagulation. He has a history of high blood pressure otherwise. Again he has some cognition deficits. The interview today was mainly done through his wife and his primary caregiver. The patient is a patient of [facility].   He has no history of heart attack, and he is nondiabetic.   01/01/2023: Patient here today for preoperative appointment accompanied by his wife. He is scheduled for laparoscopic radical left nephrectomy with Dr. Louis Meckel on 2/9. Due to patient's cognitive deficits, most of the interview conducted with patient's wife. Patient able to answer simple questioning and follow commands at baseline. Currently without complaint. At baseline patient is incontinent of urine which is managed with frequent diaper changes. He is not having any notable pain or discomfort with voiding or gross hematuria. He and wife both deny any complaints of unilateral lower back or flank pain/discomfort suggestive of obstructive uropathy. He has had no changes in past medical history, prescription medications taken on a daily basis. No interval surgical or procedural intervention since last seen. He has had no recent fever/chills, nausea/vomiting or infection treatment. He denies complaints of chest pain or shortness of breath.     ALLERGIES: Iodine    MEDICATIONS:  Aspirin 81 mg tablet,chewable  Metoprolol Succinate  Amlodipine Besylate  Atorvastatin Calcium 80 mg tablet  Escitalopram Oxalate  Famotidine 20 mg tablet  Hydralazine Hcl 50 mg tablet  Psyllium Fiber  Rivastigmine  Spironolactone 25 mg tablet  Tylenol  Vitamin D3     GU PSH: No GU PSH    NON-GU PSH: No Non-GU PSH    GU PMH: Left renal neoplasm - 11/20/2022      PMH Notes:  1898-12-10 00:00:00 - Note: Normal Routine History And Physical Adult   NON-GU PMH: Atrial Fibrillation DVT, History Hypercholesterolemia Hypertension Sleep Apnea Stroke/TIA    FAMILY HISTORY: No Family History    SOCIAL HISTORY: Marital Status: Married Preferred Language: English; Ethnicity: Not Hispanic Or Latino; Race: Black or African American Current Smoking Status: Patient does not smoke anymore. Has not smoked since 11/09/2009.   Tobacco Use Assessment Completed: Used Tobacco in last 30 days? Has never drank.  Does not drink caffeine.    REVIEW OF SYSTEMS:    GU Review Male:   Patient denies frequent urination, hard to postpone urination, burning/ pain with urination, get up at night to urinate, leakage of urine, stream starts and stops, trouble starting your stream, have to strain to urinate , erection problems, and penile pain.  Gastrointestinal (Upper):   Patient denies nausea, vomiting, and indigestion/ heartburn.  Gastrointestinal (Lower):   Patient denies diarrhea and constipation.  Constitutional:   Patient denies fever, night sweats, weight loss, and fatigue.  Skin:   Patient denies skin rash/ lesion and itching.  Eyes:   Patient denies blurred vision and double vision.  Ears/  Nose/ Throat:   Patient denies sore throat and sinus problems.  Hematologic/Lymphatic:   Patient denies swollen glands and easy bruising.  Cardiovascular:   Patient denies leg swelling and chest pains.  Respiratory:   Patient denies cough and shortness of breath.  Endocrine:   Patient denies excessive  thirst.  Musculoskeletal:   Patient denies back pain and joint pain.  Neurological:   Patient denies headaches and dizziness.  Psychologic:   Patient denies depression and anxiety.   VITAL SIGNS:      01/01/2023 01:58 PM  Weight 250 lb / 113.4 kg  Height 72 in / 182.88 cm  BP 166/105 mmHg  Heart Rate 64 /min  Temperature 97.8 F / 36.5 C  BMI 33.9 kg/m   MULTI-SYSTEM PHYSICAL EXAMINATION:    Constitutional: Well-nourished. No physical deformities. Normally developed. Good grooming. Stigmata from prior CVA present. He ambulates well with use of rolling walker. Most of HPI questioning provided by patient's wife  Neck: Neck symmetrical, not swollen. Normal tracheal position.  Respiratory: No labored breathing, no use of accessory muscles.   Cardiovascular: Normal temperature, normal extremity pulses, no swelling, no varicosities.  Skin: No paleness, no jaundice, no cyanosis. No lesion, no ulcer, no rash.  Neurologic / Psychiatric: Oriented to time, oriented to place, oriented to person. No depression, no anxiety, no agitation.  Gastrointestinal: Obese abdomen. No mass, no tenderness, no rigidity.   Musculoskeletal: Normal gait and station of head and neck.     Complexity of Data:  Source Of History:  Patient, Family/Caregiver, Medical Record Summary  Records Review:   Previous Doctor Records, Previous Hospital Records, Previous Patient Records  Urine Test Review:   Urinalysis  X-Ray Review: MRI Abdomen: Reviewed Films. Reviewed Report.     01/01/23  Urinalysis  Urine Appearance Cloudy   Urine Color Yellow   Urine Glucose Neg mg/dL  Urine Bilirubin Neg mg/dL  Urine Ketones Neg mg/dL  Urine Specific Gravity 1.025   Urine Blood Neg ery/uL  Urine pH 6.0   Urine Protein Trace mg/dL  Urine Urobilinogen 0.2 mg/dL  Urine Nitrites Neg   Urine Leukocyte Esterase 3+ leu/uL  Urine WBC/hpf 40 - 60/hpf   Urine RBC/hpf 0 - 2/hpf   Urine Epithelial Cells 0 - 5/hpf   Urine Bacteria Many  (>50/hpf)   Urine Mucous Not Present   Urine Yeast NS (Not Seen)   Urine Trichomonas Not Present   Urine Cystals NS (Not Seen)   Urine Casts NS (Not Seen)   Urine Sperm Not Present    PROCEDURES:          Urinalysis w/Scope Dipstick Dipstick Cont'd Micro  Color: Yellow Bilirubin: Neg mg/dL WBC/hpf: 40 - 60/hpf  Appearance: Cloudy Ketones: Neg mg/dL RBC/hpf: 0 - 2/hpf  Specific Gravity: 1.025 Blood: Neg ery/uL Bacteria: Many (>50/hpf)  pH: 6.0 Protein: Trace mg/dL Cystals: NS (Not Seen)  Glucose: Neg mg/dL Urobilinogen: 0.2 mg/dL Casts: NS (Not Seen)    Nitrites: Neg Trichomonas: Not Present    Leukocyte Esterase: 3+ leu/uL Mucous: Not Present      Epithelial Cells: 0 - 5/hpf      Yeast: NS (Not Seen)      Sperm: Not Present    ASSESSMENT:      ICD-10 Details  1 GU:   Left renal neoplasm - D49.512 Chronic, Threat to Bodily Function  2 NON-GU:   Encounter for other preprocedural examination - Z01.818 Undiagnosed New Problem   PLAN:  Orders Labs Urine Culture          Schedule Return Visit/Planned Activity: Keep Scheduled Appointment - Follow up MD, Schedule Surgery          Document Letter(s):  Created for Patient: Clinical Summary         Notes:   All questions answered to the best of my ability regarding the upcoming procedure and expected postoperative course with understanding expressed by both the patient and his wife who is his primary caregiver. Although patient is grossly incontinent of urine, he and his wife will attempt to give a voided urine specimen prior to checking out so that a baseline culture/sensitivity can be sent. Moving forward he will proceed with previously scheduled laparoscopic radical left nephrectomy on 02/09 with Dr. Louis Meckel.

## 2023-01-18 NOTE — Anesthesia Procedure Notes (Signed)
Procedure Name: Intubation Date/Time: 01/18/2023 10:58 AM  Performed by: Lavina Hamman, CRNAPre-anesthesia Checklist: Patient identified, Emergency Drugs available, Suction available, Patient being monitored and Timeout performed Patient Re-evaluated:Patient Re-evaluated prior to induction Oxygen Delivery Method: Circle system utilized Preoxygenation: Pre-oxygenation with 100% oxygen Induction Type: IV induction Ventilation: Mask ventilation without difficulty Laryngoscope Size: Mac and 4 Grade View: Grade II Tube type: Oral Tube size: 7.5 mm Number of attempts: 1 Airway Equipment and Method: Stylet Placement Confirmation: ETT inserted through vocal cords under direct vision, positive ETCO2, CO2 detector and breath sounds checked- equal and bilateral Secured at: 23 cm Tube secured with: Tape Dental Injury: Teeth and Oropharynx as per pre-operative assessment

## 2023-01-18 NOTE — Anesthesia Postprocedure Evaluation (Signed)
Anesthesia Post Note  Patient: Tony Davis.  Procedure(s) Performed: LAPAROSCOPIC RADICAL LEFT NEPHRECTOMY (Left)     Patient location during evaluation: PACU Anesthesia Type: General Level of consciousness: awake and alert Pain management: pain level controlled Vital Signs Assessment: post-procedure vital signs reviewed and stable Respiratory status: spontaneous breathing, nonlabored ventilation, respiratory function stable and patient connected to nasal cannula oxygen Cardiovascular status: blood pressure returned to baseline and stable Postop Assessment: no apparent nausea or vomiting Anesthetic complications: no   No notable events documented.  Last Vitals:  Vitals:   01/18/23 0845 01/18/23 1333  BP: (!) 170/106 (!) 143/85  Pulse: 63 80  Resp: 18 14  Temp: 36.5 C   SpO2: 99% 100%    Last Pain:  Vitals:   01/18/23 0845  TempSrc: Oral                 Chozen Latulippe S

## 2023-01-18 NOTE — Interval H&P Note (Signed)
History and Physical Interval Note:  01/18/2023 8:49 AM  Tony Davis.  has presented today for surgery, with the diagnosis of LEFT RENAL MASS.  The various methods of treatment have been discussed with the patient and family. After consideration of risks, benefits and other options for treatment, the patient has consented to  Procedure(s) with comments: LAPAROSCOPIC RADICAL LEFT NEPHRECTOMY (Left) - 210 MINUTES NEDDED FOR CASE as a surgical intervention.  The patient's history has been reviewed, patient examined, no change in status, stable for surgery.  I have reviewed the patient's chart and labs.  Questions were answered to the patient's satisfaction.     Ardis Hughs

## 2023-01-18 NOTE — Anesthesia Preprocedure Evaluation (Signed)
Anesthesia Evaluation  Patient identified by MRN, date of birth, ID band Patient awake    Reviewed: Allergy & Precautions, H&P , NPO status , Patient's Chart, lab work & pertinent test results  Airway Mallampati: II   Neck ROM: full    Dental   Pulmonary sleep apnea , former smoker   breath sounds clear to auscultation       Cardiovascular hypertension, + CAD and + Cardiac Stents   Rhythm:regular Rate:Normal  PFO   Neuro/Psych  PSYCHIATRIC DISORDERS  Depression    CVA    GI/Hepatic ,GERD  ,,  Endo/Other    Renal/GU      Musculoskeletal   Abdominal   Peds  Hematology   Anesthesia Other Findings   Reproductive/Obstetrics                             Anesthesia Physical Anesthesia Plan  ASA: 3  Anesthesia Plan: General   Post-op Pain Management:    Induction: Intravenous  PONV Risk Score and Plan: 2 and Ondansetron, Dexamethasone, Midazolam and Treatment may vary due to age or medical condition  Airway Management Planned: Oral ETT  Additional Equipment: Arterial line  Intra-op Plan:   Post-operative Plan: Extubation in OR  Informed Consent: I have reviewed the patients History and Physical, chart, labs and discussed the procedure including the risks, benefits and alternatives for the proposed anesthesia with the patient or authorized representative who has indicated his/her understanding and acceptance.     Dental advisory given  Plan Discussed with: CRNA, Anesthesiologist and Surgeon  Anesthesia Plan Comments:        Anesthesia Quick Evaluation

## 2023-01-18 NOTE — Transfer of Care (Signed)
Immediate Anesthesia Transfer of Care Note  Patient: Tony Davis.  Procedure(s) Performed: Procedure(s) with comments: LAPAROSCOPIC RADICAL LEFT NEPHRECTOMY (Left) - 210 MINUTES NEDDED FOR CASE  Patient Location: PACU  Anesthesia Type:General  Level of Consciousness:  sedated, patient cooperative and responds to stimulation  Airway & Oxygen Therapy:Patient Spontanous Breathing and Patient connected to face mask oxgen  Post-op Assessment:  Report given to PACU RN and Post -op Vital signs reviewed and stable  Post vital signs:  Reviewed and stable  Last Vitals:  Vitals:   01/18/23 0845  BP: (!) 170/106  Pulse: 63  Resp: 18  Temp: 36.5 C  SpO2: [id number]    Complications: No apparent anesthesia complications

## 2023-01-18 NOTE — Discharge Instructions (Signed)
  Driving:  It is against the law to drive when taking narcotic pain medications.  You should wait at least 8 hours after taking your last pain pill before driving.  Further, you should not drive if you are to sore to react quickly or if you have something impeding your ability to drive.   Activity:  You are encouraged to ambulate frequently (about every hour during waking hours) to help prevent blood clots from forming in your legs or lungs.  However, you should not engage in any heavy lifting (> 10-15 lbs), strenuous activity, or straining.  Diet: You should advance your diet as instructed by your physician.  It will be normal to have some bloating, nausea, and abdominal discomfort intermittently.  Prescriptions:  You will be provided a prescription for pain medication to take as needed.  If your pain is not severe enough to require the prescription pain medication, you may take extra strength Tylenol instead which will have less side effects.  You should also take a prescribed stool softener to avoid straining with bowel movements as the prescription pain medication may constipate you.  Incisions: You may remove your dressing bandages 48 hours after surgery if not removed in the hospital.  You will either have some small staples or special tissue glue at each of the incision sites. Once the bandages are removed (if present), the incisions may stay open to air.  You may start showering (but not soaking or bathing in water) the 2nd day after surgery and the incisions simply need to be patted dry after the shower.  No additional care is needed.  What to call us about: You should call the office ([phone]) if you develop fever > 101 or develop persistent vomiting. Activity:  You are encouraged to ambulate frequently (about every hour during waking hours) to help prevent blood clots from forming in your legs or lungs.  However, you should not engage in any heavy lifting (> 10-15 lbs), strenuous activity,  or straining.     

## 2023-01-19 ENCOUNTER — Encounter (HOSPITAL_COMMUNITY): Payer: Self-pay | Admitting: Urology

## 2023-01-19 ENCOUNTER — Other Ambulatory Visit: Payer: Self-pay

## 2023-01-19 DIAGNOSIS — Z6835 Body mass index (BMI) 35.0-35.9, adult: Secondary | ICD-10-CM | POA: Diagnosis not present

## 2023-01-19 DIAGNOSIS — I69219 Unspecified symptoms and signs involving cognitive functions following other nontraumatic intracranial hemorrhage: Secondary | ICD-10-CM | POA: Diagnosis not present

## 2023-01-19 DIAGNOSIS — Z87891 Personal history of nicotine dependence: Secondary | ICD-10-CM | POA: Diagnosis not present

## 2023-01-19 DIAGNOSIS — I110 Hypertensive heart disease with heart failure: Secondary | ICD-10-CM | POA: Diagnosis present

## 2023-01-19 DIAGNOSIS — Q2112 Patent foramen ovale: Secondary | ICD-10-CM | POA: Diagnosis not present

## 2023-01-19 DIAGNOSIS — Z86718 Personal history of other venous thrombosis and embolism: Secondary | ICD-10-CM | POA: Diagnosis not present

## 2023-01-19 DIAGNOSIS — R32 Unspecified urinary incontinence: Secondary | ICD-10-CM | POA: Diagnosis present

## 2023-01-19 DIAGNOSIS — N2889 Other specified disorders of kidney and ureter: Secondary | ICD-10-CM | POA: Diagnosis present

## 2023-01-19 DIAGNOSIS — I5032 Chronic diastolic (congestive) heart failure: Secondary | ICD-10-CM | POA: Diagnosis present

## 2023-01-19 DIAGNOSIS — F32A Depression, unspecified: Secondary | ICD-10-CM | POA: Diagnosis present

## 2023-01-19 DIAGNOSIS — I2510 Atherosclerotic heart disease of native coronary artery without angina pectoris: Secondary | ICD-10-CM | POA: Diagnosis present

## 2023-01-19 DIAGNOSIS — K219 Gastro-esophageal reflux disease without esophagitis: Secondary | ICD-10-CM | POA: Diagnosis present

## 2023-01-19 DIAGNOSIS — G4733 Obstructive sleep apnea (adult) (pediatric): Secondary | ICD-10-CM | POA: Diagnosis present

## 2023-01-19 DIAGNOSIS — E669 Obesity, unspecified: Secondary | ICD-10-CM | POA: Diagnosis present

## 2023-01-19 DIAGNOSIS — C642 Malignant neoplasm of left kidney, except renal pelvis: Secondary | ICD-10-CM | POA: Diagnosis present

## 2023-01-19 LAB — BASIC METABOLIC PANEL
Anion gap: 12 (ref 5–15)
BUN: 24 mg/dL — ABNORMAL HIGH (ref 6–20)
CO2: 23 mmol/L (ref 22–32)
Calcium: 9 mg/dL (ref 8.9–10.3)
Chloride: 101 mmol/L (ref 98–111)
Creatinine, Ser: 1.86 mg/dL — ABNORMAL HIGH (ref 0.61–1.24)
GFR, Estimated: 41 mL/min — ABNORMAL LOW (ref >60)
Glucose, Bld: 156 mg/dL — ABNORMAL HIGH (ref 70–99)
Potassium: 4.6 mmol/L (ref 3.5–5.1)
Sodium: 136 mmol/L (ref 135–145)

## 2023-01-19 LAB — CBC
HCT: 40.1 % (ref 39.0–52.0)
Hemoglobin: 13 g/dL (ref 13.0–17.0)
MCH: 30.6 pg (ref 26.0–34.0)
MCHC: 32.4 g/dL (ref 30.0–36.0)
MCV: 94.4 fL (ref 80.0–100.0)
Platelets: 209 10*3/uL (ref 150–400)
RBC: 4.25 MIL/uL (ref 4.22–5.81)
RDW: 12.4 % (ref 11.5–15.5)
WBC: 13.9 10*3/uL — ABNORMAL HIGH (ref 4.0–10.5)
nRBC: 0 % (ref 0.0–0.2)
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
## 2023-01-19 NOTE — Progress Notes (Signed)
1 Day Post-Op Subjective: Patient having expected discomfort at incision site.  No other complaints.  Creatinine 1.86  Objective: Vital signs in last 24 hours: Temp:  [97.6 F (36.4 C)-98.1 F (36.7 C)] 97.8 F (36.6 C) (02/10 0421) Pulse Rate:  [60-80] 65 (02/10 0421) Resp:  [8-20] 20 (02/10 0421) BP: (134-170)/(83-106) 134/83 (02/10 0421) SpO2:  [94 %-100 %] 98 % (02/10 0421)  Intake/Output from previous day: 02/09 0701 - 02/10 0700 In: 3281.3 [I.V.:2831.3; IV Piggyback:450] Out: 550 [Urine:450; Blood:100] Intake/Output this shift: No intake/output data recorded.  Physical Exam:  General: Alert and oriented  Abdomen: Soft, ND, scant bowel sounds Incisions: Clean and dry Ext: NT, No erythema  Lab Results: Recent Labs    01/18/23 1408  HGB 13.3  HCT 41.9   BMET Recent Labs    01/19/23 0529  NA 136  K 4.6  CL 101  CO2 23  GLUCOSE 156*  BUN 24*  CREATININE 1.86*  CALCIUM 9.0     Studies/Results: No results found.  Assessment/Plan: 1.  Status post left radical nephrectomy, doing well Plan/recommendation advanced liquid diet today, ambulate    LOS: 1 day   Remi Haggard 01/19/2023, 8:23 AM

## 2023-01-19 NOTE — Progress Notes (Signed)
Foley removed per order, pt tolerance well.

## 2023-01-19 NOTE — Evaluation (Signed)
Occupational Therapy Evaluation Patient Details Name: Tony Davis. MRN: [id number] DOB: Jan 01, 1961 Today's Date: 01/19/2023   History of Present Illness 62 y.o. male admitted 01/18/23 with renal mass, s/p laparoscopic radical nephrectomy 01/18/23. PMH: hemorrhagic stroke 2015, HTN   Clinical Impression   Pt. Has decreased I with ADL and states that his wife can assist. Pt. May benefit from AE training to increase I with LE ADLs. Acute ot to follow.      Recommendations for follow up therapy are one component of a multi-disciplinary discharge planning process, led by the attending physician.  Recommendations may be updated based on patient status, additional functional criteria and insurance authorization.   Follow Up Recommendations  No OT follow up     Assistance Recommended at Discharge Intermittent Supervision/Assistance  Patient can return home with the following A little help with bathing/dressing/bathroom;Assistance with cooking/housework    Functional Status Assessment  Patient has had a recent decline in their functional status and demonstrates the ability to make significant improvements in function in a reasonable and predictable amount of time.  Equipment Recommendations  None recommended by OT    Recommendations for Other Services       Precautions / Restrictions Precautions Precautions: Other (comment) Precaution Comments: abdominal surgery Restrictions Weight Bearing Restrictions: No      Mobility Bed Mobility                    Transfers       Sit to Stand: Min guard           General transfer comment: cues for proper hand placement      Balance                                           ADL either performed or assessed with clinical judgement   ADL Overall ADL's : Needs assistance/impaired Eating/Feeding: Modified independent   Grooming: Wash/dry face;Wash/dry hands;Supervision/safety;Standing   Upper Body  Bathing: Sitting;Supervision/ safety   Lower Body Bathing: Minimal assistance;Sit to/from stand   Upper Body Dressing : Set up;Sitting   Lower Body Dressing: Moderate assistance   Toilet Transfer: Min guard;Ambulation   Toileting- Clothing Manipulation and Hygiene: Min guard       Functional mobility during ADLs: Min guard General ADL Comments: Pt. states his wife helped him at home.     Vision Baseline Vision/History:  (Pt. keeps l eye closed and states it hurts when he opens it because of the light. Pt. reports he can see out of it but pt. deferred further testing,) Ability to See in Adequate Light: 0 Adequate Patient Visual Report: No change from baseline       Perception     Praxis      Pertinent Vitals/Pain Pain Assessment Pain Assessment: 0-10 Pain Score: 6  Pain Location: r lower leg Pain Descriptors / Indicators: Aching Pain Intervention(s): Premedicated before session     Hand Dominance Right   Extremity/Trunk Assessment Upper Extremity Assessment Upper Extremity Assessment: Generalized weakness   Lower Extremity Assessment Lower Extremity Assessment: Overall WFL for tasks assessed;LLE deficits/detail LLE Deficits / Details: pt reports h/o mild LLE weakness 2* CVA 2015, ankle DF +3/5, knee ext +4/5 LLE Sensation: decreased light touch   Cervical / Trunk Assessment Cervical / Trunk Assessment: Normal   Communication Communication Communication: No difficulties   Cognition Arousal/Alertness: Awake/alert Behavior During  Therapy: WFL for tasks assessed/performed Overall Cognitive Status: No family/caregiver present to determine baseline cognitive functioning                                 General Comments: Pt. is able to follow directions. Pt. is not oriented to month or year.     General Comments       Exercises     Shoulder Instructions      Home Living Family/patient expects to be discharged to:: Private residence Living  Arrangements: Spouse/significant other Available Help at Discharge: Family;Available PRN/intermittently   Home Access: Stairs to enter     Home Layout: Two level Alternate Level Stairs-Number of Steps: flight   Bathroom Shower/Tub: Occupational psychologist: Standard     Home Equipment: Conservation officer, nature (2 wheels);Cane - single point;Shower seat;Grab bars - tub/shower;Hand held shower head;BSC/3in1          Prior Functioning/Environment Prior Level of Function : Independent/Modified Independent;Driving             Mobility Comments: sometimes uses cane, denies falls in past 6 months, drives, hunts ADLs Comments: sometimes needs a little help bathing/dressing        OT Problem List: Decreased activity tolerance;Decreased knowledge of use of DME or AE      OT Treatment/Interventions: Self-care/ADL training;Energy conservation;DME and/or AE instruction;Therapeutic activities;Patient/family education    OT Goals(Current goals can be found in the care plan section) Acute Rehab OT Goals Patient Stated Goal: go home OT Goal Formulation: With patient Time For Goal Achievement: 02/02/23 Potential to Achieve Goals: Good ADL Goals Pt Will Perform Grooming: with modified independence;standing Pt Will Perform Upper Body Bathing: with modified independence;sitting Pt Will Perform Lower Body Bathing: sit to/from stand;with adaptive equipment;with supervision Pt Will Perform Upper Body Dressing: with modified independence;sitting Pt Will Perform Lower Body Dressing: with min assist;sit to/from stand Pt Will Transfer to Toilet: with modified independence;ambulating Pt Will Perform Toileting - Clothing Manipulation and hygiene: with mod assist;sit to/from stand  OT Frequency: Min 2X/week    Co-evaluation              AM-PAC OT "6 Clicks" Daily Activity     Outcome Measure Help from another person eating meals?: None Help from another person taking care of personal  grooming?: A Little Help from another person toileting, which includes using toliet, bedpan, or urinal?: A Little Help from another person bathing (including washing, rinsing, drying)?: A Little Help from another person to put on and taking off regular upper body clothing?: A Little Help from another person to put on and taking off regular lower body clothing?: A Lot 6 Click Score: 18   End of Session Nurse Communication:  (ok therapy)  Activity Tolerance: Patient tolerated treatment well Patient left: in chair;with call bell/phone within reach  OT Visit Diagnosis: Unsteadiness on feet (R26.81)                Time: [id number] OT Time Calculation (min): 34 min Charges:  OT General Charges $OT Visit: 1 Visit OT Evaluation $OT Eval Moderate Complexity: 1 Mod  Chee Dimon OT/L   Jai Steil 01/19/2023, 11:54 AM

## 2023-01-19 NOTE — Evaluation (Deleted)
Occupational Therapy Evaluation Patient Details Name: Tony Davis. MRN: [id number] DOB: 1961/06/19 Today's Date: 01/19/2023   History of Present Illness 62 y.o. male admitted 01/18/23 with renal mass, s/p laparoscopic radical nephrectomy 01/18/23. PMH: hemorrhagic stroke 2015, HTN   Clinical Impression   Pt. Is able to follow directions but does have cognitive deficits. Pt. States wife is able to assist with ADLs. Pt. May benefit from AE training to increase I. Acute OT to follow.      Recommendations for follow up therapy are one component of a multi-disciplinary discharge planning process, led by the attending physician.  Recommendations may be updated based on patient status, additional functional criteria and insurance authorization.   Follow Up Recommendations  No OT follow up     Assistance Recommended at Discharge Intermittent Supervision/Assistance  Patient can return home with the following A little help with bathing/dressing/bathroom;Assistance with cooking/housework    Functional Status Assessment  Patient has had a recent decline in their functional status and demonstrates the ability to make significant improvements in function in a reasonable and predictable amount of time.  Equipment Recommendations  None recommended by OT    Recommendations for Other Services       Precautions / Restrictions Precautions Precautions: Other (comment) Precaution Comments: abdominal surgery Restrictions Weight Bearing Restrictions: No      Mobility Bed Mobility                    Transfers       Sit to Stand: Min guard           General transfer comment: cues for proper hand placement      Balance                                           ADL either performed or assessed with clinical judgement   ADL Overall ADL's : Needs assistance/impaired Eating/Feeding: Modified independent   Grooming: Wash/dry face;Wash/dry  hands;Supervision/safety;Standing   Upper Body Bathing: Sitting;Supervision/ safety   Lower Body Bathing: Minimal assistance;Sit to/from stand   Upper Body Dressing : Set up;Sitting   Lower Body Dressing: Moderate assistance   Toilet Transfer: Min guard;Ambulation   Toileting- Clothing Manipulation and Hygiene: Min guard       Functional mobility during ADLs: Min guard General ADL Comments: Pt. states his wife helped him at home.     Vision Baseline Vision/History:  (Pt. keeps l eye closed and states it hurts when he opens it because of the light. Pt. reports he can see out of it but pt. deferred further testing,) Ability to See in Adequate Light: 0 Adequate Patient Visual Report: No change from baseline       Perception     Praxis      Pertinent Vitals/Pain Pain Assessment Pain Assessment: 0-10 Pain Score: 6  Pain Location: r lower leg Pain Descriptors / Indicators: Aching Pain Intervention(s): Premedicated before session     Hand Dominance Right   Extremity/Trunk Assessment Upper Extremity Assessment Upper Extremity Assessment: Generalized weakness   Lower Extremity Assessment Lower Extremity Assessment: Overall WFL for tasks assessed;LLE deficits/detail LLE Deficits / Details: pt reports h/o mild LLE weakness 2* CVA 2015, ankle DF +3/5, knee ext +4/5 LLE Sensation: decreased light touch   Cervical / Trunk Assessment Cervical / Trunk Assessment: Normal   Communication Communication Communication: No difficulties   Cognition  Arousal/Alertness: Awake/alert Behavior During Therapy: WFL for tasks assessed/performed Overall Cognitive Status: No family/caregiver present to determine baseline cognitive functioning                                 General Comments: Pt. is able to follow directions. Pt. is not oriented to month or year.     General Comments       Exercises     Shoulder Instructions      Home Living Family/patient expects  to be discharged to:: Private residence Living Arrangements: Spouse/significant other Available Help at Discharge: Family;Available PRN/intermittently   Home Access: Stairs to enter     Home Layout: Two level Alternate Level Stairs-Number of Steps: flight   Bathroom Shower/Tub: Occupational psychologist: Standard     Home Equipment: Conservation officer, nature (2 wheels);Cane - single point;Shower seat;Grab bars - tub/shower;Hand held shower head;BSC/3in1          Prior Functioning/Environment Prior Level of Function : Independent/Modified Independent;Driving             Mobility Comments: sometimes uses cane, denies falls in past 6 months, drives, hunts ADLs Comments: sometimes needs a little help bathing/dressing        OT Problem List: Decreased activity tolerance;Decreased knowledge of use of DME or AE      OT Treatment/Interventions: Self-care/ADL training;Energy conservation;DME and/or AE instruction;Therapeutic activities;Patient/family education    OT Goals(Current goals can be found in the care plan section) Acute Rehab OT Goals Patient Stated Goal: go home OT Goal Formulation: With patient Time For Goal Achievement: 02/02/23 Potential to Achieve Goals: Good ADL Goals Pt Will Perform Grooming: with modified independence;standing Pt Will Perform Upper Body Bathing: with modified independence;sitting Pt Will Perform Lower Body Bathing: sit to/from stand;with adaptive equipment;with supervision Pt Will Perform Upper Body Dressing: with modified independence;sitting Pt Will Perform Lower Body Dressing: with min assist;sit to/from stand Pt Will Transfer to Toilet: with modified independence;ambulating Pt Will Perform Toileting - Clothing Manipulation and hygiene: with mod assist;sit to/from stand  OT Frequency: Min 2X/week    Co-evaluation              AM-PAC OT "6 Clicks" Daily Activity     Outcome Measure Help from another person eating meals?:  None Help from another person taking care of personal grooming?: A Little Help from another person toileting, which includes using toliet, bedpan, or urinal?: A Little Help from another person bathing (including washing, rinsing, drying)?: A Little Help from another person to put on and taking off regular upper body clothing?: A Little Help from another person to put on and taking off regular lower body clothing?: A Lot 6 Click Score: 18   End of Session Nurse Communication:  (ok therapy)  Activity Tolerance: Patient tolerated treatment well Patient left: in chair;with call bell/phone within reach  OT Visit Diagnosis: Unsteadiness on feet (R26.81)                Time:  -    Charges:     Reece Packer OT/L   [place] 01/19/2023, 11:51 AM

## 2023-01-20 NOTE — Progress Notes (Signed)
Continuous LR from OR has discontinued during shift change per order.

## 2023-01-20 NOTE — Progress Notes (Signed)
2 Days Post-Op Subjective: Patient with significant incisional discomfort.  Has not been ambulating well.  Has not had much to eat other than some clear liquids.  No bowel movement yet significant flatus by his report.  Objective: Vital signs in last 24 hours: Temp:  [97.8 F (36.6 C)-98.6 F (37 C)] 98.5 F (36.9 C) (02/11 0440) Pulse Rate:  [79-84] 79 (02/11 0440) Resp:  [16-18] 18 (02/11 0440) BP: (118-177)/(66-97) 153/94 (02/11 0440) SpO2:  [94 %-96 %] 96 % (02/11 0440)  Intake/Output from previous day: 02/10 0701 - 02/11 0700 In: 675 [P.O.:575; IV Piggyback:100] Out: 700 [Urine:700] Intake/Output this shift: No intake/output data recorded.  Physical Exam:  General: Alert and oriented Abdomen: Soft, ND, positive bowel sounds  Incisions: Look good without drainage Ext: NT, No erythema  Lab Results: Recent Labs    01/18/23 1408 01/19/23 0733  HGB 13.3 13.0  HCT 41.9 40.1   BMET Recent Labs    01/19/23 0529  NA 136  K 4.6  CL 101  CO2 23  GLUCOSE 156*  BUN 24*  CREATININE 1.86*  CALCIUM 9.0     Studies/Results: No results found.  Assessment/Plan: Status post left radical nephrectomy, slowly improving. Plan/recommendation.  Advance diet today encourage ambulation.  Hopeful DC home tomorrow    LOS: 2 days   Tony Davis 01/20/2023, 8:30 AM

## 2023-01-20 NOTE — Progress Notes (Signed)
Mobility Specialist - Progress Note   01/20/23 1550  Mobility  Activity Ambulated with assistance in hallway  Level of Assistance Standby assist, set-up cues, supervision of patient - no hands on  Assistive Device Front wheel walker  Distance Ambulated (ft) 250 ft  Activity Response Tolerated well  Mobility Referral Yes  $Mobility charge 1 Mobility   Pt received in bed and agreeable to mobility. Pt was MinA for sit-to-stand as well as bed mobility & contact during ambulation. No complaints during session. Pt to bed after session with all needs met & family in room.   [facility]

## 2023-01-21 MED ORDER — HYDRALAZINE HCL 50 MG PO TABS
100.0000 mg | ORAL_TABLET | Freq: Once | ORAL | Status: AC
  Administered 2023-01-21: 100 mg via ORAL
  Filled 2023-01-21: qty 2

## 2023-01-21 MED ORDER — ORAL CARE MOUTH RINSE: 15.0000 mL | OROMUCOSAL | Status: DC | PRN

## 2023-01-21 NOTE — Progress Notes (Addendum)
   01/21/23 0432  Vitals  Temp 98.4 F (36.9 C)  Temp Source Oral  BP (!) 188/104  MAP (mmHg) 129  BP Location Left Arm  BP Method Automatic  Patient Position (if appropriate) Lying  Pulse Rate 71  Resp 20  MEWS COLOR  MEWS Score Color Green  Oxygen Therapy  SpO2 98 %  O2 Device Room Air  MEWS Score  MEWS Temp 0  MEWS Systolic 0  MEWS Pulse 0  MEWS RR 0  MEWS LOC 0  MEWS Score 0   Pt's BP was elevated, but no PRN BP medication  available. No complaint of any pain per pt. Called and talked with Dr. Vedia Coffer a new order of Hydralazine PO '100mg'$  verbally, and waiting for an order in pt's chart. Will administer when it is available.

## 2023-01-21 NOTE — TOC Transition Note (Signed)
Transition of Care [facility]) - CM/SW Discharge Note   Patient Details  Name: Tony Davis. MRN: [id number] Date of Birth: 01/25/1961  Transition of Care [facility]) CM/SW Contact:  Dessa Phi, RN Phone Number: 01/21/2023, 11:11 AM   Clinical Narrative:Per [facility] rep Tony Davis states patient for d/c to [place] for medical mgmnt-She will manage d/c transporation. No further CM needs.Spoke to spouse Tony Davis agrees to SNF @ [facility]. No furrther CM needs.      Final next level of care: [place] Barriers to Discharge: No Barriers Identified   Patient Goals and CMS Choice      Discharge Placement                         Discharge Plan and Services Additional resources added to the After Visit Summary for     Discharge Planning Services: CM Consult                                 Social Determinants of Health (SDOH) Interventions SDOH Screenings   Food Insecurity: No Food Insecurity (01/19/2023)  Housing: Low Risk  (01/19/2023)  Transportation Needs: No Transportation Needs (01/19/2023)  Utilities: Not At Risk (01/19/2023)  Tobacco Use: Medium Risk (01/19/2023)     Readmission Risk Interventions     No data to display

## 2023-01-21 NOTE — Progress Notes (Signed)
Discharge instructions explained to wife, she verbalizes understanding.  Patient is being picked up and transported by wife.

## 2023-01-21 NOTE — TOC Transition Note (Signed)
Transition of Care [facility]) - CM/SW Discharge Note   Patient Details  Name: Tony Davis. MRN: [id number] Date of Birth: [birth year]  Transition of Care [facility]) CM/SW Contact:  Dessa Phi, RN Phone Number: 01/21/2023, 10:59 AM   Clinical Narrative: left vm w/[facility] rep Briana CSW-made her aware of no d/c needs. No further CM needs.        Barriers to Discharge: No Barriers Identified   Patient Goals and CMS Choice      Discharge Placement                         Discharge Plan and Services Additional resources added to the After Visit Summary for     Discharge Planning Services: CM Consult                                 Social Determinants of Health (SDOH) Interventions SDOH Screenings   Food Insecurity: No Food Insecurity (01/19/2023)  Housing: Low Risk  (01/19/2023)  Transportation Needs: No Transportation Needs (01/19/2023)  Utilities: Not At Risk (01/19/2023)  Tobacco Use: Medium Risk (01/19/2023)     Readmission Risk Interventions     No data to display

## 2023-01-21 NOTE — Discharge Summary (Signed)
Date of admission: 01/18/2023  Date of discharge: 01/21/2023  Admission diagnosis: two left renal masses  Discharge diagnosis: same  Secondary diagnoses:  Patient Active Problem List   Diagnosis Date Noted   Left renal mass 01/18/2023   Obesity (BMI 30-39.9) 04/21/2019   Altered mental status 09/20/2018   Hypokalemia 12/26/2015   Depression [id number]   Diastolic dysfunction with chronic heart failure ([place]) 11/30/2014   Essential hypertension 11/03/2014   LV dysfunction    Hydrocephalus (HCC)    Basal ganglia hemorrhage ([place]) 07/30/2014   Routine general medical examination at a health care facility 02/11/2014   Insomnia, persistent 03/25/2013   Chronic rhinitis 03/25/2013   Hemiparesis due to old cerebral infarction ([place]) 03/20/2013   Folate deficiency anemia 10/13/2012   Long term (current) use of anticoagulants 03/28/2012   Obstructive sleep apnea 08/16/2011   Prediabetes 08/06/2011   PATENT FORAMEN OVALE 03/13/2010   CAD, NATIVE VESSEL 01/26/2010   Hyperlipidemia with target LDL less than 100 01/23/2010   GERD 01/23/2010    Procedures performed: Procedure(s): LAPAROSCOPIC RADICAL LEFT NEPHRECTOMY  History and Physical: For full details, please see admission history and physical. Briefly, Tony Callais. is a 62 y.o. year old patient with two small left sided renal masses.   Hospital Course: Patient tolerated the procedure well.  He was then transferred to the floor after an uneventful PACU stay.  His hospital course was uncomplicated.  On POD#3 he had met discharge criteria: was eating a regular diet, was up and ambulating independently,  pain was well controlled, was voiding without a catheter, and was ready to for discharge.  Patient slow to move.  Discharge delayed due to his immobility.    Laboratory values:  Recent Labs    01/18/23 1408 01/19/23 0733  WBC  --  13.9*  HGB 13.3 13.0  HCT 41.9 40.1   Recent Labs    01/19/23 0529  NA 136  K 4.6  CL 101   CO2 23  GLUCOSE 156*  BUN 24*  CREATININE 1.86*  CALCIUM 9.0   No results for input(s): "LABPT", "INR" in the last 72 hours. No results for input(s): "LABURIN" in the last 72 hours. Results for orders placed or performed during the hospital encounter of 09/09/15  Urine culture     Status: None   Collection Time: 09/09/15 10:16 PM   Specimen: Urine, Clean Catch  Result Value Ref Range Status   Specimen Description URINE, CLEAN CATCH  Final   Special Requests NONE  Final   Culture MULTIPLE SPECIES PRESENT, SUGGEST RECOLLECTION  Final   Report Status 09/11/2015 FINAL  Final    Disposition: Home  Discharge instruction: The patient was instructed to be ambulatory but told to refrain from heavy lifting, strenuous activity, or driving.   Discharge medications:  Allergies as of 01/21/2023       Reactions   Iodine Anaphylaxis   Amlodipine Swelling, Other (See Comments)   Foot edema   Donepezil Other (See Comments)   Hallucinations,        Medication List     TAKE these medications    aspirin EC 81 MG tablet Take 81 mg by mouth daily.   atorvastatin 80 MG tablet Commonly known as: LIPITOR TAKE ONE TABLET BY MOUTH DAILY   escitalopram 20 MG tablet Commonly known as: LEXAPRO Take 20 mg by mouth daily.   famotidine 20 MG tablet Commonly known as: PEPCID Take 20 mg by mouth daily.   hydrALAZINE 50 MG tablet  Commonly known as: APRESOLINE TAKE TWO TABLETS BY MOUTH IN THE MORNING AND AT BEDTIME   lisinopril 40 MG tablet Commonly known as: ZESTRIL Take 40 mg by mouth daily.   memantine 10 MG tablet Commonly known as: NAMENDA Take 10 mg by mouth daily.   metoprolol succinate 100 MG 24 hr tablet Commonly known as: TOPROL-XL Take 1 tablet (100 mg total) by mouth 2 (two) times daily. Take with or immediately following a meal.   psyllium 0.52 g capsule Commonly known as: REGULOID Take 0.52 g by mouth daily as needed (constipation).   rivastigmine 6 MG  capsule Commonly known as: EXELON Take 6 mg by mouth 2 (two) times daily.   spironolactone 25 MG tablet Commonly known as: ALDACTONE Take 25 mg by mouth daily.   traMADol 50 MG tablet Commonly known as: Ultram Take 1-2 tablets (50-100 mg total) by mouth every 6 (six) hours as needed for moderate pain.   traZODone 100 MG tablet Commonly known as: DESYREL Take 200 mg by mouth at bedtime.   Vitamin D3 25 MCG (1000 UT) Caps Take 1,000 Units by mouth daily.        Followup:   Follow-up Information     [facility] Follow up in 2 week(s).   Why: For wound re-check Contact information: [place] [phone]

## 2023-01-22 LAB — SURGICAL PATHOLOGY

## 2023-04-10 ENCOUNTER — Ambulatory Visit: Payer: Medicare (Managed Care) | Attending: Vascular Surgery

## 2023-04-10 ENCOUNTER — Other Ambulatory Visit: Payer: Self-pay | Admitting: *Deleted

## 2023-04-10 ENCOUNTER — Encounter: Payer: Self-pay | Admitting: *Deleted

## 2023-04-10 DIAGNOSIS — R55 Syncope and collapse: Secondary | ICD-10-CM

## 2023-04-10 DIAGNOSIS — Q2112 Patent foramen ovale: Secondary | ICD-10-CM

## 2023-04-10 DIAGNOSIS — I5032 Chronic diastolic (congestive) heart failure: Secondary | ICD-10-CM

## 2023-04-10 DIAGNOSIS — I2510 Atherosclerotic heart disease of native coronary artery without angina pectoris: Secondary | ICD-10-CM

## 2023-04-10 NOTE — Progress Notes (Unsigned)
ZIO XT monitor enrolled to ship to [facility] for clinic to apply.  [facility] in care of Deryl Giroux [address] [place], [place]  [address]  Attn: Shanda Bumps Rice/ Tonye Becket DNP  Dr. Clifton James to read.

## 2023-04-12 DIAGNOSIS — R55 Syncope and collapse: Secondary | ICD-10-CM

## 2023-04-12 DIAGNOSIS — Q2112 Patent foramen ovale: Secondary | ICD-10-CM

## 2023-04-12 DIAGNOSIS — I2510 Atherosclerotic heart disease of native coronary artery without angina pectoris: Secondary | ICD-10-CM | POA: Diagnosis not present

## 2023-04-12 DIAGNOSIS — I5032 Chronic diastolic (congestive) heart failure: Secondary | ICD-10-CM | POA: Diagnosis not present

## 2023-04-19 ENCOUNTER — Ambulatory Visit (HOSPITAL_BASED_OUTPATIENT_CLINIC_OR_DEPARTMENT_OTHER)
Admission: RE | Admit: 2023-04-19 | Discharge: 2023-04-19 | Disposition: A | Discharge status: Home / Self Care | Payer: Medicare (Managed Care) | Source: Ambulatory Visit | Attending: Vascular Surgery | Admitting: Vascular Surgery

## 2023-04-19 ENCOUNTER — Other Ambulatory Visit (HOSPITAL_BASED_OUTPATIENT_CLINIC_OR_DEPARTMENT_OTHER): Payer: Self-pay | Admitting: Vascular Surgery

## 2023-04-19 DIAGNOSIS — I69354 Hemiplegia and hemiparesis following cerebral infarction affecting left non-dominant side: Secondary | ICD-10-CM

## 2023-04-19 DIAGNOSIS — F0153 Vascular dementia, unspecified severity, with mood disturbance: Secondary | ICD-10-CM

## 2023-05-08 ENCOUNTER — Other Ambulatory Visit: Payer: Self-pay | Admitting: Vascular Surgery

## 2023-05-08 ENCOUNTER — Telehealth: Payer: Self-pay | Admitting: Cardiovascular Disease

## 2023-05-08 ENCOUNTER — Ambulatory Visit
Admission: RE | Admit: 2023-05-08 | Discharge: 2023-05-08 | Disposition: A | Discharge status: Home / Self Care | Payer: Medicare (Managed Care) | Source: Ambulatory Visit | Attending: Vascular Surgery | Admitting: Vascular Surgery

## 2023-05-08 DIAGNOSIS — R059 Cough, unspecified: Secondary | ICD-10-CM

## 2023-05-08 NOTE — Telephone Encounter (Signed)
Caller stated patient has been on three blood pressure medication and has been taken off all of these medication.  Caller noted patient recently had a kidney removed.  Caller wants to know to know they need to continue monitoring patient's blood pressure or if the patient needs to be seen.

## 2023-05-08 NOTE — Telephone Encounter (Signed)
Spoke to the patient wife, Tony Davis, Tony Davis stated pt had radical left nephrectomy on 01/18/23. Pt is currently receiving Home care with [facility]. Ms. Tony Davis stated Brittany,DNP works with [facility] is  currently monitoring the  pt bp and medications. Pt wife stated his blood pressure is ranging between 97/77- 105/77. This past weekend the wife did not give pt metoprolol succinate and spironolactone due to systolic < 120 and diastolic < 80. Pt would like Tony Davis advise if he should schedule f/u visit since his surgery. Pt wife request if her husband does need an appointment to call her and [facility] [phone] to schedule the appointment. Will forward to MD and nurse for advise.

## 2023-05-09 NOTE — Telephone Encounter (Signed)
Left voicemail message for Tony Davis of [facility] to call the clinic. Spoke to the patient wife, [name], [place] pt is scheduled with Dr. Clifton James on 05/15/23 @ 1500. Pt wife voiced understanding.

## 2023-05-13 NOTE — Progress Notes (Signed)
Chief Complaint  Patient presents with   Follow-up    Near syncope   History of Present Illness: 62 yo male with a complex medical history including CAD, prior hemorrhagic CVA in 2015 with many compilcations, sleep apnea, HTN and HLD who is here today for cardiac follow up. I met him in 2011 and arranged a stress test, an echo and carotid dopplers at the first visit. The carotid dopplers were normal. He came in for his stress test and complained of neurological changes. He was sent to Dr. Stones office and had MRI/MRA of the head and neck. MRA showed no occlusive disease. MRI showed non-specific white matter changes but no clear infarct, possibly related to hypertension. His echo showed septal and inferobasal hypokinesis with mild concentric hypertrophy, EF 45-50%. Left heart cath October 2011 with severe stenosis in the mid Circumflex. A drug eluting stent was placed in the Circumflex artery. Repeat echo August 2012 with PFO with positive bubble study. I arranged for him to see Dr. Excell Seltzer for PFO closure but he skipped his appointment. He was in [place] April 2013 and had acute onset of left arm and leg weakness. He was admitted there. CT head without acute stroke. Venous dopplers with chronic DVT right femoral vein. Carotids with <50% RICA stenosis, LICA ok. Echo 03/14/12 in [place] with normal LV size and function with PFO with positive bubble study. He was started on coumadin and Lovenox and left AMA from the hospital there. I saw him in our office 03/19/12. At that time, he continued to have left sided weakness. He had not been compliant with plans for Lovenox and coumadin. He was admitted for Heparin and coumadin. TEE was done 03/21/12 and showed normal LVEF. The atrial septum had interatrial septum fenestrations without flow across and few large bubbles noted in LA. This was not felt to require closure. He was maintained on coumadin at that time. Venous dopplers 2013 with no DVT.  He was diagnosed  with OSA and was started on CPAP. He was admitted to [facility] May 2014 with chest pain. Cardiac markers were negative. Cardiac cath 04/24/13 with stable CAD. Patent stent distal Circumflex, mild disease LAD, moderate disease RCA. He had a hemorraghic stroke in [place] in July 2015 and coumadin was stopped. He had a prolonged hospitalization with many complications following his CVA (complicated by intraventricular extension and hydrocephalus requiring VP shunt, encephalopathy, protein calorie malnutrition, acute resp failure requiring tracheostomy and PEG, bilateral LE DVT s/p IVC filter, enterobacter pneumonia, subsequent right hemiparesis and difficulty communicating). No new neurological events between 2015 and 2023. Echo in December 2015 with normal LV size and function.   He underwent laparoscopic left radical nephrectomy in February 2024. He has had soft blood pressures at home and his wife called our office last week and reported that he needed to hold his metoprolol and aldactone due to soft BP. He has had some dizziness over the past few months following his nephrectomy. Cardiac monitor May 2024 with sinus rhythm and rare premature beats. No high grade AV block.    Primary Care Physician: [facility]  Past Medical History:  Diagnosis Date   Acute respiratory failure (HCC) 2015   a. Following BG hemorrhage - s/p trache with decannulation.   Basal ganglia hemorrhage (HCC) 2015   a. 2015 - with intraventricular extension and hydrocephalus requiring right crani and VP shunt, complicated by encephalopathy, protein calorie malnutrition, acute resp failure req trache and PEG,  BLE DVT s/p IVC, enterobacter PNA, subsequent right hemiparesis and difficulty communicating.   BPH (benign prostatic hyperplasia)    CAD (coronary artery disease)    a. s/p promus DES circumflex artery 10/13/10. b. Cath 04/2013: stable disease but possible small vessel disease -Imdur added.    Carotid artery disease (HCC)    a. 1-29% BICA by study 04/2014.   CVA (cerebral vascular accident) (HCC) 02/2010   a. CVA 2011, prior reported TIAs. b. CNS hemorrhage 2015.   CVA (cerebral vascular accident) (HCC) 06/2014   "hemorrhagic; in [place]; brain activity diminshed; ability to do anything; can't do ADLs on own"   DVT (deep venous thrombosis) (HCC) 2013   a. 2013. b. 2015 - reported BLE DVT s/p IVC filter.    Enterobacter cloacae pneumonia 2015   Falls    Folate deficiency anemia    GERD (gastroesophageal reflux disease)    Hematuria 07/2011   HLD (hyperlipidemia)    HTN (hypertension)    Hydrocephalus (HCC) 2015   a. 2/2 BG hemorrhage.   Incontinence of feces    Lower extremity edema    LV dysfunction    a. 2011 - EF 45-50%, subsequent 55-60% in 04/2014.   Noncompliance    Orthostatic hypotension    OSA on CPAP    "not using CPAP right now" (11/30/2014)   PFO (patent foramen ovale)    a. echo 8/12: EF 55-65%, grade 2 diast dysfnx; +PFO on bubble study. b. TEE 4/13: EF normal, atrial septum with suspicion for interatrial septum fenestrations without flow across and few large bubbles noted in LA.  This was not felt to require closure    Protein calorie malnutrition (HCC)    Rheumatoid arthritis(714.0)    S/P percutaneous endoscopic gastrostomy (PEG) tube placement (HCC) 2015   TIA (transient ischemic attack)    "1-2; both after the one in 02/2010" (09/25/2013)   Urinary incontinence     Past Surgical History:  Procedure Laterality Date   CORONARY ANGIOPLASTY WITH STENT PLACEMENT  12/10/2010   "1" (09/24/2013)   filter in place for clots      per wife at preop on 01/11/23   KNEE ARTHROSCOPY Bilateral 08/11/1999   2 on left and 3 on rt   LAPAROSCOPIC NEPHRECTOMY Left 01/18/2023   Procedure: LAPAROSCOPIC RADICAL LEFT NEPHRECTOMY;  Surgeon: Crist Fat, MD;  Location: [facility];  Service: Urology;  Laterality: Left;  210 MINUTES NEDDED FOR CASE   LEFT HEART  CATHETERIZATION WITH CORONARY ANGIOGRAM N/A 04/24/2013   Procedure: LEFT HEART CATHETERIZATION WITH CORONARY ANGIOGRAM;  Surgeon: Kathleene Hazel, MD;  Location[facility] C[facility] LAB;  Service: Cardiovascular;  Laterality: N/A;   shunt in head      TEE WITHOUT CARDIOVERSION  03/21/2012   Procedure: TRANSESOPHAGEAL ECHOCARDIOGRAM (TEE);  Surgeon: Pricilla Riffle, MD;  Location: [facility] ENDOSCOPY;  Service: Cardiovascular;  Laterality: N/A;    Current Outpatient Medications  Medication Sig Dispense Refill   aspirin EC 81 MG tablet Take 81 mg by mouth daily.     atorvastatin (LIPITOR) 80 MG tablet TAKE ONE TABLET BY MOUTH DAILY 90 tablet 0   Cholecalciferol (VITAMIN D3) 25 MCG (1000 UT) CAPS Take 1,000 Units by mouth daily.     escitalopram (LEXAPRO) 20 MG tablet Take 20 mg by mouth daily.     famotidine (PEPCID) 20 MG tablet Take 20 mg by mouth daily.     memantine (NAMENDA) 10 MG tablet Take 10 mg by mouth daily.  metoprolol succinate (TOPROL-XL) 100 MG 24 hr tablet Take 1 tablet (100 mg total) by mouth 2 (two) times daily. Take with or immediately following a meal. 180 tablet 3   psyllium (REGULOID) 0.52 g capsule Take 0.52 g by mouth daily as needed (constipation).     rivastigmine (EXELON) 6 MG capsule Take 6 mg by mouth 2 (two) times daily.     traZODone (DESYREL) 100 MG tablet Take 200 mg by mouth at bedtime.     hydrALAZINE (APRESOLINE) 50 MG tablet TAKE TWO TABLETS BY MOUTH IN THE MORNING AND AT BEDTIME 360 tablet 3   No current facility-administered medications for this visit.    Allergies  Allergen Reactions   Iodine Anaphylaxis   Amlodipine Swelling and Other (See Comments)    Foot edema   Donepezil Other (See Comments)    Hallucinations,    Social History   Socioeconomic History   Marital status: Married    Spouse name: Not on file   Number of children: Not on file   Years of education: Not on file   Highest education level: Not on file  Occupational History   Not on file   Tobacco Use   Smoking status: Former    Packs/day: 1.00    Years: 35.00    Additional pack years: 0.00    Total pack years: 35.00    Types: Cigarettes    Quit date: 03/10/2012    Years since quitting: 11.1   Smokeless tobacco: Never  Vaping Use   Vaping Use: Never used  Substance and Sexual Activity   Alcohol use: No    Alcohol/week: 0.0 standard drinks of alcohol   Drug use: No   Sexual activity: Not Currently    Partners: Female  Other Topics Concern   Not on file  Social History Narrative   Married, 5 kids; lives in [place]. 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    Social Determinants of Health   Financial Resource Strain: Not on file  Food Insecurity: No Food Insecurity (01/19/2023)   Hunger Vital Sign    Worried About Running Out of Food in the Last Year: Never true    Ran Out of Food in the Last Year: Never true  Transportation Needs: No Transportation Needs (01/19/2023)   PRAPARE - Administrator, Civil Service (Medical): No    Lack of Transportation (Non-Medical): No  Physical Activity: Not on file  Stress: Not on file  Social Connections: Not on file  Intimate Partner Violence: Not At Risk (01/19/2023)   Humiliation, Afraid, Rape, and Kick questionnaire    Fear of Current or Ex-Partner: No    Emotionally Abused: No    Physically Abused: No    Sexually Abused: No    Family History  Problem Relation Age of Onset   Heart attack Father    Stroke Father    Heart attack Sister    Hypertension Brother    Seizures Sister    Stroke Sister    Cancer Neg Hx    Kidney disease Neg Hx    Diabetes Neg Hx     Review of Systems:  As stated in the HPI and otherwise negative.   BP (!) 140/120 Comment: 130/110 in right arm  Pulse 60   Ht 5\' 11"  (1.803 m)   Wt 111.5 kg   SpO2 99%   BMI 34.28 kg/m   Physical Examination: General: Well developed, well nourished, NAD  HEENT: OP  clear, mucus membranes moist  SKIN: warm, dry. No  rashes. Neuro: No focal deficits  Musculoskeletal: Muscle strength 5/5 all ext  Psychiatric: Mood and affect normal  Neck: No JVD, no carotid bruits, no thyromegaly, no lymphadenopathy.  Lungs:Clear bilaterally, no wheezes, rhonci, crackles Cardiovascular: Regular rate and rhythm. No murmurs, gallops or rubs. Abdomen:Soft. Bowel sounds present. Non-tender.  Extremities: No lower extremity edema. Pulses are 2 + in the bilateral DP/PT.  Echo 11/10/14: Left ventricle: The cavity size was normal. There was moderate   concentric hypertrophy. Systolic function was vigorous. The   estimated ejection fraction was in the range of 65% to 70%. Wall   motion was normal; there were no regional wall motion   abnormalities. Features are consistent with a pseudonormal left   ventricular filling pattern, with concomitant abnormal relaxation   and increased filling pressure (grade 2 diastolic dysfunction).   Doppler parameters are consistent with elevated ventricular   end-diastolic filling pressure. - Aortic valve: Trileaflet; normal thickness leaflets. There was no   regurgitation. - Mitral valve: Structurally normal valve. There was no   regurgitation. - Left atrium: The atrium was mildly dilated. - Right ventricle: Systolic function was normal. - Right atrium: The atrium was normal in size. - Pulmonary arteries: Systolic pressure was within the normal   range. - Inferior vena cava: The vessel was normal in size. - Pericardium, extracardiac: There was no pericardial effusion.  EKG:  EKG is not ordered today. The ekg ordered today demonstrates   Recent Labs: 01/19/2023: BUN 24; Creatinine, Ser 1.86; Hemoglobin 13.0; Platelets 209; Potassium 4.6; Sodium 136   Lipid Panel    Component Value Date/Time   CHOL 225 (H) 07/09/2016 1654   TRIG 343.0 (H) 07/09/2016 1654   HDL 37.50 (L) 07/09/2016 1654   CHOLHDL 6 07/09/2016 1654   VLDL 68.6 (H) 07/09/2016 1654   LDLCALC 64 02/11/2014 1422    LDLDIRECT 145.0 07/09/2016 1654     Wt Readings from Last 3 Encounters:  05/14/23 111.5 kg  01/18/23 116 kg  01/11/23 116 kg    Assessment and Plan:   History of recurrent DVT: First in 2013, then BLE DVT in 2015 with IVC filter placed at that time. He is not felt to be a candidate for anticoagulation due to his CNS hemorrhage  History of CVA: He is followed in Neurology  HTN: BP was soft at home last week when he had pneumonia. He has been holding hydralazine, aldactone and Lisinopril. Since his last creatinine was 2.6 in March 2024 post nephrectomy, will not restart Lisinopril or aldactone. Will continue Toprol 100 mg po BID and resume Hydralazine 100 mg po BID.   CAD without angina: No chest pain. Continue ASA, statin and beta blocker.   Sleep apnea: He has CPAP but does not wear it.   Near Syncope: Cardiac monitor recently with no AV block. Will arrange an echo to assess LVEF.   Labs/ tests ordered today include:   Orders Placed This Encounter  Procedures   ECHOCARDIOGRAM COMPLETE   Disposition:   F/U with me or office APP in 6 months.   Signed, Verne Carrow, MD 05/14/2023 3:41 PM    [facility] [address] [place], [place], [place]  [address] Phone: [phone]; Fax: [phone]

## 2023-05-14 ENCOUNTER — Ambulatory Visit: Payer: Medicare (Managed Care) | Attending: Cardiovascular Disease | Admitting: Cardiovascular Disease

## 2023-05-14 ENCOUNTER — Encounter: Payer: Self-pay | Admitting: Cardiovascular Disease

## 2023-05-14 VITALS — BP 140/120 | HR 60 | Ht 71.0 in | Wt 245.8 lb

## 2023-05-14 DIAGNOSIS — I10 Essential (primary) hypertension: Secondary | ICD-10-CM

## 2023-05-14 DIAGNOSIS — R55 Syncope and collapse: Secondary | ICD-10-CM | POA: Diagnosis not present

## 2023-05-14 DIAGNOSIS — I2510 Atherosclerotic heart disease of native coronary artery without angina pectoris: Secondary | ICD-10-CM | POA: Diagnosis not present

## 2023-05-14 MED ORDER — HYDRALAZINE HCL 50 MG PO TABS: ORAL_TABLET | ORAL | 3 refills | Status: DC

## 2023-05-14 NOTE — Patient Instructions (Signed)
Medication Instructions:  Your physician has recommended you make the following change in your medication:  1.) stop lisinopril 2.) stop spironolactone (Aldactone) 3.) continue Toprol XL 100 mg twice daily 4.) continue hydralazine 100 mg twice daily  *If you need a refill on your cardiac medications before your next appointment, please call your pharmacy*   Lab Work: none  Testing/Procedures: Your physician has requested that you have an echocardiogram. Echocardiography is a painless test that uses sound waves to create images of your heart. It provides your doctor with information about the size and shape of your heart and how well your heart's chambers and valves are working. This procedure takes approximately one hour. There are no restrictions for this procedure. Please do NOT wear cologne, perfume, aftershave, or lotions (deodorant is allowed). Please arrive 15 minutes prior to your appointment time.   Follow-Up: At [facility], you and your health needs are our priority.  As part of our continuing mission to provide you with exceptional heart care, we have created designated Provider Care Teams.  These Care Teams include your primary Cardiologist (physician) and Advanced Practice Providers (APPs -  Physician Assistants and Nurse Practitioners) who all work together to provide you with the care you need, when you need it.   Your next appointment:   6 month(s)  Provider:   Verne Carrow, MD   or  Advanced Practice Practitioner (NP or PA-C)

## 2023-05-21 ENCOUNTER — Telehealth: Payer: Self-pay | Admitting: Cardiovascular Disease

## 2023-05-21 NOTE — Telephone Encounter (Signed)
   Pre-operative Risk Assessment    Patient Name: Tony Davis.  DOB: Sep 07, 1961 MRN: [id number]      Request for Surgical Clearance    Procedure:  Laser lithotripsy and ureteroscopy - Two part surgery  Date of Surgery:  Clearance 06/20/23 and 06/27/23                                 Surgeon:    Dr. Vallery Ridge Group or Practice Name:  [facility] Phone number:  [phone]  Fax number:  [phone]   Type of Clearance Requested:   - Medical  - Pharmacy:  Hold Aspirin     Type of Anesthesia:  General    Additional requests/questions:   Caller stated patient will be having surgery in 2 parts and will need to hold Aspirin 5 days prior to first surgery. Caller noted patient will have his second surgery 7 days later and wants to know how this should be addressed.  Signed, Annetta Maw   05/21/2023, 8:43 AM

## 2023-05-30 ENCOUNTER — Other Ambulatory Visit: Payer: Self-pay | Admitting: Urology

## 2023-05-30 NOTE — Telephone Encounter (Signed)
Duplicate request was sent in today to our office. I did d/w the pre op APP today as to confirm if echo is needed for pre op clearance, per Edd Fabian, FNP yes. I will update all parties involved.

## 2023-06-06 ENCOUNTER — Ambulatory Visit (HOSPITAL_COMMUNITY): Payer: Medicare (Managed Care) | Attending: Cardiovascular Disease

## 2023-06-06 DIAGNOSIS — R55 Syncope and collapse: Secondary | ICD-10-CM

## 2023-06-06 DIAGNOSIS — I10 Essential (primary) hypertension: Secondary | ICD-10-CM

## 2023-06-06 DIAGNOSIS — I2510 Atherosclerotic heart disease of native coronary artery without angina pectoris: Secondary | ICD-10-CM

## 2023-06-06 LAB — ECHOCARDIOGRAM COMPLETE
Area-P 1/2: 2.91 cm2
S' Lateral: 2.1 cm

## 2023-06-06 NOTE — Telephone Encounter (Signed)
   Patient Name: Tony Davis.  DOB: 1961/10/15 MRN: [id number]  Primary Cardiologist: Verne Carrow, MD  Chart reviewed as part of pre-operative protocol coverage. Given past medical history and time since last visit, based on ACC/AHA guidelines, Tony Davis. is at acceptable risk for the planned procedure without further cardiovascular testing.  The enclosed clearance is for both laser lithotripsy and ureteroscopy procedures.  Patient can hold ASA 81 mg 12 days prior to procedure and should restart postprocedure when surgically safe.  The patient was advised that if he develops new symptoms prior to surgery to contact our office to arrange for a follow-up visit, and he verbalized understanding.  I will route this recommendation to the requesting party via Epic fax function and remove from pre-op pool.  Please call with questions.  Napoleon Form, Leodis Rains, NP 06/06/2023, 3:05 PM

## 2023-06-14 ENCOUNTER — Other Ambulatory Visit (HOSPITAL_COMMUNITY): Payer: Medicare (Managed Care)

## 2023-07-11 NOTE — Patient Instructions (Addendum)
SURGICAL WAITING ROOM VISITATION  Patients having surgery or a procedure may have no more than 2 support people in the waiting area - these visitors may rotate.    Children under the age of 57 must have an adult with them who is not the patient.  Due to an increase in RSV and influenza rates and associated hospitalizations, children ages 46 and under may not visit patients in [facility] hospitals.  If the patient needs to stay at the hospital during part of their recovery, the visitor guidelines for inpatient rooms apply. Pre-op nurse will coordinate an appropriate time for 1 support person to accompany patient in pre-op.  This support person may not rotate.    Please refer to the [facility] website for the visitor guidelines for Inpatients (after your surgery is over and you are in a regular room).    Your procedure is scheduled on: 07/18/23   Report to [facility] Main Entrance    Report to admitting at 5:15 AM   Call this number if you have problems the morning of surgery [phone]   Do not eat food or drink liquids :After Midnight.          If you have questions, please contact your surgeon's office.   FOLLOW BOWEL PREP AND ANY ADDITIONAL PRE OP INSTRUCTIONS YOU RECEIVED FROM YOUR SURGEON'S OFFICE!!!     Oral Hygiene is also important to reduce your risk of infection.                                    Remember - BRUSH YOUR TEETH THE MORNING OF SURGERY WITH YOUR REGULAR TOOTHPASTE  DENTURES WILL BE REMOVED PRIOR TO SURGERY PLEASE DO NOT APPLY "Poly grip" OR ADHESIVES!!!   Stop all vitamins and herbal supplements 7 days before surgery.   Take these medicines the morning of surgery with A SIP OF WATER: Atorvastatin, Lexapro, Hydralazine, Metoprolol, Rivastigmine, Tylenol, Famotidine                               You may not have any metal on your body including jewelry, and body piercing             Do not wear lotions, powders, cologne, or deodorant               Men may shave face and neck.   Do not bring valuables to the hospital. [facility] IS NOT             RESPONSIBLE   FOR VALUABLES.   Contacts, glasses, dentures or bridgework may not be worn into surgery.  DO NOT BRING YOUR HOME MEDICATIONS TO THE HOSPITAL. PHARMACY WILL DISPENSE MEDICATIONS LISTED ON YOUR MEDICATION LIST TO YOU DURING YOUR ADMISSION IN THE HOSPITAL!    Patients discharged on the day of surgery will not be allowed to drive home.  Someone NEEDS to stay with you for the first 24 hours after anesthesia.   Special Instructions: Bring a copy of your healthcare power of attorney and living will documents the day of surgery if you haven't scanned them before.              Please read over the following fact sheets you were given: IF YOU HAVE QUESTIONS ABOUT YOUR PRE-OP INSTRUCTIONS PLEASE CALL [name]    If you received a COVID  test during your pre-op visit  it is requested that you wear a mask when out in public, stay away from anyone that may not be feeling well and notify your surgeon if you develop symptoms. If you test positive for Covid or have been in contact with anyone that has tested positive in the last 10 days please notify you surgeon.    [facility] - Preparing for Surgery Before surgery, you can play an important role.  Because skin is not sterile, your skin needs to be as free of germs as possible.  You can reduce the number of germs on your skin by washing with CHG (chlorahexidine gluconate) soap before surgery.  CHG is an antiseptic cleaner which kills germs and bonds with the skin to continue killing germs even after washing. Please DO NOT use if you have an allergy to CHG or antibacterial soaps.  If your skin becomes reddened/irritated stop using the CHG and inform your nurse when you arrive at Short Stay. Do not shave (including legs and underarms) for at least 48 hours prior to the first CHG shower.  You may shave your face/neck.  Please follow  these instructions carefully:  1.  Shower with CHG Soap the night before surgery and the  morning of surgery.  2.  If you choose to wash your hair, wash your hair first as usual with your normal  shampoo.  3.  After you shampoo, rinse your hair and body thoroughly to remove the shampoo.                             4.  Use CHG as you would any other liquid soap.  You can apply chg directly to the skin and wash.  Gently with a scrungie or clean washcloth.  5.  Apply the CHG Soap to your body ONLY FROM THE NECK DOWN.   Do   not use on face/ open                           Wound or open sores. Avoid contact with eyes, ears mouth and   genitals (private parts).                       Wash face,  Genitals (private parts) with your normal soap.             6.  Wash thoroughly, paying special attention to the area where your    surgery  will be performed.  7.  Thoroughly rinse your body with warm water from the neck down.  8.  DO NOT shower/wash with your normal soap after using and rinsing off the CHG Soap.                9.  Pat yourself dry with a clean towel.            10.  Wear clean pajamas.            11.  Place clean sheets on your bed the night of your first shower and do not  sleep with pets. Day of Surgery : Do not apply any lotions/deodorants the morning of surgery.  Please wear clean clothes to the hospital/surgery center.  FAILURE TO FOLLOW THESE INSTRUCTIONS MAY RESULT IN THE CANCELLATION OF YOUR SURGERY  PATIENT SIGNATURE_________________________________  NURSE SIGNATURE__________________________________  ________________________________________________________________________

## 2023-07-11 NOTE — Progress Notes (Addendum)
COVID Vaccine Completed: yes  Date of COVID positive in last 90 days:  PCP - [facility] Cardiologist - Verne Carrow, MD LOV 05/14/23 Neurologist- Joycelyn Schmid, MD LOV 01/08/23  Cardiac clearance by Rolin Barry 06/06/23 in Epic  Chest x-ray - 05/09/23 Epic EKG - 01/11/23 Epic Stress Test - 2011 ECHO - 06/06/23 Epic Cardiac Cath - 2015 Pacemaker/ICD device last checked: n/a Spinal Cord Stimulator: n/a  Bowel Prep - n/a  Sleep Study - yes CPAP - no  Fasting Blood Sugar - pre DM Checks Blood Sugar no meds or checks at home  Last dose of GLP1 agonist-  N/A GLP1 instructions:  N/A   Last dose of SGLT-2 inhibitors-  N/A SGLT-2 instructions: N/A   Blood Thinner Instructions:  Time Aspirin Instructions: ASA 81, hold 5 days Last Dose:  Activity level: Can perform activities of daily living without stopping and without symptoms of chest pain. SOB with activity, not new per pt. Has weakness post stroke   Anesthesia review: CAD, HTN, PFO, CHF, OSA, hemorrhagic stroke, near syncope, hypotension, creatinine 1.97  Patient denies shortness of breath, fever, cough and chest pain at PAT appointment  Patient verbalized understanding of instructions that were given to them at the PAT appointment. Patient was also instructed that they will need to review over the PAT instructions again at home before surgery.

## 2023-07-12 ENCOUNTER — Other Ambulatory Visit: Payer: Self-pay

## 2023-07-12 ENCOUNTER — Encounter (HOSPITAL_COMMUNITY)
Admission: RE | Admit: 2023-07-12 | Discharge: 2023-07-12 | Disposition: A | Discharge status: Home / Self Care | Payer: Medicare (Managed Care) | Source: Ambulatory Visit | Attending: Urology | Admitting: Urology

## 2023-07-12 ENCOUNTER — Encounter (HOSPITAL_COMMUNITY): Payer: Self-pay

## 2023-07-12 VITALS — BP 157/82 | HR 64 | Temp 97.7°F | Resp 16 | Ht 71.5 in | Wt 245.8 lb

## 2023-07-12 DIAGNOSIS — G4733 Obstructive sleep apnea (adult) (pediatric): Secondary | ICD-10-CM | POA: Insufficient documentation

## 2023-07-12 DIAGNOSIS — M069 Rheumatoid arthritis, unspecified: Secondary | ICD-10-CM | POA: Diagnosis not present

## 2023-07-12 DIAGNOSIS — N200 Calculus of kidney: Secondary | ICD-10-CM | POA: Diagnosis not present

## 2023-07-12 DIAGNOSIS — K219 Gastro-esophageal reflux disease without esophagitis: Secondary | ICD-10-CM | POA: Insufficient documentation

## 2023-07-12 DIAGNOSIS — I10 Essential (primary) hypertension: Secondary | ICD-10-CM

## 2023-07-12 DIAGNOSIS — Z01812 Encounter for preprocedural laboratory examination: Secondary | ICD-10-CM | POA: Diagnosis not present

## 2023-07-12 DIAGNOSIS — I2510 Atherosclerotic heart disease of native coronary artery without angina pectoris: Secondary | ICD-10-CM | POA: Diagnosis not present

## 2023-07-12 DIAGNOSIS — I119 Hypertensive heart disease without heart failure: Secondary | ICD-10-CM | POA: Insufficient documentation

## 2023-07-12 DIAGNOSIS — E785 Hyperlipidemia, unspecified: Secondary | ICD-10-CM | POA: Insufficient documentation

## 2023-07-12 DIAGNOSIS — Z87891 Personal history of nicotine dependence: Secondary | ICD-10-CM | POA: Insufficient documentation

## 2023-07-12 HISTORY — DX: Personal history of urinary calculi: Z87.442

## 2023-07-12 LAB — CBC
HCT: 44 % (ref 39.0–52.0)
Hemoglobin: 13.4 g/dL (ref 13.0–17.0)
MCH: 29.4 pg (ref 26.0–34.0)
MCHC: 30.5 g/dL (ref 30.0–36.0)
MCV: 96.5 fL (ref 80.0–100.0)
Platelets: 225 10*3/uL (ref 150–400)
RBC: 4.56 MIL/uL (ref 4.22–5.81)
RDW: 13 % (ref 11.5–15.5)
WBC: 6.1 10*3/uL (ref 4.0–10.5)
nRBC: 0 % (ref 0.0–0.2)

## 2023-07-12 LAB — BASIC METABOLIC PANEL
Anion gap: 11 (ref 5–15)
BUN: 19 mg/dL (ref 6–20)
CO2: 27 mmol/L (ref 22–32)
Calcium: 9.1 mg/dL (ref 8.9–10.3)
Chloride: 101 mmol/L (ref 98–111)
Creatinine, Ser: 1.97 mg/dL — ABNORMAL HIGH (ref 0.61–1.24)
GFR, Estimated: 38 mL/min — ABNORMAL LOW (ref >60)
Glucose, Bld: 151 mg/dL — ABNORMAL HIGH (ref 70–99)
Potassium: 3.1 mmol/L — ABNORMAL LOW (ref 3.5–5.1)
Sodium: 139 mmol/L (ref 135–145)

## 2023-07-12 NOTE — Progress Notes (Signed)
Creatinine 1.97 results routed to Dr. Marlou Porch

## 2023-07-15 ENCOUNTER — Encounter (HOSPITAL_COMMUNITY): Payer: Self-pay

## 2023-07-15 NOTE — Anesthesia Preprocedure Evaluation (Signed)
Anesthesia Evaluation  Patient identified by MRN, date of birth, ID band Patient awake    Reviewed: Allergy & Precautions, NPO status , Patient's Chart, lab work & pertinent test results  Airway Mallampati: III  TM Distance: >3 FB Neck ROM: Full    Dental no notable dental hx. (+) Teeth Intact, Dental Advisory Given   Pulmonary sleep apnea and Continuous Positive Airway Pressure Ventilation , former smoker   Pulmonary exam normal breath sounds clear to auscultation       Cardiovascular hypertension, + CAD and + DVT  Normal cardiovascular exam Rhythm:Regular Rate:Normal  06/06/2023 echo 1. No LVOT obstruction. Left ventricular ejection fraction, by  estimation, is 60 to 65%. The left ventricle has normal function. The left  ventricle has no regional wall motion abnormalities. There is moderate  concentric left ventricular hypertrophy.  Left ventricular diastolic parameters are consistent with Grade I  diastolic dysfunction (impaired relaxation).   2. Right ventricular systolic function is normal. The right ventricular  size is normal.   3. The mitral valve is normal in structure. No evidence of mitral valve  regurgitation.   4. The aortic valve is grossly normal. Aortic valve regurgitation is not  visualized.   5. The inferior vena cava is normal in size with greater than 50%  respiratory variability, suggesting right atrial pressure of 3 mmHg.     Neuro/Psych  PSYCHIATRIC DISORDERS  Depression   Dementia CVA (L facial droop), Residual Symptoms    GI/Hepatic ,GERD  ,,  Endo/Other    Renal/GU Renal InsufficiencyRenal diseaseLab Results      Component                Value               Date                            K                        3.1 (L)             07/12/2023                CREATININE               1.97 (H)            07/12/2023                GFRNONAA                 38 (L)              07/12/2023                     Musculoskeletal   Abdominal   Peds  Hematology  (+) REFUSES BLOOD PRODUCTSLab Results      Component                Value               Date                      WBC                      6.1                 07/12/2023  HGB                      13.4                07/12/2023                HCT                      44.0                07/12/2023                MCV                      96.5                07/12/2023                PLT                      225                 07/12/2023              Anesthesia Other Findings   Reproductive/Obstetrics                              Anesthesia Physical Anesthesia Plan  ASA: 3  Anesthesia Plan: General   Post-op Pain Management:    Induction: Intravenous  PONV Risk Score and Plan: Treatment may vary due to age or medical condition, Ondansetron, Midazolam and Dexamethasone  Airway Management Planned: Oral ETT  Additional Equipment: None  Intra-op Plan:   Post-operative Plan: Extubation in OR  Informed Consent: I have reviewed the patients History and Physical, chart, labs and discussed the procedure including the risks, benefits and alternatives for the proposed anesthesia with the patient or authorized representative who has indicated his/her understanding and acceptance.     Dental advisory given  Plan Discussed with:   Anesthesia Plan Comments: (See PAT note from 8/2 by Sherlie Ban PA-C )         Anesthesia Quick Evaluation

## 2023-07-15 NOTE — Progress Notes (Signed)
Case: [id number] Date/Time: 07/18/23 0715   Procedure: CYSTOSCOPY RIGHT URETEROSCOPY, HOLMIUM LASER LITHOTRIPSY, AND RIGHT URETERAL STENT PLACEMENT (Right) - 90 MINUTES   Anesthesia type: General   Pre-op diagnosis: RIGHT RENAL CALCULUS   Location: [facility]   Surgeons: Tony Fat, MD       DISCUSSION: Tony Davis is a 62 yo male who presents to PAT prior to surgery above. Patient with complex PMH including former smoking, HTN, HLD, CAD s/p PCI to circumflex in 2011, carotid artery disease, hx of hemorrhagic CVA (s/p right craniotomy and right VP shunt in 2015 with multiple complications; required trach/PEG), hx of PFO, GERD, hx of DVT s/p IVC filter, RA, OSA (does not use CPAP), left renal mass s/p nephrectomy (01/18/23), and kidney stones.  No prior anesthesia complications.  Patient last saw Cardiology on 05/14/23. He has had low blood pressures and lightheadedness and some of his BP meds were stopped. An echo was ordered due to complaints of dizziness which was completed on 06/06/23 and looked normal. Cardiac clearance for upcoming surgery received on 6/27:  "Chart reviewed as part of pre-operative protocol coverage. Given past medical history and time since last visit, based on ACC/AHA guidelines, Tony Davis. is at acceptable risk for the planned procedure without further cardiovascular testing.  The enclosed clearance is for both laser lithotripsy and ureteroscopy procedures. Patient can hold ASA 81 mg 12 days prior to procedure and should restart postprocedure when surgically safe."  On screening PAT labs K was noted to be 3.1. SCr has been slowing rising to 1.97. Dr. Jasmine Davis office made aware of results.  VS: BP (!) 157/82   Pulse 64   Temp 36.5 C (Oral)   Resp 16   Ht 5' 11.5" (1.816 m)   Wt 111.5 kg   SpO2 99%   BMI 33.81 kg/m   PROVIDERS: Inc, [facility] Cardiologist - Tony Carrow, MD Tony Davis  05/14/23 Neurologist- Tony Schmid, MD LOV 01/08/23  LABS: Labs reviewed: Acceptable for surgery. (all labs ordered are listed, but only abnormal results are displayed)  Labs Reviewed  BASIC METABOLIC PANEL - Abnormal; Notable for the following components:      Result Value   Potassium 3.1 (*)    Glucose, Bld 151 (*)    Creatinine, Ser 1.97 (*)    GFR, Estimated 38 (*)    All other components within normal limits  CBC     IMAGES:    EKG:   CV:  Echo 06/06/23:  IMPRESSIONS     1. No LVOT obstruction. Left ventricular ejection fraction, by  estimation, is 60 to 65%. The left ventricle has normal function. The left  ventricle has no regional wall motion abnormalities. There is moderate  concentric left ventricular hypertrophy.  Left ventricular diastolic parameters are consistent with Grade I  diastolic dysfunction (impaired relaxation).   2. Right ventricular systolic function is normal. The right ventricular  size is normal.   3. The mitral valve is normal in structure. No evidence of mitral valve  regurgitation.   4. The aortic valve is grossly normal. Aortic valve regurgitation is not  visualized.   5. The inferior vena cava is normal in size with greater than 50%  respiratory variability, suggesting right atrial pressure of 3 mmHg.   Past Medical History:  Diagnosis Date   Acute respiratory failure (HCC) 2015   a. Following BG hemorrhage - s/p trache with decannulation.   Basal ganglia  hemorrhage (HCC) 2015   a. 2015 - with intraventricular extension and hydrocephalus requiring right crani and VP shunt, complicated by encephalopathy, protein calorie malnutrition, acute resp failure req trache and PEG, BLE DVT s/p IVC, enterobacter PNA, subsequent right hemiparesis and difficulty communicating.   BPH (benign prostatic hyperplasia)    CAD (coronary artery disease)    a. s/p promus DES circumflex artery 10/13/10. b. Cath 04/2013: stable disease but possible small  vessel disease -Imdur added.   Carotid artery disease (HCC)    a. 1-29% BICA by study 04/2014.   CVA (cerebral vascular accident) (HCC) 02/2010   a. CVA 2011, prior reported TIAs. b. CNS hemorrhage 2015.   CVA (cerebral vascular accident) (HCC) 06/2014   "hemorrhagic; in [place]; brain activity diminshed; ability to do anything; can't do ADLs on own"   DVT (deep venous thrombosis) (HCC) 2013   a. 2013. b. 2015 - reported BLE DVT s/p IVC filter.    Enterobacter cloacae pneumonia 2015   Falls    Folate deficiency anemia    GERD (gastroesophageal reflux disease)    Hematuria 07/2011   History of kidney stones    HLD (hyperlipidemia)    HTN (hypertension)    Hydrocephalus (HCC) 2015   a. 2/2 BG hemorrhage.   Incontinence of feces    Lower extremity edema    LV dysfunction    a. 2011 - EF 45-50%, subsequent 55-60% in 04/2014.   Noncompliance    Orthostatic hypotension    OSA on CPAP    "not using CPAP right now" (11/30/2014)   PFO (patent foramen ovale)    a. echo 8/12: EF 55-65%, grade 2 diast dysfnx; +PFO on bubble study. b. TEE 4/13: EF normal, atrial septum with suspicion for interatrial septum fenestrations without flow across and few large bubbles noted in LA.  This was not felt to require closure    Protein calorie malnutrition (HCC)    Rheumatoid arthritis(714.0)    S/P percutaneous endoscopic gastrostomy (PEG) tube placement (HCC) 2015   TIA (transient ischemic attack)    "1-2; both after the one in 02/2010" (09/25/2013)   Urinary incontinence     Past Surgical History:  Procedure Laterality Date   CORONARY ANGIOPLASTY WITH STENT PLACEMENT  12/10/2010   "1" (09/24/2013)   filter in place for clots      per wife at preop on 01/11/23   KNEE ARTHROSCOPY Bilateral 08/11/1999   2 on left and 3 on rt   LAPAROSCOPIC NEPHRECTOMY Left 01/18/2023   Procedure: LAPAROSCOPIC RADICAL LEFT NEPHRECTOMY;  Surgeon: Tony Fat, MD;  Location: [facility];  Service: Urology;  Laterality:  Left;  210 MINUTES NEDDED FOR CASE   LEFT HEART CATHETERIZATION WITH CORONARY ANGIOGRAM N/A 04/24/2013   Procedure: LEFT HEART CATHETERIZATION WITH CORONARY ANGIOGRAM;  Surgeon: Tony Hazel, MD;  Location: [facility] CATH LAB;  Service: Cardiovascular;  Laterality: N/A;   shunt in head      TEE WITHOUT CARDIOVERSION  03/21/2012   Procedure: TRANSESOPHAGEAL ECHOCARDIOGRAM (TEE);  Surgeon: Pricilla Riffle, MD;  Location: [facility] ENDOSCOPY;  Service: Cardiovascular;  Laterality: N/A;    MEDICATIONS:  acetaminophen (TYLENOL) 325 MG tablet   aspirin EC 81 MG tablet   atorvastatin (LIPITOR) 80 MG tablet   Cholecalciferol (VITAMIN D3) 25 MCG (1000 UT) CAPS   escitalopram (LEXAPRO) 20 MG tablet   famotidine (PEPCID) 20 MG tablet   hydrALAZINE (APRESOLINE) 100 MG tablet   hydrALAZINE (APRESOLINE) 50 MG tablet   loperamide (IMODIUM A-D) 2  MG tablet   memantine (NAMENDA) 10 MG tablet   metoprolol succinate (TOPROL-XL) 100 MG 24 hr tablet   polyethylene glycol (MIRALAX / GLYCOLAX) 17 g packet   psyllium (REGULOID) 0.52 g capsule   rivastigmine (EXELON) 6 MG capsule   traZODone (DESYREL) 100 MG tablet   Zinc Oxide (TRIPLE PASTE) 12.8 % ointment   No current facility-administered medications for this encounter.   Marcille Blanco [facility] Short Stay/Anesthesiology [facility] Phone [phone] 07/15/2023 2:51 PM

## 2023-07-18 ENCOUNTER — Ambulatory Visit (HOSPITAL_COMMUNITY)
Admission: RE | Admit: 2023-07-18 | Discharge: 2023-07-18 | Disposition: A | Discharge status: Home / Self Care | Payer: Medicare (Managed Care) | Attending: Urology | Admitting: Urology

## 2023-07-18 ENCOUNTER — Ambulatory Visit (HOSPITAL_BASED_OUTPATIENT_CLINIC_OR_DEPARTMENT_OTHER): Payer: Medicare (Managed Care) | Admitting: Anesthesiology

## 2023-07-18 ENCOUNTER — Other Ambulatory Visit: Payer: Self-pay

## 2023-07-18 ENCOUNTER — Ambulatory Visit (HOSPITAL_COMMUNITY): Payer: Medicare (Managed Care) | Admitting: Medical

## 2023-07-18 ENCOUNTER — Encounter (HOSPITAL_COMMUNITY): Payer: Self-pay | Admitting: Urology

## 2023-07-18 ENCOUNTER — Encounter (HOSPITAL_COMMUNITY)
Admission: RE | Disposition: A | Discharge status: Home / Self Care | Payer: Self-pay | Source: Home / Self Care | Attending: Urology

## 2023-07-18 ENCOUNTER — Ambulatory Visit (HOSPITAL_COMMUNITY): Payer: Medicare (Managed Care)

## 2023-07-18 DIAGNOSIS — I10 Essential (primary) hypertension: Secondary | ICD-10-CM

## 2023-07-18 DIAGNOSIS — G4730 Sleep apnea, unspecified: Secondary | ICD-10-CM | POA: Diagnosis not present

## 2023-07-18 DIAGNOSIS — I69392 Facial weakness following cerebral infarction: Secondary | ICD-10-CM | POA: Insufficient documentation

## 2023-07-18 DIAGNOSIS — N200 Calculus of kidney: Secondary | ICD-10-CM | POA: Insufficient documentation

## 2023-07-18 DIAGNOSIS — I2510 Atherosclerotic heart disease of native coronary artery without angina pectoris: Secondary | ICD-10-CM

## 2023-07-18 DIAGNOSIS — F32A Depression, unspecified: Secondary | ICD-10-CM | POA: Insufficient documentation

## 2023-07-18 DIAGNOSIS — Z87891 Personal history of nicotine dependence: Secondary | ICD-10-CM | POA: Insufficient documentation

## 2023-07-18 DIAGNOSIS — G4733 Obstructive sleep apnea (adult) (pediatric): Secondary | ICD-10-CM | POA: Diagnosis not present

## 2023-07-18 DIAGNOSIS — F0390 Unspecified dementia without behavioral disturbance: Secondary | ICD-10-CM | POA: Diagnosis not present

## 2023-07-18 HISTORY — PX: CYSTOSCOPY/URETEROSCOPY/HOLMIUM LASER/STENT PLACEMENT: SHX6546

## 2023-07-18 LAB — GLUCOSE, CAPILLARY: Glucose-Capillary: 91 mg/dL (ref 70–99)

## 2023-07-18 SURGERY — CYSTOSCOPY/URETEROSCOPY/HOLMIUM LASER/STENT PLACEMENT
Anesthesia: General | Laterality: Right

## 2023-07-18 MED ORDER — PHENYLEPHRINE 80 MCG/ML (10ML) SYRINGE FOR IV PUSH (FOR BLOOD PRESSURE SUPPORT)
PREFILLED_SYRINGE | INTRAVENOUS | Status: DC | PRN
  Administered 2023-07-18: 160 ug via INTRAVENOUS
  Administered 2023-07-18 (×2): 240 ug via INTRAVENOUS

## 2023-07-18 MED ORDER — EPHEDRINE SULFATE-NACL 50-0.9 MG/10ML-% IV SOSY
PREFILLED_SYRINGE | INTRAVENOUS | Status: DC | PRN
  Administered 2023-07-18: 15 mg via INTRAVENOUS
  Administered 2023-07-18: 10 mg via INTRAVENOUS

## 2023-07-18 MED ORDER — IOHEXOL 300 MG/ML  SOLN
INTRAMUSCULAR | Status: DC | PRN
  Administered 2023-07-18: 10 mL

## 2023-07-18 MED ORDER — ACETAMINOPHEN 10 MG/ML IV SOLN: 1000.0000 mg | Freq: Once | INTRAVENOUS | Status: DC | PRN

## 2023-07-18 MED ORDER — GLYCOPYRROLATE 0.2 MG/ML IJ SOLN
INTRAMUSCULAR | Status: AC
  Filled 2023-07-18: qty 1

## 2023-07-18 MED ORDER — LACTATED RINGERS IV SOLN: INTRAVENOUS | Status: DC

## 2023-07-18 MED ORDER — OXYCODONE HCL 5 MG/5ML PO SOLN: 5.0000 mg | Freq: Once | ORAL | Status: DC | PRN

## 2023-07-18 MED ORDER — DEXAMETHASONE SODIUM PHOSPHATE 10 MG/ML IJ SOLN
INTRAMUSCULAR | Status: AC
  Filled 2023-07-18: qty 1

## 2023-07-18 MED ORDER — FENTANYL CITRATE (PF) 100 MCG/2ML IJ SOLN
INTRAMUSCULAR | Status: AC
  Filled 2023-07-18: qty 2

## 2023-07-18 MED ORDER — HYDROMORPHONE HCL 1 MG/ML IJ SOLN: 0.2500 mg | INTRAMUSCULAR | Status: DC | PRN

## 2023-07-18 MED ORDER — PHENYLEPHRINE HCL (PRESSORS) 10 MG/ML IV SOLN
INTRAVENOUS | Status: AC
  Filled 2023-07-18: qty 1

## 2023-07-18 MED ORDER — ONDANSETRON HCL 4 MG/2ML IJ SOLN
INTRAMUSCULAR | Status: DC | PRN
  Administered 2023-07-18: 4 mg via INTRAVENOUS

## 2023-07-18 MED ORDER — ONDANSETRON HCL 4 MG/2ML IJ SOLN: 4.0000 mg | Freq: Once | INTRAMUSCULAR | Status: DC | PRN

## 2023-07-18 MED ORDER — PROPOFOL 10 MG/ML IV BOLUS
INTRAVENOUS | Status: DC | PRN
  Administered 2023-07-18: 40 mg via INTRAVENOUS
  Administered 2023-07-18: 160 mg via INTRAVENOUS

## 2023-07-18 MED ORDER — ORAL CARE MOUTH RINSE: 15.0000 mL | Freq: Once | OROMUCOSAL | Status: DC

## 2023-07-18 MED ORDER — ONDANSETRON HCL 4 MG/2ML IJ SOLN
INTRAMUSCULAR | Status: AC
  Filled 2023-07-18: qty 2

## 2023-07-18 MED ORDER — FENTANYL CITRATE (PF) 100 MCG/2ML IJ SOLN
INTRAMUSCULAR | Status: DC | PRN
  Administered 2023-07-18: 50 ug via INTRAVENOUS

## 2023-07-18 MED ORDER — PHENYLEPHRINE 80 MCG/ML (10ML) SYRINGE FOR IV PUSH (FOR BLOOD PRESSURE SUPPORT)
PREFILLED_SYRINGE | INTRAVENOUS | Status: AC
  Filled 2023-07-18: qty 10

## 2023-07-18 MED ORDER — AMISULPRIDE (ANTIEMETIC) 5 MG/2ML IV SOLN: 10.0000 mg | Freq: Once | INTRAVENOUS | Status: DC | PRN

## 2023-07-18 MED ORDER — OXYCODONE HCL 5 MG PO TABS: 5.0000 mg | ORAL_TABLET | Freq: Once | ORAL | Status: DC | PRN

## 2023-07-18 MED ORDER — SODIUM CHLORIDE 0.9 % IR SOLN
Status: DC | PRN
  Administered 2023-07-18: 3000 mL via INTRAVESICAL

## 2023-07-18 MED ORDER — EPHEDRINE 5 MG/ML INJ
INTRAVENOUS | Status: AC
  Filled 2023-07-18: qty 5

## 2023-07-18 MED ORDER — PHENYLEPHRINE HCL-NACL 20-0.9 MG/250ML-% IV SOLN
INTRAVENOUS | Status: DC | PRN
  Administered 2023-07-18: 50 ug/min via INTRAVENOUS

## 2023-07-18 MED ORDER — TRAMADOL HCL 50 MG PO TABS: 50.0000 mg | ORAL_TABLET | Freq: Four times a day (QID) | ORAL | 0 refills | Status: DC | PRN

## 2023-07-18 MED ORDER — CIPROFLOXACIN HCL 500 MG PO TABS: 500.0000 mg | ORAL_TABLET | Freq: Two times a day (BID) | ORAL | 0 refills | Status: DC

## 2023-07-18 MED ORDER — LIDOCAINE HCL (PF) 2 % IJ SOLN
INTRAMUSCULAR | Status: AC
  Filled 2023-07-18: qty 5

## 2023-07-18 MED ORDER — CHLORHEXIDINE GLUCONATE 0.12 % MT SOLN: 15.0000 mL | Freq: Once | OROMUCOSAL | Status: DC

## 2023-07-18 MED ORDER — CIPROFLOXACIN IN D5W 400 MG/200ML IV SOLN
400.0000 mg | INTRAVENOUS | Status: AC
  Administered 2023-07-18: 400 mg via INTRAVENOUS
  Filled 2023-07-18: qty 200

## 2023-07-18 MED ORDER — LIDOCAINE 2% (20 MG/ML) 5 ML SYRINGE
INTRAMUSCULAR | Status: DC | PRN
  Administered 2023-07-18: 100 mg via INTRAVENOUS

## 2023-07-18 MED ORDER — PROPOFOL 10 MG/ML IV BOLUS
INTRAVENOUS | Status: AC
  Filled 2023-07-18: qty 20

## 2023-07-18 MED ORDER — TAMSULOSIN HCL 0.4 MG PO CAPS: 0.4000 mg | ORAL_CAPSULE | Freq: Every day | ORAL | 0 refills | Status: DC

## 2023-07-18 MED ORDER — GLYCOPYRROLATE 0.2 MG/ML IJ SOLN
INTRAMUSCULAR | Status: DC | PRN
  Administered 2023-07-18: .2 mg via INTRAVENOUS

## 2023-07-18 MED ORDER — MIDAZOLAM HCL 2 MG/2ML IJ SOLN
INTRAMUSCULAR | Status: AC
  Filled 2023-07-18: qty 2

## 2023-07-18 SURGICAL SUPPLY — 23 items
BAG URO CATCHER STRL LF (MISCELLANEOUS) ×1 IMPLANT
BASKET ZERO TIP NITINOL 2.4FR (BASKET) IMPLANT
BSKT STON RTRVL ZERO TP 2.4FR (BASKET)
CATH URETERAL DUAL LUMEN 10F (MISCELLANEOUS) IMPLANT
CATH URETL OPEN 5X70 (CATHETERS) ×1 IMPLANT
CLOTH BEACON ORANGE TIMEOUT ST (SAFETY) ×1 IMPLANT
EXTRACTOR STONE 1.7FRX115CM (UROLOGICAL SUPPLIES) IMPLANT
GLOVE SURG LX STRL 7.5 STRW (GLOVE) ×1 IMPLANT
GOWN STRL REUS W/ TWL XL LVL3 (GOWN DISPOSABLE) ×1 IMPLANT
GOWN STRL REUS W/TWL XL LVL3 (GOWN DISPOSABLE) ×1
GUIDEWIRE ANG ZIPWIRE 038X150 (WIRE) IMPLANT
GUIDEWIRE STR DUAL SENSOR (WIRE) ×1 IMPLANT
KIT TURNOVER KIT A (KITS) IMPLANT
LASER FIB FLEXIVA PULSE ID 365 (Laser) IMPLANT
MANIFOLD NEPTUNE II (INSTRUMENTS) ×1 IMPLANT
PACK CYSTO (CUSTOM PROCEDURE TRAY) ×1 IMPLANT
SHEATH NAVIGATOR HD 12/14X28 (SHEATH) IMPLANT
SHEATH NAVIGATOR HD 12/14X36 (SHEATH) IMPLANT
STENT URET 6FRX26 CONTOUR (STENTS) IMPLANT
TRACTIP FLEXIVA PULS ID 200XHI (Laser) IMPLANT
TRACTIP FLEXIVA PULSE ID 200 (Laser) ×1
TUBING CONNECTING 10 (TUBING) ×1 IMPLANT
TUBING UROLOGY SET (TUBING) ×1 IMPLANT

## 2023-07-18 NOTE — Interval H&P Note (Signed)
History and Physical Interval Note:  07/18/2023 7:33 AM  Tony Davis.  has presented today for surgery, with the diagnosis of RIGHT RENAL CALCULUS.  The various methods of treatment have been discussed with the patient and family. After consideration of risks, benefits and other options for treatment, the patient has consented to  Procedure(s) with comments: CYSTOSCOPY RIGHT URETEROSCOPY, HOLMIUM LASER LITHOTRIPSY, AND RIGHT URETERAL STENT PLACEMENT (Right) - 90 MINUTES as a surgical intervention.  The patient's history has been reviewed, patient examined, no change in status, stable for surgery.  I have reviewed the patient's chart and labs.  Questions were answered to the patient's satisfaction.     Crist Fat

## 2023-07-18 NOTE — Discharge Instructions (Signed)
DISCHARGE INSTRUCTIONS FOR KIDNEY STONE/URETERAL STENT   MEDICATIONS:  1.  Resume all your other meds from home - except do not take any extra narcotic pain meds that you may have at home.  2. Tamsulosin is for stent discomfort and spasm. 3. Tramadol is for moderate/severe pain, otherwise taking upto 1000 mg every 6 hours of plainTylenol will help treat your pain.     ACTIVITY:  1. No strenuous activity x 1week  2. No driving while on narcotic pain medications  3. Drink plenty of water  4. Continue to walk at home - you can still get blood clots when you are at home, so keep active, but don't over do it.  5. May return to work/school tomorrow or when you feel ready   BATHING:  1. You can shower and we recommend daily showers  2. You have a string coming from your urethra: The stent string is attached to your ureteral stent. Do not pull on this.   SIGNS/SYMPTOMS TO CALL:  Please call us if you have a fever greater than 101.5, uncontrolled nausea/vomiting, uncontrolled pain, dizziness, unable to urinate, bloody urine, chest pain, shortness of breath, leg swelling, leg pain, redness around wound, drainage from wound, or any other concerns or questions.   You can reach [place] at [phone].   FOLLOW-UP:  You have an appointment for stent removal in 1 week.

## 2023-07-18 NOTE — Op Note (Signed)
Preoperative diagnosis:  Right renal stones, 2 cm  Postoperative diagnosis:  Same  Procedure: Cystoscopy, right retrograde pyelogram with interpretation Right ureteroscopy, laser lithotripsy Right ureteral stent placement  Surgeon: Crist Fat, MD  Anesthesia: General  Complications: None  Intraoperative findings:  #1: The patient's right retrograde pyelogram demonstrated normal caliber ureter no filling defects or significant abnormality.  There was a small filling defect in the right upper pole consistent with the patient's known large stone. 2.:  Patient had a large stone in the upper pole as well as in the midpole calyx.  He also had a smaller stone that was adherent to the papilla which I was able to easily removed.  The stones were dusted, some of the larger fragments were removed first stone analysis.  All the stones were noted to be fragmented to a size that were easily passable.  EBL: Minimal  Specimens: Right renal stones  Indication: Tony Davis. is a 62 y.o. patient with solitary kidney and a large right sided renal stone burden and progressively worsening kidney function.  After reviewing the management options for treatment, he elected to proceed with the above surgical procedure(s). We have discussed the potential benefits and risks of the procedure, side effects of the proposed treatment, the likelihood of the patient achieving the goals of the procedure, and any potential problems that might occur during the procedure or recuperation. Informed consent has been obtained.  Description of procedure:   Consent was obtained the preoperative holding area.  He was then brought back to the operating room placed on table supine position.  General anesthesia was then induced and endotracheal tube was inserted.  He was placed in dorsolithotomy position prepped and draped in the routine sterile fashion.  21 French 3 degree cystoscope was then joined past with the  patient treating in the bladder under visual guidance.  Cystoscopy was performed with no significant findings.  The ureteral orifices were orthotopic.  Right ureteral orifice was cannulated with 5 [place] open-ended ureteral catheter and retrograde Polygram was performed the above findings.  Subsequently advanced a dual-lumen catheter over the wire and advanced a second wire into the right urinary tract.  I remove the dual-lumen catheter over the wire emptied the bladder and removed the cystoscope.  I then advanced a 11/13 [place] ureteral access sheath up to the midportion of the ureter under fluoroscopic guidance without any significant difficulty.  The inner portion of the sheath was removed as was the second wire.  I then advanced the flexible ureteroscope up into the collecting system through the access sheath and performed pyeloscopy.  There were stones in the upper pole and lower pole that were sizable.  There is an interpolar stone that I was able to easily basket without laser fragmentation.  The remaining stones I dusted with a 200 m laser fiber with settings of 60 Hz and 0.3 J.  I did remove some of the larger pieces so that we could get a stone composition analysis.  This was done using an engage basket.  The remaining fragments were noted to be less than 2 mm in size and left within the kidney to pass.  At the end of the case I injected contrast into the patient's kidney and perform pyeloscopy under fluoroscopic guidance and inspected all the calyces ensuring that there were no large stones or fragments remaining.  I then slowly backed out the ureteroscope and the access sheath there were no significant ureteral trauma.  I then  advanced a 26 cm time 6 French double-J ureteral stent over the wire and into the right collecting system under fluoroscopic guidance.  I advanced it up to the pubic ramus before deploying the wire.  Denies curl was noted within the kidney as well as in the bladder as confirmed  by fluoroscopy.  The stent was pulled through the patient's urethra and secured to the dorsum of his penis.  The patient was subsequently extubated and returned to PACU in stable condition.  There were no untoward complications.  Disposition: The patient is being discharged home, we will plan to remove the stent in clinic in 1 week.

## 2023-07-18 NOTE — H&P (Signed)
62 year old male with a history of hemorrhagic stroke presents today for further discussion of progressive renal impairment.   The patient is status post left nephrectomy for renal cell carcinoma in February 2024. He tolerated the surgery relatively well. His renal function has been fairly stable with a creatinine ranging between 2.5 and 2.8. He does not drink very much water or fluids at all. He is mostly incontinent. He has little complaints today.   The patient does have a large stone burden in the right kidney as well as a large right simple renal cyst.     ALLERGIES: Iodine    MEDICATIONS: Aspirin 81 mg tablet,chewable  Metoprolol Succinate  Amlodipine Besylate  Atorvastatin Calcium 80 mg tablet  Escitalopram Oxalate  Famotidine 20 mg tablet  Hydralazine Hcl 50 mg tablet  Psyllium Fiber  Rivastigmine  Spironolactone 25 mg tablet  Tylenol  Vitamin D3     GU PSH: Radical nephrectomy (laparoscopic), Left - 01/18/2023     NON-GU PSH: No Non-GU PSH    GU PMH: Renal cell carcinoma, left - 03/06/2023, - 02/01/2023 Left renal neoplasm - 01/01/2023, - 11/20/2022      PMH Notes:  1898-12-10 00:00:00 - Note: Normal Routine History And Physical Adult   NON-GU PMH: Encounter for other preprocedural examination - 01/01/2023 Atrial Fibrillation DVT, History Hypercholesterolemia Hypertension Sleep Apnea Stroke/TIA    FAMILY HISTORY: No Family History    SOCIAL HISTORY: Marital Status: Married Preferred Language: English; Ethnicity: Not Hispanic Or Latino; Race: Black or African American Current Smoking Status: Patient does not smoke anymore. Has not smoked since 11/09/2009.   Tobacco Use Assessment Completed: Used Tobacco in last 30 days? Has never drank.  Does not drink caffeine.    REVIEW OF SYSTEMS:    GU Review Male:   Patient denies frequent urination, hard to postpone urination, burning/ pain with urination, get up at night to urinate, leakage of urine, stream starts and  stops, trouble starting your stream, have to strain to urinate , erection problems, and penile pain.  Gastrointestinal (Upper):   Patient denies nausea, vomiting, and indigestion/ heartburn.  Gastrointestinal (Lower):   Patient denies diarrhea and constipation.  Constitutional:   Patient denies fever, night sweats, weight loss, and fatigue.  Skin:   Patient denies skin rash/ lesion and itching.  Eyes:   Patient denies blurred vision and double vision.  Ears/ Nose/ Throat:   Patient denies sinus problems and sore throat.  Hematologic/Lymphatic:   Patient denies swollen glands and easy bruising.  Cardiovascular:   Patient denies leg swelling and chest pains.  Respiratory:   Patient denies cough and shortness of breath.  Endocrine:   Patient denies excessive thirst.  Musculoskeletal:   Patient denies back pain and joint pain.  Neurological:   Patient denies headaches and dizziness.  Psychologic:   Patient denies depression and anxiety.   VITAL SIGNS: None   Complexity of Data:  Source Of History:  Patient  Records Review:   Previous Doctor Records, Previous Patient Records, POC Tool  Urine Test Review:   Urinalysis  X-Ray Review: Renal Ultrasound: Reviewed Films. Discussed With Patient.  C.T. Abdomen/Pelvis: Reviewed Films. Discussed With Patient.     PROCEDURES:         Renal Ultrasound - 03500  Right kidney  Length: 13.3 cm Depth: 6.67 cm Cortical Width: 1.92 cm Width: 7.11cm  Left Kidney nephrectomy, nothing could be seen in the renal fossa due to bowel gas.    Left Kidney/Ureter:  Hx of RCC  with a radial nephrectomy on 02/24  Right Kidney/Ureter:  two large cysts with the largest being 7.83x8.54x8.46cm with sepatations noted within several non obstructive calcs noted  Bladder:  pvr 19ml      . Patient confirmed No Neulasta OnPro Device.           Urinalysis w/Scope - 81001 Dipstick Dipstick Cont'd Micro  Color: Amber Bilirubin: Neg WBC/hpf: 0 - 5/hpf  Appearance: Cloudy  Ketones: Neg RBC/hpf: 0 - 2/hpf  Specific Gravity: 1.025 Blood: Trace Bacteria: Few (10-25/hpf)  pH: 6.5 Protein: 2+ Cystals: NS (Not Seen)  Glucose: Neg Urobilinogen: 1.0 Casts: Hyaline    Nitrites: Neg Trichomonas: Not Present    Leukocyte Esterase: Trace Mucous: Present      Epithelial Cells: 20 - 40/hpf      Yeast: NS (Not Seen)      Sperm: Not Present    Notes:      ASSESSMENT:      ICD-10 Details  1 GU:   Renal calculus - N20.0      PLAN:           Orders X-Rays: Renal Ultrasound          Schedule         Document Letter(s):  Created for Patient: Clinical Summary         Notes:   Patient's renal ultrasound demonstrates no hydronephrosis. He does have a large renal cyst. This is simple in nature and of no clinical significance. The patient has quite a bit of stone in the right kidney. We discussed management for that. I do not think it is currently creating any of his renal dysfunction, but I did recommend that we go ahead and think about treating this because at some point it will create some renal dysfunction and/or an emergent problem.   We discussed treatment options for that right nonobstructing kidney stones and I recommended staged ureteroscopy as I think that he would tolerate this best and has the lowest risk of any complications associated with his kidney. I went through the procedure with his wife in detail. She wants to get this scheduled this summer while she is off of work. We reach out to [facility] for scheduling. He does need clearance from his cardiologist.

## 2023-07-18 NOTE — Anesthesia Procedure Notes (Signed)
Procedure Name: LMA Insertion Date/Time: 07/18/2023 7:37 AM  Performed by: Doran Clay, CRNAPre-anesthesia Checklist: Patient identified, Emergency Drugs available, Suction available, Patient being monitored and Timeout performed Patient Re-evaluated:Patient Re-evaluated prior to induction Oxygen Delivery Method: Circle system utilized Preoxygenation: Pre-oxygenation with 100% oxygen Induction Type: IV induction LMA: LMA with gastric port inserted LMA Size: 5.0 Tube type: Oral Number of attempts: 1 Placement Confirmation: positive ETCO2 and breath sounds checked- equal and bilateral Tube secured with: Tape Dental Injury: Teeth and Oropharynx as per pre-operative assessment

## 2023-07-18 NOTE — Anesthesia Postprocedure Evaluation (Signed)
Anesthesia Post Note  Patient: Tony Davis.  Procedure(s) Performed: CYSTOSCOPY RIGHT URETEROSCOPY, HOLMIUM LASER LITHOTRIPSY, AND RIGHT URETERAL STENT PLACEMENT (Right)     Patient location during evaluation: PACU Anesthesia Type: General Level of consciousness: awake and alert Pain management: pain level controlled Vital Signs Assessment: post-procedure vital signs reviewed and stable Respiratory status: spontaneous breathing, nonlabored ventilation, respiratory function stable and patient connected to nasal cannula oxygen Cardiovascular status: blood pressure returned to baseline and stable Postop Assessment: no apparent nausea or vomiting Anesthetic complications: no   No notable events documented.  Last Vitals:  Vitals:   07/18/23 1115 07/18/23 1130  BP: 126/86 (!) 135/94  Pulse: 64 68  Resp: (!) 8 10  Temp: (!) 36.2 C   SpO2: 92% 100%    Last Pain:  Vitals:   07/18/23 1130  TempSrc:   PainSc: 0-No pain                 Trevor Iha

## 2023-07-18 NOTE — Transfer of Care (Signed)
Immediate Anesthesia Transfer of Care Note  Patient: Tony Davis.  Procedure(s) Performed: CYSTOSCOPY RIGHT URETEROSCOPY, HOLMIUM LASER LITHOTRIPSY, AND RIGHT URETERAL STENT PLACEMENT (Right)  Patient Location: PACU  Anesthesia Type:General  Level of Consciousness: sedated  Airway & Oxygen Therapy: Patient Spontanous Breathing and Patient connected to face mask oxygen  Post-op Assessment: Report given to RN and Post -op Vital signs reviewed and stable  Post vital signs: Reviewed and stable  Last Vitals:  Vitals Value Taken Time  BP 138/92 07/18/23 0940  Temp    Pulse 80 07/18/23 0941  Resp 15 07/18/23 0941  SpO2 100 % 07/18/23 0941  Vitals shown include unfiled device data.  Last Pain:  Vitals:   07/18/23 0536  TempSrc: Oral         Complications: No notable events documented.

## 2023-07-19 ENCOUNTER — Encounter (HOSPITAL_COMMUNITY): Payer: Self-pay | Admitting: Urology

## 2023-07-25 ENCOUNTER — Ambulatory Visit (HOSPITAL_COMMUNITY): Admission: RE | Admit: 2023-07-25 | Payer: Medicare (Managed Care) | Source: Home / Self Care | Admitting: Urology

## 2023-07-25 ENCOUNTER — Encounter (HOSPITAL_COMMUNITY): Admission: RE | Payer: Self-pay | Source: Home / Self Care

## 2023-07-25 SURGERY — CYSTOSCOPY/URETEROSCOPY/HOLMIUM LASER/STENT PLACEMENT
Anesthesia: General | Laterality: Right

## 2023-07-27 LAB — CALCULI, WITH PHOTOGRAPH (CLINICAL LAB)

## 2023-07-29 ENCOUNTER — Ambulatory Visit (INDEPENDENT_AMBULATORY_CARE_PROVIDER_SITE_OTHER): Payer: Medicare (Managed Care) | Admitting: Diagnostic Neuroimaging

## 2023-07-29 ENCOUNTER — Encounter: Payer: Self-pay | Admitting: Diagnostic Neuroimaging

## 2023-07-29 VITALS — BP 153/86 | HR 88 | Ht 72.0 in | Wt 244.0 lb

## 2023-07-29 DIAGNOSIS — F01A0 Vascular dementia, mild, without behavioral disturbance, psychotic disturbance, mood disturbance, and anxiety: Secondary | ICD-10-CM

## 2023-07-29 DIAGNOSIS — R4689 Other symptoms and signs involving appearance and behavior: Secondary | ICD-10-CM

## 2023-07-29 DIAGNOSIS — G4733 Obstructive sleep apnea (adult) (pediatric): Secondary | ICD-10-CM

## 2023-07-29 DIAGNOSIS — I610 Nontraumatic intracerebral hemorrhage in hemisphere, subcortical: Secondary | ICD-10-CM

## 2023-07-29 NOTE — Patient Instructions (Signed)
  INTERMITTENT UNRESPONSIVE SPELLS (since ~2020; could be related to excessive fatigue related to untreated OSA vs sequelae fro mprior basal ganglia intracerebral hemorrhage; also had one event of hypotension with these symptoms; no convulsions; usually can be aroused out of spells; unlikely to represent seizure) - encouraged patient to restart CPAP of OSA

## 2023-07-29 NOTE — Progress Notes (Signed)
[facility]  PATIENT: Tony Davis. DOB: 1961-09-21  REFERRING CLINICIAN: Clarene Duke, NP HISTORY FROM: patient REASON FOR VISIT: follow up   HISTORICAL  CHIEF COMPLAINT:  Chief Complaint  Patient presents with   New Problem     Rm 6, here with wife [place] Pt is here for consult to discuss absence seizures?? Pt's wife states he has episodes of unresponsives, states he has had episodes of confusion.        HISTORY OF PRESENT ILLNESS:   UPDATE (07/29/23, VRP): Since last visit, doing ABOUT THE SAME. Of note, since ~2020, having intermittent spells of falling asleep, lethargic, tired; one time at [facility] had hypotension SBP 70-80's with an event. Was avble to be aroused out of it.  Wife notes multiple events like this over the years; but never brought to medical attention. No convulsions.   PRIOR HPI (01/08/23, VRP): 62 year old male here for evaluation of vascular dementia.  For stroke occurred in 2011 with left-sided weakness and visual field loss.  In 2013 patient had DVT was started on anticoagulation.  He was also found to have PFO.  In 2015 had left basal ganglia hemorrhage with intraventricular extension, hydrocephalus requiring right-sided craniotomy and VP shunt.  Since then has had progressive cognitive decline.   REVIEW OF SYSTEMS: Full 14 system review of systems performed and negative with exception of: As per HPI.  ALLERGIES: Allergies  Allergen Reactions   Iodine Anaphylaxis   Amlodipine Swelling and Other (See Comments)    Foot edema   Donepezil Other (See Comments)    Hallucinations,    HOME MEDICATIONS: Outpatient Medications Prior to Visit  Medication Sig Dispense Refill   acetaminophen (TYLENOL) 325 MG tablet Take 650 mg by mouth every 6 (six) hours as needed for moderate pain.     aspirin EC 81 MG tablet Take 81 mg by mouth daily.     atorvastatin (LIPITOR) 80 MG tablet TAKE ONE TABLET BY MOUTH DAILY 90 tablet 0    Cholecalciferol (VITAMIN D3) 25 MCG (1000 UT) CAPS Take 1,000 Units by mouth daily.     ciprofloxacin (CIPRO) 500 MG tablet Take 1 tablet (500 mg total) by mouth 2 (two) times daily. 10 tablet 0   escitalopram (LEXAPRO) 20 MG tablet Take 20 mg by mouth daily.     famotidine (PEPCID) 20 MG tablet Take 20 mg by mouth daily.     hydrALAZINE (APRESOLINE) 100 MG tablet Take 100 mg by mouth 2 (two) times daily.     loperamide (IMODIUM A-D) 2 MG tablet Take 2 mg by mouth 4 (four) times daily as needed for diarrhea or loose stools.     memantine (NAMENDA) 10 MG tablet Take 10 mg by mouth 2 (two) times daily.     metoprolol succinate (TOPROL-XL) 100 MG 24 hr tablet Take 1 tablet (100 mg total) by mouth 2 (two) times daily. Take with or immediately following a meal. 180 tablet 3   polyethylene glycol (MIRALAX / GLYCOLAX) 17 g packet Take 17 g by mouth 2 (two) times daily as needed for moderate constipation.     psyllium (REGULOID) 0.52 g capsule Take 0.52 g by mouth daily as needed (constipation).     rivastigmine (EXELON) 6 MG capsule Take 6 mg by mouth 2 (two) times daily.     tamsulosin (FLOMAX) 0.4 MG CAPS capsule Take 1 capsule (0.4 mg total) by mouth daily. 7 capsule 0   traMADol (ULTRAM) 50 MG tablet Take  1-2 tablets (50-100 mg total) by mouth every 6 (six) hours as needed for moderate pain. 15 tablet 0   traZODone (DESYREL) 100 MG tablet Take 200 mg by mouth at bedtime.     Zinc Oxide (TRIPLE PASTE) 12.8 % ointment Apply 1 Application topically See admin instructions. Apply to buttocks daily and after each incontinence episode *wipe gently, don't scrub, will leave film, this is desired protecting skin     hydrALAZINE (APRESOLINE) 50 MG tablet TAKE TWO TABLETS BY MOUTH IN THE MORNING AND AT BEDTIME (Patient not taking: Reported on 07/11/2023) 360 tablet 3   No facility-administered medications prior to visit.    PAST MEDICAL HISTORY: Past Medical History:  Diagnosis Date   Acute respiratory  failure (HCC) 2015   a. Following BG hemorrhage - s/p trache with decannulation.   Basal ganglia hemorrhage (HCC) 2015   a. 2015 - with intraventricular extension and hydrocephalus requiring right crani and VP shunt, complicated by encephalopathy, protein calorie malnutrition, acute resp failure req trache and PEG, BLE DVT s/p IVC, enterobacter PNA, subsequent right hemiparesis and difficulty communicating.   BPH (benign prostatic hyperplasia)    CAD (coronary artery disease)    a. s/p promus DES circumflex artery 10/13/10. b. Cath 04/2013: stable disease but possible small vessel disease -Imdur added.   Carotid artery disease (HCC)    a. 1-29% BICA by study 04/2014.   CVA (cerebral vascular accident) (HCC) 02/2010   a. CVA 2011, prior reported TIAs. b. CNS hemorrhage 2015.   CVA (cerebral vascular accident) (HCC) 06/2014   "hemorrhagic; in [place]; brain activity diminshed; ability to do anything; can't do ADLs on own"   DVT (deep venous thrombosis) (HCC) 2013   a. 2013. b. 2015 - reported BLE DVT s/p IVC filter.    Enterobacter cloacae pneumonia 2015   Falls    Folate deficiency anemia    GERD (gastroesophageal reflux disease)    Hematuria 07/2011   History of kidney stones    HLD (hyperlipidemia)    HTN (hypertension)    Hydrocephalus (HCC) 2015   a. 2/2 BG hemorrhage.   Incontinence of feces    Lower extremity edema    LV dysfunction    a. 2011 - EF 45-50%, subsequent 55-60% in 04/2014.   Noncompliance    Orthostatic hypotension    OSA on CPAP    "not using CPAP right now" (11/30/2014)   PFO (patent foramen ovale)    a. echo 8/12: EF 55-65%, grade 2 diast dysfnx; +PFO on bubble study. b. TEE 4/13: EF normal, atrial septum with suspicion for interatrial septum fenestrations without flow across and few large bubbles noted in LA.  This was not felt to require closure    Protein calorie malnutrition (HCC)    Rheumatoid arthritis(714.0)    S/P percutaneous endoscopic gastrostomy  (PEG) tube placement (HCC) 2015   TIA (transient ischemic attack)    "1-2; both after the one in 02/2010" (09/25/2013)   Urinary incontinence     PAST SURGICAL HISTORY: Past Surgical History:  Procedure Laterality Date   CORONARY ANGIOPLASTY WITH STENT PLACEMENT  12/10/2010   "1" (09/24/2013)   CYSTOSCOPY/URETEROSCOPY/HOLMIUM LASER/STENT PLACEMENT Right 07/18/2023   Procedure: CYSTOSCOPY RIGHT URETEROSCOPY, HOLMIUM LASER LITHOTRIPSY, AND RIGHT URETERAL STENT PLACEMENT;  Surgeon: Crist Fat, MD;  Location: [facility];  Service: Urology;  Laterality: Right;  90 MINUTES   filter in place for clots      per wife at preop on 01/11/23   KNEE ARTHROSCOPY Bilateral  08/11/1999   2 on left and 3 on rt   LAPAROSCOPIC NEPHRECTOMY Left 01/18/2023   Procedure: LAPAROSCOPIC RADICAL LEFT NEPHRECTOMY;  Surgeon: Crist Fat, MD;  Location: [facility];  Service: Urology;  Laterality: Left;  210 MINUTES NEDDED FOR CASE   LEFT HEART CATHETERIZATION WITH CORONARY ANGIOGRAM N/A 04/24/2013   Procedure: LEFT HEART CATHETERIZATION WITH CORONARY ANGIOGRAM;  Surgeon: Kathleene Hazel, MD;  Location[facility] C[facility] LAB;  Service: Cardiovascular;  Laterality: N/A;   shunt in head      TEE WITHOUT CARDIOVERSION  03/21/2012   Procedure: TRANSESOPHAGEAL ECHOCARDIOGRAM (TEE);  Surgeon: Pricilla Riffle, MD;  Location: [facility] ENDOSCOPY;  Service: Cardiovascular;  Laterality: N/A;    FAMILY HISTORY: Family History  Problem Relation Age of Onset   Heart attack Father    Stroke Father    Heart attack Sister    Hypertension Brother    Seizures Sister    Stroke Sister    Cancer Neg Hx    Kidney disease Neg Hx    Diabetes Neg Hx     SOCIAL HISTORY: Social History   Socioeconomic History   Marital status: Married    Spouse name: Not on file   Number of children: Not on file   Years of education: Not on file   Highest education level: Not on file  Occupational History   Not on file  Tobacco Use   Smoking status:  Former    Current packs/day: 0.00    Average packs/day: 1 pack/day for 35.0 years (35.0 ttl pk-yrs)    Types: Cigarettes    Start date: 03/10/1977    Quit date: 03/10/2012    Years since quitting: 11.3   Smokeless tobacco: Never  Vaping Use   Vaping status: Never Used  Substance and Sexual Activity   Alcohol use: No    Alcohol/week: 0.0 standard drinks of alcohol   Drug use: No   Sexual activity: Not Currently    Partners: Female  Other Topics Concern   Not on file  Social History Narrative   Married, 5 kids; lives in [place]. 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    Social Determinants of Health   Financial Resource Strain: Not on file  Food Insecurity: No Food Insecurity (01/19/2023)   Hunger Vital Sign    Worried About Running Out of Food in the Last Year: Never true    Ran Out of Food in the Last Year: Never true  Transportation Needs: No Transportation Needs (01/19/2023)   PRAPARE - Administrator, Civil Service (Medical): No    Lack of Transportation (Non-Medical): No  Physical Activity: Not on file  Stress: Not on file  Social Connections: Not on file  Intimate Partner Violence: Not At Risk (01/19/2023)   Humiliation, Afraid, Rape, and Kick questionnaire    Fear of Current or Ex-Partner: No    Emotionally Abused: No    Physically Abused: No    Sexually Abused: No     PHYSICAL EXAM  GENERAL EXAM/CONSTITUTIONAL: Vitals:  Vitals:   07/29/23 1521  BP: (!) 153/86  Pulse: 88  Weight: 244 lb (110.7 kg)  Height: 6' (1.829 m)    Body mass index is 33.09 kg/m. Wt Readings from Last 3 Encounters:  07/29/23 244 lb (110.7 kg)  07/18/23 245 lb 13 oz (111.5 kg)  07/12/23 245 lb 13 oz (111.5 kg)   Patient is in no distress; well developed, nourished and groomed; neck is  supple  CARDIOVASCULAR: Examination of carotid arteries is normal; no carotid bruits Regular rate and rhythm, no murmurs Examination of peripheral vascular system by  observation and palpation is normal  EYES: Ophthalmoscopic exam of optic discs and posterior segments is normal; no papilledema or hemorrhages No results found.  MUSCULOSKELETAL: Gait, strength, tone, movements noted in Neurologic exam below  NEUROLOGIC: MENTAL STATUS:     01/08/2023   11:22 AM 08/01/2016   10:07 AM  MMSE - Mini Mental State Exam  Not completed:  Unable to complete  Orientation to time 2 1  Orientation to Place 4 0  Registration 3 3  Attention/ Calculation 2 0  Recall 3 0  Language- name 2 objects 2 0  Language- repeat 1 1  Language- follow 3 step command 3 0  Language- read & follow direction 1 0  Write a sentence 0 0  Copy design 1 0  Total score 22 5   awake, SLEEPY, oriented to person   CRANIAL NERVE:  2nd, 3rd, 4th, 6th - pupils PINPOINT AND MINIMAL REACTION; visual fields full to confrontation, extraocular muscles intact, no nystagmus; BILATERAL PROPTOSIS; LEFT PTOSIS 5th - facial sensation symmetric 7th - facial strength --> DECR RIGHT NL FOLD 8th - hearing intact 9th - palate elevates symmetrically, uvula midline 11th - shoulder shrug symmetric 12th - tongue protrusion midline  MOTOR:  normal bulk and tone, full strength in the BUE, BLE  SENSORY:  normal and symmetric to light touch  COORDINATION:  finger-nose-finger, fine finger movements SLOW  REFLEXES:  deep tendon reflexes 1+ and symmetric  GAIT/STATION:  narrow based gait; USING WALKER     DIAGNOSTIC DATA (LABS, IMAGING, TESTING) - I reviewed patient records, labs, notes, testing and imaging myself where available.  Lab Results  Component Value Date   WBC 6.1 07/12/2023   HGB 13.4 07/12/2023   HCT 44.0 07/12/2023   MCV 96.5 07/12/2023   PLT 225 07/12/2023      Component Value Date/Time   NA 139 07/12/2023 0817   NA 142 04/18/2018 0929   K 3.1 (L) 07/12/2023 0817   CL 101 07/12/2023 0817   CO2 27 07/12/2023 0817   GLUCOSE 151 (H) 07/12/2023 0817   BUN 19  07/12/2023 0817   BUN 17 04/18/2018 0929   CREATININE 1.97 (H) 07/12/2023 0817   CREATININE 1.08 06/17/2015 1626   CALCIUM 9.1 07/12/2023 0817   PROT 6.6 09/21/2018 0157   ALBUMIN 3.6 09/21/2018 0157   AST 22 09/21/2018 0157   ALT 19 09/21/2018 0157   ALKPHOS 94 09/21/2018 0157   BILITOT 1.3 (H) 09/21/2018 0157   GFRNONAA 38 (L) 07/12/2023 0817   GFRAA >60 09/21/2018 0157   Lab Results  Component Value Date   CHOL 225 (H) 07/09/2016   HDL 37.50 (L) 07/09/2016   LDLCALC 64 02/11/2014   LDLDIRECT 145.0 07/09/2016   TRIG 343.0 (H) 07/09/2016   CHOLHDL 6 07/09/2016   Lab Results  Component Value Date   HGBA1C 5.7 (H) 09/21/2018   Lab Results  Component Value Date   VITAMINB12 191 09/21/2018   Lab Results  Component Value Date   TSH 0.684 09/21/2018    09/20/18 CT head [I reviewed images myself and agree with interpretation. -VRP]  1. No acute findings. No intracranial mass, hemorrhage or edema. Ventriculostomy shunt is stable in position, with stable size and configuration of the ventricles. 2. Chronic small vessel ischemic change in the white matter.  10/23/20 MRI brain 1. No acute intracranial  abnormality.  2. Chronic infarcts and chronic hemorrhages as above including a  remote left basal ganglia hemorrhage with chronic intraventricular  blood products.  3. New chronic microhemorrhages in the thalami.  4. Unchanged size of the ventricles with shunt in place.  5. Moderate chronic small vessel ischemic disease.     ASSESSMENT AND PLAN  62 y.o. year old male here with:   Dx:  1. Basal ganglia hemorrhage (HCC)   2. Mild vascular dementia without behavioral disturbance, psychotic disturbance, mood disturbance, or anxiety (HCC)   3. Spell of abnormal behavior   4. OSA (obstructive sleep apnea)      PLAN:  INTERMITTENT UNRESPONSIVE SPELLS (since ~2020; could be related to excessive fatigue related to untreated OSA vs sequelae fro mprior basal ganglia  intracerebral hemorrhage; also had one event of hypotension with these symptoms; no convulsions; usually can be aroused out of spells; unlikely to represent seizure) - unlikely to represent seizure; continue to monitor; will ask wife if able to arouse patient out of spell with physical stimulation or verbal stimulation - encouraged patient to restart CPAP of OSA  VASCULAR DEMENTIA (since 2015 left BG ICH; s/p right craniotomy and right VP shunt) - stable; continue med mgmt of BP, lipids - continue rivastigmine and memantine - continue supportive care via [facility]  VP shunt - stable; monitor  PERIOPERATIVE STROKE MGMT OF ANTIPLATELET (anticipated kidney surgery) - There may be a small risk of stroke by stopping antiplatelet or anticoagulation therapy in the peri-procedural period. If patient agrees to these risks, then patient may come off aspirin for 3 days in preparation for procedure, and restarted as soon as possible post-procedure.  Return for return to referring provider, pending if symptoms worsen or fail to improve.    Suanne Marker, MD 07/29/2023, 3:40 PM Certified in Neurology, Neurophysiology and Neuroimaging  [facility] [address] [place], [place] [address] [phone]

## 2023-08-19 ENCOUNTER — Telehealth: Payer: Self-pay | Admitting: Internal Medicine

## 2023-08-19 DIAGNOSIS — Z122 Encounter for screening for malignant neoplasm of respiratory organs: Secondary | ICD-10-CM

## 2023-08-19 DIAGNOSIS — Z87891 Personal history of nicotine dependence: Secondary | ICD-10-CM

## 2023-08-19 NOTE — Telephone Encounter (Signed)
[name] from [facility] is trying to return a call to [place].

## 2023-08-22 ENCOUNTER — Inpatient Hospital Stay: Admission: RE | Admit: 2023-08-22 | Payer: Medicare (Managed Care) | Source: Ambulatory Visit

## 2023-08-22 NOTE — Telephone Encounter (Signed)
Spoke with Shanda Bumps at [facility] and rescheduled LDCT for 09/06/23 3:20.

## 2023-09-06 ENCOUNTER — Inpatient Hospital Stay: Admission: RE | Admit: 2023-09-06 | Payer: Medicare (Managed Care) | Source: Ambulatory Visit

## 2023-09-19 ENCOUNTER — Emergency Department (HOSPITAL_COMMUNITY): Payer: Medicare (Managed Care)

## 2023-09-19 ENCOUNTER — Inpatient Hospital Stay (HOSPITAL_COMMUNITY)
Admission: EM | Admit: 2023-09-19 | Discharge: 2023-09-26 | DRG: 032 | Disposition: A | Discharge status: Skilled Nursing Facility | Payer: Medicare (Managed Care) | Attending: Internal Medicine | Admitting: Internal Medicine

## 2023-09-19 DIAGNOSIS — Z888 Allergy status to other drugs, medicaments and biological substances status: Secondary | ICD-10-CM

## 2023-09-19 DIAGNOSIS — Z91199 Patient's noncompliance with other medical treatment and regimen due to unspecified reason: Secondary | ICD-10-CM

## 2023-09-19 DIAGNOSIS — N183 Chronic kidney disease, stage 3 unspecified: Secondary | ICD-10-CM | POA: Diagnosis not present

## 2023-09-19 DIAGNOSIS — Z86718 Personal history of other venous thrombosis and embolism: Secondary | ICD-10-CM

## 2023-09-19 DIAGNOSIS — Z6832 Body mass index (BMI) 32.0-32.9, adult: Secondary | ICD-10-CM | POA: Diagnosis not present

## 2023-09-19 DIAGNOSIS — Z955 Presence of coronary angioplasty implant and graft: Secondary | ICD-10-CM

## 2023-09-19 DIAGNOSIS — Z66 Do not resuscitate: Secondary | ICD-10-CM | POA: Diagnosis present

## 2023-09-19 DIAGNOSIS — Z79899 Other long term (current) drug therapy: Secondary | ICD-10-CM

## 2023-09-19 DIAGNOSIS — I639 Cerebral infarction, unspecified: Secondary | ICD-10-CM | POA: Diagnosis not present

## 2023-09-19 DIAGNOSIS — E876 Hypokalemia: Secondary | ICD-10-CM | POA: Diagnosis present

## 2023-09-19 DIAGNOSIS — F32A Depression, unspecified: Secondary | ICD-10-CM | POA: Diagnosis present

## 2023-09-19 DIAGNOSIS — G4733 Obstructive sleep apnea (adult) (pediatric): Secondary | ICD-10-CM | POA: Diagnosis present

## 2023-09-19 DIAGNOSIS — N400 Enlarged prostate without lower urinary tract symptoms: Secondary | ICD-10-CM | POA: Diagnosis present

## 2023-09-19 DIAGNOSIS — Z95828 Presence of other vascular implants and grafts: Secondary | ICD-10-CM | POA: Diagnosis not present

## 2023-09-19 DIAGNOSIS — E782 Mixed hyperlipidemia: Secondary | ICD-10-CM | POA: Diagnosis not present

## 2023-09-19 DIAGNOSIS — E669 Obesity, unspecified: Secondary | ICD-10-CM | POA: Diagnosis present

## 2023-09-19 DIAGNOSIS — E785 Hyperlipidemia, unspecified: Secondary | ICD-10-CM | POA: Diagnosis present

## 2023-09-19 DIAGNOSIS — G919 Hydrocephalus, unspecified: Secondary | ICD-10-CM | POA: Diagnosis present

## 2023-09-19 DIAGNOSIS — F0153 Vascular dementia, unspecified severity, with mood disturbance: Secondary | ICD-10-CM | POA: Diagnosis present

## 2023-09-19 DIAGNOSIS — R29810 Facial weakness: Secondary | ICD-10-CM | POA: Diagnosis present

## 2023-09-19 DIAGNOSIS — M069 Rheumatoid arthritis, unspecified: Secondary | ICD-10-CM | POA: Diagnosis present

## 2023-09-19 DIAGNOSIS — I615 Nontraumatic intracerebral hemorrhage, intraventricular: Secondary | ICD-10-CM | POA: Diagnosis present

## 2023-09-19 DIAGNOSIS — I10 Essential (primary) hypertension: Secondary | ICD-10-CM | POA: Diagnosis not present

## 2023-09-19 DIAGNOSIS — N1832 Chronic kidney disease, stage 3b: Secondary | ICD-10-CM | POA: Diagnosis present

## 2023-09-19 DIAGNOSIS — I2510 Atherosclerotic heart disease of native coronary artery without angina pectoris: Secondary | ICD-10-CM | POA: Diagnosis present

## 2023-09-19 DIAGNOSIS — Z8701 Personal history of pneumonia (recurrent): Secondary | ICD-10-CM

## 2023-09-19 DIAGNOSIS — Z905 Acquired absence of kidney: Secondary | ICD-10-CM

## 2023-09-19 DIAGNOSIS — R1312 Dysphagia, oropharyngeal phase: Secondary | ICD-10-CM | POA: Diagnosis present

## 2023-09-19 DIAGNOSIS — K219 Gastro-esophageal reflux disease without esophagitis: Secondary | ICD-10-CM | POA: Diagnosis present

## 2023-09-19 DIAGNOSIS — R32 Unspecified urinary incontinence: Secondary | ICD-10-CM | POA: Diagnosis present

## 2023-09-19 DIAGNOSIS — I129 Hypertensive chronic kidney disease with stage 1 through stage 4 chronic kidney disease, or unspecified chronic kidney disease: Secondary | ICD-10-CM | POA: Diagnosis present

## 2023-09-19 DIAGNOSIS — N182 Chronic kidney disease, stage 2 (mild): Secondary | ICD-10-CM | POA: Diagnosis not present

## 2023-09-19 DIAGNOSIS — Z87891 Personal history of nicotine dependence: Secondary | ICD-10-CM | POA: Diagnosis not present

## 2023-09-19 DIAGNOSIS — I614 Nontraumatic intracerebral hemorrhage in cerebellum: Secondary | ICD-10-CM | POA: Diagnosis not present

## 2023-09-19 DIAGNOSIS — I69151 Hemiplegia and hemiparesis following nontraumatic intracerebral hemorrhage affecting right dominant side: Secondary | ICD-10-CM

## 2023-09-19 DIAGNOSIS — C649 Malignant neoplasm of unspecified kidney, except renal pelvis: Secondary | ICD-10-CM | POA: Diagnosis present

## 2023-09-19 DIAGNOSIS — Z87442 Personal history of urinary calculi: Secondary | ICD-10-CM

## 2023-09-19 DIAGNOSIS — Z91041 Radiographic dye allergy status: Secondary | ICD-10-CM

## 2023-09-19 DIAGNOSIS — Z515 Encounter for palliative care: Secondary | ICD-10-CM | POA: Diagnosis not present

## 2023-09-19 DIAGNOSIS — Z8249 Family history of ischemic heart disease and other diseases of the circulatory system: Secondary | ICD-10-CM

## 2023-09-19 DIAGNOSIS — Z7189 Other specified counseling: Secondary | ICD-10-CM | POA: Diagnosis not present

## 2023-09-19 DIAGNOSIS — E66811 Obesity, class 1: Secondary | ICD-10-CM | POA: Diagnosis present

## 2023-09-19 DIAGNOSIS — Z7982 Long term (current) use of aspirin: Secondary | ICD-10-CM

## 2023-09-19 DIAGNOSIS — Z823 Family history of stroke: Secondary | ICD-10-CM

## 2023-09-19 DIAGNOSIS — F0390 Unspecified dementia without behavioral disturbance: Secondary | ICD-10-CM | POA: Diagnosis not present

## 2023-09-19 DIAGNOSIS — H518 Other specified disorders of binocular movement: Secondary | ICD-10-CM | POA: Diagnosis present

## 2023-09-19 LAB — DIFFERENTIAL
Abs Immature Granulocytes: 0.02 10*3/uL (ref 0.00–0.07)
Basophils Absolute: 0.1 10*3/uL (ref 0.0–0.1)
Basophils Relative: 1 %
Eosinophils Absolute: 0.1 10*3/uL (ref 0.0–0.5)
Eosinophils Relative: 1 %
Immature Granulocytes: 0 %
Lymphocytes Relative: 10 %
Lymphs Abs: 1 10*3/uL (ref 0.7–4.0)
Monocytes Absolute: 0.4 10*3/uL (ref 0.1–1.0)
Monocytes Relative: 4 %
Neutro Abs: 7.9 10*3/uL — ABNORMAL HIGH (ref 1.7–7.7)
Neutrophils Relative %: 84 %

## 2023-09-19 LAB — I-STAT CHEM 8, ED
BUN: 19 mg/dL (ref 6–20)
Calcium, Ion: 1.05 mmol/L — ABNORMAL LOW (ref 1.15–1.40)
Chloride: 98 mmol/L (ref 98–111)
Creatinine, Ser: 1.8 mg/dL — ABNORMAL HIGH (ref 0.61–1.24)
Glucose, Bld: 138 mg/dL — ABNORMAL HIGH (ref 70–99)
HCT: 45 % (ref 39.0–52.0)
Hemoglobin: 15.3 g/dL (ref 13.0–17.0)
Potassium: 3.5 mmol/L (ref 3.5–5.1)
Sodium: 140 mmol/L (ref 135–145)
TCO2: 30 mmol/L (ref 22–32)

## 2023-09-19 LAB — COMPREHENSIVE METABOLIC PANEL
ALT: 30 U/L (ref 0–44)
AST: 21 U/L (ref 15–41)
Albumin: 3.8 g/dL (ref 3.5–5.0)
Alkaline Phosphatase: 152 U/L — ABNORMAL HIGH (ref 38–126)
Anion gap: 12 (ref 5–15)
BUN: 16 mg/dL (ref 6–20)
CO2: 29 mmol/L (ref 22–32)
Calcium: 9.2 mg/dL (ref 8.9–10.3)
Chloride: 98 mmol/L (ref 98–111)
Creatinine, Ser: 1.69 mg/dL — ABNORMAL HIGH (ref 0.61–1.24)
GFR, Estimated: 46 mL/min — ABNORMAL LOW (ref >60)
Glucose, Bld: 141 mg/dL — ABNORMAL HIGH (ref 70–99)
Potassium: 3.4 mmol/L — ABNORMAL LOW (ref 3.5–5.1)
Sodium: 139 mmol/L (ref 135–145)
Total Bilirubin: 1 mg/dL (ref 0.3–1.2)
Total Protein: 7 g/dL (ref 6.5–8.1)

## 2023-09-19 LAB — PROTIME-INR
INR: 1.1 (ref 0.8–1.2)
Prothrombin Time: 13.9 s (ref 11.4–15.2)

## 2023-09-19 LAB — CBC
HCT: 45.2 % (ref 39.0–52.0)
Hemoglobin: 14.6 g/dL (ref 13.0–17.0)
MCH: 30 pg (ref 26.0–34.0)
MCHC: 32.3 g/dL (ref 30.0–36.0)
MCV: 93 fL (ref 80.0–100.0)
Platelets: 244 10*3/uL (ref 150–400)
RBC: 4.86 MIL/uL (ref 4.22–5.81)
RDW: 12.7 % (ref 11.5–15.5)
WBC: 9.5 10*3/uL (ref 4.0–10.5)
nRBC: 0 % (ref 0.0–0.2)

## 2023-09-19 LAB — APTT: aPTT: 32 s (ref 24–36)

## 2023-09-19 LAB — ETHANOL: Alcohol, Ethyl (B): <10 mg/dL (ref <10)

## 2023-09-19 MED ORDER — CLEVIDIPINE BUTYRATE 0.5 MG/ML IV EMUL
0.0000 mg/h | INTRAVENOUS | Status: DC
  Administered 2023-09-19: 4 mg/h via INTRAVENOUS
  Administered 2023-09-19: 2 mg/h via INTRAVENOUS
  Administered 2023-09-20: 6 mg/h via INTRAVENOUS
  Administered 2023-09-20: 5 mg/h via INTRAVENOUS
  Administered 2023-09-21 (×2): 8 mg/h via INTRAVENOUS
  Administered 2023-09-21: 2 mg/h via INTRAVENOUS
  Administered 2023-09-22: 8 mg/h via INTRAVENOUS
  Administered 2023-09-22: 6 mg/h via INTRAVENOUS
  Filled 2023-09-19 (×8): qty 100

## 2023-09-19 MED ORDER — HYDRALAZINE HCL 20 MG/ML IJ SOLN: 5.0000 mg | INTRAMUSCULAR | Status: DC | PRN

## 2023-09-19 MED ORDER — SODIUM CHLORIDE 0.9% FLUSH
3.0000 mL | Freq: Once | INTRAVENOUS | Status: AC
  Administered 2023-09-19: 3 mL via INTRAVENOUS

## 2023-09-19 MED ORDER — SENNOSIDES-DOCUSATE SODIUM 8.6-50 MG PO TABS
1.0000 | ORAL_TABLET | Freq: Two times a day (BID) | ORAL | Status: DC
  Administered 2023-09-20: 1 via ORAL
  Filled 2023-09-19: qty 1

## 2023-09-19 MED ORDER — ACETAMINOPHEN 325 MG PO TABS
650.0000 mg | ORAL_TABLET | ORAL | Status: DC | PRN
  Administered 2023-09-20: 650 mg via ORAL
  Filled 2023-09-19: qty 2

## 2023-09-19 MED ORDER — ACETAMINOPHEN 160 MG/5ML PO SOLN: 650.0000 mg | ORAL | Status: DC | PRN

## 2023-09-19 MED ORDER — LABETALOL HCL 5 MG/ML IV SOLN
10.0000 mg | Freq: Once | INTRAVENOUS | Status: AC
  Administered 2023-09-19: 10 mg via INTRAVENOUS

## 2023-09-19 MED ORDER — STROKE: EARLY STAGES OF RECOVERY BOOK
Freq: Once | Status: AC
  Filled 2023-09-19: qty 1

## 2023-09-19 MED ORDER — PANTOPRAZOLE SODIUM 40 MG IV SOLR
40.0000 mg | Freq: Every day | INTRAVENOUS | Status: DC
  Administered 2023-09-20 – 2023-09-22 (×3): 40 mg via INTRAVENOUS
  Filled 2023-09-19 (×3): qty 10

## 2023-09-19 MED ORDER — ACETAMINOPHEN 650 MG RE SUPP: 650.0000 mg | RECTAL | Status: DC | PRN

## 2023-09-19 MED ORDER — LABETALOL HCL 5 MG/ML IV SOLN
10.0000 mg | INTRAVENOUS | Status: DC | PRN
  Administered 2023-09-20 (×2): 10 mg via INTRAVENOUS
  Filled 2023-09-19 (×4): qty 4

## 2023-09-19 NOTE — ED Triage Notes (Signed)
Pt lst known well was 1900, presents with forced deviation to the left

## 2023-09-19 NOTE — Code Documentation (Signed)
Stroke Response Nurse Documentation Code Documentation  Tony Davis. is a 62 y.o. male arriving to [facility]  via [facility] on 10/10 with past medical hx of IVH with shunt, HTN, HLD, GERD, OSA with CPAP. On No antithrombotic. Code stroke was activated by EMS.   Patient from home where he was LKW at 1900 and now complaining of left gaze preference, and slurred speech.   Stroke team at the bedside on patient arrival. Labs drawn and patient cleared for CT by Dr. Particia Nearing. Patient to CT with team. NIHSS 6, see documentation for details and code stroke times. Patient with disoriented, left gaze preference , right hemianopia, and dysarthria  on exam. The following imaging was completed:  CT Head. Patient is not a candidate for IV Thrombolytic due to Hemorrhage. Patient is not a candidate for IR due to Hemorrhage.   Care Plan: Cleviprex.   Bedside handoff with ED RN Pennie Rushing.    Rose Fillers  Rapid Response RN

## 2023-09-19 NOTE — ED Provider Notes (Addendum)
[place] EMERGENCY DEPARTMENT AT [facility] Provider Note   CSN: [id number] Arrival date & time: 09/19/23  2229  An emergency department physician performed an initial assessment on this suspected stroke patient at 2231.  History  Chief Complaint  Patient presents with   Code Stroke    Tony Davis. is a 62 y.o. male.  Pt is a 62 yo male with pmhx significant for gerd, hld, htn, cad, dvt, hx tia, ra, bph, and hx ich.  Pt was apparently normal until 1900 when he had an acute change with weakness and eye deviation to the left.    Code stroke called by EMS       Home Medications Prior to Admission medications   Medication Sig Start Date End Date Taking? Authorizing Provider  acetaminophen (TYLENOL) 325 MG tablet Take 650 mg by mouth every 6 (six) hours as needed for moderate pain.   Yes [provider]  aspirin EC 81 MG tablet Take 81 mg by mouth daily.   Yes [provider]  atorvastatin (LIPITOR) 80 MG tablet TAKE ONE TABLET BY MOUTH DAILY 08/16/18  Yes Etta Grandchild, MD  Cholecalciferol (VITAMIN D3) 25 MCG (1000 UT) CAPS Take 1,000 Units by mouth daily.   Yes [provider]  escitalopram (LEXAPRO) 20 MG tablet Take 20 mg by mouth daily.   Yes [provider]  famotidine (PEPCID) 20 MG tablet Take 20 mg by mouth daily.   Yes [provider]  hydrALAZINE (APRESOLINE) 100 MG tablet Take 100 mg by mouth 2 (two) times daily.   Yes [provider]  memantine (NAMENDA) 10 MG tablet Take 10 mg by mouth 2 (two) times daily. 10/30/21  Yes [provider]  metoprolol succinate (TOPROL-XL) 100 MG 24 hr tablet Take 1 tablet (100 mg total) by mouth 2 (two) times daily. Take with or immediately following a meal. 12/25/21  Yes McAlhany, Nile Dear, MD  polyethylene glycol (MIRALAX / GLYCOLAX) 17 g packet Take 17 g by mouth 2 (two) times daily as needed for moderate constipation.   Yes [provider]   rivastigmine (EXELON) 6 MG capsule Take 6 mg by mouth 2 (two) times daily. 11/11/21  Yes [provider]  traZODone (DESYREL) 100 MG tablet Take 200 mg by mouth at bedtime. 11/11/21  Yes [provider]      Allergies    Iodine, Norvasc [amlodipine], and Aricept [donepezil]    Review of Systems   Review of Systems  All other systems reviewed and are negative.   Physical Exam Updated Vital Signs BP 133/75   Pulse 75   Temp 98.3 F (36.8 C) (Oral)   Resp 15   Wt 108.7 kg   SpO2 94%   BMI 32.50 kg/m  Physical Exam Vitals and nursing note reviewed.  Constitutional:      Appearance: He is obese.  HENT:     Head: Normocephalic and atraumatic.     Right Ear: External ear normal.     Left Ear: External ear normal.     Nose: Nose normal.     Mouth/Throat:     Mouth: Mucous membranes are moist.     Pharynx: Oropharynx is clear.  Eyes:     Pupils: Pupils are equal, round, and reactive to light.     Comments: Left gaze pref, but will go midline  Cardiovascular:     Rate and Rhythm: Normal rate and regular rhythm.     Pulses: Normal  pulses.     Heart sounds: Normal heart sounds.  Pulmonary:     Effort: Pulmonary effort is normal.     Breath sounds: Normal breath sounds.  Abdominal:     General: Abdomen is flat. Bowel sounds are normal.     Palpations: Abdomen is soft.  Musculoskeletal:        General: Normal range of motion.     Cervical back: Normal range of motion and neck supple.  Skin:    General: Skin is warm.     Capillary Refill: Capillary refill takes less than 2 seconds.  Neurological:     Mental Status: He is alert.     Comments: Slurred speech, no drift  Psychiatric:        Mood and Affect: Mood normal.     ED Results / Procedures / Treatments   Labs (all labs ordered are listed, but only abnormal results are displayed) Labs Reviewed  DIFFERENTIAL - Abnormal; Notable for the following components:      Result Value   Neutro Abs 7.9  (*)    All other components within normal limits  COMPREHENSIVE METABOLIC PANEL - Abnormal; Notable for the following components:   Potassium 3.4 (*)    Glucose, Bld 141 (*)    Creatinine, Ser 1.69 (*)    Alkaline Phosphatase 152 (*)    GFR, Estimated 46 (*)    All other components within normal limits  GLUCOSE, CAPILLARY - Abnormal; Notable for the following components:   Glucose-Capillary 131 (*)    All other components within normal limits  GLUCOSE, CAPILLARY - Abnormal; Notable for the following components:   Glucose-Capillary 120 (*)    All other components within normal limits  GLUCOSE, CAPILLARY - Abnormal; Notable for the following components:   Glucose-Capillary 102 (*)    All other components within normal limits  I-STAT CHEM 8, ED - Abnormal; Notable for the following components:   Creatinine, Ser 1.80 (*)    Glucose, Bld 138 (*)    Calcium, Ion 1.05 (*)    All other components within normal limits  MRSA NEXT GEN BY PCR, NASAL  PROTIME-INR  APTT  CBC  ETHANOL  HIV ANTIBODY (ROUTINE TESTING W REFLEX)  MAGNESIUM  MAGNESIUM  PHOSPHORUS  PHOSPHORUS  CBG MONITORING, ED    EKG None  Radiology CT HEAD WO CONTRAST  Result Date: 09/20/2023 CLINICAL DATA:  62 year old male code stroke presentation yesterday. Deep right cerebellar nuclei hemorrhage. EXAM: CT HEAD WITHOUT CONTRAST TECHNIQUE: Contiguous axial images were obtained from the base of the skull through the vertex without intravenous contrast. RADIATION DOSE REDUCTION: This exam was performed according to the departmental dose-optimization program which includes automated exposure control, adjustment of the mA and/or kV according to patient size and/or use of iterative reconstruction technique. COMPARISON:  Head CT 09/19/2023. FINDINGS: Brain: Hyperdense hemorrhage centered in the medial right deep cerebellar nuclei tracking toward the dorsal brainstem measures 28 x 19 x 23 mm (AP by transverse by CC). Estimated  volume 6 mL. This is an increase of 1-2 mL since yesterday when measured using the same technique. No intraventricular or extra-axial extension of blood. Mild deep right cerebellar edema surrounding the blood products. No significant posterior fossa mass effect. Stable gray-white differentiation elsewhere. Stable ventricle size and configuration. Chronic right frontal approach CSF shunt. Vascular: Calcified atherosclerosis at the skull base. No suspicious intracranial vascular hyperdensity. Skull: Intact aside from right superior frontal burr hole for CSF shunt. Sinuses/Orbits: Visualized paranasal sinuses and mastoids  are stable and well aerated. Other: Chronic right side CSF shunt reservoir and catheter appears intact. Leftward gaze persists. IMPRESSION: 1. Small acute hemorrhage in the deep right cerebellar nuclei has increased by 1-2 mL since yesterday, now 6 mL. Mild surrounding edema. No extra-axial extension of blood or significant posterior fossa mass effect. 2. No new intracranial abnormality. Electronically Signed   By: Odessa Fleming M.D.   On: 09/20/2023 06:37   CT HEAD CODE STROKE WO CONTRAST  Result Date: 09/19/2023 CLINICAL DATA:  Code stroke. EXAM: CT HEAD WITHOUT CONTRAST TECHNIQUE: Contiguous axial images were obtained from the base of the skull through the vertex without intravenous contrast. RADIATION DOSE REDUCTION: This exam was performed according to the departmental dose-optimization program which includes automated exposure control, adjustment of the mA and/or kV according to patient size and/or use of iterative reconstruction technique. COMPARISON:  Prior study from 04/19/2023. FINDINGS: Brain: Age-related cerebral atrophy with chronic small vessel ischemic disease. Right frontal approach shunt catheter in place with tip in the right lateral ventricle. Stable ventricular size and morphology without hydrocephalus. Acute intraparenchymal hemorrhage centered at the right cerebellum measures 1.9  x 1.9 x 1.6 cm (estimated volume 3 mL). Mild localized edema without significant mass effect. Intraventricular extension with small volume blood within the adjacent fourth ventricle. No other acute intracranial hemorrhage. No other acute large vessel territory infarct. No mass lesion or midline shift. No extra-axial fluid collection. Vascular: No abnormal hyperdense vessel. Skull: Scalp soft tissues within normal limits.  Calvarium intact. Sinuses/Orbits: Left gaze preference noted. Paranasal sinuses are largely clear. No mastoid effusion. Other: None. ASPECTS [facility] Stroke Program Early CT Score) Acute ICH, does not apply. IMPRESSION: 1. Acute intraparenchymal hemorrhage centered at the right cerebellum measuring 1.9 x 1.9 x 1.6 cm (estimated volume 3 mL). Intraventricular extension with small volume blood within the adjacent fourth ventricle. No hydrocephalus. 2. Right frontal approach shunt catheter in place with tip in the right lateral ventricle. Stable ventricular size and morphology. 3. Underlying atrophy with chronic small vessel ischemic disease. These results were communicated to Dr. Iver Nestle at 10:48 pm on 09/19/2023 by text page via the [facility] messaging system. Electronically Signed   By: Rise Mu M.D.   On: 09/19/2023 22:52    Procedures Procedures    Medications Ordered in ED Medications  clevidipine (CLEVIPREX) infusion 0.5 mg/mL (5 mg/hr Intravenous Infusion Verify 09/20/23 1500)  acetaminophen (TYLENOL) tablet 650 mg (650 mg Oral Given 09/20/23 1106)    Or  acetaminophen (TYLENOL) 160 MG/5ML solution 650 mg ( Per Tube See Alternative 09/20/23 1106)    Or  acetaminophen (TYLENOL) suppository 650 mg ( Rectal See Alternative 09/20/23 1106)  senna-docusate (Senokot-S) tablet 1 tablet (1 tablet Oral Given 09/20/23 1106)  pantoprazole (PROTONIX) injection 40 mg (has no administration in time range)  labetalol (NORMODYNE) injection 10 mg (10 mg Intravenous Given 09/20/23 1112)   Chlorhexidine Gluconate Cloth 2 % PADS 6 each (6 each Topical Given 09/20/23 0543)  hydrALAZINE (APRESOLINE) injection 10 mg (has no administration in time range)  Oral care mouth rinse (has no administration in time range)  feeding supplement (PROSource TF20) liquid 60 mL (has no administration in time range)  feeding supplement (VITAL HIGH PROTEIN) liquid 1,000 mL (has no administration in time range)  metoprolol tartrate (LOPRESSOR) tablet 50 mg (has no administration in time range)  potassium chloride (KLOR-CON) packet 40 mEq (has no administration in time range)  feeding supplement (OSMOLITE 1.5 CAL) liquid 1,000 mL (has no administration in  time range)  sodium chloride flush (NS) 0.9 % injection 3 mL (3 mLs Intravenous Given 09/19/23 2313)  labetalol (NORMODYNE) injection 10 mg (10 mg Intravenous Given 09/19/23 2249)   stroke: early stages of recovery book ( Does not apply Given 09/20/23 1045)    ED Course/ Medical Decision Making/ A&P                                 Medical Decision Making Amount and/or Complexity of Data Reviewed Labs: ordered. Radiology: ordered.  Risk Decision regarding hospitalization.   This patient presents to the ED for concern of ams, this involves an extensive number of treatment options, and is a complaint that carries with it a high risk of complications and morbidity.  The differential diagnosis includes cva, ich, infection, electrolyte abn   Co morbidities that complicate the patient evaluation  gerd, hld, htn, cad, dvt, hx tia, ra, bph, and hx ich   Additional history obtained:  Additional history obtained from epic chart review External records from outside source obtained and reviewed including EMS report   Lab Tests:  I Ordered, and personally interpreted labs.  The pertinent results include:  cbc nl, cmp nl other than cr 1.69 (stable), etoh <10   Imaging Studies ordered:  I ordered imaging studies including ct head  I  independently visualized and interpreted imaging which showed   Acute intraparenchymal hemorrhage centered at the right  cerebellum measuring 1.9 x 1.9 x 1.6 cm (estimated volume 3 mL).  Intraventricular extension with small volume blood within the  adjacent fourth ventricle. No hydrocephalus.  2. Right frontal approach shunt catheter in place with tip in the  right lateral ventricle. Stable ventricular size and morphology.  3. Underlying atrophy with chronic small vessel ischemic disease.   I agree with the radiologist interpretation   Cardiac Monitoring:  The patient was maintained on a cardiac monitor.  I personally viewed and interpreted the cardiac monitored which showed an underlying rhythm of: nsr   Medicines ordered and prescription drug management:  I ordered medication including cleviprex  for htn  Reevaluation of the patient after these medicines showed that the patient improved I have reviewed the patients home medicines and have made adjustments as needed   Test Considered:  ct   Critical Interventions:  cleviprex   Consultations Obtained:  I requested consultation with the neurologist (Dr. Iver Nestle),  and discussed lab and imaging findings as well as pertinent plan - she will admit   Problem List / ED Course:  ICH: Cleviprex ordered. Pt to be adm to neuro icu   Reevaluation:  After the interventions noted above, I reevaluated the patient and found that they have :improved   Social Determinants of Health:  Lives at home   Dispostion:  After consideration of the diagnostic results and the patients response to treatment, I feel that the patent would benefit from admission.    CRITICAL CARE Performed by: Jacalyn Lefevre   Total critical care time: 30 minutes  Critical care time was exclusive of separately billable procedures and treating other patients.  Critical care was necessary to treat or prevent imminent or life-threatening  deterioration.  Critical care was time spent personally by me on the following activities: development of treatment plan with patient and/or surrogate as well as nursing, discussions with consultants, evaluation of patient's response to treatment, examination of patient, obtaining history from patient or surrogate, ordering and performing treatments  and interventions, ordering and review of laboratory studies, ordering and review of radiographic studies, pulse oximetry and re-evaluation of patient's condition.         Final Clinical Impression(s) / ED Diagnoses Final diagnoses:  Cerebellar hemorrhage [facility])    Rx / DC Orders ED Discharge Orders     None         Jacalyn Lefevre, MD 09/20/23 1608    Jacalyn Lefevre, MD 09/20/23 1611

## 2023-09-19 NOTE — H&P (Signed)
NEUROLOGY H&P NOTE   Date of service: September 19, 2023 Patient Name: Tony Davis. MRN:  [id number] DOB:  02-25-1961 Chief Complaint: Vomitting  History of Present Illness  Gryffin Maggart. is a right-handed man with a past medical history significant for hypertension, hyperlipidemia, left basal ganglia hemorrhage with intraventricular extension complicated by hydrocephalus s/p shunt (2015), obstructive sleep apnea on CPAP, BMI 33, rheumatoid arthritis on chart, vascular dementia, remote smoking, coronary artery disease, prediabetes  He was in his usual state of health today, had asked for dinner around 7 PM and was still fine when his wife brought it to him at about 7:30 PM.  However at 7:40 PM she heard him gagging that he was having emesis.  EMS was activated and noted he had worsening left gaze preference rapidly climbing blood pressure during transport.  No witnessed seizure activity.  Wife reports adherence with medications which are provided to her in a bubble pack for him.  No recent changes in his medication.  Last known well: 7:30 PM Modified rankin score: 3-Moderate disability-requires help but walks WITHOUT assistance (uses a walker only outdoors, ambulates without assistance within buildings) ICH Score: 2 tNKASE: Not offered due to ICH Thrombectomy: not offered due to ICH NIHSS components Score: Comment  1a Level of Conscious 0[x]  1[]  2[]  3[]      1b LOC Questions 0[]  1[]  2[x]       1c LOC Commands 0[x]  1[]  2[]       2 Best Gaze 0[]  1[x]  2[]       3 Visual 0[]  1[]  2[x]  3[]      4 Facial Palsy 0[x]  1[]  2[]  3[]      5a Motor Arm - left 0[x]  1[]  2[]  3[]  4[]  UN[]    5b Motor Arm - Right 0[x]  1[]  2[]  3[]  4[]  UN[]    6a Motor Leg - Left 0[x]  1[]  2[]  3[]  4[]  UN[]    6b Motor Leg - Right 0[x]  1[]  2[]  3[]  4[]  UN[]    7 Limb Ataxia 0[x]  1[]  2[]  3[]  UN[]     8 Sensory 0[x]  1[]  2[]  UN[]      9 Best Language 0[x]  1[]  2[]  3[]      10 Dysarthria 0[]  1[x]  2[]  UN[]      11 Extinct. and  Inattention 0[x]  1[]  2[]       TOTAL:       ROS   Unable to obtain review of system secondary to baseline memory impairment, no recent complaints or concerns per wife  Past History   Past Medical History:  Diagnosis Date   Acute respiratory failure (HCC) 2015   a. Following BG hemorrhage - s/p trache with decannulation.   Basal ganglia hemorrhage (HCC) 2015   a. 2015 - with intraventricular extension and hydrocephalus requiring right crani and VP shunt, complicated by encephalopathy, protein calorie malnutrition, acute resp failure req trache and PEG, BLE DVT s/p IVC, enterobacter PNA, subsequent right hemiparesis and difficulty communicating.   BPH (benign prostatic hyperplasia)    CAD (coronary artery disease)    a. s/p promus DES circumflex artery 10/13/10. b. Cath 04/2013: stable disease but possible small vessel disease -Imdur added.   Carotid artery disease (HCC)    a. 1-29% BICA by study 04/2014.   CVA (cerebral vascular accident) (HCC) 02/2010   a. CVA 2011, prior reported TIAs. b. CNS hemorrhage 2015.   CVA (cerebral vascular accident) (HCC) 06/2014   "hemorrhagic; in [place]; brain activity diminshed; ability to do anything; can't do ADLs on own"   DVT (deep venous thrombosis) (HCC)  2013   a. 2013. b. 2015 - reported BLE DVT s/p IVC filter.    Enterobacter cloacae pneumonia 2015   Falls    Folate deficiency anemia    GERD (gastroesophageal reflux disease)    Hematuria 07/2011   History of kidney stones    HLD (hyperlipidemia)    HTN (hypertension)    Hydrocephalus (HCC) 2015   a. 2/2 BG hemorrhage.   Incontinence of feces    Lower extremity edema    LV dysfunction    a. 2011 - EF 45-50%, subsequent 55-60% in 04/2014.   Noncompliance    Orthostatic hypotension    OSA on CPAP    "not using CPAP right now" (11/30/2014)   PFO (patent foramen ovale)    a. echo 8/12: EF 55-65%, grade 2 diast dysfnx; +PFO on bubble study. b. TEE 4/13: EF normal, atrial septum with  suspicion for interatrial septum fenestrations without flow across and few large bubbles noted in LA.  This was not felt to require closure    Protein calorie malnutrition (HCC)    Rheumatoid arthritis(714.0)    S/P percutaneous endoscopic gastrostomy (PEG) tube placement (HCC) 2015   TIA (transient ischemic attack)    "1-2; both after the one in 02/2010" (09/25/2013)   Urinary incontinence    Past Surgical History:  Procedure Laterality Date   CORONARY ANGIOPLASTY WITH STENT PLACEMENT  12/10/2010   "1" (09/24/2013)   CYSTOSCOPY/URETEROSCOPY/HOLMIUM LASER/STENT PLACEMENT Right 07/18/2023   Procedure: CYSTOSCOPY RIGHT URETEROSCOPY, HOLMIUM LASER LITHOTRIPSY, AND RIGHT URETERAL STENT PLACEMENT;  Surgeon: Crist Fat, MD;  Location: [facility];  Service: Urology;  Laterality: Right;  90 MINUTES   filter in place for clots      per wife at preop on 01/11/23   KNEE ARTHROSCOPY Bilateral 08/11/1999   2 on left and 3 on rt   LAPAROSCOPIC NEPHRECTOMY Left 01/18/2023   Procedure: LAPAROSCOPIC RADICAL LEFT NEPHRECTOMY;  Surgeon: Crist Fat, MD;  Location: [facility];  Service: Urology;  Laterality: Left;  210 MINUTES NEDDED FOR CASE   LEFT HEART CATHETERIZATION WITH CORONARY ANGIOGRAM N/A 04/24/2013   Procedure: LEFT HEART CATHETERIZATION WITH CORONARY ANGIOGRAM;  Surgeon: Kathleene Hazel, MD;  Location: [facility] CATH LAB;  Service: Cardiovascular;  Laterality: N/A;   shunt in head      TEE WITHOUT CARDIOVERSION  03/21/2012   Procedure: TRANSESOPHAGEAL ECHOCARDIOGRAM (TEE);  Surgeon: Pricilla Riffle, MD;  Location: [facility] ENDOSCOPY;  Service: Cardiovascular;  Laterality: N/A;   Current Outpatient Medications  Medication Instructions   acetaminophen (TYLENOL) 650 mg, Oral, Every 6 hours PRN   aspirin EC 81 mg, Oral, Daily   atorvastatin (LIPITOR) 80 MG tablet TAKE ONE TABLET BY MOUTH DAILY   ciprofloxacin (CIPRO) 500 mg, Oral, 2 times daily   escitalopram (LEXAPRO) 20 mg, Oral, Daily   famotidine  (PEPCID) 20 mg, Oral, Daily   hydrALAZINE (APRESOLINE) 100 mg, Oral, 2 times daily   loperamide (IMODIUM A-D) 2 mg, Oral, 4 times daily PRN   memantine (NAMENDA) 10 mg, Oral, 2 times daily   metoprolol succinate (TOPROL-XL) 100 mg, Oral, 2 times daily, Take with or immediately following a meal.   polyethylene glycol (MIRALAX / GLYCOLAX) 17 g, Oral, 2 times daily PRN   psyllium (REGULOID) 0.52 g, Oral, Daily PRN   rivastigmine (EXELON) 6 mg, Oral, 2 times daily   tamsulosin (FLOMAX) 0.4 mg, Oral, Daily   traMADol (ULTRAM) 50-100 mg, Oral, Every 6 hours PRN   traZODone (DESYREL) 200 mg, Oral,  Daily at bedtime   Vitamin D3 1,000 Units, Oral, Daily   Zinc Oxide (TRIPLE PASTE) 12.8 % ointment 1 Application, Topical, See admin instructions, Apply to buttocks daily and after each incontinence episode *wipe gently, don't scrub, will leave film, this is desired protecting skin     Family History  Problem Relation Age of Onset   Heart attack Father    Stroke Father    Heart attack Sister    Hypertension Brother    Seizures Sister    Stroke Sister    Cancer Neg Hx    Kidney disease Neg Hx    Diabetes Neg Hx    Social History   Socioeconomic History   Marital status: Married    Spouse name: Not on file   Number of children: Not on file   Years of education: Not on file   Highest education level: Not on file  Occupational History   Not on file  Tobacco Use   Smoking status: Former    Current packs/day: 0.00    Average packs/day: 1 pack/day for 35.0 years (35.0 ttl pk-yrs)    Types: Cigarettes    Start date: 03/10/1977    Quit date: 03/10/2012    Years since quitting: 11.5   Smokeless tobacco: Never  Vaping Use   Vaping status: Never Used  Substance and Sexual Activity   Alcohol use: No    Alcohol/week: 0.0 standard drinks of alcohol   Drug use: No   Sexual activity: Not Currently    Partners: Female  Other Topics Concern   Not on file  Social History Narrative   Married, 5  kids; lives in [place]. 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    Social Determinants of Health   Financial Resource Strain: Not on file  Food Insecurity: No Food Insecurity (01/19/2023)   Hunger Vital Sign    Worried About Running Out of Food in the Last Year: Never true    Ran Out of Food in the Last Year: Never true  Transportation Needs: No Transportation Needs (01/19/2023)   PRAPARE - Administrator, Civil Service (Medical): No    Lack of Transportation (Non-Medical): No  Physical Activity: Not on file  Stress: Not on file  Social Connections: Not on file   Allergies  Allergen Reactions   Iodine Anaphylaxis   Amlodipine Swelling and Other (See Comments)    Foot edema   Donepezil Other (See Comments)    Hallucinations,     Vitals  Current vital signs: BP (!) 145/86   Pulse 76   Resp 13   SpO2 98%  Vital signs in last 24 hours: Pulse Rate:  [64-78] 76 (10/10 2333) Resp:  [12-20] 13 (10/10 2333) BP: (145-177)/(86-101) 145/86 (10/10 2330) SpO2:  [96 %-100 %] 98 % (10/10 2333)   There is no height or weight on file to calculate BMI.  Physical Exam   Constitutional: Appears chronically ill Psych: Affect calm, cooperative Eyes: Bilateral proptosis, left more than right.  Some scleral injection which wife reports is normal for him HENT: No OP obstruction.  Cardiovascular: Normal rate and regular rhythm.  Respiratory: Effort normal, non-labored breathing GI: Soft.  No distension. There is no tenderness.  Skin: WDI.   Neurologic Examination  Mental status/Cognition: Alert, oriented to self, but not age or month Speech/language: Fluent, comprehension intact, object naming intact, repetition intact. Cranial nerves:   CN II Pupils pinpoint and minimally reactive at baseline  CN III,IV,VI Left gaze deviation but will get to midline with encouragement   CN V Facial sensation is symmetric to touch    CN VII Face is symmetric to movement   CN  VIII Hearing is intact to voice   CN IX & X normal palatal elevation, no uvular deviation    CN XII midline tongue with no atrophy or fasciculations.   Motor:  No drift in the bilateral upper or lower extremities  Sensation: Sensation is symmetric to light touch and temperature in the arms and legs.   Coordination/Complex Motor:  - FNF and HKS are intact bilaterally   Labs    Basic Metabolic Panel: Recent Labs  Lab 09/19/23 2233 09/19/23 2239  NA 139 140  K 3.4* 3.5  CL 98 98  CO2 29  --   GLUCOSE 141* 138*  BUN 16 19  CREATININE 1.69* 1.80*  CALCIUM 9.2  --     CBC: Recent Labs  Lab 09/19/23 2233 09/19/23 2239  WBC 9.5  --   NEUTROABS 7.9*  --   HGB 14.6 15.3  HCT 45.2 45.0  MCV 93.0  --   PLT 244  --    Lab Results  Component Value Date   HGBA1C 5.7 (H) 09/21/2018     Coagulation Studies: No results for input(s): "LABPROT", "INR" in the last 72 hours.     CT Head without contrast(Personally reviewed): 3 cm ICH at right cerebellum w/ IVH  Shunt in place    ECHO 06/06/2023  1. No LVOT obstruction. Left ventricular ejection fraction, by  estimation, is 60 to 65%. The left ventricle has normal function. The left  ventricle has no regional wall motion abnormalities. There is moderate  concentric left ventricular hypertrophy.  Left ventricular diastolic parameters are consistent with Grade I  diastolic dysfunction (impaired relaxation).   2. Right ventricular systolic function is normal. The right ventricular  size is normal.   3. The mitral valve is normal in structure. No evidence of mitral valve  regurgitation.   4. The aortic valve is grossly normal. Aortic valve regurgitation is not  visualized.   5. The inferior vena cava is normal in size with greater than 50%  respiratory variability, suggesting right atrial pressure of 3 mmHg.   Impression   Cari Voshell. is a 62 y.o. male with past medical history significant for hypertension,  hyperlipidemia, prior left basal ganglia hemorrhage with residual right hemiparesis and s/p shunt, OSA on CPAP, BMI 33, remote smoking, coronary artery disease  He is presenting with recurrent intracerebral hemorrhage  On goals of care discussion with family, they expressed significant uncertainty about CODE STATUS.  After extensive discussion they decided at this time they would like him to be full CODE STATUS but would like to have ongoing conversations and are considering whether DNR/DNI but otherwise full medical support may be a more appropriate CODE STATUS.    Primary Diagnosis: Nontraumatic intracerebral hemorrhage in cerebellum with intraventricular hemorrhage   Recommendations  # Hemorrhagic stroke, likely hypertensive, less likely hemorrhagic conversion of ischemic stroke, less likely underlying mass - Stroke labs HgbA1c, fasting lipid panel - MRI brain w/ and w/o when stabilized to eval for underlying mass  - MRA of the brain without contrast; unable to obtain CTA due to anaphylaxis to iodine dye - Stability scan in 6 hours - Frequent neuro checks, q1hr  - Echocardiogram performed within the last 6 months, will hold off on repeat for now - No antiplatelets  due to ICH - DVT PPx heparin at 24 hrs if stable, SCDs for now - Risk factor modification - Telemetry monitoring; 30 day event monitor on discharge if no arrythmias captured  - Blood pressure goal SBP 130-150, clevidipine and as needed labetalol and hydralazine ordered - PT consult, OT consult, Speech consult when patient stabilized  - Admitted to stroke team - Palliative care consultation at family request ______________________________________________________________________   Nunzio Cory MD-PhD [facility] [phone]  CRITICAL CARE Performed by: Gordy Councilman   Total critical care time: 70 minutes  Critical care time was exclusive of separately billable procedures and treating  other patients.  Critical care was necessary to treat or prevent imminent or life-threatening deterioration.  Critical care was time spent personally by me on the following activities: development of treatment plan with patient and/or surrogate as well as nursing, discussions with consultants, evaluation of patient's response to treatment, examination of patient, obtaining history from patient or surrogate, ordering and performing treatments and interventions, ordering and review of laboratory studies, ordering and review of radiographic studies, pulse oximetry and re-evaluation of patient's condition.

## 2023-09-20 ENCOUNTER — Inpatient Hospital Stay (HOSPITAL_COMMUNITY): Payer: Medicare (Managed Care)

## 2023-09-20 DIAGNOSIS — I614 Nontraumatic intracerebral hemorrhage in cerebellum: Secondary | ICD-10-CM | POA: Diagnosis not present

## 2023-09-20 LAB — HIV ANTIBODY (ROUTINE TESTING W REFLEX): HIV Screen 4th Generation wRfx: NONREACTIVE

## 2023-09-20 LAB — PHOSPHORUS: Phosphorus: 2.4 mg/dL — ABNORMAL LOW (ref 2.5–4.6)

## 2023-09-20 LAB — GLUCOSE, CAPILLARY
Glucose-Capillary: 102 mg/dL — ABNORMAL HIGH (ref 70–99)
Glucose-Capillary: 120 mg/dL — ABNORMAL HIGH (ref 70–99)
Glucose-Capillary: 125 mg/dL — ABNORMAL HIGH (ref 70–99)
Glucose-Capillary: 131 mg/dL — ABNORMAL HIGH (ref 70–99)
Glucose-Capillary: 132 mg/dL — ABNORMAL HIGH (ref 70–99)

## 2023-09-20 LAB — MRSA NEXT GEN BY PCR, NASAL: MRSA by PCR Next Gen: NOT DETECTED

## 2023-09-20 LAB — MAGNESIUM: Magnesium: 2 mg/dL (ref 1.7–2.4)

## 2023-09-20 MED ORDER — METOPROLOL TARTRATE 50 MG PO TABS
50.0000 mg | ORAL_TABLET | Freq: Two times a day (BID) | ORAL | Status: DC
  Administered 2023-09-20 – 2023-09-21 (×3): 50 mg
  Filled 2023-09-20 (×3): qty 1

## 2023-09-20 MED ORDER — SODIUM CHLORIDE 0.9 % IV SOLN: INTRAVENOUS | Status: DC

## 2023-09-20 MED ORDER — OSMOLITE 1.5 CAL PO LIQD
1000.0000 mL | ORAL | Status: DC
  Administered 2023-09-20: 1000 mL
  Filled 2023-09-20 (×2): qty 1000

## 2023-09-20 MED ORDER — SENNOSIDES-DOCUSATE SODIUM 8.6-50 MG PO TABS
1.0000 | ORAL_TABLET | Freq: Two times a day (BID) | ORAL | Status: DC
  Administered 2023-09-20 – 2023-09-22 (×4): 1
  Filled 2023-09-20 (×6): qty 1

## 2023-09-20 MED ORDER — K PHOS MONO-SOD PHOS DI & MONO 155-852-130 MG PO TABS
250.0000 mg | ORAL_TABLET | Freq: Once | ORAL | Status: AC
  Administered 2023-09-20: 250 mg
  Filled 2023-09-20: qty 1

## 2023-09-20 MED ORDER — CHLORHEXIDINE GLUCONATE CLOTH 2 % EX PADS
6.0000 | MEDICATED_PAD | Freq: Every day | CUTANEOUS | Status: DC
  Administered 2023-09-20 – 2023-09-26 (×6): 6 via TOPICAL

## 2023-09-20 MED ORDER — POTASSIUM CHLORIDE 20 MEQ PO PACK
40.0000 meq | PACK | Freq: Once | ORAL | Status: AC
  Administered 2023-09-20: 40 meq
  Filled 2023-09-20: qty 2

## 2023-09-20 MED ORDER — K PHOS MONO-SOD PHOS DI & MONO 155-852-130 MG PO TABS
250.0000 mg | ORAL_TABLET | Freq: Once | ORAL | Status: DC
  Filled 2023-09-20: qty 1

## 2023-09-20 MED ORDER — ORAL CARE MOUTH RINSE: 15.0000 mL | OROMUCOSAL | Status: DC | PRN

## 2023-09-20 MED ORDER — VITAL HIGH PROTEIN PO LIQD: 1000.0000 mL | ORAL | Status: DC

## 2023-09-20 MED ORDER — PROSOURCE TF20 ENFIT COMPATIBL EN LIQD
60.0000 mL | Freq: Every day | ENTERAL | Status: DC
  Administered 2023-09-20 – 2023-09-23 (×4): 60 mL
  Filled 2023-09-20 (×4): qty 60

## 2023-09-20 MED ORDER — HYDRALAZINE HCL 20 MG/ML IJ SOLN
10.0000 mg | INTRAMUSCULAR | Status: DC | PRN
  Administered 2023-09-20 – 2023-09-23 (×4): 10 mg via INTRAVENOUS
  Filled 2023-09-20 (×5): qty 1

## 2023-09-20 NOTE — Progress Notes (Signed)
OT Cancellation Note  Patient Details Name: Tony Davis. MRN: [id number] DOB: 1961/06/10   Cancelled Treatment:    Reason Eval/Treat Not Completed: Active bedrest order;Patient not medically ready (Will assess when activity orders updated.)  [facility] 09/20/2023, 6:41 AM Luisa Dago, OT/L   Acute OT Clinical Specialist Acute Rehabilitation Services Pager [phone] Office [phone]

## 2023-09-20 NOTE — Evaluation (Signed)
Speech Language Pathology Evaluation Patient Details Name: Tony Davis. MRN: [id number] DOB: [birth year] Today's Date: 09/20/2023 Time: 4098-1191 SLP Time Calculation (min) (ACUTE ONLY): 12 min  Problem List:  Patient Active Problem List   Diagnosis Date Noted   Cerebellar hemorrhage (HCC) 09/19/2023   Left renal mass 01/18/2023   Obesity (BMI 30-39.9) 04/21/2019   Altered mental status 09/20/2018   Hypokalemia 12/26/2015   Depression 02/03/2015   Diastolic dysfunction with chronic heart failure (HCC) 11/30/2014   Essential hypertension 11/03/2014   LV dysfunction    Hydrocephalus (HCC)    Basal ganglia hemorrhage (HCC) 07/30/2014   Routine general medical examination at a health care facility 02/11/2014   Insomnia, persistent 03/25/2013   Chronic rhinitis 03/25/2013   Hemiparesis due to old cerebral infarction (HCC) 03/20/2013   Folate deficiency anemia 10/13/2012   Long term (current) use of anticoagulants 03/28/2012   Obstructive sleep apnea 08/16/2011   Prediabetes 08/06/2011   PATENT FORAMEN OVALE 03/13/2010   CAD, NATIVE VESSEL 01/26/2010   Hyperlipidemia with target LDL less than 100 01/23/2010   GERD 01/23/2010   Past Medical History:  Past Medical History:  Diagnosis Date   Acute respiratory failure (HCC) 2015   a. Following BG hemorrhage - s/p trache with decannulation.   Basal ganglia hemorrhage (HCC) 2015   a. 2015 - with intraventricular extension and hydrocephalus requiring right crani and VP shunt, complicated by encephalopathy, protein calorie malnutrition, acute resp failure req trache and PEG, BLE DVT s/p IVC, enterobacter PNA, subsequent right hemiparesis and difficulty communicating.   BPH (benign prostatic hyperplasia)    CAD (coronary artery disease)    a. s/p promus DES circumflex artery 10/13/10. b. Cath 04/2013: stable disease but possible small vessel disease -Imdur added.   Carotid artery disease (HCC)    a. 1-29% BICA by study 04/2014.    CVA (cerebral vascular accident) (HCC) 02/2010   a. CVA 2011, prior reported TIAs. b. CNS hemorrhage 2015.   CVA (cerebral vascular accident) (HCC) 06/2014   "hemorrhagic; in [place]; brain activity diminshed; ability to do anything; can't do ADLs on own"   DVT (deep venous thrombosis) (HCC) 2013   a. 2013. b. 2015 - reported BLE DVT s/p IVC filter.    Enterobacter cloacae pneumonia 2015   Falls    Folate deficiency anemia    GERD (gastroesophageal reflux disease)    Hematuria 07/2011   History of kidney stones    HLD (hyperlipidemia)    HTN (hypertension)    Hydrocephalus (HCC) 2015   a. 2/2 BG hemorrhage.   Incontinence of feces    Lower extremity edema    LV dysfunction    a. 2011 - EF 45-50%, subsequent 55-60% in 04/2014.   Noncompliance    Orthostatic hypotension    OSA on CPAP    "not using CPAP right now" (11/30/2014)   PFO (patent foramen ovale)    a. echo 8/12: EF 55-65%, grade 2 diast dysfnx; +PFO on bubble study. b. TEE 4/13: EF normal, atrial septum with suspicion for interatrial septum fenestrations without flow across and few large bubbles noted in LA.  This was not felt to require closure    Protein calorie malnutrition (HCC)    Rheumatoid arthritis(714.0)    S/P percutaneous endoscopic gastrostomy (PEG) tube placement (HCC) 2015   TIA (transient ischemic attack)    "1-2; both after the one in 02/2010" (09/25/2013)   Urinary incontinence    Past Surgical History:  Past Surgical History:  Procedure  Laterality Date   CORONARY ANGIOPLASTY WITH STENT PLACEMENT  12/10/2010   "1" (09/24/2013)   CYSTOSCOPY/URETEROSCOPY/HOLMIUM LASER/STENT PLACEMENT Right 07/18/2023   Procedure: CYSTOSCOPY RIGHT URETEROSCOPY, HOLMIUM LASER LITHOTRIPSY, AND RIGHT URETERAL STENT PLACEMENT;  Surgeon: Crist Fat, MD;  Location: [facility];  Service: Urology;  Laterality: Right;  90 MINUTES   filter in place for clots      per wife at preop on 01/11/23   KNEE ARTHROSCOPY Bilateral  08/11/1999   2 on left and 3 on rt   LAPAROSCOPIC NEPHRECTOMY Left 01/18/2023   Procedure: LAPAROSCOPIC RADICAL LEFT NEPHRECTOMY;  Surgeon: Crist Fat, MD;  Location: [facility];  Service: Urology;  Laterality: Left;  210 MINUTES NEDDED FOR CASE   LEFT HEART CATHETERIZATION WITH CORONARY ANGIOGRAM N/A 04/24/2013   Procedure: LEFT HEART CATHETERIZATION WITH CORONARY ANGIOGRAM;  Surgeon: Kathleene Hazel, MD;  Location: [facility] CATH LAB;  Service: Cardiovascular;  Laterality: N/A;   shunt in head      TEE WITHOUT CARDIOVERSION  03/21/2012   Procedure: TRANSESOPHAGEAL ECHOCARDIOGRAM (TEE);  Surgeon: Pricilla Riffle, MD;  Location: [facility] ENDOSCOPY;  Service: Cardiovascular;  Laterality: N/A;   HPI:  Pt is a 62 y.o with a past medical history significant for hypertension, hyperlipidemia, left basal ganglia hemorrhage with intraventricular extension complicated by hydrocephalus s/p shunt (2015), obstructive sleep apnea on CPAP, BMI 33, rheumatoid arthritis on chart, vascular dementia, remote smoking, coronary artery disease, prediabetes.  Pt presents with hypertension and worsening left gaze preference. Head CT was remarkable for an acute intraparenchymal hemorrhage centered at the right cerebellum.  Pt with a hx of dysphagia and cognitive-linguistic deficits.   Assessment / Plan / Recommendation Clinical Impression  Pt presents with a cognitive-linguistic impairment with deficits observed in orientation, attention, short-term memory, problem solving, safety judgement, and executive functioning.  Pt was oriented only to self and was unable to correctly state year, place, or city.  Pt initially reported that he lived with his wife, but later stated that he doesn't live anywhere.  He additionally reported that he 6 children, three of whom are named Leonette Most.  He reported that he had 5 children to the RN earlier today.  Family was not present to establish a cognitive baseline; however, pt has a documented hx of a  cognitive impairment in his chart.  Recommend continued ST targeting deficits and full supervision/assistance for IADLs.    SLP Assessment  SLP Recommendation/Assessment: Patient needs continued Speech Lanaguage Pathology Services SLP Visit Diagnosis: Cognitive communication deficit (R41.841)    Recommendations for follow up therapy are one component of a multi-disciplinary discharge planning process, led by the attending physician.  Recommendations may be updated based on patient status, additional functional criteria and insurance authorization.    Follow Up Recommendations  Skilled nursing-short term rehab (<3 hours/day)    Assistance Recommended at Discharge  Frequent or constant Supervision/Assistance  Functional Status Assessment Patient has had a recent decline in their functional status and demonstrates the ability to make significant improvements in function in a reasonable and predictable amount of time.  Frequency and Duration min 2x/week  2 weeks      SLP Evaluation Cognition  Overall Cognitive Status: No family/caregiver present to determine baseline cognitive functioning Arousal/Alertness: Awake/alert Orientation Level: Oriented to person;Disoriented to place;Disoriented to time;Disoriented to situation Year: Other (Comment) (2010) Attention: Sustained;Focused Focused Attention: Impaired Focused Attention Impairment: Verbal basic;Functional basic Sustained Attention: Impaired Sustained Attention Impairment: Verbal basic;Functional basic Memory: Impaired Memory Impairment: Decreased short term memory Decreased  Short Term Memory: Verbal basic Awareness: Impaired Awareness Impairment: Emergent impairment Problem Solving: Appears intact Executive Function: Reasoning Reasoning: Impaired Reasoning Impairment: Verbal basic Safety/Judgment: Impaired       Comprehension       Expression Expression Primary Mode of Expression: Verbal   Oral / Motor  Oral  Motor/Sensory Function Overall Oral Motor/Sensory Function: Within functional limits Motor Speech Overall Motor Speech: Appears within functional limits for tasks assessed           Eino Farber, M.S., CCC-SLP Acute Rehabilitation Services Office: [phone]  Shanon Rosser [facility] 09/20/2023, 11:46 AM

## 2023-09-20 NOTE — Progress Notes (Signed)
eLink Physician-Brief Progress Note Patient Name: Tony Davis. DOB: [birth year] MRN: [id number]   Date of Service  09/20/2023  HPI/Events of Note  62 y.o. male arriving to [facility]  via [facility] on 10/10 with past medical hx of IVH with shunt, HTN, HLD, GERD, OSA with CPAP with active code stroke not a candidate for IV thrombolytic in the setting of hemorrhage.  Initially hypertensive into the 160s now with normal vitals saturating 95% on room air.  Metabolic panel with mild electrolyte disturbances, elevated creatinine, normal CBC.  Minimal hyperglycemia.  CT head with right cerebellar ICH 1.9 x 1.9 x 1.6 cm .  Right lateral ventricle shunt.  eICU Interventions  Strict SBP goal less than 150.  Clevidipine in place.  As needed labetalol and hydralazine.  Repeat CT head ordered.  GI prophylaxis with pantoprazole DVT prophylaxis with SCDs.  Chemoprophylaxis held in the setting of active bleeding     Intervention Category Evaluation Type: New Patient Evaluation  Tony Davis 09/20/2023, 2:36 AM

## 2023-09-20 NOTE — Procedures (Signed)
Person Inserting Tube:  Kendell Bane C, RD Tube Type:  Cortrak - 43 inches Tube Size:  10 Tube Location:  Left nare Secured by: Bridle Technique Used to Measure Tube Placement:  Marking at nare/corner of mouth Cortrak Secured At:  70 cm   Cortrak Tube Team Note:  Consult received to place a Cortrak feeding tube.   X-ray is required, abdominal x-ray has been ordered by the Cortrak team. Please confirm tube placement before using the Cortrak tube.   If the tube becomes dislodged please keep the tube and contact the Cortrak team at [email or web address] for replacement.  If after hours and replacement cannot be delayed, place a NG tube and confirm placement with an abdominal x-ray.    Cammy Copa., RD, LDN, CNSC See AMiON for contact information

## 2023-09-20 NOTE — Progress Notes (Signed)
PT Cancellation Note  Patient Details Name: Tony Davis. MRN: [id number] DOB: 1961/05/29   Cancelled Treatment:    Reason Eval/Treat Not Completed: Patient not medically ready;Medical issues which prohibited therapy Pt on bedrest until midnight tonight.  Will see 10/12 as able. 09/20/2023  Jacinto Halim., PT Acute Rehabilitation Services [phone]  (office)  Eliseo Gum Kwane Rohl 09/20/2023, 2:51 PM

## 2023-09-20 NOTE — Progress Notes (Addendum)
STROKE TEAM PROGRESS NOTE   BRIEF HPI [name]. is a 62 y.o. 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 New left-sided facial swelling. EMS reported increasing BP in transport.  CT scan on admission showed right cerebellar ICH 1.9 x 1.9 x 1.6 cm .  Right lateral ventricle shunt.   SIGNIFICANT HOSPITAL EVENTS 10/10 - admitted via ED to Cardiac ICU, CT Head as above 10/11 -   INTERIM HISTORY/SUBJECTIVE Met patient bedside in his room with attending physician Dr Viviann Spare and patient's nurse. Patient was able to give some of his history, but could not give many details. He reported coughing and having a significant headache. He reported nausea/vomiting yesterday, but improvement today. He reported facial swelling on his left side which is new. Pt seemed unaware of his left gaze preference.   OBJECTIVE  CBC    Component Value Date/Time   WBC 9.5 09/19/2023 2233   RBC 4.86 09/19/2023 2233   HGB 15.3 09/19/2023 2239   HCT 45.0 09/19/2023 2239   PLT 244 09/19/2023 2233   MCV 93.0 09/19/2023 2233   MCH 30.0 09/19/2023 2233   MCHC 32.3 09/19/2023 2233   RDW 12.7 09/19/2023 2233   LYMPHSABS 1.0 09/19/2023 2233   MONOABS 0.4 09/19/2023 2233   EOSABS 0.1 09/19/2023 2233   BASOSABS 0.1 09/19/2023 2233    BMET    Component Value Date/Time   NA 140 09/19/2023 2239   NA 142 04/18/2018 0929   K 3.5 09/19/2023 2239   CL 98 09/19/2023 2239   CO2 29 09/19/2023 2233   GLUCOSE 138 (H) 09/19/2023 2239   BUN 19 09/19/2023 2239   BUN 17 04/18/2018 0929   CREATININE 1.80 (H) 09/19/2023 2239   CREATININE 1.08 06/17/2015 1626    CALCIUM 9.2 09/19/2023 2233   GFRNONAA 46 (L) 09/19/2023 2233    IMAGING past 24 hours CT HEAD WO CONTRAST  Result Date: 09/20/2023 CLINICAL DATA:  62 year old male code stroke presentation yesterday. Deep right cerebellar nuclei hemorrhage. EXAM: CT HEAD WITHOUT CONTRAST TECHNIQUE: Contiguous axial images were obtained from the base of the skull through the vertex without intravenous contrast. RADIATION DOSE REDUCTION: This exam was performed according to the departmental dose-optimization program which includes automated exposure control, adjustment of the mA and/or kV according to patient size and/or use of iterative reconstruction technique. COMPARISON:  Head CT 09/19/2023. FINDINGS: Brain: Hyperdense hemorrhage centered in the medial right deep cerebellar nuclei tracking toward the dorsal brainstem measures 28 x 19 x 23 mm (AP by transverse by CC). Estimated volume 6 mL. This is an increase of 1-2 mL since yesterday when measured using the same technique. No intraventricular or extra-axial extension of blood. Mild deep right cerebellar edema surrounding the blood products. No significant posterior fossa mass effect. Stable gray-white differentiation elsewhere. Stable ventricle size and configuration. Chronic right frontal approach CSF shunt. Vascular: Calcified atherosclerosis at the skull base. No suspicious intracranial vascular hyperdensity. Skull: Intact aside from right superior frontal burr hole for CSF shunt. Sinuses/Orbits: Visualized paranasal sinuses and mastoids are stable and well aerated. Other: Chronic right side CSF shunt reservoir and catheter appears intact. Leftward gaze persists. IMPRESSION: 1. Small acute hemorrhage in the deep right cerebellar nuclei has increased  by 1-2 mL since yesterday, now 6 mL. Mild surrounding edema. No extra-axial extension of blood or significant posterior fossa mass effect. 2. No new intracranial abnormality. Electronically Signed   By: Odessa Fleming M.D.    On: 09/20/2023 06:37   CT HEAD CODE STROKE WO CONTRAST  Result Date: 09/19/2023 CLINICAL DATA:  Code stroke. EXAM: CT HEAD WITHOUT CONTRAST TECHNIQUE: Contiguous axial images were obtained from the base of the skull through the vertex without intravenous contrast. RADIATION DOSE REDUCTION: This exam was performed according to the departmental dose-optimization program which includes automated exposure control, adjustment of the mA and/or kV according to patient size and/or use of iterative reconstruction technique. COMPARISON:  Prior study from 04/19/2023. FINDINGS: Brain: Age-related cerebral atrophy with chronic small vessel ischemic disease. Right frontal approach shunt catheter in place with tip in the right lateral ventricle. Stable ventricular size and morphology without hydrocephalus. Acute intraparenchymal hemorrhage centered at the right cerebellum measures 1.9 x 1.9 x 1.6 cm (estimated volume 3 mL). Mild localized edema without significant mass effect. Intraventricular extension with small volume blood within the adjacent fourth ventricle. No other acute intracranial hemorrhage. No other acute large vessel territory infarct. No mass lesion or midline shift. No extra-axial fluid collection. Vascular: No abnormal hyperdense vessel. Skull: Scalp soft tissues within normal limits.  Calvarium intact. Sinuses/Orbits: Left gaze preference noted. Paranasal sinuses are largely clear. No mastoid effusion. Other: None. ASPECTS [facility] Stroke Program Early CT Score) Acute ICH, does not apply. IMPRESSION: 1. Acute intraparenchymal hemorrhage centered at the right cerebellum measuring 1.9 x 1.9 x 1.6 cm (estimated volume 3 mL). Intraventricular extension with small volume blood within the adjacent fourth ventricle. No hydrocephalus. 2. Right frontal approach shunt catheter in place with tip in the right lateral ventricle. Stable ventricular size and morphology. 3. Underlying atrophy with chronic small vessel  ischemic disease. These results were communicated to Dr. Iver Nestle at 10:48 pm on 09/19/2023 by text page via the [facility] messaging system. Electronically Signed   By: Rise Mu M.D.   On: 09/19/2023 22:52    Vitals:   09/20/23 0500 09/20/23 0600 09/20/23 0700 09/20/23 0800  BP: 136/80 125/70 124/79 124/77  Pulse: 71 70 72 74  Resp: (!) 21 (!) 21 16 (!) 9  Temp:      TempSrc:      SpO2: 94% 97% 96% 95%  Weight:         PHYSICAL EXAM General:  Alert, chronically-ill patient lying in bed. Leftward gazing. in no acute distress. Pt has prominent swelling on left side of face. Psych:  Mood "okay" and affect appropriate for situation CV: Regular rate and rhythm on monitor Respiratory:  Regular, unlabored respirations on room air GI: Abdomen soft and nontender   NEURO:  Mental Status: AA&Ox2, patient is able to give name and hospital. Not oriented to day, month, year. Speech/Language: speech is without dysarthria or aphasia.  Naming, repetition, fluency intact. Pt able to identify hand, pen, television, number of fingers.  Cranial Nerves:  II: PERRL. Visual fields weaker on left, but correctly identifies number of fingers. Leftward gazing III, IV, VI: EOMI. Eyelids elevate symmetrically.  V: Sensation is intact to light touch and symmetrical to face.  VII: Face shows some left facial droop at rest. Facial swelling on left. VIII: hearing intact to voice. Voices that finger-rub maneuver sounds are equal bilaterally. IX, X: Palate elevates symmetrically. Phonation is normal.  ZO:[id number] shrug 5/5. XII: tongue is midline without fasciculations. Motor: 5/5 strength to  all muscle groups tested.  Tone: is normal and bulk is normal Sensation- Intact to light touch bilaterally.  Coordination: FTN slow, but intact bilaterally, HKS: slow, but no ataxia in BLE.No drift.  Gait- deferred  ASSESSMENT/PLAN  Intraparenchymal Hemorrhage:  right cerebellum Etiology: TBD Code Stroke CT HEAD  WITHOUT CONTRAST  10/10 IMPRESSION:   1. Acute intraparenchymal hemorrhage centered at the right cerebellum measuring 1.9 x 1.9 x 1.6 cm (estimated volume 3 mL). Intraventricular extension with small volume blood within the adjacent fourth ventricle. No hydrocephalus. 2. Right frontal approach shunt catheter in place with tip in the right lateral ventricle. Stable ventricular size and morphology. 3. Underlying atrophy with chronic small vessel ischemic disease. CT head 10/11 IMPRESSION:    1. Small acute hemorrhage in the deep right cerebellar nuclei has increased by 1-2 mL since yesterday, now 6 mL. Mild surrounding edema. No extra-axial extension of blood or significant posterior fossa mass effect.  2. No new intracranial abnormality. MRI  Pending MRA  Pending 2D Echo Pending LDL No results found for requested labs within last 1095 days. HgbA1c No results found for requested labs within last 1095 days. VTE prophylaxis - Holding in setting of ICH aspirin 81 mg daily prior to admission, now on No antithrombotic for undetermined time. Therapy recommendations:  CIR and SNF Disposition:  TBD  Hx of Stroke/TIA Basal ganglia hemorrhage 2015 TIA 2011  Hypertension Home meds:  hydralazine, metoprolol Unstable, restarting metoprolol tartrate 50 bid, maintaining clevirpr Blood Pressure Goal: SBP less than 160   Hyperlipidemia Home meds:  atorvastatin, resumed in hospital LDL No results found for requested labs within last 1095 days., goal < 70 Lipid panel ordered Continue statin at discharge  Dysphagia Patient has post-stroke dysphagia, SLP consulted    Diet   Diet NPO time specified  Pt feeding tube placed, managed by nutrition Phosphorous replenished via K Phos   Other Stroke Risk Factors Obesity, Body mass index is 32.5 kg/m., BMI >/= 30 associated with increased stroke risk, recommend weight loss, diet and exercise as appropriate  Family hx stroke (father) Coronary artery  disease Obstructive sleep apnea, Not on CPAP at home  Other Active Problems Consult to palliative care placed Hypokalemia replace today Hypophosphatemia replaced today  Hospital day # 1  ATTENDING ATTESTATION:  62 year old with history of cerebral and cerebellar intracranial hemorrhage status post shunt placement since 2015.  Per his mother in the room he is essentially back to baseline except for left face that appears to be mildly swollen.  Repeat head CT stable with minimal blood in ventricles.  MRI is held up as we are not sure if shunt is compatible or programmable.  Discussed with radiology obtain 2 views of the skull as well as 1 view of the cervical spine, chest x-ray and abdomen for shunt series.  Once this is completed MRI may be possible.  Palliative care consult placed per family request.  There was some discussion about CODE STATUS, currently full code but continue to monitor.  Still on Cleviprex continue to wean.  Metoprolol added today unable to give XL due to feeding tube.  Dr. Viviann Spare evaluated pt independently, reviewed imaging, chart, labs. Discussed and formulated plan with the Resident/APP. Changes were made to the note where appropriate. Please see APP/resident note above for details.    This patient is critically ill due to  IVH and at significant risk of neurological worsening, death form heart failure, respiratory failure, recurrent stroke, bleeding from [facility], seizure, sepsis. This patient's  care requires constant monitoring of vital signs, hemodynamics, respiratory and cardiac monitoring, review of multiple databases, neurological assessment, discussion with family, other specialists and medical decision making of high complexity. I spent 35 minutes of neurocritical care time in the care of this patient.   Tony Petrovic,MD    To contact Stroke Continuity provider, please refer to [email or web address]. After hours, contact General Neurology

## 2023-09-20 NOTE — Evaluation (Signed)
Clinical/Bedside Swallow Evaluation Patient Details  Name: Tony Davis. MRN: [id number] Date of Birth: 1961-03-24  Today's Date: 09/20/2023 Time: SLP Start Time (ACUTE ONLY): 1102 SLP Stop Time (ACUTE ONLY): 1114 SLP Time Calculation (min) (ACUTE ONLY): 12 min  Past Medical History:  Past Medical History:  Diagnosis Date   Acute respiratory failure (HCC) 2015   a. Following BG hemorrhage - s/p trache with decannulation.   Basal ganglia hemorrhage (HCC) 2015   a. 2015 - with intraventricular extension and hydrocephalus requiring right crani and VP shunt, complicated by encephalopathy, protein calorie malnutrition, acute resp failure req trache and PEG, BLE DVT s/p IVC, enterobacter PNA, subsequent right hemiparesis and difficulty communicating.   BPH (benign prostatic hyperplasia)    CAD (coronary artery disease)    a. s/p promus DES circumflex artery 10/13/10. b. Cath 04/2013: stable disease but possible small vessel disease -Imdur added.   Carotid artery disease (HCC)    a. 1-29% BICA by study 04/2014.   CVA (cerebral vascular accident) (HCC) 02/2010   a. CVA 2011, prior reported TIAs. b. CNS hemorrhage 2015.   CVA (cerebral vascular accident) (HCC) 06/2014   "hemorrhagic; in [place]; brain activity diminshed; ability to do anything; can't do ADLs on own"   DVT (deep venous thrombosis) (HCC) 2013   a. 2013. b. 2015 - reported BLE DVT s/p IVC filter.    Enterobacter cloacae pneumonia 2015   Falls    Folate deficiency anemia    GERD (gastroesophageal reflux disease)    Hematuria 07/2011   History of kidney stones    HLD (hyperlipidemia)    HTN (hypertension)    Hydrocephalus (HCC) 2015   a. 2/2 BG hemorrhage.   Incontinence of feces    Lower extremity edema    LV dysfunction    a. 2011 - EF 45-50%, subsequent 55-60% in 04/2014.   Noncompliance    Orthostatic hypotension    OSA on CPAP    "not using CPAP right now" (11/30/2014)   PFO (patent foramen ovale)    a. echo  8/12: EF 55-65%, grade 2 diast dysfnx; +PFO on bubble study. b. TEE 4/13: EF normal, atrial septum with suspicion for interatrial septum fenestrations without flow across and few large bubbles noted in LA.  This was not felt to require closure    Protein calorie malnutrition (HCC)    Rheumatoid arthritis(714.0)    S/P percutaneous endoscopic gastrostomy (PEG) tube placement (HCC) 2015   TIA (transient ischemic attack)    "1-2; both after the one in 02/2010" (09/25/2013)   Urinary incontinence    Past Surgical History:  Past Surgical History:  Procedure Laterality Date   CORONARY ANGIOPLASTY WITH STENT PLACEMENT  12/10/2010   "1" (09/24/2013)   CYSTOSCOPY/URETEROSCOPY/HOLMIUM LASER/STENT PLACEMENT Right 07/18/2023   Procedure: CYSTOSCOPY RIGHT URETEROSCOPY, HOLMIUM LASER LITHOTRIPSY, AND RIGHT URETERAL STENT PLACEMENT;  Surgeon: Crist Fat, MD;  Location: [facility] ORS;  Service: Urology;  Laterality: Right;  90 MINUTES   filter in place for clots      per wife at preop on 01/11/23   KNEE ARTHROSCOPY Bilateral 08/11/1999   2 on left and 3 on rt   LAPAROSCOPIC NEPHRECTOMY Left 01/18/2023   Procedure: LAPAROSCOPIC RADICAL LEFT NEPHRECTOMY;  Surgeon: Crist Fat, MD;  Location: [facility] OR[facility];  Service: Urology;  Laterality: Left;  210 MINUTES NEDDED FOR CASE   LEFT HEART CATHETERIZATION WITH CORONARY ANGIOGRAM N/A 04/24/2013   Procedure: LEFT HEART CATHETERIZATION WITH CORONARY ANGIOGRAM;  Surgeon: Kathleene Hazel,  MD;  Location: [facility] CATH LAB;  Service: Cardiovascular;  Laterality: N/A;   shunt in head      TEE WITHOUT CARDIOVERSION  03/21/2012   Procedure: TRANSESOPHAGEAL ECHOCARDIOGRAM (TEE);  Surgeon: Pricilla Riffle, MD;  [facility] ENDOSCOPY;  Service: Cardiovascular;  Laterality: N/A;   HPI:  Pt is a 62 y.o with a past medical history significant for hypertension, hyperlipidemia, left basal ganglia hemorrhage with intraventricular extension complicated by hydrocephalus s/p shunt  (2015), obstructive sleep apnea on CPAP, BMI 33, rheumatoid arthritis on chart, vascular dementia, remote smoking, coronary artery disease, prediabetes.  Pt presents with hypertension and worsening left gaze preference. Head CT was remarkable for an acute intraparenchymal hemorrhage centered at the right cerebellum.  Pt with a hx of dysphagia and cognitive-linguistic deficits.    Assessment / Plan / Recommendation  Clinical Impression  Pt was seen for a bedside swallow evaluation and appears to have functional oropharyngeal swallowing abilities with suspected esophageal dysfunction.  Pt was seen with trials of thin liquid, puree, and regular solids.  He was noted to be impulsive with thin liquid intake, taking large serial sips despite cues for small sips.  Pt additionally consumed medication whole in liquid and puree during this session with RN.  No overt s/sx of aspiration were observed with PO intake.  Following PO trials (approximately 3 minutes after completion), pt began gagging and belching with subsequent multiple rounds of emesis. Therefore, recommend continuation of NPO at this time.  RN was in agreement.  When pt is ready to resume PO intake, recommend initiation of regular solids and thin liquids with meds whole in pure or with liquids given strict adherence to aspiration precautions.   SLP Visit Diagnosis: Dysphagia, pharyngoesophageal phase (R13.14)    Aspiration Risk  Mild aspiration risk    Diet Recommendation NPO (secondary to emesis; recommend regular solids and thin liquids when cleared to resume PO diet)    Liquid Administration via: Cup;Straw Medication Administration: Whole meds with puree Supervision: Patient able to self feed;Intermittent supervision to cue for compensatory strategies Compensations: Minimize environmental distractions;Slow rate;Small sips/bites Postural Changes: Remain upright for at least 30 minutes after po intake    Other  Recommendations Recommended  Consults: Consider GI evaluation;Consider esophageal assessment Oral Care Recommendations: Oral care BID    Recommendations for follow up therapy are one component of a multi-disciplinary discharge planning process, led by the attending physician.  Recommendations may be updated based on patient status, additional functional criteria and insurance authorization.  Follow up Recommendations Skilled nursing-short term rehab (<3 hours/day)      Assistance Recommended at Discharge    Functional Status Assessment Patient has had a recent decline in their functional status and demonstrates the ability to make significant improvements in function in a reasonable and predictable amount of time.  Frequency and Duration min 2x/week  2 weeks       Prognosis Prognosis for improved oropharyngeal function: Good Barriers to Reach Goals: Cognitive deficits      Swallow Study   General Date of Onset: 09/20/23 HPI: Pt is a 62 y.o with a past medical history significant for hypertension, hyperlipidemia, left basal ganglia hemorrhage with intraventricular extension complicated by hydrocephalus s/p shunt (2015), obstructive sleep apnea on CPAP, BMI 33, rheumatoid arthritis on chart, vascular dementia, remote smoking, coronary artery disease, prediabetes.  Pt presents with hypertension and worsening left gaze preference. Head CT was remarkable for an acute intraparenchymal hemorrhage centered at the right cerebellum.  Pt with a hx of  dysphagia and cognitive-linguistic deficits. Type of Study: Bedside Swallow Evaluation Previous Swallow Assessment: Most recent BSE on 12/01/14 Diet Prior to this Study: NPO Temperature Spikes Noted: No Respiratory Status: Room air History of Recent Intubation: No Behavior/Cognition: Alert;Cooperative;Pleasant mood;Confused Oral Cavity Assessment: Within Functional Limits Oral Care Completed by SLP: No Oral Cavity - Dentition: Dentures, top (partial dentures) Vision:  Functional for self-feeding Self-Feeding Abilities: Able to feed self Patient Positioning: Upright in bed Baseline Vocal Quality: Normal Volitional Cough: Strong Volitional Swallow: Able to elicit    Oral/Motor/Sensory Function Overall Oral Motor/Sensory Function: Within functional limits   Ice Chips Ice chips: Not tested   Thin Liquid Thin Liquid: Within functional limits Presentation: Straw    Nectar Thick Nectar Thick Liquid: Not tested   Honey Thick Honey Thick Liquid: Not tested   Puree Puree: Within functional limits Presentation: Spoon;Self Fed   Solid     Solid: Within functional limits Presentation: Self Fed     Eino Farber, M.S., CCC-SLP Acute Rehabilitation Services Office: [phone]  Shanon Rosser Tallula Grindle 09/20/2023,11:35 AM

## 2023-09-20 NOTE — TOC Initial Note (Signed)
Transition of Care (TOC) - Initial/Assessment Note    Patient Details  Name: Tony Davis. MRN: [id number] Date of Birth: 10/30/1961  Transition of Care [facility]) CM/SW Contact:    Elliot Cousin, RN Phone Number: [phone] 09/20/2023, 4:58 PM  Clinical Narrative:   CM spoke to pt's wife. Pt goes [employer] 3x per week. [facility] provide transportation to his appt. Wife states she would be agreeable to IP rehab or SNF rehab, states she works with [facility] with arranging services. Explained CM/CSW will work with [facility] to get service arranged prior to dc. Wife was interested in hospital bed.                 Expected Discharge Plan: Skilled Nursing Facility Barriers to Discharge: Continued Medical Work up   Patient Goals and CMS Choice Patient states their goals for this hospitalization and ongoing recovery are:: wants patient to get better CMS Medicare.gov Compare Post Acute Care list provided to:: Patient Represenative (must comment) Choice offered to / list presented to : Spouse      Expected Discharge Plan and Services   Discharge Planning Services: CM Consult Post Acute Care Choice: Skilled Nursing Facility Living arrangements for the past 2 months: Single Family Home                                      Prior Living Arrangements/Services Living arrangements for the past 2 months: Single Family Home Lives with:: Spouse, Adult Children Patient language and need for interpreter reviewed:: Yes Do you feel safe going back to the place where you live?: Yes      Need for Family Participation in Patient Care: Yes (Comment) Care giver support system in place?: Yes (comment) Current home services: DME (bedside commode, shower chair, rollator, rolling walker)    Activities of Daily Living      Permission Sought/Granted Permission sought to share information with : Case Manager, Family Supports, PCP Permission granted to share information with : Yes, Verbal  Permission Granted  Share Information with NAME: Cherene Julian  Permission granted to share info w AGENCY: [facility]  Permission granted to share info w Relationship: wife  Permission granted to share info w Contact Information: [phone]  Emotional Assessment Appearance:: Appears stated age     Orientation: : Oriented to Self   Psych Involvement: No (comment)  Admission diagnosis:  Cerebellar hemorrhage (HCC) [I61.4] Patient Active Problem List   Diagnosis Date Noted   Cerebellar hemorrhage (HCC) 09/19/2023   Left renal mass 01/18/2023   Obesity (BMI 30-39.9) 04/21/2019   Altered mental status 09/20/2018   Hypokalemia 12/26/2015   Depression 02/03/2015   Diastolic dysfunction with chronic heart failure (HCC) 11/30/2014   Essential hypertension 11/03/2014   LV dysfunction    Hydrocephalus (HCC)    Basal ganglia hemorrhage (HCC) 07/30/2014   Routine general medical examination at a health care facility 02/11/2014   Insomnia, persistent 03/25/2013   Chronic rhinitis 03/25/2013   Hemiparesis due to old cerebral infarction (HCC) 03/20/2013   Folate deficiency anemia 10/13/2012   Long term (current) use of anticoagulants 03/28/2012   Obstructive sleep apnea 08/16/2011   Prediabetes 08/06/2011   PATENT FORAMEN OVALE 03/13/2010   CAD, NATIVE VESSEL 01/26/2010   Hyperlipidemia with target LDL less than 100 01/23/2010   GERD 01/23/2010   PCP:  [employer] [facility] [employer]:  [facility] [id number] [place], [place] - [address] [place] [address] Phone: [phone] Fax: [phone]  [id number] - [place], [place] - [address] AT [facility] [address] [place] [address] Phone: [phone] Fax: [phone]  [id number] - [place], [place] - [address] AT [facility] [address] [facility] [address] [place] [address] Phone:  [phone] Fax: [phone]  [facility] - [place], [place] - [address] [place] [address] Phone: [phone] Fax: [phone]  [facility] [address] [place] [address] Phone: [phone] Fax: [phone]     Social Determinants of Health (SDOH) Social History: SDOH Screenings   Food Insecurity: No Food Insecurity (01/19/2023)  Housing: Low Risk  (01/19/2023)  Transportation Needs: No Transportation Needs (01/19/2023)  Utilities: Not At Risk (01/19/2023)  Tobacco Use: Medium Risk (07/29/2023)   SDOH Interventions:     Readmission Risk Interventions     No data to display

## 2023-09-21 ENCOUNTER — Inpatient Hospital Stay (HOSPITAL_COMMUNITY): Payer: Medicare (Managed Care)

## 2023-09-21 DIAGNOSIS — Z7189 Other specified counseling: Secondary | ICD-10-CM | POA: Diagnosis not present

## 2023-09-21 DIAGNOSIS — I614 Nontraumatic intracerebral hemorrhage in cerebellum: Secondary | ICD-10-CM | POA: Diagnosis not present

## 2023-09-21 DIAGNOSIS — Z515 Encounter for palliative care: Secondary | ICD-10-CM | POA: Diagnosis not present

## 2023-09-21 LAB — RAPID URINE DRUG SCREEN, HOSP PERFORMED
Amphetamines: NOT DETECTED
Barbiturates: NOT DETECTED
Benzodiazepines: NOT DETECTED
Cocaine: NOT DETECTED
Opiates: NOT DETECTED
Tetrahydrocannabinol: NOT DETECTED

## 2023-09-21 LAB — GLUCOSE, CAPILLARY
Glucose-Capillary: 105 mg/dL — ABNORMAL HIGH (ref 70–99)
Glucose-Capillary: 106 mg/dL — ABNORMAL HIGH (ref 70–99)
Glucose-Capillary: 115 mg/dL — ABNORMAL HIGH (ref 70–99)
Glucose-Capillary: 127 mg/dL — ABNORMAL HIGH (ref 70–99)
Glucose-Capillary: 129 mg/dL — ABNORMAL HIGH (ref 70–99)
Glucose-Capillary: 133 mg/dL — ABNORMAL HIGH (ref 70–99)

## 2023-09-21 LAB — MAGNESIUM: Magnesium: 2.1 mg/dL (ref 1.7–2.4)

## 2023-09-21 LAB — LIPID PANEL
Cholesterol: 80 mg/dL (ref 0–200)
HDL: 34 mg/dL — ABNORMAL LOW (ref >40)
LDL Cholesterol: 29 mg/dL (ref 0–99)
Total CHOL/HDL Ratio: 2.4 {ratio}
Triglycerides: 83 mg/dL (ref <150)
VLDL: 17 mg/dL (ref 0–40)

## 2023-09-21 LAB — CBC
HCT: 42.2 % (ref 39.0–52.0)
Hemoglobin: 14.3 g/dL (ref 13.0–17.0)
MCH: 30.6 pg (ref 26.0–34.0)
MCHC: 33.9 g/dL (ref 30.0–36.0)
MCV: 90.2 fL (ref 80.0–100.0)
Platelets: 245 10*3/uL (ref 150–400)
RBC: 4.68 MIL/uL (ref 4.22–5.81)
RDW: 13.2 % (ref 11.5–15.5)
WBC: 7.5 10*3/uL (ref 4.0–10.5)
nRBC: 0 % (ref 0.0–0.2)

## 2023-09-21 LAB — BASIC METABOLIC PANEL
Anion gap: 15 (ref 5–15)
BUN: 21 mg/dL — ABNORMAL HIGH (ref 6–20)
CO2: 27 mmol/L (ref 22–32)
Calcium: 9.8 mg/dL (ref 8.9–10.3)
Chloride: 99 mmol/L (ref 98–111)
Creatinine, Ser: 1.68 mg/dL — ABNORMAL HIGH (ref 0.61–1.24)
GFR, Estimated: 47 mL/min — ABNORMAL LOW (ref >60)
Glucose, Bld: 109 mg/dL — ABNORMAL HIGH (ref 70–99)
Potassium: 3.4 mmol/L — ABNORMAL LOW (ref 3.5–5.1)
Sodium: 141 mmol/L (ref 135–145)

## 2023-09-21 LAB — HEMOGLOBIN A1C
Hgb A1c MFr Bld: 5.8 % — ABNORMAL HIGH (ref 4.8–5.6)
Mean Plasma Glucose: 119.76 mg/dL

## 2023-09-21 LAB — PHOSPHORUS: Phosphorus: 2.8 mg/dL (ref 2.5–4.6)

## 2023-09-21 MED ORDER — HYDRALAZINE HCL 50 MG PO TABS
100.0000 mg | ORAL_TABLET | Freq: Two times a day (BID) | ORAL | Status: DC
  Filled 2023-09-21 (×2): qty 2

## 2023-09-21 MED ORDER — METOPROLOL TARTRATE 50 MG PO TABS
100.0000 mg | ORAL_TABLET | Freq: Two times a day (BID) | ORAL | Status: DC
  Administered 2023-09-21 – 2023-09-23 (×4): 100 mg
  Filled 2023-09-21 (×4): qty 2

## 2023-09-21 MED ORDER — ESCITALOPRAM OXALATE 10 MG PO TABS: 20.0000 mg | ORAL_TABLET | Freq: Every day | ORAL | Status: DC

## 2023-09-21 MED ORDER — MEMANTINE HCL 10 MG PO TABS: 10.0000 mg | ORAL_TABLET | Freq: Two times a day (BID) | ORAL | Status: DC

## 2023-09-21 MED ORDER — HYDRALAZINE HCL 50 MG PO TABS
100.0000 mg | ORAL_TABLET | Freq: Two times a day (BID) | ORAL | Status: DC
  Administered 2023-09-21 – 2023-09-22 (×3): 100 mg via ORAL
  Filled 2023-09-21 (×3): qty 2

## 2023-09-21 MED ORDER — OSMOLITE 1.5 CAL PO LIQD
1000.0000 mL | ORAL | Status: DC
  Administered 2023-09-21 – 2023-09-22 (×3): 1000 mL
  Filled 2023-09-21 (×3): qty 1000

## 2023-09-21 MED ORDER — QUETIAPINE FUMARATE 25 MG PO TABS
12.5000 mg | ORAL_TABLET | Freq: Once | ORAL | Status: AC | PRN
  Administered 2023-09-21: 12.5 mg via ORAL
  Filled 2023-09-21: qty 1

## 2023-09-21 MED ORDER — HYDRALAZINE HCL 100 MG PO TABS: 100.0000 mg | ORAL_TABLET | Freq: Two times a day (BID) | ORAL | Status: DC

## 2023-09-21 MED ORDER — POTASSIUM CHLORIDE 20 MEQ PO PACK
40.0000 meq | PACK | ORAL | Status: AC
  Administered 2023-09-21 (×2): 40 meq
  Filled 2023-09-21 (×2): qty 2

## 2023-09-21 MED ORDER — FREE WATER
200.0000 mL | Status: DC
  Administered 2023-09-21 – 2023-09-23 (×12): 200 mL

## 2023-09-21 MED ORDER — MEMANTINE HCL 10 MG PO TABS
10.0000 mg | ORAL_TABLET | Freq: Two times a day (BID) | ORAL | Status: DC
  Administered 2023-09-21 – 2023-09-23 (×5): 10 mg
  Filled 2023-09-21 (×7): qty 1

## 2023-09-21 MED ORDER — ESCITALOPRAM OXALATE 10 MG PO TABS
20.0000 mg | ORAL_TABLET | Freq: Every day | ORAL | Status: DC
  Administered 2023-09-21 – 2023-09-23 (×3): 20 mg
  Filled 2023-09-21 (×3): qty 2

## 2023-09-21 NOTE — Plan of Care (Signed)
  Problem: Education: Goal: Knowledge of disease or condition will improve Outcome: Progressing Goal: Knowledge of secondary prevention will improve (MUST DOCUMENT ALL) Outcome: Progressing Goal: Knowledge of patient specific risk factors will improve Loraine Leriche N/A or DELETE if not current risk factor) Outcome: Progressing   Problem: Intracerebral Hemorrhage Tissue Perfusion: Goal: Complications of Intracerebral Hemorrhage will be minimized Outcome: Progressing   Problem: Coping: Goal: Will verbalize positive feelings about self 09/21/2023 0711 by Nori Riis, RN Outcome: Progressing 09/21/2023 0711 by Nori Riis, RN Outcome: Progressing Goal: Will identify appropriate support needs Outcome: Progressing   Problem: Health Behavior/Discharge Planning: Goal: Ability to manage health-related needs will improve 09/21/2023 0711 by Nori Riis, RN Outcome: Progressing 09/21/2023 0711 by Nori Riis, RN Outcome: Progressing Goal: Goals will be collaboratively established with patient/family Outcome: Progressing   Problem: Self-Care: Goal: Ability to participate in self-care as condition permits will improve 09/21/2023 0711 by Nori Riis, RN Outcome: Progressing 09/21/2023 0711 by Nori Riis, RN Outcome: Progressing Goal: Verbalization of feelings and concerns over difficulty with self-care will improve Outcome: Progressing Goal: Ability to communicate needs accurately will improve Outcome: Progressing   Problem: Nutrition: Goal: Risk of aspiration will decrease Outcome: Progressing Goal: Dietary intake will improve Outcome: Progressing   Problem: Education: Goal: Knowledge of General Education information will improve Description: Including pain rating scale, medication(s)/side effects and non-pharmacologic comfort measures Outcome: Progressing   Problem: Health Behavior/Discharge Planning: Goal: Ability to manage  health-related needs will improve Outcome: Progressing   Problem: Clinical Measurements: Goal: Ability to maintain clinical measurements within normal limits will improve Outcome: Progressing Goal: Will remain free from infection Outcome: Progressing Goal: Diagnostic test results will improve Outcome: Progressing Goal: Respiratory complications will improve Outcome: Progressing Goal: Cardiovascular complication will be avoided Outcome: Progressing   Problem: Activity: Goal: Risk for activity intolerance will decrease Outcome: Progressing   Problem: Nutrition: Goal: Adequate nutrition will be maintained Outcome: Progressing   Problem: Coping: Goal: Level of anxiety will decrease Outcome: Progressing   Problem: Elimination: Goal: Will not experience complications related to bowel motility Outcome: Progressing Goal: Will not experience complications related to urinary retention Outcome: Progressing   Problem: Pain Managment: Goal: General experience of comfort will improve Outcome: Progressing   Problem: Safety: Goal: Ability to remain free from injury will improve Outcome: Progressing   Problem: Skin Integrity: Goal: Risk for impaired skin integrity will decrease Outcome: Progressing

## 2023-09-21 NOTE — Evaluation (Signed)
Physical Therapy Evaluation Patient Details Name: Tony Davis. MRN: [id number] DOB: [birth year] Today's Date: 09/21/2023  History of Present Illness  Pt is 62 year old presented to [facility] on  09/19/23 for weakness and lt gaze preference.Pt with acute intraparenchymal hemorrhage centered at the rt cerebellum. PMH - lt basal ganglia hemorrhage, hydrocephalus s/p shunt (2015), OSA, rheumatoid arthritis, vascular dementia, CAD  Clinical Impression  Pt admitted with above diagnosis and presents to PT with functional limitations due to deficits listed below (See PT problem list). Pt needs skilled PT to maximize independence and safety. Pt involved with [facility] program prior to admission. Pt with significant lt gaze preference throughout. Would turn head toward rt with verbal/tactile cues. Patient will benefit from intensive inpatient follow up therapy, >3 hours/day           If plan is discharge home, recommend the following: A lot of help with walking and/or transfers;A lot of help with bathing/dressing/bathroom;Direct supervision/assist for medications management;Direct supervision/assist for financial management;Assist for transportation   Can travel by private vehicle        Equipment Recommendations None recommended by PT  Recommendations for Other Services       Functional Status Assessment Patient has had a recent decline in their functional status and demonstrates the ability to make significant improvements in function in a reasonable and predictable amount of time.     Precautions / Restrictions        Mobility  Bed Mobility Overal bed mobility: Needs Assistance Bed Mobility: Rolling, Sidelying to Sit, Sit to Sidelying Rolling: +2 for safety/equipment, Mod assist Sidelying to sit: Mod assist, +2 for physical assistance, HOB elevated     Sit to sidelying: Min assist General bed mobility comments: Assist to bring legs over and bring attention to rt side of bed where he  was to sit up. Assist to bring legs off of bed and elevate trunk into sitting. Assist to bring legs back up into bed.    Transfers Overall transfer level: Needs assistance Equipment used: 2 person hand held assist Transfers: Sit to/from Stand Sit to Stand: +2 physical assistance, Min assist           General transfer comment: Assist to power up and for balance with pt having an initial posteior lean    Ambulation/Gait             Pre-gait activities: Side step up toward [facility] with +2 min assist with small shuffling steps and verbal/tactile cues.    Stairs            Wheelchair Mobility     Tilt Bed    Modified Rankin (Stroke Patients Only)       Balance Overall balance assessment: Needs assistance Sitting-balance support: Bilateral upper extremity supported, Feet supported Sitting balance-Leahy Scale: Poor Sitting balance - Comments: min to CGA for static sitting   Standing balance support: Bilateral upper extremity supported Standing balance-Leahy Scale: Poor Standing balance comment: BUE support and min assist for static standing                             Pertinent Vitals/Pain Pain Assessment Pain Assessment: No/denies pain    Home Living Family/patient expects to be discharged to:: Private residence Living Arrangements: Spouse/significant other Available Help at Discharge: Family;Available PRN/intermittently Type of Home: House           Home Equipment: Agricultural consultant (2 wheels);Cane - single point;Shower seat;Grab bars -  tub/shower;Hand held shower head;BSC/3in1 Additional Comments: Information from prior encounter    Prior Function Prior Level of Function : Needs assist;Patient poor historian/Family not available             Mobility Comments: Per notes pt amb without assistive device household distances and uses walker or w/c in the community       Extremity/Trunk Assessment   Upper Extremity Assessment Upper  Extremity Assessment: Defer to OT evaluation    Lower Extremity Assessment Lower Extremity Assessment: Generalized weakness       Communication   Communication Communication: No apparent difficulties  Cognition Arousal: Alert Behavior During Therapy: Flat affect Overall Cognitive Status: No family/caregiver present to determine baseline cognitive functioning Area of Impairment: Attention, Memory, Following commands, Safety/judgement, Awareness, Problem solving                   Current Attention Level: Sustained Memory: Decreased short-term memory Following Commands: Follows one step commands with increased time Safety/Judgement: Decreased awareness of safety, Decreased awareness of deficits Awareness: Intellectual Problem Solving: Slow processing, Requires verbal cues, Requires tactile cues, Difficulty sequencing          General Comments General comments (skin integrity, edema, etc.): VSS on RA    Exercises     Assessment/Plan    PT Assessment Patient needs continued PT services  PT Problem List Decreased strength;Decreased activity tolerance;Decreased balance;Decreased mobility;Decreased cognition;Decreased safety awareness;Decreased knowledge of precautions       PT Treatment Interventions DME instruction;Gait training;Functional mobility training;Therapeutic activities;Therapeutic exercise;Balance training;Neuromuscular re-education;Cognitive remediation;Patient/family education    PT Goals (Current goals can be found in the Care Plan section)  Acute Rehab PT Goals Patient Stated Goal: not stated PT Goal Formulation: Patient unable to participate in goal setting Time For Goal Achievement: 10/05/23 Potential to Achieve Goals: Fair    Frequency Min 1X/week     Co-evaluation               AM-PAC PT "6 Clicks" Mobility  Outcome Measure Help needed turning from your back to your side while in a flat bed without using bedrails?: A Lot Help needed  moving from lying on your back to sitting on the side of a flat bed without using bedrails?: Total Help needed moving to and from a bed to a chair (including a wheelchair)?: Total Help needed standing up from a chair using your arms (e.g., wheelchair or bedside chair)?: Total Help needed to walk in hospital room?: Total Help needed climbing 3-5 steps with a railing? : Total 6 Click Score: 7    End of Session   Activity Tolerance: Patient tolerated treatment well Patient left: in bed;with call bell/phone within reach;with nursing/sitter in room Nurse Communication: Mobility status (nurse present) PT Visit Diagnosis: Unsteadiness on feet (R26.81);Other abnormalities of gait and mobility (R26.89);Other symptoms and signs involving the nervous system (R29.898)    Time: 1610-9604 PT Time Calculation (min) (ACUTE ONLY): 17 min   Charges:   PT Evaluation $PT Eval Moderate Complexity: 1 Mod   PT General Charges $$ ACUTE PT VISIT: 1 Visit         [facility] PT Acute Rehabilitation Services Office [phone]   Angelina Ok [facility] 09/21/2023, 2:27 PM

## 2023-09-21 NOTE — Progress Notes (Signed)
Inpatient Rehab Admissions Coordinator:  ? ?Per therapy recommendations,  patient was screened for CIR candidacy by Devaney Segers, MS, CCC-SLP. At this time, Pt. Appears to be a a potential candidate for CIR. I will place   order for rehab consult per protocol for full assessment. Please contact me any with questions. ? ?Trine Fread, MS, CCC-SLP ?Rehab Admissions Coordinator  ?[phone] (celll) ?[phone] (office) ? ?

## 2023-09-21 NOTE — Consult Note (Signed)
Palliative Care Consult Note                                  Date: 09/21/2023   Patient Name: Tony Davis.  DOB: [birth year]  MRN: [id number]  Age / Sex: 62 y.o., male  [facility] Referring Physician: Stroke, Md, MD  Reason for Consultation: Establishing goals of care  HPI/Patient Profile: 62 y.o. male  with past medical history of hypertension, hyperlipidemia, left basal ganglia hemorrhage with intraventricular extension complicated by hydrocephalus s/p shunt (2015), obstructive sleep apnea on CPAP, BMI 33, rheumatoid arthritis on chart, vascular dementia, remote smoking, coronary artery disease, prediabetes admitted on 09/19/2023 with after vomiting and worsening left gaze preference. Also noted to have new left-sided facial swelling. EMS reported increasing BP in transport.   Head CT was remarkable for an acute intraparenchymal hemorrhage centered at the right cerebellum. Pt with a hx of dysphagia and cognitive-linguistic deficits.   Past Medical History:  Diagnosis Date   Acute respiratory failure (HCC) 2015   a. Following BG hemorrhage - s/p trache with decannulation.   Basal ganglia hemorrhage (HCC) 2015   a. 2015 - with intraventricular extension and hydrocephalus requiring right crani and VP shunt, complicated by encephalopathy, protein calorie malnutrition, acute resp failure req trache and PEG, BLE DVT s/p IVC, enterobacter PNA, subsequent right hemiparesis and difficulty communicating.   BPH (benign prostatic hyperplasia)    CAD (coronary artery disease)    a. s/p promus DES circumflex artery 10/13/10. b. Cath 04/2013: stable disease but possible small vessel disease -Imdur added.   Carotid artery disease (HCC)    a. 1-29% BICA by study 04/2014.   CVA (cerebral vascular accident) (HCC) 02/2010   a. CVA 2011, prior reported TIAs. b. CNS hemorrhage 2015.   CVA (cerebral vascular  accident) (HCC) 06/2014   "hemorrhagic; in [place]; brain activity diminshed; ability to do anything; can't do ADLs on own"   DVT (deep venous thrombosis) (HCC) 2013   a. 2013. b. 2015 - reported BLE DVT s/p IVC filter.    Enterobacter cloacae pneumonia 2015   Falls    Folate deficiency anemia    GERD (gastroesophageal reflux disease)    Hematuria 07/2011   History of kidney stones    HLD (hyperlipidemia)    HTN (hypertension)    Hydrocephalus (HCC) 2015   a. 2/2 BG hemorrhage.   Incontinence of feces    Lower extremity edema    LV dysfunction    a. 2011 - EF 45-50%, subsequent 55-60% in 04/2014.   Noncompliance    Orthostatic hypotension    OSA on CPAP    "not using CPAP right now" (11/30/2014)   PFO (patent foramen ovale)    a. echo 8/12: EF 55-65%, grade 2 diast dysfnx; +PFO on bubble study. b. TEE 4/13: EF normal, atrial septum with suspicion for interatrial septum fenestrations without flow across and few large bubbles noted in LA.  This was not felt to require closure    Protein calorie malnutrition (HCC)    Rheumatoid arthritis(714.0)    S/P percutaneous endoscopic gastrostomy (PEG) tube placement (HCC) 2015   TIA (transient ischemic attack)    "1-2; both after the one in 02/2010" (09/25/2013)   Urinary incontinence     Subjective:   I have reviewed medical records including EPIC notes, labs and imaging, received an update from nursing, assessed the  patient and then met with the patient's wife Valenica and son at bedside to discuss diagnosis prognosis, GOC, disposition and options.  I introduced Palliative Medicine as specialized medical care for people living with serious illness. It focuses on providing relief from symptoms and stress of a serious illness. The goal is to improve quality of life for both the patient and the family.  Today's Discussion: Patient was at bedside during discussion but was unable to participate in the conversation. The patient's wife has a  good understanding of the patient's chronic and acute illness. The patient has been married to his wife for 36 years. Prior to this hospitalization the patient was able to ambulate without assistance for short distances and used a walker or wheelchair for long distances. His appetite was good. His wife shares that since his last major stroke he has been similar to a dementia patient-- frequently re asking questions and little sense of safety awareness. The patient's son shares that his father often does not realize he is his son-- thinking he is his brother. The patient uses services from [facility] which helps the family.  We discussed code status and scope of care.Recommended consideration of DNR status, understanding evidenced-based poor outcomes in similar hospitalized patients, as the cause of the arrest is likely associated with chronic/terminal disease rather than a reversible acute cardio-pulmonary event. Family is in agreement to change code status to Do Not Attempt Resuscitation. We discussed scope of care and family would like to keep the patient full scope of care.  Discussed the importance of continued conversation with family and the medical providers regarding overall plan of care and treatment options, ensuring decisions are within the context of the patient's values and GOCs.  Questions and concerns were addressed. Hard Choices booklet left for review. The family was encouraged to call with questions or concerns. PMT will continue to support holistically.  Review of Systems  Unable to perform ROS   Objective:   Primary Diagnoses: Present on Admission:  Cerebellar hemorrhage (HCC)   Physical Exam Constitutional:      Comments: cortrak  Cardiovascular:     Rate and Rhythm: Normal rate.  Pulmonary:     Effort: Pulmonary effort is normal.  Skin:    General: Skin is warm and dry.  Neurological:     Mental Status: He is alert. He is disoriented.     Vital Signs:  BP 122/73    Pulse 71   Temp 97.7 F (36.5 C) (Oral)   Resp (!) 7   Wt 108.7 kg   SpO2 92%   BMI 32.50 kg/m    Advanced Care Planning:   Existing Vynca/ACP Documentation: None  Primary Decision Maker: NEXT OF KIN. Patient's wife Gant Gustason is the Management consultant.  Code Status/Advance Care Planning: DNR  Decisions/Changes to ACP: Code status changed to DNR.   Assessment & Plan:   SUMMARY OF RECOMMENDATIONS   DNR Full scope of treatment Continued PMT support   Discussed with: bedside RN  Time Total: 60 minutes  Thank you for allowing [place] to participate in the care of [name]. PMT will continue to support holistically.   Signed by: Sarina Ser, NP Palliative Medicine Team  Team Phone # [phone] (Nights/Weekends)  09/21/2023, 12:49 PM

## 2023-09-21 NOTE — Progress Notes (Addendum)
1938Rollene Fare, MD paged d/t pt refusal of stroke assessment. MD aware.  1945: Pt refused for CNA to check blood sugar and to assess temperature. MD aware.   1950: See new orders  1955: Hemolyzed phosphorus and magnesium per lab. Updated lab that pt is a lab stick. MD made aware.  2015: Rapid RN contacted to help w/ stroke assessment. See chart for further details of assessment.

## 2023-09-21 NOTE — Progress Notes (Signed)
Per nursing patient is being aggressive and yelling. refusing to answer any questions and to be touched (blood sugar, temp, assessment) -- vascular dementia at baseline and on 2 days of q1hr neuro checks  Repeat Head CT personally reviewed, agree with radiology (discussed verbally), stable scan   Qtc 475 yesterday  Overnight will relax neurochecks to q4hrs given scan stability  and severe agitated delirium If Qtc < 450, one time dose of seroquel 12.5 mg   Brooke Dare MD-PhD [facility] [phone] Available 7 PM to 7 AM, outside of these hours please call Neurologist on call as listed on Amion.

## 2023-09-21 NOTE — Progress Notes (Signed)
STROKE TEAM PROGRESS NOTE   BRIEF HPI [name]. is a 62 y.o. 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 New left-sided facial swelling. EMS reported increasing BP in transport.  CT scan on admission showed right cerebellar ICH 1.9 x 1.9 x 1.6 cm .  Right lateral ventricle shunt.   SIGNIFICANT HOSPITAL EVENTS 10/10 - admitted via ED to Cardiac ICU, CT Head as above  INTERIM HISTORY/SUBJECTIVE RN at the bedside.  Patient lying in bed, sleepy but arousable.  However, patient agitated when I try to examine him and asked me to leave him alone and get out.   OBJECTIVE  CBC    Component Value Date/Time   WBC 7.5 09/21/2023 0838   RBC 4.68 09/21/2023 0838   HGB 14.3 09/21/2023 0838   HCT 42.2 09/21/2023 0838   PLT 245 09/21/2023 0838   MCV 90.2 09/21/2023 0838   MCH 30.6 09/21/2023 0838   MCHC 33.9 09/21/2023 0838   RDW 13.2 09/21/2023 0838   LYMPHSABS 1.0 09/19/2023 2233   MONOABS 0.4 09/19/2023 2233   EOSABS 0.1 09/19/2023 2233   BASOSABS 0.1 09/19/2023 2233    BMET    Component Value Date/Time   NA 141 09/21/2023 0606   NA 142 04/18/2018 0929   K 3.4 (L) 09/21/2023 0606   CL 99 09/21/2023 0606   CO2 27 09/21/2023 0606   GLUCOSE 109 (H) 09/21/2023 0606   BUN 21 (H) 09/21/2023 0606   BUN 17 04/18/2018 0929   CREATININE 1.68 (H) 09/21/2023 0606   CREATININE 1.08 06/17/2015 1626   CALCIUM 9.8 09/21/2023 0606   GFRNONAA 47 (L) 09/21/2023 0606    IMAGING past 24 hours DG Abd 1 View  Result Date: 09/20/2023 CLINICAL DATA:  Status post VP shunt. EXAM: ABDOMEN - 1 VIEW COMPARISON:  09/20/2023. FINDINGS:  The bowel gas pattern is normal. VP shunt tubing courses over the right abdomen and terminates in the right lower quadrant. Tubing appears intact. An enteric tube terminates in the distal stomach/proximal duodenum. An IVC filter is noted on the right. IMPRESSION: 1. Status post VP shunt placement with shunt tubing terminating in the right lower quadrant. 2. Stable enteric tube. Electronically Signed   By: Thornell Sartorius M.D.   On: 09/20/2023 22:43   DG Chest 1 View  Result Date: 09/20/2023 CLINICAL DATA:  Status post VP shunt. EXAM: CHEST  1 VIEW COMPARISON:  05/08/2023. FINDINGS: The heart size and mediastinal contours are within normal limits. Both lungs are clear. No acute osseous abnormality. A VP shunt courses over the cervical and thoracic region on the right. Shunt tubing appears continuous. An enteric tube is noted. IMPRESSION: Right-sided VP shunt tubing appears intact. Electronically Signed   By: Thornell Sartorius M.D.   On: 09/20/2023 22:40   DG Cerv Spine 1V clearing  Result Date: 09/20/2023 CLINICAL DATA:  Status post VP shunt. EXAM: LIMITED CERVICAL SPINE FOR TRAUMA CLEARING - 1 VIEW COMPARISON:  None Available. FINDINGS: A single AP view of the cervical soft tissues was obtained. A ventriculoperitoneal shunt courses over the cervical soft tissues and chest on the right. VP shunt tubing appears continuous. IMPRESSION: Right VP shunt tubing appears intact. Electronically Signed   By: Vernona Rieger  Ladona Ridgel M.D.   On: 09/20/2023 22:39   DG Skull 1-3 Views  Result Date: 09/20/2023 CLINICAL DATA:  Status post VP shunt. EXAM: SKULL - 1-3 VIEW COMPARISON:  None Available. FINDINGS: There is no evidence of skull fracture or other focal bone lesions. A right ventral particular peritoneal shunt enters the frontal bone on the right and terminates to the right of midline. The visualized shunt tubing appears continuous. A nasogastric tube is noted. IMPRESSION: Right ventriculoperitoneal shunt catheter.  Electronically Signed   By: Thornell Sartorius M.D.   On: 09/20/2023 22:38    Vitals:   09/21/23 1630 09/21/23 1713 09/21/23 1715 09/21/23 1730  BP:  139/75 (!) 145/81 (!) 149/95  Pulse:   72 85  Resp:   15 16  Temp: 98.3 F (36.8 C)     TempSrc: Axillary     SpO2:   93% 96%  Weight:         PHYSICAL EXAM  Temp:  [97.7 F (36.5 C)-98.6 F (37 C)] 98.3 F (36.8 C) (10/12 1630) Pulse Rate:  [61-98] 85 (10/12 1730) Resp:  [0-25] 16 (10/12 1730) BP: (119-163)/(69-110) 149/95 (10/12 1730) SpO2:  [85 %-100 %] 96 % (10/12 1730) Weight:  [108.7 kg] 108.7 kg (10/12 0709)  General - Well nourished, well developed, in no apparent distress, mildly drowsy sleepy.  Ophthalmologic - fundi not visualized due to noncooperation.  Cardiovascular - Regular rhythm and rate.  Neuro - drowsy sleepy but arousable, eyes open on voice, but agitated not answer orientation questions. No aphasia, although paucity of speech but fluent, not cooperative with exam, name or repeat.  Left forced gaze, not crossing midline, but seems no hemianopia, PERRL.  Right mild facial droop. Tongue protrusion not corporative. Bilateral UEs 5/5, no drift. Bilaterally LEs 3/5 symmetrical. Sensation, coordination not corporative and gait not tested.  ASSESSMENT/PLAN  ICH - right cerebellum small ICH, etiology likely due to hypertensive Code Stroke CT HEAD Acute intraparenchymal hemorrhage centered at the right cerebellum measuring 1.9 x 1.9 x 1.6 cm (estimated volume 3 mL). Intraventricular extension with small volume blood within the adjacent fourth ventricle. No hydrocephalus. Right frontal approach shunt catheter in place with tip in the right lateral ventricle. Stable ventricular size and morphology. CT head 10/11 IMPRESSION: Small acute hemorrhage in the deep right cerebellar nuclei has increased by 1-2 mL since yesterday, now 6 mL. Mild surrounding edema.  MRI not able to perform due to VP shunt in 2015 CT repeat 10/12  pending Will do CTA head and neck next 2D Echo EF 60 to 65% in 05/2023 LDL 29 HgbA1c 5.8 UDS negative VTE prophylaxis -heparin subcu if repeat CT stable aspirin 81 mg daily prior to admission, now on No antithrombotic due to ICH Therapy recommendations:  CIR  Disposition: Pending  Hx of Stroke/TIA Right caudate stroke in 2011 03/2012 found to have DVT put on anticoagulation 06/2014 left basal ganglia hemorrhage with hydrocephalus status post VP shunt, had trach and PEG, anticoagulant stopped, IVC filter for bilateral DVTs  Hypertension Home meds:  hydralazine 100, metoprolol 100 Unstable Still on Cleviprex, taper off as able On metoprolol 50->100 bid, add home hydralazine Blood Pressure Goal: SBP less than 160   Hyperlipidemia Home meds: Lipitor 80 LDL 29, goal < 70 Hold off statin now due to low LDL  Dysphagia Patient has post-stroke dysphagia, SLP consulted  currently n.p.o. On tube feeding @ 60 DC IV fluid  Other Stroke Risk Factors Obesity, Body mass index is 32.5 kg/m., BMI >/=  30 associated with increased stroke risk, recommend weight loss, diet and exercise as appropriate  Family hx stroke (father) CAD Obstructive sleep apnea, Not on CPAP at home  Other Active Problems Reported RA Vascular dementia on [employer] capsule  Hospital day # 2  This patient is critically ill due to cerebellar ICH, history of stroke, hypertensive emergency, dysphagia and at significant risk of neurological worsening, death form hematoma expansion, brain herniation, hydrocephalus, aspiration pneumonia. This patient's care requires constant monitoring of vital signs, hemodynamics, respiratory and cardiac monitoring, review of multiple databases, neurological assessment, discussion with family, other specialists and medical decision making of high complexity. I spent 35 minutes of neurocritical care time in the care of this patient.     To contact Stroke Continuity provider, please refer to  [email or web address]. After hours, contact General Neurology

## 2023-09-21 NOTE — Plan of Care (Signed)
NIH score increasing. Left gaze becoming more fixed. Marvel Plan, MD updated on status change and asked for this RN to follow up on ordered MRI. Head CT and X-rays performed yesterday, but according to MRI staff, the requested images cannot be done until shunt make and model are acquired due to safety concerns. This RN discussed the issue with patient's wife. She states she does not know make or model, [place], and will try to acquire more info. MD updated.

## 2023-09-21 NOTE — Progress Notes (Signed)
Initial Nutrition Assessment  DOCUMENTATION CODES:   Not applicable  INTERVENTION:   Tube Feeding via Cortrak: Osmolite 1.5 at 60 ml/hr Pro-Source TF20 60 mL daily TF provides 2240 kcals, 110 g of protein, 1094 mL of free water  Additional free water of 200 mL q 4 hours 2294 mL of free water  Additional lipid calories via Cleviprex   NUTRITION DIAGNOSIS:   Inadequate oral intake related to acute illness as evidenced by NPO status.  GOAL:   Patient will meet greater than or equal to 90% of their needs  MONITOR:   Diet advancement, TF tolerance, Weight trends  REASON FOR ASSESSMENT:   Consult Enteral/tube feeding initiation and management  ASSESSMENT:   62 yo male admitted with acute intraparenchymal hemorrhage. PMH includes IVH with shunt, HTN, HLD, GERD, OSA, vascular dementia, CAD  Cleviprex at 11.1 ml/hr currently(provides 533 kcals at current rate over 24 hours)  NPO. Cortrak placed yesterday, tip either in distal stomach or proximal duodenum per abd xray report.   Osmolite 1.5 at 30 ml/hr with Pro-Source via Cortrak initiated yesterday  Unable to obtain diet and weight history at this time  Labs: sodium 141 (wdl), potassium 3.4 (L), Creatinine 168, BUN 21 Meds: senna-docusate  NUTRITION - FOCUSED PHYSICAL EXAM:   Unable to assess  Diet Order:   Diet Order             Diet NPO time specified  Diet effective now                   EDUCATION NEEDS:   Not appropriate for education at this time  Skin:  Skin Assessment: Reviewed RN Assessment  Last BM:  10/12 +smear, +flatus, abd soft  Height:   Ht Readings from Last 1 Encounters:  07/29/23 6' (1.829 m)    Weight:   Wt Readings from Last 1 Encounters:  09/20/23 108.7 kg    BMI:  Body mass index is 32.5 kg/m.  Estimated Nutritional Needs:   Kcal:  2000-2300 kcals  Protein:  100-115 g  Fluid:  >/= 2L   Romelle Starcher MS, RDN, LDN, CNSC Registered Dietitian 3 Clinical  Nutrition RD Pager and On-Call Pager Number Located in [place]

## 2023-09-22 DIAGNOSIS — I614 Nontraumatic intracerebral hemorrhage in cerebellum: Secondary | ICD-10-CM | POA: Diagnosis not present

## 2023-09-22 DIAGNOSIS — Z7189 Other specified counseling: Secondary | ICD-10-CM | POA: Diagnosis not present

## 2023-09-22 DIAGNOSIS — Z515 Encounter for palliative care: Secondary | ICD-10-CM | POA: Diagnosis not present

## 2023-09-22 LAB — GLUCOSE, CAPILLARY
Glucose-Capillary: 138 mg/dL — ABNORMAL HIGH (ref 70–99)
Glucose-Capillary: 119 mg/dL — ABNORMAL HIGH (ref 70–99)
Glucose-Capillary: 94 mg/dL (ref 70–99)
Glucose-Capillary: 91 mg/dL (ref 70–99)
Glucose-Capillary: 115 mg/dL — ABNORMAL HIGH (ref 70–99)

## 2023-09-22 LAB — BASIC METABOLIC PANEL
Anion gap: 9 (ref 5–15)
BUN: 24 mg/dL — ABNORMAL HIGH (ref 6–20)
CO2: 25 mmol/L (ref 22–32)
Calcium: 9.4 mg/dL (ref 8.9–10.3)
Chloride: 104 mmol/L (ref 98–111)
Creatinine, Ser: 1.53 mg/dL — ABNORMAL HIGH (ref 0.61–1.24)
GFR, Estimated: 52 mL/min — ABNORMAL LOW (ref >60)
Glucose, Bld: 116 mg/dL — ABNORMAL HIGH (ref 70–99)
Potassium: 3.9 mmol/L (ref 3.5–5.1)
Sodium: 138 mmol/L (ref 135–145)

## 2023-09-22 LAB — PHOSPHORUS: Phosphorus: 2.7 mg/dL (ref 2.5–4.6)

## 2023-09-22 LAB — MAGNESIUM: Magnesium: 2 mg/dL (ref 1.7–2.4)

## 2023-09-22 MED ORDER — TRAZODONE HCL 50 MG PO TABS
100.0000 mg | ORAL_TABLET | Freq: Every day | ORAL | Status: DC
  Administered 2023-09-22 – 2023-09-25 (×4): 100 mg via ORAL
  Filled 2023-09-22 (×4): qty 2

## 2023-09-22 MED ORDER — ATORVASTATIN CALCIUM 40 MG PO TABS
40.0000 mg | ORAL_TABLET | Freq: Every day | ORAL | Status: DC
  Administered 2023-09-23: 40 mg
  Filled 2023-09-22 (×2): qty 1

## 2023-09-22 MED ORDER — QUETIAPINE FUMARATE 25 MG PO TABS
12.5000 mg | ORAL_TABLET | Freq: Every evening | ORAL | Status: AC | PRN
  Administered 2023-09-23: 12.5 mg via ORAL
  Filled 2023-09-22: qty 1

## 2023-09-22 MED ORDER — HEPARIN SODIUM (PORCINE) 5000 UNIT/ML IJ SOLN
5000.0000 [IU] | Freq: Three times a day (TID) | INTRAMUSCULAR | Status: DC
  Administered 2023-09-22 – 2023-09-26 (×10): 5000 [IU] via SUBCUTANEOUS
  Filled 2023-09-22 (×11): qty 1

## 2023-09-22 MED ORDER — HYDRALAZINE HCL 50 MG PO TABS
100.0000 mg | ORAL_TABLET | Freq: Three times a day (TID) | ORAL | Status: DC
  Administered 2023-09-22 – 2023-09-26 (×10): 100 mg via ORAL
  Filled 2023-09-22 (×11): qty 2

## 2023-09-22 MED ORDER — QUETIAPINE FUMARATE 25 MG PO TABS: 12.5000 mg | ORAL_TABLET | Freq: Every evening | ORAL | Status: DC | PRN

## 2023-09-22 NOTE — Evaluation (Signed)
Occupational Therapy Evaluation Patient Details Name: Tony Davis. MRN: [id number] DOB: 04-Mar-1961 Today's Date: 09/22/2023   History of Present Illness Pt is 61 year old presented [facility] on  09/19/23 for weakness and lt gaze preference.Pt with acute intraparenchymal hemorrhage centered at the rt cerebellum. PMH - lt basal ganglia hemorrhage, hydrocephalus s/p shunt (2015), OSA, rheumatoid arthritis, vascular dementia, CAD   Clinical Impression   PTA, per chart, pt lived with wife and had caregivers from [facility]. Upon eval, pt with generalized weakness, decr vision, balance, strength, activity tolerance and cognition. Pt pleasantly confused throughout and following all one step commands. Pt requiring min A for UB ADL and up to max A for LB ADL. Pt with 24/7 support at home per chart. Due to significant change in functional status and support, recommending intensive multidisciplinary rehabilitation >3 hours/day to optimize safety and independence in ADL.        If plan is discharge home, recommend the following: A lot of help with walking and/or transfers;A lot of help with bathing/dressing/bathroom;Assistance with cooking/housework;Assist for transportation;Help with stairs or ramp for entrance;Direct supervision/assist for financial management;Direct supervision/assist for medications management    Functional Status Assessment  Patient has had a recent decline in their functional status and demonstrates the ability to make significant improvements in function in a reasonable and predictable amount of time.  Equipment Recommendations  Other (comment) (defer)    Recommendations for Other Services Rehab consult     Precautions / Restrictions        Mobility Bed Mobility Overal bed mobility: Needs Assistance Bed Mobility: Supine to Sit, Sit to Supine     Supine to sit: Min assist Sit to supine: Min assist   General bed mobility comments: Slow to initiate but able to come to  EOB with HOB elevated with min A for truncal elevation. Min A to bring BLE back into bed    Transfers Overall transfer level: Needs assistance Equipment used: 1 person hand held assist Transfers: Sit to/from Stand Sit to Stand: Min assist           General transfer comment: Min A for power up.      Balance Overall balance assessment: Needs assistance Sitting-balance support: Bilateral upper extremity supported, Feet supported Sitting balance-Leahy Scale: Poor Sitting balance - Comments: CGA for static sitting   Standing balance support: Single extremity supported Standing balance-Leahy Scale: Poor Standing balance comment: reliant on support                           ADL either performed or assessed with clinical judgement   ADL Overall ADL's : Needs assistance/impaired Eating/Feeding: NPO   Grooming: Set up;Sitting;Contact guard assist   Upper Body Bathing: Contact guard assist;Sitting   Lower Body Bathing: Maximal assistance;Sit to/from stand   Upper Body Dressing : Sitting;Minimal assistance   Lower Body Dressing: Maximal assistance;Sit to/from stand   Toilet Transfer: Minimal Cabin crew Details (indicate cue type and reason): 1 person HHA Toileting- Clothing Manipulation and Hygiene: Maximal assistance;Sit to/from stand       Functional mobility during ADLs: Minimal assistance (~4 ft)       Vision   Vision Assessment?: Vision impaired- to be further tested in functional context Additional Comments: Pt unable to report vision history. Pt with L gaze preference and head turn Able to scan R on command but not naturally turning head to switching gaze R to follow therapist  Perception         Praxis         Pertinent Vitals/Pain Pain Assessment Pain Assessment: No/denies pain     Extremity/Trunk Assessment Upper Extremity Assessment Upper Extremity Assessment: Generalized weakness;Right hand dominant  (compensatory abduction for shoilder flexion bilaterally, but able to hold arm in flexion after being moved passively? Generally weak L>R)   Lower Extremity Assessment Lower Extremity Assessment: Defer to PT evaluation       Communication Communication Communication: No apparent difficulties   Cognition Arousal: Alert Behavior During Therapy: Flat affect Overall Cognitive Status: No family/caregiver present to determine baseline cognitive functioning Area of Impairment: Attention, Memory, Following commands, Safety/judgement, Awareness, Problem solving, Orientation                 Orientation Level: Disoriented to, Time, Situation ("[place]" for place; cues for [facility] hospital) Current Attention Level: Sustained Memory: Decreased short-term memory Following Commands: Follows one step commands with increased time Safety/Judgement: Decreased awareness of safety, Decreased awareness of deficits Awareness: Intellectual Problem Solving: Slow processing, Requires verbal cues, Requires tactile cues, Difficulty sequencing General Comments: pleasantly confused. Inconsistent report of history; unable to report year, month, day. Following all one step commands with increased time.     General Comments  VSS, however, BP observed to be slightly lower at EOb than previous BPs taken during the day.    Exercises     Shoulder Instructions      Home Living Family/patient expects to be discharged to:: Private residence Living Arrangements: Spouse/significant other Available Help at Discharge: Family;Available PRN/intermittently Type of Home: House Home Access: Stairs to enter [employer] of Steps: 4 Entrance Stairs-Rails: Can reach both Home Layout: One level     Bathroom Shower/Tub: Chief Strategy Officer: Standard Bathroom Accessibility: Yes How Accessible: Accessible via walker;Accessible via wheelchair Home Equipment: Rolling Walker (2 wheels);Cane -  single point;Shower seat;Grab bars - tub/shower;Hand held shower head;BSC/3in1   Additional Comments: Information from prior encounter; pt unable to report today.      Prior Functioning/Environment Prior Level of Function : Needs assist;Patient poor historian/Family not available             Mobility Comments: Per notes pt amb without assistive device household distances and uses walker or w/c in the community ADLs Comments: Per chart review sometimes needs a little help bathing/dressing        OT Problem List: Decreased strength;Decreased activity tolerance;Impaired balance (sitting and/or standing);Decreased cognition;Decreased safety awareness;Decreased knowledge of use of DME or AE      OT Treatment/Interventions: Self-care/ADL training;Therapeutic exercise;DME and/or AE instruction;Balance training;Patient/family education;Cognitive remediation/compensation    OT Goals(Current goals can be found in the care plan section) Acute Rehab OT Goals Patient Stated Goal: none stated OT Goal Formulation: With patient Time For Goal Achievement: 10/06/23 Potential to Achieve Goals: Good  OT Frequency: Min 1X/week    Co-evaluation              AM-PAC OT "6 Clicks" Daily Activity     Outcome Measure Help from another person eating meals?: Total Help from another person taking care of personal grooming?: A Little Help from another person toileting, which includes using toliet, bedpan, or urinal?: A Lot Help from another person bathing (including washing, rinsing, drying)?: A Lot Help from another person to put on and taking off regular upper body clothing?: A Little Help from another person to put on and taking off regular lower body clothing?: A Lot 6 Click  Score: 13   End of Session Nurse Communication: Mobility status (RN deferred chair for safety secondary to pt confusion)  Activity Tolerance: Patient tolerated treatment well Patient left: in bed;with call bell/phone  within reach;with bed alarm set  OT Visit Diagnosis: Unsteadiness on feet (R26.81);Muscle weakness (generalized) (M62.81);Other symptoms and signs involving cognitive function;Low vision, both eyes (H54.2)                Time: 1124-1150 OT Time Calculation (min): 26 min Charges:  OT General Charges $OT Visit: 1 Visit OT Evaluation $OT Eval Moderate Complexity: 1 Mod OT Treatments $Self Care/Home Management : 8-22 mins  Tyler Deis, OTR/L [facility] Acute Rehabilitation Office: [phone]   Myrla Halsted 09/22/2023, 12:05 PM

## 2023-09-22 NOTE — Progress Notes (Signed)
STROKE TEAM PROGRESS NOTE   BRIEF HPI [name]. is a 62 y.o. 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 New left-sided facial swelling. EMS reported increasing BP in transport.  CT scan on admission showed right cerebellar ICH 1.9 x 1.9 x 1.6 cm .  Right lateral ventricle shunt.   SIGNIFICANT HOSPITAL EVENTS 10/10 - admitted via ED to Cardiac ICU, CT Head as above 10./12 - CT repeat stable  INTERIM HISTORY/SUBJECTIVE RN at the bedside.  Patient reclining in bed, sleepy but more awake than yesterday.  Follows simple commands, left gaze and right facial droop has improved from yesterday.  Still on tube feeding.  Per RN, patient agitated last night, received Seroquel 12.5 mg.  Will resume trazodone at bedtime home medication.   OBJECTIVE  CBC    Component Value Date/Time   WBC 7.5 09/21/2023 0838   RBC 4.68 09/21/2023 0838   HGB 14.3 09/21/2023 0838   HCT 42.2 09/21/2023 0838   PLT 245 09/21/2023 0838   MCV 90.2 09/21/2023 0838   MCH 30.6 09/21/2023 0838   MCHC 33.9 09/21/2023 0838   RDW 13.2 09/21/2023 0838   LYMPHSABS 1.0 09/19/2023 2233   MONOABS 0.4 09/19/2023 2233   EOSABS 0.1 09/19/2023 2233   BASOSABS 0.1 09/19/2023 2233    BMET    Component Value Date/Time   NA 138 09/22/2023 0306   NA 142 04/18/2018 0929   K 3.9 09/22/2023 0306   CL 104 09/22/2023 0306   CO2 25 09/22/2023 0306   GLUCOSE 116 (H) 09/22/2023 0306   BUN 24 (H) 09/22/2023 0306   BUN 17 04/18/2018 0929   CREATININE 1.53 (H) 09/22/2023 0306   CREATININE 1.08 06/17/2015 1626   CALCIUM 9.4 09/22/2023 0306   GFRNONAA 52 (L)  09/22/2023 0306    IMAGING past 24 hours No results found.  Vitals:   09/22/23 1216 09/22/23 1300 09/22/23 1400 09/22/23 1500  BP:  124/74 137/80 132/86  Pulse:  64 64 (!) 58  Resp:  14 18 (!) 22  Temp: 97.6 F (36.4 C)     TempSrc: Oral     SpO2:  94% 95% 95%  Weight:      Height:         PHYSICAL EXAM  Temp:  [97.6 F (36.4 C)-98.3 F (36.8 C)] 97.6 F (36.4 C) (10/13 1216) Pulse Rate:  [58-85] 58 (10/13 1500) Resp:  [7-22] 22 (10/13 1500) BP: (113-149)/(72-103) 132/86 (10/13 1500) SpO2:  [88 %-99 %] 95 % (10/13 1500) Weight:  [106.9 kg] 106.9 kg (10/13 0500)  General - Well nourished, well developed, in no apparent distress, mildly drowsy sleepy.  Ophthalmologic - fundi not visualized due to noncooperation.  Cardiovascular - Regular rhythm and rate.  Neuro - drowsy sleepy but arousable, orientated to age, place, time. No aphasia, fluent language but paucity of speech, following all simple commands, mild to moderate dysarthria. Able to name and repeat.  Left gaze preference, not able to have complete right gaze but cross midline, visual field full, PERRL. No significant facial droop today. Tongue midline. Bilateral UEs 5/5, no drift. Bilaterally LEs 4/5, no drift. Sensation symmetrical bilaterally, b/l FTN intact grossly, gait not tested.    ASSESSMENT/PLAN  ICH - right cerebellum small ICH, etiology likely due to hypertensive Code Stroke CT HEAD Acute intraparenchymal hemorrhage centered at the right cerebellum measuring 1.9 x 1.9 x 1.6 cm (estimated volume 3 mL). Intraventricular extension with small volume blood within the adjacent fourth ventricle. No hydrocephalus. Right frontal approach shunt catheter in place with tip in the right lateral ventricle. Stable ventricular size and morphology. CT head 10/11 IMPRESSION: Small acute hemorrhage in the deep right cerebellar nuclei has increased by 1-2 mL since yesterday, now 6 mL. Mild surrounding edema.  MRI not able to  perform due to VP shunt in 2015 CT repeat 10/12 stable hematoma, no hydrocephalus Will do CTA head and neck next 2D Echo EF 60 to 65% in 05/2023 LDL 29 HgbA1c 5.8 UDS negative VTE prophylaxis heparin subcu aspirin 81 mg daily prior to admission, now on No antithrombotic due to ICH Therapy recommendations:  CIR  Disposition: Pending  Hx of Stroke/TIA Right caudate stroke in 2011 03/2012 found to have DVT put on anticoagulation 06/2014 left basal ganglia hemorrhage with hydrocephalus status post VP shunt, had trach and PEG, anticoagulant stopped, IVC filter for bilateral DVTs  Hypertension Home meds:  hydralazine 100, metoprolol 100 Unstable Just off cleviprex On metoprolol 50->100 bid, hydralazine 100 bid-> tid Blood Pressure Goal: SBP less than 160   Hyperlipidemia Home meds: Lipitor 80 LDL 29, goal < 70 Decrease lipitor to 40 Continue statin on discharge  Dysphagia Patient has post-stroke dysphagia, SLP consulted  currently n.p.o. On tube feeding @ 60 DC IV fluid  Other Stroke Risk Factors Obesity, Body mass index is 31.96 kg/m., BMI >/= 30 associated with increased stroke risk, recommend weight loss, diet and exercise as appropriate  Family hx stroke (father) CAD Obstructive sleep apnea, Not on CPAP at home  Other Active Problems Reported RA Vascular dementia on Exelon capsule CKD IIIa, creatinine 1.80->1.68->1.53  Hospital day # 3  Marvel Plan, MD PhD Stroke Neurology 09/22/2023 3:43 PM   This patient is critically ill due to cerebellar ICH, history of stroke, hypertensive emergency, dysphagia and at significant risk of neurological worsening, death form hematoma expansion, brain herniation, hydrocephalus, aspiration pneumonia. This patient's care requires constant monitoring of vital signs, hemodynamics, respiratory and cardiac monitoring, review of multiple databases, neurological assessment, discussion with family, other specialists and medical decision  making of high complexity. I spent 35 minutes of neurocritical care time in the care of this patient.     To contact Stroke Continuity provider, please refer to [email or web address]. After hours, contact General Neurology

## 2023-09-22 NOTE — Progress Notes (Addendum)
Daily Progress Note   Patient Name: Tony Davis.       Date: 09/22/2023 DOB: [birth year]  Age: 62 y.o. MRN#: [id number] Attending Physician: Stroke, Md, MD Primary Care Physician: [facility] Admit Date: 09/19/2023  Reason for Consultation/Follow-up: Establishing goals of care  Subjective: Patient is awake in bed in NAD. No family at bedside.  Length of Stay: 3  Current Medications: Scheduled Meds:   Chlorhexidine Gluconate Cloth  6 each Topical Q0600   escitalopram  20 mg Per Tube Daily   feeding supplement (PROSource TF20)  60 mL Per Tube Daily   free water  200 mL Per Tube Q4H   hydrALAZINE  100 mg Oral BID   memantine  10 mg Per Tube BID   metoprolol tartrate  100 mg Per Tube BID   pantoprazole (PROTONIX) IV  40 mg Intravenous QHS   senna-docusate  1 tablet Per Tube BID    Continuous Infusions:  clevidipine 8 mg/hr (09/22/23 0800)   feeding supplement (OSMOLITE 1.5 CAL) 60 mL/hr at 09/22/23 0800    PRN Meds: acetaminophen **OR** acetaminophen (TYLENOL) oral liquid 160 mg/5 mL **OR** acetaminophen, hydrALAZINE, labetalol, mouth rinse  Physical Exam Vitals reviewed.  Constitutional:      General: He is awake.     Comments: cortrak  Cardiovascular:     Rate and Rhythm: Normal rate.  Pulmonary:     Effort: Pulmonary effort is normal.             Vital Signs: BP 134/89   Pulse 71   Temp 98 F (36.7 C) (Oral)   Resp 15   Ht 6' (1.829 m)   Wt 106.9 kg   SpO2 97%   BMI 31.96 kg/m  SpO2: SpO2: 97 % O2 Device: O2 Device: Room Air O2 Flow Rate:     Patient Active Problem List   Diagnosis Date Noted   Cerebellar hemorrhage (HCC) 09/19/2023   Left renal mass 01/18/2023   Obesity (BMI 30-39.9) 04/21/2019   Altered  mental status 09/20/2018   Hypokalemia 12/26/2015   Depression 02/03/2015   Diastolic dysfunction with chronic heart failure (HCC) 11/30/2014   Essential hypertension 11/03/2014   LV dysfunction    Hydrocephalus (HCC)    Basal ganglia hemorrhage (HCC) 07/30/2014   Routine general medical  examination at a health care facility 02/11/2014   Insomnia, persistent 03/25/2013   Chronic rhinitis 03/25/2013   Hemiparesis due to old cerebral infarction (HCC) 03/20/2013   Folate deficiency anemia 10/13/2012   Long term (current) use of anticoagulants 03/28/2012   Obstructive sleep apnea 08/16/2011   Prediabetes 08/06/2011   PATENT FORAMEN OVALE 03/13/2010   CAD, NATIVE VESSEL 01/26/2010   Hyperlipidemia with target LDL less than 100 01/23/2010   GERD 01/23/2010    Palliative Care Assessment & Plan   Patient Profile: 62 y.o. male  with past medical history of hypertension, hyperlipidemia, left basal ganglia hemorrhage with intraventricular extension complicated by hydrocephalus s/p shunt (2015), obstructive sleep apnea on CPAP, BMI 33, rheumatoid arthritis on chart, vascular dementia, remote smoking, coronary artery disease, prediabetes admitted on 09/19/2023 with after vomiting and worsening left gaze preference. Also noted to have new left-sided facial swelling. EMS reported increasing BP in transport.    Head CT was remarkable for an acute intraparenchymal hemorrhage centered at the right cerebellum. Pt with a hx of dysphagia and cognitive-linguistic deficits.   Assessment: 0945: Spoke with patient's wife [place]. We discussed the patient's long night last night and the change in neuro check frequency overnight from every 1 hour to every 4 hours. The patient's wife shares that she is still trying to figure out the information on the shunt so the patient can have a MRI. She is also trying to balance working and being at the hospital with her husband anticipating she will need time off after  discharge. She understands that she faces treatment option decisions, advanced directive decisions, and anticipatory care needs for the patient.  She tells me she called the patient's family last night to tell them about the change to his code status. Not everybody agrees with her decision. She still feels comfortable with her decision to make his code status do not attempt resuscitation. I shared that I agree to DNR code status understanding evidenced-based poor outcomes in similar hospitalized patients, as the cause of the arrest is likely associated with chronic/terminal disease rather than a reversible acute cardio-pulmonary event.   She shares that she is worried. She knows how much the patient went through after his stroke nine years ago and she wants to make sure she can manage his care at home. She is hopeful the patient will eventually be accepted into [facility]. She is glad she has [facility] as a Theatre stage manager.   Discussed the importance of continued conversation with family and the medical providers regarding overall plan of care and treatment options, ensuring decisions are within the context of the patient's values and GOCs.  Questions and concerns were addressed. The family was encouraged to call with questions or concerns.  Recommendations/Plan: DNR Full scope of treatment Continue with [facility] at discharge PMT to shadow chart and is available if needs arise (per [place]'s request)    Code Status:    Code Status Orders  (From admission, onward)           Start     Ordered   09/21/23 1149  Do not attempt resuscitation (DNR) Pre-Arrest Interventions Desired  (Code Status)  Continuous       Question Answer Comment  If pulseless and not breathing No CPR or chest compressions.   In Pre-Arrest Conditions (Patient Has Pulse and Is Breathing) May intubate, use advanced airway interventions and cardioversion/ACLS medications if appropriate or indicated. May transfer to ICU.   Consent: Discussion  documented in EHR or advanced directives  reviewed      09/21/23 1148        Extensive chart review has been completed prior to seeing the patient and speaking with his wife [place] including labs, vital signs, imagine, progress/consult notes, orders, medications, and available advance directive documents.  Care plan was discussed with bedside RN  Time spent: 35 minutes  Thank you for allowing the Palliative Medicine Team to assist in the care of this patient.    Sherryll Burger, NP  Please contact Palliative Medicine Team phone at [phone] for questions and concerns.

## 2023-09-22 NOTE — Progress Notes (Signed)
Inpatient Rehab Admissions Coordinator:    I spoke with pt.'s wife via phone to discuss potential [facility] admit. She is interested, as pt has been to [facility] before and done well She states that between herself, [facility] program, and daytime caregivers provided by [facility], Pt. Will have 24/7 support after discharge. I will follow for potential admit pending insurance auth.   Megan Salon, MS, CCC-SLP Rehab Admissions Coordinator  [phone] (celll) [phone] (office)

## 2023-09-23 ENCOUNTER — Inpatient Hospital Stay (HOSPITAL_COMMUNITY): Payer: Medicare (Managed Care)

## 2023-09-23 DIAGNOSIS — I614 Nontraumatic intracerebral hemorrhage in cerebellum: Secondary | ICD-10-CM | POA: Diagnosis not present

## 2023-09-23 LAB — CBC
HCT: 42.2 % (ref 39.0–52.0)
Hemoglobin: 13.4 g/dL (ref 13.0–17.0)
MCH: 30 pg (ref 26.0–34.0)
MCHC: 31.8 g/dL (ref 30.0–36.0)
MCV: 94.6 fL (ref 80.0–100.0)
Platelets: 214 10*3/uL (ref 150–400)
RBC: 4.46 MIL/uL (ref 4.22–5.81)
RDW: 13.4 % (ref 11.5–15.5)
WBC: 6.6 10*3/uL (ref 4.0–10.5)
nRBC: 0 % (ref 0.0–0.2)

## 2023-09-23 LAB — BASIC METABOLIC PANEL
Anion gap: 14 (ref 5–15)
BUN: 26 mg/dL — ABNORMAL HIGH (ref 6–20)
CO2: 24 mmol/L (ref 22–32)
Calcium: 9.2 mg/dL (ref 8.9–10.3)
Chloride: 102 mmol/L (ref 98–111)
Creatinine, Ser: 1.73 mg/dL — ABNORMAL HIGH (ref 0.61–1.24)
GFR, Estimated: 45 mL/min — ABNORMAL LOW (ref >60)
Glucose, Bld: 90 mg/dL (ref 70–99)
Potassium: 3.8 mmol/L (ref 3.5–5.1)
Sodium: 140 mmol/L (ref 135–145)

## 2023-09-23 LAB — GLUCOSE, CAPILLARY
Glucose-Capillary: 96 mg/dL (ref 70–99)
Glucose-Capillary: 86 mg/dL (ref 70–99)
Glucose-Capillary: 102 mg/dL — ABNORMAL HIGH (ref 70–99)
Glucose-Capillary: 105 mg/dL — ABNORMAL HIGH (ref 70–99)

## 2023-09-23 MED ORDER — ESCITALOPRAM OXALATE 20 MG PO TABS
20.0000 mg | ORAL_TABLET | Freq: Every day | ORAL | Status: DC
  Administered 2023-09-24 – 2023-09-26 (×3): 20 mg via ORAL
  Filled 2023-09-23 (×3): qty 1

## 2023-09-23 MED ORDER — ATORVASTATIN CALCIUM 40 MG PO TABS
40.0000 mg | ORAL_TABLET | Freq: Every day | ORAL | Status: DC
  Administered 2023-09-24 – 2023-09-26 (×3): 40 mg via ORAL
  Filled 2023-09-23 (×3): qty 1

## 2023-09-23 MED ORDER — PANTOPRAZOLE SODIUM 40 MG PO TBEC
40.0000 mg | DELAYED_RELEASE_TABLET | Freq: Every day | ORAL | Status: DC
  Administered 2023-09-24 – 2023-09-26 (×3): 40 mg via ORAL
  Filled 2023-09-23 (×4): qty 1

## 2023-09-23 MED ORDER — SENNOSIDES-DOCUSATE SODIUM 8.6-50 MG PO TABS
1.0000 | ORAL_TABLET | Freq: Two times a day (BID) | ORAL | Status: DC
  Administered 2023-09-24 – 2023-09-26 (×5): 1 via ORAL
  Filled 2023-09-23 (×6): qty 1

## 2023-09-23 MED ORDER — MIDAZOLAM HCL 2 MG/2ML IJ SOLN
INTRAMUSCULAR | Status: AC
  Filled 2023-09-23: qty 2

## 2023-09-23 MED ORDER — MIDAZOLAM HCL 2 MG/2ML IJ SOLN: 1.0000 mg | Freq: Once | INTRAMUSCULAR | Status: DC | PRN

## 2023-09-23 MED ORDER — METOPROLOL TARTRATE 100 MG PO TABS
100.0000 mg | ORAL_TABLET | Freq: Two times a day (BID) | ORAL | Status: DC
  Administered 2023-09-23 – 2023-09-26 (×6): 100 mg via ORAL
  Filled 2023-09-23 (×6): qty 1

## 2023-09-23 MED ORDER — MEMANTINE HCL 10 MG PO TABS
10.0000 mg | ORAL_TABLET | Freq: Two times a day (BID) | ORAL | Status: DC
  Administered 2023-09-23 – 2023-09-26 (×6): 10 mg via ORAL
  Filled 2023-09-23 (×7): qty 1

## 2023-09-23 NOTE — Progress Notes (Signed)
Physical Therapy Treatment Patient Details Name: Tony Davis. MRN: [id number] DOB: 1961/11/01 Today's Date: 09/23/2023   History of Present Illness Pt is 62 year old presented to [facility] on  09/19/23 for weakness and lt gaze preference.Pt with acute intraparenchymal hemorrhage centered at the rt cerebellum. PMH - lt basal ganglia hemorrhage, hydrocephalus s/p shunt (2015), OSA, rheumatoid arthritis, vascular dementia, CAD    PT Comments  Pt progressing well towards all goals. Pt's wife present and reports his cognition and confusion are his baseline, however the closing of L eye, vision impairment, L gaze preference and keeping head turned to the L in addition to weakness on the R is all new from recent stroke. Pt cooperative and pleasant. Pt able to begin gait training this date however required modA and use of RW in which spouse reports he didn't need a walker PTA. Continue to recommend inpatient rehab program > 3 hrs a day to address above deficits and advance mobility to supervision as it was PTA.    If plan is discharge home, recommend the following: A lot of help with walking and/or transfers;A lot of help with bathing/dressing/bathroom;Direct supervision/assist for medications management;Direct supervision/assist for financial management;Assist for transportation   Can travel by private vehicle        Equipment Recommendations  None recommended by PT    Recommendations for Other Services       Precautions / Restrictions Restrictions Weight Bearing Restrictions: No     Mobility  Bed Mobility Overal bed mobility: Needs Assistance Bed Mobility: Supine to Sit, Sit to Supine Rolling: +2 for safety/equipment, Mod assist Sidelying to sit: Mod assist, HOB elevated, +2 for safety/equipment       General bed mobility comments: modA to initiate rolling to the right, tactile cues to bring LEs off EOB, modA for trunk elevation to complete sitting    Transfers Overall transfer  level: Needs assistance Equipment used: Rolling walker (2 wheels) Transfers: Sit to/from Stand Sit to Stand: Mod assist           General transfer comment: modA to power up and provided step by step sequencing cues. Pt  with posterior bias, verbal cues to push up from bed not pull up on walker    Ambulation/Gait Ambulation/Gait assistance: Mod assist, +2 safety/equipment Gait Distance (Feet): 100 Feet (x2) Assistive device: Rolling walker (2 wheels) Gait Pattern/deviations: Step-to pattern, Decreased stride length, Drifts right/left, Narrow base of support Gait velocity: impulsively fast Gait velocity interpretation: 1.31 - 2.62 ft/sec, indicative of limited community ambulator   General Gait Details: pt impulsively quick, vears to the Right requiring modA for walker managment, max directional cues, pt very UE dependent, pt reports "My legs, they don't feel right."   Stairs             Wheelchair Mobility     Tilt Bed    Modified Rankin (Stroke Patients Only)       Balance Overall balance assessment: Needs assistance Sitting-balance support: Bilateral upper extremity supported, Feet supported Sitting balance-Leahy Scale: Fair Sitting balance - Comments: CGA for static sitting   Standing balance support: Single extremity supported Standing balance-Leahy Scale: Poor Standing balance comment: reliant on support                            Cognition Arousal: Alert Behavior During Therapy: Flat affect Overall Cognitive Status: No family/caregiver present to determine baseline cognitive functioning Area of Impairment: Attention, Memory, Following commands,  Safety/judgement, Awareness, Problem solving, Orientation                 Orientation Level: Disoriented to, Time, Situation, Place Current Attention Level: Sustained Memory: Decreased short-term memory (no recall of info once re-oriented) Following Commands: Follows one step commands with  increased time Safety/Judgement: Decreased awareness of safety, Decreased awareness of deficits Awareness: Intellectual Problem Solving: Slow processing, Requires verbal cues, Requires tactile cues, Difficulty sequencing General Comments: wife came in at end of session and reports his cognition is at baseline, he never covered from the CVA he had in 2015.        Exercises      General Comments General comments (skin integrity, edema, etc.): VSS, RN did give pt HTN meds prior to therapy      Pertinent Vitals/Pain Pain Assessment Pain Assessment: Faces Faces Pain Scale: No hurt    Home Living                          Prior Function            PT Goals (current goals can now be found in the care plan section) Acute Rehab PT Goals Patient Stated Goal: not stated PT Goal Formulation: Patient unable to participate in goal setting Time For Goal Achievement: 10/05/23 Potential to Achieve Goals: Fair Progress towards PT goals: Progressing toward goals    Frequency    Min 1X/week      PT Plan      Co-evaluation              AM-PAC PT "6 Clicks" Mobility   Outcome Measure  Help needed turning from your back to your side while in a flat bed without using bedrails?: A Lot Help needed moving from lying on your back to sitting on the side of a flat bed without using bedrails?: Total Help needed moving to and from a bed to a chair (including a wheelchair)?: Total Help needed standing up from a chair using your arms (e.g., wheelchair or bedside chair)?: Total Help needed to walk in hospital room?: Total Help needed climbing 3-5 steps with a railing? : Total 6 Click Score: 7    End of Session Equipment Utilized During Treatment: Gait belt Activity Tolerance: Patient tolerated treatment well Patient left: with call bell/phone within reach;in chair;with chair alarm set;with family/visitor present Nurse Communication: Mobility status (nurse present) PT Visit  Diagnosis: Unsteadiness on feet (R26.81);Other abnormalities of gait and mobility (R26.89);Other symptoms and signs involving the nervous system (R29.898)     Time: 3016-0109 PT Time Calculation (min) (ACUTE ONLY): 35 min  Charges:    $Gait Training: 8-22 mins $Therapeutic Activity: 8-22 mins PT General Charges $$ ACUTE PT VISIT: 1 Visit                     Lewis Shock, PT, DPT Acute Rehabilitation Services Secure chat preferred Office #: [phone]    Iona Hansen 09/23/2023, 3:59 PM

## 2023-09-23 NOTE — Procedures (Signed)
Providing Compassionate, Quality Care - Together   Procedure: Ventriculoperitoneal Shunt Reprogram  Patient: Tony Davis.   Location: 1O10R/6E45W-09  Shunt Manufacturer/Model: Medtronic/Strata  Starting Setting: 2.5  Ending Setting: 1.0  The patient tolerated the procedure well. The final setting was verified on the programmer.    MR BRAIN WO CONTRAST  Result Date: 09/23/2023 CLINICAL DATA:  Neuro deficit, acute, stroke suspected; Facial paralysis/weakness (CN 7). EXAM: MRI HEAD WITHOUT CONTRAST MRA HEAD WITHOUT CONTRAST TECHNIQUE: Multiplanar, multi-echo pulse sequences of the brain and surrounding structures were acquired without intravenous contrast. Angiographic images of the Circle of Willis were acquired using MRA technique without intravenous contrast. COMPARISON:  Head CT 09/21/2023. Head MRI 03/17/2012. Head MRA 08/17/2011. FINDINGS: MRI HEAD FINDINGS Brain: A right frontal approach ventriculostomy catheter terminates in the region of the frontal horn of the right lateral ventricle with unchanged size and configuration of the ventricles compared to the recent CT. There is prominent magnetic susceptibility artifact from the external component of the shunt which obscures portions of the right cerebral hemisphere, particularly on diffusion weighted and susceptibility weighted imaging limiting assessment for acute infarct and hemorrhage in the right hemisphere. A 2.5 cm parenchymal hematoma centered in the right aspect of the cerebellar vermis is unchanged from the recent CT, with unchanged mild surrounding edema and mild mass effect on the fourth ventricle. A subcentimeter area of mildly restricted diffusion extends anteriorly from the hemorrhage into the right superior cerebellar peduncle and may reflect a small acute/subacute infarct or artifact from the adjacent hemorrhage. Minimal blood products of uncertain chronicity are present in the occipital horn of the left lateral  ventricle. There is evidence of a remote hemorrhage in the left basal ganglia with encephalomalacia, ex vacuo dilatation of the left lateral ventricle, and likely chronic blood products along the third ventricle. Chronic microhemorrhages are also evident in the thalami. There is a small chronic cortical infarct in the right parietal lobe. Patchy T2 hyperintensities elsewhere in the cerebral white matter bilaterally are nonspecific but compatible with moderate chronic small vessel ischemic disease. No midline shift or extra-axial fluid collection is evident. Vascular: Major intracranial vascular flow voids are preserved. Skull and upper cervical spine: Unremarkable bone marrow signal. Sinuses/Orbits: Unremarkable orbits. No significant inflammatory changes in the paranasal sinuses. Clear mastoid air cells. Other: None. MRA HEAD FINDINGS Anterior circulation: The internal carotid arteries are widely patent from skull base to carotid termini. ACAs and MCAs are patent without evidence of a proximal branch occlusion or significant proximal stenosis. Detail branch vessel evaluation is limited by motion artifact, and susceptibility artifact limits assessment of right MCA and distal ACA branches. No aneurysm is identified. Posterior circulation: The included portions of the intracranial vertebral arteries are patent to the basilar and codominant. Patent AICA and SCA origins are visualized bilaterally. The basilar artery is widely patent. Posterior communicating arteries are diminutive or absent. Both PCAs are patent without evidence of a significant proximal stenosis. No aneurysm or vascular malformation is identified. Anatomic variants: None. IMPRESSION: 1. Unchanged 2.5 cm parenchymal hematoma in the cerebellum with mild edema. 2. Unchanged size and configuration of the shunted ventricles. No acute hydrocephalus. 3. Moderate chronic small vessel ischemic disease. Remote left basal ganglia hemorrhage. 4. No major  intracranial arterial occlusion, significant proximal stenosis, or evidence aneurysm/vascular malformation within limitations of artifact. Electronically Signed   By: Sebastian Ache M.D.   On: 09/23/2023 15:40   MR ANGIO HEAD WO CONTRAST  Result Date: 09/23/2023 CLINICAL DATA:  Neuro  deficit, acute, stroke suspected; Facial paralysis/weakness (CN 7). EXAM: MRI HEAD WITHOUT CONTRAST MRA HEAD WITHOUT CONTRAST TECHNIQUE: Multiplanar, multi-echo pulse sequences of the brain and surrounding structures were acquired without intravenous contrast. Angiographic images of the Circle of Willis were acquired using MRA technique without intravenous contrast. COMPARISON:  Head CT 09/21/2023. Head MRI 03/17/2012. Head MRA 08/17/2011. FINDINGS: MRI HEAD FINDINGS Brain: A right frontal approach ventriculostomy catheter terminates in the region of the frontal horn of the right lateral ventricle with unchanged size and configuration of the ventricles compared to the recent CT. There is prominent magnetic susceptibility artifact from the external component of the shunt which obscures portions of the right cerebral hemisphere, particularly on diffusion weighted and susceptibility weighted imaging limiting assessment for acute infarct and hemorrhage in the right hemisphere. A 2.5 cm parenchymal hematoma centered in the right aspect of the cerebellar vermis is unchanged from the recent CT, with unchanged mild surrounding edema and mild mass effect on the fourth ventricle. A subcentimeter area of mildly restricted diffusion extends anteriorly from the hemorrhage into the right superior cerebellar peduncle and may reflect a small acute/subacute infarct or artifact from the adjacent hemorrhage. Minimal blood products of uncertain chronicity are present in the occipital horn of the left lateral ventricle. There is evidence of a remote hemorrhage in the left basal ganglia with encephalomalacia, ex vacuo dilatation of the left lateral  ventricle, and likely chronic blood products along the third ventricle. Chronic microhemorrhages are also evident in the thalami. There is a small chronic cortical infarct in the right parietal lobe. Patchy T2 hyperintensities elsewhere in the cerebral white matter bilaterally are nonspecific but compatible with moderate chronic small vessel ischemic disease. No midline shift or extra-axial fluid collection is evident. Vascular: Major intracranial vascular flow voids are preserved. Skull and upper cervical spine: Unremarkable bone marrow signal. Sinuses/Orbits: Unremarkable orbits. No significant inflammatory changes in the paranasal sinuses. Clear mastoid air cells. Other: None. MRA HEAD FINDINGS Anterior circulation: The internal carotid arteries are widely patent from skull base to carotid termini. ACAs and MCAs are patent without evidence of a proximal branch occlusion or significant proximal stenosis. Detail branch vessel evaluation is limited by motion artifact, and susceptibility artifact limits assessment of right MCA and distal ACA branches. No aneurysm is identified. Posterior circulation: The included portions of the intracranial vertebral arteries are patent to the basilar and codominant. Patent AICA and SCA origins are visualized bilaterally. The basilar artery is widely patent. Posterior communicating arteries are diminutive or absent. Both PCAs are patent without evidence of a significant proximal stenosis. No aneurysm or vascular malformation is identified. Anatomic variants: None. IMPRESSION: 1. Unchanged 2.5 cm parenchymal hematoma in the cerebellum with mild edema. 2. Unchanged size and configuration of the shunted ventricles. No acute hydrocephalus. 3. Moderate chronic small vessel ischemic disease. Remote left basal ganglia hemorrhage. 4. No major intracranial arterial occlusion, significant proximal stenosis, or evidence aneurysm/vascular malformation within limitations of artifact.  Electronically Signed   By: Sebastian Ache M.D.   On: 09/23/2023 15:40       Ephriam Knuckles, AGNP-C Nurse Practitioner 09/23/2023 4:16 PM    [facility] [address], [address], [place], [place] [address] P: [phone]    F: [phone]

## 2023-09-23 NOTE — Progress Notes (Addendum)
Inpatient Rehab Admissions Coordinator:   Pt. More alert and cooperative today. He is now allowing nursing to care for him and is participating with therapies. I will send case to insurance for authorization.   Megan Salon, MS, CCC-SLP Rehab Admissions Coordinator  [phone] (celll) [phone] (office)

## 2023-09-23 NOTE — Progress Notes (Signed)
Speech Language Pathology Treatment: Dysphagia  Patient Details Name: Tony Davis. MRN: [id number] DOB: Oct 15, 1961 Today's Date: 09/23/2023 Time: 1191-4782 SLP Time Calculation (min) (ACUTE ONLY): 8 min  Assessment / Plan / Recommendation Clinical Impression  Pt was kept NPO after swallow assessment due to emesis. He has not had any further episodes of emesis per MD/RN/pt. He consumed consecutive straw sips thin without indications of airway intrusion x 2 with stable respirations and clear vocal quality. Mastication of solid was efficient with brisk transit and no oral residual. SLP ordered regular texture, thin liquids and can take pills with thin liquids. He does not need further follow up for swallow. He will still be followed for cognition.  HPI HPI: Pt is a 62 y.o with a past medical history significant for hypertension, hyperlipidemia, left basal ganglia hemorrhage with intraventricular extension complicated by hydrocephalus s/p shunt (2015), obstructive sleep apnea on CPAP, BMI 33, rheumatoid arthritis on chart, vascular dementia, remote smoking, coronary artery disease, prediabetes.  Pt presents with hypertension and worsening left gaze preference. Head CT was remarkable for an acute intraparenchymal hemorrhage centered at the right cerebellum.  Pt with a hx of dysphagia and cognitive-linguistic deficits.      SLP Plan  All goals met;Discharge SLP treatment due to (comment) (met swallow goals)      Recommendations for follow up therapy are one component of a multi-disciplinary discharge planning process, led by the attending physician.  Recommendations may be updated based on patient status, additional functional criteria and insurance authorization.    Recommendations  Diet recommendations: Regular;Thin liquid Liquids provided via: Cup;Straw Medication Administration: Whole meds with liquid Supervision: Patient able to self feed Compensations: Slow rate;Small  sips/bites Postural Changes and/or Swallow Maneuvers: Seated upright 90 degrees                  Oral care BID   Set up Supervision/Assistance Dysphagia, unspecified (R13.10)     All goals met;Discharge SLP treatment due to (comment) (met swallow goals)     Royce Macadamia  09/23/2023, 9:44 AM

## 2023-09-23 NOTE — Progress Notes (Addendum)
STROKE TEAM PROGRESS NOTE   BRIEF HPI [name]. is a 62 y.o. 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 New left-sided facial swelling. EMS reported increasing BP in transport.  CT scan on admission showed right cerebellar ICH 1.9 x 1.9 x 1.6 cm .  Right lateral ventricle shunt.   SIGNIFICANT HOSPITAL EVENTS 10/10 - admitted via ED to Cardiac ICU, CT Head as above 10/12 - CT repeat stable, palliative care meeting held, pt DNR 10/14 - MR Angio Head/Neck and MR Brain ordered, pt transferred from ICU care   INTERIM HISTORY/SUBJECTIVE RN at the bedside, patient returning from scan.  Patient reclining in bed, sleepy but more awake than yesterday.  Follows simple commands, left gaze and right facial droop has improved from yesterday.  Still on tube feeding.  Per RN, patient agitated last night, received Seroquel 12.5 mg. Will resume trazodone at bedtime home medication.  Pt reports no pain, no new weaknesses. Pt voicing desire to eat.   OBJECTIVE  CBC    Component Value Date/Time   WBC 6.6 09/23/2023 0545   RBC 4.46 09/23/2023 0545   HGB 13.4 09/23/2023 0545   HCT 42.2 09/23/2023 0545   PLT 214 09/23/2023 0545   MCV 94.6 09/23/2023 0545   MCH 30.0 09/23/2023 0545   MCHC 31.8 09/23/2023 0545   RDW 13.4 09/23/2023 0545   LYMPHSABS 1.0 09/19/2023 2233   MONOABS 0.4 09/19/2023 2233   EOSABS 0.1 09/19/2023 2233   BASOSABS 0.1 09/19/2023 2233    BMET    Component Value Date/Time   NA 140 09/23/2023 0545   NA 142 04/18/2018 0929   K 3.8 09/23/2023 0545   CL 102 09/23/2023 0545   CO2 24 09/23/2023 0545   GLUCOSE  90 09/23/2023 0545   BUN 26 (H) 09/23/2023 0545   BUN 17 04/18/2018 0929   CREATININE 1.73 (H) 09/23/2023 0545   CREATININE 1.08 06/17/2015 1626   CALCIUM 9.2 09/23/2023 0545   GFRNONAA 45 (L) 09/23/2023 0545    IMAGING past 24 hours No results found.  Vitals:   09/23/23 0520 09/23/23 0700 09/23/23 0800 09/23/23 0817  BP: (!) 161/85   (!) 169/96  Pulse: 60 68 66   Resp: 13 16 12    Temp:      TempSrc:      SpO2: 96% 96% 96%   Weight:      Height:         PHYSICAL EXAM  Temp:  [97.4 F (36.3 C)-97.6 F (36.4 C)] 97.4 F (36.3 C) (10/14 0500) Pulse Rate:  [58-78] 66 (10/14 0800) Resp:  [9-24] 12 (10/14 0800) BP: (113-169)/(63-120) 169/96 (10/14 0817) SpO2:  [92 %-98 %] 96 % (10/14 0800) Weight:  [105.9 kg] 105.9 kg (10/14 0500)  General - Well nourished, well developed, in no apparent distress. Awake and aware.   Ophthalmologic - pt has pale bluish colored rings around irises.  Cardiovascular - Regular rhythm and rate.  Neuro -  Orientated to age, place, time. No aphasia, fluent language but paucity of speech, following all simple commands, mild to moderate dysarthria. Able to name and repeat.  Left gaze preference, not able to have complete right gaze but cross midline, visual field full,  PERRL. No significant facial droop today. Tongue midline. Bilateral UEs 5/5, no drift. Bilaterally LEs 4/5, no drift. Sensation symmetrical bilaterally, b/l FTN intact grossly, gait not tested.    ASSESSMENT/PLAN  ICH - right cerebellum small ICH, etiology likely due to hypertension Code Stroke CT HEAD Acute intraparenchymal hemorrhage centered at the right cerebellum measuring 1.9 x 1.9 x 1.6 cm (estimated volume 3 mL). Intraventricular extension with small volume blood within the adjacent fourth ventricle. No hydrocephalus. Right frontal approach shunt catheter in place with tip in the right lateral ventricle. Stable ventricular size and morphology. CT head 10/11 IMPRESSION: Small  acute hemorrhage in the deep right cerebellar nuclei has increased by 1-2 mL since yesterday, now 6 mL. Mild surrounding edema.  CT repeat 10/12 stable hematoma, no hydrocephalus MRI stable cerebellar ICH, no hydrocephalus.  Old left BG ICH MRA unremarkable NSG contacted to reprogram VPS after MRI, appreciate help 2D Echo EF 60 to 65% in 05/2023 Carotid and LDL 29 HgbA1c 5.8 UDS negative VTE prophylaxis heparin subcu aspirin 81 mg daily prior to admission, now on No antithrombotic due to ICH Therapy recommendations:  CIR  Disposition: Pending  Hx of Stroke/TIA Right caudate stroke in 2011 03/2012 found to have DVT put on anticoagulation 06/2014 left basal ganglia hemorrhage with hydrocephalus status post VP shunt, had trach and PEG, anticoagulant stopped, IVC filter for bilateral DVTs  Hypertension Home meds:  hydralazine 100, metoprolol 100 Unstable Just off cleviprex On metoprolol 50->100 bid, hydralazine 100 bid-> tid Blood Pressure Goal: SBP less than 160  Long-term BP goal normotensive  Hyperlipidemia Home meds: Lipitor 80 LDL 29, goal < 70 Decrease lipitor to 40 Continue statin on discharge  Dysphagia Patient has post-stroke dysphagia SLP consulted  Now passed swallow On heart healthy diet D/c cortrak tube  Other Stroke Risk Factors Obesity, Body mass index is 31.66 kg/m., BMI >/= 30 associated with increased stroke risk, recommend weight loss, diet and exercise as appropriate  Family hx stroke (father) CAD Obstructive sleep apnea, Not on CPAP at home  Other Active Problems Reported RA Vascular dementia on Exelon capsule + memantine 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 Pt was seen and examined.   RN at the bedside.  Patient just come  back from MRI and MRA which showed stable hematoma and no hydrocephalus and intracranial vessel unremarkable.  He passed swallow, will put on diet, DC core track and tube feeding.  Neurologically unchanged and his mood much improved and no agitation so far.  PT and OT recommend CIR.  Carotid Doppler pending.  Continue statin.  For detailed assessment and plan, please refer to above/below as I have made changes wherever appropriate.   Marvel Plan, MD PhD Stroke Neurology 09/23/2023 4:12 PM   This patient is critically ill due to cerebellar ICH, history of stroke, hypertensive emergency, dysphagia and at significant risk of neurological worsening, death form hematoma expansion, brain herniation, hydrocephalus, aspiration pneumonia. This patient's care requires constant monitoring of vital signs, hemodynamics, respiratory and cardiac monitoring, review of multiple databases, neurological assessment, discussion with family, other specialists and medical decision making of high complexity. I spent 35 minutes of neurocritical care time in the care of this patient.     To contact Stroke Continuity provider, please refer to [email or web address]. After hours, contact General  Neurology

## 2023-09-24 ENCOUNTER — Inpatient Hospital Stay (HOSPITAL_COMMUNITY): Payer: Medicare (Managed Care)

## 2023-09-24 DIAGNOSIS — I10 Essential (primary) hypertension: Secondary | ICD-10-CM | POA: Diagnosis not present

## 2023-09-24 DIAGNOSIS — F0390 Unspecified dementia without behavioral disturbance: Secondary | ICD-10-CM

## 2023-09-24 DIAGNOSIS — E782 Mixed hyperlipidemia: Secondary | ICD-10-CM

## 2023-09-24 DIAGNOSIS — I639 Cerebral infarction, unspecified: Secondary | ICD-10-CM

## 2023-09-24 DIAGNOSIS — I614 Nontraumatic intracerebral hemorrhage in cerebellum: Secondary | ICD-10-CM | POA: Diagnosis not present

## 2023-09-24 LAB — BASIC METABOLIC PANEL
Anion gap: 9 (ref 5–15)
BUN: 24 mg/dL — ABNORMAL HIGH (ref 6–20)
CO2: 27 mmol/L (ref 22–32)
Calcium: 9.2 mg/dL (ref 8.9–10.3)
Chloride: 103 mmol/L (ref 98–111)
Creatinine, Ser: 1.76 mg/dL — ABNORMAL HIGH (ref 0.61–1.24)
GFR, Estimated: 44 mL/min — ABNORMAL LOW (ref >60)
Glucose, Bld: 85 mg/dL (ref 70–99)
Potassium: 3.6 mmol/L (ref 3.5–5.1)
Sodium: 139 mmol/L (ref 135–145)

## 2023-09-24 LAB — CBC
HCT: 42.5 % (ref 39.0–52.0)
Hemoglobin: 13.5 g/dL (ref 13.0–17.0)
MCH: 29.1 pg (ref 26.0–34.0)
MCHC: 31.8 g/dL (ref 30.0–36.0)
MCV: 91.6 fL (ref 80.0–100.0)
Platelets: 220 10*3/uL (ref 150–400)
RBC: 4.64 MIL/uL (ref 4.22–5.81)
RDW: 13.2 % (ref 11.5–15.5)
WBC: 7.1 10*3/uL (ref 4.0–10.5)
nRBC: 0 % (ref 0.0–0.2)

## 2023-09-24 LAB — GLUCOSE, CAPILLARY
Glucose-Capillary: 149 mg/dL — ABNORMAL HIGH (ref 70–99)
Glucose-Capillary: 102 mg/dL — ABNORMAL HIGH (ref 70–99)

## 2023-09-24 NOTE — Progress Notes (Signed)
PROGRESS NOTE        PATIENT DETAILS Name: Tony Davis. Age: 62 y.o. Sex: male Date of Birth: 25-Jan-1961 Admit Date: 09/19/2023 Admitting Physician Gordy Councilman, MD PCP:[facility]  Brief Summary: Patient is a 62 y.o.  male with prior history of left basal ganglia hemorrhage complicated by hydrocephalus-s/p VP shunt placement 2015-presented with vomiting/left gaze preference-patient was found to have a right cerebellar hemorrhage-admitted to the ICU.  He was stabilized-and transferred to [facility] service on 10/15.  Significant events: 10/10>> admission by neurology to ICU 10/15>> transferred to [facility]  Significant studies: 10/10>> CT head: Acute intraparenchymal hemorrhage right cerebellum 1.9 x 1.9 x 1.6 cm. 10/11>> CT head: Small acute hemorrhage right cerebellar-increased by 1-2 mL 10/12>> CT head: Unchanged intraparenchymal hematoma right cerebellum. 10/12>> A1c: 5.8 10/12>> LDL: 29 10/14>> MRI brain: Unchanged 2.5 cm hematoma in the cerebellum. 10/14>> MRI brain: No major intracranial arterial occlusion.  Significant microbiology data: None  Procedures: None  Consults: Neurology Neurosurgery Palliative care  Subjective: Lying comfortably in bed-denies any chest pain or shortness of breath.  Speech fluent-moving all 4 extremities.  Claims he did eat a fair amount of food yesterday.  Objective: Vitals: Blood pressure (!) 179/94, pulse 68, temperature 98.3 F (36.8 C), temperature source Oral, resp. rate 16, height 6' (1.829 m), weight 106.5 kg, SpO2 93%.   Exam: Gen Exam:Alert awake-not in any distress HEENT:atraumatic, normocephalic Chest: B/L clear to auscultation anteriorly CVS:S1S2 regular Abdomen:soft non tender, non distended Extremities:no edema Neurology: Non focal Skin: no rash  Pertinent Labs/Radiology:    Latest Ref Rng & Units 09/24/2023    3:17 AM 09/23/2023    5:45 AM  09/21/2023    8:38 AM  CBC  WBC 4.0 - 10.5 K/uL 7.1  6.6  7.5   Hemoglobin 13.0 - 17.0 g/dL 25.9  56.3  87.5   Hematocrit 39.0 - 52.0 % 42.5  42.2  42.2   Platelets 150 - 400 K/uL 220  214  245     Lab Results  Component Value Date   NA 139 09/24/2023   K 3.6 09/24/2023   CL 103 09/24/2023   CO2 27 09/24/2023    Assessment/Plan: Right cerebellar ICH Etiology thought to be due to HTN Numerous imaging studies over the past several days showing stability in ICH Supportive care for now CIR being planned Neurology following PT/OT following.  Oropharyngeal dysphagia Secondary to above SLP following-seems to be tolerating regular diet Plan is to remove NG tube today.    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   Weight as of this encounter: 106.5 kg.  Code status:   Code Status: Do not attempt resuscitation (DNR) PRE-ARREST INTERVENTIONS DESIRED   DVT Prophylaxis: heparin injection 5,000 Units Start: 09/22/23 1415 SCD's Start: 09/19/23 2326   Family Communication: None at bedside   Disposition Plan: Status is:  Inpatient Remains inpatient appropriate because: Severity of illness   Planned Discharge Destination:Rehabilitation facility-CIR   Diet: Diet Order             Diet Heart Room service appropriate? Yes with Assist; Fluid consistency: Thin  Diet effective now                     Antimicrobial agents: Anti-infectives (From admission, onward)    None        MEDICATIONS: Scheduled Meds:  atorvastatin  40 mg Oral Daily   Chlorhexidine Gluconate Cloth  6 each Topical Q0600   escitalopram  20 mg Oral Daily   heparin injection (subcutaneous)  5,000 Units Subcutaneous Q8H   hydrALAZINE  100 mg Oral Q8H   memantine  10 mg Oral BID   metoprolol tartrate  100 mg Oral BID   pantoprazole  40 mg Oral Daily   senna-docusate  1 tablet Oral BID   traZODone  100 mg Oral QHS   Continuous Infusions: PRN Meds:.acetaminophen **OR** acetaminophen (TYLENOL) oral liquid 160 mg/5 mL **OR** acetaminophen, hydrALAZINE, labetalol, mouth rinse   I have personally reviewed following labs and imaging studies  LABORATORY DATA: CBC: Recent Labs  Lab 09/19/23 2233 09/19/23 2239 09/21/23 0838 09/23/23 0545 09/24/23 0317  WBC 9.5  --  7.5 6.6 7.1  NEUTROABS 7.9*  --   --   --   --   HGB 14.6 15.3 14.3 13.4 13.5  HCT 45.2 45.0 42.2 42.2 42.5  MCV 93.0  --  90.2 94.6 91.6  PLT 244  --  245 214 220    Basic Metabolic Panel: Recent Labs  Lab 09/19/23 2233 09/19/23 2239 09/20/23 1601 09/21/23 0606 09/22/23 0306 09/23/23 0545 09/24/23 0317  NA 139 140  --  141 138 140 139  K 3.4* 3.5  --  3.4* 3.9 3.8 3.6  CL 98 98  --  99 104 102 103  CO2 29  --   --  27 25 24 27   GLUCOSE 141* 138*  --  109* 116* 90 85  BUN 16 19  --  21* 24* 26* 24*  CREATININE 1.69* 1.80*  --  1.68* 1.53* 1.73* 1.76*  CALCIUM 9.2  --   --  9.8 9.4 9.2 9.2  MG  --   --  2.0 2.1 2.0  --   --   PHOS  --   --  2.4* 2.8 2.7  --   --     GFR: Estimated Creatinine Clearance: 57 mL/min (A) (by C-G formula based  on SCr of 1.76 mg/dL (H)).  Liver Function Tests: Recent Labs  Lab 09/19/23 2233  AST 21  ALT 30  ALKPHOS 152*  BILITOT 1.0  PROT 7.0  ALBUMIN 3.8   No results for input(s): "LIPASE", "AMYLASE" in the last 168 hours. No results for input(s): "AMMONIA" in the last 168 hours.  Coagulation Profile: Recent Labs  Lab 09/19/23 2233  INR 1.1    Cardiac Enzymes: No results for input(s): "CKTOTAL", "CKMB", "CKMBINDEX", "TROPONINI" in the last 168 hours.  BNP (last 3 results) No results for input(s): "PROBNP" in the last 8760 hours.  Lipid Profile: No results for input(s): "CHOL", "HDL", "LDLCALC", "TRIG", "CHOLHDL", "LDLDIRECT" in the  last 72 hours.  Thyroid Function Tests: No results for input(s): "TSH", "T4TOTAL", "FREET4", "T3FREE", "THYROIDAB" in the last 72 hours.  Anemia Panel: No results for input(s): "VITAMINB12", "FOLATE", "FERRITIN", "TIBC", "IRON", "RETICCTPCT" in the last 72 hours.  Urine analysis:    Component Value Date/Time   COLORURINE YELLOW 03/24/2018 1848   APPEARANCEUR HAZY (A) 03/24/2018 1848   LABSPEC 1.019 03/24/2018 1848   PHURINE 6.0 03/24/2018 1848   GLUCOSEU NEGATIVE 03/24/2018 1848   GLUCOSEU NEGATIVE 12/26/2015 1659   HGBUR LARGE (A) 03/24/2018 1848   BILIRUBINUR NEGATIVE 03/24/2018 1848   KETONESUR NEGATIVE 03/24/2018 1848   PROTEINUR 100 (A) 03/24/2018 1848   UROBILINOGEN 0.2 12/26/2015 1659   NITRITE NEGATIVE 03/24/2018 1848   LEUKOCYTESUR SMALL (A) 03/24/2018 1848    Sepsis Labs: Lactic Acid, Venous    Component Value Date/Time   LATICACIDVEN 1.8 09/09/2015 2211    MICROBIOLOGY: Recent Results (from the past 240 hour(s))  MRSA Next Gen by PCR, Nasal     Status: None   Collection Time: 09/20/23  1:48 AM   Specimen: Nasal Mucosa; Nasal Swab  Result Value Ref Range Status   MRSA by PCR Next Gen NOT DETECTED NOT DETECTED Final    Comment: (NOTE) The GeneXpert MRSA Assay (FDA approved for NASAL specimens only), is one  component of a comprehensive MRSA colonization surveillance program. It is not intended to diagnose MRSA infection nor to guide or monitor treatment for MRSA infections. Test performance is not FDA approved in patients less than 37 years old. Performed at [facility], [address] [place], [place] [address]     RADIOLOGY STUDIES/RESULTS: MR BRAIN WO CONTRAST  Result Date: 09/23/2023 CLINICAL DATA:  Neuro deficit, acute, stroke suspected; Facial paralysis/weakness (CN 7). EXAM: MRI HEAD WITHOUT CONTRAST MRA HEAD WITHOUT CONTRAST TECHNIQUE: Multiplanar, multi-echo pulse sequences of the brain and surrounding structures were acquired without intravenous contrast. Angiographic images of the Circle of Willis were acquired using MRA technique without intravenous contrast. COMPARISON:  Head CT 09/21/2023. Head MRI 03/17/2012. Head MRA 08/17/2011. FINDINGS: MRI HEAD FINDINGS Brain: A right frontal approach ventriculostomy catheter terminates in the region of the frontal horn of the right lateral ventricle with unchanged size and configuration of the ventricles compared to the recent CT. There is prominent magnetic susceptibility artifact from the external component of the shunt which obscures portions of the right cerebral hemisphere, particularly on diffusion weighted and susceptibility weighted imaging limiting assessment for acute infarct and hemorrhage in the right hemisphere. A 2.5 cm parenchymal hematoma centered in the right aspect of the cerebellar vermis is unchanged from the recent CT, with unchanged mild surrounding edema and mild mass effect on the fourth ventricle. A subcentimeter area of mildly restricted diffusion extends anteriorly from the hemorrhage into the right superior cerebellar peduncle and may reflect a small acute/subacute infarct or artifact from the adjacent hemorrhage. Minimal blood products of uncertain chronicity are present in the occipital horn of the left lateral  ventricle. There is evidence of a remote hemorrhage in the left basal ganglia with encephalomalacia, ex vacuo dilatation of the left lateral ventricle, and likely chronic blood products along the third ventricle. Chronic microhemorrhages are also evident in the thalami. There is a small chronic cortical infarct in the right parietal lobe. Patchy T2 hyperintensities elsewhere in the cerebral white matter bilaterally are nonspecific but compatible with moderate chronic small vessel ischemic disease. No midline shift or extra-axial fluid collection is evident. Vascular: Major intracranial vascular flow voids are  preserved. Skull and upper cervical spine: Unremarkable bone marrow signal. Sinuses/Orbits: Unremarkable orbits. No significant inflammatory changes in the paranasal sinuses. Clear mastoid air cells. Other: None. MRA HEAD FINDINGS Anterior circulation: The internal carotid arteries are widely patent from skull base to carotid termini. ACAs and MCAs are patent without evidence of a proximal branch occlusion or significant proximal stenosis. Detail branch vessel evaluation is limited by motion artifact, and susceptibility artifact limits assessment of right MCA and distal ACA branches. No aneurysm is identified. Posterior circulation: The included portions of the intracranial vertebral arteries are patent to the basilar and codominant. Patent AICA and SCA origins are visualized bilaterally. The basilar artery is widely patent. Posterior communicating arteries are diminutive or absent. Both PCAs are patent without evidence of a significant proximal stenosis. No aneurysm or vascular malformation is identified. Anatomic variants: None. IMPRESSION: 1. Unchanged 2.5 cm parenchymal hematoma in the cerebellum with mild edema. 2. Unchanged size and configuration of the shunted ventricles. No acute hydrocephalus. 3. Moderate chronic small vessel ischemic disease. Remote left basal ganglia hemorrhage. 4. No major  intracranial arterial occlusion, significant proximal stenosis, or evidence aneurysm/vascular malformation within limitations of artifact. Electronically Signed   By: Sebastian Ache M.D.   On: 09/23/2023 15:40   MR ANGIO HEAD WO CONTRAST  Result Date: 09/23/2023 CLINICAL DATA:  Neuro deficit, acute, stroke suspected; Facial paralysis/weakness (CN 7). EXAM: MRI HEAD WITHOUT CONTRAST MRA HEAD WITHOUT CONTRAST TECHNIQUE: Multiplanar, multi-echo pulse sequences of the brain and surrounding structures were acquired without intravenous contrast. Angiographic images of the Circle of Willis were acquired using MRA technique without intravenous contrast. COMPARISON:  Head CT 09/21/2023. Head MRI 03/17/2012. Head MRA 08/17/2011. FINDINGS: MRI HEAD FINDINGS Brain: A right frontal approach ventriculostomy catheter terminates in the region of the frontal horn of the right lateral ventricle with unchanged size and configuration of the ventricles compared to the recent CT. There is prominent magnetic susceptibility artifact from the external component of the shunt which obscures portions of the right cerebral hemisphere, particularly on diffusion weighted and susceptibility weighted imaging limiting assessment for acute infarct and hemorrhage in the right hemisphere. A 2.5 cm parenchymal hematoma centered in the right aspect of the cerebellar vermis is unchanged from the recent CT, with unchanged mild surrounding edema and mild mass effect on the fourth ventricle. A subcentimeter area of mildly restricted diffusion extends anteriorly from the hemorrhage into the right superior cerebellar peduncle and may reflect a small acute/subacute infarct or artifact from the adjacent hemorrhage. Minimal blood products of uncertain chronicity are present in the occipital horn of the left lateral ventricle. There is evidence of a remote hemorrhage in the left basal ganglia with encephalomalacia, ex vacuo dilatation of the left lateral  ventricle, and likely chronic blood products along the third ventricle. Chronic microhemorrhages are also evident in the thalami. There is a small chronic cortical infarct in the right parietal lobe. Patchy T2 hyperintensities elsewhere in the cerebral white matter bilaterally are nonspecific but compatible with moderate chronic small vessel ischemic disease. No midline shift or extra-axial fluid collection is evident. Vascular: Major intracranial vascular flow voids are preserved. Skull and upper cervical spine: Unremarkable bone marrow signal. Sinuses/Orbits: Unremarkable orbits. No significant inflammatory changes in the paranasal sinuses. Clear mastoid air cells. Other: None. MRA HEAD FINDINGS Anterior circulation: The internal carotid arteries are widely patent from skull base to carotid termini. ACAs and MCAs are patent without evidence of a proximal branch occlusion or significant proximal stenosis. Detail branch vessel evaluation  is limited by motion artifact, and susceptibility artifact limits assessment of right MCA and distal ACA branches. No aneurysm is identified. Posterior circulation: The included portions of the intracranial vertebral arteries are patent to the basilar and codominant. Patent AICA and SCA origins are visualized bilaterally. The basilar artery is widely patent. Posterior communicating arteries are diminutive or absent. Both PCAs are patent without evidence of a significant proximal stenosis. No aneurysm or vascular malformation is identified. Anatomic variants: None. IMPRESSION: 1. Unchanged 2.5 cm parenchymal hematoma in the cerebellum with mild edema. 2. Unchanged size and configuration of the shunted ventricles. No acute hydrocephalus. 3. Moderate chronic small vessel ischemic disease. Remote left basal ganglia hemorrhage. 4. No major intracranial arterial occlusion, significant proximal stenosis, or evidence aneurysm/vascular malformation within limitations of artifact.  Electronically Signed   By: Sebastian Ache M.D.   On: 09/23/2023 15:40     LOS: 5 days   Jeoffrey Massed, MD  [facility]    To contact the attending provider between 7A-7P or the covering provider during after hours 7P-7A, please log into the web site [email or web address] and access using universal [place] password for that web site. If you do not have the password, please call the hospital operator.  09/24/2023, 9:34 AM

## 2023-09-24 NOTE — Care Management Important Message (Signed)
Important Message  Patient Details  Name: Tony Davis. MRN: [id number] Date of Birth: May 27, 1961   Important Message Given:  Yes - Medicare IM     Emmilyn Crooke 09/24/2023, 2:42 PM

## 2023-09-24 NOTE — Progress Notes (Signed)
Carotid duplex has been completed.    Results can be found under chart review under CV PROC. 09/24/2023 2:28 PM Ambera Fedele RVT, RDMS

## 2023-09-24 NOTE — Progress Notes (Addendum)
STROKE TEAM PROGRESS NOTE   BRIEF HPI [name]. is a 62 y.o. 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 New left-sided facial swelling. EMS reported increasing BP in transport.  CT scan on admission showed right cerebellar ICH 1.9 x 1.9 x 1.6 cm .  Right lateral ventricle shunt.   SIGNIFICANT HOSPITAL EVENTS 10/10 - admitted via ED to Cardiac ICU, CT Head as above 10/12 - CT repeat stable, palliative care meeting held, pt DNR 10/14 - MR Angio Head/Neck and MR Brain ordered, pt transferred from ICU care 10/15 - Patient doing well, Neuro Stroke team will sign off   INTERIM HISTORY/SUBJECTIVE RN at the bedside, patient snoring loudly with apneic pauses. PT reports not being on CPAP at home. Pt arousable by voice. Patient reclining in bed. Follows simple commands, left gaze and right facial droop same from yesterday. Pt now eating regular diet.   Pt reports no pain, no new weaknesses. Pt voicing desire to eat.   OBJECTIVE  CBC    Component Value Date/Time   WBC 7.1 09/24/2023 0317   RBC 4.64 09/24/2023 0317   HGB 13.5 09/24/2023 0317   HCT 42.5 09/24/2023 0317   PLT 220 09/24/2023 0317   MCV 91.6 09/24/2023 0317   MCH 29.1 09/24/2023 0317   MCHC 31.8 09/24/2023 0317   RDW 13.2 09/24/2023 0317   LYMPHSABS 1.0 09/19/2023 2233   MONOABS 0.4 09/19/2023 2233   EOSABS 0.1 09/19/2023 2233   BASOSABS 0.1 09/19/2023 2233    BMET    Component Value Date/Time   NA 139 09/24/2023 0317   NA 142 04/18/2018 0929   K 3.6 09/24/2023 0317   CL 103 09/24/2023 0317   CO2 27 09/24/2023 0317   GLUCOSE 85 09/24/2023 0317    BUN 24 (H) 09/24/2023 0317   BUN 17 04/18/2018 0929   CREATININE 1.76 (H) 09/24/2023 0317   CREATININE 1.08 06/17/2015 1626   CALCIUM 9.2 09/24/2023 0317   GFRNONAA 44 (L) 09/24/2023 0317    IMAGING past 24 hours VAS US CAROTID  Result Date: 09/24/2023 Carotid Arterial Duplex Study Patient Name:  Tony Davis.  Date of Exam:   09/24/2023 Medical Rec #: [id number]            Accession #:    [id number] Date of Birth: Jan 20, 1961           Patient Gender: M Patient Age:   64 years Exam Location:  [facility] Procedure:      VAS US CAROTID Referring Phys: Scheryl Marten Lisett Dirusso --------------------------------------------------------------------------------  Indications:       CVA. Risk Factors:      Hypertension, hyperlipidemia, past history of smoking,                    coronary artery disease, prior CVA. Other Factors:     CHF, PFO. Limitations        Today's exam was limited due to the patient's respiratory                    variation and patient movement. Comparison Study:  Previous exam on 04/15/2014 1-39% bilaterally. Performing Technologist: Ernestene Mention RVT, RDMS  Examination Guidelines: A complete  evaluation includes B-mode imaging, spectral Doppler, color Doppler, and power Doppler as needed of all accessible portions of each vessel. Bilateral testing is considered an integral part of a complete examination. Limited examinations for reoccurring indications may be performed as noted.  Right Carotid Findings: +----------+--------+--------+--------+------------------+------------------+           PSV cm/sEDV cm/sStenosisPlaque DescriptionComments           +----------+--------+--------+--------+------------------+------------------+ CCA Prox  61      12                                intimal thickening +----------+--------+--------+--------+------------------+------------------+ CCA Distal47      12                                intimal thickening  +----------+--------+--------+--------+------------------+------------------+ ICA Prox  52      20                                                   +----------+--------+--------+--------+------------------+------------------+ ICA Distal79      35                                                   +----------+--------+--------+--------+------------------+------------------+ ECA       74      9                                                    +----------+--------+--------+--------+------------------+------------------+ +----------+--------+-------+----------------+-------------------+           PSV cm/sEDV cmsDescribe        Arm Pressure (mmHG) +----------+--------+-------+----------------+-------------------+ [id number]             Multiphasic, WNL                    +----------+--------+-------+----------------+-------------------+ +---------+--------+--+--------+-+---------+ VertebralPSV cm/s35EDV cm/s8Antegrade +---------+--------+--+--------+-+---------+  Left Carotid Findings: +----------+--------+--------+--------+------------------+------------------+           PSV cm/sEDV cm/sStenosisPlaque DescriptionComments           +----------+--------+--------+--------+------------------+------------------+ CCA Prox  56      10                                intimal thickening +----------+--------+--------+--------+------------------+------------------+ CCA Distal56      12                                intimal thickening +----------+--------+--------+--------+------------------+------------------+ ICA Prox  49      14              focal and calcific                   +----------+--------+--------+--------+------------------+------------------+ ICA Distal83      30                                                   +----------+--------+--------+--------+------------------+------------------+  ECA       64      0                                                     +----------+--------+--------+--------+------------------+------------------+ +----------+--------+--------+----------------+-------------------+           PSV cm/sEDV cm/sDescribe        Arm Pressure (mmHG) +----------+--------+--------+----------------+-------------------+ [id number]              Multiphasic, WNL                    +----------+--------+--------+----------------+-------------------+ +---------+--------+--+--------+-+---------+ VertebralPSV cm/s27EDV cm/s8Antegrade +---------+--------+--+--------+-+---------+   Summary: Right Carotid: The extracranial vessels were near-normal with only minimal wall                thickening or plaque. Left Carotid: The extracranial vessels were near-normal with only minimal wall               thickening or plaque. Vertebrals:  Bilateral vertebral arteries demonstrate antegrade flow. Subclavians: Normal flow hemodynamics were seen in bilateral subclavian              arteries. *See table(s) above for measurements and observations.     Preliminary     Vitals:   09/24/23 0551 09/24/23 0800 09/24/23 1000 09/24/23 1431  BP: (!) 179/94 (!) 167/95 (!) 167/95 (!) 176/104  Pulse: 68 90 64   Resp: 16 12    Temp: 98.3 F (36.8 C) 98.4 F (36.9 C)    TempSrc: Oral Oral    SpO2: 93% 97%    Weight:      Height:         PHYSICAL EXAM  Temp:  [97.7 F (36.5 C)-98.4 F (36.9 C)] 98.4 F (36.9 C) (10/15 0800) Pulse Rate:  [63-90] 64 (10/15 1000) Resp:  [12-20] 12 (10/15 0800) BP: (141-184)/(84-104) 176/104 (10/15 1431) SpO2:  [90 %-100 %] 97 % (10/15 0800) Weight:  [106.5 kg] 106.5 kg (10/15 0347)  General - Well nourished, well developed, in no apparent distress. Awake and aware.   Ophthalmologic - pt has pale bluish colored rings around irises. Pt alternates use of eyes, concern for diplopia.  Cardiovascular - Regular rhythm and rate.  Neuro -  Orientated to age, place, time. No aphasia, fluent  language but paucity of speech, following all simple commands, mild to moderate dysarthria. Able to name and repeat.  Left gaze preference, not able to have complete right gaze but cross midline, visual field full, PERRL. Left eye mild outward gaze making eyes disconjugate. Right gaze unsustained nystagmus bilaterally. No significant facial droop. Tongue midline. Bilateral UEs 5/5, no drift. Bilaterally LEs 4/5, no drift. Sensation symmetrical bilaterally, b/l FTN intact grossly, gait not tested.    ASSESSMENT/PLAN  ICH - right cerebellum small ICH, etiology likely due to hypertension Code Stroke CT HEAD Acute intraparenchymal hemorrhage centered at the right cerebellum measuring 1.9 x 1.9 x 1.6 cm (estimated volume 3 mL). Intraventricular extension with small volume blood within the adjacent fourth ventricle. No hydrocephalus. Right frontal approach shunt catheter in place with tip in the right lateral ventricle. Stable ventricular size and morphology. CT head 10/11 IMPRESSION: Small acute hemorrhage in the deep right cerebellar nuclei has increased by 1-2 mL since yesterday, now 6 mL. Mild surrounding edema.  CT repeat 10/12 stable hematoma, no hydrocephalus  MRI stable cerebellar ICH, no hydrocephalus.  Old left BG ICH MRA unremarkable CUS unremarkable NSG reprogramed VPS after MRI, appreciate help 2D Echo EF 60 to 65% in 05/2023 LDL 29 HgbA1c 5.8 UDS negative VTE prophylaxis heparin subcu aspirin 81 mg daily prior to admission, now on No antithrombotic due to ICH Therapy recommendations:  CIR  Disposition: Pending  Hx of Stroke/TIA Right caudate stroke in 2011 03/2012 found to have DVT put on anticoagulation 06/2014 left basal ganglia hemorrhage with hydrocephalus status post VP shunt, had trach and PEG, anticoagulant stopped, IVC filter for bilateral DVTs  Hypertension Home meds:  hydralazine 100, metoprolol 100 Unstable Just off cleviprex On metoprolol 50->100 bid, hydralazine 100  bid-> tid Blood Pressure Goal: SBP less than 160  Long-term BP goal normotensive  Hyperlipidemia Home meds: Lipitor 80 LDL 29, goal < 70 Decrease lipitor to 40 Continue statin on discharge  Dysphagia Patient has post-stroke dysphagia SLP consulted  Now passed swallow On heart healthy diet D/c cortrak tube  Other Stroke Risk Factors Obesity, Body mass index is 31.84 kg/m., BMI >/= 30 associated with increased stroke risk, recommend weight loss, diet and exercise as appropriate  Family hx stroke (father) CAD Obstructive sleep apnea, Not on CPAP at home  Other Active Problems Reported RA Vascular dementia on Exelon capsule + memantine Post-stroke depression - escitalopram 20 mg CKD IIIa, creatinine 1.80->1.68->1.53->1.73->1.76  Hospital day # 5  The neuro team appreciates the consultation, and at this point we will sign off. Please message [place] if his neurological condition changes.   Signed: Christie Nottingham, MD [place] Physician 09/24/2023 2:39 PM  ATTENDING NOTE: I reviewed above note and agree with the assessment and plan. Pt was seen and examined.   No family at the bedside.  Patient was sleeping with snores on my arrival. However, easily arousable and neuro unchanged. He is asking for food. Carotid Doppler unremarkable.  Continue statin. Pending CIR.  For detailed assessment and plan, please refer to above/below as I have made changes wherever appropriate.   Neurology will sign off. Please call with questions. Pt will follow up with Dr. Marjory Lies at [facility] in about 4 weeks. Thanks for the consult.   Marvel Plan, MD PhD Stroke Neurology 09/24/2023 2:39 PM    To contact Stroke Continuity provider, please refer to [email or web address]. After hours, contact General Neurology

## 2023-09-24 NOTE — Care Management Important Message (Signed)
Important Message  Patient Details  Name: Tony Davis. MRN: [id number] Date of Birth: [birth year]   Important Message Given:  Yes - Medicare IM     Dorena Bodo 09/24/2023, 2:43 PM

## 2023-09-24 NOTE — Plan of Care (Signed)
Problem: Education: Goal: Knowledge of General Education information will improve Description: Including pain rating scale, medication(s)/side effects and non-pharmacologic comfort measures Outcome: Progressing   Problem: Clinical Measurements: Goal: Ability to maintain clinical measurements within normal limits will improve Outcome: Progressing Goal: Will remain free from infection Outcome: Progressing   Problem: Safety: Goal: Ability to remain free from injury will improve Outcome: Progressing

## 2023-09-24 NOTE — Progress Notes (Signed)
Physical Therapy Treatment Patient Details Name: Tony Davis. MRN: [id number] DOB: [birth year] Today's Date: 09/24/2023   History of Present Illness Pt is 62 year old presented to [facility] on  09/19/23 for weakness and lt gaze preference.Pt with acute intraparenchymal hemorrhage centered at the rt cerebellum. PMH - lt basal ganglia hemorrhage, hydrocephalus s/p shunt (2015), OSA, rheumatoid arthritis, vascular dementia, CAD    PT Comments  Pt received in supine, agreeable to therapy session with encouragement, with good participation once agreeable and good tolerance for transfer and seated/standing exercises. BP within goal during session and orthostatics stable. Pt needing up to modA +1 to perform transfers and pt with fair to good seated balance at EOB with CGA to Supervision for unsupported sitting during LE HEP. Pt continues to benefit from PT services to progress toward functional mobility goals.   09/24/23 1800  Vital Signs  Patient Position (if appropriate) Orthostatic Vitals  Orthostatic Lying   BP- Lying 138/87  Pulse- Lying 73  Orthostatic Sitting  BP- Sitting 155/85  Pulse- Sitting 78  Orthostatic Standing at 0 minutes  BP- Standing at 0 minutes 140/86  Pulse- Standing at 0 minutes  (did not record)      If plan is discharge home, recommend the following: A lot of help with walking and/or transfers;A lot of help with bathing/dressing/bathroom;Direct supervision/assist for medications management;Direct supervision/assist for financial management;Assist for transportation   Can travel by private vehicle        Equipment Recommendations  None recommended by PT    Recommendations for Other Services       Precautions / Restrictions Precautions Precautions: Fall;Other (comment) Precaution Comments: SBP <160 per neuro Restrictions Weight Bearing Restrictions: No     Mobility  Bed Mobility Overal bed mobility: Needs Assistance Bed Mobility: Rolling, Sidelying to  Sit, Sit to Sidelying Rolling: Min assist Sidelying to sit: Mod assist, HOB elevated, Used rails     Sit to sidelying: Min assist, Used rails General bed mobility comments: Rolling to L/R sides for repositioning of pad/pt hips. Verbal cues and intermittent tactile cues, pt needs increased time/encouragement to gaze to his R to locate top side bed rail. HHA with LUE and pt using bed rail with RUE to push to upright at EOB.    Transfers Overall transfer level: Needs assistance Equipment used: Rolling walker (2 wheels) Transfers: Sit to/from Stand Sit to Stand: Mod assist           General transfer comment: modA to power up and provided step by step sequencing cues. Stood x6 total reps with LUE HHA and R bed rail to reinforce pushing from surface rather than relying on AD to pull up to. No buckling or LOB upon standing with BUE support. Pt denies lightheadedness when standing.    Ambulation/Gait               General Gait Details: pt defers due to BLE fatigue after transfer and seated/standing exercises.   Stairs             Wheelchair Mobility     Tilt Bed    Modified Rankin (Stroke Patients Only) Modified Rankin (Stroke Patients Only) Pre-Morbid Rankin Score: Moderate disability Modified Rankin: Moderately severe disability     Balance Overall balance assessment: Needs assistance Sitting-balance support: Bilateral upper extremity supported, Feet supported Sitting balance-Leahy Scale: Fair Sitting balance - Comments: CGA for static sitting   Standing balance support: Single extremity supported Standing balance-Leahy Scale: Poor Standing balance comment: reliant on  bil UE support (bed side rail + HHA today)                            Cognition Arousal: Alert Behavior During Therapy: Flat affect Overall Cognitive Status: No family/caregiver present to determine baseline cognitive functioning Area of Impairment: Attention, Memory, Following  commands, Safety/judgement, Awareness, Problem solving, Orientation                 Orientation Level: Disoriented to, Time, Situation, Place Current Attention Level: Sustained Memory: Decreased short-term memory (no recall of info once re-oriented) Following Commands: Follows one step commands with increased time Safety/Judgement: Decreased awareness of safety, Decreased awareness of deficits Awareness: Intellectual Problem Solving: Slow processing, Requires verbal cues, Requires tactile cues, Difficulty sequencing General Comments: Pt initially reluctant to participate due to c/o BLE discomfort but will follow goal-oriented cues with encouragement such as "let's sit up so we can check your blood pressure" and "let's see if you can sit/stand 5x in a row". Pt with difficulty with motor sequencing for bed mobility especially. Pleasantly cooperative and denies BLE pain during session or at end of session but pt heels floated for comfort in bed.        Exercises Other Exercises Other Exercises: seated BLE AROM: LAQ, hip flexion x10 reps ea Other Exercises: standing BLE AROM: hip flexion x10 reps ea (BUE support) Other Exercises: STS x 5 reps reciprocal pushing from bed rail/HHA. >60 seconds to perform    General Comments General comments (skin integrity, edema, etc.): orthostatics taken (see above) and stable; HR 70's bpm with seated/standing. SpO2 WFL on RA      Pertinent Vitals/Pain Pain Assessment Pain Assessment: Faces Faces Pain Scale: Hurts a little bit Pain Location: BLE prior to session, pt reports improvement after transfer training/ROM Pain Descriptors / Indicators: Sore Pain Intervention(s): Limited activity within patient's tolerance, Monitored during session, Repositioned    Home Living                          Prior Function            PT Goals (current goals can now be found in the care plan section) Acute Rehab PT Goals Patient Stated Goal: Less  pain in my legs PT Goal Formulation: Patient unable to participate in goal setting Time For Goal Achievement: 10/05/23 Progress towards PT goals: Progressing toward goals    Frequency    Min 1X/week      PT Plan      Co-evaluation              AM-PAC PT "6 Clicks" Mobility   Outcome Measure  Help needed turning from your back to your side while in a flat bed without using bedrails?: A Lot Help needed moving from lying on your back to sitting on the side of a flat bed without using bedrails?: A Lot Help needed moving to and from a bed to a chair (including a wheelchair)?: A Lot Help needed standing up from a chair using your arms (e.g., wheelchair or bedside chair)?: A Lot Help needed to walk in hospital room?: Total Help needed climbing 3-5 steps with a railing? : Total 6 Click Score: 10    End of Session Equipment Utilized During Treatment: Gait belt Activity Tolerance: Patient tolerated treatment well Patient left: with call bell/phone within reach;in bed;with bed alarm set;Other (comment) (pt heels floated, bed in chair posture)  Nurse Communication: Mobility status PT Visit Diagnosis: Unsteadiness on feet (R26.81);Other abnormalities of gait and mobility (R26.89);Other symptoms and signs involving the nervous system (R29.898)     Time: 4098-1191 PT Time Calculation (min) (ACUTE ONLY): 24 min  Charges:    $Therapeutic Exercise: 8-22 mins $Therapeutic Activity: 8-22 mins PT General Charges $$ ACUTE PT VISIT: 1 Visit                     Takiera Mayo P., PTA Acute Rehabilitation Services Secure Chat Preferred 9a-5:30pm Office: [phone]    Dorathy Kinsman [facility] 09/24/2023, 7:31 PM

## 2023-09-24 NOTE — Progress Notes (Signed)
Inpatient Rehab Admissions Coordinator:    Continue to await insurance auth for CIR. Will follow for potential admit pending auth.   Megan Salon, MS, CCC-SLP Rehab Admissions Coordinator  [phone] (celll) [phone] (office)

## 2023-09-24 NOTE — Progress Notes (Signed)
Nutrition Follow-up  DOCUMENTATION CODES:   Not applicable  INTERVENTION:  Liberalize diet   NUTRITION DIAGNOSIS:   Inadequate oral intake related to acute illness as evidenced by NPO status.    GOAL:   Patient will meet greater than or equal to 90% of their needs    MONITOR:   Diet advancement, TF tolerance, Weight trends  REASON FOR ASSESSMENT:   Consult Assessment of nutrition requirement/status, Enteral/tube feeding initiation and management, Poor PO  ASSESSMENT:   62 yo male admitted with acute intraparenchymal hemorrhage. PMH includes IVH with shunt, HTN, HLD, GERD, OSA, vascular dementia, CAD RDN consulted related to possible nocturnal feedings to help improve day time po intake.  Pt very tired fell asleep a couple of times during conversation.  Stated they do not let him eat what he wants.  MD suspects discharge tomorrow. Good appetite.  There is no benefit to excessive dietary  restrictions related to advanced age and no compliance.  10/11- cortrak placed 10/14- diet advanced to heart healthy 10/14 Cortrak clamped. 10/15- Cortrak had been removed.  Tube Feeding via Cortrak: Osmolite 1.5 at 60 ml/hr Pro-Source TF20 60 mL daily TF provides 2240 kcals, 110 g of protein, 1094 mL of free water   Additional free water of 200 mL q 4 hours 2294 mL of free water   Additional lipid calories via Cleviprex  Admit weight: 108.7 kg Current weight: 106.5 kg Weight since admit; Date/Time Weight Weight in lbs BSA (Calculated - sq m) BMI (Calculated) Who  09/24/23 0347 106.5 kg 234.79 lbs -- 31.84 PS  09/23/23 0500 105.9 kg 233.47 lbs -- 31.66 MO  09/22/23 0500 106.9 kg 235.67 lbs 2.33 sq meters 31.96 KC  09/21/23 0709 108.7 kg 239.64 lbs -- -- JS  09/20/23 0200 108.7 kg 239.64 lbs      Weight history: 09/24/23 106.5 kg  07/29/23 110.7 kg  07/18/23 111.5 kg  07/12/23 111.5 kg  05/14/23 111.5 kg  01/18/23 116 kg  01/11/23 116 kg  01/08/23 116.5 kg        Average Meal Intake: Last documented 09/21/23- 0%  Nutritionally Relevant Medications: Scheduled Meds:  hydrALAZINE  100 mg Oral Q8H   memantine  10 mg Oral BID   pantoprazole  40 mg Oral Daily   senna-docusate  1 tablet Oral BID   traZODone  100 mg Oral QHS      Labs Reviewed:  HgbA1c 5.8    NUTRITION - FOCUSED PHYSICAL EXAM:  Flowsheet Row Most Recent Value  Orbital Region No depletion  Upper Arm Region No depletion  Thoracic and Lumbar Region No depletion  Buccal Region No depletion  Temple Region No depletion  Clavicle Bone Region No depletion  Clavicle and Acromion Bone Region No depletion  Scapular Bone Region No depletion  Dorsal Hand No depletion  Patellar Region No depletion  Anterior Thigh Region No depletion  Posterior Calf Region No depletion  Edema (RD Assessment) None  Hair Reviewed  Eyes Reviewed  Mouth Reviewed  Skin Reviewed  Nails Reviewed       Diet Order:   Diet Order             Diet Heart Room service appropriate? Yes with Assist; Fluid consistency: Thin  Diet effective now                   EDUCATION NEEDS:   Not appropriate for education at this time  Skin:  Skin Assessment: Reviewed RN Assessment  Last BM:  10/12 +  smear, +flatus, abd soft  Height:   Ht Readings from Last 1 Encounters:  09/22/23 6' (1.829 m)    Weight:   Wt Readings from Last 1 Encounters:  09/24/23 106.5 kg    Ideal Body Weight:     BMI:  Body mass index is 31.84 kg/m.  Estimated Nutritional Needs:   Kcal:  2000-2300 kcals  Protein:  100-115 g  Fluid:  >/= 2L    Jamelle Haring RDN, LDN Clinical Dietitian  RDN pager # available on Amion

## 2023-09-25 DIAGNOSIS — N183 Chronic kidney disease, stage 3 unspecified: Secondary | ICD-10-CM

## 2023-09-25 DIAGNOSIS — I614 Nontraumatic intracerebral hemorrhage in cerebellum: Secondary | ICD-10-CM | POA: Diagnosis not present

## 2023-09-25 DIAGNOSIS — I10 Essential (primary) hypertension: Secondary | ICD-10-CM | POA: Diagnosis not present

## 2023-09-25 LAB — GLUCOSE, CAPILLARY
Glucose-Capillary: 92 mg/dL (ref 70–99)
Glucose-Capillary: 98 mg/dL (ref 70–99)
Glucose-Capillary: 147 mg/dL — ABNORMAL HIGH (ref 70–99)

## 2023-09-25 NOTE — [place] FL2 (Signed)
[place] MEDICAID FL2 LEVEL OF CARE FORM     IDENTIFICATION  Patient Name: Tony Davis. Birthdate: [birth year] Sex: male Admission Date (Current Location): 09/19/2023  [facility] and [place] Number:  Producer, television/film/video and Address:  [facility] [place], [address], [place], [place] [address]      Provider Number: [id number]  Attending Physician Name and Address:  Maretta Bees, MD  Relative Name and Phone Number:  Tony Davis; Wife; [phone]    Current Level of Care: Hospital Recommended Level of Care: Skilled Nursing Facility Prior Approval Number:    Date Approved/Denied:   PASRR Number: [id number]  Discharge Plan: SNF    Current Diagnoses: Patient Active Problem List   Diagnosis Date Noted   Cerebellar hemorrhage (HCC) 09/19/2023   Left renal mass 01/18/2023   Obesity (BMI 30-39.9) 04/21/2019   Altered mental status 09/20/2018   Hypokalemia 12/26/2015   Depression 02/03/2015   Diastolic dysfunction with chronic heart failure (HCC) 11/30/2014   Essential hypertension 11/03/2014   LV dysfunction    Hydrocephalus (HCC)    Basal ganglia hemorrhage (HCC) 07/30/2014   Routine general medical examination at a health care facility 02/11/2014   Insomnia, persistent 03/25/2013   Chronic rhinitis 03/25/2013   Hemiparesis due to old cerebral infarction (HCC) 03/20/2013   Folate deficiency anemia 10/13/2012   Long term (current) use of anticoagulants 03/28/2012   Obstructive sleep apnea 08/16/2011   Prediabetes 08/06/2011   PATENT FORAMEN OVALE 03/13/2010   CAD, NATIVE VESSEL 01/26/2010   Hyperlipidemia with target LDL less than 100 01/23/2010   GERD 01/23/2010    Orientation RESPIRATION BLADDER Height & Weight     Self, Time, Place  Normal (Room Air) Incontinent, External catheter Weight: 234 lb 12.6 oz (106.5 kg) Height:  6' (182.9 cm)  BEHAVIORAL SYMPTOMS/MOOD NEUROLOGICAL BOWEL NUTRITION STATUS      Incontinent Diet  (Please see dc summary)  AMBULATORY STATUS COMMUNICATION OF NEEDS Skin   Extensive Assist Verbally Normal                       Personal Care Assistance Level of Assistance  Bathing, Feeding, Dressing Bathing Assistance: Maximum assistance Feeding assistance: Maximum assistance Dressing Assistance: Maximum assistance     Functional Limitations Info  Sight Sight Info: Impaired (Reading glasses)        SPECIAL CARE FACTORS FREQUENCY  PT (By licensed PT), OT (By licensed OT)     PT Frequency: 5x OT Frequency: 5x            Contractures Contractures Info: Not present    Additional Factors Info  Code Status, Allergies Code Status Info: DNR PRE-ARREST INTERVENTIONS DESIRED Allergies Info: Iodine; Norvasc (amlodipine); Aricept (donepezil)           Current Medications (09/25/2023):  This is the current hospital active medication list Current Facility-Administered Medications  Medication Dose Route Frequency Provider Last Rate Last Admin   acetaminophen (TYLENOL) tablet 650 mg  650 mg Oral Q4H PRN Bhagat, Srishti L, MD   650 mg at 09/20/23 1106   Or   acetaminophen (TYLENOL) 160 MG/5ML solution 650 mg  650 mg Per Tube Q4H PRN Bhagat, Srishti L, MD       Or   acetaminophen (TYLENOL) suppository 650 mg  650 mg Rectal Q4H PRN Bhagat, Srishti L, MD       atorvastatin (LIPITOR) tablet 40 mg  40 mg Oral Daily Margaretmary Dys, MD  40 mg at 09/25/23 0919   Chlorhexidine Gluconate Cloth 2 % PADS 6 each  6 each Topical Q0600 Bhagat, Srishti L, MD   6 each at 09/25/23 0530   escitalopram (LEXAPRO) tablet 20 mg  20 mg Oral Daily Margaretmary Dys, MD   20 mg at 09/25/23 0919   heparin injection 5,000 Units  5,000 Units Subcutaneous Buena Irish, MD   5,000 Units at 09/25/23 0515   hydrALAZINE (APRESOLINE) injection 10 mg  10 mg Intravenous Q4H PRN Paliwal, Eliezer Lofts, MD   10 mg at 09/23/23 0817   hydrALAZINE (APRESOLINE) tablet 100 mg  100 mg Oral  Q8H Margaretmary Dys, MD   100 mg at 09/25/23 0515   memantine (NAMENDA) tablet 10 mg  10 mg Oral BID Margaretmary Dys, MD   10 mg at 09/25/23 0919   metoprolol tartrate (LOPRESSOR) tablet 100 mg  100 mg Oral BID Margaretmary Dys, MD   100 mg at 09/25/23 0919   Oral care mouth rinse  15 mL Mouth Rinse PRN Paliwal, Aditya, MD       pantoprazole (PROTONIX) EC tablet 40 mg  40 mg Oral Daily Marvel Plan, MD   40 mg at 09/25/23 0919   senna-docusate (Senokot-S) tablet 1 tablet  1 tablet Oral BID Margaretmary Dys, MD   1 tablet at 09/25/23 0919   traZODone (DESYREL) tablet 100 mg  100 mg Oral Barbee Shropshire, MD   100 mg at 09/24/23 2203     Discharge Medications: Please see discharge summary for a list of discharge medications.  Relevant Imaging Results:  Relevant Lab Results:   Additional Information SS# [id number]  [name], LCSW

## 2023-09-25 NOTE — Plan of Care (Signed)
Problem: Education: Goal: Knowledge of disease or condition will improve 09/25/2023 0602 by Pamala Duffel, RN Outcome: Progressing 09/25/2023 0353 by Pamala Duffel, RN Outcome: Progressing Goal: Knowledge of secondary prevention will improve (MUST DOCUMENT ALL) 09/25/2023 0602 by Pamala Duffel, RN Outcome: Progressing 09/25/2023 0353 by Pamala Duffel, RN Outcome: Progressing Goal: Knowledge of patient specific risk factors will improve Loraine Leriche N/A or DELETE if not current risk factor) 09/25/2023 0602 by Pamala Duffel, RN Outcome: Progressing 09/25/2023 0353 by Pamala Duffel, RN Outcome: Progressing   Problem: Intracerebral Hemorrhage Tissue Perfusion: Goal: Complications of Intracerebral Hemorrhage will be minimized 09/25/2023 0602 by Pamala Duffel, RN Outcome: Progressing 09/25/2023 0353 by Pamala Duffel, RN Outcome: Progressing   Problem: Coping: Goal: Will verbalize positive feelings about self 09/25/2023 0602 by Pamala Duffel, RN Outcome: Progressing 09/25/2023 0353 by Pamala Duffel, RN Outcome: Progressing Goal: Will identify appropriate support needs 09/25/2023 0602 by Pamala Duffel, RN Outcome: Progressing 09/25/2023 0353 by Pamala Duffel, RN Outcome: Progressing   Problem: Health Behavior/Discharge Planning: Goal: Ability to manage health-related needs will improve 09/25/2023 0602 by Pamala Duffel, RN Outcome: Progressing 09/25/2023 0353 by Pamala Duffel, RN Outcome: Progressing Goal: Goals will be collaboratively established with patient/family 09/25/2023 0602 by Pamala Duffel, RN Outcome: Progressing 09/25/2023 0353 by Pamala Duffel, RN Outcome: Progressing   Problem: Self-Care: Goal: Ability to participate in self-care as condition permits will improve 09/25/2023 0602 by Pamala Duffel, RN Outcome: Progressing 09/25/2023 0353 by Pamala Duffel, RN Outcome: Progressing Goal: Verbalization of feelings and concerns over difficulty with self-care will improve 09/25/2023 0602 by Pamala Duffel, RN Outcome: Progressing 09/25/2023 0353 by Pamala Duffel, RN Outcome: Progressing Goal: Ability to communicate needs accurately will improve 09/25/2023 0602 by Pamala Duffel, RN Outcome: Progressing 09/25/2023 0353 by Pamala Duffel, RN Outcome: Progressing   Problem: Nutrition: Goal: Risk of aspiration will decrease 09/25/2023 0602 by Pamala Duffel, RN Outcome: Progressing 09/25/2023 0353 by Pamala Duffel, RN Outcome: Progressing Goal: Dietary intake will improve 09/25/2023 0602 by Pamala Duffel, RN Outcome: Progressing 09/25/2023 0353 by Pamala Duffel, RN Outcome: Progressing   Problem: Education: Goal: Knowledge of General Education information will improve Description: Including pain rating scale, medication(s)/side effects and non-pharmacologic comfort measures 09/25/2023 0602 by Pamala Duffel, RN Outcome: Progressing 09/25/2023 0353 by Pamala Duffel, RN Outcome: Progressing   Problem: Health Behavior/Discharge Planning: Goal: Ability to manage health-related needs will improve 09/25/2023 0602 by Pamala Duffel, RN Outcome: Progressing 09/25/2023 0353 by Pamala Duffel, RN Outcome: Progressing   Problem: Clinical Measurements: Goal: Ability to maintain clinical measurements within normal limits will improve 09/25/2023 0602 by Pamala Duffel, RN Outcome: Progressing 09/25/2023 0353 by Pamala Duffel, RN Outcome: Progressing Goal: Will remain free from infection 09/25/2023 0602 by Pamala Duffel, RN Outcome: Progressing 09/25/2023 0353 by Pamala Duffel, RN Outcome: Progressing Goal: Diagnostic test results will improve 09/25/2023 0602 by Pamala Duffel, RN Outcome: Progressing 09/25/2023 0353  by Pamala Duffel, RN Outcome: Progressing Goal: Respiratory complications will improve 09/25/2023 0602 by Pamala Duffel, RN Outcome: Progressing 09/25/2023 0353 by Pamala Duffel, RN Outcome: Progressing Goal: Cardiovascular complication will be avoided 09/25/2023 0602 by Pamala Duffel, RN Outcome: Progressing 09/25/2023 0353 by Pamala Duffel, RN Outcome: Progressing   Problem: Activity: Goal: Risk for activity intolerance will decrease 09/25/2023 0602 by Pamala Duffel, RN Outcome: Progressing  09/25/2023 0353 by Pamala Duffel, RN Outcome: Progressing   Problem: Nutrition: Goal: Adequate nutrition will be maintained 09/25/2023 0602 by Pamala Duffel, RN Outcome: Progressing 09/25/2023 0353 by Pamala Duffel, RN Outcome: Progressing   Problem: Coping: Goal: Level of anxiety will decrease 09/25/2023 0602 by Pamala Duffel, RN Outcome: Progressing 09/25/2023 0353 by Pamala Duffel, RN Outcome: Progressing   Problem: Elimination: Goal: Will not experience complications related to bowel motility 09/25/2023 0602 by Pamala Duffel, RN Outcome: Progressing 09/25/2023 0353 by Pamala Duffel, RN Outcome: Progressing Goal: Will not experience complications related to urinary retention 09/25/2023 0602 by Pamala Duffel, RN Outcome: Progressing 09/25/2023 0353 by Pamala Duffel, RN Outcome: Progressing   Problem: Pain Managment: Goal: General experience of comfort will improve 09/25/2023 0602 by Pamala Duffel, RN Outcome: Progressing 09/25/2023 0353 by Pamala Duffel, RN Outcome: Progressing   Problem: Safety: Goal: Ability to remain free from injury will improve 09/25/2023 0602 by Pamala Duffel, RN Outcome: Progressing 09/25/2023 0353 by Pamala Duffel, RN Outcome: Progressing   Problem: Skin Integrity: Goal: Risk for impaired skin integrity will  decrease 09/25/2023 0602 by Pamala Duffel, RN Outcome: Progressing 09/25/2023 0353 by Pamala Duffel, RN Outcome: Progressing

## 2023-09-25 NOTE — TOC Progression Note (Addendum)
Transition of Care (TOC) - Progression Note    Patient Details  Name: Tony Davis. MRN: [id number] Date of Birth: 05-Feb-1961  Transition of Care [facility]) CM/SW Contact  Mearl Latin, LCSW Phone Number: 09/25/2023, 2:38 PM  Clinical Narrative:    2:38pm-CSW received denial for CIR. CSW spoke with patient's [facility] Social Worker, Bri [phone]). She reported agreement with SNF. CSW provided [facility] contracted bed offers and she stated to proceed with [facility]. CSW made [facility] aware and [facility] will send over needed paperwork in preparation for discharge tomorrow. They will coordinate with CSW for pickup time. CSW left voicemail for patient's spouse.    3:59 PM-CSW received return call from patient's spouse. She reported agreement with discharge plan to [facility].    Expected Discharge Plan: Skilled Nursing Facility Barriers to Discharge: Continued Medical Work up  Expected Discharge Plan and Services In-house Referral: Clinical Social Work Discharge Planning Services: CM Consult Post Acute Care Choice: Skilled Nursing Facility Living arrangements for the past 2 months: Single Family Home                                       Social Determinants of Health (SDOH) Interventions SDOH Screenings   Food Insecurity: No Food Insecurity (01/19/2023)  Housing: Low Risk  (01/19/2023)  Transportation Needs: No Transportation Needs (01/19/2023)  Utilities: Not At Risk (01/19/2023)  Tobacco Use: Medium Risk (07/29/2023)    Readmission Risk Interventions     No data to display

## 2023-09-25 NOTE — Progress Notes (Signed)
Inpatient Rehab Admissions Coordinator:    Case for CIR was denied. [facility] recommending SNF for Pt. And state wife in agreement.   Megan Salon, MS, CCC-SLP Rehab Admissions Coordinator  [phone] (celll) [phone] (office)

## 2023-09-25 NOTE — Plan of Care (Signed)
Problem: Education: Goal: Knowledge of disease or condition will improve Outcome: Progressing Goal: Knowledge of secondary prevention will improve (MUST DOCUMENT ALL) Outcome: Progressing Goal: Knowledge of patient specific risk factors will improve Loraine Leriche N/A or DELETE if not current risk factor) Outcome: Progressing   Problem: Intracerebral Hemorrhage Tissue Perfusion: Goal: Complications of Intracerebral Hemorrhage will be minimized Outcome: Progressing   Problem: Coping: Goal: Will verbalize positive feelings about self Outcome: Progressing Goal: Will identify appropriate support needs Outcome: Progressing   Problem: Health Behavior/Discharge Planning: Goal: Ability to manage health-related needs will improve Outcome: Progressing Goal: Goals will be collaboratively established with patient/family Outcome: Progressing   Problem: Self-Care: Goal: Ability to participate in self-care as condition permits will improve Outcome: Progressing Goal: Verbalization of feelings and concerns over difficulty with self-care will improve Outcome: Progressing Goal: Ability to communicate needs accurately will improve Outcome: Progressing   Problem: Nutrition: Goal: Risk of aspiration will decrease Outcome: Progressing Goal: Dietary intake will improve Outcome: Progressing   Problem: Education: Goal: Knowledge of General Education information will improve Description: Including pain rating scale, medication(s)/side effects and non-pharmacologic comfort measures Outcome: Progressing   Problem: Health Behavior/Discharge Planning: Goal: Ability to manage health-related needs will improve Outcome: Progressing   Problem: Clinical Measurements: Goal: Ability to maintain clinical measurements within normal limits will improve Outcome: Progressing Goal: Will remain free from infection Outcome: Progressing Goal: Diagnostic test results will improve Outcome: Progressing Goal:  Respiratory complications will improve Outcome: Progressing Goal: Cardiovascular complication will be avoided Outcome: Progressing   Problem: Activity: Goal: Risk for activity intolerance will decrease Outcome: Progressing   Problem: Nutrition: Goal: Adequate nutrition will be maintained Outcome: Progressing   Problem: Coping: Goal: Level of anxiety will decrease Outcome: Progressing   Problem: Elimination: Goal: Will not experience complications related to bowel motility Outcome: Progressing Goal: Will not experience complications related to urinary retention Outcome: Progressing   Problem: Pain Managment: Goal: General experience of comfort will improve Outcome: Progressing   Problem: Safety: Goal: Ability to remain free from injury will improve Outcome: Progressing   Problem: Skin Integrity: Goal: Risk for impaired skin integrity will decrease Outcome: Progressing

## 2023-09-25 NOTE — Plan of Care (Signed)
  Problem: Coping: Goal: Will identify appropriate support needs Outcome: Progressing   Problem: Self-Care: Goal: Verbalization of feelings and concerns over difficulty with self-care will improve Outcome: Progressing   Problem: Nutrition: Goal: Risk of aspiration will decrease Outcome: Progressing   Problem: Clinical Measurements: Goal: Will remain free from infection Outcome: Progressing

## 2023-09-25 NOTE — Progress Notes (Signed)
Speech Language Pathology Treatment: Cognitive-Linquistic  Patient Details Name: Tony Davis. MRN: [id number] DOB: 05-Oct-1961 Today's Date: 09/25/2023 Time: 1308-6578 SLP Time Calculation (min) (ACUTE ONLY): 19 min  Assessment / Plan / Recommendation Clinical Impression  Pt seen for cognitive intervention focusing on orientation, problem solving and memory. Pt required significant cueing throughout the session including situation (stroke), year, month. Reminders given to use clock on wall to orient to month, day of week. He was able to state he was at [facility] and for intellectual awareness stated his thinking is different since stroke. He needed cues to state use of call bell and reported he would "hollar". He did locate the channel changer and volume independently. He has significant working memory impairments and needed max reminders with delay of several minutes. He was able to recall he had a stroke towards end of session. Pt added sums of fake money with 100% accuracy and to subtract he required mild cueing and extra time. Pt may benefit from written information for memory (has visual disturbance in left eye). Pt has history of cognitive impairments from prior strokes and no family present to compare to baseline. Will continue ST.     HPI HPI: Pt is a 62 y.o with a past medical history significant for hypertension, hyperlipidemia, left basal ganglia hemorrhage with intraventricular extension complicated by hydrocephalus s/p shunt (2015), obstructive sleep apnea on CPAP, BMI 33, rheumatoid arthritis on chart, vascular dementia, remote smoking, coronary artery disease, prediabetes.  Pt presents with hypertension and worsening left gaze preference. Head CT was remarkable for an acute intraparenchymal hemorrhage centered at the right cerebellum.  Pt with a hx of dysphagia and cognitive-linguistic deficits.      SLP Plan  Continue with current plan of care      Recommendations for follow up  therapy are one component of a multi-disciplinary discharge planning process, led by the attending physician.  Recommendations may be updated based on patient status, additional functional criteria and insurance authorization.    Recommendations                     Oral care BID   Frequent or constant Supervision/Assistance Cognitive communication deficit (I69.629)     Continue with current plan of care     Royce Macadamia  09/25/2023, 2:44 PM

## 2023-09-25 NOTE — Progress Notes (Signed)
PROGRESS NOTE        PATIENT DETAILS Name: Tony Davis. Age: 62 y.o. Sex: male Date of Birth: [birth year] Admit Date: 09/19/2023 Admitting Physician Gordy Councilman, MD PCP:Inc, [facility], [place]  Brief Summary: Patient is a 62 y.o.  male with prior history of left basal ganglia hemorrhage complicated by hydrocephalus-s/p VP shunt placement 2015-presented with vomiting/left gaze preference-patient was found to have a right cerebellar hemorrhage-admitted to the ICU.  He was stabilized-and transferred to [facility] service on 10/15.  Significant events: 10/10>> admission by neurology to ICU 10/15>> transferred to [facility]  Significant studies: 10/10>> CT head: Acute intraparenchymal hemorrhage right cerebellum 1.9 x 1.9 x 1.6 cm. 10/11>> CT head: Small acute hemorrhage right cerebellar-increased by 1-2 mL 10/12>> CT head: Unchanged intraparenchymal hematoma right cerebellum. 10/12>> A1c: 5.8 10/12>> LDL: 29 10/14>> MRI brain: Unchanged 2.5 cm hematoma in the cerebellum. 10/14>> MRI brain: No major intracranial arterial occlusion.  Significant microbiology data: None  Procedures: None  Consults: Neurology Neurosurgery Palliative care  Subjective: No complaint-no major issues overnight-lying comfortably in bed.  Awaiting CIR bed.  Objective: Vitals: Blood pressure (!) 124/104, pulse 60, temperature 98.7 F (37.1 C), temperature source Oral, resp. rate 11, height 6' (1.829 m), weight 106.5 kg, SpO2 100%.   Exam: Awake/alert-not in any distress.  Pertinent Labs/Radiology:    Latest Ref Rng & Units 09/24/2023    3:17 AM 09/23/2023    5:45 AM 09/21/2023    8:38 AM  CBC  WBC 4.0 - 10.5 K/uL 7.1  6.6  7.5   Hemoglobin 13.0 - 17.0 g/dL 16.1  09.6  04.5   Hematocrit 39.0 - 52.0 % 42.5  42.2  42.2   Platelets 150 - 400 K/uL 220  214  245     Lab Results  Component Value Date   NA 139 09/24/2023   K 3.6 09/24/2023   CL  103 09/24/2023   CO2 27 09/24/2023    Assessment/Plan: Right cerebellar ICH Etiology thought to be due to HTN Numerous imaging studies over the past several days showing stability in ICH Supportive care for now CIR being planned awaiting insurance authorization Neurology following PT/OT following.  Oropharyngeal dysphagia Secondary to above SLP following-seems to be tolerating regular diet NG tube discontinued 10/15.  History of CAD-s/p PCI No anginal symptoms Not a candidate for antiplatelets Continue statin/beta-blocker  History of VTE-IVC filter in place Not a anticoagulation candidate  HTN Better controlled Continue metoprolol/hydralazine/amlodipine.  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   Weight as of this encounter: 106.5 kg.   Code status:   Code Status: Do not attempt resuscitation (DNR) PRE-ARREST INTERVENTIONS DESIRED   DVT Prophylaxis: heparin injection 5,000 Units Start: 09/22/23 1415 SCD's Start: 09/19/23 2326   Family Communication: None at bedside   Disposition Plan: Status is: Inpatient Remains  Component Value Date/Time   COLORURINE YELLOW 03/24/2018 1848   APPEARANCEUR HAZY (A) 03/24/2018 1848   LABSPEC 1.019 03/24/2018 1848   PHURINE 6.0 03/24/2018 1848   GLUCOSEU NEGATIVE 03/24/2018 1848   GLUCOSEU NEGATIVE 12/26/2015 1659   HGBUR LARGE (A) 03/24/2018 1848   BILIRUBINUR NEGATIVE 03/24/2018 1848   KETONESUR NEGATIVE 03/24/2018 1848   PROTEINUR 100 (A) 03/24/2018 1848   UROBILINOGEN 0.2 12/26/2015 1659   NITRITE NEGATIVE 03/24/2018 1848   LEUKOCYTESUR SMALL (A) 03/24/2018 1848    Sepsis Labs: Lactic Acid, Venous    Component Value Date/Time   LATICACIDVEN 1.8 09/09/2015 2211    MICROBIOLOGY: Recent Results (from the past 240 hour(s))  MRSA Next Gen by PCR, Nasal     Status: None   Collection Time: 09/20/23  1:48 AM   Specimen: Nasal Mucosa; Nasal Swab  Result Value Ref Range Status   MRSA by PCR Next Gen NOT DETECTED NOT DETECTED Final    Comment: (NOTE) The GeneXpert MRSA Assay (FDA approved for NASAL specimens only), is one component of a comprehensive MRSA colonization surveillance program. It is not intended to diagnose MRSA infection nor to guide or monitor treatment for MRSA infections. Test performance is not FDA approved in patients less than 53 years old. Performed at [facility], [address], [place], [place] [address]     RADIOLOGY STUDIES/RESULTS: VAS US CAROTID  Result Date: 09/24/2023 Carotid Arterial Duplex Study Patient Name:  Foday Cone.  Date of Exam:   09/24/2023 Medical Rec #: [id number]            Accession #:    [id number] Date of Birth: [birth year]           Patient Gender: M Patient Age:   89 years Exam Location:  [facility] Procedure:      VAS US CAROTID Referring Phys: Scheryl Marten XU --------------------------------------------------------------------------------  Indications:       CVA. Risk Factors:      Hypertension, hyperlipidemia, past history of smoking,                    coronary artery disease, prior CVA. Other Factors:     CHF, PFO. Limitations        Today's exam was limited due to the patient's respiratory                    variation and patient movement. Comparison Study:  Previous exam on 04/15/2014 1-39% bilaterally. Performing Technologist: Ernestene Mention RVT, RDMS  Examination Guidelines: A complete evaluation includes B-mode imaging, spectral Doppler, color Doppler, and power Doppler as needed of all accessible portions of each vessel. Bilateral testing is considered an integral part of a complete examination. Limited examinations for reoccurring indications may be performed as noted.  Right Carotid Findings: +----------+--------+--------+--------+------------------+------------------+           PSV cm/sEDV cm/sStenosisPlaque DescriptionComments           +----------+--------+--------+--------+------------------+------------------+ CCA Prox  61      12                                intimal thickening +----------+--------+--------+--------+------------------+------------------+ CCA Distal47      12                                intimal thickening +----------+--------+--------+--------+------------------+------------------+ ICA Prox  PROGRESS NOTE        PATIENT DETAILS Name: Tony Davis. Age: 62 y.o. Sex: male Date of Birth: [birth year] Admit Date: 09/19/2023 Admitting Physician Gordy Councilman, MD PCP:Inc, [facility] [place]  Brief Summary: Patient is a 62 y.o.  male with prior history of left basal ganglia hemorrhage complicated by hydrocephalus-s/p VP shunt placement 2015-presented with vomiting/left gaze preference-patient was found to have a right cerebellar hemorrhage-admitted to the ICU.  He was stabilized-and transferred to [facility] service on 10/15.  Significant events: 10/10>> admission by neurology to ICU 10/15>> transferred to [facility]  Significant studies: 10/10>> CT head: Acute intraparenchymal hemorrhage right cerebellum 1.9 x 1.9 x 1.6 cm. 10/11>> CT head: Small acute hemorrhage right cerebellar-increased by 1-2 mL 10/12>> CT head: Unchanged intraparenchymal hematoma right cerebellum. 10/12>> A1c: 5.8 10/12>> LDL: 29 10/14>> MRI brain: Unchanged 2.5 cm hematoma in the cerebellum. 10/14>> MRI brain: No major intracranial arterial occlusion.  Significant microbiology data: None  Procedures: None  Consults: Neurology Neurosurgery Palliative care  Subjective: No complaint-no major issues overnight-lying comfortably in bed.  Awaiting CIR bed.  Objective: Vitals: Blood pressure (!) 124/104, pulse 60, temperature 98.7 F (37.1 C), temperature source Oral, resp. rate 11, height 6' (1.829 m), weight 106.5 kg, SpO2 100%.   Exam: Awake/alert-not in any distress.  Pertinent Labs/Radiology:    Latest Ref Rng & Units 09/24/2023    3:17 AM 09/23/2023    5:45 AM 09/21/2023    8:38 AM  CBC  WBC 4.0 - 10.5 K/uL 7.1  6.6  7.5   Hemoglobin 13.0 - 17.0 g/dL 16.1  09.6  04.5   Hematocrit 39.0 - 52.0 % 42.5  42.2  42.2   Platelets 150 - 400 K/uL 220  214  245     Lab Results  Component Value Date   NA 139 09/24/2023   K 3.6 09/24/2023   CL  103 09/24/2023   CO2 27 09/24/2023    Assessment/Plan: Right cerebellar ICH Etiology thought to be due to HTN Numerous imaging studies over the past several days showing stability in ICH Supportive care for now CIR being planned awaiting insurance authorization Neurology following PT/OT following.  Oropharyngeal dysphagia Secondary to above SLP following-seems to be tolerating regular diet NG tube discontinued 10/15.  History of CAD-s/p PCI No anginal symptoms Not a candidate for antiplatelets Continue statin/beta-blocker  History of VTE-IVC filter in place Not a anticoagulation candidate  HTN Better controlled Continue metoprolol/hydralazine/amlodipine.  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   Weight as of this encounter: 106.5 kg.   Code status:   Code Status: Do not attempt resuscitation (DNR) PRE-ARREST INTERVENTIONS DESIRED   DVT Prophylaxis: heparin injection 5,000 Units Start: 09/22/23 1415 SCD's Start: 09/19/23 2326   Family Communication: None at bedside   Disposition Plan: Status is: Inpatient Remains  Component Value Date/Time   COLORURINE YELLOW 03/24/2018 1848   APPEARANCEUR HAZY (A) 03/24/2018 1848   LABSPEC 1.019 03/24/2018 1848   PHURINE 6.0 03/24/2018 1848   GLUCOSEU NEGATIVE 03/24/2018 1848   GLUCOSEU NEGATIVE 12/26/2015 1659   HGBUR LARGE (A) 03/24/2018 1848   BILIRUBINUR NEGATIVE 03/24/2018 1848   KETONESUR NEGATIVE 03/24/2018 1848   PROTEINUR 100 (A) 03/24/2018 1848   UROBILINOGEN 0.2 12/26/2015 1659   NITRITE NEGATIVE 03/24/2018 1848   LEUKOCYTESUR SMALL (A) 03/24/2018 1848    Sepsis Labs: Lactic Acid, Venous    Component Value Date/Time   LATICACIDVEN 1.8 09/09/2015 2211    MICROBIOLOGY: Recent Results (from the past 240 hour(s))  MRSA Next Gen by PCR, Nasal     Status: None   Collection Time: 09/20/23  1:48 AM   Specimen: Nasal Mucosa; Nasal Swab  Result Value Ref Range Status   MRSA by PCR Next Gen NOT DETECTED NOT DETECTED Final    Comment: (NOTE) The GeneXpert MRSA Assay (FDA approved for NASAL specimens only), is one component of a comprehensive MRSA colonization surveillance program. It is not intended to diagnose MRSA infection nor to guide or monitor treatment for MRSA infections. Test performance is not FDA approved in patients less than 53 years old. Performed at [facility], [address], [place], [place] [address]     RADIOLOGY STUDIES/RESULTS: VAS US CAROTID  Result Date: 09/24/2023 Carotid Arterial Duplex Study Patient Name:  Foday Cone.  Date of Exam:   09/24/2023 Medical Rec #: [id number]            Accession #:    [id number] Date of Birth: [birth year]           Patient Gender: M Patient Age:   89 years Exam Location:  [facility] Procedure:      VAS US CAROTID Referring Phys: Scheryl Marten XU --------------------------------------------------------------------------------  Indications:       CVA. Risk Factors:      Hypertension, hyperlipidemia, past history of smoking,                    coronary artery disease, prior CVA. Other Factors:     CHF, PFO. Limitations        Today's exam was limited due to the patient's respiratory                    variation and patient movement. Comparison Study:  Previous exam on 04/15/2014 1-39% bilaterally. Performing Technologist: Ernestene Mention RVT, RDMS  Examination Guidelines: A complete evaluation includes B-mode imaging, spectral Doppler, color Doppler, and power Doppler as needed of all accessible portions of each vessel. Bilateral testing is considered an integral part of a complete examination. Limited examinations for reoccurring indications may be performed as noted.  Right Carotid Findings: +----------+--------+--------+--------+------------------+------------------+           PSV cm/sEDV cm/sStenosisPlaque DescriptionComments           +----------+--------+--------+--------+------------------+------------------+ CCA Prox  61      12                                intimal thickening +----------+--------+--------+--------+------------------+------------------+ CCA Distal47      12                                intimal thickening +----------+--------+--------+--------+------------------+------------------+ ICA Prox  Component Value Date/Time   COLORURINE YELLOW 03/24/2018 1848   APPEARANCEUR HAZY (A) 03/24/2018 1848   LABSPEC 1.019 03/24/2018 1848   PHURINE 6.0 03/24/2018 1848   GLUCOSEU NEGATIVE 03/24/2018 1848   GLUCOSEU NEGATIVE 12/26/2015 1659   HGBUR LARGE (A) 03/24/2018 1848   BILIRUBINUR NEGATIVE 03/24/2018 1848   KETONESUR NEGATIVE 03/24/2018 1848   PROTEINUR 100 (A) 03/24/2018 1848   UROBILINOGEN 0.2 12/26/2015 1659   NITRITE NEGATIVE 03/24/2018 1848   LEUKOCYTESUR SMALL (A) 03/24/2018 1848    Sepsis Labs: Lactic Acid, Venous    Component Value Date/Time   LATICACIDVEN 1.8 09/09/2015 2211    MICROBIOLOGY: Recent Results (from the past 240 hour(s))  MRSA Next Gen by PCR, Nasal     Status: None   Collection Time: 09/20/23  1:48 AM   Specimen: Nasal Mucosa; Nasal Swab  Result Value Ref Range Status   MRSA by PCR Next Gen NOT DETECTED NOT DETECTED Final    Comment: (NOTE) The GeneXpert MRSA Assay (FDA approved for NASAL specimens only), is one component of a comprehensive MRSA colonization surveillance program. It is not intended to diagnose MRSA infection nor to guide or monitor treatment for MRSA infections. Test performance is not FDA approved in patients less than 53 years old. Performed at [facility], [address], [place], [place] [address]     RADIOLOGY STUDIES/RESULTS: VAS US CAROTID  Result Date: 09/24/2023 Carotid Arterial Duplex Study Patient Name:  Foday Cone.  Date of Exam:   09/24/2023 Medical Rec #: [id number]            Accession #:    [id number] Date of Birth: [birth year]           Patient Gender: M Patient Age:   89 years Exam Location:  [facility] Procedure:      VAS US CAROTID Referring Phys: Scheryl Marten XU --------------------------------------------------------------------------------  Indications:       CVA. Risk Factors:      Hypertension, hyperlipidemia, past history of smoking,                    coronary artery disease, prior CVA. Other Factors:     CHF, PFO. Limitations        Today's exam was limited due to the patient's respiratory                    variation and patient movement. Comparison Study:  Previous exam on 04/15/2014 1-39% bilaterally. Performing Technologist: Ernestene Mention RVT, RDMS  Examination Guidelines: A complete evaluation includes B-mode imaging, spectral Doppler, color Doppler, and power Doppler as needed of all accessible portions of each vessel. Bilateral testing is considered an integral part of a complete examination. Limited examinations for reoccurring indications may be performed as noted.  Right Carotid Findings: +----------+--------+--------+--------+------------------+------------------+           PSV cm/sEDV cm/sStenosisPlaque DescriptionComments           +----------+--------+--------+--------+------------------+------------------+ CCA Prox  61      12                                intimal thickening +----------+--------+--------+--------+------------------+------------------+ CCA Distal47      12                                intimal thickening +----------+--------+--------+--------+------------------+------------------+ ICA Prox

## 2023-09-26 DIAGNOSIS — R1312 Dysphagia, oropharyngeal phase: Secondary | ICD-10-CM | POA: Diagnosis not present

## 2023-09-26 DIAGNOSIS — N182 Chronic kidney disease, stage 2 (mild): Secondary | ICD-10-CM | POA: Diagnosis not present

## 2023-09-26 DIAGNOSIS — I10 Essential (primary) hypertension: Secondary | ICD-10-CM | POA: Diagnosis not present

## 2023-09-26 DIAGNOSIS — I614 Nontraumatic intracerebral hemorrhage in cerebellum: Secondary | ICD-10-CM | POA: Diagnosis not present

## 2023-09-26 LAB — GLUCOSE, CAPILLARY: Glucose-Capillary: 113 mg/dL — ABNORMAL HIGH (ref 70–99)

## 2023-09-26 MED ORDER — PANTOPRAZOLE SODIUM 40 MG PO TBEC: 40.0000 mg | DELAYED_RELEASE_TABLET | Freq: Every day | ORAL | Status: AC

## 2023-09-26 NOTE — Progress Notes (Signed)
Physical Therapy Treatment Patient Details Name: Tony Davis. MRN: [id number] DOB: 06-28-1961 Today's Date: 09/26/2023   History of Present Illness Pt is 62 year old presented to [facility] on  09/19/23 for weakness and lt gaze preference.Pt with acute intraparenchymal hemorrhage centered at the rt cerebellum. PMH - lt basal ganglia hemorrhage, hydrocephalus s/p shunt (2015), OSA, rheumatoid arthritis, vascular dementia, CAD    PT Comments  Pt received in supine, agreeable to therapy session with encouragement, with good participation in seated/standing activity with RW. Pt c/o dizziness while standing at [facility] for pre-gait exercises, needing minA for lateral steps along EOB and transfers EOB<>RW. BP stable sitting EOB after complaint. Pt reports moderate to severe fatigue 8/10 modified RPE at end of session. Pt continues to benefit from PT services to progress toward functional mobility goals.     If plan is discharge home, recommend the following: A lot of help with walking and/or transfers;A lot of help with bathing/dressing/bathroom;Direct supervision/assist for medications management;Direct supervision/assist for financial management;Assist for transportation   Can travel by private vehicle        Equipment Recommendations  None recommended by PT    Recommendations for Other Services       Precautions / Restrictions Precautions Precautions: Fall;Other (comment) Precaution Comments: SBP <160 per neuro Restrictions Weight Bearing Restrictions: No     Mobility  Bed Mobility Overal bed mobility: Needs Assistance Bed Mobility: Supine to Sit, Sit to Supine     Supine to sit: Min assist, HOB elevated, Used rails Sit to supine: HOB elevated, Used rails, Min assist   General bed mobility comments: Verbal cues and intermittent tactile cues, pt using bed rail to assist. Increased time/effort.    Transfers Overall transfer level: Needs assistance Equipment used: Rolling walker (2  wheels) Transfers: Sit to/from Stand, Bed to chair/wheelchair/BSC Sit to Stand: Min assist   Step pivot transfers: Min assist       General transfer comment: from EOB<>RW, increased time/effort to stand, cues each rep for safe UE placement; x3 trials; sidesteps along EOB toward HOB to simulate step pivot; defer OOB to chair as [facility] arriving soon to transport him for DC.    Ambulation/Gait Ambulation/Gait assistance: Mod assist Gait Distance (Feet): 4 Feet Assistive device: Rolling walker (2 wheels) Gait Pattern/deviations: Step-to pattern, Decreased stride length, Narrow base of support, Shuffle     Pre-gait activities: see Exercise section General Gait Details: pt defers due to BLE fatigue after standing exercises, also c/o nausea/dizziness, BP stable seated.   Stairs             Wheelchair Mobility     Tilt Bed    Modified Rankin (Stroke Patients Only) Modified Rankin (Stroke Patients Only) Pre-Morbid Rankin Score: Moderate disability Modified Rankin: Moderately severe disability     Balance Overall balance assessment: Needs assistance Sitting-balance support: Bilateral upper extremity supported, Feet supported Sitting balance-Leahy Scale: Fair Sitting balance - Comments: CGA for static sitting   Standing balance support: Single extremity supported Standing balance-Leahy Scale: Poor Standing balance comment: reliant on bil UE support of RW                            Cognition Arousal: Alert Behavior During Therapy: Flat affect Overall Cognitive Status: No family/caregiver present to determine baseline cognitive functioning Area of Impairment: Attention, Memory, Following commands, Safety/judgement, Awareness, Problem solving, Orientation  Orientation Level: Disoriented to, Time, Situation, Place Current Attention Level: Sustained Memory: Decreased short-term memory Following Commands: Follows one step commands with  increased time Safety/Judgement: Decreased awareness of safety, Decreased awareness of deficits Awareness: Intellectual Problem Solving: Slow processing, Requires verbal cues, Requires tactile cues, Difficulty sequencing          Exercises Other Exercises Other Exercises: STS x 3 reps (pt defers more due to fatigue/dizziness) Other Exercises: standing BLE AROM: hip flexion x10 reps ea (BUE support) x2 trials    General Comments General comments (skin integrity, edema, etc.): BP 131/84 (98) sitting EOB after standing, pt c/o mild dizziness; HR 65-74 bpm t/o; Initially pt has watery eyes, pt given damp wash cloth with no-rinse soap to wipe his face and reports feeling better after this      Pertinent Vitals/Pain Pain Assessment Pain Assessment: Faces Faces Pain Scale: Hurts a little bit Pain Location: generalized Pain Descriptors / Indicators: Sore Pain Intervention(s): Limited activity within patient's tolerance, Monitored during session, Repositioned    Home Living                          Prior Function            PT Goals (current goals can now be found in the care plan section) Acute Rehab PT Goals Patient Stated Goal: Less pain in my legs PT Goal Formulation: Patient unable to participate in goal setting Time For Goal Achievement: 10/05/23 Progress towards PT goals: Progressing toward goals    Frequency    Min 1X/week      PT Plan      Co-evaluation              AM-PAC PT "6 Clicks" Mobility   Outcome Measure  Help needed turning from your back to your side while in a flat bed without using bedrails?: A Lot Help needed moving from lying on your back to sitting on the side of a flat bed without using bedrails?: A Lot Help needed moving to and from a bed to a chair (including a wheelchair)?: A Lot Help needed standing up from a chair using your arms (e.g., wheelchair or bedside chair)?: A Lot Help needed to walk in hospital room?:  Total Help needed climbing 3-5 steps with a railing? : Total 6 Click Score: 10    End of Session Equipment Utilized During Treatment: Gait belt Activity Tolerance: Patient tolerated treatment well;Other (comment) (c/o mild dizziness) Patient left: with call bell/phone within reach;in bed;with bed alarm set;Other (comment) (pt bed in chair posture) Nurse Communication: Mobility status PT Visit Diagnosis: Unsteadiness on feet (R26.81);Other abnormalities of gait and mobility (R26.89);Other symptoms and signs involving the nervous system (R29.898)     Time: 0981-1914 PT Time Calculation (min) (ACUTE ONLY): 24 min  Charges:    $Therapeutic Exercise: 8-22 mins $Therapeutic Activity: 8-22 mins PT General Charges $$ ACUTE PT VISIT: 1 Visit                     Everlynn Sagun P., PTA Acute Rehabilitation Services Secure Chat Preferred 9a-5:30pm Office: [phone]    [facility] 09/26/2023, 3:49 PM

## 2023-09-26 NOTE — Plan of Care (Signed)
  Problem: Education: Goal: Knowledge of disease or condition will improve Outcome: Progressing   Problem: Self-Care: Goal: Ability to communicate needs accurately will improve Outcome: Progressing   Problem: Clinical Measurements: Goal: Diagnostic test results will improve Outcome: Progressing   Problem: Activity: Goal: Risk for activity intolerance will decrease Outcome: Progressing

## 2023-09-26 NOTE — Discharge Summary (Signed)
[place] Stroke Program Early CT Score) Acute ICH, does not apply. IMPRESSION: 1. Acute intraparenchymal hemorrhage centered at the right cerebellum measuring 1.9 x 1.9 x 1.6 cm (estimated volume 3 mL). Intraventricular extension with small volume blood within the adjacent fourth ventricle. No hydrocephalus. 2. Right frontal approach shunt catheter in place with tip in the right lateral ventricle. Stable ventricular size and morphology. 3. Underlying atrophy with chronic small vessel ischemic disease. These results were communicated to Dr. Iver Nestle at 10:48 pm on 09/19/2023 by text page via the [facility] messaging system. Electronically Signed   By: Rise Mu M.D.   On: 09/19/2023 22:52      TODAY-DAY OF DISCHARGE:  Subjective:   Tony Davis today has no headache,no chest abdominal pain,no new weakness tingling or numbness, feels much better wants to go home today.   Objective:   Blood pressure 138/88, pulse 64, temperature 98 F (36.7 C), temperature source Oral, resp. rate 15, height 6' (1.829 m), weight 106.5 kg, SpO2 94%.  Intake/Output Summary (Last 24 hours) at 09/26/2023 0935 Last data filed at 09/26/2023 0500 Gross per 24 hour  Intake --  Output 700 ml  Net -700 ml   Filed Weights   09/22/23 0500 09/23/23 0500 09/24/23 0347  Weight: 106.9 kg 105.9 kg 106.5 kg    Exam: Awake Alert, Oriented *3, No new F.N deficits, Normal affect [place].AT,PERRAL Supple Neck,No JVD, No cervical lymphadenopathy appriciated.  Symmetrical Chest wall movement, Good air movement bilaterally, CTAB RRR,No Gallops,Rubs or new Murmurs, No Parasternal Heave +ve B.Sounds, Abd Soft, Non tender, No organomegaly appriciated, No rebound -guarding or rigidity. No Cyanosis, Clubbing or edema, No new Rash or bruise   PERTINENT RADIOLOGIC STUDIES: VAS US CAROTID  Result Date: 09/24/2023 Carotid Arterial Duplex Study Patient Name:  Tony Davis.  Date of Exam:   09/24/2023 Medical Rec #: [id number]            Accession #:    [id number] Date of Birth: 02/28/1961           Patient Gender: M Patient Age:   77 years Exam Location:  [facility] Procedure:      VAS US CAROTID Referring Phys: Scheryl Marten XU --------------------------------------------------------------------------------  Indications:       CVA. Risk Factors:      Hypertension, hyperlipidemia, past history of smoking,                    coronary artery disease, prior CVA. Other Factors:     CHF, PFO. Limitations        Today's exam was limited due to the patient's respiratory                    variation and patient movement. Comparison Study:  Previous exam on 04/15/2014 1-39% bilaterally. Performing Technologist: Ernestene Mention  RVT, RDMS  Examination Guidelines: A complete evaluation includes B-mode imaging, spectral Doppler, color Doppler, and power Doppler as needed of all accessible portions of each vessel. Bilateral testing is considered an integral part of a complete examination. Limited examinations for reoccurring indications may be performed as noted.  Right Carotid Findings: +----------+--------+--------+--------+------------------+------------------+           PSV cm/sEDV cm/sStenosisPlaque DescriptionComments           +----------+--------+--------+--------+------------------+------------------+ CCA Prox  61      12  PATIENT DETAILS Name: Tony Davis. Age: 62 y.o. Sex: male Date of Birth: Apr 02, 1961 MRN: [id number]. Admitting Physician: Gordy Councilman, MD PCP:[facility], [facility]  Admit Date: 09/19/2023 Discharge date: 09/26/2023  Recommendations for Outpatient Follow-up:  Follow up with PCP in 1-2 weeks Please obtain CMP/CBC in one week Please ensure follow-up with neurology  Admitted From:  Home  Disposition: Skilled nursing facility   Discharge Condition: good  CODE STATUS:   Code Status: Do not attempt resuscitation (DNR) PRE-ARREST INTERVENTIONS DESIRED   Diet recommendation:  Diet Order             Diet - low sodium heart healthy           Diet regular Room service appropriate? Yes with Assist; Fluid consistency: Thin  Diet effective now                    Brief Summary: Patient is a 62 y.o.  male with prior history of left basal ganglia hemorrhage complicated by hydrocephalus-s/p VP shunt placement 2015-presented with vomiting/left gaze preference-patient was found to have a right cerebellar hemorrhage-admitted to the ICU.  He was stabilized-and transferred to [facility] service on 10/15.   Significant events: 10/10>> admission by neurology to ICU 10/15>> transferred to [facility]   Significant studies: 10/10>> CT head: Acute intraparenchymal hemorrhage right cerebellum 1.9 x 1.9 x 1.6 cm. 10/11>> CT head: Small acute hemorrhage right cerebellar-increased by 1-2 mL 10/12>> CT head: Unchanged intraparenchymal hematoma right cerebellum. 10/12>> A1c: 5.8 10/12>> LDL: 29 10/14>> MRI brain: Unchanged 2.5 cm hematoma in the cerebellum. 10/14>> MRI brain: No major intracranial arterial occlusion.   Significant microbiology data: None   Procedures: None   Consults: Neurology Neurosurgery Palliative care  Brief Hospital Course: Right cerebellar ICH Etiology thought to be due to HTN Numerous imaging studies over the past several days  showing stability in ICH CIR was contemplated but insurance has suggested SNF Neurology follow-up as an outpatient Followed closely by PT/OT.    Oropharyngeal dysphagia Secondary to above SLP following-seems to be tolerating regular diet NG tube discontinued 10/15.   History of CAD-s/p PCI No anginal symptoms Not a candidate for antiplatelets Continue statin/beta-blocker   History of VTE-IVC filter in place Not a anticoagulation candidate   HTN Better controlled Continue metoprolol/hydralazine/amlodipine.   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   Weight as of this encounter: 106.5 kg.    Discharge Diagnoses:  Principal Problem:   Cerebellar hemorrhage [facility])   Discharge Instructions:  Activity:  As tolerated with Full fall precautions use walker/cane & assistance as needed  Discharge Instructions     Ambulatory referral to Neurology   Complete by: As directed    Follow up with Dr. Marjory Lies at [facility] in 4-6 weeks. Pt is Dr. Richrd Humbles pt. Thanks.   Call MD for:  extreme fatigue   Complete by: As directed    Call  PATIENT DETAILS Name: Tony Davis. Age: 62 y.o. Sex: male Date of Birth: Apr 02, 1961 MRN: [id number]. Admitting Physician: Gordy Councilman, MD PCP:[facility], [facility]  Admit Date: 09/19/2023 Discharge date: 09/26/2023  Recommendations for Outpatient Follow-up:  Follow up with PCP in 1-2 weeks Please obtain CMP/CBC in one week Please ensure follow-up with neurology  Admitted From:  Home  Disposition: Skilled nursing facility   Discharge Condition: good  CODE STATUS:   Code Status: Do not attempt resuscitation (DNR) PRE-ARREST INTERVENTIONS DESIRED   Diet recommendation:  Diet Order             Diet - low sodium heart healthy           Diet regular Room service appropriate? Yes with Assist; Fluid consistency: Thin  Diet effective now                    Brief Summary: Patient is a 62 y.o.  male with prior history of left basal ganglia hemorrhage complicated by hydrocephalus-s/p VP shunt placement 2015-presented with vomiting/left gaze preference-patient was found to have a right cerebellar hemorrhage-admitted to the ICU.  He was stabilized-and transferred to [facility] service on 10/15.   Significant events: 10/10>> admission by neurology to ICU 10/15>> transferred to [facility]   Significant studies: 10/10>> CT head: Acute intraparenchymal hemorrhage right cerebellum 1.9 x 1.9 x 1.6 cm. 10/11>> CT head: Small acute hemorrhage right cerebellar-increased by 1-2 mL 10/12>> CT head: Unchanged intraparenchymal hematoma right cerebellum. 10/12>> A1c: 5.8 10/12>> LDL: 29 10/14>> MRI brain: Unchanged 2.5 cm hematoma in the cerebellum. 10/14>> MRI brain: No major intracranial arterial occlusion.   Significant microbiology data: None   Procedures: None   Consults: Neurology Neurosurgery Palliative care  Brief Hospital Course: Right cerebellar ICH Etiology thought to be due to HTN Numerous imaging studies over the past several days  showing stability in ICH CIR was contemplated but insurance has suggested SNF Neurology follow-up as an outpatient Followed closely by PT/OT.    Oropharyngeal dysphagia Secondary to above SLP following-seems to be tolerating regular diet NG tube discontinued 10/15.   History of CAD-s/p PCI No anginal symptoms Not a candidate for antiplatelets Continue statin/beta-blocker   History of VTE-IVC filter in place Not a anticoagulation candidate   HTN Better controlled Continue metoprolol/hydralazine/amlodipine.   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   Weight as of this encounter: 106.5 kg.    Discharge Diagnoses:  Principal Problem:   Cerebellar hemorrhage [facility])   Discharge Instructions:  Activity:  As tolerated with Full fall precautions use walker/cane & assistance as needed  Discharge Instructions     Ambulatory referral to Neurology   Complete by: As directed    Follow up with Dr. Marjory Lies at [facility] in 4-6 weeks. Pt is Dr. Richrd Humbles pt. Thanks.   Call MD for:  extreme fatigue   Complete by: As directed    Call  APRESOLINE Take 100 mg by mouth 2 (two) times daily.   memantine 10 MG tablet Commonly known as: NAMENDA Take 10 mg by mouth 2 (two) times daily.   metoprolol succinate 100 MG 24 hr tablet Commonly known as: TOPROL-XL Take 1 tablet (100 mg total) by mouth 2 (two) times daily. Take with or immediately following a meal.   pantoprazole 40 MG tablet Commonly known as: PROTONIX Take 1 tablet (40 mg total) by mouth daily. Start taking on: September 27, 2023   polyethylene glycol 17 g packet Commonly known as: MIRALAX / GLYCOLAX Take 17 g by mouth 2 (two) times daily as needed for moderate constipation.   rivastigmine 6 MG capsule Commonly known as: EXELON Take 6 mg by mouth 2 (two) times daily.   traZODone 100 MG  tablet Commonly known as: DESYREL Take 200 mg by mouth at bedtime.   Vitamin D3 25 MCG (1000 UT) Caps Take 1,000 Units by mouth daily.        Follow-up Information     Penumalli, Glenford Bayley, MD. Schedule an appointment as soon as possible for a visit in 1 month(s).   Specialties: Neurology, Radiology Contact information: [address] [place] [address] [phone]         [facility], [facility]. Schedule an appointment as soon as possible for a visit in 1 week(s).   Contact information: [address] [place] [address] [phone]                Allergies  Allergen Reactions   Iodine Anaphylaxis   Norvasc [Amlodipine] Swelling and Other (See Comments)    Foot edema   Aricept [Donepezil] Other (See Comments)    Hallucinations     Other Procedures/Studies: VAS US CAROTID  Result Date: 09/24/2023 Carotid Arterial Duplex Study Patient Name:  Tony Davis.  Date of Exam:   09/24/2023 Medical Rec #: [id number]            Accession #:    [id number] Date of Birth: 23-Apr-1961           Patient Gender: M Patient Age:   23 years Exam Location:  [facility] Procedure:      VAS US CAROTID Referring Phys: Scheryl Marten XU --------------------------------------------------------------------------------  Indications:       CVA. Risk Factors:      Hypertension, hyperlipidemia, past history of smoking,                    coronary artery disease, prior CVA. Other Factors:     CHF, PFO. Limitations        Today's exam was limited due to the patient's respiratory                    variation and patient movement. Comparison Study:  Previous exam on 04/15/2014 1-39% bilaterally. Performing Technologist: Ernestene Mention RVT, RDMS  Examination Guidelines: A complete evaluation includes B-mode imaging, spectral Doppler, color Doppler, and power Doppler as needed of all accessible portions of each vessel. Bilateral testing is considered an integral  part of a complete examination. Limited examinations for reoccurring indications may be performed as noted.  Right Carotid Findings: +----------+--------+--------+--------+------------------+------------------+           PSV cm/sEDV cm/sStenosisPlaque DescriptionComments           +----------+--------+--------+--------+------------------+------------------+ CCA Prox  61      12  APRESOLINE Take 100 mg by mouth 2 (two) times daily.   memantine 10 MG tablet Commonly known as: NAMENDA Take 10 mg by mouth 2 (two) times daily.   metoprolol succinate 100 MG 24 hr tablet Commonly known as: TOPROL-XL Take 1 tablet (100 mg total) by mouth 2 (two) times daily. Take with or immediately following a meal.   pantoprazole 40 MG tablet Commonly known as: PROTONIX Take 1 tablet (40 mg total) by mouth daily. Start taking on: September 27, 2023   polyethylene glycol 17 g packet Commonly known as: MIRALAX / GLYCOLAX Take 17 g by mouth 2 (two) times daily as needed for moderate constipation.   rivastigmine 6 MG capsule Commonly known as: EXELON Take 6 mg by mouth 2 (two) times daily.   traZODone 100 MG  tablet Commonly known as: DESYREL Take 200 mg by mouth at bedtime.   Vitamin D3 25 MCG (1000 UT) Caps Take 1,000 Units by mouth daily.        Follow-up Information     Penumalli, Glenford Bayley, MD. Schedule an appointment as soon as possible for a visit in 1 month(s).   Specialties: Neurology, Radiology Contact information: [address] [place] [address] [phone]         [facility], [facility]. Schedule an appointment as soon as possible for a visit in 1 week(s).   Contact information: [address] [place] [address] [phone]                Allergies  Allergen Reactions   Iodine Anaphylaxis   Norvasc [Amlodipine] Swelling and Other (See Comments)    Foot edema   Aricept [Donepezil] Other (See Comments)    Hallucinations     Other Procedures/Studies: VAS US CAROTID  Result Date: 09/24/2023 Carotid Arterial Duplex Study Patient Name:  Tony Davis.  Date of Exam:   09/24/2023 Medical Rec #: [id number]            Accession #:    [id number] Date of Birth: 23-Apr-1961           Patient Gender: M Patient Age:   23 years Exam Location:  [facility] Procedure:      VAS US CAROTID Referring Phys: Scheryl Marten XU --------------------------------------------------------------------------------  Indications:       CVA. Risk Factors:      Hypertension, hyperlipidemia, past history of smoking,                    coronary artery disease, prior CVA. Other Factors:     CHF, PFO. Limitations        Today's exam was limited due to the patient's respiratory                    variation and patient movement. Comparison Study:  Previous exam on 04/15/2014 1-39% bilaterally. Performing Technologist: Ernestene Mention RVT, RDMS  Examination Guidelines: A complete evaluation includes B-mode imaging, spectral Doppler, color Doppler, and power Doppler as needed of all accessible portions of each vessel. Bilateral testing is considered an integral  part of a complete examination. Limited examinations for reoccurring indications may be performed as noted.  Right Carotid Findings: +----------+--------+--------+--------+------------------+------------------+           PSV cm/sEDV cm/sStenosisPlaque DescriptionComments           +----------+--------+--------+--------+------------------+------------------+ CCA Prox  61      12  APRESOLINE Take 100 mg by mouth 2 (two) times daily.   memantine 10 MG tablet Commonly known as: NAMENDA Take 10 mg by mouth 2 (two) times daily.   metoprolol succinate 100 MG 24 hr tablet Commonly known as: TOPROL-XL Take 1 tablet (100 mg total) by mouth 2 (two) times daily. Take with or immediately following a meal.   pantoprazole 40 MG tablet Commonly known as: PROTONIX Take 1 tablet (40 mg total) by mouth daily. Start taking on: September 27, 2023   polyethylene glycol 17 g packet Commonly known as: MIRALAX / GLYCOLAX Take 17 g by mouth 2 (two) times daily as needed for moderate constipation.   rivastigmine 6 MG capsule Commonly known as: EXELON Take 6 mg by mouth 2 (two) times daily.   traZODone 100 MG  tablet Commonly known as: DESYREL Take 200 mg by mouth at bedtime.   Vitamin D3 25 MCG (1000 UT) Caps Take 1,000 Units by mouth daily.        Follow-up Information     Penumalli, Glenford Bayley, MD. Schedule an appointment as soon as possible for a visit in 1 month(s).   Specialties: Neurology, Radiology Contact information: [address] [place] [address] [phone]         Inc, [facility]. Schedule an appointment as soon as possible for a visit in 1 week(s).   Contact information: [address] [place] [address] [phone]                Allergies  Allergen Reactions   Iodine Anaphylaxis   Norvasc [Amlodipine] Swelling and Other (See Comments)    Foot edema   Aricept [Donepezil] Other (See Comments)    Hallucinations     Other Procedures/Studies: VAS US CAROTID  Result Date: 09/24/2023 Carotid Arterial Duplex Study Patient Name:  Tony Davis.  Date of Exam:   09/24/2023 Medical Rec #: [id number]            Accession #:    [id number] Date of Birth: 23-Apr-1961           Patient Gender: M Patient Age:   23 years Exam Location:  [facility] Procedure:      VAS US CAROTID Referring Phys: Scheryl Marten XU --------------------------------------------------------------------------------  Indications:       CVA. Risk Factors:      Hypertension, hyperlipidemia, past history of smoking,                    coronary artery disease, prior CVA. Other Factors:     CHF, PFO. Limitations        Today's exam was limited due to the patient's respiratory                    variation and patient movement. Comparison Study:  Previous exam on 04/15/2014 1-39% bilaterally. Performing Technologist: Ernestene Mention RVT, RDMS  Examination Guidelines: A complete evaluation includes B-mode imaging, spectral Doppler, color Doppler, and power Doppler as needed of all accessible portions of each vessel. Bilateral testing is considered an integral  part of a complete examination. Limited examinations for reoccurring indications may be performed as noted.  Right Carotid Findings: +----------+--------+--------+--------+------------------+------------------+           PSV cm/sEDV cm/sStenosisPlaque DescriptionComments           +----------+--------+--------+--------+------------------+------------------+ CCA Prox  61      12  PATIENT DETAILS Name: Tony Davis. Age: 62 y.o. Sex: male Date of Birth: Apr 02, 1961 MRN: [id number]. Admitting Physician: Gordy Councilman, MD PCP:[facility], [facility]  Admit Date: 09/19/2023 Discharge date: 09/26/2023  Recommendations for Outpatient Follow-up:  Follow up with PCP in 1-2 weeks Please obtain CMP/CBC in one week Please ensure follow-up with neurology  Admitted From:  Home  Disposition: Skilled nursing facility   Discharge Condition: good  CODE STATUS:   Code Status: Do not attempt resuscitation (DNR) PRE-ARREST INTERVENTIONS DESIRED   Diet recommendation:  Diet Order             Diet - low sodium heart healthy           Diet regular Room service appropriate? Yes with Assist; Fluid consistency: Thin  Diet effective now                    Brief Summary: Patient is a 62 y.o.  male with prior history of left basal ganglia hemorrhage complicated by hydrocephalus-s/p VP shunt placement 2015-presented with vomiting/left gaze preference-patient was found to have a right cerebellar hemorrhage-admitted to the ICU.  He was stabilized-and transferred to [facility] service on 10/15.   Significant events: 10/10>> admission by neurology to ICU 10/15>> transferred to [facility]   Significant studies: 10/10>> CT head: Acute intraparenchymal hemorrhage right cerebellum 1.9 x 1.9 x 1.6 cm. 10/11>> CT head: Small acute hemorrhage right cerebellar-increased by 1-2 mL 10/12>> CT head: Unchanged intraparenchymal hematoma right cerebellum. 10/12>> A1c: 5.8 10/12>> LDL: 29 10/14>> MRI brain: Unchanged 2.5 cm hematoma in the cerebellum. 10/14>> MRI brain: No major intracranial arterial occlusion.   Significant microbiology data: None   Procedures: None   Consults: Neurology Neurosurgery Palliative care  Brief Hospital Course: Right cerebellar ICH Etiology thought to be due to HTN Numerous imaging studies over the past several days  showing stability in ICH CIR was contemplated but insurance has suggested SNF Neurology follow-up as an outpatient Followed closely by PT/OT.    Oropharyngeal dysphagia Secondary to above SLP following-seems to be tolerating regular diet NG tube discontinued 10/15.   History of CAD-s/p PCI No anginal symptoms Not a candidate for antiplatelets Continue statin/beta-blocker   History of VTE-IVC filter in place Not a anticoagulation candidate   HTN Better controlled Continue metoprolol/hydralazine/amlodipine.   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   Weight as of this encounter: 106.5 kg.    Discharge Diagnoses:  Principal Problem:   Cerebellar hemorrhage [facility])   Discharge Instructions:  Activity:  As tolerated with Full fall precautions use walker/cane & assistance as needed  Discharge Instructions     Ambulatory referral to Neurology   Complete by: As directed    Follow up with Dr. Marjory Lies at [facility] in 4-6 weeks. Pt is Dr. Richrd Humbles pt. Thanks.   Call MD for:  extreme fatigue   Complete by: As directed    Call  APRESOLINE Take 100 mg by mouth 2 (two) times daily.   memantine 10 MG tablet Commonly known as: NAMENDA Take 10 mg by mouth 2 (two) times daily.   metoprolol succinate 100 MG 24 hr tablet Commonly known as: TOPROL-XL Take 1 tablet (100 mg total) by mouth 2 (two) times daily. Take with or immediately following a meal.   pantoprazole 40 MG tablet Commonly known as: PROTONIX Take 1 tablet (40 mg total) by mouth daily. Start taking on: September 27, 2023   polyethylene glycol 17 g packet Commonly known as: MIRALAX / GLYCOLAX Take 17 g by mouth 2 (two) times daily as needed for moderate constipation.   rivastigmine 6 MG capsule Commonly known as: EXELON Take 6 mg by mouth 2 (two) times daily.   traZODone 100 MG  tablet Commonly known as: DESYREL Take 200 mg by mouth at bedtime.   Vitamin D3 25 MCG (1000 UT) Caps Take 1,000 Units by mouth daily.        Follow-up Information     Penumalli, Glenford Bayley, MD. Schedule an appointment as soon as possible for a visit in 1 month(s).   Specialties: Neurology, Radiology Contact information: [address] [place] [address] [phone]         [facility], [facility]. Schedule an appointment as soon as possible for a visit in 1 week(s).   Contact information: [address] [place] [address] [phone]                Allergies  Allergen Reactions   Iodine Anaphylaxis   Norvasc [Amlodipine] Swelling and Other (See Comments)    Foot edema   Aricept [Donepezil] Other (See Comments)    Hallucinations     Other Procedures/Studies: VAS US CAROTID  Result Date: 09/24/2023 Carotid Arterial Duplex Study Patient Name:  Tony Davis.  Date of Exam:   09/24/2023 Medical Rec #: [id number]            Accession #:    [id number] Date of Birth: 23-Apr-1961           Patient Gender: M Patient Age:   23 years Exam Location:  [facility] Procedure:      VAS US CAROTID Referring Phys: Scheryl Marten XU --------------------------------------------------------------------------------  Indications:       CVA. Risk Factors:      Hypertension, hyperlipidemia, past history of smoking,                    coronary artery disease, prior CVA. Other Factors:     CHF, PFO. Limitations        Today's exam was limited due to the patient's respiratory                    variation and patient movement. Comparison Study:  Previous exam on 04/15/2014 1-39% bilaterally. Performing Technologist: Ernestene Mention RVT, RDMS  Examination Guidelines: A complete evaluation includes B-mode imaging, spectral Doppler, color Doppler, and power Doppler as needed of all accessible portions of each vessel. Bilateral testing is considered an integral  part of a complete examination. Limited examinations for reoccurring indications may be performed as noted.  Right Carotid Findings: +----------+--------+--------+--------+------------------+------------------+           PSV cm/sEDV cm/sStenosisPlaque DescriptionComments           +----------+--------+--------+--------+------------------+------------------+ CCA Prox  61      12  APRESOLINE Take 100 mg by mouth 2 (two) times daily.   memantine 10 MG tablet Commonly known as: NAMENDA Take 10 mg by mouth 2 (two) times daily.   metoprolol succinate 100 MG 24 hr tablet Commonly known as: TOPROL-XL Take 1 tablet (100 mg total) by mouth 2 (two) times daily. Take with or immediately following a meal.   pantoprazole 40 MG tablet Commonly known as: PROTONIX Take 1 tablet (40 mg total) by mouth daily. Start taking on: September 27, 2023   polyethylene glycol 17 g packet Commonly known as: MIRALAX / GLYCOLAX Take 17 g by mouth 2 (two) times daily as needed for moderate constipation.   rivastigmine 6 MG capsule Commonly known as: EXELON Take 6 mg by mouth 2 (two) times daily.   traZODone 100 MG  tablet Commonly known as: DESYREL Take 200 mg by mouth at bedtime.   Vitamin D3 25 MCG (1000 UT) Caps Take 1,000 Units by mouth daily.        Follow-up Information     Penumalli, Glenford Bayley, MD. Schedule an appointment as soon as possible for a visit in 1 month(s).   Specialties: Neurology, Radiology Contact information: [address] [place] [address] [phone]         Inc, [address] [facility] [place]. Schedule an appointment as soon as possible for a visit in 1 week(s).   Contact information: [address] [place] [address] [phone]                Allergies  Allergen Reactions   Iodine Anaphylaxis   Norvasc [Amlodipine] Swelling and Other (See Comments)    Foot edema   Aricept [Donepezil] Other (See Comments)    Hallucinations     Other Procedures/Studies: VAS US CAROTID  Result Date: 09/24/2023 Carotid Arterial Duplex Study Patient Name:  Tony Davis.  Date of Exam:   09/24/2023 Medical Rec #: [id number]            Accession #:    [id number] Date of Birth: 23-Apr-1961           Patient Gender: M Patient Age:   23 years Exam Location:  [facility] Procedure:      VAS US CAROTID Referring Phys: Scheryl Marten XU --------------------------------------------------------------------------------  Indications:       CVA. Risk Factors:      Hypertension, hyperlipidemia, past history of smoking,                    coronary artery disease, prior CVA. Other Factors:     CHF, PFO. Limitations        Today's exam was limited due to the patient's respiratory                    variation and patient movement. Comparison Study:  Previous exam on 04/15/2014 1-39% bilaterally. Performing Technologist: Ernestene Mention RVT, RDMS  Examination Guidelines: A complete evaluation includes B-mode imaging, spectral Doppler, color Doppler, and power Doppler as needed of all accessible portions of each vessel. Bilateral testing is considered an integral  part of a complete examination. Limited examinations for reoccurring indications may be performed as noted.  Right Carotid Findings: +----------+--------+--------+--------+------------------+------------------+           PSV cm/sEDV cm/sStenosisPlaque DescriptionComments           +----------+--------+--------+--------+------------------+------------------+ CCA Prox  61      12  APRESOLINE Take 100 mg by mouth 2 (two) times daily.   memantine 10 MG tablet Commonly known as: NAMENDA Take 10 mg by mouth 2 (two) times daily.   metoprolol succinate 100 MG 24 hr tablet Commonly known as: TOPROL-XL Take 1 tablet (100 mg total) by mouth 2 (two) times daily. Take with or immediately following a meal.   pantoprazole 40 MG tablet Commonly known as: PROTONIX Take 1 tablet (40 mg total) by mouth daily. Start taking on: September 27, 2023   polyethylene glycol 17 g packet Commonly known as: MIRALAX / GLYCOLAX Take 17 g by mouth 2 (two) times daily as needed for moderate constipation.   rivastigmine 6 MG capsule Commonly known as: EXELON Take 6 mg by mouth 2 (two) times daily.   traZODone 100 MG  tablet Commonly known as: DESYREL Take 200 mg by mouth at bedtime.   Vitamin D3 25 MCG (1000 UT) Caps Take 1,000 Units by mouth daily.        Follow-up Information     Penumalli, Glenford Bayley, MD. Schedule an appointment as soon as possible for a visit in 1 month(s).   Specialties: Neurology, Radiology Contact information: [address] [place] [address] [phone]         Inc, [address] [facility] [place]. Schedule an appointment as soon as possible for a visit in 1 week(s).   Contact information: [address] [place] [address] [phone]                Allergies  Allergen Reactions   Iodine Anaphylaxis   Norvasc [Amlodipine] Swelling and Other (See Comments)    Foot edema   Aricept [Donepezil] Other (See Comments)    Hallucinations     Other Procedures/Studies: VAS US CAROTID  Result Date: 09/24/2023 Carotid Arterial Duplex Study Patient Name:  Tony Davis.  Date of Exam:   09/24/2023 Medical Rec #: [id number]            Accession #:    [id number] Date of Birth: 23-Apr-1961           Patient Gender: M Patient Age:   23 years Exam Location:  [facility] Procedure:      VAS US CAROTID Referring Phys: Scheryl Marten XU --------------------------------------------------------------------------------  Indications:       CVA. Risk Factors:      Hypertension, hyperlipidemia, past history of smoking,                    coronary artery disease, prior CVA. Other Factors:     CHF, PFO. Limitations        Today's exam was limited due to the patient's respiratory                    variation and patient movement. Comparison Study:  Previous exam on 04/15/2014 1-39% bilaterally. Performing Technologist: Ernestene Mention RVT, RDMS  Examination Guidelines: A complete evaluation includes B-mode imaging, spectral Doppler, color Doppler, and power Doppler as needed of all accessible portions of each vessel. Bilateral testing is considered an integral  part of a complete examination. Limited examinations for reoccurring indications may be performed as noted.  Right Carotid Findings: +----------+--------+--------+--------+------------------+------------------+           PSV cm/sEDV cm/sStenosisPlaque DescriptionComments           +----------+--------+--------+--------+------------------+------------------+ CCA Prox  61      12  APRESOLINE Take 100 mg by mouth 2 (two) times daily.   memantine 10 MG tablet Commonly known as: NAMENDA Take 10 mg by mouth 2 (two) times daily.   metoprolol succinate 100 MG 24 hr tablet Commonly known as: TOPROL-XL Take 1 tablet (100 mg total) by mouth 2 (two) times daily. Take with or immediately following a meal.   pantoprazole 40 MG tablet Commonly known as: PROTONIX Take 1 tablet (40 mg total) by mouth daily. Start taking on: September 27, 2023   polyethylene glycol 17 g packet Commonly known as: MIRALAX / GLYCOLAX Take 17 g by mouth 2 (two) times daily as needed for moderate constipation.   rivastigmine 6 MG capsule Commonly known as: EXELON Take 6 mg by mouth 2 (two) times daily.   traZODone 100 MG  tablet Commonly known as: DESYREL Take 200 mg by mouth at bedtime.   Vitamin D3 25 MCG (1000 UT) Caps Take 1,000 Units by mouth daily.        Follow-up Information     Penumalli, Glenford Bayley, MD. Schedule an appointment as soon as possible for a visit in 1 month(s).   Specialties: Neurology, Radiology Contact information: [address] [place] [address] [phone]         Inc, [address] [facility] [place]. Schedule an appointment as soon as possible for a visit in 1 week(s).   Contact information: [address] [place] [address] [phone]                Allergies  Allergen Reactions   Iodine Anaphylaxis   Norvasc [Amlodipine] Swelling and Other (See Comments)    Foot edema   Aricept [Donepezil] Other (See Comments)    Hallucinations     Other Procedures/Studies: VAS US CAROTID  Result Date: 09/24/2023 Carotid Arterial Duplex Study Patient Name:  Tony Davis.  Date of Exam:   09/24/2023 Medical Rec #: [id number]            Accession #:    [id number] Date of Birth: 23-Apr-1961           Patient Gender: M Patient Age:   23 years Exam Location:  [facility] Procedure:      VAS US CAROTID Referring Phys: Scheryl Marten XU --------------------------------------------------------------------------------  Indications:       CVA. Risk Factors:      Hypertension, hyperlipidemia, past history of smoking,                    coronary artery disease, prior CVA. Other Factors:     CHF, PFO. Limitations        Today's exam was limited due to the patient's respiratory                    variation and patient movement. Comparison Study:  Previous exam on 04/15/2014 1-39% bilaterally. Performing Technologist: Ernestene Mention RVT, RDMS  Examination Guidelines: A complete evaluation includes B-mode imaging, spectral Doppler, color Doppler, and power Doppler as needed of all accessible portions of each vessel. Bilateral testing is considered an integral  part of a complete examination. Limited examinations for reoccurring indications may be performed as noted.  Right Carotid Findings: +----------+--------+--------+--------+------------------+------------------+           PSV cm/sEDV cm/sStenosisPlaque DescriptionComments           +----------+--------+--------+--------+------------------+------------------+ CCA Prox  61      12  PATIENT DETAILS Name: Tony Davis. Age: 62 y.o. Sex: male Date of Birth: Apr 02, 1961 MRN: [id number]. Admitting Physician: Gordy Councilman, MD PCP:[facility]  Admit Date: 09/19/2023 Discharge date: 09/26/2023  Recommendations for Outpatient Follow-up:  Follow up with PCP in 1-2 weeks Please obtain CMP/CBC in one week Please ensure follow-up with neurology  Admitted From:  Home  Disposition: Skilled nursing facility   Discharge Condition: good  CODE STATUS:   Code Status: Do not attempt resuscitation (DNR) PRE-ARREST INTERVENTIONS DESIRED   Diet recommendation:  Diet Order             Diet - low sodium heart healthy           Diet regular Room service appropriate? Yes with Assist; Fluid consistency: Thin  Diet effective now                    Brief Summary: Patient is a 62 y.o.  male with prior history of left basal ganglia hemorrhage complicated by hydrocephalus-s/p VP shunt placement 2015-presented with vomiting/left gaze preference-patient was found to have a right cerebellar hemorrhage-admitted to the ICU.  He was stabilized-and transferred to [facility] service on 10/15.   Significant events: 10/10>> admission by neurology to ICU 10/15>> transferred to [facility]   Significant studies: 10/10>> CT head: Acute intraparenchymal hemorrhage right cerebellum 1.9 x 1.9 x 1.6 cm. 10/11>> CT head: Small acute hemorrhage right cerebellar-increased by 1-2 mL 10/12>> CT head: Unchanged intraparenchymal hematoma right cerebellum. 10/12>> A1c: 5.8 10/12>> LDL: 29 10/14>> MRI brain: Unchanged 2.5 cm hematoma in the cerebellum. 10/14>> MRI brain: No major intracranial arterial occlusion.   Significant microbiology data: None   Procedures: None   Consults: Neurology Neurosurgery Palliative care  Brief Hospital Course: Right cerebellar ICH Etiology thought to be due to HTN Numerous imaging studies over the past several days  showing stability in ICH CIR was contemplated but insurance has suggested SNF Neurology follow-up as an outpatient Followed closely by PT/OT.    Oropharyngeal dysphagia Secondary to above SLP following-seems to be tolerating regular diet NG tube discontinued 10/15.   History of CAD-s/p PCI No anginal symptoms Not a candidate for antiplatelets Continue statin/beta-blocker   History of VTE-IVC filter in place Not a anticoagulation candidate   HTN Better controlled Continue metoprolol/hydralazine/amlodipine.   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   Weight as of this encounter: 106.5 kg.    Discharge Diagnoses:  Principal Problem:   Cerebellar hemorrhage [facility])   Discharge Instructions:  Activity:  As tolerated with Full fall precautions use walker/cane & assistance as needed  Discharge Instructions     Ambulatory referral to Neurology   Complete by: As directed    Follow up with Dr. Marjory Lies at [facility] in 4-6 weeks. Pt is Dr. Richrd Humbles pt. Thanks.   Call MD for:  extreme fatigue   Complete by: As directed    Call

## 2023-09-26 NOTE — TOC Progression Note (Signed)
Transition of Care (TOC) - Progression Note    Patient Details  Name: Tony Davis. MRN: [id number] Date of Birth: November 02, 1961  Transition of Care [facility]) CM/SW Contact  Mearl Latin, LCSW Phone Number: 09/26/2023, 10:16 AM  Clinical Narrative:    CSW spoke with [facility] and confirmed they can pick patient up (wheelchair level) after 12pm. CSW relayed info to RN and [place].   Expected Discharge Plan: Skilled Nursing Facility Barriers to Discharge: Barriers Resolved  Expected Discharge Plan and Services In-house Referral: Clinical Social Work Discharge Planning Services: CM Consult Post Acute Care Choice: Skilled Nursing Facility Living arrangements for the past 2 months: Single Family Home Expected Discharge Date: 09/26/23                                     Social Determinants of Health (SDOH) Interventions SDOH Screenings   Food Insecurity: No Food Insecurity (01/19/2023)  Housing: Low Risk  (01/19/2023)  Transportation Needs: No Transportation Needs (01/19/2023)  Utilities: Not At Risk (01/19/2023)  Tobacco Use: Medium Risk (07/29/2023)    Readmission Risk Interventions     No data to display

## 2023-09-27 ENCOUNTER — Other Ambulatory Visit: Payer: Medicare (Managed Care)

## 2023-09-29 NOTE — Plan of Care (Signed)
CHL Tonsillectomy/Adenoidectomy, Postoperative PEDS care plan entered in error.

## 2023-10-18 ENCOUNTER — Inpatient Hospital Stay: Admission: RE | Admit: 2023-10-18 | Payer: Medicare (Managed Care) | Source: Ambulatory Visit

## 2023-11-01 ENCOUNTER — Ambulatory Visit
Admission: RE | Admit: 2023-11-01 | Discharge: 2023-11-01 | Disposition: A | Discharge status: Home / Self Care | Payer: Self-pay | Source: Ambulatory Visit | Attending: Acute Care | Admitting: Acute Care

## 2023-11-01 DIAGNOSIS — Z87891 Personal history of nicotine dependence: Secondary | ICD-10-CM

## 2023-11-01 DIAGNOSIS — Z122 Encounter for screening for malignant neoplasm of respiratory organs: Secondary | ICD-10-CM

## 2023-11-13 ENCOUNTER — Inpatient Hospital Stay: Payer: Medicare Other | Admitting: Diagnostic Neuroimaging

## 2023-11-21 ENCOUNTER — Inpatient Hospital Stay: Payer: Medicare Other | Admitting: Diagnostic Neuroimaging

## 2023-11-25 ENCOUNTER — Other Ambulatory Visit: Payer: Self-pay

## 2023-11-25 DIAGNOSIS — Z87891 Personal history of nicotine dependence: Secondary | ICD-10-CM

## 2023-11-25 DIAGNOSIS — Z122 Encounter for screening for malignant neoplasm of respiratory organs: Secondary | ICD-10-CM

## 2023-12-20 ENCOUNTER — Ambulatory Visit: Payer: Self-pay | Admitting: Cardiovascular Disease

## 2024-02-03 ENCOUNTER — Other Ambulatory Visit: Payer: Self-pay | Admitting: Vascular Surgery

## 2024-02-03 ENCOUNTER — Ambulatory Visit
Admission: RE | Admit: 2024-02-03 | Discharge: 2024-02-03 | Disposition: A | Discharge status: Home / Self Care | Payer: Medicare (Managed Care) | Source: Ambulatory Visit | Attending: Vascular Surgery | Admitting: Vascular Surgery

## 2024-02-03 DIAGNOSIS — M79604 Pain in right leg: Secondary | ICD-10-CM

## 2024-02-04 ENCOUNTER — Other Ambulatory Visit (HOSPITAL_COMMUNITY): Payer: Self-pay | Admitting: Vascular Surgery

## 2024-02-04 DIAGNOSIS — M25551 Pain in right hip: Secondary | ICD-10-CM

## 2024-02-05 ENCOUNTER — Ambulatory Visit (HOSPITAL_COMMUNITY)
Admission: RE | Admit: 2024-02-05 | Discharge: 2024-02-05 | Disposition: A | Discharge status: Home / Self Care | Payer: Medicare (Managed Care) | Source: Ambulatory Visit | Attending: Vascular Surgery | Admitting: Vascular Surgery

## 2024-02-05 ENCOUNTER — Encounter (HOSPITAL_COMMUNITY): Payer: Self-pay | Admitting: Emergency Medicine

## 2024-02-05 ENCOUNTER — Other Ambulatory Visit: Payer: Self-pay

## 2024-02-05 ENCOUNTER — Telehealth: Payer: Self-pay

## 2024-02-05 ENCOUNTER — Emergency Department (HOSPITAL_COMMUNITY)
Admission: EM | Admit: 2024-02-05 | Discharge: 2024-02-05 | Disposition: A | Discharge status: Home / Self Care | Payer: Medicare (Managed Care) | Attending: Emergency Medicine | Admitting: Emergency Medicine

## 2024-02-05 DIAGNOSIS — Z79899 Other long term (current) drug therapy: Secondary | ICD-10-CM | POA: Diagnosis not present

## 2024-02-05 DIAGNOSIS — I2510 Atherosclerotic heart disease of native coronary artery without angina pectoris: Secondary | ICD-10-CM | POA: Insufficient documentation

## 2024-02-05 DIAGNOSIS — S79912A Unspecified injury of left hip, initial encounter: Secondary | ICD-10-CM | POA: Diagnosis present

## 2024-02-05 DIAGNOSIS — Z8673 Personal history of transient ischemic attack (TIA), and cerebral infarction without residual deficits: Secondary | ICD-10-CM | POA: Insufficient documentation

## 2024-02-05 DIAGNOSIS — I10 Essential (primary) hypertension: Secondary | ICD-10-CM | POA: Insufficient documentation

## 2024-02-05 DIAGNOSIS — W010XXA Fall on same level from slipping, tripping and stumbling without subsequent striking against object, initial encounter: Secondary | ICD-10-CM | POA: Insufficient documentation

## 2024-02-05 DIAGNOSIS — S72114A Nondisplaced fracture of greater trochanter of right femur, initial encounter for closed fracture: Secondary | ICD-10-CM | POA: Insufficient documentation

## 2024-02-05 DIAGNOSIS — M25551 Pain in right hip: Secondary | ICD-10-CM

## 2024-02-05 MED ORDER — OXYCODONE-ACETAMINOPHEN 5-325 MG PO TABS
1.0000 | ORAL_TABLET | Freq: Once | ORAL | Status: AC
  Administered 2024-02-05: 1 via ORAL
  Filled 2024-02-05: qty 1

## 2024-02-05 NOTE — ED Triage Notes (Signed)
 Patient presents post fall from Friday and has a confirmed right hip fracture. He has had a CT and xray taken. Patient was sent here from [facility]. He is A&Ox2. EMS note good sensation and pulse. Patient had been walking post fall.    EMS vitals: 118 palpated BP 66 HR 99% SPO2 on room air

## 2024-02-05 NOTE — Telephone Encounter (Signed)
 This looks nonoperative.  I would go nonweightbearing with crutches and follow-up in 1 week thanks

## 2024-02-05 NOTE — Discharge Instructions (Signed)
 You were seen in the emergency department for your hip fracture.  This is a stable type of fracture that you can walk on and you can walk with your walker.  You can take the pain medications as prescribed by your primary doctor in addition to Tylenol and you should follow-up with your orthopedic doctor in the next few days to have your symptoms rechecked and for further management.  You should return to the emergency department for significantly worsening pain, if you are unable to walk or if you have any other new or concerning symptoms.

## 2024-02-05 NOTE — ED Provider Notes (Signed)
 [place] [facility] DEPARTMENT AT [facility] Provider Note   CSN: [id number] Arrival date & time: 02/05/24  1542     History  Chief Complaint  Patient presents with   Fall   Hip Pain    Tony Davis. is a 63 y.o. male.  Patient is a 63 year old male with a past medical history of CVA with left-sided deficits, hypertension, CAD presented to the emergency department after a fall.  Patient states on Friday he slipped and fell and landed on his left hip.  He also reports that he hit his head but denies any loss of consciousness.  He denies any blood thinner use.  He denies any headaches since the fall.  He states he is only when having pain in his right hip.  He states he still been able to ambulate and uses a cane or a walker to get around.  He denies any numbness or weakness.  He was seen by his primary doctor and had x-ray and CT scan that confirmed a greater trochanteric fracture and was recommended to come to the ER.  The history is provided by the patient.  Fall  Hip Pain       Home Medications Prior to Admission medications   Medication Sig Start Date End Date Taking? Authorizing Provider  acetaminophen (TYLENOL) 325 MG tablet Take 650 mg by mouth every 6 (six) hours as needed for moderate pain.    [provider]  atorvastatin (LIPITOR) 80 MG tablet TAKE ONE TABLET BY MOUTH DAILY 08/16/18   Etta Grandchild, MD  Cholecalciferol (VITAMIN D3) 25 MCG (1000 UT) CAPS Take 1,000 Units by mouth daily.    [provider]  escitalopram (LEXAPRO) 20 MG tablet Take 20 mg by mouth daily.    [provider]  famotidine (PEPCID) 20 MG tablet Take 20 mg by mouth daily.    [provider]  hydrALAZINE (APRESOLINE) 100 MG tablet Take 100 mg by mouth 2 (two) times daily.    [provider]  memantine (NAMENDA) 10 MG tablet Take 10 mg by mouth 2 (two) times daily. 10/30/21   [provider]  metoprolol succinate (TOPROL-XL)  100 MG 24 hr tablet Take 1 tablet (100 mg total) by mouth 2 (two) times daily. Take with or immediately following a meal. 12/25/21   Kathleene Hazel, MD  pantoprazole (PROTONIX) 40 MG tablet Take 1 tablet (40 mg total) by mouth daily. 09/27/23   Ghimire, Werner Lean, MD  polyethylene glycol (MIRALAX / GLYCOLAX) 17 g packet Take 17 g by mouth 2 (two) times daily as needed for moderate constipation.    [provider]  rivastigmine (EXELON) 6 MG capsule Take 6 mg by mouth 2 (two) times daily. 11/11/21   [provider]  traZODone (DESYREL) 100 MG tablet Take 200 mg by mouth at bedtime. 11/11/21   [provider]      Allergies    Iodine, Norvasc [amlodipine], and Aricept [donepezil]    Review of Systems   Review of Systems  Physical Exam Updated Vital Signs BP (!) 172/104 (BP Location: Left Arm)   Pulse 68   Temp 98.6 F (37 C) (Oral)   Resp (!) 21   SpO2 99%  Physical Exam Vitals and nursing note reviewed.  Constitutional:      General: He is not in acute distress.    Appearance: Normal appearance.  HENT:     Head: Normocephalic and atraumatic.     Nose:  Nose normal.     Mouth/Throat:     Mouth: Mucous membranes are moist.     Pharynx: Oropharynx is clear.  Eyes:     Extraocular Movements: Extraocular movements intact.     Conjunctiva/sclera: Conjunctivae normal.  Cardiovascular:     Rate and Rhythm: Normal rate and regular rhythm.     Pulses: Normal pulses.  Pulmonary:     Effort: Pulmonary effort is normal.     Breath sounds: Normal breath sounds.  Abdominal:     General: Abdomen is flat.     Palpations: Abdomen is soft.  Musculoskeletal:     Cervical back: Normal range of motion.     Comments: Tenderness to palpation of right lateral hip with increased pain with internal/external rotation, no tenderness down remainder of right lower extremity, knee flexion and plantar/dorsi flexion intact No tenderness to left lower extremity or bilateral  upper extremities  Skin:    General: Skin is warm and dry.  Neurological:     Mental Status: He is alert. Mental status is at baseline.  Psychiatric:        Mood and Affect: Mood normal.        Behavior: Behavior normal.     ED Results / Procedures / Treatments   Labs (all labs ordered are listed, but only abnormal results are displayed) Labs Reviewed - No data to display  EKG None  Radiology CT HIP RIGHT WO CONTRAST Result Date: 02/05/2024 CLINICAL DATA:  Right hip fracture.  Fall EXAM: CT OF THE RIGHT HIP WITHOUT CONTRAST TECHNIQUE: Multidetector CT imaging of the right hip was performed according to the standard protocol. Multiplanar CT image reconstructions were also generated. RADIATION DOSE REDUCTION: This exam was performed according to the departmental dose-optimization program which includes automated exposure control, adjustment of the mA and/or kV according to patient size and/or use of iterative reconstruction technique. COMPARISON:  X-ray 02/03/2024 FINDINGS: Bones/Joint/Cartilage Acute transversely oriented fracture through the superior aspect of the right greater trochanter with minimal displacement. No fracture extension through the intertrochanteric region. No femoral neck fracture. Hip joint alignment is maintained without dislocation. Moderate osteoarthritis of the right hip with joint space narrowing, subchondral sclerosis/cystic change, and marginal osteophyte formation. Included portion of the right hemipelvis is intact. No suspicious bone lesion. Ligaments Suboptimally assessed by CT. Muscles and Tendons No acute musculotendinous abnormality by CT. Soft tissues Soft tissue swelling overlying the lateral aspect of the right hip and proximal thigh. No organized hematoma. Atherosclerotic vascular calcification. IMPRESSION: 1. Acute minimally displaced fracture of the right greater trochanter. 2. Moderate osteoarthritis of the right hip. Electronically Signed   By: Duanne Guess D.O.   On: 02/05/2024 11:07    Procedures Procedures    Medications Ordered in ED Medications  oxyCODONE-acetaminophen (PERCOCET/ROXICET) 5-325 MG per tablet 1 tablet (1 tablet Oral Given 02/05/24 1746)    ED Course/ Medical Decision Making/ A&P Clinical Course as of 02/05/24 1959  Wed Feb 05, 2024  1853 I spoke with orthopedics PA with [facility] who recommended WBAT, pain control and follow up outpatient. [VK]    Clinical Course User Index [VK] Rexford Maus, DO                                 Medical Decision Making This patient presents to the ED with chief complaint(s) of fall, hip fracture with pertinent past medical history of CVA with left-sided deficits, CAD,  hypertension which further complicates the presenting complaint. The complaint involves an extensive differential diagnosis and also carries with it a high risk of complications and morbidity.    The differential diagnosis includes hip fracture, dislocation, no other traumatic injury seen on exam, no presyncopal symptoms making syncopal fall unlikely  Additional history obtained: Additional history obtained from N/A Records reviewed previous admission documents and Primary Care Documents  ED Course and Reassessment: On patient's arrival he is hemodynamically stable in no acute distress.  I did review his outpatient x-ray and CT that confirmed a right greater trochanteric fracture.  He is neurovascularly intact.  Will plan to consult orthopedics to determine if his should need surgical management.  Independent labs interpretation:  N/A  Independent visualization of imaging: - N/A  Consultation: - Consulted or discussed management/test interpretation w/ external professional: ortho  Consideration for admission or further workup: Patient has no emergent conditions requiring admission or further work-up at this time and is stable for discharge home with ortho follow-up  Social Determinants of  health: N/A    Risk Prescription drug management.          Final Clinical Impression(s) / ED Diagnoses Final diagnoses:  Closed nondisplaced fracture of greater trochanter of right femur, initial encounter [facility])    Rx / DC Orders ED Discharge Orders     None         Rexford Maus, DO 02/05/24 1959

## 2024-02-05 NOTE — Telephone Encounter (Signed)
 Tonye Becket NP(PCP) from [facility] called Left VM on Triage phone states he had a fall and would like to know if he needs to go to hospital or come see [place]. Had a CT scan (right hip). NWB.   [phone]

## 2024-02-06 NOTE — Telephone Encounter (Signed)
 Tried calling [facility]. Will follow up with them.

## 2024-02-10 NOTE — Telephone Encounter (Signed)
 Still have not been able to reach [facility] regarding patient and him getting an appointment.

## 2024-02-21 ENCOUNTER — Ambulatory Visit: Payer: Medicare (Managed Care) | Admitting: Orthopedic Surgery

## 2024-02-28 ENCOUNTER — Ambulatory Visit: Payer: Medicare (Managed Care) | Admitting: Orthopedic Surgery

## 2024-03-02 ENCOUNTER — Encounter: Payer: Self-pay | Admitting: Orthopedic Surgery

## 2024-03-02 ENCOUNTER — Ambulatory Visit (INDEPENDENT_AMBULATORY_CARE_PROVIDER_SITE_OTHER): Payer: Medicare (Managed Care) | Admitting: Orthopedic Surgery

## 2024-03-02 ENCOUNTER — Other Ambulatory Visit (INDEPENDENT_AMBULATORY_CARE_PROVIDER_SITE_OTHER): Payer: Self-pay

## 2024-03-02 DIAGNOSIS — Z09 Encounter for follow-up examination after completed treatment for conditions other than malignant neoplasm: Secondary | ICD-10-CM

## 2024-03-02 NOTE — Progress Notes (Signed)
 Office Visit Note   Patient: Tony Davis.           Date of Birth: October 31, 1961           MRN: [id number] Visit Date: 03/02/2024 Requested by: [facility] [address],  [place] [address] PCP: [facility]  Subjective: Chief Complaint  Patient presents with   Right Hip - Pain    HPI: Tony Davis. is a 63 y.o. male who presents to the office reporting right hip pain.  Patient had a fall 02/05/2024.  Diagnosed with minimally displaced tip of the greater trochanteric fracture on the right-hand side.  Takes Tylenol as needed.  Staying at home.  Has a history of stroke.  [facility] to [facility] 3 days a [facility].  He is able to walk around the house with a walker.  Does have balance issues..                ROS: All systems reviewed are negative as they relate to the chief complaint within the history of present illness.  Patient denies fevers or chills.  Assessment & Plan: Visit Diagnoses:  1. Fracture follow-up     Plan: Impression is slight interval increase in displacement a very small tip of the trochanter fracture.  Plan at this time is ambulation and activity as tolerated.  I do not think his physical demand level should displaced this fracture much more.  Has excellent strength today with hip muscle testing.  Follow-up as needed.  Follow-Up Instructions: No follow-ups on file.   Orders:  Orders Placed This Encounter  Procedures   XR HIP UNILAT W OR W/O PELVIS 2-3 VIEWS RIGHT   No orders of the defined types were placed in this encounter.     Procedures: No procedures performed   Clinical Data: No additional findings.  Objective: Vital Signs: There were no vitals taken for this visit.  Physical Exam:  Constitutional: Patient appears well-developed HEENT:  Head: Normocephalic Eyes:EOM are normal Neck: Normal range of motion Cardiovascular: Normal rate Pulmonary/chest: Effort  normal Neurologic: Patient is alert Skin: Skin is warm Psychiatric: Patient has normal mood and affect  Ortho Exam: Ortho exam demonstrates 5 out of 5 hip flexion abduction adduction strength.  No groin pain with internal/external rotation of either leg.  No calf tenderness to palpation bilaterally with negative Homans bilaterally.  Specialty Comments:  No specialty comments available.  Imaging: XR HIP UNILAT W OR W/O PELVIS 2-3 VIEWS RIGHT Result Date: 03/02/2024 AP pelvis lateral right hip reviewed.  Tip of greater trochanteric fracture is identified with about 1 cm of displacement.  This is slightly more than on CT scan from 1 month ago.  Moderate hip arthritis is present.    PMFS History: Patient Active Problem List   Diagnosis Date Noted   Cerebellar hemorrhage (HCC) 09/19/2023   Left renal mass 01/18/2023   Obesity (BMI 30-39.9) 04/21/2019   Altered mental status 09/20/2018   Hypokalemia 12/26/2015   Depression 02/03/2015   Diastolic dysfunction with chronic heart failure (HCC) 11/30/2014   Essential hypertension 11/03/2014   LV dysfunction    Hydrocephalus (HCC)    Basal ganglia hemorrhage (HCC) 07/30/2014   Routine general medical examination at a health care facility 02/11/2014   Insomnia, persistent 03/25/2013   Chronic rhinitis 03/25/2013   Hemiparesis due to old cerebral infarction (HCC) 03/20/2013   Folate deficiency anemia 10/13/2012   Long term (  current) use of anticoagulants 03/28/2012   Obstructive sleep apnea 08/16/2011   Prediabetes 08/06/2011   PATENT FORAMEN OVALE 03/13/2010   CAD, NATIVE VESSEL 01/26/2010   Hyperlipidemia with target LDL less than 100 01/23/2010   GERD 01/23/2010   Past Medical History:  Diagnosis Date   Acute respiratory failure (HCC) 2015   a. Following BG hemorrhage - s/p trache with decannulation.   Basal ganglia hemorrhage (HCC) 2015   a. 2015 - with intraventricular extension and hydrocephalus requiring right crani and VP  shunt, complicated by encephalopathy, protein calorie malnutrition, acute resp failure req trache and PEG, BLE DVT s/p IVC, enterobacter PNA, subsequent right hemiparesis and difficulty communicating.   BPH (benign prostatic hyperplasia)    CAD (coronary artery disease)    a. s/p promus DES circumflex artery 10/13/10. b. Cath 04/2013: stable disease but possible small vessel disease -Imdur added.   Carotid artery disease (HCC)    a. 1-29% BICA by study 04/2014.   CVA (cerebral vascular accident) (HCC) 02/2010   a. CVA 2011, prior reported TIAs. b. CNS hemorrhage 2015.   CVA (cerebral vascular accident) (HCC) 06/2014   "hemorrhagic; in [place]; brain activity diminshed; ability to do anything; can't do ADLs on own"   DVT (deep venous thrombosis) (HCC) 2013   a. 2013. b. 2015 - reported BLE DVT s/p IVC filter.    Enterobacter cloacae pneumonia (HCC) 2015   Falls    Folate deficiency anemia    GERD (gastroesophageal reflux disease)    Hematuria 07/2011   History of kidney stones    HLD (hyperlipidemia)    HTN (hypertension)    Hydrocephalus (HCC) 2015   a. 2/2 BG hemorrhage.   Incontinence of feces    Lower extremity edema    LV dysfunction    a. 2011 - EF 45-50%, subsequent 55-60% in 04/2014.   Noncompliance    Orthostatic hypotension    OSA on CPAP    "not using CPAP right now" (11/30/2014)   PFO (patent foramen ovale)    a. echo 8/12: EF 55-65%, grade 2 diast dysfnx; +PFO on bubble study. b. TEE 4/13: EF normal, atrial septum with suspicion for interatrial septum fenestrations without flow across and few large bubbles noted in LA.  This was not felt to require closure    Protein calorie malnutrition (HCC)    Rheumatoid arthritis(714.0)    S/P percutaneous endoscopic gastrostomy (PEG) tube placement (HCC) 2015   TIA (transient ischemic attack)    "1-2; both after the one in 02/2010" (09/25/2013)   Urinary incontinence     Family History  Problem Relation Age of Onset   Heart  attack Father    Stroke Father    Heart attack Sister    Hypertension Brother    Seizures Sister    Stroke Sister    Cancer Neg Hx    Kidney disease Neg Hx    Diabetes Neg Hx     Past Surgical History:  Procedure Laterality Date   CORONARY ANGIOPLASTY WITH STENT PLACEMENT  12/10/2010   "1" (09/24/2013)   CYSTOSCOPY/URETEROSCOPY/HOLMIUM LASER/STENT PLACEMENT Right 07/18/2023   Procedure: CYSTOSCOPY RIGHT URETEROSCOPY, HOLMIUM LASER LITHOTRIPSY, AND RIGHT URETERAL STENT PLACEMENT;  Surgeon: Crist Fat, MD;  Location: [facility];  Service: Urology;  Laterality: Right;  90 MINUTES   filter in place for clots      per wife at preop on 01/11/23   KNEE ARTHROSCOPY Bilateral 08/11/1999   2 on left and 3 on rt  LAPAROSCOPIC NEPHRECTOMY Left 01/18/2023   Procedure: LAPAROSCOPIC RADICAL LEFT NEPHRECTOMY;  Surgeon: Crist Fat, MD;  Location: [facility];  Service: Urology;  Laterality: Left;  210 MINUTES NEDDED FOR CASE   LEFT HEART CATHETERIZATION WITH CORONARY ANGIOGRAM N/A 04/24/2013   Procedure: LEFT HEART CATHETERIZATION WITH CORONARY ANGIOGRAM;  Surgeon: Kathleene Hazel, MD;  Location: [facility] CATH LAB;  Service: Cardiovascular;  Laterality: N/A;   shunt in head      TEE WITHOUT CARDIOVERSION  03/21/2012   Procedure: TRANSESOPHAGEAL ECHOCARDIOGRAM (TEE);  Surgeon: Pricilla Riffle, MD;  Location: [facility] ENDOSCOPY;  Service: Cardiovascular;  Laterality: N/A;   Social History   Occupational History   Not on file  Tobacco Use   Smoking status: Former    Current packs/day: 0.00    Average packs/day: 1 pack/day for 35.0 years (35.0 ttl pk-yrs)    Types: Cigarettes    Start date: 03/10/1977    Quit date: 03/10/2012    Years since quitting: 11.9   Smokeless tobacco: Never  Vaping Use   Vaping status: Never Used  Substance and Sexual Activity   Alcohol use: No    Alcohol/week: 0.0 standard drinks of alcohol   Drug use: No   Sexual activity: Not Currently    Partners: Female

## 2024-03-30 ENCOUNTER — Ambulatory Visit: Payer: Medicare (Managed Care) | Admitting: Cardiovascular Disease

## 2024-03-30 NOTE — Progress Notes (Deleted)
 No chief complaint on file.  History of Present Illness: 63 yo male with a complex medical history including CAD, prior hemorrhagic CVA in 2015 with many compilcations, sleep apnea, HTN and HLD who is here today for cardiac follow up. I met him in 2011 and arranged a stress test, an echo and carotid dopplers at the first visit. The carotid dopplers were normal. He came in for his stress test and complained of neurological changes. He was sent to Dr. Stones office and had MRI/MRA of the head and neck. MRA showed no occlusive disease. MRI showed non-specific white matter changes but no clear infarct, possibly related to hypertension. His echo showed septal and inferobasal hypokinesis with mild concentric hypertrophy, EF 45-50%. Left heart cath October 2011 with severe stenosis in the mid Circumflex. A drug eluting stent was placed in the Circumflex artery. Repeat echo August 2012 with PFO with positive bubble study. I arranged for him to see Dr. Arlester Ladd for PFO closure but he skipped his appointment. He was in [place]  April 2013 and had acute onset of left arm and leg weakness. He was admitted there. CT head without acute stroke. Venous dopplers with chronic DVT right femoral vein. Carotids with <50% RICA stenosis, LICA ok. Echo 03/14/12 in [place]  with normal LV size and function with PFO with positive bubble study. He was started on coumadin  and Lovenox  and left AMA from the hospital there. I saw him in our office 03/19/12. At that time, he continued to have left sided weakness. He had not been compliant with plans for Lovenox  and coumadin . He was admitted for Heparin  and coumadin . TEE was done 03/21/12 and showed normal LVEF. The atrial septum had interatrial septum fenestrations without flow across and few large bubbles noted in LA. This was not felt to require closure. He was maintained on coumadin  at that time. Venous dopplers 2013 with no DVT.  He was diagnosed with OSA and was started on CPAP. He was  admitted to [facility] May 2014 with chest pain. Cardiac markers were negative. Cardiac cath 04/24/13 with stable CAD. Patent stent distal Circumflex, mild disease LAD, moderate disease RCA. He had a hemorraghic stroke in [place]  in July 2015 and coumadin  was stopped. He had a prolonged hospitalization with many complications following his CVA (complicated by intraventricular extension and hydrocephalus requiring VP shunt, encephalopathy, protein calorie malnutrition, acute resp failure requiring tracheostomy and PEG, bilateral LE DVT s/p IVC filter, enterobacter pneumonia, subsequent right hemiparesis and difficulty communicating). No new neurological events between 2015 and 2023. Echo in December 2015 with normal LV size and function. He underwent laparoscopic left radical nephrectomy in February 2024. He had dizziness following his nephrectomy. Cardiac monitor May 2024 with sinus rhythm and rare premature beats. No high grade AV block. Echo June 2024 with LVEF= 60-65%. Moderate LVH. No valve disease.   He is here today for follow up. The patient denies any chest pain, dyspnea, palpitations, lower extremity edema, orthopnea, PND, dizziness, near syncope or syncope.   Primary Care Physician: [facility]  Past Medical History:  Diagnosis Date   Acute respiratory failure (HCC) 2015   a. Following BG hemorrhage - s/p trache with decannulation.   Basal ganglia hemorrhage (HCC) 2015   a. 2015 - with intraventricular extension and hydrocephalus requiring right crani and VP shunt, complicated by encephalopathy, protein calorie malnutrition, acute resp failure req trache and PEG, BLE DVT s/p IVC, enterobacter PNA, subsequent right hemiparesis and difficulty communicating.  BPH (benign prostatic hyperplasia)    CAD (coronary artery disease)    a. s/p promus DES circumflex artery 10/13/10. b. Cath 04/2013: stable disease but possible small vessel disease -Imdur  added.   Carotid  artery disease (HCC)    a. 1-29% BICA by study 04/2014.   CVA (cerebral vascular accident) (HCC) 02/2010   a. CVA 2011, prior reported TIAs. b. CNS hemorrhage 2015.   CVA (cerebral vascular accident) (HCC) 06/2014   "hemorrhagic; in [place] ; brain activity diminshed; ability to do anything; can't do ADLs on own"   DVT (deep venous thrombosis) (HCC) 2013   a. 2013. b. 2015 - reported BLE DVT s/p IVC filter.    Enterobacter cloacae pneumonia (HCC) 2015   Falls    Folate deficiency anemia    GERD (gastroesophageal reflux disease)    Hematuria 07/2011   History of kidney stones    HLD (hyperlipidemia)    HTN (hypertension)    Hydrocephalus (HCC) 2015   a. 2/2 BG hemorrhage.   Incontinence of feces    Lower extremity edema    LV dysfunction    a. 2011 - EF 45-50%, subsequent 55-60% in 04/2014.   Noncompliance    Orthostatic hypotension    OSA on CPAP    "not using CPAP right now" (11/30/2014)   PFO (patent foramen ovale)    a. echo 8/12: EF 55-65%, grade 2 diast dysfnx; +PFO on bubble study. b. TEE 4/13: EF normal, atrial septum with suspicion for interatrial septum fenestrations without flow across and few large bubbles noted in LA.  This was not felt to require closure    Protein calorie malnutrition (HCC)    Rheumatoid arthritis(714.0)    S/P percutaneous endoscopic gastrostomy (PEG) tube placement (HCC) 2015   TIA (transient ischemic attack)    "1-2; both after the one in 02/2010" (09/25/2013)   Urinary incontinence     Past Surgical History:  Procedure Laterality Date   CORONARY ANGIOPLASTY WITH STENT PLACEMENT  12/10/2010   "1" (09/24/2013)   CYSTOSCOPY/URETEROSCOPY/HOLMIUM LASER/STENT PLACEMENT Right 07/18/2023   Procedure: CYSTOSCOPY RIGHT URETEROSCOPY, HOLMIUM LASER LITHOTRIPSY, AND RIGHT URETERAL STENT PLACEMENT;  Surgeon: Andrez Banker, MD;  Location: [facility];  Service: Urology;  Laterality: Right;  90 MINUTES   filter in place for clots      per wife at preop on  01/11/23   KNEE ARTHROSCOPY Bilateral 08/11/1999   2 on left and 3 on rt   LAPAROSCOPIC NEPHRECTOMY Left 01/18/2023   Procedure: LAPAROSCOPIC RADICAL LEFT NEPHRECTOMY;  Surgeon: Andrez Banker, MD;  Location: [facility];  Service: Urology;  Laterality: Left;  210 MINUTES NEDDED FOR CASE   LEFT HEART CATHETERIZATION WITH CORONARY ANGIOGRAM N/A 04/24/2013   Procedure: LEFT HEART CATHETERIZATION WITH CORONARY ANGIOGRAM;  Surgeon: Odie Benne, MD;  Location: [facility] CATH LAB;  Service: Cardiovascular;  Laterality: N/A;   shunt in head      TEE WITHOUT CARDIOVERSION  03/21/2012   Procedure: TRANSESOPHAGEAL ECHOCARDIOGRAM (TEE);  Surgeon: Elmyra Haggard, MD;  Location: [facility] ENDOSCOPY;  Service: Cardiovascular;  Laterality: N/A;    Current Outpatient Medications  Medication Sig Dispense Refill   acetaminophen  (TYLENOL ) 325 MG tablet Take 650 mg by mouth every 6 (six) hours as needed for moderate pain.     atorvastatin  (LIPITOR ) 80 MG tablet TAKE ONE TABLET BY MOUTH DAILY 90 tablet 0   Cholecalciferol (VITAMIN D3) 25 MCG (1000 UT) CAPS Take 1,000 Units by mouth daily.     escitalopram  (LEXAPRO ) 20  MG tablet Take 20 mg by mouth daily.     famotidine  (PEPCID ) 20 MG tablet Take 20 mg by mouth daily.     hydrALAZINE  (APRESOLINE ) 100 MG tablet Take 100 mg by mouth 2 (two) times daily.     memantine  (NAMENDA ) 10 MG tablet Take 10 mg by mouth 2 (two) times daily.     metoprolol  succinate (TOPROL -XL) 100 MG 24 hr tablet Take 1 tablet (100 mg total) by mouth 2 (two) times daily. Take with or immediately following a meal. 180 tablet 3   pantoprazole  (PROTONIX ) 40 MG tablet Take 1 tablet (40 mg total) by mouth daily.     polyethylene glycol (MIRALAX / GLYCOLAX) 17 g packet Take 17 g by mouth 2 (two) times daily as needed for moderate constipation.     rivastigmine  (EXELON ) 6 MG capsule Take 6 mg by mouth 2 (two) times daily.     traZODone  (DESYREL ) 100 MG tablet Take 200 mg by mouth at bedtime.     No current  facility-administered medications for this visit.    Allergies  Allergen Reactions   Iodine Anaphylaxis   Norvasc  [Amlodipine ] Swelling and Other (See Comments)    Foot edema   Aricept [Donepezil] Other (See Comments)    Hallucinations    Social History   Socioeconomic History   Marital status: Married    Spouse name: Not on file   Number of children: Not on file   Years of education: Not on file   Highest education level: Not on file  Occupational History   Not on file  Tobacco Use   Smoking status: Former    Current packs/day: 0.00    Average packs/day: 1 pack/day for 35.0 years (35.0 ttl pk-yrs)    Types: Cigarettes    Start date: 03/10/1977    Quit date: 03/10/2012    Years since quitting: 12.0   Smokeless tobacco: Never  Vaping Use   Vaping status: Never Used  Substance and Sexual Activity   Alcohol use: No    Alcohol/week: 0.0 standard drinks of alcohol   Drug use: No   Sexual activity: Not Currently    Partners: Female  Other Topics Concern   Not on file  Social History Narrative   Married, 5 kids; lives in [place]. 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    Social Drivers of Corporate investment banker Strain: Not on file  Food Insecurity: No Food Insecurity (01/19/2023)   Hunger Vital Sign    Worried About Running Out of Food in the Last Year: Never true    Ran Out of Food in the Last Year: Never true  Transportation Needs: No Transportation Needs (01/19/2023)   PRAPARE - Administrator, Civil Service (Medical): No    Lack of Transportation (Non-Medical): No  Physical Activity: Not on file  Stress: Not on file  Social Connections: Not on file  Intimate Partner Violence: Not At Risk (01/19/2023)   Humiliation, Afraid, Rape, and Kick questionnaire    Fear of Current or Ex-Partner: No    Emotionally Abused: No    Physically Abused: No    Sexually Abused: No    Family History  Problem Relation Age of Onset   Heart attack  Father    Stroke Father    Heart attack Sister    Hypertension Brother    Seizures Sister    Stroke Sister    Cancer Neg Hx    Kidney  disease Neg Hx    Diabetes Neg Hx     Review of Systems:  As stated in the HPI and otherwise negative.   There were no vitals taken for this visit.  Physical Examination: General: Well developed, well nourished, NAD  HEENT: OP clear, mucus membranes moist  SKIN: warm, dry. No rashes. Neuro: No focal deficits  Musculoskeletal: Muscle strength 5/5 all ext  Psychiatric: Mood and affect normal  Neck: No JVD, no carotid bruits, no thyromegaly, no lymphadenopathy.  Lungs:Clear bilaterally, no wheezes, rhonci, crackles Cardiovascular: Regular rate and rhythm. No murmurs, gallops or rubs. Abdomen:Soft. Bowel sounds present. Non-tender.  Extremities: No lower extremity edema. Pulses are 2 + in the bilateral DP/PT.  EKG:  EKG is *** ordered today. The ekg ordered today demonstrates   Recent Labs: 09/19/2023: ALT 30 09/22/2023: Magnesium 2.0 09/24/2023: BUN 24; Creatinine, Ser 1.76; Hemoglobin 13.5; Platelets 220; Potassium 3.6; Sodium 139   Lipid Panel    Component Value Date/Time   CHOL 80 09/21/2023 0838   TRIG 83 09/21/2023 0838   HDL 34 (L) 09/21/2023 0838   CHOLHDL 2.4 09/21/2023 0838   VLDL 17 09/21/2023 0838   LDLCALC 29 09/21/2023 0838   LDLDIRECT 145.0 07/09/2016 1654     Wt Readings from Last 3 Encounters:  09/24/23 106.5 kg  07/29/23 110.7 kg  07/18/23 111.5 kg    Assessment and Plan:   History of recurrent DVT: First in 2013, then BLE DVT in 2015 with IVC filter placed at that time. He is not felt to be a candidate for anticoagulation due to his CNS hemorrhage  History of CVA: He is followed in Neurology  HTN: BP is controlled. Continue current therapy   CAD without angina: No chest pain. Continue ASA, beta blocker and statin.    Sleep apnea: *** ? CPAP  Labs/ tests ordered today include:   No orders of the defined  types were placed in this encounter.  Disposition:   F/U with me in one year.    Signed, Antoinette Batman, MD 03/30/2024 9:21 AM    [facility] [address] [place], [place], [place]  [address] Phone: [phone]; Fax: [phone]

## 2024-06-04 ENCOUNTER — Ambulatory Visit: Payer: Medicare (Managed Care) | Admitting: Nurse Practitioner

## 2024-06-07 NOTE — Progress Notes (Unsigned)
 Cardiology Office Note    Date:  06/11/2024  ID:  Carlin JINNY Charmayne Mickey., DOB 05-12-1961, MRN [id number] PCP:  [name], DO  Cardiologist:  Lonni Cash, MD  Electrophysiologist:  None   Chief Complaint: Follow up for CAD   History of Present Illness: .    Alben Jepsen. is a 63 y.o. male with visit-pertinent history of CAD, prior hemorrhagic CVA in 2015 with complications,  right cerebellar hemorrhage in 09/2023, sleep apnea, hypertension and hyperlipidemia.  Patient established with Dr. Cash in 2011, underwent stress test, echo and carotid Dopplers.  Carotid Dopplers were normal.  Patient had presented for his stress test and complained of neurological changes, he was sent to Dr. Sari office and had MRI/MRA of the head and neck.  MRI showed no occlusive disease.  MRI showed nonspecific white matter changes but no clear infarct, possibly related to hypertension.  Echocardiogram showed septal and infer L basal hypokinesis with mild concentric hypertrophy, EF 45 to 50%.  Left heart cath in October 2011 with severe stenosis in the mid circumflex.  A DES was placed in the circumflex artery.  Repeat echo in August 2012 with PFO with positive bubble study.  Patient was to see Dr. Wonda for consideration of PFO closure but he skipped his appointment.  In April 2013 while in [place]  he had acute onset of left arm and left leg weakness.  He was admitted there.  CT head without acute stroke.  Venous Dopplers with chronic DVT in the right femoral vein.  Carotids with less than 50% RICA stenosis, LICA near normal.  Echo on/5/13 in [place]  with normal LV size and function with PFO with positive bubble study.  He was started on Coumadin  and Lovenox  and left AMA from the hospital there.  Patient was seen by Dr. Cash in office on/10/13, patient continued to have left-sided weakness.  He did not been compliant with plans for Lovenox  and Coumadin .  He was admitted for heparin  and  Coumadin .  TEE was done on 03/21/2012 and showed normal LVEF.  The atrial septum had interatrial septum fenestrations without flow across and had few large bubbles noted in LAA.  This was not felt to require closure.  He was maintained on Coumadin  at that time.  Venous Dopplers in 2013 with no DVT.  He was diagnosed with OSA and started on CPAP.  He was admitted to [facility] in May 2014 with chest pain.  Cardiac markers were negative.  Cardiac cath on/16/14 with stable CAD, patent stent to the distal circumflex, mild disease in the LAD, moderate disease in the RCA.    In July 2015 while in [place]  patient had a hemorrhagic stroke and Coumadin  was stopped.  He had a prolonged hospitalization with many complications following his CVA (complicated by intraventricular extension and hydrocephalus requiring VP shunt, encephalopathy, protein calorie malnutrition, acute respiratory failure requiring tracheostomy and PEG, bilateral lower extremity DVT s/p IVC filter, Enterobacter pneumonia, subsequent right hemiparesis and difficulty communicating).  Echo in December 2015 with normal LV size and function.  In 01/2023 patient underwent laparoscopic left radical nephrectomy.  Patient was last seen in clinic on 05/14/2023 by Dr. Cash.  Following his procedure he reported soft blood pressures at home, reported he had needed to hold his metoprolol  and Aldactone due to soft blood pressures.  He also reported some dizziness over the prior few months with his nephrectomy.  Cardiac monitor in May 2024 showed sinus rhythm and rare premature beats.  No high-grade AV block was noted.  Patient was noted to have pneumonia of the week prior when his blood pressure had been soft, he been holding hydralazine , Aldactone and lisinopril .  As his creatinine was 2.6 post nephrectomy he was not restarted on lisinopril  or Aldactone.  He was continued on Toprol  100 mg twice daily and resumed on hydralazine  100 mg twice daily.  On chart review in  09/2023 patient was admitted on 10/11 through 10/17, patient presented with vomiting/left gaze preference.  Patient was found to have a right cerebellar hemorrhage and was admitted to the ICU.  CT of the head on 10/10 indicated acute intraparenchymal hemorrhage to the right cerebellum at 1.9 x 1.9 x 1.6 cm.  Brain MRI on 10/14 showed unchanged 2.5 cm hematoma in the cerebellum.  This felt that this was due to hypertension, patient was continued on metoprolol , hydralazine  and amlodipine .  Today he presents for follow-up with his wife.  He reports that he is doing well overall, unclear if patient is a poor historian.  Patient is wife deny any chest pain, shortness of breath, lower extremity edema, orthopnea or PND.  Patient's wife report that he has been doing extremely well overall.  She denies any cardiac concerns or complaints today.  They report that he is attending [facility] on Monday, Wednesday and Friday.  While there he attends physical therapy, was previously attending occupational therapy however has graduated.  Patient's wife reports that he is able to walk around in the house and some outside with a walker.  She notes that his functional level has slightly decreased following his hemorrhagic stroke in October, however feels that he is slowly improving.  She denies any cardiac concerns or complaints today.  ROS: .   Today he denies chest pain, shortness of breath, lower extremity edema, fatigue, palpitations, melena, hematuria, hemoptysis, diaphoresis, weakness, presyncope, syncope, orthopnea, and PND.  All other systems are reviewed and otherwise negative. Studies Reviewed: SABRA   EKG:  EKG is ordered today, personally reviewed, demonstrating  EKG Interpretation Date/Time:  Wednesday June 10 2024 14:14:35 EDT Ventricular Rate:  58 PR Interval:  148 QRS Duration:  90 QT Interval:  478 QTC Calculation: 469 R Axis:   20  Text Interpretation: Sinus bradycardia T wave inversion in Lead III, aVF  Confirmed by Elspeth Blucher [phone]) on 06/11/2024 7:38:14 PM   CV Studies: Cardiac studies reviewed are outlined and summarized above. Otherwise please see EMR for full report. Cardiac Studies & Procedures   ______________________________________________________________________________________________     ECHOCARDIOGRAM  ECHOCARDIOGRAM COMPLETE 06/06/2023  Narrative ECHOCARDIOGRAM REPORT    Patient Name:   Obaloluwa Delatte. Date of Exam: 06/06/2023 Medical Rec #:  [id number]           Height:       71.0 in Accession #:    [id number]          Weight:       245.8 lb Date of Birth:  [birth year]          BSA:          2.302 m Patient Age:    61 years            BP:           150/85 mmHg Patient Gender: M                   HR:           100 bpm. Exam Location:  [address]  Procedure: 2D Echo, 3D Echo, Cardiac Doppler, Color Doppler and Strain Analysis  Indications:    R55 Syncope I25.10 CAD  History:        Patient has prior history of Echocardiogram examinations, most recent 11/10/2014. CAD and s/p DES LCX, Stroke and History of DVT, s/p IVC filter, right hemiparesis, History PFO, Signs/Symptoms:near syncope; Risk Factors:Sleep Apnea, Hypertension, Dyslipidemia and Former Smoker. Previous echo showed LVEF 70%.  Sonographer:    Nolon Berg BA, RDCS Referring Phys: VERLIN BRUCKNER, D   Sonographer Comments: Global longitudinal strain was attempted. IMPRESSIONS   1. No LVOT obstruction. Left ventricular ejection fraction, by estimation, is 60 to 65%. The left ventricle has normal function. The left ventricle has no regional wall motion abnormalities. There is moderate concentric left ventricular hypertrophy. Left ventricular diastolic parameters are consistent with Grade I diastolic dysfunction (impaired relaxation). 2. Right ventricular systolic function is normal. The right ventricular size is normal. 3. The mitral valve is normal in structure. No evidence of mitral  valve regurgitation. 4. The aortic valve is grossly normal. Aortic valve regurgitation is not visualized. 5. The inferior vena cava is normal in size with greater than 50% respiratory variability, suggesting right atrial pressure of 3 mmHg.  FINDINGS Left Ventricle: No LVOT obstruction. Left ventricular ejection fraction, by estimation, is 60 to 65%. The left ventricle has normal function. The left ventricle has no regional wall motion abnormalities. The left ventricular internal cavity size was normal in size. There is moderate concentric left ventricular hypertrophy. Left ventricular diastolic parameters are consistent with Grade I diastolic dysfunction (impaired relaxation).  Right Ventricle: The right ventricular size is normal. Right ventricular systolic function is normal.  Left Atrium: Left atrial size was normal in size.  Right Atrium: Right atrial size was normal in size.  Pericardium: There is no evidence of pericardial effusion.  Mitral Valve: The mitral valve is normal in structure. No evidence of mitral valve regurgitation.  Tricuspid Valve: The tricuspid valve is normal in structure. Tricuspid valve regurgitation is not demonstrated.  Aortic Valve: The aortic valve is grossly normal. Aortic valve regurgitation is not visualized.  Pulmonic Valve: Pulmonic valve regurgitation is trivial.  Aorta: The aortic root and ascending aorta are structurally normal, with no evidence of dilitation.  Venous: The inferior vena cava is normal in size with greater than 50% respiratory variability, suggesting right atrial pressure of 3 mmHg.  IAS/Shunts: No atrial level shunt detected by color flow Doppler.   LEFT VENTRICLE PLAX 2D LVIDd:         4.60 cm   Diastology LVIDs:         2.10 cm   LV e' medial:    5.77 cm/s LV PW:         1.60 cm   LV E/e' medial:  10.9 LV IVS:        1.50 cm   LV e' lateral:   4.79 cm/s LVOT diam:     2.30 cm   LV E/e' lateral: 13.1 LV SV:         106 LV  SV Index:   46 LVOT Area:     4.15 cm  3D Volume EF: 3D EF:        69 % LV EDV:       95 ml LV ESV:       30 ml LV SV:        65 ml  RIGHT VENTRICLE RV Basal diam:  3.30 cm RV  Mid diam:    3.00 cm RV S prime:     18.20 cm/s TAPSE (M-mode): 3.0 cm  LEFT ATRIUM             Index        RIGHT ATRIUM           Index LA diam:        4.50 cm 1.96 cm/m   RA Area:     10.40 cm LA Vol (A2C):   23.0 ml 9.99 ml/m   RA Volume:   18.20 ml  7.91 ml/m LA Vol (A4C):   27.5 ml 11.95 ml/m LA Biplane Vol: 26.2 ml 11.38 ml/m AORTIC VALVE             PULMONIC VALVE LVOT Vmax:   117.00 cm/s PR End Diast Vel: 6.76 msec LVOT Vmean:  70.600 cm/s LVOT VTI:    0.256 m  AORTA Ao Root diam: 3.40 cm Ao Asc diam:  3.60 cm  MITRAL VALVE MV Area (PHT): 2.91 cm    SHUNTS MV Decel Time: 261 msec    Systemic VTI:  0.26 m MV E velocity: 62.80 cm/s  Systemic Diam: 2.30 cm MV A velocity: 89.90 cm/s MV E/A ratio:  0.70  Photographer signed by Ronal Ross Signature Date/Time: 06/06/2023/11:38:34 AM    Final    MONITORS  LONG TERM MONITOR (3-14 DAYS) 05/07/2023  Narrative Patch Wear Time:  13 days and 23 hours (2024-05-03T10:30:46-0400 to 2024-05-17T10:30:42-0400)  Sinus rhythm. (Minimum HR of 48 bpm, max HR of 109 bpm, and avg HR of 68 bpm). Isolated SVEs were rare (<1.0%), SVE Couplets were rare (<1.0%), and SVE Triplets were rare (<1.0%). Isolated VEs were rare (<1.0%), VE Couplets were rare (<1.0%), and no VE Triplets were present.       ______________________________________________________________________________________________       Current Reported Medications:.    Current Meds  Medication Sig   acetaminophen  (TYLENOL ) 325 MG tablet Take 650 mg by mouth every 6 (six) hours as needed for moderate pain.   atorvastatin  (LIPITOR ) 80 MG tablet TAKE ONE TABLET BY MOUTH DAILY   Cholecalciferol (VITAMIN D3) 25 MCG (1000 UT) CAPS Take 1,000 Units by mouth daily.    escitalopram  (LEXAPRO ) 20 MG tablet Take 20 mg by mouth daily.   famotidine  (PEPCID ) 20 MG tablet Take 20 mg by mouth daily.   hydrALAZINE  (APRESOLINE ) 100 MG tablet Take 100 mg by mouth 2 (two) times daily.   melatonin 3 MG TABS tablet Take 3 mg by mouth at bedtime.   memantine  (NAMENDA ) 10 MG tablet Take 10 mg by mouth 2 (two) times daily.   metoprolol  succinate (TOPROL -XL) 100 MG 24 hr tablet Take 1 tablet (100 mg total) by mouth 2 (two) times daily. Take with or immediately following a meal.   pantoprazole  (PROTONIX ) 40 MG tablet Take 1 tablet (40 mg total) by mouth daily.   polyethylene glycol (MIRALAX / GLYCOLAX) 17 g packet Take 17 g by mouth 2 (two) times daily as needed for moderate constipation.   rivastigmine  (EXELON ) 6 MG capsule Take 6 mg by mouth 2 (two) times daily.   Physical Exam:    VS:  BP 124/78   Pulse (!) 58   Ht 5' 11 (1.803 m)   Wt 224 lb (101.6 kg)   SpO2 97%   BMI 31.24 kg/m    Wt Readings from Last 3 Encounters:  06/10/24 224 lb (101.6 kg)  09/24/23 234 lb 12.6 oz (106.5 kg)  07/29/23 244 lb (110.7 kg)    GEN: Well nourished, well developed in no acute distress NECK: No JVD; No carotid bruits CARDIAC: RRR, no murmurs, rubs, gallops RESPIRATORY:  Clear to auscultation without rales, wheezing or rhonchi  ABDOMEN: Soft, non-tender, non-distended EXTREMITIES:  No edema; No acute deformity     Asessement and Plan:.    CAD: Patient with history of DES to mid circumflex in October 2011 cardiac cath in 2014 with stable CAD, patent stent to the distal circumflex, mild disease in the LAD, moderate disease in the RCA.  Today patient denies any chest pain or increased shortness of breath, patient's wife sec. this stating that he has not reported any chest pain or shortness of breath.  He is attending [facility] 3 times a week, attends physical therapy and was attending occupational therapy, has since graduated.  Per his wife he has remained stable from a cardiac standpoint.   Continue Lipitor  80 mg daily, hydralazine  100 mg twice daily and metoprolol  succinate 100 mg twice daily.  HTN: Blood pressure today 124/78.  Continue current antihypertensive regimen  History of recurrent DVT: First DVT in 2013 and then BLE DVT in 2015 with IVC filter placed at that time.  Patient is not felt to be a candidate for anticoagulation given CNS hemorrhage.  History of CVA: Patient with history of left basal ganglia hemorrhage complicated by hydrocephalus s/p VP shunt placement in 2015.  Patient found to have right cell Beller hemorrhage in 09/2023, felt to be related to hypertension.  Patient's wife notes that he did did have a setback with his functional level following his hemorrhagic stroke in 2024.  He has been working with [facility] on physical therapy and previously Occupational Therapy.  He is able to walk around in the house and outside with a walker.  He is following with neurology.  Hyperlipidemia: Last lipid profile on 09/21/2023 indicated total cholesterol 80, HDL 34, triglycerides 83 and LDL 29.  Continue atorvastatin  80 mg daily.  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   Disposition: F/u with Dr. Verlin in four months   Signed, Ziomara Birenbaum D Karyss Frese, NP

## 2024-06-08 ENCOUNTER — Encounter (HOSPITAL_BASED_OUTPATIENT_CLINIC_OR_DEPARTMENT_OTHER): Payer: Self-pay

## 2024-06-10 ENCOUNTER — Encounter: Payer: Self-pay | Admitting: Cardiology

## 2024-06-10 ENCOUNTER — Ambulatory Visit: Payer: Medicare (Managed Care) | Attending: Cardiology | Admitting: Cardiology

## 2024-06-10 VITALS — BP 124/78 | HR 58 | Ht 71.0 in | Wt 224.0 lb

## 2024-06-10 DIAGNOSIS — I2510 Atherosclerotic heart disease of native coronary artery without angina pectoris: Secondary | ICD-10-CM | POA: Diagnosis not present

## 2024-06-10 DIAGNOSIS — Q2112 Patent foramen ovale: Secondary | ICD-10-CM

## 2024-06-10 DIAGNOSIS — E782 Mixed hyperlipidemia: Secondary | ICD-10-CM

## 2024-06-10 DIAGNOSIS — Z86718 Personal history of other venous thrombosis and embolism: Secondary | ICD-10-CM | POA: Diagnosis not present

## 2024-06-10 DIAGNOSIS — I10 Essential (primary) hypertension: Secondary | ICD-10-CM

## 2024-06-10 DIAGNOSIS — I639 Cerebral infarction, unspecified: Secondary | ICD-10-CM

## 2024-06-10 NOTE — Patient Instructions (Addendum)
 Medication Instructions:  No changes *If you need a refill on your cardiac medications before your next appointment, please call your pharmacy*  Lab Work: N0 labs  Testing/Procedures: No testing  Follow-Up: At [facility], you and your health needs are our priority.  As part of our continuing mission to provide you with exceptional heart care, our providers are all part of one team.  This team includes your primary Cardiologist (physician) and Advanced Practice Providers or APPs (Physician Assistants and Nurse Practitioners) who all work together to provide you with the care you need, when you need it.  Your next appointment:   4 month(s)  Provider:   Lonni Cash, MD    We recommend signing up for the patient portal called MyChart.  Sign up information is provided on this After Visit Summary.  MyChart is used to connect with patients for Virtual Visits (Telemedicine).  Patients are able to view lab/test results, encounter notes, upcoming appointments, etc.  Non-urgent messages can be sent to your provider as well.   To learn more about what you can do with MyChart, go to [email or web address].

## 2024-06-11 ENCOUNTER — Encounter: Payer: Self-pay | Admitting: Cardiology

## 2024-07-11 ENCOUNTER — Other Ambulatory Visit: Payer: Self-pay

## 2024-07-11 ENCOUNTER — Emergency Department (HOSPITAL_COMMUNITY): Payer: Medicare (Managed Care)

## 2024-07-11 ENCOUNTER — Inpatient Hospital Stay (HOSPITAL_COMMUNITY)
Admission: EM | Admit: 2024-07-11 | Discharge: 2024-07-15 | DRG: 100 | Disposition: A | Discharge status: Home Health | Payer: Medicare (Managed Care) | Attending: Family Medicine | Admitting: Family Medicine

## 2024-07-11 ENCOUNTER — Encounter (HOSPITAL_COMMUNITY): Payer: Self-pay

## 2024-07-11 DIAGNOSIS — H5500 Unspecified nystagmus: Secondary | ICD-10-CM | POA: Diagnosis present

## 2024-07-11 DIAGNOSIS — R569 Unspecified convulsions: Principal | ICD-10-CM | POA: Diagnosis present

## 2024-07-11 DIAGNOSIS — G4089 Other seizures: Secondary | ICD-10-CM | POA: Diagnosis not present

## 2024-07-11 DIAGNOSIS — K219 Gastro-esophageal reflux disease without esophagitis: Secondary | ICD-10-CM | POA: Diagnosis present

## 2024-07-11 DIAGNOSIS — Z8701 Personal history of pneumonia (recurrent): Secondary | ICD-10-CM

## 2024-07-11 DIAGNOSIS — Z95828 Presence of other vascular implants and grafts: Secondary | ICD-10-CM

## 2024-07-11 DIAGNOSIS — Z87442 Personal history of urinary calculi: Secondary | ICD-10-CM

## 2024-07-11 DIAGNOSIS — E538 Deficiency of other specified B group vitamins: Secondary | ICD-10-CM | POA: Diagnosis present

## 2024-07-11 DIAGNOSIS — N1831 Chronic kidney disease, stage 3a: Secondary | ICD-10-CM | POA: Diagnosis present

## 2024-07-11 DIAGNOSIS — Z85528 Personal history of other malignant neoplasm of kidney: Secondary | ICD-10-CM

## 2024-07-11 DIAGNOSIS — G4733 Obstructive sleep apnea (adult) (pediatric): Secondary | ICD-10-CM | POA: Diagnosis present

## 2024-07-11 DIAGNOSIS — Z8249 Family history of ischemic heart disease and other diseases of the circulatory system: Secondary | ICD-10-CM

## 2024-07-11 DIAGNOSIS — Z86718 Personal history of other venous thrombosis and embolism: Secondary | ICD-10-CM

## 2024-07-11 DIAGNOSIS — M069 Rheumatoid arthritis, unspecified: Secondary | ICD-10-CM | POA: Diagnosis present

## 2024-07-11 DIAGNOSIS — Z982 Presence of cerebrospinal fluid drainage device: Secondary | ICD-10-CM

## 2024-07-11 DIAGNOSIS — R5383 Other fatigue: Secondary | ICD-10-CM | POA: Diagnosis present

## 2024-07-11 DIAGNOSIS — I10 Essential (primary) hypertension: Secondary | ICD-10-CM | POA: Diagnosis present

## 2024-07-11 DIAGNOSIS — Z6832 Body mass index (BMI) 32.0-32.9, adult: Secondary | ICD-10-CM

## 2024-07-11 DIAGNOSIS — Z91041 Radiographic dye allergy status: Secondary | ICD-10-CM

## 2024-07-11 DIAGNOSIS — Z905 Acquired absence of kidney: Secondary | ICD-10-CM

## 2024-07-11 DIAGNOSIS — G928 Other toxic encephalopathy: Secondary | ICD-10-CM | POA: Diagnosis present

## 2024-07-11 DIAGNOSIS — Z888 Allergy status to other drugs, medicaments and biological substances status: Secondary | ICD-10-CM

## 2024-07-11 DIAGNOSIS — I160 Hypertensive urgency: Secondary | ICD-10-CM | POA: Diagnosis present

## 2024-07-11 DIAGNOSIS — E66811 Obesity, class 1: Secondary | ICD-10-CM | POA: Diagnosis present

## 2024-07-11 DIAGNOSIS — Q2112 Patent foramen ovale: Secondary | ICD-10-CM

## 2024-07-11 DIAGNOSIS — I2510 Atherosclerotic heart disease of native coronary artery without angina pectoris: Secondary | ICD-10-CM | POA: Diagnosis present

## 2024-07-11 DIAGNOSIS — Z955 Presence of coronary angioplasty implant and graft: Secondary | ICD-10-CM

## 2024-07-11 DIAGNOSIS — I129 Hypertensive chronic kidney disease with stage 1 through stage 4 chronic kidney disease, or unspecified chronic kidney disease: Secondary | ICD-10-CM | POA: Diagnosis present

## 2024-07-11 DIAGNOSIS — F0150 Vascular dementia without behavioral disturbance: Secondary | ICD-10-CM | POA: Diagnosis present

## 2024-07-11 DIAGNOSIS — I69151 Hemiplegia and hemiparesis following nontraumatic intracerebral hemorrhage affecting right dominant side: Secondary | ICD-10-CM

## 2024-07-11 DIAGNOSIS — E785 Hyperlipidemia, unspecified: Secondary | ICD-10-CM | POA: Diagnosis present

## 2024-07-11 DIAGNOSIS — N400 Enlarged prostate without lower urinary tract symptoms: Secondary | ICD-10-CM | POA: Diagnosis present

## 2024-07-11 DIAGNOSIS — I69359 Hemiplegia and hemiparesis following cerebral infarction affecting unspecified side: Secondary | ICD-10-CM

## 2024-07-11 DIAGNOSIS — R4182 Altered mental status, unspecified: Secondary | ICD-10-CM | POA: Diagnosis not present

## 2024-07-11 DIAGNOSIS — R4701 Aphasia: Secondary | ICD-10-CM | POA: Diagnosis present

## 2024-07-11 DIAGNOSIS — G919 Hydrocephalus, unspecified: Secondary | ICD-10-CM | POA: Diagnosis present

## 2024-07-11 DIAGNOSIS — Z931 Gastrostomy status: Secondary | ICD-10-CM

## 2024-07-11 DIAGNOSIS — Z823 Family history of stroke: Secondary | ICD-10-CM

## 2024-07-11 DIAGNOSIS — Z79899 Other long term (current) drug therapy: Secondary | ICD-10-CM

## 2024-07-11 DIAGNOSIS — Z87891 Personal history of nicotine dependence: Secondary | ICD-10-CM

## 2024-07-11 LAB — DIFFERENTIAL
Abs Immature Granulocytes: 0.01 K/uL (ref 0.00–0.07)
Basophils Absolute: 0 K/uL (ref 0.0–0.1)
Basophils Relative: 1 %
Eosinophils Absolute: 0.3 K/uL (ref 0.0–0.5)
Eosinophils Relative: 4 %
Immature Granulocytes: 0 %
Lymphocytes Relative: 25 %
Lymphs Abs: 1.7 K/uL (ref 0.7–4.0)
Monocytes Absolute: 0.5 K/uL (ref 0.1–1.0)
Monocytes Relative: 8 %
Neutro Abs: 4.2 K/uL (ref 1.7–7.7)
Neutrophils Relative %: 62 %

## 2024-07-11 LAB — CBC
HCT: 45.4 % (ref 39.0–52.0)
Hemoglobin: 14.7 g/dL (ref 13.0–17.0)
MCH: 30.9 pg (ref 26.0–34.0)
MCHC: 32.4 g/dL (ref 30.0–36.0)
MCV: 95.4 fL (ref 80.0–100.0)
Platelets: 237 K/uL (ref 150–400)
RBC: 4.76 MIL/uL (ref 4.22–5.81)
RDW: 12.8 % (ref 11.5–15.5)
WBC: 6.8 K/uL (ref 4.0–10.5)
nRBC: 0 % (ref 0.0–0.2)

## 2024-07-11 LAB — I-STAT CHEM 8, ED
BUN: 19 mg/dL (ref 6–20)
Calcium, Ion: 1.08 mmol/L — ABNORMAL LOW (ref 1.15–1.40)
Chloride: 103 mmol/L (ref 98–111)
Creatinine, Ser: 1.7 mg/dL — ABNORMAL HIGH (ref 0.61–1.24)
Glucose, Bld: 124 mg/dL — ABNORMAL HIGH (ref 70–99)
HCT: 44 % (ref 39.0–52.0)
Hemoglobin: 15 g/dL (ref 13.0–17.0)
Potassium: 3.6 mmol/L (ref 3.5–5.1)
Sodium: 143 mmol/L (ref 135–145)
TCO2: 29 mmol/L (ref 22–32)

## 2024-07-11 LAB — COMPREHENSIVE METABOLIC PANEL WITH GFR
ALT: 24 U/L (ref 0–44)
AST: 32 U/L (ref 15–41)
Albumin: 3.8 g/dL (ref 3.5–5.0)
Alkaline Phosphatase: 102 U/L (ref 38–126)
Anion gap: 9 (ref 5–15)
BUN: 18 mg/dL (ref 6–20)
CO2: 29 mmol/L (ref 22–32)
Calcium: 9.2 mg/dL (ref 8.9–10.3)
Chloride: 104 mmol/L (ref 98–111)
Creatinine, Ser: 1.62 mg/dL — ABNORMAL HIGH (ref 0.61–1.24)
GFR, Estimated: 48 mL/min — ABNORMAL LOW (ref >60)
Glucose, Bld: 124 mg/dL — ABNORMAL HIGH (ref 70–99)
Potassium: 3.8 mmol/L (ref 3.5–5.1)
Sodium: 142 mmol/L (ref 135–145)
Total Bilirubin: 0.9 mg/dL (ref 0.0–1.2)
Total Protein: 7 g/dL (ref 6.5–8.1)

## 2024-07-11 LAB — ETHANOL: Alcohol, Ethyl (B): <15 mg/dL (ref <15)

## 2024-07-11 LAB — APTT: aPTT: 30 s (ref 24–36)

## 2024-07-11 LAB — PROTIME-INR
INR: 1 (ref 0.8–1.2)
Prothrombin Time: 14.1 s (ref 11.4–15.2)

## 2024-07-11 LAB — CBG MONITORING, ED: Glucose-Capillary: 122 mg/dL — ABNORMAL HIGH (ref 70–99)

## 2024-07-11 MED ORDER — LEVETIRACETAM (KEPPRA) 500 MG/5 ML ADULT IV PUSH
2000.0000 mg | Freq: Once | INTRAVENOUS | Status: AC
  Administered 2024-07-11: 2000 mg via INTRAVENOUS

## 2024-07-11 MED ORDER — LORAZEPAM 2 MG/ML IJ SOLN
INTRAMUSCULAR | Status: AC
  Filled 2024-07-11: qty 1

## 2024-07-11 MED ORDER — LORAZEPAM 2 MG/ML IJ SOLN
1.0000 mg | Freq: Once | INTRAMUSCULAR | Status: AC
  Administered 2024-07-11: 1 mg via INTRAVENOUS

## 2024-07-11 NOTE — ED Provider Notes (Signed)
 [facility] Provider Note   CSN: [id number] Arrival date & time: 07/11/24  2204     Patient presents with: Altered Mental Status   Tony Davis. is a 63 y.o. male is the of hydrocephalus status post VP shunt, previous right cerebellar hemorrhage, here presenting with altered mental status.  Last normal was around 6:30 pm.  Patient's wife gave him some food around 6 PM and then talk to him around 6:30 PM and he was responsive.  She noticed that he did not eat any food by 7 PM so went into check in on him and he was not talking to him at that time.  Family waited and patient did not improve.  Patient apparently told family that he did not want to talk to them at that time.  He was able to talk to the nurse but when I tried to interview him, he stopped talking and started having eye deviation to the left. Code stroke was activated at 10:20 pm   The history is provided by the patient.       Prior to Admission medications   Medication Sig Start Date End Date Taking? Authorizing Provider  acetaminophen  (TYLENOL ) 325 MG tablet Take 650 mg by mouth every 6 (six) hours as needed for moderate pain.    [provider]  atorvastatin  (LIPITOR ) 80 MG tablet TAKE ONE TABLET BY MOUTH DAILY 08/16/18   Joshua Debby CROME, MD  Cholecalciferol (VITAMIN D3) 25 MCG (1000 UT) CAPS Take 1,000 Units by mouth daily.    [provider]  escitalopram  (LEXAPRO ) 20 MG tablet Take 20 mg by mouth daily.    [provider]  famotidine  (PEPCID ) 20 MG tablet Take 20 mg by mouth daily.    [provider]  hydrALAZINE  (APRESOLINE ) 100 MG tablet Take 100 mg by mouth 2 (two) times daily.    [provider]  melatonin 3 MG TABS tablet Take 3 mg by mouth at bedtime.    [provider]  memantine  (NAMENDA ) 10 MG tablet Take 10 mg by mouth 2 (two) times daily. 10/30/21   [provider]  metoprolol  succinate (TOPROL -XL) 100  MG 24 hr tablet Take 1 tablet (100 mg total) by mouth 2 (two) times daily. Take with or immediately following a meal. 12/25/21   Verlin Lonni BIRCH, MD  pantoprazole  (PROTONIX ) 40 MG tablet Take 1 tablet (40 mg total) by mouth daily. 09/27/23   Ghimire, Donalda HERO, MD  polyethylene glycol (MIRALAX / GLYCOLAX) 17 g packet Take 17 g by mouth 2 (two) times daily as needed for moderate constipation.    [provider]  rivastigmine  (EXELON ) 6 MG capsule Take 6 mg by mouth 2 (two) times daily. 11/11/21   [provider]    Allergies: Iodine, Norvasc  [amlodipine ], and Aricept [donepezil]    Review of Systems  Neurological:  Positive for speech difficulty.  Psychiatric/Behavioral:  Positive for confusion.   All other systems reviewed and are negative.   Updated Vital Signs BP (!) 164/110   Pulse 61   Temp 97.6 F (36.4 C) (Oral)   Resp 16   Ht 6' (1.829 m)   Wt 108.9 kg   SpO2 98%   BMI 32.55 kg/m   Physical Exam Vitals and nursing note reviewed.  Constitutional:      Comments: Aphasic with eye deviation to the left  HENT:     Head: Normocephalic.     Nose: Nose normal.  Mouth/Throat:     Mouth: Mucous membranes are moist.  Eyes:     Extraocular Movements: Extraocular movements intact.     Pupils: Pupils are equal, round, and reactive to light.  Cardiovascular:     Rate and Rhythm: Normal rate and regular rhythm.     Pulses: Normal pulses.     Heart sounds: Normal heart sounds.  Pulmonary:     Effort: Pulmonary effort is normal.     Breath sounds: Normal breath sounds.  Abdominal:     General: Abdomen is flat.     Palpations: Abdomen is soft.  Musculoskeletal:        General: Normal range of motion.     Cervical back: Normal range of motion and neck supple.  Skin:    General: Skin is warm.     Capillary Refill: Capillary refill takes less than 2 seconds.  Neurological:     Comments: Patient has some expressive aphasia.  Patient's strength is 4  out of 5 on the left arm and leg and 5 out of 5 right arm and leg.  Patient also has eye deviation to the left  Psychiatric:        Mood and Affect: Mood normal.        Behavior: Behavior normal.     (all labs ordered are listed, but only abnormal results are displayed) Labs Reviewed  COMPREHENSIVE METABOLIC PANEL WITH GFR - Abnormal; Notable for the following components:      Result Value   Glucose, Bld 124 (*)    Creatinine, Ser 1.62 (*)    GFR, Estimated 48 (*)    All other components within normal limits  CBG MONITORING, ED - Abnormal; Notable for the following components:   Glucose-Capillary 122 (*)    All other components within normal limits  I-STAT CHEM 8, ED - Abnormal; Notable for the following components:   Creatinine, Ser 1.70 (*)    Glucose, Bld 124 (*)    Calcium , Ion 1.08 (*)    All other components within normal limits  PROTIME-INR  APTT  CBC  DIFFERENTIAL  ETHANOL  RAPID URINE DRUG SCREEN, HOSP PERFORMED    EKG: None  Radiology: CT HEAD CODE STROKE WO CONTRAST Result Date: 07/11/2024 EXAM: CT HEAD WITHOUT CONTRAST 07/11/2024 10:27:16 PM TECHNIQUE: CT of the head was performed without the administration of intravenous contrast. Automated exposure control, iterative reconstruction, and/or weight based adjustment of the mA/kV was utilized to reduce the radiation dose to as low as reasonably achievable. COMPARISON: 09/21/2023 CLINICAL HISTORY: Neuro deficit, acute, stroke suspected. FINDINGS: BRAIN AND VENTRICLES: No acute hemorrhage. ASPECTS is 10. Size and configuration of the ventricles are unchanged. Right frontal approach shunt catheter with tip near the right foramen of [place]. ORBITS: No acute abnormality. SINUSES: No acute abnormality. SOFT TISSUES AND SKULL: No acute soft tissue abnormality. No skull fracture. IMPRESSION: 1. No acute intracranial abnormality. 2. Right frontal approach shunt catheter with tip near the right foramen of Monroe. Unchanged size and  configuration of the ventricles. 3. ASPECTS is 10 4. Findings communicated to Dr. Michaela at 10:31 pm Electronically signed by: Franky Stanford MD 07/11/2024 10:35 PM EDT RP Workstation: [id number]     Procedures   Medications Ordered in the ED  levETIRAcetam  (KEPPRA ) undiluted injection 2,000 mg (2,000 mg Intravenous Given 07/11/24 2249)  LORazepam  (ATIVAN ) injection 1 mg (1 mg Intravenous Given 07/11/24 2240)  LORazepam  (ATIVAN ) injection 1 mg (1 mg Intravenous Given 07/11/24 2230)    Clinical Course as  of 07/11/24 2311  Sat Jul 11, 2024  2309 Stable HO DHY Hx of IC shunt AMS at 7pm Cleared up then again worsened Concern for focal szs Seen by neuro S/p keppra  Has not been taking meds  [CC]    Clinical Course User Index [CC] Jerral Meth, MD                                 Medical Decision Making Tony PARAS Endi Lagman. is a 63 y.o. male here presenting with altered mental status.  Patient's last normal was around 6:30 PM when wife talk to him.  Patient has some expressive aphasia and eye deviation to the left.  Concern for another stroke or intracranial bleeding or seizure.  Code stroke was activated at 10:20 PM.  Plan to do CBC CMP and CT head and CTA head and neck.  11:10 PM Labs unremarkable.  CT head unremarkable.  Loaded with IV Keppra  and Ativan .  Hospitalist to admit  Problems Addressed: Seizure [facility]): acute illness or injury  Amount and/or Complexity of Data Reviewed Labs: ordered. Decision-making details documented in ED Course. Radiology: ordered and independent interpretation performed. Decision-making details documented in ED Course.  Risk Decision regarding hospitalization.     Final diagnoses:  Seizure [facility])    ED Discharge Orders     None          Patt Alm Macho, MD 07/11/24 2311

## 2024-07-11 NOTE — H&P (Signed)
 History and Physical    Tony Davis. FMW:[id number] DOB: December 27, 1960 DOA: 07/11/2024  PCP: [name], DO  Patient coming from: Home  I have personally briefly reviewed patient's old medical records in [facility]  Chief Complaint: Aphasia  HPI: Tony Davis. is a 63 y.o. 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  Patient unable to provide history due to postictal state and is otherwise supplemented by EDP and chart review.  Patient has chronic mild right-sided residual weakness from remote stroke.  Normally ambulates on his own power in the house but uses a walker outside of the home.  Patient last known normal around 6:30 PM 8/2.  After dinner his wife noticed that he was not talking to her and was less interactive.  He appeared very fatigued.  Family were concerned he was having a stroke therefore EMS were called and he was brought to the ED for evaluation.  Patient remains somnolent in suspected postictal state.  Continuous EEG active and running.  ED Course  Labs/Imaging on admission: I have personally reviewed following labs and imaging studies.  Initial vitals showed BP 165/105, pulse 61, RR 15, temp 97.6 F, SpO2 98% on room air.  Labs showed sodium 142, potassium 3.8, bicarb 29, BUN 18, creatinine 1.62, serum glucose 124, LFTs within normal limits, WBC 6.8, hemoglobin 14.7, platelets 237, serum ethanol <15.  UDS pending collection.  CT head without contrast negative for acute intracranial abnormality.  Right frontal approach shunt catheter with tip near the right foramen of [place].  Unchanged size and  configuration of the ventricles.  Per EDP patient was speaking appropriately when he developed sudden aphasia and left eye deviation midsentence concerning for focal seizure.  He was given IV Ativan  1 mg x 2 and IV Keppra  2000 mg.  Neurology consulted and recommended medical admission.  The hospitalist service was consulted to admit.  Review of Systems: All systems reviewed and are negative except as documented in history of present illness above.   Past Medical History:  Diagnosis Date   Acute respiratory failure (HCC) 2015   a. Following BG hemorrhage - s/p trache with decannulation.   Basal ganglia hemorrhage (HCC) 2015   a. 2015 - with intraventricular extension and hydrocephalus requiring right crani and VP shunt, complicated by encephalopathy, protein calorie malnutrition, acute resp failure req trache and PEG, BLE DVT s/p IVC, enterobacter PNA, subsequent right hemiparesis and difficulty communicating.   BPH (benign prostatic hyperplasia)    CAD (coronary artery disease)    a. s/p promus DES circumflex artery 10/13/10. b. Cath 04/2013: stable disease but possible small vessel disease -Imdur  added.   Carotid artery disease (HCC)    a. 1-29% BICA by study 04/2014.   CVA (cerebral vascular accident) (HCC) 02/2010   a. CVA 2011, prior reported TIAs. b. CNS hemorrhage 2015.   CVA (cerebral vascular accident) (HCC) 06/2014   hemorrhagic; in [place] ; brain activity diminshed; ability to do anything; can't do ADLs on own   DVT (deep venous thrombosis) (HCC) 2013   a. 2013. b. 2015 - reported BLE DVT s/p IVC filter.    Enterobacter cloacae pneumonia (HCC) 2015   Falls  Folate deficiency anemia    GERD (gastroesophageal reflux disease)    Hematuria 07/2011   History of kidney stones    HLD (hyperlipidemia)    HTN (hypertension)    Hydrocephalus (HCC) 2015   a. 2/2 BG hemorrhage.   Incontinence of feces    Lower extremity edema    LV dysfunction    a. 2011 - EF 45-50%, subsequent  55-60% in 04/2014.   Noncompliance    Orthostatic hypotension    OSA on CPAP    not using CPAP right now (11/30/2014)   PFO (patent foramen ovale)    a. echo 8/12: EF 55-65%, grade 2 diast dysfnx; +PFO on bubble study. b. TEE 4/13: EF normal, atrial septum with suspicion for interatrial septum fenestrations without flow across and few large bubbles noted in LA.  This was not felt to require closure    Protein calorie malnutrition (HCC)    Rheumatoid arthritis(714.0)    S/P percutaneous endoscopic gastrostomy (PEG) tube placement (HCC) 2015   TIA (transient ischemic attack)    1-2; both after the one in 02/2010 (09/25/2013)   Urinary incontinence     Past Surgical History:  Procedure Laterality Date   CORONARY ANGIOPLASTY WITH STENT PLACEMENT  12/10/2010   1 (09/24/2013)   CYSTOSCOPY/URETEROSCOPY/HOLMIUM LASER/STENT PLACEMENT Right 07/18/2023   Procedure: CYSTOSCOPY RIGHT URETEROSCOPY, HOLMIUM LASER LITHOTRIPSY, AND RIGHT URETERAL STENT PLACEMENT;  Surgeon: Cam Morene ORN, MD;  Location: [facility] ORS;  Service: Urology;  Laterality: Right;  90 MINUTES   filter in place for clots      per wife at preop on 01/11/23   KNEE ARTHROSCOPY Bilateral 08/11/1999   2 on left and 3 on rt   LAPAROSCOPIC NEPHRECTOMY Left 01/18/2023   Procedure: LAPAROSCOPIC RADICAL LEFT NEPHRECTOMY;  Surgeon: Cam Morene ORN, MD;  Location: [facility];  Service: Urology;  Laterality: Left;  210 MINUTES NEDDED FOR CASE   LEFT HEART CATHETERIZATION WITH CORONARY ANGIOGRAM N/A 04/24/2013   Procedure: LEFT HEART CATHETERIZATION WITH CORONARY ANGIOGRAM;  Surgeon: Lonni JONETTA Cash, MD;  Location: [facility] CATH LAB;  Service: Cardiovascular;  Laterality: N/A;   shunt in head      TEE WITHOUT CARDIOVERSION  03/21/2012   Procedure: TRANSESOPHAGEAL ECHOCARDIOGRAM (TEE);  Surgeon: Vina LULLA Gull, MD;  Location: [facility] ENDOSCOPY;  Service: Cardiovascular;  Laterality: N/A;    Social History: Social History   Tobacco Use   Smoking  status: Former    Current packs/day: 0.00    Average packs/day: 1 pack/day for 35.0 years (35.0 ttl pk-yrs)    Types: Cigarettes    Start date: 03/10/1977    Quit date: 03/10/2012    Years since quitting: 12.3   Smokeless tobacco: Never  Vaping Use   Vaping status: Never Used  Substance Use Topics   Alcohol use: No    Alcohol/week: 0.0 standard drinks of alcohol   Drug use: No    Allergies  Allergen Reactions   Iodine Anaphylaxis   Norvasc  [Amlodipine ] Swelling and Other (See Comments)    Foot edema   Aricept [Donepezil] Other (See Comments)    Hallucinations    Family History  Problem Relation Age of Onset   Heart attack Father    Stroke Father    Heart attack Sister    Hypertension Brother    Seizures Sister    Stroke Sister    Cancer Neg Hx    Kidney disease Neg Hx    Diabetes Neg Hx      Prior to Admission  medications   Medication Sig Start Date End Date Taking? Authorizing Provider  acetaminophen  (TYLENOL ) 325 MG tablet Take 650 mg by mouth every 6 (six) hours as needed for moderate pain.    [provider]  atorvastatin  (LIPITOR ) 80 MG tablet TAKE ONE TABLET BY MOUTH DAILY 08/16/18   Joshua Debby CROME, MD  Cholecalciferol (VITAMIN D3) 25 MCG (1000 UT) CAPS Take 1,000 Units by mouth daily.    [provider]  escitalopram  (LEXAPRO ) 20 MG tablet Take 20 mg by mouth daily.    [provider]  famotidine  (PEPCID ) 20 MG tablet Take 20 mg by mouth daily.    [provider]  hydrALAZINE  (APRESOLINE ) 100 MG tablet Take 100 mg by mouth 2 (two) times daily.    [provider]  melatonin 3 MG TABS tablet Take 3 mg by mouth at bedtime.    [provider]  memantine  (NAMENDA ) 10 MG tablet Take 10 mg by mouth 2 (two) times daily. 10/30/21   [provider]  metoprolol  succinate (TOPROL -XL) 100 MG 24 hr tablet Take 1 tablet (100 mg total) by mouth 2 (two) times daily. Take with or immediately following a meal. 12/25/21    Verlin Lonni BIRCH, MD  pantoprazole  (PROTONIX ) 40 MG tablet Take 1 tablet (40 mg total) by mouth daily. 09/27/23   Ghimire, Donalda HERO, MD  polyethylene glycol (MIRALAX / GLYCOLAX) 17 g packet Take 17 g by mouth 2 (two) times daily as needed for moderate constipation.    [provider]  rivastigmine  (EXELON ) 6 MG capsule Take 6 mg by mouth 2 (two) times daily. 11/11/21   [provider]    Physical Exam: Vitals:   07/11/24 2209 07/11/24 2211 07/11/24 2230 07/12/24 0030  BP:   (!) 164/110 (!) 167/104  Pulse:   61 60  Resp:   16 14  Temp: 97.6 F (36.4 C)     TempSrc: Oral     SpO2:   98% 98%  Weight:  108.9 kg    Height:  6' (1.829 m)    Exam limited due to postictal state Constitutional: Resting in bed, postictal and unarousable Eyes:  lids and conjunctivae normal ENMT: Mucous membranes are moist. Posterior pharynx clear of any exudate or lesions.Normal dentition.  Neck: normal, supple, no masses. Respiratory: clear to auscultation anteriorly. Normal respiratory effort. No accessory muscle use.  Cardiovascular: Regular rate and rhythm, no murmurs / rubs / gallops. No extremity edema. 2+ pedal pulses. Abdomen: Soft, no obvious tenderness, no masses palpated. Musculoskeletal: no clubbing / cyanosis. No joint deformity upper and lower extremities.  Skin: no rashes, lesions, ulcers. No induration Neurologic: Postictal/somnolent, not following commands Psychiatric: Postictal, somnolent, not following commands  EKG: Personally reviewed. Sinus rhythm, rate 63, T wave flattening.  Similar to previous.  Assessment/Plan Principal Problem:   Seizure (HCC) Active Problems:   History of hemorrhagic stroke with residual hemiparesis (HCC)   Hyperlipidemia with target LDL less than 100   CAD, NATIVE VESSEL   Hydrocephalus (HCC)   Essential hypertension   Vascular dementia (HCC)   Chronic kidney disease, stage 3a (HCC)   Tradarius Reinwald. is a 63 y.o. 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  Assessment and Plan: Seizure activity: Patient had witnessed episode of sudden aphasia and left eye deviation while in the ED suspicious for seizure activity.  Neurology recommended admission for further workup.  CT head negative for acute changes. - Started on cEEG - Seizure precautions - Antiepileptics as per neurology  History of hemorrhagic stroke complicated by hydrocephalus s/p VP shunt: Has chronic mild right-sided residual weakness.  CT head negative for acute changes or VP shunt complication.  CKD stage IIIa Clear-cell papillary renal cancer s/p left radical nephrectomy (01/2023): Renal function stable.  Continue to monitor.  CAD s/p DES to dCx 2011: Continue atorvastatin .  Not on antiplatelets due to history of hemorrhagic stroke.  Hypertension: Continue hydralazine  and Toprol -XL.  Hyperlipidemia: Continue atorvastatin .  Vascular dementia: Continue Namenda  and Exelon .  Delirium precautions.   DVT prophylaxis: SCDs Start: 07/12/24 0108 Code Status: Full code, discussed with spouse on admission Family Communication: Spouse at bedside Disposition Plan: From home, dispo pending clinical progress Consults called: Neurology Severity of Illness: The appropriate patient status for this patient is INPATIENT. Inpatient status is judged to be reasonable and necessary in order to provide the required intensity of service to ensure the patient's safety. The patient's presenting symptoms, physical exam findings, and initial radiographic and laboratory data in the context of their chronic comorbidities is felt to place them at high  risk for further clinical deterioration. Furthermore, it is not anticipated that the patient will be medically stable for discharge from the hospital within 2 midnights of admission.   * I certify that at the point of admission it is my clinical judgment that the patient will require inpatient hospital care spanning beyond 2 midnights from the point of admission due to high intensity of service, high risk for further deterioration and high frequency of surveillance required.DEWAINE Jorie Blanch MD [facility]  If 7PM-7AM, please contact night-coverage [email or web address]  07/12/2024, 1:25 AM

## 2024-07-11 NOTE — ED Triage Notes (Signed)
 Pt bibgcems from home family was concerned of pt having a stroke. Pt was staring off and not following commands, speaking or moving hands and feet . Stood with ems. Pt has hx of stroke x4. Lkw 6 pm  Bp 170/104 Hr 54 Cbg 180

## 2024-07-11 NOTE — Code Documentation (Signed)
 Stroke Response Nurse Documentation Code Documentation  Tony Davis. is a 63 y.o. male arriving to [facility]  via [facility] on 07/11/24 with past medical hx of CVA, TIA, BPH, CAD, DVT, HTN, and HLD and hydrocephalus s/p VP shunt. On No antithrombotic. Code stroke was activated by ED.   Patient from home where he was LKW at 1830 and now complaining of aphasia . PT brought in from home after having episode of unresponsiveness followed by aphasia. He was reportedly able to stand and pivot with EMS and brought into ED. After arrival, he had another episode of aphasia and not moving. Code stroke called by ED.   Stroke team met pt in CT. NIHSS 10, see documentation for details and code stroke times. Pt given Ativan  in CT for seizure activity. Following Ativan  he was able to follow commands and answer questions. The following imaging was completed:  CT Head. Patient is not a candidate for IV Thrombolytic due to symptoms improving, stroke cancelled. Patient is not a candidate for IR due to no LVO suspected.   Care Plan: Code Stroke cancelled at 2239. Ativan  and Keppra  given. Continuous EEG to be placed.     Bedside handoff with ED RN.    Nat Blunt  Stroke Response RN

## 2024-07-11 NOTE — ED Provider Notes (Incomplete)
 Care of patient received from prior provider at 11:09 PM, please see their note for complete H/P and care plan.  Received handoff per ED course.  Clinical Course as of 07/11/24 2310  Sat Jul 11, 2024  2309 Stable HO DHY Hx of IC shunt AMS at 7pm Cleared up then again worsened Concern for focal szs Seen by neuro S/p keppra  Has not been taking meds  [CC]    Clinical Course User Index [CC] Jerral Meth, MD    Reassessment: ***

## 2024-07-12 ENCOUNTER — Inpatient Hospital Stay (HOSPITAL_COMMUNITY): Payer: Medicare (Managed Care)

## 2024-07-12 DIAGNOSIS — I160 Hypertensive urgency: Secondary | ICD-10-CM | POA: Diagnosis present

## 2024-07-12 DIAGNOSIS — N400 Enlarged prostate without lower urinary tract symptoms: Secondary | ICD-10-CM | POA: Diagnosis present

## 2024-07-12 DIAGNOSIS — G928 Other toxic encephalopathy: Secondary | ICD-10-CM | POA: Diagnosis present

## 2024-07-12 DIAGNOSIS — Z6832 Body mass index (BMI) 32.0-32.9, adult: Secondary | ICD-10-CM | POA: Diagnosis not present

## 2024-07-12 DIAGNOSIS — R569 Unspecified convulsions: Principal | ICD-10-CM

## 2024-07-12 DIAGNOSIS — I2510 Atherosclerotic heart disease of native coronary artery without angina pectoris: Secondary | ICD-10-CM | POA: Diagnosis present

## 2024-07-12 DIAGNOSIS — Z905 Acquired absence of kidney: Secondary | ICD-10-CM | POA: Diagnosis not present

## 2024-07-12 DIAGNOSIS — Z888 Allergy status to other drugs, medicaments and biological substances status: Secondary | ICD-10-CM | POA: Diagnosis not present

## 2024-07-12 DIAGNOSIS — G919 Hydrocephalus, unspecified: Secondary | ICD-10-CM | POA: Diagnosis present

## 2024-07-12 DIAGNOSIS — Z8249 Family history of ischemic heart disease and other diseases of the circulatory system: Secondary | ICD-10-CM | POA: Diagnosis not present

## 2024-07-12 DIAGNOSIS — R4701 Aphasia: Secondary | ICD-10-CM | POA: Diagnosis present

## 2024-07-12 DIAGNOSIS — G4089 Other seizures: Secondary | ICD-10-CM | POA: Diagnosis not present

## 2024-07-12 DIAGNOSIS — Q2112 Patent foramen ovale: Secondary | ICD-10-CM | POA: Diagnosis not present

## 2024-07-12 DIAGNOSIS — Z982 Presence of cerebrospinal fluid drainage device: Secondary | ICD-10-CM | POA: Diagnosis not present

## 2024-07-12 DIAGNOSIS — M069 Rheumatoid arthritis, unspecified: Secondary | ICD-10-CM | POA: Diagnosis present

## 2024-07-12 DIAGNOSIS — Z91041 Radiographic dye allergy status: Secondary | ICD-10-CM | POA: Diagnosis not present

## 2024-07-12 DIAGNOSIS — E785 Hyperlipidemia, unspecified: Secondary | ICD-10-CM | POA: Diagnosis present

## 2024-07-12 DIAGNOSIS — Z87891 Personal history of nicotine dependence: Secondary | ICD-10-CM | POA: Diagnosis not present

## 2024-07-12 DIAGNOSIS — R4182 Altered mental status, unspecified: Secondary | ICD-10-CM | POA: Diagnosis present

## 2024-07-12 DIAGNOSIS — Z955 Presence of coronary angioplasty implant and graft: Secondary | ICD-10-CM | POA: Diagnosis not present

## 2024-07-12 DIAGNOSIS — Z931 Gastrostomy status: Secondary | ICD-10-CM | POA: Diagnosis not present

## 2024-07-12 DIAGNOSIS — I69151 Hemiplegia and hemiparesis following nontraumatic intracerebral hemorrhage affecting right dominant side: Secondary | ICD-10-CM | POA: Diagnosis not present

## 2024-07-12 DIAGNOSIS — Z95828 Presence of other vascular implants and grafts: Secondary | ICD-10-CM | POA: Diagnosis not present

## 2024-07-12 DIAGNOSIS — I129 Hypertensive chronic kidney disease with stage 1 through stage 4 chronic kidney disease, or unspecified chronic kidney disease: Secondary | ICD-10-CM | POA: Diagnosis present

## 2024-07-12 DIAGNOSIS — N1831 Chronic kidney disease, stage 3a: Secondary | ICD-10-CM | POA: Diagnosis present

## 2024-07-12 DIAGNOSIS — F0150 Vascular dementia without behavioral disturbance: Secondary | ICD-10-CM | POA: Diagnosis present

## 2024-07-12 LAB — BASIC METABOLIC PANEL WITH GFR
Anion gap: 9 (ref 5–15)
BUN: 16 mg/dL (ref 6–20)
CO2: 29 mmol/L (ref 22–32)
Calcium: 8.9 mg/dL (ref 8.9–10.3)
Chloride: 104 mmol/L (ref 98–111)
Creatinine, Ser: 1.6 mg/dL — ABNORMAL HIGH (ref 0.61–1.24)
GFR, Estimated: 49 mL/min — ABNORMAL LOW (ref >60)
Glucose, Bld: 85 mg/dL (ref 70–99)
Potassium: 3.4 mmol/L — ABNORMAL LOW (ref 3.5–5.1)
Sodium: 142 mmol/L (ref 135–145)

## 2024-07-12 LAB — MAGNESIUM: Magnesium: 1.9 mg/dL (ref 1.7–2.4)

## 2024-07-12 LAB — CBC
HCT: 43.7 % (ref 39.0–52.0)
Hemoglobin: 13.8 g/dL (ref 13.0–17.0)
MCH: 30.3 pg (ref 26.0–34.0)
MCHC: 31.6 g/dL (ref 30.0–36.0)
MCV: 95.8 fL (ref 80.0–100.0)
Platelets: 233 K/uL (ref 150–400)
RBC: 4.56 MIL/uL (ref 4.22–5.81)
RDW: 12.9 % (ref 11.5–15.5)
WBC: 6.9 K/uL (ref 4.0–10.5)
nRBC: 0 % (ref 0.0–0.2)

## 2024-07-12 MED ORDER — POTASSIUM CHLORIDE 20 MEQ PO PACK
40.0000 meq | PACK | Freq: Two times a day (BID) | ORAL | Status: AC
  Administered 2024-07-12 (×2): 40 meq via ORAL
  Filled 2024-07-12 (×2): qty 2

## 2024-07-12 MED ORDER — MEMANTINE HCL 10 MG PO TABS
10.0000 mg | ORAL_TABLET | Freq: Two times a day (BID) | ORAL | Status: DC
  Administered 2024-07-12 – 2024-07-15 (×7): 10 mg via ORAL
  Filled 2024-07-12 (×7): qty 1

## 2024-07-12 MED ORDER — ONDANSETRON HCL 4 MG/2ML IJ SOLN: 4.0000 mg | Freq: Four times a day (QID) | INTRAMUSCULAR | Status: DC | PRN

## 2024-07-12 MED ORDER — METOPROLOL SUCCINATE ER 100 MG PO TB24
100.0000 mg | ORAL_TABLET | Freq: Two times a day (BID) | ORAL | Status: DC
  Administered 2024-07-12 – 2024-07-15 (×7): 100 mg via ORAL
  Filled 2024-07-12 (×7): qty 1

## 2024-07-12 MED ORDER — HYDRALAZINE HCL 50 MG PO TABS
100.0000 mg | ORAL_TABLET | Freq: Two times a day (BID) | ORAL | Status: DC
  Administered 2024-07-12 – 2024-07-15 (×7): 100 mg via ORAL
  Filled 2024-07-12 (×7): qty 2

## 2024-07-12 MED ORDER — ACETAMINOPHEN 650 MG RE SUPP: 650.0000 mg | RECTAL | Status: DC | PRN

## 2024-07-12 MED ORDER — ATORVASTATIN CALCIUM 80 MG PO TABS
80.0000 mg | ORAL_TABLET | Freq: Every day | ORAL | Status: DC
  Administered 2024-07-12 – 2024-07-15 (×4): 80 mg via ORAL
  Filled 2024-07-12 (×5): qty 1

## 2024-07-12 MED ORDER — HYDRALAZINE HCL 20 MG/ML IJ SOLN
10.0000 mg | INTRAMUSCULAR | Status: DC | PRN
  Administered 2024-07-12 (×2): 10 mg via INTRAVENOUS
  Filled 2024-07-12 (×2): qty 1

## 2024-07-12 MED ORDER — LORAZEPAM 2 MG/ML IJ SOLN: 2.0000 mg | INTRAMUSCULAR | Status: DC | PRN

## 2024-07-12 MED ORDER — SENNOSIDES-DOCUSATE SODIUM 8.6-50 MG PO TABS: 1.0000 | ORAL_TABLET | Freq: Every evening | ORAL | Status: DC | PRN

## 2024-07-12 MED ORDER — ACETAMINOPHEN 325 MG PO TABS
650.0000 mg | ORAL_TABLET | ORAL | Status: DC | PRN
  Administered 2024-07-13: 650 mg via ORAL
  Filled 2024-07-12 (×2): qty 2

## 2024-07-12 MED ORDER — LEVETIRACETAM 500 MG PO TABS
500.0000 mg | ORAL_TABLET | Freq: Two times a day (BID) | ORAL | Status: DC
  Administered 2024-07-12 – 2024-07-15 (×7): 500 mg via ORAL
  Filled 2024-07-12 (×7): qty 1

## 2024-07-12 MED ORDER — PANTOPRAZOLE SODIUM 40 MG PO TBEC
40.0000 mg | DELAYED_RELEASE_TABLET | Freq: Every day | ORAL | Status: DC
  Administered 2024-07-12 – 2024-07-15 (×4): 40 mg via ORAL
  Filled 2024-07-12 (×4): qty 1

## 2024-07-12 MED ORDER — RIVASTIGMINE TARTRATE 1.5 MG PO CAPS
6.0000 mg | ORAL_CAPSULE | Freq: Two times a day (BID) | ORAL | Status: DC
  Administered 2024-07-12 – 2024-07-15 (×7): 6 mg via ORAL
  Filled 2024-07-12 (×8): qty 4

## 2024-07-12 MED ORDER — ONDANSETRON HCL 4 MG PO TABS: 4.0000 mg | ORAL_TABLET | Freq: Four times a day (QID) | ORAL | Status: DC | PRN

## 2024-07-12 NOTE — Progress Notes (Signed)
 NEUROLOGY CONSULT FOLLOW UP NOTE   Date of service: July 12, 2024 Patient Name: Tony Davis. MRN:  [id number] DOB:  09-07-1961  Interval Hx/subjective   On exam, patient was initially difficult to arouse, but once awake was alert and oriented. MRI pending.   Vitals   Vitals:   07/12/24 0500 07/12/24 0641 07/12/24 0645 07/12/24 0729  BP: (!) 177/115 (!) 190/119 (!) 190/119 (!) 157/112  Pulse: 61 68    Resp: 15 16  16   Temp:    97.7 F (36.5 C)  TempSrc:    Axillary  SpO2: 100% 98%  99%  Weight:      Height:         Body mass index is 32.55 kg/m.  Physical Exam   Constitutional: Appears well-developed and well-nourished.  Cardiovascular: Normal rate and regular rhythm.  Respiratory: Effort normal, non-labored breathing.   Neurologic Examination   Neuro: Mental Status: Patient is awake, alert, oriented, somewhat repetitive.  Cranial Nerves: II: Blink to threat bilaterally. Pupils are equal, round, and reactive to light.   III,IV, VI: EOMI without ptosis or diploplia.  V: Facial sensation is symmetric to temperature VII: Facial movement is symmetric.  VIII: hearing is intact to voice X: Uvula elevates symmetrically XII: tongue is midline  Motor: No focal weakness.  5/5 in all extremities.  Sensory: Subjectively symmetric throughout Cerebellar: FNF and HKS are intact bilaterally  Medications  Current Facility-Administered Medications:    acetaminophen  (TYLENOL ) tablet 650 mg, 650 mg, Oral, Q4H PRN **OR** acetaminophen  (TYLENOL ) suppository 650 mg, 650 mg, Rectal, Q4H PRN, Tobie, Vishal R, MD   atorvastatin  (LIPITOR ) tablet 80 mg, 80 mg, Oral, Daily, Patel, Vishal R, MD, 80 mg at 07/12/24 9093   hydrALAZINE  (APRESOLINE ) injection 10 mg, 10 mg, Intravenous, Q4H PRN, Patel, Vishal R, MD, 10 mg at 07/12/24 0645   hydrALAZINE  (APRESOLINE ) tablet 100 mg, 100 mg, Oral, BID, Patel, Vishal R, MD, 100 mg at 07/12/24 9093   levETIRAcetam  (KEPPRA ) tablet 500 mg,  500 mg, Oral, BID, Michaela Aisha SQUIBB, MD, 500 mg at 07/12/24 9093   LORazepam  (ATIVAN ) injection 2 mg, 2 mg, Intravenous, Q5 Min x 2 PRN, Tobie Jorie SAUNDERS, MD   memantine  (NAMENDA ) tablet 10 mg, 10 mg, Oral, BID, Patel, Vishal R, MD, 10 mg at 07/12/24 9093   metoprolol  succinate (TOPROL -XL) 24 hr tablet 100 mg, 100 mg, Oral, BID, Patel, Vishal R, MD, 100 mg at 07/12/24 9093   ondansetron  (ZOFRAN ) tablet 4 mg, 4 mg, Oral, Q6H PRN **OR** ondansetron  (ZOFRAN ) injection 4 mg, 4 mg, Intravenous, Q6H PRN, Tobie, Vishal R, MD   pantoprazole  (PROTONIX ) EC tablet 40 mg, 40 mg, Oral, Daily, Patel, Vishal R, MD, 40 mg at 07/12/24 9093   potassium chloride  (KLOR-CON ) packet 40 mEq, 40 mEq, Oral, BID, Odell Celinda Balo, MD, 40 mEq at 07/12/24 9094   rivastigmine  (EXELON ) capsule 6 mg, 6 mg, Oral, BID, Patel, Vishal R, MD   senna-docusate (Senokot-S) tablet 1 tablet, 1 tablet, Oral, QHS PRN, Tobie Jorie SAUNDERS, MD  Labs and Diagnostic Imaging   CBC:  Recent Labs  Lab 07/11/24 2217 07/11/24 2220 07/12/24 0433  WBC 6.8  --  6.9  NEUTROABS 4.2  --   --   HGB 14.7 15.0 13.8  HCT 45.4 44.0 43.7  MCV 95.4  --  95.8  PLT 237  --  233    Basic Metabolic Panel:  Lab Results  Component Value Date   NA 142 07/12/2024   K  3.4 (L) 07/12/2024   CO2 29 07/12/2024   GLUCOSE 85 07/12/2024   BUN 16 07/12/2024   CREATININE 1.60 (H) 07/12/2024   CALCIUM  8.9 07/12/2024   GFRNONAA 49 (L) 07/12/2024   GFRAA >60 09/21/2018   Lipid Panel:  Lab Results  Component Value Date   LDLCALC 29 09/21/2023   HgbA1c:  Lab Results  Component Value Date   HGBA1C 5.8 (H) 09/21/2023   Urine Drug Screen:     Component Value Date/Time   LABOPIA NONE DETECTED 09/21/2023 0005   COCAINSCRNUR NONE DETECTED 09/21/2023 0005   LABBENZ NONE DETECTED 09/21/2023 0005   AMPHETMU NONE DETECTED 09/21/2023 0005   THCU NONE DETECTED 09/21/2023 0005   LABBARB NONE DETECTED 09/21/2023 0005    Alcohol Level     Component  Value Date/Time   ETH <15 07/11/2024 2209   INR  Lab Results  Component Value Date   INR 1.0 07/11/2024   APTT  Lab Results  Component Value Date   APTT 30 07/11/2024   AED levels: No results found for: PHENYTOIN, ZONISAMIDE, LAMOTRIGINE, LEVETIRACETA  CT Head without contrast(Personally reviewed): Negative acute, he does have chronic changes associate with his previous strokes and a VP shunt in place   CT angio Head and Neck with contrast(Personally reviewed): Negative  MRI Brain(Personally reviewed): PENDING  LTM EEG 8/2 2334 to 8/3 0730:  This study is suggestive of moderate diffuse encephalopathy, nonspecific etiology but likely related to sedation, toxic-metabolic etiology. No seizures or epileptiform discharges were seen throughout the recording.   Assessment   Tony Davis. is a 63 y.o. male who presented 8/2 with persistent confusion after an episode concerning for partial seizure, which improved after Ativan  administration.   LTM did not show seizures, but we will keep it on so that we have at least 24 hours of monitoring.   Recommendations   - continue seizure precautions - continue Keppra  500mg  BID - continue LTM EEG, to be on for at least 24 hours ______________________________________________________________________   Signed, Rocky JAYSON Likes, NP [facility]  NEUROHOSPITALIST ADDENDUM Performed a face to face diagnostic evaluation.   I have reviewed the contents of history and physical exam as documented by PA/ARNP/Resident and agree with above documentation.  I have discussed and formulated the above plan as documented. Edits to the note have been made as needed.  [name], MD [facility] [phone]   If 7pm to 7am, please call on call as listed on AMION.

## 2024-07-12 NOTE — Plan of Care (Signed)

## 2024-07-12 NOTE — Progress Notes (Signed)
LTM maint complete - no skin breakdown under:  Fp1, Fp2

## 2024-07-12 NOTE — Progress Notes (Signed)
LTM EEG hooked up and running - no initial skin breakdown. 

## 2024-07-12 NOTE — Progress Notes (Signed)
 [facility] PROGRESS NOTE    Progress Note  Tony Davis.  FMW:[id number] DOB: June 27, 1961 DOA: 07/11/2024 PCP: [name], DO     Brief Narrative:   Tony Davis. is an 63 y.o. male past medical history significant for basal ganglia and right cerebellar hemorrhagic stroke last in 2024 with right residual deficits, hydrocephalus status post VP shunt in 2015, CAD status post stent 2011, essential hypertension history of bilateral DVT status post IVC filter, chronic AC stage III a, clear-cell papillary carcinoma status post nephrectomy in 2020 for vascular dementia brought into the ED for unresponsiveness and aphasia, and a postictal state   Assessment/Plan:   Seizure activity: Sudden onset of aphasia and postictal in the ED. CT of the head was negative for any acute finding. Neurology was consulted. Started on EEG.  He was loaded with Keppra . UDS pending Electrolytes showed potassium greater than 4 magnesium is 1.9.  Give oral potassium recheck in the morning MRI pending  History of hemorrhagic stroke with residual hemiparesis  with a VP shunt CT of the head showed no acute findings.  Chronic kidney disease stage III yea/clear-cell papillary renal carcinoma: With a history of nephrectomy. Renal function appears to be stable.  CAD with a history of DES to the circumflex in 2011: Continue statins.  Essential hypertension: Continue metoprolol  and hydralazine .  Hyperlipidemia: Continue statins.  Vascular dementia: Continue Namenda  and Exelon . Haldol as needed, he is at high risk of acute confusional state   DVT prophylaxis: lovenox  Family Communication:noen Status is: Inpatient Remains inpatient appropriate because: Seizure activity    Code Status:     Code Status Orders  (From admission, onward)           Start     Ordered   07/12/24 0108  Full code  Continuous       Question:  By:  Answer:  Consent: discussion documented in EHR    07/12/24 0109           Code Status History     Date Active Date Inactive Code Status Order ID Comments User Context   09/21/2023 1148 09/26/2023 1903 Do not attempt resuscitation (DNR) PRE-ARREST INTERVENTIONS DESIRED [id number]  Dannette Stephane HERO, NP Inpatient   09/19/2023 2328 09/21/2023 1148 Full Code [id number]  Jerrie Lola CROME, MD ED   01/18/2023 1354 01/21/2023 1723 Full Code [id number]  Sherrel Franky BRAVO, MD Inpatient   09/20/2018 2102 09/21/2018 1747 Full Code [id number]  Luke Agent, MD ED   11/29/2015 0143 11/30/2015 1748 Full Code [id number]  Franky Redia SAILOR, MD Inpatient   11/30/2014 2112 12/02/2014 2116 Full Code [id number]  Remonia Alm JINNY, MD Inpatient   07/30/2014 2056 09/07/2014 1640 Full Code [id number]  Maurice Sharlet RAMAN, PA-C Inpatient   07/08/2014 1430 07/30/2014 2056 Full Code [id number]  Myrna Shields Inpatient   04/15/2014 0302 04/16/2014 1827 Full Code [id number]  Alm Maxwell LABOR, MD Inpatient   09/24/2013 1953 09/27/2013 1138 Full Code [id number]  Harlow Ozell BRAVO Inpatient   03/19/2012 1541 03/24/2012 1439 Full Code [id number]  Verlin Lonni BIRCH, MD Inpatient         IV Access:   Peripheral IV   Procedures and diagnostic studies:   CT HEAD CODE STROKE WO CONTRAST Result Date: 07/11/2024 EXAM: CT HEAD WITHOUT CONTRAST 07/11/2024 10:27:16 PM TECHNIQUE: CT of the head was performed without the administration of intravenous contrast. Automated exposure control, iterative reconstruction, and/or weight based adjustment of the mA/kV was utilized to reduce the radiation  dose to as low as reasonably achievable. COMPARISON: 09/21/2023 CLINICAL HISTORY: Neuro deficit, acute, stroke suspected. FINDINGS: BRAIN AND VENTRICLES: No acute hemorrhage. ASPECTS is 10. Size and configuration of the ventricles are unchanged. Right frontal approach shunt catheter with tip near the right foramen of [place]. ORBITS: No acute abnormality. SINUSES: No acute abnormality. SOFT TISSUES AND SKULL: No acute  soft tissue abnormality. No skull fracture. IMPRESSION: 1. No acute intracranial abnormality. 2. Right frontal approach shunt catheter with tip near the right foramen of [place]. Unchanged size and configuration of the ventricles. 3. ASPECTS is 10 4. Findings communicated to Dr. Michaela at 10:31 pm Electronically signed by: Franky Stanford MD 07/11/2024 10:35 PM EDT RP Workstation: [id number]     Medical Consultants:   None.   Subjective:    Tony Davis. no complaints  Objective:    Vitals:   07/12/24 0300 07/12/24 0400 07/12/24 0434 07/12/24 0500  BP: (!) 155/105 (!) 189/101  (!) 177/115  Pulse: 64 62  61  Resp: 14 15  15   Temp:   97.7 F (36.5 C)   TempSrc:   Axillary   SpO2: 100% 100%  100%  Weight:      Height:       SpO2: 100 %  No intake or output data in the 24 hours ending 07/12/24 0637 Filed Weights   07/11/24 2211  Weight: 108.9 kg    Exam: General exam: In no acute distress. Respiratory system: Good air movement and clear to auscultation. Cardiovascular system: S1 & S2 heard, RRR. No JVD. Gastrointestinal system: Abdomen is nondistended, soft and nontender.  Extremities: No pedal edema. Skin: No rashes, lesions or ulcers Psychiatry: Judgement and insight appear normal. Mood & affect appropriate.    Data Reviewed:    Labs: Basic Metabolic Panel: Recent Labs  Lab 07/11/24 2217 07/11/24 2220 07/12/24 0433  NA 142 143 142  K 3.8 3.6 3.4*  CL 104 103 104  CO2 29  --  29  GLUCOSE 124* 124* 85  BUN 18 19 16   CREATININE 1.62* 1.70* 1.60*  CALCIUM  9.2  --  8.9  MG  --   --  1.9   GFR Estimated Creatinine Clearance: 62.6 mL/min (A) (by C-G formula based on SCr of 1.6 mg/dL (H)). Liver Function Tests: Recent Labs  Lab 07/11/24 2217  AST 32  ALT 24  ALKPHOS 102  BILITOT 0.9  PROT 7.0  ALBUMIN  3.8   No results for input(s): LIPASE, AMYLASE in the last 168 hours. No results for input(s): AMMONIA in the last 168  hours. Coagulation profile Recent Labs  Lab 07/11/24 2217  INR 1.0   COVID-19 Labs  No results for input(s): DDIMER, FERRITIN, LDH, CRP in the last 72 hours.  No results found for: SARSCOV2NAA  CBC: Recent Labs  Lab 07/11/24 2217 07/11/24 2220 07/12/24 0433  WBC 6.8  --  6.9  NEUTROABS 4.2  --   --   HGB 14.7 15.0 13.8  HCT 45.4 44.0 43.7  MCV 95.4  --  95.8  PLT 237  --  233   Cardiac Enzymes: No results for input(s): CKTOTAL, CKMB, CKMBINDEX, TROPONINI in the last 168 hours. BNP (last 3 results) No results for input(s): PROBNP in the last 8760 hours. CBG: Recent Labs  Lab 07/11/24 2208  GLUCAP 122*   D-Dimer: No results for input(s): DDIMER in the last 72 hours. Hgb A1c: No results for input(s): HGBA1C in the last 72 hours. Lipid Profile: No results for  input(s): CHOL, HDL, LDLCALC, TRIG, CHOLHDL, LDLDIRECT in the last 72 hours. Thyroid  function studies: No results for input(s): TSH, T4TOTAL, T3FREE, THYROIDAB in the last 72 hours.  Invalid input(s): FREET3 Anemia work up: No results for input(s): VITAMINB12, FOLATE, FERRITIN, TIBC, IRON , RETICCTPCT in the last 72 hours. Sepsis Labs: Recent Labs  Lab 07/11/24 2217 07/12/24 0433  WBC 6.8 6.9   Microbiology No results found for this or any previous visit (from the past 240 hours).   Medications:    atorvastatin   80 mg Oral Daily   hydrALAZINE   100 mg Oral BID   levETIRAcetam   500 mg Oral BID   memantine   10 mg Oral BID   metoprolol  succinate  100 mg Oral BID   pantoprazole   40 mg Oral Daily   rivastigmine   6 mg Oral BID   Continuous Infusions:    LOS: 0 days   Erle Odell Castor  [facility]  07/12/2024, 6:37 AM

## 2024-07-12 NOTE — ED Notes (Signed)
Provided pt with Urinal

## 2024-07-12 NOTE — Hospital Course (Signed)
 Tony Shives. is a 63 y.o. 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

## 2024-07-12 NOTE — Consult Note (Signed)
 NEUROLOGY CONSULT NOTE   Date of service: July 12, 2024 Patient Name: Tony Davis. MRN:  [id number] DOB:  [birth year] Chief Complaint: Behavioral arrest Requesting Provider: Tobie Jorie SAUNDERS, MD  History of Present Illness  Tony Davis. is a 63 y.o. male with hx of previous ICH, hydrocephalus status post VP shunt, vascular dementia, history of DVT, who presented to the emergency department for an episode of unresponsiveness.  He was last in his normal state of health around 6:30 PM, but appeared less interactive after dinner.  EMS was activated and on arrival, the patient was unresponsive but quickly began speaking again.  In the emergency department, he was initially speaking to the ED provider, but then went completely unresponsive with fixed gaze deviation to the left.  A code stroke was activated at that time and response to the change.  On my evaluation, he had left fixed gaze with nystagmus.  He did not seem aphasic as much as suffering from behavioral arrest.  He had waxy flexibility in both arms and would not follow commands or speak.  Of note, the patient does tell me that he has a history of seizures, but has not been on seizure medicine for quite some time.  It has been multiple years since his previous seizure per patient.  LKW: 6:30 PM IV Thrombolysis: No, previous ICH  NIHSS components Score: Comment  1a Level of Conscious 0[]  1[x]  2[]  3[]      1b LOC Questions 0[]  1[]  2[x]       1c LOC Commands 0[x]  1[]  2[]       2 Best Gaze 0[x]  1[]  2[]       3 Visual 0[x]  1[]  2[]  3[]      4 Facial Palsy 0[x]  1[]  2[]  3[]      5a Motor Arm - left 0[x]  1[]  2[]  3[]  4[]  UN[]    5b Motor Arm - Right 0[x]  1[]  2[]  3[]  4[]  UN[]    6a Motor Leg - Left 0[]  1[]  2[]  3[x]  4[]  UN[]    6b Motor Leg - Right 0[]  1[]  2[]  3[x]  4[]  UN[]    7 Limb Ataxia 0[x]  1[]  2[]  UN[]      8 Sensory 0[]  1[]  2[]  UN[]      9 Best Language 0[]  1[x]  2[]  3[]      10 Dysarthria 0[x]  1[]  2[]  UN[]      11 Extinct. and  Inattention 0[x]  1[]  2[]       TOTAL: 10        Past History   Past Medical History:  Diagnosis Date   Acute respiratory failure (HCC) 2015   a. Following BG hemorrhage - s/p trache with decannulation.   Basal ganglia hemorrhage (HCC) 2015   a. 2015 - with intraventricular extension and hydrocephalus requiring right crani and VP shunt, complicated by encephalopathy, protein calorie malnutrition, acute resp failure req trache and PEG, BLE DVT s/p IVC, enterobacter PNA, subsequent right hemiparesis and difficulty communicating.   BPH (benign prostatic hyperplasia)    CAD (coronary artery disease)    a. s/p promus DES circumflex artery 10/13/10. b. Cath 04/2013: stable disease but possible small vessel disease -Imdur  added.   Carotid artery disease (HCC)    a. 1-29% BICA by study 04/2014.   CVA (cerebral vascular accident) (HCC) 02/2010   a. CVA 2011, prior reported TIAs. b. CNS hemorrhage 2015.   CVA (cerebral vascular accident) (HCC) 06/2014   hemorrhagic; in [place] ; brain activity diminshed; ability to do anything; can't do ADLs on own   DVT (deep venous thrombosis) (HCC)  2013   a. 2013. b. 2015 - reported BLE DVT s/p IVC filter.    Enterobacter cloacae pneumonia (HCC) 2015   Falls    Folate deficiency anemia    GERD (gastroesophageal reflux disease)    Hematuria 07/2011   History of kidney stones    HLD (hyperlipidemia)    HTN (hypertension)    Hydrocephalus (HCC) 2015   a. 2/2 BG hemorrhage.   Incontinence of feces    Lower extremity edema    LV dysfunction    a. 2011 - EF 45-50%, subsequent 55-60% in 04/2014.   Noncompliance    Orthostatic hypotension    OSA on CPAP    not using CPAP right now (11/30/2014)   PFO (patent foramen ovale)    a. echo 8/12: EF 55-65%, grade 2 diast dysfnx; +PFO on bubble study. b. TEE 4/13: EF normal, atrial septum with suspicion for interatrial septum fenestrations without flow across and few large bubbles noted in LA.  This was not felt  to require closure    Protein calorie malnutrition (HCC)    Rheumatoid arthritis(714.0)    S/P percutaneous endoscopic gastrostomy (PEG) tube placement (HCC) 2015   TIA (transient ischemic attack)    1-2; both after the one in 02/2010 (09/25/2013)   Urinary incontinence     Past Surgical History:  Procedure Laterality Date   CORONARY ANGIOPLASTY WITH STENT PLACEMENT  12/10/2010   1 (09/24/2013)   CYSTOSCOPY/URETEROSCOPY/HOLMIUM LASER/STENT PLACEMENT Right 07/18/2023   Procedure: CYSTOSCOPY RIGHT URETEROSCOPY, HOLMIUM LASER LITHOTRIPSY, AND RIGHT URETERAL STENT PLACEMENT;  Surgeon: Cam Morene ORN, MD;  Location: [facility];  Service: Urology;  Laterality: Right;  90 MINUTES   filter in place for clots      per wife at preop on 01/11/23   KNEE ARTHROSCOPY Bilateral 08/11/1999   2 on left and 3 on rt   LAPAROSCOPIC NEPHRECTOMY Left 01/18/2023   Procedure: LAPAROSCOPIC RADICAL LEFT NEPHRECTOMY;  Surgeon: Cam Morene ORN, MD;  Location: [facility];  Service: Urology;  Laterality: Left;  210 MINUTES NEDDED FOR CASE   LEFT HEART CATHETERIZATION WITH CORONARY ANGIOGRAM N/A 04/24/2013   Procedure: LEFT HEART CATHETERIZATION WITH CORONARY ANGIOGRAM;  Surgeon: Lonni JONETTA Cash, MD;  Location: [facility] CATH LAB;  Service: Cardiovascular;  Laterality: N/A;   shunt in head      TEE WITHOUT CARDIOVERSION  03/21/2012   Procedure: TRANSESOPHAGEAL ECHOCARDIOGRAM (TEE);  Surgeon: Vina LULLA Gull, MD;  Location: [facility] ENDOSCOPY;  Service: Cardiovascular;  Laterality: N/A;    Family History: Family History  Problem Relation Age of Onset   Heart attack Father    Stroke Father    Heart attack Sister    Hypertension Brother    Seizures Sister    Stroke Sister    Cancer Neg Hx    Kidney disease Neg Hx    Diabetes Neg Hx     Social History  reports that he quit smoking about 12 years ago. His smoking use included cigarettes. He started smoking about 47 years ago. He has a 35 pack-year smoking history. He  has never used smokeless tobacco. He reports that he does not drink alcohol and does not use drugs.  Allergies  Allergen Reactions   Iodine Anaphylaxis   Norvasc  [Amlodipine ] Swelling and Other (See Comments)    Foot edema   Aricept [Donepezil] Other (See Comments)    Hallucinations    Medications   Current Facility-Administered Medications:    acetaminophen  (TYLENOL ) tablet 650 mg, 650 mg, Oral, Q4H PRN **  OR** acetaminophen  (TYLENOL ) suppository 650 mg, 650 mg, Rectal, Q4H PRN, Tobie, Vishal R, MD   atorvastatin  (LIPITOR ) tablet 80 mg, 80 mg, Oral, Daily, Patel, Vishal R, MD   hydrALAZINE  (APRESOLINE ) injection 10 mg, 10 mg, Intravenous, Q4H PRN, Patel, Vishal R, MD, 10 mg at 07/12/24 0136   hydrALAZINE  (APRESOLINE ) tablet 100 mg, 100 mg, Oral, BID, Patel, Vishal R, MD   LORazepam  (ATIVAN ) injection 2 mg, 2 mg, Intravenous, Q5 Min x 2 PRN, Tobie Jorie SAUNDERS, MD   memantine  (NAMENDA ) tablet 10 mg, 10 mg, Oral, BID, Patel, Vishal R, MD   metoprolol  succinate (TOPROL -XL) 24 hr tablet 100 mg, 100 mg, Oral, BID, Patel, Vishal R, MD   ondansetron  (ZOFRAN ) tablet 4 mg, 4 mg, Oral, Q6H PRN **OR** ondansetron  (ZOFRAN ) injection 4 mg, 4 mg, Intravenous, Q6H PRN, Tobie Jorie SAUNDERS, MD   pantoprazole  (PROTONIX ) EC tablet 40 mg, 40 mg, Oral, Daily, Patel, Vishal R, MD   rivastigmine  (EXELON ) capsule 6 mg, 6 mg, Oral, BID, Patel, Vishal R, MD   senna-docusate (Senokot-S) tablet 1 tablet, 1 tablet, Oral, QHS PRN, Tobie Jorie SAUNDERS, MD  Current Outpatient Medications:    acetaminophen  (TYLENOL ) 325 MG tablet, Take 650 mg by mouth every 6 (six) hours as needed for moderate pain., Disp: , Rfl:    atorvastatin  (LIPITOR ) 80 MG tablet, TAKE ONE TABLET BY MOUTH DAILY, Disp: 90 tablet, Rfl: 0   Cholecalciferol (VITAMIN D3) 25 MCG (1000 UT) CAPS, Take 1,000 Units by mouth daily., Disp: , Rfl:    escitalopram  (LEXAPRO ) 20 MG tablet, Take 20 mg by mouth daily., Disp: , Rfl:    hydrALAZINE  (APRESOLINE ) 100 MG tablet,  Take 100 mg by mouth 2 (two) times daily., Disp: , Rfl:    melatonin 3 MG TABS tablet, Take 3 mg by mouth at bedtime., Disp: , Rfl:    memantine  (NAMENDA ) 10 MG tablet, Take 10 mg by mouth 2 (two) times daily., Disp: , Rfl:    metoprolol  succinate (TOPROL -XL) 100 MG 24 hr tablet, Take 1 tablet (100 mg total) by mouth 2 (two) times daily. Take with or immediately following a meal., Disp: 180 tablet, Rfl: 3   pantoprazole  (PROTONIX ) 40 MG tablet, Take 1 tablet (40 mg total) by mouth daily., Disp: , Rfl:    rivastigmine  (EXELON ) 6 MG capsule, Take 6 mg by mouth 2 (two) times daily., Disp: , Rfl:    polyethylene glycol (MIRALAX / GLYCOLAX) 17 g packet, Take 17 g by mouth 2 (two) times daily as needed for moderate constipation., Disp: , Rfl:   Vitals   Vitals:   07/12/24 0030 07/12/24 0115 07/12/24 0130 07/12/24 0150  BP: (!) 167/104 (!) 188/114 (!) 162/126 (!) 167/103  Pulse: 60 62 61 63  Resp: 14 14 14 15   Temp:      TempSrc:      SpO2: 98% 97% 98% 100%  Weight:      Height:        Body mass index is 32.55 kg/m.   Physical Exam   Constitutional: Appears well-developed and well-nourished.  Neurologic Examination    Neuro: Mental Status: Patient is awake, alert, he has paucity of speech, but if stimulated he will answer questions with relatively fluent speech.  He is not oriented. Cranial Nerves: II: Blink to threat bilaterally. Pupils are equal, round, and reactive to light.   III,IV, VI: EOMI without ptosis or diploplia.  V: Facial sensation is symmetric to temperature VII: Facial movement is symmetric.  VIII: hearing is  intact to voice X: Uvula elevates symmetrically XII: tongue is midline without atrophy or fasciculations.  Motor: Strength is symmetric, he is able to hold both arms against gravity without drift, he makes poor effort in bilateral lower extremities and therefore does not resist gravity Sensory: He response to noxious stimulation in all four  extremities Cerebellar: FNF and HKS are intact bilaterally    Labs/Imaging/Neurodiagnostic studies   CBC:  Recent Labs  Lab 2024-07-20 2217 07/20/24 2220  WBC 6.8  --   NEUTROABS 4.2  --   HGB 14.7 15.0  HCT 45.4 44.0  MCV 95.4  --   PLT 237  --    Basic Metabolic Panel:  Lab Results  Component Value Date   NA 143 July 20, 2024   K 3.6 07/20/24   CO2 29 07/20/24   GLUCOSE 124 (H) 2024-07-20   BUN 19 July 20, 2024   CREATININE 1.70 (H) 07/20/24   CALCIUM  9.2 20-Jul-2024   GFRNONAA 48 (L) 07-20-2024   GFRAA >60 09/21/2018   Lipid Panel:  Lab Results  Component Value Date   LDLCALC 29 09/21/2023   HgbA1c:  Lab Results  Component Value Date   HGBA1C 5.8 (H) 09/21/2023   Urine Drug Screen:     Component Value Date/Time   LABOPIA NONE DETECTED 09/21/2023 0005   COCAINSCRNUR NONE DETECTED 09/21/2023 0005   LABBENZ NONE DETECTED 09/21/2023 0005   AMPHETMU NONE DETECTED 09/21/2023 0005   THCU NONE DETECTED 09/21/2023 0005   LABBARB NONE DETECTED 09/21/2023 0005    Alcohol Level     Component Value Date/Time   ETH <15 07/20/24 2209   INR  Lab Results  Component Value Date   INR 1.0 20-Jul-2024   APTT  Lab Results  Component Value Date   APTT 30 07-20-24   CT Head without contrast(Personally reviewed): Negative acute, he does have chronic changes associate with his previous strokes and a VP shunt in place  CT angio Head and Neck with contrast(Personally reviewed): Negative   ASSESSMENT   Tony Davis. is a 63 y.o. male with persistent confusion following an episode highly concerning for partial seizure that improved following Ativan .  I have discussed with our EEG tech and we will get him connected to continuous monitoring stat.(This is started the time of finalizing this note, with no ongoing seizure activity on initial review).  I would continue to monitor, and continue Keppra  from urine out.  RECOMMENDATIONS  Check magnesium,  electrolytes UDS Start Keppra  500 mg twice daily after 2 g load LTM EEG MRI brain once able ______________________________________________________________________    Signed, Aisha Seals, MD [facility]

## 2024-07-12 NOTE — Procedures (Signed)
 Patient Name: Tony Davis.  MRN: [id number]  Epilepsy Attending: Pastor Falling  Referring Physician/Provider: No ref. provider found      Date: 07/12/2024 Start time: 07/11/2024 at 2334 End Time: 07/12/2024 at 0730 Duration: 8 hours and 4 minutes   Patient history: 63 y.o. male with hx of previous ICH, hydrocephalus status post VP shunt, vascular dementia, history of DVT, who presented to the emergency department for an episode of unresponsiveness. EEG to evaluate for seizure   Level of alertness: lethargic   AEDs during EEG study: LEV  Technical aspects: This EEG study was done with scalp electrodes positioned according to the 10-20 International system of electrode placement. Electrical activity was reviewed with band pass filter of 1-70Hz , sensitivity of 7 uV/mm, display speed of 9mm/sec with a 60Hz  notched filter applied as appropriate. EEG data were recorded continuously and digitally stored.  Video monitoring was available and reviewed as appropriate.  Description: EEG showed continuous/intermittent generalized delta-theta generalized slowing. Hyperventilation and photic stimulation were not performed.     ABNORMALITY - Continuous slow, generalized   IMPRESSION: This study is suggestive of moderate diffuse encephalopathy, nonspecific etiology but likely related to sedation, toxic-metabolic etiology. No seizures or epileptiform discharges were seen throughout the recording.   Pastor Falling MD Neurology

## 2024-07-13 ENCOUNTER — Encounter (HOSPITAL_COMMUNITY): Payer: Self-pay | Admitting: Internal Medicine

## 2024-07-13 ENCOUNTER — Inpatient Hospital Stay (HOSPITAL_COMMUNITY): Payer: Medicare (Managed Care)

## 2024-07-13 DIAGNOSIS — F0150 Vascular dementia without behavioral disturbance: Secondary | ICD-10-CM | POA: Diagnosis not present

## 2024-07-13 DIAGNOSIS — R569 Unspecified convulsions: Secondary | ICD-10-CM | POA: Diagnosis not present

## 2024-07-13 DIAGNOSIS — Z8679 Personal history of other diseases of the circulatory system: Secondary | ICD-10-CM

## 2024-07-13 DIAGNOSIS — Z982 Presence of cerebrospinal fluid drainage device: Secondary | ICD-10-CM | POA: Diagnosis not present

## 2024-07-13 DIAGNOSIS — G919 Hydrocephalus, unspecified: Secondary | ICD-10-CM

## 2024-07-13 LAB — BASIC METABOLIC PANEL WITH GFR
Anion gap: 8 (ref 5–15)
BUN: 20 mg/dL (ref 6–20)
CO2: 25 mmol/L (ref 22–32)
Calcium: 9.1 mg/dL (ref 8.9–10.3)
Chloride: 107 mmol/L (ref 98–111)
Creatinine, Ser: 1.62 mg/dL — ABNORMAL HIGH (ref 0.61–1.24)
GFR, Estimated: 48 mL/min — ABNORMAL LOW (ref >60)
Glucose, Bld: 99 mg/dL (ref 70–99)
Potassium: 3.8 mmol/L (ref 3.5–5.1)
Sodium: 140 mmol/L (ref 135–145)

## 2024-07-13 MED ORDER — LISINOPRIL 20 MG PO TABS
20.0000 mg | ORAL_TABLET | Freq: Every day | ORAL | Status: DC
  Administered 2024-07-13: 20 mg via ORAL
  Filled 2024-07-13: qty 1

## 2024-07-13 NOTE — Progress Notes (Signed)
 LTM VIDEO EEG discontinued - no skin breakdown at [facility]

## 2024-07-13 NOTE — Progress Notes (Signed)
 [facility] PROGRESS NOTE    Progress Note  Tony JINNY Charmayne Mickey.  FMW:[id number] DOB: 03-05-1961 DOA: 07/11/2024 PCP: [name], DO     Brief Narrative:   Hrishikesh Hoeg. is an 63 y.o. male past medical history significant for basal ganglia and right cerebellar hemorrhagic stroke last in 2024 with right residual deficits, hydrocephalus status post VP shunt in 2015, CAD status post stent 2011, essential hypertension history of bilateral DVT status post IVC filter, chronic AC stage III a, clear-cell papillary carcinoma status post nephrectomy in 2020 for vascular dementia brought into the ED for unresponsiveness and aphasia, and a postictal state   Assessment/Plan:   Seizure activity: Sudden onset of aphasia and postictal in the ED. CT of the head was negative for any acute finding. EEG showed generalized delta theta slowing. Continue IV Keppra  UDS pending MRI pending Further management per neurology  History of hemorrhagic stroke with residual hemiparesis  with a VP shunt CT of the head showed no acute findings.  Chronic kidney disease stage III yea/clear-cell papillary renal carcinoma: With a history of nephrectomy. Renal function appears to be stable.  CAD with a history of DES to the circumflex in 2011: Continue statins.  Essential hypertension: Continue metoprolol  and hydralazine . Blood pressure significantly elevated started on lisinopril .  Hyperlipidemia: Continue statins.  Vascular dementia: Continue Namenda  and Exelon . Haldol as needed, he is at high risk of acute confusional state   DVT prophylaxis: lovenox  Family Communication:noen Status is: Inpatient Remains inpatient appropriate because: Seizure activity    Code Status:     Code Status Orders  (From admission, onward)           Start     Ordered   07/12/24 0108  Full code  Continuous       Question:  By:  Answer:  Consent: discussion documented in EHR   07/12/24 0109            Code Status History     Date Active Date Inactive Code Status Order ID Comments User Context   09/21/2023 1148 09/26/2023 1903 Do not attempt resuscitation (DNR) PRE-ARREST INTERVENTIONS DESIRED [id number]  Dannette Stephane HERO, NP Inpatient   09/19/2023 2328 09/21/2023 1148 Full Code [id number]  Jerrie Lola CROME, MD ED   01/18/2023 1354 01/21/2023 1723 Full Code [id number]  Sherrel Franky BRAVO, MD Inpatient   09/20/2018 2102 09/21/2018 1747 Full Code [id number]  Luke Agent, MD ED   11/29/2015 0143 11/30/2015 1748 Full Code [id number]  Franky Redia SAILOR, MD Inpatient   11/30/2014 2112 12/02/2014 2116 Full Code [id number]  Remonia Alm JINNY, MD Inpatient   07/30/2014 2056 09/07/2014 1640 Full Code [id number]  Maurice Sharlet RAMAN, PA-C Inpatient   07/08/2014 1430 07/30/2014 2056 Full Code [id number]  Myrna Shields Inpatient   04/15/2014 0302 04/16/2014 1827 Full Code [id number]  Alm Maxwell LABOR, MD Inpatient   09/24/2013 1953 09/27/2013 1138 Full Code [id number]  Harlow Ozell BRAVO Inpatient   03/19/2012 1541 03/24/2012 1439 Full Code [id number]  Verlin Lonni BIRCH, MD Inpatient         IV Access:   Peripheral IV   Procedures and diagnostic studies:   Overnight EEG with video Result Date: 07/12/2024 Tony Lek, MD     07/12/2024  9:23 AM Patient Name: Tony JINNY Charmayne Mickey. MRN: [id number] Epilepsy Attending: Lek Tony Referring Physician/Provider: No ref. provider found     Date: 07/12/2024 Start time: 07/11/2024 at 2334 End Time: 07/12/2024 at 0730 Duration: 8 hours and 4  minutes Patient history: 63 y.o. male with hx of previous ICH, hydrocephalus status post VP shunt, vascular dementia, history of DVT, who presented to the emergency department for an episode of unresponsiveness. EEG to evaluate for seizure Level of alertness: lethargic AEDs during EEG study: LEV Technical aspects: This EEG study was done with scalp electrodes positioned according to the 10-20 International system of electrode placement.  Electrical activity was reviewed with band pass filter of 1-70Hz , sensitivity of 7 uV/mm, display speed of 71mm/sec with a 60Hz  notched filter applied as appropriate. EEG data were recorded continuously and digitally stored.  Video monitoring was available and reviewed as appropriate. Description: EEG showed continuous/intermittent generalized delta-theta generalized slowing. Hyperventilation and photic stimulation were not performed.   ABNORMALITY - Continuous slow, generalized IMPRESSION: This study is suggestive of moderate diffuse encephalopathy, nonspecific etiology but likely related to sedation, toxic-metabolic etiology. No seizures or epileptiform discharges were seen throughout the recording. Tony Falling MD Neurology    CT HEAD CODE STROKE WO CONTRAST Result Date: 07/11/2024 EXAM: CT HEAD WITHOUT CONTRAST 07/11/2024 10:27:16 PM TECHNIQUE: CT of the head was performed without the administration of intravenous contrast. Automated exposure control, iterative reconstruction, and/or weight based adjustment of the mA/kV was utilized to reduce the radiation dose to as low as reasonably achievable. COMPARISON: 09/21/2023 CLINICAL HISTORY: Neuro deficit, acute, stroke suspected. FINDINGS: BRAIN AND VENTRICLES: No acute hemorrhage. ASPECTS is 10. Size and configuration of the ventricles are unchanged. Right frontal approach shunt catheter with tip near the right foramen of [place]. ORBITS: No acute abnormality. SINUSES: No acute abnormality. SOFT TISSUES AND SKULL: No acute soft tissue abnormality. No skull fracture. IMPRESSION: 1. No acute intracranial abnormality. 2. Right frontal approach shunt catheter with tip near the right foramen of Monroe. Unchanged size and configuration of the ventricles. 3. ASPECTS is 10 4. Findings communicated to Dr. Michaela at 10:31 pm Electronically signed by: Franky Stanford MD 07/11/2024 10:35 PM EDT RP Workstation: [id number]     Medical Consultants:    None.   Subjective:    Tony JINNY Charmayne Mickey. awake today, no complaints  Objective:    Vitals:   07/12/24 1601 07/12/24 1958 07/12/24 2338 07/13/24 0408  BP: (!) 160/95 (!) 148/87 (!) 174/89 (!) 174/93  Pulse: 69 70 73 66  Resp: 19 18 16 18   Temp: 98.7 F (37.1 C) 98.2 F (36.8 C) 98.5 F (36.9 C) 98.6 F (37 C)  TempSrc: Oral Oral Oral Oral  SpO2: 96% 98% 99% 97%  Weight:      Height:       SpO2: 97 %   Intake/Output Summary (Last 24 hours) at 07/13/2024 0705 Last data filed at 07/13/2024 0435 Gross per 24 hour  Intake 240 ml  Output 550 ml  Net -310 ml   Filed Weights   07/11/24 2211  Weight: 108.9 kg    Exam: General exam: In no acute distress. Respiratory system: Good air movement and clear to auscultation. Cardiovascular system: S1 & S2 heard, RRR. No JVD. Gastrointestinal system: Abdomen is nondistended, soft and nontender.  Central nervous system: Alert and oriented x 1 Extremities: No pedal edema. Skin: No rashes, lesions or ulcers Psychiatry: No judgment or insight of medical condition   Data Reviewed:    Labs: Basic Metabolic Panel: Recent Labs  Lab 07/11/24 2217 07/11/24 2220 07/12/24 0433 07/13/24 0455  NA 142 143 142 140  K 3.8 3.6 3.4* 3.8  CL 104 103 104 107  CO2 29  --  29 25  GLUCOSE 124* 124* 85 99  BUN 18 19 16 20   CREATININE 1.62* 1.70* 1.60* 1.62*  CALCIUM  9.2  --  8.9 9.1  MG  --   --  1.9  --    GFR Estimated Creatinine Clearance: 61.8 mL/min (A) (by C-G formula based on SCr of 1.62 mg/dL (H)). Liver Function Tests: Recent Labs  Lab 07/11/24 2217  AST 32  ALT 24  ALKPHOS 102  BILITOT 0.9  PROT 7.0  ALBUMIN  3.8   No results for input(s): LIPASE, AMYLASE in the last 168 hours. No results for input(s): AMMONIA in the last 168 hours. Coagulation profile Recent Labs  Lab 07/11/24 2217  INR 1.0   COVID-19 Labs  No results for input(s): DDIMER, FERRITIN, LDH, CRP in the last 72 hours.  No  results found for: SARSCOV2NAA  CBC: Recent Labs  Lab 07/11/24 2217 07/11/24 2220 07/12/24 0433  WBC 6.8  --  6.9  NEUTROABS 4.2  --   --   HGB 14.7 15.0 13.8  HCT 45.4 44.0 43.7  MCV 95.4  --  95.8  PLT 237  --  233   Cardiac Enzymes: No results for input(s): CKTOTAL, CKMB, CKMBINDEX, TROPONINI in the last 168 hours. BNP (last 3 results) No results for input(s): PROBNP in the last 8760 hours. CBG: Recent Labs  Lab 07/11/24 2208  GLUCAP 122*   D-Dimer: No results for input(s): DDIMER in the last 72 hours. Hgb A1c: No results for input(s): HGBA1C in the last 72 hours. Lipid Profile: No results for input(s): CHOL, HDL, LDLCALC, TRIG, CHOLHDL, LDLDIRECT in the last 72 hours. Thyroid  function studies: No results for input(s): TSH, T4TOTAL, T3FREE, THYROIDAB in the last 72 hours.  Invalid input(s): FREET3 Anemia work up: No results for input(s): VITAMINB12, FOLATE, FERRITIN, TIBC, IRON , RETICCTPCT in the last 72 hours. Sepsis Labs: Recent Labs  Lab 07/11/24 2217 07/12/24 0433  WBC 6.8 6.9   Microbiology No results found for this or any previous visit (from the past 240 hours).   Medications:    atorvastatin   80 mg Oral Daily   hydrALAZINE   100 mg Oral BID   levETIRAcetam   500 mg Oral BID   memantine   10 mg Oral BID   metoprolol  succinate  100 mg Oral BID   pantoprazole   40 mg Oral Daily   rivastigmine   6 mg Oral BID   Continuous Infusions:    LOS: 1 day   Erle Odell Castor  [facility]  07/13/2024, 7:05 AM

## 2024-07-13 NOTE — TOC Initial Note (Signed)
 Transition of Care (TOC) - Initial/Assessment Note    Patient Details  Name: Tony Davis. MRN: [id number] Date of Birth: 14-Jun-1961  Transition of Care [facility]) CM/SW Contact:    Tony JULIANNA George, RN Phone Number: 07/13/2024, 2:12 PM  Clinical Narrative:                  CM met with the patient and his spouse. Spouse states pt is active with [facility] and goes to the day program 3 days of the week. He is home alone the other 2 days but she has cameras that monitor him. She says the days after [facility] he is usually very tired and doesn't get up and move around much.  Pt receives his medications via [facility] and wife provides them to him. Pt either rides with [facility] or his wife provides needed transportation.  CM has spoken to his [facility] SW: Tony Davis and inquired about a wheelchair for the community per his wife's request. She is going to follow up with his therapy team.  Wife states she will transport the patient home when discharged.  IP Care Management following.  Expected Discharge Plan: Home/Self Care Barriers to Discharge: Continued Medical Work up   Patient Goals and CMS Choice            Expected Discharge Plan and Services   Discharge Planning Services: CM Consult   Living arrangements for the past 2 months: Single Family Home                                      Prior Living Arrangements/Services Living arrangements for the past 2 months: Single Family Home Lives with:: Spouse Patient language and need for interpreter reviewed:: Yes Do you feel safe going back to the place where you live?: Yes        Care giver support system in place?: Yes (comment) Current home services: DME (rollator/ walker/ shower seat/ BSC/ hospital bed) Criminal Activity/Legal Involvement Pertinent to Current Situation/Hospitalization: No - Comment as needed  Activities of Daily Living   ADL Screening (condition at time of admission) Is the patient deaf or have difficulty hearing?:  No Does the patient have difficulty seeing, even when wearing glasses/contacts?: No Does the patient have difficulty concentrating, remembering, or making decisions?: No  Permission Sought/Granted                  Emotional Assessment Appearance:: Appears stated age   Affect (typically observed): Quiet     Psych Involvement: No (comment)  Admission diagnosis:  Seizure [facility]) [R56.9] Patient Active Problem List   Diagnosis Date Noted   Seizure (HCC) 07/12/2024   Vascular dementia (HCC) 07/11/2024   Chronic kidney disease, stage 3a (HCC) 07/11/2024   Cerebellar hemorrhage (HCC) 09/19/2023   Left renal mass 01/18/2023   Obesity (BMI 30-39.9) 04/21/2019   Altered mental status 09/20/2018   Hypokalemia 12/26/2015   Depression 02/03/2015   Diastolic dysfunction with chronic heart failure (HCC) 11/30/2014   Essential hypertension 11/03/2014   LV dysfunction    Hydrocephalus (HCC)    Basal ganglia hemorrhage (HCC) 07/30/2014   Routine general medical examination at a health care facility 02/11/2014   Insomnia, persistent 03/25/2013   Chronic rhinitis 03/25/2013   History of hemorrhagic stroke with residual hemiparesis (HCC) 03/20/2013   Folate deficiency anemia 10/13/2012   Long term (current) use of anticoagulants 03/28/2012   Obstructive sleep  apnea 08/16/2011   Prediabetes 08/06/2011   PATENT FORAMEN OVALE 03/13/2010   CAD, NATIVE VESSEL 01/26/2010   Hyperlipidemia with target LDL less than 100 01/23/2010   GERD 01/23/2010   PCP:  Tony Annabella CROME, DO Pharmacy:   [facility] - [place], [place] - [address] [place] [address] Phone: [phone] Fax: [phone]  [facility] [address] [place] [address] Phone: [phone] Fax: [phone]     Social Drivers of Health (SDOH) Social History: SDOH Screenings   Food Insecurity: No Food Insecurity (07/12/2024)  Housing: Low Risk  (07/12/2024)   Transportation Needs: No Transportation Needs (07/12/2024)  Utilities: Not At Risk (07/12/2024)  Tobacco Use: Medium Risk (07/11/2024)   SDOH Interventions:     Readmission Risk Interventions     No data to display

## 2024-07-13 NOTE — Plan of Care (Signed)
  Problem: Education: Goal: Knowledge of General Education information will improve Description: Including pain rating scale, medication(s)/side effects and non-pharmacologic comfort measures Outcome: Progressing   Problem: Clinical Measurements: Goal: Will remain free from infection Outcome: Progressing   Problem: Nutrition: Goal: Adequate nutrition will be maintained Outcome: Progressing   Problem: Coping: Goal: Level of anxiety will decrease Outcome: Progressing   Problem: Elimination: Goal: Will not experience complications related to bowel motility Outcome: Progressing   Problem: Pain Managment: Goal: General experience of comfort will improve and/or be controlled Outcome: Progressing   Problem: Skin Integrity: Goal: Risk for impaired skin integrity will decrease Outcome: Progressing

## 2024-07-13 NOTE — Progress Notes (Addendum)
 NEUROLOGY CONSULT FOLLOW UP NOTE   Date of service: July 13, 2024 Patient Name: Tony Davis. MRN:  [id number] DOB:  1961-10-27  Interval Hx/subjective   Patient was noted to be hemodynamically stable and afebrile overnight.  He is oriented to person and place, disoriented to time and situation.  He dislodged EEG leads, and LTM EEG has thus been discontinued.  Vitals   Vitals:   07/12/24 1958 07/12/24 2338 07/13/24 0408 07/13/24 0844  BP: (!) 148/87 (!) 174/89 (!) 174/93 (!) 146/105  Pulse: 70 73 66 64  Resp: 18 16 18 17   Temp: 98.2 F (36.8 C) 98.5 F (36.9 C) 98.6 F (37 C) 98.2 F (36.8 C)  TempSrc: Oral Oral Oral Oral  SpO2: 98% 99% 97% 98%  Weight:      Height:         Body mass index is 32.55 kg/m.  Physical Exam   Constitutional: Appears well-developed and well-nourished.  Cardiovascular: Normal rate and regular rhythm.  Respiratory: Effort normal, non-labored breathing.   Neurologic Examination   Neuro: Mental Status: Patient is awake, alert, oriented to person and place but disoriented to time and situation Cranial Nerves: II: Pupils are equal, round, and reactive to light.   III,IV, VI: EOMI without ptosis or diploplia.  VII: Facial movement is symmetric.  VIII: hearing is intact to voice X: Patient is normal XII: tongue is midline  Motor: No focal weakness.  Able to move all 4 extremities to command with good antigravity strength Sensory: Subjectively symmetric throughout  Medications  Current Facility-Administered Medications:    acetaminophen  (TYLENOL ) tablet 650 mg, 650 mg, Oral, Q4H PRN **OR** acetaminophen  (TYLENOL ) suppository 650 mg, 650 mg, Rectal, Q4H PRN, Tobie, Jorie SAUNDERS, MD   atorvastatin  (LIPITOR ) tablet 80 mg, 80 mg, Oral, Daily, Patel, Vishal R, MD, 80 mg at 07/12/24 9093   hydrALAZINE  (APRESOLINE ) injection 10 mg, 10 mg, Intravenous, Q4H PRN, Patel, Vishal R, MD, 10 mg at 07/12/24 0645   hydrALAZINE  (APRESOLINE ) tablet 100  mg, 100 mg, Oral, BID, Patel, Vishal R, MD, 100 mg at 07/12/24 2204   levETIRAcetam  (KEPPRA ) tablet 500 mg, 500 mg, Oral, BID, Michaela Aisha SQUIBB, MD, 500 mg at 07/12/24 2205   lisinopril  (ZESTRIL ) tablet 20 mg, 20 mg, Oral, Daily, Odell Celinda Balo, MD   LORazepam  (ATIVAN ) injection 2 mg, 2 mg, Intravenous, Q5 Min x 2 PRN, Tobie Jorie SAUNDERS, MD   memantine  (NAMENDA ) tablet 10 mg, 10 mg, Oral, BID, Patel, Vishal R, MD, 10 mg at 07/12/24 2205   metoprolol  succinate (TOPROL -XL) 24 hr tablet 100 mg, 100 mg, Oral, BID, Patel, Vishal R, MD, 100 mg at 07/12/24 2205   ondansetron  (ZOFRAN ) tablet 4 mg, 4 mg, Oral, Q6H PRN **OR** ondansetron  (ZOFRAN ) injection 4 mg, 4 mg, Intravenous, Q6H PRN, Tobie, Vishal R, MD   pantoprazole  (PROTONIX ) EC tablet 40 mg, 40 mg, Oral, Daily, Patel, Vishal R, MD, 40 mg at 07/12/24 9093   rivastigmine  (EXELON ) capsule 6 mg, 6 mg, Oral, BID, Patel, Vishal R, MD, 6 mg at 07/12/24 2204   senna-docusate (Senokot-S) tablet 1 tablet, 1 tablet, Oral, QHS PRN, Tobie Jorie SAUNDERS, MD  Labs and Diagnostic Imaging   CBC:  Recent Labs  Lab 07/11/24 2217 07/11/24 2220 07/12/24 0433  WBC 6.8  --  6.9  NEUTROABS 4.2  --   --   HGB 14.7 15.0 13.8  HCT 45.4 44.0 43.7  MCV 95.4  --  95.8  PLT 237  --  233  Basic Metabolic Panel:  Lab Results  Component Value Date   NA 140 07/13/2024   K 3.8 07/13/2024   CO2 25 07/13/2024   GLUCOSE 99 07/13/2024   BUN 20 07/13/2024   CREATININE 1.62 (H) 07/13/2024   CALCIUM  9.1 07/13/2024   GFRNONAA 48 (L) 07/13/2024   GFRAA >60 09/21/2018   Lipid Panel:  Lab Results  Component Value Date   LDLCALC 29 09/21/2023   HgbA1c:  Lab Results  Component Value Date   HGBA1C 5.8 (H) 09/21/2023   Urine Drug Screen:     Component Value Date/Time   LABOPIA NONE DETECTED 09/21/2023 0005   COCAINSCRNUR NONE DETECTED 09/21/2023 0005   LABBENZ NONE DETECTED 09/21/2023 0005   AMPHETMU NONE DETECTED 09/21/2023 0005   THCU NONE DETECTED  09/21/2023 0005   LABBARB NONE DETECTED 09/21/2023 0005    Alcohol Level     Component Value Date/Time   ETH <15 07/11/2024 2209   INR  Lab Results  Component Value Date   INR 1.0 07/11/2024   APTT  Lab Results  Component Value Date   APTT 30 07/11/2024    CT Head without contrast(Personally reviewed): Negative acute, he does have chronic changes associate with his previous strokes and a VP shunt in place   CT angio Head and Neck with contrast(Personally reviewed): Negative  MRI Brain(Personally reviewed): PENDING  LTM EEG 8/2 2334 to 8/3 0730:  This study is suggestive of moderate diffuse encephalopathy, nonspecific etiology but likely related to sedation, toxic-metabolic etiology. No seizures or epileptiform discharges were seen throughout the recording.  LTM EEG 8/3 to 8/4: Impression:   This is an abnormal EEG due to the presence of intermittent background theta slowing, suggesting mild encephalopathy, which is nonspecific as to etiology. No epileptiform discharge or seizure is present.    Assessment   Oryon Gary. is a 63 y.o. male with a history of left basal ganglia and right cerebellar hemorrhagic strokes, hydrocephalus status post VP shunt, CAD, hypertension, hyperlipidemia, DVT status post IVC filter, CKD stage IIIa, renal cancer status post left nephrectomy, sleep apnea and vascular dementia who presented 8/2 with persistent confusion after an episode concerning for partial seizure, which improved after Ativan  administration.   LTM did not show seizures and was discontinued overnight after patient removed leads.  Will continue Keppra  as he has significant seizure risk factors and initial symptoms were likely caused by a seizure.  Given history of vascular dementia and MMSE score of 22 recently, suspect he is close to his baseline at this point.  Impression: New onset seizure in the presence of multiple risk factors such as previous history of ICH,  hydrocephalus status post VP shunt, vascular dementia  Recommendations   - continue seizure precautions - continue Keppra  500mg  BID - Discontinue LTM EEG -Thiamine, B12, folate, TSH, RPR-replenish as necessary. Outpatient neurology follow-up in 8 to 12 weeks  ______________________________________________________________________  Patient seen by NP with MD, MD to edit note as needed.  Cortney E Everitt Clint Kill , MSN, AGACNP-BC [facility] See Amion for schedule and pager information 07/13/2024 10:39 AM   Attending Neurohospitalist Addendum Patient seen and examined with APP/Resident. Agree with the history and physical as documented above. Agree with the plan as documented, which I helped formulate. I have independently reviewed the chart, obtained history, review of systems and examined the patient.I have personally reviewed pertinent head/neck/spine imaging (CT/MRI).  Plan relayed to Dr. Odell Castor  Please feel free to call with any questions.  --  Eligio Lav, MD Neurologist [facility] Pager: [phone]

## 2024-07-13 NOTE — Procedures (Signed)
 Continuous Video-EEG Monitoring Report  CPT/Type of Study: 95720 (EEG with video, 12-26 hours)   Indications for Procedure: Altered mental status Primary neurological diagnosis: G93.40  Duration of study: 07/12/2024 07:30 to 07/13/2024 07:30  History: This is a 63 year old male presenting with altered mental status. Continuous video-EEG monitoring was performed to evaluate for seizure.   Technical Description: The EEG was performed using standard setting per the guidelines of American Clinical Neurophysiology Society (ACNS). A minimum of 21 electrodes were placed on scalp according to the International 10-20 or/and 10-10 Systems. Supplemental electrodes were placed as needed. Single EKG electrode was also used to detect cardiac arrhythmia. Patient's behavior was continuously recorded on video simultaneously with EEG. A minimum of 16 channels were used for data display. Each epoch of study was reviewed manually daily and as needed using standard referential and bipolar montages. Computerized quantitative EEG analysis (such as compressed spectral array analysis, trending, automated spike & seizure detection) were used as indicated.   EEG Description:  Epoch: 07/12/2024 07:30 to 07/13/2024 07:30  Background: During wakefulness, there was a bilateral reactive 8 Hz posterior dominant rhythm. Intermittent background theta slowing was present. During drowsiness, slow roving eye movement, vertex waves and generalized slowing were seen. K complexes and spindles were present in stage II sleep.   PERIODIC OR RHYTHMIC PATTERNS: None.  EPILEPTIFORM DISCHARGES: None.  SEIZURES: None.  EVENTS: None.  EKG: No significant arrhythmia.   NOTE: Video-EEG was degraded after 02:42 on 07/13/2024 because patient removed EEG leads.   Impression:  This is an abnormal EEG due to the presence of intermittent background theta slowing, suggesting mild encephalopathy, which is nonspecific as to etiology. No  epileptiform discharge or seizure is present.

## 2024-07-14 ENCOUNTER — Inpatient Hospital Stay (HOSPITAL_COMMUNITY): Payer: Medicare (Managed Care)

## 2024-07-14 DIAGNOSIS — R569 Unspecified convulsions: Secondary | ICD-10-CM | POA: Diagnosis not present

## 2024-07-14 LAB — RPR: RPR Ser Ql: NONREACTIVE

## 2024-07-14 LAB — FOLATE: Folate: 4.9 ng/mL — ABNORMAL LOW (ref >5.9)

## 2024-07-14 LAB — VITAMIN B12: Vitamin B-12: 239 pg/mL (ref 180–914)

## 2024-07-14 LAB — TSH: TSH: 1.603 u[IU]/mL (ref 0.350–4.500)

## 2024-07-14 MED ORDER — ISOSORBIDE MONONITRATE ER 30 MG PO TB24
15.0000 mg | ORAL_TABLET | Freq: Every day | ORAL | Status: DC
  Administered 2024-07-14 – 2024-07-15 (×2): 15 mg via ORAL
  Filled 2024-07-14 (×2): qty 1

## 2024-07-14 MED ORDER — LISINOPRIL 20 MG PO TABS
40.0000 mg | ORAL_TABLET | Freq: Every day | ORAL | Status: DC
  Administered 2024-07-14 – 2024-07-15 (×2): 40 mg via ORAL
  Filled 2024-07-14 (×2): qty 2

## 2024-07-14 MED ORDER — CYANOCOBALAMIN 1000 MCG/ML IJ SOLN
1000.0000 ug | Freq: Once | INTRAMUSCULAR | Status: AC
  Administered 2024-07-14: 1000 ug via SUBCUTANEOUS
  Filled 2024-07-14: qty 1

## 2024-07-14 MED ORDER — FOLIC ACID 1 MG PO TABS
1.0000 mg | ORAL_TABLET | Freq: Every day | ORAL | Status: DC
  Administered 2024-07-14 – 2024-07-15 (×2): 1 mg via ORAL
  Filled 2024-07-14 (×2): qty 1

## 2024-07-14 NOTE — Progress Notes (Signed)
 Pt off from unit for MRI

## 2024-07-14 NOTE — Evaluation (Signed)
 Physical Therapy Evaluation Patient Details Name: Tony Davis. MRN: [id number] DOB: January 21, 1961 Today's Date: 07/14/2024  History of Present Illness  Tony Davis. is an 63 y.o. male brought into the ED for unresponsiveness and aphasia, and a postictal state s/p seizure. PMH: basal ganglia and right cerebellar hemorrhagic stroke last in 2024 with right residual deficits, hydrocephalus status post VP shunt in 2015, CAD status post stent 2011, essential hypertension history of bilateral DVT status post IVC filter, chronic AC stage III a, clear-cell papillary carcinoma status post nephrectomy in 2020 for vascular dementia   Clinical Impression  Pt admitted with above. PTA pt went to PACES 3x/wk and stayed home alone 2x/wk with video cameras that allow wife to talk to patient while she's at work. Per chart pt stays in bed or electric w/c during the day when home alone. Pt mobilizing with minA at this time, suspect this to be near baseline. Pt would benefit from 5 days at PACES if possible to return to baseline level of function. Acute PT to cont to follow.        If plan is discharge home, recommend the following: A little help with walking and/or transfers;A little help with bathing/dressing/bathroom   Can travel by private vehicle        Equipment Recommendations None recommended by PT  Recommendations for Other Services       Functional Status Assessment Patient has had a recent decline in their functional status and demonstrates the ability to make significant improvements in function in a reasonable and predictable amount of time.     Precautions / Restrictions Precautions Precautions: Fall Precaution/Restrictions Comments: R hemiplegia Restrictions Weight Bearing Restrictions Per Provider Order: No      Mobility  Bed Mobility Overal bed mobility: Needs Assistance Bed Mobility: Supine to Sit, Sit to Supine     Supine to sit: Contact guard Sit to supine: Min  assist   General bed mobility comments: pt used bed rail and brought self to EOB, minA for LE management back into bed    Transfers Overall transfer level: Needs assistance Equipment used: 1 person hand held assist Transfers: Sit to/from Stand Sit to Stand: Min assist           General transfer comment: minA to power up via L HHA, modA to maintain balance for side steps to [facility], used upper bed rail to steady self as well. noted ataxic gait pattern with R LE but able to clear bilat feet    Ambulation/Gait               General Gait Details: limited to side steps to [facility]  Stairs            Wheelchair Mobility     Tilt Bed    Modified Rankin (Stroke Patients Only)       Balance Overall balance assessment: Needs assistance Sitting-balance support: Feet supported, No upper extremity supported Sitting balance-Leahy Scale: Fair     Standing balance support: Bilateral upper extremity supported, During functional activity Standing balance-Leahy Scale: Fair Standing balance comment: reliant on external support                             Pertinent Vitals/Pain Pain Assessment Pain Assessment: No/denies pain    Home Living Family/patient expects to be discharged to:: Private residence Living Arrangements: Spouse/significant other Available Help at Discharge: Family;Available PRN/intermittently Type of Home: House Home Access: Stairs  to enter Entrance Stairs-Rails: Can reach both Entrance Stairs-Number of Steps: 4   Home Layout: One level Home Equipment: Agricultural consultant (2 wheels);Cane - single point;Shower seat;Grab bars - tub/shower;Hand held shower head;BSC/3in1 Additional Comments: Information from prior encounter; per chart pt goes to PACES 3x/wk and is home alone 2x/wk in which patient's wife has cameras and talks to him through them to patient    Prior Function Prior Level of Function : Needs assist;Patient poor historian/Family not  available             Mobility Comments: pt states he uses electric w/c sometimes and sometimes walks however per chart pt stays in the bed or w/c when home alone ADLs Comments: pt able to feed self but requires assist for dressing/bathing, food prep     Extremity/Trunk Assessment   Upper Extremity Assessment Upper Extremity Assessment: RUE deficits/detail RUE Deficits / Details: noted increased flexor tone however can use R UE functionally to eat    Lower Extremity Assessment Lower Extremity Assessment: RLE deficits/detail RLE Deficits / Details: residual weakness from previous stroke    Cervical / Trunk Assessment Cervical / Trunk Assessment: Normal  Communication   Communication Communication: No apparent difficulties    Cognition Arousal:  (was sleeping and awoken easily) Behavior During Therapy: WFL for tasks assessed/performed   PT - Cognitive impairments: History of cognitive impairments                       PT - Cognition Comments: pt with noted impaired cognition asking what to eat his ice cream with despite having a spoon in his hand however pt able to follow simple commands consistently Following commands: Intact       Cueing Cueing Techniques: Verbal cues     General Comments General comments (skin integrity, edema, etc.): VSS    Exercises     Assessment/Plan    PT Assessment Patient needs continued PT services  PT Problem List Decreased strength;Decreased activity tolerance;Decreased balance;Decreased mobility       PT Treatment Interventions DME instruction;Gait training;Stair training;Functional mobility training;Therapeutic activities;Therapeutic exercise;Balance training;Neuromuscular re-education    PT Goals (Current goals can be found in the Care Plan section)  Acute Rehab PT Goals Patient Stated Goal: didn't state PT Goal Formulation: With patient Time For Goal Achievement: 07/28/24 Potential to Achieve Goals: Good     Frequency Min 2X/week     Co-evaluation               AM-PAC PT 6 Clicks Mobility  Outcome Measure Help needed turning from your back to your side while in a flat bed without using bedrails?: A Little Help needed moving from lying on your back to sitting on the side of a flat bed without using bedrails?: A Little Help needed moving to and from a bed to a chair (including a wheelchair)?: A Little Help needed standing up from a chair using your arms (e.g., wheelchair or bedside chair)?: A Lot Help needed to walk in hospital room?: Total Help needed climbing 3-5 steps with a railing? : Total 6 Click Score: 13    End of Session Equipment Utilized During Treatment: Gait belt Activity Tolerance: Patient tolerated treatment well Patient left: in bed;with call bell/phone within reach;with bed alarm set Nurse Communication: Mobility status PT Visit Diagnosis: Unsteadiness on feet (R26.81);Muscle weakness (generalized) (M62.81);Difficulty in walking, not elsewhere classified (R26.2)    Time: 1345-1411 PT Time Calculation (min) (ACUTE ONLY): 26 min   Charges:  PT Evaluation $PT Eval Low Complexity: 1 Low PT Treatments $Therapeutic Activity: 8-22 mins PT General Charges $$ ACUTE PT VISIT: 1 Visit         Norene Ames, PT, DPT Acute Rehabilitation Services Secure chat preferred Office #: [phone]   Norene CHRISTELLA Ames 07/14/2024, 2:40 PM

## 2024-07-14 NOTE — Progress Notes (Signed)
 [facility] PROGRESS NOTE    Progress Note  Tony Davis.  FMW:[id number] DOB: 04-01-1961 DOA: 07/11/2024 PCP: Tony Annabella CROME, DO     Brief Narrative:   Tony Davis. is an 63 y.o. male past medical history significant for basal ganglia and right cerebellar hemorrhagic stroke last in 2024 with right residual deficits, hydrocephalus status post VP shunt in 2015, CAD status post stent 2011, essential hypertension history of bilateral DVT status post IVC filter, chronic AC stage III a, clear-cell papillary carcinoma status post nephrectomy in 2020 for vascular dementia brought into the ED for unresponsiveness and aphasia, and a postictal state.  Assessment/Plan:   Seizure activity: Sudden onset of aphasia and postictal in the ED. CT of the head was negative for any acute finding. EEG showed intermittent background slowing probably due to encephalopathy no evidence of seizures.  LTM EEG has been discontinued. Continue Keppra  p.o. twice daily, follow-up with neurology as an outpatient in 8 to 12 weeks. MRI pending. PT OT has been consulted, anticipate will need skilled nursing facility.  History of hemorrhagic stroke with residual hemiparesis  with a VP shunt: CT of the head showed no acute findings.  Chronic kidney disease stage III yea/clear-cell papillary renal carcinoma: With a history of nephrectomy. Renal function appears to be stable.  CAD with a history of DES to the circumflex in 2011: Continue statins.  Essential hypertension: Continue metoprolol  and hydralazine . Blood pressure still elevated increase lisinopril  start Imdur .  Hyperlipidemia: Continue statins.  Vascular dementia: Continue Namenda  and Exelon . Haldol as needed, he is at high risk of acute confusional state.  Folate and B12 deficiency: Repeat and recheck in the morning   DVT prophylaxis: lovenox  Family Communication:noen Status is: Inpatient Remains inpatient appropriate because:  Seizure activity    Code Status:     Code Status Orders  (From admission, onward)           Start     Ordered   07/12/24 0108  Full code  Continuous       Question:  By:  Answer:  Consent: discussion documented in EHR   07/12/24 0109           Code Status History     Date Active Date Inactive Code Status Order ID Comments User Context   09/21/2023 1148 09/26/2023 1903 Do not attempt resuscitation (DNR) PRE-ARREST INTERVENTIONS DESIRED [id number]  Dannette Stephane HERO, NP Inpatient   09/19/2023 2328 09/21/2023 1148 Full Code [id number]  Jerrie Lola CROME, MD ED   01/18/2023 1354 01/21/2023 1723 Full Code [id number]  Sherrel Franky BRAVO, MD Inpatient   09/20/2018 2102 09/21/2018 1747 Full Code [id number]  Luke Agent, MD ED   11/29/2015 0143 11/30/2015 1748 Full Code [id number]  Franky Redia SAILOR, MD Inpatient   11/30/2014 2112 12/02/2014 2116 Full Code [id number]  Remonia Alm JINNY, MD Inpatient   07/30/2014 2056 09/07/2014 1640 Full Code [id number]  Maurice Sharlet RAMAN, PA-C Inpatient   07/08/2014 1430 07/30/2014 2056 Full Code [id number]  Myrna Shields Inpatient   04/15/2014 0302 04/16/2014 1827 Full Code [id number]  Alm Maxwell LABOR, MD Inpatient   09/24/2013 1953 09/27/2013 1138 Full Code [id number]  Harlow Ozell BRAVO Inpatient   03/19/2012 1541 03/24/2012 1439 Full Code [id number]  Verlin Lonni BIRCH, MD Inpatient         IV Access:   Peripheral IV   Procedures and diagnostic studies:   Discontinue Long Term Monitor EEG Result Date: 07/13/2024 Maurine Jurist, MD  07/13/2024 11:55 AM Continuous Video-EEG Monitoring Report CPT/Type of Study: 95720 (EEG with video, 12-26 hours)  Indications for Procedure: Altered mental status Primary neurological diagnosis: G93.40 Duration of study: 07/12/2024 07:30 to 07/13/2024 07:30 History: This is a 63 year old male presenting with altered mental status. Continuous video-EEG monitoring was performed to evaluate for seizure. Technical Description: The EEG was performed  using standard setting per the guidelines of American Clinical Neurophysiology Society (ACNS). A minimum of 21 electrodes were placed on scalp according to the International 10-20 or/and 10-10 Systems. Supplemental electrodes were placed as needed. Single EKG electrode was also used to detect cardiac arrhythmia. Patient's behavior was continuously recorded on video simultaneously with EEG. A minimum of 16 channels were used for data display. Each epoch of study was reviewed manually daily and as needed using standard referential and bipolar montages. Computerized quantitative EEG analysis (such as compressed spectral array analysis, trending, automated spike & seizure detection) were used as indicated. EEG Description: Epoch: 07/12/2024 07:30 to 07/13/2024 07:30 Background: During wakefulness, there was a bilateral reactive 8 Hz posterior dominant rhythm. Intermittent background theta slowing was present. During drowsiness, slow roving eye movement, vertex waves and generalized slowing were seen. K complexes and spindles were present in stage II sleep. PERIODIC OR RHYTHMIC PATTERNS: None. EPILEPTIFORM DISCHARGES: None. SEIZURES: None. EVENTS: None. EKG: No significant arrhythmia. NOTE: Video-EEG was degraded after 02:42 on 07/13/2024 because patient removed EEG leads. Impression: This is an abnormal EEG due to the presence of intermittent background theta slowing, suggesting mild encephalopathy, which is nonspecific as to etiology. No epileptiform discharge or seizure is present.     Overnight EEG with video Result Date: 07/12/2024 [name], [name], MD     07/12/2024  9:23 AM Patient Name: Tony Davis. MRN: [id number] Epilepsy Attending: Pastor Falling Referring Physician/Provider: No ref. provider found     Date: 07/12/2024 Start time: 07/11/2024 at 2334 End Time: 07/12/2024 at 0730 Duration: 8 hours and 4 minutes Patient history: 63 y.o. male with hx of previous ICH, hydrocephalus status post VP shunt, vascular  dementia, history of DVT, who presented to the emergency department for an episode of unresponsiveness. EEG to evaluate for seizure Level of alertness: lethargic AEDs during EEG study: LEV Technical aspects: This EEG study was done with scalp electrodes positioned according to the 10-20 International system of electrode placement. Electrical activity was reviewed with band pass filter of 1-70Hz , sensitivity of 7 uV/mm, display speed of 59mm/sec with a 60Hz  notched filter applied as appropriate. EEG data were recorded continuously and digitally stored.  Video monitoring was available and reviewed as appropriate. Description: EEG showed continuous/intermittent generalized delta-theta generalized slowing. Hyperventilation and photic stimulation were not performed.   ABNORMALITY - Continuous slow, generalized IMPRESSION: This study is suggestive of moderate diffuse encephalopathy, nonspecific etiology but likely related to sedation, toxic-metabolic etiology. No seizures or epileptiform discharges were seen throughout the recording. Pastor Falling MD Neurology      Medical Consultants:   None.   Subjective:    Tony Davis. no complaints  Objective:    Vitals:   07/13/24 1613 07/13/24 1946 07/14/24 0414 07/14/24 0743  BP: (!) 178/102 134/83 (!) 162/95 (!) 169/92  Pulse: 70 71 68 62  Resp:  16 16 19   Temp: 97.6 F (36.4 C) 98.1 F (36.7 C) 98.2 F (36.8 C) 97.7 F (36.5 C)  TempSrc: Oral Oral Oral Oral  SpO2: 98% 96% 98% 98%  Weight:      Height:  SpO2: 98 %   Intake/Output Summary (Last 24 hours) at 07/14/2024 0839 Last data filed at 07/13/2024 1614 Gross per 24 hour  Intake 614 ml  Output 550 ml  Net 64 ml   Filed Weights   07/11/24 2211  Weight: 108.9 kg    Exam: General exam: In no acute distress. Respiratory system: Good air movement and clear to auscultation. Cardiovascular system: S1 & S2 heard, RRR. No JVD. Gastrointestinal system: Abdomen is nondistended,  soft and nontender.  Extremities: No pedal edema. Skin: No rashes, lesions or ulcers Psychiatry: Judgement and insight appear normal. Mood & affect appropriate.  Data Reviewed:    Labs: Basic Metabolic Panel: Recent Labs  Lab 07/11/24 2217 07/11/24 2220 07/12/24 0433 07/13/24 0455  NA 142 143 142 140  K 3.8 3.6 3.4* 3.8  CL 104 103 104 107  CO2 29  --  29 25  GLUCOSE 124* 124* 85 99  BUN 18 19 16 20   CREATININE 1.62* 1.70* 1.60* 1.62*  CALCIUM  9.2  --  8.9 9.1  MG  --   --  1.9  --    GFR Estimated Creatinine Clearance: 61.8 mL/min (A) (by C-G formula based on SCr of 1.62 mg/dL (H)). Liver Function Tests: Recent Labs  Lab 07/11/24 2217  AST 32  ALT 24  ALKPHOS 102  BILITOT 0.9  PROT 7.0  ALBUMIN  3.8   No results for input(s): LIPASE, AMYLASE in the last 168 hours. No results for input(s): AMMONIA in the last 168 hours. Coagulation profile Recent Labs  Lab 07/11/24 2217  INR 1.0   COVID-19 Labs  No results for input(s): DDIMER, FERRITIN, LDH, CRP in the last 72 hours.  No results found for: SARSCOV2NAA  CBC: Recent Labs  Lab 07/11/24 2217 07/11/24 2220 07/12/24 0433  WBC 6.8  --  6.9  NEUTROABS 4.2  --   --   HGB 14.7 15.0 13.8  HCT 45.4 44.0 43.7  MCV 95.4  --  95.8  PLT 237  --  233   Cardiac Enzymes: No results for input(s): CKTOTAL, CKMB, CKMBINDEX, TROPONINI in the last 168 hours. BNP (last 3 results) No results for input(s): PROBNP in the last 8760 hours. CBG: Recent Labs  Lab 07/11/24 2208  GLUCAP 122*   D-Dimer: No results for input(s): DDIMER in the last 72 hours. Hgb A1c: No results for input(s): HGBA1C in the last 72 hours. Lipid Profile: No results for input(s): CHOL, HDL, LDLCALC, TRIG, CHOLHDL, LDLDIRECT in the last 72 hours. Thyroid  function studies: Recent Labs    07/14/24 0506  TSH 1.603   Anemia work up: Recent Labs    07/14/24 0506  VITAMINB12 239  FOLATE 4.9*    Sepsis Labs: Recent Labs  Lab 07/11/24 2217 07/12/24 0433  WBC 6.8 6.9   Microbiology No results found for this or any previous visit (from the past 240 hours).   Medications:    atorvastatin   80 mg Oral Daily   hydrALAZINE   100 mg Oral BID   levETIRAcetam   500 mg Oral BID   lisinopril   20 mg Oral Daily   memantine   10 mg Oral BID   metoprolol  succinate  100 mg Oral BID   pantoprazole   40 mg Oral Daily   rivastigmine   6 mg Oral BID   Continuous Infusions:    LOS: 2 days   Erle Odell Castor  [facility]  07/14/2024, 8:39 AM

## 2024-07-14 NOTE — Progress Notes (Signed)
 Back to Unit, alert,  check in completed, hooked back to tele monitoring.

## 2024-07-15 ENCOUNTER — Other Ambulatory Visit (HOSPITAL_COMMUNITY): Payer: Self-pay

## 2024-07-15 DIAGNOSIS — R569 Unspecified convulsions: Secondary | ICD-10-CM | POA: Diagnosis not present

## 2024-07-15 LAB — BASIC METABOLIC PANEL WITH GFR
Anion gap: 6 (ref 5–15)
BUN: 23 mg/dL — ABNORMAL HIGH (ref 6–20)
CO2: 30 mmol/L (ref 22–32)
Calcium: 9.2 mg/dL (ref 8.9–10.3)
Chloride: 103 mmol/L (ref 98–111)
Creatinine, Ser: 1.84 mg/dL — ABNORMAL HIGH (ref 0.61–1.24)
GFR, Estimated: 41 mL/min — ABNORMAL LOW (ref >60)
Glucose, Bld: 131 mg/dL — ABNORMAL HIGH (ref 70–99)
Potassium: 3.7 mmol/L (ref 3.5–5.1)
Sodium: 139 mmol/L (ref 135–145)

## 2024-07-15 MED ORDER — LISINOPRIL 40 MG PO TABS
40.0000 mg | ORAL_TABLET | Freq: Every day | ORAL | 0 refills | Status: DC
  Filled 2024-07-15: qty 90, 90d supply, fill #0
  Filled 2024-07-15: qty 30, 30d supply, fill #0

## 2024-07-15 MED ORDER — ISOSORBIDE MONONITRATE ER 30 MG PO TB24
15.0000 mg | ORAL_TABLET | Freq: Every day | ORAL | 0 refills | Status: AC
  Filled 2024-07-15: qty 45, 90d supply, fill #0
  Filled 2024-07-15: qty 15, 30d supply, fill #0

## 2024-07-15 MED ORDER — LEVETIRACETAM 500 MG PO TABS
500.0000 mg | ORAL_TABLET | Freq: Two times a day (BID) | ORAL | 0 refills | Status: AC
  Filled 2024-07-15: qty 180, 90d supply, fill #0
  Filled 2024-07-15: qty 60, 30d supply, fill #0

## 2024-07-15 MED ORDER — FOLIC ACID 1 MG PO TABS
1.0000 mg | ORAL_TABLET | Freq: Every day | ORAL | 0 refills | Status: AC
  Filled 2024-07-15: qty 30, 30d supply, fill #0
  Filled 2024-07-15: qty 90, 90d supply, fill #0

## 2024-07-15 NOTE — Plan of Care (Signed)
  Problem: Health Behavior/Discharge Planning: Goal: Ability to manage health-related needs will improve Outcome: Progressing   Problem: Clinical Measurements: Goal: Will remain free from infection Outcome: Progressing   Problem: Activity: Goal: Risk for activity intolerance will decrease Outcome: Progressing   Problem: Nutrition: Goal: Adequate nutrition will be maintained Outcome: Progressing   Problem: Elimination: Goal: Will not experience complications related to urinary retention Outcome: Progressing   Problem: Safety: Goal: Ability to remain free from injury will improve Outcome: Progressing   Problem: Skin Integrity: Goal: Risk for impaired skin integrity will decrease Outcome: Progressing

## 2024-07-15 NOTE — Care Management Important Message (Signed)
 Important Message  Patient Details  Name: Tony Davis. MRN: [id number] Date of Birth: 03-09-1961   Important Message Given:  Yes - Medicare IM     Claretta Deed 07/15/2024, 2:57 PM

## 2024-07-15 NOTE — Discharge Instructions (Signed)
Seizure precautions: Per [facility] statutes, patients with seizures are not allowed to drive until they have been seizure-free for six months and cleared by a physician    Use caution when using heavy equipment or power tools. Avoid working on ladders or at heights. Take showers instead of baths. Ensure the water temperature is not too high on the home water heater. Do not go swimming alone. Do not lock yourself in a room alone (i.e. bathroom). When caring for infants or small children, sit down when holding, feeding, or changing them to minimize risk of injury to the child in the event you have a seizure. Maintain good sleep hygiene. Avoid alcohol.    Instructions for family if you have a seizure in the future -----     During the Seizure   - First, ensure adequate ventilation and place the person seizing on the floor on their left side  Loosen clothing around the neck and ensure the airway is patent. If the person seizing is clenching the teeth, do not force the mouth open with any object as this can cause severe damage - Remove all items from the surrounding that can be hazardous. The patient having a seizure may be oblivious to what's happening and may not even know what he or she is doing. If the patient is confused and wandering, either gently guide him/her away and block access to outside areas - Provide reassurance and be comforting - Call 911. In most cases, the seizure ends before EMS arrives. However, there are cases when seizures may last over 3 to 5 minutes. Or the individual may have developed breathing difficulties or severe injuries. If a pregnant patient or a person with diabetes develops a seizure, it is prudent to call an ambulance. - Most patients will remain confused for about 45 to 90 minutes after a seizure, so you must use judgment in calling for help. - Avoid restraints but make sure the patient is in a bed with padded side rails - Place the individual in a lateral  position with the neck slightly flexed; this will help the saliva drain from the mouth and prevent the tongue from falling backward - Remove all nearby furniture and other hazards from the area - Provide the patient with privacy if possible - Call for help and start treatment as ordered by the caregiver  Call 911 and bring them to the ED if: A.  The seizure lasts longer than 5 minutes.      B.  If you don't wake shortly after the seizure or has new problems such as difficulty seeing, speaking or moving following the seizure C.  If you were injured during the seizure D.  If you have a temperature over 102 F (39C) E.  If you vomited during the seizure and now is having trouble breathing     After the Seizure (Postictal Stage)   After a seizure, most patients experience confusion, fatigue, muscle pain and/or a headache. Thus, one should permit the individual to sleep. For the next few days, reassurance is essential. Being calm and helping reorient the person is also of importance.   Most seizures are painless and end spontaneously. Seizures are not harmful to others but can lead to complications such as stress on the lungs, brain and the heart. Individuals with prior lung problems may develop labored breathing and respiratory distress.

## 2024-07-15 NOTE — Progress Notes (Deleted)
 Waiting for MD order reconciliation; CM needs to see.

## 2024-07-15 NOTE — Evaluation (Addendum)
 Occupational Therapy Evaluation Patient Details Name: Tony Davis. MRN: [id number] DOB: May 03, 1961 Today's Date: 07/15/2024   History of Present Illness   Tony Davis. is an 63 y.o. male brought into the ED for unresponsiveness and aphasia, and a postictal state s/p seizure. PMH: basal ganglia and right cerebellar hemorrhagic stroke last in 2024 with right residual deficits, hydrocephalus status post VP shunt in 2015, CAD status post stent 2011, essential hypertension history of bilateral DVT status post IVC filter, chronic AC stage III a, clear-cell papillary carcinoma status post nephrectomy in 2020 for vascular dementia     Clinical Impressions Pt admitted for above, Pt is a poor historian so used prior noted to obtain a baseline measure and pt noted to live with spouse who assisted with ADLs and pt was in w/c or bed when home alone. Pt currently presenting with impaired cognition, unsure of baseline but pt needing cues to sequence mobility and self care tasks. Pt able to ambulate 60ft to toilet with CGA +RW and cues, needing mod A to setup A for seated ADLs. He has a hx of CVA with residual RUE deficits noted. OT to continue following pt acutely to address listed deficits and progress pt closer to baseline. Patient would benefit from post acute Home OT services to help maximize functional independence in natural environment and pt would need direct supervision for mobility and ADLs for safety given needed cues. Tub bench beneficial so that pt does not have to step over and risk falls getting in/out of shower     If plan is discharge home, recommend the following:   A little help with bathing/dressing/bathroom;Assistance with cooking/housework;Supervision due to cognitive status;Direct supervision/assist for medications management     Functional Status Assessment   Patient has had a recent decline in their functional status and demonstrates the ability to make significant  improvements in function in a reasonable and predictable amount of time.     Equipment Recommendations   Tub/shower bench     Recommendations for Other Services         Precautions/Restrictions   Precautions Precautions: Fall Recall of Precautions/Restrictions: Impaired Precaution/Restrictions Comments: R hemiplegia Restrictions Weight Bearing Restrictions Per Provider Order: No     Mobility Bed Mobility Overal bed mobility: Needs Assistance Bed Mobility: Supine to Sit, Sit to Supine     Supine to sit: Contact guard     General bed mobility comments: inc time, cues for pt to position bilat feet on floor.    Transfers Overall transfer level: Needs assistance Equipment used: Rolling walker (2 wheels) Transfers: Sit to/from Stand Sit to Stand: Contact guard assist, From elevated surface           General transfer comment: CGA from raise bed ~64ft.      Balance Overall balance assessment: Needs assistance Sitting-balance support: Feet supported, No upper extremity supported Sitting balance-Leahy Scale: Fair     Standing balance support: Bilateral upper extremity supported, During functional activity Standing balance-Leahy Scale: Fair                             ADL either performed or assessed with clinical judgement   ADL Overall ADL's : Needs assistance/impaired Eating/Feeding: Set up;Sitting   Grooming: Sitting;Set up   Upper Body Bathing: Minimal assistance;Sitting   Lower Body Bathing: Sitting/lateral leans;Minimal assistance   Upper Body Dressing : Sitting;Minimal assistance   Lower Body Dressing: Moderate assistance;Sit to/from stand  Toilet Transfer: Contact guard assist;Rolling walker (2 wheels);Ambulation Toilet Transfer Details (indicate cue type and reason): cues to reposition and turn RW to navigate Toileting- Architect and Hygiene: Sit to/from stand;Moderate assistance       Functional mobility during  ADLs: Rolling walker (2 wheels)       Vision   Additional Comments: not fully assessed, nothing noted during functional assessment     Perception         Praxis         Pertinent Vitals/Pain Pain Assessment Pain Assessment: Faces Faces Pain Scale: No hurt     Extremity/Trunk Assessment Upper Extremity Assessment Upper Extremity Assessment: RUE deficits/detail (LUE WNL:) RUE Deficits / Details: RUE elbow strength and ROM WFL, seems to be some tone + clonus in the wrist. MCPs and PIPs with flexion contracture but PROM to almost neutral. Decreased coordination, AAROM shoulder flexion 110 deg but AROM 90 RUE Coordination: decreased fine motor;decreased gross motor   Lower Extremity Assessment Lower Extremity Assessment: RLE deficits/detail RLE Deficits / Details: residual weakness from previous stroke   Cervical / Trunk Assessment Cervical / Trunk Assessment: Normal   Communication Communication Communication: No apparent difficulties   Cognition Arousal: Alert Behavior During Therapy: WFL for tasks assessed/performed Cognition: No family/caregiver present to determine baseline   Orientation impairments: Place (Believed he was at [facility], reoriented pt that we were at [facility])   Memory impairment (select all impairments): Working Biochemist, clinical functioning impairment (select all impairments): Sequencing, Problem solving OT - Cognition Comments: needs cues to sequence getting onto toilet and bed mobility                 Following commands: Impaired Following commands impaired: Follows one step commands with increased time     Cueing  General Comments   Cueing Techniques: Verbal cues  Pt needing increased time for toileting, continued to have BM while on toilet. Pt NT at his side monitoring him at end of session   Exercises     Shoulder Instructions      Home Living Family/patient expects to be discharged to:: Private residence Living Arrangements:  Spouse/significant other Available Help at Discharge: Family;Available PRN/intermittently Type of Home: House Home Access: Stairs to enter [employer] of Steps: 4 Entrance Stairs-Rails: Can reach both Home Layout: One level     Bathroom Shower/Tub: Chief Strategy Officer: Standard Bathroom Accessibility: Yes   Home Equipment: Agricultural consultant (2 wheels);Cane - single point;Shower seat;Grab bars - tub/shower;Hand held shower head;BSC/3in1   Additional Comments: Information from prior encounter; per chart pt goes to [facility] 3x/wk and is home alone 2x/wk in which patient's wife has cameras and talks to him through them to patient. Pt reports differ from homesetup information in chart.      Prior Functioning/Environment Prior Level of Function : Needs assist;Patient poor historian/Family not available             Mobility Comments: pt states he uses electric w/c sometimes and sometimes walks however per chart pt stays in the bed or w/c when home alone ADLs Comments: pt able to feed self but requires assist for dressing/bathing, food prep    OT Problem List: Impaired balance (sitting and/or standing);Decreased cognition;Decreased coordination   OT Treatment/Interventions: Self-care/ADL training;Patient/family education;Therapeutic exercise;Balance training;Therapeutic activities;DME and/or AE instruction      OT Goals(Current goals can be found in the care plan section)   Acute Rehab OT Goals Patient Stated Goal: none stated OT Goal Formulation:  Patient unable to participate in goal setting Time For Goal Achievement: 07/29/24 Potential to Achieve Goals: Good ADL Goals Pt Will Perform Grooming: standing;with supervision Pt Will Perform Lower Body Bathing: with set-up;sitting/lateral leans Pt Will Perform Lower Body Dressing: sit to/from stand;with contact guard assist Pt Will Transfer to Toilet: with supervision;ambulating Additional ADL Goal #1: Pt will  sequence steps for all simple ADLs and mobility without the need for verbal cues.   OT Frequency:  Min 2X/week    Co-evaluation              AM-PAC OT 6 Clicks Daily Activity     Outcome Measure Help from another person eating meals?: A Little Help from another person taking care of personal grooming?: A Little Help from another person toileting, which includes using toliet, bedpan, or urinal?: A Little Help from another person bathing (including washing, rinsing, drying)?: A Little Help from another person to put on and taking off regular upper body clothing?: A Little Help from another person to put on and taking off regular lower body clothing?: A Lot 6 Click Score: 17   End of Session Equipment Utilized During Treatment: Gait belt;Rolling walker (2 wheels) Nurse Communication: Mobility status  Activity Tolerance: Patient tolerated treatment well Patient left: with call bell/phone within reach;with nursing/sitter in room;Other (comment) (Pt on bathroom toilet with NT)  OT Visit Diagnosis: Unsteadiness on feet (R26.81);Other symptoms and signs involving cognitive function                Time: 1150-1210 OT Time Calculation (min): 20 min Charges:  OT General Charges $OT Visit: 1 Visit OT Evaluation $OT Eval Low Complexity: 1 Low  07/15/2024  AB, OTR/L  Acute Rehabilitation Services  Office: [phone]   Curtistine JONETTA Das 07/15/2024, 1:50 PM

## 2024-07-15 NOTE — Discharge Summary (Signed)
 Physician Discharge Summary   Patient: Tony Davis. MRN: [id number] DOB: 10-09-1961  Admit date:     07/11/2024  Discharge date: 07/15/24  Discharge Physician: Lonni SHAUNNA Dalton   PCP: [name], DO     Recommendations at discharge:  Follow up with Neurology Dr. Margaret in 8 weeks for new seizure on new Keppra  Follow up with PCP Dr. Cloria in 1 week for hypertension on new Imdur /lisinopril  Dr. Cloria: Please check BMP in 1 week on new lisinopril  (discharge Cr 1.6, K 3.8) Recheck folate deficiency at appropriate interval on new supplement     Discharge Diagnoses: Principal Problem:   Seizure (HCC) Active Problems:   History of hemorrhagic stroke with residual hemiparesis (HCC)   Hyperlipidemia with target LDL less than 100   CAD, NATIVE VESSEL   Hydrocephalus (HCC)   Essential hypertension   Vascular dementia (HCC)   Chronic kidney disease, stage 3a (HCC)   Folate deficiency   B12 deficiency   Class I obesity, BMI 32   Hospital Course: Tony Davis. is a 63 y.o. 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     Seizure Patient was admitted in neurology were consulted.  CT head and MRI brain showed no new acute finding, just sequelae of old hemorrhagic stroke.  LTM EEG was obtained, showed no ongoing seizure activity.  Neurology recommended Keppra  500 twice daily and outpatient follow-up in 8 to 12 weeks.   Coronary artery disease Hypertension Hypertensive urgency History of hemorrhagic stroke Vascular dementia Patient was noted to be hypertensive.  His old Imdur  was restarted, and lisinopril  was added.  Creatinine remained  stable.  Recommend close follow-up with PCP for blood pressure check  B12 deficiency Level checked, now replete  Folate deficiency Folic acid  supplementation provided.  Recommend follow-up.  Chronic kidney disease stage IIIa History of renal cancer status post nephrectomy Renal function stable, needs to be rechecked on new lisinopril        The [place]  Controlled Substances Registry was reviewed for this patient prior to discharge.  Consultants: Neurology   Disposition: Home Diet recommendation:  Discharge Diet Orders (From admission, onward)     Start     Ordered   07/15/24 0000  Diet - low sodium heart healthy        07/15/24 0944             DISCHARGE MEDICATION: Allergies as of 07/15/2024       Reactions   Iodine Anaphylaxis   Norvasc  [amlodipine ] Swelling, Other (See Comments)   Foot edema   Aricept [donepezil] Other (See Comments)   Hallucinations        Medication List     TAKE these medications    acetaminophen  325 MG tablet Commonly known as: TYLENOL  Take 650 mg by mouth every 6 (six) hours as needed for moderate pain.   atorvastatin  80 MG tablet Commonly known as: LIPITOR  TAKE ONE TABLET BY MOUTH DAILY What changed: when to take this   escitalopram  20 MG tablet Commonly known as: LEXAPRO  Take 20 mg by mouth daily.   folic acid  1 MG tablet Commonly known as: FOLVITE  Take 1 tablet (1 mg total) by mouth daily. Start taking  on: July 16, 2024   hydrALAZINE  100 MG tablet Commonly known as: APRESOLINE  Take 100 mg by mouth 2 (two) times daily.   isosorbide  mononitrate 30 MG 24 hr tablet Commonly known as: IMDUR  Take 0.5 tablets (15 mg total) by mouth daily. Start taking on: July 16, 2024   levETIRAcetam  500 MG tablet Commonly known as: KEPPRA  Take 1 tablet (500 mg total) by mouth 2 (two) times daily.   lisinopril  40 MG tablet Commonly known as: ZESTRIL  Take 1 tablet (40 mg total) by mouth daily. Start taking on: July 16, 2024   melatonin 3 MG Tabs tablet Take 3 mg by mouth at bedtime.   memantine  10 MG tablet Commonly known as: NAMENDA  Take 10 mg by mouth 2 (two) times daily.   metoprolol  succinate 100 MG 24 hr tablet Commonly known as: TOPROL -XL Take 1 tablet (100 mg total) by mouth 2 (two) times daily. Take with or immediately following a meal.   pantoprazole  40 MG tablet Commonly known as: PROTONIX  Take 1 tablet (40 mg total) by mouth daily.   polyethylene glycol 17 g packet Commonly known as: MIRALAX / GLYCOLAX Take 17 g by mouth 2 (two) times daily as needed for moderate constipation.   rivastigmine  6 MG capsule Commonly known as: EXELON  Take 6 mg by mouth 2 (two) times daily.   Vitamin D3 25 MCG (1000 UT) Caps Take 1,000 Units by mouth daily.        Follow-up Information     Penumalli, Eduard SAUNDERS, MD. Schedule an appointment as soon as possible for a visit in 2 month(s).   Specialties: Neurology, Radiology Contact information: [address] [place] [address] [phone]         [name], DO. Schedule an appointment as soon as possible for a visit in 1 week(s).   Specialty: Geriatric Medicine Contact information: [address] [place] [address] [phone]                 Discharge Instructions     Ambulatory referral to Neurology   Complete by: As directed    An appointment is requested in approximately: 8 weeks Seizure hospital folow up   Diet - low sodium heart healthy   Complete by: As directed    Discharge instructions   Complete by: As directed    **IMPORTANT DISCHARGE INSTRUCTIONS**   From Dr. Jonel: You were admitted for a seizure  Here, we performed an EEG (electrical activity measurement of the brain) that showed no ongoing seizure storms  You had an MRI brain that showed no new concerning findings, just the scar of the old strokes, which are likely the thing provoking the seizure  See below for what to  do with a seizure  See below for driving restrictions  Take the anti-seizure medicine Keppra /levetiracetam  500 mg twice daily Go see your neurologist Dr. Margaret at [facility] in 8 weeks  Make sure he refills the Keppra  and discusses with you when to stop If you have side effects of sleepiness or irritability, call Dr. Coral office to discuss   Also, your blood pressure was high here, so we resumed your old Imdur  and started lisinopril  These are both blood pressure loweing medicines Take them once daily Go see your primary doctor in 1 week for a blood pressure check, make sure she refills these  Take the new medicine folate and ask your doctor to recheck your level   Increase activity slowly  Complete by: As directed        Discharge Exam: Filed Weights   07/11/24 2211  Weight: 108.9 kg    General: Pt is alert, awake, not in acute distress Cardiovascular: RRR, nl S1-S2, no murmurs appreciated.   No LE edema.   Respiratory: Normal respiratory rate and rhythm.  CTAB without rales or wheezes. Abdominal: Abdomen soft and non-tender.  No distension or HSM.   Neuro/Psych: Strength symmetric in upper and lower extremities.  Left eye droop chronic.  Judgment and insight appear at baseline.   Condition at discharge: fair  The results of significant diagnostics from this hospitalization (including imaging, microbiology, ancillary and laboratory) are listed below for reference.   Imaging Studies: MR BRAIN WO CONTRAST Result Date: 07/15/2024 CLINICAL DATA:  Seizure, new-onset, no history of trauma EXAM: MRI HEAD WITHOUT CONTRAST TECHNIQUE: Multiplanar, multiecho pulse sequences of the brain and surrounding structures were obtained without intravenous contrast. COMPARISON:  CT head 07/11/2024. FINDINGS: Artifact from the shunt catheter limits assessment. Within this limitation: Brain: No evidence of acute infarct, acute hemorrhage, mass lesion or midline  shift. T2 hypointensity and susceptibility artifact in the right cerebellum, compatible with the sequela of prior hemorrhage. Scattered T2/FLAIR hyperintensities in the white matter, compatible with chronic microvascular ischemic disease. Remote left basal ganglia hemorrhage. Similar size of the ventricular system with right frontal approach ventriculostomy catheter terminated in the right lateral ventricle. Vascular: Major arterial flow voids are maintained at the skull base. Skull and upper cervical spine: Normal marrow signal. Sinuses/Orbits: Clear sinuses.  No acute orbital findings. Other: No mastoid effusions. IMPRESSION: 1. No evidence of acute intracranial abnormality. 2. Unchanged size and configuration of the shunted ventricles. No hydrocephalus. 3. Moderate chronic microvascular ischemic disease. 4. Remote right cerebellar and left basal ganglia hemorrhages. Electronically Signed   By: Gilmore GORMAN Molt M.D.   On: 07/15/2024 00:23   Discontinue Long Term Monitor EEG Result Date: 07/13/2024 Maurine Jurist, MD     07/13/2024 11:55 AM Continuous Video-EEG Monitoring Report CPT/Type of Study: 95720 (EEG with video, 12-26 hours)  Indications for Procedure: Altered mental status Primary neurological diagnosis: G93.40 Duration of study: 07/12/2024 07:30 to 07/13/2024 07:30 History: This is a 63 year old male presenting with altered mental status. Continuous video-EEG monitoring was performed to evaluate for seizure. Technical Description: The EEG was performed using standard setting per the guidelines of American Clinical Neurophysiology Society (ACNS). A minimum of 21 electrodes were placed on scalp according to the International 10-20 or/and 10-10 Systems. Supplemental electrodes were placed as needed. Single EKG electrode was also used to detect cardiac arrhythmia. Patient's behavior was continuously recorded on video simultaneously with EEG. A minimum of 16 channels were used for data display. Each epoch of study  was reviewed manually daily and as needed using standard referential and bipolar montages. Computerized quantitative EEG analysis (such as compressed spectral array analysis, trending, automated spike & seizure detection) were used as indicated. EEG Description: Epoch: 07/12/2024 07:30 to 07/13/2024 07:30 Background: During wakefulness, there was a bilateral reactive 8 Hz posterior dominant rhythm. Intermittent background theta slowing was present. During drowsiness, slow roving eye movement, vertex waves and generalized slowing were seen. K complexes and spindles were present in stage II sleep. PERIODIC OR RHYTHMIC PATTERNS: None. EPILEPTIFORM DISCHARGES: None. SEIZURES: None. EVENTS: None. EKG: No significant arrhythmia. NOTE: Video-EEG was degraded after 02:42 on 07/13/2024 because patient removed EEG leads. Impression: This is an abnormal EEG due to the presence of intermittent background theta slowing, suggesting mild encephalopathy, which  is nonspecific as to etiology. No epileptiform discharge or seizure is present.     Overnight EEG with video Result Date: 07/12/2024 [name], [name], MD     07/12/2024  9:23 AM Patient Name: Carlin JINNY Charmayne Mickey. MRN: [id number] Epilepsy Attending: Pastor Falling Referring Physician/Provider: No ref. provider found     Date: 07/12/2024 Start time: 07/11/2024 at 2334 End Time: 07/12/2024 at 0730 Duration: 8 hours and 4 minutes Patient history: 63 y.o. male with hx of previous ICH, hydrocephalus status post VP shunt, vascular dementia, history of DVT, who presented to the emergency department for an episode of unresponsiveness. EEG to evaluate for seizure Level of alertness: lethargic AEDs during EEG study: LEV Technical aspects: This EEG study was done with scalp electrodes positioned according to the 10-20 International system of electrode placement. Electrical activity was reviewed with band pass filter of 1-70Hz , sensitivity of 7 uV/mm, display speed of 62mm/sec with a 60Hz   notched filter applied as appropriate. EEG data were recorded continuously and digitally stored.  Video monitoring was available and reviewed as appropriate. Description: EEG showed continuous/intermittent generalized delta-theta generalized slowing. Hyperventilation and photic stimulation were not performed.   ABNORMALITY - Continuous slow, generalized IMPRESSION: This study is suggestive of moderate diffuse encephalopathy, nonspecific etiology but likely related to sedation, toxic-metabolic etiology. No seizures or epileptiform discharges were seen throughout the recording. Pastor Falling MD Neurology    CT HEAD CODE STROKE WO CONTRAST Result Date: 07/11/2024 EXAM: CT HEAD WITHOUT CONTRAST 07/11/2024 10:27:16 PM TECHNIQUE: CT of the head was performed without the administration of intravenous contrast. Automated exposure control, iterative reconstruction, and/or weight based adjustment of the mA/kV was utilized to reduce the radiation dose to as low as reasonably achievable. COMPARISON: 09/21/2023 CLINICAL HISTORY: Neuro deficit, acute, stroke suspected. FINDINGS: BRAIN AND VENTRICLES: No acute hemorrhage. ASPECTS is 10. Size and configuration of the ventricles are unchanged. Right frontal approach shunt catheter with tip near the right foramen of Monroe. ORBITS: No acute abnormality. SINUSES: No acute abnormality. SOFT TISSUES AND SKULL: No acute soft tissue abnormality. No skull fracture. IMPRESSION: 1. No acute intracranial abnormality. 2. Right frontal approach shunt catheter with tip near the right foramen of Monroe. Unchanged size and configuration of the ventricles. 3. ASPECTS is 10 4. Findings communicated to Dr. Michaela at 10:31 pm Electronically signed by: Franky Stanford MD 07/11/2024 10:35 PM EDT RP Workstation: [id number]    Microbiology: Results for orders placed or performed during the hospital encounter of 09/19/23  MRSA Next Gen by PCR, Nasal     Status: None   Collection Time: 09/20/23   1:48 AM   Specimen: Nasal Mucosa; Nasal Swab  Result Value Ref Range Status   MRSA by PCR Next Gen NOT DETECTED NOT DETECTED Final    Comment: (NOTE) The GeneXpert MRSA Assay (FDA approved for NASAL specimens only), is one component of a comprehensive MRSA colonization surveillance program. It is not intended to diagnose MRSA infection nor to guide or monitor treatment for MRSA infections. Test performance is not FDA approved in patients less than 40 years old. Performed at [facility], [address] [place], [place] [address]     Labs: CBC: Recent Labs  Lab 07/11/24 2217 07/11/24 2220 07/12/24 0433  WBC 6.8  --  6.9  NEUTROABS 4.2  --   --   HGB 14.7 15.0 13.8  HCT 45.4 44.0 43.7  MCV 95.4  --  95.8  PLT 237  --  233   Basic Metabolic Panel:  Recent Labs  Lab 07/11/24 2217 07/11/24 2220 07/12/24 0433 07/13/24 0455 07/15/24 1047  NA 142 143 142 140 139  K 3.8 3.6 3.4* 3.8 3.7  CL 104 103 104 107 103  CO2 29  --  29 25 30   GLUCOSE 124* 124* 85 99 131*  BUN 18 19 16 20  23*  CREATININE 1.62* 1.70* 1.60* 1.62* 1.84*  CALCIUM  9.2  --  8.9 9.1 9.2  MG  --   --  1.9  --   --    Liver Function Tests: Recent Labs  Lab 07/11/24 2217  AST 32  ALT 24  ALKPHOS 102  BILITOT 0.9  PROT 7.0  ALBUMIN  3.8   CBG: Recent Labs  Lab 07/11/24 2208  GLUCAP 122*    Discharge time spent: approximately 45 minutes spent on discharge counseling, evaluation of patient on day of discharge, and coordination of discharge planning with nursing, social work, pharmacy and case management  Signed: Lonni SHAUNNA Dalton, MD [facility] 07/15/2024

## 2024-07-15 NOTE — Progress Notes (Deleted)
 Pend MD order reconciliation; pend lab results

## 2024-07-15 NOTE — TOC Transition Note (Signed)
 Transition of Care [facility]) - Discharge Note   Patient Details  Name: Tony Davis. MRN: [id number] Date of Birth: 03/07/1961  Transition of Care [facility]) CM/SW Contact:  Andrez JULIANNA George, RN Phone Number: 07/15/2024, 9:55 AM   Clinical Narrative:     Pt is discharging home with spouse. Pt is active with [facility] and his SW: Alan is aware of d/c home. She is also aware of recommended increase in therapy recommendations.  [facility] is also aware of wife requesting wheelchair for home.  Wife is providing transport home.  Final next level of care: Home w Home Health Services (therapy through [facility]) Barriers to Discharge: No Barriers Identified   Patient Goals and CMS Choice            Discharge Placement                       Discharge Plan and Services Additional resources added to the After Visit Summary for     Discharge Planning Services: CM Consult                      HH Arranged: PT, OT HH Agency:  ([facility]) Date HH Agency Contacted: 07/15/24   Representative spoke with at [facility] Agency: Alan at [employer] [facility] (SDOH) Interventions SDOH Screenings   Food Insecurity: No Food Insecurity (07/12/2024)  Housing: Low Risk  (07/12/2024)  Transportation Needs: No Transportation Needs (07/12/2024)  Utilities: Not At Risk (07/12/2024)  Tobacco Use: Medium Risk (07/11/2024)     Readmission Risk Interventions     No data to display

## 2024-07-16 LAB — VITAMIN B1: Vitamin B1 (Thiamine): 95.7 nmol/L (ref 66.5–200.0)

## 2024-08-27 ENCOUNTER — Other Ambulatory Visit: Payer: Self-pay | Admitting: Vascular Surgery

## 2024-08-27 DIAGNOSIS — Z122 Encounter for screening for malignant neoplasm of respiratory organs: Secondary | ICD-10-CM

## 2024-08-31 ENCOUNTER — Encounter (HOSPITAL_COMMUNITY): Payer: Self-pay

## 2024-08-31 ENCOUNTER — Other Ambulatory Visit: Payer: Self-pay

## 2024-08-31 ENCOUNTER — Emergency Department (HOSPITAL_COMMUNITY)
Admission: EM | Admit: 2024-08-31 | Discharge: 2024-09-01 | Disposition: A | Discharge status: Home / Self Care | Payer: Medicare (Managed Care) | Attending: Emergency Medicine | Admitting: Emergency Medicine

## 2024-08-31 DIAGNOSIS — I2510 Atherosclerotic heart disease of native coronary artery without angina pectoris: Secondary | ICD-10-CM | POA: Diagnosis not present

## 2024-08-31 DIAGNOSIS — R4182 Altered mental status, unspecified: Secondary | ICD-10-CM | POA: Diagnosis present

## 2024-08-31 DIAGNOSIS — I159 Secondary hypertension, unspecified: Secondary | ICD-10-CM | POA: Diagnosis not present

## 2024-08-31 NOTE — ED Triage Notes (Signed)
 Arrives [facility] from home with slurred speech, AMS and increased weakness bilaterally. Oriented to self only.   Last known well 6am after riding the bus home. Suspected sx noticed between 6am and 11am but family contributed it to previous stroke.   Baseline he's able to ambulate with walker but family noticed he is requiring much more assistance.   Known left sided weakness from previous CVA.   No anticoagulants as previous CVA were hemorrhagic.

## 2024-09-01 ENCOUNTER — Emergency Department (HOSPITAL_COMMUNITY): Payer: Medicare (Managed Care)

## 2024-09-01 LAB — URINALYSIS, ROUTINE W REFLEX MICROSCOPIC
Bilirubin Urine: NEGATIVE
Glucose, UA: NEGATIVE mg/dL
Ketones, ur: NEGATIVE mg/dL
Leukocytes,Ua: NEGATIVE
Nitrite: NEGATIVE
Protein, ur: NEGATIVE mg/dL
RBC / HPF: >50 RBC/hpf (ref 0–5)
Specific Gravity, Urine: 1.01 (ref 1.005–1.030)
pH: 7 (ref 5.0–8.0)

## 2024-09-01 LAB — COMPREHENSIVE METABOLIC PANEL WITH GFR
ALT: 18 U/L (ref 0–44)
AST: 25 U/L (ref 15–41)
Albumin: 3.6 g/dL (ref 3.5–5.0)
Alkaline Phosphatase: 103 U/L (ref 38–126)
Anion gap: 10 (ref 5–15)
BUN: 14 mg/dL (ref 6–20)
CO2: 28 mmol/L (ref 22–32)
Calcium: 9.2 mg/dL (ref 8.9–10.3)
Chloride: 102 mmol/L (ref 98–111)
Creatinine, Ser: 1.81 mg/dL — ABNORMAL HIGH (ref 0.61–1.24)
GFR, Estimated: 42 mL/min — ABNORMAL LOW (ref >60)
Glucose, Bld: 100 mg/dL — ABNORMAL HIGH (ref 70–99)
Potassium: 3.5 mmol/L (ref 3.5–5.1)
Sodium: 140 mmol/L (ref 135–145)
Total Bilirubin: 0.8 mg/dL (ref 0.0–1.2)
Total Protein: 6.6 g/dL (ref 6.5–8.1)

## 2024-09-01 LAB — CBC WITH DIFFERENTIAL/PLATELET
Abs Immature Granulocytes: 0.01 K/uL (ref 0.00–0.07)
Basophils Absolute: 0 K/uL (ref 0.0–0.1)
Basophils Relative: 1 %
Eosinophils Absolute: 0.2 K/uL (ref 0.0–0.5)
Eosinophils Relative: 4 %
HCT: 41.4 % (ref 39.0–52.0)
Hemoglobin: 13.3 g/dL (ref 13.0–17.0)
Immature Granulocytes: 0 %
Lymphocytes Relative: 31 %
Lymphs Abs: 1.8 K/uL (ref 0.7–4.0)
MCH: 30.4 pg (ref 26.0–34.0)
MCHC: 32.1 g/dL (ref 30.0–36.0)
MCV: 94.5 fL (ref 80.0–100.0)
Monocytes Absolute: 0.4 K/uL (ref 0.1–1.0)
Monocytes Relative: 7 %
Neutro Abs: 3.3 K/uL (ref 1.7–7.7)
Neutrophils Relative %: 57 %
Platelets: 214 K/uL (ref 150–400)
RBC: 4.38 MIL/uL (ref 4.22–5.81)
RDW: 12.4 % (ref 11.5–15.5)
WBC: 5.8 K/uL (ref 4.0–10.5)
nRBC: 0 % (ref 0.0–0.2)

## 2024-09-01 LAB — TROPONIN I (HIGH SENSITIVITY)
Troponin I (High Sensitivity): 26 ng/L — ABNORMAL HIGH (ref <18)
Troponin I (High Sensitivity): 27 ng/L — ABNORMAL HIGH (ref <18)

## 2024-09-01 LAB — RAPID URINE DRUG SCREEN, HOSP PERFORMED
Amphetamines: NOT DETECTED
Barbiturates: NOT DETECTED
Benzodiazepines: NOT DETECTED
Cocaine: NOT DETECTED
Opiates: NOT DETECTED
Tetrahydrocannabinol: NOT DETECTED

## 2024-09-01 LAB — ETHANOL: Alcohol, Ethyl (B): <15 mg/dL (ref <15)

## 2024-09-01 MED ORDER — METOPROLOL TARTRATE 25 MG PO TABS: 100.0000 mg | ORAL_TABLET | Freq: Once | ORAL | Status: DC

## 2024-09-01 MED ORDER — LISINOPRIL 20 MG PO TABS
40.0000 mg | ORAL_TABLET | Freq: Every day | ORAL | Status: DC
  Administered 2024-09-01: 40 mg via ORAL
  Filled 2024-09-01: qty 2

## 2024-09-01 MED ORDER — HYDRALAZINE HCL 50 MG PO TABS
100.0000 mg | ORAL_TABLET | Freq: Two times a day (BID) | ORAL | Status: DC
Start: 2024-09-01 — End: 2024-09-01
  Administered 2024-09-01: 100 mg via ORAL
  Filled 2024-09-01: qty 2

## 2024-09-01 MED ORDER — LEVETIRACETAM (KEPPRA) 500 MG/5 ML ADULT IV PUSH
1000.0000 mg | Freq: Once | INTRAVENOUS | Status: AC
  Administered 2024-09-01: 1000 mg via INTRAVENOUS
  Filled 2024-09-01: qty 10

## 2024-09-01 MED ORDER — METOPROLOL SUCCINATE ER 25 MG PO TB24
100.0000 mg | ORAL_TABLET | Freq: Every day | ORAL | Status: DC
Start: 2024-09-01 — End: 2024-09-01
  Administered 2024-09-01: 100 mg via ORAL
  Filled 2024-09-01: qty 4

## 2024-09-01 NOTE — ED Provider Notes (Signed)
 [place] EMERGENCY DEPARTMENT AT [facility] Provider Note   CSN: [id number] Arrival date & time: 08/31/24  2351     Patient presents with: Altered Mental Status   Tony Davis. is a 63 y.o. male.   HPI     This is a 63 year old male who presents with concern for altered mental status.  Initial history limited.  Patient states that he is relatively asymptomatic.  He is oriented to himself and place but not time.  Per EMS report, family called out for altered mental status.  States that he appeared to have gait disturbance and was not acting normal.  History of hemorrhagic CVA.  Also recent history of seizure diagnosis for which he is on Keppra .  Wife now at bedside.  States that normally he is oriented x 3.  States that his eyes start looking in all directions when something is wrong.  She states that this happened when he had a seizure previously. Prior to Admission medications   Medication Sig Start Date End Date Taking? Authorizing Provider  acetaminophen  (TYLENOL ) 325 MG tablet Take 650 mg by mouth every 6 (six) hours as needed for moderate pain.    [provider]  atorvastatin  (LIPITOR ) 80 MG tablet TAKE ONE TABLET BY MOUTH DAILY Patient taking differently: Take 80 mg by mouth at bedtime. 08/16/18   Joshua Debby CROME, MD  Cholecalciferol (VITAMIN D3) 25 MCG (1000 UT) CAPS Take 1,000 Units by mouth daily.    [provider]  escitalopram  (LEXAPRO ) 20 MG tablet Take 20 mg by mouth daily.    [provider]  folic acid  (FOLVITE ) 1 MG tablet Take 1 tablet (1 mg total) by mouth daily. 07/16/24   Danford, Lonni SQUIBB, MD  hydrALAZINE  (APRESOLINE ) 100 MG tablet Take 100 mg by mouth 2 (two) times daily.    [provider]  isosorbide  mononitrate (IMDUR ) 30 MG 24 hr tablet Take 0.5 tablets (15 mg total) by mouth daily. 07/16/24   Danford, Lonni SQUIBB, MD  levETIRAcetam  (KEPPRA ) 500 MG tablet Take 1 tablet (500 mg total) by mouth 2 (two)  times daily. 07/15/24   Danford, Lonni SQUIBB, MD  lisinopril  (ZESTRIL ) 40 MG tablet Take 1 tablet (40 mg total) by mouth daily. 07/16/24   Danford, Lonni SQUIBB, MD  melatonin 3 MG TABS tablet Take 3 mg by mouth at bedtime.    [provider]  memantine  (NAMENDA ) 10 MG tablet Take 10 mg by mouth 2 (two) times daily. 10/30/21   [provider]  metoprolol  succinate (TOPROL -XL) 100 MG 24 hr tablet Take 1 tablet (100 mg total) by mouth 2 (two) times daily. Take with or immediately following a meal. 12/25/21   Verlin Lonni BIRCH, MD  pantoprazole  (PROTONIX ) 40 MG tablet Take 1 tablet (40 mg total) by mouth daily. 09/27/23   Ghimire, Donalda HERO, MD  polyethylene glycol (MIRALAX / GLYCOLAX) 17 g packet Take 17 g by mouth 2 (two) times daily as needed for moderate constipation.    [provider]  rivastigmine  (EXELON ) 6 MG capsule Take 6 mg by mouth 2 (two) times daily. 11/11/21   [provider]    Allergies: Iodine, Norvasc  [amlodipine ], and Aricept [donepezil]    Review of Systems  Constitutional:  Negative for fever.  Respiratory:  Negative for shortness of breath.   Cardiovascular:  Negative for chest pain.  Gastrointestinal:  Negative for abdominal pain.  Neurological:  Negative for dizziness.  All other systems reviewed and are negative.  Updated Vital Signs BP (!) 169/100   Pulse 65   Temp 98.1 F (36.7 C) (Oral)   Resp 16   Ht 1.829 m (6')   Wt 108.9 kg   SpO2 100%   BMI 32.55 kg/m   Physical Exam Vitals and nursing note reviewed.  Constitutional:      Appearance: He is well-developed.  HENT:     Head: Normocephalic and atraumatic.     Nose: Nose normal.  Eyes:     Pupils: Pupils are equal, round, and reactive to light.     Comments: Slightly disconjugate gaze with proptosis  Cardiovascular:     Rate and Rhythm: Normal rate and regular rhythm.     Heart sounds: Normal heart sounds. No murmur heard. Pulmonary:     Effort:  Pulmonary effort is normal. No respiratory distress.     Breath sounds: Normal breath sounds. No wheezing.  Abdominal:     General: Bowel sounds are normal.     Palpations: Abdomen is soft.     Tenderness: There is no abdominal tenderness. There is no rebound.  Musculoskeletal:     Cervical back: Neck supple.  Lymphadenopathy:     Cervical: No cervical adenopathy.  Skin:    General: Skin is warm and dry.  Neurological:     Mental Status: He is alert.     Comments: Oriented x 2, 5 out of 5 strength in all 4 extremities, no dysmetria to finger-nose-finger, cranial nerves II through XII intact  Psychiatric:        Mood and Affect: Mood normal.     (all labs ordered are listed, but only abnormal results are displayed) Labs Reviewed  COMPREHENSIVE METABOLIC PANEL WITH GFR - Abnormal; Notable for the following components:      Result Value   Glucose, Bld 100 (*)    Creatinine, Ser 1.81 (*)    GFR, Estimated 42 (*)    All other components within normal limits  URINALYSIS, ROUTINE W REFLEX MICROSCOPIC - Abnormal; Notable for the following components:   Hgb urine dipstick MODERATE (*)    Bacteria, UA RARE (*)    All other components within normal limits  TROPONIN I (HIGH SENSITIVITY) - Abnormal; Notable for the following components:   Troponin I (High Sensitivity) 26 (*)    All other components within normal limits  TROPONIN I (HIGH SENSITIVITY) - Abnormal; Notable for the following components:   Troponin I (High Sensitivity) 27 (*)    All other components within normal limits  CBC WITH DIFFERENTIAL/PLATELET  ETHANOL  RAPID URINE DRUG SCREEN, HOSP PERFORMED    EKG: EKG Interpretation Date/Time:  Monday August 31 2024 23:57:15 EDT Ventricular Rate:  55 PR Interval:  51 QRS Duration:  98 QT Interval:  469 QTC Calculation: 449 R Axis:   -29  Text Interpretation: Sinus rhythm Short PR interval Borderline left axis deviation Minimal ST depression Confirmed by Bari Pfeiffer ([id number]) on 09/01/2024 12:57:35 AM  Radiology: MR BRAIN WO CONTRAST Result Date: 09/01/2024 EXAM: MRI BRAIN WITHOUT CONTRAST 09/01/2024 05:05:00 AM TECHNIQUE: Multiplanar multisequence MRI of the head/brain was performed without the administration of intravenous contrast. COMPARISON: CT of the head dated 09/01/2024 and MRI of the head dated 07/14/2024. CLINICAL HISTORY: Mental status change, unknown cause; r/o press. FINDINGS: BRAIN AND VENTRICLES: No acute infarct. No mass. No midline shift. No hydrocephalus. The sella is unremarkable. Normal flow voids. There is substantial artifact caused by the reservoir of a right frontal approach ventriculoperitoneal shunt. The tip  of the catheter remains within the body of the right lateral ventricle. There is moderate periventricular white matter disease present. There is blooming artifact present within the left basal ganglia and within the right cerebellar hemisphere representing hemosiderin staining from remote intraparenchymal hemorrhages. There is no significant change in the ventricular systems since the previous MRI. ORBITS: No acute abnormality. SINUSES AND MASTOIDS: No acute abnormality. BONES AND SOFT TISSUES: Normal marrow signal. No acute soft tissue abnormality. IMPRESSION: 1. No acute findings. 2. Moderate periventricular white matter disease. 3. Blooming artifact in the left basal ganglia and right cerebellar hemisphere, consistent with hemosiderin staining from remote intraparenchymal hemorrhages. 4. Substantial artifact from the right frontal ventriculoperitoneal shunt reservoir. The tip of the catheter remains within the body of the right lateral ventricle. No significant change in the ventricular systems since the previous MRI. Electronically signed by: Evalene Coho MD 09/01/2024 05:20 AM EDT RP Workstation: [id number]   DG Chest Portable 1 View Result Date: 09/01/2024 EXAM: 1 VIEW XRAY OF THE CHEST 09/01/2024 12:45:46 AM COMPARISON: CT  chest dated 11/01/2023. CLINICAL HISTORY: AMS; CP. GC-EMS from home with slurred speech, AMS; CP. GC-EMS from home with slurred speech. FINDINGS: LINES, TUBES AND DEVICES: VP shunt coursing along the right hemithorax. LUNGS AND PLEURA: No focal pulmonary opacity. No pulmonary edema. No pleural effusion. No pneumothorax. HEART AND MEDIASTINUM: No acute abnormality of the cardiac and mediastinal silhouettes. BONES AND SOFT TISSUES: No acute osseous abnormality. IMPRESSION: 1. No acute cardiopulmonary process. Electronically signed by: Pinkie Pebbles MD 09/01/2024 12:51 AM EDT RP Workstation: [id number]   CT Head Wo Contrast Result Date: 09/01/2024 CLINICAL DATA:  Mental status change, unknown cause EXAM: CT HEAD WITHOUT CONTRAST TECHNIQUE: Contiguous axial images were obtained from the base of the skull through the vertex without intravenous contrast. RADIATION DOSE REDUCTION: This exam was performed according to the departmental dose-optimization program which includes automated exposure control, adjustment of the mA and/or kV according to patient size and/or use of iterative reconstruction technique. COMPARISON:  CT head 07/11/2024 FINDINGS: Brain: No evidence of acute infarction, hemorrhage, hydrocephalus, extra-axial collection or mass lesion/mass effect. Unchanged white matter hypodensities. Unchanged position of a right frontal approach ventricular shunt catheter. Stable ventricular size. Vascular: Calcific atherosclerosis.  No hyperdense vessel. Skull: No acute fracture. Sinuses/Orbits: Clear sinuses.  No acute orbital findings. Other: No mastoid effusions. IMPRESSION: No evidence of acute intracranial abnormality. Electronically Signed   By: Gilmore GORMAN Molt M.D.   On: 09/01/2024 00:47     Procedures   Medications Ordered in the ED  hydrALAZINE  (APRESOLINE ) tablet 100 mg (100 mg Oral Given 09/01/24 0530)  lisinopril  (ZESTRIL ) tablet 40 mg (has no administration in time range)  metoprolol   succinate (TOPROL -XL) 24 hr tablet 100 mg (has no administration in time range)  levETIRAcetam  (KEPPRA ) undiluted injection 1,000 mg (1,000 mg Intravenous Given 09/01/24 0530)    Clinical Course as of 09/01/24 0628  Tue Sep 01, 2024  0402 Per wife, patient did not get nighttime medications.  Will dose his blood pressure meds.  Will obtain MRI.  Does have some disconjugate gaze which wife states is only they are when something is happening or he is having a stroke.. [CH]  [phone] Spoke with the wife.  MRI is negative.  Overall largely reassuring workup.  Patient could have had a seizure at home and then postictal causing his symptoms.  Continue seizure medications and follow-up with neurology.  He was given his home blood pressure medications. [CH]    Clinical Course User  Index [CH] Edina Winningham, Charmaine FALCON, MD                                 Medical Decision Making Amount and/or Complexity of Data Reviewed Labs: ordered. Radiology: ordered.  Risk Prescription drug management.   This patient presents to the ED for concern of altered mental status, this involves an extensive number of treatment options, and is a complaint that carries with it a high risk of complications and morbidity.  I considered the following differential and admission for this acute, potentially life threatening condition.  The differential diagnosis includes encephalopathy, seizure, syncope, CVA, metabolic derangement, medications  MDM:    This is a 63 year old male who presents with reported altered mental status.  Nontoxic.  He has no complaints.  Did have progressively elevated blood pressures while in the emergency department.  Recent diagnosis of seizures.  He is awake and alert.  Does have some disconjugate gaze and oriented x 2 But otherwise neurologically is intact.  Labs obtained.  CBC, CMP, urinalysis all reassuring.  Troponin flat at 26 and 27.  Patient does have a baseline renal insufficiency which could explain  this.  CT head is reassuring.  Patient is afebrile.  Shunt appears in place.  Low suspicion for shunt infection.  MRI does not show any evidence of acute stroke but does show sequela of prior hemorrhagic stroke.  Overall workup reassuring.  He was loaded with Keppra  and given his home blood pressure medication.  MRI also does not show any evidence of PRESS.  Discussed with wife.  She is comfortable taking him home.  He slowly has returned back to his baseline. (Labs, imaging, consults)  Labs: I Ordered, and personally interpreted labs.  The pertinent results include: CBC, CMP, urinalysis, UDS, troponin  Imaging Studies ordered: I ordered imaging studies including CT head, MRI I independently visualized and interpreted imaging. I agree with the radiologist interpretation  Additional history obtained from wife and chart review.  External records from outside source obtained and reviewed including prior evaluations  Cardiac Monitoring: The patient was maintained on a cardiac monitor.  If on the cardiac monitor, I personally viewed and interpreted the cardiac monitored which showed an underlying rhythm of: Sinus  Reevaluation: After the interventions noted above, I reevaluated the patient and found that they have :improved  Social Determinants of Health:  lives with wife  Disposition: Discharge  Co morbidities that complicate the patient evaluation  Past Medical History:  Diagnosis Date   Acute respiratory failure (HCC) 2015   a. Following BG hemorrhage - s/p trache with decannulation.   Basal ganglia hemorrhage (HCC) 2015   a. 2015 - with intraventricular extension and hydrocephalus requiring right crani and VP shunt, complicated by encephalopathy, protein calorie malnutrition, acute resp failure req trache and PEG, BLE DVT s/p IVC, enterobacter PNA, subsequent right hemiparesis and difficulty communicating.   BPH (benign prostatic hyperplasia)    CAD (coronary artery disease)    a.  s/p promus DES circumflex artery 10/13/10. b. Cath 04/2013: stable disease but possible small vessel disease -Imdur  added.   Carotid artery disease    a. 1-29% BICA by study 04/2014.   CVA (cerebral vascular accident) (HCC) 02/2010   a. CVA 2011, prior reported TIAs. b. CNS hemorrhage 2015.   CVA (cerebral vascular accident) (HCC) 06/2014   hemorrhagic; in [place] ; brain activity diminshed; ability to do anything; can't do ADLs on own  DVT (deep venous thrombosis) (HCC) 2013   a. 2013. b. 2015 - reported BLE DVT s/p IVC filter.    Enterobacter cloacae pneumonia (HCC) 2015   Falls    Folate deficiency anemia    GERD (gastroesophageal reflux disease)    Hematuria 07/2011   History of kidney stones    HLD (hyperlipidemia)    HTN (hypertension)    Hydrocephalus (HCC) 2015   a. 2/2 BG hemorrhage.   Incontinence of feces    Lower extremity edema    LV dysfunction    a. 2011 - EF 45-50%, subsequent 55-60% in 04/2014.   Noncompliance    Orthostatic hypotension    OSA on CPAP    not using CPAP right now (11/30/2014)   PFO (patent foramen ovale)    a. echo 8/12: EF 55-65%, grade 2 diast dysfnx; +PFO on bubble study. b. TEE 4/13: EF normal, atrial septum with suspicion for interatrial septum fenestrations without flow across and few large bubbles noted in LA.  This was not felt to require closure    Protein calorie malnutrition    Rheumatoid arthritis(714.0)    S/P percutaneous endoscopic gastrostomy (PEG) tube placement (HCC) 2015   TIA (transient ischemic attack)    1-2; both after the one in 02/2010 (09/25/2013)   Urinary incontinence      Medicines Meds ordered this encounter  Medications   levETIRAcetam  (KEPPRA ) undiluted injection 1,000 mg   hydrALAZINE  (APRESOLINE ) tablet 100 mg   lisinopril  (ZESTRIL ) tablet 40 mg   DISCONTD: metoprolol  tartrate (LOPRESSOR ) tablet 100 mg   metoprolol  succinate (TOPROL -XL) 24 hr tablet 100 mg    I have reviewed the patients home medicines  and have made adjustments as needed  Problem List / ED Course: Problem List Items Addressed This Visit       Other   Altered mental status - Primary   Other Visit Diagnoses       Secondary hypertension       Relevant Medications   hydrALAZINE  (APRESOLINE ) tablet 100 mg   lisinopril  (ZESTRIL ) tablet 40 mg (Start on 09/01/2024  6:30 AM)   metoprolol  succinate (TOPROL -XL) 24 hr tablet 100 mg (Start on 09/01/2024  6:30 AM)                Final diagnoses:  Altered mental status, unspecified altered mental status type  Secondary hypertension    ED Discharge Orders     None          Jenise Iannelli, Charmaine FALCON, MD 09/01/24 [phone]

## 2024-09-01 NOTE — ED Notes (Signed)
 Pt's wife just called. She stated that he is still recovering from an hemorrhagic stroke from 2015, but he is typically A&oX4. She reported [place] and when he got off of the bus he was not walking well. While she was getting him ready for bed she noticed that he was disoriented then started vomiting. Notified MD.

## 2024-09-01 NOTE — ED Notes (Signed)
 MD notified of pt BP being elevated.

## 2024-09-01 NOTE — Discharge Instructions (Addendum)
 You were seen today for altered mental status.  You could have had a seizure at home that made you confused and postictal.  Continue seizure medication and follow-up with neurology.  Your blood pressure was noted to be elevated.  Make sure to continue home medications.  Other workup is reassuring.

## 2024-09-10 ENCOUNTER — Other Ambulatory Visit: Payer: Medicare (Managed Care)

## 2024-09-15 ENCOUNTER — Institutional Professional Consult (permissible substitution): Admitting: Diagnostic Neuroimaging

## 2024-10-12 ENCOUNTER — Encounter: Payer: Self-pay | Admitting: Radiology

## 2024-10-13 ENCOUNTER — Ambulatory Visit: Payer: Medicare (Managed Care) | Admitting: Nurse Practitioner

## 2024-10-20 ENCOUNTER — Other Ambulatory Visit (HOSPITAL_BASED_OUTPATIENT_CLINIC_OR_DEPARTMENT_OTHER): Payer: Self-pay

## 2024-10-30 ENCOUNTER — Other Ambulatory Visit: Payer: Self-pay | Admitting: Urology

## 2024-11-03 ENCOUNTER — Other Ambulatory Visit: Payer: Medicare (Managed Care)

## 2024-11-25 ENCOUNTER — Other Ambulatory Visit: Payer: Medicare (Managed Care)

## 2024-11-25 ENCOUNTER — Inpatient Hospital Stay
Admission: RE | Admit: 2024-11-25 | Discharge: 2024-11-25 | Payer: Medicare (Managed Care) | Attending: Vascular Surgery | Admitting: Vascular Surgery

## 2024-11-25 DIAGNOSIS — Z122 Encounter for screening for malignant neoplasm of respiratory organs: Secondary | ICD-10-CM

## 2024-11-26 NOTE — Patient Instructions (Addendum)
 SURGICAL WAITING ROOM VISITATION Patients having surgery or a procedure may have no more than 2 support people in the waiting area - these visitors may rotate in the visitor waiting room.   Due to an increase in RSV and influenza rates and associated hospitalizations, children ages 36 and under may not visit patients in [facility] hospitals. If the patient needs to stay at the hospital during part of their recovery, the visitor guidelines for inpatient rooms apply.  PRE-OP VISITATION  Pre-op nurse will coordinate an appropriate time for 1 support person to accompany the patient in pre-op.  This support person may not rotate.  This visitor will be contacted when the time is appropriate for the visitor to come back in the pre-op area.  Please refer to the [facility] website for the visitor guidelines for Inpatients (after your surgery is over and you are in a regular room).  You are not required to quarantine at this time prior to your surgery. However, you must do this: Hand Hygiene often Do NOT share personal items Notify your provider if you are in close contact with someone who has COVID or you develop fever 100.4 or greater, new onset of sneezing, cough, sore throat, shortness of breath or body aches.  If you test positive for Covid or have been in contact with anyone that has tested positive in the last 10 days please notify you surgeon.    Your procedure is scheduled on:  12/17/24  Report to [facility] Main Entrance: [place] entrance where the [employer] is available.   Report to admitting at: 5:15 AM  Call this number if you have any questions or problems the morning of surgery [phone]  FOLLOW ANY ADDITIONAL PRE OP INSTRUCTIONS YOU RECEIVED FROM YOUR SURGEON'S OFFICE!!!  Do not eat food or drink fluids after Midnight the night prior to your surgery/procedure.   Oral Hygiene is also important to reduce your risk of infection.        Remember - BRUSH YOUR TEETH  THE MORNING OF SURGERY WITH YOUR REGULAR TOOTHPASTE  Do NOT smoke after Midnight the night before surgery.  STOP TAKING all Vitamins, Herbs and supplements 1 week before your surgery.   Take ONLY these medicines the morning of surgery with A SIP OF WATER : isosorbide ,kepra,escitalopram ,hydralazine ,metoprolol ,memantine ,rivastigmine ,pantoprazole .                   You may not have any metal on your body including hair pins, jewelry, and body piercing  Do not wear lotions, powders, perfumes / cologne, or deodorant  Men may shave face and neck.  Contacts, Hearing Aids, dentures or bridgework may not be worn into surgery. DENTURES WILL BE REMOVED PRIOR TO SURGERY PLEASE DO NOT APPLY Poly grip OR ADHESIVES!!!  You may bring a small overnight bag with you on the day of surgery, only pack items that are not valuable. [facility] IS NOT RESPONSIBLE   FOR VALUABLES THAT ARE LOST OR STOLEN.   Patients discharged on the day of surgery will not be allowed to drive home.  Someone NEEDS to stay with you for the first 24 hours after anesthesia.  Do not bring your home medications to the hospital. The Pharmacy will dispense medications listed on your medication list to you during your admission in the Hospital.  Special Instructions: Bring a copy of your healthcare power of attorney and living will documents the day of surgery, if you wish to have them scanned into your [facility]  Records- EPIC  Please read over the following fact sheets you were given: IF YOU HAVE QUESTIONS ABOUT YOUR PRE-OP INSTRUCTIONS, PLEASE CALL [phone]   [facility] - Preparing for Surgery      Before surgery, you can play an important role.  Because skin is not sterile, your skin needs to be as free of germs as possible.  You can reduce the number of germs on your skin by washing with CHG (chlorahexidine gluconate) soap before surgery.  CHG is an antiseptic cleaner which kills germs and bonds with the skin to  continue killing germs even after washing. Please DO NOT use if you have an allergy to CHG or antibacterial soaps.  If your skin becomes reddened/irritated stop using the CHG and inform your nurse when you arrive at Short Stay. Do not shave (including legs and underarms) for at least 48 hours prior to the first CHG shower.  You may shave your face/neck.  Please follow these instructions carefully:  1.  Shower with CHG Soap the night before surgery ONLY (DO NOT USE THE SOAP THE MORNING OF SURGERY).  2.  If you choose to wash your hair, wash your hair first as usual with your normal  shampoo.  3.  After you shampoo, rinse your hair and body thoroughly to remove the shampoo.                             4.  Use CHG as you would any other liquid soap.  You can apply chg directly to the skin and wash.  Gently with a scrungie or clean washcloth.  5.  Apply the CHG Soap to your body ONLY FROM THE NECK DOWN.   Do not use on face/ open                           Wound or open sores. Avoid contact with eyes, ears mouth and genitals (private parts).                       Wash face,  Genitals (private parts) with your normal soap.             6.  Wash thoroughly, paying special attention to the area where your  surgery  will be performed.  7.  Thoroughly rinse your body with warm water  from the neck down.  8.  DO NOT shower/wash with your normal soap after using and rinsing off the CHG Soap.                9.  Pat yourself dry with a clean towel.            10.  Wear clean pajamas.            11.  Place clean sheets on your bed the night of your first shower and do not  sleep with pets.  Day of Surgery : Do not apply any CHG, lotions/deodorants the morning of surgery.  Please wear clean clothes to the hospital/surgery center.   FAILURE TO FOLLOW THESE INSTRUCTIONS MAY RESULT IN THE CANCELLATION OF YOUR SURGERY  PATIENT SIGNATURE_________________________________  NURSE  SIGNATURE__________________________________  ________________________________________________________________________

## 2024-11-27 ENCOUNTER — Ambulatory Visit: Payer: Medicare (Managed Care) | Attending: Nurse Practitioner | Admitting: Nurse Practitioner

## 2024-11-27 VITALS — BP 128/88 | HR 84 | Resp 16 | Ht 72.0 in | Wt 219.6 lb

## 2024-11-27 DIAGNOSIS — I2510 Atherosclerotic heart disease of native coronary artery without angina pectoris: Secondary | ICD-10-CM

## 2024-11-27 DIAGNOSIS — I69359 Hemiplegia and hemiparesis following cerebral infarction affecting unspecified side: Secondary | ICD-10-CM | POA: Diagnosis not present

## 2024-11-27 DIAGNOSIS — Z86718 Personal history of other venous thrombosis and embolism: Secondary | ICD-10-CM | POA: Diagnosis not present

## 2024-11-27 DIAGNOSIS — E785 Hyperlipidemia, unspecified: Secondary | ICD-10-CM | POA: Diagnosis not present

## 2024-11-27 DIAGNOSIS — Z87898 Personal history of other specified conditions: Secondary | ICD-10-CM

## 2024-11-27 DIAGNOSIS — R002 Palpitations: Secondary | ICD-10-CM | POA: Diagnosis not present

## 2024-11-27 DIAGNOSIS — N183 Chronic kidney disease, stage 3 unspecified: Secondary | ICD-10-CM | POA: Diagnosis not present

## 2024-11-27 DIAGNOSIS — C642 Malignant neoplasm of left kidney, except renal pelvis: Secondary | ICD-10-CM | POA: Diagnosis not present

## 2024-11-27 DIAGNOSIS — I10 Essential (primary) hypertension: Secondary | ICD-10-CM

## 2024-11-27 NOTE — Patient Instructions (Signed)
 Medication Instructions:  Your physician recommends that you continue on your current medications as directed. Please refer to the Current Medication list given to you today.  *If you need a refill on your cardiac medications before your next appointment, please call your pharmacy*  Lab Work: LIPIDS, CMET, CBC (at your convenience  If you have labs (blood work) drawn today and your tests are completely normal, you will receive your results only by: MyChart Message (if you have MyChart) OR A paper copy in the mail If you have any lab test that is abnormal or we need to change your treatment, we will call you to review the results.    Follow-Up: At [facility], you and your health needs are our priority.  As part of our continuing mission to provide you with exceptional heart care, our providers are all part of one team.  This team includes your primary Cardiologist (physician) and Advanced Practice Providers or APPs (Physician Assistants and Nurse Practitioners) who all work together to provide you with the care you need, when you need it.  Your next appointment:   6 month(s)  Provider:   Lonni Cash, MD    We recommend signing up for the patient portal called MyChart.  Sign up information is provided on this After Visit Summary.  MyChart is used to connect with patients for Virtual Visits (Telemedicine).  Patients are able to view lab/test results, encounter notes, upcoming appointments, etc.  Non-urgent messages can be sent to your provider as well.   To learn more about what you can do with MyChart, go to [email or web address].

## 2024-11-27 NOTE — Progress Notes (Signed)
 "  Office Visit    Patient Name: Tony Davis. Date of Encounter: 11/27/2024  Primary Care Provider:  [name], DO Primary Cardiologist:  Lonni Cash, MD  Chief Complaint    63 year old male with a history of CAD s/p DES in LCx in 2011, recurrent DVT s/p IVC filter, left basal ganglia (2015) and right cerebellar hemorrhagic stroke (2024), hydrocephalus s/p VP shunt (2015), hypertension, hyperlipidemia, seizures, CKD stage IIIb, renal cancer s/p left radical nephrectomy in 2024, vascular dementia, and OSA who presents for follow-up related to CAD.  Past Medical History    Past Medical History:  Diagnosis Date   Acute respiratory failure (HCC) 2015   a. Following BG hemorrhage - s/p trache with decannulation.   Basal ganglia hemorrhage (HCC) 2015   a. 2015 - with intraventricular extension and hydrocephalus requiring right crani and VP shunt, complicated by encephalopathy, protein calorie malnutrition, acute resp failure req trache and PEG, BLE DVT s/p IVC, enterobacter PNA, subsequent right hemiparesis and difficulty communicating.   BPH (benign prostatic hyperplasia)    CAD (coronary artery disease)    a. s/p promus DES circumflex artery 10/13/10. b. Cath 04/2013: stable disease but possible small vessel disease -Imdur  added.   Carotid artery disease    a. 1-29% BICA by study 04/2014.   CVA (cerebral vascular accident) (HCC) 02/2010   a. CVA 2011, prior reported TIAs. b. CNS hemorrhage 2015.   CVA (cerebral vascular accident) (HCC) 06/2014   hemorrhagic; in [place] ; brain activity diminshed; ability to do anything; can't do ADLs on own   DVT (deep venous thrombosis) (HCC) 2013   a. 2013. b. 2015 - reported BLE DVT s/p IVC filter.    Enterobacter cloacae pneumonia (HCC) 2015   Falls    Folate deficiency anemia    GERD (gastroesophageal reflux disease)    Hematuria 07/2011   History of kidney stones    HLD (hyperlipidemia)    HTN (hypertension)     Hydrocephalus (HCC) 2015   a. 2/2 BG hemorrhage.   Incontinence of feces    Lower extremity edema    LV dysfunction    a. 2011 - EF 45-50%, subsequent 55-60% in 04/2014.   Noncompliance    Orthostatic hypotension    OSA on CPAP    not using CPAP right now (11/30/2014)   PFO (patent foramen ovale)    a. echo 8/12: EF 55-65%, grade 2 diast dysfnx; +PFO on bubble study. b. TEE 4/13: EF normal, atrial septum with suspicion for interatrial septum fenestrations without flow across and few large bubbles noted in LA.  This was not felt to require closure    Protein calorie malnutrition    Rheumatoid arthritis(714.0)    S/P percutaneous endoscopic gastrostomy (PEG) tube placement (HCC) 2015   TIA (transient ischemic attack)    1-2; both after the one in 02/2010 (09/25/2013)   Urinary incontinence    Past Surgical History:  Procedure Laterality Date   CORONARY ANGIOPLASTY WITH STENT PLACEMENT  12/10/2010   1 (09/24/2013)   CYSTOSCOPY/URETEROSCOPY/HOLMIUM LASER/STENT PLACEMENT Right 07/18/2023   Procedure: CYSTOSCOPY RIGHT URETEROSCOPY, HOLMIUM LASER LITHOTRIPSY, AND RIGHT URETERAL STENT PLACEMENT;  Surgeon: Cam Morene ORN, MD;  Location: [facility];  Service: Urology;  Laterality: Right;  90 MINUTES   filter in place for clots      per wife at preop on 01/11/23   KNEE ARTHROSCOPY Bilateral 08/11/1999   2 on left and 3 on rt   LAPAROSCOPIC NEPHRECTOMY Left 01/18/2023  Procedure: LAPAROSCOPIC RADICAL LEFT NEPHRECTOMY;  Surgeon: Cam Morene ORN, MD;  Location: [facility];  Service: Urology;  Laterality: Left;  210 MINUTES NEDDED FOR CASE   LEFT HEART CATHETERIZATION WITH CORONARY ANGIOGRAM N/A 04/24/2013   Procedure: LEFT HEART CATHETERIZATION WITH CORONARY ANGIOGRAM;  Surgeon: Lonni JONETTA Cash, MD;  Location: [facility] CATH LAB;  Service: Cardiovascular;  Laterality: N/A;   shunt in head      TEE WITHOUT CARDIOVERSION  03/21/2012   Procedure: TRANSESOPHAGEAL ECHOCARDIOGRAM (TEE);  Surgeon:  Vina LULLA Gull, MD;  Location: [facility] ENDOSCOPY;  Service: Cardiovascular;  Laterality: N/A;    Allergies  Allergies[1]   Labs/Other Studies Reviewed    The following studies were reviewed today:  Cardiac Studies & Procedures   ______________________________________________________________________________________________     ECHOCARDIOGRAM  ECHOCARDIOGRAM COMPLETE 06/06/2023  Narrative ECHOCARDIOGRAM REPORT    Patient Name:   Tony Davis. Date of Exam: 06/06/2023 Medical Rec #:  [id number]           Height:       71.0 in Accession #:    [id number]          Weight:       245.8 lb Date of Birth:  1961-10-01          BSA:          2.302 m Patient Age:    63 years            BP:           150/85 mmHg Patient Gender: M                   HR:           100 bpm. Exam Location:  [address]  Procedure: 2D Echo, 3D Echo, Cardiac Doppler, Color Doppler and Strain Analysis  Indications:    R55 Syncope I25.10 CAD  History:        Patient has prior history of Echocardiogram examinations, most recent 11/10/2014. CAD and s/p DES LCX, Stroke and History of DVT, s/p IVC filter, right hemiparesis, History PFO, Signs/Symptoms:near syncope; Risk Factors:Sleep Apnea, Hypertension, Dyslipidemia and Former Smoker. Previous echo showed LVEF 70%.  Sonographer:    Nolon Berg BA, RDCS Referring Phys: CASH LONNI, D   Sonographer Comments: Global longitudinal strain was attempted. IMPRESSIONS   1. No LVOT obstruction. Left ventricular ejection fraction, by estimation, is 60 to 65%. The left ventricle has normal function. The left ventricle has no regional wall motion abnormalities. There is moderate concentric left ventricular hypertrophy. Left ventricular diastolic parameters are consistent with Grade I diastolic dysfunction (impaired relaxation). 2. Right ventricular systolic function is normal. The right ventricular size is normal. 3. The mitral valve is normal in  structure. No evidence of mitral valve regurgitation. 4. The aortic valve is grossly normal. Aortic valve regurgitation is not visualized. 5. The inferior vena cava is normal in size with greater than 50% respiratory variability, suggesting right atrial pressure of 3 mmHg.  FINDINGS Left Ventricle: No LVOT obstruction. Left ventricular ejection fraction, by estimation, is 60 to 65%. The left ventricle has normal function. The left ventricle has no regional wall motion abnormalities. The left ventricular internal cavity size was normal in size. There is moderate concentric left ventricular hypertrophy. Left ventricular diastolic parameters are consistent with Grade I diastolic dysfunction (impaired relaxation).  Right Ventricle: The right ventricular size is normal. Right ventricular systolic function is normal.  Left Atrium: Left atrial size was normal in size.  Right Atrium: Right atrial size was normal in size.  Pericardium: There is no evidence of pericardial effusion.  Mitral Valve: The mitral valve is normal in structure. No evidence of mitral valve regurgitation.  Tricuspid Valve: The tricuspid valve is normal in structure. Tricuspid valve regurgitation is not demonstrated.  Aortic Valve: The aortic valve is grossly normal. Aortic valve regurgitation is not visualized.  Pulmonic Valve: Pulmonic valve regurgitation is trivial.  Aorta: The aortic root and ascending aorta are structurally normal, with no evidence of dilitation.  Venous: The inferior vena cava is normal in size with greater than 50% respiratory variability, suggesting right atrial pressure of 3 mmHg.  IAS/Shunts: No atrial level shunt detected by color flow Doppler.   LEFT VENTRICLE PLAX 2D LVIDd:         4.60 cm   Diastology LVIDs:         2.10 cm   LV e' medial:    5.77 cm/s LV PW:         1.60 cm   LV E/e' medial:  10.9 LV IVS:        1.50 cm   LV e' lateral:   4.79 cm/s LVOT diam:     2.30 cm   LV E/e'  lateral: 13.1 LV SV:         106 LV SV Index:   46 LVOT Area:     4.15 cm  3D Volume EF: 3D EF:        69 % LV EDV:       95 ml LV ESV:       30 ml LV SV:        65 ml  RIGHT VENTRICLE RV Basal diam:  3.30 cm RV Mid diam:    3.00 cm RV S prime:     18.20 cm/s TAPSE (M-mode): 3.0 cm  LEFT ATRIUM             Index        RIGHT ATRIUM           Index LA diam:        4.50 cm 1.96 cm/m   RA Area:     10.40 cm LA Vol (A2C):   23.0 ml 9.99 ml/m   RA Volume:   18.20 ml  7.91 ml/m LA Vol (A4C):   27.5 ml 11.95 ml/m LA Biplane Vol: 26.2 ml 11.38 ml/m AORTIC VALVE             PULMONIC VALVE LVOT Vmax:   117.00 cm/s PR End Diast Vel: 6.76 msec LVOT Vmean:  70.600 cm/s LVOT VTI:    0.256 m  AORTA Ao Root diam: 3.40 cm Ao Asc diam:  3.60 cm  MITRAL VALVE MV Area (PHT): 2.91 cm    SHUNTS MV Decel Time: 261 msec    Systemic VTI:  0.26 m MV E velocity: 62.80 cm/s  Systemic Diam: 2.30 cm MV A velocity: 89.90 cm/s MV E/A ratio:  0.70  Photographer signed by Ronal Ross Signature Date/Time: 06/06/2023/11:38:34 AM    Final    MONITORS  LONG TERM MONITOR (3-14 DAYS) 05/07/2023  Narrative Patch Wear Time:  13 days and 23 hours (2024-05-03T10:30:46-0400 to 2024-05-17T10:30:42-0400)  Sinus rhythm. (Minimum HR of 48 bpm, max HR of 109 bpm, and avg HR of 68 bpm). Isolated SVEs were rare (<1.0%), SVE Couplets were rare (<1.0%), and SVE Triplets were rare (<1.0%). Isolated VEs were rare (<1.0%), VE Couplets were rare (<1.0%), and no  VE Triplets were present.       ______________________________________________________________________________________________     Recent Labs: 07/12/2024: Magnesium 1.9 07/14/2024: TSH 1.603 09/01/2024: ALT 18; BUN 14; Creatinine, Ser 1.81; Hemoglobin 13.3; Platelets 214; Potassium 3.5; Sodium 140  Recent Lipid Panel    Component Value Date/Time   CHOL 80 09/21/2023 0838   TRIG 83 09/21/2023 0838   HDL 34 (L) 09/21/2023 0838    CHOLHDL 2.4 09/21/2023 0838   VLDL 17 09/21/2023 0838   LDLCALC 29 09/21/2023 0838   LDLDIRECT 145.0 07/09/2016 1654    History of Present Illness    63 year old male with the above past medical history including CAD s/p DES in LCx in 2011, recurrent DVT s/p IVC filter, left basal ganglia (2015) and right cerebellar hemorrhagic stroke (2024), hydrocephalus s/p VP shunt (2015), hypertension, hyperlipidemia, seizures, CKD stage IIIb, renal cancer s/p left radical nephrectomy in 2024, vascular dementia, and OSA.  He established with Dr. Verlin in 2011.  Echocardiogram in 2011 showed septal and inferolateral basal hypokinesis with mild concentric hypertrophy, EF 45 to 50%.  Follow-up cardiac catheterization in 09/2010 showed severe stenosis in the LCx s/p DES.  Repeat echocardiogram in August 2012 showed PFO with positive bubble study.  He was hospitalized in 2013 in [place]  in the setting of acute CVA.  He was noted to have chronic DVT.  Carotid Dopplers showed less than 50% R ICA stenosis, no evidence of LICA stenosis.  Echocardiogram in 04/2012 showed normal LV size and function, PFO with positive bubble study.  He was started on warfarin.  TEE in 03/2012 showed normal LVEF. The atrial septum had interatrial septum fenestrations without flow across and had few large bubbles noted in LAA. This was not felt to require closure.  He was hospitalized in May 2014 in the setting of chest pain.  Cardiac catheterization in 2014 showed stable CAD, patent stent to the LCx, mild disease in the LAD, mild disease in RCA.  He was hospitalized in 2015 in [place]  in the setting of hemorrhagic stroke.  Coumadin  was discontinued.  He had a prolonged hospitalization with several complications including intraventricular extension and hydrocephalus requiring VP shunt, encephalopathy, protein calorie malnutrition, acute respiratory failure requiring tracheostomy and PEG tube, bilateral lower extremity DVT s/p IVC filter,  Enterobacter pneumonia, subsequent right hemiparesis and speech impairment).  Repeat echocardiogram in December 2015 showed normal LV size and function.  He underwent laparoscopic left radical nephrectomy in 01/2023.  Cardiac monitor in 5/25 for showed sinus rhythm, rare PACs and PVCs, no significant arrhythmia.  He was hospitalized in 09/2023 in the setting of acute intraparenchymal hemorrhage to the right cerebellum.  He was last seen in the office on 06/10/2024 and was stable from a cardiac standpoint.  He was hospitalized in 07/2024 in the setting of new seizures.  He was started on Keppra .  He returned to the ED on 09/01/2024 with altered mental status.  CT of the head was negative for acute findings.  MRI showed sequela of prior hemorrhagic stroke, overall reassuring.  He was loaded with Keppra  and discharged home in stable condition.  He presents today for follow-up.  Since his last visit and most recent hospitalization he has been stable from a cardiac standpoint.  He notes occasional palpitations that last for seconds and resolve spontaneously, he denies any symptoms concerning for angina.  Denies any dyspnea, presyncope or syncope.  Overall, he reports feeling well.  Home Medications    Current Outpatient Medications  Medication Sig Dispense Refill  acetaminophen  (TYLENOL ) 325 MG tablet Take 650 mg by mouth every 6 (six) hours as needed for moderate pain.     atorvastatin  (LIPITOR ) 80 MG tablet TAKE ONE TABLET BY MOUTH DAILY 90 tablet 0   Cholecalciferol (VITAMIN D3) 25 MCG (1000 UT) CAPS Take 1,000 Units by mouth daily.     escitalopram  (LEXAPRO ) 20 MG tablet Take 20 mg by mouth daily.     folic acid  (FOLVITE ) 1 MG tablet Take 1 tablet (1 mg total) by mouth daily. 90 tablet 0   hydrALAZINE  (APRESOLINE ) 100 MG tablet Take 100 mg by mouth 2 (two) times daily.     isosorbide  mononitrate (IMDUR ) 30 MG 24 hr tablet Take 0.5 tablets (15 mg total) by mouth daily. 45 tablet 0   levETIRAcetam  (KEPPRA )  500 MG tablet Take 1 tablet (500 mg total) by mouth 2 (two) times daily. 180 tablet 0   lisinopril  (ZESTRIL ) 40 MG tablet Take 1 tablet (40 mg total) by mouth daily. 90 tablet 0   Loratadine 10 MG CHEW Chew 10 mg by mouth daily.     melatonin 5 MG TABS Take 5 mg by mouth at bedtime.     memantine  (NAMENDA ) 10 MG tablet Take 10 mg by mouth 2 (two) times daily.     metoprolol  succinate (TOPROL -XL) 100 MG 24 hr tablet Take 1 tablet (100 mg total) by mouth 2 (two) times daily. Take with or immediately following a meal. 180 tablet 3   pantoprazole  (PROTONIX ) 40 MG tablet Take 1 tablet (40 mg total) by mouth daily.     rivastigmine  (EXELON ) 6 MG capsule Take 6 mg by mouth 2 (two) times daily.     sodium fluoride (DENTA 5000 PLUS) 1.1 % CREA dental cream Place 1 Application onto teeth every evening.     No current facility-administered medications for this visit.     Review of Systems    He denies chest pain, dyspnea, pnd, orthopnea, n, v, dizziness, syncope, edema, weight gain, or early satiety. All other systems reviewed and are otherwise negative except as noted above.   Physical Exam    VS:  BP 128/88   Pulse 84   Resp 16   Ht 6' (1.829 m)   Wt 219 lb 9.6 oz (99.6 kg)   SpO2 96%   BMI 29.78 kg/m   GEN: Well nourished, well developed, in no acute distress. HEENT: normal. Neck: Supple, no JVD, carotid bruits, or masses. Cardiac: RRR, no murmurs, rubs, or gallops. No clubbing, cyanosis, edema.  Radials/DP/PT 2+ and equal bilaterally.  Respiratory:  Respirations regular and unlabored, clear to auscultation bilaterally. GI: Soft, nontender, nondistended, BS + x 4. MS: no deformity or atrophy. Skin: warm and dry, no rash. Neuro:  Strength and sensation are intact. Psych: Normal affect.  Accessory Clinical Findings    ECG personally reviewed by me today -    - no EKG in office today.    Lab Results  Component Value Date   WBC 5.8 09/01/2024   HGB 13.3 09/01/2024   HCT 41.4  09/01/2024   MCV 94.5 09/01/2024   PLT 214 09/01/2024   Lab Results  Component Value Date   CREATININE 1.81 (H) 09/01/2024   BUN 14 09/01/2024   NA 140 09/01/2024   K 3.5 09/01/2024   CL 102 09/01/2024   CO2 28 09/01/2024   Lab Results  Component Value Date   ALT 18 09/01/2024   AST 25 09/01/2024   ALKPHOS 103 09/01/2024   BILITOT  0.8 09/01/2024   Lab Results  Component Value Date   CHOL 80 09/21/2023   HDL 34 (L) 09/21/2023   LDLCALC 29 09/21/2023   LDLDIRECT 145.0 07/09/2016   TRIG 83 09/21/2023   CHOLHDL 2.4 09/21/2023    Lab Results  Component Value Date   HGBA1C 5.8 (H) 09/21/2023    Assessment & Plan   1. CAD: S/p DES in LCx in 2011. Cardiac catheterization in 2014 showed stable CAD, patent stent to the LCx, mild disease in the LAD, mild disease in RCA.  Stable with no anginal symptoms. No indication for ischemic evaluation. Continue metoprolol , lisinopril , Imdur , hydralazine , Lipitor .  2. Palpitations: Cardiac monitor in 5/25 for showed sinus rhythm, rare PACs and PVCs, no significant arrhythmia. He reports intermittent fleeting palpitations, denies any associated symptoms.  Continue metoprolol .  3. Hypertension: BP elevated in office today, improved with recheck.  Continue current antihypertensive regimen.   4. Hyperlipidemia: LDL was 29 in 09/2023.  Will update fasting lipids, CMET, CBC. Continue atorvastatin .  5. History of CVA: Patient with history of left basal ganglia hemorrhage complicated by hydrocephalus s/p VP shunt placement in 2015. Patient found to have right cell Beller hemorrhage in 09/2023, felt to be related to hypertension. Residual L hemiparesis. Followed by neurology.   6. History of DVT: S/p IVC filter. Not a candidate for anticoagulation given CNS hemorrhage.  7. History of seizures: Following with neurology.   8. Renal cancer/CKD stage IIIb: S/p left radical nephrectomy in 2024. Creatinine was 1.810 in 08/2024. Repeat CMET pending as  above.  9. Disposition: Follow-up in 6 months with Dr. Verlin, sooner if needed.       Damien JAYSON Braver, NP 11/27/2024, 3:13 PM       [1]  Allergies Allergen Reactions   Iodine Anaphylaxis   Norvasc  [Amlodipine ] Swelling and Other (See Comments)    Foot edema   Aricept [Donepezil] Other (See Comments)    Hallucinations   "

## 2024-11-30 ENCOUNTER — Encounter (HOSPITAL_COMMUNITY)
Admission: RE | Admit: 2024-11-30 | Discharge: 2024-11-30 | Disposition: A | Discharge status: Home / Self Care | Payer: Medicare (Managed Care) | Source: Ambulatory Visit | Attending: Anesthesiology | Admitting: Anesthesiology

## 2024-11-30 DIAGNOSIS — I10 Essential (primary) hypertension: Secondary | ICD-10-CM

## 2024-11-30 NOTE — Progress Notes (Signed)
 Pt. Did not show up for PST appointment,when he was reach over the phone his wife said,that they though the appointment was at 8:30 AM and that they were 25 minutes away from the hospital. RN suggested them, to come to the admitting office to be check in, and do the labs. Pt.'s wife said that she prefers to reschedule this appointment.Also his appointment needs to be done by [facility],[place] PST secretary has been notified.

## 2024-12-08 NOTE — Progress Notes (Signed)
 Patient is a poor historian. Has hx of stroke and dementia. He is oriented to self, place, and time. Could not tell me why he is here or what surgery he is having done. Wife states patient is at his baseline. Wife is main caretaker.  Date of COVID positive in last 90 days:  PCP - Abigail Aho, PA Cardiologist - Lonni Cash, MD LOV 11/27/24 Neurologist- Eduard Hanlon, MD  Chest x-ray - 09/01/24 Epic EKG - 08/31/24 Epic Stress Test - been awhile per pt ECHO - 06/06/23 Epic Cardiac Cath - 2014 Pacemaker/ICD device last checked:N/A Spinal Cord Stimulator:N/A  Bowel Prep - N/A  Sleep Study - yes CPAP -  yes every night per pt  Fasting Blood Sugar - N/A Checks Blood Sugar _____ times a day  Last dose of GLP1 agonist-  N/A GLP1 instructions:  Do not take after     Last dose of SGLT-2 inhibitors-  N/A SGLT-2 instructions:  Do not take after     Blood Thinner Instructions: N/A Last dose:   Time: Aspirin  Instructions:N/A Last Dose:  Activity level: Can go up a flight of stairs and perform activities of daily living without stopping and without symptoms of chest pain or SOB  Anesthesia review: CAD, PFO,CHF, HTN, OSA, seizure  Patient denies shortness of breath, fever, cough and chest pain at PAT appointment  Patient verbalized understanding of instructions that were given to them at the PAT appointment. Patient was also instructed that they will need to review over the PAT instructions again at home before surgery.

## 2024-12-09 ENCOUNTER — Other Ambulatory Visit: Payer: Self-pay

## 2024-12-09 ENCOUNTER — Encounter (HOSPITAL_COMMUNITY)
Admission: RE | Admit: 2024-12-09 | Discharge: 2024-12-09 | Disposition: A | Discharge status: Home / Self Care | Payer: Medicare (Managed Care) | Source: Ambulatory Visit | Attending: Urology | Admitting: Urology

## 2024-12-09 ENCOUNTER — Encounter (HOSPITAL_COMMUNITY): Payer: Self-pay

## 2024-12-09 VITALS — BP 142/87 | HR 65 | Temp 97.6°F | Resp 16 | Ht 71.0 in | Wt 219.6 lb

## 2024-12-09 DIAGNOSIS — Z87891 Personal history of nicotine dependence: Secondary | ICD-10-CM | POA: Diagnosis not present

## 2024-12-09 DIAGNOSIS — Z01818 Encounter for other preprocedural examination: Secondary | ICD-10-CM | POA: Diagnosis present

## 2024-12-09 DIAGNOSIS — I119 Hypertensive heart disease without heart failure: Secondary | ICD-10-CM | POA: Diagnosis not present

## 2024-12-09 DIAGNOSIS — N200 Calculus of kidney: Secondary | ICD-10-CM | POA: Diagnosis not present

## 2024-12-09 DIAGNOSIS — I10 Essential (primary) hypertension: Secondary | ICD-10-CM

## 2024-12-09 DIAGNOSIS — Z9861 Coronary angioplasty status: Secondary | ICD-10-CM | POA: Diagnosis not present

## 2024-12-09 DIAGNOSIS — I2510 Atherosclerotic heart disease of native coronary artery without angina pectoris: Secondary | ICD-10-CM | POA: Insufficient documentation

## 2024-12-09 LAB — CBC
HCT: 45.4 % (ref 39.0–52.0)
Hemoglobin: 14.3 g/dL (ref 13.0–17.0)
MCH: 30 pg (ref 26.0–34.0)
MCHC: 31.5 g/dL (ref 30.0–36.0)
MCV: 95.4 fL (ref 80.0–100.0)
Platelets: 223 K/uL (ref 150–400)
RBC: 4.76 MIL/uL (ref 4.22–5.81)
RDW: 12.5 % (ref 11.5–15.5)
WBC: 5.5 K/uL (ref 4.0–10.5)
nRBC: 0 % (ref 0.0–0.2)

## 2024-12-09 LAB — BASIC METABOLIC PANEL WITH GFR
Anion gap: 11 (ref 5–15)
BUN: 16 mg/dL (ref 8–23)
CO2: 27 mmol/L (ref 22–32)
Calcium: 9.7 mg/dL (ref 8.9–10.3)
Chloride: 104 mmol/L (ref 98–111)
Creatinine, Ser: 1.59 mg/dL — ABNORMAL HIGH (ref 0.61–1.24)
GFR, Estimated: 49 mL/min — ABNORMAL LOW
Glucose, Bld: 84 mg/dL (ref 70–99)
Potassium: 3.4 mmol/L — ABNORMAL LOW (ref 3.5–5.1)
Sodium: 142 mmol/L (ref 135–145)

## 2024-12-09 NOTE — Patient Instructions (Signed)
 SURGICAL WAITING ROOM VISITATION  Patients having surgery or a procedure may have no more than 2 support people in the waiting area - these visitors may rotate.    Children ages 1 and under will not be able to visit patients in [facility] under most circumstances.   Visitors with respiratory illnesses are discouraged from visiting and should remain at home.  If the patient needs to stay at the hospital during part of their recovery, the visitor guidelines for inpatient rooms apply. Pre-op nurse will coordinate an appropriate time for 1 support person to accompany patient in pre-op.  This support person may not rotate.    Please refer to the [facility] website for the visitor guidelines for Inpatients (after your surgery is over and you are in a regular room).    Your procedure is scheduled on: 12/18/23   Report to [facility] Main Entrance    Report to admitting at 5:15 AM   Call this number if you have problems the morning of surgery [phone]   Do not eat food or drink liquids :After Midnight.          If you have questions, please contact your surgeons office.   FOLLOW BOWEL PREP AND ANY ADDITIONAL PRE OP INSTRUCTIONS YOU RECEIVED FROM YOUR SURGEON'S OFFICE!!!     Oral Hygiene is also important to reduce your risk of infection.                                    Remember - BRUSH YOUR TEETH THE MORNING OF SURGERY WITH YOUR REGULAR TOOTHPASTE  DENTURES WILL BE REMOVED PRIOR TO SURGERY PLEASE DO NOT APPLY Poly grip OR ADHESIVES!!!   Do NOT smoke after Midnight   Stop all vitamins and herbal supplements 7 days before surgery.   Take these medicines the morning of surgery with A SIP OF WATER : Tylenol , Atorvastatin , Lexapro , Isosorbide , Keppra , Metoprolol , Pantoprazole    DO NOT TAKE ANY ORAL DIABETIC MEDICATIONS DAY OF YOUR SURGERY  Bring CPAP mask and tubing day of surgery.                              You may not have any metal on your body  including jewelry, and body piercing             Do not wear lotions, powders, cologne, or deodorant              Men may shave face and neck.   Do not bring valuables to the hospital. [facility] IS NOT             RESPONSIBLE   FOR VALUABLES.   Contacts, glasses, dentures or bridgework may not be worn into surgery.  DO NOT BRING YOUR HOME MEDICATIONS TO THE HOSPITAL. PHARMACY WILL DISPENSE MEDICATIONS LISTED ON YOUR MEDICATION LIST TO YOU DURING YOUR ADMISSION IN THE HOSPITAL!    Patients discharged on the day of surgery will not be allowed to drive home.  Someone NEEDS to stay with you for the first 24 hours after anesthesia.   Special Instructions: Bring a copy of your healthcare power of attorney and living will documents the day of surgery if you haven't scanned them before.              Please read over the following fact sheets you were given: IF  YOU HAVE QUESTIONS ABOUT YOUR PRE-OP INSTRUCTIONS PLEASE CALL [phone] [place].   If you received a COVID test during your pre-op visit  it is requested that you wear a mask when out in public, stay away from anyone that may not be feeling well and notify your surgeon if you develop symptoms. If you test positive for Covid or have been in contact with anyone that has tested positive in the last 10 days please notify you surgeon.    [facility] - Preparing for Surgery Before surgery, you can play an important role.  Because skin is not sterile, your skin needs to be as free of germs as possible.  You can reduce the number of germs on your skin by washing with CHG (chlorahexidine gluconate) soap before surgery.  CHG is an antiseptic cleaner which kills germs and bonds with the skin to continue killing germs even after washing. Please DO NOT use if you have an allergy to CHG or antibacterial soaps.  If your skin becomes reddened/irritated stop using the CHG and inform your nurse when you arrive at Short Stay. Do not shave (including legs  and underarms) for at least 48 hours prior to the first CHG shower.  You may shave your face/neck.  Please follow these instructions carefully:  1.  Shower with CHG Soap the night before surgery and the morning of surgery.  2.  If you choose to wash your hair, wash your hair first as usual with your normal  shampoo.  3.  After you shampoo, rinse your hair and body thoroughly to remove the shampoo.                             4.  Use CHG as you would any other liquid soap.  You can apply chg directly to the skin and wash.  Gently with a scrungie or clean washcloth.  5.  Apply the CHG Soap to your body ONLY FROM THE NECK DOWN.   Do   not use on face/ open                           Wound or open sores. Avoid contact with eyes, ears mouth and   genitals (private parts).                       Wash face,  Genitals (private parts) with your normal soap.             6.  Wash thoroughly, paying special attention to the area where your    surgery  will be performed.  7.  Thoroughly rinse your body with warm water  from the neck down.  8.  DO NOT shower/wash with your normal soap after using and rinsing off the CHG Soap.                9.  Pat yourself dry with a clean towel.            10.  Wear clean pajamas.            11.  Place clean sheets on your bed the night of your first shower and do not  sleep with pets. Day of Surgery : Do not apply any CHG, lotions/deodorants the morning of surgery.  Please wear clean clothes to the hospital/surgery center.  FAILURE TO FOLLOW THESE INSTRUCTIONS MAY RESULT IN THE CANCELLATION  OF YOUR SURGERY  PATIENT SIGNATURE_________________________________  NURSE SIGNATURE__________________________________  ________________________________________________________________________

## 2024-12-09 NOTE — Progress Notes (Signed)
 Not able to get urine culture as patient is incontinent.

## 2024-12-11 NOTE — Anesthesia Preprocedure Evaluation (Addendum)
 "                                  Anesthesia Evaluation  Patient identified by MRN, date of birth, ID band Patient awake    Reviewed: Allergy & Precautions, H&P , NPO status , Patient's Chart, lab work & pertinent test results  Airway Mallampati: III  TM Distance: >3 FB Neck ROM: Full    Dental no notable dental hx. (+) Teeth Intact, Dental Advisory Given   Pulmonary neg pulmonary ROS, sleep apnea , pneumonia, Current Smoker   Pulmonary exam normal breath sounds clear to auscultation       Cardiovascular Exercise Tolerance: Good hypertension, + CAD  negative cardio ROS Normal cardiovascular exam Rhythm:Regular Rate:Normal  06/06/2023 echo 1. No LVOT obstruction. Left ventricular ejection fraction, by  estimation, is 60 to 65%. The left ventricle has normal function. The left  ventricle has no regional wall motion abnormalities. There is moderate  concentric left ventricular hypertrophy.  Left ventricular diastolic parameters are consistent with Grade I  diastolic dysfunction (impaired relaxation).   2. Right ventricular systolic function is normal. The right ventricular  size is normal.   3. The mitral valve is normal in structure. No evidence of mitral valve  regurgitation.   4. The aortic valve is grossly normal. Aortic valve regurgitation is not  visualized.   5. The inferior vena cava is normal in size with greater than 50%  respiratory variability, suggesting right atrial pressure of 3 mmHg.     Neuro/Psych Seizures -,  PSYCHIATRIC DISORDERS  Depression   Dementia TIACVA, Residual Symptoms negative neurological ROS  negative psych ROS   GI/Hepatic negative GI ROS, Neg liver ROS,GERD  ,,  Endo/Other  negative endocrine ROS    Renal/GU Renal InsufficiencyRenal diseasenegative Renal ROS  negative genitourinary   Musculoskeletal negative musculoskeletal ROS (+)    Abdominal   Peds negative pediatric ROS (+)  Hematology negative hematology  ROS (+) Blood dyscrasia, anemia   Anesthesia Other Findings   Reproductive/Obstetrics negative OB ROS                              Anesthesia Physical Anesthesia Plan  ASA: 3  Anesthesia Plan: General   Post-op Pain Management: Tylenol  PO (pre-op)* and Celebrex  PO (pre-op)*   Induction: Intravenous  PONV Risk Score and Plan: 2 and Treatment may vary due to age or medical condition, Ondansetron , Midazolam  and Dexamethasone   Airway Management Planned: Oral ETT and LMA  Additional Equipment: None  Intra-op Plan:   Post-operative Plan: Extubation in OR  Informed Consent: I have reviewed the patients History and Physical, chart, labs and discussed the procedure including the risks, benefits and alternatives for the proposed anesthesia with the patient or authorized representative who has indicated his/her understanding and acceptance.     Dental advisory given  Plan Discussed with: Anesthesiologist and CRNA  Anesthesia Plan Comments: (See PAT note from 12/31  DISCUSSION: Tony Davis is a 64 yo male with complex PMH including former smoking, HTN, CAD s/p PCI to circumflex in 2011, carotid artery disease, multiple CVAs (last was right cerebellar ICH in 2024), hydrocephalus s/p right VP shunt (in 2015 with multiple complications; required trach/PEG), hx of PFO, GERD, hx of DVT s/p IVC filter, RA, OSA (uses CPAP), left renal mass s/p nephrectomy (2024), and kidney stones.   Per chart patient  is poor historian due to hx of multiple prior CVAs and vascular dementia  Patient follows with Cardiology for CAD and palpitations. Last seen on 11/27/24 and patient without complaints. Advised to continue medical therapy and f/u in 6 months.  Patient admitted from 8/2-07/15/24 for seizures. Neurology was consulted and advised starting Keppra .   Seen in the ED on 09/01/24 for AMS c/f breakthrough seizure. Extensive w/u done without findings.  He was loaded with Keppra   and discharged. Has not followed up with Neurology yet but is taking Keppra     VS: BP (!) 142/87   Pulse 65   Temp 36.4 C (Oral)   Resp 16   Ht 5' 11 (1.803 m)   Wt 99.6 kg   SpO2 98%   BMI 30.63 kg/m    EKG 08/31/24:   SR Short PR interval Borderline LAD Minimal ST depression     Echo 06/06/23:   IMPRESSIONS       1. No LVOT obstruction. Left ventricular ejection fraction, by  estimation, is 60 to 65%. The left ventricle has normal function. The left  ventricle has no regional wall motion abnormalities. There is moderate  concentric left ventricular hypertrophy.  Left ventricular diastolic parameters are consistent with Grade I  diastolic dysfunction (impaired relaxation).   2. Right ventricular systolic function is normal. The right ventricular  size is normal.   3. The mitral valve is normal in structure. No evidence of mitral valve  regurgitation.   4. The aortic valve is grossly normal. Aortic valve regurgitation is not  visualized.   5. The inferior vena cava is normal in size with greater than 50%  respiratory variability, suggesting right atrial pressure of 3 mmHg.   )         Anesthesia Quick Evaluation  "

## 2024-12-11 NOTE — Progress Notes (Signed)
 " Case: [id number] Date/Time: 12/17/24 0715   Procedure: CYSTOSCOPY/URETEROSCOPY/HOLMIUM LASER/STENT PLACEMENT (Right) - CYSTOSCOPY/RIGHT URETEROSCOPY/HOLMIUM LASER/STENT PLACEMENT   Anesthesia type: General   Diagnosis: Kidney stone [N20.0]   Pre-op diagnosis: RIGHT KIDNEY STONE   Location: [facility]   Surgeons: Cam Morene ORN, MD       DISCUSSION: Tony Davis is a 64 yo male with complex PMH including former smoking, HTN, CAD s/p PCI to circumflex in 2011, carotid artery disease, multiple CVAs (last was right cerebellar ICH in 2024), hydrocephalus s/p right VP shunt (in 2015 with multiple complications; required trach/PEG), hx of PFO, GERD, hx of DVT s/p IVC filter, RA, OSA (uses CPAP), left renal mass s/p nephrectomy (2024), and kidney stones.  Per chart patient is poor historian due to hx of multiple prior CVAs and vascular dementia  Patient follows with Cardiology for CAD and palpitations. Last seen on 11/27/24 and patient without complaints. Advised to continue medical therapy and f/u in 6 months.  Patient admitted from 8/2-07/15/24 for seizures. Neurology was consulted and advised starting Keppra .   Seen in the ED on 09/01/24 for AMS c/f breakthrough seizure. Extensive w/u done without findings.  He was loaded with Keppra  and discharged. Has not followed up with Neurology yet but is taking Keppra    VS: BP (!) 142/87   Pulse 65   Temp 36.4 C (Oral)   Resp 16   Ht 5' 11 (1.803 m)   Wt 99.6 kg   SpO2 98%   BMI 30.63 kg/m   PROVIDERS: [name], DO Cardiologist - Lonni Cash, MD LOV 11/27/24 Neurologist- Eduard Hanlon, MD  LABS: Labs reviewed: Acceptable for surgery. (all labs ordered are listed, but only abnormal results are displayed)  Labs Reviewed  BASIC METABOLIC PANEL WITH GFR - Abnormal; Notable for the following components:      Result Value   Potassium 3.4 (*)    Creatinine, Ser 1.59 (*)    GFR, Estimated 49 (*)     All other components within normal limits  URINE CULTURE  CBC     CT Chest 11/25/24:   IMPRESSION: 1. Lung-RADS 2, benign appearance or behavior. Continue annual screening with low-dose chest CT without contrast in 12 months. 2. Dilated 4.1 cm ascending thoracic aorta, which can be reassessed on follow-up screening chest CT in 12 months. 3. Two-vessel coronary atherosclerosis. 4. Small pericardial effusion, slightly increased. 5. Nonobstructing right nephrolithiasis. 6. Aortic Atherosclerosis (ICD10-I70.0) and Emphysema (ICD10-J43.9).  EKG 08/31/24:  SR Short PR interval Borderline LAD Minimal ST depression   Echo 06/06/23:   IMPRESSIONS     1. No LVOT obstruction. Left ventricular ejection fraction, by  estimation, is 60 to 65%. The left ventricle has normal function. The left  ventricle has no regional wall motion abnormalities. There is moderate  concentric left ventricular hypertrophy.  Left ventricular diastolic parameters are consistent with Grade I  diastolic dysfunction (impaired relaxation).   2. Right ventricular systolic function is normal. The right ventricular  size is normal.   3. The mitral valve is normal in structure. No evidence of mitral valve  regurgitation.   4. The aortic valve is grossly normal. Aortic valve regurgitation is not  visualized.   5. The inferior vena cava is normal in size with greater than 50%  respiratory variability, suggesting right atrial pressure of 3 mmHg.   Past Medical History:  Diagnosis Date   Acute respiratory failure (HCC) 2015   a. Following BG hemorrhage -  s/p trache with decannulation.   Basal ganglia hemorrhage (HCC) 2015   a. 2015 - with intraventricular extension and hydrocephalus requiring right crani and VP shunt, complicated by encephalopathy, protein calorie malnutrition, acute resp failure req trache and PEG, BLE DVT s/p IVC, enterobacter PNA, subsequent right hemiparesis and difficulty communicating.    BPH (benign prostatic hyperplasia)    CAD (coronary artery disease)    a. s/p promus DES circumflex artery 10/13/10. b. Cath 04/2013: stable disease but possible small vessel disease -Imdur  added.   Carotid artery disease    a. 1-29% BICA by study 04/2014.   CVA (cerebral vascular accident) (HCC) 02/2010   a. CVA 2011, prior reported TIAs. b. CNS hemorrhage 2015.   CVA (cerebral vascular accident) (HCC) 06/2014   hemorrhagic; in [place] ; brain activity diminshed; ability to do anything; can't do ADLs on own   DVT (deep venous thrombosis) (HCC) 2013   a. 2013. b. 2015 - reported BLE DVT s/p IVC filter.    Enterobacter cloacae pneumonia (HCC) 2015   Falls    Folate deficiency anemia    GERD (gastroesophageal reflux disease)    Hematuria 07/2011   History of kidney stones    HLD (hyperlipidemia)    HTN (hypertension)    Hydrocephalus (HCC) 2015   a. 2/2 BG hemorrhage.   Incontinence of feces    Lower extremity edema    LV dysfunction    a. 2011 - EF 45-50%, subsequent 55-60% in 04/2014.   Noncompliance    Orthostatic hypotension    OSA on CPAP    not using CPAP right now (11/30/2014)   PFO (patent foramen ovale)    a. echo 8/12: EF 55-65%, grade 2 diast dysfnx; +PFO on bubble study. b. TEE 4/13: EF normal, atrial septum with suspicion for interatrial septum fenestrations without flow across and few large bubbles noted in LA.  This was not felt to require closure    Protein calorie malnutrition    Rheumatoid arthritis(714.0)    S/P percutaneous endoscopic gastrostomy (PEG) tube placement (HCC) 2015   TIA (transient ischemic attack)    1-2; both after the one in 02/2010 (09/25/2013)   Urinary incontinence     Past Surgical History:  Procedure Laterality Date   CORONARY ANGIOPLASTY WITH STENT PLACEMENT  12/10/2010   1 (09/24/2013)   CYSTOSCOPY/URETEROSCOPY/HOLMIUM LASER/STENT PLACEMENT Right 07/18/2023   Procedure: CYSTOSCOPY RIGHT URETEROSCOPY, HOLMIUM LASER LITHOTRIPSY,  AND RIGHT URETERAL STENT PLACEMENT;  Surgeon: Cam Morene ORN, MD;  Location: [facility];  Service: Urology;  Laterality: Right;  90 MINUTES   filter in place for clots      per wife at preop on 01/11/23   KNEE ARTHROSCOPY Bilateral 08/11/1999   2 on left and 3 on rt   LAPAROSCOPIC NEPHRECTOMY Left 01/18/2023   Procedure: LAPAROSCOPIC RADICAL LEFT NEPHRECTOMY;  Surgeon: Cam Morene ORN, MD;  Location: [facility];  Service: Urology;  Laterality: Left;  210 MINUTES NEDDED FOR CASE   LEFT HEART CATHETERIZATION WITH CORONARY ANGIOGRAM N/A 04/24/2013   Procedure: LEFT HEART CATHETERIZATION WITH CORONARY ANGIOGRAM;  Surgeon: Lonni JONETTA Cash, MD;  Location: [facility] CATH LAB;  Service: Cardiovascular;  Laterality: N/A;   shunt in head      TEE WITHOUT CARDIOVERSION  03/21/2012   Procedure: TRANSESOPHAGEAL ECHOCARDIOGRAM (TEE);  Surgeon: Vina LULLA Gull, MD;  Location: [facility] ENDOSCOPY;  Service: Cardiovascular;  Laterality: N/A;    MEDICATIONS:  acetaminophen  (TYLENOL ) 325 MG tablet   atorvastatin  (LIPITOR ) 80 MG tablet   Cholecalciferol (VITAMIN  D3) 25 MCG (1000 UT) CAPS   escitalopram  (LEXAPRO ) 20 MG tablet   folic acid  (FOLVITE ) 1 MG tablet   hydrALAZINE  (APRESOLINE ) 100 MG tablet   isosorbide  mononitrate (IMDUR ) 30 MG 24 hr tablet   levETIRAcetam  (KEPPRA ) 500 MG tablet   lisinopril  (ZESTRIL ) 40 MG tablet   Loratadine 10 MG CHEW   melatonin 5 MG TABS   memantine  (NAMENDA ) 10 MG tablet   metoprolol  succinate (TOPROL -XL) 100 MG 24 hr tablet   pantoprazole  (PROTONIX ) 40 MG tablet   rivastigmine  (EXELON ) 6 MG capsule   sodium fluoride (DENTA 5000 PLUS) 1.1 % CREA dental cream   No current facility-administered medications for this encounter.   Burnard CHRISTELLA Odis DEVONNA [facility] Short Stay/Anesthesiology [facility] Phone [phone] 12/11/2024 1:41 PM        "

## 2024-12-17 ENCOUNTER — Ambulatory Visit (HOSPITAL_COMMUNITY): Payer: Medicare (Managed Care)

## 2024-12-17 ENCOUNTER — Encounter (HOSPITAL_COMMUNITY): Payer: Self-pay | Admitting: Urology

## 2024-12-17 ENCOUNTER — Ambulatory Visit (HOSPITAL_COMMUNITY): Payer: Medicare (Managed Care) | Admitting: Medical

## 2024-12-17 ENCOUNTER — Ambulatory Visit (HOSPITAL_COMMUNITY)
Admission: RE | Admit: 2024-12-17 | Discharge: 2024-12-17 | Disposition: A | Discharge status: Home / Self Care | Payer: Medicare (Managed Care) | Source: Ambulatory Visit | Attending: Urology | Admitting: Urology

## 2024-12-17 ENCOUNTER — Encounter (HOSPITAL_COMMUNITY)
Admission: RE | Disposition: A | Discharge status: Home / Self Care | Payer: Self-pay | Source: Ambulatory Visit | Attending: Urology

## 2024-12-17 ENCOUNTER — Other Ambulatory Visit (HOSPITAL_COMMUNITY): Payer: Self-pay

## 2024-12-17 ENCOUNTER — Ambulatory Visit (HOSPITAL_COMMUNITY): Payer: Medicare (Managed Care) | Admitting: Certified Registered"

## 2024-12-17 DIAGNOSIS — Z8673 Personal history of transient ischemic attack (TIA), and cerebral infarction without residual deficits: Secondary | ICD-10-CM | POA: Insufficient documentation

## 2024-12-17 DIAGNOSIS — K219 Gastro-esophageal reflux disease without esophagitis: Secondary | ICD-10-CM | POA: Diagnosis not present

## 2024-12-17 DIAGNOSIS — N200 Calculus of kidney: Secondary | ICD-10-CM | POA: Diagnosis not present

## 2024-12-17 DIAGNOSIS — I10 Essential (primary) hypertension: Secondary | ICD-10-CM | POA: Diagnosis not present

## 2024-12-17 DIAGNOSIS — G4730 Sleep apnea, unspecified: Secondary | ICD-10-CM | POA: Insufficient documentation

## 2024-12-17 DIAGNOSIS — Z85528 Personal history of other malignant neoplasm of kidney: Secondary | ICD-10-CM | POA: Diagnosis not present

## 2024-12-17 DIAGNOSIS — Z01818 Encounter for other preprocedural examination: Secondary | ICD-10-CM

## 2024-12-17 DIAGNOSIS — Z905 Acquired absence of kidney: Secondary | ICD-10-CM | POA: Insufficient documentation

## 2024-12-17 DIAGNOSIS — I2510 Atherosclerotic heart disease of native coronary artery without angina pectoris: Secondary | ICD-10-CM

## 2024-12-17 DIAGNOSIS — N289 Disorder of kidney and ureter, unspecified: Secondary | ICD-10-CM | POA: Insufficient documentation

## 2024-12-17 DIAGNOSIS — F0390 Unspecified dementia without behavioral disturbance: Secondary | ICD-10-CM | POA: Insufficient documentation

## 2024-12-17 DIAGNOSIS — F32A Depression, unspecified: Secondary | ICD-10-CM | POA: Diagnosis not present

## 2024-12-17 DIAGNOSIS — F17210 Nicotine dependence, cigarettes, uncomplicated: Secondary | ICD-10-CM | POA: Diagnosis not present

## 2024-12-17 DIAGNOSIS — F17200 Nicotine dependence, unspecified, uncomplicated: Secondary | ICD-10-CM | POA: Insufficient documentation

## 2024-12-17 HISTORY — PX: CYSTOSCOPY/URETEROSCOPY/HOLMIUM LASER/STENT PLACEMENT: SHX6546

## 2024-12-17 SURGERY — CYSTOSCOPY/URETEROSCOPY/HOLMIUM LASER/STENT PLACEMENT
Anesthesia: General | Site: Ureter | Laterality: Right

## 2024-12-17 MED ORDER — TAMSULOSIN HCL 0.4 MG PO CAPS
0.4000 mg | ORAL_CAPSULE | Freq: Every day | ORAL | 0 refills | Status: AC
  Filled 2024-12-17: qty 7, 7d supply, fill #0

## 2024-12-17 MED ORDER — ROCURONIUM BROMIDE 10 MG/ML (PF) SYRINGE
PREFILLED_SYRINGE | INTRAVENOUS | Status: AC
  Filled 2024-12-17: qty 10

## 2024-12-17 MED ORDER — LIDOCAINE HCL (CARDIAC) PF 100 MG/5ML IV SOSY
PREFILLED_SYRINGE | INTRAVENOUS | Status: DC | PRN
  Administered 2024-12-17: 100 mg via INTRAVENOUS

## 2024-12-17 MED ORDER — ORAL CARE MOUTH RINSE: 15.0000 mL | Freq: Once | OROMUCOSAL | Status: AC

## 2024-12-17 MED ORDER — SODIUM CHLORIDE 0.9 % IR SOLN
Status: DC | PRN
  Administered 2024-12-17: 3000 mL via INTRAVESICAL

## 2024-12-17 MED ORDER — PROPOFOL 10 MG/ML IV BOLUS
INTRAVENOUS | Status: DC | PRN
  Administered 2024-12-17 (×2): 50 mg via INTRAVENOUS
  Administered 2024-12-17: 100 mg via INTRAVENOUS

## 2024-12-17 MED ORDER — FENTANYL CITRATE (PF) 100 MCG/2ML IJ SOLN
INTRAMUSCULAR | Status: AC
  Filled 2024-12-17: qty 2

## 2024-12-17 MED ORDER — EPHEDRINE SULFATE-NACL 50-0.9 MG/10ML-% IV SOSY
PREFILLED_SYRINGE | INTRAVENOUS | Status: DC | PRN
  Administered 2024-12-17 (×4): 5 mg via INTRAVENOUS

## 2024-12-17 MED ORDER — FENTANYL CITRATE (PF) 100 MCG/2ML IJ SOLN
INTRAMUSCULAR | Status: DC | PRN
  Administered 2024-12-17 (×2): 50 ug via INTRAVENOUS

## 2024-12-17 MED ORDER — SUCCINYLCHOLINE CHLORIDE 200 MG/10ML IV SOSY
PREFILLED_SYRINGE | INTRAVENOUS | Status: DC | PRN
  Administered 2024-12-17: 200 mg via INTRAVENOUS

## 2024-12-17 MED ORDER — LACTATED RINGERS IV SOLN: INTRAVENOUS | Status: DC

## 2024-12-17 MED ORDER — SUCCINYLCHOLINE CHLORIDE 200 MG/10ML IV SOSY
PREFILLED_SYRINGE | INTRAVENOUS | Status: AC
  Filled 2024-12-17: qty 10

## 2024-12-17 MED ORDER — ROCURONIUM BROMIDE 10 MG/ML (PF) SYRINGE
PREFILLED_SYRINGE | INTRAVENOUS | Status: DC | PRN
  Administered 2024-12-17: 10 mg via INTRAVENOUS
  Administered 2024-12-17: 40 mg via INTRAVENOUS

## 2024-12-17 MED ORDER — MEPERIDINE HCL 25 MG/ML IJ SOLN: 6.2500 mg | INTRAMUSCULAR | Status: DC | PRN

## 2024-12-17 MED ORDER — PHENYLEPHRINE 80 MCG/ML (10ML) SYRINGE FOR IV PUSH (FOR BLOOD PRESSURE SUPPORT)
PREFILLED_SYRINGE | INTRAVENOUS | Status: DC | PRN
  Administered 2024-12-17 (×4): 80 ug via INTRAVENOUS

## 2024-12-17 MED ORDER — CELECOXIB 200 MG PO CAPS
200.0000 mg | ORAL_CAPSULE | Freq: Once | ORAL | Status: AC
  Administered 2024-12-17: 200 mg via ORAL
  Filled 2024-12-17: qty 1

## 2024-12-17 MED ORDER — ONDANSETRON HCL 4 MG/2ML IJ SOLN
INTRAMUSCULAR | Status: DC | PRN
  Administered 2024-12-17: 4 mg via INTRAVENOUS

## 2024-12-17 MED ORDER — ONDANSETRON HCL 4 MG/2ML IJ SOLN
INTRAMUSCULAR | Status: AC
  Filled 2024-12-17: qty 2

## 2024-12-17 MED ORDER — CHLORHEXIDINE GLUCONATE 0.12 % MT SOLN
15.0000 mL | Freq: Once | OROMUCOSAL | Status: AC
  Administered 2024-12-17: 15 mL via OROMUCOSAL

## 2024-12-17 MED ORDER — PHENYLEPHRINE 80 MCG/ML (10ML) SYRINGE FOR IV PUSH (FOR BLOOD PRESSURE SUPPORT)
PREFILLED_SYRINGE | INTRAVENOUS | Status: AC
  Filled 2024-12-17: qty 10

## 2024-12-17 MED ORDER — PROPOFOL 10 MG/ML IV BOLUS
INTRAVENOUS | Status: AC
  Filled 2024-12-17: qty 20

## 2024-12-17 MED ORDER — OXYCODONE HCL 5 MG PO TABS: 5.0000 mg | ORAL_TABLET | Freq: Once | ORAL | Status: DC | PRN

## 2024-12-17 MED ORDER — DEXAMETHASONE SOD PHOSPHATE PF 10 MG/ML IJ SOLN
INTRAMUSCULAR | Status: AC
  Filled 2024-12-17: qty 1

## 2024-12-17 MED ORDER — CEFAZOLIN SODIUM-DEXTROSE 2-4 GM/100ML-% IV SOLN
2.0000 g | INTRAVENOUS | Status: AC
  Administered 2024-12-17: 2 g via INTRAVENOUS
  Filled 2024-12-17: qty 100

## 2024-12-17 MED ORDER — SUGAMMADEX SODIUM 200 MG/2ML IV SOLN
INTRAVENOUS | Status: DC | PRN
  Administered 2024-12-17: 200 mg via INTRAVENOUS

## 2024-12-17 MED ORDER — TRAMADOL HCL 50 MG PO TABS
50.0000 mg | ORAL_TABLET | Freq: Four times a day (QID) | ORAL | 0 refills | Status: AC | PRN
  Filled 2024-12-17: qty 15, 2d supply, fill #0

## 2024-12-17 MED ORDER — LIDOCAINE HCL (PF) 2 % IJ SOLN
INTRAMUSCULAR | Status: AC
  Filled 2024-12-17: qty 5

## 2024-12-17 MED ORDER — ONDANSETRON HCL 4 MG/2ML IJ SOLN: 4.0000 mg | Freq: Once | INTRAMUSCULAR | Status: DC | PRN

## 2024-12-17 MED ORDER — OXYCODONE HCL 5 MG/5ML PO SOLN: 5.0000 mg | Freq: Once | ORAL | Status: DC | PRN

## 2024-12-17 MED ORDER — IOHEXOL 300 MG/ML  SOLN
INTRAMUSCULAR | Status: DC | PRN
  Administered 2024-12-17: 8 mL via URETHRAL

## 2024-12-17 MED ORDER — SUGAMMADEX SODIUM 200 MG/2ML IV SOLN
INTRAVENOUS | Status: AC
  Filled 2024-12-17: qty 2

## 2024-12-17 MED ORDER — ACETAMINOPHEN 500 MG PO TABS
1000.0000 mg | ORAL_TABLET | Freq: Once | ORAL | Status: AC
  Administered 2024-12-17: 1000 mg via ORAL
  Filled 2024-12-17: qty 2

## 2024-12-17 MED ORDER — FENTANYL CITRATE (PF) 50 MCG/ML IJ SOSY
PREFILLED_SYRINGE | INTRAMUSCULAR | Status: AC
  Filled 2024-12-17: qty 1

## 2024-12-17 MED ORDER — FENTANYL CITRATE (PF) 50 MCG/ML IJ SOSY: 25.0000 ug | PREFILLED_SYRINGE | INTRAMUSCULAR | Status: DC | PRN

## 2024-12-17 MED ORDER — EPHEDRINE 5 MG/ML INJ
INTRAVENOUS | Status: AC
  Filled 2024-12-17: qty 5

## 2024-12-17 SURGICAL SUPPLY — 21 items
BAG URO CATCHER STRL LF (MISCELLANEOUS) ×1 IMPLANT
BASKET STONE NCOMPASS (UROLOGICAL SUPPLIES) IMPLANT
BASKET ZERO TIP NITINOL 2.4FR (BASKET) IMPLANT
CATH URETERAL DUAL LUMEN 10F (MISCELLANEOUS) IMPLANT
CATH URETL OPEN 5X70 (CATHETERS) ×1 IMPLANT
CLOTH BEACON ORANGE TIMEOUT ST (SAFETY) ×1 IMPLANT
EXTRACTOR STONE NITINOL NGAGE (UROLOGICAL SUPPLIES) IMPLANT
GLOVE SURG LX STRL 7.5 STRW (GLOVE) ×1 IMPLANT
GOWN STRL REUS W/ TWL XL LVL3 (GOWN DISPOSABLE) ×1 IMPLANT
GUIDEWIRE ANG ZIPWIRE 038X150 (WIRE) IMPLANT
GUIDEWIRE STR DUAL SENSOR (WIRE) ×1 IMPLANT
GUIDEWIRE STRT TIP .038X150X3 (WIRE) IMPLANT
KIT TURNOVER KIT A (KITS) ×1 IMPLANT
MANIFOLD NEPTUNE II (INSTRUMENTS) ×1 IMPLANT
PACK CYSTO (CUSTOM PROCEDURE TRAY) ×1 IMPLANT
SHEATH NAVIGATOR HD 11/13X28 (SHEATH) IMPLANT
SHEATH NAVIGATOR HD 11/13X36 (SHEATH) IMPLANT
STENT URET 6FRX26 CONTOUR (STENTS) IMPLANT
TRACTIP FLEXIVA PULS ID 200XHI (Laser) IMPLANT
TUBING CONNECTING 10 (TUBING) ×1 IMPLANT
TUBING UROLOGY SET (TUBING) ×1 IMPLANT

## 2024-12-17 NOTE — Op Note (Signed)
 Preoperative diagnosis:  Right nonobstructing stones  Postoperative diagnosis:  same   Procedure: Right retrograde pyelogram with interpretation Right ureteroscopy, laser lithotripsy Right ureteral stent placement  Surgeon: Morene MICAEL Salines, MD  Anesthesia: General  Complications: None  Intraoperative findings:  #1: The patient's right retrograde pyelogram demonstrated normal caliber ureter with no filling defects or other abnormalities.  There is no hydroureteronephrosis. 2.:  There were stones in the upper pole that required laser fragmentation, there were stones in the midpole that were small enough to be removed without laser and stones in the lower pole that were also small enough to be removed without fragmentation. 3.:  And then the case the patient was predominantly stone free, there were very small little stones that I was unable to basket, that left in the renal pelvis. 4.:  26 cm time 6 French double-J stent was placed at the end of the case.  EBL: Minimal  Specimens: None  Indication: Min Collymore. is a 64 y.o. patient with solitary kidney and nonobstructing stones with poor sense of pain given his hemorrhagic stroke.  After reviewing the management options for treatment, he elected to proceed with the above surgical procedure(s). We have discussed the potential benefits and risks of the procedure, side effects of the proposed treatment, the likelihood of the patient achieving the goals of the procedure, and any potential problems that might occur during the procedure or recuperation. Informed consent has been obtained.  Description of procedure: Consent was obtained the preoperative holding area.  He was brought back to the operating room placed on table in supine position.  General esthesia was then induced endotracheal tube was inserted.  He was placed in the dorsolithotomy position and red prepped and draped in the routine sterile fashion.  A timeout was then  held.  21 French 30 degree cystoscope was gently passed through the patient's urethra and into the bladder under physician guidance.  Using a 5 French open-ended ureteral catheter I cannulated the patient's right ureteral orifice and performed retrograde grade pyelogram with 10 cc of Omnipaque  contrast and the above findings.  I then advanced a wire up through the open-ended catheter removed and catheter over the wire.  The bladder was subsequently emptied and the removed over the wire.  I then advanced a dual-lumen catheter over the wire and placed a second wire into the renal pelvis.  I then advanced a medium sized 12/14 French ureteral access sheath over the second wire and into the proximal ureter under fluoroscopic guidance.  I then removed the inner portion and the second wire.  I then advanced the flexible ureteroscope up through the ureteral access sheath and into the renal pelvis.  Pyeloscopy was performed noting the above findings.  I then systematically worked from the upper pole to the lower pole removing all the patient's stones.  I did need to use the 200 m laser fiber to fragment the stones in the upper pole so that they would be small enough to remove.  I used the engage basket as well as the escape basket to remove all the stones.  Once all the stones had been removed I triple checked the calyces using fluoroscopy to ensure that all stones had been removed.  There were some stones that were in the renal pelvis, small fragments, that I was unable to basket.  Subsequently sweep out the ureter using the escape basket and simultaneously removed the ureteral access sheath noting no significant ureteral trauma.  I then  backloaded the cystoscope over the safety wire and repassed it into the patient's bladder under visual guidance.  I subsequently advanced a 26 cm x 6 French double-J ureteral stent over the wire and into the right upper pole under fluoroscopic guidance.  Once the stent was noted to be  well within the upper pole I pulled the scope back to the bladder neck and advanced the stent into the bladder and then remove the wire.  It was noted to be well-positioned fluoroscopically.  I subsequently emptied the patient's bladder and pulled the stent tether through the patient's urethra.  I secured it to the dorsum of his penis using benzoin and then 2 x 2 Tegaderm.  I then reduced his foreskin.  The patient was subsequently extubated and returned to PACU in stable condition.  Disposition: The patient is being discharged home, he will follow-up with us  in 1 week for stent removal.

## 2024-12-17 NOTE — Anesthesia Postprocedure Evaluation (Signed)
"   Anesthesia Post Note  Patient: Ibn Stief.  Procedure(s) Performed: CYSTOSCOPY/URETEROSCOPY/HOLMIUM LASER/STENT PLACEMENT (Right: Ureter)     Patient location during evaluation: PACU Anesthesia Type: General Level of consciousness: awake and alert Pain management: pain level controlled Vital Signs Assessment: post-procedure vital signs reviewed and stable Respiratory status: spontaneous breathing, nonlabored ventilation, respiratory function stable and patient connected to nasal cannula oxygen Cardiovascular status: blood pressure returned to baseline and stable Postop Assessment: no apparent nausea or vomiting Anesthetic complications: no   No notable events documented.  Last Vitals:  Vitals:   12/17/24 1000 12/17/24 1015  BP: (!) 147/84 (!) 142/90  Pulse: 71 66  Resp: 14 14  Temp:    SpO2: 100% 98%    Last Pain:  Vitals:   12/17/24 1000  TempSrc:   PainSc: Asleep                 Westlyn Glaza      "

## 2024-12-17 NOTE — Transfer of Care (Signed)
 Immediate Anesthesia Transfer of Care Note  Patient: Tony Davis.  Procedure(s) Performed: CYSTOSCOPY/URETEROSCOPY/HOLMIUM LASER/STENT PLACEMENT (Right: Ureter)  Patient Location: PACU  Anesthesia Type:General  Level of Consciousness: drowsy and responds to stimulation  Airway & Oxygen Therapy: Patient Spontanous Breathing and Patient connected to face mask oxygen  Post-op Assessment: Report given to RN and Post -op Vital signs reviewed and stable  Post vital signs: Reviewed and stable  Last Vitals:  Vitals Value Taken Time  BP 166/108 12/17/24 09:49  Temp 36.4 C 12/17/24 09:49  Pulse 76 12/17/24 09:51  Resp 14 12/17/24 09:51  SpO2 100 % 12/17/24 09:51  Vitals shown include unfiled device data.  Last Pain:  Vitals:   12/17/24 0553  TempSrc:   PainSc: 0-No pain         Complications: No notable events documented.

## 2024-12-17 NOTE — Interval H&P Note (Signed)
 History and Physical Interval Note:  12/17/2024 7:21 AM  Tony Davis.  has presented today for surgery, with the diagnosis of RIGHT KIDNEY STONE.  The various methods of treatment have been discussed with the patient and family. After consideration of risks, benefits and other options for treatment, the patient has consented to  Procedures with comments: CYSTOSCOPY/URETEROSCOPY/HOLMIUM LASER/STENT PLACEMENT (Right) - CYSTOSCOPY/RIGHT URETEROSCOPY/HOLMIUM LASER/STENT PLACEMENT as a surgical intervention.  The patient's history has been reviewed, patient examined, no change in status, stable for surgery.  I have reviewed the patient's chart and labs.  Questions were answered to the patient's satisfaction.     [name]

## 2024-12-17 NOTE — Anesthesia Procedure Notes (Signed)
 Procedure Name: LMA Insertion Date/Time: 12/17/2024 7:36 AM  Performed by: Metta Andrea NOVAK, CRNAPre-anesthesia Checklist: Patient identified, Emergency Drugs available, Suction available, Patient being monitored and Timeout performed Patient Re-evaluated:Patient Re-evaluated prior to induction Oxygen Delivery Method: Circle system utilized Preoxygenation: Pre-oxygenation with 100% oxygen Induction Type: IV induction LMA: LMA inserted LMA Size: 4.0 Number of attempts: 1 Tube secured with: Tape Dental Injury: Teeth and Oropharynx as per pre-operative assessment  Comments: Inadequate volumes, placement adjusted with continued inadequate volumes, LMA removed and will proceed with ett

## 2024-12-17 NOTE — Discharge Instructions (Signed)
 DISCHARGE INSTRUCTIONS FOR KIDNEY STONE/URETERAL STENT   MEDICATIONS:  1.  Resume all your other meds from home - except do not take any extra narcotic pain meds that you may have at home.  2. Tamsulosin  is for stent discomfort 3. Tramadol  is for moderate/severe pain, otherwise taking upto 1000 mg every 6 hours of plainTylenol will help treat your pain.     ACTIVITY:  1. No strenuous activity x 1week  2. No driving while on narcotic pain medications  3. Drink plenty of water   4. Continue to walk at home - you can still get blood clots when you are at home, so keep active, but don't over do it.  5. May return to work/school tomorrow or when you feel ready   BATHING:  1. You can shower and we recommend daily showers  2. You have a string coming from your urethra: The stent string is attached to your ureteral stent. Do not pull on this.   SIGNS/SYMPTOMS TO CALL:  Please call us  if you have a fever greater than 101.5, uncontrolled nausea/vomiting, uncontrolled pain, dizziness, unable to urinate, bloody urine, chest pain, shortness of breath, leg swelling, leg pain, redness around wound, drainage from wound, or any other concerns or questions.   You can reach us  at [phone].   FOLLOW-UP:  1. You have an appointment for stent removal in 1 week.

## 2024-12-17 NOTE — Anesthesia Procedure Notes (Signed)
 Procedure Name: Intubation Date/Time: 12/17/2024 7:51 AM  Performed by: Metta Andrea NOVAK, CRNAPre-anesthesia Checklist: Patient identified, Emergency Drugs available, Suction available, Patient being monitored and Timeout performed Patient Re-evaluated:Patient Re-evaluated prior to induction Oxygen Delivery Method: Circle system utilized Preoxygenation: Pre-oxygenation with 100% oxygen Induction Type: IV induction Ventilation: Oral airway inserted - appropriate to patient size and Two handed mask ventilation required Laryngoscope Size: Glidescope and 4 Grade View: Grade I Tube type: Oral Tube size: 7.5 mm Number of attempts: 1 Airway Equipment and Method: Stylet Placement Confirmation: ETT inserted through vocal cords under direct vision, positive ETCO2 and breath sounds checked- equal and bilateral Secured at: 23 cm Tube secured with: Tape Dental Injury: Teeth and Oropharynx as per pre-operative assessment  Comments: LMA removed, 2 hand mask with oral airway required, anticipated difficulty, glidescope 4 x 1 attempt by Dr Mallory, +/= BBS, +EtCO2

## 2024-12-18 ENCOUNTER — Encounter (HOSPITAL_COMMUNITY): Payer: Self-pay | Admitting: Urology

## 2024-12-18 LAB — URINE CULTURE: Culture: <10000 — AB

## 2024-12-23 ENCOUNTER — Emergency Department (HOSPITAL_COMMUNITY): Payer: Medicare (Managed Care)

## 2024-12-23 ENCOUNTER — Inpatient Hospital Stay (HOSPITAL_COMMUNITY)
Admission: EM | Admit: 2024-12-23 | Discharge: 2024-12-26 | DRG: 871 | Disposition: A | Discharge status: Home Health | Payer: Medicare (Managed Care) | Attending: Internal Medicine | Admitting: Internal Medicine

## 2024-12-23 DIAGNOSIS — I5032 Chronic diastolic (congestive) heart failure: Secondary | ICD-10-CM | POA: Diagnosis present

## 2024-12-23 DIAGNOSIS — B999 Unspecified infectious disease: Secondary | ICD-10-CM

## 2024-12-23 DIAGNOSIS — Z888 Allergy status to other drugs, medicaments and biological substances status: Secondary | ICD-10-CM

## 2024-12-23 DIAGNOSIS — K219 Gastro-esophageal reflux disease without esophagitis: Secondary | ICD-10-CM | POA: Diagnosis present

## 2024-12-23 DIAGNOSIS — F17210 Nicotine dependence, cigarettes, uncomplicated: Secondary | ICD-10-CM | POA: Diagnosis present

## 2024-12-23 DIAGNOSIS — G919 Hydrocephalus, unspecified: Secondary | ICD-10-CM | POA: Diagnosis present

## 2024-12-23 DIAGNOSIS — Z931 Gastrostomy status: Secondary | ICD-10-CM

## 2024-12-23 DIAGNOSIS — Z955 Presence of coronary angioplasty implant and graft: Secondary | ICD-10-CM

## 2024-12-23 DIAGNOSIS — M069 Rheumatoid arthritis, unspecified: Secondary | ICD-10-CM | POA: Diagnosis present

## 2024-12-23 DIAGNOSIS — E785 Hyperlipidemia, unspecified: Secondary | ICD-10-CM | POA: Diagnosis present

## 2024-12-23 DIAGNOSIS — D529 Folate deficiency anemia, unspecified: Secondary | ICD-10-CM | POA: Diagnosis present

## 2024-12-23 DIAGNOSIS — N3090 Cystitis, unspecified without hematuria: Secondary | ICD-10-CM | POA: Diagnosis present

## 2024-12-23 DIAGNOSIS — E860 Dehydration: Secondary | ICD-10-CM | POA: Diagnosis present

## 2024-12-23 DIAGNOSIS — I130 Hypertensive heart and chronic kidney disease with heart failure and stage 1 through stage 4 chronic kidney disease, or unspecified chronic kidney disease: Secondary | ICD-10-CM | POA: Diagnosis present

## 2024-12-23 DIAGNOSIS — G9349 Other encephalopathy: Secondary | ICD-10-CM | POA: Diagnosis not present

## 2024-12-23 DIAGNOSIS — I10 Essential (primary) hypertension: Secondary | ICD-10-CM | POA: Diagnosis not present

## 2024-12-23 DIAGNOSIS — Z95828 Presence of other vascular implants and grafts: Secondary | ICD-10-CM

## 2024-12-23 DIAGNOSIS — R6520 Severe sepsis without septic shock: Secondary | ICD-10-CM | POA: Diagnosis not present

## 2024-12-23 DIAGNOSIS — R4182 Altered mental status, unspecified: Secondary | ICD-10-CM | POA: Diagnosis present

## 2024-12-23 DIAGNOSIS — R569 Unspecified convulsions: Secondary | ICD-10-CM | POA: Diagnosis not present

## 2024-12-23 DIAGNOSIS — Z8249 Family history of ischemic heart disease and other diseases of the circulatory system: Secondary | ICD-10-CM

## 2024-12-23 DIAGNOSIS — E876 Hypokalemia: Secondary | ICD-10-CM | POA: Diagnosis present

## 2024-12-23 DIAGNOSIS — Z91199 Patient's noncompliance with other medical treatment and regimen due to unspecified reason: Secondary | ICD-10-CM

## 2024-12-23 DIAGNOSIS — Z8603 Personal history of neoplasm of uncertain behavior: Secondary | ICD-10-CM

## 2024-12-23 DIAGNOSIS — R9431 Abnormal electrocardiogram [ECG] [EKG]: Secondary | ICD-10-CM | POA: Diagnosis present

## 2024-12-23 DIAGNOSIS — E872 Acidosis, unspecified: Secondary | ICD-10-CM | POA: Diagnosis present

## 2024-12-23 DIAGNOSIS — Q2112 Patent foramen ovale: Secondary | ICD-10-CM

## 2024-12-23 DIAGNOSIS — Z86718 Personal history of other venous thrombosis and embolism: Secondary | ICD-10-CM

## 2024-12-23 DIAGNOSIS — I2510 Atherosclerotic heart disease of native coronary artery without angina pectoris: Secondary | ICD-10-CM | POA: Diagnosis not present

## 2024-12-23 DIAGNOSIS — N12 Tubulo-interstitial nephritis, not specified as acute or chronic: Secondary | ICD-10-CM | POA: Diagnosis present

## 2024-12-23 DIAGNOSIS — G9341 Metabolic encephalopathy: Secondary | ICD-10-CM | POA: Diagnosis present

## 2024-12-23 DIAGNOSIS — A419 Sepsis, unspecified organism: Secondary | ICD-10-CM | POA: Diagnosis present

## 2024-12-23 DIAGNOSIS — I69151 Hemiplegia and hemiparesis following nontraumatic intracerebral hemorrhage affecting right dominant side: Secondary | ICD-10-CM | POA: Diagnosis not present

## 2024-12-23 DIAGNOSIS — G40909 Epilepsy, unspecified, not intractable, without status epilepticus: Secondary | ICD-10-CM | POA: Diagnosis present

## 2024-12-23 DIAGNOSIS — Z823 Family history of stroke: Secondary | ICD-10-CM

## 2024-12-23 DIAGNOSIS — G4733 Obstructive sleep apnea (adult) (pediatric): Secondary | ICD-10-CM | POA: Diagnosis present

## 2024-12-23 DIAGNOSIS — Z79899 Other long term (current) drug therapy: Secondary | ICD-10-CM

## 2024-12-23 DIAGNOSIS — N1831 Chronic kidney disease, stage 3a: Secondary | ICD-10-CM | POA: Diagnosis present

## 2024-12-23 DIAGNOSIS — F0150 Vascular dementia without behavioral disturbance: Secondary | ICD-10-CM | POA: Diagnosis present

## 2024-12-23 DIAGNOSIS — Z982 Presence of cerebrospinal fluid drainage device: Secondary | ICD-10-CM

## 2024-12-23 DIAGNOSIS — N3001 Acute cystitis with hematuria: Secondary | ICD-10-CM | POA: Diagnosis not present

## 2024-12-23 DIAGNOSIS — N179 Acute kidney failure, unspecified: Secondary | ICD-10-CM | POA: Diagnosis present

## 2024-12-23 DIAGNOSIS — N400 Enlarged prostate without lower urinary tract symptoms: Secondary | ICD-10-CM | POA: Diagnosis present

## 2024-12-23 DIAGNOSIS — Z905 Acquired absence of kidney: Secondary | ICD-10-CM

## 2024-12-23 DIAGNOSIS — N390 Urinary tract infection, site not specified: Secondary | ICD-10-CM | POA: Diagnosis present

## 2024-12-23 DIAGNOSIS — Z8701 Personal history of pneumonia (recurrent): Secondary | ICD-10-CM

## 2024-12-23 DIAGNOSIS — Z87442 Personal history of urinary calculi: Secondary | ICD-10-CM

## 2024-12-23 DIAGNOSIS — N1832 Chronic kidney disease, stage 3b: Secondary | ICD-10-CM | POA: Diagnosis present

## 2024-12-23 DIAGNOSIS — R410 Disorientation, unspecified: Secondary | ICD-10-CM | POA: Diagnosis present

## 2024-12-23 DIAGNOSIS — Z91041 Radiographic dye allergy status: Secondary | ICD-10-CM

## 2024-12-23 DIAGNOSIS — N200 Calculus of kidney: Secondary | ICD-10-CM | POA: Diagnosis present

## 2024-12-23 LAB — COMPREHENSIVE METABOLIC PANEL WITH GFR
ALT: 19 U/L (ref 0–44)
AST: 35 U/L (ref 15–41)
Albumin: 4 g/dL (ref 3.5–5.0)
Alkaline Phosphatase: 121 U/L (ref 38–126)
Anion gap: 15 (ref 5–15)
BUN: 32 mg/dL — ABNORMAL HIGH (ref 8–23)
CO2: 25 mmol/L (ref 22–32)
Calcium: 9.6 mg/dL (ref 8.9–10.3)
Chloride: 99 mmol/L (ref 98–111)
Creatinine, Ser: 3.46 mg/dL — ABNORMAL HIGH (ref 0.61–1.24)
GFR, Estimated: 19 mL/min — ABNORMAL LOW
Glucose, Bld: 140 mg/dL — ABNORMAL HIGH (ref 70–99)
Potassium: 3.1 mmol/L — ABNORMAL LOW (ref 3.5–5.1)
Sodium: 138 mmol/L (ref 135–145)
Total Bilirubin: 1.3 mg/dL — ABNORMAL HIGH (ref 0.0–1.2)
Total Protein: 7.7 g/dL (ref 6.5–8.1)

## 2024-12-23 LAB — RESP PANEL BY RT-PCR (RSV, FLU A&B, COVID)  RVPGX2
Influenza A by PCR: NEGATIVE
Influenza B by PCR: NEGATIVE
Resp Syncytial Virus by PCR: NEGATIVE
SARS Coronavirus 2 by RT PCR: NEGATIVE

## 2024-12-23 LAB — CBC WITH DIFFERENTIAL/PLATELET
Abs Immature Granulocytes: 0.08 K/uL — ABNORMAL HIGH (ref 0.00–0.07)
Basophils Absolute: 0.1 K/uL (ref 0.0–0.1)
Basophils Relative: 1 %
Eosinophils Absolute: 0.1 K/uL (ref 0.0–0.5)
Eosinophils Relative: 1 %
HCT: 43.9 % (ref 39.0–52.0)
Hemoglobin: 14.2 g/dL (ref 13.0–17.0)
Immature Granulocytes: 1 %
Lymphocytes Relative: 6 %
Lymphs Abs: 1 K/uL (ref 0.7–4.0)
MCH: 30.3 pg (ref 26.0–34.0)
MCHC: 32.3 g/dL (ref 30.0–36.0)
MCV: 93.8 fL (ref 80.0–100.0)
Monocytes Absolute: 1.4 K/uL — ABNORMAL HIGH (ref 0.1–1.0)
Monocytes Relative: 9 %
Neutro Abs: 13.2 K/uL — ABNORMAL HIGH (ref 1.7–7.7)
Neutrophils Relative %: 82 %
Platelets: 197 K/uL (ref 150–400)
RBC: 4.68 MIL/uL (ref 4.22–5.81)
RDW: 13.2 % (ref 11.5–15.5)
WBC: 15.8 K/uL — ABNORMAL HIGH (ref 4.0–10.5)
nRBC: 0 % (ref 0.0–0.2)

## 2024-12-23 LAB — URINALYSIS, W/ REFLEX TO CULTURE (INFECTION SUSPECTED)
Bilirubin Urine: NEGATIVE
Glucose, UA: NEGATIVE mg/dL
Ketones, ur: NEGATIVE mg/dL
Nitrite: NEGATIVE
Protein, ur: >=300 mg/dL — AB
RBC / HPF: >50 RBC/hpf (ref 0–5)
Specific Gravity, Urine: 1.014 (ref 1.005–1.030)
WBC, UA: >50 WBC/hpf (ref 0–5)
pH: 8 (ref 5.0–8.0)

## 2024-12-23 LAB — I-STAT CG4 LACTIC ACID, ED: Lactic Acid, Venous: 2.6 mmol/L (ref 0.5–1.9)

## 2024-12-23 LAB — PROTIME-INR
INR: 1.2 (ref 0.8–1.2)
Prothrombin Time: 15.5 s — ABNORMAL HIGH (ref 11.4–15.2)

## 2024-12-23 MED ORDER — ACETAMINOPHEN 500 MG PO TABS
1000.0000 mg | ORAL_TABLET | Freq: Once | ORAL | Status: AC
  Administered 2024-12-23: 1000 mg via ORAL
  Filled 2024-12-23: qty 2

## 2024-12-23 MED ORDER — SODIUM CHLORIDE 0.9 % IV SOLN: 1.0000 g | Freq: Once | INTRAVENOUS | Status: DC

## 2024-12-23 MED ORDER — LACTATED RINGERS IV BOLUS
1000.0000 mL | Freq: Once | INTRAVENOUS | Status: AC
  Administered 2024-12-23: 1000 mL via INTRAVENOUS

## 2024-12-23 MED ORDER — SODIUM CHLORIDE 0.9 % IV SOLN: 2.0000 g | INTRAVENOUS | Status: DC

## 2024-12-23 MED ORDER — SODIUM CHLORIDE 0.9 % IV SOLN
2.0000 g | Freq: Once | INTRAVENOUS | Status: AC
  Administered 2024-12-23: 2 g via INTRAVENOUS
  Filled 2024-12-23: qty 12.5

## 2024-12-23 MED ORDER — POTASSIUM CHLORIDE 20 MEQ PO PACK
40.0000 meq | PACK | Freq: Two times a day (BID) | ORAL | Status: DC
  Administered 2024-12-23 – 2024-12-25 (×4): 40 meq via ORAL
  Filled 2024-12-23 (×4): qty 2

## 2024-12-23 NOTE — ED Triage Notes (Signed)
 Patient had surgery last week 01.08.2025 for Kidney Stones.

## 2024-12-23 NOTE — Progress Notes (Signed)
 ED Pharmacy Antibiotic Sign Off An antibiotic consult was received from an ED provider for Cefepime  per pharmacy dosing for UTI. A chart review was completed to assess appropriateness.   The following one time order(s) were placed:  Cefepime  2gm IV   Further antibiotic and/or antibiotic pharmacy consults should be ordered by the admitting provider if indicated.   Thank you for allowing pharmacy to be a part of this patient's care.   Rosaline Millet, [facility]  Clinical Pharmacist 12/23/24 10:22 PM

## 2024-12-23 NOTE — Assessment & Plan Note (Signed)
 Chronic expect some sundowning

## 2024-12-23 NOTE — Assessment & Plan Note (Addendum)
 Family is concerned that patient may be having underlying seizures he has history of seizures that are not tonic-clonic but more absence like seizures per family. Continue Keppra  but changed to IV Order EEG Other explanation for altered mental status could be   - most likely multifactorial secondary to combination of  infection and dehydration  - Will rehydrate   - treat underlining infection   - Hold contributing medications   - if no improvement may need further imaging to evaluate for CNS pathology pathology such as MRI of the brain   - neurological exam appears to be nonfocal but patient unable to cooperate fully   - VBG   Ammonia ordered

## 2024-12-23 NOTE — Assessment & Plan Note (Signed)
" -  SIRS criteria met with  elevated white blood cell count,       Component Value Date/Time   WBC 15.8 (H) 12/23/2024 1950   LYMPHSABS 1.0 12/23/2024 1950     tachycardia   ,   fever   RR >20 Today's Vitals   12/23/24 1921 12/23/24 1928 12/23/24 1955 12/23/24 2221  BP: 110/76  114/78   Pulse: 93     Resp: (!) 22  (!) 25   Temp:  (!) 102.2 F (39 C)  99.7 F (37.6 C)  TempSrc:  Rectal  Rectal  SpO2: 96%        -Most likely source being:  urinary   Acute Kidney Injury with Cr > 2,  Lab Results  Component Value Date   CREATININE 3.46 (H) 12/23/2024   CREATININE 1.59 (H) 12/09/2024    elevated lactic acid >2     Component Value Date/Time   LATICACIDVEN 2.6 (HH) 12/23/2024 1952    acute metabolic encephalopathy   - Obtain serial lactic acid and procalcitonin level.  - Initiated IV antibiotics in ER:   Will continue  on : cefepime    - await results of blood and urine culture  - Rehydrate aggressively  Intravenous fluids were administered        30cc/kg fluid  11:09 PM  "

## 2024-12-23 NOTE — Consult Note (Signed)
 " Urology Consult   Physician requesting consult: Dr. Earley  Reason for consult: Urolithiasis status post recent ureteroscopy, fever  History of Present Illness: Tony Davis. is a 64 y.o.  with a medical history significant of hemorrhagic stroke basal ganglia hemorrhage in 2015 resulting in vascular dementia, seizure disorder on Keppra , CAD status post DES 2011, HTN, HLD, CKD 3b, hx of DVT status post IVC filter, neoplasm of left kidney,, BPH, history of kidney stones, diastolic CHF, hydrocephalus status post VP shunt in 2015   He is s/p right ureteroscopic laser lithotripsy and stone removal approximately 1 week ago.  His stent was then removed in the office yesterday.  He developed worsening confusion according to his family and was brought to the emergency department.  He was noted to be febrile up to 102.  He denies right-sided flank pain.  CT imaging was performed and demonstrated minimal residual stone burden with no hydronephrosis.   Past Medical History:  Diagnosis Date   Acute respiratory failure (HCC) 2015   a. Following BG hemorrhage - s/p trache with decannulation.   Basal ganglia hemorrhage (HCC) 2015   a. 2015 - with intraventricular extension and hydrocephalus requiring right crani and VP shunt, complicated by encephalopathy, protein calorie malnutrition, acute resp failure req trache and PEG, BLE DVT s/p IVC, enterobacter PNA, subsequent right hemiparesis and difficulty communicating.   BPH (benign prostatic hyperplasia)    CAD (coronary artery disease)    a. s/p promus DES circumflex artery 10/13/10. b. Cath 04/2013: stable disease but possible small vessel disease -Imdur  added.   Carotid artery disease    a. 1-29% BICA by study 04/2014.   CVA (cerebral vascular accident) (HCC) 02/2010   a. CVA 2011, prior reported TIAs. b. CNS hemorrhage 2015.   CVA (cerebral vascular accident) (HCC) 06/2014   hemorrhagic; in [place] ; brain activity diminshed; ability to do  anything; can't do ADLs on own   DVT (deep venous thrombosis) (HCC) 2013   a. 2013. b. 2015 - reported BLE DVT s/p IVC filter.    Enterobacter cloacae pneumonia (HCC) 2015   Falls    Folate deficiency anemia    GERD (gastroesophageal reflux disease)    Hematuria 07/2011   History of kidney stones    HLD (hyperlipidemia)    HTN (hypertension)    Hydrocephalus (HCC) 2015   a. 2/2 BG hemorrhage.   Incontinence of feces    Lower extremity edema    LV dysfunction    a. 2011 - EF 45-50%, subsequent 55-60% in 04/2014.   Noncompliance    Orthostatic hypotension    OSA on CPAP    not using CPAP right now (11/30/2014)   PFO (patent foramen ovale)    a. echo 8/12: EF 55-65%, grade 2 diast dysfnx; +PFO on bubble study. b. TEE 4/13: EF normal, atrial septum with suspicion for interatrial septum fenestrations without flow across and few large bubbles noted in LA.  This was not felt to require closure    Protein calorie malnutrition    Rheumatoid arthritis(714.0)    S/P percutaneous endoscopic gastrostomy (PEG) tube placement (HCC) 2015   TIA (transient ischemic attack)    1-2; both after the one in 02/2010 (09/25/2013)   Urinary incontinence     Past Surgical History:  Procedure Laterality Date   CORONARY ANGIOPLASTY WITH STENT PLACEMENT  12/10/2010   1 (09/24/2013)   CYSTOSCOPY/URETEROSCOPY/HOLMIUM LASER/STENT PLACEMENT Right 07/18/2023   Procedure: CYSTOSCOPY RIGHT URETEROSCOPY, HOLMIUM LASER LITHOTRIPSY, AND RIGHT URETERAL STENT  PLACEMENT;  Surgeon: Cam Morene ORN, MD;  Location: [facility];  Service: Urology;  Laterality: Right;  90 MINUTES   CYSTOSCOPY/URETEROSCOPY/HOLMIUM LASER/STENT PLACEMENT Right 12/17/2024   Procedure: CYSTOSCOPY/URETEROSCOPY/HOLMIUM LASER/STENT PLACEMENT;  Surgeon: Cam Morene ORN, MD;  Location: [facility];  Service: Urology;  Laterality: Right;  CYSTOSCOPY/RIGHT URETEROSCOPY/HOLMIUM LASER/STENT PLACEMENT   filter in place for clots      per wife at preop on  01/11/23   KNEE ARTHROSCOPY Bilateral 08/11/1999   2 on left and 3 on rt   LAPAROSCOPIC NEPHRECTOMY Left 01/18/2023   Procedure: LAPAROSCOPIC RADICAL LEFT NEPHRECTOMY;  Surgeon: Cam Morene ORN, MD;  Location: [facility];  Service: Urology;  Laterality: Left;  210 MINUTES NEDDED FOR CASE   LEFT HEART CATHETERIZATION WITH CORONARY ANGIOGRAM N/A 04/24/2013   Procedure: LEFT HEART CATHETERIZATION WITH CORONARY ANGIOGRAM;  Surgeon: Lonni JONETTA Cash, MD;  Location: [facility] CATH LAB;  Service: Cardiovascular;  Laterality: N/A;   shunt in head      TEE WITHOUT CARDIOVERSION  03/21/2012   Procedure: TRANSESOPHAGEAL ECHOCARDIOGRAM (TEE);  Surgeon: Vina LULLA Gull, MD;  Location[facility] ENDOSCOPY;  Service: Cardiovascular;  Laterality: N/A;    Current Hospital Medications:  Home Meds: Medications Ordered Prior to Encounter[1]   Scheduled Meds:  potassium chloride   40 mEq Oral BID   Continuous Infusions:  ceFEPime  (MAXIPIME ) IV     PRN Meds:.  Allergies: Allergies[2]  Family History  Problem Relation Age of Onset   Heart attack Father    Stroke Father    Heart attack Sister    Hypertension Brother    Seizures Sister    Stroke Sister    Cancer Neg Hx    Kidney disease Neg Hx    Diabetes Neg Hx     Social History:  reports that he has been smoking cigarettes. He started smoking about 47 years ago. He has a 35 pack-year smoking history. He has never used smokeless tobacco. He reports that he does not drink alcohol and does not use drugs.  ROS: A complete review of systems was performed.  All systems are negative except for pertinent findings as noted.  Physical Exam:  Vital signs in last 24 hours: Temp:  [99.7 F (37.6 C)-102.2 F (39 C)] 99.7 F (37.6 C) (01/14 2221) Pulse Rate:  [93] 93 (01/14 1921) Resp:  [22-25] 25 (01/14 1955) BP: (110-114)/(76-78) 114/78 (01/14 1955) SpO2:  [96 %] 96 % (01/14 1921) Constitutional:  Alert and oriented, No acute distress Cardiovascular: No  JVD Respiratory: Normal respiratory effort GI: Abdomen is soft, nontender, nondistended, no abdominal masses GU: No CVA tenderness Lymphatic: No lymphadenopathy Psychiatric: Normal mood and affect  Laboratory Data:  Recent Labs    12/23/24 1950  WBC 15.8*  HGB 14.2  HCT 43.9  PLT 197    Recent Labs    12/23/24 1950  NA 138  K 3.1*  CL 99  GLUCOSE 140*  BUN 32*  CALCIUM  9.6  CREATININE 3.46*     Results for orders placed or performed during the hospital encounter of 12/23/24 (from the past 24 hours)  Comprehensive metabolic panel     Status: Abnormal   Collection Time: 12/23/24  7:50 PM  Result Value Ref Range   Sodium 138 135 - 145 mmol/L   Potassium 3.1 (L) 3.5 - 5.1 mmol/L   Chloride 99 98 - 111 mmol/L   CO2 25 22 - 32 mmol/L   Glucose, Bld 140 (H) 70 - 99 mg/dL   BUN 32 (H) 8 -  23 mg/dL   Creatinine, Ser 6.53 (H) 0.61 - 1.24 mg/dL   Calcium  9.6 8.9 - 10.3 mg/dL   Total Protein 7.7 6.5 - 8.1 g/dL   Albumin  4.0 3.5 - 5.0 g/dL   AST 35 15 - 41 U/L   ALT 19 0 - 44 U/L   Alkaline Phosphatase 121 38 - 126 U/L   Total Bilirubin 1.3 (H) 0.0 - 1.2 mg/dL   GFR, Estimated 19 (L) >60 mL/min   Anion gap 15 5 - 15  CBC with Differential     Status: Abnormal   Collection Time: 12/23/24  7:50 PM  Result Value Ref Range   WBC 15.8 (H) 4.0 - 10.5 K/uL   RBC 4.68 4.22 - 5.81 MIL/uL   Hemoglobin 14.2 13.0 - 17.0 g/dL   HCT 56.0 60.9 - 47.9 %   MCV 93.8 80.0 - 100.0 fL   MCH 30.3 26.0 - 34.0 pg   MCHC 32.3 30.0 - 36.0 g/dL   RDW 86.7 88.4 - 84.4 %   Platelets 197 150 - 400 K/uL   nRBC 0.0 0.0 - 0.2 %   Neutrophils Relative % 82 %   Neutro Abs 13.2 (H) 1.7 - 7.7 K/uL   Lymphocytes Relative 6 %   Lymphs Abs 1.0 0.7 - 4.0 K/uL   Monocytes Relative 9 %   Monocytes Absolute 1.4 (H) 0.1 - 1.0 K/uL   Eosinophils Relative 1 %   Eosinophils Absolute 0.1 0.0 - 0.5 K/uL   Basophils Relative 1 %   Basophils Absolute 0.1 0.0 - 0.1 K/uL   Immature Granulocytes 1 %   Abs  Immature Granulocytes 0.08 (H) 0.00 - 0.07 K/uL  Protime-INR     Status: Abnormal   Collection Time: 12/23/24  7:50 PM  Result Value Ref Range   Prothrombin Time 15.5 (H) 11.4 - 15.2 seconds   INR 1.2 0.8 - 1.2  I-Stat Lactic Acid, ED     Status: Abnormal   Collection Time: 12/23/24  7:52 PM  Result Value Ref Range   Lactic Acid, Venous 2.6 (HH) 0.5 - 1.9 mmol/L   Comment NOTIFIED PHYSICIAN   Resp panel by RT-PCR (RSV, Flu A&B, Covid) Anterior Nasal Swab     Status: None   Collection Time: 12/23/24  9:10 PM   Specimen: Anterior Nasal Swab  Result Value Ref Range   SARS Coronavirus 2 by RT PCR NEGATIVE NEGATIVE   Influenza A by PCR NEGATIVE NEGATIVE   Influenza B by PCR NEGATIVE NEGATIVE   Resp Syncytial Virus by PCR NEGATIVE NEGATIVE  Urinalysis, w/ Reflex to Culture (Infection Suspected) -Urine, Clean Catch     Status: Abnormal   Collection Time: 12/23/24  9:59 PM  Result Value Ref Range   Specimen Source URINE, CLEAN CATCH    Color, Urine AMBER (A) YELLOW   APPearance HAZY (A) CLEAR   Specific Gravity, Urine 1.014 1.005 - 1.030   pH 8.0 5.0 - 8.0   Glucose, UA NEGATIVE NEGATIVE mg/dL   Hgb urine dipstick MODERATE (A) NEGATIVE   Bilirubin Urine NEGATIVE NEGATIVE   Ketones, ur NEGATIVE NEGATIVE mg/dL   Protein, ur >=699 (A) NEGATIVE mg/dL   Nitrite NEGATIVE NEGATIVE   Leukocytes,Ua LARGE (A) NEGATIVE   RBC / HPF >50 0 - 5 RBC/hpf   WBC, UA >50 0 - 5 WBC/hpf   Bacteria, UA FEW (A) NONE SEEN   Squamous Epithelial / HPF 0-5 0 - 5 /HPF   Mucus PRESENT    Recent Results (  from the past 240 hours)  Urine Culture     Status: Abnormal   Collection Time: 12/17/24  6:15 AM   Specimen: Urine, Clean Catch  Result Value Ref Range Status   Specimen Description   Final    URINE, CLEAN CATCH Performed at [facility], [address], [place], [place] [address]    Special Requests   Final    NONE Performed at [facility], [address]  [place], [place] [address]    Culture (A)  Final    <10,000 COLONIES/mL INSIGNIFICANT GROWTH Performed at [facility], [address] [place], [place] [address]    Report Status 12/18/2024 FINAL  Final  Resp panel by RT-PCR (RSV, Flu A&B, Covid) Anterior Nasal Swab     Status: None   Collection Time: 12/23/24  9:10 PM   Specimen: Anterior Nasal Swab  Result Value Ref Range Status   SARS Coronavirus 2 by RT PCR NEGATIVE NEGATIVE Final    Comment: (NOTE) SARS-CoV-2 target nucleic acids are NOT DETECTED.  The SARS-CoV-2 RNA is generally detectable in upper respiratory specimens during the acute phase of infection. The lowest concentration of SARS-CoV-2 viral copies this assay can detect is 138 copies/mL. A negative result does not preclude SARS-Cov-2 infection and should not be used as the sole basis for treatment or other patient management decisions. A negative result may occur with  improper specimen collection/handling, submission of specimen other than nasopharyngeal swab, presence of viral mutation(s) within the areas targeted by this assay, and inadequate number of viral copies(<138 copies/mL). A negative result must be combined with clinical observations, patient history, and epidemiological information. The expected result is Negative.  Fact Sheet for Patients:  bloggercourse.com  Fact Sheet for Healthcare Providers:  seriousbroker.it  This test is no t yet approved or cleared by the [place]  FDA and  has been authorized for detection and/or diagnosis of SARS-CoV-2 by FDA under an Emergency Use Authorization (EUA). This EUA will remain  in effect (meaning this test can be used) for the duration of the COVID-19 declaration under Section 564(b)(1) of the Act, 21 U.S.C.section 360bbb-3(b)(1), unless the authorization is terminated  or revoked sooner.       Influenza A by PCR NEGATIVE NEGATIVE Final   Influenza B  by PCR NEGATIVE NEGATIVE Final    Comment: (NOTE) The Xpert Xpress SARS-CoV-2/FLU/RSV plus assay is intended as an aid in the diagnosis of influenza from Nasopharyngeal swab specimens and should not be used as a sole basis for treatment. Nasal washings and aspirates are unacceptable for Xpert Xpress SARS-CoV-2/FLU/RSV testing.  Fact Sheet for Patients: bloggercourse.com  Fact Sheet for Healthcare Providers: seriousbroker.it  This test is not yet approved or cleared by the [place]  FDA and has been authorized for detection and/or diagnosis of SARS-CoV-2 by FDA under an Emergency Use Authorization (EUA). This EUA will remain in effect (meaning this test can be used) for the duration of the COVID-19 declaration under Section 564(b)(1) of the Act, 21 U.S.C. section 360bbb-3(b)(1), unless the authorization is terminated or revoked.     Resp Syncytial Virus by PCR NEGATIVE NEGATIVE Final    Comment: (NOTE) Fact Sheet for Patients: bloggercourse.com  Fact Sheet for Healthcare Providers: seriousbroker.it  This test is not yet approved or cleared by the [place]  FDA and has been authorized for detection and/or diagnosis of SARS-CoV-2 by FDA under an Emergency Use Authorization (EUA). This EUA will remain in effect (meaning this test  can be used) for the duration of the COVID-19 declaration under Section 564(b)(1) of the Act, 21 U.S.C. section 360bbb-3(b)(1), unless the authorization is terminated or revoked.  Performed at [facility], [address], [place], [place] [address]     Renal Function: Recent Labs    12/23/24 1950  CREATININE 3.46*   Estimated Creatinine Clearance: 27 mL/min (A) (by C-G formula based on SCr of 3.46 mg/dL (H)).  Radiologic Imaging: CT Renal Stone Study Result Date: 12/23/2024 EXAM: CT ABDOMEN AND PELVIS WITHOUT CONTRAST  12/23/2024 09:51:40 PM TECHNIQUE: CT of the abdomen and pelvis was performed without the administration of intravenous contrast. Multiplanar reformatted images are provided for review. Automated exposure control, iterative reconstruction, and/or weight-based adjustment of the mA/kV was utilized to reduce the radiation dose to as low as reasonably achievable. COMPARISON: 11/19/2024 CLINICAL HISTORY: Abdominal/flank pain, stone suspected. Weakness, anorexia, altered mental status. FINDINGS: LOWER CHEST: Ventricular peritoneal shunt catheter tube can be seen with its tip within the right anterior subdiaphragmatic region. Stable small pericardial effusion. LIVER: The liver is unremarkable. GALLBLADDER AND BILE DUCTS: Gallbladder is unremarkable. No biliary ductal dilatation. SPLEEN: No acute abnormality. PANCREAS: No acute abnormality. ADRENAL GLANDS: No acute abnormality. KIDNEYS, URETERS AND BLADDER: Right kidney: 8.6 cm simple cortical cyst within the interpolar region and 7.2 cm simple cortical cyst within the lower pole for which no follow up imaging is recommended. Multiple nonobstructing calculi are seen within the right kidney measuring up to 6 mm. No hydronephrosis. There is mild right perinephric and moderate periureteric inflammatory stranding. No perinephric fluid collection. Left kidney: Status post left nephrectomy. Bladder: Largely decompressed. There is, however, circumferential bladder wall thickening and extensive perivesicular inflammatory stranding suggesting a superimposed infectious or inflammatory cystitis. Together, the findings are most suggestive of infectious cystitis and ascending urinary tract infection / pyelonephritis. GI AND BOWEL: Stomach demonstrates no acute abnormality. There is no bowel obstruction. Tiny fat-containing umbilical hernia. PERITONEUM AND RETROPERITONEUM: No ascites. No free air. VASCULATURE: Inferior vena cava filter is in place. Relatively diminutive caliber of the  iliocaval venous vasculature and extensive body wall venous collaterals suggest chronic caval thrombosis, though this is not well assessed on this noncontrast examination. Mild aortoiliac atherosclerotic calcification. No aortic aneurysm. LYMPH NODES: No lymphadenopathy. REPRODUCTIVE ORGANS: No acute abnormality. BONES AND SOFT TISSUES: No acute osseous abnormality. No focal soft tissue abnormality. IMPRESSION: 1. Findings most suggestive of infectious cystitis with ascending urinary tract infection/pyelonephritis in the setting of multiple nonobstructing right renal calculi. No hydronephrosis. No perinephric fluid collection. 2. Status post left nephrectomy. 3. Ventriculoperitoneal shunt catheter with tip in the right anterior subdiaphragmatic region. 4. Stable small pericardial effusion. 5. Inferior vena cava filter in place with extensive body wall venous collaterals suggesting chronic caval thrombosis. 6. RAF score includes aortic atherosclerosis (ICD10-I70.0). Electronically signed by: Dorethia Molt MD 12/23/2024 10:01 PM EST RP Workstation: [id number]   DG Chest Port 1 View Result Date: 12/23/2024 EXAM: 1 VIEW(S) XRAY OF THE CHEST 12/23/2024 07:55:00 PM COMPARISON: 09/01/2024 CLINICAL HISTORY: Questionable sepsis - evaluate for abnormality FINDINGS: LINES, TUBES AND DEVICES: Right VP shunt tubing partially visible. LUNGS AND PLEURA: Low lung volumes. No focal pulmonary opacity. No pleural effusion. No pneumothorax. HEART AND MEDIASTINUM: No acute abnormality of the cardiac and mediastinal silhouettes. BONES AND SOFT TISSUES: No acute osseous abnormality. IMPRESSION: 1. No acute findings. Electronically signed by: Oneil Devonshire MD 12/23/2024 07:58 PM EST RP Workstation: [id number]    I independently reviewed the above imaging studies.  Impression/Recommendation 1.  Probable right pyelonephritis/AKI status post right ureteroscopy with laser lithotripsy 1 week ago: Although his clinical presentation is  nonspecific, he did have fever and certainly should be covered for possible pyelonephritis pending culture results.  He does not appear to have evidence of obstructive uropathy that would be contributing to his acute kidney injury based on his CT imaging.  Continue to monitor.  I will notify Dr. Cam of his admission.  Noretta Ferrara 12/23/2024, 10:54 PM    Gretel CANDIE Ferrara Teddie MD   CC: Dr. Earley      [1]  No current facility-administered medications on file prior to encounter.   Current Outpatient Medications on File Prior to Encounter  Medication Sig Dispense Refill   acetaminophen  (TYLENOL ) 325 MG tablet Take 650 mg by mouth every 6 (six) hours as needed for moderate pain.     atorvastatin  (LIPITOR ) 80 MG tablet TAKE ONE TABLET BY MOUTH DAILY (Patient taking differently: Take 80 mg by mouth daily.) 90 tablet 0   Cholecalciferol (VITAMIN D3) 25 MCG (1000 UT) CAPS Take 1,000 Units by mouth daily.     folic acid  (FOLVITE ) 1 MG tablet Take 1 tablet (1 mg total) by mouth daily. 90 tablet 0   hydrALAZINE  (APRESOLINE ) 100 MG tablet Take 100 mg by mouth 2 (two) times daily.     levETIRAcetam  (KEPPRA ) 500 MG tablet Take 1 tablet (500 mg total) by mouth 2 (two) times daily. 180 tablet 0   lisinopril  (ZESTRIL ) 40 MG tablet Take 1 tablet (40 mg total) by mouth daily. 90 tablet 0   melatonin 5 MG TABS Take 5 mg by mouth at bedtime.     memantine  (NAMENDA ) 10 MG tablet Take 10 mg by mouth 2 (two) times daily.     metoprolol  succinate (TOPROL -XL) 100 MG 24 hr tablet Take 1 tablet (100 mg total) by mouth 2 (two) times daily. Take with or immediately following a meal. 180 tablet 3   pantoprazole  (PROTONIX ) 40 MG tablet Take 1 tablet (40 mg total) by mouth daily. (Patient taking differently: Take 40 mg by mouth 2 (two) times daily before a meal.)     polyethylene glycol powder (GLYCOLAX/MIRALAX) 17 GM/SCOOP powder Take 17 g by mouth 2 (two) times daily as needed for mild constipation or moderate  constipation (Dissolve in 4-8 ounces of liquid). Dissolve 1 capful (17g) in 4-8 ounces of liquid     rivastigmine  (EXELON ) 6 MG capsule Take 6 mg by mouth 2 (two) times daily.     sodium fluoride (DENTA 5000 PLUS) 1.1 % CREA dental cream Place 1 Application onto teeth every evening.     tamsulosin  (FLOMAX ) 0.4 MG CAPS capsule Take 1 capsule (0.4 mg total) by mouth daily. 7 capsule 0   isosorbide  mononitrate (IMDUR ) 30 MG 24 hr tablet Take 0.5 tablets (15 mg total) by mouth daily. (Patient not taking: Reported on 12/23/2024) 45 tablet 0   traMADol  (ULTRAM ) 50 MG tablet Take 1-2 tablets (50-100 mg total) by mouth every 6 (six) hours as needed for moderate pain (pain score 4-6). (Patient not taking: Reported on 12/23/2024) 15 tablet 0  [2]  Allergies Allergen Reactions   Iodine Anaphylaxis   Norvasc  [Amlodipine ] Swelling and Other (See Comments)    Foot edema   Aricept [Donepezil] Other (See Comments)    Hallucinations   "

## 2024-12-23 NOTE — Assessment & Plan Note (Signed)
 Allow permissive HTN

## 2024-12-23 NOTE — Assessment & Plan Note (Signed)
 Avoid over aggressive fluid overload

## 2024-12-23 NOTE — Assessment & Plan Note (Signed)
 Continue Lipitor  80 mg po

## 2024-12-23 NOTE — Assessment & Plan Note (Signed)
 Continue Lipitor 80 mg ?

## 2024-12-23 NOTE — ED Provider Triage Note (Addendum)
 Emergency Medicine Provider Triage Evaluation Note  Tony Davis. , a 64 y.o. male  was evaluated in triage.  Pt complains of dehydration and weakness.  Was seen by PCP and advised that his electrolytes were all off today and advised to come to the emergency department.  No measured fevers at home but temperature has been around 99.0 F based on home readings.  Reportedly had cystoscopy, ureteral stenting performed 6 days ago after concerns for nephrolithiasis.  Review of Systems  Positive: As above Negative: As above  Physical Exam  There were no vitals taken for this visit. Gen:   Awake, no distress   Resp:  Normal effort  MSK:   Moves extremities without difficulty  Other:    Medical Decision Making  Medically screening exam initiated at 7:20 PM.  Appropriate orders placed.  Tony Davis. was informed that the remainder of the evaluation will be completed by another provider, this initial triage assessment does not replace that evaluation, and the importance of remaining in the ED until their evaluation is complete.    Cecily Legrand LABOR, PA-C 12/23/24 1921   Rectal temperature elevated at 102.8F. Sepsis orders placed.   Cecile Gillispie A, PA-C 12/23/24 1928

## 2024-12-23 NOTE — Assessment & Plan Note (Signed)
-  chronic avoid nephrotoxic medications such as NSAIDs, Vanco Zosyn combo,  avoid hypotension, continue to follow renal function

## 2024-12-23 NOTE — ED Provider Notes (Signed)
 " [place] EMERGENCY DEPARTMENT AT [facility] Provider Note   CSN: [id number] Arrival date & time: 12/23/24  8167     Patient presents with: No chief complaint on file.   Tony Davis. is a 64 y.o. male with PMHx of HLD, CAD, PFO, hemorrhagic CVA 2015, dementia, hydrocephalus s/p shunt, diastolic dysfunction HF, CKD stage 3, left nephrectomy presents to Emergency Department with wife for evaluation of AMS.  Patient is status post 26 cm x 6 French double-J ureteral stent  with Dr. Cam with [facility] on 12/17/24. Had stent removed by urology today.  At baseline, patient is typically only able to speak 1-3 words at a time and is typically oriented to self and his birthday.  He does not normally know current year and occasionally will forget family's names.  Patient's wife reports patient started acting disoriented yesterday at 36.  He has been unable to eat wo assistance, not talking at all, staring off, increased fatigue which is not per his baseline.  He wears depends chronically and typically is changed 4-5 times a day.  Amount of depends has been normal since surgery.  Patient took Cipro  for infection prophylaxis prior to surgery and was supposed to restart them today for continued prophylaxis.  He did not take any Cipro  today as he is currently being evaluated in ED.    HPI     Prior to Admission medications  Medication Sig Start Date End Date Taking? Authorizing Provider  acetaminophen  (TYLENOL ) 325 MG tablet Take 650 mg by mouth every 6 (six) hours as needed for moderate pain.    [provider]  atorvastatin  (LIPITOR ) 80 MG tablet TAKE ONE TABLET BY MOUTH DAILY 08/16/18   Joshua Debby CROME, MD  Cholecalciferol (VITAMIN D3) 25 MCG (1000 UT) CAPS Take 1,000 Units by mouth daily.    [provider]  escitalopram  (LEXAPRO ) 20 MG tablet Take 20 mg by mouth daily.    [provider]  folic acid  (FOLVITE ) 1 MG tablet Take 1 tablet (1 mg  total) by mouth daily. 07/16/24   Danford, Lonni SQUIBB, MD  hydrALAZINE  (APRESOLINE ) 100 MG tablet Take 100 mg by mouth 2 (two) times daily.    [provider]  isosorbide  mononitrate (IMDUR ) 30 MG 24 hr tablet Take 0.5 tablets (15 mg total) by mouth daily. 07/16/24   Danford, Lonni SQUIBB, MD  levETIRAcetam  (KEPPRA ) 500 MG tablet Take 1 tablet (500 mg total) by mouth 2 (two) times daily. 07/15/24   Danford, Lonni SQUIBB, MD  lisinopril  (ZESTRIL ) 40 MG tablet Take 1 tablet (40 mg total) by mouth daily. 07/16/24   Danford, Lonni SQUIBB, MD  Loratadine 10 MG CHEW Chew 10 mg by mouth daily.    [provider]  melatonin 5 MG TABS Take 5 mg by mouth at bedtime.    [provider]  memantine  (NAMENDA ) 10 MG tablet Take 10 mg by mouth 2 (two) times daily. 10/30/21   [provider]  metoprolol  succinate (TOPROL -XL) 100 MG 24 hr tablet Take 1 tablet (100 mg total) by mouth 2 (two) times daily. Take with or immediately following a meal. 12/25/21   Verlin Lonni BIRCH, MD  pantoprazole  (PROTONIX ) 40 MG tablet Take 1 tablet (40 mg total) by mouth daily. 09/27/23   Ghimire, Donalda HERO, MD  rivastigmine  (EXELON ) 6 MG capsule Take 6 mg by mouth 2 (two) times daily. 11/11/21   [provider]  sodium fluoride (DENTA 5000 PLUS) 1.1 % CREA  dental cream Place 1 Application onto teeth every evening.    [provider]  tamsulosin  (FLOMAX ) 0.4 MG CAPS capsule Take 1 capsule (0.4 mg total) by mouth daily. 12/17/24   Cam Morene ORN, MD  traMADol  (ULTRAM ) 50 MG tablet Take 1-2 tablets (50-100 mg total) by mouth every 6 (six) hours as needed for moderate pain (pain score 4-6). 12/17/24   Cam Morene ORN, MD    Allergies: Iodine, Norvasc  [amlodipine ], and Aricept [donepezil]    Review of Systems  Constitutional:  Positive for fever.    Updated Vital Signs BP 114/78   Pulse 93   Temp 99.7 F (37.6 C) (Rectal)   Resp (!) 25   SpO2 96%   Physical  Exam Vitals and nursing note reviewed.  Constitutional:      General: He is not in acute distress.    Appearance: Normal appearance.  HENT:     Head: Normocephalic and atraumatic.  Eyes:     Conjunctiva/sclera: Conjunctivae normal.  Cardiovascular:     Rate and Rhythm: Normal rate.  Pulmonary:     Effort: Pulmonary effort is normal. No respiratory distress.  Abdominal:     General: Bowel sounds are normal. There is no distension.     Palpations: Abdomen is soft.     Tenderness: There is no guarding or rebound.     Comments: Mild generalized tenderness. No peritoneal signs  Musculoskeletal:     Right lower leg: No edema.     Left lower leg: No edema.  Skin:    Coloration: Skin is not jaundiced or pale.  Neurological:     Mental Status: He is alert.     Comments: Alert to self. Following commands appropriately. No obvious focal motor deficit. Unable to assess sensory     (all labs ordered are listed, but only abnormal results are displayed) Labs Reviewed  COMPREHENSIVE METABOLIC PANEL WITH GFR - Abnormal; Notable for the following components:      Result Value   Potassium 3.1 (*)    Glucose, Bld 140 (*)    BUN 32 (*)    Creatinine, Ser 3.46 (*)    Total Bilirubin 1.3 (*)    GFR, Estimated 19 (*)    All other components within normal limits  CBC WITH DIFFERENTIAL/PLATELET - Abnormal; Notable for the following components:   WBC 15.8 (*)    Neutro Abs 13.2 (*)    Monocytes Absolute 1.4 (*)    Abs Immature Granulocytes 0.08 (*)    All other components within normal limits  PROTIME-INR - Abnormal; Notable for the following components:   Prothrombin Time 15.5 (*)    All other components within normal limits  I-STAT CG4 LACTIC ACID, ED - Abnormal; Notable for the following components:   Lactic Acid, Venous 2.6 (*)    All other components within normal limits  RESP PANEL BY RT-PCR (RSV, FLU A&B, COVID)  RVPGX2  CULTURE, BLOOD (ROUTINE X 2)  CULTURE, BLOOD (ROUTINE X 2)   URINE CULTURE  URINALYSIS, W/ REFLEX TO CULTURE (INFECTION SUSPECTED)  I-STAT CG4 LACTIC ACID, ED    EKG: None  Radiology: CT Renal Stone Study Result Date: 12/23/2024 EXAM: CT ABDOMEN AND PELVIS WITHOUT CONTRAST 12/23/2024 09:51:40 PM TECHNIQUE: CT of the abdomen and pelvis was performed without the administration of intravenous contrast. Multiplanar reformatted images are provided for review. Automated exposure control, iterative reconstruction, and/or weight-based adjustment of the mA/kV was utilized to reduce the radiation dose to as low as reasonably achievable.  COMPARISON: 11/19/2024 CLINICAL HISTORY: Abdominal/flank pain, stone suspected. Weakness, anorexia, altered mental status. FINDINGS: LOWER CHEST: Ventricular peritoneal shunt catheter tube can be seen with its tip within the right anterior subdiaphragmatic region. Stable small pericardial effusion. LIVER: The liver is unremarkable. GALLBLADDER AND BILE DUCTS: Gallbladder is unremarkable. No biliary ductal dilatation. SPLEEN: No acute abnormality. PANCREAS: No acute abnormality. ADRENAL GLANDS: No acute abnormality. KIDNEYS, URETERS AND BLADDER: Right kidney: 8.6 cm simple cortical cyst within the interpolar region and 7.2 cm simple cortical cyst within the lower pole for which no follow up imaging is recommended. Multiple nonobstructing calculi are seen within the right kidney measuring up to 6 mm. No hydronephrosis. There is mild right perinephric and moderate periureteric inflammatory stranding. No perinephric fluid collection. Left kidney: Status post left nephrectomy. Bladder: Largely decompressed. There is, however, circumferential bladder wall thickening and extensive perivesicular inflammatory stranding suggesting a superimposed infectious or inflammatory cystitis. Together, the findings are most suggestive of infectious cystitis and ascending urinary tract infection / pyelonephritis. GI AND BOWEL: Stomach demonstrates no acute  abnormality. There is no bowel obstruction. Tiny fat-containing umbilical hernia. PERITONEUM AND RETROPERITONEUM: No ascites. No free air. VASCULATURE: Inferior vena cava filter is in place. Relatively diminutive caliber of the iliocaval venous vasculature and extensive body wall venous collaterals suggest chronic caval thrombosis, though this is not well assessed on this noncontrast examination. Mild aortoiliac atherosclerotic calcification. No aortic aneurysm. LYMPH NODES: No lymphadenopathy. REPRODUCTIVE ORGANS: No acute abnormality. BONES AND SOFT TISSUES: No acute osseous abnormality. No focal soft tissue abnormality. IMPRESSION: 1. Findings most suggestive of infectious cystitis with ascending urinary tract infection/pyelonephritis in the setting of multiple nonobstructing right renal calculi. No hydronephrosis. No perinephric fluid collection. 2. Status post left nephrectomy. 3. Ventriculoperitoneal shunt catheter with tip in the right anterior subdiaphragmatic region. 4. Stable small pericardial effusion. 5. Inferior vena cava filter in place with extensive body wall venous collaterals suggesting chronic caval thrombosis. 6. RAF score includes aortic atherosclerosis (ICD10-I70.0). Electronically signed by: Dorethia Molt MD 12/23/2024 10:01 PM EST RP Workstation: [id number]   DG Chest Port 1 View Result Date: 12/23/2024 EXAM: 1 VIEW(S) XRAY OF THE CHEST 12/23/2024 07:55:00 PM COMPARISON: 09/01/2024 CLINICAL HISTORY: Questionable sepsis - evaluate for abnormality FINDINGS: LINES, TUBES AND DEVICES: Right VP shunt tubing partially visible. LUNGS AND PLEURA: Low lung volumes. No focal pulmonary opacity. No pleural effusion. No pneumothorax. HEART AND MEDIASTINUM: No acute abnormality of the cardiac and mediastinal silhouettes. BONES AND SOFT TISSUES: No acute osseous abnormality. IMPRESSION: 1. No acute findings. Electronically signed by: Oneil Devonshire MD 12/23/2024 07:58 PM EST RP Workstation: [id number]      .Critical Care  Performed by: Minnie Tinnie BRAVO, PA Authorized by: Minnie Tinnie BRAVO, PA   Critical care provider statement:    Critical care time (minutes):  41   Critical care was necessary to treat or prevent imminent or life-threatening deterioration of the following conditions:  Renal failure and sepsis   Critical care was time spent personally by me on the following activities:  Development of treatment plan with patient or surrogate, discussions with consultants, evaluation of patient's response to treatment, examination of patient, ordering and review of laboratory studies, ordering and review of radiographic studies, ordering and performing treatments and interventions, pulse oximetry, re-evaluation of patient's condition and review of old charts   Care discussed with: admitting provider      Medications Ordered in the ED  potassium chloride  (KLOR-CON ) packet 40 mEq (has no administration in time range)  ceFEPIme  (MAXIPIME )  2 g in sodium chloride  0.9 % 100 mL IVPB (has no administration in time range)  acetaminophen  (TYLENOL ) tablet 1,000 mg (1,000 mg Oral Given 12/23/24 2005)  lactated ringers  bolus 1,000 mL (1,000 mLs Intravenous New Bag/Given 12/23/24 2103)                                    Medical Decision Making Amount and/or Complexity of Data Reviewed Labs: ordered. Radiology: ordered.  Risk Prescription drug management. Decision regarding hospitalization.   Patient presents to the ED for concern of AMS, this involves an extensive number of treatment options, and is a complaint that carries with it a high risk of complications and morbidity.  The differential diagnosis includes UTI, stone, pyelo, urosepsis, sepsis, CVA/TIA, electrolyte abnormality, dehydration   Co morbidities that complicate the patient evaluation  Shunt, hydrocephalus, dementia, CVA Status post left nephrectomy, recent shunt placement, stones   Additional history obtained:  Additional  history obtained from [facility], Nursing, and Outside Medical Records   External records from outside source obtained and reviewed including triage RN note, urology procedure note from 12/17/2024, wife at bedside   Lab Tests:  I Ordered, and personally interpreted labs.  The pertinent results include:   WBC 15.8 lactic acid 2.6 Potassium 3.1 CBG 140 Creatinine 3.46 BUN 32 Respiratory panel negative UA with hgb, WBC, leuks   Imaging Studies ordered:  I ordered imaging studies including CT renal stone study, CXR I independently visualized and interpreted imaging which showed  CXR: No acute cardiopulmonary pathology CT: Findings most suggestive of infectious cystitis with ascending urinary tract infection/pyelonephritis in the setting of multiple nonobstructing right renal calculi. No hydronephrosis. No perinephric fluid collection. Status post left nephrectomy. Ventriculoperitoneal shunt catheter with tip in the right anterior subdiaphragmatic region. Stable small pericardial effusion. Inferior vena cava filter in place with extensive body wall venous collaterals suggesting chronic caval thrombosis I agree with the radiologist interpretation   Cardiac Monitoring:  The patient was maintained on a cardiac monitor.  I personally viewed and interpreted the cardiac monitored which showed an underlying rhythm of: NSR with no acute ischemic changes   Medicines ordered and prescription drug management:  I ordered medication including LR, potassium, tylenol , cefepime   for UTI/pyelo  Reevaluation of the patient after these medicines showed that the patient improved I have reviewed the patients home medicines and have made adjustments as needed    Critical Interventions:  Urosepsis with AKI   Consultations Obtained:  I requested consultation with hospitalist,  and discussed lab and imaging findings as well as pertinent plan - Dr. Silvester accepts patient for admission I requested  consultation with urology Dr. Renda,  and discussed lab and imaging findings as well as pertinent plan - they will consult on patient during admission   Problem List / ED Course:  Sepsis Resp panel negative CXR negative for PNA  Acute encephalopathy  Patient is only alert to self and occasionally follows commands appropriately.  No obvious focal deficits on exam. Low suspicion for CVA/TIA Patient's wife at bedside reports that he is not at his baseline.  He is needing complete assistance with ADLs which is not normal and has not been talkative Likely 2/2 to urosepsis  Pyelonephritis Vital signs notable for fever of 102.2 F.  Provided Tylenol  which improved temperature of 99.7 Mild abd tenderness Patient is status post urethral stent placement and removal this past week.  Strong suspicion  for urosepsis. Did obtain renal stone study as patient is unable to receive contrasted study d/t CKD WBC 15.8 lactic acid 2.6.  Provided LR 1 L slowly as patient has a history of diastolic dysfunction with heart failure. Repeat lactic is 1.7 following IVF UA notable for moderate Hgb, protein, large leukocytes, RBC, WBC, few bacteria.  Will send urine culture Provided cefepime  as patient recently was on Cipro  prophylactically for infection  AKI Was 1.59 two weeks ago.  Currently 3.46 LR 1000cc in ED  Hypokalemia 3.1 Provided potassium packet for supplementation   Reevaluation:  After the interventions noted above, I reevaluated the patient and found that they have :improved    Dispostion:  After consideration of the diagnostic results and the patients response to treatment, I feel that the patent would benefit from admission for sepsis, IV antibiotics.   Discussed ED workup with patient, patient's wife who expressed understanding agrees with plan.  All questions answered to her satisfaction.  Final diagnoses:  Sepsis, due to unspecified organism, unspecified whether acute organ  dysfunction present (HCC)  Hypokalemia  AKI (acute kidney injury)  Acute encephalopathy due to infection    ED Discharge Orders     None        Minnie Tinnie BRAVO, PA 12/24/24 0031    Emil Share, DO 12/24/24 1559  "

## 2024-12-23 NOTE — ED Triage Notes (Signed)
 Patient BIB from home due to not himself per wife. Patient is not walking, weak, and not eating/drinking. Rectal temp 102.2

## 2024-12-23 NOTE — Assessment & Plan Note (Signed)
-   will replace electrolytes and repeat  check Mg, phos and Ca level and replace as needed Monitor on telemetry   Lab Results  Component Value Date   K 3.1 (L) 12/23/2024     Lab Results  Component Value Date   CREATININE 3.46 (H) 12/23/2024   Lab Results  Component Value Date   MG 1.9 07/12/2024   Lab Results  Component Value Date   CALCIUM  9.6 12/23/2024   PHOS 2.7 09/22/2023

## 2024-12-23 NOTE — Subjective & Objective (Signed)
 Patient with hx of right Ureteral stent by Cam on 12/17/2024 and it got pulled by Urology today Comes in to day with confusion has hx of Dmentia/CVA at baseline confused but oriented to self and place and family Today not eating, does not know his family and that is not normal for him  He has been started on Cipro  for Prophylaxis Fever 102.2 WBC 15.8 lactic Acid 2.6 CT showed pyelonephritis no hydro He only has one kidney Noted to have AKI Cr up to 3.46 at baseline 1.6

## 2024-12-24 ENCOUNTER — Inpatient Hospital Stay (HOSPITAL_COMMUNITY): Admit: 2024-12-24 | Discharge: 2024-12-24 | Disposition: A | Payer: Medicare (Managed Care)

## 2024-12-24 ENCOUNTER — Inpatient Hospital Stay (HOSPITAL_COMMUNITY): Payer: Medicare (Managed Care)

## 2024-12-24 ENCOUNTER — Other Ambulatory Visit: Payer: Self-pay

## 2024-12-24 ENCOUNTER — Encounter (HOSPITAL_COMMUNITY): Payer: Self-pay | Admitting: Internal Medicine

## 2024-12-24 DIAGNOSIS — I10 Essential (primary) hypertension: Secondary | ICD-10-CM | POA: Diagnosis not present

## 2024-12-24 DIAGNOSIS — R569 Unspecified convulsions: Secondary | ICD-10-CM

## 2024-12-24 DIAGNOSIS — N3001 Acute cystitis with hematuria: Secondary | ICD-10-CM | POA: Diagnosis not present

## 2024-12-24 DIAGNOSIS — A419 Sepsis, unspecified organism: Secondary | ICD-10-CM | POA: Diagnosis not present

## 2024-12-24 DIAGNOSIS — I5032 Chronic diastolic (congestive) heart failure: Secondary | ICD-10-CM

## 2024-12-24 DIAGNOSIS — G9349 Other encephalopathy: Secondary | ICD-10-CM | POA: Diagnosis not present

## 2024-12-24 DIAGNOSIS — N179 Acute kidney failure, unspecified: Secondary | ICD-10-CM | POA: Diagnosis not present

## 2024-12-24 DIAGNOSIS — R4182 Altered mental status, unspecified: Secondary | ICD-10-CM

## 2024-12-24 DIAGNOSIS — F01A0 Vascular dementia, mild, without behavioral disturbance, psychotic disturbance, mood disturbance, and anxiety: Secondary | ICD-10-CM

## 2024-12-24 DIAGNOSIS — I2510 Atherosclerotic heart disease of native coronary artery without angina pectoris: Secondary | ICD-10-CM | POA: Diagnosis not present

## 2024-12-24 DIAGNOSIS — E876 Hypokalemia: Secondary | ICD-10-CM

## 2024-12-24 DIAGNOSIS — R6520 Severe sepsis without septic shock: Secondary | ICD-10-CM

## 2024-12-24 DIAGNOSIS — B999 Unspecified infectious disease: Secondary | ICD-10-CM

## 2024-12-24 DIAGNOSIS — E785 Hyperlipidemia, unspecified: Secondary | ICD-10-CM

## 2024-12-24 DIAGNOSIS — R9431 Abnormal electrocardiogram [ECG] [EKG]: Secondary | ICD-10-CM | POA: Diagnosis present

## 2024-12-24 LAB — BASIC METABOLIC PANEL WITH GFR
Anion gap: 9 (ref 5–15)
BUN: 33 mg/dL — ABNORMAL HIGH (ref 8–23)
CO2: 30 mmol/L (ref 22–32)
Calcium: 10.2 mg/dL (ref 8.9–10.3)
Chloride: 101 mmol/L (ref 98–111)
Creatinine, Ser: 3.42 mg/dL — ABNORMAL HIGH (ref 0.61–1.24)
GFR, Estimated: 20 mL/min — ABNORMAL LOW
Glucose, Bld: 112 mg/dL — ABNORMAL HIGH (ref 70–99)
Potassium: 3.3 mmol/L — ABNORMAL LOW (ref 3.5–5.1)
Sodium: 140 mmol/L (ref 135–145)

## 2024-12-24 LAB — CBC
HCT: 39.7 % (ref 39.0–52.0)
Hemoglobin: 12.7 g/dL — ABNORMAL LOW (ref 13.0–17.0)
MCH: 30.2 pg (ref 26.0–34.0)
MCHC: 32 g/dL (ref 30.0–36.0)
MCV: 94.3 fL (ref 80.0–100.0)
Platelets: 164 K/uL (ref 150–400)
RBC: 4.21 MIL/uL — ABNORMAL LOW (ref 4.22–5.81)
RDW: 13.2 % (ref 11.5–15.5)
WBC: 13.7 K/uL — ABNORMAL HIGH (ref 4.0–10.5)
nRBC: 0 % (ref 0.0–0.2)

## 2024-12-24 LAB — BLOOD GAS, VENOUS
Acid-Base Excess: 8 mmol/L — ABNORMAL HIGH (ref 0.0–2.0)
Bicarbonate: 34.7 mmol/L — ABNORMAL HIGH (ref 20.0–28.0)
O2 Saturation: 35.5 %
Patient temperature: 37
pCO2, Ven: 56 mmHg (ref 44–60)
pH, Ven: 7.4 (ref 7.25–7.43)
pO2, Ven: <31 mmHg — CL (ref 32–45)

## 2024-12-24 LAB — LACTIC ACID, PLASMA
Lactic Acid, Venous: 1.7 mmol/L (ref 0.5–1.9)
Lactic Acid, Venous: 0.9 mmol/L (ref 0.5–1.9)

## 2024-12-24 LAB — CREATININE, URINE, RANDOM: Creatinine, Urine: 253 mg/dL

## 2024-12-24 LAB — PROCALCITONIN: Procalcitonin: 1.34 ng/mL

## 2024-12-24 LAB — CK: Total CK: 128 U/L (ref 49–397)

## 2024-12-24 LAB — MAGNESIUM: Magnesium: 1.6 mg/dL — ABNORMAL LOW (ref 1.7–2.4)

## 2024-12-24 LAB — TSH: TSH: 0.934 u[IU]/mL (ref 0.350–4.500)

## 2024-12-24 LAB — AMMONIA: Ammonia: 14 umol/L (ref 9–35)

## 2024-12-24 LAB — OSMOLALITY: Osmolality: 301 mosm/kg — ABNORMAL HIGH (ref 275–295)

## 2024-12-24 LAB — OSMOLALITY, URINE: Osmolality, Ur: 363 mosm/kg (ref 300–900)

## 2024-12-24 LAB — SODIUM, URINE, RANDOM: Sodium, Ur: <30 mmol/L

## 2024-12-24 LAB — PREALBUMIN: Prealbumin: 11 mg/dL — ABNORMAL LOW (ref 18–38)

## 2024-12-24 LAB — PHOSPHORUS: Phosphorus: 1.8 mg/dL — ABNORMAL LOW (ref 2.5–4.6)

## 2024-12-24 MED ORDER — MAGNESIUM SULFATE 2 GM/50ML IV SOLN
2.0000 g | Freq: Once | INTRAVENOUS | Status: AC
  Administered 2024-12-24: 2 g via INTRAVENOUS
  Filled 2024-12-24: qty 50

## 2024-12-24 MED ORDER — PANTOPRAZOLE SODIUM 40 MG PO TBEC
40.0000 mg | DELAYED_RELEASE_TABLET | Freq: Two times a day (BID) | ORAL | Status: DC
  Administered 2024-12-24 – 2024-12-26 (×5): 40 mg via ORAL
  Filled 2024-12-24 (×5): qty 1

## 2024-12-24 MED ORDER — ACETAMINOPHEN 325 MG PO TABS: 650.0000 mg | ORAL_TABLET | Freq: Four times a day (QID) | ORAL | Status: DC | PRN

## 2024-12-24 MED ORDER — SODIUM CHLORIDE 0.9 % IV SOLN: INTRAVENOUS | Status: DC

## 2024-12-24 MED ORDER — RIVASTIGMINE TARTRATE 3 MG PO CAPS
6.0000 mg | ORAL_CAPSULE | Freq: Two times a day (BID) | ORAL | Status: DC
  Administered 2024-12-24 – 2024-12-26 (×5): 6 mg via ORAL
  Filled 2024-12-24 (×5): qty 2

## 2024-12-24 MED ORDER — CLONIDINE HCL 0.1 MG PO TABS: 0.1000 mg | ORAL_TABLET | Freq: Three times a day (TID) | ORAL | Status: DC | PRN

## 2024-12-24 MED ORDER — SODIUM CHLORIDE 0.9 % IV SOLN: 2.0000 g | INTRAVENOUS | Status: DC

## 2024-12-24 MED ORDER — LEVETIRACETAM 500 MG PO TABS
500.0000 mg | ORAL_TABLET | Freq: Two times a day (BID) | ORAL | Status: DC
  Administered 2024-12-24 – 2024-12-26 (×5): 500 mg via ORAL
  Filled 2024-12-24 (×5): qty 1

## 2024-12-24 MED ORDER — METOPROLOL SUCCINATE ER 100 MG PO TB24
100.0000 mg | ORAL_TABLET | Freq: Two times a day (BID) | ORAL | Status: DC
  Administered 2024-12-24 – 2024-12-26 (×5): 100 mg via ORAL
  Filled 2024-12-24: qty 1
  Filled 2024-12-24: qty 2
  Filled 2024-12-24 (×3): qty 1

## 2024-12-24 MED ORDER — TAMSULOSIN HCL 0.4 MG PO CAPS
0.4000 mg | ORAL_CAPSULE | Freq: Every day | ORAL | Status: DC
  Administered 2024-12-24 – 2024-12-26 (×3): 0.4 mg via ORAL
  Filled 2024-12-24 (×3): qty 1

## 2024-12-24 MED ORDER — PANTOPRAZOLE SODIUM 40 MG IV SOLR
40.0000 mg | Freq: Two times a day (BID) | INTRAVENOUS | Status: DC
  Administered 2024-12-24: 40 mg via INTRAVENOUS
  Filled 2024-12-24: qty 10

## 2024-12-24 MED ORDER — HYDRALAZINE HCL 50 MG PO TABS
100.0000 mg | ORAL_TABLET | Freq: Two times a day (BID) | ORAL | Status: DC
  Administered 2024-12-24 – 2024-12-26 (×5): 100 mg via ORAL
  Filled 2024-12-24 (×6): qty 2

## 2024-12-24 MED ORDER — POTASSIUM PHOSPHATES 15 MMOLE/5ML IV SOLN
15.0000 mmol | Freq: Once | INTRAVENOUS | Status: AC
  Administered 2024-12-24: 15 mmol via INTRAVENOUS
  Filled 2024-12-24: qty 5

## 2024-12-24 MED ORDER — HYDROCODONE-ACETAMINOPHEN 5-325 MG PO TABS: 1.0000 | ORAL_TABLET | ORAL | Status: DC | PRN

## 2024-12-24 MED ORDER — ALBUTEROL SULFATE (2.5 MG/3ML) 0.083% IN NEBU: 2.5000 mg | INHALATION_SOLUTION | RESPIRATORY_TRACT | Status: DC | PRN

## 2024-12-24 MED ORDER — ATORVASTATIN CALCIUM 40 MG PO TABS
80.0000 mg | ORAL_TABLET | Freq: Every day | ORAL | Status: DC
  Administered 2024-12-24 – 2024-12-26 (×3): 80 mg via ORAL
  Filled 2024-12-24 (×3): qty 2

## 2024-12-24 MED ORDER — LEVETIRACETAM (KEPPRA) 500 MG/5 ML ADULT IV PUSH
500.0000 mg | Freq: Two times a day (BID) | INTRAVENOUS | Status: DC
  Administered 2024-12-24: 500 mg via INTRAVENOUS
  Filled 2024-12-24: qty 5

## 2024-12-24 MED ORDER — ACETAMINOPHEN 650 MG RE SUPP: 650.0000 mg | Freq: Four times a day (QID) | RECTAL | Status: DC | PRN

## 2024-12-24 MED ORDER — SODIUM CHLORIDE 0.9 % IV SOLN
2.0000 g | INTRAVENOUS | Status: DC
  Administered 2024-12-24 – 2024-12-25 (×2): 2 g via INTRAVENOUS
  Filled 2024-12-24 (×2): qty 20

## 2024-12-24 NOTE — Assessment & Plan Note (Addendum)
 Continue rocephin   Obtain urine culture ER provider discussed case with urology Dr. Renda who is aware of the patient Patient had recent nephrostomy tube removal CT scan at this point showing no hydro

## 2024-12-24 NOTE — Assessment & Plan Note (Signed)
 Obtain electrolytes rehydrate follow renal function

## 2024-12-24 NOTE — Assessment & Plan Note (Signed)
 Will replace

## 2024-12-24 NOTE — Progress Notes (Signed)
 " [facility] Progress Note  Patient: Tony Davis.     FMW:[id number]  DOA: 12/23/2024   PCP: [name], DO       Brief hospital course: 64 year old male with significant past medical history including hemorrhagic basal ganglia stroke (2015) with residual vascular dementia, seizure disorder, CAD s/p DES (2011), CKD stage IIIb, left nephrectomy, right ureteral stent placed 12/17/24 and removed earlier today, diastolic CHF, and VP shunt who presents with acute worsening confusion.  Per family, at baseline patient recognizes family, ambulates with walker, and can express needs. Over the last 24 hours, he developed progressive confusion, decreased responsiveness, poor oral intake, and felt warm to touch. He was evaluated by [facility] without clear etiology. Earlier today, he was seen by urology and had his right ureteral stent removed, after which confusion worsened. Family reports a history of non-tonic-clonic (absence-like) seizures and were concerned about seizure activity.  In the ED, patient was febrile to 102.2F, tachypneic, leukocytic, and had AKI (Cr 3.46 from baseline ~1.6) with lactic acidosis (2.6). CT abdomen/pelvis showed infectious cystitis with ascending UTI/pyelonephritis in the setting of non-obstructing right renal calculi. He has only one kidney (s/p left nephrectomy). UA consistent with UTI.  Subjective:  Sitting up in bed and states he feels ok.   Assessment and Plan:  Principal Problem:  Sepsis related to Pyelonephritis - possibly related to removal of urinary stent  - cont CTX and f/u on cultures  Active Problems: Acute metabolic encephalopathy with h/o vascular dementia - due to above - poorly responsive initially but now very alert and told me that he is in the hospital for a stroke - he does have dementia - takes Namenda  at baseline      Seizure disorder - cont Keppra   AKI with CKD stage 3a, dehydrated Left sided nephrectomy - Cr 3.46 when  admitted- baseline is about 1.5 - sOsm elevated at 301 - ACE I on hold - IVF being given  Hypokalemia, hypomagnesemia, hypophosphatemia - K 3.3 - Phos 1.6 - Mg 1.6 - replacing  HTN - cont Hydralazine  and Toprol  - Lisinopril  on hold due to AKI    QT prolongation - QT 492- may improve once electrolytes replaced     Code Status: Full Code Total time on patient care: 35 min DVT prophylaxis:  SCDs     Objective:   Vitals:   12/24/24 0400 12/24/24 0500 12/24/24 0619 12/24/24 0700  BP: (!) 152/94 (!) 169/96  (!) 187/95  Pulse: 87 88 89 89  Resp: (!) 21 (!) 21 (!) 25 (!) 24  Temp:   98.3 F (36.8 C)   TempSrc:   Oral   SpO2: 96% 96% 100% 98%   There were no vitals filed for this visit. Exam: General exam: Appears comfortable  HEENT: oral mucosa moist- ptosis and squint of left eye Respiratory system: Clear to auscultation.  Cardiovascular system: S1 & S2 heard  Gastrointestinal system: Abdomen soft, non-tender, nondistended. Normal bowel sounds   Extremities: No cyanosis, clubbing or edema Psychiatry:  Mood & affect appropriate.      CBC: Recent Labs  Lab 12/23/24 1950  WBC 15.8*  NEUTROABS 13.2*  HGB 14.2  HCT 43.9  MCV 93.8  PLT 197   Basic Metabolic Panel: Recent Labs  Lab 12/23/24 1950 12/23/24 2333 12/24/24 0333  NA 138  --  140  K 3.1*  --  3.3*  CL 99  --  101  CO2 25  --  30  GLUCOSE 140*  --  112*  BUN 32*  --  33*  CREATININE 3.46*  --  3.42*  CALCIUM  9.6  --  10.2  MG  --  1.6*  --   PHOS  --  1.8*  --      Scheduled Meds:  levETIRAcetam   500 mg Intravenous Q12H   pantoprazole  (PROTONIX ) IV  40 mg Intravenous Q12H   potassium chloride   40 mEq Oral BID    Imaging and lab data personally reviewed   Author: Margot Oriordan  12/24/2024 7:57 AM  To contact [facility]>   Check the care team in [facility] and look for the attending/consulting [facility] provider listed  Log into [email or web address] and use [place]'s universal password   Go  to> [facility]  and find provider  If you still have difficulty reaching the provider, please page the [facility] (Director on Call) for the Hospitalists listed on amion     "

## 2024-12-24 NOTE — ED Notes (Addendum)
 Dr Earley made aware of patient's BP of 191/107, patient is asymptomatic at this time.

## 2024-12-24 NOTE — Assessment & Plan Note (Signed)
-  will monitor on tele avoid QT prolonging medications, rehydrate correct electrolytes ? ?

## 2024-12-24 NOTE — Assessment & Plan Note (Signed)
 History of seizure disorders continue Keppra  500 twice daily changed to IV as patient unclear if able to tolerate p.o. Obtain EEG, family is concerned the patient may be having some seizures as patient having intermittent episodes of poor responsiveness if EEG is abnormal will need neurology consult.  On my exam patient is able to follow commands although does appear to be somewhat fatigued family also feels like he is doing a bit better after initiation of antibiotics and fluids

## 2024-12-24 NOTE — Progress Notes (Signed)
 EEG complete - results pending

## 2024-12-24 NOTE — H&P (Signed)
 "    Tony Davis. FMW:[id number] DOB: 1961/01/06 DOA: 12/23/2024     PCP: [name], DO   Outpatient Specialists:   CARDS:   Dr. Lonni Cash, MD   No care team member to display Urology Dr. Cam  Patient arrived to ER on 12/23/24 at 1832 Referred by Attending Silvester Ales, MD   Patient coming from:    home Lives   With family     Chief Complaint: Confusion fever   HPI: Tony Davis. is a 64 y.o. male with medical history significant of hemorrhagic stroke basal ganglia hemorrhage in 2015 resulting in vascular dementia, seizure disorder on Keppra , CAD status post DES 2011, HTN, HLD, CKD 3b, hx of DVT status post IVC filter, neoplasm of left kidney,, BPH, history of kidney stones, diastolic CHF, hydrocephalus status post VP shunt in 2015    Presented with   confusion  Family states that at his baseline patient is able to recognize them walks with a walker to the car and able to advocate and request items Yesterday he started to have some evidence of confusion and decreased responsiveness they also felt  a little warm to them.  Patient was evaluated by pace but it was unclear what was the cause of his symptoms.  Today he went to urology where his nephrostomy tube was pulled.  After that the patient continued to become more confused family was concerned because he does have history of seizures And brought him to emergency department.  Per family he also had an episode of soft stools Although not diarrhea per se Patient with hx of right Ureteral stent by Cam on 12/17/2024 and it got pulled by Urology today Comes in to day with confusion has hx of Dmentia/CVA at baseline confused but oriented to self and place and family Today not eating, does not know his family and that is not normal for him  He has been started on Cipro  for Prophylaxis Fever 102.2 WBC 15.8 lactic Acid 2.6 CT showed pyelonephritis no hydro He only has one kidney Noted to have  AKI Cr up to 3.46 at baseline 1.6      Denies significant ETOH intake   Does not smoke     Regarding pertinent Chronic problems:    Hyperlipidemia -  on statins Lipitor  (atorvastatin )  Lipid Panel     Component Value Date/Time   CHOL 80 09/21/2023 0838   TRIG 83 09/21/2023 0838   HDL 34 (L) 09/21/2023 0838   CHOLHDL 2.4 09/21/2023 0838   VLDL 17 09/21/2023 0838   LDLCALC 29 09/21/2023 0838   LDLDIRECT 145.0 07/09/2016 1654     HTN on hydralazine  Imdur  lisinopril  Toprol    chronic CHF diastolic  - last echo  Recent Results (from the past 56199 hours)  ECHOCARDIOGRAM COMPLETE   Collection Time: 06/06/23 10:28 AM  Result Value   Area-P 1/2 2.91   S' Lateral 2.10   Est EF 60 - 65%   Narrative      ECHOCARDIOGRAM REPORT     IMPRESSIONS    1. No LVOT obstruction. Left ventricular ejection fraction, by estimation, is 60 to 65%. The left ventricle has normal function. The left ventricle has no regional wall motion abnormalities. There is moderate concentric left ventricular hypertrophy.  Left ventricular diastolic parameters are consistent with Grade I diastolic dysfunction (impaired relaxation).  2. Right ventricular systolic function is normal. The right ventricular size is normal.  3. The mitral valve is normal in  structure. No evidence of mitral valve regurgitation.  4. The aortic valve is grossly normal. Aortic valve regurgitation is not visualized.  5. The inferior vena cava is normal in size with greater than 50% respiratory variability, suggesting right atrial pressure of 3 mmHg.            CAD  - On  statin, betablocker,                  -  followed by cardiology                       OSA - noncompliant with CPAP    Hx of CVA -  with/  residual deficits       Hx of DVT status post IVC filter    CKD stage IIIb baseline Cr 1.6 Estimated Creatinine Clearance: 27 mL/min (A) (by C-G formula based on SCr of 3.46 mg/dL (H)).  Lab Results  Component Value Date    CREATININE 3.46 (H) 12/23/2024   CREATININE 1.59 (H) 12/09/2024   CREATININE 1.81 (H) 09/01/2024   Lab Results  Component Value Date   NA 138 12/23/2024   CL 99 12/23/2024   K 3.1 (L) 12/23/2024   CO2 25 12/23/2024   BUN 32 (H) 12/23/2024   CREATININE 3.46 (H) 12/23/2024   GFRNONAA 19 (L) 12/23/2024   CALCIUM  9.6 12/23/2024   PHOS 1.8 (L) 12/23/2024   ALBUMIN  4.0 12/23/2024   GLUCOSE 140 (H) 12/23/2024     BPH - on Flomax ,       Demen   Seizure DO -  currently on keppra       While in ER:     UA found to have evidence of UTI started on cefepime  ER provider notified urology that patient is being admitted since patient just saw them earlier today and had his nephrostomy tube pulled    Lab Orders         Blood Culture (routine x 2)         Resp panel by RT-PCR (RSV, Flu A&B, Covid) Anterior Nasal Swab         Urine Culture         Comprehensive metabolic panel         CBC with Differential         Protime-INR         Urinalysis, w/ Reflex to Culture (Infection Suspected) -Urine, Clean Catch         Blood gas, venous         Ammonia         CK         Magnesium          Phosphorus         TSH         Procalcitonin         Creatinine, urine, random         Osmolality, urine         Osmolality         Sodium, urine, random         Prealbumin         Lactic acid, plasma         Basic metabolic panel with GFR         I-Stat Lactic Acid, ED      CT HEAD ordered   MRI brain  ordered but given pt has a shunt will need to be  done in AM  CXR -  NON acute  CTabd/pelvis -  infectious cystitis with ascending urinary tract infection/pyelonephritis in the setting of multiple nonobstructing right renal calculi. No hydronephrosis. No perinephric fluid collection.    Following Medications were ordered in ER: Medications  potassium chloride  (KLOR-CON ) packet 40 mEq (40 mEq Oral Given 12/23/24 2324)  ceFEPIme  (MAXIPIME ) 2 g in sodium chloride  0.9 % 100 mL IVPB (has  no administration in time range)  potassium PHOSPHATE  15 mmol in dextrose  5 % 250 mL infusion (has no administration in time range)  magnesium  sulfate IVPB 2 g 50 mL (has no administration in time range)  acetaminophen  (TYLENOL ) tablet 1,000 mg (1,000 mg Oral Given 12/23/24 2005)  lactated ringers  bolus 1,000 mL (1,000 mLs Intravenous New Bag/Given 12/23/24 2103)  ceFEPIme  (MAXIPIME ) 2 g in sodium chloride  0.9 % 100 mL IVPB (2 g Intravenous New Bag/Given 12/23/24 2328)    _______________________________________________________ ER Provider Called:     Urology  Dr.Borden      ED Triage Vitals  Encounter Vitals Group     BP 12/23/24 1921 110/76     Girls Systolic BP Percentile --      Girls Diastolic BP Percentile --      Boys Systolic BP Percentile --      Boys Diastolic BP Percentile --      Pulse Rate 12/23/24 1921 93     Resp 12/23/24 1921 (!) 22     Temp 12/23/24 1928 (!) 102.2 F (39 C)     Temp Source 12/23/24 1928 Rectal     SpO2 12/23/24 1921 96 %     Weight --      Height --      Head Circumference --      Peak Flow --      Pain Score 12/23/24 2330 0     Pain Loc --      Pain Education --      Exclude from Growth Chart --   UFJK(75)@     _________________________________________ Significant initial  Findings: Abnormal Labs Reviewed  COMPREHENSIVE METABOLIC PANEL WITH GFR - Abnormal; Notable for the following components:      Result Value   Potassium 3.1 (*)    Glucose, Bld 140 (*)    BUN 32 (*)    Creatinine, Ser 3.46 (*)    Total Bilirubin 1.3 (*)    GFR, Estimated 19 (*)    All other components within normal limits  CBC WITH DIFFERENTIAL/PLATELET - Abnormal; Notable for the following components:   WBC 15.8 (*)    Neutro Abs 13.2 (*)    Monocytes Absolute 1.4 (*)    Abs Immature Granulocytes 0.08 (*)    All other components within normal limits  PROTIME-INR - Abnormal; Notable for the following components:   Prothrombin Time 15.5 (*)    All other  components within normal limits  URINALYSIS, W/ REFLEX TO CULTURE (INFECTION SUSPECTED) - Abnormal; Notable for the following components:   Color, Urine AMBER (*)    APPearance HAZY (*)    Hgb urine dipstick MODERATE (*)    Protein, ur >=300 (*)    Leukocytes,Ua LARGE (*)    Bacteria, UA FEW (*)    All other components within normal limits  MAGNESIUM  - Abnormal; Notable for the following components:   Magnesium  1.6 (*)    All other components within normal limits  PHOSPHORUS - Abnormal; Notable for the following components:   Phosphorus 1.8 (*)    All other components within  normal limits  I-STAT CG4 LACTIC ACID, ED - Abnormal; Notable for the following components:   Lactic Acid, Venous 2.6 (*)    All other components within normal limits       Cardiac Panel (last 3 results) Recent Labs    12/23/24 2333  CKTOTAL 128     ECG: Ordered Personally reviewed and interpreted by me showing: HR : 93 Rhythm: Sinus rhythm Borderline left axis deviation Abnormal R-wave progression, late transition Borderline T abnormalities, inferior leads Borderline prolonged QT interval Baseline wander in lead(s) V3 QTC 492      ____________________ This patient meets SIRS Criteria and may be septic.   The recent clinical data is shown below. Vitals:   12/23/24 1921 12/23/24 1928 12/23/24 1955 12/23/24 2221  BP: 110/76  114/78   Pulse: 93     Resp: (!) 22  (!) 25   Temp:  (!) 102.2 F (39 C)  99.7 F (37.6 C)  TempSrc:  Rectal  Rectal  SpO2: 96%       WBC     Component Value Date/Time   WBC 15.8 (H) 12/23/2024 1950   LYMPHSABS 1.0 12/23/2024 1950   MONOABS 1.4 (H) 12/23/2024 1950   EOSABS 0.1 12/23/2024 1950   BASOSABS 0.1 12/23/2024 1950     Lactic Acid, Venous    Component Value Date/Time   LATICACIDVEN 1.7 12/23/2024 2254     Procalcitonin   Ordered      UA  evidence of UTI    Urine analysis:    Component Value Date/Time   COLORURINE AMBER (A) 12/23/2024 2159    APPEARANCEUR HAZY (A) 12/23/2024 2159   LABSPEC 1.014 12/23/2024 2159   PHURINE 8.0 12/23/2024 2159   GLUCOSEU NEGATIVE 12/23/2024 2159   GLUCOSEU NEGATIVE 12/26/2015 1659   HGBUR MODERATE (A) 12/23/2024 2159   BILIRUBINUR NEGATIVE 12/23/2024 2159   KETONESUR NEGATIVE 12/23/2024 2159   PROTEINUR >=300 (A) 12/23/2024 2159   UROBILINOGEN 0.2 12/26/2015 1659   NITRITE NEGATIVE 12/23/2024 2159   LEUKOCYTESUR LARGE (A) 12/23/2024 2159    Results for orders placed or performed during the hospital encounter of 12/23/24  Resp panel by RT-PCR (RSV, Flu A&B, Covid) Anterior Nasal Swab     Status: None   Collection Time: 12/23/24  9:10 PM   Specimen: Anterior Nasal Swab  Result Value Ref Range Status   SARS Coronavirus 2 by RT PCR NEGATIVE NEGATIVE Final         Influenza A by PCR NEGATIVE NEGATIVE Final   Influenza B by PCR NEGATIVE NEGATIVE Final         Resp Syncytial Virus by PCR NEGATIVE NEGATIVE Final          ABX started Antibiotics Given (last 72 hours)     Date/Time Action Medication Dose Rate   12/23/24 2328 New Bag/Given   ceFEPIme  (MAXIPIME ) 2 g in sodium chloride  0.9 % 100 mL IVPB 2 g 200 mL/hr       _______  __________________________________________________________ Recent Labs  Lab 12/23/24 1950 12/23/24 2333  NA 138  --   K 3.1*  --   CO2 25  --   GLUCOSE 140*  --   BUN 32*  --   CREATININE 3.46*  --   CALCIUM  9.6  --   MG  --  1.6*  PHOS  --  1.8*    Cr   Up from baseline see below Lab Results  Component Value Date   CREATININE 3.46 (H) 12/23/2024  CREATININE 1.59 (H) 12/09/2024   CREATININE 1.81 (H) 09/01/2024    Recent Labs  Lab 12/23/24 1950  AST 35  ALT 19  ALKPHOS 121  BILITOT 1.3*  PROT 7.7  ALBUMIN  4.0   Lab Results  Component Value Date   CALCIUM  9.6 12/23/2024   PHOS 1.8 (L) 12/23/2024    Plt: Lab Results  Component Value Date   PLT 197 12/23/2024       Recent Labs  Lab 12/23/24 1950  WBC 15.8*  NEUTROABS 13.2*   HGB 14.2  HCT 43.9  MCV 93.8  PLT 197    HG/HCT  stable,      Component Value Date/Time   HGB 14.2 12/23/2024 1950   HCT 43.9 12/23/2024 1950   MCV 93.8 12/23/2024 1950     Hospitalist was called for admission for   Sepsis,  Hypokalemia   AKI (acute kidney injury)  Acute encephalopathy due to infection     The following Work up has been ordered so far:  Orders Placed This Encounter  Procedures   Critical Care   Blood Culture (routine x 2)   Resp panel by RT-PCR (RSV, Flu A&B, Covid) Anterior Nasal Swab   Urine Culture   DG Chest Port 1 View   CT Renal Stone Study   MR BRAIN WO CONTRAST   CT HEAD WO CONTRAST ( )   Comprehensive metabolic panel   CBC with Differential   Protime-INR   Urinalysis, w/ Reflex to Culture (Infection Suspected) -Urine, Clean Catch   Blood gas, venous   Ammonia   CK   Magnesium    Phosphorus   TSH   Procalcitonin   Creatinine, urine, random   Osmolality, urine   Osmolality   Sodium, urine, random   Prealbumin   Lactic acid, plasma   Basic metabolic panel with GFR   Diet NPO time specified   Check Rectal Temperature   Document height and weight   Assess and Document Glasgow Coma Scale   Document vital signs within 1-hour of fluid bolus completion.  Notify provider of abnormal vital signs despite fluid resuscitation.   Refer to Sidebar Report: Sepsis Bundle ED/IP   Notify provider for difficulties obtaining IV access   Initiate Carrier Fluid Protocol   Catherize if unable to void   Cardiac Monitoring - Continuous Indefinite   Full code   Consult to hospitalist   Consult to urology   Nutritional services consult   I-Stat Lactic Acid, ED   ED EKG   EKG 12-Lead   EEG adult   Insert peripheral IV X 1   Admit to Inpatient (patient's expected length of stay will be greater than 2 midnights or inpatient only procedure)     OTHER Significant initial  Findings:  labs showing:     DM  labs:  HbA1C: No results for input(s):  HGBA1C in the last 8760 hours.     CBG (last 3)  No results for input(s): GLUCAP in the last 72 hours.        Cultures:    Component Value Date/Time   SDES  12/17/2024 0615    URINE, CLEAN CATCH Performed at [facility], [address] [place], [place] [address]    SPECREQUEST  12/17/2024 0615    NONE Performed at [facility], [address], [place], [place] [address]    CULT (A) 12/17/2024 0615    <10,000 COLONIES/mL INSIGNIFICANT GROWTH Performed at [facility], [address],  [place], [place] [address]    REPTSTATUS 12/18/2024 FINAL 12/17/2024 0615     Radiological Exams on Admission: CT Renal Stone Study Result Date: 12/23/2024 EXAM: CT ABDOMEN AND PELVIS WITHOUT CONTRAST 12/23/2024 09:51:40 PM TECHNIQUE: CT of the abdomen and pelvis was performed without the administration of intravenous contrast. Multiplanar reformatted images are provided for review. Automated exposure control, iterative reconstruction, and/or weight-based adjustment of the mA/kV was utilized to reduce the radiation dose to as low as reasonably achievable. COMPARISON: 11/19/2024 CLINICAL HISTORY: Abdominal/flank pain, stone suspected. Weakness, anorexia, altered mental status. FINDINGS: LOWER CHEST: Ventricular peritoneal shunt catheter tube can be seen with its tip within the right anterior subdiaphragmatic region. Stable small pericardial effusion. LIVER: The liver is unremarkable. GALLBLADDER AND BILE DUCTS: Gallbladder is unremarkable. No biliary ductal dilatation. SPLEEN: No acute abnormality. PANCREAS: No acute abnormality. ADRENAL GLANDS: No acute abnormality. KIDNEYS, URETERS AND BLADDER: Right kidney: 8.6 cm simple cortical cyst within the interpolar region and 7.2 cm simple cortical cyst within the lower pole for which no follow up imaging is recommended. Multiple nonobstructing calculi are seen within the right kidney measuring up to 6 mm. No  hydronephrosis. There is mild right perinephric and moderate periureteric inflammatory stranding. No perinephric fluid collection. Left kidney: Status post left nephrectomy. Bladder: Largely decompressed. There is, however, circumferential bladder wall thickening and extensive perivesicular inflammatory stranding suggesting a superimposed infectious or inflammatory cystitis. Together, the findings are most suggestive of infectious cystitis and ascending urinary tract infection / pyelonephritis. GI AND BOWEL: Stomach demonstrates no acute abnormality. There is no bowel obstruction. Tiny fat-containing umbilical hernia. PERITONEUM AND RETROPERITONEUM: No ascites. No free air. VASCULATURE: Inferior vena cava filter is in place. Relatively diminutive caliber of the iliocaval venous vasculature and extensive body wall venous collaterals suggest chronic caval thrombosis, though this is not well assessed on this noncontrast examination. Mild aortoiliac atherosclerotic calcification. No aortic aneurysm. LYMPH NODES: No lymphadenopathy. REPRODUCTIVE ORGANS: No acute abnormality. BONES AND SOFT TISSUES: No acute osseous abnormality. No focal soft tissue abnormality. IMPRESSION: 1. Findings most suggestive of infectious cystitis with ascending urinary tract infection/pyelonephritis in the setting of multiple nonobstructing right renal calculi. No hydronephrosis. No perinephric fluid collection. 2. Status post left nephrectomy. 3. Ventriculoperitoneal shunt catheter with tip in the right anterior subdiaphragmatic region. 4. Stable small pericardial effusion. 5. Inferior vena cava filter in place with extensive body wall venous collaterals suggesting chronic caval thrombosis. 6. RAF score includes aortic atherosclerosis (ICD10-I70.0). Electronically signed by: Dorethia Molt MD 12/23/2024 10:01 PM EST RP Workstation: [id number]   DG Chest Port 1 View Result Date: 12/23/2024 EXAM: 1 VIEW(S) XRAY OF THE CHEST 12/23/2024 07:55:00  PM COMPARISON: 09/01/2024 CLINICAL HISTORY: Questionable sepsis - evaluate for abnormality FINDINGS: LINES, TUBES AND DEVICES: Right VP shunt tubing partially visible. LUNGS AND PLEURA: Low lung volumes. No focal pulmonary opacity. No pleural effusion. No pneumothorax. HEART AND MEDIASTINUM: No acute abnormality of the cardiac and mediastinal silhouettes. BONES AND SOFT TISSUES: No acute osseous abnormality. IMPRESSION: 1. No acute findings. Electronically signed by: Oneil Devonshire MD 12/23/2024 07:58 PM EST RP Workstation: [id number]   _______________________________________________________________________________________________________ Latest  Blood pressure 114/78, pulse 93, temperature 99.7 F (37.6 C), temperature source Rectal, resp. rate (!) 25, SpO2 96%.   Vitals  labs and radiology finding personally reviewed  Review of Systems:    Pertinent positives include:   Fevers, chills, fatigue confusion  Constitutional:  No weight loss, night sweats,, weight loss  HEENT:  No headaches, Difficulty swallowing,Tooth/dental problems,Sore throat,  No sneezing,  itching, ear ache, nasal congestion, post nasal drip,  Cardio-vascular:  No chest pain, Orthopnea, PND, anasarca, dizziness, palpitations.no Bilateral lower extremity swelling  GI:  No heartburn, indigestion, abdominal pain, nausea, vomiting, diarrhea, change in bowel habits, loss of appetite, melena, blood in stool, hematemesis Resp:  no shortness of breath at rest. No dyspnea on exertion, No excess mucus, no productive cough, No non-productive cough, No coughing up of blood.No change in color of mucus.No wheezing. Skin:  no rash or lesions. No jaundice GU:  no dysuria, change in color of urine, no urgency or frequency. No straining to urinate.  No flank pain.  Musculoskeletal:  No joint pain or no joint swelling. No decreased range of motion. No back pain.  Psych:  No change in mood or affect. No depression or anxiety. No memory loss.   Neuro: no localizing neurological complaints, no tingling, no weakness, no double vision, no gait abnormality, no slurred speech, no   All systems reviewed and apart from HOPI all are negative _______________________________________________________________________________________________ Past Medical History:   Past Medical History:  Diagnosis Date   Acute respiratory failure (HCC) 2015   a. Following BG hemorrhage - s/p trache with decannulation.   Basal ganglia hemorrhage (HCC) 2015   a. 2015 - with intraventricular extension and hydrocephalus requiring right crani and VP shunt, complicated by encephalopathy, protein calorie malnutrition, acute resp failure req trache and PEG, BLE DVT s/p IVC, enterobacter PNA, subsequent right hemiparesis and difficulty communicating.   BPH (benign prostatic hyperplasia)    CAD (coronary artery disease)    a. s/p promus DES circumflex artery 10/13/10. b. Cath 04/2013: stable disease but possible small vessel disease -Imdur  added.   Carotid artery disease    a. 1-29% BICA by study 04/2014.   CVA (cerebral vascular accident) (HCC) 02/2010   a. CVA 2011, prior reported TIAs. b. CNS hemorrhage 2015.   CVA (cerebral vascular accident) (HCC) 06/2014   hemorrhagic; in [place] ; brain activity diminshed; ability to do anything; can't do ADLs on own   DVT (deep venous thrombosis) (HCC) 2013   a. 2013. b. 2015 - reported BLE DVT s/p IVC filter.    Enterobacter cloacae pneumonia (HCC) 2015   Falls    Folate deficiency anemia    GERD (gastroesophageal reflux disease)    Hematuria 07/2011   History of kidney stones    HLD (hyperlipidemia)    HTN (hypertension)    Hydrocephalus (HCC) 2015   a. 2/2 BG hemorrhage.   Incontinence of feces    Lower extremity edema    LV dysfunction    a. 2011 - EF 45-50%, subsequent 55-60% in 04/2014.   Noncompliance    Orthostatic hypotension    OSA on CPAP    not using CPAP right now (11/30/2014)   PFO (patent foramen  ovale)    a. echo 8/12: EF 55-65%, grade 2 diast dysfnx; +PFO on bubble study. b. TEE 4/13: EF normal, atrial septum with suspicion for interatrial septum fenestrations without flow across and few large bubbles noted in LA.  This was not felt to require closure    Protein calorie malnutrition    Rheumatoid arthritis(714.0)    S/P percutaneous endoscopic gastrostomy (PEG) tube placement (HCC) 2015   TIA (transient ischemic attack)    1-2; both after the one in 02/2010 (09/25/2013)   Urinary incontinence       Past Surgical History:  Procedure Laterality Date   CORONARY ANGIOPLASTY WITH STENT PLACEMENT  12/10/2010   1 (09/24/2013)  CYSTOSCOPY/URETEROSCOPY/HOLMIUM LASER/STENT PLACEMENT Right 07/18/2023   Procedure: CYSTOSCOPY RIGHT URETEROSCOPY, HOLMIUM LASER LITHOTRIPSY, AND RIGHT URETERAL STENT PLACEMENT;  Surgeon: Cam Morene ORN, MD;  Location: [facility];  Service: Urology;  Laterality: Right;  90 MINUTES   CYSTOSCOPY/URETEROSCOPY/HOLMIUM LASER/STENT PLACEMENT Right 12/17/2024   Procedure: CYSTOSCOPY/URETEROSCOPY/HOLMIUM LASER/STENT PLACEMENT;  Surgeon: Cam Morene ORN, MD;  Location: [facility];  Service: Urology;  Laterality: Right;  CYSTOSCOPY/RIGHT URETEROSCOPY/HOLMIUM LASER/STENT PLACEMENT   filter in place for clots      per wife at preop on 01/11/23   KNEE ARTHROSCOPY Bilateral 08/11/1999   2 on left and 3 on rt   LAPAROSCOPIC NEPHRECTOMY Left 01/18/2023   Procedure: LAPAROSCOPIC RADICAL LEFT NEPHRECTOMY;  Surgeon: Cam Morene ORN, MD;  Location: [facility];  Service: Urology;  Laterality: Left;  210 MINUTES NEDDED FOR CASE   LEFT HEART CATHETERIZATION WITH CORONARY ANGIOGRAM N/A 04/24/2013   Procedure: LEFT HEART CATHETERIZATION WITH CORONARY ANGIOGRAM;  Surgeon: Lonni JONETTA Cash, MD;  Location[facility] C[facility] LAB;  Service: Cardiovascular;  Laterality: N/A;   shunt in head      TEE WITHOUT CARDIOVERSION  03/21/2012   Procedure: TRANSESOPHAGEAL ECHOCARDIOGRAM (TEE);  Surgeon:  Vina LULLA Gull, MD;  Location: [facility] ENDOSCOPY;  Service: Cardiovascular;  Laterality: N/A;    Social History:  Ambulatory  walker      reports that he has been smoking cigarettes. He started smoking about 47 years ago. He has a 35 pack-year smoking history. He has never used smokeless tobacco. He reports that he does not drink alcohol and does not use drugs.   Family History:   Family History  Problem Relation Age of Onset   Heart attack Father    Stroke Father    Heart attack Sister    Hypertension Brother    Seizures Sister    Stroke Sister    Cancer Neg Hx    Kidney disease Neg Hx    Diabetes Neg Hx    ______________________________________________________________________________________________ Allergies: Allergies[1]   Prior to Admission medications  Medication Sig Start Date End Date Taking? Authorizing Provider  acetaminophen  (TYLENOL ) 325 MG tablet Take 650 mg by mouth every 6 (six) hours as needed for moderate pain.   Yes [provider]  atorvastatin  (LIPITOR ) 80 MG tablet TAKE ONE TABLET BY MOUTH DAILY Patient taking differently: Take 80 mg by mouth daily. 08/16/18  Yes Joshua Debby CROME, MD  Cholecalciferol (VITAMIN D3) 25 MCG (1000 UT) CAPS Take 1,000 Units by mouth daily.   Yes [provider]  folic acid  (FOLVITE ) 1 MG tablet Take 1 tablet (1 mg total) by mouth daily. 07/16/24  Yes Danford, Lonni SQUIBB, MD  hydrALAZINE  (APRESOLINE ) 100 MG tablet Take 100 mg by mouth 2 (two) times daily.   Yes [provider]  levETIRAcetam  (KEPPRA ) 500 MG tablet Take 1 tablet (500 mg total) by mouth 2 (two) times daily. 07/15/24  Yes Danford, Lonni SQUIBB, MD  lisinopril  (ZESTRIL ) 40 MG tablet Take 1 tablet (40 mg total) by mouth daily. 07/16/24  Yes Danford, Lonni SQUIBB, MD  melatonin 5 MG TABS Take 5 mg by mouth at bedtime.   Yes [provider]  memantine  (NAMENDA ) 10 MG tablet Take 10 mg by mouth 2 (two) times daily. 10/30/21  Yes [provider]  metoprolol  succinate (TOPROL -XL) 100 MG 24 hr tablet Take 1 tablet (100 mg total) by mouth 2 (two) times daily. Take with or immediately following a meal. 12/25/21  Yes Cash Lonni JONETTA, MD  pantoprazole  (PROTONIX ) 40 MG  tablet Take 1 tablet (40 mg total) by mouth daily. Patient taking differently: Take 40 mg by mouth 2 (two) times daily before a meal. 09/27/23  Yes Ghimire, Donalda HERO, MD  polyethylene glycol powder (GLYCOLAX/MIRALAX) 17 GM/SCOOP powder Take 17 g by mouth 2 (two) times daily as needed for mild constipation or moderate constipation (Dissolve in 4-8 ounces of liquid). Dissolve 1 capful (17g) in 4-8 ounces of liquid   Yes [provider]  rivastigmine  (EXELON ) 6 MG capsule Take 6 mg by mouth 2 (two) times daily. 11/11/21  Yes [provider]  sodium fluoride (DENTA 5000 PLUS) 1.1 % CREA dental cream Place 1 Application onto teeth every evening.   Yes [provider]  tamsulosin  (FLOMAX ) 0.4 MG CAPS capsule Take 1 capsule (0.4 mg total) by mouth daily. 12/17/24  Yes Cam Morene ORN, MD  isosorbide  mononitrate (IMDUR ) 30 MG 24 hr tablet Take 0.5 tablets (15 mg total) by mouth daily. Patient not taking: Reported on 12/23/2024 07/16/24   Jonel Lonni SQUIBB, MD  traMADol  (ULTRAM ) 50 MG tablet Take 1-2 tablets (50-100 mg total) by mouth every 6 (six) hours as needed for moderate pain (pain score 4-6). Patient not taking: Reported on 12/23/2024 12/17/24   Cam Morene ORN, MD    ___________________________________________________________________________________________________ Physical Exam:    12/23/2024    7:55 PM 12/23/2024    7:21 PM 12/17/2024   10:49 AM  Vitals with BMI  Systolic 114 110 866  Diastolic 78 76 93  Pulse  93      1. General:  in No  Acute distress    Chronically ill   -appearing 2. Psychological: Somnolent not oriented 3. Head/ENT:   Dry Mucous Membranes                          Head Non traumatic, neck  supple                           Poor Dentition 4. SKIN: decreased Skin turgor,  Skin clean Dry and intact no rash    5. Heart: Regular rate and rhythm no  Murmur, no Rub or gallop 6. Lungs:  , no wheezes or crackles   7. Abdomen: Soft,  non-tender, Non distended bowel sounds present 8. Lower extremities: no clubbing, cyanosis, no  edema 9. Neurologically Grossly intact, moving all 4 extremities equally able to squeeze bilateral upper extremities 10. MSK: Normal range of motion    Chart has been reviewed  ______________________________________________________________________________________________  Assessment/Plan  64 y.o. male with medical history significant of hemorrhagic stroke basal ganglia hemorrhage in 2015 resulting in vascular dementia, seizure disorder on Keppra , CAD status post DES 2011, HTN, HLD, CKD 3b, hx of DVT status post IVC filter, neoplasm of left kidney,, BPH, history of kidney stones, diastolic CHF, hydrocephalus status post VP shunt in 2015   Admitted for   Sepsis, due to unspecified organism, unspecified whether acute organ dysfunction present (HCC)    Hypokalemia    AKI (acute kidney injury)    Acute encephalopathy due to infection     Present on Admission:  Sepsis (HCC)  Altered mental status  Chronic kidney disease, stage 3a (HCC)  Diastolic dysfunction with chronic heart failure (HCC)  Essential hypertension  Hyperlipidemia with target LDL less than 100  Hypokalemia  CAD, NATIVE VESSEL  Vascular dementia (HCC)  Hypomagnesemia  Hypophosphatemia  AKI (acute kidney injury)  UTI (urinary tract  infection)  QT prolongation   Altered mental status Family is concerned that patient may be having underlying seizures he has history of seizures that are not tonic-clonic but more absence like seizures per family. Continue Keppra  but changed to IV Order EEG Other explanation for altered mental status could be   - most likely multifactorial secondary to  combination of  infection and dehydration  - Will rehydrate   - treat underlining infection   - Hold contributing medications   - if no improvement may need further imaging to evaluate for CNS pathology pathology such as MRI of the brain   - neurological exam appears to be nonfocal but patient unable to cooperate fully   - VBG   Ammonia ordered   Chronic kidney disease, stage 3a (HCC)  -chronic avoid nephrotoxic medications such as NSAIDs, Vanco Zosyn combo,  avoid hypotension, continue to follow renal function   Diastolic dysfunction with chronic heart failure (HCC) Avoid over aggressive fluid overload  Essential hypertension Allow permissive HTN   Hyperlipidemia with target LDL less than 100 Continue Lipitor  80 mg   Hypokalemia - will replace electrolytes and repeat  check Mg, phos and Ca level and replace as needed Monitor on telemetry   Lab Results  Component Value Date   K 3.1 (L) 12/23/2024     Lab Results  Component Value Date   CREATININE 3.46 (H) 12/23/2024   Lab Results  Component Value Date   MG 1.9 07/12/2024   Lab Results  Component Value Date   CALCIUM  9.6 12/23/2024   PHOS 2.7 09/22/2023     Sepsis (HCC)  -SIRS criteria met with  elevated white blood cell count,       Component Value Date/Time   WBC 15.8 (H) 12/23/2024 1950   LYMPHSABS 1.0 12/23/2024 1950     tachycardia   ,   fever   RR >20 Today's Vitals   12/23/24 1921 12/23/24 1928 12/23/24 1955 12/23/24 2221  BP: 110/76  114/78   Pulse: 93     Resp: (!) 22  (!) 25   Temp:  (!) 102.2 F (39 C)  99.7 F (37.6 C)  TempSrc:  Rectal  Rectal  SpO2: 96%        -Most likely source being:  urinary   Acute Kidney Injury with Cr > 2,  Lab Results  Component Value Date   CREATININE 3.46 (H) 12/23/2024   CREATININE 1.59 (H) 12/09/2024    elevated lactic acid >2     Component Value Date/Time   LATICACIDVEN 2.6 (HH) 12/23/2024 1952    acute metabolic encephalopathy   - Obtain serial  lactic acid and procalcitonin level.  - Initiated IV antibiotics in ER:   Will continue  on : cefepime    - await results of blood and urine culture  - Rehydrate aggressively  Intravenous fluids were administered        30cc/kg fluid  11:09 PM   CAD, NATIVE VESSEL Continue Lipitor  80 mg po   Vascular dementia (HCC) Chronic expect some sundowning  Hypomagnesemia Will replace  Hypophosphatemia Will replace  Seizure (HCC) History of seizure disorders continue Keppra  500 twice daily changed to IV as patient unclear if able to tolerate p.o. Obtain EEG, family is concerned the patient may be having some seizures as patient having intermittent episodes of poor responsiveness if EEG is abnormal will need neurology consult.  On my exam patient is able to follow commands although does appear to be somewhat fatigued family  also feels like he is doing a bit better after initiation of antibiotics and fluids  AKI (acute kidney injury) Obtain electrolytes rehydrate follow renal function  UTI (urinary tract infection) Continue rocephin   Obtain urine culture ER provider discussed case with urology Dr. Renda who is aware of the patient Patient had recent nephrostomy tube removal CT scan at this point showing no hydro  QT prolongation - will monitor on tele avoid QT prolonging medications, rehydrate correct electrolytes    Other plan as per orders.  DVT prophylaxis:  SCD      Code Status:    Code Status: Full Code FULL CODE  as per wife I had personally discussed CODE STATUS with   family  ACP   none    Family Communication:   Family   at  Bedside  plan of care was discussed   with   Wife,    Diet  Diet Orders (From admission, onward)     Start     Ordered   12/23/24 1925  Diet NPO time specified  (Undifferentiated presentation (screening labs and basic nursing orders))  Diet effective now        12/23/24 1925            Disposition Plan:        To home once workup is  complete and patient is stable   Following barriers for discharge:                            Acute encephalopathy workup is complete                            Electrolytes corrected                                                              Afebrile, white count improving able to transition to PO antibiotics                                     Consult Orders  (From admission, onward)           Start     Ordered   12/25/24 0135  Nutritional services consult  Once       Provider:  (Not yet assigned)  Question:  Reason for Consult?  Answer:  assess nutrtional status   12/24/24 0136   12/23/24 2213  Consult to hospitalist  Once       Provider:  (Not yet assigned)  Question Answer Comment  Place call to: [facility]   Reason for Consult Admit      12/23/24 2212                               Would benefit from PT/OT eval prior to DC  Ordered                   Swallow eval - SLP ordered  Consults called: Urology  Treatment Team:  Renda Glance, MD  Admission status:  ED Disposition     ED Disposition  Admit   Condition  --   Comment  Hospital Area: [facility] [[id number]]  Level of Care: Progressive [102]  Admit to Progressive based on following criteria: MULTISYSTEM THREATS such as stable sepsis, metabolic/electrolyte imbalance with or without encephalopathy that is responding to early treatment.  May admit patient to [facility] or [facility] if equivalent level of care is available:: No  Diagnosis: Sepsis [facility]) [[id number]]  Admitting Physician: Fayetta Sorenson [[id number]]  Attending Physician: Lyn Joens [[id number]]  Certification:: I certify this patient will need inpatient services for at least 2 midnights  Expected Medical Readiness: 12/25/2024             inpatient     I Expect 2 midnight stay secondary to severity of patient's current illness need for inpatient interventions justified  by the following:  hemodynamic instability despite optimal treatment (tachycardia  )   Severe lab/radiological/exam abnormalities including:    Sepsis,  Hypokalemia,  AKI Acute encephalopathy due to infection    and extensive comorbidities including:    CHF   CAD     CKD  dementia  history of stroke with residual deficits    That are currently affecting medical management.   I expect  patient to be hospitalized for 2 midnights requiring inpatient medical care.  Patient is at high risk for adverse outcome (such as loss of life or disability) if not treated.  Indication for inpatient stay as follows:  Severe change from baseline regarding mental status Hemodynamic instability despite maximal medical therapy,    Need for IV antibiotics, IV fluids,,  Frequent labs    Level of care        progressive     Anysia Choi 12/24/2024, 2:09 AM    [facility]     after 2 AM please page floor coverage   If 7AM-7PM, please contact the day team taking care of the patient using [email or web address]        [1]  Allergies Allergen Reactions   Iodine Anaphylaxis   Norvasc  [Amlodipine ] Swelling and Other (See Comments)    Foot edema   Aricept [Donepezil] Other (See Comments)    Hallucinations   "

## 2024-12-24 NOTE — Procedures (Signed)
 Patient Name: Julus Kelley.  MRN: [id number]  Epilepsy Attending: Arlin MALVA Krebs  Referring Physician/Provider: Doutova, Anastassia, MD  Date: 12/24/2024 Duration: 23.05 mins  Patient history: 64yo M with ams. EEG to evaluate for seizure  Level of alertness: Awake  AEDs during EEG study: LEV  Technical aspects: This EEG study was done with scalp electrodes positioned according to the 10-20 International system of electrode placement. Electrical activity was reviewed with band pass filter of 1-70Hz , sensitivity of 7 uV/mm, display speed of 27mm/sec with a 60Hz  notched filter applied as appropriate. EEG data were recorded continuously and digitally stored.  Video monitoring was available and reviewed as appropriate.  Description: The posterior dominant rhythm consists of 7.5Hz  activity of moderate voltage (25-35 uV) seen predominantly in posterior head regions, symmetric and reactive to eye opening and eye closing. Hyperventilation and photic stimulation were not performed.     IMPRESSION: This study is within normal limits. No seizures or epileptiform discharges were seen throughout the recording.  A normal interictal EEG does not exclude the diagnosis of epilepsy.   Adelayde Minney O Daeron Carreno

## 2024-12-25 DIAGNOSIS — A419 Sepsis, unspecified organism: Secondary | ICD-10-CM | POA: Diagnosis not present

## 2024-12-25 DIAGNOSIS — N179 Acute kidney failure, unspecified: Secondary | ICD-10-CM | POA: Diagnosis not present

## 2024-12-25 DIAGNOSIS — R6520 Severe sepsis without septic shock: Secondary | ICD-10-CM | POA: Diagnosis not present

## 2024-12-25 LAB — CBC
HCT: 34.5 % — ABNORMAL LOW (ref 39.0–52.0)
HCT: 37.3 % — ABNORMAL LOW (ref 39.0–52.0)
Hemoglobin: 11.6 g/dL — ABNORMAL LOW (ref 13.0–17.0)
Hemoglobin: 12.7 g/dL — ABNORMAL LOW (ref 13.0–17.0)
MCH: 30.4 pg (ref 26.0–34.0)
MCH: 31.2 pg (ref 26.0–34.0)
MCHC: 33.6 g/dL (ref 30.0–36.0)
MCHC: 34 g/dL (ref 30.0–36.0)
MCV: 90.6 fL (ref 80.0–100.0)
MCV: 91.6 fL (ref 80.0–100.0)
Platelets: 156 K/uL (ref 150–400)
Platelets: 161 K/uL (ref 150–400)
RBC: 3.81 MIL/uL — ABNORMAL LOW (ref 4.22–5.81)
RBC: 4.07 MIL/uL — ABNORMAL LOW (ref 4.22–5.81)
RDW: 13.1 % (ref 11.5–15.5)
RDW: 13.1 % (ref 11.5–15.5)
WBC: 7.9 K/uL (ref 4.0–10.5)
WBC: 8.6 K/uL (ref 4.0–10.5)
nRBC: 0 % (ref 0.0–0.2)
nRBC: 0 % (ref 0.0–0.2)

## 2024-12-25 LAB — PHOSPHORUS
Phosphorus: 2.7 mg/dL (ref 2.5–4.6)
Phosphorus: 2.5 mg/dL (ref 2.5–4.6)

## 2024-12-25 LAB — COMPREHENSIVE METABOLIC PANEL WITH GFR
ALT: 19 U/L (ref 0–44)
AST: 29 U/L (ref 15–41)
Albumin: 3.3 g/dL — ABNORMAL LOW (ref 3.5–5.0)
Alkaline Phosphatase: 92 U/L (ref 38–126)
Anion gap: 11 (ref 5–15)
BUN: 29 mg/dL — ABNORMAL HIGH (ref 8–23)
CO2: 26 mmol/L (ref 22–32)
Calcium: 8.8 mg/dL — ABNORMAL LOW (ref 8.9–10.3)
Chloride: 100 mmol/L (ref 98–111)
Creatinine, Ser: 2.65 mg/dL — ABNORMAL HIGH (ref 0.61–1.24)
GFR, Estimated: 27 mL/min — ABNORMAL LOW
Glucose, Bld: 120 mg/dL — ABNORMAL HIGH (ref 70–99)
Potassium: 3.6 mmol/L (ref 3.5–5.1)
Sodium: 137 mmol/L (ref 135–145)
Total Bilirubin: 0.5 mg/dL (ref 0.0–1.2)
Total Protein: 6.5 g/dL (ref 6.5–8.1)

## 2024-12-25 LAB — BASIC METABOLIC PANEL WITH GFR
Anion gap: 9 (ref 5–15)
BUN: 27 mg/dL — ABNORMAL HIGH (ref 8–23)
CO2: 26 mmol/L (ref 22–32)
Calcium: 8.6 mg/dL — ABNORMAL LOW (ref 8.9–10.3)
Chloride: 101 mmol/L (ref 98–111)
Creatinine, Ser: 2.41 mg/dL — ABNORMAL HIGH (ref 0.61–1.24)
GFR, Estimated: 30 mL/min — ABNORMAL LOW
Glucose, Bld: 125 mg/dL — ABNORMAL HIGH (ref 70–99)
Potassium: 3.6 mmol/L (ref 3.5–5.1)
Sodium: 137 mmol/L (ref 135–145)

## 2024-12-25 LAB — HIV ANTIBODY (ROUTINE TESTING W REFLEX): HIV Screen 4th Generation wRfx: NONREACTIVE

## 2024-12-25 LAB — MAGNESIUM: Magnesium: 1.8 mg/dL (ref 1.7–2.4)

## 2024-12-25 NOTE — Progress Notes (Signed)
 Patient up to chair with 1 assistance and walker

## 2024-12-25 NOTE — Progress Notes (Signed)
 " [facility] Progress Note  Patient: Tony Davis.     FMW:[id number]  DOA: 12/23/2024   PCP: [name], DO       Brief hospital course: 64 year old male with significant past medical history including hemorrhagic basal ganglia stroke (2015) with residual vascular dementia, seizure disorder, CAD s/p DES (2011), CKD stage IIIb, left nephrectomy, right ureteral stent placed 12/17/24 and removed earlier today, diastolic CHF, and VP shunt who presents with acute worsening confusion.  Per family, at baseline patient recognizes family, ambulates with walker, and can express needs. Over the last 24 hours, he developed progressive confusion, decreased responsiveness, poor oral intake, and felt warm to touch. He was evaluated by [facility] without clear etiology. Earlier today, he was seen by urology and had his right ureteral stent removed, after which confusion worsened. Family reports a history of non-tonic-clonic (absence-like) seizures and were concerned about seizure activity.  In the ED, patient was febrile to 102.52F, tachypneic, leukocytic, and had AKI (Cr 3.46 from baseline ~1.6) with lactic acidosis (2.6). CT abdomen/pelvis showed infectious cystitis with ascending UTI/pyelonephritis in the setting of non-obstructing right renal calculi. He has only one kidney (s/p left nephrectomy). UA consistent with UTI.  Subjective:   States he feels good and would like to go home but not sure why he is in the hospital.  Assessment and Plan:  Principal Problem:  Sepsis related to Pyelonephritis - Urine growing greater than 100,000 colonies of Proteus mirabilis - cont CTX and f/u on cultures  Active Problems: Acute metabolic encephalopathy with h/o vascular dementia - due to above - poorly responsive initially but now very alert and told me that he is in the hospital for a stroke - he does have dementia - takes Namenda  and Exelon  at baseline      Seizure disorder - cont Keppra   AKI  with CKD stage 3a, dehydrated Left sided nephrectomy - Cr 3.46 when admitted- baseline is about 1.5 - sOsm elevated at 301 - ACE I on hold - IVF being given-creatinine improving and down to 2.41 today  Hypokalemia, hypomagnesemia, hypophosphatemia - K 3.3 - Phos 1.6 - Mg 1.6 - replacing  HTN - cont Hydralazine  and Toprol  - Lisinopril  on hold due to AKI - Added on as needed clonidine  as BP was high    QT prolongation - QT 492- may improve once electrolytes replaced     Code Status: Full Code Total time on patient care: 35 min DVT prophylaxis:  SCDs Start: 12/24/24 1845 Place and maintain sequential compression device Start: 12/24/24 1450SCDs     Objective:   Vitals:   12/24/24 2127 12/24/24 2128 12/24/24 2227 12/25/24 0447  BP: (!) 168/97 (!) 168/97 (!) 142/88 (!) 153/94  Pulse:  80 80 82  Resp:   20 20  Temp:   98.2 F (36.8 C) 98.8 F (37.1 C)  TempSrc:   Oral Oral  SpO2:   100% 93%  Weight:      Height:       Filed Weights   12/24/24 1013  Weight: 99.3 kg   Exam: General exam: Appears comfortable  HEENT: oral mucosa moist- ptosis and squint of left eye Respiratory system: Clear to auscultation.  Cardiovascular system: S1 & S2 heard  Gastrointestinal system: Abdomen soft, non-tender, nondistended. Normal bowel sounds   Extremities: No cyanosis, clubbing or edema Psychiatry:  Mood & affect appropriate.      CBC: Recent Labs  Lab 12/23/24 1950 12/24/24 9074 12/25/24 0042 12/25/24 0415  WBC 15.8* 13.7* 8.6 7.9  NEUTROABS 13.2*  --   --   --   HGB 14.2 12.7* 12.7* 11.6*  HCT 43.9 39.7 37.3* 34.5*  MCV 93.8 94.3 91.6 90.6  PLT 197 164 156 161   Basic Metabolic Panel: Recent Labs  Lab 12/23/24 1950 12/23/24 2333 12/24/24 0333 12/25/24 0042 12/25/24 0415  NA 138  --  140 137 137  K 3.1*  --  3.3* 3.6 3.6  CL 99  --  101 100 101  CO2 25  --  30 26 26   GLUCOSE 140*  --  112* 120* 125*  BUN 32*  --  33* 29* 27*  CREATININE 3.46*  --  3.42*  2.65* 2.41*  CALCIUM  9.6  --  10.2 8.8* 8.6*  MG  --  1.6*  --   --  1.8  PHOS  --  1.8*  --  2.7 2.5     Scheduled Meds:  atorvastatin   80 mg Oral Daily   hydrALAZINE   100 mg Oral BID   levETIRAcetam   500 mg Oral BID   metoprolol  succinate  100 mg Oral BID   pantoprazole   40 mg Oral BID AC   potassium chloride   40 mEq Oral BID   rivastigmine   6 mg Oral BID   tamsulosin   0.4 mg Oral Daily    Imaging and lab data personally reviewed   Author: Bradie Sangiovanni  12/25/2024 1:22 PM  To contact [facility]>   Check the care team in [facility] and look for the attending/consulting [facility] provider listed  Log into [email or web address] and use [facility] universal password   Go to> [facility]  and find provider  If you still have difficulty reaching the provider, please page the [facility] (Director on Call) for the Hospitalists listed on amion     "

## 2024-12-25 NOTE — Progress Notes (Signed)
 Initial Nutrition Assessment  DOCUMENTATION CODES:   Obesity unspecified  INTERVENTION:  - Carb Modified diet per MD.   - Automatic house trays to ensure the patient has 3 meals a day given dementia. - Monitor weight trends.  NUTRITION DIAGNOSIS:   Increased nutrient needs related to acute illness as evidenced by estimated needs.  GOAL:   Patient will meet greater than or equal to 90% of their needs  MONITOR:   PO intake, Weight trends  REASON FOR ASSESSMENT:   Consult Assessment of nutrition requirement/status  ASSESSMENT:   64 y.o. male with PMH significant of hemorrhagic stroke basal ganglia hemorrhage in 2015 resulting in vascular dementia, seizure disorder on Keppra , CAD, HTN, HLD, CKD 3b, neoplasm of left kidney, BPH, history of kidney stones, diastolic CHF, hydrocephalus d/p VP shunt in 2015 presented with confusion. Admitted for sepsis and metabolic encephalopathy.   Patient in bedside chair at time of visit. He is noted to have a history of dementia and be admitted for encephalopathy but more oriented today and answered all questions appropriately.   He is unsure of a UBW but reports he has lost weight recently. Unsure of a time frame. Per EMR, patient had a possible 20# or 8.5% weight loss from September to December. However, weight has been stable since then.   Patient reports eating 3-4 meals a day PTA and endorses normal appetite. States his appetite is somewhat decreased currently. However, discussed with RN who reports patient eats everything set in front of him. Had a burger, chips, cake, and ice cream for lunch.  Encouraged patient to eat 3 meals a day as tolerated. Placing patient on automatic trays given dementia. Monitor intake for need to add ONS.   Medications reviewed and include: -  Labs reviewed:  Creatinine 2.41  NUTRITION - FOCUSED PHYSICAL EXAM:  Flowsheet Row Most Recent Value  Orbital Region Moderate depletion  Upper Arm Region No  depletion  Thoracic and Lumbar Region No depletion  Buccal Region No depletion  Temple Region Mild depletion  Clavicle Bone Region Mild depletion  Clavicle and Acromion Bone Region No depletion  Scapular Bone Region Unable to assess  Dorsal Hand No depletion  Patellar Region No depletion  Anterior Thigh Region No depletion  Posterior Calf Region No depletion  Edema (RD Assessment) Mild  Hair Reviewed  Eyes Reviewed  Mouth Reviewed  Skin Reviewed  Nails Reviewed    Diet Order:   Diet Order             Diet Carb Modified Room service appropriate? Yes  Diet effective now                   EDUCATION NEEDS:  No education needs have been identified at this time  Skin:  Skin Assessment: Reviewed RN Assessment  Last BM:  1/16 - type 6  Height:  Ht Readings from Last 1 Encounters:  12/24/24 5' 11 (1.803 m)   Weight:  Wt Readings from Last 1 Encounters:  12/24/24 99.3 kg   Ideal Body Weight:  78.18 kg  BMI:  Body mass index is 30.54 kg/m.  Estimated Nutritional Needs:  Kcal:  1850-2200 kcals Protein:  80-100 grams Fluid:  >/= 1.9L    Trude Ned RD, LDN Contact via Secure Chat.

## 2024-12-25 NOTE — Evaluation (Signed)
 Physical Therapy Evaluation Patient Details Name: Casimir Barcellos. MRN: [id number] DOB: [birth year] Today's Date: 12/25/2024  History of Present Illness  Troye Hiemstra. is a 64 y.o. male presenting to [facility] with fevers and confusion with dx of UTI. EEG -. PMH: hemorrhagic stroke basal ganglia hemorrhage in 2015 resulting in vascular dementia, seizure disorder on Keppra , CAD status post DES 2011, HTN, HLD, CKD 3b, hx of DVT status post IVC filter, neoplasm of left kidney, BPH, history of kidney stones, diastolic CHF, hydrocephalus status post VP shunt in 2015  Clinical Impression  Pt admitted with above diagnosis. PTA, pt reports using RW sometimes, reports working at the work place and that spouse is present, also that he lives in 2 level home with bed/bath upstairs - no family present on eval to verify. On eval, pt with AROM WFL, denies numbness/tingling throughout BLE. Pt comes to sitting at bedside with use of bedrails and elevated HOB with supv. Pt completes transfers with RW from bedside and supv for safety, cues for hand placement. Pt amb 100 ft with RW, CGA for safety, slight bil knee flexion in stance phase, slight knee bounce without knee buckling noted, no overt LOB, cues for maintaining body within RW frame, and increased time with turns. Recommend HHPT with family support at d/c and DME TBD pending home equipment; will continue to follow up and update d/c recs as appropriate. Pt currently with functional limitations due to the deficits listed below (see PT Problem List). Pt will benefit from acute skilled PT to increase their independence and safety with mobility to allow discharge.           If plan is discharge home, recommend the following: A little help with walking and/or transfers;A little help with bathing/dressing/bathroom;Assistance with cooking/housework;Assist for transportation;Help with stairs or ramp for entrance   Can travel by private vehicle        Equipment  Recommendations Other (comment) (TBD pending home DME)  Recommendations for Other Services       Functional Status Assessment Patient has had a recent decline in their functional status and demonstrates the ability to make significant improvements in function in a reasonable and predictable amount of time.     Precautions / Restrictions Precautions Precautions: Fall Recall of Precautions/Restrictions: Impaired Restrictions Weight Bearing Restrictions Per Provider Order: No      Mobility  Bed Mobility Overal bed mobility: Needs Assistance Bed Mobility: Supine to Sit     Supine to sit: Supervision, HOB elevated, Used rails     General bed mobility comments: supv, pt using bedrail and elevated HOB to assist self to bedside    Transfers Overall transfer level: Needs assistance Equipment used: Rolling walker (2 wheels) Transfers: Sit to/from Stand Sit to Stand: Supervision           General transfer comment: cues for hand placement    Ambulation/Gait Ambulation/Gait assistance: Contact guard assist Gait Distance (Feet): 100 Feet Assistive device: Rolling walker (2 wheels) Gait Pattern/deviations: Step-through pattern, Decreased stride length Gait velocity: decreased but functional     General Gait Details: step through gait pattern with slight knee flexion bilaterally in stance phase, slight knee bounce noted without full buckling, cues for maintaining body within RW frame, increasead time and steps with turns, no overt LOB  Stairs            Wheelchair Mobility     Tilt Bed    Modified Rankin (Stroke Patients Only)  Balance Overall balance assessment: Mild deficits observed, not formally tested                                           Pertinent Vitals/Pain Pain Assessment Pain Assessment: No/denies pain    Home Living Family/patient expects to be discharged to:: Private residence Living Arrangements: Spouse/significant  other;Children                 Additional Comments: pt reports living in 2 level home with bed/bath upstairs, reports using RW sometimes, reports he works at the work place, and that his wife is home if needed - no family present on eval    Prior Function Prior Level of Function : Patient poor historian/Family not available             Mobility Comments: pt reports using RW sometimes, denies w/c at home and denies hospital bed at home - no family present on eval       Extremity/Trunk Assessment   Upper Extremity Assessment Upper Extremity Assessment: Defer to OT evaluation    Lower Extremity Assessment Lower Extremity Assessment: Overall WFL for tasks assessed (AROM WFL, strength grossly 3+/5, denies numbness/tingling in BLE)    Cervical / Trunk Assessment Cervical / Trunk Assessment: Normal  Communication   Communication Communication: No apparent difficulties    Cognition Arousal: Alert Behavior During Therapy: WFL for tasks assessed/performed   PT - Cognitive impairments: No family/caregiver present to determine baseline                       PT - Cognition Comments: pt states name and DOB appropriately, reports current date 11/24/98, reports location as the hospital; pt pleasant, follows 1 step commands appropriately Following commands: Intact       Cueing Cueing Techniques: Verbal cues, Visual cues     General Comments General comments (skin integrity, edema, etc.): Pt on RA with Spo2 98% and HR 80s    Exercises     Assessment/Plan    PT Assessment Patient needs continued PT services  PT Problem List Decreased strength;Decreased activity tolerance;Decreased balance;Decreased cognition;Decreased knowledge of use of DME       PT Treatment Interventions DME instruction;Gait training;Functional mobility training;Therapeutic activities;Therapeutic exercise;Balance training;Neuromuscular re-education;Cognitive remediation;Patient/family  education;Wheelchair mobility training;Stair training    PT Goals (Current goals can be found in the Care Plan section)  Acute Rehab PT Goals Patient Stated Goal: pt agreeable to therapy PT Goal Formulation: With patient Time For Goal Achievement: 01/08/25 Potential to Achieve Goals: Good    Frequency Min 3X/week     Co-evaluation               AM-PAC PT 6 Clicks Mobility  Outcome Measure Help needed turning from your back to your side while in a flat bed without using bedrails?: A Little Help needed moving from lying on your back to sitting on the side of a flat bed without using bedrails?: A Little Help needed moving to and from a bed to a chair (including a wheelchair)?: A Little Help needed standing up from a chair using your arms (e.g., wheelchair or bedside chair)?: A Little Help needed to walk in hospital room?: A Little Help needed climbing 3-5 steps with a railing? : A Lot 6 Click Score: 17    End of Session Equipment Utilized During Treatment: Gait belt Activity Tolerance:  Patient tolerated treatment well Patient left: in chair;with call bell/phone within reach;with chair alarm set Nurse Communication: Mobility status PT Visit Diagnosis: Other abnormalities of gait and mobility (R26.89);Muscle weakness (generalized) (M62.81)    Time: 8672-8655 PT Time Calculation (min) (ACUTE ONLY): 17 min   Charges:   PT Evaluation $PT Eval Low Complexity: 1 Low   PT General Charges $$ ACUTE PT VISIT: 1 Visit         Tori Camy Leder PT, DPT 12/25/24, 2:05 PM

## 2024-12-25 NOTE — Evaluation (Signed)
 Clinical/Bedside Swallow Evaluation Patient Details  Name: Tony Davis. MRN: [id number] Date of Birth: [birth year]  Today's Date: 12/25/2024 Time: SLP Start Time (ACUTE ONLY): 1027 SLP Stop Time (ACUTE ONLY): 1035 SLP Time Calculation (min) (ACUTE ONLY): 8 min  Past Medical History:  Past Medical History:  Diagnosis Date   Acute respiratory failure (HCC) 2015   a. Following BG hemorrhage - s/p trache with decannulation.   Basal ganglia hemorrhage (HCC) 2015   a. 2015 - with intraventricular extension and hydrocephalus requiring right crani and VP shunt, complicated by encephalopathy, protein calorie malnutrition, acute resp failure req trache and PEG, BLE DVT s/p IVC, enterobacter PNA, subsequent right hemiparesis and difficulty communicating.   BPH (benign prostatic hyperplasia)    CAD (coronary artery disease)    a. s/p promus DES circumflex artery 10/13/10. b. Cath 04/2013: stable disease but possible small vessel disease -Imdur  added.   Carotid artery disease    a. 1-29% BICA by study 04/2014.   CVA (cerebral vascular accident) (HCC) 02/2010   a. CVA 2011, prior reported TIAs. b. CNS hemorrhage 2015.   CVA (cerebral vascular accident) (HCC) 06/2014   hemorrhagic; in [place] ; brain activity diminshed; ability to do anything; can't do ADLs on own   DVT (deep venous thrombosis) (HCC) 2013   a. 2013. b. 2015 - reported BLE DVT s/p IVC filter.    Enterobacter cloacae pneumonia (HCC) 2015   Falls    Folate deficiency anemia    GERD (gastroesophageal reflux disease)    Hematuria 07/2011   History of kidney stones    HLD (hyperlipidemia)    HTN (hypertension)    Hydrocephalus (HCC) 2015   a. 2/2 BG hemorrhage.   Incontinence of feces    Lower extremity edema    LV dysfunction    a. 2011 - EF 45-50%, subsequent 55-60% in 04/2014.   Noncompliance    Orthostatic hypotension    OSA on CPAP    not using CPAP right now (11/30/2014)   PFO (patent foramen ovale)    a. echo  8/12: EF 55-65%, grade 2 diast dysfnx; +PFO on bubble study. b. TEE 4/13: EF normal, atrial septum with suspicion for interatrial septum fenestrations without flow across and few large bubbles noted in LA.  This was not felt to require closure    Protein calorie malnutrition    Rheumatoid arthritis(714.0)    S/P percutaneous endoscopic gastrostomy (PEG) tube placement (HCC) 2015   TIA (transient ischemic attack)    1-2; both after the one in 02/2010 (09/25/2013)   Urinary incontinence    Past Surgical History:  Past Surgical History:  Procedure Laterality Date   CORONARY ANGIOPLASTY WITH STENT PLACEMENT  12/10/2010   1 (09/24/2013)   CYSTOSCOPY/URETEROSCOPY/HOLMIUM LASER/STENT PLACEMENT Right 07/18/2023   Procedure: CYSTOSCOPY RIGHT URETEROSCOPY, HOLMIUM LASER LITHOTRIPSY, AND RIGHT URETERAL STENT PLACEMENT;  Surgeon: Cam Morene ORN, MD;  Location: [facility] ORS;  Service: Urology;  Laterality: Right;  90 MINUTES   CYSTOSCOPY/URETEROSCOPY/HOLMIUM LASER/STENT PLACEMENT Right 12/17/2024   Procedure: CYSTOSCOPY/URETEROSCOPY/HOLMIUM LASER/STENT PLACEMENT;  Surgeon: Cam Morene ORN, MD;  Location: [facility];  Service: Urology;  Laterality: Right;  CYSTOSCOPY/RIGHT URETEROSCOPY/HOLMIUM LASER/STENT PLACEMENT   filter in place for clots      per wife at preop on 01/11/23   KNEE ARTHROSCOPY Bilateral 08/11/1999   2 on left and 3 on rt   LAPAROSCOPIC NEPHRECTOMY Left 01/18/2023   Procedure: LAPAROSCOPIC RADICAL LEFT NEPHRECTOMY;  Surgeon: Cam Morene ORN, MD;  Location: [facility];  Service: Urology;  Laterality: Left;  210 MINUTES NEDDED FOR CASE   LEFT HEART CATHETERIZATION WITH CORONARY ANGIOGRAM N/A 04/24/2013   Procedure: LEFT HEART CATHETERIZATION WITH CORONARY ANGIOGRAM;  Surgeon: Lonni JONETTA Cash, MD;  Location: [facility] CATH LAB;  Service: Cardiovascular;  Laterality: N/A;   shunt in head      TEE WITHOUT CARDIOVERSION  03/21/2012   Procedure: TRANSESOPHAGEAL ECHOCARDIOGRAM (TEE);  Surgeon:  Vina LULLA Gull, MD;  Location: [facility] ENDOSCOPY;  Service: Cardiovascular;  Laterality: N/A;   HPI:  64 yo male presenting to ED 1/14 with AMS. Admitted with UTI. Pt has a history of cognitive-linguistic deficits and dysphagia. Seen most recently by SLP 09/2023 with recommendations to continue regular/thin diet. PMH: L basal ganglia hemorrhage with intraventricular extension complicated by hydrocephalus s/p shunt (2015), vascular dementia, seizure disorder, OSA on CPAP, CAD s/p DES 2011, HTN, HLD, CKD 3B, DVT s/p IVC filter, neoplasm of L kidney, BPH, diastolic CHF    Assessment / Plan / Recommendation  Clinical Impression  Pt fed himself without overt s/s of dysphagia or aspiration. Oral transit was prompt with no oral residuals. Continue current diet without ongoing SLP f/u. SLP Visit Diagnosis: Dysphagia, unspecified (R13.10)    Aspiration Risk  Mild aspiration risk    Diet Recommendation           Other Recommendations Oral Care Recommendations: Oral care BID     Swallow Evaluation Recommendations Recommendations: PO diet PO Diet Recommendation: Regular;Thin liquids (Level 0) Liquid Administration via: Cup;Straw Medication Administration: Whole meds with liquid Supervision: Patient able to self-feed Swallowing strategies  : Slow rate;Small bites/sips Postural changes: Position pt fully upright for meals Oral care recommendations: Oral care BID (2x/day)   Assistance Recommended at Discharge    Functional Status Assessment Patient has not had a recent decline in their functional status  Frequency and Duration            Prognosis Prognosis for improved oropharyngeal function: Good Barriers to Reach Goals: Cognitive deficits;Time post onset      Swallow Study   General HPI: 64 yo male presenting to ED 1/14 with AMS. Admitted with UTI. Pt has a history of cognitive-linguistic deficits and dysphagia. Seen most recently by SLP 09/2023 with recommendations to continue regular/thin  diet. PMH: L basal ganglia hemorrhage with intraventricular extension complicated by hydrocephalus s/p shunt (2015), vascular dementia, seizure disorder, OSA on CPAP, CAD s/p DES 2011, HTN, HLD, CKD 3B, DVT s/p IVC filter, neoplasm of L kidney, BPH, diastolic CHF Type of Study: Bedside Swallow Evaluation Previous Swallow Assessment: see HPI Diet Prior to this Study: Regular;Thin liquids (Level 0) Temperature Spikes Noted: Yes Respiratory Status: Room air History of Recent Intubation: No Behavior/Cognition: Alert;Cooperative;Pleasant mood Oral Cavity Assessment: Within Functional Limits Oral Care Completed by SLP: No Oral Cavity - Dentition: Adequate natural dentition Vision: Functional for self-feeding Self-Feeding Abilities: Able to feed self Patient Positioning: Upright in chair Baseline Vocal Quality: Normal Volitional Cough: Strong Volitional Swallow: Able to elicit    Oral/Motor/Sensory Function Overall Oral Motor/Sensory Function: Within functional limits   Ice Chips Ice chips: Not tested   Thin Liquid Thin Liquid: Within functional limits Presentation: Straw;Self Fed    Nectar Thick Nectar Thick Liquid: Not tested   Honey Thick Honey Thick Liquid: Not tested   Puree Puree: Not tested   Solid     Solid: Within functional limits Presentation: Self Fed      Damien Blumenthal, M.A., CCC-SLP Speech Language Pathology, Acute Rehabilitation Services  Secure Chat preferred [phone]  12/25/2024,11:09 AM

## 2024-12-25 NOTE — Evaluation (Signed)
 Occupational Therapy Evaluation Patient Details Name: Tony Davis. MRN: [id number] DOB: Jul 20, 1961 Today's Date: 12/25/2024   History of Present Illness   Tony Davis. is a 64 y.o. male presenting to [facility] with fevers and confusion with dx of UTI. EEG -. PMH: hemorrhagic stroke basal ganglia hemorrhage in 2015 resulting in vascular dementia, seizure disorder on Keppra , CAD status post DES 2011, HTN, HLD, CKD 3b, hx of DVT status post IVC filter, neoplasm of left kidney, BPH, history of kidney stones, diastolic CHF, hydrocephalus status post VP shunt in 2015     Clinical Impressions PTA, patient lives at home with family and hired pca support as per chart review as patient unreliable historian. Currently, patient presents with deficits outlined below (see OT Problem List for details) most significantly decreased cognition, balance, and activity tolerance limiting BADL's and functional mobility performance. Recommending HHOT services with family assist and support upon acute hospital discharge. Patient requires continued Acute care hospital level OT services to progress safety and functional performance and allow for discharge.       If plan is discharge home, recommend the following:   A lot of help with walking and/or transfers;A lot of help with bathing/dressing/bathroom;Assistance with cooking/housework;Direct supervision/assist for medications management;Direct supervision/assist for financial management;Assist for transportation;Supervision due to cognitive status;Help with stairs or ramp for entrance     Functional Status Assessment   Patient has had a recent decline in their functional status and demonstrates the ability to make significant improvements in function in a reasonable and predictable amount of time.     Equipment Recommendations   Other (comment)      Precautions/Restrictions   Precautions Precautions: Fall Recall of Precautions/Restrictions:  Impaired Restrictions Weight Bearing Restrictions Per Provider Order: No     Mobility Bed Mobility Overal bed mobility:  (patient in recliner and remained post session)                  Transfers Overall transfer level: Needs assistance Equipment used: Rolling walker (2 wheels) Transfers: Sit to/from Stand, Bed to chair/wheelchair/BSC Sit to Stand: Supervision     Step pivot transfers: Min assist     General transfer comment: min cues for safety, hand placement      Balance Overall balance assessment: Mild deficits observed, not formally tested                                         ADL either performed or assessed with clinical judgement   ADL Overall ADL's : Needs assistance/impaired Eating/Feeding: Set up;Sitting   Grooming: Wash/dry hands;Wash/dry face;Oral care;Contact guard assist;Standing Grooming Details (indicate cue type and reason): stood for grooming with RW unilateral support Upper Body Bathing: Minimal assistance;Sitting   Lower Body Bathing: Moderate assistance;Sit to/from stand   Upper Body Dressing : Minimal assistance;Sitting   Lower Body Dressing: Moderate assistance;Sit to/from stand;Cueing for safety;Cueing for sequencing   Toilet Transfer: Minimal assistance;BSC/3in1;Rolling walker (2 wheels)   Toileting- Clothing Manipulation and Hygiene: Moderate assistance;Sitting/lateral lean Toileting - Clothing Manipulation Details (indicate cue type and reason): using purewick presently     Functional mobility during ADLs: Minimal assistance;Rolling walker (2 wheels);Cueing for safety General ADL Comments: decreased LB reach and cues for all safety     Vision Ability to See in Adequate Light: 0 Adequate Patient Visual Report: No change from baseline Additional Comments: decreased occular alignment but appeared grossly  functional and during gross VF test Bly            Pertinent Vitals/Pain Pain Assessment Pain  Assessment: Faces Faces Pain Scale: No hurt Breathing: normal Negative Vocalization: none Facial Expression: smiling or inexpressive Body Language: relaxed Consolability: no need to console PAINAD Score: 0     Extremity/Trunk Assessment Upper Extremity Assessment Upper Extremity Assessment: Overall WFL for tasks assessed;Right hand dominant;RUE deficits/detail (hx of 3rd digit fracture patient reports with fixed flexion deformity)   Lower Extremity Assessment Lower Extremity Assessment: Defer to PT evaluation   Cervical / Trunk Assessment Cervical / Trunk Assessment: Normal   Communication Communication Communication: No apparent difficulties   Cognition Arousal: Alert Behavior During Therapy: WFL for tasks assessed/performed Cognition: History of cognitive impairments             OT - Cognition Comments: alert, )x1-2, decreased STM, insight, safety, problem solving and sequencing                 Following commands: Intact       Cueing  General Comments   Cueing Techniques: Verbal cues;Visual cues  VSS on RA, no skin issues noted           Home Living Family/patient expects to be discharged to:: Private residence Living Arrangements: Spouse/significant other;Children Available Help at Discharge: Family;Available PRN/intermittently Type of Home: House Home Access: Stairs to enter [employer] of Steps: 4 Entrance Stairs-Rails: Can reach both Home Layout: One level     Bathroom Shower/Tub: Chief Strategy Officer: Standard Bathroom Accessibility: Yes   Home Equipment: Agricultural Consultant (2 wheels);Cane - single point;Shower seat;Grab bars - tub/shower;Hand held shower head;BSC/3in1   Additional Comments: questionable historian: pt reports living in 2 level home with bed/bath upstairs, reports using RW sometimes, reports he works at the work place, and that his wife is home if needed - no family present on eval      Prior  Functioning/Environment Prior Level of Function : Patient poor historian/Family not available             Mobility Comments: pt reports using RW sometimes, denies w/c at home and denies hospital bed at home - no family present on eval ADLs Comments: pt able to feed and groom  self but requires assist for dressing/bathing, food prep    OT Problem List: Impaired balance (sitting and/or standing);Decreased activity tolerance;Decreased safety awareness;Decreased cognition   OT Treatment/Interventions: Self-care/ADL training;Therapeutic exercise;Neuromuscular education;Energy conservation;DME and/or AE instruction;Therapeutic activities;Cognitive remediation/compensation;Balance training;Patient/family education      OT Goals(Current goals can be found in the care plan section)   Acute Rehab OT Goals Patient Stated Goal: to get better OT Goal Formulation: With patient Time For Goal Achievement: 01/08/25 Potential to Achieve Goals: Fair ADL Goals Pt Will Perform Lower Body Bathing: with contact guard assist;sit to/from stand Pt Will Perform Lower Body Dressing: with contact guard assist;sit to/from stand Pt Will Transfer to Toilet: with contact guard assist;regular height toilet;ambulating Pt Will Perform Toileting - Clothing Manipulation and hygiene: with min assist;sit to/from stand Pt Will Perform Tub/Shower Transfer: with min assist;rolling walker;tub bench   OT Frequency:  Min 2X/week       AM-PAC OT 6 Clicks Daily Activity     Outcome Measure Help from another person eating meals?: A Little Help from another person taking care of personal grooming?: A Little Help from another person toileting, which includes using toliet, bedpan, or urinal?: A Little Help from another person bathing (including washing,  rinsing, drying)?: A Little Help from another person to put on and taking off regular upper body clothing?: A Little Help from another person to put on and taking off  regular lower body clothing?: A Little 6 Click Score: 18   End of Session Equipment Utilized During Treatment: Gait belt;Rolling walker (2 wheels) Nurse Communication: Mobility status  Activity Tolerance: Patient tolerated treatment well Patient left: in chair;with call bell/phone within reach;with chair alarm set  OT Visit Diagnosis: Unsteadiness on feet (R26.81);Cognitive communication deficit (R41.841)                Time: 8545-8484 OT Time Calculation (min): 21 min Charges:  OT General Charges $OT Visit: 1 Visit OT Evaluation $OT Eval Low Complexity: 1 Low  Alam Guterrez OT/L Acute Rehabilitation Department  [phone]  12/25/2024, 3:36 PM

## 2024-12-25 NOTE — Progress Notes (Signed)
 Patient's renal function improving, labs suggest pre-renal etiology of his acute worsening failure.  Wife states confusion and lethargy started prior to removing his stent.  Urine consistent with stent, but not specifically of UTI.  Maybe he has pyelo, but would definitely look for alternate causes.  Suspect that inflammation and hydro will resolve quickly.  He is making urine - so he's clearly draining.  Signing off, but appreciate your help with him.  Suspect quick resolution of his symptoms.  He has f/u with us  in 6 weeks with a renal u/s prior.

## 2024-12-25 NOTE — TOC Initial Note (Addendum)
 Transition of Care (TOC) - Initial/Assessment Note   Patient Details  Name: Tony Davis. MRN: [id number] Date of Birth: 02-Feb-1961  Transition of Care [facility]) CM/SW Contact:    Duwaine GORMAN Aran, LCSW Phone Number: 12/25/2024, 12:04 PM  Clinical Narrative: Patient is from home with wife. Patient has DME at home (rollator, walker, shower seat, BSC, hospital bed). PT/OT consulted. Care management awaiting recommendations.  Addendum: PT evaluation recommended HHPT. CSW spoke with Clarita [phone]) with [facility] Per Clarita, [facility] will provide [facility] and orders will not be needed. Patient's spouse will transport the patient home tomorrow if medically ready for discharge. Clarita requested that [facility] be called [phone]) by IP CM at discharge.  Expected Discharge Plan:  (TBD) Barriers to Discharge: Continued Medical Work up  Expected Discharge Plan and Services In-house Referral: Clinical Social Work Living arrangements for the past 2 months: Single Family Home  Prior Living Arrangements/Services Living arrangements for the past 2 months: Single Family Home Lives with:: Spouse Patient language and need for interpreter reviewed:: Yes Do you feel safe going back to the place where you live?: Yes      Need for Family Participation in Patient Care: Yes (Comment) (Patient oriented x2 and has vascular dementia.) Care giver support system in place?: Yes (comment) Current home services: DME (Rollator, walker, shower seat, BSC, hospital bed) Criminal Activity/Legal Involvement Pertinent to Current Situation/Hospitalization: No - Comment as needed  Activities of Daily Living ADL Screening (condition at time of admission) Independently performs ADLs?: No Does the patient have a NEW difficulty with bathing/dressing/toileting/self-feeding that is expected to last >3 days?: No Does the patient have a NEW difficulty with getting in/out of bed, walking, or climbing stairs that is expected to last >3 days?:  No Does the patient have a NEW difficulty with communication that is expected to last >3 days?: No Is the patient deaf or have difficulty hearing?: No Does the patient have difficulty seeing, even when wearing glasses/contacts?: No Does the patient have difficulty concentrating, remembering, or making decisions?: Yes  Emotional Assessment Orientation: : Oriented to Self, Oriented to Place Alcohol / Substance Use: Not Applicable Psych Involvement: No (comment)  Admission diagnosis:  Hypokalemia [E87.6] AKI (acute kidney injury) [N17.9] Sepsis (HCC) [A41.9] Sepsis, due to unspecified organism, unspecified whether acute organ dysfunction present (HCC) [A41.9] Acute encephalopathy due to infection [G93.49, B99.9] Patient Active Problem List   Diagnosis Date Noted   Hypomagnesemia 12/24/2024   Hypophosphatemia 12/24/2024   AKI (acute kidney injury) 12/24/2024   QT prolongation 12/24/2024   Sepsis (HCC) 12/23/2024   Seizure (HCC) 07/12/2024   Vascular dementia (HCC) 07/11/2024   Chronic kidney disease, stage 3a (HCC) 07/11/2024   Cerebellar hemorrhage (HCC) 09/19/2023   Left renal mass 01/18/2023   Obesity (BMI 30-39.9) 04/21/2019   Altered mental status 09/20/2018   Hypokalemia 12/26/2015   Depression 02/03/2015   UTI (urinary tract infection) 11/30/2014   Diastolic dysfunction with chronic heart failure (HCC) 11/30/2014   Essential hypertension 11/03/2014   LV dysfunction    Hydrocephalus (HCC)    Basal ganglia hemorrhage (HCC) 07/30/2014   Routine general medical examination at a health care facility 02/11/2014   Insomnia, persistent 03/25/2013   Chronic rhinitis 03/25/2013   History of hemorrhagic stroke with residual hemiparesis (HCC) 03/20/2013   Folate deficiency anemia 10/13/2012   Long term (current) use of anticoagulants 03/28/2012   Obstructive sleep apnea 08/16/2011   Prediabetes 08/06/2011   PATENT FORAMEN OVALE 03/13/2010   CAD, NATIVE VESSEL 01/26/2010  Hyperlipidemia with target LDL less than 100 01/23/2010   GERD 01/23/2010   PCP:  [name], DO Pharmacy:  No Pharmacies Listed  Social Drivers of Health (SDOH) Social History: SDOH Screenings   Food Insecurity: No Food Insecurity (12/24/2024)  Housing: Low Risk (12/24/2024)  Transportation Needs: No Transportation Needs (12/24/2024)  Utilities: Not At Risk (12/24/2024)  Tobacco Use: High Risk (12/17/2024)   SDOH Interventions:    Readmission Risk Interventions    12/25/2024   12:01 PM  Readmission Risk Prevention Plan  Transportation Screening Complete  HRI or Home Care Consult Complete  Social Work Consult for Recovery Care Planning/Counseling Complete  Palliative Care Screening Not Applicable  Medication Review Oceanographer) Complete

## 2024-12-26 ENCOUNTER — Encounter (HOSPITAL_COMMUNITY): Payer: Self-pay | Admitting: Internal Medicine

## 2024-12-26 ENCOUNTER — Other Ambulatory Visit (HOSPITAL_COMMUNITY): Payer: Self-pay

## 2024-12-26 LAB — URINE CULTURE: Culture: >=100000 — AB

## 2024-12-26 LAB — BASIC METABOLIC PANEL WITH GFR
Anion gap: 9 (ref 5–15)
BUN: 26 mg/dL — ABNORMAL HIGH (ref 8–23)
CO2: 27 mmol/L (ref 22–32)
Calcium: 9.1 mg/dL (ref 8.9–10.3)
Chloride: 104 mmol/L (ref 98–111)
Creatinine, Ser: 2.24 mg/dL — ABNORMAL HIGH (ref 0.61–1.24)
GFR, Estimated: 33 mL/min — ABNORMAL LOW
Glucose, Bld: 105 mg/dL — ABNORMAL HIGH (ref 70–99)
Potassium: 3.7 mmol/L (ref 3.5–5.1)
Sodium: 139 mmol/L (ref 135–145)

## 2024-12-26 MED ORDER — AMOXICILLIN 500 MG PO CAPS
500.0000 mg | ORAL_CAPSULE | Freq: Two times a day (BID) | ORAL | 0 refills | Status: AC
  Filled 2024-12-26: qty 10, 5d supply, fill #0

## 2024-12-26 NOTE — Progress Notes (Signed)
 Discharge meds in a secure bag delivered to patient by this RN

## 2024-12-26 NOTE — Progress Notes (Signed)
 Tony Davis. to be D/C'd Home with home health per MD order.  Discussed with the patient and all questions fully answered.  IV catheter discontinued intact. Site without signs and symptoms of complications. Dressing and pressure applied.  An After Visit Summary was printed and given to the patient's wife. Patient waiting to receive prescription from pharmacy.  D/c education completed with patient/family including follow up instructions, medication list, d/c activities limitations if indicated, with other d/c instructions as indicated by MD - patient able to verbalize understanding, all questions fully answered.   Patient wife at bedside to transport home once medication has been received.  Tony Davis 12/26/2024 12:54 PM

## 2024-12-26 NOTE — Discharge Instructions (Signed)
 Try to avoid aspirin , Motrin and Naproxen until you kidney numbers come back to normal  You were cared for by a hospitalist during your hospital stay. Please review all of you discharge paperwork on the day of discharge and be sure you have all of your prescribed medications and please read the below instructions:  Once you are discharged, your primary care physician will handle any further medical issues. Please note that NO REFILLS for any discharge medications will be authorized once you are discharged as it is imperative that you return to your primary care physician (or establish a relationship with a primary care physician if you do not have one) for your aftercare needs. Please obtain a follow up appointment with your primary care physician within 1-2 weeks of discharge. Please take all your medications with you for your next visit with your Primary MD. Please request your Primary MD to go over all Hospital Tests and Procedures, Radiological results at the follow up appointment. In some cases, there will be blood work, cultures and biopsy results pending at the time of your discharge. Please request that your primary care M.D. goes through all the records of your hospital data and follows up on these results. Please get all hospital records sent to your primary MD by signing hospital release before you go home or request your primary care doctor's office to assist with obtaining medical records.   You must read complete instructions/literature along with all the possible adverse reactions/side effects for all the medicines that have been prescribed to you. Take any new medicines after you have completely understood and accpet all the possible adverse reactions/side effects.  Please take medications as prescribed and speak with your doctor if changes are needed.   If you have smoked or chewed tobacco in the last 2 yrs please stop. Stop any regular alcohol  and or any recreational drug  use. Wear Seat belts while driving.   If you had Pneumonia at the Hospital: Please get a 2 view Chest X ray done in 6-8 weeks after hospital discharge or sooner if instructed by your Primary MD.   If you have Congestive Heart Failure: Follow a cardiac low salt diet and 1.5 lit/day fluid restriction. Please call your Cardiologist or Primary MD anytime you have any of the following symptoms:  1) 3 pound weight gain in 24 hours or 5 pounds in 1 week  2) shortness of breath, with or without a dry hacking cough  3) increasing swelling in the feet or stomach  4) if you have to sleep on extra pillows at night in order to breathe   If you have Diabetes: Check blood sugars 4 times/day- once on AM empty stomach and then before each meal. Log in all results and show them to your primary doctor at your next visit. If glucose readings are often under 60 or above 400 call your primary MD to see if medication dosages need to be adjusted   If you have Syncope (passing out) or Seizure/Convulsions/Epilepsy: Please do not drive, operate heavy machinery, participate in activities at heights or participate in high speed sports until you have seen by Primary MD or a Neurologist and advised to do so again. Per [place]  DMV statutes, patients with seizures are not allowed to drive until they have been seizure-free for six months.  Use caution when using heavy equipment or power tools. Avoid working on ladders or at heights. Take showers instead of baths. Ensure the water  temperature is not  too high on the home water  heater. Do not go swimming alone. Do not lock yourself in a room alone (i.e. bathroom). When caring for infants or small children, sit down when holding, feeding, or changing them to minimize risk of injury to the child in the event you have a seizure. Maintain good sleep hygiene. Avoid alcohol.    If you had Gastrointestinal Bleeding: Please ask your Primary MD to check a complete blood count  within one week of discharge or at your next visit. Your endoscopic/colonoscopic biopsies that are pending at the time of discharge will also need to followed by your Primary MD.    Rosine can reach the hospitalist office at phone [phone] or fax [phone]   If you do not have a primary care physician, you can call [phone] for a physician referral.

## 2024-12-26 NOTE — Discharge Summary (Signed)
 Physician Discharge Summary  Tony Davis. FMW:[id number] DOB: 1961-09-28 DOA: 12/23/2024  PCP: [name], DO  Admit date: 12/23/2024 Discharge date: 12/26/2024 Discharging to: home Recommendations for Outpatient Follow-up:  Please obtain Bmet   Consults:  Urology     Discharge Diagnoses:   Principal Problem:   Sepsis (HCC) Active Problems:   Seizure (HCC)   Hyperlipidemia with target LDL less than 100   CAD, NATIVE VESSEL   Essential hypertension   UTI (urinary tract infection)   Diastolic dysfunction with chronic heart failure (HCC)   Hypokalemia   Altered mental status   Vascular dementia (HCC)   Chronic kidney disease, stage 3a (HCC)   Hypomagnesemia   Hypophosphatemia   AKI (acute kidney injury)   QT prolongation     Brief hospital course: 64 year old male with significant past medical history including hemorrhagic basal ganglia stroke (2015) with residual vascular dementia, seizure disorder, CAD s/p DES (2011), CKD stage IIIb, left nephrectomy, right ureteral stent placed 12/17/24 and removed earlier today, diastolic CHF, and VP shunt who presents with acute worsening confusion.  Per family, at baseline patient recognizes family, ambulates with walker, and can express needs. Symptoms started prior to stent removal.  Over the last 24 hours, he developed progressive confusion, decreased responsiveness, poor oral intake, and felt warm to touch. [facility] evaluated by [facility] without clear etiology. Earlier today, he was seen by urology and had his right ureteral stent removed, after which confusion worsened. Family reports a history of non-tonic-clonic (absence-like) seizures and were concerned about seizure activity.  In the ED, patient was febrile to 102.87F, tachypneic, leukocytic, and had AKI (Cr 3.46 from baseline ~1.6) with lactic acidosis (2.6). CT abdomen/pelvis showed infectious cystitis with ascending UTI/pyelonephritis in the setting of non-obstructing right  renal calculi. He has only one kidney (s/p left nephrectomy). UA consistent with UTI.   Subjective:   States he feels good and would like to go home but not sure why he is in the hospital.   Assessment and Plan:  Principal Problem:  Sepsis related to Pyelonephritis in setting of ureteral stent - Urine growing greater than 100,000 colonies of Proteus mirabilis -  Transitioned to amoxil  based on sensitivities   Active Problems: Acute metabolic encephalopathy with h/o vascular dementia - due to above - poorly responsive initially but subsequently alert and communicative - back to baseline per wife - takes Namenda  and Exelon  at baseline      Seizure disorder - cont Keppra    AKI with CKD stage 3a, dehydrated Left sided nephrectomy - Cr 3.46 when admitted- baseline is about 1.5 - sOsm elevated at 301 - ACE I on hold - improved to 2.24 on dc- expect it will continue to improve   Hypokalemia, hypomagnesemia, hypophosphatemia - K 3.3 - Phos 1.6 - Mg 1.6 - replaced   HTN - cont Hydralazine  and Toprol  - Lisinopril  on hold      QT prolongation - QT 492- repeat EKG revealed improvement to 445         Discharge Instructions   Allergies as of 12/26/2024       Reactions   Iodine Anaphylaxis   Norvasc  [amlodipine ] Swelling, Other (See Comments)   Foot edema   Aricept [donepezil] Other (See Comments)   Hallucinations        Medication List     STOP taking these medications    lisinopril  40 MG tablet Commonly known as: ZESTRIL        TAKE these medications  acetaminophen  325 MG tablet Commonly known as: TYLENOL  Take 650 mg by mouth every 6 (six) hours as needed for moderate pain.   amoxicillin  500 MG tablet Commonly known as: AMOXIL  Take 1 tablet (500 mg total) by mouth 2 (two) times daily for 5 days.   atorvastatin  80 MG tablet Commonly known as: LIPITOR  TAKE ONE TABLET BY MOUTH DAILY   Denta 5000 Plus 1.1 % Crea dental cream Generic drug: sodium  fluoride Place 1 Application onto teeth every evening.   folic acid  1 MG tablet Commonly known as: FOLVITE  Take 1 tablet (1 mg total) by mouth daily.   hydrALAZINE  100 MG tablet Commonly known as: APRESOLINE  Take 100 mg by mouth 2 (two) times daily.   isosorbide  mononitrate 30 MG 24 hr tablet Commonly known as: IMDUR  Take 0.5 tablets (15 mg total) by mouth daily.   levETIRAcetam  500 MG tablet Commonly known as: KEPPRA  Take 1 tablet (500 mg total) by mouth 2 (two) times daily.   melatonin 5 MG Tabs Take 5 mg by mouth at bedtime.   memantine  10 MG tablet Commonly known as: NAMENDA  Take 10 mg by mouth 2 (two) times daily.   metoprolol  succinate 100 MG 24 hr tablet Commonly known as: TOPROL -XL Take 1 tablet (100 mg total) by mouth 2 (two) times daily. Take with or immediately following a meal.   pantoprazole  40 MG tablet Commonly known as: PROTONIX  Take 1 tablet (40 mg total) by mouth daily. What changed: when to take this   polyethylene glycol powder 17 GM/SCOOP powder Commonly known as: GLYCOLAX/MIRALAX Take 17 g by mouth 2 (two) times daily as needed for mild constipation or moderate constipation (Dissolve in 4-8 ounces of liquid). Dissolve 1 capful (17g) in 4-8 ounces of liquid   rivastigmine  6 MG capsule Commonly known as: EXELON  Take 6 mg by mouth 2 (two) times daily.   tamsulosin  0.4 MG Caps capsule Commonly known as: FLOMAX  Take 1 capsule (0.4 mg total) by mouth daily.   traMADol  50 MG tablet Commonly known as: Ultram  Take 1-2 tablets (50-100 mg total) by mouth every 6 (six) hours as needed for moderate pain (pain score 4-6).   Vitamin D3 25 MCG (1000 UT) Caps Take 1,000 Units by mouth daily.        Follow-up Information     Reed, [name] L, DO. Schedule an appointment as soon as possible for a visit in 1 week(s).   Specialty: Geriatric Medicine Why: to check bmet Contact information: [address] [place] [address] [phone]                     The results of significant diagnostics from this hospitalization (including imaging, microbiology, ancillary and laboratory) are listed below for reference.    EEG adult Result Date: 12/24/2024 Shelton Arlin KIDD, MD     12/24/2024  2:54 PM Patient Name: Tony Davis. MRN: [id number] Epilepsy Attending: Arlin KIDD Shelton Referring Physician/Provider: Doutova, Anastassia, MD Date: 12/24/2024 Duration: 23.05 mins Patient history: 64yo M with ams. EEG to evaluate for seizure Level of alertness: Awake AEDs during EEG study: LEV Technical aspects: This EEG study was done with scalp electrodes positioned according to the 10-20 International system of electrode placement. Electrical activity was reviewed with band pass filter of 1-70Hz , sensitivity of 7 uV/mm, display speed of 52mm/sec with a 60Hz  notched filter applied as appropriate. EEG data were recorded continuously and digitally stored.  Video monitoring was available and reviewed as appropriate. Description:  The posterior dominant rhythm consists of 7.5Hz  activity of moderate voltage (25-35 uV) seen predominantly in posterior head regions, symmetric and reactive to eye opening and eye closing. Hyperventilation and photic stimulation were not performed.   IMPRESSION: This study is within normal limits. No seizures or epileptiform discharges were seen throughout the recording. A normal interictal EEG does not exclude the diagnosis of epilepsy. Priyanka MALVA Krebs   CT HEAD WO CONTRAST ( ) Result Date: 12/24/2024 CLINICAL DATA:  Fever and weakness EXAM: CT HEAD WITHOUT CONTRAST TECHNIQUE: Contiguous axial images were obtained from the base of the skull through the vertex without intravenous contrast. RADIATION DOSE REDUCTION: This exam was performed according to the departmental dose-optimization program which includes automated exposure control, adjustment of the mA and/or kV according to patient size and/or use of iterative reconstruction  technique. COMPARISON:  09/01/2024 FINDINGS: Brain: No evidence of acute infarction, hemorrhage, hydrocephalus, extra-axial collection or mass lesion/mass effect. Right frontal ventriculostomy catheter is again seen and stable. Scattered hypodensities are noted in the deep white matter. Vascular: No hyperdense vessel or unexpected calcification. Skull: Normal. Negative for fracture or focal lesion. Sinuses/Orbits: No acute finding. Other: None. IMPRESSION: Chronic changes without acute abnormality. Electronically Signed   By: Oneil Devonshire M.D.   On: 12/24/2024 02:46   CT Renal Stone Study Result Date: 12/23/2024 EXAM: CT ABDOMEN AND PELVIS WITHOUT CONTRAST 12/23/2024 09:51:40 PM TECHNIQUE: CT of the abdomen and pelvis was performed without the administration of intravenous contrast. Multiplanar reformatted images are provided for review. Automated exposure control, iterative reconstruction, and/or weight-based adjustment of the mA/kV was utilized to reduce the radiation dose to as low as reasonably achievable. COMPARISON: 11/19/2024 CLINICAL HISTORY: Abdominal/flank pain, stone suspected. Weakness, anorexia, altered mental status. FINDINGS: LOWER CHEST: Ventricular peritoneal shunt catheter tube can be seen with its tip within the right anterior subdiaphragmatic region. Stable small pericardial effusion. LIVER: The liver is unremarkable. GALLBLADDER AND BILE DUCTS: Gallbladder is unremarkable. No biliary ductal dilatation. SPLEEN: No acute abnormality. PANCREAS: No acute abnormality. ADRENAL GLANDS: No acute abnormality. KIDNEYS, URETERS AND BLADDER: Right kidney: 8.6 cm simple cortical cyst within the interpolar region and 7.2 cm simple cortical cyst within the lower pole for which no follow up imaging is recommended. Multiple nonobstructing calculi are seen within the right kidney measuring up to 6 mm. No hydronephrosis. There is mild right perinephric and moderate periureteric inflammatory stranding. No  perinephric fluid collection. Left kidney: Status post left nephrectomy. Bladder: Largely decompressed. There is, however, circumferential bladder wall thickening and extensive perivesicular inflammatory stranding suggesting a superimposed infectious or inflammatory cystitis. Together, the findings are most suggestive of infectious cystitis and ascending urinary tract infection / pyelonephritis. GI AND BOWEL: Stomach demonstrates no acute abnormality. There is no bowel obstruction. Tiny fat-containing umbilical hernia. PERITONEUM AND RETROPERITONEUM: No ascites. No free air. VASCULATURE: Inferior vena cava filter is in place. Relatively diminutive caliber of the iliocaval venous vasculature and extensive body wall venous collaterals suggest chronic caval thrombosis, though this is not well assessed on this noncontrast examination. Mild aortoiliac atherosclerotic calcification. No aortic aneurysm. LYMPH NODES: No lymphadenopathy. REPRODUCTIVE ORGANS: No acute abnormality. BONES AND SOFT TISSUES: No acute osseous abnormality. No focal soft tissue abnormality. IMPRESSION: 1. Findings most suggestive of infectious cystitis with ascending urinary tract infection/pyelonephritis in the setting of multiple nonobstructing right renal calculi. No hydronephrosis. No perinephric fluid collection. 2. Status post left nephrectomy. 3. Ventriculoperitoneal shunt catheter with tip in the right anterior subdiaphragmatic region. 4. Stable small pericardial effusion. 5. Inferior  vena cava filter in place with extensive body wall venous collaterals suggesting chronic caval thrombosis. 6. RAF score includes aortic atherosclerosis (ICD10-I70.0). Electronically signed by: Dorethia Molt MD 12/23/2024 10:01 PM EST RP Workstation: [id number]   DG Chest Port 1 View Result Date: 12/23/2024 EXAM: 1 VIEW(S) XRAY OF THE CHEST 12/23/2024 07:55:00 PM COMPARISON: 09/01/2024 CLINICAL HISTORY: Questionable sepsis - evaluate for abnormality FINDINGS:  LINES, TUBES AND DEVICES: Right VP shunt tubing partially visible. LUNGS AND PLEURA: Low lung volumes. No focal pulmonary opacity. No pleural effusion. No pneumothorax. HEART AND MEDIASTINUM: No acute abnormality of the cardiac and mediastinal silhouettes. BONES AND SOFT TISSUES: No acute osseous abnormality. IMPRESSION: 1. No acute findings. Electronically signed by: Oneil Devonshire MD 12/23/2024 07:58 PM EST RP Workstation: [id number] C-Arm 1-60 Min-No Report Result Date: 12/17/2024 Fluoroscopy was utilized by the requesting physician.  No radiographic interpretation.   DG C-Arm 1-60 Min-No Report Result Date: 12/17/2024 Fluoroscopy was utilized by the requesting physician.  No radiographic interpretation.   Labs:   Basic Metabolic Panel: Recent Labs  Lab 12/23/24 1950 12/23/24 2333 12/24/24 0333 12/25/24 0042 12/25/24 0415 12/26/24 0343  NA 138  --  140 137 137 139  K 3.1*  --  3.3* 3.6 3.6 3.7  CL 99  --  101 100 101 104  CO2 25  --  30 26 26 27   GLUCOSE 140*  --  112* 120* 125* 105*  BUN 32*  --  33* 29* 27* 26*  CREATININE 3.46*  --  3.42* 2.65* 2.41* 2.24*  CALCIUM  9.6  --  10.2 8.8* 8.6* 9.1  MG  --  1.6*  --   --  1.8  --   PHOS  --  1.8*  --  2.7 2.5  --      CBC: Recent Labs  Lab 12/23/24 1950 12/24/24 0925 12/25/24 0042 12/25/24 0415  WBC 15.8* 13.7* 8.6 7.9  NEUTROABS 13.2*  --   --   --   HGB 14.2 12.7* 12.7* 11.6*  HCT 43.9 39.7 37.3* 34.5*  MCV 93.8 94.3 91.6 90.6  PLT 197 164 156 161         SIGNED:   True Atlas, MD  [facility] 12/26/2024, 12:24 PM Time taking on discharge: 50 minutes

## 2024-12-29 LAB — CULTURE, BLOOD (ROUTINE X 2)
Culture: NO GROWTH
Culture: NO GROWTH
Special Requests: ADEQUATE
Special Requests: ADEQUATE

## 2025-01-01 ENCOUNTER — Other Ambulatory Visit: Payer: Self-pay | Admitting: *Deleted

## 2025-01-01 DIAGNOSIS — Z122 Encounter for screening for malignant neoplasm of respiratory organs: Secondary | ICD-10-CM

## 2025-01-01 DIAGNOSIS — Z87891 Personal history of nicotine dependence: Secondary | ICD-10-CM
